# Patient Record
Sex: Male | Born: 1950 | Race: White | Hispanic: No | Marital: Married | Smoking: Former smoker
Health system: Southern US, Community
[De-identification: ages and names within clinical notes are randomized; demographics above are authoritative.]

## PROBLEM LIST (undated history)

## (undated) DIAGNOSIS — M1990 Unspecified osteoarthritis, unspecified site: Secondary | ICD-10-CM

## (undated) DIAGNOSIS — I509 Heart failure, unspecified: Secondary | ICD-10-CM

## (undated) DIAGNOSIS — K2100 Gastro-esophageal reflux disease with esophagitis, without bleeding: Secondary | ICD-10-CM

## (undated) DIAGNOSIS — J449 Chronic obstructive pulmonary disease, unspecified: Secondary | ICD-10-CM

## (undated) DIAGNOSIS — I4892 Unspecified atrial flutter: Secondary | ICD-10-CM

## (undated) DIAGNOSIS — M810 Age-related osteoporosis without current pathological fracture: Secondary | ICD-10-CM

## (undated) DIAGNOSIS — I700 Atherosclerosis of aorta: Secondary | ICD-10-CM

## (undated) DIAGNOSIS — N189 Chronic kidney disease, unspecified: Secondary | ICD-10-CM

## (undated) DIAGNOSIS — N185 Chronic kidney disease, stage 5: Secondary | ICD-10-CM

## (undated) DIAGNOSIS — E785 Hyperlipidemia, unspecified: Secondary | ICD-10-CM

## (undated) DIAGNOSIS — I770 Arteriovenous fistula, acquired: Secondary | ICD-10-CM

## (undated) DIAGNOSIS — I219 Acute myocardial infarction, unspecified: Secondary | ICD-10-CM

## (undated) DIAGNOSIS — I2510 Atherosclerotic heart disease of native coronary artery without angina pectoris: Secondary | ICD-10-CM

## (undated) DIAGNOSIS — Z974 Presence of external hearing-aid: Secondary | ICD-10-CM

## (undated) DIAGNOSIS — E119 Type 2 diabetes mellitus without complications: Secondary | ICD-10-CM

## (undated) DIAGNOSIS — Z950 Presence of cardiac pacemaker: Secondary | ICD-10-CM

## (undated) DIAGNOSIS — R51 Headache: Secondary | ICD-10-CM

## (undated) DIAGNOSIS — J45909 Unspecified asthma, uncomplicated: Secondary | ICD-10-CM

## (undated) DIAGNOSIS — R609 Edema, unspecified: Secondary | ICD-10-CM

## (undated) DIAGNOSIS — R5383 Other fatigue: Secondary | ICD-10-CM

## (undated) DIAGNOSIS — Z973 Presence of spectacles and contact lenses: Secondary | ICD-10-CM

## (undated) DIAGNOSIS — I739 Peripheral vascular disease, unspecified: Secondary | ICD-10-CM

## (undated) DIAGNOSIS — R519 Headache, unspecified: Secondary | ICD-10-CM

## (undated) DIAGNOSIS — D649 Anemia, unspecified: Secondary | ICD-10-CM

## (undated) DIAGNOSIS — I10 Essential (primary) hypertension: Secondary | ICD-10-CM

## (undated) DIAGNOSIS — G4730 Sleep apnea, unspecified: Secondary | ICD-10-CM

## (undated) DIAGNOSIS — N289 Disorder of kidney and ureter, unspecified: Secondary | ICD-10-CM

## (undated) DIAGNOSIS — J189 Pneumonia, unspecified organism: Secondary | ICD-10-CM

## (undated) HISTORY — DX: Hyperlipidemia, unspecified: E78.5

## (undated) HISTORY — PX: COLONOSCOPY W/ BIOPSIES AND POLYPECTOMY: SHX1376

## (undated) HISTORY — PX: BACK SURGERY: SHX140

## (undated) HISTORY — DX: Atherosclerotic heart disease of native coronary artery without angina pectoris: I25.10

## (undated) HISTORY — PX: APPENDECTOMY: SHX54

## (undated) HISTORY — PX: ABDOMINAL SURGERY: SHX537

## (undated) HISTORY — DX: Edema, unspecified: R60.9

## (undated) HISTORY — DX: Morbid (severe) obesity due to excess calories: E66.01

## (undated) HISTORY — PX: INSERT / REPLACE / REMOVE PACEMAKER: SUR710

## (undated) HISTORY — PX: OTHER SURGICAL HISTORY: SHX169

## (undated) HISTORY — PX: HIP ARTHROPLASTY: SHX981

## (undated) HISTORY — PX: TUMOR REMOVAL: SHX12

## (undated) HISTORY — DX: Gastro-esophageal reflux disease with esophagitis, without bleeding: K21.00

## (undated) HISTORY — DX: Unspecified atrial flutter: I48.92

## (undated) HISTORY — PX: GROIN EXPLORATION: SHX1713

## (undated) HISTORY — DX: Type 2 diabetes mellitus without complications: E11.9

## (undated) HISTORY — PX: CARDIAC CATHETERIZATION: SHX172

## (undated) HISTORY — DX: Peripheral vascular disease, unspecified: I73.9

## (undated) HISTORY — PX: CLOSED REDUCTION SHOULDER DISLOCATION: SUR242

## (undated) HISTORY — DX: Chronic kidney disease, unspecified: N18.9

## (undated) HISTORY — PX: CORONARY ANGIOPLASTY: SHX604

## (undated) HISTORY — DX: Essential (primary) hypertension: I10

## (undated) HISTORY — DX: Gastro-esophageal reflux disease with esophagitis: K21.0

## (undated) HISTORY — PX: DIAGNOSTIC LAPAROSCOPY: SUR761

## (undated) HISTORY — DX: Chronic obstructive pulmonary disease, unspecified: J44.9

## (undated) HISTORY — PX: CHOLECYSTECTOMY: SHX55

---

## 1998-03-30 ENCOUNTER — Encounter: Payer: Self-pay | Admitting: Neurosurgery

## 1998-04-06 ENCOUNTER — Inpatient Hospital Stay (HOSPITAL_COMMUNITY): Admission: AD | Admit: 1998-04-06 | Discharge: 1998-04-08 | Payer: Self-pay | Admitting: Cardiovascular Disease

## 1998-09-06 ENCOUNTER — Ambulatory Visit (HOSPITAL_COMMUNITY): Admission: RE | Admit: 1998-09-06 | Discharge: 1998-09-06 | Payer: Self-pay | Admitting: Internal Medicine

## 1998-09-06 ENCOUNTER — Encounter (INDEPENDENT_AMBULATORY_CARE_PROVIDER_SITE_OTHER): Payer: Self-pay | Admitting: Internal Medicine

## 1998-10-13 ENCOUNTER — Ambulatory Visit (HOSPITAL_COMMUNITY): Admission: RE | Admit: 1998-10-13 | Discharge: 1998-10-13 | Payer: Self-pay | Admitting: Gastroenterology

## 1998-10-13 ENCOUNTER — Encounter: Payer: Self-pay | Admitting: Gastroenterology

## 1998-10-25 ENCOUNTER — Inpatient Hospital Stay (HOSPITAL_COMMUNITY): Admission: RE | Admit: 1998-10-25 | Discharge: 1998-10-30 | Payer: Self-pay | Admitting: General Surgery

## 1998-10-26 ENCOUNTER — Encounter: Payer: Self-pay | Admitting: General Surgery

## 2000-06-06 ENCOUNTER — Encounter: Payer: Self-pay | Admitting: Neurosurgery

## 2000-06-10 ENCOUNTER — Inpatient Hospital Stay (HOSPITAL_COMMUNITY): Admission: RE | Admit: 2000-06-10 | Discharge: 2000-06-13 | Payer: Self-pay | Admitting: Neurosurgery

## 2000-06-10 ENCOUNTER — Encounter: Payer: Self-pay | Admitting: Neurosurgery

## 2000-06-20 ENCOUNTER — Encounter: Admission: RE | Admit: 2000-06-20 | Discharge: 2000-06-20 | Payer: Self-pay | Admitting: Neurosurgery

## 2000-06-20 ENCOUNTER — Encounter: Payer: Self-pay | Admitting: Neurosurgery

## 2000-07-10 ENCOUNTER — Emergency Department (HOSPITAL_COMMUNITY): Admission: EM | Admit: 2000-07-10 | Discharge: 2000-07-10 | Payer: Self-pay | Admitting: Internal Medicine

## 2000-07-11 ENCOUNTER — Encounter: Payer: Self-pay | Admitting: Gastroenterology

## 2000-07-11 ENCOUNTER — Encounter: Admission: RE | Admit: 2000-07-11 | Discharge: 2000-07-11 | Payer: Self-pay | Admitting: Gastroenterology

## 2000-07-18 ENCOUNTER — Encounter: Payer: Self-pay | Admitting: General Surgery

## 2000-07-18 ENCOUNTER — Encounter: Admission: RE | Admit: 2000-07-18 | Discharge: 2000-07-18 | Payer: Self-pay | Admitting: General Surgery

## 2001-06-18 ENCOUNTER — Ambulatory Visit (HOSPITAL_COMMUNITY): Admission: RE | Admit: 2001-06-18 | Discharge: 2001-06-18 | Payer: Self-pay | Admitting: *Deleted

## 2002-10-02 ENCOUNTER — Inpatient Hospital Stay (HOSPITAL_COMMUNITY): Admission: EM | Admit: 2002-10-02 | Discharge: 2002-10-04 | Payer: Self-pay | Admitting: Internal Medicine

## 2002-10-03 ENCOUNTER — Encounter: Payer: Self-pay | Admitting: Internal Medicine

## 2002-10-04 ENCOUNTER — Encounter: Payer: Self-pay | Admitting: Internal Medicine

## 2004-08-21 ENCOUNTER — Ambulatory Visit: Payer: Self-pay | Admitting: Cardiology

## 2005-10-03 ENCOUNTER — Ambulatory Visit: Payer: Self-pay | Admitting: Cardiology

## 2006-01-31 ENCOUNTER — Ambulatory Visit: Payer: Self-pay | Admitting: Cardiology

## 2006-02-14 ENCOUNTER — Ambulatory Visit: Payer: Self-pay | Admitting: Cardiology

## 2006-03-11 ENCOUNTER — Ambulatory Visit: Payer: Self-pay | Admitting: Cardiology

## 2006-09-03 ENCOUNTER — Ambulatory Visit: Payer: Self-pay | Admitting: Cardiology

## 2006-10-20 ENCOUNTER — Ambulatory Visit: Payer: Self-pay | Admitting: Cardiology

## 2006-10-28 ENCOUNTER — Encounter: Payer: Self-pay | Admitting: Cardiology

## 2006-10-28 ENCOUNTER — Ambulatory Visit: Payer: Self-pay | Admitting: Cardiology

## 2007-06-04 ENCOUNTER — Encounter: Payer: Self-pay | Admitting: Physician Assistant

## 2007-06-04 ENCOUNTER — Ambulatory Visit: Payer: Self-pay | Admitting: Cardiology

## 2007-09-09 ENCOUNTER — Encounter: Payer: Self-pay | Admitting: Physician Assistant

## 2008-01-08 ENCOUNTER — Ambulatory Visit: Payer: Self-pay | Admitting: Cardiology

## 2008-01-14 ENCOUNTER — Ambulatory Visit: Payer: Self-pay | Admitting: Cardiology

## 2008-02-11 ENCOUNTER — Ambulatory Visit: Payer: Self-pay | Admitting: Cardiology

## 2008-02-19 ENCOUNTER — Ambulatory Visit: Payer: Self-pay | Admitting: Cardiology

## 2008-03-30 ENCOUNTER — Ambulatory Visit: Payer: Self-pay | Admitting: Cardiology

## 2008-03-30 ENCOUNTER — Encounter: Payer: Self-pay | Admitting: Cardiology

## 2008-03-30 DIAGNOSIS — E785 Hyperlipidemia, unspecified: Secondary | ICD-10-CM | POA: Insufficient documentation

## 2008-03-30 DIAGNOSIS — I731 Thromboangiitis obliterans [Buerger's disease]: Secondary | ICD-10-CM | POA: Insufficient documentation

## 2008-03-30 DIAGNOSIS — I2510 Atherosclerotic heart disease of native coronary artery without angina pectoris: Secondary | ICD-10-CM | POA: Insufficient documentation

## 2008-03-30 DIAGNOSIS — E782 Mixed hyperlipidemia: Secondary | ICD-10-CM | POA: Insufficient documentation

## 2008-03-30 DIAGNOSIS — F411 Generalized anxiety disorder: Secondary | ICD-10-CM | POA: Insufficient documentation

## 2008-03-30 DIAGNOSIS — E119 Type 2 diabetes mellitus without complications: Secondary | ICD-10-CM | POA: Insufficient documentation

## 2008-05-20 DIAGNOSIS — K760 Fatty (change of) liver, not elsewhere classified: Secondary | ICD-10-CM

## 2008-05-20 HISTORY — DX: Fatty (change of) liver, not elsewhere classified: K76.0

## 2008-11-17 ENCOUNTER — Ambulatory Visit: Payer: Self-pay | Admitting: Cardiology

## 2008-11-18 ENCOUNTER — Telehealth: Payer: Self-pay | Admitting: Cardiology

## 2008-12-08 ENCOUNTER — Encounter: Admission: RE | Admit: 2008-12-08 | Discharge: 2008-12-08 | Payer: Self-pay | Admitting: Gastroenterology

## 2008-12-26 ENCOUNTER — Encounter: Payer: Self-pay | Admitting: Cardiology

## 2008-12-29 ENCOUNTER — Encounter: Payer: Self-pay | Admitting: Cardiology

## 2009-01-10 ENCOUNTER — Encounter (HOSPITAL_COMMUNITY): Admission: RE | Admit: 2009-01-10 | Discharge: 2009-03-24 | Payer: Self-pay | Admitting: Gastroenterology

## 2009-05-20 ENCOUNTER — Encounter: Payer: Self-pay | Admitting: Cardiology

## 2009-07-05 ENCOUNTER — Ambulatory Visit: Payer: Self-pay | Admitting: Cardiology

## 2009-07-05 DIAGNOSIS — F17200 Nicotine dependence, unspecified, uncomplicated: Secondary | ICD-10-CM | POA: Insufficient documentation

## 2009-07-05 DIAGNOSIS — G4733 Obstructive sleep apnea (adult) (pediatric): Secondary | ICD-10-CM | POA: Insufficient documentation

## 2009-07-05 DIAGNOSIS — J449 Chronic obstructive pulmonary disease, unspecified: Secondary | ICD-10-CM | POA: Insufficient documentation

## 2009-07-19 ENCOUNTER — Encounter: Payer: Self-pay | Admitting: Cardiology

## 2009-07-25 ENCOUNTER — Telehealth (INDEPENDENT_AMBULATORY_CARE_PROVIDER_SITE_OTHER): Payer: Self-pay | Admitting: *Deleted

## 2009-07-30 ENCOUNTER — Encounter: Payer: Self-pay | Admitting: Cardiology

## 2010-02-15 ENCOUNTER — Ambulatory Visit: Payer: Self-pay | Admitting: Cardiology

## 2010-02-15 DIAGNOSIS — R609 Edema, unspecified: Secondary | ICD-10-CM | POA: Insufficient documentation

## 2010-03-28 ENCOUNTER — Encounter (INDEPENDENT_AMBULATORY_CARE_PROVIDER_SITE_OTHER): Payer: Self-pay | Admitting: *Deleted

## 2010-04-10 ENCOUNTER — Telehealth (INDEPENDENT_AMBULATORY_CARE_PROVIDER_SITE_OTHER): Payer: Self-pay | Admitting: *Deleted

## 2010-06-21 NOTE — Assessment & Plan Note (Signed)
Summary: 6 MO FU   Visit Type:  Follow-up Primary Provider:  Dr. Nadara Mustard   History of Present Illness: patient's 60 year old male with history of coronary heart disease, status post stent of the mid RCA in 1999. Had a normal cardiac study 2009. He continues to smoke and is a diabetic. Recently his abscess of the patient's up for nonhealing ulcers of the lower extremities interestingly he has normal arterial blood flow with normal ABIs. He does have severe venous insufficiency although venous Doppler she did not show any thrombosis. Procedure the patient's left leg is very edematous. There appears to be a component of lymphedema also. From a cardiac standpoint he is doing well. He denies any chest pain short of breath orthopnea PND. He has been screened for short sleep apnea and was found to have mild sharp sleep apnea.  Preventive Screening-Counseling & Management  Alcohol-Tobacco     Smoking Status: current     Smoking Cessation Counseling: yes     Packs/Day: 2 PPD  Current Medications (verified): 1)  Clonazepam 0.5 Mg Tabs (Clonazepam) .... Take 1 Tablet By Mouth Two Times A Day 2)  Altace 10 Mg Caps (Ramipril) .... Take 1 Capsule By Mouth Once A Day 3)  Crestor 10 Mg Tabs (Rosuvastatin Calcium) .... Take 1 Tablet By Mouth Once A Day 4)  Norvasc 10 Mg Tabs (Amlodipine Besylate) .... Take 1 Tablet By Mouth Once A Day 5)  Metformin Hcl 500 Mg Tabs (Metformin Hcl) .... Take 1 Tablet By Mouth Once A Day 6)  Lasix 40 Mg Tabs (Furosemide) .... Take 1 Tablet By Mouth Once A Day 7)  Multivitamins  Tabs (Multiple Vitamin) .... Take 1 Tablet By Mouth Once A Day 8)  Aspir-Low 81 Mg Tbec (Aspirin) .... Take 1 Tablet By Mouth Once A Day 9)  Coreg 25 Mg Tabs (Carvedilol) .... Take 1 Tablet By Mouth Two Times A Day 10)  Omeprazole 20 Mg Cpdr (Omeprazole) .... Take 1 Tablet By Mouth Once A Day 11)  Lantus 100 Unit/ml Soln (Insulin Glargine) .... 48u Subcutaneously Daily 12)  Novolog 100 Unit/ml Soln  (Insulin Aspart) .... Sliding Scale  Allergies (verified): No Known Drug Allergies  Comments:  Nurse/Medical Assistant: The patient's medication bottles and allergies were reviewed with the patient and were updated in the Medication and Allergy Lists.  Past History:  Past Medical History: Last updated: 07/05/2009 multivessel coronary artery disease moderate two-vessel coronary disease by catheterization 2003.  Status post stenting of the mid RCA November 1999 normal left ventricular ejection fraction. Normal Cardiolite study last month with a normal ejection fraction.2009 Dyslipidemia type 2 diabetes mellitus hypertension COPD with ongoing tobacco use and the patient failed sham takes. 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  Family History: Last updated: 03/30/2008 noncontributory  Social History: Last updated: 03/30/2008 patient continues to smoke.  Risk Factors: Smoking Status: current (02/15/2010) Packs/Day: 2 PPD (02/15/2010)  Social History: Packs/Day:  2 PPD  Review of Systems       The patient complains of fatigue and leg swelling.  The patient denies malaise, fever, weight gain/loss, vision loss, decreased hearing, hoarseness, chest pain, palpitations, shortness of breath, prolonged cough, wheezing, sleep apnea, coughing up blood, abdominal pain, blood in stool, nausea, vomiting, diarrhea, heartburn, incontinence, blood in urine, muscle weakness, joint pain, rash, skin lesions, headache, fainting, dizziness, depression,  anxiety, enlarged lymph nodes, easy bruising or bleeding, and environmental allergies.    Vital Signs:  Patient profile:   60 year old male Height:      75 inches Weight:      293 pounds Pulse rate:   40 / minute BP sitting:    130 / 81  (left arm) Cuff size:   large  Vitals Entered By: Georgina Peer (February 15, 2010 10:07 AM)  Physical Exam  Additional Exam:  General: Well-developed, well-nourished in no distress head: Normocephalic and atraumatic eyes PERRLA/EOMI intact, conjunctiva and lids normal nose: No deformity or lesions mouth normal dentition, normal posterior pharynx neck: Supple, no JVD.  No masses, thyromegaly or abnormal cervical nodes lungs: Normal breath sounds bilaterally without wheezing.  Normal percussion heart: regular rate and rhythm with normal S1 and S2, no S3 or S4.  PMI is normal.  No pathological murmurs abdomen: Normal bowel sounds, abdomen is soft and nontender without masses, organomegaly or hernias noted.  No hepatosplenomegaly musculoskeletal: Back normal, normal gait muscle strength and tone normal pulsus: Pulse is normal in all 4 extremities Extremities: severe edema more so on the left leg on the right associated with mild pitting edema and lymphedema. Chronic venous insufficiency neurologic: Alert and oriented x 3 skin: Intact without lesions or rashes cervical nodes: No significant adenopathy psychologic: Normal affect    Impression & Recommendations:  Problem # 1:  OBSTRUCTIVE SLEEP APNEA (ICD-327.23) mild obstructive sleep apnea. I offered patient a referral but he stated that is currently not interested. He is not wanting to wear a CPAP mask.  Problem # 2:  GENERALIZED ANXIETY DISORDER (ICD-300.02) the patient still has significant anxiety and increase his clonazepam to 0.5 mg p.o. b.i.d.  Problem # 3:  MORBID OBESITY (ICD-278.01) I counseled the patient regarding weight reduction.  Problem # 4:  CORONARY ATHEROSCLEROSIS NATIVE CORONARY ARTERY (ICD-414.01) no recurrent chest pain. Negative Cardiolite study 2 years ago His updated medication list for this problem includes:    Altace 10 Mg Caps (Ramipril) .Marland Kitchen... Take 1 capsule by mouth once a day    Norvasc  10 Mg Tabs (Amlodipine besylate) .Marland Kitchen... Take 1 tablet by mouth once a day    Aspir-low 81 Mg Tbec (Aspirin) .Marland Kitchen... Take 1 tablet by mouth once a day    Coreg 25 Mg Tabs (Carvedilol) .Marland Kitchen... Take 1 tablet by mouth two times a day  Orders: EKG w/ Interpretation (93000)Future Orders: T-Basic Metabolic Panel (99991111) ... 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  Patient Instructions: 1)  Stop HCTZ 2)  Begin Lasix 40mg  daily 3)  Labs:  BMET - do in 7-10 days 4)  Increase Clonazepam to 0.5mg  two times a day  5)  Follow up in  6 months Prescriptions: CLONAZEPAM 0.5 MG TABS (CLONAZEPAM) Take 1 tablet by mouth two times a day  #60 x 0   Entered by:   Lovina Reach, LPN   Authorized by:   Terald Sleeper, MD, [facility]   Signed by:   Lovina Reach, LPN on 624THL   Method used:   Print then Give to Patient   RxID:   [id number] LASIX 40 MG TABS (FUROSEMIDE) Take 1 tablet by mouth once a day  #30 x 6   Entered by:   Lovina Reach, LPN   Authorized by:   Terald Sleeper, MD, [facility]   Signed by:   Lovina Reach, LPN on 624THL   Method used:   Electronically to  [facility] (retail)       [address]       [place], [place]  [address]       Ph: [phone] or [phone]       Fax: [phone]   RxID:   [id number]

## 2010-06-21 NOTE — Letter (Signed)
Summary: NiteWatch Order for Home Sleep Study  [employer] Order for Home Sleep Study   Imported By: Gurney Maxin, RN, BSN 07/19/2009 13:04:27  _____________________________________________________________________  External Attachment:    Type:   Image     Comment:   External Document

## 2010-06-21 NOTE — Letter (Signed)
Summary: [facility], general practice at [facility]. [address]   [place], [place] [address]   Phone: [phone]  Fax: [phone]        March 28, 2010 MRN: [id number]    [name] [address] [place], [place]  [address]    Dear [name],  On 02/15/2010, our office requested that you have lab work done 7-10 days after starting new fluid medication.  Our records indicate that you have not had the above testing done yet.  Please take the enclosed order to the [facility] at your earliest convenience.    ***  If you do not plan to do this test or your primary physician is managing, please contact our office so that we can document appropriately in your chart.          Sincerely,  Lovina Reach, LPN  This letter has been electronically signed by your physician.

## 2010-06-21 NOTE — Assessment & Plan Note (Signed)
Summary: 6 MO FU PER JAN REMINDER-SRS   Visit Type:  Follow-up Primary Provider:  Dr. Nadara Mustard  CC:  follow-up visit.  History of Present Illness: the patient is a 60 year old male with a history of coronary artery disease status post stenting to the mid RCA in 1999. The patient had a normal Cardiolite study in 2009. Unfortunately continues to smoke. He has not been placed on insulin. He reports an occasional cough. Last month apparently he had tracheobronchitis and was treated for this. He does report loud snoring. He has never been screened for obstructive sleep apnea. The patient denies any chest pain orthopnea PND palpitations or syncope.   Preventive Screening-Counseling & Management  Alcohol-Tobacco     Smoking Status: current     Smoking Cessation Counseling: yes     Packs/Day: 1pk/4 days  Current Medications (verified): 1)  Clonazepam 0.5 Mg Tabs (Clonazepam) .... Take 1 Tablet By Mouth Once A Day 2)  Altace 10 Mg Caps (Ramipril) .... Take 1 Capsule By Mouth Once A Day 3)  Crestor 10 Mg Tabs (Rosuvastatin Calcium) .... Take 1 Tablet By Mouth Once A Day 4)  Norvasc 10 Mg Tabs (Amlodipine Besylate) .... Take 1 Tablet By Mouth Once A Day 5)  Metformin Hcl 500 Mg Tabs (Metformin Hcl) .... Take 1 Tablet By Mouth Once A Day 6)  Hydrochlorothiazide 25 Mg Tabs (Hydrochlorothiazide) .... Take 1 Tablet By Mouth Once A Day 7)  Multivitamins  Tabs (Multiple Vitamin) .... Take 1 Tablet By Mouth Once A Day 8)  Aspir-Low 81 Mg Tbec (Aspirin) .... Take 1 Tablet By Mouth Once A Day 9)  Coreg 25 Mg Tabs (Carvedilol) .... Take 1 Tablet By Mouth Two Times A Day 10)  Omeprazole 20 Mg Cpdr (Omeprazole) .... Take 1 Tablet By Mouth Once A Day 11)  Lantus 100 Unit/ml Soln (Insulin Glargine) .... 55u Subcutaneously Daily 12)  Novolog 100 Unit/ml Soln (Insulin Aspart) .... Sliding Scale  Allergies (verified): No Known Drug Allergies  Comments:  Nurse/Medical Assistant: The patient's medications and  allergies were reviewed with the patient and were updated in the Medication and Allergy Lists. List reviewed.  Past History:  Family History: Last updated: 03/30/2008 noncontributory  Social History: Last updated: 03/30/2008 patient continues to smoke.  Risk Factors: Smoking Status: current (07/05/2009) Packs/Day: 1pk/4 days (07/05/2009)  Past Medical History: Reviewed history from 07/05/2009 and no changes required. multivessel coronary artery disease moderate two-vessel coronary disease by catheterization 2003.  Status post stenting of the mid RCA November 1999 normal left ventricular ejection fraction. Normal Cardiolite study last month with a normal ejection fraction.2009 Dyslipidemia type 2 diabetes mellitus hypertension COPD with ongoing tobacco use and the patient failed sham takes. 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  Social History: Packs/Day:  1pk/4 days  Review of Systems       The patient complains of prolonged cough.  The patient denies fatigue, malaise, fever, weight gain/loss, vision loss, decreased hearing, hoarseness, chest pain, palpitations, shortness of breath, wheezing, sleep apnea, coughing up blood, abdominal pain, blood in stool, nausea, vomiting, diarrhea, heartburn, incontinence, blood in urine, muscle weakness, joint pain, leg swelling, rash, skin lesions, headache, fainting, dizziness, depression, anxiety, enlarged lymph nodes, easy bruising or bleeding, and environmental allergies.    Vital Signs:  Patient profile:   60 year old male Height:  75 inches Weight:      310 pounds BMI:     38.89 Pulse rate:   70 / minute BP sitting:   129 / 75  (left arm) Cuff size:   large  Vitals Entered By: Georgina Peer (July 05, 2009 2:05 PM)  Nutrition Counseling: Patient's BMI is greater than 25 and therefore counseled on weight management options. CC: follow-up visit   Physical Exam  Additional Exam:  General: Well-developed, well-nourished in no distress head: Normocephalic and atraumatic eyes PERRLA/EOMI intact, conjunctiva and lids normal nose: No deformity or lesions mouth normal dentition, normal posterior pharynx neck: Supple, no JVD.  No masses, thyromegaly or abnormal cervical nodes lungs: Normal breath sounds bilaterally without wheezing.  Normal percussion heart: regular rate and rhythm with normal S1 and S2, no S3 or S4.  PMI is normal.  No pathological murmurs abdomen: Normal bowel sounds, abdomen is soft and nontender without masses, organomegaly or hernias noted.  No hepatosplenomegaly musculoskeletal: Back normal, normal gait muscle strength and tone normal pulsus: Pulse is normal in all 4 extremities Extremities: No peripheral pitting edema neurologic: Alert and oriented x 3 skin: Intact without lesions or rashes cervical nodes: No significant adenopathy psychologic: Normal affect    Impression & Recommendations:  Problem # 1:  CORONARY ATHEROSCLEROSIS NATIVE CORONARY ARTERY (ICD-414.01) the patient reports no recurrent chest pain. He can continue his current medical regimen. The following medications were removed from the medication list:    Isosorbide Mononitrate Cr 30 Mg Xr24h-tab (Isosorbide mononitrate) .Marland Kitchen... Take 1 tablet by mouth once a day His updated medication list for this problem includes:    Altace 10 Mg Caps (Ramipril) .Marland Kitchen... Take 1 capsule by mouth once a day    Norvasc 10 Mg Tabs (Amlodipine besylate) .Marland Kitchen... Take 1 tablet by mouth once a day    Aspir-low 81 Mg Tbec (Aspirin) .Marland Kitchen... Take 1 tablet by mouth once a day    Coreg 25 Mg Tabs (Carvedilol) .Marland Kitchen... Take 1 tablet by mouth two times a day  Problem # 2:  BUERGER'S DISEASE (ICD-443.1) Assessment: Comment  Only  Problem # 3:  TOBACCO ABUSE (ICD-305.1) I counseled the patient regarding his tobacco use.  Problem # 4:  OBSTRUCTIVE SLEEP APNEA (ICD-327.23) the patient will be placed on the waiting list for a Nightwatch screening monitor for obstructive sleep apnea.  Patient Instructions: 1)  Will be put on wait list for Night Watch to rule out obstructive sleep apnea. 2)  Follow up in  6 months.  Appended Document: Orders Update    Clinical Lists Changes  Orders: Added new Referral order of Sleep Study (Sleep Study) - Signed

## 2010-06-21 NOTE — Assessment & Plan Note (Signed)
Summary: Cardiology Office Visit   Visit Type:  Follow-up PCP:  Dr. Nadara Mustard  Chief Complaint:  chest pain.  History of Present Illness: patient presents today for follow-up office stress test.  The patient had a nuclear imaging study the one 2009 there is patient was able to walk for 5 minutes and 28 seconds.  He was very short of breath but he did achieve hundred and 6% of maximum predicted heart rate.  EKG was normal.  Denied any substernal chest pain.  Perfusion images also revealed normal limits with an ejection fraction of 55%.  The study was interpreted by Dr.McDowell. Patient state that the time she feels "twinges in his chest" he also has some numbness and tightness running down the right arm.  He has gained a significant amount of weight over the last several weeks.  He continues to smoke.  He has a general sensation of severe and not something that may happen.  He has also been under significant stress due to a separation with being on on for years.  The patient has problems with ruminating at night.  He states he get tired easily but is able to walk up a flight of stairs without significant difficulty he reports no heavy substernal chest pressure or angina for that matter. The patient continues to have significant lower extremity edema secondary to venous insufficiency.  He also has Buerger's disease secondary to peripheral vascular disease he reports claudication.    Prior Medications Reviewed Using: Medication Bottles    Past Medical History:    multivessel coronary artery disease    moderate two-vessel coronary disease by catheterization 2003.  Status post stenting of the mid RCA November 1999    normal left ventricular ejection fraction.    Normal Cardiolite study last month with a normal ejection fraction.    Dyslipidemia    type 2 diabetes mellitus    hypertension    COPD with ongoing tobacco use and the patient failed sham takes.    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   Family History:    noncontributory  Social History:    patient continues to smoke.   Risk Factors:  Tobacco use:  current    Review of Systems  CV      Complains of chest pain at rest.      atypical symptoms  Resp      Complains of excessive snoring.  Psych      Complains of anxiety.   Vital Signs:  Patient Profile:   60 Years Old Male Weight:      322 pounds Pulse rate:   78 / minute BP sitting:   158 / 85  (left arm)                  Physical Exam  General:     Well developed, well nourished, in no acute distress. Head:     normocephalic and atraumatic Eyes:     PERRLA/EOM intact; conjunctiva and lids normal. Ears:     TM's intact and clear with normal canals and hearing Nose:     no deformity, discharge, inflammation, or lesions Mouth:     Teeth, gums and palate normal. Oral mucosa normal. Neck:     Neck supple, no JVD. No masses, thyromegaly or abnormal  cervical nodes. Chest Wall:     no deformities or breast masses noted Lungs:     Clear bilaterally to auscultation and percussion. Heart:     regular rate and rhythm with normal S1-S2 no pathological murmurs PMI was difficult to palpate. Abdomen:     distended.  No rebound or guarding.  Good bowel sounds present Msk:     Back normal, normal gait. Muscle strength and tone normal. Pulses:     pulses are absent in dorsalis pedis and posterior tibial pulses.  Bilaterally Extremities:     No clubbing or cyanosis.there is two to 3+ dependent peripheral edema. Neurologic:     Alert and oriented x 3.nonfocal exam Skin:     Intact without lesions or rashes. Psych:     depressed affect and anxious.     Impression & Recommendations:  Problem # 1:  CORONARY  ATHEROSCLEROSIS NATIVE CORONARY ARTERY (ICD-414.01) patient complains of atypical chest pain.  He describes his chest pain as twinges.  He has known nonobstructive coronary artery disease.  He had a recent stress test done earlier this month.  A modest exercise tolerance but there were no perfusion defects.  His ejection fraction was 53%.  I reassured the patient does not think is unlikely that his chest pain are present ischemia.  He can use p.r.n. nitroglycerin however but I do feel the episodes are so short-lived but it's probably not necessary.  The patient however has a sensation ofear and is concerned that another heart attack is just around a corner.  I have offered him to proceed with cardiac catheterization but he has declined this.  I also told them that cardiac catheterization is not necessarily predictive of a future myocardial infarction if he continues to smoke and does not appear to any lifestyle changes.  Problem # 2:  BUERGER'S DISEASE (ICD-443.1) stable  Problem # 3:  HYPERLIPIDEMIA (ICD-272.4) this can be followed by the patient's primary care physician.  He is currently on Crestor.  Problem # 4:  GENERALIZED ANXIETY DISORDER (ICD-300.02) the patient Clinitest significant anxiety which is likely contributing to his chest pain and other somatic complaints. He has been under a lot of stress.  Is going through a separation that has been going on for several years.  He has significant ruminating thoughts.  We performed a Hamilton anxiety rating scale in the office today.  He scored in the moderate to severe range for anxiety.  I told the patient that he can discuss this with Dr. Nadara Mustard or I could offer him to get him started on medications.  The patient has opted for the latter but states that he will discuss this next week for her with Dr. Nadara Mustard.  We have prescribed buspirone and clonazepam.  Medications Added to Medication List This Visit: 1)  Buspirone Hcl 7.5 Mg Tabs (Buspirone hcl)  .... One tablet p.o. b.i.d. 2)  Clonazepam 0.5 Mg Tabs (Clonazepam) .... Tablet p.o. b.i.d.   Patient Instructions: 1)    The best the patient to discuss his anxiety disorder and additional treatment with Dr. Nadara Mustard as soon as possible.  I stressed to the patient that he has had here to lifestyle modifications particular diet and exercise.  He also needs to stop smoking. 2)  Please schedule a follow-up appointment in 6 months. 3)  Discussed importance of regular exercise and recommended starting or continuing a regular exercise program for good health. 4)  The patient was encouraged to lose weight for better health.    ]

## 2010-06-21 NOTE — Progress Notes (Signed)
Summary: VOICEMAIL MESSAGE  Phone Note Call from Patient Call back at [phone]   Caller: MESSAGE LEFT ON VOICEMAIL Summary of Call: Was never able to get this blood work done.  Has had several foot surgeries and other things going on.  Now has wound vac and pic line for ABO.   Initial call taken by: Lovina Reach, LPN,  November 22, 624THL 2:27 PM

## 2010-06-21 NOTE — Progress Notes (Signed)
Summary: NiteWatch Question  Phone Note Other Incoming Call back at [phone] ext [phone]   Caller: Dorothy at [employer] Summary of Call: Earlie Server with [employer] called regarding pt's order for NiteWatch. She states she spoke with the patient who told her that he was under the impression that he wouldn't have to pay for the monitor due to some type of 3 month trial. Notified Dorothy that we are not aware of any trial that would eliminate his charge. Dr. Lutricia Feil confirmed that he did not tell the patient there would be no charge but told him that [place] would get pre-cert from his insurance.   Attempted to reach patient to discuss further and left message on cell phone [phone]). Earlie Server states she would attempt to reach patient tomorrow after we had time to speak with him. Initial call taken by: Gurney Maxin, RN, BSN,  July 25, 2009 3:14 PM  Follow-up for Phone Call        Pt notified there is no 3 month trial with [employer] that we are aware of . Pt states he thought this is what he was told but maybe not. Pt states his Primary Insurance will pay 90% and Medicare may cover the remaining 10%. Pt understands that he will be responsible for any portion not covered by insurance. Pt is aware that Dorothy with [employer] will call him back today regarding monitor.  Follow-up by: Gurney Maxin, RN, BSN,  July 26, 2009 9:54 AM

## 2010-06-21 NOTE — Progress Notes (Signed)
Summary: taking med  Phone Note Call from Patient   Summary of Call: Was told to call office back.  States he is taking his Crestor. Initial call taken by: Lovina Reach, LPN,  July  2, 624THL 624THL PM

## 2010-07-23 ENCOUNTER — Encounter: Payer: Self-pay | Admitting: Licensed Clinical Social Worker

## 2010-07-24 ENCOUNTER — Encounter (INDEPENDENT_AMBULATORY_CARE_PROVIDER_SITE_OTHER): Payer: Self-pay | Admitting: *Deleted

## 2010-07-24 DIAGNOSIS — M86179 Other acute osteomyelitis, unspecified ankle and foot: Secondary | ICD-10-CM | POA: Insufficient documentation

## 2010-07-26 ENCOUNTER — Encounter (INDEPENDENT_AMBULATORY_CARE_PROVIDER_SITE_OTHER): Payer: Self-pay | Admitting: *Deleted

## 2010-07-31 NOTE — Miscellaneous (Signed)
Summary: med update  Clinical Lists Changes  Medications: Added new medication of AUGMENTIN 500-125 MG TABS (AMOXICILLIN-POT CLAVULANATE) take one tablet twice a day l Added new medication of BACTRIM DS 800-160 MG TABS (SULFAMETHOXAZOLE-TRIMETHOPRIM) Take 1 tablet by mouth two times a day

## 2010-07-31 NOTE — Miscellaneous (Signed)
  Clinical Lists Changes  Problems: Added new problem of OSTEOMYELITIS, ACUTE, ANKLE/FOOT (ICD-730.07)

## 2010-08-07 ENCOUNTER — Telehealth: Payer: Self-pay | Admitting: *Deleted

## 2010-08-07 NOTE — Telephone Encounter (Signed)
Albina Billet from Dr. Debroah Baller office at [facility] called asking for an appt for this pt today. They just got back report taken from a wound that is growing diptheroid corynedacterium. He is a lot of pain & it is disrupting his sleep and activities. Told her we did not have anything & advised sending him to [facility] ED.  She will contact pt.Aileen Pilot

## 2010-08-13 ENCOUNTER — Ambulatory Visit: Payer: Self-pay | Admitting: Infectious Diseases

## 2010-08-14 ENCOUNTER — Ambulatory Visit (INDEPENDENT_AMBULATORY_CARE_PROVIDER_SITE_OTHER): Payer: Medicare Other | Admitting: Infectious Diseases

## 2010-08-14 ENCOUNTER — Encounter: Payer: Self-pay | Admitting: Infectious Diseases

## 2010-08-14 DIAGNOSIS — I82409 Acute embolism and thrombosis of unspecified deep veins of unspecified lower extremity: Secondary | ICD-10-CM

## 2010-08-14 DIAGNOSIS — I10 Essential (primary) hypertension: Secondary | ICD-10-CM

## 2010-08-14 DIAGNOSIS — M86179 Other acute osteomyelitis, unspecified ankle and foot: Secondary | ICD-10-CM

## 2010-08-14 MED ORDER — AMLODIPINE BESYLATE 10 MG PO TABS: 10.0000 mg | ORAL_TABLET | Freq: Every day | ORAL | Status: DC

## 2010-08-14 MED ORDER — WARFARIN SODIUM 5 MG PO TABS: 5.0000 mg | ORAL_TABLET | Freq: Every day | ORAL | Status: DC

## 2010-08-14 NOTE — Assessment & Plan Note (Signed)
Spoke with him at length, would 1) d/c erythromycin. Staph gains resistance to this fairly easily. 2) start IV anbx if he agrees. It offers the best chance for him to achieve cure, follow this with po anbx for prolonged period. 3) cont wound vac. Return to clinic in 4 weeks.   I discussed with him that he has several factors to fight against in this battle- DM, HTN, CAD, probable venous insufficiency, prev MI and resuting arterial disease, smoking. He is aware he need to control his Hospers for this therapy to have a chance to succeed. He is also aware he needs to stop smoking. He does not wish to loose his foot.

## 2010-08-14 NOTE — Progress Notes (Signed)
  Subjective:    Patient ID: [name], male    DOB: [birth year], 60 y.o.   MRN: [id number]  HPI 59 yo M with hx of HTN, MI with stent (99), DM2 (~7 years, on insulin for 2 years).  March 2011, he noted blisters on his R foot.  Had blisters lanced by his MD and eventually partial amputation of R second toe, fusion (with a screw) of the interphalangeal joint of L hallux on December 21, 2009. 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 His L 5th metatarsal was resected March 07, 2010(Cx alpha strep) and he then developed a non-healing wound. He had a wound vac placed 03-29-10. He was then started on IV vancomycin 04-03-10. This was discontinued on 05-11-10 when he developed a RUE DVT that extended to his neck. His PIC was removed. He is still on coumadin at this point for a planned  months.  He has since undergone tagged WBC study showing osteomyelitis in his residual 5th metatarsal bone. He underwent L 5th partial ray amputation on 07-06-10. The margin of this showed no osteo. His Cx showed MRSA (sens-vanco, gent, clinda, rif, tigecycline, bactrim) and as placed on Bactrim. He had erythromicin added on 08-07-10 as well.  Ability to bear wt is normal. No fevers or chills, d/c from his wound is clear. He has LE neuropathy. No problems with the bactrim with the exception of mild GI upset.   Review of Systems  Constitutional: Negative for fever and unexpected weight change.  FSG runs 60s-150s. +Neuropathy. No vision changes.      Objective:   Physical Exam  Constitutional: He appears well-developed.  Eyes: EOM are normal. Pupils are equal, round, and reactive to light.  Neck: Neck supple.  Cardiovascular: Normal rate, regular rhythm and normal heart sounds.   Abdominal: Soft. Bowel sounds are normal.  Musculoskeletal:       Feet:          Assessment & Plan:

## 2010-08-14 NOTE — Progress Notes (Signed)
Addended byJohnnye Sima, Kelcey Korus on: 08/14/2010 04:44 PM   Modules accepted: Orders

## 2010-08-17 ENCOUNTER — Ambulatory Visit (HOSPITAL_COMMUNITY)
Admission: RE | Admit: 2010-08-17 | Discharge: 2010-08-17 | Disposition: A | Discharge status: Home / Self Care | Payer: Medicare Other | Source: Ambulatory Visit | Attending: Infectious Diseases | Admitting: Infectious Diseases

## 2010-08-17 ENCOUNTER — Other Ambulatory Visit: Payer: Self-pay | Admitting: Infectious Diseases

## 2010-08-17 DIAGNOSIS — M86179 Other acute osteomyelitis, unspecified ankle and foot: Secondary | ICD-10-CM

## 2010-08-17 DIAGNOSIS — M869 Osteomyelitis, unspecified: Secondary | ICD-10-CM | POA: Insufficient documentation

## 2010-09-06 ENCOUNTER — Telehealth: Payer: Self-pay | Admitting: *Deleted

## 2010-09-06 NOTE — Telephone Encounter (Signed)
States he is on Vanco. This am he noticed that his toes on R foot (3rd, 4th & 5th) are blistered. Home care rn has been out to him to see the toes. Has appt with Dr. [name] in [place] tomorrow. He popped the blisters & the rn applied betadine & wrapped the toes. Leg is not swollen.  Wound on L foot is healing well.  Asked that he sign a ROI when he is at the md tomorrow so we can get a copy of the visit notes faxed here

## 2010-09-20 ENCOUNTER — Encounter: Payer: Self-pay | Admitting: Infectious Diseases

## 2010-09-21 ENCOUNTER — Encounter: Payer: Self-pay | Admitting: Infectious Diseases

## 2010-09-25 ENCOUNTER — Telehealth: Payer: Self-pay | Admitting: *Deleted

## 2010-09-25 ENCOUNTER — Other Ambulatory Visit: Payer: Self-pay | Admitting: *Deleted

## 2010-09-25 DIAGNOSIS — M86179 Other acute osteomyelitis, unspecified ankle and foot: Secondary | ICD-10-CM

## 2010-09-25 MED ORDER — SULFAMETHOXAZOLE-TMP DS 800-160 MG PO TABS: 1.0000 | ORAL_TABLET | Freq: Two times a day (BID) | ORAL | Status: DC

## 2010-09-25 NOTE — Telephone Encounter (Addendum)
States his last dose of vanco was 4/30. Wound vac came off 2 weeks ago. Cultures are ok per pt. States he got blood clots last time the PICC was left in too long. Sees foot surgeon tomorrow. RN from [facility] comes on Friday. Wants order to have it pulled. Told him I will check with md & call him cell-[phone] _____________________________________________________________- Would pull pic and have pt restart his previous bactrim until he f/u in ID clinic. thanks

## 2010-09-28 NOTE — Telephone Encounter (Signed)
Phone call to [facility] pharmacy.  Given order to d/c [facility].  Call to pt.  [place] RN present in home at this phone call.  Spoke with RN and relayed order to d/c PICC.  RN verbalized understanding.  Pt restarted taking Bactrim rx 09/27/2010.  Pt has f/u appt w/ Dr. Johnnye Sima 11/07/10 which he verbalized.  He has a f/u appt for coumadin f/u next week.  Lorne Skeens, RN

## 2010-10-02 NOTE — Assessment & Plan Note (Signed)
[place] OFFICE NOTE   [name], [name]                      MRN:          [id number]  DATE:06/04/2007                            DOB:          1951-05-05    PRIMARY CARDIOLOGIST:  Dr. Terald Sleeper.   REASON FOR VISIT:  A 30-month follow-up.   The patient returns to the clinic reporting no interim development of  signs/symptoms suggestive of unstable angina pectoris.  He is compliant  with his medications.  Unfortunately, he continues to smoke.   The patient is followed by Dr. Blanch Media of the [facility], for treatment  of diabetic foot ulcers.   The patient has inquired into the use of Viagra, which he has used in  the past without complications.   When last seen in the clinic, he was referred for a follow-up 2-D echo.  This showed improved LV function (55 - 60%), with mild mitral  regurgitation.   Blood pressure medications were titrated, and the patient had stable,  albeit mild, prerenal azotemia with creatinine of 1.4.  The patient was  also started on supplemental potassium and this normalized to a level of  3.8.   Electrocardiogram today reveals ventricular bigeminy approximately 70  bpm.  There are no acute changes.   MEDICATIONS:  Altace 20 mg daily.  Hydrochlorothiazide 25 mg daily.  K-  Dur 20 mg daily.  Metformin 100 mg b.i.d. Glucotrol XL 10 mg daily.  Aspirin 81 mg daily.  Caduet 5/20 daily.  Prilosec 20 daily.  Coreg 25  mg b.i.d.   PHYSICAL EXAMINATION:  Blood pressure 161/91, pulse 67, regular weight  300.6, sats 96% room air.  GENERAL:  60 year old male sitting upright in no distress.  HEENT:  Normocephalic, atraumatic.  NECK:  Palpable carotid pulses without bruits.  LUNGS:  Diminished breath sounds at bases, without crackles or wheezes.  HEART:  Regular rate and rhythm (S1, S2) no significant murmurs.  No  rubs.  ABDOMEN:  Protuberant, nontender, intact bowel sounds.  EXTREMITIES:  Chronic, 2+ bilateral non pitting edema.  NEURO:  No focal deficit.   IMPRESSION:  1. Ischemic cardiomyopathy.      a.     Improved LV (55 - 60%) by 2-D echo, June 2008.      b.     EF 43% by perfusion imaging in 2004.      c.     Status post stent RCA in 1999.      d.     Non-ischemic adenosine Cardiolite; EF 46%, September 2007.  2. Chronic obstructive pulmonary disease/ongoing tobacco.  3. Type 2 diabetes mellitus.  4. Hypertension.  5. Peripheral vascular disease.      a.     History of a toe amputation, secondary to Buerger's disease.  6. Morbid obesity.  7. Chronic lower extremity edema.  8. Dyslipidemia.  9. Mild, chronic renal insufficiency.   PLAN:  Adjust current medication regimen as follows:  1. Finish current supply of Caduet, then start amlodipine 10 mg daily      and Crestor 10 mg daily.  The  amlodipine is for more aggressive      blood pressure control, while the Crestor is to better manage his      dyslipidemia, with previously documented markedly low HDL (17) in      March 2008.  Of note, his LDL at that time was 71, however.  I feel      that Crestor would be much better for this particular profile, with      its reported improved profile with respect to raising HDL levels.  2. The patient once again counseled to stop smoking tobacco, if not at      least significantly reduce his current use.  3. Patient advised to increase his level of exercise.  I understand      that he has recurrent foot ulcers and I have therefore advised him      to try a recumbent bicycle, or water aerobics, as a better option      for him.  Nevertheless, he needs to lose weight which will also      help his blood pressure profile.  4. The patient will be given a short course of Viagra to be used      p.r.n..  He states that this was prescribed for him by [place], in the      past, with no noted complications.  We once again cautioned him      regarding the use of nitrates  within 24 hours of using Viagra.  He      is currently not on any long-acting nitrate.  I will also provided      with a new prescription for p.r.n. nitroglycerin, given that this      has not been renewed in quite some time.  5. Check follow-up fasting lipid/liver profile in 12 weeks, for      assessment of effectiveness of Crestor.  6. Schedule return clinic follow-up with myself and Dr. Dannielle Burn in 6      months.  7. Ventricular bigeminy.      Albert Serpe, PA-C  Electronically Signed      Ernestine Mcmurray, MD,FACC  Electronically Signed   GS/MedQ  DD: 06/04/2007  DT: 06/04/2007  Job #: [id number]   cc:   [name]

## 2010-10-02 NOTE — Assessment & Plan Note (Signed)
[place] OFFICE NOTE   [name], [name]                        MRN:          [id number]  DATE:11/17/2008                            DOB:          1950/11/25    HISTORY PRESENT ILLNESS:  The patient is a 60 year old male with a  history of coronary artery disease status post prior stenting.  He has  normal LV function.  His most recent Cardiolite was within normal  limits.  He continues to smoke, but has cut down to half of his usual  regime.  He is losing quite a bit of weight and went down from 322 to  __________.  Apparently, he has a history of carcinoid tumor, said his  CT scan was negative.  He stated also protein was found in his blood  when he did a 24-hour urine.  He also has difficulty with swallowing and  foods getting hung up in his esophagus or stomach.  There is concern of  functional gastrointestinal disorder with gastroparesis, and the patient  is in the process of having this worked up.  From a cardiac standpoint,  however, he is doing stable.  He has no chest pain.  He has stable  shortness of breath on exertion related to his deconditioning and  weight.  He has no unstable angina symptoms, palpitations, or syncope.   MEDICATIONS:  1. Metformin 500 mg 2 tablets p.o. b.i.d.  2. Hydrochlorothiazide 25 mg p.o. daily.  3. Crestor 10 mg p.o. daily.  4. Multivitamin daily.  5. Aspirin 81 mg p.o. daily.  6. Coreg 25 mg p.o. b.i.d.  7. Glipizide 10 mg p.o. daily.  8. Altace 10 mg p.o. daily.  9. Amlodipine 10 mg p.o. daily.  10.Omeprazole daily.  11.Imdur 30 daily.  12.Glycopyrrolate 2 mg p.r.n.  13.Clonazepam 0.5 p.o. p.r.n.   PHYSICAL EXAMINATION:  VITAL SIGNS:  Blood pressure 130/70, heart rate  70 beats per minute, weight __________ pounds.  GENERAL:  Obese white male, but in no apparent distress.  HEENT:  Pupils are isocoric.  Conjunctivae are clear.  NECK:  Supple.  Normal carotid  upstroke.  No carotid bruits.  LUNGS:  Clear breath sounds bilaterally.  HEART:  Regular rate and rhythm.  Normal S1 and S2.  No murmur, rubs or  gallops.  ABDOMEN:  Soft, nontender. No rebound or guarding.  Good bowel sounds.  EXTREMITIES:  No cyanosis or clubbing.  There is 1+ peripheral pitting  edema.   PROBLEM LIST:  1. Coronary artery disease status post stent to the right coronary      artery in November 1999.  2. Normal left ventricular function.  3. Normal Cardiolite in 2009.  4. Chronic obstructive pulmonary disease with tobacco use.  5. Morbid obesity.  6. Rule out carcinoid tumors.  7. Weight loss, followed by Dr. Oletta Lamas.  8. Peripheral vascular disease toe amputation secondary to Buerger      disease.  9. Anxiety disorder.  10.Reflux esophagitis.   PLAN:  1. From a cardiovascular standpoint, the patient is stable, and I have  made no changes in his medical regimen.  2. From the anxiety perspective, he appears to be doing well with the      clonazepam and he can further discuss this with Dr. Nadara Mustard.  3. I told the patient to send the reports regarding his GI workup to      our office as this may have some relevance in our future therapy      with this patient.      Ernestine Mcmurray, MD,FACC  Electronically Signed    GED/MedQ  DD: 11/17/2008  DT: 11/18/2008  Job #: [id number]   cc:   Joellen Jersey Rolla Flatten., M.D.

## 2010-10-02 NOTE — Assessment & Plan Note (Signed)
[place] OFFICE NOTE   TAKARI, RAVENS                        MRN:          [id number]  DATE:02/11/2008                            DOB:          March 08, 1951    PRIMARY CARDIOLOGIST:  Ernestine Mcmurray, MD, [facility]   REASON FOR VISIT:  Scheduled followup.  Please refer to Dr. Ron Parker' recent  office note of January 08, 2008, for full details.  At that time, Mr.  Weigman was referred for a 2-D echo for reassessment of LV function.  This showed normal LV (EF 55-60%).  Additionally, there was normal wall  motion and no significant valvular abnormalities.   Clinically, Mr. Masin does not report any chest discomfort related to  exertion or strenuous activity.  He refers to sudden, sharp left-sided  twinges which are extremely brief in duration.  They do not occur with  exertion.  He does, however, suggest chronic exertional dyspnea.  However, he has gained 7 pounds, does not exercise regularly, and  continues to smoke.   CURRENT MEDICATIONS:  1. Altace 10 daily.  2. Amlodipine 10 daily.  3. Glipizide 10 daily.  4. Coreg 25 b.i.d.  5. Aspirin 81 daily.  6. Crestor 10 daily.  7. Hydrochlorothiazide 25 daily.  8. Metformin 1000 b.i.d.  9. Omeprazole.   PHYSICAL EXAMINATION:  VITAL SIGNS:  Blood pressure 146/80, pulse 70,  regular, weight 317 (up 7).  GENERAL:  A 60 year old male, morbidly obese, sitting upright, in no  distress.  HEENT:  Normocephalic, atraumatic.  PERRLA.  EOMI.  NECK:  Palpable carotid pulse without bruits; unable to assess JVD,  secondary to neck girth.  LUNGS:  Clear to auscultation in all fields.  HEART:  Regular rate and rhythm.  No significant murmurs.  ABDOMEN:  Protuberant, with intact bowel sounds.  EXTREMITIES:  Chronic 2+ bilateral lower extremity edema (left greater  than right).  No significant venous varicosity.  NEURO:  No focal deficit.   IMPRESSION:  1. Multivessel coronary  artery disease.      a.     Moderate three-vessel CAD by cardiac catheterization in       2003, treated medically.      b.     Status post stenting of mid RCA, November 1999.      c.     Normal LVF.      d.     Low-risk adenosine Cardiolite; EF 46%, September 2007.  2. Dyslipidemia.  3. Type 2 diabetes mellitus.  4. Hypertension.  5. COPD, ongoing tobacco.      a.     Failed Chantix.  6. Morbid obesity.  7. Chronic lower extremity edema.  8. Chronic renal insufficiency, with history of exacerbation.  9. History of mild reflux esophagitis.  10.Peripheral vascular disease.      a.     History of toe amputation, secondary to Buerger's disease.   PLAN:  1. Schedule exercise stress Cardiolite for risk stratification.  If      this suggests any significant change from his last study, then we  will strongly consider a re-look cardiac catheterization.  The      patient is quite agreeable with this plan.  Of note, this study is      to be performed on full medical therapy.  2. Recommend cardiac rehabilitation.  Mr. Eshbach does not exercise on      a regular basis and I strongly encouraged him to engage in a      regular program.  3. Add Imdur 30 mg daily.  I am concerned that the patient's easy      fatigability may be secondary to ischemia.  Therefore, we will see      if this may help ameliorate his symptoms.  As I had done in the      past, I cautioned him against using Viagra or Cialis if he has      taken nitroglycerin within a 24-hour time frame.  The patient, once      again, indicated that he understands.  4. Schedule return clinic followup with myself and Dr. Dannielle Burn in 4-6      weeks, for review of stress test results and further      recommendations.      Gene Serpe, PA-C  Electronically Signed      Ernestine Mcmurray, MD,FACC  Electronically Signed   GS/MedQ  DD: 02/11/2008  DT: 02/12/2008  Job #: [id number]   cc:   [name]

## 2010-10-02 NOTE — Assessment & Plan Note (Signed)
[place] OFFICE NOTE   NATURE, BRAVERMAN                      MRN:          [id number]  DATE:10/20/2006                            DOB:          06-May-1951    HISTORY OF PRESENT ILLNESS:  The patient is a pleasant 60 year old male  with multiple cardiac risk factors and known coronary artery disease, he  was evaluated on September 03, 2006.  At that time he reported palpitations,  increased shortness of breath, and very atypical, brief lasting chest  pain.  The patient presented with significant hypertension during this  office visit with a blood pressure of 187/94.  I felt that this  predominant problem was hypertensive heart disease.  We increased his  Lasix to twice a day and upped his Altace.  In the interim his renal  function has slightly deteriorated with a creatinine of 1.4 and a BUN of  18, his potassium was 3.1 and potassium supplements were called in.   The patient states that overall he is doing quite well.  He has less  shortness of breath.  His blood pressure is under better control today  at 154/92.  He is noted to have very insignificant proteinuria and is  being evaluated by Dr. Nadara Mustard for this.  He has had no recurrent  substernal chest pain.   MEDICATIONS:  1. Altace 10 mg p.o. daily.  2. K-Dur 20 mEq p.o. daily.  3. Lasix 40 mg p.o. b.i.d.  4. Coreg 25 mg 1/2 tablet p.o. b.i.d.  5. Metformin 500 mg p.o. daily.  6. Caduet 5/20 daily.  7. Centrum Silver.  8. Aspirin 81 mg a day.  9. Glucotrol XL 10 mg p.o. daily.   PHYSICAL EXAMINATION:  VITAL SIGNS:  Blood pressure 154/92, heart rate  59, weight is 292 pounds.  NECK:  Normal carotid upstroke, normal carotid bruits.  LUNGS:  Clear breath sounds bilaterally.  HEART:  Regular rate and rhythm, normal S1-S2; no murmur, rubs, or  gallops.  ABDOMEN:  Soft and nontender with no rebound or guarding and good bowel  sounds.  EXTREMITY:   No cyanosis or clubbing, there is 1+ peripheral pitting  edema.   PROBLEM LIST:  1. Coronary artery disease.      a.     Status post percutaneous coronary intervention 1999 with a       stent in the right coronary artery.      b.     Multi-vessel disease, status post catheterization 2003.      c.     Stable exertional angina.      d.     Status post Cardiolite 2004 and 2007 with no ischemia and       mild left ventricular dysfunction with an ejection fraction of       45%.      e.     As for the __________ left ventricular dysfunction, ejection       fraction 45%.  2. Chronic obstructive pulmonary disease.  3. Adult onset diabetes mellitus with proteinuria.  4. Status post toe amputation  secondary to Berger's disease.  5. Obesity.  6. Poorly controlled hypertension with hypertensive heart disease.   PLAN:  1. The patient needs further control of his blood pressure.  His      volume status has improved with increasing Lasix but he appears to      have a mild degree of prerenal azotemia.  2. I feel focus should be directed towards blood pressure control and      I switched the patient to hydrochlorothiazide as a better      hypertensive medication than Lasix.  I also increased his Altace to      20 mg per day.  Followup of blood pressure can be provided by Dr.      Nadara Mustard.  3. The patient will be referred for an echocardiographic study and a      followup BNP.  4. At this point I do not see a clear indication to proceed with      cardiac catheterization, will follow the patient clinically.     Ernestine Mcmurray, MD,FACC  Electronically Signed    GED/MedQ  DD: 10/20/2006  DT: 10/20/2006  Job #: [id number]   cc:   [name]

## 2010-10-02 NOTE — Assessment & Plan Note (Signed)
[place] OFFICE NOTE   NORFLEET, HITCHINS                        MRN:          [id number]  DATE:01/08/2008                            DOB:          04-16-1951    [name] is seen for Cardiology followup.  He is followed primarily by  Dr. Dannielle Burn and he will see Dr. Dannielle Burn at the time of his next visit.  He  has coronary artery disease.  He had a stent to his right coronary in  1999.  He does have LV dysfunction.  Fortunately his echo in June 2008  showed ejection fraction in the range of 55%-60%.  His last Cardiolite  was in 2007 and it was nonischemic.   He has gained weight.  He is now up to 310 pounds.  He says that his  overall energy level is decreased.  He does not describe chest  discomfort.  As mentioned with his original MI, he does not know of any  symptoms because he was transferred from [facility] to [facility] and does not  remember if he had chest discomfort or not.   The patient also has diabetes.  The patient has chronic venous  insufficiency in his legs.  He has tried support hose, but they make his  legs feel bad during the day and so he does not use them.  He does not  have signs of congestive heart failure at this time.   ALLERGIES:  No known drug allergies.   MEDICATIONS:  Metformin, hydrochlorothiazide, Crestor, multivitamin,  aspirin, Coreg, glipizide, Altace, amlodipine, and omeprazole.   OTHER MEDICAL PROBLEMS:  See the list below.   REVIEW OF SYSTEMS:  Other than the HPI, his review of systems is  negative.   PHYSICAL EXAMINATION:  VITAL SIGNS:  Blood pressure is 135/79 with a  pulse of 70.  His weight is 310 pounds.  GENERAL:  The patient is oriented to person, time, and place.  Affect is  normal.  HEENT:  Reveals no xanthelasma.  He has normal extraocular motions.  NECK:  There are no carotid bruits.  There is no jugular venous  distention.  LUNGS:  Clear.  Respiratory effort  is not labored.  CARDIAC:  Reveals S1 with an S2.  There are no clicks or significant  murmurs.  ABDOMEN:  Obese.  EXTREMITIES:  The patient has significant peripheral edema.  This is  chronic.  It is slightly worse in the left leg than the right.  He says  that it does go down completely at night and then comes back up during  the daytime.   EKG reveals nonspecific ST-T wave changes.  P-wave amplitude is low, but  he does have sinus rhythm.   PROBLEMS:  1. History of coronary disease with a stent to the right coronary in      1999.  2. History of ejection fraction in the range of 55%-60% by echo in      June 2008.  There is distal, septal, and apical hypokinesis.  3. History of no ischemia by Cardiolite scan in 2007.  4. COPD with ongoing tobacco.  5. Tobacco use.  I had a full discussion with him about quitting      smoking.  He is not ready yet.  He says he smokes a pack a day.  6. Diabetes.  7. Hypertension.  He is on multiple meds with reasonable control at      this time.  8. History of vascular disease with a toe amputation thought secondary      to Buerger disease.  This of course represents all the more reason      why it is crucial for him to stop smoking.  9. Morbid obesity.  10.Chronic lower extremity edema as discussed above.  11.Dyslipidemia.  12.Mild renal insufficiency.   We need to be as aggressive as possible with this patient.  He has been  feeling fatigued.  I am not convinced that he is having ischemia.  He  certainly however is at risk for recurrent disease.  We will obtain a 2D  echo to reassess his LV function to see if there is any sign of  worsening LV function.  We will then see him back and decide if further  testing is appropriate or not.     Carlena Bjornstad, MD, [facility]  Electronically Signed    JDK/MedQ  DD: 01/08/2008  DT: 01/09/2008  Job #: [id number]   cc:   [name]

## 2010-10-05 ENCOUNTER — Encounter: Payer: Self-pay | Admitting: Infectious Diseases

## 2010-10-05 NOTE — H&P (Signed)
NAME:  [name], [name]                         ACCOUNT NO.:  [id number]   MEDICAL RECORD NO.:  [id number]                   PATIENT TYPE:  INP   LOCATION:  2901                                 FACILITY:  [facility]   PHYSICIAN:  Deboraha Sprang, M.D.               DATE OF BIRTH:  25-May-1950   DATE OF ADMISSION:  10/02/2002  DATE OF DISCHARGE:                                HISTORY & PHYSICAL   CHIEF COMPLAINT:  Weakness and dizziness.   HISTORY OF PRESENT ILLNESS:  A 60 year old white male with a history of  diabetes mellitus, hypertension, hyperlipidemia, and coronary artery  disease.  Complains of a two week history of dizziness and weakness,  occasionally seeing white spots or shadows especially when standing from a  sitting position.  He denies any syncopal episodes.  His only medical change  was his Teveten, which is an angiotensin receptor blocker, which was halved  approximately a week ago after symptoms had already started.  He also has  chronic chest pressure since 1999 after having a stent placed, which he  states might be a little bit worse presently, but does not bother him.  He  denies any increase in pain on exertion, and actually states his pain gets  better on exertion.  Occasionally radiating to his back.  No shortness of  breath.  Chronic nausea.  No vomiting, no diaphoresis, no PND, no orthopnea.  The patient recently feeling dizzy.  He measured his blood pressure at home.  It was in the [id number] systolic.  He went to his primary care physician who sent  him to the hospital, and then was referred to the [facility] group for further  work-up.  The patient also denies any problems urinating, any change in odor  of his urine, color of his urine, frequency of urination, or trouble  urinating in general.   PAST MEDICAL HISTORY:  1. Coronary artery disease status post stent in 1999, three vessel CAD in     05/2001, left main distal taper 20%-30%, LAD mid 50%-60%, after the D1   60%-70% mid LAD, D1 30%-40% prox ostial narrowing 60% in the lateral     division, left circumflex mid __________ 60%-70%, moderate disease RCA     mid section stent widely patent, 40%-50% stenosis prior to the stent, 30%     distal to the stent.  Distal branch had 30% stenosis.  EF approximately     65%.  No MR.  Left ventricular end diastolic pressure of 20.  2. Hypertension greater than 20 years.  3. Tobacco abuse.  Has tried several agents including nicotine patch and     oral medicines, but has been unsuccessful.  4. Diabetes mellitus times several years.  He gets his eyes checked on a     yearly basis.  Never any reported problems with his kidneys, and has     peripheral neuropathy.  5.  Hyperlipidemia on a statin.  6. Carcinoid tumor status post excision in 2000.  Blood to stomach and small     intestine.   REVIEW OF SYSTEMS:  NEUROLOGIC:  Unremarkable, except for peripheral  neuropathy.  No lumps or bumps, rashes, loss of weight, hemoptysis, problems  with blood in his urine or GI tract.  No problems with his GI or GU systems.  HEENT:  No worsening of his vision.  No problems with taste.  He states he  does have some occasional ringing in his ears.  CHEST:  Unremarkable.  CARDIOVASCULAR:  Occasional palpitations.  GI:  Unremarkable, except for  occasional constipation.  EXTREMITIES:  Occasional edema with peripheral  neuropathy.   PAST SURGICAL HISTORY:  1. Status post carcinoid excision as mentioned previously.  2. Three back surgeries in 1994, 1996, and 2001 with metal.  3. Cholecystectomy with appendectomy.   FAMILY HISTORY:  Mom 63.  History of hypertension, coronary artery disease,  diabetes mellitus, and TIA's.  Father passed from alcohol abuse.  Healthy  brothers and sisters.  Has two children; one son with Crohn's disease.   SOCIAL HISTORY:  Two packs per day times many years.  Social alcohol.  No  drugs.   ALLERGIES:  No known drug allergies.   MEDICATIONS ON  ADMISSION:  1. Nexium 40 daily.  2. Isosorbide mononitrate 60 daily.  3. Glucotrol XL 10 daily.  4. __________ 600 daily.  5. Glucophage 1000 b.i.d.  6. Altace 10 daily.  7. Lipitor 20 q.h.s.  8. Hydrochlorothiazide 25 daily.  9. Enteric-coated aspirin 81 daily.  10.      Stool softener 100 daily.  11.      Atenolol 75 daily.   PHYSICAL EXAMINATION:  GENERAL:  Obese white male, comfortable, alert and  oriented x3, in no apparent distress.  VITAL SIGNS:  Blood pressure 115/70, pulse in the 80s.  HEART:  Normal S1 and S2.  No murmurs, rubs, or gallops.  Mildly displaced  PMI.  HEENT:  No significant JVP.  No thyromegaly.  No carotid bruits.  Neck  supple.  No cervical lymphadenopathy.  LUNGS:  Clear to auscultation bilaterally.  ABDOMEN:  Soft, nontender, nondistended.  Positive bowel sounds.  He is  obese.  No hepatosplenomegaly, no hepatojugular reflex.  No renal artery  bruits.  No aortic bruits.  EXTREMITIES:  Strong radial, pedal, and femoral pulses.  No clubbing,  cyanosis, or edema.  Does have onychomycosis.  No obvious ulcers.   EKG revealed normal sinus rhythm at 76.  Slight ST elevation in the anterior  septal leads compatible with early repolarization, voltage for LVH, right R-  to-S transition of the precordial leads, nonspecific T wave changes in the  inferior bilateral leads.  No significant change from the outside hospital  done earlier tonight.   Chest x-ray with large lung volumes, flat diaphragms, consistent with COPD.  Some slight pulmonary vascular redistribution.  No significant increase in  his cardiomediastinal silhouette.   LABORATORY DATA:  White count 10.6, hematocrit 43.8, platelet count 231.  Chemistry remarkable for a sodium of 134, potassium 3.1, chloride 97, bicarb  28, BUN 27, creatinine 2.3, glucose 125.  First set of enzymes unremarkable  with a CK of 285, MB of 6.6, troponin I of 0.02.  Coags normal with QT of 12.8, INR of 132.   ASSESSMENT  AND PLAN:  This is a 60 year old with a history of diabetes,  hypertension, hyperlipidemia, and coronary artery disease, who comes in with  a history complaining of weakness and dizziness x2 weeks and chronic chest  pain.  The first issue we are addressing is presyncope.  Will perform  orthostatic blood pressure measurements, gently hydrate him with normal  saline, change atenolol to metoprolol 25 b.i.d. considering atenolol is  renally excreted in the setting of renal insufficiency.  Will discontinue  his ARB since he is hypertensive an on an ACE inhibitor already.  Hold his  hydrochlorothiazide and complete his rule out with two more sets of cardiac  enzymes.  1. Chest pain.  Continue as rule out with two sets of cardiac enzymes.  It     appears to be chronic in nature.  EKG had no significant change compared     to past.  Will check a transthoracic echo to evaluate LV systolic     function and regional wall motion abnormalities.  Continue his Protonix,     aspirin, beta blocker, ACE inhibitor, and statin.  Check a thyroid panel.  2. Renal insufficiency.  Will check a UA with micro urine lytes.  We will     hydrate him gently, check a renal ultrasound, strict ins and outs, hold     his hydrochlorothiazide, continue his ACE inhibitor.  3. Hypokalemia.  Will replace his potassium and hold his     hydrochlorothiazide.  4. Hyperlipidemia.  Check fasting lipid panel in the a.m.  Continue his     statin.  5. Diabetes mellitus.  Withhold his Glucophage, considering his renal     insufficiency.  Place him on sliding scale insulin and continuous     Glucotrol XL, and check his CBG's on a regular basis.  6. Tobacco cessation.  Discussed with him.  7. He is a full code.     Iona Beard L. Andree Elk, M.D.                     Deboraha Sprang, M.D.    GLA/MEDQ  D:  10/03/2002  T:  10/03/2002  Job:  [id number]

## 2010-10-05 NOTE — Assessment & Plan Note (Signed)
[facility] OFFICE NOTE   [name], [name]                      MRN:          [id number]  DATE:03/11/2006                            DOB:          06/22/1950    PRIMARY CARE PHYSICIAN:  Dr. Dannielle Burn   REASON FOR OFFICE VISIT:  Scheduled 1 month follow up. Please refer to Dr.  Arlina Robes office note of September 14 for full details.   Patient now returns in followup and for review of recent diagnostic testing  per Dr. Dannielle Burn.  A low level adenosine stress test was performed for  reassessment of his known coronary artery disease, and in light of complaint  of exertional chest discomfort.  However, this revealed no new evidence of  ischemia with small anteroseptal/inferior defects which were non-reversible;  calculated ejection fraction 46% (previously 43% by stress testing).   Additionally, lower extremity arterial Dopplers were ordered to exclude  significant peripheral vascular disease.  These were also negative with a  left ABI of 0.8; right of 1.1.   Since last seen, [name] has also been started on Chantix and has noted  some beneficial effect.  He has already cut back significantly on tobacco  smoking.   Patient reports no exertional angina pectoris, but seems to have some  atypical chest pain which occurs at night and with some associated  palpitations.  He otherwise does not take nitroglycerin and states that he  never has had any.   Patient notes that he has gained significant amount of weight since last  seen.  However, he also had amputation of his left second toe and says that  the swelling is now much less.  He points out that he has had chronic  bilateral lower extremity edema for some time.   CURRENT MEDICATIONS:  Unchanged since September 14.   Electrocardiogram today reveals NSR at 67 bpm with normal axis and non-  specific ST changes.   PHYSICAL EXAMINATION:  Blood pressure  150/90.  Pulse 67 regular.  Weight 290  (up 13 pounds).  NECK:  Palpable bilateral carotid pulses without bruits.  LUNGS:  Diminished breath sounds at bases but without crackles or wheezes.  HEART:  Regular rate and rhythm (S1 and S2).  No significant murmurs.  ABDOMEN:  Protuberant, nontender.  EXTREMITIES:  Bilateral 2+ non-pitting edema (left greater than right).  NEUROLOGIC:  No focal deficit.   IMPRESSION:  1. Multivessel coronary artery disease - stable.      a.     Recent non-ischemic low level adenosine stress Cardiolite;       ejection fraction 46%.      b.     Status post stent right coronary artery 1999.      c.     Non-ischemic exercise stress Cardiolite 2004.      d.     Stable multivessel coronary artery disease by cardiac       catheterization 2003; medical therapy recommended.  2. Hypertension.  3. Chronic bilateral lower extremity edema.  4. Status post recent left second toe amputation.  5.  Type 2 diabetes mellitus.  6. Hyperlipidemia.      a.     Followed by Dr. Nadara Mustard.  7. Chronic obstructive pulmonary disease/ongoing tobacco.      a.     Recently started on Chantix.  8. Obesity.   PLAN:  1. Increase Altace from 5 to 10 mg daily.  2. Check a followup complete metabolic profile in 2 weeks for assessment      of electrolytes and renal function.  3. Return clinic followup with Dr. Dannielle Burn in 6 months.  Of note, we may      want to consider reassessment of his left ventricular function with a 2-      D echocardiogram at that time, in light of his recent mild LVD by      stress testing (46%) and his bilateral lower extremity edema.      ______________________________  Mannie Stabile, PA-C    ______________________________  Ernestine Mcmurray, MD,FACC    GS/MedQ  DD:  03/11/2006  DT:  03/12/2006  Job #:  [id number]   cc:   [name]

## 2010-10-05 NOTE — Assessment & Plan Note (Signed)
[facility] OFFICE NOTE   [name], [name]                      MRN:          [id number]  DATE:01/31/2006                            DOB:          12-15-1950    HISTORY OF PRESENT ILLNESS:  The patient is a 60 year old male with multiple  cardiac risk factors and known multivessel coronary artery disease.  The  patient is status post PCI and stenting of the right coronary artery in  1999.  He had a follow-up cardiac catheterization in 2003 by Dr. Lyndel Safe,  which showed multivessel coronary artery disease.  Follow-up Cardiolite in  2004 showed no definite ischemia.  The patient had a recent toe amputation  of the left foot.  He states that he does not know if he has peripheral  vascular disease or if this was related to chronic osteomyelitis.  Unfortunately, he continues to smoke rather significantly.  He does try to  exercise some and he is trying to watch his diet.  He has lost weight.  The  patient does report occasional exertional angina, which has been in a  relatively stable pattern.  His blood pressure remains uncontrolled.   MEDICATIONS:  1. Altace 5 mg p.o. daily.  2. Glucotrol XL 10 mg p.o. daily.  3. Prilosec 40 mg daily.  4. Hydrochlorothiazide 25 mg daily.  5. Aspirin 81 mg daily.  6. Metformin, Glucophage, 500 mg two tablets p.o. b.i.d.  7. Coreg 12.5 mg t.i.d.  8. Centrum Silver.  9. Caduet 5/20 mg daily.   PHYSICAL EXAMINATION:  VITAL SIGNS:  Blood pressure 150/88, heart rate 76  beats per minute.  He weighs 277 pounds.  NECK:  Normal carotid upstroke, no carotid bruits.  LUNGS:  Bilateral wheezing.  No crackles.  CARDIAC:  Regular rate and rhythm with normal S1, S2, no murmur, rubs or  gallops.  ABDOMEN:  Soft.  EXTREMITIES:  1+ peripheral pitting edema.  Popliteal pulses are difficult  to palpate.   PROBLEM LIST:  1. Coronary artery disease.      a.     Status post  percutaneous coronary intervention in 1999 with a       stent in the right coronary artery.      b.     Moderate multivessel disease with stable multivessel coronary       artery disease, status post catheterization in 2003.      c.     Stable exertional angina.      d.     Status post Cardiolite, 2004.      e.     Mild left ventricular dysfunction, ejection fraction 43%.  2. Chronic obstructive pulmonary disease with significant wheezing on      exam.  3. Adult-onset diabetes mellitus.  4. Status post recent toe amputation.      a.     Rule out Buerger disease.      b.     Rule out peripheral vascular disease.      c.     No definite claudication.  5. Obesity.  6.  Hypertension, poorly controlled.   PLAN:  1. The patient's blood pressure is poorly controlled, and we will increase      his Altace to 10 mg p.o. daily.  2. I am concerned that the patient has significant peripheral vascular      disease, and we have ordered ABIs with arterial Dopplers.  3. The patient did not have any stress testing done in 3 years and he does      appear to have exertional angina.  He has known multivessel coronary      artery disease, and we will plan on an adenosine Cardiolite low-level      exercise in the next couple of days.  4. I have asked the patient also to discuss with his primary care      physician his tobacco use and lung disease.  The patient is      significantly wheezing on exam and only uses occasional inhalers.  He      is not on a chronic regimen.  5. The patient will also need close monitoring of his diabetes, renal      function and lipid panel.  I will ask Dr. Nadara Mustard to further monitor      this carefully.                                   Ernestine Mcmurray, MD,FACC   GED/MedQ  DD:  01/31/2006  DT:  02/01/2006  Job #:  [id number]   cc:   [name]

## 2010-10-05 NOTE — Cardiovascular Report (Signed)
[place]  Patient:    ADYN, DOWER Visit Number: [id number] MRN: [id number]          Service Type: CAT Location: [id number] Attending Physician:  Christy Sartorius Dictated by:   Christy Sartorius, M.D. [facility] Proc. Date: 06/18/01 Admit Date:  06/18/2001 Discharge Date: 06/18/2001   CC:         Terald Sleeper, M.D. [facility]  [name], M.D.   Cardiac Catheterization  PROCEDURES PERFORMED: 1. Left heart catheterization. 2. Left ventriculogram. 3. Selective coronary angiography.  DIAGNOSES: 1. Moderate three-vessel coronary artery disease. 2. Normal left ventricular systolic function.  HISTORY:  Mr. Pillado is a 60 year old white male with multiple cardiac risk factors, including continued tobacco use, who has a history of coronary artery disease.  He has undergone percutaneous intervention with stent placement in the right coronary artery in 1999.  He presents with a several months history of crescendo angina and was referred for cardiac catheterization.  TECHNIQUE:  Informed consent was obtained.  The patient was brought to the catheterization laboratory.  A 6-French sheath was placed in the right femoral artery.  Left heart catheterization, left ventriculogram, and selective coronary angiography were then performed using preformed 6-French Judkins catheters.  At the termination of the case, the catheters and sheath were removed and manual pressure applied until adequate hemostasis was achieved. The patient tolerated the procedure well and was transferred to the floor in stable condition.  FINDINGS: 1. Left main trunk:  Medium caliber vessel.  This is a long vessel with a    distal taper of 20-30%. 2. Left anterior descending artery:  This is a medium caliber vessel that    provides a large bifurcating first diagonal branch in the proximal segment    and before then extending to the apex, the LAD tapers significantly after   the diagonal branch.  There is moderate disease of 50-60% after the    diagonal branch and then a focal eccentric narrowing of 60-70% in the mid    section of the LAD.  The diagonal branch has moderate disease of 30-40% in    the proximal segment.  The lateral division is a small caliber vessel with    an ostial narrowing of 60%. 3. Left circumflex artery:  This is a small caliber vessel that provides a    trivial first marginal branch in the mid section and a medium caliber    second marginal branch distally.  The mid AV circumflex at the takeoff of    the first and second diagonal branches had moderate diffuse disease of    60-70%. 4. Right coronary artery:  Dominant.  This is a large caliber vessel that    provides a posterior descending artery and two posteroventricular branches    in the terminal segment.  The right coronary artery has evidence of an    existing stent in the mid section that is widely patent.  Prior to stent is    a focal discrete narrowing of 40-50%.  Distal to the stent is moderate    disease of 30%.  The distal branch vessels also have mild disease of 30%. 5. Left ventricle:  Normal end-systolic and end-diastolic dimensions.  Overall    left ventricular function is well preserved.  Ejection fraction is greater    than 65%.  No mitral regurgitation.  LV pressure is 180/10, aortic is    180/90.  LV EDP equals 20.  ASSESSMENT  AND PLAN:  Mr. Papworth is a 60 year old gentleman with moderate three-vessel coronary artery disease.  The patients plaque burden in the left anterior descending artery and circumflex appears to have increased somewhat compared to his previous angiogram.  Unfortunately, the mid and distal left anterior descending artery is a small caliber vessel with a long diffuse segment of disease and would be associated with a high restenosis rate. Similarly, the circumflex would suffer the same fate.  Further assessment with a stress imaging study would be  prudent to isolate the area of ischemia.  Percutaneous intervention may then be targeted if indicated.  In addition, aggressive medical therapy should be pursued as the patient currently is not on any anti-anginal therapy and has poorly controlled hypertension.  The patient will also be counseled regarding smoking cessation. ictated by:   Christy Sartorius, M.D. [place] Attending Physician:  Christy Sartorius DD:  06/18/01 TD:  06/19/01 Job: [id number]

## 2010-10-05 NOTE — Discharge Summary (Signed)
NAME:  [name], VOYTKO                         ACCOUNT NO.:  [id number]   MEDICAL RECORD NO.:  [id number]                   PATIENT TYPE:  INP   LOCATION:  3709                                 FACILITY:  [facility]   PHYSICIAN:  Deboraha Sprang, M.D.               DATE OF BIRTH:  1950/07/07   DATE OF ADMISSION:  10/02/2002  DATE OF DISCHARGE:  10/04/2002                           DISCHARGE SUMMARY - REFERRING   DISCHARGING PHYSICIAN:  Jacqulyn Ducking, M.D.   HISTORY OF PRESENT ILLNESS:  The patient is a 60 year old white male who  presented with a two-week history of dizziness and weakness.  Occasionally  he would see white spots and shadows, especially when standing.  He denies  loss of consciousness.  His Teveten was decreased to half a pill after his  symptoms started.  It is not improved.  He also relates a history of chronic  chest discomfort since 1999 and is unassociated with exertion and  occasionally radiates to his back.  He denies any shortness of breath.  He  has chronic nausea but no vomiting.   PAST MEDICAL HISTORY:  1. Diabetes.  2. Obesity.  3. Hypertension.  4. Hyperlipidemia.  5. Known  coronary artery disease with early family history and continued     tobacco use.   LABORATORY DATA:  At [facility], sodium was 134, potassium 3.1, BUN  27, creatinine 2.3.  Total CK was 285 and MB 6.6 and relative index 2.2.  Troponin was 0.02.  PT 12.8, PTT 32.4.  H&H 14.9 and 43.8, normal indices,  platelets 231, wbc 10.6.  At [facility], urinalysis was  unremarkable.  TSH was 3.344.  Urine creatinine was 194.2, urine sodium was  25.  Fasting cholesterol was 128, triglycerides 204, HDL 33, LDL 54.  Subsequent CK and troponins were negative for myocardial infarction.   EKG showed sinus bradycardia, normal sinus rhythm, normal axis, nonspecific  STT waves.   HOSPITAL COURSE:  The patient was admitted to 2900 to further evaluate his  symptoms.  It was  also noted that he was hypokalemic and had renal  insufficiency.  Orthostatic blood pressures were obtained on Oct 03, 2002.  This showed a lying blood pressure 120/55 with pulse of 67, sitting 114/67  with pulse of 79, standing 102/49 with pulse of 127.  His Altace and Imdur  were decreased.  His hydrochlorothiazide and Teveten were held.  He was  started on fluids.  Over the next 24 to 48 hours, his potassium was also  repleted and by Oct 04, 2002, recheck of his orthostatic blood pressures had  improved.  His recheck of BUN and creatinine on Oct 04, 2002, was 18 and  1.2, and his potassium was 4.6.  Cardiogram had been performed.  Preliminary  report shows normal LV function.  Hemoglobin A1C was also checked and  reported to be 6.3.  Prior to  discharge, he received smoking cessation  consultation and a renal ultrasound.  It is noted that the renal ultrasound  is pending at the time of this discharge.   DISCHARGE DIAGNOSES:  1. Near syncope, probably secondary to orthostatic     hypotension/medications/dehydration.  2. Acute renal insufficiency probably secondary to dehydration.  3. Hypokalemia resolved.  4. Hyperlipidemia.  5. Chronic chest discomfort.  6. Tobacco use, probable chronic obstructive pulmonary disease.  7. Obesity.  8. Hyperglycemia; however, hemoglobin A1C was within normal limits.  9. History as previously.   DISPOSITION:  He is discharged home.   DISCHARGE MEDICATIONS:  1. Decrease Imdur to 30 mg daily.  2. Altace 5 mg daily.  3. He is instructed not to take the hydrochlorothiazide or the Teveten.  4. He is given permission to continue Nexium 40 mg daily.  5. Glucotrol XL 10 mg daily.  6. Glucophage 1000 mg daily.  7. Lipitor 20 mg q.h.s.  8. Enteric coated aspirin 81 mg daily.  9. Atenolol 75 mg daily.  10.      Nitroglycerin 0.4 as needed.  11.      Stool softener as previously.   DIET:  He is asked to maintain low salt, fat, cholesterol, ADA diet.    DISCHARGE INSTRUCTIONS:  He is asked to discontinue smoking or any tobacco  products.  He will undergo outpatient diabetes education at [facility]:  He will see Roque Cash, Gus Height., on Tuesday, October 19, 2002, at 1:30  p.m. and he will arrange a follow-up appointment with Dr. Nadara Mustard.   The patient was also asked to consider smoking support program, weight loss  programs.  Once he follows up in the office, orthostatic blood pressures  will be rechecked as well as a BNP to also look for improvement in his BUN  and creatinine.  It was noted that in January of 2003, his BUN and  creatinine was 7 and 0.9.     Sharyl Nimrod, P.A. [facility]                    Deboraha Sprang, M.D.    EW/MEDQ  D:  10/04/2002  T:  10/04/2002  Job:  [id number]   cc:   [name]  [place], [place] [address]  Fax: [phone]   Satira Sark, M.D. [facility]  [address]  [address]  [place], [place] [address]

## 2010-10-05 NOTE — Assessment & Plan Note (Signed)
[facility] OFFICE NOTE   [name], [name]                      MRN:          [id number]  DATE:09/03/2006                            DOB:          03-21-1951    HISTORY OF PRESENT ILLNESS:  The patient is a 60 year old male with  multiple cardiac risk factors, who unfortunately continues to smoke. The  patient has known multi-vessel coronary artery disease with his last  catheterization being done in 2003.  The patient also had a Cardiolite  stress study done in 2007 which demonstrated the small anteroseptal  inferior defect.  There was however, no ischemia and the ejection  fraction was estimated at 45%.  The patient overall has been doing well.  He was evaluated for peripheral vascular disease and ABIs done several  months ago.  There was no evidence of significant flow limiting  peripheral vascular disease.  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   The patient today presents quite hypertensive in the office.  He denies  any shortness of breath, orthopnea, PND.  He does report occasional  chest pains, which are rather sharp and happen all of the sudden when  sitting down.  They do not appear to be exertional in nature.  They are  also not prolonged and only last in the order of seconds.   MEDICATIONS:  1. Altace 5 mg p.o. daily.  2. Glucotrol XL 10 mg per day.  3. Aspirin 81 mg daily.  4. Centrum Silver.  5. Caduet 5/20 mg p.o. daily.  6. Metformin 5 mg p.o. daily.  7. Prilosec 20 mg p.o. daily.  8. Lasix 40 mg p.o. daily.  9. Coreg 12.5 mg p.o. b.i.d.   PHYSICAL EXAMINATION:  VITAL SIGNS:  Blood pressure is 187/94, heart  rate is 66 beats per minute, weight is 296 pounds.  GENERAL:  Overweight white male but in no apparent distress.  HEENT:  Normal.  NECK:  Normal carotid upstroke, no carotid bruits.  JVD is approximately  7 cm.  LUNGS:  Are  clear bilaterally.  HEART:  Regular rate and rhythm.  Normal S1, S2.  No murmur, rubs or  gallops.  ABDOMEN:  Is soft.  EXTREMITIES:  Exam reveals 3+ peripheral pitting edema.   PROBLEM:  1. Coronary artery disease.      a.     Status post percutaneous coronary intervention 1999 with       stents in right coronary artery.      b.     Multivessel disease with stable disease status post       catheterization 2003.      c.     Stable exertional angina.      d.     Status post Cardiolite 2004, 2007 with no ischemia.      e.     Mild left ventricular dysfunction. Ejection fraction 45%.  2. Chronic obstructive pulmonary disease (no wheezing on exam      currently).  3. Adult onset diabetes mellitus.  4. 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  5. Obesity.  6. Hypertension which is extremely poorly controlled.  7. Hypertensive heart disease with marked lower extremity edema.   PLAN:  1. The patient has significant volume overload, he has marked lower      extremity edema, of which I suspect is secondary to hypertensive      heart disease.  His blood pressure is quite uncontrolled.  2. Made adjustments in the patient's medical regimen.  I have      increased his Lasix to 40 twice a day and increased his Altace to      10 mg p.o. daily.  3. The patient will need close followup in the near future to further      manage his blood pressure and his heart failure symptoms.  His      heart failure appears to be secondary to diastolic dysfunction.  4. A repeat echocardiogram will be obtained in the next couple of      months and if LV function has worsened, we may consider a repeat      cardiac catheterization.  5. The patient also at age 57 given his multiple risk factors will      need to be screened for abdominal aortic aneurysm and I suggest      strongly an abdominal ultrasound in 2 years.     Ernestine Mcmurray, MD,FACC  Electronically Signed    GED/MedQ   DD: 09/03/2006  DT: 09/03/2006  Job #: [id number]   cc:   Dr. Nadara Mustard

## 2010-10-05 NOTE — Op Note (Signed)
[place]. [facility]  Patient:    [name], [name]                         MRN: [id number] Proc. Date: 06/10/00 Attending:  Elizabeth Sauer, M.D.                           Operative Report  PREOPERATIVE DIAGNOSIS:  Recurrent herniated disk at L4-5 on the left with significant spondylitic disease at L5-S1.  POSTOPERATIVE DIAGNOSIS:  Recurrent herniated disk at L4-5 on the left with significant spondylitic disease at L5-S1.  OPERATION PERFORMED:  L4-5, L5-S1 laminectomy, diskectomy and posterior lumbar interbody fusion with Ray threaded fusion cages.  SURGEON:  Elizabeth Sauer, M.D.  ANESTHESIA:  General endotracheal.  PREP:  Sterile Betadine prep and scrub with alcohol wipe.  DESCRIPTION OF PROCEDURE:  The patient is a 60 year old gentleman who has had a 4-5 and 5-1 disk done in the past.  He has a recurrence of 4-5 and extensive spondylitic disease at L5-S1.  The patient was taken to the operating room, smoothly anesthetized and intubated, and placed prone on the operating table. Following shave, prep and drape in the usual sterile fashion, the skin was infiltrated with 1% lidocaine with 1:400,000 epinephrine.  The skin incision was made from the bottom of L3 to the bottom of S1.  The lamina of L4, L5 and S1 were exposed bilaterally in the subperiosteal plane out of the 4-5 and 5-1 facet joints.  Intraoperative x-ray was taken to confirm correctness of level. The remaining lamina of 4 and 5 as well as the pars interarticularis and inferior facets of each level were removed.  The superior facet of L5 and S1 were removed and the bone set aside for grafting.  The ligamentum flavum was removed and the nerve roots explored.  At 4-5 on the left side, there was recurrent disk with compression of L5 root as it traversed that area.  This was removed without difficulty.  At 5-1 there was extensive spondylitic disease and a small amount of disk.  The right side of the  anatomy was fairly normal.  Following completion of diskectomy at both levels, 14 x 26 mm Ray threaded fusion cages were placed.  Intraoperative x-ray showed good placement of the cages.  They were packed with bone graft harvested from the facet joints and capped.  The wound was irrigated and hemostasis assured.  The fascia was reapproximated with 0 Vicryl in interrupted fashion and the subcutaneous tissues were reapproximated with 0 Vicryl in interrupted fashion. The subcuticular tissues were reapproximated with 3-0 Vicryl in interrupted fashion.  Skin was closed with 3-0 nylon in running locked fashion.  Betadine Telfa dressing was then applied and made occlusive with Op-Site. The patient was then returned to recovery room in good condition. DD:  06/10/00 TD:  06/10/00 Job: [id number]

## 2010-10-05 NOTE — Discharge Summary (Signed)
[place]. [facility]  Patient:    [name], [name]                      MRN: [id number] Adm. Date:  [id number] Disc. 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  PROCEDURES:  L4-5, L5-S1 laminectomy, diskectomy, posterior lumbar interbody fusion with ______ cage.  COMPLICATIONS:  None.  DISCHARGE STATUS:  Alive and well.  HISTORY OF PRESENT ILLNESS:  [age]-year-old right-handed white gentleman whose history and physical is recounted in the chart.  He has had disk in 1994 and 1997.  He did well and had recurrent pain in his left leg and MRA showed recurrent disk at L4-5 and spondylosis at L5-S1.  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  MEDICATIONS:  Prevacid, alprazolam, Viagra, hydrochlorothiazide, Colace, and Tylox.  PHYSICAL EXAMINATION:  GENERAL:  Unremarkable.  NEUROLOGIC:  He had dorsiflexion weakness on the left side.  He had positive straight leg raise.  HOSPITAL COURSE:  He was admitted after ascertaining normal laboratory values and underwent a L4-5, L5-S1 fusion.  Postop, his leg pain is completely gone. Postoperatively, his strength is full, his incision is dry and well healing. He is up eating and voiding normally.  He has participated in physical therapy and is walking without a walker.  He still needs help getting in and out bed and for that reason he is going to go home with a hospital bed.  He is also being given Percocet for pain as well as a series of nicotine patches to help with smoking cessation.  FOLLOWUP:  His followup will be in the [place] office in one week for suture removal. DD:  06/13/00 TD:  06/13/00 Job: [id number]

## 2010-10-05 NOTE — H&P (Signed)
[place]. [facility]  Patient:    [name], [name]                         MRN: [id number] Adm. Date:  06/10/00 Attending:  Elizabeth Sauer, M.D.                         History and Physical  ADMISSION DIAGNOSIS:  Spondylosis L4-5 and L5-S1.  HISTORY OF PRESENT ILLNESS:  This is a 60 year old right-handed white male who in 1994 had a 4-5 diskectomy done and in 1997 had a 5-1 diskectomy done, and then had a postoperative wound infection that was taken care of and did not give him any difficulty.  We have visited with him off and on over the past few years and had episodic back pain, but doing reasonably well and came into my office about two weeks ago with a marked increase in left leg pain and new disk at L5-S1 eccentric to the left side, severe spondylitic change at 4-5. He is now admitted for a fusion.  He had in the past been giving consideration for a fusion, but developed some GI difficulty which caused this to be forestalled.  He is now admitted for a fusion.  PAST MEDICAL HISTORY:  Remarkable for a history of carcinoid which was operated on a couple of years ago and has no evidence of recurrence.  He has also had the lumbar surgeries as noted above.  MEDICATIONS: 1. Prevacid 30 mg as needed. 2. Alprasolam 0.5 mg as needed. 3. Viagra p.r.n. basis. 4. Hydrochlorothiazide 25 mg a day. 5. Colace p.r.n. 6. Tylox p.r.n.  SOCIAL HISTORY:  He drinks alcohol socially and continues to smoke.  He is currently not employed.  He is retired from the [employer]  FAMILY HISTORY:  Noncontributory.  REVIEW OF SYSTEMS:  Positive for back and leg pain.  PHYSICAL EXAMINATION:  HEENT:  Within normal limits.  He has reasonable range of motion in his neck.  CHEST:  Clear.  HEART:  Regular rate and rhythm.  ABDOMEN:  Nontender with no hepatosplenomegaly.  EXTREMITIES:  No clubbing, cyanosis, or edema.  GENITOURINARY:  Deferred.  NEUROLOGICAL:   Peripheral pulses are good.  He is awake, alert, and oriented. Cranial nerves II-XII intact.  Motor examination shows 5/5 strength throughout the upper and lower extremities.  He has positive straight leg raising on the left, numbness in the L5 and S1 distribution on the left, and absent ankle jerk on the left.  Knee jerks are intact.  His MRI results have been reviewed above.  IMPRESSION:  Progressive and incapacitating L4-5 and L5-S1 spondylosis.  PLAN:  Lumbar laminectomy, diskectomy, and posterior lumbar interbody fusion with Ray therapeutic cages.  The risks and benefits of this approach have been discussed with him and he wishes to proceed. DD:  06/10/00 TD:  06/10/00 Job: [id number]

## 2010-10-12 ENCOUNTER — Other Ambulatory Visit: Payer: Self-pay | Admitting: Cardiology

## 2010-11-07 ENCOUNTER — Ambulatory Visit (INDEPENDENT_AMBULATORY_CARE_PROVIDER_SITE_OTHER): Payer: Medicare Other | Admitting: Infectious Diseases

## 2010-11-07 ENCOUNTER — Encounter: Payer: Self-pay | Admitting: Infectious Diseases

## 2010-11-07 VITALS — BP 136/71 | HR 64 | Temp 97.7°F | Ht 75.5 in | Wt 287.0 lb

## 2010-11-07 DIAGNOSIS — M86179 Other acute osteomyelitis, unspecified ankle and foot: Secondary | ICD-10-CM

## 2010-11-07 LAB — CBC WITH DIFFERENTIAL/PLATELET
Basophils Absolute: 0.1 10*3/uL (ref 0.0–0.1)
Basophils Relative: 1 % (ref 0–1)
Eosinophils Absolute: 0.1 10*3/uL (ref 0.0–0.7)
Eosinophils Relative: 2 % (ref 0–5)
HCT: 42.8 % (ref 39.0–52.0)
Hemoglobin: 13.8 g/dL (ref 13.0–17.0)
Lymphocytes Relative: 29 % (ref 12–46)
Lymphs Abs: 2.3 10*3/uL (ref 0.7–4.0)
MCH: 26.7 pg (ref 26.0–34.0)
MCHC: 32.2 g/dL (ref 30.0–36.0)
MCV: 82.8 fL (ref 78.0–100.0)
Monocytes Absolute: 0.8 10*3/uL (ref 0.1–1.0)
Monocytes Relative: 9 % (ref 3–12)
Neutro Abs: 4.8 10*3/uL (ref 1.7–7.7)
Neutrophils Relative %: 60 % (ref 43–77)
Platelets: 246 10*3/uL (ref 150–400)
RBC: 5.17 MIL/uL (ref 4.22–5.81)
RDW: 20.9 % — ABNORMAL HIGH (ref 11.5–15.5)
WBC: 8.1 10*3/uL (ref 4.0–10.5)

## 2010-11-07 LAB — SEDIMENTATION RATE: Sed Rate: 14 mm/hr (ref 0–16)

## 2010-11-07 NOTE — Progress Notes (Signed)
  Subjective:    Patient ID: [name], male    DOB: 05-11-1951, 60 y.o.   MRN: [id number]  HPI  60 yo M with hx of HTN, MI with stent (99), DM2 (~7 years, on insulin for 2 years). March 2011, he noted blisters on his R foot. Had blisters lanced by his MD and eventually partial amputation of R second toe, fusion (with a screw) of the interphalangeal joint of L hallux on December 21, 2009. 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 His L 5th metatarsal was resected March 07, 2010(Cx alpha strep) and he then developed a non-healing wound. He had a wound vac placed 03-29-10. He was then started on IV vancomycin 04-03-10. This was discontinued on 05-11-10 when he developed a RUE DVT that extended to his neck. His PIC was removed. He is still on coumadin at this point for a planned months. He has since undergone tagged WBC study showing osteomyelitis in his residual 5th metatarsal bone. He underwent L 5th partial ray amputation on 07-06-10. The margin of this showed no osteo. His Cx showed MRSA (sens-vanco, gent, clinda, rif, tigecycline, bactrim) and was placed on Bactrim. He was seen in ID clinic 08-14-10 and continued concerns about osteo in his foot. Was given option of starting on IV anbx to try and treat this. He was treated with vanco and then transitioned to Bactrim.  Feels like his foot is 95% healed. Has been on bactrim an dhas had no problems with taking it. FSG have been "great". No fevers or chills.   Review of Systems No feeling in his foot except for heavy touch.     Objective:   Physical Exam  Constitutional: He appears well-developed and well-nourished.  Musculoskeletal:       Feet:          Assessment & Plan:

## 2010-11-07 NOTE — Assessment & Plan Note (Signed)
He appears to be doing well. Will check an ESR, CRP and CBC on him today. We discussed at length f/u options. Will plan to see him back in 3 months and if his wound is healed then will get a MRI.

## 2010-11-08 LAB — C-REACTIVE PROTEIN: CRP: 0.3 mg/dL (ref <0.6)

## 2010-11-14 ENCOUNTER — Encounter: Payer: Self-pay | Admitting: Cardiology

## 2011-01-02 ENCOUNTER — Other Ambulatory Visit: Payer: Self-pay | Admitting: *Deleted

## 2011-01-02 DIAGNOSIS — M86179 Other acute osteomyelitis, unspecified ankle and foot: Secondary | ICD-10-CM

## 2011-01-02 MED ORDER — SULFAMETHOXAZOLE-TMP DS 800-160 MG PO TABS: 1.0000 | ORAL_TABLET | Freq: Two times a day (BID) | ORAL | Status: DC

## 2011-01-02 NOTE — Telephone Encounter (Signed)
Phone call to pt to make f/u appt w/ Dr. Johnnye Sima.  Made for 02/13/11 @ 1000.  Lorne Skeens, RN

## 2011-02-13 ENCOUNTER — Ambulatory Visit (INDEPENDENT_AMBULATORY_CARE_PROVIDER_SITE_OTHER): Payer: Medicare Other | Admitting: Infectious Diseases

## 2011-02-13 ENCOUNTER — Other Ambulatory Visit: Payer: Self-pay | Admitting: Infectious Diseases

## 2011-02-13 ENCOUNTER — Other Ambulatory Visit: Payer: Self-pay | Admitting: Cardiology

## 2011-02-13 ENCOUNTER — Telehealth: Payer: Self-pay | Admitting: *Deleted

## 2011-02-13 ENCOUNTER — Encounter: Payer: Self-pay | Admitting: Infectious Diseases

## 2011-02-13 ENCOUNTER — Other Ambulatory Visit: Payer: Self-pay | Admitting: Infectious Disease

## 2011-02-13 VITALS — BP 171/76 | HR 86 | Temp 98.0°F | Ht 75.0 in | Wt 283.0 lb

## 2011-02-13 DIAGNOSIS — M86172 Other acute osteomyelitis, left ankle and foot: Secondary | ICD-10-CM

## 2011-02-13 DIAGNOSIS — M86179 Other acute osteomyelitis, unspecified ankle and foot: Secondary | ICD-10-CM

## 2011-02-13 NOTE — Telephone Encounter (Signed)
Called patient to notify him of appt for Nuclear Medicine Bone Scan scheduled for 02/21/11 at [facility] at 12:00 pm. Myrtis Hopping CMA

## 2011-02-13 NOTE — Progress Notes (Signed)
  Subjective:    Patient ID: [name], male    DOB: August 07, 1950, 60 y.o.   MRN: [id number]  HPI 60 yo M with hx of HTN, MI with stent (99), DM2 (~7 years, on insulin for 2 years). March 2011, he noted blisters on his R foot. Had blisters lanced by his MD and eventually had partial amputation of R second toe, fusion (with a screw) of the interphalangeal joint of L hallux on December 21, 2009. 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 He then developed a non-healing wound. He had a wound vac placed 03-29-10 and was then started on IV vancomycin 04-03-10. This was discontinued on 05-11-10 when he developed a RUE DVT that extended to his neck. His PIC was removed. He underwent tagged WBC study showing osteomyelitis in his residual 5th metatarsal bone. He underwent L 5th partial ray amputation on 07-06-10. The margin of this showed no osteo. His Cx showed MRSA (sens-vanco, gent, clinda, rif, tigecycline, bactrim) and was placed on Bactrim. He was seen in ID clinic 08-14-10 and continued concerns about osteo in his foot. Was given option of starting on IV anbx to try and treat this. He was treated with vanco and then transitioned to Bactrim. ESR normal June 2012.  Today states that his foot is pretty much "healed". Has had tendon releases due to downward going toes but otherwise his wounds are well healed.   Review of Systems  Constitutional: Negative for fever and chills.       Objective:   Physical Exam  Neurological:       Decreased light touch grossly in LLE  Skin:       He has venous stasis like changes of his LLE. His wounds are healed, his foot is non-tender. There is no increase in heat. No fluctuance.           Assessment & Plan:

## 2011-02-13 NOTE — Assessment & Plan Note (Signed)
He appears to be doing very well. He would like imaging done to confirm that he no longer has this infection in his body. Will repeat his tagged WBC study, spoke with him about how non-specific this study is and that a positive does not necessarily indicate continued infection. He will continue on his bactrim til after his study.

## 2011-02-21 ENCOUNTER — Encounter (HOSPITAL_COMMUNITY)
Admission: RE | Admit: 2011-02-21 | Discharge: 2011-02-21 | Disposition: A | Discharge status: Home / Self Care | Payer: Medicare Other | Source: Ambulatory Visit | Attending: Infectious Diseases | Admitting: Infectious Diseases

## 2011-02-21 ENCOUNTER — Ambulatory Visit (HOSPITAL_COMMUNITY): Payer: Medicare Other

## 2011-02-21 DIAGNOSIS — M869 Osteomyelitis, unspecified: Secondary | ICD-10-CM | POA: Insufficient documentation

## 2011-02-21 MED ORDER — TECHNETIUM TC 99M MEDRONATE IV KIT
25.0000 | PACK | Freq: Once | INTRAVENOUS | Status: AC | PRN
  Administered 2011-02-21: 25 via INTRAVENOUS

## 2011-02-25 ENCOUNTER — Other Ambulatory Visit: Payer: Self-pay | Admitting: Cardiology

## 2011-02-25 NOTE — Telephone Encounter (Signed)
[name] has appointment with Dr. Dannielle Burn on 10-22 for one year follow up. He will run out of his Lasix on Friday. Wants to know if we can refill until he sees Dr.DeGent.

## 2011-02-26 MED ORDER — FUROSEMIDE 40 MG PO TABS: 40.0000 mg | ORAL_TABLET | Freq: Every day | ORAL | Status: DC

## 2011-02-26 NOTE — Telephone Encounter (Signed)
Patient notified via answering machine.

## 2011-03-04 ENCOUNTER — Telehealth: Payer: Self-pay | Admitting: *Deleted

## 2011-03-04 NOTE — Telephone Encounter (Signed)
Patient called regarding bone scan results.  Called him back and left message, will have Dr. Johnnye Sima to review 03/06/11 and will call him back with results. Myrtis Hopping CMA

## 2011-03-06 NOTE — Telephone Encounter (Signed)
Patient given bone scan results and per Dr. Johnnye Sima he can stop the antibiotic. Myrtis Hopping CMA

## 2011-03-11 ENCOUNTER — Ambulatory Visit (INDEPENDENT_AMBULATORY_CARE_PROVIDER_SITE_OTHER): Payer: Medicare Other | Admitting: Cardiology

## 2011-03-11 ENCOUNTER — Encounter: Payer: Self-pay | Admitting: Cardiology

## 2011-03-11 VITALS — BP 154/84 | HR 77 | Ht 75.0 in | Wt 278.0 lb

## 2011-03-11 DIAGNOSIS — I2510 Atherosclerotic heart disease of native coronary artery without angina pectoris: Secondary | ICD-10-CM

## 2011-03-11 DIAGNOSIS — F17200 Nicotine dependence, unspecified, uncomplicated: Secondary | ICD-10-CM

## 2011-03-11 DIAGNOSIS — E785 Hyperlipidemia, unspecified: Secondary | ICD-10-CM

## 2011-03-11 DIAGNOSIS — I731 Thromboangiitis obliterans [Buerger's disease]: Secondary | ICD-10-CM

## 2011-03-11 DIAGNOSIS — I10 Essential (primary) hypertension: Secondary | ICD-10-CM

## 2011-03-11 NOTE — Assessment & Plan Note (Signed)
Cardiolite 2009 no ischemia. Continue medical therapy.

## 2011-03-11 NOTE — Assessment & Plan Note (Signed)
Blood pressure running high today but patient states that normally his blood pressure is normal and he forgot to take his medications at the usual time this morning

## 2011-03-11 NOTE — Assessment & Plan Note (Signed)
Patient again was counseled regarding tobacco use

## 2011-03-11 NOTE — Progress Notes (Signed)
History of present illness:  The patient is a 60 year old male with history of coronary artery disease, stent placement to the mid RCA in 1999 and a normal Cardiolite study in 2009. The patient has normal ABIs, or diabetes, venous insufficiency and lymphedema all constricting to prior nonhealing ulcers of the lower extremities as well as toe amputation. There has been some question of osteomyelitis in the past and the patient is being followed by Dr. Johnnye Sima of the infectious disease service in [place]. He states however that his foot is doing quite well and has no active or draining ulcers. From a cardiac perspective he is stable. He denies any chest pain shortness of breath orthopnea PND. He has no palpitations or syncope. Blood pressures running high today but he states that normally at home his blood pressures within normal limits.   Allergies, family history and social history: As documented in chart and reviewed  Medications: Documented and reviewed in chart.  Past medical history: Reviewed and see problem list below   Review of systems: No nausea or vomiting. No fever or chills. No melena hematochezia. No dysuria or frequency. No palpitations or syncope.   Physical examination : Vital signs documented below General: Overweight white male in no apparent distress HEENT: EOMI, PERRLA, normal carotids okay no carotid bruits. No thyromegaly nonnodular thyroid Lungs: Clear breath sounds bilaterally. No wheezing Heart: Regular rate and rhythm with normal S1-S2 no murmur rubs or gallops Abdomen: Soft nontender no rebound or guarding good bowel sounds Extremity exam: No cyanosis clubbing or edema patient is wearing a compression stocking on the left leg Neurologic: Alert and oriented and grossly nonfocal Psychiatric: Normal affect Vascular exam: Normal peripheral pulses bilaterally status post toe amputation bilaterally [third toe bilaterally] healed foot ulcer

## 2011-03-11 NOTE — Assessment & Plan Note (Signed)
Followed by the patient's primary care physician 

## 2011-03-11 NOTE — Patient Instructions (Signed)
Continue all current medications. Your physician wants you to follow up in:  1 year.  You will receive a reminder letter in the mail one-two months in advance.  If you don't receive a letter, please call our office to schedule the follow up appointment   

## 2011-03-11 NOTE — Assessment & Plan Note (Signed)
Status post toe amputation. Well-healed ulcers followed closely by the wound Center.

## 2011-03-19 ENCOUNTER — Telehealth: Payer: Self-pay | Admitting: *Deleted

## 2011-03-19 NOTE — Telephone Encounter (Signed)
Patient called requesting the last office note and bone scan results faxed to his podiatrist, Dr. Marcheta Grammes.  Note and results faxed. Myrtis Hopping CMA

## 2011-06-06 DIAGNOSIS — E119 Type 2 diabetes mellitus without complications: Secondary | ICD-10-CM | POA: Diagnosis not present

## 2011-08-29 DIAGNOSIS — E669 Obesity, unspecified: Secondary | ICD-10-CM | POA: Diagnosis not present

## 2011-08-29 DIAGNOSIS — F17200 Nicotine dependence, unspecified, uncomplicated: Secondary | ICD-10-CM | POA: Diagnosis not present

## 2011-08-29 DIAGNOSIS — E1142 Type 2 diabetes mellitus with diabetic polyneuropathy: Secondary | ICD-10-CM | POA: Diagnosis not present

## 2011-08-29 DIAGNOSIS — E1149 Type 2 diabetes mellitus with other diabetic neurological complication: Secondary | ICD-10-CM | POA: Diagnosis not present

## 2011-09-19 DIAGNOSIS — K5900 Constipation, unspecified: Secondary | ICD-10-CM | POA: Diagnosis not present

## 2011-09-19 DIAGNOSIS — Z87898 Personal history of other specified conditions: Secondary | ICD-10-CM | POA: Diagnosis not present

## 2011-09-19 DIAGNOSIS — Z86010 Personal history of colonic polyps: Secondary | ICD-10-CM | POA: Diagnosis not present

## 2011-10-08 DIAGNOSIS — Z09 Encounter for follow-up examination after completed treatment for conditions other than malignant neoplasm: Secondary | ICD-10-CM | POA: Diagnosis not present

## 2011-10-08 DIAGNOSIS — D126 Benign neoplasm of colon, unspecified: Secondary | ICD-10-CM | POA: Diagnosis not present

## 2011-10-08 DIAGNOSIS — D175 Benign lipomatous neoplasm of intra-abdominal organs: Secondary | ICD-10-CM | POA: Diagnosis not present

## 2011-10-08 DIAGNOSIS — Z86010 Personal history of colonic polyps: Secondary | ICD-10-CM | POA: Diagnosis not present

## 2011-10-24 ENCOUNTER — Other Ambulatory Visit: Payer: Self-pay | Admitting: Gastroenterology

## 2011-10-24 DIAGNOSIS — R1084 Generalized abdominal pain: Secondary | ICD-10-CM | POA: Diagnosis not present

## 2011-10-24 DIAGNOSIS — R198 Other specified symptoms and signs involving the digestive system and abdomen: Secondary | ICD-10-CM | POA: Diagnosis not present

## 2011-10-24 DIAGNOSIS — Z86010 Personal history of colonic polyps: Secondary | ICD-10-CM | POA: Diagnosis not present

## 2011-10-24 DIAGNOSIS — Z87898 Personal history of other specified conditions: Secondary | ICD-10-CM | POA: Diagnosis not present

## 2011-10-24 DIAGNOSIS — Z86012 Personal history of benign carcinoid tumor: Secondary | ICD-10-CM

## 2011-10-28 ENCOUNTER — Ambulatory Visit
Admission: RE | Admit: 2011-10-28 | Discharge: 2011-10-28 | Disposition: A | Discharge status: Home / Self Care | Payer: Medicare Other | Source: Ambulatory Visit | Attending: Gastroenterology | Admitting: Gastroenterology

## 2011-10-28 DIAGNOSIS — K5900 Constipation, unspecified: Secondary | ICD-10-CM | POA: Diagnosis not present

## 2011-10-28 DIAGNOSIS — R634 Abnormal weight loss: Secondary | ICD-10-CM | POA: Diagnosis not present

## 2011-10-28 DIAGNOSIS — Z86012 Personal history of benign carcinoid tumor: Secondary | ICD-10-CM

## 2011-10-28 DIAGNOSIS — R1012 Left upper quadrant pain: Secondary | ICD-10-CM | POA: Diagnosis not present

## 2011-10-28 MED ORDER — IOHEXOL 300 MG/ML  SOLN
100.0000 mL | Freq: Once | INTRAMUSCULAR | Status: AC | PRN
  Administered 2011-10-28: 100 mL via INTRAVENOUS

## 2011-10-31 DIAGNOSIS — IMO0001 Reserved for inherently not codable concepts without codable children: Secondary | ICD-10-CM | POA: Diagnosis not present

## 2011-10-31 DIAGNOSIS — N400 Enlarged prostate without lower urinary tract symptoms: Secondary | ICD-10-CM | POA: Diagnosis not present

## 2011-10-31 DIAGNOSIS — I10 Essential (primary) hypertension: Secondary | ICD-10-CM | POA: Diagnosis not present

## 2011-10-31 DIAGNOSIS — K219 Gastro-esophageal reflux disease without esophagitis: Secondary | ICD-10-CM | POA: Diagnosis not present

## 2011-11-05 ENCOUNTER — Other Ambulatory Visit: Payer: Self-pay | Admitting: Cardiology

## 2011-11-07 DIAGNOSIS — I209 Angina pectoris, unspecified: Secondary | ICD-10-CM | POA: Diagnosis not present

## 2011-11-07 DIAGNOSIS — I10 Essential (primary) hypertension: Secondary | ICD-10-CM | POA: Diagnosis not present

## 2011-11-07 DIAGNOSIS — G4700 Insomnia, unspecified: Secondary | ICD-10-CM | POA: Diagnosis not present

## 2011-11-07 DIAGNOSIS — K219 Gastro-esophageal reflux disease without esophagitis: Secondary | ICD-10-CM | POA: Diagnosis not present

## 2011-11-07 DIAGNOSIS — F411 Generalized anxiety disorder: Secondary | ICD-10-CM | POA: Diagnosis not present

## 2011-11-07 DIAGNOSIS — J45901 Unspecified asthma with (acute) exacerbation: Secondary | ICD-10-CM | POA: Diagnosis not present

## 2011-11-07 DIAGNOSIS — IMO0001 Reserved for inherently not codable concepts without codable children: Secondary | ICD-10-CM | POA: Diagnosis not present

## 2011-11-07 DIAGNOSIS — E7800 Pure hypercholesterolemia, unspecified: Secondary | ICD-10-CM | POA: Diagnosis not present

## 2011-12-03 DIAGNOSIS — F17200 Nicotine dependence, unspecified, uncomplicated: Secondary | ICD-10-CM | POA: Diagnosis not present

## 2011-12-03 DIAGNOSIS — E1142 Type 2 diabetes mellitus with diabetic polyneuropathy: Secondary | ICD-10-CM | POA: Diagnosis not present

## 2011-12-03 DIAGNOSIS — E1149 Type 2 diabetes mellitus with other diabetic neurological complication: Secondary | ICD-10-CM | POA: Diagnosis not present

## 2011-12-03 DIAGNOSIS — E669 Obesity, unspecified: Secondary | ICD-10-CM | POA: Diagnosis not present

## 2011-12-17 DIAGNOSIS — L97509 Non-pressure chronic ulcer of other part of unspecified foot with unspecified severity: Secondary | ICD-10-CM | POA: Diagnosis not present

## 2011-12-24 DIAGNOSIS — M2040 Other hammer toe(s) (acquired), unspecified foot: Secondary | ICD-10-CM | POA: Diagnosis not present

## 2011-12-24 DIAGNOSIS — L97509 Non-pressure chronic ulcer of other part of unspecified foot with unspecified severity: Secondary | ICD-10-CM | POA: Diagnosis not present

## 2012-01-29 DIAGNOSIS — Z87898 Personal history of other specified conditions: Secondary | ICD-10-CM | POA: Diagnosis not present

## 2012-01-29 DIAGNOSIS — R143 Flatulence: Secondary | ICD-10-CM | POA: Diagnosis not present

## 2012-01-29 DIAGNOSIS — R141 Gas pain: Secondary | ICD-10-CM | POA: Diagnosis not present

## 2012-01-29 DIAGNOSIS — R1084 Generalized abdominal pain: Secondary | ICD-10-CM | POA: Diagnosis not present

## 2012-01-29 DIAGNOSIS — Z86010 Personal history of colonic polyps: Secondary | ICD-10-CM | POA: Diagnosis not present

## 2012-01-29 DIAGNOSIS — R197 Diarrhea, unspecified: Secondary | ICD-10-CM | POA: Diagnosis not present

## 2012-01-29 DIAGNOSIS — R142 Eructation: Secondary | ICD-10-CM | POA: Diagnosis not present

## 2012-02-19 DIAGNOSIS — L97509 Non-pressure chronic ulcer of other part of unspecified foot with unspecified severity: Secondary | ICD-10-CM | POA: Diagnosis not present

## 2012-02-19 DIAGNOSIS — M2040 Other hammer toe(s) (acquired), unspecified foot: Secondary | ICD-10-CM | POA: Diagnosis not present

## 2012-03-10 DIAGNOSIS — IMO0001 Reserved for inherently not codable concepts without codable children: Secondary | ICD-10-CM | POA: Diagnosis not present

## 2012-03-10 DIAGNOSIS — E1149 Type 2 diabetes mellitus with other diabetic neurological complication: Secondary | ICD-10-CM | POA: Diagnosis not present

## 2012-03-26 DIAGNOSIS — L280 Lichen simplex chronicus: Secondary | ICD-10-CM | POA: Diagnosis not present

## 2012-03-26 DIAGNOSIS — D235 Other benign neoplasm of skin of trunk: Secondary | ICD-10-CM | POA: Diagnosis not present

## 2012-04-06 DIAGNOSIS — Z86010 Personal history of colonic polyps: Secondary | ICD-10-CM | POA: Diagnosis not present

## 2012-04-06 DIAGNOSIS — K5900 Constipation, unspecified: Secondary | ICD-10-CM | POA: Diagnosis not present

## 2012-04-06 DIAGNOSIS — Z87898 Personal history of other specified conditions: Secondary | ICD-10-CM | POA: Diagnosis not present

## 2012-04-27 DIAGNOSIS — I10 Essential (primary) hypertension: Secondary | ICD-10-CM | POA: Diagnosis not present

## 2012-04-27 DIAGNOSIS — E7800 Pure hypercholesterolemia, unspecified: Secondary | ICD-10-CM | POA: Diagnosis not present

## 2012-04-27 DIAGNOSIS — K219 Gastro-esophageal reflux disease without esophagitis: Secondary | ICD-10-CM | POA: Diagnosis not present

## 2012-04-27 DIAGNOSIS — N400 Enlarged prostate without lower urinary tract symptoms: Secondary | ICD-10-CM | POA: Diagnosis not present

## 2012-04-27 DIAGNOSIS — F17200 Nicotine dependence, unspecified, uncomplicated: Secondary | ICD-10-CM | POA: Diagnosis not present

## 2012-04-27 DIAGNOSIS — IMO0001 Reserved for inherently not codable concepts without codable children: Secondary | ICD-10-CM | POA: Diagnosis not present

## 2012-04-29 DIAGNOSIS — K219 Gastro-esophageal reflux disease without esophagitis: Secondary | ICD-10-CM | POA: Diagnosis not present

## 2012-04-29 DIAGNOSIS — IMO0001 Reserved for inherently not codable concepts without codable children: Secondary | ICD-10-CM | POA: Diagnosis not present

## 2012-04-29 DIAGNOSIS — G4700 Insomnia, unspecified: Secondary | ICD-10-CM | POA: Diagnosis not present

## 2012-04-29 DIAGNOSIS — E7800 Pure hypercholesterolemia, unspecified: Secondary | ICD-10-CM | POA: Diagnosis not present

## 2012-04-29 DIAGNOSIS — F411 Generalized anxiety disorder: Secondary | ICD-10-CM | POA: Diagnosis not present

## 2012-04-29 DIAGNOSIS — I10 Essential (primary) hypertension: Secondary | ICD-10-CM | POA: Diagnosis not present

## 2012-04-29 DIAGNOSIS — J45901 Unspecified asthma with (acute) exacerbation: Secondary | ICD-10-CM | POA: Diagnosis not present

## 2012-04-29 DIAGNOSIS — I209 Angina pectoris, unspecified: Secondary | ICD-10-CM | POA: Diagnosis not present

## 2012-05-07 DIAGNOSIS — L280 Lichen simplex chronicus: Secondary | ICD-10-CM | POA: Diagnosis not present

## 2012-05-19 ENCOUNTER — Encounter: Payer: Self-pay | Admitting: Cardiology

## 2012-06-09 ENCOUNTER — Ambulatory Visit (INDEPENDENT_AMBULATORY_CARE_PROVIDER_SITE_OTHER): Payer: Medicare Other | Admitting: Cardiovascular Disease

## 2012-06-09 ENCOUNTER — Encounter: Payer: Self-pay | Admitting: Cardiovascular Disease

## 2012-06-09 VITALS — BP 142/72 | HR 59 | Ht 75.0 in | Wt 257.0 lb

## 2012-06-09 DIAGNOSIS — N529 Male erectile dysfunction, unspecified: Secondary | ICD-10-CM

## 2012-06-09 DIAGNOSIS — I10 Essential (primary) hypertension: Secondary | ICD-10-CM

## 2012-06-09 DIAGNOSIS — E785 Hyperlipidemia, unspecified: Secondary | ICD-10-CM

## 2012-06-09 DIAGNOSIS — I2510 Atherosclerotic heart disease of native coronary artery without angina pectoris: Secondary | ICD-10-CM

## 2012-06-09 DIAGNOSIS — R609 Edema, unspecified: Secondary | ICD-10-CM | POA: Diagnosis not present

## 2012-06-09 MED ORDER — FUROSEMIDE 40 MG PO TABS: 40.0000 mg | ORAL_TABLET | Freq: Every day | ORAL | Status: DC

## 2012-06-09 MED ORDER — TADALAFIL 10 MG PO TABS: 10.0000 mg | ORAL_TABLET | Freq: Every day | ORAL | Status: DC | PRN

## 2012-06-09 NOTE — Assessment & Plan Note (Signed)
The patient has no symptoms suggestive of angina. Continue medical therapy.

## 2012-06-09 NOTE — Assessment & Plan Note (Signed)
This is stable on current dose of furosemide

## 2012-06-09 NOTE — Assessment & Plan Note (Signed)
Blood pressure is well controlled on current medications. 

## 2012-06-09 NOTE — Assessment & Plan Note (Signed)
Continue treatment with rosuvastatin given his history of CAD. Recommend yearly liver function testing.

## 2012-06-09 NOTE — Progress Notes (Signed)
HPI  The patient is a 62 year old male with history of coronary artery disease, stent placement to the mid RCA in 1999 and a normal Cardiolite study in 2009. The patient has known history of diabetic neuropathy, venous insufficiency and lymphedema all led to to prior nonhealing ulcers of the lower extremities as well as toe amputation. Previous ABIs were normal with no evidence of PAD.  From a cardiac perspective he is stable. He denies any chest pain shortness of breath orthopnea PND. He has no palpitations or syncope.   No Known Allergies   Current Outpatient Prescriptions on File Prior to Visit  Medication Sig Dispense Refill  . albuterol (VENTOLIN HFA) 108 (90 BASE) MCG/ACT inhaler Inhale 2 puffs into the lungs every 6 (six) hours as needed.      Marland Kitchen amLODipine (NORVASC) 10 MG tablet Take 10 mg by mouth daily.        Marland Kitchen aspirin 81 MG tablet Take 81 mg by mouth daily.        . carvedilol (COREG) 25 MG tablet Take 25 mg by mouth 2 (two) times daily with a meal.       . clonazePAM (KLONOPIN) 0.5 MG tablet Take 0.5 mg by mouth daily.       . furosemide (LASIX) 40 MG tablet Take 1 tablet (40 mg total) by mouth daily.  30 tablet  6  . insulin glargine (LANTUS) 100 UNIT/ML injection Inject 40 Units into the skin daily.       . Liraglutide (VICTOZA) 18 MG/3ML SOLN Inject 1.82 mg into the skin daily.        . metFORMIN (GLUCOPHAGE) 500 MG tablet Take 500 mg by mouth daily.        . Multiple Vitamin (MULTIVITAMIN) tablet Take 1 tablet by mouth daily.        Marland Kitchen omeprazole (PRILOSEC) 20 MG capsule Take 20 mg by mouth daily.        . ramipril (ALTACE) 10 MG tablet Take 10 mg by mouth daily.        . rosuvastatin (CRESTOR) 10 MG tablet Take 10 mg by mouth daily.        Marland Kitchen zolpidem (AMBIEN) 10 MG tablet Take 10 mg by mouth at bedtime as needed.      . tadalafil (CIALIS) 10 MG tablet Take 1 tablet (10 mg total) by mouth daily as needed for erectile dysfunction.  10 tablet  0     Past Medical History    Diagnosis Date  . CAD (coronary artery disease)     multivessel  . Two-vessel coronary artery disease     moderate. by cath in 2003. Status post stenting of the mdi RCA November 1999 normal left ventricular ejection fraction  . Dyslipidemia   . DM2 (diabetes mellitus, type 2)   . HTN (hypertension)   . COPD (chronic obstructive pulmonary disease)     with ongoing tobacco use and patient failed sham takes  . Morbid obesity   . 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 Chronic renal insufficiency   . Reflux esophagitis     hx  . 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      No past surgical history on file.   Family History  Problem Relation Age of Onset  . Diabetes Mother   . Alcohol abuse Father      History  Social History  . Marital Status: Single    Spouse Name: N/A    Number of Children: N/A  . Years of Education: N/A   Occupational History  . Not on file.   Social History Main Topics  . Smoking status: Current Every Day Smoker -- 1.0 packs/day for 40 years    Types: Cigarettes    Start date: 05/20/1968  . Smokeless tobacco: Never Used  . Alcohol Use: No  . Drug Use: No  . Sexually Active: Not on file   Other Topics Concern  . Not on file   Social History Narrative   Patient continues to smoke.      PHYSICAL EXAM   BP 142/72  Pulse 59  Ht 6\' 3"  (1.905 m)  Wt 257 lb (116.574 kg)  BMI 32.12 kg/m2  SpO2 96% Constitutional: He is oriented to person, place, and time. He appears well-developed and well-nourished. No distress.  HENT: No nasal discharge.  Head: Normocephalic and atraumatic.  Eyes: Pupils are equal and round. Right eye exhibits no discharge. Left eye exhibits no discharge.  Neck: Normal range of motion. Neck supple. No JVD present. No thyromegaly present.  Cardiovascular: Normal rate, regular rhythm, normal heart sounds  and. Exam reveals no gallop and no friction rub. No murmur heard.  Pulmonary/Chest: Effort normal and breath sounds normal. No stridor. No respiratory distress. He has no wheezes. He has no rales. He exhibits no tenderness.  Abdominal: Soft. Bowel sounds are normal. He exhibits no distension. There is no tenderness. There is no rebound and no guarding.  Musculoskeletal: Normal range of motion. He exhibits +1 edema and no tenderness.  Neurological: He is alert and oriented to person, place, and time. Coordination normal.  Skin: Skin is warm and dry. Mild stasis dermatitis. He is not diaphoretic. No erythema. No pallor.  Psychiatric: He has a normal mood and affect. His behavior is normal. Judgment and thought content normal.       EKG: Normal sinus rhythm with nonspecific T wave changes.   ASSESSMENT AND PLAN

## 2012-06-09 NOTE — Assessment & Plan Note (Signed)
The patient complains of significant erectile dysfunction in spite of daily use of low-dose Cialis. He asked if he could use as needed Viagra in addition to Cialis. I explained to him that it's better to stay with the same medication and agreed to give him one month supply of Cialis 10 mg to be used as needed. I asked him to discuss this with Dr. Nadara Mustard as well. He knows not to take these kind of medications with nitroglycerin.

## 2012-06-09 NOTE — Patient Instructions (Addendum)
Continue same medications.  Can take Cialis 10 mg as needed.  Follow up in 1 year.

## 2012-06-11 DIAGNOSIS — L280 Lichen simplex chronicus: Secondary | ICD-10-CM | POA: Diagnosis not present

## 2012-09-08 DIAGNOSIS — E669 Obesity, unspecified: Secondary | ICD-10-CM | POA: Diagnosis not present

## 2012-09-08 DIAGNOSIS — E1149 Type 2 diabetes mellitus with other diabetic neurological complication: Secondary | ICD-10-CM | POA: Diagnosis not present

## 2012-09-08 DIAGNOSIS — F17200 Nicotine dependence, unspecified, uncomplicated: Secondary | ICD-10-CM | POA: Diagnosis not present

## 2012-09-08 DIAGNOSIS — E1142 Type 2 diabetes mellitus with diabetic polyneuropathy: Secondary | ICD-10-CM | POA: Diagnosis not present

## 2012-09-22 DIAGNOSIS — E1149 Type 2 diabetes mellitus with other diabetic neurological complication: Secondary | ICD-10-CM | POA: Diagnosis not present

## 2012-10-06 DIAGNOSIS — L97509 Non-pressure chronic ulcer of other part of unspecified foot with unspecified severity: Secondary | ICD-10-CM | POA: Diagnosis not present

## 2012-10-27 DIAGNOSIS — L97509 Non-pressure chronic ulcer of other part of unspecified foot with unspecified severity: Secondary | ICD-10-CM | POA: Diagnosis not present

## 2012-10-29 DIAGNOSIS — E7800 Pure hypercholesterolemia, unspecified: Secondary | ICD-10-CM | POA: Diagnosis not present

## 2012-10-29 DIAGNOSIS — I209 Angina pectoris, unspecified: Secondary | ICD-10-CM | POA: Diagnosis not present

## 2012-10-29 DIAGNOSIS — J45901 Unspecified asthma with (acute) exacerbation: Secondary | ICD-10-CM | POA: Diagnosis not present

## 2012-10-29 DIAGNOSIS — F411 Generalized anxiety disorder: Secondary | ICD-10-CM | POA: Diagnosis not present

## 2012-10-29 DIAGNOSIS — G4700 Insomnia, unspecified: Secondary | ICD-10-CM | POA: Diagnosis not present

## 2012-10-29 DIAGNOSIS — N400 Enlarged prostate without lower urinary tract symptoms: Secondary | ICD-10-CM | POA: Diagnosis not present

## 2012-10-29 DIAGNOSIS — I10 Essential (primary) hypertension: Secondary | ICD-10-CM | POA: Diagnosis not present

## 2012-10-29 DIAGNOSIS — K219 Gastro-esophageal reflux disease without esophagitis: Secondary | ICD-10-CM | POA: Diagnosis not present

## 2012-10-29 DIAGNOSIS — IMO0001 Reserved for inherently not codable concepts without codable children: Secondary | ICD-10-CM | POA: Diagnosis not present

## 2012-11-03 DIAGNOSIS — T148 Other injury of unspecified body region: Secondary | ICD-10-CM | POA: Diagnosis not present

## 2012-11-03 DIAGNOSIS — E785 Hyperlipidemia, unspecified: Secondary | ICD-10-CM | POA: Diagnosis present

## 2012-11-03 DIAGNOSIS — M79609 Pain in unspecified limb: Secondary | ICD-10-CM

## 2012-11-03 DIAGNOSIS — Z79899 Other long term (current) drug therapy: Secondary | ICD-10-CM | POA: Diagnosis not present

## 2012-11-03 DIAGNOSIS — Z794 Long term (current) use of insulin: Secondary | ICD-10-CM | POA: Diagnosis not present

## 2012-11-03 DIAGNOSIS — F17200 Nicotine dependence, unspecified, uncomplicated: Secondary | ICD-10-CM | POA: Diagnosis present

## 2012-11-03 DIAGNOSIS — S21109A Unspecified open wound of unspecified front wall of thorax without penetration into thoracic cavity, initial encounter: Secondary | ICD-10-CM | POA: Diagnosis not present

## 2012-11-03 DIAGNOSIS — A6920 Lyme disease, unspecified: Secondary | ICD-10-CM | POA: Diagnosis present

## 2012-11-03 DIAGNOSIS — Z9861 Coronary angioplasty status: Secondary | ICD-10-CM | POA: Diagnosis not present

## 2012-11-03 DIAGNOSIS — K219 Gastro-esophageal reflux disease without esophagitis: Secondary | ICD-10-CM | POA: Diagnosis present

## 2012-11-03 DIAGNOSIS — N179 Acute kidney failure, unspecified: Secondary | ICD-10-CM | POA: Diagnosis not present

## 2012-11-03 DIAGNOSIS — I129 Hypertensive chronic kidney disease with stage 1 through stage 4 chronic kidney disease, or unspecified chronic kidney disease: Secondary | ICD-10-CM | POA: Diagnosis present

## 2012-11-03 DIAGNOSIS — L97409 Non-pressure chronic ulcer of unspecified heel and midfoot with unspecified severity: Secondary | ICD-10-CM | POA: Diagnosis not present

## 2012-11-03 DIAGNOSIS — W57XXXA Bitten or stung by nonvenomous insect and other nonvenomous arthropods, initial encounter: Secondary | ICD-10-CM | POA: Diagnosis not present

## 2012-11-03 DIAGNOSIS — J154 Pneumonia due to other streptococci: Secondary | ICD-10-CM | POA: Diagnosis present

## 2012-11-03 DIAGNOSIS — F411 Generalized anxiety disorder: Secondary | ICD-10-CM | POA: Diagnosis present

## 2012-11-03 DIAGNOSIS — R0989 Other specified symptoms and signs involving the circulatory and respiratory systems: Secondary | ICD-10-CM | POA: Diagnosis not present

## 2012-11-03 DIAGNOSIS — J189 Pneumonia, unspecified organism: Secondary | ICD-10-CM | POA: Diagnosis not present

## 2012-11-03 DIAGNOSIS — N182 Chronic kidney disease, stage 2 (mild): Secondary | ICD-10-CM | POA: Diagnosis present

## 2012-11-03 DIAGNOSIS — I2510 Atherosclerotic heart disease of native coronary artery without angina pectoris: Secondary | ICD-10-CM | POA: Diagnosis present

## 2012-11-04 DIAGNOSIS — J189 Pneumonia, unspecified organism: Secondary | ICD-10-CM | POA: Diagnosis not present

## 2012-11-04 DIAGNOSIS — S21109A Unspecified open wound of unspecified front wall of thorax without penetration into thoracic cavity, initial encounter: Secondary | ICD-10-CM | POA: Diagnosis not present

## 2012-11-04 DIAGNOSIS — W57XXXA Bitten or stung by nonvenomous insect and other nonvenomous arthropods, initial encounter: Secondary | ICD-10-CM | POA: Diagnosis not present

## 2012-11-05 DIAGNOSIS — J189 Pneumonia, unspecified organism: Secondary | ICD-10-CM | POA: Diagnosis not present

## 2012-11-05 DIAGNOSIS — T148 Other injury of unspecified body region: Secondary | ICD-10-CM | POA: Diagnosis not present

## 2012-11-05 DIAGNOSIS — W57XXXA Bitten or stung by nonvenomous insect and other nonvenomous arthropods, initial encounter: Secondary | ICD-10-CM | POA: Diagnosis not present

## 2012-11-09 DIAGNOSIS — E119 Type 2 diabetes mellitus without complications: Secondary | ICD-10-CM | POA: Diagnosis not present

## 2012-11-09 DIAGNOSIS — I10 Essential (primary) hypertension: Secondary | ICD-10-CM | POA: Diagnosis not present

## 2012-11-09 DIAGNOSIS — A419 Sepsis, unspecified organism: Secondary | ICD-10-CM | POA: Diagnosis not present

## 2012-11-10 DIAGNOSIS — L97509 Non-pressure chronic ulcer of other part of unspecified foot with unspecified severity: Secondary | ICD-10-CM | POA: Diagnosis not present

## 2012-11-17 DIAGNOSIS — A779 Spotted fever, unspecified: Secondary | ICD-10-CM | POA: Diagnosis not present

## 2012-11-24 DIAGNOSIS — L97509 Non-pressure chronic ulcer of other part of unspecified foot with unspecified severity: Secondary | ICD-10-CM | POA: Diagnosis not present

## 2012-12-11 DIAGNOSIS — L97509 Non-pressure chronic ulcer of other part of unspecified foot with unspecified severity: Secondary | ICD-10-CM | POA: Diagnosis not present

## 2012-12-21 DIAGNOSIS — A779 Spotted fever, unspecified: Secondary | ICD-10-CM | POA: Diagnosis not present

## 2012-12-21 DIAGNOSIS — R5383 Other fatigue: Secondary | ICD-10-CM | POA: Diagnosis not present

## 2012-12-21 DIAGNOSIS — R5381 Other malaise: Secondary | ICD-10-CM | POA: Diagnosis not present

## 2012-12-25 DIAGNOSIS — L97509 Non-pressure chronic ulcer of other part of unspecified foot with unspecified severity: Secondary | ICD-10-CM | POA: Diagnosis not present

## 2013-01-08 DIAGNOSIS — L97509 Non-pressure chronic ulcer of other part of unspecified foot with unspecified severity: Secondary | ICD-10-CM | POA: Diagnosis not present

## 2013-01-29 DIAGNOSIS — E1149 Type 2 diabetes mellitus with other diabetic neurological complication: Secondary | ICD-10-CM | POA: Diagnosis not present

## 2013-01-29 DIAGNOSIS — E1169 Type 2 diabetes mellitus with other specified complication: Secondary | ICD-10-CM | POA: Diagnosis not present

## 2013-01-29 DIAGNOSIS — L97509 Non-pressure chronic ulcer of other part of unspecified foot with unspecified severity: Secondary | ICD-10-CM | POA: Diagnosis not present

## 2013-02-12 DIAGNOSIS — L97509 Non-pressure chronic ulcer of other part of unspecified foot with unspecified severity: Secondary | ICD-10-CM | POA: Diagnosis not present

## 2013-02-25 DIAGNOSIS — L97409 Non-pressure chronic ulcer of unspecified heel and midfoot with unspecified severity: Secondary | ICD-10-CM | POA: Diagnosis not present

## 2013-03-01 DIAGNOSIS — K219 Gastro-esophageal reflux disease without esophagitis: Secondary | ICD-10-CM | POA: Diagnosis not present

## 2013-03-01 DIAGNOSIS — E669 Obesity, unspecified: Secondary | ICD-10-CM | POA: Diagnosis not present

## 2013-03-01 DIAGNOSIS — E7800 Pure hypercholesterolemia, unspecified: Secondary | ICD-10-CM | POA: Diagnosis not present

## 2013-03-01 DIAGNOSIS — F17200 Nicotine dependence, unspecified, uncomplicated: Secondary | ICD-10-CM | POA: Diagnosis not present

## 2013-03-01 DIAGNOSIS — I10 Essential (primary) hypertension: Secondary | ICD-10-CM | POA: Diagnosis not present

## 2013-03-01 DIAGNOSIS — M129 Arthropathy, unspecified: Secondary | ICD-10-CM | POA: Diagnosis not present

## 2013-03-01 DIAGNOSIS — E119 Type 2 diabetes mellitus without complications: Secondary | ICD-10-CM | POA: Diagnosis not present

## 2013-03-08 DIAGNOSIS — E119 Type 2 diabetes mellitus without complications: Secondary | ICD-10-CM | POA: Diagnosis not present

## 2013-03-08 DIAGNOSIS — J438 Other emphysema: Secondary | ICD-10-CM | POA: Diagnosis not present

## 2013-03-08 DIAGNOSIS — R609 Edema, unspecified: Secondary | ICD-10-CM | POA: Diagnosis not present

## 2013-03-08 DIAGNOSIS — I10 Essential (primary) hypertension: Secondary | ICD-10-CM | POA: Diagnosis not present

## 2013-03-10 DIAGNOSIS — E1142 Type 2 diabetes mellitus with diabetic polyneuropathy: Secondary | ICD-10-CM | POA: Diagnosis not present

## 2013-03-10 DIAGNOSIS — F17200 Nicotine dependence, unspecified, uncomplicated: Secondary | ICD-10-CM | POA: Diagnosis not present

## 2013-03-10 DIAGNOSIS — E1129 Type 2 diabetes mellitus with other diabetic kidney complication: Secondary | ICD-10-CM | POA: Diagnosis not present

## 2013-03-10 DIAGNOSIS — N183 Chronic kidney disease, stage 3 unspecified: Secondary | ICD-10-CM | POA: Diagnosis not present

## 2013-03-10 DIAGNOSIS — E1149 Type 2 diabetes mellitus with other diabetic neurological complication: Secondary | ICD-10-CM | POA: Diagnosis not present

## 2013-03-10 DIAGNOSIS — E669 Obesity, unspecified: Secondary | ICD-10-CM | POA: Diagnosis not present

## 2013-03-11 DIAGNOSIS — L97509 Non-pressure chronic ulcer of other part of unspecified foot with unspecified severity: Secondary | ICD-10-CM | POA: Diagnosis not present

## 2013-03-11 DIAGNOSIS — L97409 Non-pressure chronic ulcer of unspecified heel and midfoot with unspecified severity: Secondary | ICD-10-CM | POA: Diagnosis not present

## 2013-03-19 DIAGNOSIS — L97409 Non-pressure chronic ulcer of unspecified heel and midfoot with unspecified severity: Secondary | ICD-10-CM | POA: Diagnosis not present

## 2013-03-26 DIAGNOSIS — L97509 Non-pressure chronic ulcer of other part of unspecified foot with unspecified severity: Secondary | ICD-10-CM | POA: Diagnosis not present

## 2013-04-02 DIAGNOSIS — L97509 Non-pressure chronic ulcer of other part of unspecified foot with unspecified severity: Secondary | ICD-10-CM | POA: Diagnosis not present

## 2013-04-09 DIAGNOSIS — E1069 Type 1 diabetes mellitus with other specified complication: Secondary | ICD-10-CM | POA: Diagnosis not present

## 2013-04-09 DIAGNOSIS — L97509 Non-pressure chronic ulcer of other part of unspecified foot with unspecified severity: Secondary | ICD-10-CM | POA: Diagnosis not present

## 2013-04-09 DIAGNOSIS — E1149 Type 2 diabetes mellitus with other diabetic neurological complication: Secondary | ICD-10-CM | POA: Diagnosis not present

## 2013-04-20 DIAGNOSIS — L97509 Non-pressure chronic ulcer of other part of unspecified foot with unspecified severity: Secondary | ICD-10-CM | POA: Diagnosis not present

## 2013-04-20 DIAGNOSIS — E1069 Type 1 diabetes mellitus with other specified complication: Secondary | ICD-10-CM | POA: Diagnosis not present

## 2013-04-20 DIAGNOSIS — E1149 Type 2 diabetes mellitus with other diabetic neurological complication: Secondary | ICD-10-CM | POA: Diagnosis not present

## 2013-04-27 DIAGNOSIS — L97509 Non-pressure chronic ulcer of other part of unspecified foot with unspecified severity: Secondary | ICD-10-CM | POA: Diagnosis not present

## 2013-04-27 DIAGNOSIS — E1069 Type 1 diabetes mellitus with other specified complication: Secondary | ICD-10-CM | POA: Diagnosis not present

## 2013-04-27 DIAGNOSIS — E1149 Type 2 diabetes mellitus with other diabetic neurological complication: Secondary | ICD-10-CM | POA: Diagnosis not present

## 2013-05-04 DIAGNOSIS — L97509 Non-pressure chronic ulcer of other part of unspecified foot with unspecified severity: Secondary | ICD-10-CM | POA: Diagnosis not present

## 2013-05-04 DIAGNOSIS — E1149 Type 2 diabetes mellitus with other diabetic neurological complication: Secondary | ICD-10-CM | POA: Diagnosis not present

## 2013-05-04 DIAGNOSIS — E1069 Type 1 diabetes mellitus with other specified complication: Secondary | ICD-10-CM | POA: Diagnosis not present

## 2013-05-25 DIAGNOSIS — L97509 Non-pressure chronic ulcer of other part of unspecified foot with unspecified severity: Secondary | ICD-10-CM | POA: Diagnosis not present

## 2013-06-15 DIAGNOSIS — E1149 Type 2 diabetes mellitus with other diabetic neurological complication: Secondary | ICD-10-CM | POA: Diagnosis not present

## 2013-06-15 DIAGNOSIS — L97509 Non-pressure chronic ulcer of other part of unspecified foot with unspecified severity: Secondary | ICD-10-CM | POA: Diagnosis not present

## 2013-07-23 ENCOUNTER — Encounter: Payer: Self-pay | Admitting: Cardiology

## 2013-07-23 ENCOUNTER — Ambulatory Visit (INDEPENDENT_AMBULATORY_CARE_PROVIDER_SITE_OTHER): Payer: Medicare Other | Admitting: Cardiology

## 2013-07-23 VITALS — BP 165/73 | HR 71 | Ht 75.0 in | Wt 251.0 lb

## 2013-07-23 DIAGNOSIS — E785 Hyperlipidemia, unspecified: Secondary | ICD-10-CM | POA: Diagnosis not present

## 2013-07-23 DIAGNOSIS — I2510 Atherosclerotic heart disease of native coronary artery without angina pectoris: Secondary | ICD-10-CM

## 2013-07-23 DIAGNOSIS — I10 Essential (primary) hypertension: Secondary | ICD-10-CM | POA: Diagnosis not present

## 2013-07-23 DIAGNOSIS — R0602 Shortness of breath: Secondary | ICD-10-CM | POA: Diagnosis not present

## 2013-07-23 DIAGNOSIS — R002 Palpitations: Secondary | ICD-10-CM

## 2013-07-23 MED ORDER — CARVEDILOL 25 MG PO TABS: 37.5000 mg | ORAL_TABLET | Freq: Two times a day (BID) | ORAL | Status: DC

## 2013-07-23 NOTE — Patient Instructions (Signed)
Your physician has recommended that you have a pulmonary function test. Pulmonary Function Tests are a group of tests that measure how well air moves in and out of your lungs. Office will contact with results via phone or letter.   Increase Coreg to 37.5mg  twice a day  - new sent to pharm Continue all other medications.   Follow up in  2 months

## 2013-07-23 NOTE — Progress Notes (Signed)
Clinical Summary [name] is a 63 y.o.male former patient of Dr Dannielle Burn, this is our first visit together. He is seen for the following medical problems.   1. CAD - prior stent to RCA in 1999. Last cath 05/2001 : LM 20-30%, LAD 50-60%  And 60-70% mid, LCX with mid AV portion 60-70%, RCA with patent stent with proximal 40-50% disease. LVEF by LVgram 65%, LVEDP 20. Overall moderate lesions, not best anatomically for PCI per notes.  - occas tightness in chest, every 2-3 weeks. Typically tightness, can feel heart thumping for a few minutes. Ongoing 3-4 months. Typically occurs at rest. Stable mild DOE with high activity - compliant with meds  2. Hyperlipidemia - compliant with crestor - panel pending in April  3. HTN - does not check regularly - compliant with meds  4. Palpitations -1 cup of coffee daily, drinks heavy sodas - occasional palpitations as described above   Past Medical History  Diagnosis Date  . CAD (coronary artery disease)     multivessel  . Two-vessel coronary artery disease     moderate. by cath in 2003. Status post stenting of the mdi RCA November 1999 normal left ventricular ejection fraction  . Dyslipidemia   . DM2 (diabetes mellitus, type 2)   . HTN (hypertension)   . COPD (chronic obstructive pulmonary disease)     with ongoing tobacco use and patient failed sham takes  . Morbid obesity   . 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 Chronic renal insufficiency   . Reflux esophagitis     hx  . 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      No Known Allergies   Current Outpatient Prescriptions  Medication Sig Dispense Refill  . albuterol (VENTOLIN HFA) 108 (90 BASE) MCG/ACT inhaler Inhale 2 puffs into the lungs every 6 (six) hours as needed.      Marland Kitchen amLODipine (NORVASC) 10 MG tablet Take 10 mg by mouth daily.        Marland Kitchen aspirin 81 MG  tablet Take 81 mg by mouth daily.        . carvedilol (COREG) 25 MG tablet Take 25 mg by mouth 2 (two) times daily with a meal.       . clonazePAM (KLONOPIN) 0.5 MG tablet Take 0.5 mg by mouth daily.       . furosemide (LASIX) 40 MG tablet Take 1 tablet (40 mg total) by mouth daily.  30 tablet  6  . insulin glargine (LANTUS) 100 UNIT/ML injection Inject 40 Units into the skin daily.       . Liraglutide (VICTOZA) 18 MG/3ML SOLN Inject 1.82 mg into the skin daily.        . metFORMIN (GLUCOPHAGE) 500 MG tablet Take 500 mg by mouth daily.        . Multiple Vitamin (MULTIVITAMIN) tablet Take 1 tablet by mouth daily.        Marland Kitchen omeprazole (PRILOSEC) 20 MG capsule Take 20 mg by mouth daily.        . ramipril (ALTACE) 10 MG tablet Take 10 mg by mouth daily.        . rosuvastatin (CRESTOR) 10 MG tablet Take 10 mg by mouth daily.        . tadalafil (CIALIS) 10 MG tablet Take 1 tablet (10 mg total) by mouth daily as needed for erectile  dysfunction.  10 tablet  0  . Tadalafil (CIALIS) 2.5 MG TABS Take 1 tablet by mouth daily.      Marland Kitchen zolpidem (AMBIEN) 10 MG tablet Take 10 mg by mouth at bedtime as needed.       No current facility-administered medications for this visit.     No past surgical history on file.   No Known Allergies    Family History  Problem Relation Age of Onset  . Diabetes Mother   . Alcohol abuse Father      Social History Mr. Kallenbach reports that he has been smoking Cigarettes.  He started smoking about 45 years ago. He has a 40 pack-year smoking history. He has never used smokeless tobacco. Mr. Anderer reports that he does not drink alcohol.   Review of Systems CONSTITUTIONAL: No weight loss, fever, chills, weakness or fatigue.  HEENT: Eyes: No visual loss, blurred vision, double vision or yellow sclerae.No hearing loss, sneezing, congestion, runny nose or sore throat.  SKIN: No rash or itching. Per HPI RESPIRATORY: No shortness of breath, cough or sputum.    GASTROINTESTINAL: No anorexia, nausea, vomiting or diarrhea. No abdominal pain or blood.  GENITOURINARY: No burning on urination, no polyuria NEUROLOGICAL: No headache, dizziness, syncope, paralysis, ataxia, numbness or tingling in the extremities. No change in bowel or bladder control.  MUSCULOSKELETAL: No muscle, back pain, joint pain or stiffness.  LYMPHATICS: No enlarged nodes. No history of splenectomy.  PSYCHIATRIC: No history of depression or anxiety.  ENDOCRINOLOGIC: No reports of sweating, cold or heat intolerance. No polyuria or polydipsia.  Marland Kitchen   Physical Examination p 71 bp 165/73 Wt 251 lbs BMI 31 Gen: resting comfortably, no acute distress HEENT: no scleral icterus, pupils equal round and reactive, no palptable cervical adenopathy,  CV: RRR, no m/r/g, no JVD, no carotid bruits Resp: Clear to auscultation bilaterally GI: abdomen is soft, non-tender, non-distended, normal bowel sounds, no hepatosplenomegaly MSK: extremities are warm, no edema.  Skin: warm, no rash Neuro:  no focal deficits Psych: appropriate affect   Diagnostic Studies 05/2001 Cath FINDINGS:  1. Left main trunk: Medium caliber vessel. This is a long vessel with a  distal taper of 20-30%.  2. Left anterior descending artery: This is a medium caliber vessel that  provides a large bifurcating first diagonal Azaiah Licciardi in the proximal segment  and before then extending to the apex, the LAD tapers significantly after  the diagonal Hadleigh Felber. There is moderate disease of 50-60% after the  diagonal Mazell Aylesworth and then a focal eccentric narrowing of 60-70% in the mid  section of the LAD. The diagonal Haven Foss has moderate disease of 30-40% in  the proximal segment. The lateral division is a small caliber vessel with  an ostial narrowing of 60%.  3. Left circumflex artery: This is a small caliber vessel that provides a  trivial first marginal Derek Huneycutt in the mid section and a medium caliber  second marginal Tangy Drozdowski distally.  The mid AV circumflex at the takeoff of  the first and second diagonal branches had moderate diffuse disease of  60-70%.  4. Right coronary artery: Dominant. This is a large caliber vessel that  provides a posterior descending artery and two posteroventricular branches  in the terminal segment. The right coronary artery has evidence of an  existing stent in the mid section that is widely patent. Prior to stent is  a focal discrete narrowing of 40-50%. Distal to the stent is moderate  disease of 30%. The distal Abdias Hickam vessels  also have mild disease of 30%.  5. Left ventricle: Normal end-systolic and end-diastolic dimensions. Overall  left ventricular function is well preserved. Ejection fraction is greater  than 65%. No mitral regurgitation. LV pressure is 180/10, aortic is  180/90. LV EDP equals 20.  ASSESSMENT AND PLAN: Mr. Boggs is a 63 year old gentleman with moderate  three-vessel coronary artery disease. The patients plaque burden in the left  anterior descending artery and circumflex appears to have increased somewhat  compared to his previous angiogram. Unfortunately, the mid and distal left  anterior descending artery is a small caliber vessel with a long diffuse  segment of disease and would be associated with a high restenosis rate.  Similarly, the circumflex would suffer the same fate.  Further assessment with a stress imaging study would be prudent to isolate the  area of ischemia. Percutaneous intervention may then be targeted if  indicated. In addition, aggressive medical therapy should be pursued as the  patient currently is not on any anti-anginal therapy and has poorly controlled  hypertension. The patient will also be counseled regarding smoking cessation.     Assessment and Plan  1. CAD - no current chest pain, occasional palpitations with associated chest thumping feeling - continue risk factor modification and secondary prevention  2. Hyperlipidemia - continue  crestor, follow up repeat lipid panel by PCP  3. HTN - elevated, will increase coreg and follw  4. Palpitations - will increase coreg and follow symptoms, if persists or progresses consider heart monitor at next visit - counseled to decrease caffeine intake  5. Tobacco - describes occasional SOB with activity, long tobacco history, will obtain PFTs.    Follow up 2 months     Arnoldo Lenis, M.D., F.A.C.C.

## 2013-08-04 ENCOUNTER — Ambulatory Visit (HOSPITAL_COMMUNITY)
Admission: RE | Admit: 2013-08-04 | Discharge: 2013-08-04 | Disposition: A | Discharge status: Home / Self Care | Payer: Medicare Other | Source: Ambulatory Visit | Attending: Cardiology | Admitting: Cardiology

## 2013-08-04 DIAGNOSIS — R0602 Shortness of breath: Secondary | ICD-10-CM | POA: Diagnosis not present

## 2013-08-04 LAB — PULMONARY FUNCTION TEST
DL/VA % pred: 79 %
DL/VA: 3.91 ml/min/mmHg/L
DLCO cor % pred: 67 %
DLCO cor: 27.28 ml/min/mmHg
DLCO unc % pred: 69 %
DLCO unc: 28.02 ml/min/mmHg
FEF 25-75 Post: 2.18 L/sec
FEF 25-75 Pre: 1.98 L/sec
FEF2575-%Change-Post: 9 %
FEF2575-%Pred-Post: 61 %
FEF2575-%Pred-Pre: 56 %
FEV1-%Change-Post: 2 %
FEV1-%Pred-Post: 71 %
FEV1-%Pred-Pre: 69 %
FEV1-Post: 3.13 L
FEV1-Pre: 3.04 L
FEV1FVC-%Change-Post: 0 %
FEV1FVC-%Pred-Pre: 92 %
FEV6-%Change-Post: 3 %
FEV6-%Pred-Post: 79 %
FEV6-%Pred-Pre: 77 %
FEV6-Post: 4.43 L
FEV6-Pre: 4.29 L
FEV6FVC-%Change-Post: 0 %
FEV6FVC-%Pred-Post: 102 %
FEV6FVC-%Pred-Pre: 103 %
FVC-%Change-Post: 3 %
FVC-%Pred-Post: 77 %
FVC-%Pred-Pre: 74 %
FVC-Post: 4.48 L
FVC-Pre: 4.34 L
Post FEV1/FVC ratio: 70 %
Post FEV6/FVC ratio: 99 %
Pre FEV1/FVC ratio: 70 %
Pre FEV6/FVC Ratio: 99 %
RV % pred: 139 %
RV: 3.6 L
TLC % pred: 96 %
TLC: 7.91 L

## 2013-08-04 LAB — BLOOD GAS, ARTERIAL
Acid-Base Excess: 0 mmol/L (ref 0.0–2.0)
Bicarbonate: 24 mEq/L (ref 20.0–24.0)
FIO2: 0.21 %
O2 Saturation: 94.9 %
Patient temperature: 37
TCO2: 20.6 mmol/L (ref 0–100)
pCO2 arterial: 38 mmHg (ref 35.0–45.0)
pH, Arterial: 7.417 (ref 7.350–7.450)
pO2, Arterial: 75.5 mmHg — ABNORMAL LOW (ref 80.0–100.0)

## 2013-08-04 MED ORDER — ALBUTEROL SULFATE (2.5 MG/3ML) 0.083% IN NEBU
2.5000 mg | INHALATION_SOLUTION | Freq: Once | RESPIRATORY_TRACT | Status: AC
  Administered 2013-08-04: 2.5 mg via RESPIRATORY_TRACT

## 2013-08-06 NOTE — Procedures (Signed)
NAMEHALL, ASHBECK               ACCOUNT NO.:  [id number]  MEDICAL RECORD NO.:  [id number]  LOCATION:  RESP                          FACILITY:  [facility]  PHYSICIAN:  Janzen Sacks L. Luan Pulling, M.D.DATE OF BIRTH:  02-Sep-1950  DATE OF PROCEDURE: DATE OF DISCHARGE:  08/04/2013                           PULMONARY FUNCTION TEST   Reason for pulmonary function testing is shortness of breath. 1. Spirometry shows a mild ventilatory defect with evidence of airflow     obstruction. 2. Lung volumes show normal total lung capacity, but evidence of air     trapping. 3. Airway resistance is mildly elevated confirming the presence of     airflow obstruction. 4. DLCO is mildly reduced. 5. Arterial blood gas is normal. 6. This study is consistent with COPD.     Nicholaos Schippers L. Luan Pulling, M.D.     EL[facility]/MEDQ  D:  08/06/2013  T:  08/06/2013  Job:  [id number]

## 2013-08-09 ENCOUNTER — Telehealth: Payer: Self-pay | Admitting: *Deleted

## 2013-08-09 NOTE — Telephone Encounter (Signed)
Message copied by Laurine Blazer on Mon Aug 09, 2013 10:27 AM ------      Message from: [place] F      Created: Mon Aug 09, 2013  9:15 AM       Please let patient know that lung tests show some findings suggestive of COPD. Please forward results to PCP and have him follow up            Carlyle Dolly MD ------

## 2013-08-09 NOTE — Telephone Encounter (Signed)
Notes Recorded by Laurine Blazer, LPN on [id number] at [id number] AM Patient notified and verbalized understanding. Will forward copy to PMD Nadara Mustard). Has follow up with Dr. [name] in May. Thinks he is due to she PMD next month.

## 2013-08-24 DIAGNOSIS — E119 Type 2 diabetes mellitus without complications: Secondary | ICD-10-CM | POA: Diagnosis not present

## 2013-08-24 DIAGNOSIS — L97509 Non-pressure chronic ulcer of other part of unspecified foot with unspecified severity: Secondary | ICD-10-CM | POA: Diagnosis not present

## 2013-08-30 DIAGNOSIS — I10 Essential (primary) hypertension: Secondary | ICD-10-CM | POA: Diagnosis not present

## 2013-08-30 DIAGNOSIS — E7800 Pure hypercholesterolemia, unspecified: Secondary | ICD-10-CM | POA: Diagnosis not present

## 2013-08-30 DIAGNOSIS — F17200 Nicotine dependence, unspecified, uncomplicated: Secondary | ICD-10-CM | POA: Diagnosis not present

## 2013-08-30 DIAGNOSIS — E119 Type 2 diabetes mellitus without complications: Secondary | ICD-10-CM | POA: Diagnosis not present

## 2013-09-07 DIAGNOSIS — J438 Other emphysema: Secondary | ICD-10-CM | POA: Diagnosis not present

## 2013-09-07 DIAGNOSIS — E119 Type 2 diabetes mellitus without complications: Secondary | ICD-10-CM | POA: Diagnosis not present

## 2013-09-07 DIAGNOSIS — E7800 Pure hypercholesterolemia, unspecified: Secondary | ICD-10-CM | POA: Diagnosis not present

## 2013-09-07 DIAGNOSIS — N189 Chronic kidney disease, unspecified: Secondary | ICD-10-CM | POA: Diagnosis not present

## 2013-09-07 DIAGNOSIS — R809 Proteinuria, unspecified: Secondary | ICD-10-CM | POA: Diagnosis not present

## 2013-09-07 DIAGNOSIS — F17200 Nicotine dependence, unspecified, uncomplicated: Secondary | ICD-10-CM | POA: Diagnosis not present

## 2013-09-13 DIAGNOSIS — N181 Chronic kidney disease, stage 1: Secondary | ICD-10-CM | POA: Diagnosis not present

## 2013-09-16 DIAGNOSIS — R809 Proteinuria, unspecified: Secondary | ICD-10-CM | POA: Diagnosis not present

## 2013-09-23 ENCOUNTER — Encounter: Payer: Self-pay | Admitting: Cardiology

## 2013-09-23 ENCOUNTER — Ambulatory Visit (INDEPENDENT_AMBULATORY_CARE_PROVIDER_SITE_OTHER): Payer: Medicare Other | Admitting: Cardiology

## 2013-09-23 VITALS — BP 149/48 | HR 71 | Ht 75.0 in | Wt 252.0 lb

## 2013-09-23 DIAGNOSIS — I2510 Atherosclerotic heart disease of native coronary artery without angina pectoris: Secondary | ICD-10-CM

## 2013-09-23 DIAGNOSIS — Z79899 Other long term (current) drug therapy: Secondary | ICD-10-CM | POA: Diagnosis not present

## 2013-09-23 DIAGNOSIS — I10 Essential (primary) hypertension: Secondary | ICD-10-CM | POA: Diagnosis not present

## 2013-09-23 DIAGNOSIS — R002 Palpitations: Secondary | ICD-10-CM

## 2013-09-23 DIAGNOSIS — E785 Hyperlipidemia, unspecified: Secondary | ICD-10-CM

## 2013-09-23 MED ORDER — HYDROCHLOROTHIAZIDE 25 MG PO TABS: 25.0000 mg | ORAL_TABLET | Freq: Every day | ORAL | Status: DC

## 2013-09-23 NOTE — Progress Notes (Signed)
Clinical Summary Mr. Brasuell is a 63 y.o.male seen today for follow up of the following medical problems.  1. CAD  - prior stent to RCA in 1999. Last cath 05/2001 : LM 20-30%, LAD 50-60% And 60-70% mid, LCX with mid AV portion 60-70%, RCA with patent stent with proximal 40-50% disease. LVEF by LVgram 65%, LVEDP 20. Overall moderate lesions, not best anatomically for PCI per notes.  - compliant with meds  - denies any specific chest pain  2. Hyperlipidemia  - compliant with crestor  - last panel 07/2013 TC 129 TG 130 HDL 49 LDL 54  3. HTN  - does not check regularly  - compliant with meds   4. Palpitations  - last visit increased coreg 37.5 mg bid, which seems to have improved symptoms.   5. COPD - noted on recent PFTs - has prn albuterol, followed by Dr Nadara Mustard - weaning cigarettes, 1 pack lasts 3 days  6. CKD - followed by pcp Past Medical History  Diagnosis Date  . CAD (coronary artery disease)     multivessel  . Two-vessel coronary artery disease     moderate. by cath in 2003. Status post stenting of the mdi RCA November 1999 normal left ventricular ejection fraction  . Dyslipidemia   . DM2 (diabetes mellitus, type 2)   . HTN (hypertension)   . COPD (chronic obstructive pulmonary disease)     with ongoing tobacco use and patient failed sham takes  . Morbid obesity   . 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 Chronic renal insufficiency   . Reflux esophagitis     hx  . 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      No Known Allergies   Current Outpatient Prescriptions  Medication Sig Dispense Refill  . albuterol (VENTOLIN HFA) 108 (90 BASE) MCG/ACT inhaler Inhale 2 puffs into the lungs every 6 (six) hours as needed.      Marland Kitchen amLODipine (NORVASC) 10 MG tablet Take 10 mg by mouth daily.        Marland Kitchen aspirin 81 MG tablet Take 81 mg by mouth daily.          . Canagliflozin (INVOKANA) 100 MG TABS Take 1 tablet by mouth daily.      . carvedilol (COREG) 25 MG tablet Take 1.5 tablets (37.5 mg total) by mouth 2 (two) times daily with a meal.  90 tablet  6  . clonazePAM (KLONOPIN) 0.5 MG tablet Take 0.5 mg by mouth daily.       . insulin glargine (LANTUS) 100 UNIT/ML injection Inject 30 Units into the skin daily.       . Liraglutide (VICTOZA) 18 MG/3ML SOLN Inject 1.82 mg into the skin daily.        . Multiple Vitamin (MULTIVITAMIN) tablet Take 1 tablet by mouth daily.        Marland Kitchen omeprazole (PRILOSEC) 20 MG capsule Take 20 mg by mouth daily.        . ramipril (ALTACE) 10 MG tablet Take 10 mg by mouth daily.        . rosuvastatin (CRESTOR) 10 MG tablet Take 10 mg by mouth daily.        . Tadalafil (CIALIS) 2.5 MG TABS Take 1 tablet by mouth daily.      Marland Kitchen zolpidem (AMBIEN) 10 MG tablet Take 10 mg by mouth at  bedtime as needed.       No current facility-administered medications for this visit.     No past surgical history on file.   No Known Allergies    Family History  Problem Relation Age of Onset  . Diabetes Mother   . Alcohol abuse Father      Social History Mr. Fickle reports that he has been smoking Cigarettes.  He started smoking about 45 years ago. He has a 40 pack-year smoking history. He has never used smokeless tobacco. Mr. Rittgers reports that he does not drink alcohol.   Review of Systems CONSTITUTIONAL: No weight loss, fever, chills, weakness or fatigue.  HEENT: Eyes: No visual loss, blurred vision, double vision or yellow sclerae.No hearing loss, sneezing, congestion, runny nose or sore throat.  SKIN: No rash or itching.  CARDIOVASCULAR: per HPI RESPIRATORY: No shortness of breath, cough or sputum.  GASTROINTESTINAL: constipation GENITOURINARY: No burning on urination, no polyuria NEUROLOGICAL: No headache, dizziness, syncope, paralysis, ataxia, numbness or tingling in the extremities. No change in bowel or bladder  control.  MUSCULOSKELETAL: No muscle, back pain, joint pain or stiffness.  LYMPHATICS: No enlarged nodes. No history of splenectomy.  PSYCHIATRIC: No history of depression or anxiety.  ENDOCRINOLOGIC: No reports of sweating, cold or heat intolerance. No polyuria or polydipsia.  Marland Kitchen   Physical Examination p 71 bp 150/75 Wt 252 lbs BMI 32 Gen: resting comfortably, no acute distress HEENT: no scleral icterus, pupils equal round and reactive, no palptable cervical adenopathy,  CV: RRR, no m/r/g, no JVD, no carotid bruits Resp: Clear to auscultation bilaterally GI: abdomen is soft, non-tender, non-distended, normal bowel sounds, no hepatosplenomegaly MSK: extremities are warm, 1+ bilateral edema Skin: warm, no rash Neuro:  no focal deficits Psych: appropriate affect   Diagnostic Studies 05/2001 Cath  FINDINGS:  1. Left main trunk: Medium caliber vessel. This is a long vessel with a  distal taper of 20-30%.  2. Left anterior descending artery: This is a medium caliber vessel that  provides a large bifurcating first diagonal Swayze Kozuch in the proximal segment  and before then extending to the apex, the LAD tapers significantly after  the diagonal Aloura Matsuoka. There is moderate disease of 50-60% after the  diagonal Danel Studzinski and then a focal eccentric narrowing of 60-70% in the mid  section of the LAD. The diagonal Sholom Dulude has moderate disease of 30-40% in  the proximal segment. The lateral division is a small caliber vessel with  an ostial narrowing of 60%.  3. Left circumflex artery: This is a small caliber vessel that provides a  trivial first marginal Leno Mathes in the mid section and a medium caliber  second marginal Sonu Kruckenberg distally. The mid AV circumflex at the takeoff of  the first and second diagonal branches had moderate diffuse disease of  60-70%.  4. Right coronary artery: Dominant. This is a large caliber vessel that  provides a posterior descending artery and two posteroventricular branches    in the terminal segment. The right coronary artery has evidence of an  existing stent in the mid section that is widely patent. Prior to stent is  a focal discrete narrowing of 40-50%. Distal to the stent is moderate  disease of 30%. The distal Aydrien Froman vessels also have mild disease of 30%.  5. Left ventricle: Normal end-systolic and end-diastolic dimensions. Overall  left ventricular function is well preserved. Ejection fraction is greater  than 65%. No mitral regurgitation. LV pressure is 180/10, aortic is  180/90. LV EDP equals  53.  ASSESSMENT AND PLAN: Mr. Eckstein is a 63 year old gentleman with moderate  three-vessel coronary artery disease. The patients plaque burden in the left  anterior descending artery and circumflex appears to have increased somewhat  compared to his previous angiogram. Unfortunately, the mid and distal left  anterior descending artery is a small caliber vessel with a long diffuse  segment of disease and would be associated with a high restenosis rate.  Similarly, the circumflex would suffer the same fate.  Further assessment with a stress imaging study would be prudent to isolate the  area of ischemia. Percutaneous intervention may then be targeted if  indicated. In addition, aggressive medical therapy should be pursued as the  patient currently is not on any anti-anginal therapy and has poorly controlled  hypertension. The patient will also be counseled regarding smoking cessation.    07/2013 PFTs Mild obstruction    Assessment and Plan  1. CAD  - no current chest pain - continue risk factor modification and secondary prevention   2. Hyperlipidemia  - continue crestor, lipids are at goal  3. HTN  - above goal, given his CKD and DM goal BP <130/80 - will start HCTZ 25mg  daily, check BMET in 2 weeks  4. Palpitations  - improved after increasing coreg, continue to follow clinicially  5. Tobacco  - mild COPD on PFTs, followed by pcp. Currently only  requriing rescue inhaler - still smoking, reports nicotine has not helped in the past, not interested in NRT - counseled on health benefits of cessation  6. CKD - per pcp, intensified bp control as described above   F/u 6 months    Arnoldo Lenis, M.D., F.A.C.C.

## 2013-09-23 NOTE — Patient Instructions (Signed)
   Begin HCTZ 25mg  daily - new sent to pharm Continue all other medications.   Lab for BMET - due in 2 weeks, around 10/07/2013 Office will contact with results via phone or letter.   Your physician has requested that you regularly monitor and record your blood pressure readings at home. Please take at varied times of the day over the next 2 weeks & return to office for MD review.   Your physician wants you to follow up in: 6 months.  You will receive a reminder letter in the mail one-two months in advance.  If you don't receive a letter, please call our office to schedule the follow up appointment

## 2013-09-27 DIAGNOSIS — E1149 Type 2 diabetes mellitus with other diabetic neurological complication: Secondary | ICD-10-CM | POA: Diagnosis not present

## 2013-09-27 DIAGNOSIS — E1129 Type 2 diabetes mellitus with other diabetic kidney complication: Secondary | ICD-10-CM | POA: Diagnosis not present

## 2013-09-27 DIAGNOSIS — F17200 Nicotine dependence, unspecified, uncomplicated: Secondary | ICD-10-CM | POA: Diagnosis not present

## 2013-09-27 DIAGNOSIS — Z6830 Body mass index (BMI) 30.0-30.9, adult: Secondary | ICD-10-CM | POA: Diagnosis not present

## 2013-09-27 DIAGNOSIS — N183 Chronic kidney disease, stage 3 unspecified: Secondary | ICD-10-CM | POA: Diagnosis not present

## 2013-09-27 DIAGNOSIS — E1142 Type 2 diabetes mellitus with diabetic polyneuropathy: Secondary | ICD-10-CM | POA: Diagnosis not present

## 2013-09-27 DIAGNOSIS — E669 Obesity, unspecified: Secondary | ICD-10-CM | POA: Diagnosis not present

## 2013-09-28 DIAGNOSIS — L97509 Non-pressure chronic ulcer of other part of unspecified foot with unspecified severity: Secondary | ICD-10-CM | POA: Diagnosis not present

## 2013-09-28 DIAGNOSIS — E1149 Type 2 diabetes mellitus with other diabetic neurological complication: Secondary | ICD-10-CM | POA: Diagnosis not present

## 2013-10-04 DIAGNOSIS — L821 Other seborrheic keratosis: Secondary | ICD-10-CM | POA: Diagnosis not present

## 2013-10-04 DIAGNOSIS — L851 Acquired keratosis [keratoderma] palmaris et plantaris: Secondary | ICD-10-CM | POA: Diagnosis not present

## 2013-10-04 DIAGNOSIS — D235 Other benign neoplasm of skin of trunk: Secondary | ICD-10-CM | POA: Diagnosis not present

## 2013-10-08 DIAGNOSIS — I10 Essential (primary) hypertension: Secondary | ICD-10-CM | POA: Diagnosis not present

## 2013-10-08 DIAGNOSIS — Z79899 Other long term (current) drug therapy: Secondary | ICD-10-CM | POA: Diagnosis not present

## 2013-10-19 DIAGNOSIS — K648 Other hemorrhoids: Secondary | ICD-10-CM | POA: Diagnosis not present

## 2013-10-19 DIAGNOSIS — D128 Benign neoplasm of rectum: Secondary | ICD-10-CM | POA: Diagnosis not present

## 2013-10-19 DIAGNOSIS — D126 Benign neoplasm of colon, unspecified: Secondary | ICD-10-CM | POA: Diagnosis not present

## 2013-10-19 DIAGNOSIS — Z86010 Personal history of colonic polyps: Secondary | ICD-10-CM | POA: Diagnosis not present

## 2013-10-19 DIAGNOSIS — Z09 Encounter for follow-up examination after completed treatment for conditions other than malignant neoplasm: Secondary | ICD-10-CM | POA: Diagnosis not present

## 2013-10-19 DIAGNOSIS — D175 Benign lipomatous neoplasm of intra-abdominal organs: Secondary | ICD-10-CM | POA: Diagnosis not present

## 2013-11-10 DIAGNOSIS — N181 Chronic kidney disease, stage 1: Secondary | ICD-10-CM | POA: Diagnosis not present

## 2013-11-10 DIAGNOSIS — F411 Generalized anxiety disorder: Secondary | ICD-10-CM | POA: Diagnosis not present

## 2013-11-10 DIAGNOSIS — E7800 Pure hypercholesterolemia, unspecified: Secondary | ICD-10-CM | POA: Diagnosis not present

## 2013-11-10 DIAGNOSIS — I10 Essential (primary) hypertension: Secondary | ICD-10-CM | POA: Diagnosis not present

## 2013-11-10 DIAGNOSIS — E119 Type 2 diabetes mellitus without complications: Secondary | ICD-10-CM | POA: Diagnosis not present

## 2013-11-10 DIAGNOSIS — F17200 Nicotine dependence, unspecified, uncomplicated: Secondary | ICD-10-CM | POA: Diagnosis not present

## 2013-12-13 ENCOUNTER — Telehealth: Payer: Self-pay | Admitting: *Deleted

## 2013-12-13 NOTE — Telephone Encounter (Signed)
Message copied by Merlene Laughter on Mon Dec 13, 2013  3:51 PM ------      Message from: Marquez F      Created: Fri Oct 08, 2013  3:53 PM       BP log on average looks ok, will continue current meds for now                  Zandra Abts MD ------

## 2013-12-13 NOTE — Telephone Encounter (Signed)
Patient informed via vm. 

## 2013-12-22 DIAGNOSIS — E119 Type 2 diabetes mellitus without complications: Secondary | ICD-10-CM | POA: Diagnosis not present

## 2013-12-28 ENCOUNTER — Encounter: Payer: Self-pay | Admitting: Cardiovascular Disease

## 2014-01-21 ENCOUNTER — Encounter: Payer: Self-pay | Admitting: Cardiology

## 2014-02-25 DIAGNOSIS — L97512 Non-pressure chronic ulcer of other part of right foot with fat layer exposed: Secondary | ICD-10-CM | POA: Diagnosis not present

## 2014-02-25 DIAGNOSIS — E1142 Type 2 diabetes mellitus with diabetic polyneuropathy: Secondary | ICD-10-CM | POA: Diagnosis not present

## 2014-03-08 DIAGNOSIS — E1142 Type 2 diabetes mellitus with diabetic polyneuropathy: Secondary | ICD-10-CM | POA: Diagnosis not present

## 2014-03-08 DIAGNOSIS — L97511 Non-pressure chronic ulcer of other part of right foot limited to breakdown of skin: Secondary | ICD-10-CM | POA: Diagnosis not present

## 2014-03-21 ENCOUNTER — Other Ambulatory Visit: Payer: Self-pay | Admitting: *Deleted

## 2014-03-21 MED ORDER — CARVEDILOL 25 MG PO TABS: 37.5000 mg | ORAL_TABLET | Freq: Two times a day (BID) | ORAL | Status: DC

## 2014-03-22 DIAGNOSIS — E1142 Type 2 diabetes mellitus with diabetic polyneuropathy: Secondary | ICD-10-CM | POA: Diagnosis not present

## 2014-03-22 DIAGNOSIS — L97511 Non-pressure chronic ulcer of other part of right foot limited to breakdown of skin: Secondary | ICD-10-CM | POA: Diagnosis not present

## 2014-03-31 ENCOUNTER — Encounter: Payer: Self-pay | Admitting: Cardiology

## 2014-03-31 ENCOUNTER — Ambulatory Visit (INDEPENDENT_AMBULATORY_CARE_PROVIDER_SITE_OTHER): Payer: Medicare Other | Admitting: Cardiology

## 2014-03-31 ENCOUNTER — Ambulatory Visit (INDEPENDENT_AMBULATORY_CARE_PROVIDER_SITE_OTHER): Payer: Medicare Other | Admitting: *Deleted

## 2014-03-31 VITALS — BP 124/81 | HR 75 | Ht 75.0 in | Wt 248.0 lb

## 2014-03-31 DIAGNOSIS — Z23 Encounter for immunization: Secondary | ICD-10-CM

## 2014-03-31 DIAGNOSIS — E785 Hyperlipidemia, unspecified: Secondary | ICD-10-CM | POA: Diagnosis not present

## 2014-03-31 DIAGNOSIS — I2510 Atherosclerotic heart disease of native coronary artery without angina pectoris: Secondary | ICD-10-CM

## 2014-03-31 DIAGNOSIS — I10 Essential (primary) hypertension: Secondary | ICD-10-CM

## 2014-03-31 MED ORDER — ROSUVASTATIN CALCIUM 20 MG PO TABS: 20.0000 mg | ORAL_TABLET | Freq: Every day | ORAL | Status: DC

## 2014-03-31 NOTE — Patient Instructions (Signed)
   Increase Crestor to 20mg  daily - new sent to pharm Continue all other medications.   Your physician wants you to follow up in: 6 months.  You will receive a reminder letter in the mail one-two months in advance.  If you don't receive a letter, please call our office to schedule the follow up appointment

## 2014-03-31 NOTE — Progress Notes (Signed)
Clinical Summary [name] is a 63 y.o.male seen today for follow up of the following medical problems.   1. CAD  - prior stent to RCA in 1999. Last cath 05/2001 : LM 20-30%, LAD 50-60% And 60-70% mid, LCX with mid AV portion 60-70%, RCA with patent stent with proximal 40-50% disease. LVEF by LVgram 65%, LVEDP 20. Overall moderate lesions, not great anatomically for PCI per notes, treated medically.   - since last visit he is doing well. Denies any chest pain, no SOB or DOE - he remains compliant with his meds   2. Hyperlipidemia  - compliant with crestor  - last lipid panel 12/2013 TC 115 TG 122 HDL 37 LDL 54  3. HTN  - does not check bp regularly  - compliant with meds    4. COPD - followed by pcp  5. CKD - followed by pcp    Past Medical History  Diagnosis Date  . CAD (coronary artery disease)     multivessel  . Two-vessel coronary artery disease     moderate. by cath in 2003. Status post stenting of the mdi RCA November 1999 normal left ventricular ejection fraction  . Dyslipidemia   . DM2 (diabetes mellitus, type 2)   . HTN (hypertension)   . COPD (chronic obstructive pulmonary disease)     with ongoing tobacco use and patient failed sham takes  . Morbid obesity   . 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 Chronic renal insufficiency   . Reflux esophagitis     hx  . 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      No Known Allergies   Current Outpatient Prescriptions  Medication Sig Dispense Refill  . albuterol (VENTOLIN HFA) 108 (90 BASE) MCG/ACT inhaler Inhale 2 puffs into the lungs every 6 (six) hours as needed.    Marland Kitchen amLODipine (NORVASC) 10 MG tablet Take 10 mg by mouth daily.      Marland Kitchen aspirin 81 MG tablet Take 81 mg by mouth daily.      . Canagliflozin (INVOKANA) 100 MG TABS Take 1 tablet by mouth daily.    . carvedilol (COREG) 25 MG  tablet Take 1.5 tablets (37.5 mg total) by mouth 2 (two) times daily with a meal. 90 tablet 6  . clonazePAM (KLONOPIN) 0.5 MG tablet Take 0.5 mg by mouth daily.     . hydrochlorothiazide (HYDRODIURIL) 25 MG tablet Take 1 tablet (25 mg total) by mouth daily. 25 tablet 6  . insulin glargine (LANTUS) 100 UNIT/ML injection Inject 30 Units into the skin daily.     . Liraglutide (VICTOZA) 18 MG/3ML SOLN Inject 1.82 mg into the skin daily.      . Multiple Vitamin (MULTIVITAMIN) tablet Take 1 tablet by mouth daily.      Marland Kitchen omeprazole (PRILOSEC) 20 MG capsule Take 20 mg by mouth daily.      . ramipril (ALTACE) 10 MG tablet Take 10 mg by mouth daily.      . rosuvastatin (CRESTOR) 10 MG tablet Take 10 mg by mouth daily.      . Tadalafil (CIALIS) 2.5 MG TABS Take 1 tablet by mouth daily.    Marland Kitchen zolpidem (AMBIEN) 10 MG tablet Take 10 mg by mouth at bedtime as needed.     No current facility-administered medications for this visit.     No past surgical  history on file.   No Known Allergies    Family History  Problem Relation Age of Onset  . Diabetes Mother   . Alcohol abuse Father      Social History [name] reports that he has been smoking Cigarettes.  He started smoking about 45 years ago. He has a 40 pack-year smoking history. He has never used smokeless tobacco. [name] reports that he does not drink alcohol.   Review of Systems CONSTITUTIONAL: No weight loss, fever, chills, weakness or fatigue.  HEENT: Eyes: No visual loss, blurred vision, double vision or yellow sclerae.No hearing loss, sneezing, congestion, runny nose or sore throat.  SKIN: No rash or itching.  CARDIOVASCULAR: per HPI RESPIRATORY: No shortness of breath, cough or sputum.  GASTROINTESTINAL: No anorexia, nausea, vomiting or diarrhea. No abdominal pain or blood.  GENITOURINARY: No burning on urination, no polyuria NEUROLOGICAL: No headache, dizziness, syncope, paralysis, ataxia, numbness or tingling in the  extremities. No change in bowel or bladder control.  MUSCULOSKELETAL: No muscle, back pain, joint pain or stiffness.  LYMPHATICS: No enlarged nodes. No history of splenectomy.  PSYCHIATRIC: No history of depression or anxiety.  ENDOCRINOLOGIC: No reports of sweating, cold or heat intolerance. No polyuria or polydipsia.  Marland Kitchen   Physical Examination p 75 bp 124/81 Wt 248 lbs BMI 31 Gen: resting comfortably, no acute distress HEENT: no scleral icterus, pupils equal round and reactive, no palptable cervical adenopathy,  CV: RRR, no m/r/g no JVD, no carotid bruits Resp: Clear to auscultation bilaterally GI: abdomen is soft, non-tender, non-distended, normal bowel sounds, no hepatosplenomegaly MSK: extremities are warm, no edema.  Skin: warm, no rash Neuro:  no focal deficits Psych: appropriate affect   Diagnostic Studies 05/2001 Cath  FINDINGS:  1. Left main trunk: Medium caliber vessel. This is a long vessel with a  distal taper of 20-30%.  2. Left anterior descending artery: This is a medium caliber vessel that  provides a large bifurcating first diagonal [name] in the proximal segment  and before then extending to the apex, the LAD tapers significantly after  the diagonal [name]. There is moderate disease of 50-60% after the  diagonal [name] and then a focal eccentric narrowing of 60-70% in the mid  section of the LAD. The diagonal [name] has moderate disease of 30-40% in  the proximal segment. The lateral division is a small caliber vessel with  an ostial narrowing of 60%.  3. Left circumflex artery: This is a small caliber vessel that provides a  trivial first marginal [name] in the mid section and a medium caliber  second marginal [name] distally. The mid AV circumflex at the takeoff of  the first and second diagonal branches had moderate diffuse disease of  60-70%.  4. Right coronary artery: Dominant. This is a large caliber vessel that  provides a posterior  descending artery and two posteroventricular branches  in the terminal segment. The right coronary artery has evidence of an  existing stent in the mid section that is widely patent. Prior to stent is  a focal discrete narrowing of 40-50%. Distal to the stent is moderate  disease of 30%. The distal Zayah Keilman vessels also have mild disease of 30%.  5. Left ventricle: Normal end-systolic and end-diastolic dimensions. Overall  left ventricular function is well preserved. Ejection fraction is greater  than 65%. No mitral regurgitation. LV pressure is 180/10, aortic is  180/90. LV EDP equals 20.  ASSESSMENT AND PLAN: Mr. Steagall is a 63 year old gentleman with moderate  three-vessel coronary artery disease. The patients plaque burden in the left  anterior descending artery and circumflex appears to have increased somewhat  compared to his previous angiogram. Unfortunately, the mid and distal left  anterior descending artery is a small caliber vessel with a long diffuse  segment of disease and would be associated with a high restenosis rate.  Similarly, the circumflex would suffer the same fate.  Further assessment with a stress imaging study would be prudent to isolate the  area of ischemia. Percutaneous intervention may then be targeted if  indicated. In addition, aggressive medical therapy should be pursued as the  patient currently is not on any anti-anginal therapy and has poorly controlled  hypertension. The patient will also be counseled regarding smoking cessation.    07/2013 PFTs Mild obstruction    Assessment and Plan   1. CAD  - no current symptoms - continue risk factor modification and secondary prevention   2. Hyperlipidemia  - continue crestor, lipids are at goal  3. HTN  - at goal, continue current meds    F/u 6 months   Arnoldo Lenis, M.D.,

## 2014-04-04 DIAGNOSIS — E1142 Type 2 diabetes mellitus with diabetic polyneuropathy: Secondary | ICD-10-CM | POA: Diagnosis not present

## 2014-04-04 DIAGNOSIS — N183 Chronic kidney disease, stage 3 (moderate): Secondary | ICD-10-CM | POA: Diagnosis not present

## 2014-04-04 DIAGNOSIS — E1122 Type 2 diabetes mellitus with diabetic chronic kidney disease: Secondary | ICD-10-CM | POA: Diagnosis not present

## 2014-04-04 DIAGNOSIS — N529 Male erectile dysfunction, unspecified: Secondary | ICD-10-CM | POA: Diagnosis not present

## 2014-04-04 DIAGNOSIS — R6882 Decreased libido: Secondary | ICD-10-CM | POA: Diagnosis not present

## 2014-04-05 DIAGNOSIS — L97511 Non-pressure chronic ulcer of other part of right foot limited to breakdown of skin: Secondary | ICD-10-CM | POA: Diagnosis not present

## 2014-04-06 DIAGNOSIS — R6882 Decreased libido: Secondary | ICD-10-CM | POA: Diagnosis not present

## 2014-04-12 DIAGNOSIS — L97511 Non-pressure chronic ulcer of other part of right foot limited to breakdown of skin: Secondary | ICD-10-CM | POA: Diagnosis not present

## 2014-04-12 DIAGNOSIS — E1142 Type 2 diabetes mellitus with diabetic polyneuropathy: Secondary | ICD-10-CM | POA: Diagnosis not present

## 2014-04-20 DIAGNOSIS — R5382 Chronic fatigue, unspecified: Secondary | ICD-10-CM | POA: Diagnosis not present

## 2014-04-20 DIAGNOSIS — N400 Enlarged prostate without lower urinary tract symptoms: Secondary | ICD-10-CM | POA: Diagnosis not present

## 2014-04-20 DIAGNOSIS — M129 Arthropathy, unspecified: Secondary | ICD-10-CM | POA: Diagnosis not present

## 2014-04-20 DIAGNOSIS — E119 Type 2 diabetes mellitus without complications: Secondary | ICD-10-CM | POA: Diagnosis not present

## 2014-04-20 DIAGNOSIS — Z720 Tobacco use: Secondary | ICD-10-CM | POA: Diagnosis not present

## 2014-04-20 DIAGNOSIS — E780 Pure hypercholesterolemia: Secondary | ICD-10-CM | POA: Diagnosis not present

## 2014-04-20 DIAGNOSIS — D519 Vitamin B12 deficiency anemia, unspecified: Secondary | ICD-10-CM | POA: Diagnosis not present

## 2014-04-20 DIAGNOSIS — E663 Overweight: Secondary | ICD-10-CM | POA: Diagnosis not present

## 2014-04-20 DIAGNOSIS — E559 Vitamin D deficiency, unspecified: Secondary | ICD-10-CM | POA: Diagnosis not present

## 2014-04-26 DIAGNOSIS — L97509 Non-pressure chronic ulcer of other part of unspecified foot with unspecified severity: Secondary | ICD-10-CM | POA: Diagnosis not present

## 2014-04-26 DIAGNOSIS — E1342 Other specified diabetes mellitus with diabetic polyneuropathy: Secondary | ICD-10-CM | POA: Diagnosis not present

## 2014-04-27 DIAGNOSIS — J438 Other emphysema: Secondary | ICD-10-CM | POA: Diagnosis not present

## 2014-04-27 DIAGNOSIS — Z1389 Encounter for screening for other disorder: Secondary | ICD-10-CM | POA: Diagnosis not present

## 2014-04-27 DIAGNOSIS — N189 Chronic kidney disease, unspecified: Secondary | ICD-10-CM | POA: Diagnosis not present

## 2014-04-27 DIAGNOSIS — K219 Gastro-esophageal reflux disease without esophagitis: Secondary | ICD-10-CM | POA: Diagnosis not present

## 2014-04-27 DIAGNOSIS — I10 Essential (primary) hypertension: Secondary | ICD-10-CM | POA: Diagnosis not present

## 2014-04-27 DIAGNOSIS — E780 Pure hypercholesterolemia: Secondary | ICD-10-CM | POA: Diagnosis not present

## 2014-04-27 DIAGNOSIS — Z720 Tobacco use: Secondary | ICD-10-CM | POA: Diagnosis not present

## 2014-05-31 DIAGNOSIS — L97509 Non-pressure chronic ulcer of other part of unspecified foot with unspecified severity: Secondary | ICD-10-CM | POA: Diagnosis not present

## 2014-05-31 DIAGNOSIS — E1342 Other specified diabetes mellitus with diabetic polyneuropathy: Secondary | ICD-10-CM | POA: Diagnosis not present

## 2014-06-15 DIAGNOSIS — Z716 Tobacco abuse counseling: Secondary | ICD-10-CM | POA: Diagnosis not present

## 2014-06-15 DIAGNOSIS — E1129 Type 2 diabetes mellitus with other diabetic kidney complication: Secondary | ICD-10-CM | POA: Diagnosis not present

## 2014-06-15 DIAGNOSIS — N184 Chronic kidney disease, stage 4 (severe): Secondary | ICD-10-CM | POA: Diagnosis not present

## 2014-06-15 DIAGNOSIS — N183 Chronic kidney disease, stage 3 (moderate): Secondary | ICD-10-CM | POA: Diagnosis not present

## 2014-06-15 DIAGNOSIS — E669 Obesity, unspecified: Secondary | ICD-10-CM | POA: Diagnosis not present

## 2014-06-15 DIAGNOSIS — E559 Vitamin D deficiency, unspecified: Secondary | ICD-10-CM | POA: Diagnosis not present

## 2014-06-15 DIAGNOSIS — I10 Essential (primary) hypertension: Secondary | ICD-10-CM | POA: Diagnosis not present

## 2014-06-15 DIAGNOSIS — R809 Proteinuria, unspecified: Secondary | ICD-10-CM | POA: Diagnosis not present

## 2014-06-24 ENCOUNTER — Other Ambulatory Visit (HOSPITAL_COMMUNITY): Payer: Self-pay | Admitting: Nephrology

## 2014-06-24 DIAGNOSIS — N183 Chronic kidney disease, stage 3 unspecified: Secondary | ICD-10-CM

## 2014-07-13 ENCOUNTER — Ambulatory Visit (HOSPITAL_COMMUNITY)
Admission: RE | Admit: 2014-07-13 | Discharge: 2014-07-13 | Disposition: A | Discharge status: Home / Self Care | Payer: Medicare Other | Source: Ambulatory Visit | Attending: Nephrology | Admitting: Nephrology

## 2014-07-13 DIAGNOSIS — D509 Iron deficiency anemia, unspecified: Secondary | ICD-10-CM | POA: Diagnosis not present

## 2014-07-13 DIAGNOSIS — N183 Chronic kidney disease, stage 3 unspecified: Secondary | ICD-10-CM

## 2014-07-13 DIAGNOSIS — Z79899 Other long term (current) drug therapy: Secondary | ICD-10-CM | POA: Diagnosis not present

## 2014-07-13 DIAGNOSIS — E559 Vitamin D deficiency, unspecified: Secondary | ICD-10-CM | POA: Diagnosis not present

## 2014-07-13 DIAGNOSIS — R809 Proteinuria, unspecified: Secondary | ICD-10-CM | POA: Diagnosis not present

## 2014-07-13 DIAGNOSIS — I10 Essential (primary) hypertension: Secondary | ICD-10-CM | POA: Diagnosis not present

## 2014-07-27 DIAGNOSIS — N183 Chronic kidney disease, stage 3 (moderate): Secondary | ICD-10-CM | POA: Diagnosis not present

## 2014-07-27 DIAGNOSIS — N184 Chronic kidney disease, stage 4 (severe): Secondary | ICD-10-CM | POA: Diagnosis not present

## 2014-07-27 DIAGNOSIS — R809 Proteinuria, unspecified: Secondary | ICD-10-CM | POA: Diagnosis not present

## 2014-07-27 DIAGNOSIS — E1129 Type 2 diabetes mellitus with other diabetic kidney complication: Secondary | ICD-10-CM | POA: Diagnosis not present

## 2014-07-27 DIAGNOSIS — I10 Essential (primary) hypertension: Secondary | ICD-10-CM | POA: Diagnosis not present

## 2014-07-27 DIAGNOSIS — Z716 Tobacco abuse counseling: Secondary | ICD-10-CM | POA: Diagnosis not present

## 2014-07-27 DIAGNOSIS — E669 Obesity, unspecified: Secondary | ICD-10-CM | POA: Diagnosis not present

## 2014-07-27 DIAGNOSIS — E559 Vitamin D deficiency, unspecified: Secondary | ICD-10-CM | POA: Diagnosis not present

## 2014-08-23 DIAGNOSIS — L97511 Non-pressure chronic ulcer of other part of right foot limited to breakdown of skin: Secondary | ICD-10-CM | POA: Diagnosis not present

## 2014-08-23 DIAGNOSIS — E1342 Other specified diabetes mellitus with diabetic polyneuropathy: Secondary | ICD-10-CM | POA: Diagnosis not present

## 2014-09-06 DIAGNOSIS — E1342 Other specified diabetes mellitus with diabetic polyneuropathy: Secondary | ICD-10-CM | POA: Diagnosis not present

## 2014-09-06 DIAGNOSIS — L97511 Non-pressure chronic ulcer of other part of right foot limited to breakdown of skin: Secondary | ICD-10-CM | POA: Diagnosis not present

## 2014-09-20 DIAGNOSIS — L97511 Non-pressure chronic ulcer of other part of right foot limited to breakdown of skin: Secondary | ICD-10-CM | POA: Diagnosis not present

## 2014-09-20 DIAGNOSIS — E1342 Other specified diabetes mellitus with diabetic polyneuropathy: Secondary | ICD-10-CM | POA: Diagnosis not present

## 2014-09-26 ENCOUNTER — Encounter (HOSPITAL_COMMUNITY): Payer: Self-pay | Admitting: *Deleted

## 2014-09-26 ENCOUNTER — Inpatient Hospital Stay (HOSPITAL_COMMUNITY)
Admission: EM | Admit: 2014-09-26 | Discharge: 2014-09-28 | DRG: 639 | Disposition: A | Discharge status: Home / Self Care | Payer: Medicare Other | Attending: Internal Medicine | Admitting: Internal Medicine

## 2014-09-26 ENCOUNTER — Emergency Department (HOSPITAL_COMMUNITY): Payer: Medicare Other

## 2014-09-26 DIAGNOSIS — N179 Acute kidney failure, unspecified: Secondary | ICD-10-CM | POA: Diagnosis not present

## 2014-09-26 DIAGNOSIS — E876 Hypokalemia: Secondary | ICD-10-CM | POA: Diagnosis present

## 2014-09-26 DIAGNOSIS — Z794 Long term (current) use of insulin: Secondary | ICD-10-CM

## 2014-09-26 DIAGNOSIS — Z89422 Acquired absence of other left toe(s): Secondary | ICD-10-CM

## 2014-09-26 DIAGNOSIS — L089 Local infection of the skin and subcutaneous tissue, unspecified: Secondary | ICD-10-CM | POA: Diagnosis present

## 2014-09-26 DIAGNOSIS — E11621 Type 2 diabetes mellitus with foot ulcer: Secondary | ICD-10-CM | POA: Diagnosis present

## 2014-09-26 DIAGNOSIS — L97519 Non-pressure chronic ulcer of other part of right foot with unspecified severity: Secondary | ICD-10-CM | POA: Diagnosis not present

## 2014-09-26 DIAGNOSIS — R251 Tremor, unspecified: Secondary | ICD-10-CM | POA: Diagnosis not present

## 2014-09-26 DIAGNOSIS — M19071 Primary osteoarthritis, right ankle and foot: Secondary | ICD-10-CM | POA: Diagnosis not present

## 2014-09-26 DIAGNOSIS — F17210 Nicotine dependence, cigarettes, uncomplicated: Secondary | ICD-10-CM | POA: Diagnosis present

## 2014-09-26 DIAGNOSIS — N183 Chronic kidney disease, stage 3 unspecified: Secondary | ICD-10-CM | POA: Insufficient documentation

## 2014-09-26 DIAGNOSIS — Z833 Family history of diabetes mellitus: Secondary | ICD-10-CM

## 2014-09-26 DIAGNOSIS — I2510 Atherosclerotic heart disease of native coronary artery without angina pectoris: Secondary | ICD-10-CM | POA: Diagnosis present

## 2014-09-26 DIAGNOSIS — E1165 Type 2 diabetes mellitus with hyperglycemia: Secondary | ICD-10-CM | POA: Diagnosis present

## 2014-09-26 DIAGNOSIS — E1169 Type 2 diabetes mellitus with other specified complication: Secondary | ICD-10-CM | POA: Diagnosis not present

## 2014-09-26 DIAGNOSIS — Z66 Do not resuscitate: Secondary | ICD-10-CM | POA: Diagnosis present

## 2014-09-26 DIAGNOSIS — I6789 Other cerebrovascular disease: Secondary | ICD-10-CM | POA: Diagnosis not present

## 2014-09-26 DIAGNOSIS — Z7982 Long term (current) use of aspirin: Secondary | ICD-10-CM

## 2014-09-26 DIAGNOSIS — IMO0002 Reserved for concepts with insufficient information to code with codable children: Secondary | ICD-10-CM | POA: Insufficient documentation

## 2014-09-26 DIAGNOSIS — E785 Hyperlipidemia, unspecified: Secondary | ICD-10-CM | POA: Diagnosis present

## 2014-09-26 DIAGNOSIS — Z89421 Acquired absence of other right toe(s): Secondary | ICD-10-CM | POA: Diagnosis not present

## 2014-09-26 DIAGNOSIS — J449 Chronic obstructive pulmonary disease, unspecified: Secondary | ICD-10-CM | POA: Diagnosis present

## 2014-09-26 DIAGNOSIS — F419 Anxiety disorder, unspecified: Secondary | ICD-10-CM | POA: Diagnosis present

## 2014-09-26 DIAGNOSIS — E1121 Type 2 diabetes mellitus with diabetic nephropathy: Secondary | ICD-10-CM | POA: Diagnosis not present

## 2014-09-26 DIAGNOSIS — I10 Essential (primary) hypertension: Secondary | ICD-10-CM | POA: Insufficient documentation

## 2014-09-26 DIAGNOSIS — I129 Hypertensive chronic kidney disease with stage 1 through stage 4 chronic kidney disease, or unspecified chronic kidney disease: Secondary | ICD-10-CM | POA: Diagnosis present

## 2014-09-26 DIAGNOSIS — E1122 Type 2 diabetes mellitus with diabetic chronic kidney disease: Secondary | ICD-10-CM | POA: Insufficient documentation

## 2014-09-26 DIAGNOSIS — E1129 Type 2 diabetes mellitus with other diabetic kidney complication: Secondary | ICD-10-CM | POA: Insufficient documentation

## 2014-09-26 DIAGNOSIS — Z811 Family history of alcohol abuse and dependence: Secondary | ICD-10-CM

## 2014-09-26 DIAGNOSIS — R509 Fever, unspecified: Secondary | ICD-10-CM | POA: Diagnosis not present

## 2014-09-26 DIAGNOSIS — E11628 Type 2 diabetes mellitus with other skin complications: Principal | ICD-10-CM | POA: Diagnosis present

## 2014-09-26 LAB — COMPREHENSIVE METABOLIC PANEL
ALT: 12 U/L — ABNORMAL LOW (ref 17–63)
AST: 18 U/L (ref 15–41)
Albumin: 3.7 g/dL (ref 3.5–5.0)
Alkaline Phosphatase: 51 U/L (ref 38–126)
Anion gap: 10 (ref 5–15)
BUN: 38 mg/dL — ABNORMAL HIGH (ref 6–20)
CO2: 27 mmol/L (ref 22–32)
Calcium: 8.8 mg/dL — ABNORMAL LOW (ref 8.9–10.3)
Chloride: 97 mmol/L — ABNORMAL LOW (ref 101–111)
Creatinine, Ser: 2.26 mg/dL — ABNORMAL HIGH (ref 0.61–1.24)
GFR calc Af Amer: 34 mL/min — ABNORMAL LOW (ref >60)
GFR calc non Af Amer: 30 mL/min — ABNORMAL LOW (ref >60)
Glucose, Bld: 102 mg/dL — ABNORMAL HIGH (ref 70–99)
Potassium: 2.8 mmol/L — ABNORMAL LOW (ref 3.5–5.1)
Sodium: 134 mmol/L — ABNORMAL LOW (ref 135–145)
Total Bilirubin: 0.9 mg/dL (ref 0.3–1.2)
Total Protein: 7.4 g/dL (ref 6.5–8.1)

## 2014-09-26 LAB — CBC WITH DIFFERENTIAL/PLATELET
Basophils Absolute: 0 10*3/uL (ref 0.0–0.1)
Basophils Relative: 0 % (ref 0–1)
Eosinophils Absolute: 0 10*3/uL (ref 0.0–0.7)
Eosinophils Relative: 0 % (ref 0–5)
HCT: 47.3 % (ref 39.0–52.0)
Hemoglobin: 16.4 g/dL (ref 13.0–17.0)
Lymphocytes Relative: 8 % — ABNORMAL LOW (ref 12–46)
Lymphs Abs: 0.8 10*3/uL (ref 0.7–4.0)
MCH: 32.4 pg (ref 26.0–34.0)
MCHC: 34.7 g/dL (ref 30.0–36.0)
MCV: 93.5 fL (ref 78.0–100.0)
Monocytes Absolute: 0.9 10*3/uL (ref 0.1–1.0)
Monocytes Relative: 9 % (ref 3–12)
Neutro Abs: 8.2 10*3/uL — ABNORMAL HIGH (ref 1.7–7.7)
Neutrophils Relative %: 83 % — ABNORMAL HIGH (ref 43–77)
Platelets: 170 10*3/uL (ref 150–400)
RBC: 5.06 MIL/uL (ref 4.22–5.81)
RDW: 14.9 % (ref 11.5–15.5)
WBC: 10 10*3/uL (ref 4.0–10.5)

## 2014-09-26 LAB — I-STAT CG4 LACTIC ACID, ED: Lactic Acid, Venous: 1.91 mmol/L (ref 0.5–2.0)

## 2014-09-26 MED ORDER — VANCOMYCIN HCL IN DEXTROSE 1-5 GM/200ML-% IV SOLN
1000.0000 mg | Freq: Once | INTRAVENOUS | Status: AC
Start: 2014-09-26 — End: 2014-09-27
  Administered 2014-09-26: 1000 mg via INTRAVENOUS
  Filled 2014-09-26: qty 200

## 2014-09-26 NOTE — ED Notes (Signed)
Pt brought in by [facility] for c/o chills that started x 1 hour ago; pt has vomited x one with [facility]

## 2014-09-26 NOTE — ED Provider Notes (Signed)
CSN: [id number]     Arrival date & time 09/26/14  1857 History   This chart was scribed for Davonna Belling, MD by Tula Nakayama, ED Scribe. This patient was seen in room APA12/APA12 and the patient's care was started at 10:25 PM.    Chief Complaint  Patient presents with  . Chills   The history is provided by the patient and a relative. No language interpreter was used.   HPI Comments: [name] is a 64 y.o. male with a history of DM,  who presents to the Emergency Department complaining of sudden onset, moderate chills that started earlier this evening. Pt states generalized body aches, 1 episode of diarrhea and cough as associated symptoms. He also notes an ulcer on his right foot that has been present for several weeks. Pt has a history of recurrent sepsis and notes that current symptoms are consistent with 3 prior episodes. He reports that the first occurrence of sepsis was caused by a tick bite that had occurred several years previously and was treated with Vancomycin. His previous infections were treated by [facility]. Pt reports blood sugar has measured normally in the last few days. He smokes cigarettes. Pt denies sore throat, nausea and vomiting as associated symptoms.  Past Medical History  Diagnosis Date  . CAD (coronary artery disease)     multivessel  . Two-vessel coronary artery disease     moderate. by cath in 2003. Status post stenting of the mdi RCA November 1999 normal left ventricular ejection fraction  . Dyslipidemia   . DM2 (diabetes mellitus, type 2)   . HTN (hypertension)   . COPD (chronic obstructive pulmonary disease)     with ongoing tobacco use and patient failed sham takes  . Morbid obesity   . 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 Chronic renal insufficiency   . Reflux esophagitis     hx  . 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     Past Surgical History  Procedure Laterality Date  . Back surgery    . Abdominal surgery    . Diabetic ulcers     Family History  Problem Relation Age of Onset  . Diabetes Mother   . Alcohol abuse Father    History  Substance Use Topics  . Smoking status: Current Every Day Smoker -- 1.00 packs/day for 40 years    Types: Cigarettes    Start date: 05/20/1968  . Smokeless tobacco: Never Used  . Alcohol Use: No    Review of Systems  Constitutional: Positive for fever and chills.  HENT: Negative for sore throat.   Respiratory: Positive for cough.   Gastrointestinal: Positive for diarrhea. Negative for nausea and vomiting.  Musculoskeletal: Positive for myalgias.  Skin: Positive for wound.  All other systems reviewed and are negative.   Allergies  Review of patient's allergies indicates no known allergies.  Home Medications   Prior to Admission medications   Medication Sig Start Date End Date Taking? Authorizing Provider  albuterol (VENTOLIN HFA) 108 (90 BASE) MCG/ACT inhaler Inhale 2 puffs into the lungs every 6 (six) hours as needed.   Yes Historical Provider, MD  amLODipine (NORVASC) 10 MG tablet Take 10 mg by mouth daily.     Yes Historical Provider, MD  aspirin 81 MG tablet Take 81 mg by mouth daily.     Yes Historical Provider, MD  Canagliflozin (INVOKANA) 100 MG TABS Take 1 tablet by mouth daily.   Yes Historical Provider, MD  carvedilol (COREG) 25 MG tablet Take 1.5 tablets (37.5 mg total) by mouth 2 (two) times daily with a meal. Patient taking differently: Take 25-50 mg by mouth 2 (two) times daily. Two tablets in the morning and one tablet at bedtime 03/21/14  Yes Arnoldo Lenis, MD  cholecalciferol (VITAMIN D) 1000 UNITS tablet Take 1,000 Units by mouth daily.   Yes Historical Provider, MD  clonazePAM (KLONOPIN) 0.5 MG tablet Take 0.5 mg by mouth daily.    Yes Historical Provider, MD  hydrochlorothiazide (HYDRODIURIL) 25 MG tablet Take 1 tablet (25 mg total) by  mouth daily. 09/23/13  Yes Arnoldo Lenis, MD  insulin glargine (LANTUS) 100 UNIT/ML injection Inject 20 Units into the skin every morning.    Yes Historical Provider, MD  Liraglutide (VICTOZA) 18 MG/3ML SOLN Inject 1.8 mg into the skin at bedtime.    Yes Historical Provider, MD  meclizine (ANTIVERT) 25 MG tablet Take 25 mg by mouth 3 (three) times daily as needed for dizziness.   Yes Historical Provider, MD  Multiple Vitamin (MULTIVITAMIN) tablet Take 1 tablet by mouth daily.     Yes Historical Provider, MD  omeprazole (PRILOSEC) 20 MG capsule Take 20 mg by mouth daily.     Yes Historical Provider, MD  ramipril (ALTACE) 10 MG tablet Take 10 mg by mouth daily.     Yes Historical Provider, MD  rosuvastatin (CRESTOR) 20 MG tablet Take 1 tablet (20 mg total) by mouth daily. 03/31/14  Yes Arnoldo Lenis, MD  zolpidem (AMBIEN) 10 MG tablet Take 10 mg by mouth at bedtime as needed.   Yes Historical Provider, MD   BP 129/54 mmHg  Pulse 86  Temp(Src) 99.6 F (37.6 C) (Oral)  Resp 18  Ht 6\' 4"  (1.93 m)  Wt 243 lb (110.224 kg)  BMI 29.59 kg/m2  SpO2 90% Physical Exam  Constitutional: He appears well-developed and well-nourished. No distress.  Flushed, warm, red  HENT:  Head: Normocephalic and atraumatic.  Eyes: Conjunctivae and EOM are normal.  Neck: Neck supple. No tracheal deviation present.  Cardiovascular: Normal rate.   Pulmonary/Chest: Effort normal. No respiratory distress. He has wheezes.  Lung with diffuse wheezes, not localized to specific spot  Skin: Skin is warm and dry.  Foot warm; ulcer on the right with purulent drainage that expresses with squeezing; 2nd toe missing distal part, but toes overall normal  Psychiatric: He has a normal mood and affect. His behavior is normal.  Nursing note and vitals reviewed.   ED Course  Procedures   DIAGNOSTIC STUDIES: Oxygen Saturation is 90% on RA, normal by my interpretation.    COORDINATION OF CARE: 10:35 PM Discussed treatment  plan with pt which includes lab work, an x-ray of his right foot and a chest x-ray. Pt agreed to plan.   Labs Review Labs Reviewed  COMPREHENSIVE METABOLIC PANEL - Abnormal; Notable for the following:    Sodium 134 (*)    Potassium 2.8 (*)    Chloride 97 (*)    Glucose, Bld 102 (*)    BUN 38 (*)    Creatinine, Ser 2.26 (*)    Calcium 8.8 (*)    ALT 12 (*)    GFR calc non Af Amer 30 (*)    GFR calc Af Amer 34 (*)    All other components within normal limits  CBC WITH DIFFERENTIAL/PLATELET - Abnormal; Notable for  the following:    Neutrophils Relative % 83 (*)    Neutro Abs 8.2 (*)    Lymphocytes Relative 8 (*)    All other components within normal limits  CULTURE, BLOOD (ROUTINE X 2)  CULTURE, BLOOD (ROUTINE X 2)  WOUND CULTURE  URINALYSIS, ROUTINE W REFLEX MICROSCOPIC  I-STAT CG4 LACTIC ACID, ED    Imaging Review Dg Chest 2 View  09/26/2014   CLINICAL DATA:  Diabetic ulcer on the right foot. Fever today. Generalized weakness. Smoker.  EXAM: CHEST  2 VIEW  COMPARISON:  11/04/2012.  11/03/2012.  FINDINGS: Emphysematous changes in the lungs. Normal heart size and pulmonary vascularity. No focal airspace disease or consolidation in the lungs. No blunting of costophrenic angles. No pneumothorax. Mediastinal contours appear intact. Old bilateral rib fractures. Calcified and tortuous aorta.  IMPRESSION: Emphysematous changes in the lungs. No evidence of active pulmonary disease.   Electronically Signed   By: Lucienne Capers M.D.   On: 09/26/2014 23:46   Dg Foot 2 Views Right  09/26/2014   CLINICAL DATA:  Diabetic ulcer on the bottom of the right foot at the third through fifth MTP joints for several weeks. Fever.  EXAM: RIGHT FOOT - 2 VIEW  COMPARISON:  None.  FINDINGS: Previous amputation of the left second toe at the level of the proximal interphalangeal joint. Degenerative changes in the interphalangeal and intertarsal joints. No evidence of focal bone erosion to suggest osteomyelitis.  No evidence of acute fracture or dislocation.  IMPRESSION: Previous amputation of the left second toe. Degenerative changes. No acute bony abnormalities.   Electronically Signed   By: Lucienne Capers M.D.   On: 09/26/2014 23:47     EKG Interpretation None      MDM   Final diagnoses:  Diabetic foot infection    Patient with fevers. States he's had this before and was told it was from a tick bite. Review of records shows he has had osteomyelitis in his feet. Has a draining wound on his foot. Culture sent. Will admit to internal medicine.  I personally performed the services described in this documentation, which was scribed in my presence. The recorded information has been reviewed and is accurate.     Davonna Belling, MD 09/27/14 [phone]

## 2014-09-27 ENCOUNTER — Encounter (HOSPITAL_COMMUNITY): Payer: Self-pay | Admitting: *Deleted

## 2014-09-27 DIAGNOSIS — IMO0002 Reserved for concepts with insufficient information to code with codable children: Secondary | ICD-10-CM | POA: Insufficient documentation

## 2014-09-27 DIAGNOSIS — E1122 Type 2 diabetes mellitus with diabetic chronic kidney disease: Secondary | ICD-10-CM | POA: Insufficient documentation

## 2014-09-27 DIAGNOSIS — I2510 Atherosclerotic heart disease of native coronary artery without angina pectoris: Secondary | ICD-10-CM

## 2014-09-27 DIAGNOSIS — L089 Local infection of the skin and subcutaneous tissue, unspecified: Secondary | ICD-10-CM | POA: Diagnosis not present

## 2014-09-27 DIAGNOSIS — Z66 Do not resuscitate: Secondary | ICD-10-CM | POA: Diagnosis present

## 2014-09-27 DIAGNOSIS — E11628 Type 2 diabetes mellitus with other skin complications: Secondary | ICD-10-CM | POA: Diagnosis present

## 2014-09-27 DIAGNOSIS — Z811 Family history of alcohol abuse and dependence: Secondary | ICD-10-CM | POA: Diagnosis not present

## 2014-09-27 DIAGNOSIS — E1129 Type 2 diabetes mellitus with other diabetic kidney complication: Secondary | ICD-10-CM

## 2014-09-27 DIAGNOSIS — I129 Hypertensive chronic kidney disease with stage 1 through stage 4 chronic kidney disease, or unspecified chronic kidney disease: Secondary | ICD-10-CM | POA: Diagnosis present

## 2014-09-27 DIAGNOSIS — E785 Hyperlipidemia, unspecified: Secondary | ICD-10-CM | POA: Diagnosis present

## 2014-09-27 DIAGNOSIS — Z7982 Long term (current) use of aspirin: Secondary | ICD-10-CM | POA: Diagnosis not present

## 2014-09-27 DIAGNOSIS — I10 Essential (primary) hypertension: Secondary | ICD-10-CM | POA: Insufficient documentation

## 2014-09-27 DIAGNOSIS — Z794 Long term (current) use of insulin: Secondary | ICD-10-CM | POA: Diagnosis not present

## 2014-09-27 DIAGNOSIS — N179 Acute kidney failure, unspecified: Secondary | ICD-10-CM | POA: Diagnosis present

## 2014-09-27 DIAGNOSIS — E876 Hypokalemia: Secondary | ICD-10-CM | POA: Diagnosis present

## 2014-09-27 DIAGNOSIS — E1165 Type 2 diabetes mellitus with hyperglycemia: Secondary | ICD-10-CM | POA: Diagnosis present

## 2014-09-27 DIAGNOSIS — J449 Chronic obstructive pulmonary disease, unspecified: Secondary | ICD-10-CM | POA: Diagnosis present

## 2014-09-27 DIAGNOSIS — L97519 Non-pressure chronic ulcer of other part of right foot with unspecified severity: Secondary | ICD-10-CM | POA: Diagnosis present

## 2014-09-27 DIAGNOSIS — N183 Chronic kidney disease, stage 3 unspecified: Secondary | ICD-10-CM | POA: Insufficient documentation

## 2014-09-27 DIAGNOSIS — Z89422 Acquired absence of other left toe(s): Secondary | ICD-10-CM | POA: Diagnosis not present

## 2014-09-27 DIAGNOSIS — E1121 Type 2 diabetes mellitus with diabetic nephropathy: Secondary | ICD-10-CM | POA: Diagnosis present

## 2014-09-27 DIAGNOSIS — E1169 Type 2 diabetes mellitus with other specified complication: Secondary | ICD-10-CM

## 2014-09-27 DIAGNOSIS — Z833 Family history of diabetes mellitus: Secondary | ICD-10-CM | POA: Diagnosis not present

## 2014-09-27 DIAGNOSIS — F17210 Nicotine dependence, cigarettes, uncomplicated: Secondary | ICD-10-CM | POA: Diagnosis present

## 2014-09-27 DIAGNOSIS — E11621 Type 2 diabetes mellitus with foot ulcer: Secondary | ICD-10-CM | POA: Diagnosis present

## 2014-09-27 DIAGNOSIS — F419 Anxiety disorder, unspecified: Secondary | ICD-10-CM | POA: Diagnosis present

## 2014-09-27 LAB — GLUCOSE, CAPILLARY
Glucose-Capillary: 120 mg/dL — ABNORMAL HIGH (ref 70–99)
Glucose-Capillary: 146 mg/dL — ABNORMAL HIGH (ref 70–99)
Glucose-Capillary: 131 mg/dL — ABNORMAL HIGH (ref 70–99)
Glucose-Capillary: 120 mg/dL — ABNORMAL HIGH (ref 70–99)

## 2014-09-27 LAB — CBC
HCT: 46.4 % (ref 39.0–52.0)
Hemoglobin: 15.9 g/dL (ref 13.0–17.0)
MCH: 32.1 pg (ref 26.0–34.0)
MCHC: 34.3 g/dL (ref 30.0–36.0)
MCV: 93.7 fL (ref 78.0–100.0)
Platelets: 153 10*3/uL (ref 150–400)
RBC: 4.95 MIL/uL (ref 4.22–5.81)
RDW: 14.8 % (ref 11.5–15.5)
WBC: 10 10*3/uL (ref 4.0–10.5)

## 2014-09-27 LAB — COMPREHENSIVE METABOLIC PANEL
ALT: 11 U/L — ABNORMAL LOW (ref 17–63)
AST: 15 U/L (ref 15–41)
Albumin: 3.3 g/dL — ABNORMAL LOW (ref 3.5–5.0)
Alkaline Phosphatase: 45 U/L (ref 38–126)
Anion gap: 9 (ref 5–15)
BUN: 32 mg/dL — ABNORMAL HIGH (ref 6–20)
CO2: 27 mmol/L (ref 22–32)
Calcium: 8.3 mg/dL — ABNORMAL LOW (ref 8.9–10.3)
Chloride: 100 mmol/L — ABNORMAL LOW (ref 101–111)
Creatinine, Ser: 2.02 mg/dL — ABNORMAL HIGH (ref 0.61–1.24)
GFR calc Af Amer: 39 mL/min — ABNORMAL LOW (ref >60)
GFR calc non Af Amer: 34 mL/min — ABNORMAL LOW (ref >60)
Glucose, Bld: 110 mg/dL — ABNORMAL HIGH (ref 70–99)
Potassium: 3.5 mmol/L (ref 3.5–5.1)
Sodium: 136 mmol/L (ref 135–145)
Total Bilirubin: 1 mg/dL (ref 0.3–1.2)
Total Protein: 6.7 g/dL (ref 6.5–8.1)

## 2014-09-27 LAB — MRSA PCR SCREENING: MRSA by PCR: NEGATIVE

## 2014-09-27 LAB — URINALYSIS, ROUTINE W REFLEX MICROSCOPIC
Bilirubin Urine: NEGATIVE
Glucose, UA: 250 mg/dL — AB
Ketones, ur: NEGATIVE mg/dL
Leukocytes, UA: NEGATIVE
Nitrite: NEGATIVE
Protein, ur: 100 mg/dL — AB
Specific Gravity, Urine: 1.02 (ref 1.005–1.030)
Urobilinogen, UA: 0.2 mg/dL (ref 0.0–1.0)
pH: 6.5 (ref 5.0–8.0)

## 2014-09-27 LAB — URINE MICROSCOPIC-ADD ON

## 2014-09-27 MED ORDER — ONDANSETRON HCL 4 MG/2ML IJ SOLN: 4.0000 mg | Freq: Four times a day (QID) | INTRAMUSCULAR | Status: DC | PRN

## 2014-09-27 MED ORDER — INSULIN ASPART 100 UNIT/ML ~~LOC~~ SOLN
0.0000 [IU] | Freq: Three times a day (TID) | SUBCUTANEOUS | Status: DC
  Administered 2014-09-27 (×2): 1 [IU] via SUBCUTANEOUS
  Administered 2014-09-28: 2 [IU] via SUBCUTANEOUS
  Administered 2014-09-28: 1 [IU] via SUBCUTANEOUS

## 2014-09-27 MED ORDER — VANCOMYCIN HCL IN DEXTROSE 1-5 GM/200ML-% IV SOLN
1000.0000 mg | Freq: Once | INTRAVENOUS | Status: AC
  Administered 2014-09-27: 1000 mg via INTRAVENOUS
  Filled 2014-09-27: qty 200

## 2014-09-27 MED ORDER — ENOXAPARIN SODIUM 40 MG/0.4ML ~~LOC~~ SOLN
40.0000 mg | SUBCUTANEOUS | Status: DC
  Administered 2014-09-27 – 2014-09-28 (×2): 40 mg via SUBCUTANEOUS
  Filled 2014-09-27 (×2): qty 0.4

## 2014-09-27 MED ORDER — LIRAGLUTIDE 18 MG/3ML ~~LOC~~ SOPN
1.8000 mg | PEN_INJECTOR | Freq: Every day | SUBCUTANEOUS | Status: DC
  Filled 2014-09-27 (×2): qty 0.3

## 2014-09-27 MED ORDER — ACETAMINOPHEN 650 MG RE SUPP: 650.0000 mg | Freq: Four times a day (QID) | RECTAL | Status: DC | PRN

## 2014-09-27 MED ORDER — ROSUVASTATIN CALCIUM 20 MG PO TABS
20.0000 mg | ORAL_TABLET | Freq: Every day | ORAL | Status: DC
  Administered 2014-09-27 – 2014-09-28 (×2): 20 mg via ORAL
  Filled 2014-09-27 (×2): qty 1

## 2014-09-27 MED ORDER — INSULIN GLARGINE 100 UNIT/ML ~~LOC~~ SOLN
20.0000 [IU] | Freq: Every morning | SUBCUTANEOUS | Status: DC
  Administered 2014-09-27 – 2014-09-28 (×2): 20 [IU] via SUBCUTANEOUS
  Filled 2014-09-27 (×3): qty 0.2

## 2014-09-27 MED ORDER — ZOLPIDEM TARTRATE 5 MG PO TABS
10.0000 mg | ORAL_TABLET | Freq: Every evening | ORAL | Status: DC | PRN
  Administered 2014-09-27 (×2): 10 mg via ORAL
  Filled 2014-09-27 (×2): qty 2

## 2014-09-27 MED ORDER — ALBUTEROL SULFATE (2.5 MG/3ML) 0.083% IN NEBU: 2.5000 mg | INHALATION_SOLUTION | RESPIRATORY_TRACT | Status: DC | PRN

## 2014-09-27 MED ORDER — SODIUM CHLORIDE 0.9 % IV SOLN
INTRAVENOUS | Status: DC
  Administered 2014-09-27 – 2014-09-28 (×3): via INTRAVENOUS

## 2014-09-27 MED ORDER — AMLODIPINE BESYLATE 5 MG PO TABS
10.0000 mg | ORAL_TABLET | Freq: Every day | ORAL | Status: DC
  Administered 2014-09-27 – 2014-09-28 (×2): 10 mg via ORAL
  Filled 2014-09-27 (×2): qty 2

## 2014-09-27 MED ORDER — ASPIRIN EC 81 MG PO TBEC
81.0000 mg | DELAYED_RELEASE_TABLET | Freq: Every day | ORAL | Status: DC
  Administered 2014-09-27 – 2014-09-28 (×2): 81 mg via ORAL
  Filled 2014-09-27 (×2): qty 1

## 2014-09-27 MED ORDER — VANCOMYCIN HCL IN DEXTROSE 1-5 GM/200ML-% IV SOLN
INTRAVENOUS | Status: AC
  Filled 2014-09-27: qty 200

## 2014-09-27 MED ORDER — LIRAGLUTIDE 18 MG/3ML ~~LOC~~ SOLN: 1.8000 mg | Freq: Every day | SUBCUTANEOUS | Status: DC

## 2014-09-27 MED ORDER — MECLIZINE HCL 12.5 MG PO TABS: 25.0000 mg | ORAL_TABLET | Freq: Three times a day (TID) | ORAL | Status: DC | PRN

## 2014-09-27 MED ORDER — PANTOPRAZOLE SODIUM 40 MG PO TBEC
40.0000 mg | DELAYED_RELEASE_TABLET | Freq: Every day | ORAL | Status: DC
  Administered 2014-09-27 – 2014-09-28 (×2): 40 mg via ORAL
  Filled 2014-09-27 (×3): qty 1

## 2014-09-27 MED ORDER — POTASSIUM CHLORIDE 10 MEQ/100ML IV SOLN
10.0000 meq | INTRAVENOUS | Status: AC
Start: 2014-09-27 — End: 2014-09-27
  Administered 2014-09-27 (×3): 10 meq via INTRAVENOUS
  Filled 2014-09-27 (×2): qty 100

## 2014-09-27 MED ORDER — RAMIPRIL 5 MG PO CAPS
10.0000 mg | ORAL_CAPSULE | Freq: Every day | ORAL | Status: DC
  Administered 2014-09-27: 10 mg via ORAL
  Filled 2014-09-27: qty 2

## 2014-09-27 MED ORDER — CARVEDILOL 12.5 MG PO TABS
37.5000 mg | ORAL_TABLET | Freq: Two times a day (BID) | ORAL | Status: DC
Start: 2014-09-27 — End: 2014-09-28
  Administered 2014-09-27 – 2014-09-28 (×3): 37.5 mg via ORAL
  Filled 2014-09-27 (×3): qty 3

## 2014-09-27 MED ORDER — ACETAMINOPHEN 325 MG PO TABS
650.0000 mg | ORAL_TABLET | Freq: Four times a day (QID) | ORAL | Status: DC | PRN
Start: 2014-09-27 — End: 2014-09-28
  Administered 2014-09-27 – 2014-09-28 (×3): 650 mg via ORAL
  Filled 2014-09-27 (×3): qty 2

## 2014-09-27 MED ORDER — ACETAMINOPHEN 325 MG PO TABS
ORAL_TABLET | ORAL | Status: AC
  Filled 2014-09-27: qty 2

## 2014-09-27 MED ORDER — POTASSIUM CHLORIDE 10 MEQ/100ML IV SOLN
INTRAVENOUS | Status: AC
  Filled 2014-09-27: qty 100

## 2014-09-27 MED ORDER — CLONAZEPAM 0.5 MG PO TABS
0.5000 mg | ORAL_TABLET | Freq: Every day | ORAL | Status: DC
  Administered 2014-09-27 – 2014-09-28 (×2): 0.5 mg via ORAL
  Filled 2014-09-27 (×2): qty 1

## 2014-09-27 MED ORDER — ONDANSETRON HCL 4 MG PO TABS: 4.0000 mg | ORAL_TABLET | Freq: Four times a day (QID) | ORAL | Status: DC | PRN

## 2014-09-27 MED ORDER — VANCOMYCIN HCL 10 G IV SOLR
1500.0000 mg | INTRAVENOUS | Status: DC
  Administered 2014-09-27: 1500 mg via INTRAVENOUS
  Filled 2014-09-27 (×2): qty 1500

## 2014-09-27 NOTE — Care Management Note (Signed)
Case Management Note  Patient Details  Name: BLAYNE DUBA MRN: [id number] Date of Birth: 07-02-1950  Expected Discharge Date:  09/29/14               Expected Discharge Plan:  Home/Self Care  In-House Referral:  NA  Discharge planning Services  CM Consult  Post Acute Care Choice:  NA Choice offered to:  NA  DME Arranged:    DME Agency:     HH Arranged:    [place] Agency:     Status of Service:  Completed, signed off  Medicare Important Message Given:    Date Medicare IM Given:    Medicare IM give by:    Date Additional Medicare IM Given:    Additional Medicare Important Message give by:     If discussed at [place] of Stay Meetings, dates discussed:    Additional Comments: Pt is from home, lives alone. Pt has no HH services or DME's prior to admission. Pt plans to DC home with self care. No CM needs.   Sherald Barge, RN 09/27/2014, 2:44 PM

## 2014-09-27 NOTE — Progress Notes (Signed)
Patient ID: [name], male   DOB: October 23, 1950, 64 y.o.   MRN: [id number] [facility] PROGRESS NOTE  [place] [id number] DOB: April 09, 1951 DOA: 09/26/2014 PCP: [name], MD  Brief narrative:    HPI: 64 year-old male with past medical history of uncontrolled diabetes, CAD, hypertension, dyslipidemia, CKD stage 3, COPD who presented to [facility] ED with concern for fevers and chills started on the evening prior to the admission. He has an ulceration on the right foot which he reports did have some purulent drainage.  On admission, pt was hemodynamically stable. His Oxygen saturation was 89% on room air. Blood work showed potassium of 2.8, creatinine of 2.26 (baseline 1.3 in 2012). He has had purulent drainage of the right foot ulcer and was admitted for further evaluation. He was placed on vancomycin.  Barrier to discharge: on IV vanco for right foot ulceration. WOC to see the patient 5/11 and if he feels better likely discharge 09/28/14.   Assessment/Plan:    Principal Problem: Diabetic foot infection - Likely due to poorly controlled diabetes. No previous A1c on file to determine how uncontrolled DM is. - WOC consulted for input on dressing recommendations / changes.  - Started on vancomycin, continue this for next 24 hours and then transition to PO (?clindamycin) on discharge  - Pain currently controlled.  Active Problems: Diabetes mellitus type 2, uncontrolled with renal and peripheral vascular manifestations - As mentioned, no previous A1c on file. A1c on this admission pending - Appreciate DM coordinator consult and recommendations - Continue current insulin regimen: Lantus 20 units in am along with SSI - Continue Linaglutide at bedtime   Essential hypertension - Continue Norvasc and Coreg - Hold ramipril due to worsening renal insufficiency  Chronic kidney disease, stage 3 / Diabetic nephropathy  - Baseline creatinine in 2012 1.3 and on this admission 2.26 -  Possibly due to ramipril which was placed on hold  - Follow up renal function in am  Anxiety - Continue clonazepam - Pt stable, does not feel anxious   Acute on chronic respiratory failure with hypoxia - Due to COPD - Respiratory status stable - Continue oxygen support via [place] to keep O2 sat above 90%  Dyslipidemia - Continue crestor     DVT Prophylaxis  - Lovenox subQ ordered    Code Status: Full.  Family Communication:  plan of care discussed with the patient; family not at the bedside  Disposition Plan: Home 09/28/2014 if he feels better, if no fever and blood cultures negative   IV access:  Peripheral IV  Procedures and diagnostic studies:    Dg Chest 2 View 09/26/2014   Emphysematous changes in the lungs. No evidence of active pulmonary disease.   Electronically Signed   By: Lucienne Capers M.D.   On: 09/26/2014 23:46   Dg Foot 2 Views Right 09/26/2014  Previous amputation of the left second toe. Degenerative changes. No acute bony abnormalities.     Medical Consultants:  None   Other Consultants:  Wound care   IAnti-Infectives:   Vancomycin 09/27/2014 -->    Phyillis Dascoli, MD  [facility] Pager [phone]  Time spent in minutes: 15 minutes  If 7PM-7AM, please contact night-coverage [email or web address] Password [facility] 09/27/2014, 5:43 PM   LOS: 0 days    HPI/Subjective: No acute overnight events. Patient reports feeling better. No reports of significant pain.   Objective: Filed Vitals:   09/27/14 0800 09/27/14 0951 09/27/14 1100 09/27/14 1415  BP:  113/58  111/57  Pulse:  70  66  Temp: 98.9 F (37.2 C)  98.1 F (36.7 C) 98.7 F (37.1 C)  TempSrc: Oral  Oral   Resp:    20  Height:      Weight:      SpO2:    98%    Intake/Output Summary (Last 24 hours) at 09/27/14 1743 Last data filed at 09/27/14 1741  Gross per 24 hour  Intake    480 ml  Output    500 ml  Net    -20 ml    Exam:   General:  Pt is alert, follows commands appropriately, not  in acute distress  Cardiovascular: Regular rate and rhythm, S1/S2, no murmurs  Respiratory: Clear to auscultation bilaterally, no wheezing, no crackles, no rhonchi  Abdomen: Soft, non tender, non distended, bowel sounds present  Extremities: No edema, right foot wrapped, pulses DP and PT palpable bilaterally  Neuro: Grossly nonfocal  Data Reviewed: Basic Metabolic Panel:  Recent Labs Lab 09/26/14 2143 09/27/14 0558  NA 134* 136  K 2.8* 3.5  CL 97* 100*  CO2 27 27  GLUCOSE 102* 110*  BUN 38* 32*  CREATININE 2.26* 2.02*  CALCIUM 8.8* 8.3*   Liver Function Tests:  Recent Labs Lab 09/26/14 2143 09/27/14 0558  AST 18 15  ALT 12* 11*  ALKPHOS 51 45  BILITOT 0.9 1.0  PROT 7.4 6.7  ALBUMIN 3.7 3.3*   No results for input(s): LIPASE, AMYLASE in the last 168 hours. No results for input(s): AMMONIA in the last 168 hours. CBC:  Recent Labs Lab 09/26/14 2143 09/27/14 0558  WBC 10.0 10.0  NEUTROABS 8.2*  --   HGB 16.4 15.9  HCT 47.3 46.4  MCV 93.5 93.7  PLT 170 153   Cardiac Enzymes: No results for input(s): CKTOTAL, CKMB, CKMBINDEX, TROPONINI in the last 168 hours. BNP: Invalid input(s): POCBNP CBG:  Recent Labs Lab 09/27/14 0735 09/27/14 1131 09/27/14 1621  GLUCAP 120* 146* 131*    Recent Results (from the past 240 hour(s))  Culture, blood (routine x 2)     Status: None (Preliminary result)   Collection Time: 09/26/14 10:50 PM  Result Value Ref Range Status   Specimen Description BLOOD RIGHT ARM  Final   Special Requests BOTTLES DRAWN AEROBIC AND ANAEROBIC [place]  Final   Culture NO GROWTH 1 DAY  Final   Report Status PENDING  Incomplete  Culture, blood (routine x 2)     Status: None (Preliminary result)   Collection Time: 09/26/14 10:55 PM  Result Value Ref Range Status   Specimen Description BLOOD RIGHT HAND  Final   Special Requests   Final    BOTTLES DRAWN AEROBIC AND ANAEROBIC AEB=7CC ANA=4CC   Culture NO GROWTH 1 DAY  Final   Report  Status PENDING  Incomplete  Wound culture     Status: None (Preliminary result)   Collection Time: 09/26/14 10:56 PM  Result Value Ref Range Status   Specimen Description WOUND FOOT  Final   Special Requests NONE  Final   Gram Stain PENDING  Incomplete   Culture PENDING  Incomplete   Report Status PENDING  Incomplete  MRSA PCR Screening     Status: None   Collection Time: 09/27/14  1:28 AM  Result Value Ref Range Status   MRSA by PCR NEGATIVE NEGATIVE Final     Scheduled Meds: . amLODipine  10 mg Oral Daily  . aspirin EC  81 mg Oral Daily  . carvedilol  37.5 mg Oral BID WC  . clonazePAM  0.5 mg Oral Daily  . enoxaparin (LOVENOX) injection  40 mg Subcutaneous Q24H  . insulin aspart  0-9 Units Subcutaneous TID WC  . insulin glargine  20 Units Subcutaneous q morning - 10a  . Liraglutide  1.8 mg Subcutaneous QHS  . pantoprazole  40 mg Oral Daily  . ramipril  10 mg Oral Daily  . rosuvastatin  20 mg Oral Daily  . vancomycin  1,500 mg Intravenous Q24H   Continuous Infusions: . sodium chloride 75 mL/hr at 09/27/14 1627

## 2014-09-27 NOTE — Progress Notes (Signed)
Patient arrived to floor. Alert and oriented. No complaints voiced at this time.

## 2014-09-27 NOTE — Progress Notes (Signed)
ANTIBIOTIC CONSULT NOTE- follow up  Pharmacy Consult for vancomycin Indication: wound infection  No Known Allergies  Patient Measurements: Height: 6\' 3"  (190.5 cm) Weight: 243 lb 6.2 oz (110.4 kg) IBW/kg (Calculated) : 84.5  Vital Signs: Temp: 98.1 F (36.7 C) (05/10 0600) Temp Source: Oral (05/10 0510) BP: 134/77 mmHg (05/10 0510) Pulse Rate: 90 (05/10 0510)  Labs:  Recent Labs  09/26/14 2143 09/27/14 0558  WBC 10.0 10.0  HGB 16.4 15.9  PLT 170 153  CREATININE 2.26* 2.02*   Estimated Creatinine Clearance: 51.5 mL/min (by C-G formula based on Cr of 2.02).  Microbiology: Blood culture x 2 on 5/9 at 2250 and 2255 - NGTD Wound culture x 1 on 5/9 at 2256 - NGTD  Medical History: Past Medical History  Diagnosis Date  . CAD (coronary artery disease)     multivessel  . Two-vessel coronary artery disease     moderate. by cath in 2003. Status post stenting of the mdi RCA November 1999 normal left ventricular ejection fraction  . Dyslipidemia   . DM2 (diabetes mellitus, type 2)   . HTN (hypertension)   . COPD (chronic obstructive pulmonary disease)     with ongoing tobacco use and patient failed sham takes  . Morbid obesity   . 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 Chronic renal insufficiency   . Reflux esophagitis     hx  . 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    Anti-infectives    Start     Dose/Rate Route Frequency Ordered Stop   09/27/14 2200  vancomycin (VANCOCIN) 1,500 mg in sodium chloride 0.9 % 500 mL IVPB     1,500 mg 250 mL/hr over 120 Minutes Intravenous Every 24 hours 09/27/14 0741     09/27/14 0230  vancomycin (VANCOCIN) IVPB 1000 mg/200 mL premix     1,000 mg 200 mL/hr over 60 Minutes Intravenous  Once 09/27/14 0221 09/27/14 0401   09/26/14 2330  vancomycin (VANCOCIN) IVPB 1000 mg/200 mL premix     1,000 mg 200 mL/hr over 60  Minutes Intravenous  Once 09/26/14 2324 09/27/14 0041     Assessment: Pt is a 64 yo M initiated on vancomycin in ED for diabetic foot infection. Due to morbid obesity, pt was given a loading dose of 2000mg  in ED on admission. Estimated Normalized ClCr ~ 71ml/min  Goal of Therapy:  Vancomycin trough level 15-20 mcg/ml  Plan:   Vancomycin 1500mg  IV q24hrs starting tonight  Check Vancomycin trough level at steady state  Monitor labs, renal fxn, progress, and cultures  Hart Robinsons A, RPH 09/27/2014,7:41 AM

## 2014-09-27 NOTE — Progress Notes (Signed)
ANTIBIOTIC CONSULT NOTE-Preliminary  Pharmacy Consult for vancomycin Indication: wound infection  No Known Allergies  Patient Measurements: Height: 6\' 3"  (190.5 cm) Weight: 243 lb 6.2 oz (110.4 kg) IBW/kg (Calculated) : 84.5  Vital Signs: Temp: 99.2 F (37.3 C) (05/10 0127) Temp Source: Oral (05/10 0127) BP: 97/55 mmHg (05/10 0127) Pulse Rate: 79 (05/10 0101)  Labs:  Recent Labs  09/26/14 2143  WBC 10.0  HGB 16.4  PLT 170  CREATININE 2.26*    Estimated Creatinine Clearance: 46.1 mL/min (by C-G formula based on Cr of 2.26).   Microbiology: Blood culture x 2 on 5/9 at 2250 and 2255 - NGTD Wound culture x 1 on 5/9 at 2256 - NGTD   Medical History: Past Medical History  Diagnosis Date  . CAD (coronary artery disease)     multivessel  . Two-vessel coronary artery disease     moderate. by cath in 2003. Status post stenting of the mdi RCA November 1999 normal left ventricular ejection fraction  . Dyslipidemia   . DM2 (diabetes mellitus, type 2)   . HTN (hypertension)   . COPD (chronic obstructive pulmonary disease)     with ongoing tobacco use and patient failed sham takes  . Morbid obesity   . 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 Chronic renal insufficiency   . Reflux esophagitis     hx  . 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     Medications:  Vancomycin 1g IV x 1 on 5/9 at 2336  Assessment: Pt is a 64 yo M initiated on vancomycin in ED for diabetic foot infection. Due to morbid obesity, pt is insufficiently treated with 1g dose received. Will need to received an additional dose of vancomycin 1g for a total loading dose of 2g IV.  Goal of Therapy:  Vancomycin trough level 15-20 mcg/ml  Plan:  Preliminary review of pertinent patient information completed.  Protocol will be initiated with a one-time dose(s) of vancomycin 1g IV to be  given in addition to the vancomycin already administered.  Forestine Na clinical pharmacist will complete review during morning rounds to assess patient and finalize treatment regimen.  Lovelle Deitrick [place], [place] 09/27/2014,2:11 AM

## 2014-09-27 NOTE — Progress Notes (Signed)
Wound to bottom of right foot cleansed with normal saline and gauze dressing applied. No drainage noted at this time.

## 2014-09-27 NOTE — H&P (Signed)
PCP:   [name], MD   Chief Complaint:  Fever and chills  HPI: 64 year old male who   has a past medical history of CAD (coronary artery disease); Two-vessel coronary artery disease; Dyslipidemia; DM2 (diabetes mellitus, type 2); HTN (hypertension); COPD (chronic obstructive pulmonary disease); Morbid obesity; Edema; Chronic renal insufficiency; Reflux esophagitis; and PVD (peripheral vascular disease). Today came to the hospital with complaints of fever and chills started earlier this evening. Patient also complains of generalized body aches, also has been having cough with brown colored phlegm. Patient has a history of diabetes mellitus and has also on the right foot which is present for last several weeks. Patient says that he has had recurrent sepsis and the current symptoms are similar to the previous episodes. He denies chest pain, no shortness of breath. No nausea vomiting but had one episode of diarrhea. In the ED patient found to be having fever, hypokalemia. He denies dysuria urgency or frequency of urination.  Allergies:  No Known Allergies    Past Medical History  Diagnosis Date  . CAD (coronary artery disease)     multivessel  . Two-vessel coronary artery disease     moderate. by cath in 2003. Status post stenting of the mdi RCA November 1999 normal left ventricular ejection fraction  . Dyslipidemia   . DM2 (diabetes mellitus, type 2)   . HTN (hypertension)   . COPD (chronic obstructive pulmonary disease)     with ongoing tobacco use and patient failed sham takes  . Morbid obesity   . 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 Chronic renal insufficiency   . Reflux esophagitis     hx  . 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     Past Surgical History  Procedure Laterality Date  . Back surgery    . Abdominal surgery    . Diabetic ulcers       Prior to Admission medications   Medication Sig Start Date End Date Taking? Authorizing Provider  albuterol (VENTOLIN HFA) 108 (90 BASE) MCG/ACT inhaler Inhale 2 puffs into the lungs every 6 (six) hours as needed.   Yes Historical Provider, MD  amLODipine (NORVASC) 10 MG tablet Take 10 mg by mouth daily.     Yes Historical Provider, MD  aspirin 81 MG tablet Take 81 mg by mouth daily.     Yes Historical Provider, MD  Canagliflozin (INVOKANA) 100 MG TABS Take 1 tablet by mouth daily.   Yes Historical Provider, MD  carvedilol (COREG) 25 MG tablet Take 1.5 tablets (37.5 mg total) by mouth 2 (two) times daily with a meal. Patient taking differently: Take 25-50 mg by mouth 2 (two) times daily. Two tablets in the morning and one tablet at bedtime 03/21/14  Yes Arnoldo Lenis, MD  cholecalciferol (VITAMIN D) 1000 UNITS tablet Take 1,000 Units by mouth daily.   Yes Historical Provider, MD  clonazePAM (KLONOPIN) 0.5 MG tablet Take 0.5 mg by mouth daily.    Yes Historical Provider, MD  hydrochlorothiazide (HYDRODIURIL) 25 MG tablet Take 1 tablet (25 mg total) by mouth daily. 09/23/13  Yes Arnoldo Lenis, MD  insulin glargine (LANTUS) 100 UNIT/ML injection Inject 20 Units into the skin every morning.    Yes Historical Provider, MD  Liraglutide (VICTOZA) 18 MG/3ML SOLN Inject 1.8 mg into the skin at bedtime.    Yes Historical Provider, MD  meclizine (ANTIVERT) 25 MG tablet Take 25 mg by mouth 3 (three) times daily as needed for dizziness.   Yes Historical Provider, MD  Multiple Vitamin (MULTIVITAMIN) tablet Take 1 tablet by mouth daily.     Yes Historical Provider, MD  omeprazole (PRILOSEC) 20 MG capsule Take 20 mg by mouth daily.     Yes Historical Provider, MD  ramipril (ALTACE) 10 MG tablet Take 10 mg by mouth daily.     Yes Historical Provider, MD  rosuvastatin (CRESTOR) 20 MG tablet Take 1 tablet (20 mg total) by mouth daily. 03/31/14  Yes Arnoldo Lenis, MD  zolpidem (AMBIEN) 10 MG tablet Take  10 mg by mouth at bedtime as needed.   Yes Historical Provider, MD    Social History:  reports that he has been smoking Cigarettes.  He started smoking about 46 years ago. He has a 40 pack-year smoking history. He has never used smokeless tobacco. He reports that he does not drink alcohol or use illicit drugs.  Family History  Problem Relation Age of Onset  . Diabetes Mother   . Alcohol abuse Father      All the positives are listed in BOLD  Review of Systems:  HEENT: Headache, blurred vision, runny nose, sore throat Neck: Hypothyroidism, hyperthyroidism,,lymphadenopathy Chest : Shortness of breath, history of COPD, Asthma Heart : Chest pain, history of coronary arterey disease GI:  Nausea, vomiting, diarrhea, constipation, GERD GU: Dysuria, urgency, frequency of urination, hematuria Neuro: Stroke, seizures, syncope Psych: Depression, anxiety, hallucinations   Physical Exam: Blood pressure 129/54, pulse 85, temperature 99.6 F (37.6 C), temperature source Oral, resp. rate 18, height 6\' 4"  (1.93 m), weight 110.224 kg (243 lb), SpO2 89 %. Constitutional:   Patient is a well-developed and well-nourished * male in no acute distress and cooperative with exam. Head: Normocephalic and atraumatic Mouth: Mucus membranes moist Eyes: PERRL, EOMI, conjunctivae normal Neck: Supple, No Thyromegaly Cardiovascular: RRR, S1 normal, S2 normal Pulmonary/Chest: CTAB, no wheezes, rales, or rhonchi Abdominal: Soft. Non-tender, non-distended, bowel sounds are normal, no masses, organomegaly, or guarding present.  Neurological: A&O x3, Strength is normal and symmetric bilaterally, cranial nerve II-XII are grossly intact, no focal motor deficit, sensory intact to light touch bilaterally.  Extremities : No Cyanosis, Clubbing. Foot : Patient's right foot has an open wound on the plantar surface with purulent drainage on squeezing   Labs on Admission:  Basic Metabolic Panel:  Recent Labs Lab  09/26/14 2143  NA 134*  K 2.8*  CL 97*  CO2 27  GLUCOSE 102*  BUN 38*  CREATININE 2.26*  CALCIUM 8.8*   Liver Function Tests:  Recent Labs Lab 09/26/14 2143  AST 18  ALT 12*  ALKPHOS 51  BILITOT 0.9  PROT 7.4  ALBUMIN 3.7   No results for input(s): LIPASE, AMYLASE in the last 168 hours. No results for input(s): AMMONIA in the last 168 hours. CBC:  Recent Labs Lab 09/26/14 2143  WBC 10.0  NEUTROABS 8.2*  HGB 16.4  HCT 47.3  MCV 93.5  PLT 170   Radiological Exams on Admission: Dg Chest 2 View  09/26/2014   CLINICAL DATA:  Diabetic ulcer on the right foot. Fever today. Generalized weakness. Smoker.  EXAM: CHEST  2 VIEW  COMPARISON:  11/04/2012.  11/03/2012.  FINDINGS: Emphysematous changes in the lungs. Normal heart size and pulmonary vascularity. No focal airspace disease or consolidation in the lungs. No blunting of costophrenic angles. No pneumothorax. Mediastinal contours appear intact. Old bilateral rib fractures.  Calcified and tortuous aorta.  IMPRESSION: Emphysematous changes in the lungs. No evidence of active pulmonary disease.   Electronically Signed   By: Lucienne Capers M.D.   On: 09/26/2014 23:46   Dg Foot 2 Views Right  09/26/2014   CLINICAL DATA:  Diabetic ulcer on the bottom of the right foot at the third through fifth MTP joints for several weeks. Fever.  EXAM: RIGHT FOOT - 2 VIEW  COMPARISON:  None.  FINDINGS: Previous amputation of the left second toe at the level of the proximal interphalangeal joint. Degenerative changes in the interphalangeal and intertarsal joints. No evidence of focal bone erosion to suggest osteomyelitis. No evidence of acute fracture or dislocation.  IMPRESSION: Previous amputation of the left second toe. Degenerative changes. No acute bony abnormalities.   Electronically Signed   By: Lucienne Capers M.D.   On: 09/26/2014 23:47     Assessment/Plan Active Problems:   CORONARY ATHEROSCLEROSIS NATIVE CORONARY ARTERY   Foot  infection   Diabetic foot infection  Diabetic foot infection  We'll admit the patient, wound culture has been obtained. Will continue with vancomycin. follow the results of blood culture as well as wound culture. X-ray of the foot showed previous amputation of left second toe. Lactic acid 1.91  Acute kidney injury Today patient's creatinine is 2.26 with BUN 38. We'll start IV fluids. Check BMP  in a.m.  Diabetes mellitus Start sliding scale insulin, hold oral hypoglycemic agents  Hypokalemia Replace potassium and check BMP in a.m.  Code status: patient wants to be DO NOT RESUSCITATE   Family discussion: no family at bedside    Time Spent on Admission: 61 min  [facility] Pager: [phone] 09/27/2014, 12:48 AM  If 7PM-7AM, please contact night-coverage  [email or web address]  Password [email or web address]

## 2014-09-28 LAB — BASIC METABOLIC PANEL
Anion gap: 7 (ref 5–15)
BUN: 26 mg/dL — ABNORMAL HIGH (ref 6–20)
CO2: 30 mmol/L (ref 22–32)
Calcium: 8.3 mg/dL — ABNORMAL LOW (ref 8.9–10.3)
Chloride: 102 mmol/L (ref 101–111)
Creatinine, Ser: 1.86 mg/dL — ABNORMAL HIGH (ref 0.61–1.24)
GFR calc Af Amer: 43 mL/min — ABNORMAL LOW (ref >60)
GFR calc non Af Amer: 37 mL/min — ABNORMAL LOW (ref >60)
Glucose, Bld: 97 mg/dL (ref 70–99)
Potassium: 3.3 mmol/L — ABNORMAL LOW (ref 3.5–5.1)
Sodium: 139 mmol/L (ref 135–145)

## 2014-09-28 LAB — HEMOGLOBIN A1C
Hgb A1c MFr Bld: 6.1 % — ABNORMAL HIGH (ref 4.8–5.6)
Mean Plasma Glucose: 128 mg/dL

## 2014-09-28 LAB — GLUCOSE, CAPILLARY
Glucose-Capillary: 124 mg/dL — ABNORMAL HIGH (ref 70–99)
Glucose-Capillary: 168 mg/dL — ABNORMAL HIGH (ref 70–99)

## 2014-09-28 MED ORDER — DOXYCYCLINE HYCLATE 50 MG PO CAPS: 100.0000 mg | ORAL_CAPSULE | Freq: Two times a day (BID) | ORAL | Status: DC

## 2014-09-28 NOTE — Consult Note (Addendum)
WOC wound consult note Reason for Consult: Consult requested for right foot wound; performed via remote camera with nurse tech assistance to measure and assess the wound.  X-ray does not show osteomylitis.  Pt states it is chronic and he is followed by Dr [name], podiatrist, as an outpatient.  He had "skin grafts" placed to this area in the past, but currently he is using Silvadene Q day. Wound type: Right plantar foot with full thickness wound Measurement: Open area 1X.5X.1cm,   Inner wound bed is 85% dry and yellow, 15% darker brown  Drainage (amount, consistency, odor) No odor or drainage or fluctuance when probed Periwound: Surrounded by 2 cm of dry calloused skin which has been trimmed previously.  Dressing procedure/placement/frequency: Patient is well informed regarding topical treatment.  He plans to discharge today and states he has Silvadene at home and will continue to apply it Q day, then cover with gauze; this was the previous plan of care prior to admission.  He states he will follow-up with the podiatrist after discharge and denies further questions. Please re-consult if further assistance is needed.  Thank-you,  Julien Girt MSN, [place], [place], [place], [place]

## 2014-09-28 NOTE — Progress Notes (Signed)
Noted consult for glycemic control. As an outpatient, patient takes Lantus 20 units QAM and Victoza 1.8 mg QHS for diabetes control. Currently she is ordered Lantus 20 units QAM, Novolog 0-9 units TID with meals, Victoza 1.8 mg QHS. CBGs have ranged 97-146 mg/dl over the past 24 hours and A1C was 6.1% on 09/26/14. Glycemic control is within target at this time and diabetes is being well managed as reflected by A1C of 6.1%. Will continue to follow while inpatient.  Thanks, Barnie Alderman, RN, MSN, CCRN, CDE Diabetes Coordinator Inpatient Diabetes Program [phone] (Team Pager from [place] to [facility]) [phone] ([facility] office) [phone] [facility] office)

## 2014-09-28 NOTE — Progress Notes (Signed)
Pt discharged from the unit with his belongings. Wife accompanied NT escort to car.

## 2014-09-28 NOTE — Discharge Summary (Signed)
Physician Discharge Summary  [name] [id number] DOB: [birth year] DOA: 09/26/2014  PCP: [name], MD  Admit date: 09/26/2014 Discharge date: 09/28/2014  Time spent: 60 minutes  Recommendations for Outpatient Follow-up:  1. Follow up with PCP in one week.   Discharge Diagnoses:  Active Problems:   CORONARY ATHEROSCLEROSIS NATIVE CORONARY ARTERY   Foot infection   Diabetic foot infection   CKD stage 3 due to type 2 diabetes mellitus   Diabetes mellitus with renal manifestations, uncontrolled   Benign essential HTN   Discharge Condition: Improved.   Diet recommendation: Carb modified diet.   Filed Weights   09/26/14 1904 09/27/14 0127  Weight: 110.224 kg (243 lb) 110.4 kg (243 lb 6.2 oz)    History of present illness: patient was admitted for fever and chills, felt to be due to cellulitis, on Sep 27, 2014.  As per his H and P;  " 64 year old male who  has a past medical history of CAD (coronary artery disease); Two-vessel coronary artery disease; Dyslipidemia; DM2 (diabetes mellitus, type 2); HTN (hypertension); COPD (chronic obstructive pulmonary disease); Morbid obesity; Edema; Chronic renal insufficiency; Reflux esophagitis; and PVD (peripheral vascular disease). Today came to the hospital with complaints of fever and chills started earlier this evening. Patient also complains of generalized body aches, also has been having cough with brown colored phlegm. Patient has a history of diabetes mellitus and has also on the right foot which is present for last several weeks. Patient says that he has had recurrent sepsis and the current symptoms are similar to the previous episodes. He denies chest pain, no shortness of breath. No nausea vomiting but had one episode of diarrhea. In the ED patient found to be having fever, hypokalemia. He denies dysuria urgency or frequency of urination.  Hospital Course: patient was admitted for diabetic foot infection and was placed on  Vancomycin.  X ray did not show any evidence of osteomyelitis.  His DM has been good controlled.  He was continued on Norvasc and Coreg, and his Ramipril 10mg  were held as his Cr was elevated to 2.26.  With this, it went down to 1.86, and he will be discharged back on his Ramipril, as he is a diabetic hypertensive.  Wound care consult was done, and thru telemedicine, he was seen, and no further recommendation was done.  It was recommended that he continues to use silvadene cream.  He did notice a tick on his leg on the day of discharge.  He will be discharged on Doxycycline 100mg  BID for 7 days, to take with plenty of water.  He will follow up with his PCP in one week.  Thank you for allowing me to particiapte in his care.    Consultations:  Wound care consult.   Diabetic Coordinator.   Discharge Exam: Filed Vitals:   09/28/14 0500  BP: 126/68  Pulse: 73  Temp: 98.9 F (37.2 C)  Resp: 20    General: alert and orient. Cardiovascular: S1S2 Respiratory: clear.   Discharge Instructions   Discharge Instructions    Diet - low sodium heart healthy    Complete by:  As directed      Discharge instructions    Complete by:  As directed   Use silvadine and dressing as you have been doing.  Follow up with your PCP.  Take Doxycycline with plenty of water twice daily for 7 days.     Increase activity slowly    Complete by:  As directed  Current Discharge Medication List    CONTINUE these medications which have NOT CHANGED   Details  albuterol (VENTOLIN HFA) 108 (90 BASE) MCG/ACT inhaler Inhale 2 puffs into the lungs every 6 (six) hours as needed.    amLODipine (NORVASC) 10 MG tablet Take 10 mg by mouth daily.      aspirin 81 MG tablet Take 81 mg by mouth daily.      Canagliflozin (INVOKANA) 100 MG TABS Take 1 tablet by mouth daily.    carvedilol (COREG) 25 MG tablet Take 1.5 tablets (37.5 mg total) by mouth 2 (two) times daily with a meal. Qty: 90 tablet, Refills: 6     cholecalciferol (VITAMIN D) 1000 UNITS tablet Take 1,000 Units by mouth daily.    clonazePAM (KLONOPIN) 0.5 MG tablet Take 0.5 mg by mouth daily.     hydrochlorothiazide (HYDRODIURIL) 25 MG tablet Take 1 tablet (25 mg total) by mouth daily. Qty: 25 tablet, Refills: 6    insulin glargine (LANTUS) 100 UNIT/ML injection Inject 20 Units into the skin every morning.     Liraglutide (VICTOZA) 18 MG/3ML SOLN Inject 1.8 mg into the skin at bedtime.     Multiple Vitamin (MULTIVITAMIN) tablet Take 1 tablet by mouth daily.      omeprazole (PRILOSEC) 20 MG capsule Take 20 mg by mouth daily.      ramipril (ALTACE) 10 MG tablet Take 10 mg by mouth daily.      rosuvastatin (CRESTOR) 20 MG tablet Take 1 tablet (20 mg total) by mouth daily. Qty: 30 tablet, Refills: 6      STOP taking these medications     meclizine (ANTIVERT) 25 MG tablet      zolpidem (AMBIEN) 10 MG tablet        No Known Allergies    The results of significant diagnostics from this hospitalization (including imaging, microbiology, ancillary and laboratory) are listed below for reference.    Significant Diagnostic Studies: Dg Chest 2 View  09/26/2014   CLINICAL DATA:  Diabetic ulcer on the right foot. Fever today. Generalized weakness. Smoker.  EXAM: CHEST  2 VIEW  COMPARISON:  11/04/2012.  11/03/2012.  FINDINGS: Emphysematous changes in the lungs. Normal heart size and pulmonary vascularity. No focal airspace disease or consolidation in the lungs. No blunting of costophrenic angles. No pneumothorax. Mediastinal contours appear intact. Old bilateral rib fractures. Calcified and tortuous aorta.  IMPRESSION: Emphysematous changes in the lungs. No evidence of active pulmonary disease.   Electronically Signed   By: Lucienne Capers M.D.   On: 09/26/2014 23:46   Dg Foot 2 Views Right  09/26/2014   CLINICAL DATA:  Diabetic ulcer on the bottom of the right foot at the third through fifth MTP joints for several weeks. Fever.  EXAM:  RIGHT FOOT - 2 VIEW  COMPARISON:  None.  FINDINGS: Previous amputation of the left second toe at the level of the proximal interphalangeal joint. Degenerative changes in the interphalangeal and intertarsal joints. No evidence of focal bone erosion to suggest osteomyelitis. No evidence of acute fracture or dislocation.  IMPRESSION: Previous amputation of the left second toe. Degenerative changes. No acute bony abnormalities.   Electronically Signed   By: Lucienne Capers M.D.   On: 09/26/2014 23:47    Microbiology: Recent Results (from the past 240 hour(s))  Culture, blood (routine x 2)     Status: None (Preliminary result)   Collection Time: 09/26/14 10:50 PM  Result Value Ref Range Status   Specimen Description BLOOD RIGHT  ARM  Final   Special Requests BOTTLES DRAWN AEROBIC AND ANAEROBIC [place]  Final   Culture NO GROWTH 2 DAYS  Final   Report Status PENDING  Incomplete  Culture, blood (routine x 2)     Status: None (Preliminary result)   Collection Time: 09/26/14 10:55 PM  Result Value Ref Range Status   Specimen Description BLOOD RIGHT HAND  Final   Special Requests   Final    BOTTLES DRAWN AEROBIC AND ANAEROBIC AEB=7CC ANA=4CC   Culture NO GROWTH 2 DAYS  Final   Report Status PENDING  Incomplete  Wound culture     Status: None (Preliminary result)   Collection Time: 09/26/14 10:56 PM  Result Value Ref Range Status   Specimen Description WOUND FOOT  Final   Special Requests NONE  Final   Gram Stain   Final    NO WBC SEEN RARE SQUAMOUS EPITHELIAL CELLS PRESENT RARE GRAM POSITIVE COCCI IN PAIRS Performed at [facility]    Culture PENDING  Incomplete   Report Status PENDING  Incomplete  MRSA PCR Screening     Status: None   Collection Time: 09/27/14  1:28 AM  Result Value Ref Range Status   MRSA by PCR NEGATIVE NEGATIVE Final    Comment:        The GeneXpert MRSA Assay (FDA approved for NASAL specimens only), is one component of a comprehensive MRSA  colonization surveillance program. It is not intended to diagnose MRSA infection nor to guide or monitor treatment for MRSA infections.      Labs: Basic Metabolic Panel:  Recent Labs Lab 09/26/14 2143 09/27/14 0558 09/28/14 0415  NA 134* 136 139  K 2.8* 3.5 3.3*  CL 97* 100* 102  CO2 27 27 30   GLUCOSE 102* 110* 97  BUN 38* 32* 26*  CREATININE 2.26* 2.02* 1.86*  CALCIUM 8.8* 8.3* 8.3*   Liver Function Tests:  Recent Labs Lab 09/26/14 2143 09/27/14 0558  AST 18 15  ALT 12* 11*  ALKPHOS 51 45  BILITOT 0.9 1.0  PROT 7.4 6.7  ALBUMIN 3.7 3.3*   CBC:  Recent Labs Lab 09/26/14 2143 09/27/14 0558  WBC 10.0 10.0  NEUTROABS 8.2*  --   HGB 16.4 15.9  HCT 47.3 46.4  MCV 93.5 93.7  PLT 170 153    CBG:  Recent Labs Lab 09/27/14 1131 09/27/14 1621 09/27/14 2151 09/28/14 0742 09/28/14 1147  GLUCAP 146* 131* 120* 124* 168*       Signed:  Toua Stites  [facility] 09/28/2014, 12:14 PM

## 2014-09-28 NOTE — Care Management Note (Signed)
Case Management Note  Patient Details  Name: [name] MRN: [id number] Date of Birth: 05/13/1951  Expected Discharge Date:  09/29/14               Expected Discharge Plan:  Home/Self Care  In-House Referral:  NA  Discharge planning Services  CM Consult  Post Acute Care Choice:  NA Choice offered to:  NA  DME Arranged:    DME Agency:     HH Arranged:    [place] Agency:     Status of Service:  Completed, signed off  Medicare Important Message Given:    Date Medicare IM Given:    Medicare IM give by:    Date Additional Medicare IM Given:    Additional Medicare Important Message give by:     If discussed at [place] of Stay Meetings, dates discussed:    Additional Comments: Pt discharging home today, no CM needs.   Sherald Barge, RN 09/28/2014, 12:07 PM

## 2014-09-30 DIAGNOSIS — N189 Chronic kidney disease, unspecified: Secondary | ICD-10-CM | POA: Diagnosis not present

## 2014-09-30 DIAGNOSIS — K219 Gastro-esophageal reflux disease without esophagitis: Secondary | ICD-10-CM | POA: Diagnosis not present

## 2014-09-30 DIAGNOSIS — E1342 Other specified diabetes mellitus with diabetic polyneuropathy: Secondary | ICD-10-CM | POA: Diagnosis not present

## 2014-09-30 DIAGNOSIS — Z1389 Encounter for screening for other disorder: Secondary | ICD-10-CM | POA: Diagnosis not present

## 2014-09-30 DIAGNOSIS — L97511 Non-pressure chronic ulcer of other part of right foot limited to breakdown of skin: Secondary | ICD-10-CM | POA: Diagnosis not present

## 2014-09-30 DIAGNOSIS — A419 Sepsis, unspecified organism: Secondary | ICD-10-CM | POA: Diagnosis not present

## 2014-09-30 DIAGNOSIS — N529 Male erectile dysfunction, unspecified: Secondary | ICD-10-CM | POA: Diagnosis not present

## 2014-09-30 LAB — WOUND CULTURE: Gram Stain: NONE SEEN

## 2014-10-01 LAB — CULTURE, BLOOD (ROUTINE X 2)
Culture: NO GROWTH
Culture: NO GROWTH

## 2014-10-03 DIAGNOSIS — Z794 Long term (current) use of insulin: Secondary | ICD-10-CM | POA: Diagnosis not present

## 2014-10-03 DIAGNOSIS — N183 Chronic kidney disease, stage 3 (moderate): Secondary | ICD-10-CM | POA: Diagnosis not present

## 2014-10-03 DIAGNOSIS — E1142 Type 2 diabetes mellitus with diabetic polyneuropathy: Secondary | ICD-10-CM | POA: Diagnosis not present

## 2014-10-03 DIAGNOSIS — E1122 Type 2 diabetes mellitus with diabetic chronic kidney disease: Secondary | ICD-10-CM | POA: Diagnosis not present

## 2014-10-18 DIAGNOSIS — E1142 Type 2 diabetes mellitus with diabetic polyneuropathy: Secondary | ICD-10-CM | POA: Diagnosis not present

## 2014-10-18 DIAGNOSIS — L97511 Non-pressure chronic ulcer of other part of right foot limited to breakdown of skin: Secondary | ICD-10-CM | POA: Diagnosis not present

## 2014-10-25 ENCOUNTER — Other Ambulatory Visit: Payer: Self-pay | Admitting: *Deleted

## 2014-10-25 DIAGNOSIS — L97511 Non-pressure chronic ulcer of other part of right foot limited to breakdown of skin: Secondary | ICD-10-CM | POA: Diagnosis not present

## 2014-10-25 DIAGNOSIS — E1142 Type 2 diabetes mellitus with diabetic polyneuropathy: Secondary | ICD-10-CM | POA: Diagnosis not present

## 2014-10-25 MED ORDER — CARVEDILOL 25 MG PO TABS: 37.5000 mg | ORAL_TABLET | Freq: Two times a day (BID) | ORAL | Status: DC

## 2014-10-26 DIAGNOSIS — E780 Pure hypercholesterolemia: Secondary | ICD-10-CM | POA: Diagnosis not present

## 2014-10-26 DIAGNOSIS — Z720 Tobacco use: Secondary | ICD-10-CM | POA: Diagnosis not present

## 2014-10-26 DIAGNOSIS — I10 Essential (primary) hypertension: Secondary | ICD-10-CM | POA: Diagnosis not present

## 2014-10-26 DIAGNOSIS — K219 Gastro-esophageal reflux disease without esophagitis: Secondary | ICD-10-CM | POA: Diagnosis not present

## 2014-10-26 DIAGNOSIS — E119 Type 2 diabetes mellitus without complications: Secondary | ICD-10-CM | POA: Diagnosis not present

## 2014-10-26 DIAGNOSIS — N189 Chronic kidney disease, unspecified: Secondary | ICD-10-CM | POA: Diagnosis not present

## 2014-11-01 DIAGNOSIS — L97511 Non-pressure chronic ulcer of other part of right foot limited to breakdown of skin: Secondary | ICD-10-CM | POA: Diagnosis not present

## 2014-11-01 DIAGNOSIS — E1142 Type 2 diabetes mellitus with diabetic polyneuropathy: Secondary | ICD-10-CM | POA: Diagnosis not present

## 2014-11-08 ENCOUNTER — Ambulatory Visit (INDEPENDENT_AMBULATORY_CARE_PROVIDER_SITE_OTHER): Payer: Medicare Other | Admitting: Cardiology

## 2014-11-08 ENCOUNTER — Encounter: Payer: Self-pay | Admitting: Cardiology

## 2014-11-08 VITALS — BP 137/67 | HR 58 | Ht 75.0 in | Wt 249.8 lb

## 2014-11-08 DIAGNOSIS — I10 Essential (primary) hypertension: Secondary | ICD-10-CM

## 2014-11-08 DIAGNOSIS — E785 Hyperlipidemia, unspecified: Secondary | ICD-10-CM | POA: Diagnosis not present

## 2014-11-08 DIAGNOSIS — I2510 Atherosclerotic heart disease of native coronary artery without angina pectoris: Secondary | ICD-10-CM

## 2014-11-08 NOTE — Patient Instructions (Signed)

## 2014-11-08 NOTE — Progress Notes (Signed)
Clinical Summary Mr. Barwick is a 64 y.o.male seen today for follow up of the following medical problems.   1. CAD  - prior stent to RCA in 1999. Last cath 05/2001 : LM 20-30%, LAD 50-60% And 60-70% mid, LCX with mid AV portion 60-70%, RCA with patent stent with proximal 40-50% disease. LVEF by LVgram 65%, LVEDP 20. Overall moderate lesions, not great anatomically for PCI per notes, treated medically.   - Since last visit he denies any chest pain, no SOB or DOE.  - he remains compliant with his meds   2. Hyperlipidemia  - compliant with crestor  - last lipid panel 12/2013 TC 115 TG 122 HDL 37 LDL 54   3. HTN  - checks at home occasionally, typically around 120s/60s - compliant with meds  - recent admit with diabetic foot infection, had AKI at the time and ACE-I held initially but restarted at discharge  4. COPD - followed by pcp  5. CKD - followed by nephrology    Past Medical History  Diagnosis Date  . CAD (coronary artery disease)     multivessel  . Two-vessel coronary artery disease     moderate. by cath in 2003. Status post stenting of the mdi RCA November 1999 normal left ventricular ejection fraction  . Dyslipidemia   . DM2 (diabetes mellitus, type 2)   . HTN (hypertension)   . COPD (chronic obstructive pulmonary disease)     with ongoing tobacco use and patient failed sham takes  . Morbid obesity   . 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 Chronic renal insufficiency   . Reflux esophagitis     hx  . 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      No Known Allergies   Current Outpatient Prescriptions  Medication Sig Dispense Refill  . albuterol (VENTOLIN HFA) 108 (90 BASE) MCG/ACT inhaler Inhale 2 puffs into the lungs every 6 (six) hours as needed.    Marland Kitchen amLODipine (NORVASC) 10 MG tablet Take 10 mg by mouth daily.      Marland Kitchen aspirin 81  MG tablet Take 81 mg by mouth daily.      . Canagliflozin (INVOKANA) 100 MG TABS Take 1 tablet by mouth daily.    . carvedilol (COREG) 25 MG tablet Take 1.5 tablets (37.5 mg total) by mouth 2 (two) times daily with a meal. 90 tablet 3  . cholecalciferol (VITAMIN D) 1000 UNITS tablet Take 1,000 Units by mouth daily.    . clonazePAM (KLONOPIN) 0.5 MG tablet Take 0.5 mg by mouth daily.     Marland Kitchen doxycycline (VIBRAMYCIN) 50 MG capsule Take 2 capsules (100 mg total) by mouth 2 (two) times daily. 14 capsule 0  . hydrochlorothiazide (HYDRODIURIL) 25 MG tablet Take 1 tablet (25 mg total) by mouth daily. 25 tablet 6  . insulin glargine (LANTUS) 100 UNIT/ML injection Inject 20 Units into the skin every morning.     . Liraglutide (VICTOZA) 18 MG/3ML SOLN Inject 1.8 mg into the skin at bedtime.     . Multiple Vitamin (MULTIVITAMIN) tablet Take 1 tablet by mouth daily.      Marland Kitchen omeprazole (PRILOSEC) 20 MG capsule Take 20 mg by mouth daily.      . ramipril (ALTACE) 10 MG tablet Take 10 mg by mouth daily.      . rosuvastatin (CRESTOR) 20 MG tablet Take  1 tablet (20 mg total) by mouth daily. 30 tablet 6   No current facility-administered medications for this visit.     Past Surgical History  Procedure Laterality Date  . Back surgery    . Abdominal surgery    . Diabetic ulcers       No Known Allergies    Family History  Problem Relation Age of Onset  . Diabetes Mother   . Alcohol abuse Father      Social History Mr. Maton reports that he has been smoking Cigarettes.  He started smoking about 46 years ago. He has a 40 pack-year smoking history. He has never used smokeless tobacco. Mr. Callais reports that he does not drink alcohol.   Review of Systems CONSTITUTIONAL: No weight loss, fever, chills, weakness or fatigue.  HEENT: Eyes: No visual loss, blurred vision, double vision or yellow sclerae.No hearing loss, sneezing, congestion, runny nose or sore throat.  SKIN: No rash or itching.    CARDIOVASCULAR: per HPI RESPIRATORY: No shortness of breath, cough or sputum.  GASTROINTESTINAL: No anorexia, nausea, vomiting or diarrhea. No abdominal pain or blood.  GENITOURINARY: No burning on urination, no polyuria NEUROLOGICAL: No headache, dizziness, syncope, paralysis, ataxia, numbness or tingling in the extremities. No change in bowel or bladder control.  MUSCULOSKELETAL: No muscle, back pain, joint pain or stiffness.  LYMPHATICS: No enlarged nodes. No history of splenectomy.  PSYCHIATRIC: No history of depression or anxiety.  ENDOCRINOLOGIC: No reports of sweating, cold or heat intolerance. No polyuria or polydipsia.  Marland Kitchen   Physical Examination Filed Vitals:   11/08/14 1303  BP: 137/67  Pulse: 58   Filed Vitals:   11/08/14 1303  Height: 6\' 3"  (1.905 m)  Weight: 249 lb 12.8 oz (113.309 kg)    Gen: resting comfortably, no acute distress HEENT: no scleral icterus, pupils equal round and reactive, no palptable cervical adenopathy,  CV: RRR, no m/r/g, no JVD Resp: Clear to auscultation bilaterally GI: abdomen is soft, non-tender, non-distended, normal bowel sounds, no hepatosplenomegaly MSK: extremities are warm, no edema.  Skin: warm, no rash Neuro:  no focal deficits Psych: appropriate affect   Diagnostic Studies  05/2001 Cath  FINDINGS:  1. Left main trunk: Medium caliber vessel. This is a long vessel with a  distal taper of 20-30%.  2. Left anterior descending artery: This is a medium caliber vessel that  provides a large bifurcating first diagonal Kayal Mula in the proximal segment  and before then extending to the apex, the LAD tapers significantly after  the diagonal Graceson Nichelson. There is moderate disease of 50-60% after the  diagonal Krisinda Giovanni and then a focal eccentric narrowing of 60-70% in the mid  section of the LAD. The diagonal Yanelie Abraha has moderate disease of 30-40% in  the proximal segment. The lateral division is a small caliber vessel with  an  ostial narrowing of 60%.  3. Left circumflex artery: This is a small caliber vessel that provides a  trivial first marginal Nyeli Holtmeyer in the mid section and a medium caliber  second marginal Timara Loma distally. The mid AV circumflex at the takeoff of  the first and second diagonal branches had moderate diffuse disease of  60-70%.  4. Right coronary artery: Dominant. This is a large caliber vessel that  provides a posterior descending artery and two posteroventricular branches  in the terminal segment. The right coronary artery has evidence of an  existing stent in the mid section that is widely patent. Prior to stent is  a focal  discrete narrowing of 40-50%. Distal to the stent is moderate  disease of 30%. The distal Viliami Bracco vessels also have mild disease of 30%.  5. Left ventricle: Normal end-systolic and end-diastolic dimensions. Overall  left ventricular function is well preserved. Ejection fraction is greater  than 65%. No mitral regurgitation. LV pressure is 180/10, aortic is  180/90. LV EDP equals 20.  ASSESSMENT AND PLAN: Mr. Irvan is a 64 year old gentleman with moderate  three-vessel coronary artery disease. The patients plaque burden in the left  anterior descending artery and circumflex appears to have increased somewhat  compared to his previous angiogram. Unfortunately, the mid and distal left  anterior descending artery is a small caliber vessel with a long diffuse  segment of disease and would be associated with a high restenosis rate.  Similarly, the circumflex would suffer the same fate.  Further assessment with a stress imaging study would be prudent to isolate the  area of ischemia. Percutaneous intervention may then be targeted if  indicated. In addition, aggressive medical therapy should be pursued as the  patient currently is not on any anti-anginal therapy and has poorly controlled  hypertension. The patient will also be counseled regarding  smoking cessation.    07/2013 PFTs Mild obstruction    Assessment and Plan  1. CAD  - no current symptoms - clinic EKG shows new T-wave inversions in inferior and lateral leads. Concern for silent ischemia given these changes and his history of DM2. Has been several years since he has had an ischemic evaluation, will arrange exercise cardiolite. Will need to hold coreg day of test.   2. Hyperlipidemia  - continue crestor, lipids are at goal  3. HTN  - at goal, continue current meds    F/u 6 months  Arnoldo Lenis, M.D.

## 2014-11-09 ENCOUNTER — Encounter: Payer: Self-pay | Admitting: *Deleted

## 2014-11-09 ENCOUNTER — Telehealth: Payer: Self-pay | Admitting: *Deleted

## 2014-11-09 DIAGNOSIS — I2510 Atherosclerotic heart disease of native coronary artery without angina pectoris: Secondary | ICD-10-CM

## 2014-11-09 NOTE — Telephone Encounter (Signed)
Pt aware and voiced understanding, will forward to schedulers, place order, and mail instruction letter regarding holding coreg.

## 2014-11-09 NOTE — Telephone Encounter (Signed)
-----   Message from Arnoldo Lenis, MD sent at 11/09/2014  2:39 PM EDT ----- Please arrange an exercise cardiolite for patient for CAD. He will need to hold his coreg the day of the test. Next week is fine for test  Zandra Abts MD

## 2014-11-14 ENCOUNTER — Other Ambulatory Visit: Payer: Self-pay

## 2014-11-15 DIAGNOSIS — L97511 Non-pressure chronic ulcer of other part of right foot limited to breakdown of skin: Secondary | ICD-10-CM | POA: Diagnosis not present

## 2014-11-15 DIAGNOSIS — E1142 Type 2 diabetes mellitus with diabetic polyneuropathy: Secondary | ICD-10-CM | POA: Diagnosis not present

## 2014-11-16 ENCOUNTER — Encounter (HOSPITAL_COMMUNITY)
Admission: RE | Admit: 2014-11-16 | Discharge: 2014-11-16 | Disposition: A | Discharge status: Home / Self Care | Payer: Medicare Other | Source: Ambulatory Visit | Attending: Cardiology | Admitting: Cardiology

## 2014-11-16 ENCOUNTER — Ambulatory Visit (HOSPITAL_COMMUNITY): Admission: RE | Admit: 2014-11-16 | Payer: PRIVATE HEALTH INSURANCE | Source: Ambulatory Visit

## 2014-11-16 ENCOUNTER — Encounter (HOSPITAL_COMMUNITY): Payer: Self-pay

## 2014-11-16 DIAGNOSIS — I2510 Atherosclerotic heart disease of native coronary artery without angina pectoris: Secondary | ICD-10-CM | POA: Diagnosis not present

## 2014-11-16 HISTORY — DX: Disorder of kidney and ureter, unspecified: N28.9

## 2014-11-16 LAB — NM MYOCAR MULTI W/SPECT W/WALL MOTION / EF
Estimated workload: 7 METS
Exercise duration (min): 5 min
Exercise duration (sec): 35 s
LV dias vol: 111 mL
LV sys vol: 47 mL
MPHR: 159 {beats}/min
Peak HR: 106 {beats}/min
Percent HR: 66 %
RPE: 13
Rest HR: 57 {beats}/min
SDS: 2
SRS: 4
SSS: 6
TID: 1.24

## 2014-11-16 MED ORDER — TECHNETIUM TC 99M SESTAMIBI - CARDIOLITE
10.0000 | Freq: Once | INTRAVENOUS | Status: AC | PRN
  Administered 2014-11-16: 08:00:00 9 via INTRAVENOUS

## 2014-11-16 MED ORDER — REGADENOSON 0.4 MG/5ML IV SOLN
INTRAVENOUS | Status: AC
  Administered 2014-11-16: 0.4 mg via INTRAVENOUS
  Filled 2014-11-16: qty 5

## 2014-11-16 MED ORDER — TECHNETIUM TC 99M SESTAMIBI GENERIC - CARDIOLITE
30.0000 | Freq: Once | INTRAVENOUS | Status: AC | PRN
  Administered 2014-11-16: 32 via INTRAVENOUS

## 2014-11-16 MED ORDER — SODIUM CHLORIDE 0.9 % IJ SOLN
INTRAMUSCULAR | Status: AC
  Administered 2014-11-16: 10 mL via INTRAVENOUS
  Filled 2014-11-16: qty 3

## 2014-11-17 ENCOUNTER — Telehealth: Payer: Self-pay | Admitting: *Deleted

## 2014-11-17 NOTE — Telephone Encounter (Signed)
Pt aware, routed to pcp 

## 2014-11-17 NOTE — Telephone Encounter (Signed)
-----   Message from Arnoldo Lenis, MD sent at 11/17/2014  1:02 PM EDT ----- Stress test overall looks good. There is only a very small area that was slightly abnormal, but overall the test is low risk. We will continue current meds  Zandra Abts MD

## 2014-11-25 ENCOUNTER — Other Ambulatory Visit: Payer: Self-pay | Admitting: *Deleted

## 2014-11-25 MED ORDER — ROSUVASTATIN CALCIUM 20 MG PO TABS: 20.0000 mg | ORAL_TABLET | Freq: Every day | ORAL | Status: DC

## 2014-11-29 DIAGNOSIS — E1142 Type 2 diabetes mellitus with diabetic polyneuropathy: Secondary | ICD-10-CM | POA: Diagnosis not present

## 2014-11-29 DIAGNOSIS — L97521 Non-pressure chronic ulcer of other part of left foot limited to breakdown of skin: Secondary | ICD-10-CM | POA: Diagnosis not present

## 2014-11-29 DIAGNOSIS — L97511 Non-pressure chronic ulcer of other part of right foot limited to breakdown of skin: Secondary | ICD-10-CM | POA: Diagnosis not present

## 2014-12-13 DIAGNOSIS — L97511 Non-pressure chronic ulcer of other part of right foot limited to breakdown of skin: Secondary | ICD-10-CM | POA: Diagnosis not present

## 2014-12-13 DIAGNOSIS — L97521 Non-pressure chronic ulcer of other part of left foot limited to breakdown of skin: Secondary | ICD-10-CM | POA: Diagnosis not present

## 2014-12-27 DIAGNOSIS — L97521 Non-pressure chronic ulcer of other part of left foot limited to breakdown of skin: Secondary | ICD-10-CM | POA: Diagnosis not present

## 2014-12-27 DIAGNOSIS — L97511 Non-pressure chronic ulcer of other part of right foot limited to breakdown of skin: Secondary | ICD-10-CM | POA: Diagnosis not present

## 2015-01-05 DIAGNOSIS — N183 Chronic kidney disease, stage 3 (moderate): Secondary | ICD-10-CM | POA: Diagnosis not present

## 2015-01-05 DIAGNOSIS — D649 Anemia, unspecified: Secondary | ICD-10-CM | POA: Diagnosis not present

## 2015-01-05 DIAGNOSIS — R809 Proteinuria, unspecified: Secondary | ICD-10-CM | POA: Diagnosis not present

## 2015-01-05 DIAGNOSIS — Z79899 Other long term (current) drug therapy: Secondary | ICD-10-CM | POA: Diagnosis not present

## 2015-01-05 DIAGNOSIS — D509 Iron deficiency anemia, unspecified: Secondary | ICD-10-CM | POA: Diagnosis not present

## 2015-01-05 DIAGNOSIS — E559 Vitamin D deficiency, unspecified: Secondary | ICD-10-CM | POA: Diagnosis not present

## 2015-01-05 DIAGNOSIS — I10 Essential (primary) hypertension: Secondary | ICD-10-CM | POA: Diagnosis not present

## 2015-01-10 DIAGNOSIS — L97511 Non-pressure chronic ulcer of other part of right foot limited to breakdown of skin: Secondary | ICD-10-CM | POA: Diagnosis not present

## 2015-01-10 DIAGNOSIS — E1342 Other specified diabetes mellitus with diabetic polyneuropathy: Secondary | ICD-10-CM | POA: Diagnosis not present

## 2015-01-10 DIAGNOSIS — L97521 Non-pressure chronic ulcer of other part of left foot limited to breakdown of skin: Secondary | ICD-10-CM | POA: Diagnosis not present

## 2015-01-11 DIAGNOSIS — E559 Vitamin D deficiency, unspecified: Secondary | ICD-10-CM | POA: Diagnosis not present

## 2015-01-11 DIAGNOSIS — I10 Essential (primary) hypertension: Secondary | ICD-10-CM | POA: Diagnosis not present

## 2015-01-11 DIAGNOSIS — E1129 Type 2 diabetes mellitus with other diabetic kidney complication: Secondary | ICD-10-CM | POA: Diagnosis not present

## 2015-01-11 DIAGNOSIS — E669 Obesity, unspecified: Secondary | ICD-10-CM | POA: Diagnosis not present

## 2015-01-11 DIAGNOSIS — N184 Chronic kidney disease, stage 4 (severe): Secondary | ICD-10-CM | POA: Diagnosis not present

## 2015-01-11 DIAGNOSIS — N183 Chronic kidney disease, stage 3 (moderate): Secondary | ICD-10-CM | POA: Diagnosis not present

## 2015-01-11 DIAGNOSIS — Z716 Tobacco abuse counseling: Secondary | ICD-10-CM | POA: Diagnosis not present

## 2015-01-11 DIAGNOSIS — R809 Proteinuria, unspecified: Secondary | ICD-10-CM | POA: Diagnosis not present

## 2015-01-19 DIAGNOSIS — E119 Type 2 diabetes mellitus without complications: Secondary | ICD-10-CM | POA: Diagnosis not present

## 2015-01-24 DIAGNOSIS — L97521 Non-pressure chronic ulcer of other part of left foot limited to breakdown of skin: Secondary | ICD-10-CM | POA: Diagnosis not present

## 2015-01-24 DIAGNOSIS — E1342 Other specified diabetes mellitus with diabetic polyneuropathy: Secondary | ICD-10-CM | POA: Diagnosis not present

## 2015-01-24 DIAGNOSIS — L97511 Non-pressure chronic ulcer of other part of right foot limited to breakdown of skin: Secondary | ICD-10-CM | POA: Diagnosis not present

## 2015-01-31 DIAGNOSIS — L97521 Non-pressure chronic ulcer of other part of left foot limited to breakdown of skin: Secondary | ICD-10-CM | POA: Diagnosis not present

## 2015-01-31 DIAGNOSIS — L97511 Non-pressure chronic ulcer of other part of right foot limited to breakdown of skin: Secondary | ICD-10-CM | POA: Diagnosis not present

## 2015-01-31 DIAGNOSIS — E1342 Other specified diabetes mellitus with diabetic polyneuropathy: Secondary | ICD-10-CM | POA: Diagnosis not present

## 2015-02-07 DIAGNOSIS — L97521 Non-pressure chronic ulcer of other part of left foot limited to breakdown of skin: Secondary | ICD-10-CM | POA: Diagnosis not present

## 2015-02-07 DIAGNOSIS — E1342 Other specified diabetes mellitus with diabetic polyneuropathy: Secondary | ICD-10-CM | POA: Diagnosis not present

## 2015-02-07 DIAGNOSIS — L97511 Non-pressure chronic ulcer of other part of right foot limited to breakdown of skin: Secondary | ICD-10-CM | POA: Diagnosis not present

## 2015-02-21 DIAGNOSIS — L97511 Non-pressure chronic ulcer of other part of right foot limited to breakdown of skin: Secondary | ICD-10-CM | POA: Diagnosis not present

## 2015-02-21 DIAGNOSIS — E1142 Type 2 diabetes mellitus with diabetic polyneuropathy: Secondary | ICD-10-CM | POA: Diagnosis not present

## 2015-02-21 DIAGNOSIS — L97521 Non-pressure chronic ulcer of other part of left foot limited to breakdown of skin: Secondary | ICD-10-CM | POA: Diagnosis not present

## 2015-02-28 DIAGNOSIS — L97511 Non-pressure chronic ulcer of other part of right foot limited to breakdown of skin: Secondary | ICD-10-CM | POA: Diagnosis not present

## 2015-02-28 DIAGNOSIS — L97521 Non-pressure chronic ulcer of other part of left foot limited to breakdown of skin: Secondary | ICD-10-CM | POA: Diagnosis not present

## 2015-02-28 DIAGNOSIS — E1142 Type 2 diabetes mellitus with diabetic polyneuropathy: Secondary | ICD-10-CM | POA: Diagnosis not present

## 2015-03-06 ENCOUNTER — Other Ambulatory Visit: Payer: Self-pay

## 2015-03-06 MED ORDER — CARVEDILOL 25 MG PO TABS: 37.5000 mg | ORAL_TABLET | Freq: Two times a day (BID) | ORAL | Status: DC

## 2015-03-07 DIAGNOSIS — L97521 Non-pressure chronic ulcer of other part of left foot limited to breakdown of skin: Secondary | ICD-10-CM | POA: Diagnosis not present

## 2015-03-07 DIAGNOSIS — L97511 Non-pressure chronic ulcer of other part of right foot limited to breakdown of skin: Secondary | ICD-10-CM | POA: Diagnosis not present

## 2015-03-07 DIAGNOSIS — E1142 Type 2 diabetes mellitus with diabetic polyneuropathy: Secondary | ICD-10-CM | POA: Diagnosis not present

## 2015-03-14 DIAGNOSIS — L97511 Non-pressure chronic ulcer of other part of right foot limited to breakdown of skin: Secondary | ICD-10-CM | POA: Diagnosis not present

## 2015-03-14 DIAGNOSIS — L97521 Non-pressure chronic ulcer of other part of left foot limited to breakdown of skin: Secondary | ICD-10-CM | POA: Diagnosis not present

## 2015-03-14 DIAGNOSIS — E1142 Type 2 diabetes mellitus with diabetic polyneuropathy: Secondary | ICD-10-CM | POA: Diagnosis not present

## 2015-03-17 DIAGNOSIS — E1142 Type 2 diabetes mellitus with diabetic polyneuropathy: Secondary | ICD-10-CM | POA: Diagnosis not present

## 2015-03-17 DIAGNOSIS — S90425A Blister (nonthermal), left lesser toe(s), initial encounter: Secondary | ICD-10-CM | POA: Diagnosis not present

## 2015-03-21 DIAGNOSIS — L97511 Non-pressure chronic ulcer of other part of right foot limited to breakdown of skin: Secondary | ICD-10-CM | POA: Diagnosis not present

## 2015-03-21 DIAGNOSIS — E1142 Type 2 diabetes mellitus with diabetic polyneuropathy: Secondary | ICD-10-CM | POA: Diagnosis not present

## 2015-03-21 DIAGNOSIS — L97521 Non-pressure chronic ulcer of other part of left foot limited to breakdown of skin: Secondary | ICD-10-CM | POA: Diagnosis not present

## 2015-03-28 DIAGNOSIS — E1142 Type 2 diabetes mellitus with diabetic polyneuropathy: Secondary | ICD-10-CM | POA: Diagnosis not present

## 2015-03-28 DIAGNOSIS — L97521 Non-pressure chronic ulcer of other part of left foot limited to breakdown of skin: Secondary | ICD-10-CM | POA: Diagnosis not present

## 2015-04-04 DIAGNOSIS — E1142 Type 2 diabetes mellitus with diabetic polyneuropathy: Secondary | ICD-10-CM | POA: Diagnosis not present

## 2015-04-04 DIAGNOSIS — L97521 Non-pressure chronic ulcer of other part of left foot limited to breakdown of skin: Secondary | ICD-10-CM | POA: Diagnosis not present

## 2015-04-11 DIAGNOSIS — E1142 Type 2 diabetes mellitus with diabetic polyneuropathy: Secondary | ICD-10-CM | POA: Diagnosis not present

## 2015-04-11 DIAGNOSIS — L97521 Non-pressure chronic ulcer of other part of left foot limited to breakdown of skin: Secondary | ICD-10-CM | POA: Diagnosis not present

## 2015-04-12 DIAGNOSIS — E1122 Type 2 diabetes mellitus with diabetic chronic kidney disease: Secondary | ICD-10-CM | POA: Diagnosis not present

## 2015-04-12 DIAGNOSIS — E11621 Type 2 diabetes mellitus with foot ulcer: Secondary | ICD-10-CM | POA: Diagnosis not present

## 2015-04-12 DIAGNOSIS — E1142 Type 2 diabetes mellitus with diabetic polyneuropathy: Secondary | ICD-10-CM | POA: Diagnosis not present

## 2015-04-12 DIAGNOSIS — Z794 Long term (current) use of insulin: Secondary | ICD-10-CM | POA: Diagnosis not present

## 2015-04-12 DIAGNOSIS — Z7984 Long term (current) use of oral hypoglycemic drugs: Secondary | ICD-10-CM | POA: Diagnosis not present

## 2015-04-12 DIAGNOSIS — Z23 Encounter for immunization: Secondary | ICD-10-CM | POA: Diagnosis not present

## 2015-04-12 DIAGNOSIS — Z89429 Acquired absence of other toe(s), unspecified side: Secondary | ICD-10-CM | POA: Diagnosis not present

## 2015-04-12 DIAGNOSIS — N183 Chronic kidney disease, stage 3 (moderate): Secondary | ICD-10-CM | POA: Diagnosis not present

## 2015-04-18 DIAGNOSIS — E1142 Type 2 diabetes mellitus with diabetic polyneuropathy: Secondary | ICD-10-CM | POA: Diagnosis not present

## 2015-04-18 DIAGNOSIS — L97521 Non-pressure chronic ulcer of other part of left foot limited to breakdown of skin: Secondary | ICD-10-CM | POA: Diagnosis not present

## 2015-04-25 DIAGNOSIS — L97521 Non-pressure chronic ulcer of other part of left foot limited to breakdown of skin: Secondary | ICD-10-CM | POA: Diagnosis not present

## 2015-04-25 DIAGNOSIS — N183 Chronic kidney disease, stage 3 (moderate): Secondary | ICD-10-CM | POA: Diagnosis not present

## 2015-04-25 DIAGNOSIS — E559 Vitamin D deficiency, unspecified: Secondary | ICD-10-CM | POA: Diagnosis not present

## 2015-04-25 DIAGNOSIS — E1142 Type 2 diabetes mellitus with diabetic polyneuropathy: Secondary | ICD-10-CM | POA: Diagnosis not present

## 2015-04-25 DIAGNOSIS — I10 Essential (primary) hypertension: Secondary | ICD-10-CM | POA: Diagnosis not present

## 2015-04-25 DIAGNOSIS — D649 Anemia, unspecified: Secondary | ICD-10-CM | POA: Diagnosis not present

## 2015-04-25 DIAGNOSIS — Z79899 Other long term (current) drug therapy: Secondary | ICD-10-CM | POA: Diagnosis not present

## 2015-04-25 DIAGNOSIS — R809 Proteinuria, unspecified: Secondary | ICD-10-CM | POA: Diagnosis not present

## 2015-04-27 DIAGNOSIS — I10 Essential (primary) hypertension: Secondary | ICD-10-CM | POA: Diagnosis not present

## 2015-04-27 DIAGNOSIS — E119 Type 2 diabetes mellitus without complications: Secondary | ICD-10-CM | POA: Diagnosis not present

## 2015-04-27 DIAGNOSIS — K219 Gastro-esophageal reflux disease without esophagitis: Secondary | ICD-10-CM | POA: Diagnosis not present

## 2015-04-27 DIAGNOSIS — N189 Chronic kidney disease, unspecified: Secondary | ICD-10-CM | POA: Diagnosis not present

## 2015-05-02 DIAGNOSIS — E1142 Type 2 diabetes mellitus with diabetic polyneuropathy: Secondary | ICD-10-CM | POA: Diagnosis not present

## 2015-05-02 DIAGNOSIS — L97521 Non-pressure chronic ulcer of other part of left foot limited to breakdown of skin: Secondary | ICD-10-CM | POA: Diagnosis not present

## 2015-05-03 DIAGNOSIS — E1129 Type 2 diabetes mellitus with other diabetic kidney complication: Secondary | ICD-10-CM | POA: Diagnosis not present

## 2015-05-03 DIAGNOSIS — R809 Proteinuria, unspecified: Secondary | ICD-10-CM | POA: Diagnosis not present

## 2015-05-03 DIAGNOSIS — E669 Obesity, unspecified: Secondary | ICD-10-CM | POA: Diagnosis not present

## 2015-05-03 DIAGNOSIS — Z716 Tobacco abuse counseling: Secondary | ICD-10-CM | POA: Diagnosis not present

## 2015-05-03 DIAGNOSIS — E559 Vitamin D deficiency, unspecified: Secondary | ICD-10-CM | POA: Diagnosis not present

## 2015-05-03 DIAGNOSIS — I10 Essential (primary) hypertension: Secondary | ICD-10-CM | POA: Diagnosis not present

## 2015-05-03 DIAGNOSIS — N184 Chronic kidney disease, stage 4 (severe): Secondary | ICD-10-CM | POA: Diagnosis not present

## 2015-05-03 DIAGNOSIS — N183 Chronic kidney disease, stage 3 (moderate): Secondary | ICD-10-CM | POA: Diagnosis not present

## 2015-05-09 DIAGNOSIS — L97521 Non-pressure chronic ulcer of other part of left foot limited to breakdown of skin: Secondary | ICD-10-CM | POA: Diagnosis not present

## 2015-05-09 DIAGNOSIS — L03119 Cellulitis of unspecified part of limb: Secondary | ICD-10-CM | POA: Diagnosis not present

## 2015-05-09 DIAGNOSIS — E1142 Type 2 diabetes mellitus with diabetic polyneuropathy: Secondary | ICD-10-CM | POA: Diagnosis not present

## 2015-05-23 DIAGNOSIS — E1142 Type 2 diabetes mellitus with diabetic polyneuropathy: Secondary | ICD-10-CM | POA: Diagnosis not present

## 2015-05-23 DIAGNOSIS — L97521 Non-pressure chronic ulcer of other part of left foot limited to breakdown of skin: Secondary | ICD-10-CM | POA: Diagnosis not present

## 2015-05-29 ENCOUNTER — Encounter: Payer: Self-pay | Admitting: *Deleted

## 2015-05-29 ENCOUNTER — Ambulatory Visit (INDEPENDENT_AMBULATORY_CARE_PROVIDER_SITE_OTHER): Payer: Medicare Other | Admitting: Cardiology

## 2015-05-29 ENCOUNTER — Encounter: Payer: Self-pay | Admitting: Cardiology

## 2015-05-29 VITALS — BP 192/88 | HR 55 | Ht 75.0 in | Wt 252.8 lb

## 2015-05-29 DIAGNOSIS — I10 Essential (primary) hypertension: Secondary | ICD-10-CM | POA: Diagnosis not present

## 2015-05-29 DIAGNOSIS — E785 Hyperlipidemia, unspecified: Secondary | ICD-10-CM

## 2015-05-29 DIAGNOSIS — I2510 Atherosclerotic heart disease of native coronary artery without angina pectoris: Secondary | ICD-10-CM | POA: Diagnosis not present

## 2015-05-29 NOTE — Patient Instructions (Signed)
Your physician wants you to follow-up in: 6 months with Dr. Bryna Colander will receive a reminder letter in the mail two months in advance. If you don't receive a letter, please call our office to schedule the follow-up appointment.  Your physician recommends that you continue on your current medications as directed. Please refer to the Current Medication list given to you today.  Your physician has requested that you regularly monitor and record your blood pressure readings at home FOR 1 WEEK AND CALL us WITH RESULTS. Please use the same machine at the same time of day to check your readings and record them to bring to your follow-up visit.  Thank you for choosing [facility]!!

## 2015-05-29 NOTE — Progress Notes (Signed)
Patient ID: [name], male   DOB: 11-07-1950, 65 y.o.   MRN: [id number]     Clinical Summary [name] is a 65 y.o.male seen today for follow up of the following medical problems.   1. CAD  - prior stent to RCA in 1999. Last cath 05/2001 : LM 20-30%, LAD 50-60% And 60-70% mid, LCX with mid AV portion 60-70%, RCA with patent stent with proximal 40-50% disease. LVEF by LVgram 65%, LVEDP 20. Overall moderate lesions, not great anatomically for PCI per notes, treated medically.  - 10/2014 Lexiscan with small area of ischemia mid inferior to apical, overall low risk  - chest congestion over the last few days, + productive cough with some associcated chest pain. Has not had any other chest pain symptoms. No SOB or DOE - compliant with meds  2. Hyperlipidemia  - compliant with crestor  - Last lipid panel 04/2014 TC 120 TG 121 HDL 42 LDL 54  3. HTN  - checks at home occasionally, typically around 130s/80s - compliant with meds    4. COPD - followed by pcp  5. CKD - followed by nephrology Past Medical History  Diagnosis Date  . CAD (coronary artery disease)     multivessel  . Two-vessel coronary artery disease     moderate. by cath in 2003. Status post stenting of the mdi RCA November 1999 normal left ventricular ejection fraction  . Dyslipidemia   . DM2 (diabetes mellitus, type 2)   . HTN (hypertension)   . COPD (chronic obstructive pulmonary disease)     with ongoing tobacco use and patient failed sham takes  . Morbid obesity   . 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 Chronic renal insufficiency   . Reflux esophagitis     hx  . 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   . Renal insufficiency      No Known Allergies   Current Outpatient Prescriptions  Medication Sig Dispense Refill  . albuterol (VENTOLIN HFA) 108 (90 BASE) MCG/ACT inhaler  Inhale 2 puffs into the lungs every 6 (six) hours as needed.    Marland Kitchen amLODipine (NORVASC) 10 MG tablet Take 10 mg by mouth daily.      Marland Kitchen aspirin 81 MG tablet Take 81 mg by mouth daily.      . Canagliflozin (INVOKANA) 100 MG TABS Take 1 tablet by mouth daily.    . carvedilol (COREG) 25 MG tablet Take 1.5 tablets (37.5 mg total) by mouth 2 (two) times daily with a meal. 90 tablet 3  . cholecalciferol (VITAMIN D) 1000 UNITS tablet Take 1,000 Units by mouth daily.    . clonazePAM (KLONOPIN) 0.5 MG tablet Take 0.5 mg by mouth daily.     . hydrochlorothiazide (HYDRODIURIL) 25 MG tablet Take 1 tablet (25 mg total) by mouth daily. 25 tablet 6  . insulin glargine (LANTUS) 100 UNIT/ML injection Inject 20 Units into the skin every morning.     . Liraglutide (VICTOZA) 18 MG/3ML SOLN Inject 1.8 mg into the skin at bedtime.     . Multiple Vitamin (MULTIVITAMIN) tablet Take 1 tablet by mouth daily.      Marland Kitchen omeprazole (PRILOSEC) 20 MG capsule Take 20 mg by mouth daily.      . ramipril (ALTACE) 10 MG tablet Take 10 mg by mouth daily.      . rosuvastatin (CRESTOR) 20 MG  tablet Take 1 tablet (20 mg total) by mouth daily. 30 tablet 6   No current facility-administered medications for this visit.     Past Surgical History  Procedure Laterality Date  . Back surgery    . Abdominal surgery    . Diabetic ulcers       No Known Allergies    Family History  Problem Relation Age of Onset  . Diabetes Mother   . Alcohol abuse Father      Social History [name] reports that he has been smoking Cigarettes.  He started smoking about 47 years ago. He has a 40 pack-year smoking history. He has never used smokeless tobacco. [name] reports that he does not drink alcohol.   Review of Systems CONSTITUTIONAL: No weight loss, fever, chills, weakness or fatigue.  HEENT: Eyes: No visual loss, blurred vision, double vision or yellow sclerae.No hearing loss, sneezing, congestion, runny nose or sore throat.  SKIN:  No rash or itching.  CARDIOVASCULAR: per HPI RESPIRATORY: No shortness of breath, cough or sputum.  GASTROINTESTINAL: No anorexia, nausea, vomiting or diarrhea. No abdominal pain or blood.  GENITOURINARY: No burning on urination, no polyuria NEUROLOGICAL: No headache, dizziness, syncope, paralysis, ataxia, numbness or tingling in the extremities. No change in bowel or bladder control.  MUSCULOSKELETAL: No muscle, back pain, joint pain or stiffness.  LYMPHATICS: No enlarged nodes. No history of splenectomy.  PSYCHIATRIC: No history of depression or anxiety.  ENDOCRINOLOGIC: No reports of sweating, cold or heat intolerance. No polyuria or polydipsia.  Marland Kitchen   Physical Examination Filed Vitals:   05/29/15 1225  BP: 192/88  Pulse: 55   Filed Vitals:   05/29/15 1225  Height: 6\' 3"  (1.905 m)  Weight: 252 lb 12.8 oz (114.669 kg)    Gen: resting comfortably, no acute distress HEENT: no scleral icterus, pupils equal round and reactive, no palptable cervical adenopathy,  CV: RRR, no m/r/g, no jvd Resp: Clear to auscultation bilaterally GI: abdomen is soft, non-tender, non-distended, normal bowel sounds, no hepatosplenomegaly MSK: extremities are warm, no edema.  Skin: warm, no rash Neuro:  no focal deficits Psych: appropriate affect   Diagnostic Studies 05/2001 Cath  FINDINGS:  1. Left main trunk: Medium caliber vessel. This is a long vessel with a  distal taper of 20-30%.  2. Left anterior descending artery: This is a medium caliber vessel that  provides a large bifurcating first diagonal Eugine Bubb in the proximal segment  and before then extending to the apex, the LAD tapers significantly after  the diagonal Caliya Narine. There is moderate disease of 50-60% after the  diagonal Murial Beam and then a focal eccentric narrowing of 60-70% in the mid  section of the LAD. The diagonal Dreamer Carillo has moderate disease of 30-40% in  the proximal segment. The lateral division is a small caliber  vessel with  an ostial narrowing of 60%.  3. Left circumflex artery: This is a small caliber vessel that provides a  trivial first marginal Jameela Michna in the mid section and a medium caliber  second marginal Jabez Molner distally. The mid AV circumflex at the takeoff of  the first and second diagonal branches had moderate diffuse disease of  60-70%.  4. Right coronary artery: Dominant. This is a large caliber vessel that  provides a posterior descending artery and two posteroventricular branches  in the terminal segment. The right coronary artery has evidence of an  existing stent in the mid section that is widely patent. Prior to stent is  a focal  discrete narrowing of 40-50%. Distal to the stent is moderate  disease of 30%. The distal Henya Aguallo vessels also have mild disease of 30%.  5. Left ventricle: Normal end-systolic and end-diastolic dimensions. Overall  left ventricular function is well preserved. Ejection fraction is greater  than 65%. No mitral regurgitation. LV pressure is 180/10, aortic is  180/90. LV EDP equals 20.  ASSESSMENT AND PLAN: [name] is a 65 year old gentleman with moderate  three-vessel coronary artery disease. The patients plaque burden in the left  anterior descending artery and circumflex appears to have increased somewhat  compared to his previous angiogram. Unfortunately, the mid and distal left  anterior descending artery is a small caliber vessel with a long diffuse  segment of disease and would be associated with a high restenosis rate.  Similarly, the circumflex would suffer the same fate.  Further assessment with a stress imaging study would be prudent to isolate the  area of ischemia. Percutaneous intervention may then be targeted if  indicated. In addition, aggressive medical therapy should be pursued as the  patient currently is not on any anti-anginal therapy and has poorly controlled  hypertension. The patient will also be  counseled regarding smoking cessation.    07/2013 PFTs Mild obstruction   10/2014 Lexiscan  Unable to adequately ambulate to treadmill due to right hip pain. Lexiscan employed.  Defect 1: There is a small defect of mild severity present in the mid inferior and apical inferior location. This defect is partially reversible.  This is a low risk study.  Nuclear stress EF: 58%.  Small region of apical inferior ischemia     Assessment and Plan  1. CAD  - no current symptoms, recent nuclear stress low risk with only small area of ischemia - continue secondary prevention and risk factor modification  2. Hyperlipidemia  - continue crestor, lipids are at goal  3. HTN  - elevated at clinic, home numbers are at goal per his report. He will submit a bp log in 1 week, if bp's above his goal of <130/80 based on his DM2 and CKD will need medication titration.   F/u 6 months      Arnoldo Lenis, M.D.,

## 2015-06-06 DIAGNOSIS — E1142 Type 2 diabetes mellitus with diabetic polyneuropathy: Secondary | ICD-10-CM | POA: Diagnosis not present

## 2015-06-06 DIAGNOSIS — R234 Changes in skin texture: Secondary | ICD-10-CM | POA: Diagnosis not present

## 2015-06-06 DIAGNOSIS — L97521 Non-pressure chronic ulcer of other part of left foot limited to breakdown of skin: Secondary | ICD-10-CM | POA: Diagnosis not present

## 2015-06-09 ENCOUNTER — Telehealth: Payer: Self-pay | Admitting: *Deleted

## 2015-06-09 ENCOUNTER — Encounter: Payer: Self-pay | Admitting: *Deleted

## 2015-06-09 MED ORDER — HYDRALAZINE HCL 25 MG PO TABS: 25.0000 mg | ORAL_TABLET | Freq: Three times a day (TID) | ORAL | Status: DC

## 2015-06-09 NOTE — Telephone Encounter (Signed)
BP is too high. Can we change his hydrochlorothiazide to chlorthalidone 25mg  daily He needs repeat BMET and Mg in 2 weeks, submit bp log again in 2 weeks.  Zandra Abts MD

## 2015-06-09 NOTE — Telephone Encounter (Signed)
Pt dropped of BP log Will forward to Dr. [name]  1/10 9AM 205/120 HR 67 1/11 9AM 182/114 HR 66 1/12 9AM 189/104 HR 54 1/13 9AM 199/113 HR 56 1/14 9AM 199/116 HR 64 1/15 9AM 214/98   HR 58 1/16 9AM 181/107 HR 68 1/17 9AM 158/101 HR 74 1/18 9AM 193/108 HR 60

## 2015-06-09 NOTE — Telephone Encounter (Signed)
Pt aware and will start hydralazine 25 mg tid and f/u in 2 weeks with BP. Labs from [facility] scanned in [place]. Medication sent to pharmacy.

## 2015-06-09 NOTE — Telephone Encounter (Signed)
No, we will not start chlorthalidone. Please start hydralazine 25mg  three times a day. BP log in 2 weeks   Zandra Abts MD

## 2015-06-09 NOTE — Telephone Encounter (Signed)
Pt says Dr. Hinda Lenis stopped HCTZ and started lasix 20 mg daily, do you still want pt to start chlorthalidone?

## 2015-06-20 ENCOUNTER — Other Ambulatory Visit: Payer: Self-pay | Admitting: *Deleted

## 2015-06-20 DIAGNOSIS — E1142 Type 2 diabetes mellitus with diabetic polyneuropathy: Secondary | ICD-10-CM | POA: Diagnosis not present

## 2015-06-20 DIAGNOSIS — R234 Changes in skin texture: Secondary | ICD-10-CM | POA: Diagnosis not present

## 2015-06-20 DIAGNOSIS — L97521 Non-pressure chronic ulcer of other part of left foot limited to breakdown of skin: Secondary | ICD-10-CM | POA: Diagnosis not present

## 2015-06-20 MED ORDER — ROSUVASTATIN CALCIUM 20 MG PO TABS: 20.0000 mg | ORAL_TABLET | Freq: Every day | ORAL | Status: DC

## 2015-06-20 MED ORDER — CARVEDILOL 25 MG PO TABS: 37.5000 mg | ORAL_TABLET | Freq: Two times a day (BID) | ORAL | Status: DC

## 2015-06-27 NOTE — Telephone Encounter (Signed)
Increase hydralazine to 50mg  tid, keep bp log x 2 weeks and submit   J Dayannara Pascal MD

## 2015-06-27 NOTE — Telephone Encounter (Signed)
Pt is f/u with BP (log on Dr Nelly Laurence desk) says BP has not changed,

## 2015-06-27 NOTE — Telephone Encounter (Signed)
Pt says he still feels fine, but has had some headaches. Will forward to Dr. [name], has not had repeat lab work done this year. Pt will see [facility]in [facility] V[facility] dr soon as well

## 2015-06-27 NOTE — Telephone Encounter (Signed)
Pt agreeable to increase hydralazine and keep BP log. Says that since starting medication, has had trouble with keeping erection, concerned that medication could be causing this. Will forward to provider

## 2015-06-27 NOTE — Addendum Note (Signed)
Addended by: Julian Hy T on: 06/27/2015 02:40 PM   Modules accepted: Orders, Medications

## 2015-07-04 ENCOUNTER — Other Ambulatory Visit: Payer: Self-pay | Admitting: *Deleted

## 2015-07-04 ENCOUNTER — Other Ambulatory Visit: Payer: Self-pay | Admitting: Cardiology

## 2015-07-04 MED ORDER — HYDRALAZINE HCL 50 MG PO TABS: 50.0000 mg | ORAL_TABLET | Freq: Three times a day (TID) | ORAL | Status: DC

## 2015-07-10 ENCOUNTER — Other Ambulatory Visit: Payer: Self-pay | Admitting: *Deleted

## 2015-07-10 DIAGNOSIS — R609 Edema, unspecified: Secondary | ICD-10-CM

## 2015-07-11 DIAGNOSIS — L97512 Non-pressure chronic ulcer of other part of right foot with fat layer exposed: Secondary | ICD-10-CM | POA: Diagnosis not present

## 2015-07-11 DIAGNOSIS — E1142 Type 2 diabetes mellitus with diabetic polyneuropathy: Secondary | ICD-10-CM | POA: Diagnosis not present

## 2015-07-12 ENCOUNTER — Telehealth: Payer: Self-pay | Admitting: Cardiology

## 2015-07-12 MED ORDER — HYDRALAZINE HCL 100 MG PO TABS: 100.0000 mg | ORAL_TABLET | Freq: Three times a day (TID) | ORAL | Status: DC

## 2015-07-12 NOTE — Telephone Encounter (Signed)
[name] called in stating that he is very concerned about his BP.  States that it is running 200/130 to 200/140. Please call [phone]

## 2015-07-12 NOTE — Telephone Encounter (Signed)
Patient informed and verbalized understanding of plan. 

## 2015-07-12 NOTE — Telephone Encounter (Signed)
Can increase hydralazine to 100mg  tid. Can he come in tomorrow for a nursing bp check, bring his cuff to compare. Make sure he takes his meds at least 1 hour before.  Zandra Abts MD

## 2015-07-13 ENCOUNTER — Ambulatory Visit (INDEPENDENT_AMBULATORY_CARE_PROVIDER_SITE_OTHER): Payer: Medicare Other | Admitting: *Deleted

## 2015-07-13 ENCOUNTER — Encounter: Payer: Self-pay | Admitting: *Deleted

## 2015-07-13 VITALS — BP 161/84 | HR 57 | Ht 75.0 in | Wt 257.0 lb

## 2015-07-13 DIAGNOSIS — I10 Essential (primary) hypertension: Secondary | ICD-10-CM | POA: Diagnosis not present

## 2015-07-13 NOTE — Progress Notes (Signed)
Patient presents to office for BP check which was requested on yesterday after patient called the office reporting elevated BP. Patient's hydralazine was increased by Dr. [name] on yesterday. Patient has taken all doses of medications as prescribed without side effects. Patient denies chest pain, dizziness or sob. Please see extended vitals for readings of BP.

## 2015-07-18 ENCOUNTER — Encounter: Payer: Self-pay | Admitting: Vascular Surgery

## 2015-07-18 NOTE — Progress Notes (Signed)
BP Saturday and Sunday 168/128 and 190/130 this morning 184/115. Denies CP/SOB/dizziness only c/o mild headache. Will route to Dr. [name].

## 2015-07-19 MED ORDER — LABETALOL HCL 200 MG PO TABS
400.0000 mg | ORAL_TABLET | Freq: Two times a day (BID) | ORAL | Status: DC
Start: 2015-07-19 — End: 2015-08-22

## 2015-07-19 NOTE — Addendum Note (Signed)
Addended by: Julian Hy T on: 07/19/2015 04:06 PM   Modules accepted: Orders, Medications

## 2015-07-19 NOTE — Progress Notes (Signed)
Stop coreg, start labetalol 400mg  bid. Submit bp log in 1 week.              Zandra Abts MD    ----- Message -----     From: Massie Maroon, CMA     Sent: 07/18/2015 10:10 AM      To: Arnoldo Lenis, MD    Pt verbalized understanding of med changes. Medication sent to pharmacy and pt will call with update in 1 week.

## 2015-07-25 ENCOUNTER — Ambulatory Visit (HOSPITAL_COMMUNITY)
Admission: RE | Admit: 2015-07-25 | Discharge: 2015-07-25 | Disposition: A | Discharge status: Home / Self Care | Payer: Medicare Other | Source: Ambulatory Visit | Attending: Vascular Surgery | Admitting: Vascular Surgery

## 2015-07-25 ENCOUNTER — Ambulatory Visit (INDEPENDENT_AMBULATORY_CARE_PROVIDER_SITE_OTHER): Payer: Medicare Other | Admitting: Vascular Surgery

## 2015-07-25 ENCOUNTER — Encounter: Payer: Self-pay | Admitting: Vascular Surgery

## 2015-07-25 VITALS — BP 170/74 | HR 60 | Temp 98.0°F | Resp 16 | Ht 76.0 in | Wt 258.0 lb

## 2015-07-25 DIAGNOSIS — I2510 Atherosclerotic heart disease of native coronary artery without angina pectoris: Secondary | ICD-10-CM

## 2015-07-25 DIAGNOSIS — E785 Hyperlipidemia, unspecified: Secondary | ICD-10-CM | POA: Insufficient documentation

## 2015-07-25 DIAGNOSIS — N189 Chronic kidney disease, unspecified: Secondary | ICD-10-CM | POA: Insufficient documentation

## 2015-07-25 DIAGNOSIS — R600 Localized edema: Secondary | ICD-10-CM | POA: Diagnosis not present

## 2015-07-25 DIAGNOSIS — E1122 Type 2 diabetes mellitus with diabetic chronic kidney disease: Secondary | ICD-10-CM | POA: Diagnosis not present

## 2015-07-25 DIAGNOSIS — R609 Edema, unspecified: Secondary | ICD-10-CM | POA: Insufficient documentation

## 2015-07-25 DIAGNOSIS — I129 Hypertensive chronic kidney disease with stage 1 through stage 4 chronic kidney disease, or unspecified chronic kidney disease: Secondary | ICD-10-CM | POA: Insufficient documentation

## 2015-07-25 DIAGNOSIS — I83892 Varicose veins of left lower extremities with other complications: Secondary | ICD-10-CM | POA: Diagnosis not present

## 2015-07-25 NOTE — Progress Notes (Signed)
Subjective:     Patient ID: [name], male   DOB: 1950/08/27, 65 y.o.   MRN: [id number]  HPI This 65 year old male was referred by Dr. [name] for evaluation of bilateral lower extremity edema. This has been present for many years left worse than right. He has no history of DVT thrombophlebitis stasis ulcers or bleeding. He has had bilateral second toe amputations because of diabetes and also had an ulceration on the lateral aspect of his foot which required surgery. He does have claudication at one block in the calves and hips. He denies any rest pain  Or numbness in the feet. He does take a diuretic currently Lasix and has been on HCTZ in the past. He is followed by the renal physicians for stage III renal insufficiency. He has a long history of tobacco abuse 50+ years at 1-2 packs per day.  Past Medical History  Diagnosis Date  . CAD (coronary artery disease)     multivessel  . Two-vessel coronary artery disease     moderate. by cath in 2003. Status post stenting of the mdi RCA November 1999 normal left ventricular ejection fraction  . Dyslipidemia   . DM2 (diabetes mellitus, type 2) ([place])   . HTN (hypertension)   . COPD (chronic obstructive pulmonary disease) ([place])     with ongoing tobacco use and patient failed sham takes  . Morbid obesity ([place])   . 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 Chronic renal insufficiency   . Reflux esophagitis     hx  . 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   . Renal insufficiency     Social History  Substance Use Topics  . Smoking status: Current Every Day Smoker -- 1.00 packs/day for 40 years    Types: Cigarettes    Start date: 05/20/1968  . Smokeless tobacco: Never Used  . Alcohol Use: No    Family History  Problem Relation Age of Onset  . Diabetes Mother   . Alcohol abuse Father     No Known  Allergies   Current outpatient prescriptions:  .  albuterol (VENTOLIN HFA) 108 (90 BASE) MCG/ACT inhaler, Inhale 2 puffs into the lungs every 6 (six) hours as needed., Disp: , Rfl:  .  amLODipine (NORVASC) 10 MG tablet, Take 10 mg by mouth daily.  , Disp: , Rfl:  .  aspirin 81 MG tablet, Take 81 mg by mouth daily.  , Disp: , Rfl:  .  cholecalciferol (VITAMIN D) 1000 UNITS tablet, Take 1,000 Units by mouth daily., Disp: , Rfl:  .  clonazePAM (KLONOPIN) 0.5 MG tablet, Take 0.5 mg by mouth daily. , Disp: , Rfl:  .  furosemide (LASIX) 20 MG tablet, Take 20 mg by mouth daily., Disp: , Rfl: 4 .  hydrALAZINE (APRESOLINE) 100 MG tablet, Take 1 tablet (100 mg total) by mouth 3 (three) times daily. (Patient taking differently: Take 50 mg by mouth 3 (three) times daily. ), Disp: 270 tablet, Rfl: 2 .  insulin glargine (LANTUS) 100 UNIT/ML injection, Inject 20 Units into the skin every morning. , Disp: , Rfl:  .  labetalol (NORMODYNE) 200 MG tablet, Take 2 tablets (400 mg total) by mouth 2 (two) times daily., Disp: 120 tablet, Rfl: 3 .  Multiple Vitamin (MULTIVITAMIN) tablet, Take 1 tablet by mouth daily.  , Disp: , Rfl:  .  omeprazole (PRILOSEC)  20 MG capsule, Take 20 mg by mouth daily.  , Disp: , Rfl:  .  ramipril (ALTACE) 10 MG tablet, Take 10 mg by mouth daily.  , Disp: , Rfl:  .  rosuvastatin (CRESTOR) 20 MG tablet, Take 1 tablet (20 mg total) by mouth daily., Disp: 30 tablet, Rfl: 6 .  sildenafil (REVATIO) 20 MG tablet, Take 2 tablets by mouth daily as needed., Disp: , Rfl: 5 .  TRULICITY 1.5 0000000 SOPN, INJECT 1.5 MG SUBCUTANEOUSLY ONCE A WEEK, Disp: , Rfl: 11 .  zolpidem (AMBIEN) 10 MG tablet, Take 10 mg by mouth daily as needed., Disp: , Rfl: 5  Filed Vitals:   07/25/15 0929 07/25/15 0930  BP: 155/83 170/74  Pulse: 60 60  Temp: 98 F (36.7 C)   Resp: 16   Height: 6\' 4"  (1.93 m)   Weight: 258 lb (117.028 kg)   SpO2: 98%     Body mass index is 31.42 kg/(m^2).           Review  of Systems Denies chest pain , dyspnea on exertion, PND, orthopnea, hemoptysis, lateralizing weakness, aphasia, amaurosis fugax, diplopia, blurred vision, syncope. See history of present illness. Other systems negative and a complete review of systems     Objective:   Physical Exam BP 170/74 mmHg  Pulse 60  Temp(Src) 98 F (36.7 C)  Resp 16  Ht 6\' 4"  (1.93 m)  Wt 258 lb (117.028 kg)  BMI 31.42 kg/m2  SpO2 98%  Gen.-alert and oriented x3 in no apparent distress HEENT normal for age Lungs no rhonchi or wheezing Cardiovascular regular rhythm no murmurs carotid pulses 3+ palpable no bruits audible Abdomen soft nontender no palpable masses Musculoskeletal free of  major deformities Skin clear -no rashes Neurologic normal Lower extremities 3+ femoral and  Popliteal pulses palpable bilaterally. No distal pulses are palpable. Both feet are pink warm and well-perfused. Well-healed second toe amputations bilaterally. No varicosities are noted. Hyperpigmentation lower third left leg greater than right. 1+ chronic edema bilatrally. No active venous stasis ulcers noted.   today I ordered a bilateral venous duplex exam which I reviewed and interpreted. There is no DVT. There is no superficial vein reflux although the caliber of the saphenous veins is slightly larger than normal. There is deep vein reflux particularly on the left.       Assessment:      bilateral lower extremity edema with deep vein reflux on the left. No DVT noted. No superficial vein reflux.  long history of ongoing tobacco abuse 1-2 packs per day for 50+ years  coronary artery disease previous MI 20 years ago  PAD previous bilateral toe agitations Diabetes mellitus   irritable bowel syndrome    Plan:       1 elevate foot of bed 2-3 inches  #2 short-leg elastic compression stockings to be placed on early in the morning #3 continue diuretics as per renal recommends #40 roll for any other intervention and we'll see him  back on a when necessary basis

## 2015-07-25 NOTE — Progress Notes (Signed)
Filed Vitals:   07/25/15 0929 07/25/15 0930  BP: 155/83 170/74  Pulse: 60 60  Temp: 98 F (36.7 C)   Resp: 16   Height: 6\' 4"  (1.93 m)   Weight: 258 lb (117.028 kg)   SpO2: 98%

## 2015-07-28 ENCOUNTER — Ambulatory Visit (INDEPENDENT_AMBULATORY_CARE_PROVIDER_SITE_OTHER): Payer: Medicare Other | Admitting: Adult Health

## 2015-07-28 ENCOUNTER — Encounter: Payer: Self-pay | Admitting: Adult Health

## 2015-07-28 ENCOUNTER — Telehealth: Payer: Self-pay | Admitting: Cardiology

## 2015-07-28 VITALS — BP 160/80 | HR 62 | Ht 76.0 in | Wt 267.0 lb

## 2015-07-28 DIAGNOSIS — I10 Essential (primary) hypertension: Secondary | ICD-10-CM | POA: Diagnosis not present

## 2015-07-28 DIAGNOSIS — Z79899 Other long term (current) drug therapy: Secondary | ICD-10-CM

## 2015-07-28 DIAGNOSIS — R609 Edema, unspecified: Secondary | ICD-10-CM

## 2015-07-28 DIAGNOSIS — I2510 Atherosclerotic heart disease of native coronary artery without angina pectoris: Secondary | ICD-10-CM

## 2015-07-28 MED ORDER — ISOSORBIDE MONONITRATE ER 30 MG PO TB24: 15.0000 mg | ORAL_TABLET | Freq: Every day | ORAL | Status: DC

## 2015-07-28 NOTE — Patient Instructions (Signed)
Medication Instructions:  Start isosorbide 15 mg daily   Labwork:Your physician recommends that you return for lab work in: ASAP   Testing/Procedures: Your physician has requested that you have an echocardiogram. Echocardiography is a painless test that uses sound waves to create images of your heart. It provides your doctor with information about the size and shape of your heart and how well your heart's chambers and valves are working. This procedure takes approximately one hour. There are no restrictions for this procedure.    Follow-Up: Your physician recommends that you schedule a follow-up appointment in:1 MONTH WITH DR. BRANCH IN [place]    Any Other Special Instructions Will Be Listed Below (If Applicable).     If you need a refill on your cardiac medications before your next appointment, please call your pharmacy.

## 2015-07-28 NOTE — Telephone Encounter (Signed)
Would like to speak with someone about his BP staying high and he is retaining fluid.

## 2015-07-28 NOTE — Progress Notes (Deleted)
Name: [name]    DOB: July 26, 1950  Age: 65 y.o.  MR#: [id number]       PCP:  [name], MD      Insurance: Payor: MEDICARE / Plan: MEDICARE PART A AND B / Product Type: *No Product type* /   CC:   No chief complaint on file.   VS Filed Vitals:   07/28/15 1514  BP: 160/80  Pulse: 62  Height: 6\' 4"  (1.93 m)  Weight: 267 lb (121.11 kg)  SpO2: 96%    Weights Current Weight  07/28/15 267 lb (121.11 kg)  07/25/15 258 lb (117.028 kg)  07/13/15 257 lb (116.574 kg)    Blood Pressure  BP Readings from Last 3 Encounters:  07/28/15 160/80  07/25/15 170/74  07/13/15 161/84     Admit date:  (Not on file) Last encounter with RMR:  Visit date not found   Allergy Review of patient's allergies indicates no known allergies.  Current Outpatient Prescriptions  Medication Sig Dispense Refill  . hydrALAZINE (APRESOLINE) 100 MG tablet Take 100 mg by mouth 3 (three) times daily.    Marland Kitchen albuterol (VENTOLIN HFA) 108 (90 BASE) MCG/ACT inhaler Inhale 2 puffs into the lungs every 6 (six) hours as needed.    Marland Kitchen amLODipine (NORVASC) 10 MG tablet Take 10 mg by mouth daily.      Marland Kitchen aspirin 81 MG tablet Take 81 mg by mouth daily.      . cholecalciferol (VITAMIN D) 1000 UNITS tablet Take 1,000 Units by mouth daily.    . clonazePAM (KLONOPIN) 0.5 MG tablet Take 0.5 mg by mouth daily.     . furosemide (LASIX) 20 MG tablet Take 20 mg by mouth daily.  4  . insulin glargine (LANTUS) 100 UNIT/ML injection Inject 20 Units into the skin every morning.     . labetalol (NORMODYNE) 200 MG tablet Take 2 tablets (400 mg total) by mouth 2 (two) times daily. 120 tablet 3  . Multiple Vitamin (MULTIVITAMIN) tablet Take 1 tablet by mouth daily.      Marland Kitchen omeprazole (PRILOSEC) 20 MG capsule Take 20 mg by mouth daily.      . ramipril (ALTACE) 10 MG tablet Take 10 mg by mouth daily.      . rosuvastatin (CRESTOR) 20 MG tablet Take 1 tablet (20 mg total) by mouth daily. 30 tablet 6  . sildenafil (REVATIO) 20 MG tablet Take  2 tablets by mouth daily as needed.  5  . TRULICITY 1.5 0000000 SOPN INJECT 1.5 MG SUBCUTANEOUSLY ONCE A WEEK  11  . zolpidem (AMBIEN) 10 MG tablet Take 10 mg by mouth daily as needed.  5   No current facility-administered medications for this visit.    Discontinued Meds:    Medications Discontinued During This Encounter  Medication Reason  . hydrALAZINE (APRESOLINE) 100 MG tablet Error    Patient Active Problem List   Diagnosis Date Noted  . Bilateral lower extremity edema 07/25/2015  . Foot infection 09/27/2014  . Diabetic foot infection ([place]) 09/27/2014  . CKD stage 3 due to type 2 diabetes mellitus ([place])   . Diabetes mellitus with renal manifestations, uncontrolled ([place])   . Benign essential HTN   . ED (erectile dysfunction) 06/09/2012  . Hypertension 03/11/2011  . OSTEOMYELITIS, ACUTE, ANKLE/FOOT 07/24/2010  . EDEMA 02/15/2010  . MORBID OBESITY 07/05/2009  . TOBACCO ABUSE 07/05/2009  . OBSTRUCTIVE SLEEP APNEA 07/05/2009  . COPD 07/05/2009  . 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 HYPERLIPIDEMIA 03/30/2008  . GENERALIZED ANXIETY  DISORDER 03/30/2008  . CORONARY ATHEROSCLEROSIS NATIVE CORONARY ARTERY 03/30/2008  . BUERGER'S DISEASE 03/30/2008    LABS    Component Value Date/Time   NA 139 09/28/2014 0415   NA 136 09/27/2014 0558   NA 134* 09/26/2014 2143   K 3.3* 09/28/2014 0415   K 3.5 09/27/2014 0558   K 2.8* 09/26/2014 2143   CL 102 09/28/2014 0415   CL 100* 09/27/2014 0558   CL 97* 09/26/2014 2143   CO2 30 09/28/2014 0415   CO2 27 09/27/2014 0558   CO2 27 09/26/2014 2143   GLUCOSE 97 09/28/2014 0415   GLUCOSE 110* 09/27/2014 0558   GLUCOSE 102* 09/26/2014 2143   BUN 26* 09/28/2014 0415   BUN 32* 09/27/2014 0558   BUN 38* 09/26/2014 2143   CREATININE 1.86* 09/28/2014 0415   CREATININE 2.02* 09/27/2014 0558   CREATININE 2.26* 09/26/2014 2143   CALCIUM 8.3* 09/28/2014 0415   CALCIUM 8.3* 09/27/2014 0558   CALCIUM 8.8* 09/26/2014 2143   GFRNONAA 37* 09/28/2014 0415    GFRNONAA 34* 09/27/2014 0558   GFRNONAA 30* 09/26/2014 2143   GFRAA 43* 09/28/2014 0415   GFRAA 39* 09/27/2014 0558   GFRAA 34* 09/26/2014 2143   CMP     Component Value Date/Time   NA 139 09/28/2014 0415   K 3.3* 09/28/2014 0415   CL 102 09/28/2014 0415   CO2 30 09/28/2014 0415   GLUCOSE 97 09/28/2014 0415   BUN 26* 09/28/2014 0415   CREATININE 1.86* 09/28/2014 0415   CALCIUM 8.3* 09/28/2014 0415   PROT 6.7 09/27/2014 0558   ALBUMIN 3.3* 09/27/2014 0558   AST 15 09/27/2014 0558   ALT 11* 09/27/2014 0558   ALKPHOS 45 09/27/2014 0558   BILITOT 1.0 09/27/2014 0558   GFRNONAA 37* 09/28/2014 0415   GFRAA 43* 09/28/2014 0415       Component Value Date/Time   WBC 10.0 09/27/2014 0558   WBC 10.0 09/26/2014 2143   WBC 8.1 11/07/2010 1145   HGB 15.9 09/27/2014 0558   HGB 16.4 09/26/2014 2143   HGB 13.8 11/07/2010 1145   HCT 46.4 09/27/2014 0558   HCT 47.3 09/26/2014 2143   HCT 42.8 11/07/2010 1145   MCV 93.7 09/27/2014 0558   MCV 93.5 09/26/2014 2143   MCV 82.8 11/07/2010 1145    Lipid Panel  No results found for: CHOL, TRIG, HDL, CHOLHDL, VLDL, LDLCALC, LDLDIRECT  ABG    Component Value Date/Time   PHART 7.417 08/04/2013 1428   PCO2ART 38.0 08/04/2013 1428   PO2ART 75.5* 08/04/2013 1428   HCO3 24.0 08/04/2013 1428   TCO2 20.6 08/04/2013 1428   O2SAT 94.9 08/04/2013 1428     No results found for: TSH BNP (last 3 results) No results for input(s): BNP in the last 8760 hours.  ProBNP (last 3 results) No results for input(s): PROBNP in the last 8760 hours.  Cardiac Panel (last 3 results) No results for input(s): CKTOTAL, CKMB, TROPONINI, RELINDX in the last 72 hours.  Iron/TIBC/Ferritin/ %Sat No results found for: IRON, TIBC, FERRITIN, IRONPCTSAT   EKG Orders placed or performed in visit on 11/08/14  . EKG 12-Lead     Prior Assessment and Plan Problem List as of 07/28/2015      Cardiovascular and Mediastinum   Hypertension   Last Assessment & Plan  06/09/2012 Office Visit Written 06/09/2012  3:21 PM by Wellington Hampshire, MD    Blood pressure is well controlled on current medications.      CORONARY ATHEROSCLEROSIS NATIVE  CORONARY ARTERY   Last Assessment & Plan 06/09/2012 Office Visit Written 06/09/2012  3:20 PM by Wellington Hampshire, MD    The patient has no symptoms suggestive of angina. Continue medical therapy.      [place] 03/11/2011 Office Visit Written 03/11/2011 11:53 AM by Ezra Sites, MD    Status post toe amputation. Well-healed ulcers followed closely by the wound Center.      Benign essential HTN     Respiratory   OBSTRUCTIVE SLEEP APNEA   COPD     Endocrine   AODM   Diabetic foot infection ([place])   Diabetes mellitus with renal manifestations, uncontrolled ([place])     Musculoskeletal and Integument   OSTEOMYELITIS, ACUTE, ANKLE/FOOT   Last Assessment & Plan 02/13/2011 Office Visit Written 02/13/2011 11:43 AM by Campbell Riches, MD    He appears to be doing very well. He would like imaging done to confirm that he no longer has this infection in his body. Will repeat his tagged WBC study, spoke with him about how non-specific this study is and that a positive does not necessarily indicate continued infection. He will continue on his bactrim til after his study.         Genitourinary   ED (erectile dysfunction)   Last Assessment & Plan 06/09/2012 Office Visit Written 06/09/2012  3:23 PM by Wellington Hampshire, MD    The patient complains of significant erectile dysfunction in spite of daily use of low-dose Cialis. He asked if he could use as needed Viagra in addition to Cialis. I explained to him that it's better to stay with the same medication and agreed to give him one month supply of Cialis 10 mg to be used as needed. I asked him to discuss this with Dr. Nadara Mustard as well. He knows not to take these kind of medications with nitroglycerin.      CKD stage 3 due to type 2 diabetes mellitus  [facility])     Other   HYPERLIPIDEMIA   Last Assessment & Plan 06/09/2012 Office Visit Written 06/09/2012  3:21 PM by Wellington Hampshire, MD    Continue treatment with rosuvastatin given his history of CAD. Recommend yearly liver function testing.      MORBID OBESITY   GENERALIZED ANXIETY DISORDER   TOBACCO ABUSE   Last Assessment & Plan 03/11/2011 Office Visit Written 03/11/2011 11:54 AM by Ezra Sites, MD    Patient again was counseled regarding tobacco use      EDEMA   Last Assessment & Plan 06/09/2012 Office Visit Written 06/09/2012  3:21 PM by Wellington Hampshire, MD    This is stable on current dose of furosemide      Foot infection   Bilateral lower extremity edema       Imaging: No results found.

## 2015-07-28 NOTE — Progress Notes (Signed)
Cardiology Office Note   Date:  07/28/2015   ID:  [name] 10/01/1950, MRN [id number]  PCP:  [name], MD  Cardiologist: Cloria Spring, NP   Chief Complaint  Patient presents with  . Hypertension  . Chest Pain      History of Present Illness: [name] is a 65 y.o. male who presents for ongoing assessment and management of coronary artery disease, prior stents.  The right coronary artery in 1999, most recent catheterization in 2013 revealed patent stent with proximal 40-50% disease.  His left main was 20-30% stenosis LAD 50-60% stenosis and 6070%, mid, left circumflex was min A-V portion 67%.  Other history includes hyperlipidemia, hypertension.  He comes today as an add-on for complaints of lower extremity edema, and hypertension.  He is normally seen in the [place] office, but his concerns prompted a phone call to our office and he is being seen today.of note, the patient has been seen by Dr. Kellie Simmering, with a history of chronic lower extremity edema, with no history of DVT, thrombophlebitis, or venous stasis.  The patient has had lower extremity edema, greater on the left than right.is advised to use support hose.  Continue diuretics .  When seen by Dr. Kellie Simmering on 07/25/2015.  In the office, hiis blood pressure was 170/74.  He states that he is unhappy with his blood pressure,and is unhappy with the lower extremity edema.  He has not begun wearing his support hose.  He unfortunately continues to smoke heavily.  Past Medical History  Diagnosis Date  . CAD (coronary artery disease)     multivessel  . Two-vessel coronary artery disease     moderate. by cath in 2003. Status post stenting of the mdi RCA November 1999 normal left ventricular ejection fraction  . Dyslipidemia   . DM2 (diabetes mellitus, type 2) ([place])   . HTN (hypertension)   . COPD (chronic obstructive pulmonary disease) ([place])     with ongoing tobacco use and patient failed sham takes  . Morbid  obesity ([place])   . 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 Chronic renal insufficiency   . Reflux esophagitis     hx  . 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   . Renal insufficiency     Past Surgical History  Procedure Laterality Date  . Back surgery    . Abdominal surgery    . Diabetic ulcers       Current Outpatient Prescriptions  Medication Sig Dispense Refill  . hydrALAZINE (APRESOLINE) 100 MG tablet Take 100 mg by mouth 3 (three) times daily.    Marland Kitchen albuterol (VENTOLIN HFA) 108 (90 BASE) MCG/ACT inhaler Inhale 2 puffs into the lungs every 6 (six) hours as needed.    Marland Kitchen amLODipine (NORVASC) 10 MG tablet Take 10 mg by mouth daily.      Marland Kitchen aspirin 81 MG tablet Take 81 mg by mouth daily.      . cholecalciferol (VITAMIN D) 1000 UNITS tablet Take 1,000 Units by mouth daily.    . clonazePAM (KLONOPIN) 0.5 MG tablet Take 0.5 mg by mouth daily.     . furosemide (LASIX) 20 MG tablet Take 20 mg by mouth daily.  4  . insulin glargine (LANTUS) 100 UNIT/ML injection Inject 20 Units into the skin every morning.     . isosorbide mononitrate (IMDUR) 30  MG 24 hr tablet Take 0.5 tablets (15 mg total) by mouth daily. 90 tablet 3  . labetalol (NORMODYNE) 200 MG tablet Take 2 tablets (400 mg total) by mouth 2 (two) times daily. 120 tablet 3  . Multiple Vitamin (MULTIVITAMIN) tablet Take 1 tablet by mouth daily.      Marland Kitchen omeprazole (PRILOSEC) 20 MG capsule Take 20 mg by mouth daily.      . ramipril (ALTACE) 10 MG tablet Take 10 mg by mouth daily.      . rosuvastatin (CRESTOR) 20 MG tablet Take 1 tablet (20 mg total) by mouth daily. 30 tablet 6  . sildenafil (REVATIO) 20 MG tablet Take 2 tablets by mouth daily as needed.  5  . TRULICITY 1.5 0000000 SOPN INJECT 1.5 MG SUBCUTANEOUSLY ONCE A WEEK  11  . zolpidem (AMBIEN) 10 MG tablet Take 10 mg by mouth daily as needed.  5   No current  facility-administered medications for this visit.    Allergies:   Review of patient's allergies indicates no known allergies.    Social History:  The patient  reports that he has been smoking Cigarettes.  He started smoking about 47 years ago. He has a 40 pack-year smoking history. He has never used smokeless tobacco. He reports that he does not drink alcohol or use illicit drugs.   Family History:  The patient's family history includes Alcohol abuse in his father; Diabetes in his mother.    ROS: All other systems are reviewed and negative. Unless otherwise mentioned in H&P    PHYSICAL EXAM: VS:  BP 160/80 mmHg  Pulse 62  Ht 6\' 4"  (1.93 m)  Wt 267 lb (121.11 kg)  BMI 32.51 kg/m2  SpO2 96% , BMI Body mass index is 32.51 kg/(m^2). GEN: Well nourished, well developed, in no acute distress HEENT: normal Neck: no JVD, carotid bruits, or masses Cardiac: RRR; no murmurs, rubs, or gallops,no edema  Respiratory:  clear to auscultation bilaterally, normal work of breathing GI: soft, nontender, nondistended, + BS MS: no deformity or atrophy Skin: warm and dry, no rash Neuro:  Strength and sensation are intact Psych: euthymic mood, full affect  Recent Labs: 09/27/2014: ALT 11*; Hemoglobin 15.9; Platelets 153 09/28/2014: BUN 26*; Creatinine, Ser 1.86*; Potassium 3.3*; Sodium 139    Lipid Panel No results found for: CHOL, TRIG, HDL, CHOLHDL, VLDL, LDLCALC, LDLDIRECT    Wt Readings from Last 3 Encounters:  07/28/15 267 lb (121.11 kg)  07/25/15 258 lb (117.028 kg)  07/13/15 257 lb (116.574 kg)     ASSESSMENT AND PLAN:  1. Hypertension: Blood pressure remains elevated.  I am going to do some lab work as he has not had any done recently to check his kidney function as he is on Lasix, and an ACE inhibitor.  Before increasing his medication regimen.  I would like to evaluate his kidney function.  He will continue everything as directed, but will a low dose of isosorbide 15 mg daily.  He  will followup with an echocardiogram, as well as not been completed in several years, to evaluate for LV systolic function, and diastolic dysfunction.he is advised on low sodium diet.  2. Chronic lower extremity edema:I have advised him to wear his support, toes to avoid venous stasis.  3. Ongoing tobacco abuse: He is smoking a pack to a pack and a half a day.  I have advised him on cessation.  He states he has tried everything from hypnosis to  Chantix.  Current medicines are reviewed at length with the patient today.    Labs/ tests ordered today include: BMET magnesium, testosterone level.  Echocardiogram.  Orders Placed This Encounter  Procedures  . Basic Metabolic Panel (BMET)  . Magnesium  . Testosterone  . Echocardiogram     Disposition:   FU with 1 month in [place], echocardiogram can be done in [place].  Signed, Jory Sims, NP  07/28/2015 4:10 PM    [facility] [address] [place], [place] [address] Phone: [phone]; Fax: [phone]

## 2015-07-28 NOTE — Telephone Encounter (Signed)
Spoke with patient and he is concerned about his blood pressure continuing to be elevated. Patient also c/o retaining a lot of fluid with weight gain. Last weight was 264 lbs a few days ago and 2 weeks ago his weight was 252 lbs. No c/o sob, dizziness or chest pain. Patient was given an appointment today at 2:50 pm with NP at the [facility] office.  Please see BP readings below:  07/28/15 177/93  07/27/15 143/80   07/26/15 143/76    07/25/15 165/90  07/24/15 142/76  07/23/15 184/110  07/22/15 180/102  07/21/15 116/67

## 2015-07-29 LAB — BASIC METABOLIC PANEL
BUN: 19 mg/dL (ref 7–25)
CO2: 32 mmol/L — ABNORMAL HIGH (ref 20–31)
Calcium: 8.7 mg/dL (ref 8.6–10.3)
Chloride: 105 mmol/L (ref 98–110)
Creat: 2.01 mg/dL — ABNORMAL HIGH (ref 0.70–1.25)
Glucose, Bld: 59 mg/dL — ABNORMAL LOW (ref 65–99)
Potassium: 5 mmol/L (ref 3.5–5.3)
Sodium: 142 mmol/L (ref 135–146)

## 2015-07-29 LAB — TESTOSTERONE: Testosterone: 297 ng/dL (ref 250–827)

## 2015-07-29 LAB — MAGNESIUM: Magnesium: 2.2 mg/dL (ref 1.5–2.5)

## 2015-07-31 ENCOUNTER — Ambulatory Visit (HOSPITAL_COMMUNITY)
Admission: RE | Admit: 2015-07-31 | Discharge: 2015-07-31 | Disposition: A | Discharge status: Home / Self Care | Payer: Medicare Other | Source: Ambulatory Visit | Attending: Adult Health | Admitting: Adult Health

## 2015-07-31 ENCOUNTER — Telehealth: Payer: Self-pay

## 2015-07-31 ENCOUNTER — Other Ambulatory Visit: Payer: Self-pay

## 2015-07-31 DIAGNOSIS — Z79899 Other long term (current) drug therapy: Secondary | ICD-10-CM

## 2015-07-31 DIAGNOSIS — I700 Atherosclerosis of aorta: Secondary | ICD-10-CM | POA: Insufficient documentation

## 2015-07-31 DIAGNOSIS — J90 Pleural effusion, not elsewhere classified: Secondary | ICD-10-CM | POA: Diagnosis not present

## 2015-07-31 DIAGNOSIS — R0602 Shortness of breath: Secondary | ICD-10-CM

## 2015-07-31 DIAGNOSIS — R05 Cough: Secondary | ICD-10-CM | POA: Diagnosis not present

## 2015-07-31 MED ORDER — TORSEMIDE 20 MG PO TABS: ORAL_TABLET | ORAL | Status: DC

## 2015-07-31 NOTE — Telephone Encounter (Signed)
Let pt know to come to [facility] and have CXR done. He has an echo scheduled for 3/16 in [place] and a follow-up with Dr. [name] on 4/4. I let him know if his symptoms persist to go to ED.

## 2015-07-31 NOTE — Telephone Encounter (Signed)
-----   Message from Lendon Colonel, NP sent at 07/31/2015  2:40 PM EDT ----- CXR showed some mild interstitial edema. Will change lasix to torsemide 20 mg  BID for 3 days and then 20 mg daily.. Follow up BMET in one week. He will need to see cardiologist on next appointment.

## 2015-07-31 NOTE — Telephone Encounter (Signed)
Schedule him for CXR and echocardiogram. He has chronic fluid retention in the LE and also is a heavy smoker. If symptoms persist, he is to go to [facility] ER. Would not add any more diuretics at this time.

## 2015-07-31 NOTE — Telephone Encounter (Signed)
Spoke to pt, let him know the medication change and to get BMET done in one week. Has a follow up appt. With Dr. [name] 4/4.

## 2015-07-31 NOTE — Telephone Encounter (Signed)
Spoke to pt about his lab results. He stated that he is retaining a lot of fluid right now. He said his weight is up 5 lbs since he was in our office last week. He also stated that he was feeling chest pressure and having some shortness of breath that he feels is due to the fluid. He wanted to know if there was anything he could do to get the fluid off besides that lasix due to his labs being off some. Please advise.

## 2015-07-31 NOTE — Telephone Encounter (Signed)
-----   Message from Lendon Colonel, NP sent at 07/31/2015  7:00 AM EDT ----- Labs reviewed. Elevated CO2 and creatinine. Evidence of dehydration. Change lasix to prn instead of daily. All other labs ok

## 2015-08-01 DIAGNOSIS — E1142 Type 2 diabetes mellitus with diabetic polyneuropathy: Secondary | ICD-10-CM | POA: Diagnosis not present

## 2015-08-03 ENCOUNTER — Other Ambulatory Visit: Payer: Self-pay

## 2015-08-03 ENCOUNTER — Ambulatory Visit (INDEPENDENT_AMBULATORY_CARE_PROVIDER_SITE_OTHER): Payer: Medicare Other

## 2015-08-03 DIAGNOSIS — R609 Edema, unspecified: Secondary | ICD-10-CM | POA: Diagnosis not present

## 2015-08-07 DIAGNOSIS — E559 Vitamin D deficiency, unspecified: Secondary | ICD-10-CM | POA: Diagnosis not present

## 2015-08-07 DIAGNOSIS — Z79899 Other long term (current) drug therapy: Secondary | ICD-10-CM | POA: Diagnosis not present

## 2015-08-07 DIAGNOSIS — I10 Essential (primary) hypertension: Secondary | ICD-10-CM | POA: Diagnosis not present

## 2015-08-07 DIAGNOSIS — D519 Vitamin B12 deficiency anemia, unspecified: Secondary | ICD-10-CM | POA: Diagnosis not present

## 2015-08-07 DIAGNOSIS — R809 Proteinuria, unspecified: Secondary | ICD-10-CM | POA: Diagnosis not present

## 2015-08-07 DIAGNOSIS — D509 Iron deficiency anemia, unspecified: Secondary | ICD-10-CM | POA: Diagnosis not present

## 2015-08-07 DIAGNOSIS — N183 Chronic kidney disease, stage 3 (moderate): Secondary | ICD-10-CM | POA: Diagnosis not present

## 2015-08-08 LAB — BASIC METABOLIC PANEL
BUN/Creatinine Ratio: 12 (ref 10–22)
BUN: 22 mg/dL (ref 8–27)
CO2: 27 mmol/L (ref 18–29)
Calcium: 8.9 mg/dL (ref 8.6–10.2)
Chloride: 101 mmol/L (ref 96–106)
Creatinine, Ser: 1.81 mg/dL — ABNORMAL HIGH (ref 0.76–1.27)
GFR calc Af Amer: 45 mL/min/{1.73_m2} — ABNORMAL LOW (ref >59)
GFR calc non Af Amer: 39 mL/min/{1.73_m2} — ABNORMAL LOW (ref >59)
Glucose: 87 mg/dL (ref 65–99)
Potassium: 3.9 mmol/L (ref 3.5–5.2)
Sodium: 144 mmol/L (ref 134–144)

## 2015-08-09 ENCOUNTER — Telehealth: Payer: Self-pay | Admitting: *Deleted

## 2015-08-09 DIAGNOSIS — R809 Proteinuria, unspecified: Secondary | ICD-10-CM | POA: Diagnosis not present

## 2015-08-09 DIAGNOSIS — N183 Chronic kidney disease, stage 3 (moderate): Secondary | ICD-10-CM | POA: Diagnosis not present

## 2015-08-09 DIAGNOSIS — I10 Essential (primary) hypertension: Secondary | ICD-10-CM | POA: Diagnosis not present

## 2015-08-09 NOTE — Telephone Encounter (Signed)
-----   Message from Lendon Colonel, NP sent at 08/08/2015  6:59 AM EDT ----- Labs are returning to baseline. Ask if he is feeling better and having decreased fluid retention with change to torsemide from lasix. Has he lost weight and feeling better?

## 2015-08-09 NOTE — Telephone Encounter (Signed)
Pt reports BP is 200/120. Starting wt of 267 lb and wt yesterday was 254 lb and wt today is 256 lb. Pt. States he does not feel any better. He does have an appt. With Dr. Lowanda Foster @ 1:00 today.

## 2015-08-10 NOTE — Telephone Encounter (Signed)
Will defer to Dr. Hinda Lenis for more recommendations concerning renal insufficiency.

## 2015-08-11 DIAGNOSIS — R809 Proteinuria, unspecified: Secondary | ICD-10-CM | POA: Diagnosis not present

## 2015-08-11 DIAGNOSIS — Z79899 Other long term (current) drug therapy: Secondary | ICD-10-CM | POA: Diagnosis not present

## 2015-08-11 DIAGNOSIS — I10 Essential (primary) hypertension: Secondary | ICD-10-CM | POA: Diagnosis not present

## 2015-08-11 DIAGNOSIS — N184 Chronic kidney disease, stage 4 (severe): Secondary | ICD-10-CM | POA: Diagnosis not present

## 2015-08-16 DIAGNOSIS — N179 Acute kidney failure, unspecified: Secondary | ICD-10-CM | POA: Diagnosis not present

## 2015-08-16 DIAGNOSIS — I10 Essential (primary) hypertension: Secondary | ICD-10-CM | POA: Diagnosis not present

## 2015-08-16 DIAGNOSIS — N183 Chronic kidney disease, stage 3 (moderate): Secondary | ICD-10-CM | POA: Diagnosis not present

## 2015-08-16 DIAGNOSIS — R609 Edema, unspecified: Secondary | ICD-10-CM | POA: Diagnosis not present

## 2015-08-16 DIAGNOSIS — R809 Proteinuria, unspecified: Secondary | ICD-10-CM | POA: Diagnosis not present

## 2015-08-18 ENCOUNTER — Other Ambulatory Visit (HOSPITAL_COMMUNITY): Payer: Self-pay | Admitting: Nephrology

## 2015-08-18 DIAGNOSIS — R809 Proteinuria, unspecified: Secondary | ICD-10-CM

## 2015-08-22 ENCOUNTER — Ambulatory Visit (INDEPENDENT_AMBULATORY_CARE_PROVIDER_SITE_OTHER): Payer: Medicare Other | Admitting: Cardiology

## 2015-08-22 ENCOUNTER — Encounter: Payer: Self-pay | Admitting: *Deleted

## 2015-08-22 ENCOUNTER — Encounter: Payer: Self-pay | Admitting: Cardiology

## 2015-08-22 VITALS — BP 197/85 | HR 61 | Ht 75.0 in | Wt 250.0 lb

## 2015-08-22 DIAGNOSIS — I5033 Acute on chronic diastolic (congestive) heart failure: Secondary | ICD-10-CM | POA: Diagnosis not present

## 2015-08-22 DIAGNOSIS — I10 Essential (primary) hypertension: Secondary | ICD-10-CM | POA: Diagnosis not present

## 2015-08-22 DIAGNOSIS — I2510 Atherosclerotic heart disease of native coronary artery without angina pectoris: Secondary | ICD-10-CM | POA: Diagnosis not present

## 2015-08-22 DIAGNOSIS — E1142 Type 2 diabetes mellitus with diabetic polyneuropathy: Secondary | ICD-10-CM | POA: Diagnosis not present

## 2015-08-22 DIAGNOSIS — L97521 Non-pressure chronic ulcer of other part of left foot limited to breakdown of skin: Secondary | ICD-10-CM | POA: Diagnosis not present

## 2015-08-22 MED ORDER — LABETALOL HCL 200 MG PO TABS: 500.0000 mg | ORAL_TABLET | Freq: Two times a day (BID) | ORAL | Status: DC

## 2015-08-22 NOTE — Patient Instructions (Signed)
   Your physician has requested that you regularly monitor and record your blood pressure readings at home. Please take your readings approximately 2 hours after medication.  Keep readings x 1 week & return to office for MD review.    Increase Labetalol to 500mg  twice a day   Continue all other medications.   Follow up in  2-3 weeks.

## 2015-08-22 NOTE — Progress Notes (Addendum)
Patient ID: [name], male   DOB: 1950/10/04, 65 y.o.   MRN: [id number]     Clinical Summary [name] is a 65 y.o.male seen today for follow up of the following medical problems. This is a focused visit on recent edema and HTN.    1. HTN  - checks at home occasionally, typically 180/120s. typically around 130s/80s - compliant with meds   - BP log submitted Jan 20,2017 with SBPs 150-200s. He was started on hydrlazine 25mg  tid. Around that time nephrology changed HCTZ to lasix.  Hydralazine was increased over follow up bp logs due to ongoing HTN, increased to 100mg  tid.  - 07/13/15 nursing visit for bp check 160/80. Coreg was changed to labetolol 400mg  bid at that time.  - 07/2015 started on isosorbide by NP Purcell Nails for ongoing HTN.  - last labs 08/07/15: Cr 1.81 (baseline 1.8-20, K 3.9 - it appears at some point his norvasc 10mg  was stopped, from review of our records I don't see it was one of our providers.   Secondary HTN consideration OSA: home test about 4-5 years he reports normal Renal artery [place]: never tests Renin/Aldo: never tested EtOH NSAIDs Tobacco    2. Acute on chronic diastolic hear failure - [id number] echo LVEF 60-65%, no WMAs, abnormal diastolic function indeterminate grade.  - recently changed from lasix to toresmide. Weight trending down.  - he is on torsemide 20mg  bid. Weight today is 250 lbs, down from 267 lbs. Home weights at 245 lbs - swelling is improving but not resolved. Of note has had some evidence of LE venous reflux as well.   Past Medical History  Diagnosis Date  . CAD (coronary artery disease)     multivessel  . Two-vessel coronary artery disease     moderate. by cath in 2003. Status post stenting of the mdi RCA November 1999 normal left ventricular ejection fraction  . Dyslipidemia   . DM2 (diabetes mellitus, type 2) ([place])   . HTN (hypertension)   . COPD (chronic obstructive pulmonary disease) ([place])     with ongoing tobacco use and  patient failed sham takes  . Morbid obesity ([place])   . 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 Chronic renal insufficiency   . Reflux esophagitis     hx  . 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   . Renal insufficiency      No Known Allergies   Current Outpatient Prescriptions  Medication Sig Dispense Refill  . albuterol (VENTOLIN HFA) 108 (90 BASE) MCG/ACT inhaler Inhale 2 puffs into the lungs every 6 (six) hours as needed.    Marland Kitchen amLODipine (NORVASC) 10 MG tablet Take 10 mg by mouth daily.      Marland Kitchen aspirin 81 MG tablet Take 81 mg by mouth daily.      . cholecalciferol (VITAMIN D) 1000 UNITS tablet Take 1,000 Units by mouth daily.    . clonazePAM (KLONOPIN) 0.5 MG tablet Take 0.5 mg by mouth daily.     . hydrALAZINE (APRESOLINE) 100 MG tablet Take 100 mg by mouth 3 (three) times daily.    . insulin glargine (LANTUS) 100 UNIT/ML injection Inject 20 Units into the skin every morning.     . isosorbide mononitrate (IMDUR) 30 MG 24 hr tablet Take 0.5 tablets (15 mg total) by mouth daily. 90 tablet 3  . labetalol (NORMODYNE) 200 MG  tablet Take 2 tablets (400 mg total) by mouth 2 (two) times daily. 120 tablet 3  . Multiple Vitamin (MULTIVITAMIN) tablet Take 1 tablet by mouth daily.      Marland Kitchen omeprazole (PRILOSEC) 20 MG capsule Take 20 mg by mouth daily.      . ramipril (ALTACE) 10 MG tablet Take 10 mg by mouth daily.      . rosuvastatin (CRESTOR) 20 MG tablet Take 1 tablet (20 mg total) by mouth daily. 30 tablet 6  . sildenafil (REVATIO) 20 MG tablet Take 2 tablets by mouth daily as needed.  5  . torsemide (DEMADEX) 20 MG tablet Take 20 mg two times per day for the next three days, then take 20 mg (1 tablet daily) thereafter 36 tablet 3  . TRULICITY 1.5 0000000 SOPN INJECT 1.5 MG SUBCUTANEOUSLY ONCE A WEEK  11  . zolpidem (AMBIEN) 10 MG tablet Take 10 mg by mouth daily as  needed.  5   No current facility-administered medications for this visit.     Past Surgical History  Procedure Laterality Date  . Back surgery    . Abdominal surgery    . Diabetic ulcers       No Known Allergies    Family History  Problem Relation Age of Onset  . Diabetes Mother   . Alcohol abuse Father      Social History [name] reports that he has been smoking Cigarettes.  He started smoking about 47 years ago. He has a 40 pack-year smoking history. He has never used smokeless tobacco. [name] reports that he does not drink alcohol.   Review of Systems CONSTITUTIONAL: No weight loss, fever, chills, weakness or fatigue.  HEENT: Eyes: No visual loss, blurred vision, double vision or yellow sclerae.No hearing loss, sneezing, congestion, runny nose or sore throat.  SKIN: No rash or itching.  CARDIOVASCULAR: no chest pain, no palpitations RESPIRATORY: No shortness of breath, cough or sputum.  GASTROINTESTINAL: No anorexia, nausea, vomiting or diarrhea. No abdominal pain or blood.  GENITOURINARY: No burning on urination, no polyuria NEUROLOGICAL: No headache, dizziness, syncope, paralysis, ataxia, numbness or tingling in the extremities. No change in bowel or bladder control.  MUSCULOSKELETAL: No muscle, back pain, joint pain or stiffness.  LYMPHATICS: No enlarged nodes. No history of splenectomy.  PSYCHIATRIC: No history of depression or anxiety.  ENDOCRINOLOGIC: No reports of sweating, cold or heat intolerance. No polyuria or polydipsia.  Marland Kitchen   Physical Examination Filed Vitals:   08/22/15 0953  BP: 197/85  Pulse: 61   Filed Vitals:   08/22/15 0953  Height: 6\' 3"  (1.905 m)  Weight: 250 lb (113.399 kg)    Gen: resting comfortably, no acute distress HEENT: no scleral icterus, pupils equal round and reactive, no palptable cervical adenopathy,  CV: RRR, no m/r/g, no jvd Resp: Clear to auscultation bilaterally GI: abdomen is soft, non-tender, non-distended,  normal bowel sounds, no hepatosplenomegaly MSK: extremities are warm, 1+ bilateral LE edema Skin: warm, no rash Neuro:  no focal deficits Psych: appropriate affect   Diagnostic Studies 05/2001 Cath  FINDINGS:  1. Left main trunk: Medium caliber vessel. This is a long vessel with a  distal taper of 20-30%.  2. Left anterior descending artery: This is a medium caliber vessel that  provides a large bifurcating first diagonal branch in the proximal segment  and before then extending to the apex, the LAD tapers significantly after  the diagonal branch. There is moderate disease of 50-60% after the  diagonal  branch and then a focal eccentric narrowing of 60-70% in the mid  section of the LAD. The diagonal branch has moderate disease of 30-40% in  the proximal segment. The lateral division is a small caliber vessel with  an ostial narrowing of 60%.  3. Left circumflex artery: This is a small caliber vessel that provides a  trivial first marginal branch in the mid section and a medium caliber  second marginal branch distally. The mid AV circumflex at the takeoff of  the first and second diagonal branches had moderate diffuse disease of  60-70%.  4. Right coronary artery: Dominant. This is a large caliber vessel that  provides a posterior descending artery and two posteroventricular branches  in the terminal segment. The right coronary artery has evidence of an  existing stent in the mid section that is widely patent. Prior to stent is  a focal discrete narrowing of 40-50%. Distal to the stent is moderate  disease of 30%. The distal branch vessels also have mild disease of 30%.  5. Left ventricle: Normal end-systolic and end-diastolic dimensions. Overall  left ventricular function is well preserved. Ejection fraction is greater  than 65%. No mitral regurgitation. LV pressure is 180/10, aortic is  180/90. LV EDP equals 20.  ASSESSMENT AND PLAN: [name] is a 65 year old gentleman with  moderate  three-vessel coronary artery disease. The patients plaque burden in the left  anterior descending artery and circumflex appears to have increased somewhat  compared to his previous angiogram. Unfortunately, the mid and distal left  anterior descending artery is a small caliber vessel with a long diffuse  segment of disease and would be associated with a high restenosis rate.  Similarly, the circumflex would suffer the same fate.  Further assessment with a stress imaging study would be prudent to isolate the  area of ischemia. Percutaneous intervention may then be targeted if  indicated. In addition, aggressive medical therapy should be pursued as the  patient currently is not on any anti-anginal therapy and has poorly controlled  hypertension. The patient will also be counseled regarding smoking cessation.    07/2013 PFTs  Mild obstruction    10/2014 Lexiscan  Unable to adequately ambulate to treadmill due to right hip pain. Lexiscan employed.  Defect 1: There is a small defect of mild severity present in the mid inferior and apical inferior location. This defect is partially reversible.  This is a low risk study.  Nuclear stress EF: 58%.  Small region of apical inferior ischemia       Assessment and Plan  1. HTN - bp's elevated, we will increase labetlol to 500mg  bid - he will submit bp log in 1 week - bp also should improve as he continues to diurese  2. Acute on chronic diastolic HF - weight trending down well, has some well with change to torsemide - we will continue current diuretics  3. AAA Noted by abdominal CT scan 10/2011, needs f/u.   F/u 2-3 weeks    Arnoldo Lenis, M.D.

## 2015-08-24 ENCOUNTER — Other Ambulatory Visit: Payer: Self-pay | Admitting: Physician Assistant

## 2015-08-24 ENCOUNTER — Other Ambulatory Visit: Payer: Self-pay | Admitting: Radiology

## 2015-08-25 ENCOUNTER — Ambulatory Visit (HOSPITAL_COMMUNITY)
Admission: RE | Admit: 2015-08-25 | Discharge: 2015-08-25 | Disposition: A | Discharge status: Home / Self Care | Payer: Medicare Other | Source: Ambulatory Visit | Attending: Nephrology | Admitting: Nephrology

## 2015-08-25 DIAGNOSIS — F17210 Nicotine dependence, cigarettes, uncomplicated: Secondary | ICD-10-CM | POA: Insufficient documentation

## 2015-08-25 DIAGNOSIS — Z833 Family history of diabetes mellitus: Secondary | ICD-10-CM | POA: Insufficient documentation

## 2015-08-25 DIAGNOSIS — J449 Chronic obstructive pulmonary disease, unspecified: Secondary | ICD-10-CM | POA: Diagnosis not present

## 2015-08-25 DIAGNOSIS — I129 Hypertensive chronic kidney disease with stage 1 through stage 4 chronic kidney disease, or unspecified chronic kidney disease: Secondary | ICD-10-CM | POA: Insufficient documentation

## 2015-08-25 DIAGNOSIS — E1122 Type 2 diabetes mellitus with diabetic chronic kidney disease: Secondary | ICD-10-CM | POA: Insufficient documentation

## 2015-08-25 DIAGNOSIS — I2510 Atherosclerotic heart disease of native coronary artery without angina pectoris: Secondary | ICD-10-CM | POA: Diagnosis not present

## 2015-08-25 DIAGNOSIS — Z79899 Other long term (current) drug therapy: Secondary | ICD-10-CM | POA: Insufficient documentation

## 2015-08-25 DIAGNOSIS — Z7984 Long term (current) use of oral hypoglycemic drugs: Secondary | ICD-10-CM | POA: Diagnosis not present

## 2015-08-25 DIAGNOSIS — E785 Hyperlipidemia, unspecified: Secondary | ICD-10-CM | POA: Insufficient documentation

## 2015-08-25 DIAGNOSIS — I872 Venous insufficiency (chronic) (peripheral): Secondary | ICD-10-CM | POA: Insufficient documentation

## 2015-08-25 DIAGNOSIS — I739 Peripheral vascular disease, unspecified: Secondary | ICD-10-CM | POA: Insufficient documentation

## 2015-08-25 DIAGNOSIS — R809 Proteinuria, unspecified: Secondary | ICD-10-CM | POA: Diagnosis not present

## 2015-08-25 DIAGNOSIS — N189 Chronic kidney disease, unspecified: Secondary | ICD-10-CM | POA: Diagnosis not present

## 2015-08-25 DIAGNOSIS — Z7982 Long term (current) use of aspirin: Secondary | ICD-10-CM | POA: Insufficient documentation

## 2015-08-25 LAB — APTT: aPTT: 38 seconds — ABNORMAL HIGH (ref 24–37)

## 2015-08-25 LAB — PROTIME-INR
INR: 1.04 (ref 0.00–1.49)
Prothrombin Time: 13.8 seconds (ref 11.6–15.2)

## 2015-08-25 LAB — CBC
HCT: 45.7 % (ref 39.0–52.0)
Hemoglobin: 14.8 g/dL (ref 13.0–17.0)
MCH: 30.5 pg (ref 26.0–34.0)
MCHC: 32.4 g/dL (ref 30.0–36.0)
MCV: 94.2 fL (ref 78.0–100.0)
Platelets: 244 10*3/uL (ref 150–400)
RBC: 4.85 MIL/uL (ref 4.22–5.81)
RDW: 15.7 % — ABNORMAL HIGH (ref 11.5–15.5)
WBC: 6.9 10*3/uL (ref 4.0–10.5)

## 2015-08-25 LAB — GLUCOSE, CAPILLARY: Glucose-Capillary: 101 mg/dL — ABNORMAL HIGH (ref 65–99)

## 2015-08-25 MED ORDER — FENTANYL CITRATE (PF) 100 MCG/2ML IJ SOLN
INTRAMUSCULAR | Status: AC
  Filled 2015-08-25: qty 2

## 2015-08-25 MED ORDER — SODIUM CHLORIDE 0.9 % IV SOLN
INTRAVENOUS | Status: AC | PRN
  Administered 2015-08-25: 10 mL/h via INTRAVENOUS

## 2015-08-25 MED ORDER — SODIUM CHLORIDE 0.9 % IV SOLN: INTRAVENOUS | Status: DC

## 2015-08-25 MED ORDER — HYDRALAZINE HCL 20 MG/ML IJ SOLN
INTRAMUSCULAR | Status: AC
  Filled 2015-08-25: qty 1

## 2015-08-25 MED ORDER — HYDROCODONE-ACETAMINOPHEN 5-325 MG PO TABS: 1.0000 | ORAL_TABLET | ORAL | Status: DC | PRN

## 2015-08-25 MED ORDER — MIDAZOLAM HCL 2 MG/2ML IJ SOLN
INTRAMUSCULAR | Status: AC
  Filled 2015-08-25: qty 2

## 2015-08-25 MED ORDER — MIDAZOLAM HCL 2 MG/2ML IJ SOLN
INTRAMUSCULAR | Status: AC | PRN
  Administered 2015-08-25: 1 mg via INTRAVENOUS
  Administered 2015-08-25: 0.5 mg via INTRAVENOUS

## 2015-08-25 MED ORDER — FENTANYL CITRATE (PF) 100 MCG/2ML IJ SOLN
INTRAMUSCULAR | Status: AC | PRN
  Administered 2015-08-25: 50 ug via INTRAVENOUS

## 2015-08-25 MED ORDER — HYDRALAZINE HCL 20 MG/ML IJ SOLN
INTRAMUSCULAR | Status: AC | PRN
  Administered 2015-08-25: 10 mg via INTRAVENOUS

## 2015-08-25 MED ORDER — LIDOCAINE HCL (PF) 1 % IJ SOLN
INTRAMUSCULAR | Status: AC
  Filled 2015-08-25: qty 10

## 2015-08-25 NOTE — H&P (Signed)
Chief Complaint: Patient was seen in consultation today for random renal biopsy at the request of [facility]  Referring Physician(s): Fran Lowes  Supervising Physician: Markus Daft  History of Present Illness: [name] is a 65 y.o. male with proteinuria and mild renal insufficiency. He is referred for random renal biopsy He also has HTN which he has had more recent difficulty with control. He did not take his home meds this am yet. Has been NPO this am. Wife at bedside.  Past Medical History  Diagnosis Date  . CAD (coronary artery disease)     multivessel  . Two-vessel coronary artery disease     moderate. by cath in 2003. Status post stenting of the mdi RCA November 1999 normal left ventricular ejection fraction  . Dyslipidemia   . DM2 (diabetes mellitus, type 2) ([place])   . HTN (hypertension)   . COPD (chronic obstructive pulmonary disease) ([place])     with ongoing tobacco use and patient failed sham takes  . Morbid obesity ([place])   . 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 Chronic renal insufficiency   . Reflux esophagitis     hx  . 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   . Renal insufficiency     Past Surgical History  Procedure Laterality Date  . Back surgery    . Abdominal surgery    . Diabetic ulcers      Allergies: Review of patient's allergies indicates no known allergies.  Medications: Prior to Admission medications   Medication Sig Start Date End Date Taking? Authorizing Provider  aspirin 81 MG tablet Take 81 mg by mouth daily. On hold since 08/08/15   Yes Historical Provider, MD  cholecalciferol (VITAMIN D) 1000 UNITS tablet Take 1,000 Units by mouth daily.   Yes Historical Provider, MD  clonazePAM (KLONOPIN) 0.5 MG tablet Take 0.5 mg by mouth daily.    Yes Historical Provider, MD  hydrALAZINE (APRESOLINE) 100  MG tablet Take 100 mg by mouth 3 (three) times daily.   Yes Historical Provider, MD  insulin glargine (LANTUS) 100 UNIT/ML injection Inject 20 Units into the skin every morning.    Yes Historical Provider, MD  isosorbide mononitrate (IMDUR) 30 MG 24 hr tablet Take 30 mg by mouth daily. 07/28/15  Yes Historical Provider, MD  labetalol (NORMODYNE) 200 MG tablet Take 2.5 tablets (500 mg total) by mouth 2 (two) times daily. 08/22/15  Yes Arnoldo Lenis, MD  LINZESS 290 MCG CAPS capsule Take 290 mcg by mouth daily as needed (for constipation).  07/26/15  Yes Historical Provider, MD  Multiple Vitamin (MULTIVITAMIN) tablet Take 1 tablet by mouth daily.     Yes Historical Provider, MD  naproxen sodium (ANAPROX) 220 MG tablet Take 440 mg by mouth daily as needed (for pain).   Yes Historical Provider, MD  omeprazole (PRILOSEC) 20 MG capsule Take 20 mg by mouth daily.     Yes Historical Provider, MD  potassium chloride (K-DUR,KLOR-CON) 10 MEQ tablet Take 10 mEq by mouth daily. 08/16/15  Yes Historical Provider, MD  ramipril (ALTACE) 10 MG tablet Take 10 mg by mouth daily.     Yes Historical Provider, MD  rosuvastatin (CRESTOR) 20 MG tablet Take 1 tablet (20 mg total) by mouth daily. 06/20/15  Yes Arnoldo Lenis, MD  sildenafil (REVATIO) 20 MG tablet Take 2 tablets by mouth daily as  needed. 06/05/15  Yes Historical Provider, MD  torsemide (DEMADEX) 20 MG tablet Take 20 mg two times per day for the next three days, then take 20 mg (1 tablet daily) thereafter Patient taking differently: Take 20 mg by mouth daily.  07/31/15  Yes Lendon Colonel, NP  TRULICITY 1.5 0000000 SOPN INJECT 1.5 MG SUBCUTANEOUSLY ONCE every Wednesday 04/12/15  Yes Historical Provider, MD  zolpidem (AMBIEN) 10 MG tablet Take 10 mg by mouth daily as needed for sleep.  06/19/15  Yes Historical Provider, MD  albuterol (VENTOLIN HFA) 108 (90 BASE) MCG/ACT inhaler Inhale 2 puffs into the lungs every 6 (six) hours as needed for wheezing or  shortness of breath.     Historical Provider, MD     Family History  Problem Relation Age of Onset  . Diabetes Mother   . Alcohol abuse Father     Social History   Social History  . Marital Status: Single    Spouse Name: N/A  . Number of Children: N/A  . Years of Education: N/A   Social History Main Topics  . Smoking status: Current Every Day Smoker -- 1.00 packs/day for 40 years    Types: Cigarettes    Start date: 05/20/1968  . Smokeless tobacco: Never Used  . Alcohol Use: No  . Drug Use: No  . Sexual Activity: Not on file   Other Topics Concern  . Not on file   Social History Narrative   Patient continues to smoke.      Review of Systems: A 12 point ROS discussed and pertinent positives are indicated in the HPI above.  All other systems are negative.  Review of Systems  Vital Signs: BP 166/74 mmHg  Pulse 60  Temp(Src) 97.7 F (36.5 C) (Oral)  Resp 18  Ht 6\' 4"  (1.93 m)  Wt 250 lb (113.399 kg)  BMI 30.44 kg/m2  SpO2 97%  Physical Exam  Constitutional: He is oriented to person, place, and time. He appears well-developed and well-nourished. No distress.  HENT:  Head: Normocephalic.  Mouth/Throat: Oropharynx is clear and moist.  Neck: Normal range of motion. No tracheal deviation present.  Cardiovascular: Normal rate, regular rhythm and normal heart sounds.   Pulmonary/Chest: Effort normal and breath sounds normal. No respiratory distress.  Abdominal: Soft. Bowel sounds are normal. There is no tenderness.  Neurological: He is alert and oriented to person, place, and time.  Psychiatric: He has a normal mood and affect. Judgment normal.    Mallampati Score:  MD Evaluation Airway: WNL Heart: WNL Abdomen: WNL Chest/ Lungs: WNL ASA  Classification: 2 Mallampati/Airway Score: One  Imaging: Dg Chest 2 View  07/31/2015  CLINICAL DATA:  Lower extremity swelling. Shortness of breath. Chest pressure. Productive cough and congestion. Smoker. Weight gain.  EXAM: CHEST  2 VIEW COMPARISON:  09/26/2014 FINDINGS: Hyperinflation. Remote right rib trauma. Midline trachea. Mild cardiomegaly with aortic atherosclerosis. New trace bilateral pleural effusions. No pneumothorax. Progressive pulmonary interstitial prominence. No lobar consolidation. IMPRESSION: New bilateral pleural effusions with progressive pulmonary interstitial thickening. Findings are suspicious for mild interstitial edema, superimposed upon COPD/ chronic bronchitis. Mild cardiomegaly with transverse aortic atherosclerosis. Electronically Signed   By: Abigail Miyamoto M.D.   On: 07/31/2015 14:26    Labs:  CBC:  Recent Labs  09/26/14 2143 09/27/14 0558 08/25/15 0645  WBC 10.0 10.0 6.9  HGB 16.4 15.9 14.8  HCT 47.3 46.4 45.7  PLT 170 153 244    COAGS: No results for input(s): INR, APTT in  the last 8760 hours.  BMP:  Recent Labs  09/26/14 2143 09/27/14 0558 09/28/14 0415 07/28/15 1606 08/07/15 0935  NA 134* 136 139 142 144  K 2.8* 3.5 3.3* 5.0 3.9  CL 97* 100* 102 105 101  CO2 27 27 30  32* 27  GLUCOSE 102* 110* 97 59* 87  BUN 38* 32* 26* 19 22  CALCIUM 8.8* 8.3* 8.3* 8.7 8.9  CREATININE 2.26* 2.02* 1.86* 2.01* 1.81*  GFRNONAA 30* 34* 37*  --  39*  GFRAA 34* 39* 43*  --  45*    LIVER FUNCTION TESTS:  Recent Labs  09/26/14 2143 09/27/14 0558  BILITOT 0.9 1.0  AST 18 15  ALT 12* 11*  ALKPHOS 51 45  PROT 7.4 6.7  ALBUMIN 3.7 3.3*    TUMOR MARKERS: No results for input(s): AFPTM, CEA, CA199, CHROMGRNA in the last 8760 hours.  Assessment and Plan: Proteinuria CKD For US guided random renal biopsy SBP a little high this am. Will have pt take home BP meds while here. Labs pending Risks and Benefits discussed with the patient including, but not limited to bleeding, infection, damage to adjacent structures or low yield requiring additional tests. All of the patient's questions were answered, patient is agreeable to proceed. Consent signed and in chart.  Keep  pt 4 hrs for observation  Thank you for this interesting consult.  I greatly enjoyed meeting [name] and look forward to participating in their care.  A copy of this report was sent to the requesting provider on this date.  Electronically Signed: Ascencion Dike 08/25/2015, 7:55 AM   I spent a total of 20 minutes in face to face in clinical consultation, greater than 50% of which was counseling/coordinating care for random renal biopsy

## 2015-08-25 NOTE — Procedures (Signed)
US guided biopsy of left kidney lower pole. 2 - 16 gauge cores obtained.  No immediate complication.  No evidence for bleeding or hematoma after biopsy.  Plan for bedrest for 4 hours prior to discharge.

## 2015-08-25 NOTE — Discharge Instructions (Signed)
Kidney Biopsy, Care After Refer to this sheet in the next few weeks. These instructions provide you with information on caring for yourself after your procedure. Your health care provider may also give you more specific instructions. Your treatment has been planned according to current medical practices, but problems sometimes occur. Call your health care provider if you have any problems or questions after your procedure.  WHAT TO EXPECT AFTER THE PROCEDURE   You may notice blood in the urine for the first 24 hours after the biopsy.  You may feel some pain at the biopsy site for 1-2 weeks after the biopsy. HOME CARE INSTRUCTIONS  Do not lift anything heavier than 10 lb (4.5 kg) for 2 weeks.  Do not take any non-steroidal anti-inflammatory drugs (NSAIDs) or any blood thinners for a week after the biopsy unless instructed to do so by your health care provider.  Only take medicines for pain, fever, or discomfort as directed by your health care provider. SEEK MEDICAL CARE IF:  You have bloody urine more than 24 hours after the biopsy.   You develop a fever.   You cannot urinate.   You have increasing pain at the biopsy site.  SEEK IMMEDIATE MEDICAL CARE IF: You feel faint or dizzy.    This information is not intended to replace advice given to you by your health care provider. Make sure you discuss any questions you have with your health care provider.   Document Released: 01/06/2013 Document Reviewed: 01/06/2013 Elsevier Interactive Patient Education 2016 [employer] Kidney Biopsy, Care After Refer to this sheet in the next few weeks. These instructions provide you with information on caring for yourself after your procedure. Your health care provider may also give you more specific instructions. Your treatment has been planned according to current medical practices, but problems sometimes occur. Call your health care provider if you have any problems or questions after your  procedure.  WHAT TO EXPECT AFTER THE PROCEDURE  You may notice blood in the urine for the first 24 hours after the biopsy. You may feel some pain at the biopsy site for 1-2 weeks after the biopsy. HOME CARE INSTRUCTIONS Do not lift anything heavier than 10 lb (4.5 kg) for 2 weeks. Do not take any non-steroidal anti-inflammatory drugs (NSAIDs) or any blood thinners for a week after the biopsy unless instructed to do so by your health care provider. Only take medicines for pain, fever, or discomfort as directed by your health care provider. SEEK MEDICAL CARE IF: You have bloody urine more than 24 hours after the biopsy.  You develop a fever.  You cannot urinate.  You have increasing pain at the biopsy site.  SEEK IMMEDIATE MEDICAL CARE IF: You feel faint or dizzy.    This information is not intended to replace advice given to you by your health care provider. Make sure you discuss any questions you have with your health care provider.   Document Released: 01/06/2013 Document Reviewed: 01/06/2013 Elsevier Interactive Patient Education [employer].

## 2015-08-31 ENCOUNTER — Other Ambulatory Visit: Payer: Self-pay | Admitting: *Deleted

## 2015-08-31 MED ORDER — AMLODIPINE BESYLATE 10 MG PO TABS: 10.0000 mg | ORAL_TABLET | Freq: Every day | ORAL | Status: DC

## 2015-08-31 MED ORDER — LABETALOL HCL 200 MG PO TABS: 500.0000 mg | ORAL_TABLET | Freq: Two times a day (BID) | ORAL | Status: DC

## 2015-08-31 NOTE — Telephone Encounter (Signed)
Arnoldo Lenis, MD  Massie Maroon, CMA           Can we put Rx for norvasc 10mg  daily and also labetaol 500mg  bid to the patient's pharmacy please and call and let him know when its done    Zandra Abts MD      Pt aware and amlodipine and labetalol sent to pharmacy

## 2015-09-04 ENCOUNTER — Encounter (HOSPITAL_COMMUNITY): Payer: Self-pay

## 2015-09-05 DIAGNOSIS — E1142 Type 2 diabetes mellitus with diabetic polyneuropathy: Secondary | ICD-10-CM | POA: Diagnosis not present

## 2015-09-05 DIAGNOSIS — L97521 Non-pressure chronic ulcer of other part of left foot limited to breakdown of skin: Secondary | ICD-10-CM | POA: Diagnosis not present

## 2015-09-06 DIAGNOSIS — N179 Acute kidney failure, unspecified: Secondary | ICD-10-CM | POA: Diagnosis not present

## 2015-09-06 DIAGNOSIS — R809 Proteinuria, unspecified: Secondary | ICD-10-CM | POA: Diagnosis not present

## 2015-09-06 DIAGNOSIS — I10 Essential (primary) hypertension: Secondary | ICD-10-CM | POA: Diagnosis not present

## 2015-09-06 DIAGNOSIS — Z79899 Other long term (current) drug therapy: Secondary | ICD-10-CM | POA: Diagnosis not present

## 2015-09-06 DIAGNOSIS — N183 Chronic kidney disease, stage 3 (moderate): Secondary | ICD-10-CM | POA: Diagnosis not present

## 2015-09-06 DIAGNOSIS — R609 Edema, unspecified: Secondary | ICD-10-CM | POA: Diagnosis not present

## 2015-09-07 ENCOUNTER — Encounter (HOSPITAL_COMMUNITY): Payer: Self-pay

## 2015-09-07 ENCOUNTER — Encounter: Payer: Self-pay | Admitting: Cardiology

## 2015-09-07 ENCOUNTER — Ambulatory Visit (INDEPENDENT_AMBULATORY_CARE_PROVIDER_SITE_OTHER): Payer: Medicare Other | Admitting: Cardiology

## 2015-09-07 VITALS — BP 132/70 | HR 71 | Ht 76.0 in | Wt 247.0 lb

## 2015-09-07 DIAGNOSIS — I714 Abdominal aortic aneurysm, without rupture, unspecified: Secondary | ICD-10-CM

## 2015-09-07 DIAGNOSIS — I10 Essential (primary) hypertension: Secondary | ICD-10-CM

## 2015-09-07 DIAGNOSIS — I2510 Atherosclerotic heart disease of native coronary artery without angina pectoris: Secondary | ICD-10-CM | POA: Diagnosis not present

## 2015-09-07 NOTE — Patient Instructions (Signed)
Your physician recommends that you schedule a follow-up appointment in: [place] in 2 months with Dr [name]  Take Ramipril at bedtime  Keep BP log  For 1 week   Your physician has requested that you have an abdominal aorta duplex. During this test, an ultrasound is used to evaluate the aorta. Allow 30 minutes for this exam. Do not eat after midnight the day before and avoid carbonated beverages    Thank you for choosing [facility] !

## 2015-09-07 NOTE — Progress Notes (Signed)
Patient ID: ORIS CUSATO, male   DOB: 1951/05/16, 65 y.o.   MRN: [id number]     Clinical Summary Mr. Vanderkooi is a 65 y.o.male seen today for a focused visit for recent troubles with HTN.   1. HTN  - BP log submitted Jan 20,2017 with SBPs 150-200s. He was started on hydrlazine 25mg  tid. Around that time nephrology changed HCTZ to lasix. Hydralazine was increased over follow up bp logs due to ongoing HTN, increased to 100mg  tid.  - 07/13/15 nursing visit for bp check 160/80. Coreg was changed to labetolol 400mg  bid at that time.  - 07/2015 started on isosorbide by NP Purcell Nails for ongoing HTN.  - last labs 08/07/15: Cr 1.81 (baseline 1.8-20, K 3.9 - it appears at some point his norvasc 10mg  was stopped,we looked to restart it however patient reports nephrology did not recommend it due to his kidney disease.   - last visit we increased his labetalol to 500mg  bid.  - bp log today shows bps elevated in mornnings around 9AM typically SBP 170-180s, later in the day bp around 3pm shows typically 110s/70s. He reports some dizziness/lightheadness in the affternoons after taking all of his medications. Heart rates 60-70s by vitals.   Secondary HTN consideration OSA: home test about 4-5 years he reports normal Renal artery [place]: never tests Renin/Aldo: never tested EtOH NSAIDs Tobacco     Past Medical History  Diagnosis Date  . CAD (coronary artery disease)     multivessel  . Two-vessel coronary artery disease     moderate. by cath in 2003. Status post stenting of the mdi RCA November 1999 normal left ventricular ejection fraction  . Dyslipidemia   . DM2 (diabetes mellitus, type 2) ([place])   . HTN (hypertension)   . COPD (chronic obstructive pulmonary disease) ([place])     with ongoing tobacco use and patient failed sham takes  . Morbid obesity ([place])   . 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 Chronic renal insufficiency   .  Reflux esophagitis     hx  . 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   . Renal insufficiency      No Known Allergies   Current Outpatient Prescriptions  Medication Sig Dispense Refill  . albuterol (VENTOLIN HFA) 108 (90 BASE) MCG/ACT inhaler Inhale 2 puffs into the lungs every 6 (six) hours as needed for wheezing or shortness of breath.     Marland Kitchen amLODipine (NORVASC) 10 MG tablet Take 1 tablet (10 mg total) by mouth daily. 90 tablet 3  . aspirin 81 MG tablet Take 81 mg by mouth daily. On hold since 08/08/15    . cholecalciferol (VITAMIN D) 1000 UNITS tablet Take 1,000 Units by mouth daily.    . clonazePAM (KLONOPIN) 0.5 MG tablet Take 0.5 mg by mouth daily.     . hydrALAZINE (APRESOLINE) 100 MG tablet Take 100 mg by mouth 3 (three) times daily.    . insulin glargine (LANTUS) 100 UNIT/ML injection Inject 20 Units into the skin every morning.     . isosorbide mononitrate (IMDUR) 30 MG 24 hr tablet Take 30 mg by mouth daily.  3  . labetalol (NORMODYNE) 200 MG tablet Take 2.5 tablets (500 mg total) by mouth 2 (two) times daily. 150 tablet 3  . LINZESS 290 MCG CAPS capsule Take 290 mcg by mouth daily as needed (for constipation).  6  . Multiple Vitamin (MULTIVITAMIN) tablet Take 1 tablet by mouth daily.      . naproxen sodium (ANAPROX) 220 MG tablet Take 440 mg by mouth daily as needed (for pain).    Marland Kitchen omeprazole (PRILOSEC) 20 MG capsule Take 20 mg by mouth daily.      . potassium chloride (K-DUR,KLOR-CON) 10 MEQ tablet Take 10 mEq by mouth daily.  3  . ramipril (ALTACE) 10 MG tablet Take 10 mg by mouth daily.      . rosuvastatin (CRESTOR) 20 MG tablet Take 1 tablet (20 mg total) by mouth daily. 30 tablet 6  . sildenafil (REVATIO) 20 MG tablet Take 2 tablets by mouth daily as needed.  5  . torsemide (DEMADEX) 20 MG tablet Take 20 mg two times per day for the next three days, then take 20 mg (1 tablet daily) thereafter (Patient taking  differently: Take 20 mg by mouth daily. ) 36 tablet 3  . TRULICITY 1.5 0000000 SOPN INJECT 1.5 MG SUBCUTANEOUSLY ONCE every Wednesday  11  . zolpidem (AMBIEN) 10 MG tablet Take 10 mg by mouth daily as needed for sleep.   5   No current facility-administered medications for this visit.     Past Surgical History  Procedure Laterality Date  . Back surgery    . Abdominal surgery    . Diabetic ulcers       No Known Allergies    Family History  Problem Relation Age of Onset  . Diabetes Mother   . Alcohol abuse Father      Social History Mr. Water reports that he has been smoking Cigarettes.  He started smoking about 47 years ago. He has a 40 pack-year smoking history. He has never used smokeless tobacco. Mr. Ingham reports that he does not drink alcohol.   Review of Systems CONSTITUTIONAL: No weight loss, fever, chills, weakness or fatigue.  HEENT: Eyes: No visual loss, blurred vision, double vision or yellow sclerae.No hearing loss, sneezing, congestion, runny nose or sore throat.  SKIN: No rash or itching.  CARDIOVASCULAR: no chest pain, no palpitations.  RESPIRATORY: No shortness of breath, cough or sputum.  GASTROINTESTINAL: No anorexia, nausea, vomiting or diarrhea. No abdominal pain or blood.  GENITOURINARY: No burning on urination, no polyuria NEUROLOGICAL: No headache, dizziness, syncope, paralysis, ataxia, numbness or tingling in the extremities. No change in bowel or bladder control.  MUSCULOSKELETAL: No muscle, back pain, joint pain or stiffness.  LYMPHATICS: No enlarged nodes. No history of splenectomy.  PSYCHIATRIC: No history of depression or anxiety.  ENDOCRINOLOGIC: No reports of sweating, cold or heat intolerance. No polyuria or polydipsia.  Marland Kitchen   Physical Examination Filed Vitals:   09/07/15 1059  BP: 132/70  Pulse: 71   Filed Vitals:   09/07/15 1059  Height: 6\' 4"  (1.93 m)  Weight: 247 lb (112.038 kg)    Gen: resting comfortably, no acute  distress HEENT: no scleral icterus, pupils equal round and reactive, no palptable cervical adenopathy,  CV: RRR, no m/r/g, no jvd Resp: Clear to auscultation bilaterally GI: abdomen is soft, non-tender, non-distended, normal bowel sounds, no hepatosplenomegaly MSK: extremities are warm, no edema.  Skin: warm, no rash Neuro:  no focal deficits Psych: appropriate affect   Diagnostic Studies 05/2001 Cath  FINDINGS:  1. Left main trunk: Medium caliber vessel. This is a long vessel with a  distal taper of 20-30%.  2. Left anterior descending artery: This is a medium caliber vessel that  provides a large bifurcating  first diagonal Lorriann Hansmann in the proximal segment  and before then extending to the apex, the LAD tapers significantly after  the diagonal Tawan Degroote. There is moderate disease of 50-60% after the  diagonal Shaniqua Guillot and then a focal eccentric narrowing of 60-70% in the mid  section of the LAD. The diagonal Wilkes Potvin has moderate disease of 30-40% in  the proximal segment. The lateral division is a small caliber vessel with  an ostial narrowing of 60%.  3. Left circumflex artery: This is a small caliber vessel that provides a  trivial first marginal Tomara Youngberg in the mid section and a medium caliber  second marginal Grantland Want distally. The mid AV circumflex at the takeoff of  the first and second diagonal branches had moderate diffuse disease of  60-70%.  4. Right coronary artery: Dominant. This is a large caliber vessel that  provides a posterior descending artery and two posteroventricular branches  in the terminal segment. The right coronary artery has evidence of an  existing stent in the mid section that is widely patent. Prior to stent is  a focal discrete narrowing of 40-50%. Distal to the stent is moderate  disease of 30%. The distal Richetta Cubillos vessels also have mild disease of 30%.  5. Left ventricle: Normal end-systolic and end-diastolic dimensions. Overall  left  ventricular function is well preserved. Ejection fraction is greater  than 65%. No mitral regurgitation. LV pressure is 180/10, aortic is  180/90. LV EDP equals 20.  ASSESSMENT AND PLAN: Mr. Trimarco is a 65 year old gentleman with moderate  three-vessel coronary artery disease. The patients plaque burden in the left  anterior descending artery and circumflex appears to have increased somewhat  compared to his previous angiogram. Unfortunately, the mid and distal left  anterior descending artery is a small caliber vessel with a long diffuse  segment of disease and would be associated with a high restenosis rate.  Similarly, the circumflex would suffer the same fate.  Further assessment with a stress imaging study would be prudent to isolate the  area of ischemia. Percutaneous intervention may then be targeted if  indicated. In addition, aggressive medical therapy should be pursued as the  patient currently is not on any anti-anginal therapy and has poorly controlled  hypertension. The patient will also be counseled regarding smoking cessation.    07/2013 PFTs  Mild obstruction    10/2014 Lexiscan   Unable to adequately ambulate to treadmill due to right hip pain. Lexiscan employed.   Defect 1: There is a small defect of mild severity present in the mid inferior and apical inferior location. This defect is partially reversible.   This is a low risk study.   Nuclear stress EF: 58%.   Small region of apical inferior ischemia    07/2015 Echo Study Conclusions  - Left ventricle: The cavity size was normal. Wall thickness was  increased increased in a pattern of mild to moderate LVH.  Systolic function was normal. The estimated ejection fraction was  in the range of 60% to 65%. Diastolic function is abnormal,  indeterminate grade. Wall motion was normal; there were no  regional wall motion abnormalities. - Aortic valve: Mildly calcified annulus. Trileaflet;  mildly  thickened leaflets. Valve area (VTI): 2.39 cm^2. Valve area  (Vmax): 2.58 cm^2. Valve area (Vmean): 2.7 cm^2. - Mitral valve: Mildly calcified annulus. Normal thickness leaflets  . - Technically adequate study.    Assessment and Plan  1. HTN - bp's elevated in AM, fairly rapid decline in the later  morning to early afternoon with SBP dropping some 60 points at times, I suspect this is whats causing his dizziness/fatgiue at that time of day. Will have him take ramipril at night to see if that smooths out his bp trends better. He is achieving goal bp's later in the day once his medicatons are on board - touch base with renal about there thoughts about norvasc, patient reports he was asked not to take.   2. AAA Noted by abdominal CT scan 10/2011, we will order AAA [place]  F/u 2 months    Arnoldo Lenis, M.D.

## 2015-09-13 ENCOUNTER — Ambulatory Visit (HOSPITAL_COMMUNITY)
Admission: RE | Admit: 2015-09-13 | Discharge: 2015-09-13 | Disposition: A | Discharge status: Home / Self Care | Payer: Medicare Other | Source: Ambulatory Visit | Attending: Cardiology | Admitting: Cardiology

## 2015-09-13 ENCOUNTER — Telehealth: Payer: Self-pay | Admitting: *Deleted

## 2015-09-13 DIAGNOSIS — I714 Abdominal aortic aneurysm, without rupture, unspecified: Secondary | ICD-10-CM

## 2015-09-13 DIAGNOSIS — I10 Essential (primary) hypertension: Secondary | ICD-10-CM | POA: Diagnosis not present

## 2015-09-13 DIAGNOSIS — N184 Chronic kidney disease, stage 4 (severe): Secondary | ICD-10-CM | POA: Diagnosis not present

## 2015-09-13 DIAGNOSIS — Z79899 Other long term (current) drug therapy: Secondary | ICD-10-CM | POA: Diagnosis not present

## 2015-09-13 NOTE — Telephone Encounter (Signed)
Pt aware, routed to pcp, recall placed

## 2015-09-13 NOTE — Telephone Encounter (Signed)
-----   Message from Arnoldo Lenis, MD sent at 09/13/2015 12:21 PM EDT ----- Abdominal US shows a very mild abdominal aortic aneurysm, we will need to repeat the [place] in 2 years. Nothing else new to do at this time  Zandra Abts MD

## 2015-09-15 ENCOUNTER — Telehealth: Payer: Self-pay | Admitting: Cardiology

## 2015-09-15 NOTE — Telephone Encounter (Signed)
BP log reviewed, spoke with patient. Elevated bp's in AM with SBP 160s to 170s, fairly rapid decline in bp after taking AM meds with bp around 3pm 100-120s, can feel fatigued and dizzy in the afternoons. He will change hydralazine to 100mg  bid, if symptoms continue over the weekend stop imdur. He is to call us Monday or Tuesday with update on bp's and symptoms.   Zandra Abts MD

## 2015-09-22 ENCOUNTER — Telehealth: Payer: Self-pay | Admitting: Cardiology

## 2015-09-22 NOTE — Telephone Encounter (Signed)
BP log reviewed, bp's 100-130s/60-80s. Overall look very good, no long with the extreme high bp's in the AM followed by the rapid decline in the afternoon. Symptoms of afternoon dizziness improved. Counseled if symptoms continue ok to try holding imdur and follow bp's and symptoms. He also reports some recent weight gain and edema, he is taking extra torsemide to help   J Jasiel Apachito MD

## 2015-09-26 DIAGNOSIS — E1142 Type 2 diabetes mellitus with diabetic polyneuropathy: Secondary | ICD-10-CM | POA: Diagnosis not present

## 2015-09-26 DIAGNOSIS — L97512 Non-pressure chronic ulcer of other part of right foot with fat layer exposed: Secondary | ICD-10-CM | POA: Diagnosis not present

## 2015-10-03 DIAGNOSIS — L97512 Non-pressure chronic ulcer of other part of right foot with fat layer exposed: Secondary | ICD-10-CM | POA: Diagnosis not present

## 2015-10-03 DIAGNOSIS — E1142 Type 2 diabetes mellitus with diabetic polyneuropathy: Secondary | ICD-10-CM | POA: Diagnosis not present

## 2015-11-03 DIAGNOSIS — Z79899 Other long term (current) drug therapy: Secondary | ICD-10-CM | POA: Diagnosis not present

## 2015-11-03 DIAGNOSIS — E559 Vitamin D deficiency, unspecified: Secondary | ICD-10-CM | POA: Diagnosis not present

## 2015-11-03 DIAGNOSIS — I10 Essential (primary) hypertension: Secondary | ICD-10-CM | POA: Diagnosis not present

## 2015-11-03 DIAGNOSIS — E1342 Other specified diabetes mellitus with diabetic polyneuropathy: Secondary | ICD-10-CM | POA: Diagnosis not present

## 2015-11-03 DIAGNOSIS — R809 Proteinuria, unspecified: Secondary | ICD-10-CM | POA: Diagnosis not present

## 2015-11-03 DIAGNOSIS — N183 Chronic kidney disease, stage 3 (moderate): Secondary | ICD-10-CM | POA: Diagnosis not present

## 2015-11-03 DIAGNOSIS — L97529 Non-pressure chronic ulcer of other part of left foot with unspecified severity: Secondary | ICD-10-CM | POA: Diagnosis not present

## 2015-11-03 DIAGNOSIS — D649 Anemia, unspecified: Secondary | ICD-10-CM | POA: Diagnosis not present

## 2015-11-08 DIAGNOSIS — R809 Proteinuria, unspecified: Secondary | ICD-10-CM | POA: Diagnosis not present

## 2015-11-08 DIAGNOSIS — E876 Hypokalemia: Secondary | ICD-10-CM | POA: Diagnosis not present

## 2015-11-08 DIAGNOSIS — I10 Essential (primary) hypertension: Secondary | ICD-10-CM | POA: Diagnosis not present

## 2015-11-08 DIAGNOSIS — N2581 Secondary hyperparathyroidism of renal origin: Secondary | ICD-10-CM | POA: Diagnosis not present

## 2015-11-08 DIAGNOSIS — N184 Chronic kidney disease, stage 4 (severe): Secondary | ICD-10-CM | POA: Diagnosis not present

## 2015-11-10 ENCOUNTER — Ambulatory Visit (INDEPENDENT_AMBULATORY_CARE_PROVIDER_SITE_OTHER): Payer: Medicare Other | Admitting: Cardiology

## 2015-11-10 ENCOUNTER — Encounter: Payer: Self-pay | Admitting: Cardiology

## 2015-11-10 VITALS — BP 129/72 | HR 65 | Ht 76.0 in | Wt 247.0 lb

## 2015-11-10 DIAGNOSIS — E785 Hyperlipidemia, unspecified: Secondary | ICD-10-CM

## 2015-11-10 DIAGNOSIS — I5032 Chronic diastolic (congestive) heart failure: Secondary | ICD-10-CM

## 2015-11-10 DIAGNOSIS — I10 Essential (primary) hypertension: Secondary | ICD-10-CM | POA: Diagnosis not present

## 2015-11-10 DIAGNOSIS — I2510 Atherosclerotic heart disease of native coronary artery without angina pectoris: Secondary | ICD-10-CM

## 2015-11-10 NOTE — Patient Instructions (Signed)
Your physician recommends that you schedule a follow-up appointment in: 4 MONTHS WITH DR BRANCH  Your physician recommends that you continue on your current medications as directed. Please refer to the Current Medication list given to you today.  Thank you for choosing [facility]!!    

## 2015-11-10 NOTE — Progress Notes (Signed)
Clinical Summary Mr. Deharo is a 65 y.o.male seen today for a focused visit for recent troubles with HTN.   1. HTN  - we had previous troubles getting bp contorlled. He would have very high bp's in the AM, then rapidly declining bp in the afternoons that were symptomatic after taking his medications.  - after changing his ramipril to night time this pattern resolved - home bp's typically 120s/70s   2. CAD  - prior stent to RCA in 1999. Last cath 05/2001 : LM 20-30%, LAD 50-60% And 60-70% mid, LCX with mid AV portion 60-70%, RCA with patent stent with proximal 40-50% disease. LVEF by LVgram 65%, LVEDP 20. Overall moderate lesions, not great anatomically for PCI per notes, treated medically.  - 10/2014 Lexiscan with small area of ischemia mid inferior to apical, overall low risk  - no recent chest pain or SOB  3. Hyperlipidemia  - compliant with crestor  - has labs upcoming with pcp   4. COPD - followed by pcp  5. CKD - followed by nephrology  6. Chronic diasotlic heart failure - has had some recent LE edema.  - weights are up and down.  - limiting sodium intake.     Past Medical History  Diagnosis Date  . CAD (coronary artery disease)     multivessel  . Two-vessel coronary artery disease     moderate. by cath in 2003. Status post stenting of the mdi RCA November 1999 normal left ventricular ejection fraction  . Dyslipidemia   . DM2 (diabetes mellitus, type 2) ([place])   . HTN (hypertension)   . COPD (chronic obstructive pulmonary disease) ([place])     with ongoing tobacco use and patient failed sham takes  . Morbid obesity ([place])   . 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 Chronic renal insufficiency   . Reflux esophagitis     hx  . 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   . Renal insufficiency      No Known Allergies   Current  Outpatient Prescriptions  Medication Sig Dispense Refill  . albuterol (VENTOLIN HFA) 108 (90 BASE) MCG/ACT inhaler Inhale 2 puffs into the lungs every 6 (six) hours as needed for wheezing or shortness of breath.     Marland Kitchen amLODipine (NORVASC) 10 MG tablet Take 1 tablet (10 mg total) by mouth daily. 90 tablet 3  . aspirin 81 MG tablet Take 81 mg by mouth daily. On hold since 08/08/15    . cholecalciferol (VITAMIN D) 1000 UNITS tablet Take 1,000 Units by mouth daily.    . clonazePAM (KLONOPIN) 0.5 MG tablet Take 0.5 mg by mouth daily.     . hydrALAZINE (APRESOLINE) 100 MG tablet Take 100 mg by mouth 3 (three) times daily.    . insulin glargine (LANTUS) 100 UNIT/ML injection Inject 20 Units into the skin every morning.     . isosorbide mononitrate (IMDUR) 30 MG 24 hr tablet Take 30 mg by mouth daily.  3  . labetalol (NORMODYNE) 200 MG tablet Take 2.5 tablets (500 mg total) by mouth 2 (two) times daily. 150 tablet 3  . LINZESS 290 MCG CAPS capsule Take 290 mcg by mouth daily as needed (for constipation).   6  . Multiple Vitamin (MULTIVITAMIN) tablet Take 1 tablet by mouth daily.      . naproxen sodium (ANAPROX) 220 MG  tablet Take 440 mg by mouth daily as needed (for pain).    Marland Kitchen omeprazole (PRILOSEC) 20 MG capsule Take 20 mg by mouth daily.      . potassium chloride (K-DUR,KLOR-CON) 10 MEQ tablet Take 10 mEq by mouth daily.  3  . ramipril (ALTACE) 10 MG tablet Take 10 mg by mouth at bedtime.     . rosuvastatin (CRESTOR) 20 MG tablet Take 1 tablet (20 mg total) by mouth daily. 30 tablet 6  . sildenafil (REVATIO) 20 MG tablet Take 2 tablets by mouth daily as needed.  5  . torsemide (DEMADEX) 20 MG tablet Take 20 mg two times per day for the next three days, then take 20 mg (1 tablet daily) thereafter (Patient taking differently: Take 20 mg by mouth daily. ) 36 tablet 3  . TRULICITY 1.5 0000000 SOPN INJECT 1.5 MG SUBCUTANEOUSLY ONCE every Wednesday  11  . zolpidem (AMBIEN) 10 MG tablet Take 10 mg by mouth  daily as needed for sleep.   5   No current facility-administered medications for this visit.     Past Surgical History  Procedure Laterality Date  . Back surgery    . Abdominal surgery    . Diabetic ulcers       No Known Allergies    Family History  Problem Relation Age of Onset  . Diabetes Mother   . Alcohol abuse Father      Social History Mr. Bernert reports that he has been smoking Cigarettes.  He started smoking about 47 years ago. He has a 40 pack-year smoking history. He has never used smokeless tobacco. Mr. Benevento reports that he does not drink alcohol.   Review of Systems CONSTITUTIONAL: No weight loss, fever, chills, weakness or fatigue.  HEENT: Eyes: No visual loss, blurred vision, double vision or yellow sclerae.No hearing loss, sneezing, congestion, runny nose or sore throat.  SKIN: No rash or itching.  CARDIOVASCULAR: per HPI RESPIRATORY: No shortness of breath, cough or sputum.  GASTROINTESTINAL: No anorexia, nausea, vomiting or diarrhea. No abdominal pain or blood.  GENITOURINARY: No burning on urination, no polyuria NEUROLOGICAL: No headache, dizziness, syncope, paralysis, ataxia, numbness or tingling in the extremities. No change in bowel or bladder control.  MUSCULOSKELETAL: No muscle, back pain, joint pain or stiffness.  LYMPHATICS: No enlarged nodes. No history of splenectomy.  PSYCHIATRIC: No history of depression or anxiety.  ENDOCRINOLOGIC: No reports of sweating, cold or heat intolerance. No polyuria or polydipsia.  Marland Kitchen   Physical Examination Filed Vitals:   11/10/15 0955  BP: 129/72  Pulse: 65   Filed Vitals:   11/10/15 0955  Height: 6\' 4"  (1.93 m)  Weight: 247 lb (112.038 kg)    Gen: resting comfortably, no acute distress HEENT: no scleral icterus, pupils equal round and reactive, no palptable cervical adenopathy,  CV: RRR, no m/r/g, nojvd Resp: Clear to auscultation bilaterally GI: abdomen is soft, non-tender, non-distended,  normal bowel sounds, no hepatosplenomegaly MSK: extremities are warm, no edema.  Skin: warm, no rash Neuro:  no focal deficits Psych: appropriate affect   Diagnostic Studies  05/2001 Cath  FINDINGS:  1. Left main trunk: Medium caliber vessel. This is a long vessel with a  distal taper of 20-30%.  2. Left anterior descending artery: This is a medium caliber vessel that  provides a large bifurcating first diagonal Leory Allinson in the proximal segment  and before then extending to the apex, the LAD tapers significantly after  the diagonal Kimo Bancroft. There is moderate disease  of 50-60% after the  diagonal Labria Wos and then a focal eccentric narrowing of 60-70% in the mid  section of the LAD. The diagonal Eufemio Strahm has moderate disease of 30-40% in  the proximal segment. The lateral division is a small caliber vessel with  an ostial narrowing of 60%.  3. Left circumflex artery: This is a small caliber vessel that provides a  trivial first marginal Daiel Strohecker in the mid section and a medium caliber  second marginal Scotty Weigelt distally. The mid AV circumflex at the takeoff of  the first and second diagonal branches had moderate diffuse disease of  60-70%.  4. Right coronary artery: Dominant. This is a large caliber vessel that  provides a posterior descending artery and two posteroventricular branches  in the terminal segment. The right coronary artery has evidence of an  existing stent in the mid section that is widely patent. Prior to stent is  a focal discrete narrowing of 40-50%. Distal to the stent is moderate  disease of 30%. The distal Aryanah Enslow vessels also have mild disease of 30%.  5. Left ventricle: Normal end-systolic and end-diastolic dimensions. Overall  left ventricular function is well preserved. Ejection fraction is greater  than 65%. No mitral regurgitation. LV pressure is 180/10, aortic is  180/90. LV EDP equals 20.  ASSESSMENT AND PLAN: Mr. Wiley is a 65 year old  gentleman with moderate  three-vessel coronary artery disease. The patients plaque burden in the left  anterior descending artery and circumflex appears to have increased somewhat  compared to his previous angiogram. Unfortunately, the mid and distal left  anterior descending artery is a small caliber vessel with a long diffuse  segment of disease and would be associated with a high restenosis rate.  Similarly, the circumflex would suffer the same fate.  Further assessment with a stress imaging study would be prudent to isolate the  area of ischemia. Percutaneous intervention may then be targeted if  indicated. In addition, aggressive medical therapy should be pursued as the  patient currently is not on any anti-anginal therapy and has poorly controlled  hypertension. The patient will also be counseled regarding smoking cessation.    07/2013 PFTs  Mild obstruction    10/2014 Lexiscan   Unable to adequately ambulate to treadmill due to right hip pain. Lexiscan employed.   Defect 1: There is a small defect of mild severity present in the mid inferior and apical inferior location. This defect is partially reversible.   This is a low risk study.   Nuclear stress EF: 58%.   Small region of apical inferior ischemia    07/2015 Echo Study Conclusions  - Left ventricle: The cavity size was normal. Wall thickness was  increased increased in a pattern of mild to moderate LVH.  Systolic function was normal. The estimated ejection fraction was  in the range of 60% to 65%. Diastolic function is abnormal,  indeterminate grade. Wall motion was normal; there were no  regional wall motion abnormalities. - Aortic valve: Mildly calcified annulus. Trileaflet; mildly  thickened leaflets. Valve area (VTI): 2.39 cm^2. Valve area  (Vmax): 2.58 cm^2. Valve area (Vmean): 2.7 cm^2. - Mitral valve: Mildly calcified annulus. Normal thickness leaflets  . - Technically adequate  study.  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 Recommend followup by ultrasound in 2 years. This recommendation follows ACR consensus guidelines:  Assessment and Plan   1. HTN - at  goal, continue current meds  2. AAA Noted by abdominal CT scan 10/2011, we will order AAA [place] - [place] 08/2015 3.5 cm aneurysm. Needs repeat US 08/2017   3. CAD  - no current symptoms, recent nuclear stress low risk with only small area of ischemia - EKG in clinic shows SR, no ischemic changes - continue current meds  4. Hyperlipidemia  - continue crestor. Request labs from pcp  5. Chronic diastolic HF - counseled ok to take additional torsemide as needed for LE edema.    F/u 4 months     Arnoldo Lenis, M.D.

## 2015-12-05 DIAGNOSIS — L97529 Non-pressure chronic ulcer of other part of left foot with unspecified severity: Secondary | ICD-10-CM | POA: Diagnosis not present

## 2015-12-05 DIAGNOSIS — E1142 Type 2 diabetes mellitus with diabetic polyneuropathy: Secondary | ICD-10-CM | POA: Diagnosis not present

## 2015-12-19 ENCOUNTER — Ambulatory Visit (INDEPENDENT_AMBULATORY_CARE_PROVIDER_SITE_OTHER): Payer: Medicare Other | Admitting: Urology

## 2015-12-19 DIAGNOSIS — L97529 Non-pressure chronic ulcer of other part of left foot with unspecified severity: Secondary | ICD-10-CM | POA: Diagnosis not present

## 2015-12-19 DIAGNOSIS — N5319 Other ejaculatory dysfunction: Secondary | ICD-10-CM

## 2015-12-19 DIAGNOSIS — E1142 Type 2 diabetes mellitus with diabetic polyneuropathy: Secondary | ICD-10-CM | POA: Diagnosis not present

## 2016-01-02 DIAGNOSIS — L97529 Non-pressure chronic ulcer of other part of left foot with unspecified severity: Secondary | ICD-10-CM | POA: Diagnosis not present

## 2016-01-02 DIAGNOSIS — E1142 Type 2 diabetes mellitus with diabetic polyneuropathy: Secondary | ICD-10-CM | POA: Diagnosis not present

## 2016-01-04 DIAGNOSIS — N183 Chronic kidney disease, stage 3 (moderate): Secondary | ICD-10-CM | POA: Diagnosis not present

## 2016-01-04 DIAGNOSIS — Z79899 Other long term (current) drug therapy: Secondary | ICD-10-CM | POA: Diagnosis not present

## 2016-01-04 DIAGNOSIS — D509 Iron deficiency anemia, unspecified: Secondary | ICD-10-CM | POA: Diagnosis not present

## 2016-01-04 DIAGNOSIS — E559 Vitamin D deficiency, unspecified: Secondary | ICD-10-CM | POA: Diagnosis not present

## 2016-01-04 DIAGNOSIS — I10 Essential (primary) hypertension: Secondary | ICD-10-CM | POA: Diagnosis not present

## 2016-01-04 DIAGNOSIS — R809 Proteinuria, unspecified: Secondary | ICD-10-CM | POA: Diagnosis not present

## 2016-01-10 DIAGNOSIS — R809 Proteinuria, unspecified: Secondary | ICD-10-CM | POA: Diagnosis not present

## 2016-01-10 DIAGNOSIS — I10 Essential (primary) hypertension: Secondary | ICD-10-CM | POA: Diagnosis not present

## 2016-01-10 DIAGNOSIS — E876 Hypokalemia: Secondary | ICD-10-CM | POA: Diagnosis not present

## 2016-01-10 DIAGNOSIS — N184 Chronic kidney disease, stage 4 (severe): Secondary | ICD-10-CM | POA: Diagnosis not present

## 2016-01-10 DIAGNOSIS — N2581 Secondary hyperparathyroidism of renal origin: Secondary | ICD-10-CM | POA: Diagnosis not present

## 2016-01-15 ENCOUNTER — Other Ambulatory Visit: Payer: Self-pay | Admitting: Cardiology

## 2016-01-15 DIAGNOSIS — K219 Gastro-esophageal reflux disease without esophagitis: Secondary | ICD-10-CM | POA: Diagnosis not present

## 2016-01-15 DIAGNOSIS — Z6831 Body mass index (BMI) 31.0-31.9, adult: Secondary | ICD-10-CM | POA: Diagnosis not present

## 2016-01-15 DIAGNOSIS — E039 Hypothyroidism, unspecified: Secondary | ICD-10-CM | POA: Diagnosis not present

## 2016-01-15 DIAGNOSIS — E1165 Type 2 diabetes mellitus with hyperglycemia: Secondary | ICD-10-CM | POA: Diagnosis not present

## 2016-01-15 DIAGNOSIS — R350 Frequency of micturition: Secondary | ICD-10-CM | POA: Diagnosis not present

## 2016-01-15 DIAGNOSIS — M545 Low back pain: Secondary | ICD-10-CM | POA: Diagnosis not present

## 2016-01-23 DIAGNOSIS — L97519 Non-pressure chronic ulcer of other part of right foot with unspecified severity: Secondary | ICD-10-CM | POA: Diagnosis not present

## 2016-01-23 DIAGNOSIS — E1142 Type 2 diabetes mellitus with diabetic polyneuropathy: Secondary | ICD-10-CM | POA: Diagnosis not present

## 2016-01-23 DIAGNOSIS — L97521 Non-pressure chronic ulcer of other part of left foot limited to breakdown of skin: Secondary | ICD-10-CM | POA: Diagnosis not present

## 2016-01-30 DIAGNOSIS — E1142 Type 2 diabetes mellitus with diabetic polyneuropathy: Secondary | ICD-10-CM | POA: Diagnosis not present

## 2016-01-30 DIAGNOSIS — L97529 Non-pressure chronic ulcer of other part of left foot with unspecified severity: Secondary | ICD-10-CM | POA: Diagnosis not present

## 2016-01-30 DIAGNOSIS — L281 Prurigo nodularis: Secondary | ICD-10-CM | POA: Diagnosis not present

## 2016-01-30 DIAGNOSIS — L97519 Non-pressure chronic ulcer of other part of right foot with unspecified severity: Secondary | ICD-10-CM | POA: Diagnosis not present

## 2016-02-06 DIAGNOSIS — L97511 Non-pressure chronic ulcer of other part of right foot limited to breakdown of skin: Secondary | ICD-10-CM | POA: Diagnosis not present

## 2016-02-06 DIAGNOSIS — L97521 Non-pressure chronic ulcer of other part of left foot limited to breakdown of skin: Secondary | ICD-10-CM | POA: Diagnosis not present

## 2016-02-06 DIAGNOSIS — E1142 Type 2 diabetes mellitus with diabetic polyneuropathy: Secondary | ICD-10-CM | POA: Diagnosis not present

## 2016-02-13 DIAGNOSIS — L97511 Non-pressure chronic ulcer of other part of right foot limited to breakdown of skin: Secondary | ICD-10-CM | POA: Diagnosis not present

## 2016-02-13 DIAGNOSIS — L97521 Non-pressure chronic ulcer of other part of left foot limited to breakdown of skin: Secondary | ICD-10-CM | POA: Diagnosis not present

## 2016-02-13 DIAGNOSIS — E1142 Type 2 diabetes mellitus with diabetic polyneuropathy: Secondary | ICD-10-CM | POA: Diagnosis not present

## 2016-02-27 DIAGNOSIS — E1142 Type 2 diabetes mellitus with diabetic polyneuropathy: Secondary | ICD-10-CM | POA: Diagnosis not present

## 2016-02-27 DIAGNOSIS — L97522 Non-pressure chronic ulcer of other part of left foot with fat layer exposed: Secondary | ICD-10-CM | POA: Diagnosis not present

## 2016-02-27 DIAGNOSIS — L97512 Non-pressure chronic ulcer of other part of right foot with fat layer exposed: Secondary | ICD-10-CM | POA: Diagnosis not present

## 2016-02-28 DIAGNOSIS — T7840XA Allergy, unspecified, initial encounter: Secondary | ICD-10-CM | POA: Diagnosis not present

## 2016-02-28 DIAGNOSIS — L281 Prurigo nodularis: Secondary | ICD-10-CM | POA: Diagnosis not present

## 2016-02-28 DIAGNOSIS — S80862A Insect bite (nonvenomous), left lower leg, initial encounter: Secondary | ICD-10-CM | POA: Diagnosis not present

## 2016-03-01 DIAGNOSIS — N189 Chronic kidney disease, unspecified: Secondary | ICD-10-CM | POA: Diagnosis not present

## 2016-03-01 DIAGNOSIS — I10 Essential (primary) hypertension: Secondary | ICD-10-CM | POA: Diagnosis not present

## 2016-03-01 DIAGNOSIS — E1165 Type 2 diabetes mellitus with hyperglycemia: Secondary | ICD-10-CM | POA: Diagnosis not present

## 2016-03-01 DIAGNOSIS — E7800 Pure hypercholesterolemia, unspecified: Secondary | ICD-10-CM | POA: Diagnosis not present

## 2016-03-01 DIAGNOSIS — E039 Hypothyroidism, unspecified: Secondary | ICD-10-CM | POA: Diagnosis not present

## 2016-03-01 DIAGNOSIS — K219 Gastro-esophageal reflux disease without esophagitis: Secondary | ICD-10-CM | POA: Diagnosis not present

## 2016-03-01 DIAGNOSIS — D519 Vitamin B12 deficiency anemia, unspecified: Secondary | ICD-10-CM | POA: Diagnosis not present

## 2016-03-04 DIAGNOSIS — L97522 Non-pressure chronic ulcer of other part of left foot with fat layer exposed: Secondary | ICD-10-CM | POA: Diagnosis not present

## 2016-03-04 DIAGNOSIS — L97512 Non-pressure chronic ulcer of other part of right foot with fat layer exposed: Secondary | ICD-10-CM | POA: Diagnosis not present

## 2016-03-04 DIAGNOSIS — E1142 Type 2 diabetes mellitus with diabetic polyneuropathy: Secondary | ICD-10-CM | POA: Diagnosis not present

## 2016-03-06 DIAGNOSIS — E039 Hypothyroidism, unspecified: Secondary | ICD-10-CM | POA: Diagnosis not present

## 2016-03-06 DIAGNOSIS — E1122 Type 2 diabetes mellitus with diabetic chronic kidney disease: Secondary | ICD-10-CM | POA: Diagnosis not present

## 2016-03-06 DIAGNOSIS — Z6832 Body mass index (BMI) 32.0-32.9, adult: Secondary | ICD-10-CM | POA: Diagnosis not present

## 2016-03-06 DIAGNOSIS — E1165 Type 2 diabetes mellitus with hyperglycemia: Secondary | ICD-10-CM | POA: Diagnosis not present

## 2016-03-06 DIAGNOSIS — J438 Other emphysema: Secondary | ICD-10-CM | POA: Diagnosis not present

## 2016-03-06 DIAGNOSIS — Z720 Tobacco use: Secondary | ICD-10-CM | POA: Diagnosis not present

## 2016-03-21 DIAGNOSIS — E1142 Type 2 diabetes mellitus with diabetic polyneuropathy: Secondary | ICD-10-CM | POA: Diagnosis not present

## 2016-03-21 DIAGNOSIS — L97522 Non-pressure chronic ulcer of other part of left foot with fat layer exposed: Secondary | ICD-10-CM | POA: Diagnosis not present

## 2016-03-21 DIAGNOSIS — L97512 Non-pressure chronic ulcer of other part of right foot with fat layer exposed: Secondary | ICD-10-CM | POA: Diagnosis not present

## 2016-03-28 DIAGNOSIS — L97512 Non-pressure chronic ulcer of other part of right foot with fat layer exposed: Secondary | ICD-10-CM | POA: Diagnosis not present

## 2016-03-28 DIAGNOSIS — E1142 Type 2 diabetes mellitus with diabetic polyneuropathy: Secondary | ICD-10-CM | POA: Diagnosis not present

## 2016-03-28 DIAGNOSIS — L97522 Non-pressure chronic ulcer of other part of left foot with fat layer exposed: Secondary | ICD-10-CM | POA: Diagnosis not present

## 2016-04-04 ENCOUNTER — Inpatient Hospital Stay (HOSPITAL_COMMUNITY)
Admission: EM | Admit: 2016-04-04 | Discharge: 2016-04-08 | DRG: 872 | Disposition: A | Discharge status: Home / Self Care | Payer: Medicare Other | Attending: Internal Medicine | Admitting: Internal Medicine

## 2016-04-04 ENCOUNTER — Encounter (HOSPITAL_COMMUNITY): Payer: Self-pay | Admitting: Emergency Medicine

## 2016-04-04 ENCOUNTER — Emergency Department (HOSPITAL_COMMUNITY): Payer: Medicare Other

## 2016-04-04 DIAGNOSIS — R197 Diarrhea, unspecified: Secondary | ICD-10-CM | POA: Diagnosis present

## 2016-04-04 DIAGNOSIS — A419 Sepsis, unspecified organism: Principal | ICD-10-CM | POA: Diagnosis present

## 2016-04-04 DIAGNOSIS — Z833 Family history of diabetes mellitus: Secondary | ICD-10-CM

## 2016-04-04 DIAGNOSIS — L97529 Non-pressure chronic ulcer of other part of left foot with unspecified severity: Secondary | ICD-10-CM

## 2016-04-04 DIAGNOSIS — F17210 Nicotine dependence, cigarettes, uncomplicated: Secondary | ICD-10-CM | POA: Diagnosis present

## 2016-04-04 DIAGNOSIS — E1121 Type 2 diabetes mellitus with diabetic nephropathy: Secondary | ICD-10-CM | POA: Diagnosis present

## 2016-04-04 DIAGNOSIS — J449 Chronic obstructive pulmonary disease, unspecified: Secondary | ICD-10-CM | POA: Diagnosis present

## 2016-04-04 DIAGNOSIS — L03116 Cellulitis of left lower limb: Secondary | ICD-10-CM | POA: Diagnosis not present

## 2016-04-04 DIAGNOSIS — E11621 Type 2 diabetes mellitus with foot ulcer: Secondary | ICD-10-CM | POA: Diagnosis present

## 2016-04-04 DIAGNOSIS — I130 Hypertensive heart and chronic kidney disease with heart failure and stage 1 through stage 4 chronic kidney disease, or unspecified chronic kidney disease: Secondary | ICD-10-CM | POA: Diagnosis present

## 2016-04-04 DIAGNOSIS — K210 Gastro-esophageal reflux disease with esophagitis: Secondary | ICD-10-CM | POA: Diagnosis present

## 2016-04-04 DIAGNOSIS — E08621 Diabetes mellitus due to underlying condition with foot ulcer: Secondary | ICD-10-CM | POA: Diagnosis present

## 2016-04-04 DIAGNOSIS — R600 Localized edema: Secondary | ICD-10-CM

## 2016-04-04 DIAGNOSIS — E1151 Type 2 diabetes mellitus with diabetic peripheral angiopathy without gangrene: Secondary | ICD-10-CM | POA: Diagnosis present

## 2016-04-04 DIAGNOSIS — R509 Fever, unspecified: Secondary | ICD-10-CM

## 2016-04-04 DIAGNOSIS — R7881 Bacteremia: Secondary | ICD-10-CM

## 2016-04-04 DIAGNOSIS — I10 Essential (primary) hypertension: Secondary | ICD-10-CM | POA: Diagnosis not present

## 2016-04-04 DIAGNOSIS — R319 Hematuria, unspecified: Secondary | ICD-10-CM | POA: Diagnosis present

## 2016-04-04 DIAGNOSIS — N183 Chronic kidney disease, stage 3 unspecified: Secondary | ICD-10-CM | POA: Diagnosis present

## 2016-04-04 DIAGNOSIS — E1122 Type 2 diabetes mellitus with diabetic chronic kidney disease: Secondary | ICD-10-CM | POA: Diagnosis present

## 2016-04-04 DIAGNOSIS — Z955 Presence of coronary angioplasty implant and graft: Secondary | ICD-10-CM

## 2016-04-04 DIAGNOSIS — I2510 Atherosclerotic heart disease of native coronary artery without angina pectoris: Secondary | ICD-10-CM | POA: Diagnosis present

## 2016-04-04 DIAGNOSIS — Z89429 Acquired absence of other toe(s), unspecified side: Secondary | ICD-10-CM

## 2016-04-04 DIAGNOSIS — R3129 Other microscopic hematuria: Secondary | ICD-10-CM | POA: Diagnosis present

## 2016-04-04 DIAGNOSIS — E785 Hyperlipidemia, unspecified: Secondary | ICD-10-CM | POA: Diagnosis present

## 2016-04-04 DIAGNOSIS — B955 Unspecified streptococcus as the cause of diseases classified elsewhere: Secondary | ICD-10-CM | POA: Diagnosis present

## 2016-04-04 DIAGNOSIS — I731 Thromboangiitis obliterans [Buerger's disease]: Secondary | ICD-10-CM | POA: Diagnosis present

## 2016-04-04 DIAGNOSIS — E871 Hypo-osmolality and hyponatremia: Secondary | ICD-10-CM | POA: Diagnosis present

## 2016-04-04 DIAGNOSIS — Z7982 Long term (current) use of aspirin: Secondary | ICD-10-CM

## 2016-04-04 DIAGNOSIS — F411 Generalized anxiety disorder: Secondary | ICD-10-CM | POA: Diagnosis present

## 2016-04-04 DIAGNOSIS — I50810 Right heart failure, unspecified: Secondary | ICD-10-CM | POA: Diagnosis present

## 2016-04-04 DIAGNOSIS — Z79899 Other long term (current) drug therapy: Secondary | ICD-10-CM

## 2016-04-04 DIAGNOSIS — F17200 Nicotine dependence, unspecified, uncomplicated: Secondary | ICD-10-CM | POA: Diagnosis not present

## 2016-04-04 DIAGNOSIS — E876 Hypokalemia: Secondary | ICD-10-CM | POA: Diagnosis present

## 2016-04-04 DIAGNOSIS — L97519 Non-pressure chronic ulcer of other part of right foot with unspecified severity: Secondary | ICD-10-CM | POA: Diagnosis present

## 2016-04-04 DIAGNOSIS — E1140 Type 2 diabetes mellitus with diabetic neuropathy, unspecified: Secondary | ICD-10-CM | POA: Diagnosis present

## 2016-04-04 DIAGNOSIS — Z794 Long term (current) use of insulin: Secondary | ICD-10-CM

## 2016-04-04 DIAGNOSIS — R05 Cough: Secondary | ICD-10-CM | POA: Diagnosis not present

## 2016-04-04 HISTORY — DX: Headache: R51

## 2016-04-04 HISTORY — DX: Sleep apnea, unspecified: G47.30

## 2016-04-04 HISTORY — DX: Unspecified asthma, uncomplicated: J45.909

## 2016-04-04 HISTORY — DX: Pneumonia, unspecified organism: J18.9

## 2016-04-04 HISTORY — DX: Headache, unspecified: R51.9

## 2016-04-04 LAB — URINALYSIS, ROUTINE W REFLEX MICROSCOPIC
Bilirubin Urine: NEGATIVE
Glucose, UA: NEGATIVE mg/dL
Ketones, ur: NEGATIVE mg/dL
Leukocytes, UA: NEGATIVE
Nitrite: NEGATIVE
Protein, ur: >300 mg/dL — AB
Specific Gravity, Urine: 1.01 (ref 1.005–1.030)
pH: 7 (ref 5.0–8.0)

## 2016-04-04 LAB — COMPREHENSIVE METABOLIC PANEL
ALT: 13 U/L — ABNORMAL LOW (ref 17–63)
AST: 19 U/L (ref 15–41)
Albumin: 3.1 g/dL — ABNORMAL LOW (ref 3.5–5.0)
Alkaline Phosphatase: 52 U/L (ref 38–126)
Anion gap: 10 (ref 5–15)
BUN: 20 mg/dL (ref 6–20)
CO2: 27 mmol/L (ref 22–32)
Calcium: 8.4 mg/dL — ABNORMAL LOW (ref 8.9–10.3)
Chloride: 94 mmol/L — ABNORMAL LOW (ref 101–111)
Creatinine, Ser: 2.59 mg/dL — ABNORMAL HIGH (ref 0.61–1.24)
GFR calc Af Amer: 29 mL/min — ABNORMAL LOW (ref >60)
GFR calc non Af Amer: 25 mL/min — ABNORMAL LOW (ref >60)
Glucose, Bld: 115 mg/dL — ABNORMAL HIGH (ref 65–99)
Potassium: 2.9 mmol/L — ABNORMAL LOW (ref 3.5–5.1)
Sodium: 131 mmol/L — ABNORMAL LOW (ref 135–145)
Total Bilirubin: 1 mg/dL (ref 0.3–1.2)
Total Protein: 7.7 g/dL (ref 6.5–8.1)

## 2016-04-04 LAB — URINE MICROSCOPIC-ADD ON

## 2016-04-04 LAB — CBC WITH DIFFERENTIAL/PLATELET
Basophils Absolute: 0 10*3/uL (ref 0.0–0.1)
Basophils Relative: 0 %
Eosinophils Absolute: 0 10*3/uL (ref 0.0–0.7)
Eosinophils Relative: 0 %
HCT: 41.3 % (ref 39.0–52.0)
Hemoglobin: 14.3 g/dL (ref 13.0–17.0)
Lymphocytes Relative: 7 %
Lymphs Abs: 1.3 10*3/uL (ref 0.7–4.0)
MCH: 32.6 pg (ref 26.0–34.0)
MCHC: 34.6 g/dL (ref 30.0–36.0)
MCV: 94.3 fL (ref 78.0–100.0)
Monocytes Absolute: 1.2 10*3/uL — ABNORMAL HIGH (ref 0.1–1.0)
Monocytes Relative: 7 %
Neutro Abs: 14.9 10*3/uL — ABNORMAL HIGH (ref 1.7–7.7)
Neutrophils Relative %: 86 %
Platelets: 223 10*3/uL (ref 150–400)
RBC: 4.38 MIL/uL (ref 4.22–5.81)
RDW: 14.1 % (ref 11.5–15.5)
WBC: 17.4 10*3/uL — ABNORMAL HIGH (ref 4.0–10.5)

## 2016-04-04 LAB — I-STAT CG4 LACTIC ACID, ED
Lactic Acid, Venous: 1.89 mmol/L (ref 0.5–1.9)
Lactic Acid, Venous: 0.71 mmol/L (ref 0.5–1.9)

## 2016-04-04 LAB — INFLUENZA PANEL BY PCR (TYPE A & B)
Influenza A By PCR: NEGATIVE
Influenza B By PCR: NEGATIVE

## 2016-04-04 LAB — GLUCOSE, CAPILLARY: Glucose-Capillary: 160 mg/dL — ABNORMAL HIGH (ref 65–99)

## 2016-04-04 MED ORDER — SODIUM CHLORIDE 0.9 % IV SOLN
INTRAVENOUS | Status: DC
  Administered 2016-04-04: 21:00:00 via INTRAVENOUS

## 2016-04-04 MED ORDER — ACETAMINOPHEN 325 MG PO TABS
650.0000 mg | ORAL_TABLET | Freq: Once | ORAL | Status: AC
  Administered 2016-04-04: 650 mg via ORAL
  Filled 2016-04-04: qty 2

## 2016-04-04 MED ORDER — ROSUVASTATIN CALCIUM 20 MG PO TABS
20.0000 mg | ORAL_TABLET | Freq: Every day | ORAL | Status: DC
  Administered 2016-04-04 – 2016-04-07 (×4): 20 mg via ORAL
  Filled 2016-04-04 (×4): qty 1

## 2016-04-04 MED ORDER — ASPIRIN 81 MG PO CHEW
81.0000 mg | CHEWABLE_TABLET | Freq: Every day | ORAL | Status: DC
  Administered 2016-04-05 – 2016-04-08 (×4): 81 mg via ORAL
  Filled 2016-04-04 (×4): qty 1

## 2016-04-04 MED ORDER — SODIUM CHLORIDE 0.9 % IV BOLUS (SEPSIS)
2000.0000 mL | Freq: Once | INTRAVENOUS | Status: AC
  Administered 2016-04-04: 2000 mL via INTRAVENOUS

## 2016-04-04 MED ORDER — PANTOPRAZOLE SODIUM 40 MG PO TBEC
40.0000 mg | DELAYED_RELEASE_TABLET | Freq: Every day | ORAL | Status: DC
  Administered 2016-04-05 – 2016-04-08 (×4): 40 mg via ORAL
  Filled 2016-04-04 (×5): qty 1

## 2016-04-04 MED ORDER — DEXTROSE 5 % IV SOLN
1.0000 g | Freq: Two times a day (BID) | INTRAVENOUS | Status: DC
  Administered 2016-04-04: 1 g via INTRAVENOUS
  Filled 2016-04-04 (×8): qty 1

## 2016-04-04 MED ORDER — VANCOMYCIN HCL IN DEXTROSE 1-5 GM/200ML-% IV SOLN
1000.0000 mg | INTRAVENOUS | Status: AC
  Administered 2016-04-05 (×2): 1000 mg via INTRAVENOUS
  Filled 2016-04-04: qty 200

## 2016-04-04 MED ORDER — ONDANSETRON HCL 4 MG/2ML IJ SOLN
4.0000 mg | Freq: Four times a day (QID) | INTRAMUSCULAR | Status: DC | PRN
  Administered 2016-04-05 (×3): 4 mg via INTRAVENOUS
  Filled 2016-04-04 (×3): qty 2

## 2016-04-04 MED ORDER — OXYCODONE-ACETAMINOPHEN 5-325 MG PO TABS
1.0000 | ORAL_TABLET | Freq: Once | ORAL | Status: AC
  Administered 2016-04-04: 1 via ORAL
  Filled 2016-04-04: qty 1

## 2016-04-04 MED ORDER — HEPARIN SODIUM (PORCINE) 5000 UNIT/ML IJ SOLN
5000.0000 [IU] | Freq: Three times a day (TID) | INTRAMUSCULAR | Status: DC
  Administered 2016-04-04 – 2016-04-08 (×10): 5000 [IU] via SUBCUTANEOUS
  Filled 2016-04-04 (×10): qty 1

## 2016-04-04 MED ORDER — ONDANSETRON HCL 4 MG PO TABS: 4.0000 mg | ORAL_TABLET | Freq: Four times a day (QID) | ORAL | Status: DC | PRN

## 2016-04-04 MED ORDER — HYDROMORPHONE HCL 1 MG/ML IJ SOLN
1.0000 mg | INTRAMUSCULAR | Status: DC | PRN
  Administered 2016-04-04 – 2016-04-05 (×5): 1 mg via INTRAVENOUS
  Filled 2016-04-04 (×5): qty 1

## 2016-04-04 MED ORDER — ALBUTEROL SULFATE (2.5 MG/3ML) 0.083% IN NEBU: 3.0000 mL | INHALATION_SOLUTION | Freq: Four times a day (QID) | RESPIRATORY_TRACT | Status: DC | PRN

## 2016-04-04 MED ORDER — SODIUM CHLORIDE 0.9 % IV BOLUS (SEPSIS)
1000.0000 mL | Freq: Once | INTRAVENOUS | Status: AC
  Administered 2016-04-04: 1000 mL via INTRAVENOUS

## 2016-04-04 MED ORDER — ACETAMINOPHEN 325 MG PO TABS
650.0000 mg | ORAL_TABLET | ORAL | Status: DC | PRN
  Administered 2016-04-05 – 2016-04-07 (×3): 650 mg via ORAL
  Filled 2016-04-04 (×3): qty 2

## 2016-04-04 MED ORDER — POTASSIUM CHLORIDE CRYS ER 20 MEQ PO TBCR
40.0000 meq | EXTENDED_RELEASE_TABLET | Freq: Once | ORAL | Status: AC
  Administered 2016-04-04: 40 meq via ORAL
  Filled 2016-04-04: qty 2

## 2016-04-04 MED ORDER — INSULIN ASPART 100 UNIT/ML ~~LOC~~ SOLN
0.0000 [IU] | Freq: Three times a day (TID) | SUBCUTANEOUS | Status: DC
  Administered 2016-04-05: 3 [IU] via SUBCUTANEOUS
  Administered 2016-04-06 – 2016-04-07 (×3): 2 [IU] via SUBCUTANEOUS
  Administered 2016-04-07: 3 [IU] via SUBCUTANEOUS
  Administered 2016-04-07: 2 [IU] via SUBCUTANEOUS
  Administered 2016-04-08: 3 [IU] via SUBCUTANEOUS

## 2016-04-04 MED ORDER — VANCOMYCIN HCL 10 G IV SOLR: 1250.0000 mg | INTRAVENOUS | Status: DC

## 2016-04-04 MED ORDER — DEXTROSE 5 % IV SOLN
1.0000 g | Freq: Once | INTRAVENOUS | Status: AC
  Administered 2016-04-04: 1 g via INTRAVENOUS
  Filled 2016-04-04: qty 10

## 2016-04-04 MED ORDER — NICOTINE 21 MG/24HR TD PT24
21.0000 mg | MEDICATED_PATCH | Freq: Every day | TRANSDERMAL | Status: DC
  Administered 2016-04-05 – 2016-04-08 (×4): 21 mg via TRANSDERMAL
  Filled 2016-04-04 (×5): qty 1

## 2016-04-04 MED ORDER — DEXTROSE 5 % IV SOLN
INTRAVENOUS | Status: AC
  Filled 2016-04-04: qty 1

## 2016-04-04 MED ORDER — SODIUM CHLORIDE 0.9% FLUSH
3.0000 mL | Freq: Two times a day (BID) | INTRAVENOUS | Status: DC
  Administered 2016-04-04 – 2016-04-08 (×6): 3 mL via INTRAVENOUS

## 2016-04-04 MED ORDER — LEVOFLOXACIN IN D5W 750 MG/150ML IV SOLN
750.0000 mg | INTRAVENOUS | Status: DC
  Administered 2016-04-04: 750 mg via INTRAVENOUS
  Filled 2016-04-04: qty 150

## 2016-04-04 MED ORDER — INSULIN ASPART 100 UNIT/ML ~~LOC~~ SOLN: 0.0000 [IU] | Freq: Every day | SUBCUTANEOUS | Status: DC

## 2016-04-04 MED ORDER — INSULIN GLARGINE 100 UNIT/ML ~~LOC~~ SOLN
10.0000 [IU] | Freq: Every day | SUBCUTANEOUS | Status: DC
  Administered 2016-04-05 – 2016-04-07 (×4): 10 [IU] via SUBCUTANEOUS
  Filled 2016-04-04 (×5): qty 0.1

## 2016-04-04 NOTE — ED Triage Notes (Signed)
Pt states he developed a fever last night of 101.  Has not taken any medications for fever today.  Only other complaint is generalized aches.  Pt states "I know this is a septic infection" but cannot identify any symptoms related to this.

## 2016-04-04 NOTE — ED Notes (Signed)
To x-ray

## 2016-04-04 NOTE — ED Notes (Signed)
Patient given urinal at bedside.

## 2016-04-04 NOTE — ED Notes (Signed)
Paitent requesting something for pain. MD made aware

## 2016-04-04 NOTE — Progress Notes (Signed)
Pharmacy Antibiotic Note  [name] is a 65 y.o. male admitted on 04/04/2016 with sepsis.  Pharmacy has been consulted for Vancomycin dosing.  Plan: Vancomycin 2 GM IV loading dose then, Vancomycin 1250 IV every 24 hours.  Goal trough 15-20 mcg/mL.  Vancomycin trough at steady state Lab per protocol  Height: 6\' 4"  (193 cm) Weight: 256 lb (116.1 kg) IBW/kg (Calculated) : 86.8  Temp (24hrs), Avg:98.9 F (37.2 C), Min:98.5 F (36.9 C), Max:99.2 F (37.3 C)   Recent Labs Lab 04/04/16 1253 04/04/16 1258 04/04/16 1605  WBC 17.4*  --   --   CREATININE 2.59*  --   --   LATICACIDVEN  --  1.89 0.71    Estimated Creatinine Clearance: 40.7 mL/min (by C-G formula based on SCr of 2.59 mg/dL (H)).    No Known Allergies  Antimicrobials this admission: Vancomycin  11/16 >>    Dose adjustments this admission:  Thank you for allowing pharmacy to be a part of this patient's care.  Chriss Czar 04/04/2016 8:09 PM

## 2016-04-04 NOTE — ED Notes (Signed)
Patient continues to [employer] loss adjuster "I want something to make me go to sleep".  After nurse gave patient percocet patient stated "can I have another". Patient explained to patient we will wait and see if that dose helps him then reassess. Patient verbalizes understanding.

## 2016-04-04 NOTE — H&P (Signed)
History and Physical    [name] AQT:[id number] DOB: [birth year] DOA: 04/04/2016  PCP: [name], MD  Patient coming from: Home.   Chief Complaint:   Chills and general malaise.   HPI: [name] is an 65 y.o. male with hx of CAD, DM2, obesity, HLD, CKD3, CHF on diuretics, COPD with tobacco abuse, presented to the ER with sudden onset of subjective fever, rigors, and non productive coughs.  He has no dysuria, abdominal pain, nausea, vomting sore throat, HA, stiff neck or diarrhea. His wife of 6 weeks, an Therapist, sports, said he does get sick quite quickly.  Evaluation in the ER showed negative influenza, clear CXR, and UA with hematuria.  His vital signs were stable. He was given IV Rocephin, and hospitalist was asked to admit him for sepsis of unclear source.  His WBC was 17K, and his lactic acid was 0.71.    ED Course:  See above.  Rewiew of Systems:  Constitutional: Negative for malaise, fever and chills. No significant weight loss or weight gain Eyes: Negative for eye pain, redness and discharge, diplopia, visual changes, or flashes of light. ENMT: Negative for ear pain, hoarseness, nasal congestion, sinus pressure and sore throat. No headaches; tinnitus, drooling, or problem swallowing. Cardiovascular: Negative for chest pain, palpitations, diaphoresis, dyspnea and peripheral edema. ; No orthopnea, PND Respiratory: Negative for hemoptysis, wheezing and stridor. No pleuritic chestpain. Gastrointestinal: Negative for diarrhea, constipation,  melena, blood in stool, hematemesis, jaundice and rectal bleeding.    Genitourinary: Negative for frequency, dysuria, incontinence,flank pain and hematuria; Musculoskeletal: Negative for back pain and neck pain. Negative for swelling and trauma.;  Skin: . Negative for pruritus, rash, abrasions, bruising and skin lesion.; ulcerations Neuro: Negative for headache, lightheadedness and neck stiffness. Negative for weakness, altered level of  consciousness , altered mental status, extremity weakness, burning feet, involuntary movement, seizure and syncope.  Psych: negative for anxiety, depression, insomnia, tearfulness, panic attacks, hallucinations, paranoia, suicidal or homicidal ideation    Past Medical History:  Diagnosis Date  . CAD (coronary artery disease)    multivessel  . Chronic renal insufficiency   . COPD (chronic obstructive pulmonary disease) ([place])    with ongoing tobacco use and patient failed sham takes  . DM2 (diabetes mellitus, type 2) ([place])   . Dyslipidemia   . Edema    chronic lower extremity secondary to right heart failure and chronic venous insufficiency  . HTN (hypertension)   . Morbid obesity ([place])   . 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   Marland Kitchen Reflux esophagitis    hx  . Renal insufficiency   . Two-vessel coronary artery disease    moderate. by cath in 2003. Status post stenting of the mdi RCA November 1999 normal left ventricular ejection fraction    Past Surgical History:  Procedure Laterality Date  . ABDOMINAL SURGERY    . BACK SURGERY    . diabetic ulcers       reports that he has been smoking Cigarettes.  He started smoking about 47 years ago. He has a 40.00 pack-year smoking history. He has never used smokeless tobacco. He reports that he does not drink alcohol or use drugs.  No Known Allergies  Family History  Problem Relation Age of Onset  . Diabetes Mother   . Alcohol abuse Father      Prior to Admission medications   Medication Sig Start Date End Date Taking? Authorizing Provider  albuterol (  VENTOLIN HFA) 108 (90 BASE) MCG/ACT inhaler Inhale 2 puffs into the lungs every 6 (six) hours as needed for wheezing or shortness of breath.    Yes Historical Provider, MD  amLODipine (NORVASC) 10 MG tablet Take 1 tablet (10 mg total) by mouth daily. 08/31/15  Yes Arnoldo Lenis, MD  aspirin 81 MG tablet Take 81 mg by mouth  daily. On hold since 08/08/15   Yes Historical Provider, MD  canagliflozin (INVOKANA) 100 MG TABS tablet Take 100 mg by mouth daily before breakfast.   Yes Historical Provider, MD  carvedilol (COREG) 25 MG tablet Take 37.5 mg by mouth 2 (two) times daily with a meal.   Yes Historical Provider, MD  cholecalciferol (VITAMIN D) 1000 UNITS tablet Take 1,000 Units by mouth daily.   Yes Historical Provider, MD  clindamycin (CLEOCIN) 300 MG capsule Take 1 capsule by mouth 2 (two) times daily. 03/18/16  Yes Historical Provider, MD  hydrALAZINE (APRESOLINE) 100 MG tablet Take 100 mg by mouth 3 (three) times daily.   Yes Historical Provider, MD  hydrochlorothiazide (HYDRODIURIL) 25 MG tablet Take 25 mg by mouth daily.   Yes Historical Provider, MD  insulin glargine (LANTUS) 100 UNIT/ML injection Inject 20 Units into the skin every morning.    Yes Historical Provider, MD  labetalol (NORMODYNE) 200 MG tablet Take 2.5 tablets (500 mg total) by mouth 2 (two) times daily. 08/31/15  Yes Arnoldo Lenis, MD  LINZESS 290 MCG CAPS capsule Take 290 mcg by mouth daily as needed (for constipation).  07/26/15  Yes Historical Provider, MD  Multiple Vitamin (MULTIVITAMIN) tablet Take 1 tablet by mouth daily.     Yes Historical Provider, MD  nicotine (NICODERM CQ - DOSED IN MG/24 HOURS) 21 mg/24hr patch Place 21 mg onto the skin daily.   Yes Historical Provider, MD  omeprazole (PRILOSEC) 20 MG capsule Take 20 mg by mouth daily.     Yes Historical Provider, MD  ramipril (ALTACE) 10 MG tablet Take 10 mg by mouth at bedtime.    Yes Historical Provider, MD  rosuvastatin (CRESTOR) 20 MG tablet TAKE ONE TABLET BY MOUTH DAILY 01/15/16  Yes Arnoldo Lenis, MD  sildenafil (REVATIO) 20 MG tablet Take 2 tablets by mouth daily as needed (erectile disfunction).  06/05/15  Yes Historical Provider, MD  torsemide (DEMADEX) 20 MG tablet Take 20 mg by mouth 2 (two) times daily.   Yes Historical Provider, MD  TRULICITY 1.5 DV/7.6HY SOPN INJECT  1.5 MG SUBCUTANEOUSLY ONCE every Wednesday 04/12/15  Yes Historical Provider, MD  triamcinolone cream (KENALOG) 0.1 % Apply 1 application topically daily. 03/04/16   Historical Provider, MD    Physical Exam: Vitals:   04/04/16 1430 04/04/16 1500 04/04/16 1530 04/04/16 1600  BP: (!) 92/48 (!) 119/54 106/64 115/90  Pulse: 77 74 79   Resp: 16 14 (!) 27 19  Temp:      TempSrc:      SpO2: 94% 96% 97%   Weight:      Height:          Constitutional: NAD, calm, comfortable Vitals:   04/04/16 1430 04/04/16 1500 04/04/16 1530 04/04/16 1600  BP: (!) 92/48 (!) 119/54 106/64 115/90  Pulse: 77 74 79   Resp: 16 14 (!) 27 19  Temp:      TempSrc:      SpO2: 94% 96% 97%   Weight:      Height:       Eyes: PERRL, lids and conjunctivae normal ENMT:  Mucous membranes are moist. Posterior pharynx clear of any exudate or lesions.Normal dentition.  Neck: normal, supple, no masses, no thyromegaly Respiratory: clear to auscultation bilaterally, no wheezing, no crackles. Normal respiratory effort. No accessory muscle use.  Cardiovascular: Regular rate and rhythm, no murmurs / rubs / gallops. No extremity edema. 2+ pedal pulses. No carotid bruits.  Abdomen: no tenderness, no masses palpated. No hepatosplenomegaly. Bowel sounds positive.  Musculoskeletal: no clubbing / cyanosis. No joint deformity upper and lower extremities. Good ROM, no contractures. Normal muscle tone.  Skin: no rashes, lesions, ulcers. No induration Neurologic: CN 2-12 grossly intact. Sensation intact, DTR normal. Strength 5/5 in all 4.  Psychiatric: Normal judgment and insight. Alert and oriented x 3. Normal mood.   Labs on Admission: I have personally reviewed following labs and imaging studies  CBC:  Recent Labs Lab 04/04/16 1253  WBC 17.4*  NEUTROABS 14.9*  HGB 14.3  HCT 41.3  MCV 94.3  PLT 240   Basic Metabolic Panel:  Recent Labs Lab 04/04/16 1253  NA 131*  K 2.9*  CL 94*  CO2 27  GLUCOSE 115*  BUN 20    CREATININE 2.59*  CALCIUM 8.4*   GFR: Estimated Creatinine Clearance: 40.7 mL/min (by C-G formula based on SCr of 2.59 mg/dL (H)). Liver Function Tests:  Recent Labs Lab 04/04/16 1253  AST 19  ALT 13*  ALKPHOS 52  BILITOT 1.0  PROT 7.7  ALBUMIN 3.1*   Urine analysis:    Component Value Date/Time   COLORURINE YELLOW 04/04/2016 1500   APPEARANCEUR CLEAR 04/04/2016 1500   LABSPEC 1.010 04/04/2016 1500   PHURINE 7.0 04/04/2016 1500   GLUCOSEU NEGATIVE 04/04/2016 1500   HGBUR MODERATE (A) 04/04/2016 1500   BILIRUBINUR NEGATIVE 04/04/2016 1500   KETONESUR NEGATIVE 04/04/2016 1500   PROTEINUR >300 (A) 04/04/2016 1500   UROBILINOGEN 0.2 09/27/2014 0027   NITRITE NEGATIVE 04/04/2016 1500   LEUKOCYTESUR NEGATIVE 04/04/2016 1500    Recent Results (from the past 240 hour(s))  Blood culture (routine x 2)     Status: None (Preliminary result)   Collection Time: 04/04/16  1:13 PM  Result Value Ref Range Status   Specimen Description BLOOD RIGHT ANTECUBITAL  Final   Special Requests   Final    BOTTLES DRAWN AEROBIC AND ANAEROBIC AEB=10CC ANA=7CC   Culture PENDING  Incomplete   Report Status PENDING  Incomplete  Blood culture (routine x 2)     Status: None (Preliminary result)   Collection Time: 04/04/16  1:21 PM  Result Value Ref Range Status   Specimen Description BLOOD LEFT HAND  Final   Special Requests BOTTLES DRAWN AEROBIC AND ANAEROBIC 6CC  Final   Culture PENDING  Incomplete   Report Status PENDING  Incomplete     Radiological Exams on Admission: Dg Chest 2 View  Result Date: 04/04/2016 CLINICAL DATA:  Fever starting last night. Cough and congestion. COPD. EXAM: CHEST  2 VIEW COMPARISON:  07/31/2015 FINDINGS: Atherosclerotic calcification of the aortic arch. Heart size within normal limits. Old right posterolateral rib deformities compatible with old healed fractures. Large lung volumes. No pleural effusion. Vague linear density in the left lung apex, not readily  apparent previously, I favor this representing subsegmental atelectasis and given the thickness of the finding I am skeptical that this is a pleural reflection edge related to a pneumothorax. IMPRESSION: 1. Faint linear opacity at the left lung apex probably from subsegmental atelectasis. 2. Atherosclerotic aortic arch. 3. Large lung volumes, possibly from emphysema. Electronically Signed  By: Van Clines M.D.   On: 04/04/2016 13:14    EKG: Independently reviewed.  Assessment/Plan Principal Problem:   Sepsis ([place]) Active Problems:   Generalized anxiety disorder   TOBACCO ABUSE   BUERGER'S DISEASE   Hypertension   CKD stage 3 due to type 2 diabetes mellitus (H[facility])    PLAN:   Sepsis of unclear source:  He has hematuria, but no pyuria, no dysuria, and his prostate exam negative acute prostatitis.  His CXR is clear, and it is possible he has a viral infection.  Will need to start him on Broad spectrum IV antibiotics, including atypical coverage for PNA.  Will give IV Van/Cefepime/Levoquin, pending cultures.  Will give IVF and hold his BP meds.  He has hx of CHF so we ll be careful.  Will check immunoglobin levels.   CHF:  Stable.  Hold diuretic in setting of sepsis.    DM:  Hold Ivokana due to CKD.  Hold oral meds, use SSI.  Give Lantus at 10 units Q hs.   COPD:  Stable.   Tobacco abuse:  Advised continue cessation.    DVT prophylaxis: Heparin SQ.  Code Status: FULL CODE.  ( I confirmed this.  He was DNR previously).  Family Communication: Wife at bedside.  Disposition Plan: To home when stable.  Consults called: None Admission status: Inpatient admission.    Izabella Marcantel MD FACP. [facility]  If 7PM-7AM, please contact night-coverage [email or web address] Password [email or web address]  04/04/2016, 5:19 PM

## 2016-04-04 NOTE — ED Provider Notes (Signed)
[facility] DEPT Provider Note   CSN: [id number] Arrival date & time: 04/04/16  1156     History   Chief Complaint Chief Complaint  Patient presents with  . Fever    HPI [name] is a 65 y.o. male.  Patient complains of fevers and chills and aches all over. Patient states he has a history of sepsis before    Fever   This is a new problem. The current episode started 6 to 12 hours ago. The problem occurs constantly. The problem has not changed since onset.The maximum temperature noted was 101 to 101.9 F. Pertinent negatives include no chest pain, no diarrhea, no congestion, no headaches and no cough. He has tried nothing for the symptoms. The treatment provided no relief.    Past Medical History:  Diagnosis Date  . CAD (coronary artery disease)    multivessel  . Chronic renal insufficiency   . COPD (chronic obstructive pulmonary disease) ([place])    with ongoing tobacco use and patient failed sham takes  . DM2 (diabetes mellitus, type 2) ([place])   . Dyslipidemia   . Edema    chronic lower extremity secondary to right heart failure and chronic venous insufficiency  . HTN (hypertension)   . Morbid obesity ([place])   . 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   Marland Kitchen Reflux esophagitis    hx  . Renal insufficiency   . Two-vessel coronary artery disease    moderate. by cath in 2003. Status post stenting of the mdi RCA November 1999 normal left ventricular ejection fraction    Patient Active Problem List   Diagnosis Date Noted  . CAD (coronary artery disease) 11/10/2015  . Bilateral lower extremity edema 07/25/2015  . Foot infection 09/27/2014  . Diabetic foot infection ([place]) 09/27/2014  . CKD stage 3 due to type 2 diabetes mellitus ([place])   . Diabetes mellitus with renal manifestations, uncontrolled ([place])   . Benign essential HTN   . ED (erectile dysfunction) 06/09/2012  . Hypertension 03/11/2011  .  OSTEOMYELITIS, ACUTE, ANKLE/FOOT 07/24/2010  . EDEMA 02/15/2010  . MORBID OBESITY 07/05/2009  . TOBACCO ABUSE 07/05/2009  . OBSTRUCTIVE SLEEP APNEA 07/05/2009  . COPD 07/05/2009  . 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 HYPERLIPIDEMIA 03/30/2008  . GENERALIZED ANXIETY DISORDER 03/30/2008  . [place] DISEASE 03/30/2008    Past Surgical History:  Procedure Laterality Date  . ABDOMINAL SURGERY    . BACK SURGERY    . diabetic ulcers         Home Medications    Prior to Admission medications   Medication Sig Start Date End Date Taking? Authorizing Provider  albuterol (VENTOLIN HFA) 108 (90 BASE) MCG/ACT inhaler Inhale 2 puffs into the lungs every 6 (six) hours as needed for wheezing or shortness of breath.    Yes Historical Provider, MD  amLODipine (NORVASC) 10 MG tablet Take 1 tablet (10 mg total) by mouth daily. 08/31/15  Yes Arnoldo Lenis, MD  aspirin 81 MG tablet Take 81 mg by mouth daily. On hold since 08/08/15   Yes Historical Provider, MD  canagliflozin (INVOKANA) 100 MG TABS tablet Take 100 mg by mouth daily before breakfast.   Yes Historical Provider, MD  carvedilol (COREG) 25 MG tablet Take 37.5 mg by mouth 2 (two) times daily with a meal.   Yes Historical Provider, MD  cholecalciferol (VITAMIN D) 1000 UNITS tablet Take 1,000 Units by mouth daily.   Yes Historical Provider, MD  clindamycin (CLEOCIN) 300 MG capsule Take 1 capsule by mouth 2 (two) times daily. 03/18/16  Yes Historical Provider, MD  hydrALAZINE (APRESOLINE) 100 MG tablet Take 100 mg by mouth 3 (three) times daily.   Yes Historical Provider, MD  hydrochlorothiazide (HYDRODIURIL) 25 MG tablet Take 25 mg by mouth daily.   Yes Historical Provider, MD  insulin glargine (LANTUS) 100 UNIT/ML injection Inject 20 Units into the skin every morning.    Yes Historical Provider, MD  labetalol (NORMODYNE) 200 MG tablet Take 2.5 tablets (500 mg total) by mouth 2 (two) times daily. 08/31/15  Yes Arnoldo Lenis, MD  LINZESS 290 MCG CAPS  capsule Take 290 mcg by mouth daily as needed (for constipation).  07/26/15  Yes Historical Provider, MD  Multiple Vitamin (MULTIVITAMIN) tablet Take 1 tablet by mouth daily.     Yes Historical Provider, MD  nicotine (NICODERM CQ - DOSED IN MG/24 HOURS) 21 mg/24hr patch Place 21 mg onto the skin daily.   Yes Historical Provider, MD  omeprazole (PRILOSEC) 20 MG capsule Take 20 mg by mouth daily.     Yes Historical Provider, MD  ramipril (ALTACE) 10 MG tablet Take 10 mg by mouth at bedtime.    Yes Historical Provider, MD  rosuvastatin (CRESTOR) 20 MG tablet TAKE ONE TABLET BY MOUTH DAILY 01/15/16  Yes Arnoldo Lenis, MD  sildenafil (REVATIO) 20 MG tablet Take 2 tablets by mouth daily as needed (erectile disfunction).  06/05/15  Yes Historical Provider, MD  torsemide (DEMADEX) 20 MG tablet Take 20 mg by mouth 2 (two) times daily.   Yes Historical Provider, MD  TRULICITY 1.5 ZJ/6.7HA SOPN INJECT 1.5 MG SUBCUTANEOUSLY ONCE every Wednesday 04/12/15  Yes Historical Provider, MD  triamcinolone cream (KENALOG) 0.1 % Apply 1 application topically daily. 03/04/16   Historical Provider, MD    Family History Family History  Problem Relation Age of Onset  . Diabetes Mother   . Alcohol abuse Father     Social History Social History  Substance Use Topics  . Smoking status: Current Every Day Smoker    Packs/day: 1.00    Years: 40.00    Types: Cigarettes    Start date: 05/20/1968  . Smokeless tobacco: Never Used  . Alcohol use No     Allergies   Patient has no known allergies.   Review of Systems Review of Systems  Constitutional: Positive for fever. Negative for appetite change and fatigue.  HENT: Negative for congestion, ear discharge and sinus pressure.   Eyes: Negative for discharge.  Respiratory: Negative for cough.   Cardiovascular: Negative for chest pain.  Gastrointestinal: Negative for abdominal pain and diarrhea.  Genitourinary: Negative for frequency and hematuria.    Musculoskeletal: Negative for back pain.  Skin: Negative for rash.  Neurological: Negative for seizures and headaches.  Psychiatric/Behavioral: Negative for hallucinations.     Physical Exam Updated Vital Signs BP 115/90   Pulse 79   Temp 99.2 F (37.3 C) (Oral)   Resp 19   Ht 6\' 4"  (1.93 m)   Wt 256 lb (116.1 kg)   SpO2 97%   BMI 31.16 kg/m   Physical Exam  Constitutional: He is oriented to person, place, and time. He appears well-developed.  HENT:  Head: Normocephalic.  Eyes: Conjunctivae and EOM are normal. No scleral icterus.  Neck: Neck supple. No thyromegaly present.  Cardiovascular: Normal rate and regular rhythm.  Exam reveals no gallop and no friction rub.   No murmur heard. Pulmonary/Chest: No stridor. He  has no wheezes. He has no rales. He exhibits no tenderness.  Abdominal: He exhibits no distension. There is no tenderness. There is no rebound.  Musculoskeletal: Normal range of motion. He exhibits no edema.  Lymphadenopathy:    He has no cervical adenopathy.  Neurological: He is oriented to person, place, and time. He exhibits normal muscle tone. Coordination normal.  Skin: No rash noted. No erythema.  Psychiatric: He has a normal mood and affect. His behavior is normal.     ED Treatments / Results  Labs (all labs ordered are listed, but only abnormal results are displayed) Labs Reviewed  CBC WITH DIFFERENTIAL/PLATELET - Abnormal; Notable for the following:       Result Value   WBC 17.4 (*)    Neutro Abs 14.9 (*)    Monocytes Absolute 1.2 (*)    All other components within normal limits  COMPREHENSIVE METABOLIC PANEL - Abnormal; Notable for the following:    Sodium 131 (*)    Potassium 2.9 (*)    Chloride 94 (*)    Glucose, Bld 115 (*)    Creatinine, Ser 2.59 (*)    Calcium 8.4 (*)    Albumin 3.1 (*)    ALT 13 (*)    GFR calc non Af Amer 25 (*)    GFR calc Af Amer 29 (*)    All other components within normal limits  URINALYSIS, ROUTINE W  REFLEX MICROSCOPIC (NOT AT [facility]) - Abnormal; Notable for the following:    Hgb urine dipstick MODERATE (*)    Protein, ur >300 (*)    All other components within normal limits  URINE MICROSCOPIC-ADD ON - Abnormal; Notable for the following:    Squamous Epithelial / LPF 0-5 (*)    Bacteria, UA RARE (*)    All other components within normal limits  CULTURE, BLOOD (ROUTINE X 2)  CULTURE, BLOOD (ROUTINE X 2)  URINE CULTURE  INFLUENZA PANEL BY PCR (TYPE A & B, H1N1)  I-STAT CG4 LACTIC ACID, ED  I-STAT CG4 LACTIC ACID, ED    EKG  EKG Interpretation None       Radiology Dg Chest 2 View  Result Date: 04/04/2016 CLINICAL DATA:  Fever starting last night. Cough and congestion. COPD. EXAM: CHEST  2 VIEW COMPARISON:  07/31/2015 FINDINGS: Atherosclerotic calcification of the aortic arch. Heart size within normal limits. Old right posterolateral rib deformities compatible with old healed fractures. Large lung volumes. No pleural effusion. Vague linear density in the left lung apex, not readily apparent previously, I favor this representing subsegmental atelectasis and given the thickness of the finding I am skeptical that this is a pleural reflection edge related to a pneumothorax. IMPRESSION: 1. Faint linear opacity at the left lung apex probably from subsegmental atelectasis. 2. Atherosclerotic aortic arch. 3. Large lung volumes, possibly from emphysema. Electronically Signed   By: Van Clines M.D.   On: 04/04/2016 13:14    Procedures Procedures (including critical care time)  Medications Ordered in ED Medications  acetaminophen (TYLENOL) tablet 650 mg (650 mg Oral Given 04/04/16 1258)  sodium chloride 0.9 % bolus 1,000 mL (0 mLs Intravenous Stopped 04/04/16 1609)  sodium chloride 0.9 % bolus 2,000 mL (2,000 mLs Intravenous New Bag/Given 04/04/16 1448)  potassium chloride SA (K-DUR,KLOR-CON) CR tablet 40 mEq (40 mEq Oral Given 04/04/16 1448)  cefTRIAXone (ROCEPHIN) 1 g in dextrose 5  % 50 mL IVPB (1 g Intravenous New Bag/Given 04/04/16 1615)  oxyCODONE-acetaminophen (PERCOCET/ROXICET) 5-325 MG per tablet 1 tablet (1  tablet Oral Given 04/04/16 1614)     Initial Impression / Assessment and Plan / ED Course  I have reviewed the triage vital signs and the nursing notes.  Pertinent labs & imaging results that were available during my care of the patient were reviewed by me and considered in my medical decision making (see chart for details).  Clinical Course     Patient with leukocytosis and hematuria with fever. He'll be cultured up started on antibiotics and will be admitted for further workup  Final Clinical Impressions(s) / ED Diagnoses   Final diagnoses:  Febrile illness    New Prescriptions New Prescriptions   No medications on file     Milton Ferguson, MD 04/04/16 1643

## 2016-04-04 NOTE — ED Notes (Signed)
ED Provider at bedside. 

## 2016-04-05 ENCOUNTER — Observation Stay (HOSPITAL_COMMUNITY): Payer: Medicare Other

## 2016-04-05 ENCOUNTER — Encounter (HOSPITAL_COMMUNITY): Payer: Self-pay

## 2016-04-05 DIAGNOSIS — E785 Hyperlipidemia, unspecified: Secondary | ICD-10-CM | POA: Diagnosis present

## 2016-04-05 DIAGNOSIS — A419 Sepsis, unspecified organism: Secondary | ICD-10-CM | POA: Diagnosis not present

## 2016-04-05 DIAGNOSIS — E871 Hypo-osmolality and hyponatremia: Secondary | ICD-10-CM | POA: Diagnosis present

## 2016-04-05 DIAGNOSIS — E1121 Type 2 diabetes mellitus with diabetic nephropathy: Secondary | ICD-10-CM | POA: Diagnosis present

## 2016-04-05 DIAGNOSIS — A408 Other streptococcal sepsis: Secondary | ICD-10-CM | POA: Diagnosis not present

## 2016-04-05 DIAGNOSIS — E1122 Type 2 diabetes mellitus with diabetic chronic kidney disease: Secondary | ICD-10-CM

## 2016-04-05 DIAGNOSIS — R197 Diarrhea, unspecified: Secondary | ICD-10-CM | POA: Diagnosis present

## 2016-04-05 DIAGNOSIS — L03116 Cellulitis of left lower limb: Secondary | ICD-10-CM | POA: Diagnosis present

## 2016-04-05 DIAGNOSIS — R3121 Asymptomatic microscopic hematuria: Secondary | ICD-10-CM

## 2016-04-05 DIAGNOSIS — L97529 Non-pressure chronic ulcer of other part of left foot with unspecified severity: Secondary | ICD-10-CM | POA: Diagnosis present

## 2016-04-05 DIAGNOSIS — E08621 Diabetes mellitus due to underlying condition with foot ulcer: Secondary | ICD-10-CM | POA: Diagnosis not present

## 2016-04-05 DIAGNOSIS — N183 Chronic kidney disease, stage 3 (moderate): Secondary | ICD-10-CM

## 2016-04-05 DIAGNOSIS — R319 Hematuria, unspecified: Secondary | ICD-10-CM | POA: Diagnosis not present

## 2016-04-05 DIAGNOSIS — R3129 Other microscopic hematuria: Secondary | ICD-10-CM | POA: Diagnosis present

## 2016-04-05 DIAGNOSIS — J449 Chronic obstructive pulmonary disease, unspecified: Secondary | ICD-10-CM | POA: Diagnosis present

## 2016-04-05 DIAGNOSIS — I2510 Atherosclerotic heart disease of native coronary artery without angina pectoris: Secondary | ICD-10-CM | POA: Diagnosis present

## 2016-04-05 DIAGNOSIS — I50810 Right heart failure, unspecified: Secondary | ICD-10-CM | POA: Diagnosis present

## 2016-04-05 DIAGNOSIS — B955 Unspecified streptococcus as the cause of diseases classified elsewhere: Secondary | ICD-10-CM | POA: Diagnosis present

## 2016-04-05 DIAGNOSIS — E11621 Type 2 diabetes mellitus with foot ulcer: Secondary | ICD-10-CM | POA: Diagnosis present

## 2016-04-05 DIAGNOSIS — K210 Gastro-esophageal reflux disease with esophagitis: Secondary | ICD-10-CM | POA: Diagnosis present

## 2016-04-05 DIAGNOSIS — E876 Hypokalemia: Secondary | ICD-10-CM | POA: Diagnosis present

## 2016-04-05 DIAGNOSIS — R509 Fever, unspecified: Secondary | ICD-10-CM | POA: Diagnosis not present

## 2016-04-05 DIAGNOSIS — E1140 Type 2 diabetes mellitus with diabetic neuropathy, unspecified: Secondary | ICD-10-CM | POA: Diagnosis present

## 2016-04-05 DIAGNOSIS — M7989 Other specified soft tissue disorders: Secondary | ICD-10-CM | POA: Diagnosis not present

## 2016-04-05 DIAGNOSIS — L97519 Non-pressure chronic ulcer of other part of right foot with unspecified severity: Secondary | ICD-10-CM

## 2016-04-05 DIAGNOSIS — F17210 Nicotine dependence, cigarettes, uncomplicated: Secondary | ICD-10-CM | POA: Diagnosis present

## 2016-04-05 DIAGNOSIS — R7881 Bacteremia: Secondary | ICD-10-CM

## 2016-04-05 DIAGNOSIS — F411 Generalized anxiety disorder: Secondary | ICD-10-CM | POA: Diagnosis present

## 2016-04-05 DIAGNOSIS — I731 Thromboangiitis obliterans [Buerger's disease]: Secondary | ICD-10-CM | POA: Diagnosis present

## 2016-04-05 DIAGNOSIS — I130 Hypertensive heart and chronic kidney disease with heart failure and stage 1 through stage 4 chronic kidney disease, or unspecified chronic kidney disease: Secondary | ICD-10-CM | POA: Diagnosis present

## 2016-04-05 LAB — IMMUNOGLOBULINS A/E/G/M, SERUM
IgA: 438 mg/dL — ABNORMAL HIGH (ref 61–437)
IgE (Immunoglobulin E), Serum: 162 IU/mL — ABNORMAL HIGH (ref 0–100)
IgG (Immunoglobin G), Serum: 1777 mg/dL — ABNORMAL HIGH (ref 700–1600)
IgM, Serum: 223 mg/dL — ABNORMAL HIGH (ref 20–172)

## 2016-04-05 LAB — COMPREHENSIVE METABOLIC PANEL
ALT: 36 U/L (ref 17–63)
AST: 53 U/L — ABNORMAL HIGH (ref 15–41)
Albumin: 2.7 g/dL — ABNORMAL LOW (ref 3.5–5.0)
Alkaline Phosphatase: 69 U/L (ref 38–126)
Anion gap: 8 (ref 5–15)
BUN: 21 mg/dL — ABNORMAL HIGH (ref 6–20)
CO2: 25 mmol/L (ref 22–32)
Calcium: 7.9 mg/dL — ABNORMAL LOW (ref 8.9–10.3)
Chloride: 99 mmol/L — ABNORMAL LOW (ref 101–111)
Creatinine, Ser: 2.33 mg/dL — ABNORMAL HIGH (ref 0.61–1.24)
GFR calc Af Amer: 33 mL/min — ABNORMAL LOW (ref >60)
GFR calc non Af Amer: 28 mL/min — ABNORMAL LOW (ref >60)
Glucose, Bld: 138 mg/dL — ABNORMAL HIGH (ref 65–99)
Potassium: 3.3 mmol/L — ABNORMAL LOW (ref 3.5–5.1)
Sodium: 132 mmol/L — ABNORMAL LOW (ref 135–145)
Total Bilirubin: 0.9 mg/dL (ref 0.3–1.2)
Total Protein: 6.8 g/dL (ref 6.5–8.1)

## 2016-04-05 LAB — CBC
HCT: 38.6 % — ABNORMAL LOW (ref 39.0–52.0)
Hemoglobin: 13 g/dL (ref 13.0–17.0)
MCH: 31.9 pg (ref 26.0–34.0)
MCHC: 33.7 g/dL (ref 30.0–36.0)
MCV: 94.6 fL (ref 78.0–100.0)
Platelets: 198 10*3/uL (ref 150–400)
RBC: 4.08 MIL/uL — ABNORMAL LOW (ref 4.22–5.81)
RDW: 14.4 % (ref 11.5–15.5)
WBC: 10.6 10*3/uL — ABNORMAL HIGH (ref 4.0–10.5)

## 2016-04-05 LAB — BLOOD CULTURE ID PANEL (REFLEXED)

## 2016-04-05 LAB — C DIFFICILE QUICK SCREEN W PCR REFLEX
C Diff antigen: NEGATIVE
C Diff interpretation: NOT DETECTED
C Diff toxin: NEGATIVE

## 2016-04-05 LAB — GLUCOSE, CAPILLARY
Glucose-Capillary: 156 mg/dL — ABNORMAL HIGH (ref 65–99)
Glucose-Capillary: 116 mg/dL — ABNORMAL HIGH (ref 65–99)
Glucose-Capillary: 109 mg/dL — ABNORMAL HIGH (ref 65–99)
Glucose-Capillary: 108 mg/dL — ABNORMAL HIGH (ref 65–99)

## 2016-04-05 LAB — TSH: TSH: 0.764 u[IU]/mL (ref 0.350–4.500)

## 2016-04-05 MED ORDER — HYDRALAZINE HCL 25 MG PO TABS
100.0000 mg | ORAL_TABLET | Freq: Two times a day (BID) | ORAL | Status: DC
  Administered 2016-04-05 – 2016-04-08 (×7): 100 mg via ORAL
  Filled 2016-04-05 (×7): qty 4

## 2016-04-05 MED ORDER — HYDROMORPHONE HCL 1 MG/ML IJ SOLN
0.5000 mg | INTRAMUSCULAR | Status: DC | PRN
  Administered 2016-04-05 – 2016-04-07 (×8): 0.5 mg via INTRAVENOUS
  Filled 2016-04-05 (×8): qty 0.5

## 2016-04-05 MED ORDER — METRONIDAZOLE IN NACL 5-0.79 MG/ML-% IV SOLN
500.0000 mg | Freq: Three times a day (TID) | INTRAVENOUS | Status: DC
  Administered 2016-04-05 – 2016-04-06 (×3): 500 mg via INTRAVENOUS
  Filled 2016-04-05 (×3): qty 100

## 2016-04-05 MED ORDER — SIMETHICONE 80 MG PO CHEW
80.0000 mg | CHEWABLE_TABLET | Freq: Four times a day (QID) | ORAL | Status: DC | PRN
  Filled 2016-04-05: qty 1

## 2016-04-05 MED ORDER — VANCOMYCIN HCL 10 G IV SOLR: 1250.0000 mg | INTRAVENOUS | Status: DC

## 2016-04-05 MED ORDER — VANCOMYCIN HCL 10 G IV SOLR
1250.0000 mg | INTRAVENOUS | Status: DC
  Administered 2016-04-05 – 2016-04-08 (×4): 1250 mg via INTRAVENOUS
  Filled 2016-04-05 (×5): qty 1250

## 2016-04-05 MED ORDER — DEXTROSE 5 % IV SOLN
2.0000 g | INTRAVENOUS | Status: DC
  Administered 2016-04-05 – 2016-04-07 (×3): 2 g via INTRAVENOUS
  Filled 2016-04-05 (×5): qty 2

## 2016-04-05 MED ORDER — LABETALOL HCL 200 MG PO TABS
100.0000 mg | ORAL_TABLET | Freq: Three times a day (TID) | ORAL | Status: DC
  Administered 2016-04-05 – 2016-04-06 (×3): 100 mg via ORAL
  Filled 2016-04-05 (×3): qty 1

## 2016-04-05 MED ORDER — SIMETHICONE 80 MG PO CHEW
80.0000 mg | CHEWABLE_TABLET | Freq: Four times a day (QID) | ORAL | Status: DC | PRN
  Administered 2016-04-05 – 2016-04-07 (×3): 80 mg via ORAL
  Filled 2016-04-05 (×2): qty 1

## 2016-04-05 MED ORDER — TORSEMIDE 20 MG PO TABS
10.0000 mg | ORAL_TABLET | Freq: Two times a day (BID) | ORAL | Status: DC
  Administered 2016-04-06 – 2016-04-07 (×3): 10 mg via ORAL
  Filled 2016-04-05 (×3): qty 1

## 2016-04-05 MED ORDER — POTASSIUM CHLORIDE IN NACL 40-0.9 MEQ/L-% IV SOLN
INTRAVENOUS | Status: DC
  Administered 2016-04-05: 75 mL/h via INTRAVENOUS
  Administered 2016-04-06: 70 mL/h via INTRAVENOUS

## 2016-04-05 NOTE — Care Management Obs Status (Signed)
[facility] NOTIFICATION   Patient Details  Name: [name] MRN: [id number] Date of Birth: 07/24/1950   Medicare Observation Status Notification Given:  Yes    Sherald Barge, RN 04/05/2016, 9:05 AM

## 2016-04-05 NOTE — Consult Note (Addendum)
WOC wound consult note Reason for Consult: Consult requested for bilat foot wounds, pt is familiar to [facility] team from previous consult in May.  He states the previous wounds healed, but then recently returned. Consult performed via remote camera with nurse's assistance to measure and assess the wounds. Pt states he is followed by Dr [name], podiatrist, as an outpatient, who has ordered Collagen dressings.  This topical treatment is not available in the [place] and pt states his wife will be able to bring it in from home to use daily.   Left leg with cellulitis; generalized edema and erythemia, no open wound. Wound type: Right plantar foot with full thickness wound Measurement: Open area 1X.5X.1cm,  Inner wound bed is 50% red, 50% yellow, small amt yellow drainage, no odor Periwound: Surrounded by dry calloused skin which has been trimmed previously.  Left plantar foot with full thickness wound Measurement: Open area 2X1X.5cm, red  Drainage (amount, consistency, odor) No odor, small amt yellow drainage  Periwound: Surrounded by dry calloused skin which has been trimmed previously.   Dressing procedure/placement/frequency: Patient is well informed regarding topical treatment. Continue present plan of care with Collagen dressings Q day. He states he will follow-up with the podiatrist after discharge and denies further questions. If source of sepsis is a concern then consider x-ray or MRI to r/o osteomyelitis, since this can always be a possible scenario, but wounds do not display outward signs of infection at this time. Discussed plan of care with bedside nurse, patient and his wife via remote camera. Please re-consult if further assistance is needed.  Thank-you,  Julien Girt MSN, [place], [place], [place], [place]

## 2016-04-05 NOTE — Progress Notes (Signed)
PHARMACY - PHYSICIAN COMMUNICATION CRITICAL VALUE ALERT - BLOOD CULTURE IDENTIFICATION (BCID)  Results for orders placed or performed during the hospital encounter of 04/04/16  Blood Culture ID Panel (Reflexed) (Collected: 04/04/2016  1:21 PM)  Result Value Ref Range   Enterococcus species NOT DETECTED NOT DETECTED   Listeria monocytogenes NOT DETECTED NOT DETECTED   Staphylococcus species NOT DETECTED NOT DETECTED   Staphylococcus aureus NOT DETECTED NOT DETECTED   Streptococcus species DETECTED (A) NOT DETECTED   Streptococcus agalactiae NOT DETECTED NOT DETECTED   Streptococcus pneumoniae NOT DETECTED NOT DETECTED   Streptococcus pyogenes NOT DETECTED NOT DETECTED   Acinetobacter baumannii NOT DETECTED NOT DETECTED   Enterobacteriaceae species NOT DETECTED NOT DETECTED   Enterobacter cloacae complex NOT DETECTED NOT DETECTED   Escherichia coli NOT DETECTED NOT DETECTED   Klebsiella oxytoca NOT DETECTED NOT DETECTED   Klebsiella pneumoniae NOT DETECTED NOT DETECTED   Proteus species NOT DETECTED NOT DETECTED   Serratia marcescens NOT DETECTED NOT DETECTED   Haemophilus influenzae NOT DETECTED NOT DETECTED   Neisseria meningitidis NOT DETECTED NOT DETECTED   Pseudomonas aeruginosa NOT DETECTED NOT DETECTED   Candida albicans NOT DETECTED NOT DETECTED   Candida glabrata NOT DETECTED NOT DETECTED   Candida krusei NOT DETECTED NOT DETECTED   Candida parapsilosis NOT DETECTED NOT DETECTED   Candida tropicalis NOT DETECTED NOT DETECTED    Name of physician (or Provider) Contacted: Dr. Caryn Section  Patient currently on Cefepime and Vancomycin(levaquin discontinued). Strep species identified in 1 of 2 bottles of the blood cx. Recommend deescalate antibiotic to Ceftriaxone 2gm IV q24h.   Isac Sarna, BS Pharm D, [place] Clinical Pharmacist Pager [phone] 04/05/2016  11:28 AM

## 2016-04-05 NOTE — Care Management Note (Signed)
Case Management Note  Patient Details  Name: OLYN LANDSTROM MRN: [id number] Date of Birth: 04-Apr-1951   Expected Discharge Date:  04/06/16               Expected Discharge Plan:  Home/Self Care  In-House Referral:  NA  Discharge planning Services  CM Consult  Post Acute Care Choice:  NA Choice offered to:  NA  DME Arranged:    DME Agency:     HH Arranged:    [place] Agency:     Status of Service:  Completed, signed off  If discussed at [employer] of Stay Meetings, dates discussed:    Additional Comments: Chart reviewed for CM needs. No CM needs identified.   Lareta Bruneau, [place]un[place]y [place], RN 04/05/2016, 1:30 PM

## 2016-04-05 NOTE — Plan of Care (Addendum)
Patient continues to feel awful.  He's diaphoretic, pale, uncontrolably shaky, nauseous and complaining of general pain everywhere 10+. Pain and nausea meds given.  Vitals confirmed and rectal temp completed.  Dr. Ree Kida.

## 2016-04-05 NOTE — Progress Notes (Signed)
PROGRESS NOTE    [name]  GGY:[id number] DOB: 01-20-1951 DOA: 04/04/2016 PCP: [name], MD  Podiatry-Dr. [name]   Brief Narrative:  [name] is an 65 y.o. male with hx of CAD, DM2, obesity, HLD, CKD3, CHF on diuretics, COPD with tobacco abuse, presented to the ER with sudden onset of subjective fever, rigors, and non productive coughs.  He had no dysuria, abdominal pain, nausea, vomiting, sore throat, HA, stiff neck or diarrhea. His wife of 6 weeks, an Therapist, sports, said he does get sick quite quickly.  Evaluation in the ER showed negative influenza, clear CXR, and UA with hematuria. His WBC was 17K, and his lactic acid was 0.71. His vital signs were stable. He was given IV Rocephin, and hospitalist was asked to admit him for sepsis of an unclear source.    Assessment & Plan:   Principal Problem:   Sepsis ([place]) Active Problems:   Hematuria   Diarrhea   Bilateral diabetic foot ulcer associated with secondary diabetes mellitus ([place])   Generalized anxiety disorder   TOBACCO ABUSE   BUERGER'S DISEASE   Hypertension   CKD stage 3 due to type 2 diabetes mellitus (H[facility])   Hypokalemia   Diabetes mellitus with nephropathy ([facility])   Hyponatremia    1. Sepsis. On admission, blood cultures and a urine culture were ordered. He was started on broad-spectrum antibiotics with Levaquin, cefepime, and vancomycin. One out of 2 blood cultures have become positive for Streptococcus species. Specific identification is unknown at this time. Potential etiology of the Streptococcus bacteremia include cellulitis and less likely infected plantar ulcers of the feet. We'll also consider a diarrheal/enteric illness as the patient has had watery stools in the setting of recent outpatient clindamycin treatment for plantar ulcerations. -We will change antibiotic therapy to Rocephin 2 g daily; discontinue cefepime and Levaquin. Will add metronidazole empirically. -His white blood cell count is trending  toward normal.  Diarrhea. Patient reports small watery stools several times daily over the past few days. He had been treated with clindamycin in the outpatient setting for plantar wound infection by his podiatrist, Dr. [name]. -As above, will start IV metronidazole empirically (due to the patient's nausea) and will order C. difficile toxin PCR.  Plantar ulcers. On exam, these do not appear to be actively infected. Wound care was consulted and her assessment and recommendations noted and appreciated.  Microhematuria. Patient was noted to have hematuria on urinalysis, but no pyuria. -We'll order renal ultrasound for evaluation.  Hyponatremia. Patient was started on gentle IV fluids. There has been minimal improvement in his serum sodium. Will adjust IV fluids and monitor.  Stage III chronic kidney disease. The patient's creatinine is at baseline. He is treated with torsemide chronically, currently on hold due to volume depletion.  Hypokalemia. Patient's serum potassium was 2.9 on admission. Potassium chloride was started with some improvement. Will continue potassium chloride supplementation. Will order magnesium level for evaluation.  Hypertension. Patient is chronically treated with amlodipine, hydrochlorothiazide, labetalol, ramipril, and carvedilol and hydralazine. All were held on admission due to hypotension. His blood pressure is trending up. -We'll restart all of his antihypertensive medications with exception of amlodipine, coreg and hydrochlorothiazide.  Diabetes mellitus with nephropathy and neuropathy. Patient was started on sliding scale NovoLog and Lantus. Will adjust insulin for better glycemic control. Will order hemoglobin A1c.  DVT prophylaxis: Subcutaneous heparin Code Status: Full code Family Communication: Discussed with his wife Disposition Plan: Discharged home in clinically appropriate   Consultants:  None  Procedures:     Antimicrobials:    Rocephin 11/17>>  Vancomycin 11/16>>  Flagyl 11/17>>  Cefepime 11/16-11/17  Levaquin 11/16-11/17   Subjective: Patient complains of subjective chills and nausea. He says that he feels bad.  Objective: Vitals:   04/04/16 2022 04/04/16 2053 04/05/16 0538 04/05/16 0950  BP: (!) 153/66  135/63 (!) 141/80  Pulse: 84  66 (!) 58  Resp: 20  20 (!) 21  Temp: 99.8 F (37.7 C)  97.5 F (36.4 C) 98.9 F (37.2 C)  TempSrc: Oral  Oral Rectal  SpO2: 96% 93% 99% 100%  Weight: 110.7 kg (244 lb 1.6 oz)  117.4 kg (258 lb 14.4 oz)   Height:        Intake/Output Summary (Last 24 hours) at 04/05/16 1047 Last data filed at 04/05/16 0852  Gross per 24 hour  Intake             3530 ml  Output             1200 ml  Net             2330 ml   Filed Weights   04/04/16 1205 04/04/16 2022 04/05/16 0538  Weight: 116.1 kg (256 lb) 110.7 kg (244 lb 1.6 oz) 117.4 kg (258 lb 14.4 oz)    Examination:  General exam: Appears calm, but ill and uncomfortable.  Oropharynx without exudates or erythema of the posterior pharynx. Tympanic membranes bilaterally clear. Respiratory system: Clear to auscultation. Respiratory effort normal. Cardiovascular system: S1 & S2 heard, RRR. No JVD, murmurs, rubs, gallops or clicks. Trace of bilateral pedal edema Gastrointestinal system: Abdomen is nondistended, soft and nontender. No organomegaly or masses felt. Normal bowel sounds heard. Central nervous system: Alert and oriented. No focal neurological deficits. Extremities: Symmetric 5 x 5 power. Skin: Mild diffuse erythema below the left knee extending to the ankle; healing bilateral plantar ulcers without malodorous drainage; both surrounded by callus. Psychiatry: Judgement and insight appear normal. Mood & affect appropriate.     Data Reviewed: I have personally reviewed following labs and imaging studies  CBC:  Recent Labs Lab 04/04/16 1253 04/05/16 0624  WBC 17.4* 10.6*  NEUTROABS 14.9*  --   HGB  14.3 13.0  HCT 41.3 38.6*  MCV 94.3 94.6  PLT 223 751   Basic Metabolic Panel:  Recent Labs Lab 04/04/16 1253 04/05/16 0624  NA 131* 132*  K 2.9* 3.3*  CL 94* 99*  CO2 27 25  GLUCOSE 115* 138*  BUN 20 21*  CREATININE 2.59* 2.33*  CALCIUM 8.4* 7.9*   GFR: Estimated Creatinine Clearance: 45.4 mL/min (by C-G formula based on SCr of 2.33 mg/dL (H)). Liver Function Tests:  Recent Labs Lab 04/04/16 1253 04/05/16 0624  AST 19 53*  ALT 13* 36  ALKPHOS 52 69  BILITOT 1.0 0.9  PROT 7.7 6.8  ALBUMIN 3.1* 2.7*   No results for input(s): LIPASE, AMYLASE in the last 168 hours. No results for input(s): AMMONIA in the last 168 hours. Coagulation Profile: No results for input(s): INR, PROTIME in the last 168 hours. Cardiac Enzymes: No results for input(s): CKTOTAL, CKMB, CKMBINDEX, TROPONINI in the last 168 hours. BNP (last 3 results) No results for input(s): PROBNP in the last 8760 hours. HbA1C: No results for input(s): HGBA1C in the last 72 hours. CBG:  Recent Labs Lab 04/04/16 2126 04/05/16 0817  GLUCAP 160* 156*   Lipid Profile: No results for input(s): CHOL, HDL, LDLCALC, TRIG, CHOLHDL, LDLDIRECT in the  last 72 hours. Thyroid Function Tests:  Recent Labs  04/05/16 0624  TSH 0.764   Anemia Panel: No results for input(s): VITAMINB12, FOLATE, FERRITIN, TIBC, IRON, RETICCTPCT in the last 72 hours. Sepsis Labs:  Recent Labs Lab 04/04/16 1258 04/04/16 1605  LATICACIDVEN 1.89 0.71    Recent Results (from the past 240 hour(s))  Blood culture (routine x 2)     Status: None (Preliminary result)   Collection Time: 04/04/16  1:13 PM  Result Value Ref Range Status   Specimen Description BLOOD RIGHT ANTECUBITAL  Final   Special Requests   Final    BOTTLES DRAWN AEROBIC AND ANAEROBIC AEB=10CC ANA=7CC   Culture PENDING  Incomplete   Report Status PENDING  Incomplete  Blood culture (routine x 2)     Status: None (Preliminary result)   Collection Time: 04/04/16   1:21 PM  Result Value Ref Range Status   Specimen Description BLOOD LEFT HAND  Final   Special Requests BOTTLES DRAWN AEROBIC AND ANAEROBIC 6CC  Final   Culture  Setup Time   Final    GRAM POSITIVE COCCI in chains and pairs recovered from the anaerobic bottle Gram Stain Report Called to,Read Back By and Verified With: Hurteau,L at 710am by H Flynt 04/05/16 Performed at [place] ID to follow Performed at [facility]    Culture PENDING  Incomplete   Report Status PENDING  Incomplete         Radiology Studies: Dg Chest 2 View  Result Date: 04/04/2016 CLINICAL DATA:  Fever starting last night. Cough and congestion. COPD. EXAM: CHEST  2 VIEW COMPARISON:  07/31/2015 FINDINGS: Atherosclerotic calcification of the aortic arch. Heart size within normal limits. Old right posterolateral rib deformities compatible with old healed fractures. Large lung volumes. No pleural effusion. Vague linear density in the left lung apex, not readily apparent previously, I favor this representing subsegmental atelectasis and given the thickness of the finding I am skeptical that this is a pleural reflection edge related to a pneumothorax. IMPRESSION: 1. Faint linear opacity at the left lung apex probably from subsegmental atelectasis. 2. Atherosclerotic aortic arch. 3. Large lung volumes, possibly from emphysema. Electronically Signed   By: Van Clines M.D.   On: 04/04/2016 13:14        Scheduled Meds: . aspirin  81 mg Oral Daily  . ceFEPime (MAXIPIME) IV  1 g Intravenous Q12H  . heparin  5,000 Units Subcutaneous Q8H  . insulin aspart  0-15 Units Subcutaneous TID WC  . insulin aspart  0-5 Units Subcutaneous QHS  . insulin glargine  10 Units Subcutaneous QHS  . levofloxacin (LEVAQUIN) IV  750 mg Intravenous Q24H  . nicotine  21 mg Transdermal Daily  . pantoprazole  40 mg Oral Daily  . rosuvastatin  20 mg Oral q1800  . sodium chloride flush  3 mL Intravenous Q12H  .  vancomycin  1,250 mg Intravenous Q24H   Continuous Infusions: . sodium chloride 75 mL/hr at 04/04/16 2127     LOS: 0 days    Time spent: 15 minutes    Rexene Alberts, MD [facility] Pager [phone]   If 7PM-7AM, please contact night-coverage [email or web address] Password [email or web address] 04/05/2016, 10:47 AM

## 2016-04-06 DIAGNOSIS — E876 Hypokalemia: Secondary | ICD-10-CM

## 2016-04-06 LAB — CBC
HCT: 36.1 % — ABNORMAL LOW (ref 39.0–52.0)
Hemoglobin: 12.2 g/dL — ABNORMAL LOW (ref 13.0–17.0)
MCH: 32.1 pg (ref 26.0–34.0)
MCHC: 33.8 g/dL (ref 30.0–36.0)
MCV: 95 fL (ref 78.0–100.0)
Platelets: 180 10*3/uL (ref 150–400)
RBC: 3.8 MIL/uL — ABNORMAL LOW (ref 4.22–5.81)
RDW: 14.4 % (ref 11.5–15.5)
WBC: 7.8 10*3/uL (ref 4.0–10.5)

## 2016-04-06 LAB — BASIC METABOLIC PANEL
Anion gap: 5 (ref 5–15)
BUN: 20 mg/dL (ref 6–20)
CO2: 25 mmol/L (ref 22–32)
Calcium: 8.1 mg/dL — ABNORMAL LOW (ref 8.9–10.3)
Chloride: 105 mmol/L (ref 101–111)
Creatinine, Ser: 2.19 mg/dL — ABNORMAL HIGH (ref 0.61–1.24)
GFR calc Af Amer: 35 mL/min — ABNORMAL LOW (ref >60)
GFR calc non Af Amer: 30 mL/min — ABNORMAL LOW (ref >60)
Glucose, Bld: 94 mg/dL (ref 65–99)
Potassium: 3.2 mmol/L — ABNORMAL LOW (ref 3.5–5.1)
Sodium: 135 mmol/L (ref 135–145)

## 2016-04-06 LAB — GLUCOSE, CAPILLARY
Glucose-Capillary: 102 mg/dL — ABNORMAL HIGH (ref 65–99)
Glucose-Capillary: 135 mg/dL — ABNORMAL HIGH (ref 65–99)
Glucose-Capillary: 121 mg/dL — ABNORMAL HIGH (ref 65–99)

## 2016-04-06 LAB — TSH: TSH: 0.539 u[IU]/mL (ref 0.350–4.500)

## 2016-04-06 LAB — URINE CULTURE: Culture: NO GROWTH

## 2016-04-06 LAB — MAGNESIUM: Magnesium: 2 mg/dL (ref 1.7–2.4)

## 2016-04-06 MED ORDER — AMLODIPINE BESYLATE 5 MG PO TABS
10.0000 mg | ORAL_TABLET | Freq: Every day | ORAL | Status: DC
  Administered 2016-04-06 – 2016-04-08 (×3): 10 mg via ORAL
  Filled 2016-04-06 (×3): qty 2

## 2016-04-06 MED ORDER — CARVEDILOL 12.5 MG PO TABS
25.0000 mg | ORAL_TABLET | Freq: Two times a day (BID) | ORAL | Status: DC
  Administered 2016-04-06 – 2016-04-08 (×4): 25 mg via ORAL
  Filled 2016-04-06 (×4): qty 2

## 2016-04-06 MED ORDER — RAMIPRIL 5 MG PO CAPS
5.0000 mg | ORAL_CAPSULE | Freq: Every day | ORAL | Status: DC
  Administered 2016-04-06 – 2016-04-08 (×3): 5 mg via ORAL
  Filled 2016-04-06 (×3): qty 1

## 2016-04-06 MED ORDER — POTASSIUM CHLORIDE CRYS ER 20 MEQ PO TBCR
30.0000 meq | EXTENDED_RELEASE_TABLET | Freq: Two times a day (BID) | ORAL | Status: AC
  Administered 2016-04-06 (×2): 30 meq via ORAL
  Filled 2016-04-06 (×2): qty 1

## 2016-04-06 MED ORDER — LABETALOL HCL 200 MG PO TABS: 300.0000 mg | ORAL_TABLET | Freq: Two times a day (BID) | ORAL | Status: DC

## 2016-04-06 NOTE — Progress Notes (Addendum)
PROGRESS NOTE    [name]  DJM:[id number] DOB: [birth year] DOA: 04/04/2016 PCP: [name], MD  Podiatry-Dr. [name]   Brief Narrative:  [name] is an 65 y.o. male with hx of CAD, DM2, obesity, HLD, CKD3, CHF on diuretics, COPD with tobacco abuse, presented to the ER with sudden onset of subjective fever, rigors, and non productive coughs.  He had no dysuria, abdominal pain, nausea, vomiting, sore throat, HA, stiff neck or diarrhea. His wife of 6 weeks, an Therapist, sports, said he does get sick quite quickly.  Evaluation in the ER showed negative influenza, clear CXR, and UA with hematuria. His WBC was 17K, and his lactic acid was 0.71. His vital signs were stable. He was given IV Rocephin, and hospitalist was asked to admit him for sepsis.    Assessment & Plan:   Principal Problem:   Sepsis ([place]) Active Problems:   Streptococcal bacteremia   Hematuria   Diarrhea   Bilateral diabetic foot ulcer associated with secondary diabetes mellitus (HCC)   Cellulitis of leg, left   Generalized anxiety disorder   TOBACCO ABUSE   BUERGER'S DISEASE   Hypertension   CKD stage 3 due to type 2 diabetes mellitus (HCC)   Hypokalemia   Diabetes mellitus with nephropathy (HCC)   Hyponatremia    1. Sepsis, secondary to group G Streptococcus. On admission, blood cultures and a urine culture were ordered. He was started on broad-spectrum antibiotics with Levaquin, cefepime, and vancomycin. Influenza PCR was negative. One out of 2 blood cultures have become positive for Streptococcus species-Streptococcus group G. Potential etiology of the Streptococcus bacteremia include cellulitis and less likely infected plantar ulcers of the feet. C. difficile PCR was negative. -Antibiotic therapy changed to Rocephin 2 g daily and cefepime and Levaquin were discontinued. -We'll discontinue Flagyl as the C. difficile was negative. -Patient appears to be improving symptomatically. His blood pressure has  normalized. His fever curve has trended down.  Diarrhea. Patient reports small watery stools several times daily over the past few days. He had been treated with clindamycin in the outpatient setting for plantar wound infection by his podiatrist, Dr. [name]. IV Flagyl started empirically. C. difficile toxin was ordered and was negative. Flagyl was discontinued.  Plantar ulcers. On exam, these do not appear to be actively infected, but could be a nidus for infection/cellulitis.. Wound care was consulted and her assessment and recommendations noted and appreciated.  Left lower extremity cellulitis. The patient had a biopsy of a cellulitic lesion recently. Per his account, the biopsy revealed some type of arthropod bite. Since that time, he has had redness of his left leg. -Patient was started on antibiotics as discussed above. Will continue Rocephin and vancomycin for now.  Microhematuria. Patient was noted to have hematuria on urinalysis, but no pyuria. Renal ultrasound was ordered and revealed no hydronephrosis, vascular calcification versus intrarenal calculus in the left kidney, and mild renal parenchymal thinning of the left kidney. Patient is asymptomatic. Recommend outpatient monitoring.  Hyponatremia. Patient was started on gentle IV fluids. His torsemide was withheld. With IV fluids, his serum sodium has improved and is within normal limits. Will decrease IV fluid rate. His TSH was within normal limits.  Stage III chronic kidney disease. The patient's creatinine is at baseline. He is treated with torsemide chronically. It was held on admission due to volume depletion and early sepsis. It was restarted at half the dose.  Hypokalemia. Patient's serum potassium was 2.9 on admission. Potassium chloride was started with  some improvement. Will continue potassium chloride supplementation. His magnesium level was within normal limits. The hypokalemia is likely from  torsemide.  Hypertension. Patient is chronically treated with amlodipine, ramipril, and carvedilol and hydralazine. Patient stated that labetalol and hydrochlorothiazide (listed on his admission medication list) were discontinued. All were held on admission due to hypotension. His blood pressure started trending up, so some of his medications were restarted. -Labetalol was discontinued and carvedilol was started per my discussion about his outpatient regimen. Norvasc was restarted. Ramipri was restarted a lower dose. We'll continue hydralazine.   Diabetes mellitus with nephropathy and neuropathy. Patient is treated chronically with Trulicity, Invokana and Lantus. Patient was started on sliding scale NovoLog and Lantus. Will adjust insulin for better glycemic control.  -His CBGs have improved.  -Will order hemoglobin A1c.  Tobacco abuse. Patient was instructed to stop smoking. Nicotine patch was ordered.  DVT prophylaxis: Subcutaneous heparin Code Status: Full code Family Communication: Discussed with his wife Disposition Plan: Discharged home in clinically appropriate   Consultants:   None  Procedures:   None  Antimicrobials:   Rocephin 11/17>>  Vancomycin 11/16>>  Flagyl 11/17>> 11/18  Cefepime 11/16-11/17  Levaquin 11/16-11/17   Subjective: Patient had a good night. He denies recent fever or chills. His appetite has improved.  Objective: Vitals:   04/05/16 1454 04/05/16 1750 04/05/16 2126 04/06/16 0505  BP: (!) 141/80 (!) 142/86 (!) 152/73 (!) 152/62  Pulse: 69 72 74 75  Resp: 17  20 20   Temp: 98.2 F (36.8 C)  99.3 F (37.4 C) 98.7 F (37.1 C)  TempSrc: Oral  Oral Oral  SpO2: 99%  99% 96%  Weight:      Height:        Intake/Output Summary (Last 24 hours) at 04/06/16 1014 Last data filed at 04/06/16 0900  Gross per 24 hour  Intake          2774.67 ml  Output                0 ml  Net          2774.67 ml   Filed Weights   04/04/16 1205 04/04/16  2022 04/05/16 0538  Weight: 116.1 kg (256 lb) 110.7 kg (244 lb 1.6 oz) 117.4 kg (258 lb 14.4 oz)    Examination:  General exam: Appears calm, but ill and uncomfortable.  Respiratory system: Clear to auscultation. Respiratory effort normal. Cardiovascular system: S1 & S2 heard, RRR. No JVD, murmurs, rubs, gallops or clicks. Trace of bilateral pedal edema Gastrointestinal system: Abdomen is nondistended, soft and nontender. No organomegaly or masses felt. Normal bowel sounds heard. Central nervous system: Alert and oriented. No focal neurological deficits. Extremities: Symmetric 5 x 5 power. Skin: Left lower extremity-Mild diffuse erythema below the left knee extending to the ankle; healing bilateral plantar ulcers without malodorous drainage; both surrounded by callus. Psychiatry: Judgement and insight appear normal. Mood & affect appropriate.     Data Reviewed: I have personally reviewed following labs and imaging studies  CBC:  Recent Labs Lab 04/04/16 1253 04/05/16 0624 04/06/16 0637  WBC 17.4* 10.6* 7.8  NEUTROABS 14.9*  --   --   HGB 14.3 13.0 12.2*  HCT 41.3 38.6* 36.1*  MCV 94.3 94.6 95.0  PLT 223 198 937   Basic Metabolic Panel:  Recent Labs Lab 04/04/16 1253 04/05/16 0624 04/06/16 0637  NA 131* 132* 135  K 2.9* 3.3* 3.2*  CL 94* 99* 105  CO2 27 25 25   GLUCOSE  115* 138* 94  BUN 20 21* 20  CREATININE 2.59* 2.33* 2.19*  CALCIUM 8.4* 7.9* 8.1*   GFR: Estimated Creatinine Clearance: 48.3 mL/min (by C-G formula based on SCr of 2.19 mg/dL (H)). Liver Function Tests:  Recent Labs Lab 04/04/16 1253 04/05/16 0624  AST 19 53*  ALT 13* 36  ALKPHOS 52 69  BILITOT 1.0 0.9  PROT 7.7 6.8  ALBUMIN 3.1* 2.7*   No results for input(s): LIPASE, AMYLASE in the last 168 hours. No results for input(s): AMMONIA in the last 168 hours. Coagulation Profile: No results for input(s): INR, PROTIME in the last 168 hours. Cardiac Enzymes: No results for input(s): CKTOTAL,  CKMB, CKMBINDEX, TROPONINI in the last 168 hours. BNP (last 3 results) No results for input(s): PROBNP in the last 8760 hours. HbA1C: No results for input(s): HGBA1C in the last 72 hours. CBG:  Recent Labs Lab 04/05/16 0817 04/05/16 1107 04/05/16 1647 04/05/16 2152 04/06/16 0807  GLUCAP 156* 116* 109* 108* 102*   Lipid Profile: No results for input(s): CHOL, HDL, LDLCALC, TRIG, CHOLHDL, LDLDIRECT in the last 72 hours. Thyroid Function Tests:  Recent Labs  04/05/16 0624  TSH 0.764   Anemia Panel: No results for input(s): VITAMINB12, FOLATE, FERRITIN, TIBC, IRON, RETICCTPCT in the last 72 hours. Sepsis Labs:  Recent Labs Lab 04/04/16 1258 04/04/16 1605  LATICACIDVEN 1.89 0.71    Recent Results (from the past 240 hour(s))  Blood culture (routine x 2)     Status: None (Preliminary result)   Collection Time: 04/04/16  1:13 PM  Result Value Ref Range Status   Specimen Description BLOOD RIGHT ANTECUBITAL  Final   Special Requests   Final    BOTTLES DRAWN AEROBIC AND ANAEROBIC AEB=10CC ANA=7CC   Culture NO GROWTH < 24 HOURS  Final   Report Status PENDING  Incomplete  Blood culture (routine x 2)     Status: None (Preliminary result)   Collection Time: 04/04/16  1:21 PM  Result Value Ref Range Status   Specimen Description BLOOD LEFT HAND  Final   Special Requests BOTTLES DRAWN AEROBIC AND ANAEROBIC 6CC  Final   Culture  Setup Time   Final    GRAM POSITIVE COCCI in chains and pairs recovered from the anaerobic bottle Gram Stain Report Called to,Read Back By and Verified With: Hurteau,L at 710am by H Flynt 04/05/16 Performed at [place], READ BACK BY AND VERIFIED WITH: L PITTMAN 04/05/16 @ 1053 M VESTAL Performed at [place]  Final   Report Status PENDING  Incomplete  Blood Culture ID Panel (Reflexed)     Status: Abnormal   Collection Time: 04/04/16  1:21 PM  Result Value Ref Range Status    Enterococcus species NOT DETECTED NOT DETECTED Final   Listeria monocytogenes NOT DETECTED NOT DETECTED Final   Staphylococcus species NOT DETECTED NOT DETECTED Final   Staphylococcus aureus NOT DETECTED NOT DETECTED Final   Streptococcus species DETECTED (A) NOT DETECTED Final    Comment: CRITICAL RESULT CALLED TO, READ BACK BY AND VERIFIED WITH: L PITTMAN 04/05/16 @ 1053 M VESTAL    Streptococcus agalactiae NOT DETECTED NOT DETECTED Final   Streptococcus pneumoniae NOT DETECTED NOT DETECTED Final   Streptococcus pyogenes NOT DETECTED NOT DETECTED Final   Acinetobacter baumannii NOT DETECTED NOT DETECTED Final   Enterobacteriaceae species NOT DETECTED NOT DETECTED Final   Enterobacter cloacae complex NOT DETECTED NOT DETECTED Final   Escherichia  coli NOT DETECTED NOT DETECTED Final   Klebsiella oxytoca NOT DETECTED NOT DETECTED Final   Klebsiella pneumoniae NOT DETECTED NOT DETECTED Final   Proteus species NOT DETECTED NOT DETECTED Final   Serratia marcescens NOT DETECTED NOT DETECTED Final   Haemophilus influenzae NOT DETECTED NOT DETECTED Final   Neisseria meningitidis NOT DETECTED NOT DETECTED Final   Pseudomonas aeruginosa NOT DETECTED NOT DETECTED Final   Candida albicans NOT DETECTED NOT DETECTED Final   Candida glabrata NOT DETECTED NOT DETECTED Final   Candida krusei NOT DETECTED NOT DETECTED Final   Candida parapsilosis NOT DETECTED NOT DETECTED Final   Candida tropicalis NOT DETECTED NOT DETECTED Final    Comment: Performed at [facility]  Urine culture     Status: None   Collection Time: 04/04/16  2:40 PM  Result Value Ref Range Status   Specimen Description URINE, CLEAN CATCH  Final   Special Requests NONE  Final   Culture NO GROWTH Performed at [facility]   Final   Report Status 04/06/2016 FINAL  Final  C difficile quick scan w PCR reflex     Status: None   Collection Time: 04/05/16  3:40 PM  Result Value Ref Range Status   C Diff antigen  NEGATIVE NEGATIVE Final   C Diff toxin NEGATIVE NEGATIVE Final   C Diff interpretation No C. difficile detected.  Final         Radiology Studies: Dg Chest 2 View  Result Date: 04/04/2016 CLINICAL DATA:  Fever starting last night. Cough and congestion. COPD. EXAM: CHEST  2 VIEW COMPARISON:  07/31/2015 FINDINGS: Atherosclerotic calcification of the aortic arch. Heart size within normal limits. Old right posterolateral rib deformities compatible with old healed fractures. Large lung volumes. No pleural effusion. Vague linear density in the left lung apex, not readily apparent previously, I favor this representing subsegmental atelectasis and given the thickness of the finding I am skeptical that this is a pleural reflection edge related to a pneumothorax. IMPRESSION: 1. Faint linear opacity at the left lung apex probably from subsegmental atelectasis. 2. Atherosclerotic aortic arch. 3. Large lung volumes, possibly from emphysema. Electronically Signed   By: Van Clines M.D.   On: 04/04/2016 13:14   US Renal  Result Date: 04/05/2016 CLINICAL DATA:  Hematuria. EXAM: RENAL / URINARY TRACT ULTRASOUND COMPLETE COMPARISON:  09/13/2015 FINDINGS: Right Kidney: Length: 13.0 cm. Echogenicity within normal limits. No mass or hydronephrosis visualized. Left Kidney: Length: 13.8 cm. Echogenicity is normal. Suspect mild renal parenchymal thinning. In the mid aspect of the left renal pelvis and may represent a vascular calcification or intrarenal stone. Bladder: Appears normal for degree of bladder distention. IMPRESSION: 1. No hydronephrosis. 2. Vascular calcification versus intrarenal calculus in the mid pole region of the left kidney. 3. Mild renal parenchymal thinning of the left kidney. Electronically Signed   By: Nolon Nations M.D.   On: 04/05/2016 15:42        Scheduled Meds: . aspirin  81 mg Oral Daily  . cefTRIAXone (ROCEPHIN)  IV  2 g Intravenous Q24H  . heparin  5,000 Units  Subcutaneous Q8H  . hydrALAZINE  100 mg Oral BID  . insulin aspart  0-15 Units Subcutaneous TID WC  . insulin aspart  0-5 Units Subcutaneous QHS  . insulin glargine  10 Units Subcutaneous QHS  . labetalol  100 mg Oral TID  . metronidazole  500 mg Intravenous Q8H  . nicotine  21 mg Transdermal Daily  . pantoprazole  40 mg Oral Daily  . rosuvastatin  20 mg Oral q1800  . sodium chloride flush  3 mL Intravenous Q12H  . torsemide  10 mg Oral BID  . vancomycin  1,250 mg Intravenous Q24H   Continuous Infusions: . 0.9 % NaCl with KCl 40 mEq / L 70 mL/hr (04/06/16 0545)     LOS: 1 day    Time spent: 58 minutes    Rexene Alberts, MD [facility] Pager [phone]   If 7PM-7AM, please contact night-coverage [email or web address] Password [email or web address] 04/06/2016, 10:14 AM

## 2016-04-07 ENCOUNTER — Inpatient Hospital Stay (HOSPITAL_COMMUNITY): Payer: Medicare Other

## 2016-04-07 LAB — BASIC METABOLIC PANEL
Anion gap: 7 (ref 5–15)
BUN: 20 mg/dL (ref 6–20)
CO2: 23 mmol/L (ref 22–32)
Calcium: 8.4 mg/dL — ABNORMAL LOW (ref 8.9–10.3)
Chloride: 108 mmol/L (ref 101–111)
Creatinine, Ser: 2.17 mg/dL — ABNORMAL HIGH (ref 0.61–1.24)
GFR calc Af Amer: 35 mL/min — ABNORMAL LOW (ref >60)
GFR calc non Af Amer: 31 mL/min — ABNORMAL LOW (ref >60)
Glucose, Bld: 83 mg/dL (ref 65–99)
Potassium: 3.8 mmol/L (ref 3.5–5.1)
Sodium: 138 mmol/L (ref 135–145)

## 2016-04-07 LAB — GLUCOSE, CAPILLARY
Glucose-Capillary: 130 mg/dL — ABNORMAL HIGH (ref 65–99)
Glucose-Capillary: 143 mg/dL — ABNORMAL HIGH (ref 65–99)
Glucose-Capillary: 156 mg/dL — ABNORMAL HIGH (ref 65–99)
Glucose-Capillary: 125 mg/dL — ABNORMAL HIGH (ref 65–99)

## 2016-04-07 LAB — CBC
HCT: 37.5 % — ABNORMAL LOW (ref 39.0–52.0)
Hemoglobin: 12.5 g/dL — ABNORMAL LOW (ref 13.0–17.0)
MCH: 31.7 pg (ref 26.0–34.0)
MCHC: 33.3 g/dL (ref 30.0–36.0)
MCV: 95.2 fL (ref 78.0–100.0)
Platelets: 202 10*3/uL (ref 150–400)
RBC: 3.94 MIL/uL — ABNORMAL LOW (ref 4.22–5.81)
RDW: 14.7 % (ref 11.5–15.5)
WBC: 6.7 10*3/uL (ref 4.0–10.5)

## 2016-04-07 LAB — CULTURE, BLOOD (ROUTINE X 2)

## 2016-04-07 LAB — HEMOGLOBIN A1C
Hgb A1c MFr Bld: 6.3 % — ABNORMAL HIGH (ref 4.8–5.6)
Mean Plasma Glucose: 134 mg/dL

## 2016-04-07 MED ORDER — POTASSIUM CHLORIDE CRYS ER 20 MEQ PO TBCR
20.0000 meq | EXTENDED_RELEASE_TABLET | Freq: Every day | ORAL | Status: DC
  Administered 2016-04-07: 20 meq via ORAL

## 2016-04-07 MED ORDER — TORSEMIDE 20 MG PO TABS
20.0000 mg | ORAL_TABLET | Freq: Two times a day (BID) | ORAL | Status: DC
  Administered 2016-04-07 – 2016-04-08 (×2): 20 mg via ORAL
  Filled 2016-04-07 (×2): qty 1

## 2016-04-07 MED ORDER — ZOLPIDEM TARTRATE 5 MG PO TABS
5.0000 mg | ORAL_TABLET | Freq: Every evening | ORAL | Status: DC | PRN
  Administered 2016-04-07: 5 mg via ORAL
  Filled 2016-04-07: qty 1

## 2016-04-07 NOTE — Progress Notes (Signed)
PROGRESS NOTE    ROWLAND ERICSSON  ZOX:[id number] DOB: 09-04-1950 DOA: 04/04/2016 PCP: [name], MD  Podiatry-Dr. [name]   Brief Narrative:  [name] is an 65 y.o. male with hx of CAD, DM2, obesity, HLD, CKD3, CHF on diuretics, COPD with tobacco abuse, presented to the ER with sudden onset of subjective fever, rigors, and non productive coughs.  He had no dysuria, abdominal pain, nausea, vomiting, sore throat, HA, stiff neck or diarrhea. His wife of 6 weeks, an Therapist, sports, said he does get sick quite quickly.  Evaluation in the ER showed negative influenza, clear CXR, and UA with hematuria. His WBC was 17K, and his lactic acid was 0.71. His vital signs were stable. He was given IV Rocephin, and hospitalist was asked to admit him for sepsis.    Assessment & Plan:   Principal Problem:   Sepsis ([place]) Active Problems:   Streptococcal bacteremia   Hematuria   Diarrhea   Bilateral diabetic foot ulcer associated with secondary diabetes mellitus (HCC)   Cellulitis of leg, left   Generalized anxiety disorder   TOBACCO ABUSE   BUERGER'S DISEASE   Hypertension   CKD stage 3 due to type 2 diabetes mellitus (HCC)   Hypokalemia   Diabetes mellitus with nephropathy (HCC)   Hyponatremia    1. Sepsis, secondary to group G Streptococcus. On admission, blood cultures and a urine culture were ordered. He was started on broad-spectrum antibiotics with Levaquin, cefepime, and vancomycin. Influenza PCR was negative. One out of 2 blood cultures have become positive for Streptococcus species-Streptococcus group G. Potential etiology of the Streptococcus bacteremia include cellulitis and less likely infected plantar ulcers of the feet. C. difficile PCR was negative. -Antibiotic therapy changed to Rocephin 2 g daily and cefepime and Levaquin were discontinued. -We'll discontinue Flagyl as the C. difficile was negative. -Patient appears to be improving symptomatically. His white blood cell count has  normalized. He is no longer hypotensive.  Diarrhea. Patient reports small watery stools several times daily over the past few days. He had been treated with clindamycin in the outpatient setting for plantar wound infection by his podiatrist, Dr. [name]. IV Flagyl started empirically. C. difficile toxin was ordered and was negative. Flagyl was discontinued.  Plantar ulcers. On exam, these do not appear to be actively infected, but could have been a nidus for infection/cellulitis.. Wound care was consulted and her assessment and recommendations noted and appreciated.  Left lower extremity cellulitis. The patient had a biopsy of a cellulitic lesion recently. Per his account, the biopsy revealed some type of arthropod bite. Since that time, he has had redness of his left leg. -Patient was started on antibiotics as discussed above. Will continue Rocephin and vancomycin for now. -There is some improvement in his cellulitis. -Due to the edema, will order lower extremity venous Doppler to rule out DVT.  Microhematuria. Patient was noted to have hematuria on urinalysis, but no pyuria. Renal ultrasound was ordered and revealed no hydronephrosis, vascular calcification versus intrarenal calculus in the left kidney, and mild renal parenchymal thinning of the left kidney. Patient is asymptomatic. Recommend outpatient monitoring.  Hyponatremia. Patient was started on gentle IV fluids. His torsemide was withheld. With IV fluids, his serum sodium has improved and is within normal limits. Will decrease IV fluid rate. His TSH was within normal limits.  Stage III chronic kidney disease. The patient's creatinine is at baseline. He is treated with torsemide chronically. It was held on admission due to volume depletion and  early sepsis. It was restarted at half the dose, but will be increased to the home dose.  Hypokalemia. Patient's serum potassium was 2.9 on admission. Potassium chloride was started with some  improvement. Will continue potassium chloride supplementation. His magnesium level was within normal limits. The hypokalemia is likely from torsemide. -His serum potassium has improved.  Hypertension. Patient is chronically treated with amlodipine, ramipril, and carvedilol and hydralazine. Patient stated that labetalol and hydrochlorothiazide (listed on his admission medication list) were discontinued. All were held on admission due to hypotension. His blood pressure started trending up, so some of his medications were restarted. -Labetalol was discontinued and carvedilol was started per my discussion about his outpatient regimen. Norvasc was restarted. Ramipri was restarted a lower dose. We'll continue hydralazine. -His blood pressure has been stable.   Diabetes mellitus with nephropathy and neuropathy. Patient is treated chronically with Trulicity, Invokana and Lantus. Patient was started on sliding scale NovoLog and Lantus. Will adjust insulin for better glycemic control.  -His CBGs have improved. Hemoglobin A1c is pending.  Tobacco abuse. Patient was instructed to stop smoking. Nicotine patch was ordered.  DVT prophylaxis: Subcutaneous heparin Code Status: Full code Family Communication: Discussed with his wife Disposition Plan: Discharged home in clinically appropriate   Consultants:   None  Procedures:   None  Antimicrobials:   Rocephin 11/17>>  Vancomycin 11/16>>  Flagyl 11/17>> 11/18  Cefepime 11/16-11/17  Levaquin 11/16-11/17   Subjective: He still has some swelling in his left leg, but some of the redness is subsiding. He denies nausea, vomiting, or diarrhea.  Objective: Vitals:   04/06/16 0505 04/06/16 1440 04/06/16 2133 04/07/16 0559  BP: (!) 152/62 (!) 157/73 135/76 (!) 132/56  Pulse: 75 70 60 60  Resp: 20 19 19 18   Temp: 98.7 F (37.1 C) 98.2 F (36.8 C) 98.5 F (36.9 C) 98.4 F (36.9 C)  TempSrc: Oral Oral Oral Oral  SpO2: 96% 97% 93% 94%    Weight:    119.5 kg (263 lb 8 oz)  Height:        Intake/Output Summary (Last 24 hours) at 04/07/16 1227 Last data filed at 04/06/16 2134  Gross per 24 hour  Intake              780 ml  Output                1 ml  Net              779 ml   Filed Weights   04/04/16 2022 04/05/16 0538 04/07/16 0559  Weight: 110.7 kg (244 lb 1.6 oz) 117.4 kg (258 lb 14.4 oz) 119.5 kg (263 lb 8 oz)    Examination:  General exam: Appears calm, but ill and uncomfortable.  Respiratory system: Clear to auscultation. Respiratory effort normal. Cardiovascular system: S1 & S2 heard, RRR. No JVD, murmurs, rubs, gallops or clicks. 1+ left lower extremity edema. Gastrointestinal system: Abdomen is nondistended, soft and nontender. No organomegaly or masses felt. Normal bowel sounds heard. Central nervous system: Alert and oriented. No focal neurological deficits. Extremities: Symmetric 5 x 5 power. Skin: Left lower extremity-Mild diffuse erythema below the left knee extending to the ankle; healing bilateral plantar ulcers without malodorous drainage; both surrounded by callus. Psychiatry: Judgement and insight appear normal. Mood & affect appropriate.     Data Reviewed: I have personally reviewed following labs and imaging studies  CBC:  Recent Labs Lab 04/04/16 1253 04/05/16 0624 04/06/16 0637 04/07/16 0618  WBC 17.4*  10.6* 7.8 6.7  NEUTROABS 14.9*  --   --   --   HGB 14.3 13.0 12.2* 12.5*  HCT 41.3 38.6* 36.1* 37.5*  MCV 94.3 94.6 95.0 95.2  PLT 223 198 180 176   Basic Metabolic Panel:  Recent Labs Lab 04/04/16 1253 04/05/16 0624 04/06/16 0637 04/07/16 0618  NA 131* 132* 135 138  K 2.9* 3.3* 3.2* 3.8  CL 94* 99* 105 108  CO2 27 25 25 23   GLUCOSE 115* 138* 94 83  BUN 20 21* 20 20  CREATININE 2.59* 2.33* 2.19* 2.17*  CALCIUM 8.4* 7.9* 8.1* 8.4*  MG  --   --  2.0  --    GFR: Estimated Creatinine Clearance: 49.2 mL/min (by C-G formula based on SCr of 2.17 mg/dL (H)). Liver Function  Tests:  Recent Labs Lab 04/04/16 1253 04/05/16 0624  AST 19 53*  ALT 13* 36  ALKPHOS 52 69  BILITOT 1.0 0.9  PROT 7.7 6.8  ALBUMIN 3.1* 2.7*   No results for input(s): LIPASE, AMYLASE in the last 168 hours. No results for input(s): AMMONIA in the last 168 hours. Coagulation Profile: No results for input(s): INR, PROTIME in the last 168 hours. Cardiac Enzymes: No results for input(s): CKTOTAL, CKMB, CKMBINDEX, TROPONINI in the last 168 hours. BNP (last 3 results) No results for input(s): PROBNP in the last 8760 hours. HbA1C: No results for input(s): HGBA1C in the last 72 hours. CBG:  Recent Labs Lab 04/06/16 0807 04/06/16 1150 04/06/16 1644 04/07/16 0853 04/07/16 1216  GLUCAP 102* 135* 121* 130* 143*   Lipid Profile: No results for input(s): CHOL, HDL, LDLCALC, TRIG, CHOLHDL, LDLDIRECT in the last 72 hours. Thyroid Function Tests:  Recent Labs  04/06/16 0637  TSH 0.539   Anemia Panel: No results for input(s): VITAMINB12, FOLATE, FERRITIN, TIBC, IRON, RETICCTPCT in the last 72 hours. Sepsis Labs:  Recent Labs Lab 04/04/16 1258 04/04/16 1605  LATICACIDVEN 1.89 0.71    Recent Results (from the past 240 hour(s))  Blood culture (routine x 2)     Status: None (Preliminary result)   Collection Time: 04/04/16  1:13 PM  Result Value Ref Range Status   Specimen Description BLOOD RIGHT ANTECUBITAL  Final   Special Requests   Final    BOTTLES DRAWN AEROBIC AND ANAEROBIC AEB=10CC ANA=7CC   Culture NO GROWTH 3 DAYS  Final   Report Status PENDING  Incomplete  Blood culture (routine x 2)     Status: Abnormal   Collection Time: 04/04/16  1:21 PM  Result Value Ref Range Status   Specimen Description BLOOD LEFT HAND  Final   Special Requests BOTTLES DRAWN AEROBIC AND ANAEROBIC 6CC  Final   Culture  Setup Time   Final    GRAM POSITIVE COCCI in chains and pairs recovered from the anaerobic bottle Gram Stain Report Called to,Read Back By and Verified With: Hurteau,L at  710am by H Flynt 04/05/16 Performed at [facility], READ BACK BY AND VERIFIED WITH: L PITTMAN 04/05/16 @ 1053 M VESTAL Performed at [facility] (A)  Final   Report Status 04/07/2016 FINAL  Final   Organism ID, Bacteria STREPTOCOCCUS GROUP G  Final      Susceptibility   Streptococcus group g - MIC*    CLINDAMYCIN >=1 RESISTANT Resistant     AMPICILLIN <=0.25 SENSITIVE Sensitive     ERYTHROMYCIN >=8 RESISTANT Resistant     VANCOMYCIN 0.5 SENSITIVE Sensitive  CEFTRIAXONE <=0.12 SENSITIVE Sensitive     LEVOFLOXACIN 1 SENSITIVE Sensitive     * STREPTOCOCCUS GROUP G  Blood Culture ID Panel (Reflexed)     Status: Abnormal   Collection Time: 04/04/16  1:21 PM  Result Value Ref Range Status   Enterococcus species NOT DETECTED NOT DETECTED Final   Listeria monocytogenes NOT DETECTED NOT DETECTED Final   Staphylococcus species NOT DETECTED NOT DETECTED Final   Staphylococcus aureus NOT DETECTED NOT DETECTED Final   Streptococcus species DETECTED (A) NOT DETECTED Final    Comment: CRITICAL RESULT CALLED TO, READ BACK BY AND VERIFIED WITH: L PITTMAN 04/05/16 @ 1053 M VESTAL    Streptococcus agalactiae NOT DETECTED NOT DETECTED Final   Streptococcus pneumoniae NOT DETECTED NOT DETECTED Final   Streptococcus pyogenes NOT DETECTED NOT DETECTED Final   Acinetobacter baumannii NOT DETECTED NOT DETECTED Final   Enterobacteriaceae species NOT DETECTED NOT DETECTED Final   Enterobacter cloacae complex NOT DETECTED NOT DETECTED Final   Escherichia coli NOT DETECTED NOT DETECTED Final   Klebsiella oxytoca NOT DETECTED NOT DETECTED Final   Klebsiella pneumoniae NOT DETECTED NOT DETECTED Final   Proteus species NOT DETECTED NOT DETECTED Final   Serratia marcescens NOT DETECTED NOT DETECTED Final   Haemophilus influenzae NOT DETECTED NOT DETECTED Final   Neisseria meningitidis NOT DETECTED NOT DETECTED Final   Pseudomonas  aeruginosa NOT DETECTED NOT DETECTED Final   Candida albicans NOT DETECTED NOT DETECTED Final   Candida glabrata NOT DETECTED NOT DETECTED Final   Candida krusei NOT DETECTED NOT DETECTED Final   Candida parapsilosis NOT DETECTED NOT DETECTED Final   Candida tropicalis NOT DETECTED NOT DETECTED Final    Comment: Performed at [facility]  Urine culture     Status: None   Collection Time: 04/04/16  2:40 PM  Result Value Ref Range Status   Specimen Description URINE, CLEAN CATCH  Final   Special Requests NONE  Final   Culture NO GROWTH Performed at [facility]   Final   Report Status 04/06/2016 FINAL  Final  C difficile quick scan w PCR reflex     Status: None   Collection Time: 04/05/16  3:40 PM  Result Value Ref Range Status   C Diff antigen NEGATIVE NEGATIVE Final   C Diff toxin NEGATIVE NEGATIVE Final   C Diff interpretation No C. difficile detected.  Final         Radiology Studies: US Renal  Result Date: 04/05/2016 CLINICAL DATA:  Hematuria. EXAM: RENAL / URINARY TRACT ULTRASOUND COMPLETE COMPARISON:  09/13/2015 FINDINGS: Right Kidney: Length: 13.0 cm. Echogenicity within normal limits. No mass or hydronephrosis visualized. Left Kidney: Length: 13.8 cm. Echogenicity is normal. Suspect mild renal parenchymal thinning. In the mid aspect of the left renal pelvis and may represent a vascular calcification or intrarenal stone. Bladder: Appears normal for degree of bladder distention. IMPRESSION: 1. No hydronephrosis. 2. Vascular calcification versus intrarenal calculus in the mid pole region of the left kidney. 3. Mild renal parenchymal thinning of the left kidney. Electronically Signed   By: Nolon Nations M.D.   On: 04/05/2016 15:42        Scheduled Meds: . amLODipine  10 mg Oral Daily  . aspirin  81 mg Oral Daily  . carvedilol  25 mg Oral BID WC  . cefTRIAXone (ROCEPHIN)  IV  2 g Intravenous Q24H  . heparin  5,000 Units Subcutaneous Q8H  . hydrALAZINE   100 mg Oral BID  . insulin  aspart  0-15 Units Subcutaneous TID WC  . insulin aspart  0-5 Units Subcutaneous QHS  . insulin glargine  10 Units Subcutaneous QHS  . nicotine  21 mg Transdermal Daily  . pantoprazole  40 mg Oral Daily  . ramipril  5 mg Oral Daily  . rosuvastatin  20 mg Oral q1800  . sodium chloride flush  3 mL Intravenous Q12H  . torsemide  10 mg Oral BID  . vancomycin  1,250 mg Intravenous Q24H   Continuous Infusions:    LOS: 2 days    Time spent: 35 minutes    Rexene Alberts, MD [facility] Pager [phone]   If 7PM-7AM, please contact night-coverage [email or web address] Password [facility] 04/07/2016, 12:27 PM

## 2016-04-08 DIAGNOSIS — A408 Other streptococcal sepsis: Secondary | ICD-10-CM

## 2016-04-08 LAB — BASIC METABOLIC PANEL
Anion gap: 7 (ref 5–15)
BUN: 23 mg/dL — ABNORMAL HIGH (ref 6–20)
CO2: 27 mmol/L (ref 22–32)
Calcium: 8.5 mg/dL — ABNORMAL LOW (ref 8.9–10.3)
Chloride: 104 mmol/L (ref 101–111)
Creatinine, Ser: 2.11 mg/dL — ABNORMAL HIGH (ref 0.61–1.24)
GFR calc Af Amer: 37 mL/min — ABNORMAL LOW (ref >60)
GFR calc non Af Amer: 32 mL/min — ABNORMAL LOW (ref >60)
Glucose, Bld: 139 mg/dL — ABNORMAL HIGH (ref 65–99)
Potassium: 3.1 mmol/L — ABNORMAL LOW (ref 3.5–5.1)
Sodium: 138 mmol/L (ref 135–145)

## 2016-04-08 LAB — CBC
HCT: 38.4 % — ABNORMAL LOW (ref 39.0–52.0)
Hemoglobin: 13 g/dL (ref 13.0–17.0)
MCH: 31.9 pg (ref 26.0–34.0)
MCHC: 33.9 g/dL (ref 30.0–36.0)
MCV: 94.3 fL (ref 78.0–100.0)
Platelets: 211 10*3/uL (ref 150–400)
RBC: 4.07 MIL/uL — ABNORMAL LOW (ref 4.22–5.81)
RDW: 14.5 % (ref 11.5–15.5)
WBC: 6.5 10*3/uL (ref 4.0–10.5)

## 2016-04-08 LAB — GLUCOSE, CAPILLARY
Glucose-Capillary: 119 mg/dL — ABNORMAL HIGH (ref 65–99)
Glucose-Capillary: 152 mg/dL — ABNORMAL HIGH (ref 65–99)

## 2016-04-08 MED ORDER — HYDROCODONE-ACETAMINOPHEN 5-325 MG PO TABS: 1.0000 | ORAL_TABLET | Freq: Four times a day (QID) | ORAL | 0 refills | Status: DC | PRN

## 2016-04-08 MED ORDER — POTASSIUM CHLORIDE CRYS ER 20 MEQ PO TBCR: 20.0000 meq | EXTENDED_RELEASE_TABLET | Freq: Every day | ORAL | 0 refills | Status: DC

## 2016-04-08 MED ORDER — CEFUROXIME AXETIL 500 MG PO TABS: 500.0000 mg | ORAL_TABLET | Freq: Two times a day (BID) | ORAL | 0 refills | Status: DC

## 2016-04-08 MED ORDER — POTASSIUM CHLORIDE CRYS ER 20 MEQ PO TBCR
40.0000 meq | EXTENDED_RELEASE_TABLET | Freq: Every day | ORAL | Status: DC
  Administered 2016-04-08: 40 meq via ORAL
  Filled 2016-04-08: qty 2

## 2016-04-08 NOTE — Care Management Important Message (Signed)
Important Message  Patient Details  Name: [name] MRN: [id number] Date of Birth: 02-28-1951   Medicare Important Message Given:  Yes    Mackinsey Pelland, Chauncey Reading, RN 04/08/2016, 2:20 PM

## 2016-04-08 NOTE — Discharge Summary (Signed)
Physician Discharge Summary  [name]:[id number] DOB: [birth year] DOA: 04/04/2016  PCP: [name], MD  Admit date: 04/04/2016 Discharge date: 04/08/2016  Time spent: Greater than 30 minutes  Recommendations for Outpatient Follow-up:  1. Patient has appointments to follow-up with his nephrologist, PCP, and podiatrist in the upcoming week or 2.   Discharge Diagnoses:  1. Sepsis secondary to group G Streptococcus bacteremia. 2. Left lower extremity cellulitis. 3. Short-lived diarrhea. 4. Chronic bilateral diabetic plantar ulcers. 5. Asymptomatic microhematuria. 6. Hyponatremia. 7. Stage III chronic kidney disease. 8. Hypokalemia. 9. Essential hypertension. 10. Diabetes with nephropathy and neuropathy. 11. Tobacco abuse. 12. Peripheral vascular disease.   Discharge Condition: Improved.  Diet recommendation: Heart healthy/carbohydrate modified.  Filed Weights   04/04/16 2022 04/05/16 0538 04/07/16 0559  Weight: 110.7 kg (244 lb 1.6 oz) 117.4 kg (258 lb 14.4 oz) 119.5 kg (263 lb 8 oz)    History of present illness:  Albert C Barkeris an 65 y.o.malewith hx of CAD, DM2, obesity, HLD, CKD3, CHF on diuretics, COPD with tobacco abuse, presented to the ER with sudden onset of subjective fever, rigors, and non productive coughs. He had no dysuria, abdominal pain, nausea, vomiting, sore throat, HA, stiff neck or diarrhea. His wife of 6 weeks, an Therapist, sports, said he does get sick quite quickly. Evaluation in the ER revealed negative influenza PCR, clear CXR, and UA with hematuria. His WBC was 17K, and his lactic acid was 0.71. His vital signs were stable. He was given IV Rocephin, and the hospitalist was asked to admit him for sepsis.   Hospital Course:   1. Sepsis, secondary to group G Streptococcus. On admission, blood cultures and a urine culture were ordered. He was started on broad-spectrum antibiotics with Levaquin, cefepime, and vancomycin. Influenza PCR was negative.  One out of 2 blood cultures have become positive for Streptococcus species-Streptococcus group G. Potential etiology of the Streptococcus bacteremia included cellulitis and less likely infected plantar ulcers of the feet. C. difficile PCR was negative. -Antibiotic therapy changed to Rocephin 2 g daily and cefepime and Levaquin were discontinued. -Patient improved clinically and symptomatically. His white blood cell count normalized. He remained afebrile. -She was discharged on 7 more days of Ceftin.  Diarrhea. Patient reported small watery stools several times daily prior to hospital admission. He had been treated with clindamycin in the outpatient setting for plantar wound infection by his podiatrist, Dr. [name]. IV Flagyl was started empirically. C. difficile toxin was ordered and was negative. Flagyl was discontinued. -His diarrhea resolved.  Plantar ulcers. On exam, the ulcers did not appear to be actively infected, but could have been a nidus for infection/cellulitis.. Wound care was consulted and her assessment and recommendations noted and appreciated. Dressings were ordered and applied. Patient was instructed to follow-up with his podiatrist, Dr. [name] as scheduled.  Left lower extremity cellulitis. The patient had a biopsy of a cellulitic lesion recently. Per his account, the biopsy revealed some type of arthropod bite. Since that time, he has had redness of his left leg. -Patient was started on antibiotics as discussed above. Vancomycin was continued for cellulitis. -Due to the edema, lower extremity venous Doppler was ordered to rule out DVT. It was negative. -The cellulitis was resolving at the time of discharge.  Microhematuria. Patient was noted to have hematuria on urinalysis, but no pyuria. Renal ultrasound was ordered and revealed no hydronephrosis, vascular calcification versus intrarenal calculus in the left kidney, and mild renal parenchymal thinning of the left  kidney.  Patient was asymptomatic. Recommend outpatient monitoring.  Hyponatremia. Patient was started on gentle IV fluids. His torsemide was withheld. With IV fluids, his serum sodium improved and normalized. His TSH was within normal limits.  Stage III chronic kidney disease. The patient's creatinine was at baseline. He is treated with torsemide chronically. It was held on admission due to volume depletion and early sepsis. It was eventually restarted as his blood pressure increased.  Hypokalemia. Patient's serum potassium was 2.9 on admission. Potassium chloride was started with some improvement. His magnesium level was within normal limits. The hypokalemia was likely from torsemide and being off of  -His serum potassium has improved.  Hypertension. Patient is chronically treated with amlodipine, ramipril, and carvedilol and hydralazine. Patient stated that labetalol was discontinued. All were held on admission due to hypotension. His blood pressure started trending up, so his antihypertensive medications were continued and eventually titrated up to the prehospital doses. -His blood pressure stabilized.  Diabetes mellitus with nephropathy and neuropathy. Patient is treated chronically with Trulicity, Invokana and Lantus. Patient was started on sliding scale NovoLog and Lantus. His CBGs improved. His hemoglobin A1c was 6.3. He is instructed to restart his home regimen at discharge.  Tobacco abuse. Patient was instructed to stop smoking. Nicotine patch was ordered.   Procedures:  None  Consultations:  Wound care  Discharge Exam: Vitals:   04/07/16 2148 04/08/16 0641  BP: (!) 168/73 (!) 154/74  Pulse: 67 65  Resp: 16 20  Temp: 98.1 F (36.7 C) 97.8 F (36.6 C)    General: 65 year old man in no acute distress. Cardiovascular: S1, S2, no murmurs rubs or gallops. Trace of left lower extremity pedal edema. Respiratory: Clear to auscultation bilaterally. Skin: Left lower  extremity with significantly less erythema which extends below the knee to the ankle; well-healed plantar ulcers on both feet; no drainage.  Discharge Instructions   Discharge Instructions    Diet - low sodium heart healthy    Complete by:  As directed    Diet Carb Modified    Complete by:  As directed    Discharge wound care:    Complete by:  As directed    Continue dressing changes as before.   Increase activity slowly    Complete by:  As directed      Current Discharge Medication List    START taking these medications   Details  cefUROXime (CEFTIN) 500 MG tablet Take 1 tablet (500 mg total) by mouth 2 (two) times daily with a meal. Start taking antibiotic on 04/09/2016. Qty: 14 tablet, Refills: 0    HYDROcodone-acetaminophen (NORCO/VICODIN) 5-325 MG tablet Take 1 tablet by mouth every 6 (six) hours as needed for moderate pain. Qty: 15 tablet, Refills: 0    potassium chloride SA (K-DUR,KLOR-CON) 20 MEQ tablet Take 1 tablet (20 mEq total) by mouth daily. Qty: 7 tablet, Refills: 0      CONTINUE these medications which have NOT CHANGED   Details  albuterol (VENTOLIN HFA) 108 (90 BASE) MCG/ACT inhaler Inhale 2 puffs into the lungs every 6 (six) hours as needed for wheezing or shortness of breath.     amLODipine (NORVASC) 10 MG tablet Take 1 tablet (10 mg total) by mouth daily. Qty: 90 tablet, Refills: 3    aspirin 81 MG tablet Take 81 mg by mouth daily. On hold since 08/08/15    canagliflozin (INVOKANA) 100 MG TABS tablet Take 100 mg by mouth daily before breakfast.    carvedilol (COREG) 25 MG tablet Take  37.5 mg by mouth 2 (two) times daily with a meal.    cholecalciferol (VITAMIN D) 1000 UNITS tablet Take 1,000 Units by mouth daily.    hydrALAZINE (APRESOLINE) 100 MG tablet Take 100 mg by mouth 3 (three) times daily.    hydrochlorothiazide (HYDRODIURIL) 25 MG tablet Take 25 mg by mouth daily.    insulin glargine (LANTUS) 100 UNIT/ML injection Inject 20 Units into the  skin every morning.     LINZESS 290 MCG CAPS capsule Take 290 mcg by mouth daily as needed (for constipation).  Refills: 6    Multiple Vitamin (MULTIVITAMIN) tablet Take 1 tablet by mouth daily.      nicotine (NICODERM CQ - DOSED IN MG/24 HOURS) 21 mg/24hr patch Place 21 mg onto the skin daily.    ramipril (ALTACE) 10 MG tablet Take 10 mg by mouth at bedtime.     rosuvastatin (CRESTOR) 20 MG tablet TAKE ONE TABLET BY MOUTH DAILY Qty: 30 tablet, Refills: 6    sildenafil (REVATIO) 20 MG tablet Take 2 tablets by mouth daily as needed (erectile disfunction).  Refills: 5    torsemide (DEMADEX) 20 MG tablet Take 20 mg by mouth 2 (two) times daily.    TRULICITY 1.5 GX/2.1JH SOPN INJECT 1.5 MG SUBCUTANEOUSLY ONCE every Wednesday Refills: 11      STOP taking these medications     clindamycin (CLEOCIN) 300 MG capsule      omeprazole (PRILOSEC) 20 MG capsule      triamcinolone cream (KENALOG) 0.1 %        No Known Allergies Follow-up Information    [name], MD Follow up today.   Specialty:  Family Medicine Why:  Follow-up as scheduled. Contact information: [address] [place] [address] [phone]        Marcheta Grammes, DPM Follow up today.   Specialty:  Podiatry Why:  Follow-up as scheduled. Contact information: [place] [address] [phone]        [facility] S, MD Follow up today.   Specialty:  Nephrology Why:  Follow-up as scheduled. Contact information: [address] [place] [address] [phone]            The results of significant diagnostics from this hospitalization (including imaging, microbiology, ancillary and laboratory) are listed below for reference.    Significant Diagnostic Studies: Dg Chest 2 View  Result Date: 04/04/2016 CLINICAL DATA:  Fever starting last night. Cough and congestion. COPD. EXAM: CHEST  2 VIEW COMPARISON:  07/31/2015 FINDINGS: Atherosclerotic calcification of  the aortic arch. Heart size within normal limits. Old right posterolateral rib deformities compatible with old healed fractures. Large lung volumes. No pleural effusion. Vague linear density in the left lung apex, not readily apparent previously, I favor this representing subsegmental atelectasis and given the thickness of the finding I am skeptical that this is a pleural reflection edge related to a pneumothorax. IMPRESSION: 1. Faint linear opacity at the left lung apex probably from subsegmental atelectasis. 2. Atherosclerotic aortic arch. 3. Large lung volumes, possibly from emphysema. Electronically Signed   By: Van Clines M.D.   On: 04/04/2016 13:14   US Renal  Result Date: 04/05/2016 CLINICAL DATA:  Hematuria. EXAM: RENAL / URINARY TRACT ULTRASOUND COMPLETE COMPARISON:  09/13/2015 FINDINGS: Right Kidney: Length: 13.0 cm. Echogenicity within normal limits. No mass or hydronephrosis visualized. Left Kidney: Length: 13.8 cm. Echogenicity is normal. Suspect mild renal parenchymal thinning. In the mid aspect of the left renal pelvis and may represent  a vascular calcification or intrarenal stone. Bladder: Appears normal for degree of bladder distention. IMPRESSION: 1. No hydronephrosis. 2. Vascular calcification versus intrarenal calculus in the mid pole region of the left kidney. 3. Mild renal parenchymal thinning of the left kidney. Electronically Signed   By: Nolon Nations M.D.   On: 04/05/2016 15:42   US Venous Img Lower Unilateral Left  Result Date: 04/07/2016 CLINICAL DATA:  65 year old male with a history of swelling EXAM: LEFT LOWER EXTREMITY VENOUS DOPPLER ULTRASOUND TECHNIQUE: Gray-scale sonography with graded compression, as well as color Doppler and duplex ultrasound were performed to evaluate the lower extremity deep venous systems from the level of the common femoral vein and including the common femoral, femoral, profunda femoral, popliteal and calf veins including the posterior  tibial, peroneal and gastrocnemius veins when visible. The superficial great saphenous vein was also interrogated. Spectral Doppler was utilized to evaluate flow at rest and with distal augmentation maneuvers in the common femoral, femoral and popliteal veins. COMPARISON:  None. FINDINGS: Contralateral Common Femoral Vein: Respiratory phasicity is normal and symmetric with the symptomatic side. No evidence of thrombus. Normal compressibility. Common Femoral Vein: No evidence of thrombus. Normal compressibility, respiratory phasicity and response to augmentation. Saphenofemoral Junction: No evidence of thrombus. Normal compressibility and flow on color Doppler imaging. Profunda Femoral Vein: No evidence of thrombus. Normal compressibility and flow on color Doppler imaging. Femoral Vein: No evidence of thrombus. Normal compressibility, respiratory phasicity and response to augmentation. Popliteal Vein: No evidence of thrombus. Normal compressibility, respiratory phasicity and response to augmentation. Calf Veins: No evidence of thrombus. Normal compressibility and flow on color Doppler imaging. Superficial Great Saphenous Vein: No evidence of thrombus. Normal compressibility and flow on color Doppler imaging. Other Findings:  Edema of the left lower extremity IMPRESSION: Sonographic survey of the left lower extremity negative for DVT. Edema of the left lower extremity. Signed, Dulcy Fanny. Earleen Newport, DO [facility] Electronically Signed   By: Corrie Mckusick D.O.   On: 04/07/2016 13:48    Microbiology: Recent Results (from the past 240 hour(s))  Blood culture (routine x 2)     Status: None (Preliminary result)   Collection Time: 04/04/16  1:13 PM  Result Value Ref Range Status   Specimen Description BLOOD RIGHT ANTECUBITAL  Final   Special Requests   Final    BOTTLES DRAWN AEROBIC AND ANAEROBIC AEB=10CC ANA=7CC   Culture NO GROWTH 4 DAYS  Final   Report  Status PENDING  Incomplete  Blood culture (routine x 2)     Status: Abnormal   Collection Time: 04/04/16  1:21 PM  Result Value Ref Range Status   Specimen Description BLOOD LEFT HAND  Final   Special Requests BOTTLES DRAWN AEROBIC AND ANAEROBIC 6CC  Final   Culture  Setup Time   Final    GRAM POSITIVE COCCI in chains and pairs recovered from the anaerobic bottle Gram Stain Report Called to,Read Back By and Verified With: Hurteau,L at 710am by H Flynt 04/05/16 Performed at [facility], READ BACK BY AND VERIFIED WITH: L PITTMAN 04/05/16 @ 1053 M VESTAL Performed at [facility] (A)  Final   Report Status 04/07/2016 FINAL  Final   Organism ID, Bacteria STREPTOCOCCUS GROUP G  Final      Susceptibility   Streptococcus group g - MIC*    CLINDAMYCIN >=1 RESISTANT Resistant     AMPICILLIN <=0.25 SENSITIVE  Sensitive     ERYTHROMYCIN >=8 RESISTANT Resistant     VANCOMYCIN 0.5 SENSITIVE Sensitive     CEFTRIAXONE <=0.12 SENSITIVE Sensitive     LEVOFLOXACIN 1 SENSITIVE Sensitive     * STREPTOCOCCUS GROUP G  Blood Culture ID Panel (Reflexed)     Status: Abnormal   Collection Time: 04/04/16  1:21 PM  Result Value Ref Range Status   Enterococcus species NOT DETECTED NOT DETECTED Final   Listeria monocytogenes NOT DETECTED NOT DETECTED Final   Staphylococcus species NOT DETECTED NOT DETECTED Final   Staphylococcus aureus NOT DETECTED NOT DETECTED Final   Streptococcus species DETECTED (A) NOT DETECTED Final    Comment: CRITICAL RESULT CALLED TO, READ BACK BY AND VERIFIED WITH: L PITTMAN 04/05/16 @ 1053 M VESTAL    Streptococcus agalactiae NOT DETECTED NOT DETECTED Final   Streptococcus pneumoniae NOT DETECTED NOT DETECTED Final   Streptococcus pyogenes NOT DETECTED NOT DETECTED Final   Acinetobacter baumannii NOT DETECTED NOT DETECTED Final   Enterobacteriaceae species NOT DETECTED NOT DETECTED Final   Enterobacter  cloacae complex NOT DETECTED NOT DETECTED Final   Escherichia coli NOT DETECTED NOT DETECTED Final   Klebsiella oxytoca NOT DETECTED NOT DETECTED Final   Klebsiella pneumoniae NOT DETECTED NOT DETECTED Final   Proteus species NOT DETECTED NOT DETECTED Final   Serratia marcescens NOT DETECTED NOT DETECTED Final   Haemophilus influenzae NOT DETECTED NOT DETECTED Final   Neisseria meningitidis NOT DETECTED NOT DETECTED Final   Pseudomonas aeruginosa NOT DETECTED NOT DETECTED Final   Candida albicans NOT DETECTED NOT DETECTED Final   Candida glabrata NOT DETECTED NOT DETECTED Final   Candida krusei NOT DETECTED NOT DETECTED Final   Candida parapsilosis NOT DETECTED NOT DETECTED Final   Candida tropicalis NOT DETECTED NOT DETECTED Final    Comment: Performed at [facility]  Urine culture     Status: None   Collection Time: 04/04/16  2:40 PM  Result Value Ref Range Status   Specimen Description URINE, CLEAN CATCH  Final   Special Requests NONE  Final   Culture NO GROWTH Performed at [facility]   Final   Report Status 04/06/2016 FINAL  Final  C difficile quick scan w PCR reflex     Status: None   Collection Time: 04/05/16  3:40 PM  Result Value Ref Range Status   C Diff antigen NEGATIVE NEGATIVE Final   C Diff toxin NEGATIVE NEGATIVE Final   C Diff interpretation No C. difficile detected.  Final     Labs: Basic Metabolic Panel:  Recent Labs Lab 04/04/16 1253 04/05/16 0624 04/06/16 0637 04/07/16 0618 04/08/16 0614  NA 131* 132* 135 138 138  K 2.9* 3.3* 3.2* 3.8 3.1*  CL 94* 99* 105 108 104  CO2 27 25 25 23 27   GLUCOSE 115* 138* 94 83 139*  BUN 20 21* 20 20 23*  CREATININE 2.59* 2.33* 2.19* 2.17* 2.11*  CALCIUM 8.4* 7.9* 8.1* 8.4* 8.5*  MG  --   --  2.0  --   --    Liver Function Tests:  Recent Labs Lab 04/04/16 1253 04/05/16 0624  AST 19 53*  ALT 13* 36  ALKPHOS 52 69  BILITOT 1.0 0.9  PROT 7.7 6.8  ALBUMIN 3.1* 2.7*   No results for input(s):  LIPASE, AMYLASE in the last 168 hours. No results for input(s): AMMONIA in the last 168 hours. CBC:  Recent Labs Lab 04/04/16 1253 04/05/16 0347 04/06/16 4259 04/07/16 0618 04/08/16 5638  WBC 17.4* 10.6* 7.8 6.7 6.5  NEUTROABS 14.9*  --   --   --   --   HGB 14.3 13.0 12.2* 12.5* 13.0  HCT 41.3 38.6* 36.1* 37.5* 38.4*  MCV 94.3 94.6 95.0 95.2 94.3  PLT 223 198 180 202 211   Cardiac Enzymes: No results for input(s): CKTOTAL, CKMB, CKMBINDEX, TROPONINI in the last 168 hours. BNP: BNP (last 3 results) No results for input(s): BNP in the last 8760 hours.  ProBNP (last 3 results) No results for input(s): PROBNP in the last 8760 hours.  CBG:  Recent Labs Lab 04/07/16 1216 04/07/16 1611 04/07/16 2150 04/08/16 0730 04/08/16 1120  GLUCAP 143* 156* 125* 119* 152*       Signed:  Latreshia Beauchaine MD.  [facility] 04/08/2016, 12:08 PM

## 2016-04-09 LAB — CULTURE, BLOOD (ROUTINE X 2): Culture: NO GROWTH

## 2016-04-17 DIAGNOSIS — I10 Essential (primary) hypertension: Secondary | ICD-10-CM | POA: Diagnosis not present

## 2016-04-17 DIAGNOSIS — E876 Hypokalemia: Secondary | ICD-10-CM | POA: Diagnosis not present

## 2016-04-17 DIAGNOSIS — N183 Chronic kidney disease, stage 3 (moderate): Secondary | ICD-10-CM | POA: Diagnosis not present

## 2016-04-17 DIAGNOSIS — R809 Proteinuria, unspecified: Secondary | ICD-10-CM | POA: Diagnosis not present

## 2016-04-18 DIAGNOSIS — E559 Vitamin D deficiency, unspecified: Secondary | ICD-10-CM | POA: Diagnosis not present

## 2016-04-18 DIAGNOSIS — E1142 Type 2 diabetes mellitus with diabetic polyneuropathy: Secondary | ICD-10-CM | POA: Diagnosis not present

## 2016-04-18 DIAGNOSIS — N189 Chronic kidney disease, unspecified: Secondary | ICD-10-CM | POA: Diagnosis not present

## 2016-04-18 DIAGNOSIS — D519 Vitamin B12 deficiency anemia, unspecified: Secondary | ICD-10-CM | POA: Diagnosis not present

## 2016-04-18 DIAGNOSIS — I10 Essential (primary) hypertension: Secondary | ICD-10-CM | POA: Diagnosis not present

## 2016-04-18 DIAGNOSIS — E1165 Type 2 diabetes mellitus with hyperglycemia: Secondary | ICD-10-CM | POA: Diagnosis not present

## 2016-04-18 DIAGNOSIS — E039 Hypothyroidism, unspecified: Secondary | ICD-10-CM | POA: Diagnosis not present

## 2016-04-18 DIAGNOSIS — K219 Gastro-esophageal reflux disease without esophagitis: Secondary | ICD-10-CM | POA: Diagnosis not present

## 2016-04-18 DIAGNOSIS — E7800 Pure hypercholesterolemia, unspecified: Secondary | ICD-10-CM | POA: Diagnosis not present

## 2016-04-18 DIAGNOSIS — L97522 Non-pressure chronic ulcer of other part of left foot with fat layer exposed: Secondary | ICD-10-CM | POA: Diagnosis not present

## 2016-04-18 DIAGNOSIS — L97512 Non-pressure chronic ulcer of other part of right foot with fat layer exposed: Secondary | ICD-10-CM | POA: Diagnosis not present

## 2016-04-22 DIAGNOSIS — Z720 Tobacco use: Secondary | ICD-10-CM | POA: Diagnosis not present

## 2016-04-22 DIAGNOSIS — Z6833 Body mass index (BMI) 33.0-33.9, adult: Secondary | ICD-10-CM | POA: Diagnosis not present

## 2016-04-22 DIAGNOSIS — E1165 Type 2 diabetes mellitus with hyperglycemia: Secondary | ICD-10-CM | POA: Diagnosis not present

## 2016-04-22 DIAGNOSIS — J438 Other emphysema: Secondary | ICD-10-CM | POA: Diagnosis not present

## 2016-04-22 DIAGNOSIS — E1122 Type 2 diabetes mellitus with diabetic chronic kidney disease: Secondary | ICD-10-CM | POA: Diagnosis not present

## 2016-04-22 DIAGNOSIS — E039 Hypothyroidism, unspecified: Secondary | ICD-10-CM | POA: Diagnosis not present

## 2016-04-25 DIAGNOSIS — E1142 Type 2 diabetes mellitus with diabetic polyneuropathy: Secondary | ICD-10-CM | POA: Diagnosis not present

## 2016-04-25 DIAGNOSIS — L97529 Non-pressure chronic ulcer of other part of left foot with unspecified severity: Secondary | ICD-10-CM | POA: Diagnosis not present

## 2016-04-25 DIAGNOSIS — L97519 Non-pressure chronic ulcer of other part of right foot with unspecified severity: Secondary | ICD-10-CM | POA: Diagnosis not present

## 2016-05-09 DIAGNOSIS — L97519 Non-pressure chronic ulcer of other part of right foot with unspecified severity: Secondary | ICD-10-CM | POA: Diagnosis not present

## 2016-05-09 DIAGNOSIS — L97529 Non-pressure chronic ulcer of other part of left foot with unspecified severity: Secondary | ICD-10-CM | POA: Diagnosis not present

## 2016-05-09 DIAGNOSIS — E1142 Type 2 diabetes mellitus with diabetic polyneuropathy: Secondary | ICD-10-CM | POA: Diagnosis not present

## 2016-05-16 DIAGNOSIS — L97519 Non-pressure chronic ulcer of other part of right foot with unspecified severity: Secondary | ICD-10-CM | POA: Diagnosis not present

## 2016-05-16 DIAGNOSIS — E1142 Type 2 diabetes mellitus with diabetic polyneuropathy: Secondary | ICD-10-CM | POA: Diagnosis not present

## 2016-05-16 DIAGNOSIS — L97529 Non-pressure chronic ulcer of other part of left foot with unspecified severity: Secondary | ICD-10-CM | POA: Diagnosis not present

## 2016-05-20 DIAGNOSIS — I499 Cardiac arrhythmia, unspecified: Secondary | ICD-10-CM

## 2016-05-20 DIAGNOSIS — K5790 Diverticulosis of intestine, part unspecified, without perforation or abscess without bleeding: Secondary | ICD-10-CM

## 2016-05-20 HISTORY — DX: Cardiac arrhythmia, unspecified: I49.9

## 2016-05-20 HISTORY — DX: Diverticulosis of intestine, part unspecified, without perforation or abscess without bleeding: K57.90

## 2016-05-23 DIAGNOSIS — L03115 Cellulitis of right lower limb: Secondary | ICD-10-CM | POA: Diagnosis not present

## 2016-05-23 DIAGNOSIS — L97512 Non-pressure chronic ulcer of other part of right foot with fat layer exposed: Secondary | ICD-10-CM | POA: Diagnosis not present

## 2016-05-23 DIAGNOSIS — L97521 Non-pressure chronic ulcer of other part of left foot limited to breakdown of skin: Secondary | ICD-10-CM | POA: Diagnosis not present

## 2016-05-23 DIAGNOSIS — E1142 Type 2 diabetes mellitus with diabetic polyneuropathy: Secondary | ICD-10-CM | POA: Diagnosis not present

## 2016-05-30 DIAGNOSIS — L97529 Non-pressure chronic ulcer of other part of left foot with unspecified severity: Secondary | ICD-10-CM | POA: Diagnosis not present

## 2016-05-30 DIAGNOSIS — E1142 Type 2 diabetes mellitus with diabetic polyneuropathy: Secondary | ICD-10-CM | POA: Diagnosis not present

## 2016-05-30 DIAGNOSIS — L97519 Non-pressure chronic ulcer of other part of right foot with unspecified severity: Secondary | ICD-10-CM | POA: Diagnosis not present

## 2016-06-12 DIAGNOSIS — L97521 Non-pressure chronic ulcer of other part of left foot limited to breakdown of skin: Secondary | ICD-10-CM | POA: Diagnosis not present

## 2016-06-12 DIAGNOSIS — E1342 Other specified diabetes mellitus with diabetic polyneuropathy: Secondary | ICD-10-CM | POA: Diagnosis not present

## 2016-06-12 DIAGNOSIS — L97512 Non-pressure chronic ulcer of other part of right foot with fat layer exposed: Secondary | ICD-10-CM | POA: Diagnosis not present

## 2016-06-17 DIAGNOSIS — E1342 Other specified diabetes mellitus with diabetic polyneuropathy: Secondary | ICD-10-CM | POA: Diagnosis not present

## 2016-06-17 DIAGNOSIS — L97521 Non-pressure chronic ulcer of other part of left foot limited to breakdown of skin: Secondary | ICD-10-CM | POA: Diagnosis not present

## 2016-06-17 DIAGNOSIS — L97511 Non-pressure chronic ulcer of other part of right foot limited to breakdown of skin: Secondary | ICD-10-CM | POA: Diagnosis not present

## 2016-06-19 DIAGNOSIS — E559 Vitamin D deficiency, unspecified: Secondary | ICD-10-CM | POA: Diagnosis not present

## 2016-06-19 DIAGNOSIS — R809 Proteinuria, unspecified: Secondary | ICD-10-CM | POA: Diagnosis not present

## 2016-06-19 DIAGNOSIS — Z79899 Other long term (current) drug therapy: Secondary | ICD-10-CM | POA: Diagnosis not present

## 2016-06-19 DIAGNOSIS — I10 Essential (primary) hypertension: Secondary | ICD-10-CM | POA: Diagnosis not present

## 2016-06-19 DIAGNOSIS — D509 Iron deficiency anemia, unspecified: Secondary | ICD-10-CM | POA: Diagnosis not present

## 2016-06-19 DIAGNOSIS — N183 Chronic kidney disease, stage 3 (moderate): Secondary | ICD-10-CM | POA: Diagnosis not present

## 2016-06-20 DIAGNOSIS — I714 Abdominal aortic aneurysm, without rupture, unspecified: Secondary | ICD-10-CM

## 2016-06-20 HISTORY — DX: Abdominal aortic aneurysm, without rupture, unspecified: I71.40

## 2016-06-20 HISTORY — DX: Abdominal aortic aneurysm, without rupture: I71.4

## 2016-06-24 DIAGNOSIS — L97521 Non-pressure chronic ulcer of other part of left foot limited to breakdown of skin: Secondary | ICD-10-CM | POA: Diagnosis not present

## 2016-06-24 DIAGNOSIS — E1342 Other specified diabetes mellitus with diabetic polyneuropathy: Secondary | ICD-10-CM | POA: Diagnosis not present

## 2016-06-24 DIAGNOSIS — L97512 Non-pressure chronic ulcer of other part of right foot with fat layer exposed: Secondary | ICD-10-CM | POA: Diagnosis not present

## 2016-06-26 DIAGNOSIS — R809 Proteinuria, unspecified: Secondary | ICD-10-CM | POA: Diagnosis not present

## 2016-06-26 DIAGNOSIS — E876 Hypokalemia: Secondary | ICD-10-CM | POA: Diagnosis not present

## 2016-06-26 DIAGNOSIS — E559 Vitamin D deficiency, unspecified: Secondary | ICD-10-CM | POA: Diagnosis not present

## 2016-06-26 DIAGNOSIS — N184 Chronic kidney disease, stage 4 (severe): Secondary | ICD-10-CM | POA: Diagnosis not present

## 2016-06-26 DIAGNOSIS — I10 Essential (primary) hypertension: Secondary | ICD-10-CM | POA: Diagnosis not present

## 2016-06-26 DIAGNOSIS — N2581 Secondary hyperparathyroidism of renal origin: Secondary | ICD-10-CM | POA: Diagnosis not present

## 2016-07-01 DIAGNOSIS — L97512 Non-pressure chronic ulcer of other part of right foot with fat layer exposed: Secondary | ICD-10-CM | POA: Diagnosis not present

## 2016-07-01 DIAGNOSIS — L97521 Non-pressure chronic ulcer of other part of left foot limited to breakdown of skin: Secondary | ICD-10-CM | POA: Diagnosis not present

## 2016-07-01 DIAGNOSIS — E1342 Other specified diabetes mellitus with diabetic polyneuropathy: Secondary | ICD-10-CM | POA: Diagnosis not present

## 2016-07-05 ENCOUNTER — Other Ambulatory Visit: Payer: Self-pay | Admitting: Physician Assistant

## 2016-07-05 ENCOUNTER — Ambulatory Visit
Admission: RE | Admit: 2016-07-05 | Discharge: 2016-07-05 | Disposition: A | Discharge status: Home / Self Care | Payer: Medicare Other | Source: Ambulatory Visit | Attending: Physician Assistant | Admitting: Physician Assistant

## 2016-07-05 ENCOUNTER — Other Ambulatory Visit (HOSPITAL_COMMUNITY): Payer: Self-pay | Admitting: Physician Assistant

## 2016-07-05 DIAGNOSIS — R1011 Right upper quadrant pain: Secondary | ICD-10-CM | POA: Diagnosis not present

## 2016-07-05 DIAGNOSIS — R1010 Upper abdominal pain, unspecified: Secondary | ICD-10-CM

## 2016-07-05 DIAGNOSIS — D3A00 Benign carcinoid tumor of unspecified site: Secondary | ICD-10-CM

## 2016-07-05 DIAGNOSIS — K5900 Constipation, unspecified: Secondary | ICD-10-CM

## 2016-07-05 DIAGNOSIS — R1084 Generalized abdominal pain: Secondary | ICD-10-CM | POA: Diagnosis not present

## 2016-07-05 DIAGNOSIS — Z86010 Personal history of colonic polyps: Secondary | ICD-10-CM | POA: Diagnosis not present

## 2016-07-05 DIAGNOSIS — K5909 Other constipation: Secondary | ICD-10-CM | POA: Diagnosis not present

## 2016-07-08 ENCOUNTER — Ambulatory Visit (HOSPITAL_COMMUNITY)
Admission: RE | Admit: 2016-07-08 | Discharge: 2016-07-08 | Disposition: A | Discharge status: Home / Self Care | Payer: Medicare Other | Source: Ambulatory Visit | Attending: Physician Assistant | Admitting: Physician Assistant

## 2016-07-08 DIAGNOSIS — I714 Abdominal aortic aneurysm, without rupture: Secondary | ICD-10-CM | POA: Insufficient documentation

## 2016-07-08 DIAGNOSIS — R109 Unspecified abdominal pain: Secondary | ICD-10-CM | POA: Diagnosis not present

## 2016-07-08 DIAGNOSIS — R1010 Upper abdominal pain, unspecified: Secondary | ICD-10-CM | POA: Diagnosis not present

## 2016-07-08 DIAGNOSIS — E1342 Other specified diabetes mellitus with diabetic polyneuropathy: Secondary | ICD-10-CM | POA: Diagnosis not present

## 2016-07-08 DIAGNOSIS — L97512 Non-pressure chronic ulcer of other part of right foot with fat layer exposed: Secondary | ICD-10-CM | POA: Diagnosis not present

## 2016-07-08 DIAGNOSIS — D3A00 Benign carcinoid tumor of unspecified site: Secondary | ICD-10-CM | POA: Insufficient documentation

## 2016-07-08 DIAGNOSIS — R1011 Right upper quadrant pain: Secondary | ICD-10-CM | POA: Insufficient documentation

## 2016-07-08 MED ORDER — IOPAMIDOL (ISOVUE-300) INJECTION 61%
INTRAVENOUS | Status: AC
  Filled 2016-07-08: qty 30

## 2016-07-10 ENCOUNTER — Telehealth: Payer: Self-pay | Admitting: Cardiology

## 2016-07-10 NOTE — Telephone Encounter (Signed)
-----   Message from Arnoldo Lenis, MD sent at 07/10/2016 12:06 PM EST ----- CT results not received through fax. I reviewed the results of the study through his online record. 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 There is nothing new to do but continue to monitor with repeat US next year.   Carlyle Dolly MD

## 2016-07-10 NOTE — Telephone Encounter (Signed)
Patient notified and voiced understanding.

## 2016-07-10 NOTE — Telephone Encounter (Signed)
Patient is calling to discuss CT Scan faxed to Dr.Branch on 2/20 from patient's GI physician. / tg

## 2016-07-15 ENCOUNTER — Other Ambulatory Visit: Payer: Self-pay | Admitting: Cardiology

## 2016-07-15 DIAGNOSIS — L97512 Non-pressure chronic ulcer of other part of right foot with fat layer exposed: Secondary | ICD-10-CM | POA: Diagnosis not present

## 2016-07-15 DIAGNOSIS — E1342 Other specified diabetes mellitus with diabetic polyneuropathy: Secondary | ICD-10-CM | POA: Diagnosis not present

## 2016-07-19 DIAGNOSIS — R1084 Generalized abdominal pain: Secondary | ICD-10-CM | POA: Diagnosis not present

## 2016-07-19 DIAGNOSIS — Z86010 Personal history of colonic polyps: Secondary | ICD-10-CM | POA: Diagnosis not present

## 2016-07-19 DIAGNOSIS — N189 Chronic kidney disease, unspecified: Secondary | ICD-10-CM | POA: Diagnosis not present

## 2016-07-19 DIAGNOSIS — D3A00 Benign carcinoid tumor of unspecified site: Secondary | ICD-10-CM | POA: Diagnosis not present

## 2016-07-19 DIAGNOSIS — E1165 Type 2 diabetes mellitus with hyperglycemia: Secondary | ICD-10-CM | POA: Diagnosis not present

## 2016-07-19 DIAGNOSIS — E1140 Type 2 diabetes mellitus with diabetic neuropathy, unspecified: Secondary | ICD-10-CM | POA: Diagnosis not present

## 2016-07-19 DIAGNOSIS — K5900 Constipation, unspecified: Secondary | ICD-10-CM | POA: Diagnosis not present

## 2016-07-22 DIAGNOSIS — L97512 Non-pressure chronic ulcer of other part of right foot with fat layer exposed: Secondary | ICD-10-CM | POA: Diagnosis not present

## 2016-07-22 DIAGNOSIS — E1142 Type 2 diabetes mellitus with diabetic polyneuropathy: Secondary | ICD-10-CM | POA: Diagnosis not present

## 2016-07-29 DIAGNOSIS — L97512 Non-pressure chronic ulcer of other part of right foot with fat layer exposed: Secondary | ICD-10-CM | POA: Diagnosis not present

## 2016-07-29 DIAGNOSIS — E1142 Type 2 diabetes mellitus with diabetic polyneuropathy: Secondary | ICD-10-CM | POA: Diagnosis not present

## 2016-08-05 DIAGNOSIS — L97512 Non-pressure chronic ulcer of other part of right foot with fat layer exposed: Secondary | ICD-10-CM | POA: Diagnosis not present

## 2016-08-05 DIAGNOSIS — E1142 Type 2 diabetes mellitus with diabetic polyneuropathy: Secondary | ICD-10-CM | POA: Diagnosis not present

## 2016-08-05 DIAGNOSIS — L97522 Non-pressure chronic ulcer of other part of left foot with fat layer exposed: Secondary | ICD-10-CM | POA: Diagnosis not present

## 2016-08-12 DIAGNOSIS — L97512 Non-pressure chronic ulcer of other part of right foot with fat layer exposed: Secondary | ICD-10-CM | POA: Diagnosis not present

## 2016-08-12 DIAGNOSIS — E1142 Type 2 diabetes mellitus with diabetic polyneuropathy: Secondary | ICD-10-CM | POA: Diagnosis not present

## 2016-08-12 DIAGNOSIS — L97522 Non-pressure chronic ulcer of other part of left foot with fat layer exposed: Secondary | ICD-10-CM | POA: Diagnosis not present

## 2016-08-21 DIAGNOSIS — D3A00 Benign carcinoid tumor of unspecified site: Secondary | ICD-10-CM | POA: Diagnosis not present

## 2016-08-21 DIAGNOSIS — E1142 Type 2 diabetes mellitus with diabetic polyneuropathy: Secondary | ICD-10-CM | POA: Diagnosis not present

## 2016-08-21 DIAGNOSIS — K219 Gastro-esophageal reflux disease without esophagitis: Secondary | ICD-10-CM | POA: Diagnosis not present

## 2016-08-21 DIAGNOSIS — K5909 Other constipation: Secondary | ICD-10-CM | POA: Diagnosis not present

## 2016-08-21 DIAGNOSIS — N183 Chronic kidney disease, stage 3 (moderate): Secondary | ICD-10-CM | POA: Diagnosis not present

## 2016-08-21 DIAGNOSIS — Z86010 Personal history of colonic polyps: Secondary | ICD-10-CM | POA: Diagnosis not present

## 2016-08-21 DIAGNOSIS — Z794 Long term (current) use of insulin: Secondary | ICD-10-CM | POA: Diagnosis not present

## 2016-08-22 DIAGNOSIS — L97522 Non-pressure chronic ulcer of other part of left foot with fat layer exposed: Secondary | ICD-10-CM | POA: Diagnosis not present

## 2016-08-22 DIAGNOSIS — L97512 Non-pressure chronic ulcer of other part of right foot with fat layer exposed: Secondary | ICD-10-CM | POA: Diagnosis not present

## 2016-08-22 DIAGNOSIS — E1142 Type 2 diabetes mellitus with diabetic polyneuropathy: Secondary | ICD-10-CM | POA: Diagnosis not present

## 2016-08-29 DIAGNOSIS — L97522 Non-pressure chronic ulcer of other part of left foot with fat layer exposed: Secondary | ICD-10-CM | POA: Diagnosis not present

## 2016-08-29 DIAGNOSIS — L97512 Non-pressure chronic ulcer of other part of right foot with fat layer exposed: Secondary | ICD-10-CM | POA: Diagnosis not present

## 2016-08-29 DIAGNOSIS — E1142 Type 2 diabetes mellitus with diabetic polyneuropathy: Secondary | ICD-10-CM | POA: Diagnosis not present

## 2016-09-09 DIAGNOSIS — L97512 Non-pressure chronic ulcer of other part of right foot with fat layer exposed: Secondary | ICD-10-CM | POA: Diagnosis not present

## 2016-09-09 DIAGNOSIS — L97522 Non-pressure chronic ulcer of other part of left foot with fat layer exposed: Secondary | ICD-10-CM | POA: Diagnosis not present

## 2016-09-09 DIAGNOSIS — E1142 Type 2 diabetes mellitus with diabetic polyneuropathy: Secondary | ICD-10-CM | POA: Diagnosis not present

## 2016-09-13 DIAGNOSIS — K3189 Other diseases of stomach and duodenum: Secondary | ICD-10-CM | POA: Diagnosis not present

## 2016-09-13 DIAGNOSIS — D175 Benign lipomatous neoplasm of intra-abdominal organs: Secondary | ICD-10-CM | POA: Diagnosis not present

## 2016-09-13 DIAGNOSIS — K449 Diaphragmatic hernia without obstruction or gangrene: Secondary | ICD-10-CM | POA: Diagnosis not present

## 2016-09-13 DIAGNOSIS — Z86010 Personal history of colonic polyps: Secondary | ICD-10-CM | POA: Diagnosis not present

## 2016-09-13 DIAGNOSIS — D120 Benign neoplasm of cecum: Secondary | ICD-10-CM | POA: Diagnosis not present

## 2016-09-13 DIAGNOSIS — K219 Gastro-esophageal reflux disease without esophagitis: Secondary | ICD-10-CM | POA: Diagnosis not present

## 2016-09-13 DIAGNOSIS — K640 First degree hemorrhoids: Secondary | ICD-10-CM | POA: Diagnosis not present

## 2016-09-17 DIAGNOSIS — D120 Benign neoplasm of cecum: Secondary | ICD-10-CM | POA: Diagnosis not present

## 2016-09-17 DIAGNOSIS — K3189 Other diseases of stomach and duodenum: Secondary | ICD-10-CM | POA: Diagnosis not present

## 2016-09-18 ENCOUNTER — Other Ambulatory Visit: Payer: Self-pay | Admitting: Cardiology

## 2016-09-19 DIAGNOSIS — E1142 Type 2 diabetes mellitus with diabetic polyneuropathy: Secondary | ICD-10-CM | POA: Diagnosis not present

## 2016-09-19 DIAGNOSIS — L97522 Non-pressure chronic ulcer of other part of left foot with fat layer exposed: Secondary | ICD-10-CM | POA: Diagnosis not present

## 2016-09-19 DIAGNOSIS — L97512 Non-pressure chronic ulcer of other part of right foot with fat layer exposed: Secondary | ICD-10-CM | POA: Diagnosis not present

## 2016-09-26 DIAGNOSIS — Z86010 Personal history of colonic polyps: Secondary | ICD-10-CM | POA: Diagnosis not present

## 2016-09-26 DIAGNOSIS — Z794 Long term (current) use of insulin: Secondary | ICD-10-CM | POA: Diagnosis not present

## 2016-09-26 DIAGNOSIS — D3A00 Benign carcinoid tumor of unspecified site: Secondary | ICD-10-CM | POA: Diagnosis not present

## 2016-09-26 DIAGNOSIS — L97512 Non-pressure chronic ulcer of other part of right foot with fat layer exposed: Secondary | ICD-10-CM | POA: Diagnosis not present

## 2016-09-26 DIAGNOSIS — K5909 Other constipation: Secondary | ICD-10-CM | POA: Diagnosis not present

## 2016-09-26 DIAGNOSIS — E1142 Type 2 diabetes mellitus with diabetic polyneuropathy: Secondary | ICD-10-CM | POA: Diagnosis not present

## 2016-09-26 DIAGNOSIS — K219 Gastro-esophageal reflux disease without esophagitis: Secondary | ICD-10-CM | POA: Diagnosis not present

## 2016-09-26 DIAGNOSIS — L97522 Non-pressure chronic ulcer of other part of left foot with fat layer exposed: Secondary | ICD-10-CM | POA: Diagnosis not present

## 2016-09-26 DIAGNOSIS — R1084 Generalized abdominal pain: Secondary | ICD-10-CM | POA: Diagnosis not present

## 2016-10-01 ENCOUNTER — Other Ambulatory Visit: Payer: Self-pay | Admitting: Gastroenterology

## 2016-10-01 DIAGNOSIS — R1084 Generalized abdominal pain: Secondary | ICD-10-CM

## 2016-10-03 DIAGNOSIS — L97512 Non-pressure chronic ulcer of other part of right foot with fat layer exposed: Secondary | ICD-10-CM | POA: Diagnosis not present

## 2016-10-03 DIAGNOSIS — L97522 Non-pressure chronic ulcer of other part of left foot with fat layer exposed: Secondary | ICD-10-CM | POA: Diagnosis not present

## 2016-10-03 DIAGNOSIS — E1142 Type 2 diabetes mellitus with diabetic polyneuropathy: Secondary | ICD-10-CM | POA: Diagnosis not present

## 2016-10-10 ENCOUNTER — Other Ambulatory Visit: Payer: Self-pay | Admitting: Gastroenterology

## 2016-10-10 ENCOUNTER — Ambulatory Visit
Admission: RE | Admit: 2016-10-10 | Discharge: 2016-10-10 | Disposition: A | Discharge status: Home / Self Care | Payer: Medicare Other | Source: Ambulatory Visit | Attending: Gastroenterology | Admitting: Gastroenterology

## 2016-10-10 DIAGNOSIS — R1084 Generalized abdominal pain: Secondary | ICD-10-CM

## 2016-10-10 DIAGNOSIS — N183 Chronic kidney disease, stage 3 (moderate): Secondary | ICD-10-CM | POA: Diagnosis not present

## 2016-10-11 DIAGNOSIS — E1142 Type 2 diabetes mellitus with diabetic polyneuropathy: Secondary | ICD-10-CM | POA: Diagnosis not present

## 2016-10-11 DIAGNOSIS — L97512 Non-pressure chronic ulcer of other part of right foot with fat layer exposed: Secondary | ICD-10-CM | POA: Diagnosis not present

## 2016-10-11 DIAGNOSIS — L97522 Non-pressure chronic ulcer of other part of left foot with fat layer exposed: Secondary | ICD-10-CM | POA: Diagnosis not present

## 2016-10-18 DIAGNOSIS — E1122 Type 2 diabetes mellitus with diabetic chronic kidney disease: Secondary | ICD-10-CM | POA: Diagnosis not present

## 2016-10-18 DIAGNOSIS — E039 Hypothyroidism, unspecified: Secondary | ICD-10-CM | POA: Diagnosis not present

## 2016-10-18 DIAGNOSIS — Z720 Tobacco use: Secondary | ICD-10-CM | POA: Diagnosis not present

## 2016-10-18 DIAGNOSIS — E1165 Type 2 diabetes mellitus with hyperglycemia: Secondary | ICD-10-CM | POA: Diagnosis not present

## 2016-10-18 DIAGNOSIS — K219 Gastro-esophageal reflux disease without esophagitis: Secondary | ICD-10-CM | POA: Diagnosis not present

## 2016-10-18 DIAGNOSIS — E7800 Pure hypercholesterolemia, unspecified: Secondary | ICD-10-CM | POA: Diagnosis not present

## 2016-10-18 DIAGNOSIS — I10 Essential (primary) hypertension: Secondary | ICD-10-CM | POA: Diagnosis not present

## 2016-10-18 DIAGNOSIS — N189 Chronic kidney disease, unspecified: Secondary | ICD-10-CM | POA: Diagnosis not present

## 2016-10-21 DIAGNOSIS — E039 Hypothyroidism, unspecified: Secondary | ICD-10-CM | POA: Diagnosis not present

## 2016-10-21 DIAGNOSIS — E1122 Type 2 diabetes mellitus with diabetic chronic kidney disease: Secondary | ICD-10-CM | POA: Diagnosis not present

## 2016-10-21 DIAGNOSIS — Z720 Tobacco use: Secondary | ICD-10-CM | POA: Diagnosis not present

## 2016-10-21 DIAGNOSIS — Z6833 Body mass index (BMI) 33.0-33.9, adult: Secondary | ICD-10-CM | POA: Diagnosis not present

## 2016-10-22 DIAGNOSIS — E1142 Type 2 diabetes mellitus with diabetic polyneuropathy: Secondary | ICD-10-CM | POA: Diagnosis not present

## 2016-10-22 DIAGNOSIS — L97512 Non-pressure chronic ulcer of other part of right foot with fat layer exposed: Secondary | ICD-10-CM | POA: Diagnosis not present

## 2016-10-22 DIAGNOSIS — L97522 Non-pressure chronic ulcer of other part of left foot with fat layer exposed: Secondary | ICD-10-CM | POA: Diagnosis not present

## 2016-10-29 DIAGNOSIS — L03115 Cellulitis of right lower limb: Secondary | ICD-10-CM | POA: Diagnosis not present

## 2016-10-29 DIAGNOSIS — L97522 Non-pressure chronic ulcer of other part of left foot with fat layer exposed: Secondary | ICD-10-CM | POA: Diagnosis not present

## 2016-10-29 DIAGNOSIS — L97512 Non-pressure chronic ulcer of other part of right foot with fat layer exposed: Secondary | ICD-10-CM | POA: Diagnosis not present

## 2016-10-29 DIAGNOSIS — E1142 Type 2 diabetes mellitus with diabetic polyneuropathy: Secondary | ICD-10-CM | POA: Diagnosis not present

## 2016-11-04 DIAGNOSIS — L97512 Non-pressure chronic ulcer of other part of right foot with fat layer exposed: Secondary | ICD-10-CM | POA: Diagnosis not present

## 2016-11-04 DIAGNOSIS — L97522 Non-pressure chronic ulcer of other part of left foot with fat layer exposed: Secondary | ICD-10-CM | POA: Diagnosis not present

## 2016-11-04 DIAGNOSIS — E1142 Type 2 diabetes mellitus with diabetic polyneuropathy: Secondary | ICD-10-CM | POA: Diagnosis not present

## 2016-11-08 DIAGNOSIS — E1142 Type 2 diabetes mellitus with diabetic polyneuropathy: Secondary | ICD-10-CM | POA: Diagnosis not present

## 2016-11-08 DIAGNOSIS — L97522 Non-pressure chronic ulcer of other part of left foot with fat layer exposed: Secondary | ICD-10-CM | POA: Diagnosis not present

## 2016-11-08 DIAGNOSIS — L97512 Non-pressure chronic ulcer of other part of right foot with fat layer exposed: Secondary | ICD-10-CM | POA: Diagnosis not present

## 2016-11-08 DIAGNOSIS — T25222A Burn of second degree of left foot, initial encounter: Secondary | ICD-10-CM | POA: Diagnosis not present

## 2016-11-08 DIAGNOSIS — T25221A Burn of second degree of right foot, initial encounter: Secondary | ICD-10-CM | POA: Diagnosis not present

## 2016-11-11 DIAGNOSIS — L97512 Non-pressure chronic ulcer of other part of right foot with fat layer exposed: Secondary | ICD-10-CM | POA: Diagnosis not present

## 2016-11-11 DIAGNOSIS — T25221A Burn of second degree of right foot, initial encounter: Secondary | ICD-10-CM | POA: Diagnosis not present

## 2016-11-11 DIAGNOSIS — T25222A Burn of second degree of left foot, initial encounter: Secondary | ICD-10-CM | POA: Diagnosis not present

## 2016-11-11 DIAGNOSIS — L97522 Non-pressure chronic ulcer of other part of left foot with fat layer exposed: Secondary | ICD-10-CM | POA: Diagnosis not present

## 2016-11-14 DIAGNOSIS — N183 Chronic kidney disease, stage 3 (moderate): Secondary | ICD-10-CM | POA: Diagnosis not present

## 2016-11-14 DIAGNOSIS — E669 Obesity, unspecified: Secondary | ICD-10-CM | POA: Diagnosis not present

## 2016-11-14 DIAGNOSIS — E1165 Type 2 diabetes mellitus with hyperglycemia: Secondary | ICD-10-CM | POA: Diagnosis not present

## 2016-11-14 DIAGNOSIS — I10 Essential (primary) hypertension: Secondary | ICD-10-CM | POA: Diagnosis not present

## 2016-11-18 DIAGNOSIS — E1142 Type 2 diabetes mellitus with diabetic polyneuropathy: Secondary | ICD-10-CM | POA: Diagnosis not present

## 2016-11-18 DIAGNOSIS — L97512 Non-pressure chronic ulcer of other part of right foot with fat layer exposed: Secondary | ICD-10-CM | POA: Diagnosis not present

## 2016-11-18 DIAGNOSIS — L97529 Non-pressure chronic ulcer of other part of left foot with unspecified severity: Secondary | ICD-10-CM | POA: Diagnosis not present

## 2016-11-18 DIAGNOSIS — T25221D Burn of second degree of right foot, subsequent encounter: Secondary | ICD-10-CM | POA: Diagnosis not present

## 2016-11-18 DIAGNOSIS — T25222D Burn of second degree of left foot, subsequent encounter: Secondary | ICD-10-CM | POA: Diagnosis not present

## 2016-11-28 DIAGNOSIS — N183 Chronic kidney disease, stage 3 (moderate): Secondary | ICD-10-CM | POA: Diagnosis not present

## 2016-11-28 DIAGNOSIS — E1122 Type 2 diabetes mellitus with diabetic chronic kidney disease: Secondary | ICD-10-CM | POA: Diagnosis not present

## 2016-11-28 DIAGNOSIS — Z86010 Personal history of colonic polyps: Secondary | ICD-10-CM | POA: Diagnosis not present

## 2016-11-28 DIAGNOSIS — E11621 Type 2 diabetes mellitus with foot ulcer: Secondary | ICD-10-CM | POA: Diagnosis not present

## 2016-11-28 DIAGNOSIS — D3A00 Benign carcinoid tumor of unspecified site: Secondary | ICD-10-CM | POA: Diagnosis not present

## 2016-11-28 DIAGNOSIS — E1142 Type 2 diabetes mellitus with diabetic polyneuropathy: Secondary | ICD-10-CM | POA: Diagnosis not present

## 2016-11-28 DIAGNOSIS — K5909 Other constipation: Secondary | ICD-10-CM | POA: Diagnosis not present

## 2016-11-28 DIAGNOSIS — Z794 Long term (current) use of insulin: Secondary | ICD-10-CM | POA: Diagnosis not present

## 2016-11-29 DIAGNOSIS — E1142 Type 2 diabetes mellitus with diabetic polyneuropathy: Secondary | ICD-10-CM | POA: Diagnosis not present

## 2016-11-29 DIAGNOSIS — S8012XA Contusion of left lower leg, initial encounter: Secondary | ICD-10-CM | POA: Diagnosis not present

## 2016-11-29 DIAGNOSIS — T25221A Burn of second degree of right foot, initial encounter: Secondary | ICD-10-CM | POA: Diagnosis not present

## 2016-11-29 DIAGNOSIS — L97512 Non-pressure chronic ulcer of other part of right foot with fat layer exposed: Secondary | ICD-10-CM | POA: Diagnosis not present

## 2016-11-29 DIAGNOSIS — T25222A Burn of second degree of left foot, initial encounter: Secondary | ICD-10-CM | POA: Diagnosis not present

## 2016-12-04 DIAGNOSIS — E669 Obesity, unspecified: Secondary | ICD-10-CM | POA: Diagnosis not present

## 2016-12-04 DIAGNOSIS — E1165 Type 2 diabetes mellitus with hyperglycemia: Secondary | ICD-10-CM | POA: Diagnosis not present

## 2016-12-04 DIAGNOSIS — I10 Essential (primary) hypertension: Secondary | ICD-10-CM | POA: Diagnosis not present

## 2016-12-04 DIAGNOSIS — N183 Chronic kidney disease, stage 3 (moderate): Secondary | ICD-10-CM | POA: Diagnosis not present

## 2016-12-06 DIAGNOSIS — L97519 Non-pressure chronic ulcer of other part of right foot with unspecified severity: Secondary | ICD-10-CM | POA: Diagnosis not present

## 2016-12-06 DIAGNOSIS — S8012XD Contusion of left lower leg, subsequent encounter: Secondary | ICD-10-CM | POA: Diagnosis not present

## 2016-12-06 DIAGNOSIS — T25221D Burn of second degree of right foot, subsequent encounter: Secondary | ICD-10-CM | POA: Diagnosis not present

## 2016-12-06 DIAGNOSIS — T25222D Burn of second degree of left foot, subsequent encounter: Secondary | ICD-10-CM | POA: Diagnosis not present

## 2016-12-11 DIAGNOSIS — E039 Hypothyroidism, unspecified: Secondary | ICD-10-CM | POA: Diagnosis not present

## 2016-12-11 DIAGNOSIS — E1122 Type 2 diabetes mellitus with diabetic chronic kidney disease: Secondary | ICD-10-CM | POA: Diagnosis not present

## 2016-12-11 DIAGNOSIS — Z6833 Body mass index (BMI) 33.0-33.9, adult: Secondary | ICD-10-CM | POA: Diagnosis not present

## 2016-12-11 DIAGNOSIS — Z720 Tobacco use: Secondary | ICD-10-CM | POA: Diagnosis not present

## 2016-12-30 DIAGNOSIS — L97522 Non-pressure chronic ulcer of other part of left foot with fat layer exposed: Secondary | ICD-10-CM | POA: Diagnosis not present

## 2016-12-30 DIAGNOSIS — E1142 Type 2 diabetes mellitus with diabetic polyneuropathy: Secondary | ICD-10-CM | POA: Diagnosis not present

## 2016-12-31 DIAGNOSIS — E669 Obesity, unspecified: Secondary | ICD-10-CM | POA: Diagnosis not present

## 2016-12-31 DIAGNOSIS — E1165 Type 2 diabetes mellitus with hyperglycemia: Secondary | ICD-10-CM | POA: Diagnosis not present

## 2016-12-31 DIAGNOSIS — I10 Essential (primary) hypertension: Secondary | ICD-10-CM | POA: Diagnosis not present

## 2016-12-31 DIAGNOSIS — N183 Chronic kidney disease, stage 3 (moderate): Secondary | ICD-10-CM | POA: Diagnosis not present

## 2017-01-13 DIAGNOSIS — E1142 Type 2 diabetes mellitus with diabetic polyneuropathy: Secondary | ICD-10-CM | POA: Diagnosis not present

## 2017-01-13 DIAGNOSIS — L97522 Non-pressure chronic ulcer of other part of left foot with fat layer exposed: Secondary | ICD-10-CM | POA: Diagnosis not present

## 2017-01-28 DIAGNOSIS — E1142 Type 2 diabetes mellitus with diabetic polyneuropathy: Secondary | ICD-10-CM | POA: Diagnosis not present

## 2017-01-28 DIAGNOSIS — L97522 Non-pressure chronic ulcer of other part of left foot with fat layer exposed: Secondary | ICD-10-CM | POA: Diagnosis not present

## 2017-02-11 DIAGNOSIS — L97822 Non-pressure chronic ulcer of other part of left lower leg with fat layer exposed: Secondary | ICD-10-CM | POA: Diagnosis not present

## 2017-02-11 DIAGNOSIS — E1142 Type 2 diabetes mellitus with diabetic polyneuropathy: Secondary | ICD-10-CM | POA: Diagnosis not present

## 2017-02-25 DIAGNOSIS — E1142 Type 2 diabetes mellitus with diabetic polyneuropathy: Secondary | ICD-10-CM | POA: Diagnosis not present

## 2017-02-25 DIAGNOSIS — L97529 Non-pressure chronic ulcer of other part of left foot with unspecified severity: Secondary | ICD-10-CM | POA: Diagnosis not present

## 2017-03-04 DIAGNOSIS — I10 Essential (primary) hypertension: Secondary | ICD-10-CM | POA: Diagnosis not present

## 2017-03-04 DIAGNOSIS — E1165 Type 2 diabetes mellitus with hyperglycemia: Secondary | ICD-10-CM | POA: Diagnosis not present

## 2017-03-04 DIAGNOSIS — N183 Chronic kidney disease, stage 3 (moderate): Secondary | ICD-10-CM | POA: Diagnosis not present

## 2017-03-04 DIAGNOSIS — E669 Obesity, unspecified: Secondary | ICD-10-CM | POA: Diagnosis not present

## 2017-03-10 ENCOUNTER — Other Ambulatory Visit: Payer: Self-pay | Admitting: Cardiology

## 2017-03-11 DIAGNOSIS — E1142 Type 2 diabetes mellitus with diabetic polyneuropathy: Secondary | ICD-10-CM | POA: Diagnosis not present

## 2017-03-11 DIAGNOSIS — L97522 Non-pressure chronic ulcer of other part of left foot with fat layer exposed: Secondary | ICD-10-CM | POA: Diagnosis not present

## 2017-03-12 DIAGNOSIS — Z86010 Personal history of colonic polyps: Secondary | ICD-10-CM | POA: Diagnosis not present

## 2017-03-12 DIAGNOSIS — E1142 Type 2 diabetes mellitus with diabetic polyneuropathy: Secondary | ICD-10-CM | POA: Diagnosis not present

## 2017-03-12 DIAGNOSIS — D3A00 Benign carcinoid tumor of unspecified site: Secondary | ICD-10-CM | POA: Diagnosis not present

## 2017-03-12 DIAGNOSIS — K5909 Other constipation: Secondary | ICD-10-CM | POA: Diagnosis not present

## 2017-03-12 DIAGNOSIS — E1122 Type 2 diabetes mellitus with diabetic chronic kidney disease: Secondary | ICD-10-CM | POA: Diagnosis not present

## 2017-03-12 DIAGNOSIS — Z794 Long term (current) use of insulin: Secondary | ICD-10-CM | POA: Diagnosis not present

## 2017-03-12 DIAGNOSIS — N183 Chronic kidney disease, stage 3 (moderate): Secondary | ICD-10-CM | POA: Diagnosis not present

## 2017-03-20 ENCOUNTER — Other Ambulatory Visit: Payer: Self-pay | Admitting: Podiatry

## 2017-03-24 DIAGNOSIS — E1142 Type 2 diabetes mellitus with diabetic polyneuropathy: Secondary | ICD-10-CM | POA: Diagnosis not present

## 2017-03-24 DIAGNOSIS — L97522 Non-pressure chronic ulcer of other part of left foot with fat layer exposed: Secondary | ICD-10-CM | POA: Diagnosis not present

## 2017-03-26 ENCOUNTER — Telehealth: Payer: Self-pay | Admitting: *Deleted

## 2017-03-26 NOTE — Telephone Encounter (Signed)
cancellation for tomorrow at 1120 - have LM for pt to return call

## 2017-03-26 NOTE — Telephone Encounter (Signed)
Pt surgery is scheduled 04/23/17, no available appt here or [facility] until January, can we add to extender schedule?

## 2017-03-26 NOTE — Telephone Encounter (Signed)
Needs to be seen prior to clearance   Zandra Abts MD

## 2017-03-26 NOTE — Telephone Encounter (Signed)
[name] from Dr Arvil Persons office requesting clearance for ostectomy of right toe scheduled for 12/5. Pt hasn't been seen since 2017. Routed to Dr [name] if pt needs appt before clearance

## 2017-03-27 ENCOUNTER — Encounter: Payer: Self-pay | Admitting: Cardiology

## 2017-03-27 ENCOUNTER — Encounter: Payer: Self-pay | Admitting: *Deleted

## 2017-03-27 ENCOUNTER — Ambulatory Visit (INDEPENDENT_AMBULATORY_CARE_PROVIDER_SITE_OTHER): Payer: Medicare Other | Admitting: Cardiology

## 2017-03-27 VITALS — BP 144/98 | HR 48 | Ht 76.0 in | Wt 247.0 lb

## 2017-03-27 DIAGNOSIS — I5032 Chronic diastolic (congestive) heart failure: Secondary | ICD-10-CM

## 2017-03-27 DIAGNOSIS — E782 Mixed hyperlipidemia: Secondary | ICD-10-CM

## 2017-03-27 DIAGNOSIS — I2510 Atherosclerotic heart disease of native coronary artery without angina pectoris: Secondary | ICD-10-CM

## 2017-03-27 DIAGNOSIS — I10 Essential (primary) hypertension: Secondary | ICD-10-CM

## 2017-03-27 NOTE — Telephone Encounter (Signed)
Pt will come for OV today @ 11:20 - will send to podiatrist after visit

## 2017-03-27 NOTE — Progress Notes (Signed)
Clinical Summary [name] is a 66 y.o.male seen today for a focused visit for recent troubles with HTN.   1. HTN  - we had previous troubles getting bp contorlled. He would have very high bp's in the AM, then rapidly declining bp in the afternoons that were symptomatic after taking his medications.  - after changing his ramipril to night time this pattern resolved - home bp's typically 120s/70s  - home bp's 130s/80s - compliant with meds  2. CAD  - prior stent to RCA in 1999. Last cath 05/2001 : LM 20-30%, LAD 50-60% And 60-70% mid, LCX with mid AV portion 60-70%, RCA with patent stent with proximal 40-50% disease. LVEF by LVgram 65%, LVEDP 20. Overall moderate lesions, not great anatomically for PCI per notes, treated medically.  - 10/2014 Lexiscan with small area of ischemia mid inferior to apical, overall low risk  - denies any chest pain. No SOB or DOE - exertion limited by foot pain. Does heavy yardwork without troubles. Can walk up flight of stairs without limitation  3. Hyperlipidemia  - compliant with crestor  - has labs upcoming with pcp   4. COPD - followed by pcp  5. CKD - followed by Dr [name]  6. Chronic diasotlic heart failure  - no recent edema. Has some weight loss  7. AAA - needs repeat US next year.   8. Preop evaluatino - plans for right toe surgery by Dr [name] podiatry - tolerates greater than 4METs regularly without troubles.    Past Medical History:  Diagnosis Date  . Asthma   . CAD (coronary artery disease)    multivessel  . Chronic renal insufficiency   . COPD (chronic obstructive pulmonary disease) ([place])    with ongoing tobacco use and patient failed sham takes  . DM2 (diabetes mellitus, type 2) ([place])   . Dyslipidemia   . Edema    chronic lower extremity secondary to right heart failure and chronic venous insufficiency  . Headache   . HTN (hypertension)   . Morbid obesity ([place])   . Pneumonia   . 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   Marland Kitchen Reflux esophagitis    hx  . Renal insufficiency   . Sleep apnea   . Two-vessel coronary artery disease    moderate. by cath in 2003. Status post stenting of the mdi RCA November 1999 normal left ventricular ejection fraction     No Known Allergies   Current Outpatient Medications  Medication Sig Dispense Refill  . albuterol (VENTOLIN HFA) 108 (90 BASE) MCG/ACT inhaler Inhale 2 puffs into the lungs every 6 (six) hours as needed for wheezing or shortness of breath.     Marland Kitchen amLODipine (NORVASC) 10 MG tablet Take 1 tablet (10 mg total) by mouth daily. 90 tablet 3  . aspirin 81 MG tablet Take 81 mg by mouth daily. On hold since 08/08/15    . canagliflozin (INVOKANA) 100 MG TABS tablet Take 100 mg by mouth daily before breakfast.    . carvedilol (COREG) 25 MG tablet Take 37.5 mg by mouth 2 (two) times daily with a meal.    . cefUROXime (CEFTIN) 500 MG tablet Take 1 tablet (500 mg total) by mouth 2 (two) times daily with a meal. Start taking antibiotic on 04/09/2016. 14 tablet 0  . cholecalciferol (VITAMIN D) 1000 UNITS tablet Take 1,000 Units by mouth daily.    . hydrALAZINE (APRESOLINE) 100  MG tablet Take 100 mg by mouth 3 (three) times daily.    . hydrALAZINE (APRESOLINE) 100 MG tablet TAKE ONE TABLET BY MOUTH THREE TIMES DAILY 90 tablet 0  . hydrochlorothiazide (HYDRODIURIL) 25 MG tablet Take 25 mg by mouth daily.    Marland Kitchen HYDROcodone-acetaminophen (NORCO/VICODIN) 5-325 MG tablet Take 1 tablet by mouth every 6 (six) hours as needed for moderate pain. 15 tablet 0  . insulin glargine (LANTUS) 100 UNIT/ML injection Inject 20 Units into the skin every morning.     Marland Kitchen LINZESS 290 MCG CAPS capsule Take 290 mcg by mouth daily as needed (for constipation).   6  . Multiple Vitamin (MULTIVITAMIN) tablet Take 1 tablet by mouth daily.      . nicotine (NICODERM CQ - DOSED IN MG/24 HOURS) 21 mg/24hr patch Place 21  mg onto the skin daily.    . potassium chloride SA (K-DUR,KLOR-CON) 20 MEQ tablet Take 1 tablet (20 mEq total) by mouth daily. 7 tablet 0  . ramipril (ALTACE) 10 MG tablet Take 10 mg by mouth at bedtime.     . rosuvastatin (CRESTOR) 20 MG tablet TAKE ONE TABLET BY MOUTH DAILY 30 tablet 0  . sildenafil (REVATIO) 20 MG tablet Take 2 tablets by mouth daily as needed (erectile disfunction).   5  . torsemide (DEMADEX) 20 MG tablet Take 20 mg by mouth 2 (two) times daily.    . TRULICITY 1.5 GB/1.5VV SOPN INJECT 1.5 MG SUBCUTANEOUSLY ONCE every Wednesday  11   No current facility-administered medications for this visit.      Past Surgical History:  Procedure Laterality Date  . ABDOMINAL SURGERY    . APPENDECTOMY    . BACK SURGERY    . CARDIAC CATHETERIZATION     stent  . CHOLECYSTECTOMY    . CLOSED REDUCTION SHOULDER DISLOCATION    . diabetic ulcers    . GROIN EXPLORATION    . TUMOR REMOVAL     small intestine      No Known Allergies    Family History  Problem Relation Age of Onset  . Diabetes Mother   . Alcohol abuse Father      Social History [name] reports that he has been smoking cigarettes.  He started smoking about 48 years ago. He has a 40.00 pack-year smoking history. he has never used smokeless tobacco. [name] reports that he does not drink alcohol.   Review of Systems CONSTITUTIONAL: No weight loss, fever, chills, weakness or fatigue.  HEENT: Eyes: No visual loss, blurred vision, double vision or yellow sclerae.No hearing loss, sneezing, congestion, runny nose or sore throat.  SKIN: No rash or itching.  CARDIOVASCULAR: per hpi RESPIRATORY: No shortness of breath, cough or sputum.  GASTROINTESTINAL: No anorexia, nausea, vomiting or diarrhea. No abdominal pain or blood.  GENITOURINARY: No burning on urination, no polyuria NEUROLOGICAL: No headache, dizziness, syncope, paralysis, ataxia, numbness or tingling in the extremities. No change in bowel or bladder  control.  MUSCULOSKELETAL: foot pain LYMPHATICS: No enlarged nodes. No history of splenectomy.  PSYCHIATRIC: No history of depression or anxiety.  ENDOCRINOLOGIC: No reports of sweating, cold or heat intolerance. No polyuria or polydipsia.  Marland Kitchen   Physical Examination Vitals:   03/27/17 1116  BP: (!) 144/98  Pulse: (!) 48  SpO2: 98%   Vitals:   03/27/17 1116  Weight: 247 lb (112 kg)  Height: 6\' 4"  (1.93 m)    Gen: resting comfortably, no acute distress HEENT: no scleral icterus, pupils equal round and  reactive, no palptable cervical adenopathy,  CV: RRR, no m//r,g no jvd Resp: Clear to auscultation bilaterally GI: abdomen is soft, non-tender, non-distended, normal bowel sounds, no hepatosplenomegaly MSK: extremities are warm, no edema.  Skin: warm, no rash Neuro:  no focal deficits Psych: appropriate affect   Diagnostic Studies 05/2001 Cath  FINDINGS:  1. Left main trunk: Medium caliber vessel. This is a long vessel with a  distal taper of 20-30%.  2. Left anterior descending artery: This is a medium caliber vessel that  provides a large bifurcating first diagonal Nathanal Hermiz in the proximal segment  and before then extending to the apex, the LAD tapers significantly after  the diagonal Mattye Verdone. There is moderate disease of 50-60% after the  diagonal Nely Dedmon and then a focal eccentric narrowing of 60-70% in the mid  section of the LAD. The diagonal Guhan Bruington has moderate disease of 30-40% in  the proximal segment. The lateral division is a small caliber vessel with  an ostial narrowing of 60%.  3. Left circumflex artery: This is a small caliber vessel that provides a  trivial first marginal Dwanda Tufano in the mid section and a medium caliber  second marginal Edwardine Deschepper distally. The mid AV circumflex at the takeoff of  the first and second diagonal branches had moderate diffuse disease of  60-70%.  4. Right coronary artery: Dominant. This is a large caliber vessel that   provides a posterior descending artery and two posteroventricular branches  in the terminal segment. The right coronary artery has evidence of an  existing stent in the mid section that is widely patent. Prior to stent is  a focal discrete narrowing of 40-50%. Distal to the stent is moderate  disease of 30%. The distal Augustino Savastano vessels also have mild disease of 30%.  5. Left ventricle: Normal end-systolic and end-diastolic dimensions. Overall  left ventricular function is well preserved. Ejection fraction is greater  than 65%. No mitral regurgitation. LV pressure is 180/10, aortic is  180/90. LV EDP equals 20.  ASSESSMENT AND PLAN: Mr. Bogden is a 66 year old gentleman with moderate  three-vessel coronary artery disease. The patients plaque burden in the left  anterior descending artery and circumflex appears to have increased somewhat  compared to his previous angiogram. Unfortunately, the mid and distal left  anterior descending artery is a small caliber vessel with a long diffuse  segment of disease and would be associated with a high restenosis rate.  Similarly, the circumflex would suffer the same fate.  Further assessment with a stress imaging study would be prudent to isolate the  area of ischemia. Percutaneous intervention may then be targeted if  indicated. In addition, aggressive medical therapy should be pursued as the  patient currently is not on any anti-anginal therapy and has poorly controlled  hypertension. The patient will also be counseled regarding smoking cessation.    07/2013 PFTs  Mild obstruction    10/2014 Lexiscan   Unable to adequately ambulate to treadmill due to right hip pain. Lexiscan employed.   Defect 1: There is a small defect of mild severity present in the mid inferior and apical inferior location. This defect is partially reversible.   This is a low risk study.   Nuclear stress EF: 58%.   Small region of apical  inferior ischemia    07/2015 Echo Study Conclusions  - Left ventricle: The cavity size was normal. Wall thickness was  increased increased in a pattern of mild to moderate LVH.  Systolic function was normal. The  estimated ejection fraction was  in the range of 60% to 65%. Diastolic function is abnormal,  indeterminate grade. Wall motion was normal; there were no  regional wall motion abnormalities. - Aortic valve: Mildly calcified annulus. Trileaflet; mildly  thickened leaflets. Valve area (VTI): 2.39 cm^2. Valve area  (Vmax): 2.58 cm^2. Valve area (Vmean): 2.7 cm^2. - Mitral valve: Mildly calcified annulus. Normal thickness leaflets  . - Technically adequate study.  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 Recommend followup by ultrasound in 2 years. This recommendation follows ACR consensus guidelines:     Assessment and Plan   1. HTN - home numbers at goal, continue current meds  2. AAA - [place] 08/2015 3.5 cm aneurysm. Needs repeat US 08/2017   3. CAD  - no symptoms, continue to moniotor.   4. Hyperlipidemia  - continue statin, request labs from pcp  5. Chronic diastolic HF - asymptoamtic and euvolemic, continue current meds  6. Preoperative evaluation - tolerates greater than 4 METs without limitation, recommend proceeding with surgyer.    7. Bradycardia - asymptomatic, likely related to beta blocker - monitor at this time, if neccesary lower dose over time.      Arnoldo Lenis, M.D.

## 2017-03-27 NOTE — Patient Instructions (Signed)

## 2017-03-27 NOTE — Telephone Encounter (Signed)
LM for [name] that pt was cleared for surgery - will send completed office notes when signed

## 2017-03-31 ENCOUNTER — Encounter: Payer: Self-pay | Admitting: Cardiology

## 2017-03-31 NOTE — Telephone Encounter (Signed)
Last office visit completed and faxed to podiatrist

## 2017-04-04 NOTE — Patient Instructions (Signed)
[name]  04/04/2017     @PREFPERIOPPHARMACY @   Your procedure is scheduled on  04/23/2017 .  Report to [facility] at  615  A.M.  Call this number if you have problems the morning of surgery:  [phone]   Remember:  Do not eat food or drink liquids after midnight.  Take these medicines the morning of surgery with A SIP OF WATER  Coreg, prilosec, altace.   Do not wear jewelry, make-up or nail polish.  Do not wear lotions, powders, or perfumes, or deoderant.  Do not shave 48 hours prior to surgery.  Men may shave face and neck.  Do not bring valuables to the hospital.  [facility] is not responsible for any belongings or valuables.  Contacts, dentures or bridgework may not be worn into surgery.  Leave your suitcase in the car.  After surgery it may be brought to your room.  For patients admitted to the hospital, discharge time will be determined by your treatment team.  Patients discharged the day of surgery will not be allowed to drive home.   Name and phone number of your driver:   family Special instructions:  None  Please read over the following fact sheets that you were given. Anesthesia Post-op Instructions and Care and Recovery After Surgery       Ostectomy of the Foot Ostectomy of the foot is a surgery to remove part of a bone in the foot. You may need this procedure if:  You have an abnormal bone growth called a bone spur (osteophyte) in your foot.  Bones in your foot are not lined up with each other (misalignment).  The procedure may be done if one of these conditions is causing pain or limiting movement. It is usually done when other treatments have not helped. During an ostectomy, part of the bone is removed to shorten or reshape the bone. Tell a health care provider about:  Any allergies you have.  All medicines you are taking, including vitamins, herbs, eye drops, creams, and over-the-counter medicines.  Any problems you or  family members have had with anesthetic medicines.  Any blood disorders you have.  Any surgeries you have had.  Any medical conditions you have.  Whether you are pregnant or may be pregnant. What are the risks? Generally, this is a safe procedure. However, problems may occur, including:  Infection.  Bleeding.  Pain.  Stiffness.  Numbness.  Allergic reactions to medicines.  Damage to nerves or blood vessels in the foot.  A blood clot that forms in the leg and travels to the lung.  Failure of the bone to heal.  The abnormal bone growth coming back again (recurrence).  What happens before the procedure? Staying hydrated Follow instructions from your health care provider about hydration, which may include:  Up to 2 hours before the procedure - you may continue to drink clear liquids, such as water, clear fruit juice, black coffee, and plain tea.  Eating and drinking restrictions Follow instructions from your health care provider about eating and drinking, which may include:  8 hours before the procedure - stop eating heavy meals or foods such as meat, fried foods, or fatty foods.  6 hours before the procedure - stop eating light meals or foods, such as toast or cereal.  6 hours before the procedure - stop drinking milk or drinks that contain milk.  2 hours before the procedure - stop  drinking clear liquids.  Medicines  Ask your health care provider about: ? Changing or stopping your regular medicines. This is especially important if you are taking diabetes medicines or blood thinners. ? Taking medicines such as aspirin and ibuprofen. These medicines can thin your blood. Do not take these medicines before your procedure if your health care provider instructs you not to.  You may be given antibiotic medicine to help prevent infection. General instructions  You may have foot X-rays done while you are standing on your foot. This allows the surgeon to clearly see the  abnormal bone growth in your foot.  Ask your health care provider how your surgical site will be marked or identified.  You may be asked to shower with a germ-killing soap.  Plan to have a responsible adult care for you for at least 24 hours after you leave the hospital or clinic. This is important. What happens during the procedure?  To lower your risk of infection: ? Your health care team will wash or sanitize their hands. ? Your skin will be washed with soap. ? Hair may be removed from the surgical area.  You will be given one or more of the following: ? A medicine to help you relax (sedative). ? A medicine to numb the area (local anesthetic). ? A medicine to make you fall asleep (general anesthetic). ? A medicine that is injected into your spine to numb the area below and slightly above the injection site (spinal anesthetic). ? A medicine that is injected into an area of your body to numb everything below the injection site (regional anesthetic).  An incision will be made in your foot.  Tissues, nerves, and blood vessels near the bone will be moved out of the way.  The spur or the part of bone that needs to be removed will be cut away using a bone saw or a bone shaving instrument.  The remaining bone may be secured with screws, wires, or plates.  The skin incision will be closed with stitches (sutures) or another type of skin closure.  A bandage (dressing) will be placed over the incision.  A soft wrap may be placed around your foot. The procedure may vary among health care providers and hospitals. What happens after the procedure?  Your blood pressure, heart rate, breathing rate, and blood oxygen level will be monitored until the medicines you were given have worn off.  Your foot may be placed in a protective shoe, a splint, or a cast.  You may be given crutches or a walker to help you walk without putting weight (bearing weight) on your foot.  Do not drive for 24  hours if you were given a sedative. Summary  Ostectomy of the foot is a surgery to remove part of a bone in the foot.  You may need this procedure if you have an abnormal bone growth called a bone spur (osteophyte) or if bones in your foot are not lined up with each other (misalignment). The procedure may be done if you have pain or limited movement and other treatments have not helped.  Follow instructions from your health care provider about eating and drinking before the procedure.  After the procedure, your foot may be placed in a protective shoe, a splint, or a cast. You may be given crutches or a walker to help you walk without putting weight (bearing weight) on your foot. This information is not intended to replace advice given to you by your health  care provider. Make sure you discuss any questions you have with your health care provider. Document Released: 06/25/2016 Document Revised: 06/25/2016 Document Reviewed: 06/25/2016 Elsevier Interactive Patient Education  2018 [place] After This sheet gives you information about how to care for yourself after your procedure. Your health care provider may also give you more specific instructions. If you have problems or questions, contact your health care provider. What can I expect after the procedure? After the procedure, it is common to have:  Pain.  Swelling.  Stiffness.  Follow these instructions at home: If you have a splint or supportive shoe:  Wear the splint or shoe as told by your health care provider. Remove it only as told by your health care provider.  Loosen the splint or shoe if your toes tingle, become numb, or turn cold and blue.  Keep the splint or shoe clean.  If the splint or shoe is not waterproof: ? Do not let it get wet. ? Cover it with a watertight covering when you take a bath or a shower. If you have a cast:  Do not stick anything inside the cast to scratch your  skin. Doing that increases your risk of infection.  Check the skin around the cast every day. Tell your health care provider about any concerns.  You may put lotion on dry skin around the edges of the cast. Do not put lotion on the skin underneath the cast.  Keep the cast clean.  If the cast is not waterproof: ? Do not let it get wet. ? Cover it with a watertight covering when you take a bath or a shower. Bathing  Do not take baths, swim, or use a hot tub until your health care provider approves. Ask your health care provider if you may take showers. You may only be allowed to take sponge baths for bathing.  If your cast or splint is not waterproof, cover it with a watertight covering when you take a bath or a shower.  Keep the bandage (dressing) dry until your health care provider says it can be removed. Incision care  Follow instructions from your health care provider about how to take care of your incision. Make sure you: ? Wash your hands with soap and water before you change your bandage (dressing). If soap and water are not available, use hand sanitizer. ? Change your dressing as told by your health care provider. ? Leave stitches (sutures), skin glue, or adhesive strips in place. These skin closures may need to stay in place for 2 weeks or longer. If adhesive strip edges start to loosen and curl up, you may trim the loose edges. Do not remove adhesive strips completely unless your health care provider tells you to do that.  Check your incision area every day for signs of infection. Check for: ? Redness, swelling, or pain. ? Fluid or blood. ? Warmth. ? Pus or a bad smell. Managing pain, stiffness, and swelling  If directed, put ice on the affected area. ? If you have a removable splint or shoe, remove it as told by your health care provider. ? Put ice in a plastic bag. ? Place a towel between your skin and the bag or between your cast and the bag. ? Leave the ice on for 20  minutes, 2-3 times a day.  Move your foot and toes often to avoid stiffness and to lessen swelling.  Raise (elevate) your foot area above the  level of your heart while you are sitting or lying down. Driving  Do not drive or use heavy machinery while taking prescription pain medicine.  Ask your health care provider when it is safe to drive if you have a cast, splint, or supportive shoe on your foot. Activity  Return to your normal activities as told by your health care provider. Ask your health care provider what activities are safe for you.  Do exercises as told by your health care provider. Safety  Do not use your foot to support your body weight until your health care provider says that you can.  Use your crutches or walker as told by your health care provider. General instructions  Do not put pressure on any part of the cast or splint until it is fully hardened. This may take several hours.  Do not use any products that contain nicotine or tobacco, such as cigarettes and e-cigarettes. These can delay bone healing. If you need help quitting, ask your health care provider.  Take over-the-counter and prescription medicines only as told by your health care provider.  Keep all follow-up visits as told by your health care provider. This is important. Contact a health care provider if:  You have chills or a fever.  Your pain is not controlled by your pain medicine.  You have redness, swelling, or pain around your incision.  You have fluid or blood coming from your incision.  Your incision feels warm to the touch.  You have pus or a bad smell coming from your incision. Get help right away if:  You have severe pain.  You have new pain, warmth, and swelling in your leg.  You have chest pain or difficulty breathing. Summary  After the procedure, it is common to have some pain, swelling, and stiffness.  Follow instructions from your health care provider about how to take  care of your incision. Check the incision area every day for signs of infection.  Do not use your foot to support your body weight until your health care provider says that you can.  Move your foot and toes often to avoid stiffness and to lessen swelling. This information is not intended to replace advice given to you by your health care provider. Make sure you discuss any questions you have with your health care provider. Document Released: 06/25/2016 Document Revised: 06/25/2016 Document Reviewed: 06/25/2016 Elsevier Interactive Patient Education  2018 [place] Anesthesia is a term that refers to techniques, procedures, and medicines that help a person stay safe and comfortable during a medical procedure. Monitored anesthesia care, or sedation, is one type of anesthesia. Your anesthesia specialist may recommend sedation if you will be having a procedure that does not require you to be unconscious, such as:  Cataract surgery.  A dental procedure.  A biopsy.  A colonoscopy.  During the procedure, you may receive a medicine to help you relax (sedative). There are three levels of sedation:  Mild sedation. At this level, you may feel awake and relaxed. You will be able to follow directions.  Moderate sedation. At this level, you will be sleepy. You may not remember the procedure.  Deep sedation. At this level, you will be asleep. You will not remember the procedure.  The more medicine you are given, the deeper your level of sedation will be. Depending on how you respond to the procedure, the anesthesia specialist may change your level of sedation or the type of anesthesia to fit  your needs. An anesthesia specialist will monitor you closely during the procedure. Let your health care provider know about:  Any allergies you have.  All medicines you are taking, including vitamins, herbs, eye drops, creams, and over-the-counter medicines.  Any use of  steroids (by mouth or as a cream).  Any problems you or family members have had with sedatives and anesthetic medicines.  Any blood disorders you have.  Any surgeries you have had.  Any medical conditions you have, such as sleep apnea.  Whether you are pregnant or may be pregnant.  Any use of cigarettes, alcohol, or street drugs. What are the risks? Generally, this is a safe procedure. However, problems may occur, including:  Getting too much medicine (oversedation).  Nausea.  Allergic reaction to medicines.  Trouble breathing. If this happens, a breathing tube may be used to help with breathing. It will be removed when you are awake and breathing on your own.  Heart trouble.  Lung trouble.  Before the procedure Staying hydrated Follow instructions from your health care provider about hydration, which may include:  Up to 2 hours before the procedure - you may continue to drink clear liquids, such as water, clear fruit juice, black coffee, and plain tea.  Eating and drinking restrictions Follow instructions from your health care provider about eating and drinking, which may include:  8 hours before the procedure - stop eating heavy meals or foods such as meat, fried foods, or fatty foods.  6 hours before the procedure - stop eating light meals or foods, such as toast or cereal.  6 hours before the procedure - stop drinking milk or drinks that contain milk.  2 hours before the procedure - stop drinking clear liquids.  Medicines Ask your health care provider about:  Changing or stopping your regular medicines. This is especially important if you are taking diabetes medicines or blood thinners.  Taking medicines such as aspirin and ibuprofen. These medicines can thin your blood. Do not take these medicines before your procedure if your health care provider instructs you not to.  Tests and exams  You will have a physical exam.  You may have blood tests done to  show: ? How well your kidneys and liver are working. ? How well your blood can clot.  General instructions  Plan to have someone take you home from the hospital or clinic.  If you will be going home right after the procedure, plan to have someone with you for 24 hours.  What happens during the procedure?  Your blood pressure, heart rate, breathing, level of pain and overall condition will be monitored.  An IV tube will be inserted into one of your veins.  Your anesthesia specialist will give you medicines as needed to keep you comfortable during the procedure. This may mean changing the level of sedation.  The procedure will be performed. After the procedure  Your blood pressure, heart rate, breathing rate, and blood oxygen level will be monitored until the medicines you were given have worn off.  Do not drive for 24 hours if you received a sedative.  You may: ? Feel sleepy, clumsy, or nauseous. ? Feel forgetful about what happened after the procedure. ? Have a sore throat if you had a breathing tube during the procedure. ? Vomit. This information is not intended to replace advice given to you by your health care provider. Make sure you discuss any questions you have with your health care provider. Document Released: 01/30/2005 Document  Revised: 10/13/2015 Document Reviewed: 08/27/2015 Elsevier Interactive Patient Education  2018 [place], [place] After These instructions provide you with information about caring for yourself after your procedure. Your health care provider may also give you more specific instructions. Your treatment has been planned according to current medical practices, but problems sometimes occur. Call your health care provider if you have any problems or questions after your procedure. What can I expect after the procedure? After your procedure, it is common to:  Feel sleepy for several hours.  Feel clumsy and have poor  balance for several hours.  Feel forgetful about what happened after the procedure.  Have poor judgment for several hours.  Feel nauseous or vomit.  Have a sore throat if you had a breathing tube during the procedure.  Follow these instructions at home: For at least 24 hours after the procedure:   Do not: ? Participate in activities in which you could fall or become injured. ? Drive. ? Use heavy machinery. ? Drink alcohol. ? Take sleeping pills or medicines that cause drowsiness. ? Make important decisions or sign legal documents. ? Take care of children on your own.  Rest. Eating and drinking  Follow the diet that is recommended by your health care provider.  If you vomit, drink water, juice, or soup when you can drink without vomiting.  Make sure you have little or no nausea before eating solid foods. General instructions  Have a responsible adult stay with you until you are awake and alert.  Take over-the-counter and prescription medicines only as told by your health care provider.  If you smoke, do not smoke without supervision.  Keep all follow-up visits as told by your health care provider. This is important. Contact a health care provider if:  You keep feeling nauseous or you keep vomiting.  You feel light-headed.  You develop a rash.  You have a fever. Get help right away if:  You have trouble breathing. This information is not intended to replace advice given to you by your health care provider. Make sure you discuss any questions you have with your health care provider. Document Released: 08/27/2015 Document Revised: 12/27/2015 Document Reviewed: 08/27/2015 Elsevier Interactive Patient Education  [employer].

## 2017-04-14 ENCOUNTER — Encounter (HOSPITAL_COMMUNITY): Payer: Self-pay

## 2017-04-14 ENCOUNTER — Ambulatory Visit (HOSPITAL_COMMUNITY)
Admission: RE | Admit: 2017-04-14 | Discharge: 2017-04-14 | Disposition: A | Discharge status: Home / Self Care | Payer: Medicare Other | Source: Ambulatory Visit | Attending: Podiatry | Admitting: Podiatry

## 2017-04-14 ENCOUNTER — Other Ambulatory Visit: Payer: Self-pay

## 2017-04-14 ENCOUNTER — Encounter (HOSPITAL_COMMUNITY)
Admission: RE | Admit: 2017-04-14 | Discharge: 2017-04-14 | Disposition: A | Discharge status: Home / Self Care | Payer: Medicare Other | Source: Ambulatory Visit | Attending: Podiatry | Admitting: Podiatry

## 2017-04-14 DIAGNOSIS — Z89422 Acquired absence of other left toe(s): Secondary | ICD-10-CM | POA: Diagnosis not present

## 2017-04-14 DIAGNOSIS — E1142 Type 2 diabetes mellitus with diabetic polyneuropathy: Secondary | ICD-10-CM | POA: Diagnosis not present

## 2017-04-14 DIAGNOSIS — L97522 Non-pressure chronic ulcer of other part of left foot with fat layer exposed: Secondary | ICD-10-CM

## 2017-04-14 DIAGNOSIS — M7989 Other specified soft tissue disorders: Secondary | ICD-10-CM | POA: Diagnosis not present

## 2017-04-14 DIAGNOSIS — Z981 Arthrodesis status: Secondary | ICD-10-CM | POA: Insufficient documentation

## 2017-04-14 DIAGNOSIS — M19072 Primary osteoarthritis, left ankle and foot: Secondary | ICD-10-CM | POA: Diagnosis not present

## 2017-04-14 LAB — CBC
HCT: 43.8 % (ref 39.0–52.0)
Hemoglobin: 14.4 g/dL (ref 13.0–17.0)
MCH: 32.3 pg (ref 26.0–34.0)
MCHC: 32.9 g/dL (ref 30.0–36.0)
MCV: 98.2 fL (ref 78.0–100.0)
Platelets: 239 10*3/uL (ref 150–400)
RBC: 4.46 MIL/uL (ref 4.22–5.81)
RDW: 15.1 % (ref 11.5–15.5)
WBC: 8.9 10*3/uL (ref 4.0–10.5)

## 2017-04-14 LAB — BASIC METABOLIC PANEL
Anion gap: 5 (ref 5–15)
BUN: 43 mg/dL — ABNORMAL HIGH (ref 6–20)
CO2: 26 mmol/L (ref 22–32)
Calcium: 8.4 mg/dL — ABNORMAL LOW (ref 8.9–10.3)
Chloride: 104 mmol/L (ref 101–111)
Creatinine, Ser: 2.6 mg/dL — ABNORMAL HIGH (ref 0.61–1.24)
GFR calc Af Amer: 28 mL/min — ABNORMAL LOW (ref >60)
GFR calc non Af Amer: 24 mL/min — ABNORMAL LOW (ref >60)
Glucose, Bld: 153 mg/dL — ABNORMAL HIGH (ref 65–99)
Potassium: 3.6 mmol/L (ref 3.5–5.1)
Sodium: 135 mmol/L (ref 135–145)

## 2017-04-14 LAB — HEMOGLOBIN A1C
Hgb A1c MFr Bld: 5.8 % — ABNORMAL HIGH (ref 4.8–5.6)
Mean Plasma Glucose: 119.76 mg/dL

## 2017-04-14 NOTE — Pre-Procedure Instructions (Signed)
HgbA1C routed to PCP. 

## 2017-04-15 NOTE — Pre-Procedure Instructions (Signed)
Dr Patsey Berthold aware of renal history and labs. Will hang 0.9% NS on arrival when starting IV.

## 2017-04-23 ENCOUNTER — Telehealth: Payer: Self-pay

## 2017-04-23 ENCOUNTER — Ambulatory Visit (HOSPITAL_COMMUNITY): Payer: Medicare Other | Admitting: Anesthesiology

## 2017-04-23 ENCOUNTER — Ambulatory Visit (HOSPITAL_COMMUNITY)
Admission: RE | Admit: 2017-04-23 | Discharge: 2017-04-23 | Disposition: A | Discharge status: Home / Self Care | Payer: Medicare Other | Source: Ambulatory Visit | Attending: Podiatry | Admitting: Podiatry

## 2017-04-23 ENCOUNTER — Encounter (HOSPITAL_COMMUNITY): Payer: Self-pay | Admitting: *Deleted

## 2017-04-23 ENCOUNTER — Encounter (HOSPITAL_COMMUNITY)
Admission: RE | Disposition: A | Discharge status: Home / Self Care | Payer: Self-pay | Source: Ambulatory Visit | Attending: Podiatry

## 2017-04-23 ENCOUNTER — Ambulatory Visit (HOSPITAL_COMMUNITY): Payer: Medicare Other

## 2017-04-23 DIAGNOSIS — E1122 Type 2 diabetes mellitus with diabetic chronic kidney disease: Secondary | ICD-10-CM | POA: Insufficient documentation

## 2017-04-23 DIAGNOSIS — F17210 Nicotine dependence, cigarettes, uncomplicated: Secondary | ICD-10-CM | POA: Diagnosis not present

## 2017-04-23 DIAGNOSIS — E1151 Type 2 diabetes mellitus with diabetic peripheral angiopathy without gangrene: Secondary | ICD-10-CM | POA: Insufficient documentation

## 2017-04-23 DIAGNOSIS — L97529 Non-pressure chronic ulcer of other part of left foot with unspecified severity: Secondary | ICD-10-CM | POA: Insufficient documentation

## 2017-04-23 DIAGNOSIS — I130 Hypertensive heart and chronic kidney disease with heart failure and stage 1 through stage 4 chronic kidney disease, or unspecified chronic kidney disease: Secondary | ICD-10-CM | POA: Insufficient documentation

## 2017-04-23 DIAGNOSIS — Z79899 Other long term (current) drug therapy: Secondary | ICD-10-CM | POA: Diagnosis not present

## 2017-04-23 DIAGNOSIS — L97526 Non-pressure chronic ulcer of other part of left foot with bone involvement without evidence of necrosis: Secondary | ICD-10-CM | POA: Diagnosis not present

## 2017-04-23 DIAGNOSIS — Z9889 Other specified postprocedural states: Secondary | ICD-10-CM

## 2017-04-23 DIAGNOSIS — E1140 Type 2 diabetes mellitus with diabetic neuropathy, unspecified: Secondary | ICD-10-CM | POA: Diagnosis not present

## 2017-04-23 DIAGNOSIS — E11621 Type 2 diabetes mellitus with foot ulcer: Secondary | ICD-10-CM | POA: Insufficient documentation

## 2017-04-23 DIAGNOSIS — K210 Gastro-esophageal reflux disease with esophagitis: Secondary | ICD-10-CM | POA: Insufficient documentation

## 2017-04-23 DIAGNOSIS — I714 Abdominal aortic aneurysm, without rupture: Secondary | ICD-10-CM | POA: Diagnosis not present

## 2017-04-23 DIAGNOSIS — Z7982 Long term (current) use of aspirin: Secondary | ICD-10-CM | POA: Diagnosis not present

## 2017-04-23 DIAGNOSIS — M898X7 Other specified disorders of bone, ankle and foot: Secondary | ICD-10-CM | POA: Diagnosis not present

## 2017-04-23 DIAGNOSIS — I2510 Atherosclerotic heart disease of native coronary artery without angina pectoris: Secondary | ICD-10-CM | POA: Insufficient documentation

## 2017-04-23 DIAGNOSIS — G4730 Sleep apnea, unspecified: Secondary | ICD-10-CM | POA: Diagnosis not present

## 2017-04-23 DIAGNOSIS — J449 Chronic obstructive pulmonary disease, unspecified: Secondary | ICD-10-CM | POA: Insufficient documentation

## 2017-04-23 DIAGNOSIS — M19072 Primary osteoarthritis, left ankle and foot: Secondary | ICD-10-CM | POA: Diagnosis not present

## 2017-04-23 DIAGNOSIS — L97522 Non-pressure chronic ulcer of other part of left foot with fat layer exposed: Secondary | ICD-10-CM | POA: Diagnosis not present

## 2017-04-23 DIAGNOSIS — I5032 Chronic diastolic (congestive) heart failure: Secondary | ICD-10-CM | POA: Diagnosis not present

## 2017-04-23 DIAGNOSIS — Q742 Other congenital malformations of lower limb(s), including pelvic girdle: Secondary | ICD-10-CM | POA: Insufficient documentation

## 2017-04-23 DIAGNOSIS — Z951 Presence of aortocoronary bypass graft: Secondary | ICD-10-CM | POA: Insufficient documentation

## 2017-04-23 DIAGNOSIS — Z794 Long term (current) use of insulin: Secondary | ICD-10-CM | POA: Diagnosis not present

## 2017-04-23 DIAGNOSIS — N189 Chronic kidney disease, unspecified: Secondary | ICD-10-CM | POA: Diagnosis not present

## 2017-04-23 DIAGNOSIS — E785 Hyperlipidemia, unspecified: Secondary | ICD-10-CM | POA: Diagnosis not present

## 2017-04-23 DIAGNOSIS — E1142 Type 2 diabetes mellitus with diabetic polyneuropathy: Secondary | ICD-10-CM | POA: Diagnosis not present

## 2017-04-23 HISTORY — PX: OSTECTOMY: SHX6439

## 2017-04-23 LAB — GLUCOSE, CAPILLARY
Glucose-Capillary: 142 mg/dL — ABNORMAL HIGH (ref 65–99)
Glucose-Capillary: 144 mg/dL — ABNORMAL HIGH (ref 65–99)

## 2017-04-23 SURGERY — OSTECTOMY
Anesthesia: Monitor Anesthesia Care | Site: First Toe | Laterality: Left

## 2017-04-23 MED ORDER — BUPIVACAINE HCL (PF) 0.5 % IJ SOLN
INTRAMUSCULAR | Status: AC
  Filled 2017-04-23: qty 30

## 2017-04-23 MED ORDER — FENTANYL CITRATE (PF) 100 MCG/2ML IJ SOLN
INTRAMUSCULAR | Status: DC | PRN
  Administered 2017-04-23: 25 ug via INTRAVENOUS

## 2017-04-23 MED ORDER — MIDAZOLAM HCL 2 MG/2ML IJ SOLN
INTRAMUSCULAR | Status: AC
  Filled 2017-04-23: qty 2

## 2017-04-23 MED ORDER — SODIUM CHLORIDE 0.9 % IR SOLN
Status: DC | PRN
  Administered 2017-04-23: 1000 mL

## 2017-04-23 MED ORDER — PROPOFOL 500 MG/50ML IV EMUL
INTRAVENOUS | Status: DC | PRN
  Administered 2017-04-23: 50 ug/kg/min via INTRAVENOUS

## 2017-04-23 MED ORDER — FENTANYL CITRATE (PF) 100 MCG/2ML IJ SOLN
INTRAMUSCULAR | Status: AC
  Filled 2017-04-23: qty 2

## 2017-04-23 MED ORDER — CHLORHEXIDINE GLUCONATE CLOTH 2 % EX PADS: 6.0000 | MEDICATED_PAD | Freq: Once | CUTANEOUS | Status: DC

## 2017-04-23 MED ORDER — MIDAZOLAM HCL 5 MG/5ML IJ SOLN
INTRAMUSCULAR | Status: DC | PRN
  Administered 2017-04-23 (×2): 1 mg via INTRAVENOUS

## 2017-04-23 MED ORDER — CEFAZOLIN SODIUM-DEXTROSE 2-4 GM/100ML-% IV SOLN
2.0000 g | INTRAVENOUS | Status: AC
  Administered 2017-04-23: 2 g via INTRAVENOUS
  Filled 2017-04-23: qty 100

## 2017-04-23 MED ORDER — SODIUM CHLORIDE 0.9 % IV SOLN
INTRAVENOUS | Status: DC
  Administered 2017-04-23: 07:00:00 via INTRAVENOUS

## 2017-04-23 MED ORDER — MIDAZOLAM HCL 2 MG/2ML IJ SOLN
1.0000 mg | Freq: Once | INTRAMUSCULAR | Status: AC | PRN
  Administered 2017-04-23: 2 mg via INTRAVENOUS
  Filled 2017-04-23: qty 2

## 2017-04-23 MED ORDER — ONDANSETRON 4 MG PO TBDP
4.0000 mg | ORAL_TABLET | Freq: Once | ORAL | Status: AC
  Administered 2017-04-23: 4 mg via ORAL
  Filled 2017-04-23: qty 1

## 2017-04-23 MED ORDER — BUPIVACAINE HCL (PF) 0.5 % IJ SOLN
INTRAMUSCULAR | Status: DC | PRN
  Administered 2017-04-23: 20 mL

## 2017-04-23 MED ORDER — EPHEDRINE SULFATE 50 MG/ML IJ SOLN
INTRAMUSCULAR | Status: AC
  Filled 2017-04-23: qty 1

## 2017-04-23 MED ORDER — SODIUM CHLORIDE 0.9 % IJ SOLN
INTRAMUSCULAR | Status: AC
  Filled 2017-04-23: qty 10

## 2017-04-23 MED ORDER — PROPOFOL 10 MG/ML IV BOLUS
INTRAVENOUS | Status: AC
  Filled 2017-04-23: qty 40

## 2017-04-23 MED ORDER — OXYCODONE HCL 5 MG/5ML PO SOLN: 5.0000 mg | Freq: Once | ORAL | Status: DC | PRN

## 2017-04-23 MED ORDER — SUCCINYLCHOLINE CHLORIDE 20 MG/ML IJ SOLN
INTRAMUSCULAR | Status: AC
  Filled 2017-04-23: qty 1

## 2017-04-23 MED ORDER — LIDOCAINE HCL (PF) 1 % IJ SOLN
INTRAMUSCULAR | Status: AC
  Filled 2017-04-23: qty 30

## 2017-04-23 MED ORDER — OXYCODONE HCL 5 MG PO TABS: 5.0000 mg | ORAL_TABLET | Freq: Once | ORAL | Status: DC | PRN

## 2017-04-23 MED ORDER — APIXABAN 5 MG PO TABS: 5.0000 mg | ORAL_TABLET | Freq: Two times a day (BID) | ORAL | 3 refills | Status: DC

## 2017-04-23 MED ORDER — LIDOCAINE HCL (PF) 1 % IJ SOLN
INTRAMUSCULAR | Status: AC
  Filled 2017-04-23: qty 5

## 2017-04-23 SURGICAL SUPPLY — 46 items
APL SKNCLS STERI-STRIP NONHPOA (GAUZE/BANDAGES/DRESSINGS) ×1
BAG HAMPER (MISCELLANEOUS) ×2 IMPLANT
BANDAGE ELASTIC 4 LF NS (GAUZE/BANDAGES/DRESSINGS) ×1 IMPLANT
BANDAGE ELASTIC 4 VELCRO NS (GAUZE/BANDAGES/DRESSINGS) ×2 IMPLANT
BANDAGE ESMARK 4X12 BL STRL LF (DISPOSABLE) ×1 IMPLANT
BENZOIN TINCTURE PRP APPL 2/3 (GAUZE/BANDAGES/DRESSINGS) ×2 IMPLANT
BLADE 15 SAFETY STRL DISP (BLADE) ×4 IMPLANT
BLADE AVERAGE 25X9 (BLADE) ×2 IMPLANT
BNDG CMPR 12X4 ELC STRL LF (DISPOSABLE) ×1
BNDG CMPR MED 5X4 ELC HKLP NS (GAUZE/BANDAGES/DRESSINGS) ×1
BNDG CONFORM 2 STRL LF (GAUZE/BANDAGES/DRESSINGS) ×2 IMPLANT
BNDG ESMARK 4X12 BLUE STRL LF (DISPOSABLE) ×2
BNDG GAUZE ELAST 4 BULKY (GAUZE/BANDAGES/DRESSINGS) ×2 IMPLANT
CHLORAPREP W/TINT 26ML (MISCELLANEOUS) ×2 IMPLANT
CLOTH BEACON ORANGE TIMEOUT ST (SAFETY) ×2 IMPLANT
COVER LIGHT HANDLE STERIS (MISCELLANEOUS) ×4 IMPLANT
CUFF TOURNIQUET SINGLE 18IN (TOURNIQUET CUFF) ×1 IMPLANT
DECANTER SPIKE VIAL GLASS SM (MISCELLANEOUS) ×2 IMPLANT
DRAPE OEC MINIVIEW 54X84 (DRAPES) ×2 IMPLANT
DRSG ADAPTIC 3X8 NADH LF (GAUZE/BANDAGES/DRESSINGS) ×2 IMPLANT
ELECT REM PT RETURN 9FT ADLT (ELECTROSURGICAL) ×2
ELECTRODE REM PT RTRN 9FT ADLT (ELECTROSURGICAL) ×1 IMPLANT
GAUZE SPONGE 4X4 12PLY STRL (GAUZE/BANDAGES/DRESSINGS) ×2 IMPLANT
GLOVE BIO SURGEON STRL SZ7 (GLOVE) ×1 IMPLANT
GLOVE BIO SURGEON STRL SZ7.5 (GLOVE) ×2 IMPLANT
GLOVE BIOGEL PI IND STRL 7.0 (GLOVE) ×1 IMPLANT
GLOVE BIOGEL PI INDICATOR 7.0 (GLOVE) ×2
GLOVE ECLIPSE 6.5 STRL STRAW (GLOVE) ×1 IMPLANT
GOWN STRL REUS W/TWL LRG LVL3 (GOWN DISPOSABLE) ×6 IMPLANT
KIT ROOM TURNOVER AP CYSTO (KITS) ×2 IMPLANT
MANIFOLD NEPTUNE II (INSTRUMENTS) ×2 IMPLANT
NDL HYPO 27GX1-1/4 (NEEDLE) ×3 IMPLANT
NEEDLE HYPO 27GX1-1/4 (NEEDLE) ×6 IMPLANT
NS IRRIG 1000ML POUR BTL (IV SOLUTION) ×2 IMPLANT
PACK BASIC LIMB (CUSTOM PROCEDURE TRAY) ×2 IMPLANT
PAD ARMBOARD 7.5X6 YLW CONV (MISCELLANEOUS) ×2 IMPLANT
RASP SM TEAR CROSS CUT (RASP) ×1 IMPLANT
SET BASIN LINEN APH (SET/KITS/TRAYS/PACK) ×2 IMPLANT
SPONGE LAP 18X18 X RAY DECT (DISPOSABLE) ×2 IMPLANT
STRIP CLOSURE SKIN 1/2X4 (GAUZE/BANDAGES/DRESSINGS) ×3 IMPLANT
SUT ETHILON 4 0 PS 2 18 (SUTURE) ×1 IMPLANT
SUT PROLENE 4 0 PS 2 18 (SUTURE) ×2 IMPLANT
SUT VIC AB 2-0 CT2 27 (SUTURE) ×2 IMPLANT
SUT VIC AB 4-0 PS2 27 (SUTURE) ×2 IMPLANT
SUT VICRYL AB 3-0 FS1 BRD 27IN (SUTURE) ×2 IMPLANT
SYR CONTROL 10ML LL (SYRINGE) ×4 IMPLANT

## 2017-04-23 NOTE — Anesthesia Preprocedure Evaluation (Signed)
Anesthesia Evaluation  Patient identified by MRN, date of birth, ID band Patient awake    Airway Mallampati: I  TM Distance: >3 FB Neck ROM: Full    Dental  (+) Teeth Intact   Pulmonary sleep apnea , pneumonia, resolved, COPD,  COPD inhaler, Current Smoker,    Pulmonary exam normal        Cardiovascular Exercise Tolerance: Poor hypertension, Pt. on medications and Pt. on home beta blockers + CAD and + Peripheral Vascular Disease  Normal cardiovascular exam Rhythm:Regular Rate:Normal    Systolic function was normal. The estimated ejection fraction was   in the range of 60% to 65%. Diastolic function is abnormal,   indeterminate grade.  (2017)  Marked sinus  Bradycardia  - occasional PAC     -Nonspecific ST depression   +   T-abnormality  -Nondiagnostic  -Possible  Anterolateral  ischemia.   (2018)   Neuro/Psych    GI/Hepatic   Endo/Other  diabetes, Well Controlled, Type 1Results for Albert, Paul (MRN [id number]) as of 04/23/2017 06:33  04/14/2017 13:59 Hemoglobin A1C: 5.8 (H)   Renal/GU CRFRenal diseaseResults for Albert, Paul (MRN [id number]) as of 04/23/2017 06:33  04/14/2017 13:59 BUN: 43 (H) Creatinine: 2.60 (H)      Musculoskeletal   Abdominal Normal abdominal exam  (+)   Peds  Hematology   Anesthesia Other Findings   Reproductive/Obstetrics                             Anesthesia Physical Anesthesia Plan  ASA: IV  Anesthesia Plan: MAC   Post-op Pain Management:    Induction:   PONV Risk Score and Plan:   Airway Management Planned: Nasal Cannula  Additional Equipment:   Intra-op Plan:   Post-operative Plan:   Informed Consent: I have reviewed the patients History and Physical, chart, labs and discussed the procedure including the risks, benefits and alternatives for the proposed anesthesia with the patient or authorized representative who has indicated his/her  understanding and acceptance.   Dental advisory given  Plan Discussed with: CRNA  Anesthesia Plan Comments:         Anesthesia Quick Evaluation

## 2017-04-23 NOTE — Discharge Instructions (Signed)
These instructions will give you an idea of what to expect after surgery and how to manage issues that may arise before your first post op office visit.  Pain Management Pain is best managed by staying ahead of it. If pain gets out of control, it is difficult to get it back under control. Local anesthesia that lasts 6-8 hours is used to numb the foot and decrease pain.  For the best pain control, take the pain medication every 4 hours for the first 2 days post op. On the third day pain medication can be taken as needed.   Post Op Nausea Nausea is common after surgery, so it is managed proactively.  If prescribed, use the prescribed nausea medication regularly for the first 2 days post op.  Bandages Do not worry if there is blood on the bandage. What looks like a lot of blood on the bandage is actually a small amount. Blood on the dressing spreads out as it is absorbed by the gauze, the same way a drop of water spreads out on a paper towel.  If the bandages feel wet or dry, stiff and uncomfortable, call the office during office hours and we will schedule a time for you to have the bandage changed.  Unless you are specifically told otherwise, we will do the first bandage change in the office.  Keep your bandage dry. If the bandage becomes wet or soiled, notify the office and we will schedule a time to change the bandage.  Activity It is best to spend most of the first 2 days after surgery lying down with the foot elevated above the level of your heart. You may put weight on your heel while wearing the surgical shoe.   You may only get up to go to the restroom.  Driving Do not drive until you are able to respond in an emergency (i.e. slam on the brakes). This usually occurs after the bone has healed - 6 to 8 weeks.  Call the Office If you have a fever over 101F.  If you have increasing pain after the initial post op pain has settled down.  If you have increasing redness, swelling, or  drainage.  If you have any questions or concerns.     PATIENT INSTRUCTIONS POST-ANESTHESIA  IMMEDIATELY FOLLOWING SURGERY:  Do not drive or operate machinery for the first twenty four hours after surgery.  Do not make any important decisions for twenty four hours after surgery or while taking narcotic pain medications or sedatives.  If you develop intractable nausea and vomiting or a severe headache please notify your doctor immediately.  FOLLOW-UP:  Please make an appointment with your surgeon as instructed. You do not need to follow up with anesthesia unless specifically instructed to do so.  WOUND CARE INSTRUCTIONS (if applicable):  Keep a dry clean dressing on the anesthesia/puncture wound site if there is drainage.  Once the wound has quit draining you may leave it open to air.  Generally you should leave the bandage intact for twenty four hours unless there is drainage.  If the epidural site drains for more than 36-48 hours please call the anesthesia department.  QUESTIONS?:  Please feel free to call your physician or the hospital operator if you have any questions, and they will be happy to assist you.      Atrial Flutter Atrial flutter is a type of abnormal heart rhythm (arrhythmia). In atrial flutter, the heartbeat is fast but regular. There are two types of  atrial flutter:  Paroxysmal atrial flutter. This type starts suddenly. It usually stops on its own soon after it starts.  Permanent atrial flutter. This type does not go away.  What are the causes? This condition may be caused by:  A heart condition or problem, such as: ? A heart attack. ? Heart failure. ? A heart valve problem.  A lung problem, such as: ? A blood clot in the lungs (pulmonary embolism, or PE). ? Chronic obstructive pulmonary disease.  Poorly controlled high blood pressure (hypertension).  Hyperthyroidism.  Caffeine.  Some decongestant cold medicines.  Low levels of minerals called electrolytes  in the blood.  Cocaine.  What increases the risk? This condition is more likely to develop in:  Elderly adults.  Men.  What are the signs or symptoms? Symptoms of this condition include:  A feeling that your heart is pounding or racing (palpitations).  Shortness of breath.  Chest pain.  Feeling light-headed.  Dizziness.  Fainting.  How is this diagnosed? This condition may be diagnosed with tests, including:  An electrocardiogram (ECG). This is a painless test that records electrical signals in the heart.  Holter monitoring. For this test, you wear a device that records your heartbeat for 1-2 days.  Cardiac event monitoring. For this test, you wear a device that records your heartbeat for up to 30 days.  An echocardiogram. This is a painless test that uses sound waves to make a picture of your heart.  Stress test. This test records your heartbeat while you exercise.  Blood tests.  How is this treated? This condition may be treated with:  Treatment of any underlying conditions.  Medicine to make your heart beat more slowly.  Medicine to keep the condition from coming back.  A procedure to keep the condition under control. Some procedures to do this include: ? Cardioversion. During this procedure, medicines or an electrical shock are given to make the heart beat normally. ? Ablation. During this procedure, the heart tissue that is causing the problem is destroyed. This procedure may be done if atrial flutter lasts a long time or happens often.  Follow these instructions at home:  Take over-the-counter and prescription medicines only as told by your health care provider.  Do not take any new medicines without talking to your health care provider.  Do not use tobacco products, including cigarettes, chewing tobacco, or e-cigarettes. If you need help quitting, ask your health care provider.  Limit alcohol intake to no more than 1 drink per day for nonpregnant  women and 2 drinks per day for men. One drink equals 12 oz of beer, 5 oz of wine, or 1 oz of hard liquor.  Try to reduce any stress. Stress can make your symptoms worse. Contact a health care provider if:  Your symptoms get worse. Get help right away if:  You are dizzy.  You feel like fainting or you faint.  You have shortness of breath.  You feel pain or pressure in your chest.  You suddenly feel nauseous or you suddenly vomit.  There is a sudden change in your ability to speak, eat, or move.  You are sweating a lot for no reason. This information is not intended to replace advice given to you by your health care provider. Make sure you discuss any questions you have with your health care provider. Document Released: 09/22/2008 Document Revised: 09/13/2015 Document Reviewed: 11/18/2014 Elsevier Interactive Patient Education  2018 [employer].  Apixaban oral tablets What is this  medicine? APIXABAN (a PIX a ban) is an anticoagulant (blood thinner). It is used to lower the chance of stroke in people with a medical condition called atrial fibrillation. It is also used to treat or prevent blood clots in the lungs or in the veins. This medicine may be used for other purposes; ask your health care provider or pharmacist if you have questions. COMMON BRAND NAME(S): Eliquis What should I tell my health care provider before I take this medicine? They need to know if you have any of these conditions: -bleeding disorders -bleeding in the brain -blood in your stools (black or tarry stools) or if you have blood in your vomit -history of stomach bleeding -kidney disease -liver disease -mechanical heart valve -an unusual or allergic reaction to apixaban, other medicines, foods, dyes, or preservatives -pregnant or trying to get pregnant -breast-feeding How should I use this medicine? Take this medicine by mouth with a glass of water. Follow the directions on the prescription label. You  can take it with or without food. If it upsets your stomach, take it with food. Take your medicine at regular intervals. Do not take it more often than directed. Do not stop taking except on your doctor's advice. Stopping this medicine may increase your risk of a blot clot. Be sure to refill your prescription before you run out of medicine. Talk to your pediatrician regarding the use of this medicine in children. Special care may be needed. Overdosage: If you think you have taken too much of this medicine contact a poison control center or emergency room at once. NOTE: This medicine is only for you. Do not share this medicine with others. What if I miss a dose? If you miss a dose, take it as soon as you can. If it is almost time for your next dose, take only that dose. Do not take double or extra doses. What may interact with this medicine? This medicine may interact with the following: -aspirin and aspirin-like medicines -certain medicines for fungal infections like ketoconazole and itraconazole -certain medicines for seizures like carbamazepine and phenytoin -certain medicines that treat or prevent blood clots like warfarin, enoxaparin, and dalteparin -clarithromycin -NSAIDs, medicines for pain and inflammation, like ibuprofen or naproxen -rifampin -ritonavir -St. John's wort This list may not describe all possible interactions. Give your health care provider a list of all the medicines, herbs, non-prescription drugs, or dietary supplements you use. Also tell them if you smoke, drink alcohol, or use illegal drugs. Some items may interact with your medicine. What should I watch for while using this medicine? Visit your doctor or health care professional for regular checks on your progress. Notify your doctor or health care professional and seek emergency treatment if you develop breathing problems; changes in vision; chest pain; severe, sudden headache; pain, swelling, warmth in the leg; trouble  speaking; sudden numbness or weakness of the face, arm or leg. These can be signs that your condition has gotten worse. If you are going to have surgery or other procedure, tell your doctor that you are taking this medicine. What side effects may I notice from receiving this medicine? Side effects that you should report to your doctor or health care professional as soon as possible: -allergic reactions like skin rash, itching or hives, swelling of the face, lips, or tongue -signs and symptoms of bleeding such as bloody or black, tarry stools; red or dark-brown urine; spitting up blood or brown material that looks like coffee grounds; red spots on the  skin; unusual bruising or bleeding from the eye, gums, or nose This list may not describe all possible side effects. Call your doctor for medical advice about side effects. You may report side effects to FDA at 1-800-FDA-1088. Where should I keep my medicine? Keep out of the reach of children. Store at room temperature between 20 and 25 degrees C (68 and 77 degrees F). Throw away any unused medicine after the expiration date. NOTE: This sheet is a summary. It may not cover all possible information. If you have questions about this medicine, talk to your doctor, pharmacist, or health care provider.  2018 Elsevier/Gold Standard (2015-11-27 11:54:23)

## 2017-04-23 NOTE — Brief Op Note (Signed)
BRIEF OPERATIVE NOTE  DATE OF PROCEDURE 04/23/2017  SURGEON Marcheta Grammes, DPM  ASSISTANT SURGEON Jilda Panda, DPM  OR STAFF Circulator: Cox, Gershon Mussel, RN Scrub Person: Lucie Leather, CST Circulator Assistant: Glory Rosebush, RN   PREOPERATIVE DIAGNOSIS 1.  Chronic ulceration of left great toe 2.  Diabetes mellitus with peripheral neuropathy  POSTOPERATIVE DIAGNOSIS 1.  Chronic ulceration of left great toe 2.  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  Diabetes mellitus with peripheral neuropathy  PROCEDURE 1.  Ostectomy of left great toe 2.  Excision of accessory ossicle of IPJ of left great toe  ANESTHESIA Monitor Anesthesia Care   HEMOSTASIS Pneumatic ankle tourniquet set at 250 mmHg  ESTIMATED BLOOD LOSS Minimal (<5 cc)  MATERIALS USED None  INJECTABLES 0.5% Marcaine plain  PATHOLOGY Bone of left great toe  COMPLICATIONS None

## 2017-04-23 NOTE — Progress Notes (Signed)
Patient arrived to PACU in new onset Atrial Flutter.  EKG confirmed rhythm.  Dr Delena Bali made aware of changes and assessed patient.  Patient denied any symptoms.  Dr Bronson Ing consulted and called in Eliquis for patient and will call him with follow up appointment.  Patient and wife educated on Eliquis and Atrial Flutter.  Written information provided.  Advised to pick up Eliquis prescription on the way home.  Dr [name] made aware of above and was agreeable to patient starting Eliquis tonight, as prescribed.

## 2017-04-23 NOTE — Op Note (Signed)
OPERATIVE NOTE  DATE OF PROCEDURE 04/23/2017  DATE OF PROCEDURE 04/23/2017  SURGEON Marcheta Grammes, DPM  ASSISTANT SURGEON Jilda Panda, DPM  OR STAFF Circulator: Cox, Gershon Mussel, RN Scrub Person: Lucie Leather, CST Circulator Assistant: Glory Rosebush, RN   PREOPERATIVE DIAGNOSIS 1.  Chronic ulceration of left great toe 2.  Diabetes mellitus with peripheral neuropathy  POSTOPERATIVE DIAGNOSIS 1.  Chronic ulceration of left great toe 2.  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  Diabetes mellitus with peripheral neuropathy  PROCEDURE 1.  Ostectomy of left great toe 2.  Excision of accessory ossicle of IPJ of left great toe  ANESTHESIA Monitor Anesthesia Care   HEMOSTASIS Pneumatic ankle tourniquet set at 250 mmHg  ESTIMATED BLOOD LOSS Minimal (<5 cc)  MATERIALS USED None  INJECTABLES 0.5% Marcaine plain  PATHOLOGY Bone of left great toe  COMPLICATIONS None  INDICATIONS:  Chronic recurrent ulceration of the left great toe.  DESCRIPTION OF THE PROCEDURE:  The patient was brought to the operating room and placed on the operative table in the supine position.  A pneumatic ankle tourniquet was applied to the operative extremity.  Following sedation, the surgical site was anesthetized with 0.5% Marcaine plain.  The foot was then prepped, scrubbed, and draped in the usual sterile technique.  The foot was elevated, exsanguinated and the pneumatic ankle tourniquet inflated to 250 mmHg.    Attention was directed to the medial aspect of the left hallux.  A linear longitudinal incision was made.  Dissection was continued deep down to the level of the proximal phalanx.  A periosteal incision was performed.  The periosteum was reflected.  A portion of the plantar aspect of the proximal phalanx was resected using a power saw.  The medial prominence of the head of the proximal phalanx was resected using a power bone saw.  An accessory ossicle was identified.  It  was freed of all soft tissue attachments removed and passed from the operative field.  The specimen was sent to pathology for evaluation.  All bone edges were smoothed with a power rasp.  The wound was irrigated with amounts of sterile irrigant.  The subcutaneous structures were reapproximated using 4-0 Vicryl.  The skin was reapproximated using 4-0 Nylon.  A sterile compressive dressing was applied to the left foot.  The pneumatic ankle tourniquet was deflated.  A prompt hyperemic response was noted to all digits of the left foot.    The patient tolerated the procedure well.  The patient was then transferred to PACU with vital signs stable and vascular status intact to all toes of the operative foot.

## 2017-04-23 NOTE — Anesthesia Postprocedure Evaluation (Signed)
Anesthesia Post Note  Patient: [name]  Procedure(s) Performed: OSTECTOMY LEFT GREAT TOE (Left First Toe)  Anesthesia Type: MAC     Last Vitals:  Vitals:   04/23/17 0900 04/23/17 0905  BP: 113/66 (!) 157/85  Pulse: 64 68  Resp: 15   Temp:  (!) 36.4 C  SpO2: 91% 94%    Last Pain:  Vitals:   04/23/17 0905  TempSrc: Oral       patient seen on arrival, "new" onset atrial flutter, No CP/SOB, patient unaware of rhythm.  Consulted Cardiology, will f/u          [name]

## 2017-04-23 NOTE — Anesthesia Postprocedure Evaluation (Signed)
Anesthesia Post Note  Patient: [name]  Procedure(s) Performed: OSTECTOMY LEFT GREAT TOE (Left First Toe)  Patient location during evaluation: PACU Anesthesia Type: MAC Level of consciousness: awake and alert and patient cooperative Pain management: pain level controlled Vital Signs Assessment: post-procedure vital signs reviewed and stable Respiratory status: spontaneous breathing, nonlabored ventilation and respiratory function stable Cardiovascular status: blood pressure returned to baseline Postop Assessment: no apparent nausea or vomiting Anesthetic complications: no     Last Vitals:  Vitals:   04/23/17 0715 04/23/17 0822  BP:    Pulse:    Resp: 19   Temp:  36.5 C  SpO2: 95%     Last Pain: There were no vitals filed for this visit.               Sani Loiseau J

## 2017-04-23 NOTE — Anesthesia Postprocedure Evaluation (Signed)
Anesthesia Post Note  Patient: [name]  Procedure(s) Performed: OSTECTOMY LEFT GREAT TOE (Left First Toe)  Anesthesia Type: MAC     Last Vitals:  Vitals:   04/23/17 0900 04/23/17 0905  BP: 113/66 (!) 157/85  Pulse: 64 68  Resp: 15   Temp:  (!) 36.4 C  SpO2: 91% 94%    Last Pain:  Vitals:   04/23/17 0905  TempSrc: Oral                 [name]

## 2017-04-23 NOTE — Telephone Encounter (Signed)
-----   Message from Herminio Commons, MD sent at 04/23/2017  9:05 AM EST ----- Regarding: New atrial flutter I was called by PACU nurse about new-onset atrial flutter in this patient of Dr. [name].  He needs a prescription for Eliquis 5 mg bid and close follow up with Dr. [name]. He is asymptomatic.  Dr. Bronson Ing

## 2017-04-23 NOTE — Addendum Note (Signed)
Addendum  created 04/23/17 0840 by [name], MD   Order list changed, Order sets accessed

## 2017-04-23 NOTE — Telephone Encounter (Signed)
Sent RX to [place]. Will forward to front office to schedule follow up with Dr. [name].

## 2017-04-23 NOTE — H&P (Signed)
HISTORY AND PHYSICAL INTERVAL NOTE:  04/23/2017  7:22 AM  [name]  has presented today for surgery, with the diagnosis of chronic ulceration of left great toe, diabetes mellitus with peripheral neuropathy.  The various methods of treatment have been discussed with the patient.  No guarantees were given.  After consideration of risks, benefits and other options for treatment, the patient has consented to surgery.  I have reviewed the patients' chart and labs.    Patient Vitals for the past 24 hrs:  BP Temp Pulse Resp SpO2  04/23/17 0715 - - - 19 95 %  04/23/17 0651 129/86 98 F (36.7 C) 78 16 96 %    A history and physical examination was performed in my office.  The patient was reexamined.  There have been no changes to this history and physical examination.  Marcheta Grammes, DPM

## 2017-04-23 NOTE — Transfer of Care (Signed)
Immediate Anesthesia Transfer of Care Note  Patient: [name]  Procedure(s) Performed: OSTECTOMY LEFT GREAT TOE (Left First Toe)  Patient Location: PACU  Anesthesia Type:MAC  Level of Consciousness: awake and patient cooperative  Airway & Oxygen Therapy: Patient Spontanous Breathing and Patient connected to face mask oxygen  Post-op Assessment: Report given to RN, Post -op Vital signs reviewed and stable and Patient moving all extremities  Post vital signs: Reviewed and stable  Last Vitals:  Vitals:   04/23/17 0651 04/23/17 0715  BP: 129/86   Pulse: 78   Resp: 16 19  Temp: 36.7 C   SpO2: 96% 95%    Last Pain: There were no vitals filed for this visit.    Patients Stated Pain Goal: 6 (03/40/35 2481)  Complications: No apparent anesthesia complications

## 2017-04-23 NOTE — Addendum Note (Signed)
Addendum  created 04/23/17 0946 by [name], MD   Sign clinical note

## 2017-04-24 ENCOUNTER — Encounter (HOSPITAL_COMMUNITY): Payer: Self-pay | Admitting: Podiatry

## 2017-05-02 ENCOUNTER — Encounter: Payer: Self-pay | Admitting: Cardiology

## 2017-05-02 ENCOUNTER — Ambulatory Visit (INDEPENDENT_AMBULATORY_CARE_PROVIDER_SITE_OTHER): Payer: Medicare Other | Admitting: Cardiology

## 2017-05-02 VITALS — BP 200/88 | HR 63 | Ht 76.0 in | Wt 245.0 lb

## 2017-05-02 DIAGNOSIS — I10 Essential (primary) hypertension: Secondary | ICD-10-CM

## 2017-05-02 DIAGNOSIS — I4892 Unspecified atrial flutter: Secondary | ICD-10-CM

## 2017-05-02 DIAGNOSIS — I2510 Atherosclerotic heart disease of native coronary artery without angina pectoris: Secondary | ICD-10-CM

## 2017-05-02 MED ORDER — CARVEDILOL 12.5 MG PO TABS: 18.7500 mg | ORAL_TABLET | Freq: Two times a day (BID) | ORAL | 3 refills | Status: DC

## 2017-05-02 NOTE — Patient Instructions (Signed)
Medication Instructions:  Increase COREG to 18.75 mg TWICE DAILY (1 1/2 TABLETS TWO TIMES DAILY)  STOP ASPIRIN   Labwork: NONE  Testing/Procedures: NONE  Follow-Up: Your physician recommends that you schedule a follow-up appointment in: MAY 2019, BUT IF YOU HAVE ANY PROBLEMS PLEASE CALL us AND WE WILL GET YOU IN SOONER    Any Other Special Instructions Will Be Listed Below (If Applicable). PLEASE KEEP A BLOOD PRESSURE & HEART RATE LOG FOR 1 WEEK- EITHER CALL WITH READINGS OR DROP IT OFF FOR DR. BRANCH TO REVIEW    If you need a refill on your cardiac medications before your next appointment, please call your pharmacy.

## 2017-05-02 NOTE — Progress Notes (Signed)
Clinical Summary [name] is a 66 y.o.male seen today for a focused visit on new diagnosis of atrial flutter. For more detailed history please refer to prior clinic notes.   1. Aflutter - new diagnosis postop after  recent foot surgery - started on eliquis, continued on coreg at that time - since that time occasional intermittent palpitations, can last several minutes.  Compliant with meds.     2. HTN - compliant with meds  Past Medical History:  Diagnosis Date  . Asthma   . CAD (coronary artery disease)    multivessel  . Chronic renal insufficiency   . COPD (chronic obstructive pulmonary disease) ([place])    with ongoing tobacco use and patient failed sham takes  . DM2 (diabetes mellitus, type 2) ([place])   . Dyslipidemia   . Edema    chronic lower extremity secondary to right heart failure and chronic venous insufficiency  . Headache   . HTN (hypertension)   . Morbid obesity ([place])   . Pneumonia   . 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   Marland Kitchen Reflux esophagitis    hx  . Renal insufficiency   . Sleep apnea   . Two-vessel coronary artery disease    moderate. by cath in 2003. Status post stenting of the mdi RCA November 1999 normal left ventricular ejection fraction     No Known Allergies   Current Outpatient Medications  Medication Sig Dispense Refill  . acetaminophen (TYLENOL) 325 MG tablet Take 975 mg daily by mouth. 3 tablets every morning    . albuterol (VENTOLIN HFA) 108 (90 BASE) MCG/ACT inhaler Inhale 2 puffs into the lungs every 6 (six) hours as needed for wheezing or shortness of breath.     Marland Kitchen apixaban (ELIQUIS) 5 MG TABS tablet Take 1 tablet (5 mg total) by mouth 2 (two) times daily. 60 tablet 3  . aspirin 81 MG tablet Take 81 mg by mouth daily. On hold since 08/08/15    . carvedilol (COREG) 12.5 MG tablet Take 12.5 mg 2 (two) times daily with a meal by mouth.    . hydrALAZINE (APRESOLINE) 100 MG  tablet TAKE ONE TABLET BY MOUTH THREE TIMES DAILY 90 tablet 0  . insulin glargine (LANTUS) 100 UNIT/ML injection Inject 35 Units daily after breakfast into the skin.     Marland Kitchen LINZESS 290 MCG CAPS capsule Take 290 mcg by mouth daily as needed (for constipation).   6  . Multiple Vitamin (MULTIVITAMIN) tablet Take 1 tablet by mouth daily.      Marland Kitchen omeprazole (PRILOSEC) 20 MG capsule Take 20 mg daily by mouth.    . ramipril (ALTACE) 10 MG tablet Take 10 mg by mouth at bedtime.     . rosuvastatin (CRESTOR) 20 MG tablet TAKE ONE TABLET BY MOUTH DAILY 30 tablet 0  . silver sulfADIAZINE (SILVADENE) 1 % cream Apply 1 application daily topically. For wound care with dressing    . torsemide (DEMADEX) 20 MG tablet Take 40 mg 2 (two) times daily by mouth.      No current facility-administered medications for this visit.      Past Surgical History:  Procedure Laterality Date  . ABDOMINAL SURGERY    . APPENDECTOMY    . BACK SURGERY    . CARDIAC CATHETERIZATION     stent  . CHOLECYSTECTOMY    . CLOSED REDUCTION SHOULDER DISLOCATION    . diabetic ulcers    .  GROIN EXPLORATION    . OSTECTOMY Left 04/23/2017   Procedure: OSTECTOMY LEFT GREAT TOE;  Surgeon: [name], DPM;  Location: [facility];  Service: Podiatry;  Laterality: Left;  left great toe  . TUMOR REMOVAL     small intestine      No Known Allergies    Family History  Problem Relation Age of Onset  . Diabetes Mother   . Alcohol abuse Father      Social History Mr. Lizak reports that he has been smoking cigarettes.  He started smoking about 48 years ago. He has a 40.00 pack-year smoking history. he has never used smokeless tobacco. Mr. Parlee reports that he does not drink alcohol.   Review of Systems CONSTITUTIONAL: No weight loss, fever, chills, weakness or fatigue.  HEENT: Eyes: No visual loss, blurred vision, double vision or yellow sclerae.No hearing loss, sneezing, congestion, runny nose or sore throat.  SKIN: No rash or  itching.  CARDIOVASCULAR: per hpi RESPIRATORY: No shortness of breath, cough or sputum.  GASTROINTESTINAL: No anorexia, nausea, vomiting or diarrhea. No abdominal pain or blood.  GENITOURINARY: No burning on urination, no polyuria NEUROLOGICAL: No headache, dizziness, syncope, paralysis, ataxia, numbness or tingling in the extremities. No change in bowel or bladder control.  MUSCULOSKELETAL: No muscle, back pain, joint pain or stiffness.  LYMPHATICS: No enlarged nodes. No history of splenectomy.  PSYCHIATRIC: No history of depression or anxiety.  ENDOCRINOLOGIC: No reports of sweating, cold or heat intolerance. No polyuria or polydipsia.  Marland Kitchen   Physical Examination Vitals:   05/02/17 1340  BP: (!) 200/88  Pulse: 63  SpO2: 94%   Vitals:   05/02/17 1340  Weight: 245 lb (111.1 kg)  Height: 6\' 4"  (1.93 m)    Gen: resting comfortably, no acute distress HEENT: no scleral icterus, pupils equal round and reactive, no palptable cervical adenopathy,  CV: RRR, no m/r/g, no jvd Resp: Clear to auscultation bilaterally GI: abdomen is soft, non-tender, non-distended, normal bowel sounds, no hepatosplenomegaly MSK: extremities are warm, no edema.  Skin: warm, no rash Neuro:  no focal deficits Psych: appropriate affect   Diagnostic Studies 05/2001 Cath FINDINGS:  1. Left main trunk: Medium caliber vessel. This is a long vessel with a  distal taper of 20-30%.  2. Left anterior descending artery: This is a medium caliber vessel that  provides a large bifurcating first diagonal Jinny Sweetland in the proximal segment  and before then extending to the apex, the LAD tapers significantly after  the diagonal Claudie Brickhouse. There is moderate disease of 50-60% after the  diagonal Betha Shadix and then a focal eccentric narrowing of 60-70% in the mid  section of the LAD. The diagonal Ferdinand Revoir has moderate disease of 30-40% in  the proximal segment. The lateral division is a small caliber vessel with  an  ostial narrowing of 60%.  3. Left circumflex artery: This is a small caliber vessel that provides a  trivial first marginal Lendora Keys in the mid section and a medium caliber  second marginal Deshea Pooley distally. The mid AV circumflex at the takeoff of  the first and second diagonal branches had moderate diffuse disease of  60-70%.  4. Right coronary artery: Dominant. This is a large caliber vessel that  provides a posterior descending artery and two posteroventricular branches  in the terminal segment. The right coronary artery has evidence of an  existing stent in the mid section that is widely patent. Prior to stent is  a focal discrete narrowing of 40-50%. Distal to  the stent is moderate  disease of 30%. The distal Ronan Duecker vessels also have mild disease of 30%.  5. Left ventricle: Normal end-systolic and end-diastolic dimensions. Overall  left ventricular function is well preserved. Ejection fraction is greater  than 65%. No mitral regurgitation. LV pressure is 180/10, aortic is  180/90. LV EDP equals 20.  ASSESSMENT AND PLAN: Mr. Crooke is a 66 year old gentleman with moderate  three-vessel coronary artery disease. The patients plaque burden in the left  anterior descending artery and circumflex appears to have increased somewhat  compared to his previous angiogram. Unfortunately, the mid and distal left  anterior descending artery is a small caliber vessel with a long diffuse  segment of disease and would be associated with a high restenosis rate.  Similarly, the circumflex would suffer the same fate.  Further assessment with a stress imaging study would be prudent to isolate the  area of ischemia. Percutaneous intervention may then be targeted if  indicated. In addition, aggressive medical therapy should be pursued as the  patient currently is not on any anti-anginal therapy and has poorly controlled  hypertension. The patient will also be counseled regarding  smoking cessation.    07/2013 PFTs Mild obstruction    10/2014 Lexiscan  Unable to adequately ambulate to treadmill due to right hip pain. Lexiscan employed.   Defect 1: There is a small defect of mild severity present in the mid inferior and apical inferior location. This defect is partially reversible.   This is a low risk study.   Nuclear stress EF: 58%.   Small region of apical inferior ischemia    07/2015 Echo Study Conclusions  - Left ventricle: The cavity size was normal. Wall thickness was  increased increased in a pattern of mild to moderate LVH.  Systolic function was normal. The estimated ejection fraction was  in the range of 60% to 65%. Diastolic function is abnormal,  indeterminate grade. Wall motion was normal; there were no  regional wall motion abnormalities. - Aortic valve: Mildly calcified annulus. Trileaflet; mildly  thickened leaflets. Valve area (VTI): 2.39 cm^2. Valve area  (Vmax): 2.58 cm^2. Valve area (Vmean): 2.7 cm^2. - Mitral valve: Mildly calcified annulus. Normal thickness leaflets  . - Technically adequate study.  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 Recommend followup by ultrasound in 2 years. This recommendation follows ACR consensus guidelines:       Assessment and Plan  1. Paroxysmal aflutter -EKG in clinic today shows he is back in SR today - has some intermittent palptations. We will try increasing his coreg to 18.75mg  bid, follow heart rates closely. If does not tolerate higher beta blocker may need to consider antiarrhthmic option or ablation. Prior to commiting to strategy change would obtain monitor to confirm his symptoms are indeed related to recurrent aflutter - CHADS2Vasc score is  4 (HTN, CHF, CAD, DM2), continue eliquis and stop ASA.    2. HTN - quite elevated in clinic, asymptomatic - we  will follow with higher dose of coreg - submit bp log in 1 week   Arnoldo Lenis, M.D.

## 2017-05-05 ENCOUNTER — Encounter: Payer: Self-pay | Admitting: Cardiology

## 2017-05-09 ENCOUNTER — Other Ambulatory Visit: Payer: Self-pay

## 2017-05-09 ENCOUNTER — Observation Stay (HOSPITAL_COMMUNITY)
Admission: EM | Admit: 2017-05-09 | Discharge: 2017-05-10 | Disposition: A | Discharge status: Home / Self Care | Payer: Medicare Other | Attending: Internal Medicine | Admitting: Internal Medicine

## 2017-05-09 ENCOUNTER — Encounter (HOSPITAL_COMMUNITY): Payer: Self-pay | Admitting: *Deleted

## 2017-05-09 ENCOUNTER — Emergency Department (HOSPITAL_COMMUNITY): Payer: Medicare Other

## 2017-05-09 DIAGNOSIS — J449 Chronic obstructive pulmonary disease, unspecified: Secondary | ICD-10-CM | POA: Diagnosis not present

## 2017-05-09 DIAGNOSIS — R911 Solitary pulmonary nodule: Secondary | ICD-10-CM | POA: Insufficient documentation

## 2017-05-09 DIAGNOSIS — F17200 Nicotine dependence, unspecified, uncomplicated: Secondary | ICD-10-CM | POA: Diagnosis present

## 2017-05-09 DIAGNOSIS — E1121 Type 2 diabetes mellitus with diabetic nephropathy: Secondary | ICD-10-CM | POA: Insufficient documentation

## 2017-05-09 DIAGNOSIS — I130 Hypertensive heart and chronic kidney disease with heart failure and stage 1 through stage 4 chronic kidney disease, or unspecified chronic kidney disease: Secondary | ICD-10-CM | POA: Diagnosis not present

## 2017-05-09 DIAGNOSIS — I731 Thromboangiitis obliterans [Buerger's disease]: Secondary | ICD-10-CM | POA: Diagnosis not present

## 2017-05-09 DIAGNOSIS — I10 Essential (primary) hypertension: Secondary | ICD-10-CM | POA: Diagnosis not present

## 2017-05-09 DIAGNOSIS — R079 Chest pain, unspecified: Secondary | ICD-10-CM | POA: Diagnosis present

## 2017-05-09 DIAGNOSIS — E1122 Type 2 diabetes mellitus with diabetic chronic kidney disease: Secondary | ICD-10-CM | POA: Insufficient documentation

## 2017-05-09 DIAGNOSIS — I2510 Atherosclerotic heart disease of native coronary artery without angina pectoris: Secondary | ICD-10-CM | POA: Diagnosis not present

## 2017-05-09 DIAGNOSIS — J42 Unspecified chronic bronchitis: Secondary | ICD-10-CM

## 2017-05-09 DIAGNOSIS — E876 Hypokalemia: Secondary | ICD-10-CM

## 2017-05-09 DIAGNOSIS — I5032 Chronic diastolic (congestive) heart failure: Secondary | ICD-10-CM | POA: Diagnosis not present

## 2017-05-09 DIAGNOSIS — R05 Cough: Secondary | ICD-10-CM | POA: Diagnosis not present

## 2017-05-09 DIAGNOSIS — F17210 Nicotine dependence, cigarettes, uncomplicated: Secondary | ICD-10-CM | POA: Diagnosis not present

## 2017-05-09 DIAGNOSIS — R0789 Other chest pain: Principal | ICD-10-CM | POA: Insufficient documentation

## 2017-05-09 DIAGNOSIS — Z79899 Other long term (current) drug therapy: Secondary | ICD-10-CM | POA: Diagnosis not present

## 2017-05-09 DIAGNOSIS — N179 Acute kidney failure, unspecified: Secondary | ICD-10-CM | POA: Diagnosis not present

## 2017-05-09 DIAGNOSIS — N184 Chronic kidney disease, stage 4 (severe): Secondary | ICD-10-CM | POA: Insufficient documentation

## 2017-05-09 DIAGNOSIS — Z794 Long term (current) use of insulin: Secondary | ICD-10-CM | POA: Diagnosis not present

## 2017-05-09 DIAGNOSIS — E785 Hyperlipidemia, unspecified: Secondary | ICD-10-CM | POA: Insufficient documentation

## 2017-05-09 DIAGNOSIS — R0602 Shortness of breath: Secondary | ICD-10-CM | POA: Diagnosis not present

## 2017-05-09 DIAGNOSIS — I4892 Unspecified atrial flutter: Secondary | ICD-10-CM | POA: Diagnosis not present

## 2017-05-09 LAB — COMPREHENSIVE METABOLIC PANEL
ALT: 9 U/L — ABNORMAL LOW (ref 17–63)
AST: 14 U/L — ABNORMAL LOW (ref 15–41)
Albumin: 2.9 g/dL — ABNORMAL LOW (ref 3.5–5.0)
Alkaline Phosphatase: 50 U/L (ref 38–126)
Anion gap: 12 (ref 5–15)
BUN: 58 mg/dL — ABNORMAL HIGH (ref 6–20)
CO2: 24 mmol/L (ref 22–32)
Calcium: 8.4 mg/dL — ABNORMAL LOW (ref 8.9–10.3)
Chloride: 101 mmol/L (ref 101–111)
Creatinine, Ser: 3.24 mg/dL — ABNORMAL HIGH (ref 0.61–1.24)
GFR calc Af Amer: 22 mL/min — ABNORMAL LOW (ref >60)
GFR calc non Af Amer: 19 mL/min — ABNORMAL LOW (ref >60)
Glucose, Bld: 148 mg/dL — ABNORMAL HIGH (ref 65–99)
Potassium: 3 mmol/L — ABNORMAL LOW (ref 3.5–5.1)
Sodium: 137 mmol/L (ref 135–145)
Total Bilirubin: 0.4 mg/dL (ref 0.3–1.2)
Total Protein: 6.7 g/dL (ref 6.5–8.1)

## 2017-05-09 LAB — CBC WITH DIFFERENTIAL/PLATELET
Basophils Absolute: 0 10*3/uL (ref 0.0–0.1)
Basophils Relative: 0 %
Eosinophils Absolute: 0.1 10*3/uL (ref 0.0–0.7)
Eosinophils Relative: 1 %
HCT: 44.6 % (ref 39.0–52.0)
Hemoglobin: 14.7 g/dL (ref 13.0–17.0)
Lymphocytes Relative: 22 %
Lymphs Abs: 2.3 10*3/uL (ref 0.7–4.0)
MCH: 31.8 pg (ref 26.0–34.0)
MCHC: 33 g/dL (ref 30.0–36.0)
MCV: 96.5 fL (ref 78.0–100.0)
Monocytes Absolute: 0.9 10*3/uL (ref 0.1–1.0)
Monocytes Relative: 9 %
Neutro Abs: 7.3 10*3/uL (ref 1.7–7.7)
Neutrophils Relative %: 68 %
Platelets: 293 10*3/uL (ref 150–400)
RBC: 4.62 MIL/uL (ref 4.22–5.81)
RDW: 14.8 % (ref 11.5–15.5)
WBC: 10.6 10*3/uL — ABNORMAL HIGH (ref 4.0–10.5)

## 2017-05-09 LAB — GLUCOSE, CAPILLARY
Glucose-Capillary: 480 mg/dL — ABNORMAL HIGH (ref 65–99)
Glucose-Capillary: 470 mg/dL — ABNORMAL HIGH (ref 65–99)

## 2017-05-09 LAB — TROPONIN I
Troponin I: 0.11 ng/mL (ref <0.03)
Troponin I: 0.45 ng/mL (ref <0.03)

## 2017-05-09 LAB — BRAIN NATRIURETIC PEPTIDE: B Natriuretic Peptide: 293 pg/mL — ABNORMAL HIGH (ref 0.0–100.0)

## 2017-05-09 MED ORDER — METHYLPREDNISOLONE SODIUM SUCC 125 MG IJ SOLR
125.0000 mg | Freq: Once | INTRAMUSCULAR | Status: AC
  Administered 2017-05-09: 125 mg via INTRAVENOUS
  Filled 2017-05-09: qty 2

## 2017-05-09 MED ORDER — POTASSIUM CHLORIDE 10 MEQ/100ML IV SOLN
10.0000 meq | INTRAVENOUS | Status: DC
  Administered 2017-05-09 (×2): 10 meq via INTRAVENOUS
  Filled 2017-05-09 (×2): qty 100

## 2017-05-09 MED ORDER — INSULIN ASPART 100 UNIT/ML ~~LOC~~ SOLN
0.0000 [IU] | Freq: Three times a day (TID) | SUBCUTANEOUS | Status: DC
  Administered 2017-05-10: 5 [IU] via SUBCUTANEOUS
  Administered 2017-05-10: 3 [IU] via SUBCUTANEOUS

## 2017-05-09 MED ORDER — LINACLOTIDE 145 MCG PO CAPS: 290.0000 ug | ORAL_CAPSULE | Freq: Every day | ORAL | Status: DC | PRN

## 2017-05-09 MED ORDER — ALBUTEROL SULFATE (2.5 MG/3ML) 0.083% IN NEBU: 2.5000 mg | INHALATION_SOLUTION | Freq: Four times a day (QID) | RESPIRATORY_TRACT | Status: DC | PRN

## 2017-05-09 MED ORDER — ROSUVASTATIN CALCIUM 20 MG PO TABS
20.0000 mg | ORAL_TABLET | Freq: Every day | ORAL | Status: DC
  Administered 2017-05-09 – 2017-05-10 (×2): 20 mg via ORAL
  Filled 2017-05-09 (×2): qty 1

## 2017-05-09 MED ORDER — CARVEDILOL 12.5 MG PO TABS
18.7500 mg | ORAL_TABLET | Freq: Two times a day (BID) | ORAL | Status: DC
  Administered 2017-05-09 – 2017-05-10 (×2): 18.75 mg via ORAL
  Filled 2017-05-09 (×2): qty 2

## 2017-05-09 MED ORDER — HYDRALAZINE HCL 25 MG PO TABS
100.0000 mg | ORAL_TABLET | Freq: Three times a day (TID) | ORAL | Status: DC
  Administered 2017-05-09 – 2017-05-10 (×4): 100 mg via ORAL
  Filled 2017-05-09 (×4): qty 4

## 2017-05-09 MED ORDER — ALBUTEROL SULFATE HFA 108 (90 BASE) MCG/ACT IN AERS: 2.0000 | INHALATION_SPRAY | Freq: Four times a day (QID) | RESPIRATORY_TRACT | Status: DC | PRN

## 2017-05-09 MED ORDER — NITROGLYCERIN 0.4 MG SL SUBL
0.4000 mg | SUBLINGUAL_TABLET | SUBLINGUAL | Status: DC | PRN
  Administered 2017-05-09 (×2): 0.4 mg via SUBLINGUAL
  Filled 2017-05-09: qty 1

## 2017-05-09 MED ORDER — PANTOPRAZOLE SODIUM 40 MG PO TBEC
40.0000 mg | DELAYED_RELEASE_TABLET | Freq: Every day | ORAL | Status: DC
Start: 2017-05-09 — End: 2017-05-10
  Administered 2017-05-09 – 2017-05-10 (×2): 40 mg via ORAL
  Filled 2017-05-09 (×2): qty 1

## 2017-05-09 MED ORDER — ZOLPIDEM TARTRATE 5 MG PO TABS
5.0000 mg | ORAL_TABLET | Freq: Every evening | ORAL | Status: DC | PRN
  Administered 2017-05-09: 5 mg via ORAL
  Filled 2017-05-09: qty 1

## 2017-05-09 MED ORDER — INSULIN GLARGINE 100 UNIT/ML ~~LOC~~ SOLN
35.0000 [IU] | Freq: Every day | SUBCUTANEOUS | Status: DC
  Administered 2017-05-09: 35 [IU] via SUBCUTANEOUS
  Filled 2017-05-09 (×3): qty 0.35

## 2017-05-09 MED ORDER — ACETAMINOPHEN 325 MG PO TABS: 650.0000 mg | ORAL_TABLET | ORAL | Status: DC | PRN

## 2017-05-09 MED ORDER — ONDANSETRON HCL 4 MG/2ML IJ SOLN: 4.0000 mg | Freq: Four times a day (QID) | INTRAMUSCULAR | Status: DC | PRN

## 2017-05-09 MED ORDER — POTASSIUM CHLORIDE IN NACL 40-0.9 MEQ/L-% IV SOLN
INTRAVENOUS | Status: DC
  Administered 2017-05-09: 75 mL/h via INTRAVENOUS
  Filled 2017-05-09 (×6): qty 1000

## 2017-05-09 MED ORDER — SILVER SULFADIAZINE 1 % EX CREA
1.0000 "application " | TOPICAL_CREAM | Freq: Every day | CUTANEOUS | Status: DC
  Filled 2017-05-09: qty 85

## 2017-05-09 MED ORDER — APIXABAN 5 MG PO TABS
5.0000 mg | ORAL_TABLET | Freq: Two times a day (BID) | ORAL | Status: DC
  Administered 2017-05-09 – 2017-05-10 (×2): 5 mg via ORAL
  Filled 2017-05-09 (×2): qty 1

## 2017-05-09 MED ORDER — POTASSIUM CHLORIDE CRYS ER 20 MEQ PO TBCR
40.0000 meq | EXTENDED_RELEASE_TABLET | Freq: Two times a day (BID) | ORAL | Status: DC
  Administered 2017-05-09 – 2017-05-10 (×2): 40 meq via ORAL
  Filled 2017-05-09 (×2): qty 2

## 2017-05-09 MED ORDER — ALBUTEROL SULFATE (2.5 MG/3ML) 0.083% IN NEBU
5.0000 mg | INHALATION_SOLUTION | Freq: Once | RESPIRATORY_TRACT | Status: AC
  Administered 2017-05-09: 5 mg via RESPIRATORY_TRACT
  Filled 2017-05-09: qty 6

## 2017-05-09 MED ORDER — GI COCKTAIL ~~LOC~~: 30.0000 mL | Freq: Four times a day (QID) | ORAL | Status: DC | PRN

## 2017-05-09 NOTE — Progress Notes (Addendum)
CRITICAL VALUE ALERT  Critical Value: Troponin 0.45  Date & Time Notied:  05/09/17 @ 7001  Provider Notified: Nehemiah Settle, MD.  Orders Received/Actions taken: Continue to monitor patient. Changed frequency of Troponin draws to Q3 hours.

## 2017-05-09 NOTE — Progress Notes (Signed)
Patient's blood sugar was taken and reading 480, patient did not want to have blood sugar retaken.  States his blood sugar is never this high.  Repeat blood sugar was 470.  MD paged.  Patient states that he did not take Lantus this morning before arriving at hospital.  Patient also states that he does not take Lantus on daily basis.  He only takes this if his blood sugar is high.  He does not take meal coverage at home.

## 2017-05-09 NOTE — ED Notes (Signed)
CRITICAL VALUE ALERT  Critical Value: troponin 0.11  Date & Time Notied:  05/09/17 1323  Provider Notified: dr Vanita Panda  Orders Received/Actions taken:

## 2017-05-09 NOTE — H&P (Signed)
History and Physical  [name] OVF:[id number] DOB: [birth year] DOA: 05/09/2017  Referring physician: Dr Vanita Panda, ED physician PCP: [name], MD  Outpatient Specialists:   Dr. [name]  Dr. [name] (podiatry)  Patient Coming From: Home  Chief Complaint: Chest pain, shortness of breath  HPI: [name] STATES is a 66 y.o. 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  Patient was seen due to chest pain with "bounding heart rate" that started around 8:30 in the morning and improved this prior to the patient arriving to the hospital.  Patient received a couple doses of nitroglycerin, which helped the patient's heart rate and discomfort.  At this time, the patient was short of breath, but without significant increased sputum production or wheeze.  On arrival, patient was noted to be in sinus rhythm, though the patient has a history of paroxysmal atrial flutter with rapid ventricular rates postoperatively from his ostectomy.  No other palliating or provoking factors.  His symptoms are greatly improving.  Emergency Department Course: EKG shows sinus rhythm with normal ventricular rate.  Patient hypokalemic with an increased creatinine of 3.24, which is elevated from his baseline of 2.6.  His troponin was elevated at 0.11.  The EDP talks with cardiology, who recommended observation, correction of his electrolytes, holding his diuretics to improve his kidney function.  The patient did get a dose of steroids due to his shortness of breath, but the patient is currently on room air breathing comfortably.  Review of Systems:    Pt denies any fevers, chills, nausea, vomiting, diarrhea, constipation, abdominal pain, dyspnea on exertion, orthopnea, cough, wheezing,  palpitations, headache, vision changes, lightheadedness, dizziness, melena, rectal bleeding.  Review of systems are otherwise negative  Past Medical History:  Diagnosis Date  . Asthma   . CAD (coronary artery disease)    multivessel  . Chronic renal insufficiency   . COPD (chronic obstructive pulmonary disease) ([place])    with ongoing tobacco use and patient failed sham takes  . DM2 (diabetes mellitus, type 2) ([place])   . Dyslipidemia   . Edema    chronic lower extremity secondary to right heart failure and chronic venous insufficiency  . Headache   . HTN (hypertension)   . Morbid obesity ([place])   . Pneumonia   . 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   Marland Kitchen Reflux esophagitis    hx  . Renal insufficiency   . Sleep apnea   . Two-vessel coronary artery disease    moderate. by cath in 2003. Status post stenting of the mdi RCA November 1999 normal left ventricular ejection fraction   Past Surgical History:  Procedure Laterality Date  . ABDOMINAL SURGERY    . APPENDECTOMY    . BACK SURGERY    . CARDIAC CATHETERIZATION     stent  . CHOLECYSTECTOMY    . CLOSED REDUCTION SHOULDER DISLOCATION    . diabetic ulcers    . GROIN EXPLORATION    . OSTECTOMY Left 04/23/2017   Procedure: OSTECTOMY LEFT GREAT TOE;  Surgeon: [name], DPM;  Location: [facility];  Service: Podiatry;  Laterality: Left;  left great toe  . TUMOR REMOVAL     small intestine    Social History:  reports that he has been  smoking cigarettes.  He started smoking about 49 years ago. He has a 40.00 pack-year smoking history. he has never used smokeless tobacco. He reports that he does not drink alcohol or use drugs. Patient lives at home  No Known Allergies  Family History  Problem Relation Age of Onset  . Diabetes Mother   . Alcohol abuse Father       Prior to Admission medications   Medication Sig Start Date End Date Taking? Authorizing Provider    acetaminophen (TYLENOL) 325 MG tablet Take 975 mg daily by mouth. 3 tablets every morning   Yes [provider]  albuterol (VENTOLIN HFA) 108 (90 BASE) MCG/ACT inhaler Inhale 2 puffs into the lungs every 6 (six) hours as needed for wheezing or shortness of breath.    Yes [provider]  apixaban (ELIQUIS) 5 MG TABS tablet Take 1 tablet (5 mg total) by mouth 2 (two) times daily. 04/23/17  Yes Herminio Commons, MD  carvedilol (COREG) 12.5 MG tablet Take 1.5 tablets (18.75 mg total) by mouth 2 (two) times daily with a meal. 05/02/17  Yes Branch, Alphonse Guild, MD  hydrALAZINE (APRESOLINE) 100 MG tablet TAKE ONE TABLET BY MOUTH THREE TIMES DAILY 03/11/17  Yes Branch, Alphonse Guild, MD  insulin glargine (LANTUS) 100 UNIT/ML injection Inject 35 Units daily after breakfast into the skin.    Yes [provider]  LINZESS 290 MCG CAPS capsule Take 290 mcg by mouth daily as needed (for constipation).  07/26/15  Yes [provider]  Multiple Vitamin (MULTIVITAMIN) tablet Take 1 tablet by mouth daily.     Yes [provider]  omeprazole (PRILOSEC) 20 MG capsule Take 20 mg daily by mouth.   Yes [provider]  ramipril (ALTACE) 10 MG tablet Take 10 mg by mouth at bedtime.    Yes [provider]  rosuvastatin (CRESTOR) 20 MG tablet TAKE ONE TABLET BY MOUTH DAILY 03/11/17  Yes Branch, Alphonse Guild, MD  torsemide (DEMADEX) 20 MG tablet Take 40 mg 2 (two) times daily by mouth.    Yes [provider]  silver sulfADIAZINE (SILVADENE) 1 % cream Apply 1 application daily topically. For wound care with dressing    [provider]    Physical Exam: BP (!) 146/81   Pulse 73   Temp 97.6 F (36.4 C)   Resp 12   Ht 6\' 4"  (1.93 m)   Wt 106.6 kg (235 lb)   SpO2 99%   BMI 28.61 kg/m   General: Older Caucasian male. Awake and alert and oriented x3. No acute cardiopulmonary distress.  HEENT: Normocephalic atraumatic.  Right and left ears normal  in appearance.  Pupils equal, round, reactive to light. Extraocular muscles are intact. Sclerae anicteric and noninjected.  Moist mucosal membranes. No mucosal lesions.  Neck: Neck supple without lymphadenopathy. No carotid bruits. No masses palpated.  Cardiovascular: Regular rate with normal S1-S2 sounds. No murmurs, rubs, gallops auscultated. No JVD.  Respiratory: Good respiratory effort with no wheezes, rales, rhonchi. Lungs clear to auscultation bilaterally.  No accessory muscle use. Abdomen: Soft, nontender, nondistended. Active bowel sounds. No masses or hepatosplenomegaly  Skin: Several missing digits on feet bilaterally.  No rashes, lesions, or ulcerations.  Dry, warm to touch. 2+ dorsalis pedis and radial pulses. Musculoskeletal: No calf or leg pain. All major joints not erythematous nontender.  No upper or lower joint deformation.  Good ROM.  No contractures  Psychiatric: Intact judgment and insight. Pleasant and cooperative. Neurologic: No focal neurological  deficits. Strength is 5/5 and symmetric in upper and lower extremities.  Cranial nerves II through XII are grossly intact.           Labs on Admission: I have personally reviewed following labs and imaging studies  CBC: Recent Labs  Lab 05/09/17 1227  WBC 10.6*  NEUTROABS 7.3  HGB 14.7  HCT 44.6  MCV 96.5  PLT 950   Basic Metabolic Panel: Recent Labs  Lab 05/09/17 1227  NA 137  K 3.0*  CL 101  CO2 24  GLUCOSE 148*  BUN 58*  CREATININE 3.24*  CALCIUM 8.4*   GFR: Estimated Creatinine Clearance: 30.9 mL/min (A) (by C-G formula based on SCr of 3.24 mg/dL (H)). Liver Function Tests: Recent Labs  Lab 05/09/17 1227  AST 14*  ALT 9*  ALKPHOS 50  BILITOT 0.4  PROT 6.7  ALBUMIN 2.9*   No results for input(s): LIPASE, AMYLASE in the last 168 hours. No results for input(s): AMMONIA in the last 168 hours. Coagulation Profile: No results for input(s): INR, PROTIME in the last 168 hours. Cardiac  Enzymes: Recent Labs  Lab 05/09/17 1227  TROPONINI 0.11*   BNP (last 3 results) No results for input(s): PROBNP in the last 8760 hours. HbA1C: No results for input(s): HGBA1C in the last 72 hours. CBG: No results for input(s): GLUCAP in the last 168 hours. Lipid Profile: No results for input(s): CHOL, HDL, LDLCALC, TRIG, CHOLHDL, LDLDIRECT in the last 72 hours. Thyroid Function Tests: No results for input(s): TSH, T4TOTAL, FREET4, T3FREE, THYROIDAB in the last 72 hours. Anemia Panel: No results for input(s): VITAMINB12, FOLATE, FERRITIN, TIBC, IRON, RETICCTPCT in the last 72 hours. Urine analysis:    Component Value Date/Time   COLORURINE YELLOW 04/04/2016 1500   APPEARANCEUR CLEAR 04/04/2016 1500   LABSPEC 1.010 04/04/2016 1500   PHURINE 7.0 04/04/2016 1500   GLUCOSEU NEGATIVE 04/04/2016 1500   HGBUR MODERATE (A) 04/04/2016 1500   BILIRUBINUR NEGATIVE 04/04/2016 1500   KETONESUR NEGATIVE 04/04/2016 1500   PROTEINUR >300 (A) 04/04/2016 1500   UROBILINOGEN 0.2 09/27/2014 0027   NITRITE NEGATIVE 04/04/2016 1500   LEUKOCYTESUR NEGATIVE 04/04/2016 1500   Sepsis Labs: @LABRCNTIP (procalcitonin:4,lacticidven:4) )No results found for this or any previous visit (from the past 240 hour(s)).   Radiological Exams on Admission: Dg Chest 2 View  Result Date: 05/09/2017 CLINICAL DATA:  Cough, congestion, shortness of breath, and weakness. EXAM: CHEST  2 VIEW COMPARISON:  04/04/2016. FINDINGS: Normal heart size. Clear lung fields. Hyperinflation. Old RIGHT-sided rib fractures. No consolidation or edema. 8 x 10 mm noncalcified density seen best on the PA view, LEFT mid lung zone, could represent a pulmonary nodule. CT of the chest with contrast is recommended for further evaluation. No osseous findings. Compared with prior exam, the nodule is not definitely present. IMPRESSION: 8 x 10 mm soft tissue density LEFT midlung zone requires further workup. In this patient with tobacco abuse  disorder, CT of the chest with contrast recommended to exclude a pulmonary nodule. Hyperinflation without consolidation or edema. Old RIGHT-sided rib fractures. Electronically Signed   By: Staci Righter M.D.   On: 05/09/2017 13:43    EKG: Independently reviewed.  Sinus rhythm.  No acute ST elevation or depression.  Assessment/Plan: Principal Problem:   Chest pain Active Problems:   BUERGER'S DISEASE   COPD (chronic obstructive pulmonary disease) ([facility])   Hypertension   Hypokalemia   Diabetes mellitus with nephropathy ([facility])   Atrial flutter ([facility])   Acute renal failure superimposed on  stage 4 chronic kidney disease ([place])    This patient was discussed with the ED physician, including pertinent vitals, physical exam findings, labs, and imaging.  We also discussed care given by the ED provider.  1.  Chest pain  Observation  Serial troponins  Telemetry monitoring 2.  Atrial flutter  Continue Eliquis  Currently rate controlled  Continue beta-blockers for rate control 3.  Hypokalemia  Change patient to oral replacements  Recheck potassium in the morning 4.  Acute renal injury superimposed on stage IV chronic kidney disease  Gentle IV fluids overnight  Recheck creatinine in the morning  Hold ACE inhibitor 5.  Diabetes  Continue home insulin with sliding scale and CBGs before meals and nightly 6.  Hypertension  Continue medications 7.  COPD  Patient is currently in COPD exacerbation  We will hold steroids 8.  Buerger's disease  DVT prophylaxis: Eliquis Consultants: No formal cardiology consult performed Code Status: Full code Family Communication: Wife in the room  Disposition Plan: patient to return home following Hospital stay   Truett Mainland, DO [facility] Pager [phone]  If 7PM-7AM, please contact night-coverage [email or web address] Password [email or web address]

## 2017-05-09 NOTE — ED Triage Notes (Signed)
Chest pain onset 0830 today

## 2017-05-09 NOTE — ED Notes (Signed)
Patient transported to X-ray 

## 2017-05-09 NOTE — ED Provider Notes (Signed)
[facility] EMERGENCY DEPARTMENT Provider Note   CSN: [id number] Arrival date & time: 05/09/17  1158     History   Chief Complaint Chief Complaint  Patient presents with  . Chest Pain    HPI [name] is a 66 y.o. male.  HPI  Patient presents with concern of chest pain, dyspnea, palpitations. Patient acknowledges multiple medical issues including CAD, atrial flutter, prior left great toe amputation. He notes that he had been generally well to the past year, when upon awakening he noticed palpitations, chest pressure, like a brick sitting on his chest. Symptoms have improved somewhat, spontaneously. In route to the patient saw his orthopedist, had evaluation of the great toe, which was deemed to be well-healing. He also smoked a cigarette. He notes that he feels better now than he did earlier today. He specifically denies any fever, syncope.  He has recently started Eliquis, and had increasing his Coreg.   Past Medical History:  Diagnosis Date  . Asthma   . CAD (coronary artery disease)    multivessel  . Chronic renal insufficiency   . COPD (chronic obstructive pulmonary disease) ([place])    with ongoing tobacco use and patient failed sham takes  . DM2 (diabetes mellitus, type 2) ([place])   . Dyslipidemia   . Edema    chronic lower extremity secondary to right heart failure and chronic venous insufficiency  . Headache   . HTN (hypertension)   . Morbid obesity ([place])   . Pneumonia   . 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   Marland Kitchen Reflux esophagitis    hx  . Renal insufficiency   . Sleep apnea   . Two-vessel coronary artery disease    moderate. by cath in 2003. Status post stenting of the mdi RCA November 1999 normal left ventricular ejection fraction    Patient Active Problem List   Diagnosis Date Noted  . Hypokalemia 04/05/2016  . Diabetes mellitus with nephropathy ([place]) 04/05/2016  . Hyponatremia  04/05/2016  . Hematuria 04/05/2016  . Diarrhea 04/05/2016  . Bilateral diabetic foot ulcer associated with secondary diabetes mellitus ([place]) 04/05/2016  . Streptococcal bacteremia 04/05/2016  . Cellulitis of leg, left 04/05/2016  . Sepsis ([place]) 04/04/2016  . CAD (coronary artery disease) 11/10/2015  . Bilateral lower extremity edema 07/25/2015  . Foot infection 09/27/2014  . Diabetic foot infection ([place]) 09/27/2014  . CKD stage 3 due to type 2 diabetes mellitus ([place])   . Diabetes mellitus with renal manifestations, uncontrolled ([place])   . Benign essential HTN   . ED (erectile dysfunction) 06/09/2012  . Hypertension 03/11/2011  . OSTEOMYELITIS, ACUTE, ANKLE/FOOT 07/24/2010  . EDEMA 02/15/2010  . MORBID OBESITY 07/05/2009  . TOBACCO ABUSE 07/05/2009  . OBSTRUCTIVE SLEEP APNEA 07/05/2009  . COPD 07/05/2009  . 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 HYPERLIPIDEMIA 03/30/2008  . Generalized anxiety disorder 03/30/2008  . [place] DISEASE 03/30/2008    Past Surgical History:  Procedure Laterality Date  . ABDOMINAL SURGERY    . APPENDECTOMY    . BACK SURGERY    . CARDIAC CATHETERIZATION     stent  . CHOLECYSTECTOMY    . CLOSED REDUCTION SHOULDER DISLOCATION    . diabetic ulcers    . GROIN EXPLORATION    . OSTECTOMY Left 04/23/2017   Procedure: OSTECTOMY LEFT GREAT TOE;  Surgeon: [name], DPM;  Location: [facility];  Service: Podiatry;  Laterality: Left;  left great toe  . TUMOR REMOVAL  small intestine        Home Medications    Prior to Admission medications   Medication Sig Start Date End Date Taking? Authorizing Provider  acetaminophen (TYLENOL) 325 MG tablet Take 975 mg daily by mouth. 3 tablets every morning    [provider]  albuterol (VENTOLIN HFA) 108 (90 BASE) MCG/ACT inhaler Inhale 2 puffs into the lungs every 6 (six) hours as needed for wheezing or shortness of breath.     [provider]  apixaban (ELIQUIS) 5 MG TABS tablet Take 1 tablet (5 mg total)  by mouth 2 (two) times daily. 04/23/17   Herminio Commons, MD  carvedilol (COREG) 12.5 MG tablet Take 1.5 tablets (18.75 mg total) by mouth 2 (two) times daily with a meal. 05/02/17   Branch, Alphonse Guild, MD  hydrALAZINE (APRESOLINE) 100 MG tablet TAKE ONE TABLET BY MOUTH THREE TIMES DAILY 03/11/17   Arnoldo Lenis, MD  insulin glargine (LANTUS) 100 UNIT/ML injection Inject 35 Units daily after breakfast into the skin.     [provider]  LINZESS 290 MCG CAPS capsule Take 290 mcg by mouth daily as needed (for constipation).  07/26/15   [provider]  Multiple Vitamin (MULTIVITAMIN) tablet Take 1 tablet by mouth daily.      [provider]  omeprazole (PRILOSEC) 20 MG capsule Take 20 mg daily by mouth.    [provider]  ramipril (ALTACE) 10 MG tablet Take 10 mg by mouth at bedtime.     [provider]  rosuvastatin (CRESTOR) 20 MG tablet TAKE ONE TABLET BY MOUTH DAILY 03/11/17   Arnoldo Lenis, MD  silver sulfADIAZINE (SILVADENE) 1 % cream Apply 1 application daily topically. For wound care with dressing    [provider]  torsemide (DEMADEX) 20 MG tablet Take 40 mg 2 (two) times daily by mouth.     [provider]    Family History Family History  Problem Relation Age of Onset  . Diabetes Mother   . Alcohol abuse Father     Social History Social History   Tobacco Use  . Smoking status: Current Every Day Smoker    Packs/day: 1.00    Years: 40.00    Pack years: 40.00    Types: Cigarettes    Start date: 05/20/1968  . Smokeless tobacco: Never Used  Substance Use Topics  . Alcohol use: No    Alcohol/week: 0.0 oz  . Drug use: No     Allergies   Patient has no known allergies.   Review of Systems Review of Systems  Constitutional:       Per HPI, otherwise negative  HENT:       Per HPI, otherwise negative  Respiratory:       Per HPI, otherwise negative  Cardiovascular:       Per HPI, otherwise  negative  Gastrointestinal: Positive for nausea. Negative for vomiting.  Endocrine:       Negative aside from HPI  Genitourinary:       Neg aside from HPI   Musculoskeletal:       Per HPI, otherwise negative  Skin: Positive for wound.  Neurological: Positive for weakness. Negative for syncope.     Physical Exam Updated Vital Signs BP (!) 163/103   Pulse 68   Temp 97.6 F (36.4 C)   Resp 18   Ht 6\' 4"  (1.93 m)   Wt 106.6 kg (235 lb)   SpO2 95%   BMI 28.61  kg/m   Physical Exam  Constitutional: He is oriented to person, place, and time. He appears well-developed. No distress.  HENT:  Head: Normocephalic and atraumatic.  Eyes: Conjunctivae and EOM are normal.  Cardiovascular: Normal rate and regular rhythm.  Pulmonary/Chest: He has decreased breath sounds. He has wheezes.  Abdominal: He exhibits no distension.  Musculoskeletal: He exhibits no edema.       Right lower leg: He exhibits no edema.       Left lower leg: He exhibits no edema.  Left great toe status post amputation.  No proximal erythema, drainage, bleeding.  Neurological: He is alert and oriented to person, place, and time.  Skin: Skin is warm and dry.  Psychiatric: He has a normal mood and affect.  Nursing note and vitals reviewed.    ED Treatments / Results  Labs (all labs ordered are listed, but only abnormal results are displayed) Labs Reviewed  COMPREHENSIVE METABOLIC PANEL - Abnormal; Notable for the following components:      Result Value   Potassium 3.0 (*)    Glucose, Bld 148 (*)    BUN 58 (*)    Creatinine, Ser 3.24 (*)    Calcium 8.4 (*)    Albumin 2.9 (*)    AST 14 (*)    ALT 9 (*)    GFR calc non Af Amer 19 (*)    GFR calc Af Amer 22 (*)    All other components within normal limits  CBC WITH DIFFERENTIAL/PLATELET - Abnormal; Notable for the following components:   WBC 10.6 (*)    All other components within normal limits  TROPONIN I - Abnormal; Notable for the following components:     Troponin I 0.11 (*)    All other components within normal limits  BRAIN NATRIURETIC PEPTIDE - Abnormal; Notable for the following components:   B Natriuretic Peptide 293.0 (*)    All other components within normal limits    EKG  EKG Interpretation  Date/Time:  Friday May 09 2017 12:05:49 EST Ventricular Rate:  72 PR Interval:    QRS Duration: 103 QT Interval:  413 QTC Calculation: 452 R Axis:   58 Text Interpretation:  Sinus rhythm Borderline short PR interval Repol abnrm suggests ischemia, anterolateral Baseline wander in lead(s) II III aVF Confirmed by Orlie Dakin [phone]) on 05/09/2017 12:16:57 PM     On cardiac monitor the patient is sinus rhythm, rate variable, 70s, unremarkable Pulse oximetry is 97% room air unremarkable   Radiology Dg Chest 2 View  Result Date: 05/09/2017 CLINICAL DATA:  Cough, congestion, shortness of breath, and weakness. EXAM: CHEST  2 VIEW COMPARISON:  04/04/2016. FINDINGS: Normal heart size. Clear lung fields. Hyperinflation. Old RIGHT-sided rib fractures. No consolidation or edema. 8 x 10 mm noncalcified density seen best on the PA view, LEFT mid lung zone, could represent a pulmonary nodule. CT of the chest with contrast is recommended for further evaluation. No osseous findings. Compared with prior exam, the nodule is not definitely present. IMPRESSION: 8 x 10 mm soft tissue density LEFT midlung zone requires further workup. In this patient with tobacco abuse disorder, CT of the chest with contrast recommended to exclude a pulmonary nodule. Hyperinflation without consolidation or edema. Old RIGHT-sided rib fractures. Electronically Signed   By: Staci Righter M.D.   On: 05/09/2017 13:43    Procedures Procedures (including critical care time)  Medications Ordered in ED Medications  nitroGLYCERIN (NITROSTAT) SL tablet 0.4 mg (0.4 mg Sublingual Given 05/09/17 1358)  potassium chloride 10 mEq in 100 mL IVPB (10 mEq Intravenous New Bag/Given  05/09/17 1354)  albuterol (PROVENTIL) (2.5 MG/3ML) 0.083% nebulizer solution 5 mg (5 mg Nebulization Given 05/09/17 1239)  methylPREDNISolone sodium succinate (SOLU-MEDROL) 125 mg/2 mL injection 125 mg (125 mg Intravenous Given 05/09/17 1244)     Initial Impression / Assessment and Plan / ED Course  I have reviewed the triage vital signs and the nursing notes.  Pertinent labs & imaging results that were available during my care of the patient were reviewed by me and considered in my medical decision making (see chart for details).  2:05 PM On repeat exam the patient is awake and alert. Pain is minimal, vital signs unremarkable, blood pressure 160/100, no hypoxia. I discussed initial findings with him and his wife, including slight elevation in troponin, elevated BNP, worsening renal function.  Subsequently I discussed patient's case with our cardiology colleagues. We reviewed the patient's chart, prior echocardiogram, prior catheterization, concern for combination of obstructive disease, history of congestive heart failure, and worsening renal function.  Patient's initial hypokalemia will be addressed with IV potassium. Some consideration that this may have precipitated a tachycardia episode earlier today, leading to his chest pain. Patient has received nitroglycerin as well. Given this patient's elevated troponin, history of A. fib/A flutter, worsening renal function, and evidence for some fluid overload status, the patient will be admitted for further evaluation and management. Cardiology follows a consulting team.  Initial recommendations for serial troponin, hold diuresis, manage electrolytes.  This elderly male with multiple medical issues including chronic kidney disease, obstructive coronary disease, COPD, active smoking, presents with chest pain after what seems to have been an episode of tachycardia. Troponin may be secondary to metabolic demand, though given his history of risk  factors, he will require monitoring. Patient is already on Eliquis, additional anticoagulant not currently indicated. Patient has received analgesia with nitroglycerin, as well as potassium repletion initiation prior to admission.     Final Clinical Impressions(s) / ED Diagnoses  Chest pain Hypokalemia Acute on chronic kidney dysfunction  CRITICAL CARE Performed by: Carmin Muskrat Total critical care time: 40 minutes Critical care time was exclusive of separately billable procedures and treating other patients. Critical care was necessary to treat or prevent imminent or life-threatening deterioration. Critical care was time spent personally by me on the following activities: development of treatment plan with patient and/or surrogate as well as nursing, discussions with consultants, evaluation of patient's response to treatment, examination of patient, obtaining history from patient or surrogate, ordering and performing treatments and interventions, ordering and review of laboratory studies, ordering and review of radiographic studies, pulse oximetry and re-evaluation of patient's condition.    Carmin Muskrat, MD 05/09/17 1409

## 2017-05-10 ENCOUNTER — Observation Stay (HOSPITAL_BASED_OUTPATIENT_CLINIC_OR_DEPARTMENT_OTHER): Payer: Medicare Other

## 2017-05-10 DIAGNOSIS — I5032 Chronic diastolic (congestive) heart failure: Secondary | ICD-10-CM | POA: Diagnosis not present

## 2017-05-10 DIAGNOSIS — I731 Thromboangiitis obliterans [Buerger's disease]: Secondary | ICD-10-CM | POA: Diagnosis not present

## 2017-05-10 DIAGNOSIS — N179 Acute kidney failure, unspecified: Secondary | ICD-10-CM | POA: Diagnosis not present

## 2017-05-10 DIAGNOSIS — I10 Essential (primary) hypertension: Secondary | ICD-10-CM | POA: Diagnosis not present

## 2017-05-10 DIAGNOSIS — E876 Hypokalemia: Secondary | ICD-10-CM | POA: Diagnosis not present

## 2017-05-10 DIAGNOSIS — I4892 Unspecified atrial flutter: Secondary | ICD-10-CM | POA: Diagnosis not present

## 2017-05-10 DIAGNOSIS — R0789 Other chest pain: Secondary | ICD-10-CM | POA: Diagnosis not present

## 2017-05-10 DIAGNOSIS — R079 Chest pain, unspecified: Secondary | ICD-10-CM | POA: Diagnosis not present

## 2017-05-10 DIAGNOSIS — F17200 Nicotine dependence, unspecified, uncomplicated: Secondary | ICD-10-CM

## 2017-05-10 DIAGNOSIS — J438 Other emphysema: Secondary | ICD-10-CM

## 2017-05-10 DIAGNOSIS — N184 Chronic kidney disease, stage 4 (severe): Secondary | ICD-10-CM | POA: Diagnosis not present

## 2017-05-10 LAB — ECHOCARDIOGRAM COMPLETE
E decel time: 232 msec
E/e' ratio: 17.5
FS: 44 % (ref 28–44)
Height: 76 in
IVS/LV PW RATIO, ED: 1.13
LA ID, A-P, ES: 44 mm
LA diam end sys: 44 mm
LA diam index: 1.83 cm/m2
LA vol A4C: 94.6 ml
LA vol index: 42 mL/m2
LA vol: 101 mL
LV E/e' medial: 17.5
LV E/e'average: 17.5
LV PW d: 14.2 mm — AB (ref 0.6–1.1)
LV dias vol index: 65 mL/m2
LV dias vol: 156 mL — AB (ref 62–150)
LV e' LATERAL: 5.77 cm/s
LV sys vol index: 22 mL/m2
LV sys vol: 54 mL
LVOT SV: 101 mL
LVOT VTI: 29.2 cm
LVOT area: 3.46 cm2
LVOT diameter: 21 mm
LVOT peak grad rest: 7 mmHg
LVOT peak vel: 132 cm/s
Lateral S' vel: 15.2 cm/s
MV Dec: 232
MV Peak grad: 4 mmHg
MV pk A vel: 85.4 m/s
MV pk E vel: 101 m/s
Simpson's disk: 66
Stroke v: 102 ml
TAPSE: 27.2 mm
TDI e' lateral: 5.77
TDI e' medial: 5.87
Weight: 3758.4 oz

## 2017-05-10 LAB — BASIC METABOLIC PANEL
Anion gap: 12 (ref 5–15)
BUN: 61 mg/dL — ABNORMAL HIGH (ref 6–20)
CO2: 20 mmol/L — ABNORMAL LOW (ref 22–32)
Calcium: 8.1 mg/dL — ABNORMAL LOW (ref 8.9–10.3)
Chloride: 101 mmol/L (ref 101–111)
Creatinine, Ser: 3.12 mg/dL — ABNORMAL HIGH (ref 0.61–1.24)
GFR calc Af Amer: 23 mL/min — ABNORMAL LOW (ref >60)
GFR calc non Af Amer: 20 mL/min — ABNORMAL LOW (ref >60)
Glucose, Bld: 226 mg/dL — ABNORMAL HIGH (ref 65–99)
Potassium: 3.7 mmol/L (ref 3.5–5.1)
Sodium: 133 mmol/L — ABNORMAL LOW (ref 135–145)

## 2017-05-10 LAB — GLUCOSE, CAPILLARY
Glucose-Capillary: 208 mg/dL — ABNORMAL HIGH (ref 65–99)
Glucose-Capillary: 185 mg/dL — ABNORMAL HIGH (ref 65–99)

## 2017-05-10 LAB — TROPONIN I
Troponin I: 0.44 ng/mL (ref <0.03)
Troponin I: 0.37 ng/mL (ref <0.03)

## 2017-05-10 MED ORDER — POTASSIUM CHLORIDE CRYS ER 20 MEQ PO TBCR: 40.0000 meq | EXTENDED_RELEASE_TABLET | Freq: Two times a day (BID) | ORAL | 0 refills | Status: DC

## 2017-05-10 MED ORDER — POTASSIUM CHLORIDE CRYS ER 20 MEQ PO TBCR: 20.0000 meq | EXTENDED_RELEASE_TABLET | Freq: Every day | ORAL | 0 refills | Status: DC

## 2017-05-10 MED ORDER — TORSEMIDE 20 MG PO TABS: 40.0000 mg | ORAL_TABLET | Freq: Every day | ORAL | 0 refills | Status: DC

## 2017-05-10 MED ORDER — POTASSIUM CHLORIDE CRYS ER 20 MEQ PO TBCR: 40.0000 meq | EXTENDED_RELEASE_TABLET | Freq: Every day | ORAL | 0 refills | Status: DC

## 2017-05-10 NOTE — Progress Notes (Signed)
*  PRELIMINARY RESULTS* Echocardiogram 2D Echocardiogram has been performed.  [name] 05/10/2017, 1:36 PM

## 2017-05-10 NOTE — Discharge Summary (Addendum)
Physician Discharge Summary  [name] GEX:[id number] DOB: [birth year] DOA: 05/09/2017  PCP: [name], MD  Admit date: 05/09/2017 Discharge date: 05/10/2017  Admitted From: Home Disposition:  Home  Recommendations for Outpatient Follow-up:  1. Follow up with PCP in 1-2 weeks 2. Please obtain BMP/CBC in one week   Discharge Condition: Stable CODE STATUS: FULL Diet recommendation: Heart Healthy / Carb Modified /   Brief/Interim Summary: 66 year old male with a history of COPD, tobacco abuse, CKD stage IV, diabetes mellitus, coronary artery disease, Buerger's disease, atrial flutter on apixaban, and diastolic CHF presenting with 3-4-day history of chest discomfort and intermittent shortness of breath.  He states that it has been happening more frequently when he has been feeling a fluttering sensation is in his chest as well as his chest discomfort.  This usually happens when he is at rest; he denies any exertional chest discomfort.  He developed chest discomfort shortness of breath at 8:30 in the morning on May 09, 2017 he actually went to his podiatry appointment on May 09, 2017 after which he went to the emergency department.  The patient took some nitroglycerin which helped his chest discomfort.  Initial troponin was 0.11 which increased to 0.45-->0.44.  The patient had intermittent chest discomfort overnight on 05/09/2017 to 05/10/17.  Blood pressure in the emergency department was up to 163/103 the patient states that he has been poorly compliant with his cardiac medications and insulin up until this past week after he saw Dr. [name] in the office on May 02, 2017.    Discharge Diagnoses:  Chest discomfort/elevated troponin -Trend is flat  0.11-->0.45-->0.44-->0.37 -Echocardiogram--EF 60%, no WMA, severe LVH, trivial MR -Suspect the patient's worsening renal failure and uncontrolled hypertension may be contributing to his elevated troponin -instructed pt to  follow up with cardiology within one week  Acute on chronic renal failure CKD stage IV -Baseline creatinine 2.3-2.6 -Presenting creatinine 3.24 -discharge creatinine 3.12 -Suspect the patient has progression of his underlying renal disease secondary to uncontrolled hypertension and diabetes mellitus  -not much improvement with IV fluids x 24 hours -Holding torsemide during hospitalization -restart torsemide 40 mg once daily (BID before admission) after discharge -will not restart ramipril after discharge  Chronic diastolic CHF -Clinically compensated -Daily weights -May 02, 2017 weight at Dr. Nelly Laurence office--245 pounds  COPD/tobacco abuse -Stable on room air without exacerbation -Tobacco cessation discussed -Nearly 100-pack-year history  Essential hypertension, uncontrolled -Has been poorly compliant with medications -Continue carvedilol and hydralazine  Diabetes mellitus type 2 -April 14, 2017 hemoglobin A1c 5.8 -CBGs elevated in the last 24 hours secondary to Solu-Medrol -Continue Lantus -continue SSI  Atrial flutter  -presently in sinus rhythm -Personally reviewed telemetry--remained in sinus rhythm through the hospitalization  Hyperlipidemia -Continue statin  Lung Nodule/opacity -Outpatient CT chest -pt made aware  TOTAL TIME 65 MIN--1200 TO 1240 AND 1230 TO 1450    Discharge Instructions   Allergies as of 05/10/2017   No Known Allergies     Medication List    STOP taking these medications   ramipril 10 MG tablet Commonly known as:  ALTACE     TAKE these medications   acetaminophen 325 MG tablet Commonly known as:  TYLENOL Take 975 mg daily by mouth. 3 tablets every morning   apixaban 5 MG Tabs tablet Commonly known as:  ELIQUIS Take 1 tablet (5 mg total) by mouth 2 (two) times daily.   carvedilol 12.5 MG tablet Commonly known as:  COREG Take 1.5 tablets (18.75 mg total)  by mouth 2 (two) times daily with a meal.     hydrALAZINE 100 MG tablet Commonly known as:  APRESOLINE TAKE ONE TABLET BY MOUTH THREE TIMES DAILY   LANTUS 100 UNIT/ML injection Generic drug:  insulin glargine Inject 35 Units daily after breakfast into the skin.   LINZESS 290 MCG Caps capsule Generic drug:  linaclotide Take 290 mcg by mouth daily as needed (for constipation).   multivitamin tablet Take 1 tablet by mouth daily.   omeprazole 20 MG capsule Commonly known as:  PRILOSEC Take 20 mg daily by mouth.   potassium chloride SA 20 MEQ tablet Commonly known as:  K-DUR,KLOR-CON Take 1 tablet (20 mEq total) by mouth daily.   rosuvastatin 20 MG tablet Commonly known as:  CRESTOR TAKE ONE TABLET BY MOUTH DAILY   silver sulfADIAZINE 1 % cream Commonly known as:  SILVADENE Apply 1 application daily topically. For wound care with dressing   torsemide 20 MG tablet Commonly known as:  DEMADEX Take 2 tablets (40 mg total) by mouth daily. What changed:  when to take this   VENTOLIN HFA 108 (90 Base) MCG/ACT inhaler Generic drug:  albuterol Inhale 2 puffs into the lungs every 6 (six) hours as needed for wheezing or shortness of breath.      Follow-up Information    Arnoldo Lenis, MD Follow up in 1 week(s).   Specialty:  Cardiology Contact information: [address] [place] [address] [phone]        [name], MD Follow up.   Specialty:  Family Medicine Why:  Call office for 1 week appointment time. Contact information: [place] [address] [phone]          No Known Allergies  Consultations:  none   Procedures/Studies: Dg Chest 2 View  Result Date: 05/09/2017 CLINICAL DATA:  Cough, congestion, shortness of breath, and weakness. EXAM: CHEST  2 VIEW COMPARISON:  04/04/2016. FINDINGS: Normal heart size. Clear lung fields. Hyperinflation. Old RIGHT-sided rib fractures. No consolidation or edema. 8 x 10 mm noncalcified density seen best on the PA view, LEFT mid lung  zone, could represent a pulmonary nodule. CT of the chest with contrast is recommended for further evaluation. No osseous findings. Compared with prior exam, the nodule is not definitely present. IMPRESSION: 8 x 10 mm soft tissue density LEFT midlung zone requires further workup. In this patient with tobacco abuse disorder, CT of the chest with contrast recommended to exclude a pulmonary nodule. Hyperinflation without consolidation or edema. Old RIGHT-sided rib fractures. Electronically Signed   By: Staci Righter M.D.   On: 05/09/2017 13:43   Dg Foot Complete Left  Result Date: 04/23/2017 CLINICAL DATA:  Status post surgery. EXAM: LEFT FOOT - COMPLETE 3+ VIEW COMPARISON:  04/14/2017 FINDINGS: No acute fracture or dislocation. 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 Prior arthrodesis of the first IP joint transfixed with a single cannulated screw. Prior amputation of the second middle and distal phalanx. Small amount of soft tissue air adjacent to the first phalanx consistent with recent surgery. IMPRESSION: No acute osseous injury of the left foot. Small amount air adjacent to the first phalanx consistent with postsurgical changes. Electronically Signed   By: Kathreen Devoid   On: 04/23/2017 09:37   Dg Foot Complete Left  Result Date: 04/15/2017 CLINICAL DATA:  Ulcer left great toe. EXAM: LEFT FOOT - COMPLETE 3+ VIEW COMPARISON:  Prior report  03/14/2011. FINDINGS: Prior fusion of the first interphalangeal joint. Prior amputation of the distal phalanx of the left second digit and the left fifth metatarsal. Diffuse degenerative change . No acute bony abnormality identified . Soft tissue swelling noted over the left first metatarsophalangeal joint. No foreign body. Soft tissue swelling noted over the ankle. Venous calcifications in the lower extremity. IMPRESSION: 1. Prior fusion of the first interphalangeal joint. Prior amputation of the  distal phalanx of the left second digit and left fifth metatarsal. Diffuse degenerative change. No acute bony abnormality. 2. Soft tissue swelling noted the left first metatarsophalangeal joint. No radiopaque foreign body. 3. Soft tissue swelling of the ankle with associated venous calcifications. These findings suggest venous stasis. Electronically Signed   By: Marcello Moores  Register   On: 04/15/2017 07:35        Discharge Exam: Vitals:   05/10/17 0900 05/10/17 1300  BP: (!) 151/64 (!) 178/75  Pulse: 61 61  Resp: 18 20  Temp: 97.9 F (36.6 C) 98 F (36.7 C)  SpO2: 94% 97%   Vitals:   05/10/17 0103 05/10/17 0505 05/10/17 0900 05/10/17 1300  BP: (!) 150/68 (!) 144/67 (!) 151/64 (!) 178/75  Pulse: 69 62 61 61  Resp: 20 20 18 20   Temp: 97.6 F (36.4 C) 97.8 F (36.6 C) 97.9 F (36.6 C) 98 F (36.7 C)  TempSrc: Oral Oral Oral Oral  SpO2: 94% 92% 94% 97%  Weight:      Height:        General: Pt is alert, awake, not in acute distress Cardiovascular: RRR, S1/S2 +, no rubs, no gallops Respiratory: CTA bilaterally, no wheezing, no rhonchi Abdominal: Soft, NT, ND, bowel sounds + Extremities: no edema, no cyanosis   The results of significant diagnostics from this hospitalization (including imaging, microbiology, ancillary and laboratory) are listed below for reference.    Significant Diagnostic Studies: Dg Chest 2 View  Result Date: 05/09/2017 CLINICAL DATA:  Cough, congestion, shortness of breath, and weakness. EXAM: CHEST  2 VIEW COMPARISON:  04/04/2016. FINDINGS: Normal heart size. Clear lung fields. Hyperinflation. Old RIGHT-sided rib fractures. No consolidation or edema. 8 x 10 mm noncalcified density seen best on the PA view, LEFT mid lung zone, could represent a pulmonary nodule. CT of the chest with contrast is recommended for further evaluation. No osseous findings. Compared with prior exam, the nodule is not definitely present. IMPRESSION: 8 x 10 mm soft tissue density LEFT  midlung zone requires further workup. In this patient with tobacco abuse disorder, CT of the chest with contrast recommended to exclude a pulmonary nodule. Hyperinflation without consolidation or edema. Old RIGHT-sided rib fractures. Electronically Signed   By: Staci Righter M.D.   On: 05/09/2017 13:43   Dg Foot Complete Left  Result Date: 04/23/2017 CLINICAL DATA:  Status post surgery. EXAM: LEFT FOOT - COMPLETE 3+ VIEW COMPARISON:  04/14/2017 FINDINGS: No acute fracture or dislocation. 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 Prior arthrodesis of the first IP joint transfixed with a single cannulated screw. Prior amputation of the second middle and distal phalanx. Small amount of soft tissue air adjacent to the first phalanx consistent with recent surgery. IMPRESSION: No acute osseous injury of the left foot. Small amount air adjacent to the first phalanx consistent with postsurgical changes. Electronically Signed   By: Kathreen Devoid   On: 04/23/2017 09:37   Dg Foot Complete Left  Result Date: 04/15/2017 CLINICAL DATA:  Ulcer left great toe. EXAM: LEFT FOOT - COMPLETE 3+ VIEW COMPARISON:  Prior report 03/14/2011. FINDINGS: Prior fusion of the first interphalangeal joint. Prior amputation of the distal phalanx of the left second digit and the left fifth metatarsal. Diffuse degenerative change . No acute bony abnormality identified . Soft tissue swelling noted over the left first metatarsophalangeal joint. No foreign body. Soft tissue swelling noted over the ankle. Venous calcifications in the lower extremity. IMPRESSION: 1. Prior fusion of the first interphalangeal joint. Prior amputation of the distal phalanx of the left second digit and left fifth metatarsal. Diffuse degenerative change. No acute bony abnormality. 2. Soft tissue swelling noted the left first metatarsophalangeal joint. No radiopaque foreign body. 3. Soft tissue  swelling of the ankle with associated venous calcifications. These findings suggest venous stasis. Electronically Signed   By: Marcello Moores  Register   On: 04/15/2017 07:35     Microbiology: No results found for this or any previous visit (from the past 240 hour(s)).   Labs: Basic Metabolic Panel: Recent Labs  Lab 05/09/17 1227 05/10/17 0602  NA 137 133*  K 3.0* 3.7  CL 101 101  CO2 24 20*  GLUCOSE 148* 226*  BUN 58* 61*  CREATININE 3.24* 3.12*  CALCIUM 8.4* 8.1*   Liver Function Tests: Recent Labs  Lab 05/09/17 1227  AST 14*  ALT 9*  ALKPHOS 50  BILITOT 0.4  PROT 6.7  ALBUMIN 2.9*   No results for input(s): LIPASE, AMYLASE in the last 168 hours. No results for input(s): AMMONIA in the last 168 hours. CBC: Recent Labs  Lab 05/09/17 1227  WBC 10.6*  NEUTROABS 7.3  HGB 14.7  HCT 44.6  MCV 96.5  PLT 293   Cardiac Enzymes: Recent Labs  Lab 05/09/17 1227 05/09/17 1550 05/09/17 2101 05/10/17 1331  TROPONINI 0.11* 0.45* 0.44* 0.37*   BNP: Invalid input(s): POCBNP CBG: Recent Labs  Lab 05/09/17 2130 05/09/17 2142 05/10/17 0730 05/10/17 1105  GLUCAP 480* 470* 208* 185*    Time coordinating discharge:  Greater than 30 minutes  Signed:  Orson Eva, DO [facility] Pager: [phone] 05/10/2017, 4:30 PM

## 2017-05-10 NOTE — Progress Notes (Signed)
PROGRESS NOTE  [name] TMA:[id number] DOB: 07-Aug-1950 DOA: 05/09/2017 PCP: [name], MD  Brief History:  66 year old male with a history of COPD, tobacco abuse, CKD stage IV, diabetes mellitus, coronary artery disease, Buerger's disease, atrial flutter on apixaban, and diastolic CHF presenting with 3-4-day history of chest discomfort and intermittent shortness of breath.  He states that it has been happening more frequently when he has been feeling a fluttering sensation is in his chest as well as his chest discomfort.  This usually happens when he is at rest; he denies any exertional chest discomfort.  He developed chest discomfort shortness of breath at 8:30 in the morning on May 09, 2017 he actually went to his podiatry appointment on May 09, 2017 after which he went to the emergency department.  The patient took some nitroglycerin which helped his chest discomfort.  Initial troponin was 0.11 which increased to 0.45-->0.44.  The patient had intermittent chest discomfort overnight on 05/09/2017 to 05/10/17.  Blood pressure in the emergency department was up to 163/103 the patient states that he has been poorly compliant with.  His cardiac medications and insulin up until this past week after he saw Dr. [name] in the office on May 02, 2017.  Assessment/Plan: Chest discomfort/elevated troponin -Appears to be flat trend -Repeat troponin -Echocardiogram -Suspect the patient's worsening renal failure and uncontrolled hypertension may be contributing to his elevated troponin  Acute on chronic renal failure CKD stage IV -Baseline creatinine 2.3-2.6 -Presenting creatinine 3.24 -Suspect the patient has progression of his underlying renal disease secondary to uncontrolled hypertension and diabetes mellitus  -not much improvement with IV fluids over 24 hours -Holding torsemide during hospitalization  Chronic diastolic CHF -Clinically compensated -Daily  weights -May 02, 2017 weight at Dr. Nelly Laurence office--245 pounds  COPD/tobacco abuse -Stable on room air without exacerbation -Tobacco cessation discussed -Nearly 100-pack-year history  Essential hypertension, uncontrolled -Has been poorly compliant with medications -Continue carvedilol and hydralazine  Diabetes mellitus type 2 -April 14, 2017 hemoglobin A1c 5.8 -CBGs elevated in the last 24 hours secondary to Solu-Medrol -Continue Lantus -continue SSI  Atrial flutter  -presently in sinus rhythm -Personally reviewed telemetry--remained in sinus rhythm overnight  Hyperlipidemia -Continue statin  Lung Nodule/opacity -Outpatient CT chest -pt made aware   Disposition Plan:   Home if cardiac work up re-assuring Family Communication:   Spouse updated at bedside 12/22--Total time spent 45 minutes.  Greater than 50% spent face to face counseling and coordinating care.   Consultants: none   Code Status:  FULL   DVT Prophylaxis:   [place] Lovenox   Procedures: As Listed in Progress Note Above  Antibiotics: None    Subjective: Patient denies fevers, chills, headache, chest pain, dyspnea, nausea, vomiting, diarrhea, abdominal pain, dysuria, hematuria, hematochezia, and melena.   Objective: Vitals:   05/09/17 1703 05/09/17 2103 05/10/17 0103 05/10/17 0505  BP: (!) 143/122 (!) 142/63 (!) 150/68 (!) 144/67  Pulse: 73 70 69 62  Resp:  20 20 20   Temp: 98.4 F (36.9 C) 97.8 F (36.6 C) 97.6 F (36.4 C) 97.8 F (36.6 C)  TempSrc: Oral Oral Oral Oral  SpO2: 98% 94% 94% 92%  Weight: 106.5 kg (234 lb 14.4 oz)     Height: 6\' 4"  (1.93 m)       Intake/Output Summary (Last 24 hours) at 05/10/2017 1258 Last data filed at 05/10/2017 0800 Gross per 24 hour  Intake 1342.5 ml  Output -  Net 1342.5 ml   Weight change:  Exam:   General:  Pt is alert, follows commands appropriately, not in acute distress  HEENT: No icterus, No thrush, No neck mass,  [place]/AT  Cardiovascular: RRR, S1/S2, no rubs, no gallops  Respiratory: CTA bilaterally, no wheezing, no crackles, no rhonchi  Abdomen: Soft/+BS, non tender, non distended, no guarding  Extremities: No edema, No lymphangitis, No petechiae, No rashes, no synovitis   Data Reviewed: I have personally reviewed following labs and imaging studies Basic Metabolic Panel: Recent Labs  Lab 05/09/17 1227 05/10/17 0602  NA 137 133*  K 3.0* 3.7  CL 101 101  CO2 24 20*  GLUCOSE 148* 226*  BUN 58* 61*  CREATININE 3.24* 3.12*  CALCIUM 8.4* 8.1*   Liver Function Tests: Recent Labs  Lab 05/09/17 1227  AST 14*  ALT 9*  ALKPHOS 50  BILITOT 0.4  PROT 6.7  ALBUMIN 2.9*   No results for input(s): LIPASE, AMYLASE in the last 168 hours. No results for input(s): AMMONIA in the last 168 hours. Coagulation Profile: No results for input(s): INR, PROTIME in the last 168 hours. CBC: Recent Labs  Lab 05/09/17 1227  WBC 10.6*  NEUTROABS 7.3  HGB 14.7  HCT 44.6  MCV 96.5  PLT 293   Cardiac Enzymes: Recent Labs  Lab 05/09/17 1227 05/09/17 1550 05/09/17 2101  TROPONINI 0.11* 0.45* 0.44*   BNP: Invalid input(s): POCBNP CBG: Recent Labs  Lab 05/09/17 2130 05/09/17 2142 05/10/17 0730 05/10/17 1105  GLUCAP 480* 470* 208* 185*   HbA1C: No results for input(s): HGBA1C in the last 72 hours. Urine analysis:    Component Value Date/Time   COLORURINE YELLOW 04/04/2016 1500   APPEARANCEUR CLEAR 04/04/2016 1500   LABSPEC 1.010 04/04/2016 1500   PHURINE 7.0 04/04/2016 1500   GLUCOSEU NEGATIVE 04/04/2016 1500   HGBUR MODERATE (A) 04/04/2016 1500   BILIRUBINUR NEGATIVE 04/04/2016 1500   KETONESUR NEGATIVE 04/04/2016 1500   PROTEINUR >300 (A) 04/04/2016 1500   UROBILINOGEN 0.2 09/27/2014 0027   NITRITE NEGATIVE 04/04/2016 1500   LEUKOCYTESUR NEGATIVE 04/04/2016 1500   Sepsis Labs: @LABRCNTIP (procalcitonin:4,lacticidven:4) )No results found for this or any previous visit (from the  past 240 hour(s)).   Scheduled Meds: . apixaban  5 mg Oral BID  . carvedilol  18.75 mg Oral BID WC  . hydrALAZINE  100 mg Oral TID  . insulin aspart  0-15 Units Subcutaneous TID WC  . insulin glargine  35 Units Subcutaneous QPC breakfast  . pantoprazole  40 mg Oral Daily  . potassium chloride  40 mEq Oral BID  . rosuvastatin  20 mg Oral Daily  . silver sulfADIAZINE  1 application Topical Daily   Continuous Infusions:  Procedures/Studies: Dg Chest 2 View  Result Date: 05/09/2017 CLINICAL DATA:  Cough, congestion, shortness of breath, and weakness. EXAM: CHEST  2 VIEW COMPARISON:  04/04/2016. FINDINGS: Normal heart size. Clear lung fields. Hyperinflation. Old RIGHT-sided rib fractures. No consolidation or edema. 8 x 10 mm noncalcified density seen best on the PA view, LEFT mid lung zone, could represent a pulmonary nodule. CT of the chest with contrast is recommended for further evaluation. No osseous findings. Compared with prior exam, the nodule is not definitely present. IMPRESSION: 8 x 10 mm soft tissue density LEFT midlung zone requires further workup. In this patient with tobacco abuse disorder, CT of the chest with contrast recommended to exclude a pulmonary nodule. Hyperinflation without consolidation or edema. Old RIGHT-sided rib fractures. Electronically Signed   By:  Staci Righter M.D.   On: 05/09/2017 13:43   Dg Foot Complete Left  Result Date: 04/23/2017 CLINICAL DATA:  Status post surgery. EXAM: LEFT FOOT - COMPLETE 3+ VIEW COMPARISON:  04/14/2017 FINDINGS: No acute fracture or dislocation. 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 Prior arthrodesis of the first IP joint transfixed with a single cannulated screw. Prior amputation of the second middle and distal phalanx. Small amount of soft tissue air adjacent to the first phalanx consistent with recent surgery. IMPRESSION: No acute osseous injury of the left  foot. Small amount air adjacent to the first phalanx consistent with postsurgical changes. Electronically Signed   By: Kathreen Devoid   On: 04/23/2017 09:37   Dg Foot Complete Left  Result Date: 04/15/2017 CLINICAL DATA:  Ulcer left great toe. EXAM: LEFT FOOT - COMPLETE 3+ VIEW COMPARISON:  Prior report 03/14/2011. FINDINGS: Prior fusion of the first interphalangeal joint. Prior amputation of the distal phalanx of the left second digit and the left fifth metatarsal. Diffuse degenerative change . No acute bony abnormality identified . Soft tissue swelling noted over the left first metatarsophalangeal joint. No foreign body. Soft tissue swelling noted over the ankle. Venous calcifications in the lower extremity. IMPRESSION: 1. Prior fusion of the first interphalangeal joint. Prior amputation of the distal phalanx of the left second digit and left fifth metatarsal. Diffuse degenerative change. No acute bony abnormality. 2. Soft tissue swelling noted the left first metatarsophalangeal joint. No radiopaque foreign body. 3. Soft tissue swelling of the ankle with associated venous calcifications. These findings suggest venous stasis. Electronically Signed   By: Marcello Moores  Register   On: 04/15/2017 07:35    Orson Eva, DO  [facility] Pager [phone]  If 7PM-7AM, please contact night-coverage [email or web address] Password [email or web address] 05/10/2017, 12:58 PM   LOS: 0 days

## 2017-05-10 NOTE — Progress Notes (Signed)
IV discontinued,catheter intact. Discharge instructions given on medications,and follow up visits,patient verbalized understanding. Prescriptions sent with patient. Staff to accompany patient to an awaiting vehicle.

## 2017-05-12 ENCOUNTER — Telehealth: Payer: Self-pay | Admitting: Cardiology

## 2017-05-12 ENCOUNTER — Encounter: Payer: Self-pay | Admitting: *Deleted

## 2017-05-12 NOTE — Telephone Encounter (Signed)
Patient last seen by Dr. [name] on 05/02/17.  Please advise if needs stress test prior to OV on 05/30/2017.

## 2017-05-12 NOTE — Telephone Encounter (Signed)
Patient called stating that he was recently discharged from [facility]. Was told that he needs a follow up in one week with Dr. [name] because "something was going on with his heart" Appointment has been set up for 05-30-17 with B.Strader in [place]. Patient is concerned that he might need another stress test.

## 2017-05-12 NOTE — Telephone Encounter (Signed)
I would have him evaluated in our office first. This can be definitively determined at that time.

## 2017-05-14 NOTE — Telephone Encounter (Signed)
Patient notified.  Stated that he is seeing his pmd tomorrow as well.

## 2017-05-15 DIAGNOSIS — I4892 Unspecified atrial flutter: Secondary | ICD-10-CM | POA: Diagnosis not present

## 2017-05-15 DIAGNOSIS — Z6830 Body mass index (BMI) 30.0-30.9, adult: Secondary | ICD-10-CM | POA: Diagnosis not present

## 2017-05-15 DIAGNOSIS — R911 Solitary pulmonary nodule: Secondary | ICD-10-CM | POA: Diagnosis not present

## 2017-05-15 DIAGNOSIS — E1122 Type 2 diabetes mellitus with diabetic chronic kidney disease: Secondary | ICD-10-CM | POA: Diagnosis not present

## 2017-05-19 ENCOUNTER — Other Ambulatory Visit (HOSPITAL_COMMUNITY): Payer: Self-pay | Admitting: Family Medicine

## 2017-05-19 DIAGNOSIS — R911 Solitary pulmonary nodule: Secondary | ICD-10-CM

## 2017-05-29 ENCOUNTER — Ambulatory Visit (HOSPITAL_COMMUNITY)
Admission: RE | Admit: 2017-05-29 | Discharge: 2017-05-29 | Disposition: A | Discharge status: Home / Self Care | Payer: Medicare Other | Source: Ambulatory Visit | Attending: Family Medicine | Admitting: Family Medicine

## 2017-05-29 DIAGNOSIS — I700 Atherosclerosis of aorta: Secondary | ICD-10-CM | POA: Insufficient documentation

## 2017-05-29 DIAGNOSIS — R918 Other nonspecific abnormal finding of lung field: Secondary | ICD-10-CM | POA: Diagnosis not present

## 2017-05-29 DIAGNOSIS — I2510 Atherosclerotic heart disease of native coronary artery without angina pectoris: Secondary | ICD-10-CM | POA: Insufficient documentation

## 2017-05-29 DIAGNOSIS — R911 Solitary pulmonary nodule: Secondary | ICD-10-CM | POA: Diagnosis not present

## 2017-05-30 ENCOUNTER — Encounter: Payer: Self-pay | Admitting: Student

## 2017-05-30 ENCOUNTER — Encounter: Payer: Self-pay | Admitting: *Deleted

## 2017-05-30 ENCOUNTER — Ambulatory Visit (INDEPENDENT_AMBULATORY_CARE_PROVIDER_SITE_OTHER): Payer: Medicare Other | Admitting: Student

## 2017-05-30 VITALS — BP 172/88 | HR 64 | Ht 76.0 in | Wt 259.0 lb

## 2017-05-30 DIAGNOSIS — Q211 Atrial septal defect, unspecified: Secondary | ICD-10-CM

## 2017-05-30 DIAGNOSIS — N184 Chronic kidney disease, stage 4 (severe): Secondary | ICD-10-CM | POA: Diagnosis not present

## 2017-05-30 DIAGNOSIS — I2510 Atherosclerotic heart disease of native coronary artery without angina pectoris: Secondary | ICD-10-CM

## 2017-05-30 DIAGNOSIS — I10 Essential (primary) hypertension: Secondary | ICD-10-CM | POA: Diagnosis not present

## 2017-05-30 DIAGNOSIS — Z720 Tobacco use: Secondary | ICD-10-CM | POA: Diagnosis not present

## 2017-05-30 DIAGNOSIS — R0609 Other forms of dyspnea: Secondary | ICD-10-CM

## 2017-05-30 DIAGNOSIS — E782 Mixed hyperlipidemia: Secondary | ICD-10-CM

## 2017-05-30 DIAGNOSIS — I4892 Unspecified atrial flutter: Secondary | ICD-10-CM | POA: Diagnosis not present

## 2017-05-30 DIAGNOSIS — I5032 Chronic diastolic (congestive) heart failure: Secondary | ICD-10-CM | POA: Diagnosis not present

## 2017-05-30 DIAGNOSIS — R0600 Dyspnea, unspecified: Secondary | ICD-10-CM

## 2017-05-30 MED ORDER — CARVEDILOL 25 MG PO TABS: 25.0000 mg | ORAL_TABLET | Freq: Two times a day (BID) | ORAL | 3 refills | Status: DC

## 2017-05-30 NOTE — Patient Instructions (Signed)
Medication Instructions:  Your physician has recommended you make the following change in your medication:   Increase Coreg to 25 mg Two Times Daily  Increase Torsemide to 40 mg in the AM and 20 mg in the PM for 5 Days, then resume 40mg  Daily.  Take extra 20 mg if weight gain greater than 3 lbs overnight.    Labwork: NONE   Testing/Procedures: Your physician has requested that you have a lexiscan myoview. For further information please visit [email or web address]. Please follow instruction sheet, as given.    Follow-Up: Your physician recommends that you schedule a follow-up appointment in: 6 Weeks    Any Other Special Instructions Will Be Listed Below (If Applicable).     If you need a refill on your cardiac medications before your next appointment, please call your pharmacy.  Thank you for choosing [facility]!

## 2017-05-30 NOTE — Progress Notes (Signed)
Cardiology Office Note    Date:  05/30/2017   ID:  [name], [name] 1950-09-22, MRN [id number]  PCP:  [name], MD  Cardiologist: Dr. [name]  Chief Complaint  Patient presents with  . Hospitalization Follow-up    History of Present Illness:    [name] is a 67 y.o. male with past medical history of CAD (s/p prior stenting of RCA in 1999, cath in 2003 showing moderate CAD, NST in 2016 showing small area of ischemic and low-risk), paroxysmal atrial flutter (on Eliquis), HTN, HLD, Type 2 DM, Stage 4 CKD, COPD, and tobacco use who presents to the office today for hospital follow-up.   He was recently admitted to [facility] on 05/09/2017 for evaluation of chest pain which he described as a brick sitting on his chest with associated palpitations. Cyclic troponin values were obtained and at 0.11, 0.45, 0.44, and 0.37 during admission. Creatinine was acutely elevated to 3.24 (baseline 2.3 - 2.6) on admission but was trending down to 3.12 at the time of discharge. Echo showed a preserved EF of 60-65% with Grade 2 DD. His elevated troponin values were thought to be secondary to demand ischemia in the setting of his worsening renal failure and uncontrolled BP, therefore outpatient Cardiology follow-up was recommended.   In talking with the patient today, he reports having episodes of worsening dyspnea on exertion since his recent hospitalization. Says this is similar to when he required stent placement in the past. He denies any recurrent episodes of chest discomfort. Does have occasional palpitations which are most noticeable at night.  He reports good compliance with his medication regimen but reports blood pressure has been elevated when checked at home with SBP in 140's to 150's and DBP in 80's to 90's. BP is initially at 190/96 during today's visit, improved to 172/88 on recheck. He did just smoke a cigarette on the way to his office visit.   Past Medical History:  Diagnosis  Date  . Asthma   . Atrial flutter ([place])   . CAD (coronary artery disease)    a. s/p prior stenting of RCA in 1999 b. cath in 2003 showing moderate CAD c. NST in 2016 showing small area of ischemic and low-risk  . Chronic renal insufficiency   . COPD (chronic obstructive pulmonary disease) ([place])    with ongoing tobacco use and patient failed sham takes  . DM2 (diabetes mellitus, type 2) ([place])   . Dyslipidemia   . Edema    chronic lower extremity secondary to right heart failure and chronic venous insufficiency  . Headache   . HTN (hypertension)   . Morbid obesity ([place])   . Pneumonia   . 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   Marland Kitchen Reflux esophagitis    hx  . Renal insufficiency   . Sleep apnea   . Two-vessel coronary artery disease    moderate. by cath in 2003. Status post stenting of the mdi RCA November 1999 normal left ventricular ejection fraction    Past Surgical History:  Procedure Laterality Date  . ABDOMINAL SURGERY    . APPENDECTOMY    . BACK SURGERY    . CARDIAC CATHETERIZATION     stent  . CHOLECYSTECTOMY    . CLOSED REDUCTION SHOULDER DISLOCATION    . diabetic ulcers    . GROIN EXPLORATION    . OSTECTOMY Left 04/23/2017   Procedure: OSTECTOMY LEFT GREAT TOE;  Surgeon:  [name], DPM;  Location: [facility];  Service: Podiatry;  Laterality: Left;  left great toe  . TUMOR REMOVAL     small intestine     Current Medications: Outpatient Medications Prior to Visit  Medication Sig Dispense Refill  . acetaminophen (TYLENOL) 325 MG tablet Take 975 mg daily by mouth. 3 tablets every morning    . albuterol (VENTOLIN HFA) 108 (90 BASE) MCG/ACT inhaler Inhale 2 puffs into the lungs every 6 (six) hours as needed for wheezing or shortness of breath.     Marland Kitchen apixaban (ELIQUIS) 5 MG TABS tablet Take 1 tablet (5 mg total) by mouth 2 (two) times daily. 60 tablet 3  . hydrALAZINE (APRESOLINE) 100 MG tablet TAKE ONE  TABLET BY MOUTH THREE TIMES DAILY 90 tablet 0  . insulin glargine (LANTUS) 100 UNIT/ML injection Inject 35 Units daily after breakfast into the skin.     Marland Kitchen LINZESS 290 MCG CAPS capsule Take 290 mcg by mouth daily as needed (for constipation).   6  . Multiple Vitamin (MULTIVITAMIN) tablet Take 1 tablet by mouth daily.      Marland Kitchen omeprazole (PRILOSEC) 20 MG capsule Take 20 mg daily by mouth.    . potassium chloride SA (K-DUR,KLOR-CON) 20 MEQ tablet Take 1 tablet (20 mEq total) by mouth daily. 30 tablet 0  . rosuvastatin (CRESTOR) 20 MG tablet TAKE ONE TABLET BY MOUTH DAILY 30 tablet 0  . silver sulfADIAZINE (SILVADENE) 1 % cream Apply 1 application daily topically. For wound care with dressing    . torsemide (DEMADEX) 20 MG tablet Take 2 tablets (40 mg total) by mouth daily. 60 tablet 0  . carvedilol (COREG) 12.5 MG tablet Take 1.5 tablets (18.75 mg total) by mouth 2 (two) times daily with a meal. 135 tablet 3   No facility-administered medications prior to visit.      Allergies:   Patient has no known allergies.   Social History   Socioeconomic History  . Marital status: Single    Spouse name: None  . Number of children: None  . Years of education: None  . Highest education level: None  Social Needs  . Financial resource strain: None  . Food insecurity - worry: None  . Food insecurity - inability: None  . Transportation needs - medical: None  . Transportation needs - non-medical: None  Occupational History  . None  Tobacco Use  . Smoking status: Current Every Day Smoker    Packs/day: 1.00    Years: 40.00    Pack years: 40.00    Types: Cigarettes    Start date: 05/20/1968  . Smokeless tobacco: Never Used  Substance and Sexual Activity  . Alcohol use: No    Alcohol/week: 0.0 oz  . Drug use: No  . Sexual activity: Yes  Other Topics Concern  . None  Social History Narrative   Patient continues to smoke.      Family History:  The patient's family history includes Alcohol abuse  in his father; Diabetes in his mother.   Review of Systems:   Please see the history of present illness.     General:  No chills, fever, night sweats or weight changes.  Cardiovascular:  No chest pain, edema, orthopnea, paroxysmal nocturnal dyspnea. Positive for palpitations and dyspnea on exertion.  Dermatological: No rash, lesions/masses Respiratory: No cough, dyspnea Urologic: No hematuria, dysuria Abdominal:   No nausea, vomiting, diarrhea, bright red blood per rectum, melena, or hematemesis Neurologic:  No visual changes, wkns, changes in  mental status. All other systems reviewed and are otherwise negative except as noted above.   Physical Exam:    VS:  BP (!) 172/88   Pulse 64   Ht 6\' 4"  (1.93 m)   Wt 259 lb (117.5 kg)   SpO2 98%   BMI 31.53 kg/m    General: Well developed, well nourished Caucasian male appearing in no acute distress. Head: Normocephalic, atraumatic, sclera non-icteric, no xanthomas, nares are without discharge.  Neck: No carotid bruits. JVD not elevated.  Lungs: Respirations regular and unlabored, without wheezes or rales.  Heart: Regular rate and rhythm. No S3 or S4.  No murmur, no rubs, or gallops appreciated. Abdomen: Soft, non-tender, non-distended with normoactive bowel sounds. No hepatomegaly. No rebound/guarding. No obvious abdominal masses. Msk:  Strength and tone appear normal for age. No joint deformities or effusions. Extremities: No clubbing or cyanosis. 1+ pitting edema bilaterally.  Distal pedal pulses are 2+ bilaterally. Neuro: Alert and oriented X 3. Moves all extremities spontaneously. No focal deficits noted. Psych:  Responds to questions appropriately with a normal affect. Skin: No rashes or lesions noted  Wt Readings from Last 3 Encounters:  05/30/17 259 lb (117.5 kg)  05/09/17 234 lb 14.4 oz (106.5 kg)  05/02/17 245 lb (111.1 kg)     Studies/Labs Reviewed:   EKG:  EKG is not ordered today.    Recent Labs: 05/09/2017: ALT 9; B  Natriuretic Peptide 293.0; Hemoglobin 14.7; Platelets 293 05/10/2017: BUN 61; Creatinine, Ser 3.12; Potassium 3.7; Sodium 133   Lipid Panel No results found for: CHOL, TRIG, HDL, CHOLHDL, VLDL, LDLCALC, LDLDIRECT  Additional studies/ records that were reviewed today include:   Echocardiogram: 05/10/2017 Study Conclusions  - Left ventricle: The cavity size was normal. Wall thickness was   increased in a pattern of severe LVH. Systolic function was   normal. The estimated ejection fraction was in the range of 60%   to 65%. Wall motion was normal; there were no regional wall   motion abnormalities. Features are consistent with a pseudonormal   left ventricular filling pattern, with concomitant abnormal   relaxation and increased filling pressure (grade 2 diastolic   dysfunction). - Mitral valve: Calcified annulus. Mildly thickened leaflets . - Left atrium: The atrium was mildly to moderately dilated.  Impressions:  - Normal LV systolic function; severe LVH; moderate diastolic   dysfunction; mild to moderate LAE.   NST: 10/2014  Unable to adequately ambulate to treadmill due to right hip pain. Lexiscan employed.  Defect 1: There is a small defect of mild severity present in the mid inferior and apical inferior location. This defect is partially reversible.  This is a low risk study.  Nuclear stress EF: 58%.  Small region of apical inferior ischemia.  Assessment:    1. Coronary artery disease involving native coronary artery of native heart without angina pectoris   2. Dyspnea on exertion   3. Chronic diastolic heart failure (HCC)   4. Paroxysmal atrial flutter ([place])   5. Essential hypertension   6. Mixed hyperlipidemia   7. CKD (chronic kidney disease) stage 4, GFR 15-29 ml/min ([facility])   8. Tobacco use      Plan:   In order of problems listed above:  1.CAD/ Dyspnea on Exertion - the patient has known CAD as he had prior stenting of RCA in 1999, cath in 2003  showing moderate CAD and most recent ischemic evaluation was a NST in 2016 showing small area of ischemic and low-risk. - recently  admitted for chest pain and found to have peak troponin values of 0.45. Echo showed a preserved EF of 60-65% with Grade 2 DD.  - he denies any repeat episodes of chest pain but has continued to experience dyspnea on exertion which is his prior anginal symptom. Will perform repeat Lexiscan Myoview (patient unable to walk on treadmill due to balance issues secondary to toe amputations). We reviewed he would not be a catheterization candidate at this time in the setting of his Stage 4 CKD due to the significant risk of contrast-induced nephropathy and medical therapy would be pursued.  - continue BB and statin therapy. No ASA secondary to the need for Eliquis.   2. Chronic Diastolic CHF - echo during recent admission showed EF of 60-65% with Grade 2 DD.  - he does have pitting edema on examination but lungs are clear. Has noticed a 10+ lb weight gain since hospital discharge. Will increase Torsemide to 40mg  AM/20mg  in PM for next 5 days then resume 40mg  daily. Sodium and fluid restriction reviewed.   3. Paroxysmal Atrial Flutter - he has noticed episodes of palpitations at night. Will increase Coreg to 25mg  BID for improved HR and BP control. - he denies any evidence of active bleeding. Continue Eliquis for anticoagulation.   4. HTN - BP significantly elevated to 190/96 initially, slightly improved to 172/88 on recheck. Reports having smoked a cigarette prior to walking into the office.  - will increase Coreg to 25mg  BID. Continue Hydralazine 100mg  TID. Consider addition of Amlodipine if BP remains elevated.   5. HLD - followed by PCP. Goal LDL is < 70 with known CAD.  - remains on Crestor 20mg  daily.   6. Stage 4 CKD - creatinine elevated to 3.12 on most recent check. Followed by Dr. Lowanda Foster.   7. Tobacco Use - cessation advised. No intention of quitting at this  time.    Medication Adjustments/Labs and Tests Ordered: Current medicines are reviewed at length with the patient today.  Concerns regarding medicines are outlined above.  Medication changes, Labs and Tests ordered today are listed in the Patient Instructions below. Patient Instructions  Medication Instructions:  Your physician has recommended you make the following change in your medication:   Increase Coreg to 25 mg Two Times Daily  Increase Torsemide to 40 mg in the AM and 20 mg in the PM for 5 Days, then resume 40mg  Daily.  Take extra 20 mg if weight gain greater than 3 lbs overnight.    Labwork: NONE   Testing/Procedures: Your physician has requested that you have a lexiscan myoview. For further information please visit [email or web address]. Please follow instruction sheet, as given.  Follow-Up: Your physician recommends that you schedule a follow-up appointment in: 6 Weeks   Any Other Special Instructions Will Be Listed Below (If Applicable).  If you need a refill on your cardiac medications before your next appointment, please call your pharmacy.  Thank you for choosing [facility]!    Signed, Erma Heritage, PA-C  05/30/2017 9:32 PM    [facility] [address] [place], [place] [address] Phone: [phone]

## 2017-06-02 NOTE — Addendum Note (Signed)
Addended by: Levonne Hubert on: 06/02/2017 01:33 PM   Modules accepted: Orders

## 2017-06-03 ENCOUNTER — Encounter (HOSPITAL_COMMUNITY)
Admission: RE | Admit: 2017-06-03 | Discharge: 2017-06-03 | Disposition: A | Discharge status: Home / Self Care | Payer: Medicare Other | Source: Ambulatory Visit | Attending: Student | Admitting: Student

## 2017-06-03 ENCOUNTER — Encounter (HOSPITAL_COMMUNITY): Payer: Self-pay

## 2017-06-03 ENCOUNTER — Encounter (HOSPITAL_BASED_OUTPATIENT_CLINIC_OR_DEPARTMENT_OTHER)
Admission: RE | Admit: 2017-06-03 | Discharge: 2017-06-03 | Disposition: A | Discharge status: Home / Self Care | Payer: Medicare Other | Source: Ambulatory Visit | Attending: Student | Admitting: Student

## 2017-06-03 DIAGNOSIS — Q211 Atrial septal defect, unspecified: Secondary | ICD-10-CM

## 2017-06-03 LAB — NM MYOCAR MULTI W/SPECT W/WALL MOTION / EF
LV dias vol: 164 mL (ref 62–150)
LV sys vol: 72 mL
Peak HR: 66 {beats}/min
RATE: 0.39
Rest HR: 51 {beats}/min
SDS: 3
SRS: 1
SSS: 4
TID: 1.04

## 2017-06-03 MED ORDER — TECHNETIUM TC 99M TETROFOSMIN IV KIT
30.0000 | PACK | Freq: Once | INTRAVENOUS | Status: AC | PRN
  Administered 2017-06-03: 30 via INTRAVENOUS

## 2017-06-03 MED ORDER — TECHNETIUM TC 99M TETROFOSMIN IV KIT
10.0000 | PACK | Freq: Once | INTRAVENOUS | Status: AC | PRN
  Administered 2017-06-03: 10 via INTRAVENOUS

## 2017-06-03 MED ORDER — SODIUM CHLORIDE 0.9% FLUSH
INTRAVENOUS | Status: AC
  Administered 2017-06-03: 10 mL via INTRAVENOUS
  Filled 2017-06-03: qty 10

## 2017-06-03 MED ORDER — REGADENOSON 0.4 MG/5ML IV SOLN
INTRAVENOUS | Status: AC
  Administered 2017-06-03: 0.4 mg via INTRAVENOUS
  Filled 2017-06-03: qty 5

## 2017-06-09 DIAGNOSIS — E1122 Type 2 diabetes mellitus with diabetic chronic kidney disease: Secondary | ICD-10-CM | POA: Diagnosis not present

## 2017-06-09 DIAGNOSIS — I4892 Unspecified atrial flutter: Secondary | ICD-10-CM | POA: Diagnosis not present

## 2017-06-09 DIAGNOSIS — Z6830 Body mass index (BMI) 30.0-30.9, adult: Secondary | ICD-10-CM | POA: Diagnosis not present

## 2017-06-09 DIAGNOSIS — R911 Solitary pulmonary nodule: Secondary | ICD-10-CM | POA: Diagnosis not present

## 2017-07-07 DIAGNOSIS — E1142 Type 2 diabetes mellitus with diabetic polyneuropathy: Secondary | ICD-10-CM | POA: Diagnosis not present

## 2017-07-07 DIAGNOSIS — L851 Acquired keratosis [keratoderma] palmaris et plantaris: Secondary | ICD-10-CM | POA: Diagnosis not present

## 2017-07-11 ENCOUNTER — Ambulatory Visit (INDEPENDENT_AMBULATORY_CARE_PROVIDER_SITE_OTHER): Payer: Medicare Other | Admitting: Cardiology

## 2017-07-11 ENCOUNTER — Encounter: Payer: Self-pay | Admitting: Cardiology

## 2017-07-11 VITALS — BP 176/76 | HR 59 | Ht 76.0 in | Wt 257.0 lb

## 2017-07-11 DIAGNOSIS — Z79899 Other long term (current) drug therapy: Secondary | ICD-10-CM

## 2017-07-11 DIAGNOSIS — R002 Palpitations: Secondary | ICD-10-CM | POA: Diagnosis not present

## 2017-07-11 DIAGNOSIS — N189 Chronic kidney disease, unspecified: Secondary | ICD-10-CM

## 2017-07-11 DIAGNOSIS — I4892 Unspecified atrial flutter: Secondary | ICD-10-CM | POA: Diagnosis not present

## 2017-07-11 DIAGNOSIS — I5033 Acute on chronic diastolic (congestive) heart failure: Secondary | ICD-10-CM

## 2017-07-11 DIAGNOSIS — I2510 Atherosclerotic heart disease of native coronary artery without angina pectoris: Secondary | ICD-10-CM

## 2017-07-11 MED ORDER — TORSEMIDE 20 MG PO TABS: 40.0000 mg | ORAL_TABLET | Freq: Two times a day (BID) | ORAL | 6 refills | Status: DC

## 2017-07-11 NOTE — Progress Notes (Signed)
Clinical Summary Mr. Rison is a 67 y.o.male seen today for follow up of the following medical problems.   1. Aflutter - new diagnosis postop after  recent foot surgery - started on eliquis, continued on coreg at that time - since that time occasional intermittent palpitations, can last several minutes.  Compliant with meds.   - EKG today shows SR - still with palpitations at times. Episode 1 week ago after intercourse. Felt hot all over, felt heart racing with pain. Lasted about 15-20 minutes, then reoccurred  2. HTN - we had previous troubles getting bp contorlled. He would have very high bp's in the AM, then rapidly declining bp in the afternoons that were symptomatic after taking his medications.  - after changing his ramipril to night time this pattern resolved - home bp's typically 120s/70s  - home bp's 140s/80s since last visit.   3. CAD  - prior stent to RCA in 1999. Last cath 05/2001 : LM 20-30%, LAD 50-60% And 60-70% mid, LCX with mid AV portion 60-70%, RCA with patent stent with proximal 40-50% disease. LVEF by LVgram 65%, LVEDP 20. Overall moderate lesions, not great anatomically for PCI per notes, treated medically.  - 10/2014 Lexiscan with small area of ischemia mid inferior to apical, overall low risk  - denies any chest pain. No SOB or DOE - exertion limited by foot pain. Does heavy yardwork without troubles. Can walk up flight of stairs without limitation - Jan 2019 nuclear stress no ischemia  - chest pain symptoms associated with fluttering/palpitation episodes only  4. Acute on cronic diastolic HF -37/8588  echo LVEF 60-65%, grade II diastoilc dysfunction - compliant with meds - recent significant weight gain, increased LE edema. Weights up 12 lbs since 04/2017   5. 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 Hyperlipidemia  - compliant with statin   7. COPD - followed by pcp   8. AAA - needs repeat US next year.   Past Medical History:    Diagnosis Date  . Asthma   . Atrial flutter ([place])   . CAD (coronary artery disease)    a. s/p prior stenting of RCA in 1999 b. cath in 2003 showing moderate CAD c. NST in 2016 showing small area of ischemic and low-risk  . Chronic renal insufficiency   . COPD (chronic obstructive pulmonary disease) ([place])    with ongoing tobacco use and patient failed sham takes  . DM2 (diabetes mellitus, type 2) ([place])   . Dyslipidemia   . Edema    chronic lower extremity secondary to right heart failure and chronic venous insufficiency  . Headache   . HTN (hypertension)   . Morbid obesity ([place])   . Pneumonia   . 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   Marland Kitchen Reflux esophagitis    hx  . Renal insufficiency   . Sleep apnea   . Two-vessel coronary artery disease    moderate. by cath in 2003. Status post stenting of the mdi RCA November 1999 normal left ventricular ejection fraction     No Known Allergies   Current Outpatient Medications  Medication Sig Dispense Refill  . acetaminophen (TYLENOL) 325 MG tablet Take 975 mg daily by mouth. 3 tablets every morning    . albuterol (VENTOLIN HFA) 108 (90 BASE) MCG/ACT inhaler Inhale 2 puffs into the lungs every 6 (six) hours as needed for wheezing or shortness of breath.     Marland Kitchen  apixaban (ELIQUIS) 5 MG TABS tablet Take 1 tablet (5 mg total) by mouth 2 (two) times daily. 60 tablet 3  . carvedilol (COREG) 25 MG tablet Take 1 tablet (25 mg total) by mouth 2 (two) times daily. 180 tablet 3  . hydrALAZINE (APRESOLINE) 100 MG tablet TAKE ONE TABLET BY MOUTH THREE TIMES DAILY 90 tablet 0  . insulin glargine (LANTUS) 100 UNIT/ML injection Inject 35 Units daily after breakfast into the skin.     Marland Kitchen LINZESS 290 MCG CAPS capsule Take 290 mcg by mouth daily as needed (for constipation).   6  . Multiple Vitamin (MULTIVITAMIN) tablet Take 1 tablet by mouth daily.      Marland Kitchen omeprazole (PRILOSEC) 20 MG capsule Take 20 mg  daily by mouth.    . potassium chloride SA (K-DUR,KLOR-CON) 20 MEQ tablet Take 1 tablet (20 mEq total) by mouth daily. 30 tablet 0  . rosuvastatin (CRESTOR) 20 MG tablet TAKE ONE TABLET BY MOUTH DAILY 30 tablet 0  . silver sulfADIAZINE (SILVADENE) 1 % cream Apply 1 application daily topically. For wound care with dressing    . torsemide (DEMADEX) 20 MG tablet Take 2 tablets (40 mg total) by mouth daily. 60 tablet 0   No current facility-administered medications for this visit.      Past Surgical History:  Procedure Laterality Date  . ABDOMINAL SURGERY    . APPENDECTOMY    . BACK SURGERY    . CARDIAC CATHETERIZATION     stent  . CHOLECYSTECTOMY    . CLOSED REDUCTION SHOULDER DISLOCATION    . diabetic ulcers    . GROIN EXPLORATION    . OSTECTOMY Left 04/23/2017   Procedure: OSTECTOMY LEFT GREAT TOE;  Surgeon: [name], DPM;  Location: [facility];  Service: Podiatry;  Laterality: Left;  left great toe  . TUMOR REMOVAL     small intestine      No Known Allergies    Family History  Problem Relation Age of Onset  . Diabetes Mother   . Alcohol abuse Father      Social History Mr. Boschert reports that he has been smoking cigarettes.  He started smoking about 49 years ago. He has a 40.00 pack-year smoking history. he has never used smokeless tobacco. Mr. Willetts reports that he does not drink alcohol.   Review of Systems CONSTITUTIONAL: No weight loss, fever, chills, weakness or fatigue.  HEENT: Eyes: No visual loss, blurred vision, double vision or yellow sclerae.No hearing loss, sneezing, congestion, runny nose or sore throat.  SKIN: No rash or itching.  CARDIOVASCULAR: per hpi RESPIRATORY: No shortness of breath, cough or sputum.  GASTROINTESTINAL: No anorexia, nausea, vomiting or diarrhea. No abdominal pain or blood.  GENITOURINARY: No burning on urination, no polyuria NEUROLOGICAL: No headache, dizziness, syncope, paralysis, ataxia, numbness or tingling in the  extremities. No change in bowel or bladder control.  MUSCULOSKELETAL: No muscle, back pain, joint pain or stiffness.  LYMPHATICS: No enlarged nodes. No history of splenectomy.  PSYCHIATRIC: No history of depression or anxiety.  ENDOCRINOLOGIC: No reports of sweating, cold or heat intolerance. No polyuria or polydipsia.  Marland Kitchen   Physical Examination Vitals:   07/11/17 1252  BP: (!) 176/76  Pulse: (!) 59  SpO2: 97%   Vitals:   07/11/17 1252  Weight: 257 lb (116.6 kg)  Height: 6\' 4"  (1.93 m)    Gen: resting comfortably, no acute distress HEENT: no scleral icterus, pupils equal round and reactive, no palptable cervical adenopathy,  CV: RRR,  no m/r/g, no jvd Resp: Clear to auscultation bilaterally GI: abdomen is soft, non-tender, non-distended, normal bowel sounds, no hepatosplenomegaly MSK: extremities are warm, 2+ bilateral Le edema Skin: warm, no rash Neuro:  no focal deficits Psych: appropriate affect   Diagnostic Studies 05/2001 Cath FINDINGS:  1. Left main trunk: Medium caliber vessel. This is a long vessel with a  distal taper of 20-30%.  2. Left anterior descending artery: This is a medium caliber vessel that  provides a large bifurcating first diagonal Cayleigh Paull in the proximal segment  and before then extending to the apex, the LAD tapers significantly after  the diagonal Terryn Rosenkranz. There is moderate disease of 50-60% after the  diagonal Carrick Rijos and then a focal eccentric narrowing of 60-70% in the mid  section of the LAD. The diagonal Navina Wohlers has moderate disease of 30-40% in  the proximal segment. The lateral division is a small caliber vessel with  an ostial narrowing of 60%.  3. Left circumflex artery: This is a small caliber vessel that provides a  trivial first marginal Rishan Oyama in the mid section and a medium caliber  second marginal Daveion Robar distally. The mid AV circumflex at the takeoff of  the first and second diagonal branches had moderate diffuse  disease of  60-70%.  4. Right coronary artery: Dominant. This is a large caliber vessel that  provides a posterior descending artery and two posteroventricular branches  in the terminal segment. The right coronary artery has evidence of an  existing stent in the mid section that is widely patent. Prior to stent is  a focal discrete narrowing of 40-50%. Distal to the stent is moderate  disease of 30%. The distal Sharlotte Baka vessels also have mild disease of 30%.  5. Left ventricle: Normal end-systolic and end-diastolic dimensions. Overall  left ventricular function is well preserved. Ejection fraction is greater  than 65%. No mitral regurgitation. LV pressure is 180/10, aortic is  180/90. LV EDP equals 20.  ASSESSMENT AND PLAN: Mr. Zunker is a 67 year old gentleman with moderate  three-vessel coronary artery disease. The patients plaque burden in the left  anterior descending artery and circumflex appears to have increased somewhat  compared to his previous angiogram. Unfortunately, the mid and distal left  anterior descending artery is a small caliber vessel with a long diffuse  segment of disease and would be associated with a high restenosis rate.  Similarly, the circumflex would suffer the same fate.  Further assessment with a stress imaging study would be prudent to isolate the  area of ischemia. Percutaneous intervention may then be targeted if  indicated. In addition, aggressive medical therapy should be pursued as the  patient currently is not on any anti-anginal therapy and has poorly controlled  hypertension. The patient will also be counseled regarding smoking cessation.    07/2013 PFTs Mild obstruction    10/2014 Lexiscan  Unable to adequately ambulate to treadmill due to right hip pain. Lexiscan employed.   Defect 1: There is a small defect of mild severity present in the mid inferior and apical inferior location. This defect is partially  reversible.   This is a low risk study.   Nuclear stress EF: 58%.   Small region of apical inferior ischemia    07/2015 Echo Study Conclusions  - Left ventricle: The cavity size was normal. Wall thickness was  increased increased in a pattern of mild to moderate LVH.  Systolic function was normal. The estimated ejection fraction was  in the range of 60%  to 65%. Diastolic function is abnormal,  indeterminate grade. Wall motion was normal; there were no  regional wall motion abnormalities. - Aortic valve: Mildly calcified annulus. Trileaflet; mildly  thickened leaflets. Valve area (VTI): 2.39 cm^2. Valve area  (Vmax): 2.58 cm^2. Valve area (Vmean): 2.7 cm^2. - Mitral valve: Mildly calcified annulus. Normal thickness leaflets  . - Technically adequate study.  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 Recommend followup by ultrasound in 2 years. This recommendation follows ACR consensus guidelines:   Jan 2019 nuclear stress  T wave inversions in inferior leads and nonspecific T wave abnormalities in V6 seen throughout study.  The study is normal. No myocardial ischemia or scar.  This is a low risk study.  Nuclear stress EF: 56%.    Assessment and Plan  1. Paroxysmal aflutter - CHADS2Vasc score is  4 (HTN, CHF, CAD, DM2), continue eliquis  - ongonig symptoms, we will obtain a 2 week monitor to further evalaute. If episodes of aflutter, may need to consider antiarrhytmic stategy given his baseline heart rates are high 50s while in SR on coreg.  - EKG in clinic today shows SR rate 58  2. HTN - elevated in clinic, home numbers closer to goal - followed with increased diuresis.   3. AAA - [place] 08/2015 3.5 cm aneurysm. Needs repeat US 08/2017   3. CAD  - no symptoms, continue to monitor.   4. Hyperlipidemia  - he will continue statin  5. Acute  on chronic diastolic HF - significant weight gain and edema - increase torsemide to 40mg  bid, check BMET/Mg in 2 weeks - if having episodes of aflutter, could be exacerbating his CHF. F/u monitor results.   6. CKD IV - lost to f/u with Dr Hinda Lenis, we will refer again   F/u 3 weeks    Arnoldo Lenis, M.D.,

## 2017-07-11 NOTE — Patient Instructions (Addendum)
Your physician wants you to follow-up in: 3 weeks with Dr.Branch      INCREASE Torsemide to 40 mg twice a day    Get lab work :BMET,Magnesium in 2 weeks    Your physician has recommended that you wear an event monitor for 2 weeks. Event monitors are medical devices that record the heart's electrical activity. Doctors most often [place] these monitors to diagnose arrhythmias. Arrhythmias are problems with the speed or rhythm of the heartbeat. The monitor is a small, portable device. You can wear one while you do your normal daily activities. This is usually used to diagnose what is causing palpitations/syncope (passing out).    You have been referred to Dr Lowanda Foster, renal doctor, they will call you for an apt    Thank you for choosing [facility] !

## 2017-07-16 ENCOUNTER — Encounter: Payer: Self-pay | Admitting: Cardiology

## 2017-07-17 ENCOUNTER — Telehealth: Payer: Self-pay | Admitting: Cardiology

## 2017-07-17 NOTE — Telephone Encounter (Signed)
Pt lvm stating he's yet to receive his heart monitor yet. Please give him a call [phone]

## 2017-07-17 NOTE — Telephone Encounter (Signed)
Returned pt call. Let him know that according to the company he should be receiving his monitor by end of business today. He stated that he will call back in the morning if he does not get it today.

## 2017-07-20 DIAGNOSIS — R002 Palpitations: Secondary | ICD-10-CM | POA: Diagnosis not present

## 2017-07-22 ENCOUNTER — Ambulatory Visit (INDEPENDENT_AMBULATORY_CARE_PROVIDER_SITE_OTHER): Payer: Medicare Other

## 2017-07-22 DIAGNOSIS — R002 Palpitations: Secondary | ICD-10-CM | POA: Diagnosis not present

## 2017-07-31 ENCOUNTER — Telehealth: Payer: Self-pay | Admitting: Cardiology

## 2017-07-31 NOTE — Telephone Encounter (Signed)
lvm wanting to know if he needs to r/s his apt since his monitor arrived late. Can be reached @ [phone]

## 2017-07-31 NOTE — Telephone Encounter (Signed)
Returned pt call. Rescheduled his appointment for April 3 @1 :30. He will have labs drawn on 3/20.

## 2017-08-05 ENCOUNTER — Ambulatory Visit: Payer: Medicare Other | Admitting: Student

## 2017-08-06 ENCOUNTER — Other Ambulatory Visit: Payer: Self-pay | Admitting: Cardiology

## 2017-08-06 DIAGNOSIS — Z1159 Encounter for screening for other viral diseases: Secondary | ICD-10-CM | POA: Diagnosis not present

## 2017-08-06 DIAGNOSIS — N183 Chronic kidney disease, stage 3 (moderate): Secondary | ICD-10-CM | POA: Diagnosis not present

## 2017-08-06 DIAGNOSIS — Z79899 Other long term (current) drug therapy: Secondary | ICD-10-CM | POA: Diagnosis not present

## 2017-08-06 DIAGNOSIS — E559 Vitamin D deficiency, unspecified: Secondary | ICD-10-CM | POA: Diagnosis not present

## 2017-08-06 DIAGNOSIS — D509 Iron deficiency anemia, unspecified: Secondary | ICD-10-CM | POA: Diagnosis not present

## 2017-08-06 DIAGNOSIS — I10 Essential (primary) hypertension: Secondary | ICD-10-CM | POA: Diagnosis not present

## 2017-08-06 DIAGNOSIS — R809 Proteinuria, unspecified: Secondary | ICD-10-CM | POA: Diagnosis not present

## 2017-08-07 LAB — BASIC METABOLIC PANEL
BUN/Creatinine Ratio: 17 (ref 10–24)
BUN: 55 mg/dL — ABNORMAL HIGH (ref 8–27)
CO2: 24 mmol/L (ref 20–29)
Calcium: 8.6 mg/dL (ref 8.6–10.2)
Chloride: 103 mmol/L (ref 96–106)
Creatinine, Ser: 3.23 mg/dL — ABNORMAL HIGH (ref 0.76–1.27)
GFR calc Af Amer: 22 mL/min/{1.73_m2} — ABNORMAL LOW (ref >59)
GFR calc non Af Amer: 19 mL/min/{1.73_m2} — ABNORMAL LOW (ref >59)
Glucose: 121 mg/dL — ABNORMAL HIGH (ref 65–99)
Potassium: 4.6 mmol/L (ref 3.5–5.2)
Sodium: 142 mmol/L (ref 134–144)

## 2017-08-07 LAB — SPECIMEN STATUS REPORT

## 2017-08-07 LAB — MAGNESIUM: Magnesium: 2.3 mg/dL (ref 1.6–2.3)

## 2017-08-08 ENCOUNTER — Other Ambulatory Visit: Payer: Self-pay | Admitting: Cardiology

## 2017-08-08 ENCOUNTER — Other Ambulatory Visit: Payer: Self-pay | Admitting: Cardiovascular Disease

## 2017-08-08 DIAGNOSIS — E559 Vitamin D deficiency, unspecified: Secondary | ICD-10-CM | POA: Diagnosis not present

## 2017-08-08 DIAGNOSIS — D509 Iron deficiency anemia, unspecified: Secondary | ICD-10-CM | POA: Diagnosis not present

## 2017-08-08 DIAGNOSIS — R809 Proteinuria, unspecified: Secondary | ICD-10-CM | POA: Diagnosis not present

## 2017-08-08 DIAGNOSIS — N184 Chronic kidney disease, stage 4 (severe): Secondary | ICD-10-CM | POA: Diagnosis not present

## 2017-08-11 DIAGNOSIS — L97512 Non-pressure chronic ulcer of other part of right foot with fat layer exposed: Secondary | ICD-10-CM | POA: Diagnosis not present

## 2017-08-11 DIAGNOSIS — E1142 Type 2 diabetes mellitus with diabetic polyneuropathy: Secondary | ICD-10-CM | POA: Diagnosis not present

## 2017-08-11 DIAGNOSIS — E11621 Type 2 diabetes mellitus with foot ulcer: Secondary | ICD-10-CM | POA: Diagnosis not present

## 2017-08-19 NOTE — Progress Notes (Deleted)
Cardiology Office Note    Date:  08/19/2017   ID:  [name], Winterbottom June 13, 1950, MRN [id number]  PCP:  [name], MD  Cardiologist: Carlyle Dolly, MD    No chief complaint on file.   History of Present Illness:    [name] is a 67 y.o. male with past medical history of CAD (s/p prior stenting of RCA in 1999, cath in 2003 showing moderate CAD, low-risk NST in 2016 and 05/2017), paroxysmal atrial flutter (on Eliquis), HTN, HLD, Type 2 DM, Stage 4 CKD, COPD, and tobacco use who presents to the office today for 21-month follow-up.   He was examined by myself in 05/2017 following a recent hospitalization for chest pain during which time cyclic troponin values peaked at 0.45.  At the time of his appointment, he did no repeat episodes of dyspnea on exertion which was similar to when he required stent placement in the past, therefore a repeat stress test was recommended.  This was performed on 06/03/2017 and showed no evidence of ischemia or scar, overall being a low-risk study.  He was examined by Dr. [name] on 07/11/2017 and reported intermittent palpitations with a recent episode occurring the week prior to his visit which lasted 15-20 minutes.  A 2-week cardiac event monitor was recommended and showed ***.   Past Medical History:  Diagnosis Date  . Asthma   . Atrial flutter ([place])   . CAD (coronary artery disease)    a. s/p prior stenting of RCA in 1999 b. cath in 2003 showing moderate CAD c. NST in 2016 showing small area of ischemic and low-risk  . Chronic renal insufficiency   . COPD (chronic obstructive pulmonary disease) ([place])    with ongoing tobacco use and patient failed sham takes  . DM2 (diabetes mellitus, type 2) ([place])   . Dyslipidemia   . Edema    chronic lower extremity secondary to right heart failure and chronic venous insufficiency  . Headache   . HTN (hypertension)   . Morbid obesity ([place])   . Pneumonia   . 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   Marland Kitchen Reflux esophagitis    hx  . Renal insufficiency   . Sleep apnea   . Two-vessel coronary artery disease    moderate. by cath in 2003. Status post stenting of the mdi RCA November 1999 normal left ventricular ejection fraction    Past Surgical History:  Procedure Laterality Date  . ABDOMINAL SURGERY    . APPENDECTOMY    . BACK SURGERY    . CARDIAC CATHETERIZATION     stent  . CHOLECYSTECTOMY    . CLOSED REDUCTION SHOULDER DISLOCATION    . diabetic ulcers    . GROIN EXPLORATION    . OSTECTOMY Left 04/23/2017   Procedure: OSTECTOMY LEFT GREAT TOE;  Surgeon: [name], DPM;  Location: [facility];  Service: Podiatry;  Laterality: Left;  left great toe  . TUMOR REMOVAL     small intestine     Current Medications: Outpatient Medications Prior to Visit  Medication Sig Dispense Refill  . acetaminophen (TYLENOL) 325 MG tablet Take 975 mg daily by mouth. 3 tablets every morning    . albuterol (VENTOLIN HFA) 108 (90 BASE) MCG/ACT inhaler Inhale 2 puffs into the lungs every 6 (six) hours as needed for wheezing or shortness of breath.     . carvedilol (COREG) 25 MG tablet Take 1 tablet (25 mg total)  by mouth 2 (two) times daily. 180 tablet 3  . ELIQUIS 5 MG TABS tablet TAKE ONE TABLET BY MOUTH TWICE DAILY. 60 tablet 3  . hydrALAZINE (APRESOLINE) 100 MG tablet TAKE ONE TABLET BY MOUTH THREE TIMES DAILY 90 tablet 3  . insulin glargine (LANTUS) 100 UNIT/ML injection Inject 35 Units daily after breakfast into the skin.     Marland Kitchen LINZESS 290 MCG CAPS capsule Take 290 mcg by mouth daily as needed (for constipation).   6  . Multiple Vitamin (MULTIVITAMIN) tablet Take 1 tablet by mouth daily.      Marland Kitchen omeprazole (PRILOSEC) 20 MG capsule Take 20 mg daily by mouth.    . potassium chloride SA (K-DUR,KLOR-CON) 20 MEQ tablet Take 1 tablet (20 mEq total) by mouth daily. 30 tablet 0  . rosuvastatin (CRESTOR) 20 MG tablet TAKE ONE TABLET BY MOUTH DAILY  30 tablet 3  . silver sulfADIAZINE (SILVADENE) 1 % cream Apply 1 application daily topically. For wound care with dressing    . torsemide (DEMADEX) 20 MG tablet Take 2 tablets (40 mg total) by mouth 2 (two) times daily. 120 tablet 6   No facility-administered medications prior to visit.      Allergies:   Patient has no known allergies.   Social History   Socioeconomic History  . Marital status: Single    Spouse name: Not on file  . Number of children: Not on file  . Years of education: Not on file  . Highest education level: Not on file  Occupational History  . Not on file  Social Needs  . Financial resource strain: Not on file  . Food insecurity:    Worry: Not on file    Inability: Not on file  . Transportation needs:    Medical: Not on file    Non-medical: Not on file  Tobacco Use  . Smoking status: Current Every Day Smoker    Packs/day: 1.00    Years: 40.00    Pack years: 40.00    Types: Cigarettes    Start date: 05/20/1968  . Smokeless tobacco: Never Used  Substance and Sexual Activity  . Alcohol use: No    Alcohol/week: 0.0 oz  . Drug use: No  . Sexual activity: Yes  Lifestyle  . Physical activity:    Days per week: Not on file    Minutes per session: Not on file  . Stress: Not on file  Relationships  . Social connections:    Talks on phone: Not on file    Gets together: Not on file    Attends religious service: Not on file    Active member of club or organization: Not on file    Attends meetings of clubs or organizations: Not on file    Relationship status: Not on file  Other Topics Concern  . Not on file  Social History Narrative   Patient continues to smoke.      Family History:  The patient's ***family history includes Alcohol abuse in his father; Diabetes in his mother.   Review of Systems:   Please see the history of present illness.     General:  No chills, fever, night sweats or weight changes.  Cardiovascular:  No chest pain, dyspnea on  exertion, edema, orthopnea, palpitations, paroxysmal nocturnal dyspnea. Dermatological: No rash, lesions/masses Respiratory: No cough, dyspnea Urologic: No hematuria, dysuria Abdominal:   No nausea, vomiting, diarrhea, bright red blood per rectum, melena, or hematemesis Neurologic:  No visual changes, wkns, changes in  mental status. All other systems reviewed and are otherwise negative except as noted above.   Physical Exam:    VS:  There were no vitals taken for this visit.   General: Well developed, well nourished,male appearing in no acute distress. Head: Normocephalic, atraumatic, sclera non-icteric, no xanthomas, nares are without discharge.  Neck: No carotid bruits. JVD not elevated.  Lungs: Respirations regular and unlabored, without wheezes or rales.  Heart: ***Regular rate and rhythm. No S3 or S4.  No murmur, no rubs, or gallops appreciated. Abdomen: Soft, non-tender, non-distended with normoactive bowel sounds. No hepatomegaly. No rebound/guarding. No obvious abdominal masses. Msk:  Strength and tone appear normal for age. No joint deformities or effusions. Extremities: No clubbing or cyanosis. No edema.  Distal pedal pulses are 2+ bilaterally. Neuro: Alert and oriented X 3. Moves all extremities spontaneously. No focal deficits noted. Psych:  Responds to questions appropriately with a normal affect. Skin: No rashes or lesions noted  Wt Readings from Last 3 Encounters:  07/11/17 257 lb (116.6 kg)  05/30/17 259 lb (117.5 kg)  05/09/17 234 lb 14.4 oz (106.5 kg)        Studies/Labs Reviewed:   EKG:  EKG is*** ordered today.  The ekg ordered today demonstrates ***  Recent Labs: 05/09/2017: ALT 9; B Natriuretic Peptide 293.0; Hemoglobin 14.7; Platelets 293 08/06/2017: BUN 55; Creatinine, Ser 3.23; Magnesium 2.3; Potassium 4.6; Sodium 142   Lipid Panel No results found for: CHOL, TRIG, HDL, CHOLHDL, VLDL, LDLCALC, LDLDIRECT  Additional studies/ records that were  reviewed today include:   Echocardiogram: 05/10/2017 Study Conclusions  - Left ventricle: The cavity size was normal. Wall thickness was   increased in a pattern of severe LVH. Systolic function was   normal. The estimated ejection fraction was in the range of 60%   to 65%. Wall motion was normal; there were no regional wall   motion abnormalities. Features are consistent with a pseudonormal   left ventricular filling pattern, with concomitant abnormal   relaxation and increased filling pressure (grade 2 diastolic   dysfunction). - Mitral valve: Calcified annulus. Mildly thickened leaflets . - Left atrium: The atrium was mildly to moderately dilated.  Impressions:  - Normal LV systolic function; severe LVH; moderate diastolic   dysfunction; mild to moderate LAE.   NST: 05/2017  T wave inversions in inferior leads and nonspecific T wave abnormalities in V6 seen throughout study.  The study is normal. No myocardial ischemia or scar.  This is a low risk study.  Nuclear stress EF: 56%.    Assessment:    No diagnosis found.   Plan:   In order of problems listed above:  1. CAD - s/p prior stenting of RCA in 1999 with cath in 2003 showing moderate CAD. Most recent ischemic evaluation included a low-risk NST in 05/2017.   2. Paroxysmal Atrial Flutter/ Palpitations  3. Chronic Diastolic CHF  4. HTN  5. HLD  6. Stage 4 CKD  7. Tobacco Use   Medication Adjustments/Labs and Tests Ordered: Current medicines are reviewed at length with the patient today.  Concerns regarding medicines are outlined above.  Medication changes, Labs and Tests ordered today are listed in the Patient Instructions below. There are no Patient Instructions on file for this visit.   Signed, Erma Heritage, PA-C  08/19/2017 4:52 PM    [facility] [address] [address] [place], [place] [address] Phone: [phone]

## 2017-08-20 ENCOUNTER — Ambulatory Visit: Payer: Medicare Other | Admitting: Student

## 2017-08-25 DIAGNOSIS — Z6830 Body mass index (BMI) 30.0-30.9, adult: Secondary | ICD-10-CM | POA: Diagnosis not present

## 2017-08-25 DIAGNOSIS — L97511 Non-pressure chronic ulcer of other part of right foot limited to breakdown of skin: Secondary | ICD-10-CM | POA: Diagnosis not present

## 2017-08-25 DIAGNOSIS — N189 Chronic kidney disease, unspecified: Secondary | ICD-10-CM | POA: Diagnosis not present

## 2017-08-25 DIAGNOSIS — E1142 Type 2 diabetes mellitus with diabetic polyneuropathy: Secondary | ICD-10-CM | POA: Diagnosis not present

## 2017-08-25 DIAGNOSIS — Z720 Tobacco use: Secondary | ICD-10-CM | POA: Diagnosis not present

## 2017-08-25 DIAGNOSIS — E11621 Type 2 diabetes mellitus with foot ulcer: Secondary | ICD-10-CM | POA: Diagnosis not present

## 2017-08-26 ENCOUNTER — Ambulatory Visit: Payer: Medicare Other | Admitting: Student

## 2017-08-29 ENCOUNTER — Other Ambulatory Visit (HOSPITAL_COMMUNITY): Payer: Self-pay | Admitting: Family Medicine

## 2017-08-29 DIAGNOSIS — R918 Other nonspecific abnormal finding of lung field: Secondary | ICD-10-CM

## 2017-09-02 DIAGNOSIS — D500 Iron deficiency anemia secondary to blood loss (chronic): Secondary | ICD-10-CM | POA: Diagnosis not present

## 2017-09-02 DIAGNOSIS — E1142 Type 2 diabetes mellitus with diabetic polyneuropathy: Secondary | ICD-10-CM | POA: Diagnosis not present

## 2017-09-02 DIAGNOSIS — I4892 Unspecified atrial flutter: Secondary | ICD-10-CM | POA: Diagnosis not present

## 2017-09-02 DIAGNOSIS — N184 Chronic kidney disease, stage 4 (severe): Secondary | ICD-10-CM | POA: Diagnosis not present

## 2017-09-02 DIAGNOSIS — Z86010 Personal history of colonic polyps: Secondary | ICD-10-CM | POA: Diagnosis not present

## 2017-09-02 DIAGNOSIS — Z7901 Long term (current) use of anticoagulants: Secondary | ICD-10-CM | POA: Diagnosis not present

## 2017-09-03 NOTE — Progress Notes (Signed)
Cardiology Office Note    Date:  09/04/2017   ID:  [name], [place] Sep 20, 1950, MRN [id number]  PCP:  [name], MD  Cardiologist: Carlyle Dolly, MD    Chief Complaint  Patient presents with  . Follow-up    2 month visit    History of Present Illness:    [name] is a 67 y.o. male with past medical history of CAD (s/p prior stenting of RCA in 1999, cath in 2003 showing moderate CAD, low-risk NST in 2016 and 05/2017), paroxysmal atrial flutter (on Eliquis), HTN, HLD, Type 2 DM, Stage 4 CKD, COPD, and tobacco use who presents to the office today for 62-month follow-up.   He was examined by myself in 05/2017 following a recent hospitalization for chest pain during which time cyclic troponin values peaked at 0.45.At the time of his appointment, he reported repeat episodes of dyspnea on exertion which was similar to when he required stent placement in the past, therefore a repeat stress test was recommended. This was performed on 06/03/2017 and showed no evidence of ischemia or scar, overall being a low-risk study.   He was examined by Dr. [name] on 07/11/2017 and reported intermittent palpitations with a recent episode occurring the week prior to his visit which lasted 15-20 minutes. A 2-week cardiac event monitor was recommended and showed episodes of NSR and rate-controlled atrial fibrillation with reported symptoms correlating with atrial fibrillation. HR varied from 51 to 115 bpm with an average HR of 69.  In talking with the patient today, he reports overall doing well since his last office visit. He does experience occasional episodes of palpitations which usually resolve within 30 to 40 minutes. Says this has significantly decreased in frequency since his last visit. He denies any recent chest pain or dyspnea on exertion.    He has been noticing more frequent fatigue and says that his hemoglobin has dropped from 14.0 to 11.0 by most recent labs. He has been scheduled for  an EGD in the coming months. Was also started on iron supplementation by Nephrology. He denies any specific orthopnea, PND, or lower extremity edema. Weight has declined on his home scales as he reports a decreased appetite.   Past Medical History:  Diagnosis Date  . Asthma   . Atrial flutter ([place])   . CAD (coronary artery disease)    a. s/p prior stenting of RCA in 1999 b. cath in 2003 showing moderate CAD c. NST in 2016 showing small area of ischemic and low-risk  . Chronic renal insufficiency   . COPD (chronic obstructive pulmonary disease) ([place])    with ongoing tobacco use and patient failed sham takes  . DM2 (diabetes mellitus, type 2) ([place])   . Dyslipidemia   . Edema    chronic lower extremity secondary to right heart failure and chronic venous insufficiency  . Headache   . HTN (hypertension)   . Morbid obesity ([place])   . Pneumonia   . 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   Marland Kitchen Reflux esophagitis    hx  . Renal insufficiency   . Sleep apnea   . Two-vessel coronary artery disease    moderate. by cath in 2003. Status post stenting of the mdi RCA November 1999 normal left ventricular ejection fraction    Past Surgical History:  Procedure Laterality Date  . ABDOMINAL SURGERY    . APPENDECTOMY    . BACK SURGERY    .  CARDIAC CATHETERIZATION     stent  . CHOLECYSTECTOMY    . CLOSED REDUCTION SHOULDER DISLOCATION    . diabetic ulcers    . GROIN EXPLORATION    . OSTECTOMY Left 04/23/2017   Procedure: OSTECTOMY LEFT GREAT TOE;  Surgeon: [name], DPM;  Location: [facility];  Service: Podiatry;  Laterality: Left;  left great toe  . TUMOR REMOVAL     small intestine     Current Medications: Outpatient Medications Prior to Visit  Medication Sig Dispense Refill  . acetaminophen (TYLENOL) 325 MG tablet Take 975 mg daily by mouth. 3 tablets every morning    . albuterol (VENTOLIN HFA) 108 (90 BASE) MCG/ACT inhaler  Inhale 2 puffs into the lungs every 6 (six) hours as needed for wheezing or shortness of breath.     . carvedilol (COREG) 25 MG tablet Take 1 tablet (25 mg total) by mouth 2 (two) times daily. 180 tablet 3  . ELIQUIS 5 MG TABS tablet TAKE ONE TABLET BY MOUTH TWICE DAILY. 60 tablet 3  . FERREX 150 150 MG capsule Take 150 mg by mouth daily.  4  . hydrALAZINE (APRESOLINE) 100 MG tablet TAKE ONE TABLET BY MOUTH THREE TIMES DAILY 90 tablet 3  . insulin glargine (LANTUS) 100 UNIT/ML injection Inject 35 Units daily after breakfast into the skin.     Marland Kitchen LINZESS 290 MCG CAPS capsule Take 290 mcg by mouth daily as needed (for constipation).   6  . Multiple Vitamin (MULTIVITAMIN) tablet Take 1 tablet by mouth daily.      Marland Kitchen omeprazole (PRILOSEC) 20 MG capsule Take 20 mg daily by mouth.    . potassium chloride SA (K-DUR,KLOR-CON) 20 MEQ tablet Take 1 tablet (20 mEq total) by mouth daily. 30 tablet 0  . rosuvastatin (CRESTOR) 20 MG tablet TAKE ONE TABLET BY MOUTH DAILY 30 tablet 3  . silver sulfADIAZINE (SILVADENE) 1 % cream Apply 1 application daily topically. For wound care with dressing    . torsemide (DEMADEX) 20 MG tablet Take 2 tablets (40 mg total) by mouth 2 (two) times daily. 120 tablet 6   No facility-administered medications prior to visit.      Allergies:   Patient has no known allergies.   Social History   Socioeconomic History  . Marital status: Married    Spouse name: Not on file  . Number of children: Not on file  . Years of education: Not on file  . Highest education level: Not on file  Occupational History  . Not on file  Social Needs  . Financial resource strain: Not on file  . Food insecurity:    Worry: Not on file    Inability: Not on file  . Transportation needs:    Medical: Not on file    Non-medical: Not on file  Tobacco Use  . Smoking status: Current Every Day Smoker    Packs/day: 1.00    Years: 40.00    Pack years: 40.00    Types: Cigarettes    Start date:  05/20/1968  . Smokeless tobacco: Never Used  Substance and Sexual Activity  . Alcohol use: No    Alcohol/week: 0.0 oz  . Drug use: No  . Sexual activity: Yes  Lifestyle  . Physical activity:    Days per week: Not on file    Minutes per session: Not on file  . Stress: Not on file  Relationships  . Social connections:    Talks on phone: Not on file  Gets together: Not on file    Attends religious service: Not on file    Active member of club or organization: Not on file    Attends meetings of clubs or organizations: Not on file    Relationship status: Not on file  Other Topics Concern  . Not on file  Social History Narrative   Patient continues to smoke.      Family History:  The patient's family history includes Alcohol abuse in his father; Diabetes in his mother.   Review of Systems:   Please see the history of present illness.     General:  No chills, fever, night sweats or weight changes. Positive for fatigue.  Cardiovascular:  No chest pain, dyspnea on exertion, edema, orthopnea, palpitations, paroxysmal nocturnal dyspnea. Dermatological: No rash, lesions/masses Respiratory: No cough, dyspnea Urologic: No hematuria, dysuria Abdominal:   No nausea, vomiting, diarrhea, bright red blood per rectum, melena, or hematemesis Neurologic:  No visual changes, wkns, changes in mental status. All other systems reviewed and are otherwise negative except as noted above.   Physical Exam:    VS:  BP 138/74 (BP Location: Left Arm)   Pulse 67   Ht 6\' 4"  (1.93 m)   Wt 242 lb (109.8 kg)   SpO2 96%   BMI 29.46 kg/m    General: Well developed, well nourished Caucasian male appearing in no acute distress. Head: Normocephalic, atraumatic, sclera non-icteric, no xanthomas, nares are without discharge.  Neck: No carotid bruits. JVD not elevated.  Lungs: Respirations regular and unlabored, without wheezes or rales.  Heart: Regular rate and rhythm. No S3 or S4.  No murmur, no rubs, or  gallops appreciated. Abdomen: Soft, non-tender, non-distended with normoactive bowel sounds. No hepatomegaly. No rebound/guarding. No obvious abdominal masses. Msk:  Strength and tone appear normal for age. No joint deformities or effusions. Extremities: No clubbing or cyanosis. Trace lower extremity edema.  Distal pedal pulses are 2+ bilaterally. Neuro: Alert and oriented X 3. Moves all extremities spontaneously. No focal deficits noted. Psych:  Responds to questions appropriately with a normal affect. Skin: No rashes or lesions noted  Wt Readings from Last 3 Encounters:  09/04/17 242 lb (109.8 kg)  07/11/17 257 lb (116.6 kg)  05/30/17 259 lb (117.5 kg)     Studies/Labs Reviewed:   EKG:  EKG is not ordered today.    Recent Labs: 05/09/2017: ALT 9; B Natriuretic Peptide 293.0; Hemoglobin 14.7; Platelets 293 08/06/2017: BUN 55; Creatinine, Ser 3.23; Magnesium 2.3; Potassium 4.6; Sodium 142   Lipid Panel No results found for: CHOL, TRIG, HDL, CHOLHDL, VLDL, LDLCALC, LDLDIRECT  Additional studies/ records that were reviewed today include:   Echocardiogram: 05/10/2017 Study Conclusions  - Left ventricle: The cavity size was normal. Wall thickness was   increased in a pattern of severe LVH. Systolic function was   normal. The estimated ejection fraction was in the range of 60%   to 65%. Wall motion was normal; there were no regional wall   motion abnormalities. Features are consistent with a pseudonormal   left ventricular filling pattern, with concomitant abnormal   relaxation and increased filling pressure (grade 2 diastolic   dysfunction). - Mitral valve: Calcified annulus. Mildly thickened leaflets . - Left atrium: The atrium was mildly to moderately dilated.  Impressions:  - Normal LV systolic function; severe LVH; moderate diastolic   dysfunction; mild to moderate LAE.  NST: 05/2017  T wave inversions in inferior leads and nonspecific T wave abnormalities in V6 seen  throughout study.  The study is normal. No myocardial ischemia or scar.  This is a low risk study.  Nuclear stress EF: 56%.   Event Monitor: 07/2017  14 day event monitor  Min HR 51, Max HR 115, Avg HR 69  Telemetry tracings show sinus rhythm and rate controlled atrial fibrillation  Reported symptoms correlated with rate controlled afib  Assessment:    1. Coronary artery disease involving native coronary artery of native heart without angina pectoris   2. Paroxysmal atrial flutter ([facility])   3. Chronic diastolic heart failure ([place])   4. Essential hypertension   5. Mixed hyperlipidemia   6. Stage 4 chronic kidney disease ([place])      Plan:   In order of problems listed above:  1. CAD - s/p prior stenting of RCA in 1999 with cath in 2003 showing moderate CAD. Most recent NST in in 05/2017 was low-risk and showed no evidence of significant ischemia.  - he denies any recent chest pain or dyspnea on exertion. No plans for further ischemic evaluation at this time in the setting of his low-risk NST.  - continue BB and statin therapy. No ASA secondary to the need for anticoagulation.   2. Paroxysmal atrial flutter  - recent cardiac event monitor showed episodes of NSR and rate-controlled atrial fibrillation with reported symptoms correlating with atrial fibrillation. HR varied from 51 to 115 bpm with an average HR of 69. - he does experience occasional palpitations which usually resolve within 30 to 40 minutes. Says this has significantly decreased in frequency since his last visit. With his recent monitor showing HR was overall well-controlled, will continue on Coreg 25mg  BID. He is on the max dose of this medication. Will provide with an Rx for PRN short-acting Cardizem to take for persistent palpitations. He is not interested in antiarrhythmic therapy at this time.  - remains on Eliquis for anticoagulation.  3. Chronic Diastolic CHF - denies any recent dyspnea on exertion,  orthopnea, PND, or lower extremity edema. Weight has actually declined by 15 lbs since his last visit.  - continue current medication regimen.   4. HTN - BP is well-controlled at 138/74 during today's visit.  - continue current medication regimen.   5. HLD - followed by PCP. Remains on Crestor 20mg  daily. Goal LDL < 70 with known CAD.   6. Stage 4 CKD - followed by Nephrology. Will defer changes in diuretic therapy to Dr. Lowanda Foster.   Medication Adjustments/Labs and Tests Ordered: Current medicines are reviewed at length with the patient today.  Concerns regarding medicines are outlined above.  Medication changes, Labs and Tests ordered today are listed in the Patient Instructions below. Patient Instructions  Medication Instructions:  Start cardizem 30 mg - take as needed for palpitations   Labwork: none  Testing/Procedures: none  Follow-Up: Your physician wants you to follow-up in: 6 months  You will receive a reminder letter in the mail two months in advance. If you don't receive a letter, please call our office to schedule the follow-up appointment.   Any Other Special Instructions Will Be Listed Below (If Applicable).  If you need a refill on your cardiac medications before your next appointment, please call your pharmacy.    Signed, Erma Heritage, PA-C  09/04/2017 7:36 PM    [facility] [address] [place], [place], [place] [address] Phone: [phone]

## 2017-09-04 ENCOUNTER — Encounter: Payer: Self-pay | Admitting: Student

## 2017-09-04 ENCOUNTER — Ambulatory Visit (HOSPITAL_COMMUNITY)
Admission: RE | Admit: 2017-09-04 | Discharge: 2017-09-04 | Disposition: A | Discharge status: Home / Self Care | Payer: Medicare Other | Source: Ambulatory Visit | Attending: Family Medicine | Admitting: Family Medicine

## 2017-09-04 ENCOUNTER — Ambulatory Visit (INDEPENDENT_AMBULATORY_CARE_PROVIDER_SITE_OTHER): Payer: Medicare Other | Admitting: Student

## 2017-09-04 VITALS — BP 138/74 | HR 67 | Ht 76.0 in | Wt 242.0 lb

## 2017-09-04 DIAGNOSIS — I700 Atherosclerosis of aorta: Secondary | ICD-10-CM | POA: Insufficient documentation

## 2017-09-04 DIAGNOSIS — N184 Chronic kidney disease, stage 4 (severe): Secondary | ICD-10-CM

## 2017-09-04 DIAGNOSIS — E782 Mixed hyperlipidemia: Secondary | ICD-10-CM

## 2017-09-04 DIAGNOSIS — R918 Other nonspecific abnormal finding of lung field: Secondary | ICD-10-CM | POA: Diagnosis not present

## 2017-09-04 DIAGNOSIS — I5032 Chronic diastolic (congestive) heart failure: Secondary | ICD-10-CM

## 2017-09-04 DIAGNOSIS — I2510 Atherosclerotic heart disease of native coronary artery without angina pectoris: Secondary | ICD-10-CM | POA: Diagnosis not present

## 2017-09-04 DIAGNOSIS — I10 Essential (primary) hypertension: Secondary | ICD-10-CM

## 2017-09-04 DIAGNOSIS — J439 Emphysema, unspecified: Secondary | ICD-10-CM | POA: Insufficient documentation

## 2017-09-04 DIAGNOSIS — I4892 Unspecified atrial flutter: Secondary | ICD-10-CM | POA: Diagnosis not present

## 2017-09-04 MED ORDER — DILTIAZEM HCL 30 MG PO TABS: 30.0000 mg | ORAL_TABLET | Freq: Two times a day (BID) | ORAL | 6 refills | Status: DC | PRN

## 2017-09-04 MED ORDER — DILTIAZEM HCL 30 MG PO TABS: 30.0000 mg | ORAL_TABLET | Freq: Four times a day (QID) | ORAL | 6 refills | Status: DC

## 2017-09-04 NOTE — Patient Instructions (Signed)
Medication Instructions:  Start cardizem 30 mg - take as needed for palpitations   Labwork: none  Testing/Procedures: none  Follow-Up: Your physician wants you to follow-up in: 6 months  You will receive a reminder letter in the mail two months in advance. If you don't receive a letter, please call our office to schedule the follow-up appointment.   Any Other Special Instructions Will Be Listed Below (If Applicable).     If you need a refill on your cardiac medications before your next appointment, please call your pharmacy.

## 2017-09-16 DIAGNOSIS — R195 Other fecal abnormalities: Secondary | ICD-10-CM | POA: Diagnosis not present

## 2017-09-16 DIAGNOSIS — K2970 Gastritis, unspecified, without bleeding: Secondary | ICD-10-CM | POA: Diagnosis not present

## 2017-09-16 DIAGNOSIS — D126 Benign neoplasm of colon, unspecified: Secondary | ICD-10-CM | POA: Diagnosis not present

## 2017-09-16 DIAGNOSIS — D500 Iron deficiency anemia secondary to blood loss (chronic): Secondary | ICD-10-CM | POA: Diagnosis not present

## 2017-09-16 DIAGNOSIS — Z86010 Personal history of colonic polyps: Secondary | ICD-10-CM | POA: Diagnosis not present

## 2017-09-16 DIAGNOSIS — K635 Polyp of colon: Secondary | ICD-10-CM | POA: Diagnosis not present

## 2017-09-16 DIAGNOSIS — K319 Disease of stomach and duodenum, unspecified: Secondary | ICD-10-CM | POA: Diagnosis not present

## 2017-09-16 DIAGNOSIS — K3189 Other diseases of stomach and duodenum: Secondary | ICD-10-CM | POA: Diagnosis not present

## 2017-09-22 DIAGNOSIS — K635 Polyp of colon: Secondary | ICD-10-CM | POA: Diagnosis not present

## 2017-09-22 DIAGNOSIS — D126 Benign neoplasm of colon, unspecified: Secondary | ICD-10-CM | POA: Diagnosis not present

## 2017-09-22 DIAGNOSIS — L97511 Non-pressure chronic ulcer of other part of right foot limited to breakdown of skin: Secondary | ICD-10-CM | POA: Diagnosis not present

## 2017-09-22 DIAGNOSIS — E1142 Type 2 diabetes mellitus with diabetic polyneuropathy: Secondary | ICD-10-CM | POA: Diagnosis not present

## 2017-09-22 DIAGNOSIS — K319 Disease of stomach and duodenum, unspecified: Secondary | ICD-10-CM | POA: Diagnosis not present

## 2017-09-28 DIAGNOSIS — E785 Hyperlipidemia, unspecified: Secondary | ICD-10-CM | POA: Diagnosis present

## 2017-09-28 DIAGNOSIS — G939 Disorder of brain, unspecified: Secondary | ICD-10-CM | POA: Diagnosis not present

## 2017-09-28 DIAGNOSIS — E876 Hypokalemia: Secondary | ICD-10-CM | POA: Diagnosis not present

## 2017-09-28 DIAGNOSIS — Z45018 Encounter for adjustment and management of other part of cardiac pacemaker: Secondary | ICD-10-CM | POA: Diagnosis not present

## 2017-09-28 DIAGNOSIS — F17200 Nicotine dependence, unspecified, uncomplicated: Secondary | ICD-10-CM | POA: Diagnosis not present

## 2017-09-28 DIAGNOSIS — R9431 Abnormal electrocardiogram [ECG] [EKG]: Secondary | ICD-10-CM | POA: Diagnosis not present

## 2017-09-28 DIAGNOSIS — I480 Paroxysmal atrial fibrillation: Secondary | ICD-10-CM | POA: Diagnosis present

## 2017-09-28 DIAGNOSIS — E119 Type 2 diabetes mellitus without complications: Secondary | ICD-10-CM | POA: Diagnosis not present

## 2017-09-28 DIAGNOSIS — I10 Essential (primary) hypertension: Secondary | ICD-10-CM | POA: Diagnosis not present

## 2017-09-28 DIAGNOSIS — N189 Chronic kidney disease, unspecified: Secondary | ICD-10-CM | POA: Diagnosis not present

## 2017-09-28 DIAGNOSIS — D631 Anemia in chronic kidney disease: Secondary | ICD-10-CM | POA: Diagnosis present

## 2017-09-28 DIAGNOSIS — Z7901 Long term (current) use of anticoagulants: Secondary | ICD-10-CM | POA: Diagnosis not present

## 2017-09-28 DIAGNOSIS — I495 Sick sinus syndrome: Secondary | ICD-10-CM | POA: Diagnosis not present

## 2017-09-28 DIAGNOSIS — I255 Ischemic cardiomyopathy: Secondary | ICD-10-CM | POA: Diagnosis present

## 2017-09-28 DIAGNOSIS — S060X0A Concussion without loss of consciousness, initial encounter: Secondary | ICD-10-CM | POA: Diagnosis not present

## 2017-09-28 DIAGNOSIS — I6523 Occlusion and stenosis of bilateral carotid arteries: Secondary | ICD-10-CM | POA: Diagnosis not present

## 2017-09-28 DIAGNOSIS — M549 Dorsalgia, unspecified: Secondary | ICD-10-CM | POA: Diagnosis not present

## 2017-09-28 DIAGNOSIS — W19XXXA Unspecified fall, initial encounter: Secondary | ICD-10-CM | POA: Diagnosis not present

## 2017-09-28 DIAGNOSIS — R079 Chest pain, unspecified: Secondary | ICD-10-CM | POA: Diagnosis not present

## 2017-09-28 DIAGNOSIS — R0989 Other specified symptoms and signs involving the circulatory and respiratory systems: Secondary | ICD-10-CM | POA: Diagnosis not present

## 2017-09-28 DIAGNOSIS — N179 Acute kidney failure, unspecified: Secondary | ICD-10-CM | POA: Diagnosis not present

## 2017-09-28 DIAGNOSIS — Z955 Presence of coronary angioplasty implant and graft: Secondary | ICD-10-CM | POA: Diagnosis not present

## 2017-09-28 DIAGNOSIS — R569 Unspecified convulsions: Secondary | ICD-10-CM | POA: Diagnosis not present

## 2017-09-28 DIAGNOSIS — R001 Bradycardia, unspecified: Secondary | ICD-10-CM | POA: Diagnosis not present

## 2017-09-28 DIAGNOSIS — I482 Chronic atrial fibrillation: Secondary | ICD-10-CM | POA: Diagnosis not present

## 2017-09-28 DIAGNOSIS — Z743 Need for continuous supervision: Secondary | ICD-10-CM | POA: Diagnosis not present

## 2017-09-28 DIAGNOSIS — I358 Other nonrheumatic aortic valve disorders: Secondary | ICD-10-CM | POA: Diagnosis not present

## 2017-09-28 DIAGNOSIS — J449 Chronic obstructive pulmonary disease, unspecified: Secondary | ICD-10-CM | POA: Diagnosis not present

## 2017-09-28 DIAGNOSIS — R55 Syncope and collapse: Secondary | ICD-10-CM | POA: Diagnosis not present

## 2017-09-28 DIAGNOSIS — N184 Chronic kidney disease, stage 4 (severe): Secondary | ICD-10-CM | POA: Diagnosis not present

## 2017-09-28 DIAGNOSIS — F17210 Nicotine dependence, cigarettes, uncomplicated: Secondary | ICD-10-CM | POA: Diagnosis present

## 2017-09-28 DIAGNOSIS — Z794 Long term (current) use of insulin: Secondary | ICD-10-CM | POA: Diagnosis not present

## 2017-09-28 DIAGNOSIS — I4892 Unspecified atrial flutter: Secondary | ICD-10-CM | POA: Diagnosis not present

## 2017-09-28 DIAGNOSIS — R0609 Other forms of dyspnea: Secondary | ICD-10-CM | POA: Diagnosis not present

## 2017-09-28 DIAGNOSIS — I130 Hypertensive heart and chronic kidney disease with heart failure and stage 1 through stage 4 chronic kidney disease, or unspecified chronic kidney disease: Secondary | ICD-10-CM | POA: Diagnosis present

## 2017-09-28 DIAGNOSIS — E1122 Type 2 diabetes mellitus with diabetic chronic kidney disease: Secondary | ICD-10-CM | POA: Diagnosis present

## 2017-09-28 DIAGNOSIS — I5032 Chronic diastolic (congestive) heart failure: Secondary | ICD-10-CM | POA: Diagnosis present

## 2017-09-28 DIAGNOSIS — I2510 Atherosclerotic heart disease of native coronary artery without angina pectoris: Secondary | ICD-10-CM | POA: Diagnosis not present

## 2017-09-28 DIAGNOSIS — I340 Nonrheumatic mitral (valve) insufficiency: Secondary | ICD-10-CM | POA: Diagnosis not present

## 2017-09-28 DIAGNOSIS — I455 Other specified heart block: Secondary | ICD-10-CM | POA: Diagnosis present

## 2017-10-06 DIAGNOSIS — L851 Acquired keratosis [keratoderma] palmaris et plantaris: Secondary | ICD-10-CM | POA: Diagnosis not present

## 2017-10-06 DIAGNOSIS — E1142 Type 2 diabetes mellitus with diabetic polyneuropathy: Secondary | ICD-10-CM | POA: Diagnosis not present

## 2017-10-16 ENCOUNTER — Encounter: Payer: Self-pay | Admitting: Cardiology

## 2017-10-16 ENCOUNTER — Ambulatory Visit (INDEPENDENT_AMBULATORY_CARE_PROVIDER_SITE_OTHER): Payer: Medicare Other | Admitting: Cardiology

## 2017-10-16 VITALS — BP 158/80 | HR 60 | Ht 76.0 in | Wt 239.4 lb

## 2017-10-16 DIAGNOSIS — R55 Syncope and collapse: Secondary | ICD-10-CM

## 2017-10-16 DIAGNOSIS — I2510 Atherosclerotic heart disease of native coronary artery without angina pectoris: Secondary | ICD-10-CM | POA: Diagnosis not present

## 2017-10-16 DIAGNOSIS — I480 Paroxysmal atrial fibrillation: Secondary | ICD-10-CM | POA: Diagnosis not present

## 2017-10-16 DIAGNOSIS — I495 Sick sinus syndrome: Secondary | ICD-10-CM | POA: Diagnosis not present

## 2017-10-16 NOTE — Progress Notes (Signed)
Clinical Summary Mr. Guard is a 67 y.o.male seen today for follow up of the following medical problems. This is a focused visit on patients history of aflutter/afib and recent syncope and pacemaker placement. Extensive other cardiac history, please refer to prior clinic notes.   1. Aflutter/Afib -recent monitor showed symptoms correlated with overall rate controlled afib - continued on coreg, started on prn dilt at that time - no recent palpitatoins.        2. Syncope - admit 09/28/17 to [place] [place] hospital with dizziness and syncope. - episode with vomiting, diffuse shaking. BUN 54, Cr 3.1 K 3.2 - EKG showed sinus brady to 46 - CT head no acute process. EEG normal. Carotid US negative.  - echo 09/2017 unclear report. States "normal LV function"but body or report indicates LVEF 35%.   3. Tachy-brady syndrome.  - notes indicate significant sinus bradycardia, sinus arrest. 5 second pause noted during recent admission - had dual chamber pacemaker placed. [employer] dual chamber pacemaker placed,  - normal TSH during admission  - stood up from sitting, felt like needed to have a bowel movement. Stood up and walked to bathroom, fell to floor.  - heart rates 40s, up to 5 second pauses.   - no symptoms since device placement.       4. Carotid stenosis - noted during recent admission for syncope.  - RICA <74%, LICA 25-95%  Past Medical History:  Diagnosis Date  . Asthma   . Atrial flutter ([place])   . CAD (coronary artery disease)    a. s/p prior stenting of RCA in 1999 b. cath in 2003 showing moderate CAD c. NST in 2016 showing small area of ischemic and low-risk  . Chronic renal insufficiency   . COPD (chronic obstructive pulmonary disease) ([place])    with ongoing tobacco use and patient failed sham takes  . DM2 (diabetes mellitus, type 2) ([place])   . Dyslipidemia   . Edema    chronic lower extremity secondary to right heart failure and chronic venous  insufficiency  . Headache   . HTN (hypertension)   . Morbid obesity ([place])   . Pneumonia   . 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   Marland Kitchen Reflux esophagitis    hx  . Renal insufficiency   . Sleep apnea   . Two-vessel coronary artery disease    moderate. by cath in 2003. Status post stenting of the mdi RCA November 1999 normal left ventricular ejection fraction     No Known Allergies   Current Outpatient Medications  Medication Sig Dispense Refill  . acetaminophen (TYLENOL) 325 MG tablet Take 975 mg daily by mouth. 3 tablets every morning    . albuterol (VENTOLIN HFA) 108 (90 BASE) MCG/ACT inhaler Inhale 2 puffs into the lungs every 6 (six) hours as needed for wheezing or shortness of breath.     . carvedilol (COREG) 25 MG tablet Take 1 tablet (25 mg total) by mouth 2 (two) times daily. 180 tablet 3  . diltiazem (CARDIZEM) 30 MG tablet Take 1 tablet (30 mg total) by mouth 2 (two) times daily as needed (As needed for palpitations.). 30 tablet 6  . ELIQUIS 5 MG TABS tablet TAKE ONE TABLET BY MOUTH TWICE DAILY. 60 tablet 3  . FERREX 150 150 MG capsule Take 150 mg by mouth daily.  4  . hydrALAZINE (APRESOLINE) 100 MG tablet TAKE ONE TABLET BY MOUTH THREE TIMES  DAILY 90 tablet 3  . insulin glargine (LANTUS) 100 UNIT/ML injection Inject 35 Units daily after breakfast into the skin.     Marland Kitchen LINZESS 290 MCG CAPS capsule Take 290 mcg by mouth daily as needed (for constipation).   6  . Multiple Vitamin (MULTIVITAMIN) tablet Take 1 tablet by mouth daily.      Marland Kitchen omeprazole (PRILOSEC) 20 MG capsule Take 20 mg daily by mouth.    . potassium chloride SA (K-DUR,KLOR-CON) 20 MEQ tablet Take 1 tablet (20 mEq total) by mouth daily. 30 tablet 0  . rosuvastatin (CRESTOR) 20 MG tablet TAKE ONE TABLET BY MOUTH DAILY 30 tablet 3  . silver sulfADIAZINE (SILVADENE) 1 % cream Apply 1 application daily topically. For wound care with dressing    . torsemide  (DEMADEX) 20 MG tablet Take 2 tablets (40 mg total) by mouth 2 (two) times daily. 120 tablet 6   No current facility-administered medications for this visit.      Past Surgical History:  Procedure Laterality Date  . ABDOMINAL SURGERY    . APPENDECTOMY    . BACK SURGERY    . CARDIAC CATHETERIZATION     stent  . CHOLECYSTECTOMY    . CLOSED REDUCTION SHOULDER DISLOCATION    . diabetic ulcers    . GROIN EXPLORATION    . OSTECTOMY Left 04/23/2017   Procedure: OSTECTOMY LEFT GREAT TOE;  Surgeon: [name], DPM;  Location: [facility];  Service: Podiatry;  Laterality: Left;  left great toe  . TUMOR REMOVAL     small intestine      No Known Allergies    Family History  Problem Relation Age of Onset  . Diabetes Mother   . Alcohol abuse Father      Social History Mr. Stiggers reports that he has been smoking cigarettes.  He started smoking about 49 years ago. He has a 40.00 pack-year smoking history. He has never used smokeless tobacco. Mr. Schetter reports that he does not drink alcohol.   Review of Systems CONSTITUTIONAL: No weight loss, fever, chills, weakness or fatigue.  HEENT: Eyes: No visual loss, blurred vision, double vision or yellow sclerae.No hearing loss, sneezing, congestion, runny nose or sore throat.  SKIN: No rash or itching.  CARDIOVASCULAR: per hpi RESPIRATORY: per hpi GASTROINTESTINAL: No anorexia, nausea, vomiting or diarrhea. No abdominal pain or blood.  GENITOURINARY: No burning on urination, no polyuria NEUROLOGICAL: No headache, dizziness, syncope, paralysis, ataxia, numbness or tingling in the extremities. No change in bowel or bladder control.  MUSCULOSKELETAL: No muscle, back pain, joint pain or stiffness.  LYMPHATICS: No enlarged nodes. No history of splenectomy.  PSYCHIATRIC: No history of depression or anxiety.  ENDOCRINOLOGIC: No reports of sweating, cold or heat intolerance. No polyuria or polydipsia.  Marland Kitchen   Physical Examination Vitals:    10/16/17 1414  BP: (!) 158/80  Pulse: 60  SpO2: 97%   Vitals:   10/16/17 1414  Weight: 239 lb 6.4 oz (108.6 kg)  Height: 6\' 4"  (1.93 m)    Gen: resting comfortably, no acute distress HEENT: no scleral icterus, pupils equal round and reactive, no palptable cervical adenopathy,  CV: RRR, no m/r/,g no jvd Resp: Clear to auscultation bilaterally GI: abdomen is soft, non-tender, non-distended, normal bowel sounds, no hepatosplenomegaly MSK: extremities are warm, no edema.  Skin: warm, no rash Neuro:  no focal deficits Psych: appropriate affect   Diagnostic Studies 05/2001 Cath FINDINGS:  1. Left main trunk: Medium caliber vessel. This is a long vessel  with a  distal taper of 20-30%.  2. Left anterior descending artery: This is a medium caliber vessel that  provides a large bifurcating first diagonal Coletta Lockner in the proximal segment  and before then extending to the apex, the LAD tapers significantly after  the diagonal Mical Kicklighter. There is moderate disease of 50-60% after the  diagonal Javaughn Opdahl and then a focal eccentric narrowing of 60-70% in the mid  section of the LAD. The diagonal Reyce Lubeck has moderate disease of 30-40% in  the proximal segment. The lateral division is a small caliber vessel with  an ostial narrowing of 60%.  3. Left circumflex artery: This is a small caliber vessel that provides a  trivial first marginal Amabel Stmarie in the mid section and a medium caliber  second marginal Breah Joa distally. The mid AV circumflex at the takeoff of  the first and second diagonal branches had moderate diffuse disease of  60-70%.  4. Right coronary artery: Dominant. This is a large caliber vessel that  provides a posterior descending artery and two posteroventricular branches  in the terminal segment. The right coronary artery has evidence of an  existing stent in the mid section that is widely patent. Prior to stent is  a focal discrete narrowing of 40-50%. Distal to the  stent is moderate  disease of 30%. The distal Umberto Pavek vessels also have mild disease of 30%.  5. Left ventricle: Normal end-systolic and end-diastolic dimensions. Overall  left ventricular function is well preserved. Ejection fraction is greater  than 65%. No mitral regurgitation. LV pressure is 180/10, aortic is  180/90. LV EDP equals 20.  ASSESSMENT AND PLAN: Mr. Lewan is a 67 year old gentleman with moderate  three-vessel coronary artery disease. The patients plaque burden in the left  anterior descending artery and circumflex appears to have increased somewhat  compared to his previous angiogram. Unfortunately, the mid and distal left  anterior descending artery is a small caliber vessel with a long diffuse  segment of disease and would be associated with a high restenosis rate.  Similarly, the circumflex would suffer the same fate.  Further assessment with a stress imaging study would be prudent to isolate the  area of ischemia. Percutaneous intervention may then be targeted if  indicated. In addition, aggressive medical therapy should be pursued as the  patient currently is not on any anti-anginal therapy and has poorly controlled  hypertension. The patient will also be counseled regarding smoking cessation.    07/2013 PFTs Mild obstruction    10/2014 Lexiscan  Unable to adequately ambulate to treadmill due to right hip pain. Lexiscan employed.   Defect 1: There is a small defect of mild severity present in the mid inferior and apical inferior location. This defect is partially reversible.   This is a low risk study.   Nuclear stress EF: 58%.   Small region of apical inferior ischemia    07/2015 Echo Study Conclusions  - Left ventricle: The cavity size was normal. Wall thickness was  increased increased in a pattern of mild to moderate LVH.  Systolic function was normal. The estimated ejection fraction was  in the range of 60% to 65%.  Diastolic function is abnormal,  indeterminate grade. Wall motion was normal; there were no  regional wall motion abnormalities. - Aortic valve: Mildly calcified annulus. Trileaflet; mildly  thickened leaflets. Valve area (VTI): 2.39 cm^2. Valve area  (Vmax): 2.58 cm^2. Valve area (Vmean): 2.7 cm^2. - Mitral valve: Mildly calcified annulus. Normal thickness leaflets  . -  Technically adequate study.  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 Recommend followup by ultrasound in 2 years. This recommendation follows ACR consensus guidelines:   Jan 2019 nuclear stress  T wave inversions in inferior leads and nonspecific T wave abnormalities in V6 seen throughout study.  The study is normal. No myocardial ischemia or scar.  This is a low risk study.  Nuclear stress EF: 56%.   07/2017 event monitor  14 day event monitor  Min HR 51, Max HR 115, Avg HR 69  Telemetry tracings show sinus rhythm and rate controlled atrial fibrillation  Reported symptoms correlated with rate controlled afib    Assessment and Plan   1.Paroxysmal aflutter/Afib/ Tachybrady syndrome - CHADS2Vasc score is 4 (HTN, CHF, CAD, DM2), continue eliquis  - recent episode of syncope, bradycardia and sinus pauses. Pacemaker placed at outside facility. No recurrence of symptoms. Patient remains on beta blocker.  - patient had tolerated coreg for 3-4 months at current dose, had some prior mild sinus brady but no significant events including with a recent 1 week event monitor prior to his syncope episode.  - I had difficultly getting patient to understand the causes of tach-brady syndrome, he seems to feel he was on too much coreg and that's what caused him to require a pacemaker as opposed to a primary issue/change in his conduction system. I am still not sure he accepts this understanding.   - refer to  Dr Rayann Heman EP for management of pacemaker and management of his his tachy-brady syndrome.      Arnoldo Lenis, M.D.

## 2017-10-16 NOTE — Patient Instructions (Signed)
Your physician wants you to follow-up in: [place] will receive a reminder letter in the mail two months in advance. If you don't receive a letter, please call our office to schedule the follow-up appointment.  Your physician recommends that you continue on your current medications as directed. Please refer to the Current Medication list given to you today.  You have been referred to DR ALLRED ELECTROPHYSIOLOGY  Thank you for choosing [facility]!!

## 2017-10-24 DIAGNOSIS — E559 Vitamin D deficiency, unspecified: Secondary | ICD-10-CM | POA: Diagnosis not present

## 2017-10-24 DIAGNOSIS — N183 Chronic kidney disease, stage 3 (moderate): Secondary | ICD-10-CM | POA: Diagnosis not present

## 2017-10-24 DIAGNOSIS — D509 Iron deficiency anemia, unspecified: Secondary | ICD-10-CM | POA: Diagnosis not present

## 2017-10-24 DIAGNOSIS — Z79899 Other long term (current) drug therapy: Secondary | ICD-10-CM | POA: Diagnosis not present

## 2017-10-24 DIAGNOSIS — R809 Proteinuria, unspecified: Secondary | ICD-10-CM | POA: Diagnosis not present

## 2017-10-24 DIAGNOSIS — I10 Essential (primary) hypertension: Secondary | ICD-10-CM | POA: Diagnosis not present

## 2017-10-29 DIAGNOSIS — R809 Proteinuria, unspecified: Secondary | ICD-10-CM | POA: Diagnosis not present

## 2017-10-29 DIAGNOSIS — N184 Chronic kidney disease, stage 4 (severe): Secondary | ICD-10-CM | POA: Diagnosis not present

## 2017-10-29 DIAGNOSIS — N2581 Secondary hyperparathyroidism of renal origin: Secondary | ICD-10-CM | POA: Diagnosis not present

## 2017-10-29 DIAGNOSIS — D509 Iron deficiency anemia, unspecified: Secondary | ICD-10-CM | POA: Diagnosis not present

## 2017-11-03 ENCOUNTER — Encounter (HOSPITAL_COMMUNITY): Payer: Self-pay

## 2017-11-03 ENCOUNTER — Encounter (HOSPITAL_COMMUNITY)
Admission: RE | Admit: 2017-11-03 | Discharge: 2017-11-03 | Disposition: A | Discharge status: Home / Self Care | Payer: Medicare Other | Source: Ambulatory Visit | Attending: Nephrology | Admitting: Nephrology

## 2017-11-03 DIAGNOSIS — D509 Iron deficiency anemia, unspecified: Secondary | ICD-10-CM | POA: Diagnosis not present

## 2017-11-03 HISTORY — DX: Presence of cardiac pacemaker: Z95.0

## 2017-11-03 MED ORDER — SODIUM CHLORIDE 0.9 % IV SOLN
510.0000 mg | INTRAVENOUS | Status: DC
  Administered 2017-11-03: 510 mg via INTRAVENOUS
  Filled 2017-11-03: qty 17

## 2017-11-03 MED ORDER — SODIUM CHLORIDE 0.9 % IV SOLN
Freq: Once | INTRAVENOUS | Status: AC
  Administered 2017-11-03: 12:00:00 via INTRAVENOUS

## 2017-11-03 NOTE — Discharge Instructions (Signed)

## 2017-11-03 NOTE — Progress Notes (Signed)
Feraheme infusion tolerated well no s/s noted teaching performed.

## 2017-11-05 ENCOUNTER — Ambulatory Visit
Admission: RE | Admit: 2017-11-05 | Discharge: 2017-11-05 | Disposition: A | Discharge status: Home / Self Care | Payer: Medicare Other | Source: Ambulatory Visit | Attending: Internal Medicine | Admitting: Internal Medicine

## 2017-11-05 ENCOUNTER — Ambulatory Visit (INDEPENDENT_AMBULATORY_CARE_PROVIDER_SITE_OTHER): Payer: Medicare Other | Admitting: Internal Medicine

## 2017-11-05 ENCOUNTER — Encounter: Payer: Self-pay | Admitting: Internal Medicine

## 2017-11-05 VITALS — BP 158/86 | HR 60 | Ht 76.0 in | Wt 241.0 lb

## 2017-11-05 DIAGNOSIS — I480 Paroxysmal atrial fibrillation: Secondary | ICD-10-CM

## 2017-11-05 DIAGNOSIS — Z950 Presence of cardiac pacemaker: Secondary | ICD-10-CM

## 2017-11-05 DIAGNOSIS — I4892 Unspecified atrial flutter: Secondary | ICD-10-CM | POA: Diagnosis not present

## 2017-11-05 DIAGNOSIS — I10 Essential (primary) hypertension: Secondary | ICD-10-CM

## 2017-11-05 DIAGNOSIS — I495 Sick sinus syndrome: Secondary | ICD-10-CM | POA: Diagnosis not present

## 2017-11-05 DIAGNOSIS — N184 Chronic kidney disease, stage 4 (severe): Secondary | ICD-10-CM

## 2017-11-05 LAB — CUP PACEART INCLINIC DEVICE CHECK
Date Time Interrogation Session: 20190619040000
Implantable Lead Implant Date: 20190514
Implantable Lead Implant Date: 20190514
Implantable Lead Location: 753859
Implantable Lead Location: 753860
Implantable Lead Model: 7741
Implantable Lead Model: 7742
Implantable Lead Serial Number: 1015225
Implantable Lead Serial Number: 1020907
Implantable Pulse Generator Implant Date: 20190514
Lead Channel Impedance Value: 701 Ohm
Lead Channel Impedance Value: 807 Ohm
Lead Channel Pacing Threshold Amplitude: 0.8 V
Lead Channel Pacing Threshold Amplitude: 2.7 V
Lead Channel Pacing Threshold Pulse Width: 0.4 ms
Lead Channel Pacing Threshold Pulse Width: 1 ms
Lead Channel Sensing Intrinsic Amplitude: 25 mV
Lead Channel Sensing Intrinsic Amplitude: 7.8 mV
Lead Channel Setting Pacing Amplitude: 3.5 V
Lead Channel Setting Pacing Amplitude: 3.5 V
Lead Channel Setting Pacing Pulse Width: 1 ms
Lead Channel Setting Sensing Sensitivity: 1.5 mV
Pulse Gen Serial Number: 832937

## 2017-11-05 NOTE — Patient Instructions (Addendum)
Medication Instructions:  Your physician recommends that you continue on your current medications as directed. Please refer to the Current Medication list given to you today.  Labwork: None ordered.  Testing/Procedures: A chest x-ray takes a picture of the organs and structures inside the chest, including the heart, lungs, and blood vessels. This test can show several things, including, whether the heart is enlarges; whether fluid is building up in the lungs; and whether pacemaker / defibrillator leads are still in place.  Follow-Up: Your physician wants you to follow-up in: 2 months with Dr. Rayann Heman in [place].  Remote monitoring is used to monitor your Pacemaker from home. This monitoring reduces the number of office visits required to check your device to one time per year. It allows [place] to keep an eye on the functioning of your device to ensure it is working properly. You are scheduled for a device check from home on 02/04/2018. You may send your transmission at any time that day. If you have a wireless device, the transmission will be sent automatically. After your physician reviews your transmission, you will receive a postcard with your next transmission date.  Any Other Special Instructions Will Be Listed Below (If Applicable).  If you need a refill on your cardiac medications before your next appointment, please call your pharmacy.

## 2017-11-05 NOTE — Progress Notes (Signed)
Electrophysiology Office Note   Date:  11/05/2017   ID:  [name] 1951-02-27, MRN [id number]  PCP:  [name], MD  Cardiologist:  Dr [name] Primary Electrophysiologist: Thompson Grayer, MD    CC: afib   History of Present Illness: [name] is a 67 y.o. male who presents today for electrophysiology evaluation.   The patient is referred by Dr [name] for EP consultation regarding his atrial fibrillation and recent PPM implant (BSci) for sinus arrest. He has a h/o afib and atrial flutter and has been followed previously by Dr [name].  He recently was on vacation in [place] when he had syncope.  He was evaluated and had [employer] PPM implanted in [facility] hospital for sick sinus syndrome/ sinus pauses (5 second pause).   He has had afib for several years.  This was initially managed by Dr Dannielle Burn.  He has not tried AAD therapy.  He is anticoagulated with eliquis.  He has occasional palpitations which improve with prn rate control.  He snores but has not yet had sleep study.  He has COPD with SOB chronically and frequent cough.  Today, he denies symptoms of chest pain, orthopnea, PND, lower extremity edema, claudication, dizziness, presyncope, syncope, bleeding, or neurologic sequela. The patient is tolerating medications without difficulties and is otherwise without complaint today.    Past Medical History:  Diagnosis Date  . Asthma   . Atrial flutter ([place])   . CAD (coronary artery disease)    a. s/p prior stenting of RCA in 1999 b. cath in 2003 showing moderate CAD c. NST in 2016 showing small area of ischemic and low-risk  . Chronic renal insufficiency   . COPD (chronic obstructive pulmonary disease) ([place])    with ongoing tobacco use and patient failed sham takes  . DM2 (diabetes mellitus, type 2) ([place])   . Dyslipidemia   . Edema    chronic lower extremity secondary to right heart failure and chronic venous insufficiency  . Headache   . HTN  (hypertension)   . Morbid obesity ([place])   . Pneumonia   . Presence of permanent cardiac pacemaker   . 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   Marland Kitchen Reflux esophagitis    hx  . Renal insufficiency   . Sleep apnea   . Two-vessel coronary artery disease    moderate. by cath in 2003. Status post stenting of the mdi RCA November 1999 normal left ventricular ejection fraction   Past Surgical History:  Procedure Laterality Date  . ABDOMINAL SURGERY    . APPENDECTOMY    . BACK SURGERY    . CARDIAC CATHETERIZATION     stent  . CHOLECYSTECTOMY    . CLOSED REDUCTION SHOULDER DISLOCATION    . diabetic ulcers    . GROIN EXPLORATION    . OSTECTOMY Left 04/23/2017   Procedure: OSTECTOMY LEFT GREAT TOE;  Surgeon: [name], DPM;  Location: [facility];  Service: Podiatry;  Laterality: Left;  left great toe  . TUMOR REMOVAL     small intestine      Current Outpatient Medications  Medication Sig Dispense Refill  . acetaminophen (TYLENOL) 325 MG tablet Take 975 mg daily by mouth. 3 tablets every morning    . albuterol (VENTOLIN HFA) 108 (90 BASE) MCG/ACT inhaler Inhale 2 puffs into the lungs every 6 (six) hours as needed for wheezing or shortness of breath.     Marland Kitchen  Cholecalciferol (VITAMIN D3) 2000 units TABS Take 1 tablet by mouth daily.    Marland Kitchen diltiazem (CARDIZEM) 30 MG tablet Take 1 tablet (30 mg total) by mouth 2 (two) times daily as needed (As needed for palpitations.). 30 tablet 6  . ELIQUIS 5 MG TABS tablet TAKE ONE TABLET BY MOUTH TWICE DAILY. 60 tablet 3  . hydrALAZINE (APRESOLINE) 100 MG tablet TAKE ONE TABLET BY MOUTH THREE TIMES DAILY 90 tablet 3  . insulin glargine (LANTUS) 100 UNIT/ML injection Inject 35 Units daily after breakfast into the skin.     Marland Kitchen LINZESS 290 MCG CAPS capsule Take 290 mcg by mouth daily as needed (for constipation).   6  . Melatonin 10 MG TABS Take 30 mg by mouth at bedtime.    . Multiple Vitamin  (MULTIVITAMIN) tablet Take 1 tablet by mouth daily.      Marland Kitchen omeprazole (PRILOSEC) 20 MG capsule Take 20 mg daily by mouth.    . polyethylene glycol (MIRALAX / GLYCOLAX) packet Take 17 g by mouth every morning.    . potassium chloride SA (K-DUR,KLOR-CON) 20 MEQ tablet Take 1 tablet (20 mEq total) by mouth daily. 30 tablet 0  . QVAR REDIHALER 80 MCG/ACT inhaler Inhale 2 puffs into the lungs 2 (two) times daily.  12  . rosuvastatin (CRESTOR) 20 MG tablet TAKE ONE TABLET BY MOUTH DAILY 30 tablet 3  . silver sulfADIAZINE (SILVADENE) 1 % cream Apply 1 application daily topically. For wound care with dressing    . torsemide (DEMADEX) 100 MG tablet Take 50 mg by mouth 2 (two) times daily. Changed by Dr. Lowanda Foster this March.  4  . carvedilol (COREG) 25 MG tablet Take 1 tablet (25 mg total) by mouth 2 (two) times daily. 180 tablet 3   No current facility-administered medications for this visit.     Allergies:   Patient has no known allergies.   Social History:  The patient  reports that he has been smoking cigarettes.  He started smoking about 49 years ago. He has a 40.00 pack-year smoking history. He has never used smokeless tobacco. He reports that he does not drink alcohol or use drugs.   Family History:  The patient's family history includes Alcohol abuse in his father; Diabetes in his mother.    ROS:  Please see the history of present illness.   All other systems are personally reviewed and negative.    PHYSICAL EXAM: VS:  BP (!) 158/86   Pulse 60   Ht 6\' 4"  (1.93 m)   Wt 241 lb (109.3 kg)   BMI 29.34 kg/m  , BMI Body mass index is 29.34 kg/m. GEN: Well nourished, well developed, in no acute distress  HEENT: normal  Neck: no JVD, carotid bruits, or masses Cardiac: iRRR; no murmurs, rubs, or gallops,no edema  Respiratory:  clear to auscultation bilaterally, normal work of breathing GI: soft, nontender, nondistended, + BS MS: no deformity or atrophy  Skin: warm and dry  Neuro:   Strength and sensation are intact Psych: euthymic mood, full affect  EKG:  EKG is ordered today. The ekg ordered today is personally reviewed and shows atrial paced rhythm 60 bpm, PR 228 msec, nonspecific ST/T changes   Recent Labs: 05/09/2017: ALT 9; B Natriuretic Peptide 293.0; Hemoglobin 14.7; Platelets 293 08/06/2017: BUN 55; Creatinine, Ser 3.23; Magnesium 2.3; Potassium 4.6; Sodium 142  personally reviewed   Lipid Panel  No results found for: CHOL, TRIG, HDL, CHOLHDL, VLDL, LDLCALC, LDLDIRECT personally reviewed  Wt Readings from Last 3 Encounters:  11/05/17 241 lb (109.3 kg)  10/16/17 239 lb 6.4 oz (108.6 kg)  09/04/17 242 lb (109.8 kg)      Other studies personally reviewed: Additional studies/ records that were reviewed today include: records from Dr [name], prior echo  Review of the above records today demonstrates: as above   ASSESSMENT AND PLAN:  1.  Tachy/brady syndrome with post termination pauses S/p PPM implant in [place] 09/30/17 with a BSci [place] MRI EL PPM Device interrogation today reveals normal device function with 21% atrial pacing and 30% V pacing.  I have reprogrammed today to turn Rhythm IQ onto prevent V pacing.  Of note, his RV lead threshold is elevated today at 4V @0 .4 msec (2.7V@1msec ).  impendance and RV sensing are normal.  Denies any symptoms of lead perforation.  I will obtain CXR to evaluate for lead dislodgement.  I suspect micro dislodgement.  For now, he should do fine programmed 3.5V@1  msec given that I do not expect him to V pace often.  We can discuss feasibility of lead revision on return.    2. Paroxysmal atrial fibrillation/ atrial flutter 6% afib by device interrogation He has symptoms which appear mostly controlled with rate control His baseline creatinine is about 3.  Our only AAD option is amiodarone.  Give his relative paucity of symptoms, I am not convinced that amiodarone is warranted.   Though we could consider ablation, I  also worry about his bleeding risks on eliquis with creatinine that high.  In addition, we need to sort out his RV lead failure issues first.  3. snoring I have strongly advised sleep study.  He wishes to discuss with PCP rather than have our office perform.  4. Smoking Cessation advised  5. CAD No ischemic symptoms  6. COPD Smoking cessation advised  7. Chronic stage IV renal failure with anemia as above Would consider switching eliquis to coumadin given bleeding risks   Follow-up:  Return to see me in [place] in 2 months.  May consider more urgent following pending results of CXR for lead positioning.  Current medicines are reviewed at length with the patient today.   The patient does not have concerns regarding his medicines.  The following changes were made today:  none  Labs/ tests ordered today include:  Orders Placed This Encounter  Procedures  . EKG 12-Lead     Signed, Thompson Grayer, MD  11/05/2017 3:19 PM     [place] [address] [phone] (office) [phone] (fax)

## 2017-11-10 ENCOUNTER — Encounter (HOSPITAL_COMMUNITY)
Admission: RE | Admit: 2017-11-10 | Discharge: 2017-11-10 | Disposition: A | Discharge status: Home / Self Care | Payer: Medicare Other | Source: Ambulatory Visit | Attending: Nephrology | Admitting: Nephrology

## 2017-11-10 ENCOUNTER — Encounter (HOSPITAL_COMMUNITY): Payer: Self-pay

## 2017-11-10 DIAGNOSIS — D509 Iron deficiency anemia, unspecified: Secondary | ICD-10-CM | POA: Diagnosis not present

## 2017-11-10 MED ORDER — SODIUM CHLORIDE 0.9 % IV SOLN
Freq: Once | INTRAVENOUS | Status: AC
  Administered 2017-11-10: 12:00:00 via INTRAVENOUS

## 2017-11-10 MED ORDER — SODIUM CHLORIDE 0.9 % IV SOLN
510.0000 mg | Freq: Once | INTRAVENOUS | Status: AC
  Administered 2017-11-10: 510 mg via INTRAVENOUS
  Filled 2017-11-10: qty 17

## 2017-11-26 DIAGNOSIS — H2513 Age-related nuclear cataract, bilateral: Secondary | ICD-10-CM | POA: Diagnosis not present

## 2017-11-26 DIAGNOSIS — E119 Type 2 diabetes mellitus without complications: Secondary | ICD-10-CM | POA: Diagnosis not present

## 2017-12-01 ENCOUNTER — Encounter: Payer: Self-pay | Admitting: Neurology

## 2017-12-02 ENCOUNTER — Ambulatory Visit (INDEPENDENT_AMBULATORY_CARE_PROVIDER_SITE_OTHER): Payer: Medicare Other | Admitting: Neurology

## 2017-12-02 ENCOUNTER — Encounter: Payer: Self-pay | Admitting: Neurology

## 2017-12-02 ENCOUNTER — Other Ambulatory Visit: Payer: Self-pay | Admitting: Cardiology

## 2017-12-02 VITALS — BP 172/88 | HR 59 | Ht 76.0 in | Wt 240.0 lb

## 2017-12-02 DIAGNOSIS — I2510 Atherosclerotic heart disease of native coronary artery without angina pectoris: Secondary | ICD-10-CM | POA: Diagnosis not present

## 2017-12-02 DIAGNOSIS — Z950 Presence of cardiac pacemaker: Secondary | ICD-10-CM

## 2017-12-02 DIAGNOSIS — R001 Bradycardia, unspecified: Secondary | ICD-10-CM | POA: Diagnosis not present

## 2017-12-02 DIAGNOSIS — R062 Wheezing: Secondary | ICD-10-CM | POA: Diagnosis not present

## 2017-12-02 DIAGNOSIS — R0683 Snoring: Secondary | ICD-10-CM | POA: Diagnosis not present

## 2017-12-02 DIAGNOSIS — G4733 Obstructive sleep apnea (adult) (pediatric): Secondary | ICD-10-CM

## 2017-12-02 DIAGNOSIS — G4701 Insomnia due to medical condition: Secondary | ICD-10-CM | POA: Diagnosis not present

## 2017-12-02 DIAGNOSIS — I2584 Coronary atherosclerosis due to calcified coronary lesion: Secondary | ICD-10-CM | POA: Diagnosis not present

## 2017-12-02 DIAGNOSIS — R0601 Orthopnea: Secondary | ICD-10-CM | POA: Diagnosis not present

## 2017-12-02 DIAGNOSIS — J449 Chronic obstructive pulmonary disease, unspecified: Secondary | ICD-10-CM | POA: Diagnosis not present

## 2017-12-02 MED ORDER — ALPRAZOLAM 0.5 MG PO TABS: 0.5000 mg | ORAL_TABLET | Freq: Every evening | ORAL | 0 refills | Status: DC | PRN

## 2017-12-02 NOTE — Patient Instructions (Addendum)
CPAP and BiPAP Information CPAP and BiPAP are methods of helping a person breathe with the use of air pressure. CPAP stands for "continuous positive airway pressure." BiPAP stands for "bi-level positive airway pressure." In both methods, air is blown through your nose or mouth and into your air passages to help you breathe well. CPAP and BiPAP use different amounts of pressure to blow air. With CPAP, the amount of pressure stays the same while you breathe in and out. With BiPAP, the amount of pressure is increased when you breathe in (inhale) so that you can take larger breaths. Your health care provider will recommend whether CPAP or BiPAP would be more helpful for you. Why are CPAP and BiPAP treatments used? CPAP or BiPAP can be helpful if you have:  Sleep apnea.  Chronic obstructive pulmonary disease (COPD).  Heart failure.  Medical conditions that weaken the muscles of the chest including muscular dystrophy, or neurological diseases such as amyotrophic lateral sclerosis (ALS).  Other problems that cause breathing to be weak, abnormal, or difficult.  CPAP is most commonly used for obstructive sleep apnea (OSA) to keep the airways from collapsing when the muscles relax during sleep. How is CPAP or BiPAP administered? Both CPAP and BiPAP are provided by a small machine with a flexible plastic tube that attaches to a plastic mask. You wear the mask. Air is blown through the mask into your nose or mouth. The amount of pressure that is used to blow the air can be adjusted on the machine. Your health care provider will determine the pressure setting that should be used based on your individual needs. When should CPAP or BiPAP be used? In most cases, the mask only needs to be worn during sleep. Generally, the mask needs to be worn throughout the night and during any daytime naps. People with certain medical conditions may also need to wear the mask at other times when they are awake. Follow  instructions from your health care provider about when to use the machine. What are some tips for using the mask?  Because the mask needs to be snug, some people feel trapped or closed-in (claustrophobic) when first using the mask. If you feel this way, you may need to get used to the mask. One way to do this is by holding the mask loosely over your nose or mouth and then gradually applying the mask more snugly. You can also gradually increase the amount of time that you use the mask.  Masks are available in various types and sizes. Some fit over your mouth and nose while others fit over just your nose. If your mask does not fit well, talk with your health care provider about getting a different one.  If you are using a mask that fits over your nose and you tend to breathe through your mouth, a chin strap may be applied to help keep your mouth closed.  The CPAP and BiPAP machines have alarms that may sound if the mask comes off or develops a leak.  If you have trouble with the mask, it is very important that you talk with your health care provider about finding a way to make the mask easier to tolerate. Do not stop using the mask. Stopping the use of the mask could have a negative impact on your health. What are some tips for using the machine?  Place your CPAP or BiPAP machine on a secure table or stand near an electrical outlet.  Know where the on/off  switch is located on the machine.  Follow instructions from your health care provider about how to set the pressure on your machine and when you should use it.  Do not eat or drink while the CPAP or BiPAP machine is on. Food or fluids could get pushed into your lungs by the pressure of the CPAP or BiPAP.  Do not smoke. Tobacco smoke residue can damage the machine.  For home use, CPAP and BiPAP machines can be rented or purchased through home health care companies. Many different brands of machines are available. Renting a machine before  purchasing may help you find out which particular machine works well for you.  Keep the CPAP or BiPAP machine and attachments clean. Ask your health care provider for specific instructions. Get help right away if:  You have redness or open areas around your nose or mouth where the mask fits.  You have trouble using the CPAP or BiPAP machine.  You cannot tolerate wearing the CPAP or BiPAP mask.  You have pain, discomfort, and bloating in your abdomen. Summary  CPAP and BiPAP are methods of helping a person breathe with the use of air pressure.  Both CPAP and BiPAP are provided by a small machine with a flexible plastic tube that attaches to a plastic mask.  If you have trouble with the mask, it is very important that you talk with your health care provider about finding a way to make the mask easier to tolerate. This information is not intended to replace advice given to you by your health care provider. Make sure you discuss any questions you have with your health care provider. Document Released: 02/02/2004 Document Revised: 03/25/2016 Document Reviewed: 03/25/2016 Elsevier Interactive Patient Education  2017 [employer] Chronic Obstructive Pulmonary Disease Chronic obstructive pulmonary disease (COPD) is a long-term (chronic) lung problem. When you have COPD, it is hard for air to get in and out of your lungs. The way your lungs work will never return to normal. Usually the condition gets worse over time. There are things you can do to keep yourself as healthy as possible. Your doctor may treat your condition with:  Medicines.  Quitting smoking, if you smoke.  Rehabilitation. This may involve a team of specialists.  Oxygen.  Exercise and changes to your diet.  Lung surgery.  Comfort measures (palliative care).  Follow these instructions at home: Medicines  Take over-the-counter and prescription medicines only as told by your doctor.  Talk to your doctor before taking  any cough or allergy medicines. You may need to avoid medicines that cause your lungs to be dry. Lifestyle  If you smoke, stop. Smoking makes the problem worse. If you need help quitting, ask your doctor.  Avoid being around things that make your breathing worse. This may include smoke, chemicals, and fumes.  Stay active, but remember to also rest.  Learn and use tips on how to relax.  Make sure you get enough sleep. Most adults need at least 7 hours a night.  Eat healthy foods. Eat smaller meals more often. Rest before meals. Controlled breathing  Learn and use tips on how to control your breathing as told by your doctor. Try: ? Breathing in (inhaling) through your nose for 1 second. Then, pucker your lips and breath out (exhale) through your lips for 2 seconds. ? Putting one hand on your belly (abdomen). Breathe in slowly through your nose for 1 second. Your hand on your belly should move out. Pucker your lips  and breathe out slowly through your lips. Your hand on your belly should move in as you breathe out. Controlled coughing  Learn and use controlled coughing to clear mucus from your lungs. The steps are: 1. Lean your head a little forward. 2. Breathe in deeply. 3. Try to hold your breath for 3 seconds. 4. Keep your mouth slightly open while coughing 2 times. 5. Spit any mucus out into a tissue. 6. Rest and do the steps again 1 or 2 times as needed. General instructions  Make sure you get all the shots (vaccines) that your doctor recommends. Ask your doctor about a flu shot and a pneumonia shot.  Use oxygen therapy and therapy to help improve your lungs (pulmonary rehabilitation) if told by your doctor. If you need home oxygen therapy, ask your doctor if you should buy a tool to measure your oxygen level (oximeter).  Make a COPD action plan with your doctor. This helps you know what to do if you feel worse than usual.  Manage any other conditions you have as told by your  doctor.  Avoid going outside when it is very hot, cold, or humid.  Avoid people who have a sickness you can catch (contagious).  Keep all follow-up visits as told by your doctor. This is important. Contact a doctor if:  You cough up more mucus than usual.  There is a change in the color or thickness of the mucus.  It is harder to breathe than usual.  Your breathing is faster than usual.  You have trouble sleeping.  You need to use your medicines more often than usual.  You have trouble doing your normal activities such as getting dressed or walking around the house. Get help right away if:  You have shortness of breath while resting.  You have shortness of breath that stops you from: ? Being able to talk. ? Doing normal activities.  Your chest hurts for longer than 5 minutes.  Your skin color is more blue than usual.  Your pulse oximeter shows that you have low oxygen for longer than 5 minutes.  You have a fever.  You feel too tired to breathe normally. Summary  Chronic obstructive pulmonary disease (COPD) is a long-term lung problem.  The way your lungs work will never return to normal. Usually the condition gets worse over time. There are things you can do to keep yourself as healthy as possible.  Take over-the-counter and prescription medicines only as told by your doctor.  ATTENTION :           If you smoke, stop. Smoking makes the problem worse. This information is not intended to replace advice given to you by your health care provider. Make sure you discuss any questions you have with your health care provider. Document Released: 10/23/2007 Document Revised: 10/12/2015 Document Reviewed: 12/31/2012 Elsevier Interactive Patient Education  2017 [employer].

## 2017-12-02 NOTE — Progress Notes (Signed)
SLEEP MEDICINE CLINIC   Provider:  Larey Seat, M D  Primary Care Physician:  [name], MD   Referring Provider: [name], MD    Chief Complaint  Patient presents with  . New Patient (Initial Visit)    pt alone, rm 10. pt states that he was on vacation and had a episode where it caused him to get a pacemaker placed. pt states that he has difficulty getting a full nights sleep wakes up every 1-2 hrs.     HPI:  [name] is a 67 y.o. male , seen here as in a referral from Dr. Nadara Mustard for a sleep apnea evaluation.   [name] is a Caucasian right-handed gentleman, referred today by Dr. [name] his primary care physician.  The patient has been a longtime snorer, expects expressed daytime fatigue and has multiple medical problems. [name] visited a cousin in [place] when he suffered a syncope spell related to bradycardia, his heart rate may have been as low as 30 bpm.The patient received a pacemaker which is now followed by Dr. Rayann Heman at [facility] heart specialist.  Dr. Lyman Speller note indicate that as the patient stayed at [facility] where he received his pacemaker apnea was observed.  [name] is an active smoker, he has coronary artery disease and received a cardiac stent 20 years almost 20 years ago. He also has chronic kidney disease stage III-stage IV with a creatinine clearance that has been decreasing over the last 5 years.  Chief complaint according to patient :  Patient's wife has witnessed him snoring mildly when he falls asleep in a recliner, when in bed he sleeps prone and there has been no snoring observed in that position.  His wife also has not mentioned that he has apnea.  Apnea was apparently witnessed during his hospitalization.  I reviewed the current medication list on 19 July of this year he was last seen by Dr. Nadara Mustard prior to this referral and he presented with a blood pressure in a seated position 8 162/78 mmHg.  His  body mass index was 30.4, his respiratory rate 24 bpm heart rate 64 and regular.  The patient was diagnosed with a pulmonary nodule by a chest CT in December 2018 which has not been followed up yet.  Dr. [name] had applied a heart monitor, found the reason for palpitations in bradycardia and  a-flutters, Dr. Oletta Paul is his GI doctor, and his nephrologist is Dr. Lowanda Foster,  who also added iron for chronic kidney disease related anemia.  The patient also has diabetes and is followed regularly by ophthalmology.  Sleep habits are as follows: orthopnea, seated position helps the patient to have the most restful sleep, he does not have a rigid bedtime, but usually retreats to the bedroom between 1030 and 11:30 PM.  It is not difficult for him to fall asleep to stay asleep if he sleeps in bed he likes to sleep prone but his breathing is easier if he is in a seated position or reclined position. The bedroom is cool, quiet and dark. Shared with wife.  He wakes up about every 90 minutes or so, some of these breaks are nocturia as others are rather spontaneous. He usually sleeps best between 4 and 8 AM. The most refreshing sleep is late sleep. He dreams during that time. Vivid , lucid dreams- occassionally kicking or jerking. He sleep talks.   He rises between 730 and 8:30 AM, averaging 5 hours of  nocturnal sleep. He inadvertently naps in daytime, when at rest, not active, not stimulated. Catnaps of 30-45 minutes.   Sleep medical history and family sleep history:  No other family member with sleep apnea. 4 sisters and 2 brothers, he is the oldest son. 2 sisters have passed - one of SIDS, one sister of substance abuse. Brother had back problems.   Social history:  Married, children are adult and from first marriage.  Police office, retired-  Smoking since age 32, 1 PPD. 52 pack years.  ETOH - very rarely, caffeine - diet sodas, mountain dew, not cola.   Review of Systems: Out of a complete 14 system review, the  patient complains of only the following symptoms, and all other reviewed systems are negative.  RLS-  Irresistible urge to move . May be anemia related or PVD.  Hypoxemia, COPD, bronchitis.  Epworth score - with naps- 8/ 24 but describing the irresistible urge to sleep.  , Fatigue severity score 33 points   , depression score N/a    Social History   Socioeconomic History  . Marital status: Married    Spouse name: Not on file  . Number of children: Not on file  . Years of education: Not on file  . Highest education level: Not on file  Occupational History  . Not on file  Social Needs  . Financial resource strain: Not on file  . Food insecurity:    Worry: Not on file    Inability: Not on file  . Transportation needs:    Medical: Not on file    Non-medical: Not on file  Tobacco Use  . Smoking status: Current Every Day Smoker    Packs/day: 1.00    Years: 40.00    Pack years: 40.00    Types: Cigarettes    Start date: 05/20/1968  . Smokeless tobacco: Never Used  Substance and Sexual Activity  . Alcohol use: No    Alcohol/week: 0.0 oz  . Drug use: No  . Sexual activity: Yes  Lifestyle  . Physical activity:    Days per week: Not on file    Minutes per session: Not on file  . Stress: Not on file  Relationships  . Social connections:    Talks on phone: Not on file    Gets together: Not on file    Attends religious service: Not on file    Active member of club or organization: Not on file    Attends meetings of clubs or organizations: Not on file    Relationship status: Not on file  . Intimate partner violence:    Fear of current or ex partner: Not on file    Emotionally abused: Not on file    Physically abused: Not on file    Forced sexual activity: Not on file  Other Topics Concern  . Not on file  Social History Narrative   Patient continues to smoke.     Family History  Problem Relation Age of Onset  . Diabetes Mother   . Alcohol abuse Father     Past Medical  History:  Diagnosis Date  . Asthma   . Atrial flutter ([place])   . CAD (coronary artery disease)    a. s/p prior stenting of RCA in 1999 b. cath in 2003 showing moderate CAD c. NST in 2016 showing small area of ischemic and low-risk  . Chronic renal insufficiency   . COPD (chronic obstructive pulmonary disease) ([place])    with ongoing tobacco  use and patient failed sham takes  . DM2 (diabetes mellitus, type 2) ([place])   . Dyslipidemia   . Edema    chronic lower extremity secondary to right heart failure and chronic venous insufficiency  . Headache   . HTN (hypertension)   . Morbid obesity ([place])   . Pneumonia   . Presence of permanent cardiac pacemaker   . 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   Marland Kitchen Reflux esophagitis    hx  . Renal insufficiency   . Sleep apnea   . Two-vessel coronary artery disease    moderate. by cath in 2003. Status post stenting of the mdi RCA November 1999 normal left ventricular ejection fraction    Past Surgical History:  Procedure Laterality Date  . ABDOMINAL SURGERY    . APPENDECTOMY    . BACK SURGERY    . CARDIAC CATHETERIZATION     stent  . CHOLECYSTECTOMY    . CLOSED REDUCTION SHOULDER DISLOCATION    . diabetic ulcers    . GROIN EXPLORATION    . OSTECTOMY Left 04/23/2017   Procedure: OSTECTOMY LEFT GREAT TOE;  Surgeon: [name], DPM;  Location: [facility];  Service: Podiatry;  Laterality: Left;  left great toe  . TUMOR REMOVAL     small intestine     Current Outpatient Medications  Medication Sig Dispense Refill  . acetaminophen (TYLENOL) 325 MG tablet Take 975 mg daily by mouth. 3 tablets every morning    . albuterol (VENTOLIN HFA) 108 (90 BASE) MCG/ACT inhaler Inhale 2 puffs into the lungs every 6 (six) hours as needed for wheezing or shortness of breath.     . Cholecalciferol (VITAMIN D3) 2000 units TABS Take 1 tablet by mouth daily.    Marland Kitchen diltiazem (CARDIZEM) 30 MG tablet Take 1  tablet (30 mg total) by mouth 2 (two) times daily as needed (As needed for palpitations.). 30 tablet 6  . ELIQUIS 5 MG TABS tablet TAKE ONE TABLET BY MOUTH TWICE DAILY 60 tablet 3  . hydrALAZINE (APRESOLINE) 100 MG tablet TAKE ONE TABLET BY MOUTH THREE TIMES DAILY 90 tablet 3  . insulin glargine (LANTUS) 100 UNIT/ML injection Inject 35 Units daily after breakfast into the skin.     Marland Kitchen LINZESS 290 MCG CAPS capsule Take 290 mcg by mouth daily as needed (for constipation).   6  . Melatonin 10 MG TABS Take 30 mg by mouth at bedtime.    . Multiple Vitamin (MULTIVITAMIN) tablet Take 1 tablet by mouth daily.      Marland Kitchen omeprazole (PRILOSEC) 20 MG capsule Take 20 mg daily by mouth.    . polyethylene glycol (MIRALAX / GLYCOLAX) packet Take 17 g by mouth every morning.    . potassium chloride SA (K-DUR,KLOR-CON) 20 MEQ tablet Take 1 tablet (20 mEq total) by mouth daily. 30 tablet 0  . QVAR REDIHALER 80 MCG/ACT inhaler Inhale 2 puffs into the lungs 2 (two) times daily.  12  . rosuvastatin (CRESTOR) 20 MG tablet TAKE ONE TABLET BY MOUTH EVERY DAY 30 tablet 3  . silver sulfADIAZINE (SILVADENE) 1 % cream Apply 1 application daily topically. For wound care with dressing    . torsemide (DEMADEX) 100 MG tablet Take 50 mg by mouth 2 (two) times daily. Changed by Dr. Lowanda Foster this March.  4  . carvedilol (COREG) 25 MG tablet Take 1 tablet (25 mg total) by mouth 2 (two) times daily. 180 tablet 3  No current facility-administered medications for this visit.     Allergies as of 12/02/2017  . (No Known Allergies)    Vitals: BP (!) 172/88   Pulse (!) 59   Ht 6\' 4"  (1.93 m)   Wt 240 lb (108.9 kg)   BMI 29.21 kg/m  Last Weight:  Wt Readings from Last 1 Encounters:  12/02/17 240 lb (108.9 kg)   TDH:RCBU mass index is 29.21 kg/m.     Last Height:   Ht Readings from Last 1 Encounters:  12/02/17 6\' 4"  (1.93 m)    Physical exam:  General: The patient is awake, alert and appears not in acute distress. The  patient appears older than his numeric age. Head: Normocephalic, atraumatic. Neck is supple. Mallampati 3,  neck circumference:16.5 ". Nasal airflow patent  Retrognathia is not seen.  Cardiovascular:  Regular rate and rhythm, without murmurs or carotid bruit, and without distended neck veins. Respiratory: Lungs wheezing to auscultation. Skin:  Without evidence of edema, or rash.  Trunk: BMI is 29. The patient's posture is erect.   Neurologic exam : The patient is awake and alert, oriented to place and time. Attention span & concentration ability appears normal.  Speech is fluent, without dysarthria, dysphonia or aphasia.  Mood and affect are appropriate.  Cranial nerves: Pupils are equal and briskly reactive to light.  Funduscopic exam without evidence of pallor or edema.  Extraocular movements  in vertical and horizontal planes intact and without nystagmus. Visual fields by finger perimetry are intact. Hearing to finger rub intact.  Facial sensation intact to fine touch. Facial motor strength is symmetric and tongue and uvula move midline. Shoulder shrug was symmetrical.   Motor exam: Normal tone, muscle bulk and symmetric strength in all extremities. Sensory:  Fine touch, pinprick and vibration were tested in all extremities. Proprioception tested in the upper extremities was normal. Coordination:  Finger-to-nose maneuver  normal without evidence of ataxia, dysmetria or tremor. Gait and station: Patient walks without assistive device and is able unassisted to climb up to the exam table. Deep tendon reflexes: in the upper and lower extremities are symmetric and intact.     67 year old male with a history of COPD, tobacco abuse, CKD stage IV, diabetes mellitus, coronary artery disease, Buerger's disease, atrial flutter on apixaban, and diastolic CHF presenting with 3-4-day history of chest discomfort and intermittent shortness of breath.  He states that it has been happening more  frequently when he has been feeling a fluttering sensation is in his chest as well as his chest discomfort.  This usually happens when he is at rest; he denies any exertional chest discomfort.  He developed chest discomfort shortness of breath at 8:30 in the morning on May 09, 2017 he actually went to his podiatry appointment on May 09, 2017 after which he went to the emergency department.  The patient took some nitroglycerin which helped his chest discomfort.  Initial troponin was 0.11 which increased to 0.45-->0.44.  The patient had intermittent chest discomfort overnight on 05/09/2017 to 05/10/17.  Blood pressure in the emergency department was up to 163/103 the patient states that he has been poorly compliant with.  His cardiac medications and insulin up until this past week after he saw Dr. [name] in the office on May 02, 2017.   Assessment:  After physical and neurologic examination, review of laboratory studies,  Personal review of imaging studies, reports of other /same  Imaging studies, results of polysomnography and / or neurophysiology testing and pre-existing  records as far as provided in visit., my assessment is :fragmented sleep.   1) COPD overlap with orthopnea- '' witnessed apnea during hospital stay, cardiologist supported OSA work up.   ) Nocturia times 3, CKD grade 3-4, muscle cramp.    ) High Blood pressure- paced rhythm.  This and DM are main reasons for renal disease. anemis is a result of chronic kidney disease, but RLS have been present for many decades.   ) COPD with wheezing and frequent bronchitis. Still actively smoking.    The patient was advised of the nature of the diagnosed disorder , the treatment options and the  risks for general health and wellness arising from not treating the condition. I will order a SPLIT night PSG - with CO2 needed. this patient has COPD.   I spent more than 60 minutes of face to face time with the patient.  Greater than 50%  of time was spent in counseling and coordination of care. We have discussed the diagnosis and differential and I answered the patient's questions.    Plan:  Treatment plan and additional workup :  [name] reports that nicotine patches or nicotine chewing gum have not helped him to spend the night without smoking breaks.  This is 1 of his main concerns in regards to an attended sleep lab based sleep study.  He needs to have an attended study in a patient with his comorbidities, also in case the need for oxygen at night has to be documented.  I need also to obtain capnography data.    I would allow the patient 1 smoking break between the diagnostic part and the hopefully split-night polysomnography part. He has failed Chantix and Wellbutrin in the past.  He even tried hypnosis. I will make the sleep tech aware that this patient may need to be unhooked to go to smoke. He will request a mountain dew at the bedside. RV after test.  .    Larey Seat, MD 09/03/3843, 36:46 PM  Certified in Neurology by ABPN Certified in [place] by [facility] [address], [place], [place] [address]

## 2017-12-15 DIAGNOSIS — L97522 Non-pressure chronic ulcer of other part of left foot with fat layer exposed: Secondary | ICD-10-CM | POA: Diagnosis not present

## 2017-12-15 DIAGNOSIS — E1142 Type 2 diabetes mellitus with diabetic polyneuropathy: Secondary | ICD-10-CM | POA: Diagnosis not present

## 2017-12-17 ENCOUNTER — Ambulatory Visit (INDEPENDENT_AMBULATORY_CARE_PROVIDER_SITE_OTHER): Payer: Medicare Other | Admitting: Neurology

## 2017-12-17 DIAGNOSIS — R0601 Orthopnea: Secondary | ICD-10-CM

## 2017-12-17 DIAGNOSIS — G4701 Insomnia due to medical condition: Secondary | ICD-10-CM

## 2017-12-17 DIAGNOSIS — R062 Wheezing: Secondary | ICD-10-CM

## 2017-12-17 DIAGNOSIS — I2584 Coronary atherosclerosis due to calcified coronary lesion: Secondary | ICD-10-CM

## 2017-12-17 DIAGNOSIS — G4733 Obstructive sleep apnea (adult) (pediatric): Secondary | ICD-10-CM | POA: Diagnosis not present

## 2017-12-17 DIAGNOSIS — Z950 Presence of cardiac pacemaker: Secondary | ICD-10-CM

## 2017-12-17 DIAGNOSIS — R001 Bradycardia, unspecified: Secondary | ICD-10-CM

## 2017-12-17 DIAGNOSIS — I2510 Atherosclerotic heart disease of native coronary artery without angina pectoris: Secondary | ICD-10-CM

## 2017-12-17 DIAGNOSIS — J449 Chronic obstructive pulmonary disease, unspecified: Secondary | ICD-10-CM

## 2017-12-22 NOTE — Procedures (Signed)
PATIENT'S NAME:  [name] DOB:      1950/09/24      MR#:    [id number]     DATE OF RECORDING: 12/17/2017 REFERRING M.D.:  Albert Percy, MD Study Performed:   Baseline Polysomnogram HISTORY:  [name] is a Caucasian right-handed gentleman, referred by Dr. Rory Paul, his primary care physician. The patient has been a longtime snorer, expects expressed daytime fatigue and has multiple medical problems. [name] visited [place], [place] when he suffered a syncope spell related to bradycardia, his heart rate may have been as low as 30 bpm. The patient there received a pacemaker which is now followed by Dr. Rayann Paul.  Dr. Lyman Paul note indicate that as the patient stayed at [facility] where he received his pacemaker apnea was observed. [name] is an active smoker, he has coronary artery disease and received a cardiac stent 20 years almost 20 years ago. He also has chronic kidney disease stage III-stage IV with a creatinine clearance that has been continuously decreasing over the last 5 years. Chief complaint according to patient : Patient's wife has witnessed him snoring mildly when he falls asleep in a recliner, when in bed he sleeps prone and there has been no snoring observed in that position.  Apnea was apparently witnessed during his hospitalization, he reports orthopnea, seated position helps the patient to have the most restful sleep, but his breathing is easier. He dreams vivid, lucid dreams- occasionally kicking or jerking and he sleep talks. Gets 5 hours of nocturnal sleep. He inadvertently naps in daytime, when at rest, not active, not stimulated and catnaps for 30-45 minutes.   Asthma, Atrial flutter/ CAD, CKD 3, COPD, Type 2 diabetes, Dyslipidemia, Edema, HTN, Morbid obesity, Cardiac pacemaker, Peripheral vascular disease, GERD. The patient endorsed the Epworth Sleepiness Scale at 8/24 points (with naps), the FSS at 33 points.   The patient's weight 240 pounds with  a height of 76 (inches), resulting in a BMI of 29.3 kg/m2. The patient's neck circumference measured 16.5 inches.  CURRENT MEDICATIONS: Tylenol, Ventolin, Vitamin D3, Cardizem, Eliquis, Apresoline, Lantus, Linzess, Melatonin, Multivitamin, Prilosec, Miralax, Klor-con, Avar inhaler, Crestor, Silvadene, Demadex, Coreg.   PROCEDURE:  This is a multichannel digital polysomnogram utilizing the Somnostar 11.2 system.  Electrodes and sensors were applied and monitored per AASM Specifications.   EEG, EOG, Chin and Limb EMG, were sampled at 200 Hz.  ECG, Snore and Nasal Pressure, Thermal Airflow, Respiratory Effort, CPAP Flow and Pressure, Oximetry was sampled at 50 Hz. Digital video and audio were recorded.      BASELINE STUDY : Lights Out was at 22:34 and Lights On at 04:49.  Total recording time (TRT) was 375.5 minutes, with a total sleep time (TST) of 312.5 minutes.   The patient's sleep latency was 40.5 minutes.  REM latency was 94 minutes.  The sleep efficiency was 83.2 %.     SLEEP ARCHITECTURE: WASO (Wake after sleep onset) was 29 minutes.  There were 9.5 minutes in Stage N1, 222.5 minutes Stage N2, 0 minutes Stage N3 and 80.5 minutes in Stage REM.  The percentage of Stage N1 was 3.%, Stage N2 was 71.2%, Stage N3 was 0% and Stage R (REM sleep) was 25.8%.    RESPIRATORY ANALYSIS:  There were a total of 22 respiratory events:  3 obstructive apneas, 1 central apneas and 0 mixed apneas with a total of 4 apneas and an apnea index (AI) of .8 /hour. There were 18 hypopneas with a hypopnea  index of 3.5 /hour. The patient also had 0 respiratory event related arousals (RERAs).The total APNEA/HYPOPNEA INDEX (AHI) was 4.2/hour and the total RESPIRATORY DISTURBANCE INDEX was 4.2 /hour.  10 events occurred in REM sleep and 17 events in NREM. The REM AHI was 7.5 /hour, versus a non-REM AHI of 3.1/h. The patient spent 22 minutes of total sleep time in the supine position and 291 minutes in non-supine. The supine AHI was  10.9/h. versus a non-supine AHI of 3.7.  OXYGEN SATURATION & C02:  The Wake baseline 02 saturation was 96%, with the lowest being 70%. Time spent below 89% saturation equaled 57 minutes. Average End Tidal CO2 during sleep 33 torr, the peak End Tidal CO2 during REM sleep was 37 torr, during NREM sleep was 36 torr.  Total sleep time greater than 40 torr was 0.0 minutes.   PERIODIC LIMB MOVEMENTS:  The patient had a total of 196 Periodic Limb Movements.  The Periodic Limb Movement (PLM) index was 37.6 and the PLM Arousal index was 9.0/hour. The arousals were noted as: 57 were spontaneous, 47 were associated with PLMs, and 11 were associated with respiratory events. PLMs were most frequent at the beginning of the night and less in AM.   Audio and video analysis did not show any abnormal or unusual movements, behaviors, phonations or vocalizations.  Snoring was not noted. Pacemaker-EKG. Nocturia times 1.  Post-study, the patient indicated that sleep was the same as usual.   IMPRESSION:  1. Very mild Obstructive Sleep Apnea (OSA), not significant enough to initiate CPAP at AHI less than 5/h. 2. Sleep hypoxemia (over 50 minutes total) without hypercapnia.   3. Severe Periodic Limb Movement Disorder (PLMD) - related to PVD/ AVD.  4. No Snoring but no supine sleep was noted which may have brought it on ( patient refused)  5. Pacemaker EKG.  RECOMMENDATIONS: No snoring, no significant apnea found.  1) Hypoxemia- this patient needs to quit smoking ASAP.  2) PLMs were frequent and likely leg pain related - the patient has a known condition of peripheral artery disease. This also is a smoking related illness. Some PLMs were noted during REM sleep - indicate that the patient is prone to dream enactment also none took place during this study.   3) May need oxygen to use overnight, refer to pulmonology for related testing. Our sleep lab can only titrate to oxygen if CPAP has been implemented and failed to  improve 02 saturation alone.   I certify that I have reviewed the entire raw data recording prior to the issuance of this report in accordance with the Standards of Accreditation of the [place] [employer] (AASM)

## 2017-12-23 ENCOUNTER — Telehealth: Payer: Self-pay | Admitting: Neurology

## 2017-12-23 DIAGNOSIS — I2510 Atherosclerotic heart disease of native coronary artery without angina pectoris: Secondary | ICD-10-CM

## 2017-12-23 DIAGNOSIS — J449 Chronic obstructive pulmonary disease, unspecified: Secondary | ICD-10-CM

## 2017-12-23 DIAGNOSIS — G4733 Obstructive sleep apnea (adult) (pediatric): Secondary | ICD-10-CM

## 2017-12-23 DIAGNOSIS — I2584 Coronary atherosclerosis due to calcified coronary lesion: Secondary | ICD-10-CM

## 2017-12-23 DIAGNOSIS — R0683 Snoring: Secondary | ICD-10-CM

## 2017-12-23 DIAGNOSIS — R0902 Hypoxemia: Secondary | ICD-10-CM

## 2017-12-23 NOTE — Telephone Encounter (Signed)
Called the patient and reviewed his sleep study with him. Informed him that Dr Brett Fairy reviewed the study and found there was very mild apnea, not enough that would qualify for a CPAP for treatment. The patient did have issues with his oxygen levels dropping. With this concern she would like to encourage the patient to quit smoking and also have the patient follow up with a pulmonologist to help with further evaluating and treating the pt for the hypoxemia. I will place a order for pulmonology referral per Dr Dohmeier's recommendation since the patient agrees for this. Pt verbalized understanding. Pt had no questions at this time but was encouraged to call back if questions arise.

## 2017-12-23 NOTE — Telephone Encounter (Signed)
-----   Message from Larey Seat, MD sent at 12/22/2017  5:55 PM EDT ----- IMPRESSION:  1. Very mild Obstructive Sleep Apnea (OSA), not significant  enough to initiate CPAP at AHI less than 5/h. 2. Sleep hypoxemia (over 50 minutes total) without hypercapnia.  3. Severe Periodic Limb Movement Disorder (PLMD) - related to  PVD/ AVD.  4. No Snoring but no supine sleep was noted which may have  brought it on ( patient refused)  5. Pacemaker EKG.  RECOMMENDATIONS: No snoring, no significant apnea found.  1) Hypoxemia- this patient needs to quit smoking ASAP.  2) PLMs were frequent and likely leg pain related - the patient  has a known condition of peripheral artery disease. This also is  a smoking related illness. Some PLMs were noted during REM sleep  - indicate that the patient is prone to dream enactment also none  took place during this study.  3) May need oxygen to use overnight, refer to pulmonology for  related testing. Our sleep lab can only titrate to oxygen if CPAP  has been implemented and failed to improve 02 saturation alone.

## 2017-12-29 DIAGNOSIS — L97522 Non-pressure chronic ulcer of other part of left foot with fat layer exposed: Secondary | ICD-10-CM | POA: Diagnosis not present

## 2017-12-29 DIAGNOSIS — E1142 Type 2 diabetes mellitus with diabetic polyneuropathy: Secondary | ICD-10-CM | POA: Diagnosis not present

## 2018-01-05 DIAGNOSIS — Z79899 Other long term (current) drug therapy: Secondary | ICD-10-CM | POA: Diagnosis not present

## 2018-01-05 DIAGNOSIS — I10 Essential (primary) hypertension: Secondary | ICD-10-CM | POA: Diagnosis not present

## 2018-01-05 DIAGNOSIS — E559 Vitamin D deficiency, unspecified: Secondary | ICD-10-CM | POA: Diagnosis not present

## 2018-01-05 DIAGNOSIS — R809 Proteinuria, unspecified: Secondary | ICD-10-CM | POA: Diagnosis not present

## 2018-01-05 DIAGNOSIS — N184 Chronic kidney disease, stage 4 (severe): Secondary | ICD-10-CM | POA: Diagnosis not present

## 2018-01-05 DIAGNOSIS — N183 Chronic kidney disease, stage 3 (moderate): Secondary | ICD-10-CM | POA: Diagnosis not present

## 2018-01-07 ENCOUNTER — Institutional Professional Consult (permissible substitution): Payer: Self-pay | Admitting: Neurology

## 2018-01-07 ENCOUNTER — Encounter

## 2018-01-07 DIAGNOSIS — D509 Iron deficiency anemia, unspecified: Secondary | ICD-10-CM | POA: Diagnosis not present

## 2018-01-07 DIAGNOSIS — N184 Chronic kidney disease, stage 4 (severe): Secondary | ICD-10-CM | POA: Diagnosis not present

## 2018-01-07 DIAGNOSIS — N2581 Secondary hyperparathyroidism of renal origin: Secondary | ICD-10-CM | POA: Diagnosis not present

## 2018-01-07 DIAGNOSIS — R809 Proteinuria, unspecified: Secondary | ICD-10-CM | POA: Diagnosis not present

## 2018-01-21 ENCOUNTER — Encounter: Payer: Self-pay | Admitting: Pulmonary Disease

## 2018-01-21 ENCOUNTER — Ambulatory Visit (INDEPENDENT_AMBULATORY_CARE_PROVIDER_SITE_OTHER): Payer: Medicare Other | Admitting: Pulmonary Disease

## 2018-01-21 VITALS — BP 128/70 | HR 60 | Ht 76.0 in | Wt 242.8 lb

## 2018-01-21 DIAGNOSIS — R0902 Hypoxemia: Secondary | ICD-10-CM | POA: Diagnosis not present

## 2018-01-21 NOTE — Progress Notes (Signed)
I appreciate the evaluation,  the assessment and plan as directed by the colleague in pulmonology , Dr. Ander Slade .The patient is known to me .   Albert Pita, MD  [facility]

## 2018-01-21 NOTE — Patient Instructions (Signed)
Nocturnal desaturations on recent polysomnogram  Oxygen supplementation at night at 3 L  Overnight oximetry with supplementation  Repeat PFT to compare the with the one performed in 2015  I will see you back in the office in about 3 months  Smoking cessation counseling

## 2018-01-21 NOTE — Progress Notes (Signed)
[name]    [id number]    [birth year]  Primary Care Physician:Howard, Lennette Bihari, MD  Referring Physician: Darleen Crocker, RN No address on file  Chief complaint:   Patient was referred secondary to hypoxemia on recent polysomnogram  HPI: Patient had a sleep study 12/17/2017-significant for hypoxemia, very mild obstructive sleep apnea that does not meet threshold for treatment He is an active smoker Has a history of obstructive lung disease, chronic kidney disease for which fistula is been planned Diabetes  Denies shortness of breath with regular activity Denies a chronic cough No chest pains or chest discomfort  Occupation: Was a Engineer, structural Exposures: No significant exposures Smoking history: Active smoker-pack a day  Outpatient Encounter Medications as of 01/21/2018  Medication Sig  . acetaminophen (TYLENOL) 325 MG tablet Take 975 mg daily by mouth. 3 tablets every morning  . albuterol (VENTOLIN HFA) 108 (90 BASE) MCG/ACT inhaler Inhale 2 puffs into the lungs every 6 (six) hours as needed for wheezing or shortness of breath.   . ALPRAZolam (XANAX) 0.5 MG tablet Take 1 tablet (0.5 mg total) by mouth at bedtime as needed for anxiety.  . Cholecalciferol (VITAMIN D3) 2000 units TABS Take 1 tablet by mouth daily.  Marland Kitchen diltiazem (CARDIZEM) 30 MG tablet Take 1 tablet (30 mg total) by mouth 2 (two) times daily as needed (As needed for palpitations.).  Marland Kitchen ELIQUIS 5 MG TABS tablet TAKE ONE TABLET BY MOUTH TWICE DAILY  . hydrALAZINE (APRESOLINE) 100 MG tablet TAKE ONE TABLET BY MOUTH THREE TIMES DAILY  . insulin glargine (LANTUS) 100 UNIT/ML injection Inject 35 Units daily after breakfast into the skin.   Marland Kitchen LINZESS 290 MCG CAPS capsule Take 290 mcg by mouth daily as needed (for constipation).   . Melatonin 10 MG TABS Take 30 mg by mouth at bedtime.  . Multiple Vitamin (MULTIVITAMIN) tablet Take 1 tablet by mouth daily.    Marland Kitchen omeprazole (PRILOSEC) 20 MG capsule Take 20 mg  daily by mouth.  . polyethylene glycol (MIRALAX / GLYCOLAX) packet Take 17 g by mouth every morning.  . potassium chloride SA (K-DUR,KLOR-CON) 20 MEQ tablet Take 1 tablet (20 mEq total) by mouth daily.  Marland Kitchen QVAR REDIHALER 80 MCG/ACT inhaler Inhale 2 puffs into the lungs 2 (two) times daily.  . rosuvastatin (CRESTOR) 20 MG tablet TAKE ONE TABLET BY MOUTH EVERY DAY  . silver sulfADIAZINE (SILVADENE) 1 % cream Apply 1 application daily topically. For wound care with dressing  . torsemide (DEMADEX) 100 MG tablet Take 50 mg by mouth 2 (two) times daily. Changed by Dr. Lowanda Foster this March.  . carvedilol (COREG) 25 MG tablet Take 1 tablet (25 mg total) by mouth 2 (two) times daily.   No facility-administered encounter medications on file as of 01/21/2018.     Allergies as of 01/21/2018  . (No Known Allergies)    Past Medical History:  Diagnosis Date  . Asthma   . Atrial flutter ([place])   . CAD (coronary artery disease)    a. s/p prior stenting of RCA in 1999 b. cath in 2003 showing moderate CAD c. NST in 2016 showing small area of ischemic and low-risk  . Chronic renal insufficiency   . COPD (chronic obstructive pulmonary disease) ([place])    with ongoing tobacco use and patient failed sham takes  . DM2 (diabetes mellitus, type 2) ([place])   . Dyslipidemia   . Edema    chronic lower extremity secondary to  right heart failure and chronic venous insufficiency  . Headache   . HTN (hypertension)   . Morbid obesity ([place])   . Pneumonia   . Presence of permanent cardiac pacemaker   . 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   Marland Kitchen Reflux esophagitis    hx  . Renal insufficiency   . Sleep apnea   . Two-vessel coronary artery disease    moderate. by cath in 2003. Status post stenting of the mdi RCA November 1999 normal left ventricular ejection fraction    Past Surgical History:  Procedure Laterality Date  . ABDOMINAL SURGERY    . APPENDECTOMY      . BACK SURGERY    . CARDIAC CATHETERIZATION     stent  . CHOLECYSTECTOMY    . CLOSED REDUCTION SHOULDER DISLOCATION    . diabetic ulcers    . GROIN EXPLORATION    . OSTECTOMY Left 04/23/2017   Procedure: OSTECTOMY LEFT GREAT TOE;  Surgeon: [name], DPM;  Location: [facility];  Service: Podiatry;  Laterality: Left;  left great toe  . TUMOR REMOVAL     small intestine     Family History  Problem Relation Age of Onset  . Diabetes Mother   . Alcohol abuse Father     Social History   Socioeconomic History  . Marital status: Married    Spouse name: Not on file  . Number of children: Not on file  . Years of education: Not on file  . Highest education level: Not on file  Occupational History  . Not on file  Social Needs  . Financial resource strain: Not on file  . Food insecurity:    Worry: Not on file    Inability: Not on file  . Transportation needs:    Medical: Not on file    Non-medical: Not on file  Tobacco Use  . Smoking status: Current Every Day Smoker    Packs/day: 1.00    Years: 40.00    Pack years: 40.00    Types: Cigarettes    Start date: 05/20/1968  . Smokeless tobacco: Never Used  Substance and Sexual Activity  . Alcohol use: No    Alcohol/week: 0.0 standard drinks  . Drug use: No  . Sexual activity: Yes  Lifestyle  . Physical activity:    Days per week: Not on file    Minutes per session: Not on file  . Stress: Not on file  Relationships  . Social connections:    Talks on phone: Not on file    Gets together: Not on file    Attends religious service: Not on file    Active member of club or organization: Not on file    Attends meetings of clubs or organizations: Not on file    Relationship status: Not on file  . Intimate partner violence:    Fear of current or ex partner: Not on file    Emotionally abused: Not on file    Physically abused: Not on file    Forced sexual activity: Not on file  Other Topics Concern  . Not on file  Social  History Narrative   Patient continues to smoke.     Review of Systems  Constitutional: Negative.   HENT: Negative.   Eyes: Negative.   Respiratory: Negative.   Cardiovascular: Negative for chest pain and leg swelling.  Gastrointestinal: Negative.   Endocrine: Negative.   Genitourinary: Negative.   Allergic/Immunologic: Negative.  Vitals:   01/21/18 1136  BP: 128/70  Pulse: 60  SpO2: 96%     Physical Exam  Constitutional: He is oriented to person, place, and time. He appears well-developed and well-nourished.  HENT:  Head: Normocephalic and atraumatic.  Eyes: Pupils are equal, round, and reactive to light. Conjunctivae and EOM are normal. Right eye exhibits no discharge. Left eye exhibits no discharge.  Neck: Normal range of motion. Neck supple. No thyromegaly present.  Cardiovascular: Normal rate and regular rhythm.  Pulmonary/Chest: Effort normal and breath sounds normal. No respiratory distress. He has no wheezes.  Abdominal: Soft. Bowel sounds are normal. He exhibits no distension.  Musculoskeletal: Normal range of motion. He exhibits no edema or deformity.  Neurological: He is alert and oriented to person, place, and time. He has normal reflexes. No cranial nerve deficit.  Skin: Skin is warm and dry. No erythema.  Psychiatric: He has a normal mood and affect.   Data Reviewed: Polysomnogram reviewed  Assessment:  .  Nocturnal desaturations  .  Hypoxemia  .  Coronary artery disease  .  Chronic obstructive pulmonary disease  .  Smoker  He does have extensive comorbidities, quitting smoking is paramount to keeping his health  Plan/Recommendations:  Supplemental oxygen at 3 L  Repeat oximetry with oxygen on  Smoking cessation counseling was discussed extensively  We will repeat pulmonary function study to ascertain severity of obstructive lung disease  I will see him back in about 3 months       Sherrilyn Rist MD [facility] 01/21/2018, 3:27 PM  CC: Darleen Crocker, RN

## 2018-01-22 ENCOUNTER — Telehealth: Payer: Self-pay | Admitting: Pulmonary Disease

## 2018-01-22 ENCOUNTER — Other Ambulatory Visit: Payer: Self-pay

## 2018-01-22 DIAGNOSIS — N186 End stage renal disease: Secondary | ICD-10-CM

## 2018-01-22 NOTE — Telephone Encounter (Signed)
Called and spoke with Joellen Jersey at [facility]. She said that insurance will not cover nocturnal oxygen based off a sleep study. Since they have the order already for ONO they will get the ONO done and then send back the study results.  At that time they will need a new order for oxygen based off the ONO.  She also requested OV notes, faxed to [phone]. Those have been faxed. Nothing further needed until we receive the ONO.  Routing to [employer] and Dr. Jenetta Downer as Juluis Rainier.

## 2018-01-22 NOTE — Telephone Encounter (Signed)
Spoke with Katie at [facility], states that an order yesterday was placed for pt to have nocturnal O2 as well as an ONO.  Joellen Jersey says that she cannot provide O2 until ONO proves that pt needs supplemental O2.  A new order will need to be placed after ONO.  Also needs yesterday's OV note faxed to [phone].

## 2018-01-23 ENCOUNTER — Encounter: Payer: Self-pay | Admitting: Internal Medicine

## 2018-01-23 ENCOUNTER — Ambulatory Visit (INDEPENDENT_AMBULATORY_CARE_PROVIDER_SITE_OTHER): Payer: Medicare Other | Admitting: Internal Medicine

## 2018-01-23 DIAGNOSIS — Z950 Presence of cardiac pacemaker: Secondary | ICD-10-CM

## 2018-01-23 DIAGNOSIS — R001 Bradycardia, unspecified: Secondary | ICD-10-CM | POA: Diagnosis not present

## 2018-01-23 DIAGNOSIS — I2584 Coronary atherosclerosis due to calcified coronary lesion: Secondary | ICD-10-CM | POA: Diagnosis not present

## 2018-01-23 DIAGNOSIS — I2510 Atherosclerotic heart disease of native coronary artery without angina pectoris: Secondary | ICD-10-CM | POA: Diagnosis not present

## 2018-01-23 MED ORDER — SILDENAFIL CITRATE 100 MG PO TABS: 100.0000 mg | ORAL_TABLET | ORAL | 0 refills | Status: DC | PRN

## 2018-01-23 NOTE — Patient Instructions (Addendum)
Medication Instructions:  Viagra as needed - new prescription sent to pharmacy.   Continue all other current medications.  Labwork: none  Testing/Procedures: none  Follow-Up:  Your physician wants you to follow up in: 6 months.  You will receive a reminder letter in the mail one-two months in advance.  If you don't receive a letter, please call our office to schedule the follow up appointment - Dr. Rayann Heman.   3 months - Dr. [name].   Any Other Special Instructions Will Be Listed Below (If Applicable). Remote monitoring is used to monitor your Pacemaker of ICD from home. This monitoring reduces the number of office visits required to check your device to one time per year. It allows [place] to keep an eye on the functioning of your device to ensure it is working properly. You are scheduled for a device check from home on 02-04-2018. You may send your transmission at any time that day. If you have a wireless device, the transmission will be sent automatically. After your physician reviews your transmission, you will receive a postcard with your next transmission date.  If you need a refill on your cardiac medications before your next appointment, please call your pharmacy.

## 2018-01-23 NOTE — Progress Notes (Signed)
PCP: [name], MD Primary Cardiologist: Dr [name] Primary EP:  Dr Rayann Heman  [name] is a 67 y.o. male who presents today for routine electrophysiology followup.  Since last being seen in our clinic, the patient reports doing very well.  Primary concern is with renal disease. Today, he denies symptoms of palpitations, chest pain, shortness of breath, bleeding,  lower extremity edema, dizziness, presyncope, or syncope.  The patient is otherwise without complaint today.   Past Medical History:  Diagnosis Date  . Asthma   . Atrial flutter ([place])   . CAD (coronary artery disease)    a. s/p prior stenting of RCA in 1999 b. cath in 2003 showing moderate CAD c. NST in 2016 showing small area of ischemic and low-risk  . Chronic renal insufficiency   . COPD (chronic obstructive pulmonary disease) ([place])    with ongoing tobacco use and patient failed sham takes  . DM2 (diabetes mellitus, type 2) ([place])   . Dyslipidemia   . Edema    chronic lower extremity secondary to right heart failure and chronic venous insufficiency  . Headache   . HTN (hypertension)   . Morbid obesity ([place])   . Pneumonia   . Presence of permanent cardiac pacemaker   . 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   Marland Kitchen Reflux esophagitis    hx  . Renal insufficiency   . Sleep apnea   . Two-vessel coronary artery disease    moderate. by cath in 2003. Status post stenting of the mdi RCA November 1999 normal left ventricular ejection fraction   Past Surgical History:  Procedure Laterality Date  . ABDOMINAL SURGERY    . APPENDECTOMY    . BACK SURGERY    . CARDIAC CATHETERIZATION     stent  . CHOLECYSTECTOMY    . CLOSED REDUCTION SHOULDER DISLOCATION    . diabetic ulcers    . GROIN EXPLORATION    . OSTECTOMY Left 04/23/2017   Procedure: OSTECTOMY LEFT GREAT TOE;  Surgeon: [name], DPM;  Location: [facility];  Service: Podiatry;  Laterality: Left;   left great toe  . TUMOR REMOVAL     small intestine     ROS- all systems are reviewed and negative except as per HPI above  Current Outpatient Medications  Medication Sig Dispense Refill  . acetaminophen (TYLENOL) 325 MG tablet Take 975 mg daily by mouth. 3 tablets every morning    . albuterol (VENTOLIN HFA) 108 (90 BASE) MCG/ACT inhaler Inhale 2 puffs into the lungs every 6 (six) hours as needed for wheezing or shortness of breath.     . ALPRAZolam (XANAX) 0.5 MG tablet Take 1 tablet (0.5 mg total) by mouth at bedtime as needed for anxiety. 12 tablet 0  . Cholecalciferol (VITAMIN D3) 2000 units TABS Take 1 tablet by mouth daily.    Marland Kitchen diltiazem (CARDIZEM) 30 MG tablet Take 1 tablet (30 mg total) by mouth 2 (two) times daily as needed (As needed for palpitations.). 30 tablet 6  . ELIQUIS 5 MG TABS tablet TAKE ONE TABLET BY MOUTH TWICE DAILY 60 tablet 3  . ferrous sulfate 325 (65 FE) MG tablet Take 325 mg by mouth daily with breakfast.    . hydrALAZINE (APRESOLINE) 100 MG tablet TAKE ONE TABLET BY MOUTH THREE TIMES DAILY 90 tablet 3  . insulin glargine (LANTUS) 100 UNIT/ML injection Inject 35 Units daily after breakfast into the skin.     Marland Kitchen  LINZESS 290 MCG CAPS capsule Take 290 mcg by mouth daily as needed (for constipation).   6  . Melatonin 10 MG TABS Take 30 mg by mouth at bedtime.    . Multiple Vitamin (MULTIVITAMIN) tablet Take 1 tablet by mouth daily.      Marland Kitchen omeprazole (PRILOSEC) 20 MG capsule Take 20 mg daily by mouth.    . polyethylene glycol (MIRALAX / GLYCOLAX) packet Take 17 g by mouth every morning.    . potassium chloride SA (K-DUR,KLOR-CON) 20 MEQ tablet Take 1 tablet (20 mEq total) by mouth daily. 30 tablet 0  . QVAR REDIHALER 80 MCG/ACT inhaler Inhale 2 puffs into the lungs 2 (two) times daily.  12  . rosuvastatin (CRESTOR) 20 MG tablet TAKE ONE TABLET BY MOUTH EVERY DAY 30 tablet 3  . silver sulfADIAZINE (SILVADENE) 1 % cream Apply 1 application daily topically. For wound  care with dressing    . torsemide (DEMADEX) 100 MG tablet Take 50 mg by mouth 2 (two) times daily. Changed by Dr. Lowanda Foster this March.  4   No current facility-administered medications for this visit.     Physical Exam: Vitals:   01/23/18 0928  BP: (!) 144/78  Pulse: 64  SpO2: 96%  Weight: 244 lb (110.7 kg)  Height: 6\' 4"  (1.93 m)    GEN- The patient is well appearing, alert and oriented x 3 today.   Head- normocephalic, atraumatic Eyes-  Sclera clear, conjunctiva pink Ears- hearing intact Oropharynx- clear Lungs- diffuse expiratory wheezes, normal work of breathing Chest- pacemaker pocket is well healed Heart- Regular rate and rhythm, no murmurs, rubs or gallops, PMI not laterally displaced GI- soft, NT, ND, + BS Extremities- no clubbing, cyanosis, or edema  Pacemaker interrogation- reviewed in detail today,  See PACEART report  CXR reviewed from 11/05/17   Assessment and Plan:  1. 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  With adjustment last visit, he V paces <1%  2. Paroxysmal atrial fibrillation/ atrial flutter AF burden is 1 % (6% last visit) Given renal failure, amiodarone would be only AAD option.  Given paucity of symptoms, we have tried to avoid this previously.  No changes today  3. OSA Recently diagnosed He is working with pulmonary on initiation of treatment  4. CAD No ischemic symptoms  5. Tobacco/ COPD Cessation advised He is trying to quit but without success  6. Stage IV renal failrue with anemia Consider switching eliquis to coumadin (as above) He is reluctant to do so at this time  7. ED Reports erectile dysfunction Will give viagra 100mg , take 50mg  prn # 10 tablets  Follow-up with Dr [name] in 3 months I will see in 6 months  Thompson Grayer MD, [facility] 01/23/2018 9:44 AM

## 2018-01-25 LAB — CUP PACEART INCLINIC DEVICE CHECK
Brady Statistic RA Percent Paced: 29 %
Brady Statistic RV Percent Paced: 1 %
Date Time Interrogation Session: 20190906040000
Implantable Lead Implant Date: 20190514
Implantable Lead Implant Date: 20190514
Implantable Lead Location: 753859
Implantable Lead Location: 753860
Implantable Lead Model: 7741
Implantable Lead Model: 7742
Implantable Lead Serial Number: 1015225
Implantable Lead Serial Number: 1020907
Implantable Pulse Generator Implant Date: 20190514
Lead Channel Impedance Value: 701 Ohm
Lead Channel Impedance Value: 721 Ohm
Lead Channel Pacing Threshold Amplitude: 1 V
Lead Channel Pacing Threshold Amplitude: 2.8 V
Lead Channel Pacing Threshold Pulse Width: 0.4 ms
Lead Channel Pacing Threshold Pulse Width: 1 ms
Lead Channel Sensing Intrinsic Amplitude: 11.3 mV
Lead Channel Sensing Intrinsic Amplitude: 7.3 mV
Lead Channel Setting Pacing Amplitude: 2 V
Lead Channel Setting Pacing Amplitude: 3.5 V
Lead Channel Setting Pacing Pulse Width: 1 ms
Lead Channel Setting Sensing Sensitivity: 1.5 mV
Pulse Gen Serial Number: 832937

## 2018-01-26 DIAGNOSIS — R0902 Hypoxemia: Secondary | ICD-10-CM | POA: Diagnosis not present

## 2018-01-26 DIAGNOSIS — J449 Chronic obstructive pulmonary disease, unspecified: Secondary | ICD-10-CM | POA: Diagnosis not present

## 2018-01-27 ENCOUNTER — Encounter: Payer: Self-pay | Admitting: Pulmonary Disease

## 2018-01-29 ENCOUNTER — Telehealth: Payer: Self-pay | Admitting: Pulmonary Disease

## 2018-01-29 DIAGNOSIS — G4733 Obstructive sleep apnea (adult) (pediatric): Secondary | ICD-10-CM

## 2018-01-29 NOTE — Telephone Encounter (Signed)
Overnight oximetry did not reveal significant desaturations  Does not require oxygen supplementation at night

## 2018-01-29 NOTE — Telephone Encounter (Signed)
Called and spoke with the patient he is concern that his sleep study is a false negative due to the significant symptoms and also that he was unable to sleep in his normal sleeping position.   Dr. Ander Slade please advise.

## 2018-01-30 NOTE — Telephone Encounter (Signed)
Schedule patient for an in lab polysomnogram  Up to 25% of home studies may be false negative  polysomnogram in a monitored setting for significant daytime symptoms

## 2018-02-04 ENCOUNTER — Ambulatory Visit (INDEPENDENT_AMBULATORY_CARE_PROVIDER_SITE_OTHER): Payer: Medicare Other | Admitting: *Deleted

## 2018-02-04 DIAGNOSIS — R001 Bradycardia, unspecified: Secondary | ICD-10-CM

## 2018-02-04 DIAGNOSIS — R55 Syncope and collapse: Secondary | ICD-10-CM

## 2018-02-04 NOTE — Progress Notes (Signed)
Remote pacemaker transmission.   

## 2018-02-04 NOTE — Telephone Encounter (Signed)
I have Called the patient he is aware of these results and verbalized understanding and order has been placed.

## 2018-02-09 ENCOUNTER — Ambulatory Visit (INDEPENDENT_AMBULATORY_CARE_PROVIDER_SITE_OTHER): Payer: Medicare Other | Admitting: Vascular Surgery

## 2018-02-09 ENCOUNTER — Ambulatory Visit (HOSPITAL_COMMUNITY)
Admission: RE | Admit: 2018-02-09 | Discharge: 2018-02-09 | Disposition: A | Discharge status: Home / Self Care | Payer: Medicare Other | Source: Ambulatory Visit | Attending: Vascular Surgery | Admitting: Vascular Surgery

## 2018-02-09 ENCOUNTER — Encounter: Payer: Self-pay | Admitting: Vascular Surgery

## 2018-02-09 ENCOUNTER — Encounter (HOSPITAL_COMMUNITY): Payer: Self-pay

## 2018-02-09 VITALS — BP 183/99 | HR 61 | Temp 98.6°F | Resp 20 | Ht 76.0 in | Wt 240.0 lb

## 2018-02-09 DIAGNOSIS — N186 End stage renal disease: Secondary | ICD-10-CM | POA: Diagnosis not present

## 2018-02-09 DIAGNOSIS — I2584 Coronary atherosclerosis due to calcified coronary lesion: Secondary | ICD-10-CM | POA: Diagnosis not present

## 2018-02-09 DIAGNOSIS — N184 Chronic kidney disease, stage 4 (severe): Secondary | ICD-10-CM

## 2018-02-09 DIAGNOSIS — Z1389 Encounter for screening for other disorder: Secondary | ICD-10-CM | POA: Diagnosis not present

## 2018-02-09 DIAGNOSIS — Z01810 Encounter for preprocedural cardiovascular examination: Secondary | ICD-10-CM | POA: Diagnosis not present

## 2018-02-09 DIAGNOSIS — I2510 Atherosclerotic heart disease of native coronary artery without angina pectoris: Secondary | ICD-10-CM

## 2018-02-09 NOTE — H&P (View-Only) (Signed)
Vascular and Vein Specialist of [place]  Patient name: [name] MRN: [id number] DOB: 11-30-1950 Sex: male  REASON FOR CONSULT: Discuss access for hemodialysis  Seen today in our [place] office   HPI: [name] is a 67 y.o. male, who is here today for discussion of access for hemodialysis.  He has had progressive renal insufficiency and apparently has been very resistant to having access placed.  His deterioration of renal function has persisted and he is now seen for discussion of access.  He has never had access in the past.  He does have a left side transvenous pacemaker.  He has a long history of coronary disease with history of coronary stenting dating back to 1999.  He is right-handed.  Past Medical History:  Diagnosis Date  . Asthma   . Atrial flutter ([place])   . CAD (coronary artery disease)    a. s/p prior stenting of RCA in 1999 b. cath in 2003 showing moderate CAD c. NST in 2016 showing small area of ischemic and low-risk  . Chronic renal insufficiency   . COPD (chronic obstructive pulmonary disease) ([place])    with ongoing tobacco use and patient failed sham takes  . DM2 (diabetes mellitus, type 2) ([place])   . Dyslipidemia   . Edema    chronic lower extremity secondary to right heart failure and chronic venous insufficiency  . Headache   . HTN (hypertension)   . Morbid obesity ([place])   . Pneumonia   . Presence of permanent cardiac pacemaker   . 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   Marland Kitchen Reflux esophagitis    hx  . Renal insufficiency   . Sleep apnea   . Two-vessel coronary artery disease    moderate. by cath in 2003. Status post stenting of the mdi RCA November 1999 normal left ventricular ejection fraction    Family History  Problem Relation Age of Onset  . Diabetes Mother   . Alcohol abuse Father     SOCIAL HISTORY: Social History   Socioeconomic  History  . Marital status: Married    Spouse name: Not on file  . Number of children: Not on file  . Years of education: Not on file  . Highest education level: Not on file  Occupational History  . Not on file  Social Needs  . Financial resource strain: Not on file  . Food insecurity:    Worry: Not on file    Inability: Not on file  . Transportation needs:    Medical: Not on file    Non-medical: Not on file  Tobacco Use  . Smoking status: Current Every Day Smoker    Packs/day: 1.00    Years: 40.00    Pack years: 40.00    Types: Cigarettes    Start date: 05/20/1968  . Smokeless tobacco: Never Used  Substance and Sexual Activity  . Alcohol use: No    Alcohol/week: 0.0 standard drinks  . Drug use: No  . Sexual activity: Yes  Lifestyle  . Physical activity:    Days per week: Not on file    Minutes per session: Not on file  . Stress: Not on file  Relationships  . Social connections:    Talks on phone: Not on file    Gets together: Not on file    Attends religious service: Not on file    Active member of club or organization: Not on  file    Attends meetings of clubs or organizations: Not on file    Relationship status: Not on file  . Intimate partner violence:    Fear of current or ex partner: Not on file    Emotionally abused: Not on file    Physically abused: Not on file    Forced sexual activity: Not on file  Other Topics Concern  . Not on file  Social History Narrative   Patient continues to smoke.     No Known Allergies  Current Outpatient Medications  Medication Sig Dispense Refill  . carvedilol (COREG) 25 MG tablet Take 25 mg by mouth 2 (two) times daily with a meal.    . acetaminophen (TYLENOL) 325 MG tablet Take 975 mg daily by mouth. 3 tablets every morning    . albuterol (VENTOLIN HFA) 108 (90 BASE) MCG/ACT inhaler Inhale 2 puffs into the lungs every 6 (six) hours as needed for wheezing or shortness of breath.     . Cholecalciferol (VITAMIN D3) 2000  units TABS Take 1 tablet by mouth daily.    Marland Kitchen diltiazem (CARDIZEM) 30 MG tablet Take 1 tablet (30 mg total) by mouth 2 (two) times daily as needed (As needed for palpitations.). 30 tablet 6  . ELIQUIS 5 MG TABS tablet TAKE ONE TABLET BY MOUTH TWICE DAILY 60 tablet 3  . ferrous sulfate 325 (65 FE) MG tablet Take 325 mg by mouth daily with breakfast.    . hydrALAZINE (APRESOLINE) 100 MG tablet TAKE ONE TABLET BY MOUTH THREE TIMES DAILY 90 tablet 3  . insulin glargine (LANTUS) 100 UNIT/ML injection Inject 35 Units daily after breakfast into the skin.     Marland Kitchen LINZESS 290 MCG CAPS capsule Take 290 mcg by mouth daily as needed (for constipation).   6  . Melatonin 10 MG TABS Take 30 mg by mouth at bedtime.    . Multiple Vitamin (MULTIVITAMIN) tablet Take 1 tablet by mouth daily.      Marland Kitchen omeprazole (PRILOSEC) 20 MG capsule Take 20 mg daily by mouth.    . polyethylene glycol (MIRALAX / GLYCOLAX) packet Take 17 g by mouth every morning.    . potassium chloride SA (K-DUR,KLOR-CON) 20 MEQ tablet Take 1 tablet (20 mEq total) by mouth daily. 30 tablet 0  . QVAR REDIHALER 80 MCG/ACT inhaler Inhale 2 puffs into the lungs 2 (two) times daily.  12  . rosuvastatin (CRESTOR) 20 MG tablet TAKE ONE TABLET BY MOUTH EVERY DAY 30 tablet 3  . sildenafil (VIAGRA) 100 MG tablet Take 1 tablet (100 mg total) by mouth as needed for erectile dysfunction. 10 tablet 0  . silver sulfADIAZINE (SILVADENE) 1 % cream Apply 1 application daily topically. For wound care with dressing    . torsemide (DEMADEX) 100 MG tablet Take 50 mg by mouth 2 (two) times daily. Changed by Dr. Lowanda Foster this March.  4   No current facility-administered medications for this visit.     REVIEW OF SYSTEMS:  [X]  denotes positive finding, [ ]  denotes negative finding Cardiac  Comments:  Chest pain or chest pressure:    Shortness of breath upon exertion:    Short of breath when lying flat:    Irregular heart rhythm:        Vascular    Pain in calf,  thigh, or hip brought on by ambulation: x   Pain in feet at night that wakes you up from your sleep:     Blood clot in your veins:  Leg swelling:  x       Pulmonary    Oxygen at home:    Productive cough:  x   Wheezing:  x       Neurologic    Sudden weakness in arms or legs:     Sudden numbness in arms or legs:     Sudden onset of difficulty speaking or slurred speech:    Temporary loss of vision in one eye:     Problems with dizziness:         Gastrointestinal    Blood in stool:     Vomited blood:         Genitourinary    Burning when urinating:  x   Blood in urine:        Psychiatric    Major depression:         Hematologic    Bleeding problems:    Problems with blood clotting too easily:        Skin    Rashes or ulcers:        Constitutional    Fever or chills:      PHYSICAL EXAM: Vitals:   02/09/18 1405 02/09/18 1406  BP: (!) 189/90 (!) 183/99  Pulse: 70 61  Resp: 20   Temp: 98.6 F (37 C)   TempSrc: Temporal   Weight: 240 lb (108.9 kg)   Height: 6\' 4"  (1.93 m)     GENERAL: The patient is a well-nourished male, in no acute distress. The vital signs are documented above. CARDIOVASCULAR: Carotid arteries without bruits bilaterally.  2+ radial and 2+ ulnar arteries bilaterally.  Small surface veins bilaterally PULMONARY: There is good air exchange  ABDOMEN: Soft and non-tender  MUSCULOSKELETAL: There are no major deformities or cyanosis. NEUROLOGIC: No focal weakness or paresthesias are detected. SKIN: There are no ulcers or rashes noted. PSYCHIATRIC: The patient has a normal affect.  DATA:  He underwent noninvasive studies at [facility] today.  Shows normal arterial flow bilaterally.  He does have small to moderate surface veins bilaterally.  His right basilic vein does appear to be his best option for access on the right arm.   MEDICAL ISSUES: Had a very long discussion with patient and his wife regarding options options for hemodialysis.   I discussed tunneled catheter, AV fistula and AV graft.  Explained that with his left side pacer we would avoid this for dialysis due to the high likelihood of venous outflow obstruction with a transvenous pacemaker on that side.  Explained that this should not give him long-term limitation even though he is right-handed.  He is concerned since he is very active and use of his arms.  I did recommend a basilic vein as his initial access option.  I explained this would require 2 stages.  Initially the brachial basilic fistula followed by a transposition in 2 months if he had adequate maturation.  He has plans on a beach trip in 1 October and will schedule this for right arm AV fistula to follow.  I did explain the almost certainly need for ongoing maintenance of his fistula and also the significant potential for failure of his fistula to mature with his moderate size veins.  He understands and wishes to proceed   Rosetta Posner, MD [facility] Vascular and Vein Specialists of [facility] Tel [phone] Pager [phone]

## 2018-02-09 NOTE — Progress Notes (Signed)
Vascular and Vein Specialist of [place]  Patient name: TAIKI BUCKWALTER MRN: [id number] DOB: 1951-03-28 Sex: male  REASON FOR CONSULT: Discuss access for hemodialysis  Seen today in our [place] office   HPI: CARLES FLOREA is a 67 y.o. male, who is here today for discussion of access for hemodialysis.  He has had progressive renal insufficiency and apparently has been very resistant to having access placed.  His deterioration of renal function has persisted and he is now seen for discussion of access.  He has never had access in the past.  He does have a left side transvenous pacemaker.  He has a long history of coronary disease with history of coronary stenting dating back to 1999.  He is right-handed.  Past Medical History:  Diagnosis Date  . Asthma   . Atrial flutter ([place])   . CAD (coronary artery disease)    a. s/p prior stenting of RCA in 1999 b. cath in 2003 showing moderate CAD c. NST in 2016 showing small area of ischemic and low-risk  . Chronic renal insufficiency   . COPD (chronic obstructive pulmonary disease) ([place])    with ongoing tobacco use and patient failed sham takes  . DM2 (diabetes mellitus, type 2) ([place])   . Dyslipidemia   . Edema    chronic lower extremity secondary to right heart failure and chronic venous insufficiency  . Headache   . HTN (hypertension)   . Morbid obesity ([place])   . Pneumonia   . Presence of permanent cardiac pacemaker   . 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   Marland Kitchen Reflux esophagitis    hx  . Renal insufficiency   . Sleep apnea   . Two-vessel coronary artery disease    moderate. by cath in 2003. Status post stenting of the mdi RCA November 1999 normal left ventricular ejection fraction    Family History  Problem Relation Age of Onset  . Diabetes Mother   . Alcohol abuse Father     SOCIAL HISTORY: Social History   Socioeconomic  History  . Marital status: Married    Spouse name: Not on file  . Number of children: Not on file  . Years of education: Not on file  . Highest education level: Not on file  Occupational History  . Not on file  Social Needs  . Financial resource strain: Not on file  . Food insecurity:    Worry: Not on file    Inability: Not on file  . Transportation needs:    Medical: Not on file    Non-medical: Not on file  Tobacco Use  . Smoking status: Current Every Day Smoker    Packs/day: 1.00    Years: 40.00    Pack years: 40.00    Types: Cigarettes    Start date: 05/20/1968  . Smokeless tobacco: Never Used  Substance and Sexual Activity  . Alcohol use: No    Alcohol/week: 0.0 standard drinks  . Drug use: No  . Sexual activity: Yes  Lifestyle  . Physical activity:    Days per week: Not on file    Minutes per session: Not on file  . Stress: Not on file  Relationships  . Social connections:    Talks on phone: Not on file    Gets together: Not on file    Attends religious service: Not on file    Active member of club or organization: Not on  file    Attends meetings of clubs or organizations: Not on file    Relationship status: Not on file  . Intimate partner violence:    Fear of current or ex partner: Not on file    Emotionally abused: Not on file    Physically abused: Not on file    Forced sexual activity: Not on file  Other Topics Concern  . Not on file  Social History Narrative   Patient continues to smoke.     No Known Allergies  Current Outpatient Medications  Medication Sig Dispense Refill  . carvedilol (COREG) 25 MG tablet Take 25 mg by mouth 2 (two) times daily with a meal.    . acetaminophen (TYLENOL) 325 MG tablet Take 975 mg daily by mouth. 3 tablets every morning    . albuterol (VENTOLIN HFA) 108 (90 BASE) MCG/ACT inhaler Inhale 2 puffs into the lungs every 6 (six) hours as needed for wheezing or shortness of breath.     . Cholecalciferol (VITAMIN D3) 2000  units TABS Take 1 tablet by mouth daily.    Marland Kitchen diltiazem (CARDIZEM) 30 MG tablet Take 1 tablet (30 mg total) by mouth 2 (two) times daily as needed (As needed for palpitations.). 30 tablet 6  . ELIQUIS 5 MG TABS tablet TAKE ONE TABLET BY MOUTH TWICE DAILY 60 tablet 3  . ferrous sulfate 325 (65 FE) MG tablet Take 325 mg by mouth daily with breakfast.    . hydrALAZINE (APRESOLINE) 100 MG tablet TAKE ONE TABLET BY MOUTH THREE TIMES DAILY 90 tablet 3  . insulin glargine (LANTUS) 100 UNIT/ML injection Inject 35 Units daily after breakfast into the skin.     Marland Kitchen LINZESS 290 MCG CAPS capsule Take 290 mcg by mouth daily as needed (for constipation).   6  . Melatonin 10 MG TABS Take 30 mg by mouth at bedtime.    . Multiple Vitamin (MULTIVITAMIN) tablet Take 1 tablet by mouth daily.      Marland Kitchen omeprazole (PRILOSEC) 20 MG capsule Take 20 mg daily by mouth.    . polyethylene glycol (MIRALAX / GLYCOLAX) packet Take 17 g by mouth every morning.    . potassium chloride SA (K-DUR,KLOR-CON) 20 MEQ tablet Take 1 tablet (20 mEq total) by mouth daily. 30 tablet 0  . QVAR REDIHALER 80 MCG/ACT inhaler Inhale 2 puffs into the lungs 2 (two) times daily.  12  . rosuvastatin (CRESTOR) 20 MG tablet TAKE ONE TABLET BY MOUTH EVERY DAY 30 tablet 3  . sildenafil (VIAGRA) 100 MG tablet Take 1 tablet (100 mg total) by mouth as needed for erectile dysfunction. 10 tablet 0  . silver sulfADIAZINE (SILVADENE) 1 % cream Apply 1 application daily topically. For wound care with dressing    . torsemide (DEMADEX) 100 MG tablet Take 50 mg by mouth 2 (two) times daily. Changed by Dr. Lowanda Foster this March.  4   No current facility-administered medications for this visit.     REVIEW OF SYSTEMS:  [X]  denotes positive finding, [ ]  denotes negative finding Cardiac  Comments:  Chest pain or chest pressure:    Shortness of breath upon exertion:    Short of breath when lying flat:    Irregular heart rhythm:        Vascular    Pain in calf,  thigh, or hip brought on by ambulation: x   Pain in feet at night that wakes you up from your sleep:     Blood clot in your veins:  Leg swelling:  x       Pulmonary    Oxygen at home:    Productive cough:  x   Wheezing:  x       Neurologic    Sudden weakness in arms or legs:     Sudden numbness in arms or legs:     Sudden onset of difficulty speaking or slurred speech:    Temporary loss of vision in one eye:     Problems with dizziness:         Gastrointestinal    Blood in stool:     Vomited blood:         Genitourinary    Burning when urinating:  x   Blood in urine:        Psychiatric    Major depression:         Hematologic    Bleeding problems:    Problems with blood clotting too easily:        Skin    Rashes or ulcers:        Constitutional    Fever or chills:      PHYSICAL EXAM: Vitals:   02/09/18 1405 02/09/18 1406  BP: (!) 189/90 (!) 183/99  Pulse: 70 61  Resp: 20   Temp: 98.6 F (37 C)   TempSrc: Temporal   Weight: 240 lb (108.9 kg)   Height: 6\' 4"  (1.93 m)     GENERAL: The patient is a well-nourished male, in no acute distress. The vital signs are documented above. CARDIOVASCULAR: Carotid arteries without bruits bilaterally.  2+ radial and 2+ ulnar arteries bilaterally.  Small surface veins bilaterally PULMONARY: There is good air exchange  ABDOMEN: Soft and non-tender  MUSCULOSKELETAL: There are no major deformities or cyanosis. NEUROLOGIC: No focal weakness or paresthesias are detected. SKIN: There are no ulcers or rashes noted. PSYCHIATRIC: The patient has a normal affect.  DATA:  He underwent noninvasive studies at [facility] today.  Shows normal arterial flow bilaterally.  He does have small to moderate surface veins bilaterally.  His right basilic vein does appear to be his best option for access on the right arm.   MEDICAL ISSUES: Had a very long discussion with patient and his wife regarding options options for hemodialysis.   I discussed tunneled catheter, AV fistula and AV graft.  Explained that with his left side pacer we would avoid this for dialysis due to the high likelihood of venous outflow obstruction with a transvenous pacemaker on that side.  Explained that this should not give him long-term limitation even though he is right-handed.  He is concerned since he is very active and use of his arms.  I did recommend a basilic vein as his initial access option.  I explained this would require 2 stages.  Initially the brachial basilic fistula followed by a transposition in 2 months if he had adequate maturation.  He has plans on a beach trip in 1 October and will schedule this for right arm AV fistula to follow.  I did explain the almost certainly need for ongoing maintenance of his fistula and also the significant potential for failure of his fistula to mature with his moderate size veins.  He understands and wishes to proceed   Rosetta Posner, MD [facility] Vascular and Vein Specialists of [facility] Tel [phone] Pager [phone]

## 2018-02-10 ENCOUNTER — Other Ambulatory Visit: Payer: Self-pay | Admitting: *Deleted

## 2018-02-10 DIAGNOSIS — E1142 Type 2 diabetes mellitus with diabetic polyneuropathy: Secondary | ICD-10-CM | POA: Diagnosis not present

## 2018-02-10 DIAGNOSIS — L97522 Non-pressure chronic ulcer of other part of left foot with fat layer exposed: Secondary | ICD-10-CM | POA: Diagnosis not present

## 2018-02-10 DIAGNOSIS — S91332A Puncture wound without foreign body, left foot, initial encounter: Secondary | ICD-10-CM | POA: Diagnosis not present

## 2018-02-10 NOTE — Progress Notes (Signed)
Patient instructed to be at [facility] admitting at 5:30 am on 03/11/18 for surgery. NPO past MN night prior and must have a driver and caregiver for discharge. Hold Eliquis for 2 days pre-op. Expect a call and follow the detailed instructions received from the hospital pre-admission department for this surgery, insulin adjustment and meds to take day of surgery. Verbalized understanding to call if questions.

## 2018-02-11 ENCOUNTER — Encounter: Payer: Self-pay | Admitting: Cardiology

## 2018-02-18 DIAGNOSIS — I10 Essential (primary) hypertension: Secondary | ICD-10-CM | POA: Diagnosis not present

## 2018-02-18 DIAGNOSIS — Z79899 Other long term (current) drug therapy: Secondary | ICD-10-CM | POA: Diagnosis not present

## 2018-02-18 DIAGNOSIS — E559 Vitamin D deficiency, unspecified: Secondary | ICD-10-CM | POA: Diagnosis not present

## 2018-02-18 DIAGNOSIS — R809 Proteinuria, unspecified: Secondary | ICD-10-CM | POA: Diagnosis not present

## 2018-02-18 DIAGNOSIS — N184 Chronic kidney disease, stage 4 (severe): Secondary | ICD-10-CM | POA: Diagnosis not present

## 2018-02-18 DIAGNOSIS — D509 Iron deficiency anemia, unspecified: Secondary | ICD-10-CM | POA: Diagnosis not present

## 2018-02-19 DIAGNOSIS — Z6829 Body mass index (BMI) 29.0-29.9, adult: Secondary | ICD-10-CM | POA: Diagnosis not present

## 2018-02-19 DIAGNOSIS — N529 Male erectile dysfunction, unspecified: Secondary | ICD-10-CM | POA: Diagnosis not present

## 2018-02-19 DIAGNOSIS — N189 Chronic kidney disease, unspecified: Secondary | ICD-10-CM | POA: Diagnosis not present

## 2018-02-19 DIAGNOSIS — I209 Angina pectoris, unspecified: Secondary | ICD-10-CM | POA: Diagnosis not present

## 2018-02-19 DIAGNOSIS — I10 Essential (primary) hypertension: Secondary | ICD-10-CM | POA: Diagnosis not present

## 2018-02-19 DIAGNOSIS — E039 Hypothyroidism, unspecified: Secondary | ICD-10-CM | POA: Diagnosis not present

## 2018-02-19 DIAGNOSIS — M129 Arthropathy, unspecified: Secondary | ICD-10-CM | POA: Diagnosis not present

## 2018-02-20 DIAGNOSIS — Z23 Encounter for immunization: Secondary | ICD-10-CM | POA: Diagnosis not present

## 2018-02-25 DIAGNOSIS — N2581 Secondary hyperparathyroidism of renal origin: Secondary | ICD-10-CM | POA: Diagnosis not present

## 2018-02-25 DIAGNOSIS — R809 Proteinuria, unspecified: Secondary | ICD-10-CM | POA: Diagnosis not present

## 2018-02-25 DIAGNOSIS — N184 Chronic kidney disease, stage 4 (severe): Secondary | ICD-10-CM | POA: Diagnosis not present

## 2018-03-03 LAB — CUP PACEART REMOTE DEVICE CHECK
Date Time Interrogation Session: 20191015094240
Implantable Lead Implant Date: 20190514
Implantable Lead Implant Date: 20190514
Implantable Lead Location: 753859
Implantable Lead Location: 753860
Implantable Lead Model: 7741
Implantable Lead Model: 7742
Implantable Lead Serial Number: 1015225
Implantable Lead Serial Number: 1020907
Implantable Pulse Generator Implant Date: 20190514
Pulse Gen Serial Number: 832937

## 2018-03-10 ENCOUNTER — Other Ambulatory Visit: Payer: Self-pay

## 2018-03-10 ENCOUNTER — Encounter (HOSPITAL_COMMUNITY): Payer: Self-pay | Admitting: *Deleted

## 2018-03-10 NOTE — Progress Notes (Signed)
Pt denies SOB and chest pain. Pt under the care of Dr. [name], Cardiology. Pt denies recent labs.  Pt stated that last dose of Eliquis was Sunday as instructed by MD. Pt made aware to stop taking vitamins, fish oil, Melatonin and herbal medications. Do not take any NSAIDs ie: Ibuprofen, Advil, Naproxen (Aleve), Motrin, BC and Goody Powder. Pt made aware to take 17 units of Lantus insulin DOS if blood glucose is > 70. Pt made aware to check BG every 2 hours prior to arrival to hospital on DOS. Pt made aware to treat a BG < 70 with 4 glucose tabs, wait 15 minutes after intervention to recheck BG, if BG remains < 70, call Short Stay unit to speak with a nurse. Pt verbalized understanding of all pre-op instructions. See anesthesia note.

## 2018-03-10 NOTE — Anesthesia Preprocedure Evaluation (Addendum)
Anesthesia Evaluation  Patient identified by MRN, date of birth, ID band Patient awake    Reviewed: Allergy & Precautions, NPO status , Patient's Chart, lab work & pertinent test results, reviewed documented beta blocker date and time   History of Anesthesia Complications Negative for: history of anesthetic complications  Airway Mallampati: II  TM Distance: >3 FB Neck ROM: Full    Dental  (+) Teeth Intact, Dental Advisory Given   Pulmonary asthma , COPD,  COPD inhaler, Current Smoker,     + wheezing (bilateral)      Cardiovascular hypertension, Pt. on medications and Pt. on home beta blockers + CAD, + Cardiac Stents, + Peripheral Vascular Disease, +CHF and + Orthopnea  + dysrhythmias Atrial Fibrillation + pacemaker  Rhythm:Regular Rate:Normal  TTE 04/2017: Normal LV systolic function; severe LVH; moderate diastolic dysfunction; mild to moderate LAE  Hx of sinus bradycardia s/p permanent pacemaker placement  CAD with stent to RCA in 199 and repeat cath 2016 with no interventions   Neuro/Psych Anxiety negative neurological ROS     GI/Hepatic Neg liver ROS, GERD  Medicated,  Endo/Other  negative endocrine ROSdiabetes, Well Controlled, Type 2  Renal/GU CRFRenal disease     Musculoskeletal negative musculoskeletal ROS (+)   Abdominal   Peds  Hematology negative hematology ROS (+)   Anesthesia Other Findings Day of surgery medications reviewed with the patient.  Reproductive/Obstetrics                            Anesthesia Physical Anesthesia Plan  ASA: IV  Anesthesia Plan: MAC   Post-op Pain Management:    Induction:   PONV Risk Score and Plan: Ondansetron and Treatment may vary due to age or medical condition  Airway Management Planned: Natural Airway and Nasal Cannula  Additional Equipment:   Intra-op Plan:   Post-operative Plan:   Informed Consent: I have reviewed the  patients History and Physical, chart, labs and discussed the procedure including the risks, benefits and alternatives for the proposed anesthesia with the patient or authorized representative who has indicated his/her understanding and acceptance.     Plan Discussed with: CRNA and Surgeon  Anesthesia Plan Comments:        Anesthesia Quick Evaluation

## 2018-03-10 NOTE — Progress Notes (Signed)
Anesthesia Chart Review: SAME DAY WORKUP   Case:  [id number] Date/Time:  03/11/18 0715   Procedure:  ARTERIOVENOUS (AV) FISTULA CREATION RIGHT ARM (Right )   Anesthesia type:  Monitor Anesthesia Care   Pre-op diagnosis:  CHRONIC KIDNEY DISEASE FOR HEMODIALYSIS ACCESS   Location:  [facility] ROOM 11 / [place] OR   Surgeon:  Rosetta Posner, MD      DISCUSSION: 67 yo male current smoker. 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 s/p prior stenting of RCA in 1999 b. cath in 2003 showing moderate CAD c. NST in 2016 showing small area of ischemic and low-risk),Buerger's disease (PVD s/p toe amputations), atrial flutteron apixaban,diastolic CHF. Pt had [employer] pacemaker placed at outside facility in May 2019 for treatment of tachy-brady syndrome.  Pt was admitted to hospital in [facility] 09/28/2017 with dizziness and syncope. EKG showed sinus brady to 46. CT head no acute process. EEG normal. Carotid US negative. Echo 09/2017 unclear report. States "normal LV function" but body or report indicates LVEF 35%. Notes indicate significant sinus bradycardia, sinus arrest. 5 second pause noted during admission. Had [employer] dual chamber pacemaker placed.  Per Dr. Jackalyn Lombard note 01/23/2018 "Since last being seen in our clinic, the patient reports doing very well.  Primary concern is with renal disease. Today, he denies symptoms of palpitations, chest pain, shortness of breath, bleeding,  lower extremity edema, dizziness, presyncope, or syncope.  The patient is otherwise without complaint today.Marland KitchenMarland KitchenAF burden is 1 % (6% last visit)."  Anticipate he can proceed with surgery as planned if DOS labs acceptable.  VS: There were no vitals taken for this visit.  PROVIDERS: [name], MD is PCP  Thompson Grayer, MD is EP Cardiologist last seen 01/23/2018  Carlyle Dolly, MD is Cardiologist last seen 10/16/2017  LABS: Will need DOS labs  Labs Reviewed - No data  to display   IMAGES: CHEST - 2 VIEW  COMPARISON:  CT 09/04/2017.  Chest x-ray 05/09/2017.  FINDINGS: Interim placement cardiac pacer with lead tips over the right atrium and right ventricle. Heart size normal. Normal pulmonary vascularity. No acute pulmonary disease. Old right rib fractures.  IMPRESSION: Interim placement of cardiac pacer with lead tips over the right atrium right ventricle. Heart size stable. No pneumothorax.  EKG: 11/05/17: A-paced rhythm with prolonged AV conduction. ST & T wave abnormality, consider inferolateral ischemia.   CV: 05/2001 Cath FINDINGS:  1. Left main trunk: Medium caliber vessel. This is a long vessel with a  distal taper of 20-30%.  2. Left anterior descending artery: This is a medium caliber vessel that  provides a large bifurcating first diagonal branch in the proximal segment  and before then extending to the apex, the LAD tapers significantly after  the diagonal branch. There is moderate disease of 50-60% after the  diagonal branch and then a focal eccentric narrowing of 60-70% in the mid  section of the LAD. The diagonal branch has moderate disease of 30-40% in  the proximal segment. The lateral division is a small caliber vessel with  an ostial narrowing of 60%.  3. Left circumflex artery: This is a small caliber vessel that provides a  trivial first marginal branch in the mid section and a medium caliber  second marginal branch distally. The mid AV circumflex at the takeoff of  the first and second diagonal branches had moderate diffuse disease of  60-70%.  4. Right coronary artery:  Dominant. This is a large caliber vessel that  provides a posterior descending artery and two posteroventricular branches  in the terminal segment. The right coronary artery has evidence of an  existing stent in the mid section that is widely patent. Prior to stent is  a focal discrete narrowing of 40-50%. Distal to the stent is  moderate  disease of 30%. The distal branch vessels also have mild disease of 30%.  5. Left ventricle: Normal end-systolic and end-diastolic dimensions. Overall  left ventricular function is well preserved. Ejection fraction is greater  than 65%. No mitral regurgitation. LV pressure is 180/10, aortic is  180/90. LV EDP equals 20.  ASSESSMENT AND PLAN: [name] is a 67 year old gentleman with moderate  three-vessel coronary artery disease. The patients plaque burden in the left  anterior descending artery and circumflex appears to have increased somewhat  compared to his previous angiogram. Unfortunately, the mid and distal left  anterior descending artery is a small caliber vessel with a long diffuse  segment of disease and would be associated with a high restenosis rate.  Similarly, the circumflex would suffer the same fate.  Further assessment with a stress imaging study would be prudent to isolate the  area of ischemia. Percutaneous intervention may then be targeted if  indicated. In addition, aggressive medical therapy should be pursued as the  patient currently is not on any anti-anginal therapy and has poorly controlled  hypertension. The patient will also be counseled regarding smoking cessation.    07/2013 PFTs Mild obstruction    10/2014 Lexiscan  Unable to adequately ambulate to treadmill due to right hip pain. Lexiscan employed.   Defect 1: There is a small defect of mild severity present in the mid inferior and apical inferior location. This defect is partially reversible.   This is a low risk study.   Nuclear stress EF: 58%.   Small region of apical inferior ischemia    07/2015 Echo Study Conclusions  - Left ventricle: The cavity size was normal. Wall thickness was  increased increased in a pattern of mild to moderate LVH.  Systolic function was normal. The estimated ejection fraction was  in the range of 60% to 65%.  Diastolic function is abnormal,  indeterminate grade. Wall motion was normal; there were no  regional wall motion abnormalities. - Aortic valve: Mildly calcified annulus. Trileaflet; mildly  thickened leaflets. Valve area (VTI): 2.39 cm^2. Valve area  (Vmax): 2.58 cm^2. Valve area (Vmean): 2.7 cm^2. - Mitral valve: Mildly calcified annulus. Normal thickness leaflets  . - Technically adequate study.  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 Recommend followup by ultrasound in 2 years. This recommendation follows ACR consensus guidelines:   Jan 2019 nuclear stress  T wave inversions in inferior leads and nonspecific T wave abnormalities in V6 seen throughout study.  The study is normal. No myocardial ischemia or scar.  This is a low risk study.  Nuclear stress EF: 56%.   07/2017 event monitor  14 day event monitor  Min HR 51, Max HR 115, Avg HR 69  Telemetry tracings show sinus rhythm and rate controlled atrial fibrillation  Reported symptoms correlated with rate controlled afib  09/2017 Echo (outside record, unclear report. States "normal LV function"but body or report indicates LVEF 35%): M-mode dimensions: Left ventricle 5.05.  Ejection fraction 69%.  Right ventricle 2.96.  Left atrium 4.6.  Septum 1.33.  Posterior wall 1.72.  Findings: Moderate concentric hypertrophy of left ventricle noted.  No pericardial effusion, intracardiac thrombi, or mass noted.  No definite wall motion abnormality noted.  Technically, this study is difficult to obtain.  By real-time, the EF is in the neighborhood of 35%.  Minimal mitral annular desiccation noted.  Left atrium is mildly dilated.  Left ventricle is normal.  Right ventricle is normal.  Right atrium is normal.  Aortic root is normal.  Mild aortic sclerosis noted.  No pericardial effusion noted.  No aortic insufficiency or  stenosis noted.  Diastolic function is normal.  Trace mitral regurgitation noted.  Right-sided chambers are not adequately evaluated.  Tricuspid regurgitation is not quantified.  Conclusion: Technically, this study is difficult to obtain.  Mild concentric hypertrophy of the left ventricle.  Normal left ventricular function with no definite wall motion normality.  Trace mitral regurgitation.  Minimal mitral annular desiccation and mild aortic Gross.  Mild left atrial dilation.   Past Medical History:  Diagnosis Date  . Asthma   . Atrial flutter ([place])   . CAD (coronary artery disease)    a. s/p prior stenting of RCA in 1999 b. cath in 2003 showing moderate CAD c. NST in 2016 showing small area of ischemic and low-risk  . Chronic renal insufficiency   . COPD (chronic obstructive pulmonary disease) ([place])    with ongoing tobacco use and patient failed sham takes  . DM2 (diabetes mellitus, type 2) ([place])   . Dyslipidemia   . Edema    chronic lower extremity secondary to right heart failure and chronic venous insufficiency  . Headache   . HTN (hypertension)   . Morbid obesity ([place])   . Pneumonia   . Presence of permanent cardiac pacemaker   . 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   Marland Kitchen Reflux esophagitis    hx  . Renal insufficiency   . Sleep apnea   . Two-vessel coronary artery disease    moderate. by cath in 2003. Status post stenting of the mdi RCA November 1999 normal left ventricular ejection fraction    Past Surgical History:  Procedure Laterality Date  . ABDOMINAL SURGERY    . APPENDECTOMY    . BACK SURGERY    . CARDIAC CATHETERIZATION     stent  . CHOLECYSTECTOMY    . CLOSED REDUCTION SHOULDER DISLOCATION    . diabetic ulcers    . GROIN EXPLORATION    . OSTECTOMY Left 04/23/2017   Procedure: OSTECTOMY LEFT GREAT TOE;  Surgeon: [name], DPM;  Location: [facility];  Service: Podiatry;  Laterality: Left;  left  great toe  . TUMOR REMOVAL     small intestine     MEDICATIONS: No current facility-administered medications for this encounter.    Marland Kitchen acetaminophen (TYLENOL) 325 MG tablet  . albuterol (VENTOLIN HFA) 108 (90 BASE) MCG/ACT inhaler  . carvedilol (COREG) 25 MG tablet  . Cholecalciferol (VITAMIN D3) 2000 units TABS  . diltiazem (CARDIZEM) 30 MG tablet  . ELIQUIS 5 MG TABS tablet  . hydrALAZINE (APRESOLINE) 100 MG tablet  . insulin glargine (LANTUS) 100 UNIT/ML injection  . iron polysaccharides (NIFEREX) 150 MG capsule  . LINZESS 290 MCG CAPS capsule  . Melatonin 10 MG TABS  . Multiple Vitamin (MULTIVITAMIN) tablet  . omeprazole (PRILOSEC) 20 MG capsule  . polyethylene glycol (MIRALAX / GLYCOLAX) packet  . potassium chloride SA (K-DUR,KLOR-CON) 20 MEQ tablet  . QVAR REDIHALER  80 MCG/ACT inhaler  . rosuvastatin (CRESTOR) 20 MG tablet  . sildenafil (VIAGRA) 100 MG tablet  . silver sulfADIAZINE (SILVADENE) 1 % cream  . torsemide (DEMADEX) 100 MG tablet     Wynonia Musty [facility] Short Stay Center/Anesthesiology Phone [phone] 03/10/2018 10:47 AM

## 2018-03-11 ENCOUNTER — Ambulatory Visit (HOSPITAL_COMMUNITY): Payer: Medicare Other | Admitting: Physician Assistant

## 2018-03-11 ENCOUNTER — Encounter (HOSPITAL_COMMUNITY)
Admission: RE | Disposition: A | Discharge status: Home / Self Care | Payer: Self-pay | Source: Ambulatory Visit | Attending: Vascular Surgery

## 2018-03-11 ENCOUNTER — Ambulatory Visit (HOSPITAL_COMMUNITY)
Admission: RE | Admit: 2018-03-11 | Discharge: 2018-03-11 | Disposition: A | Discharge status: Home / Self Care | Payer: Medicare Other | Source: Ambulatory Visit | Attending: Vascular Surgery | Admitting: Vascular Surgery

## 2018-03-11 ENCOUNTER — Encounter (HOSPITAL_COMMUNITY): Payer: Self-pay | Admitting: *Deleted

## 2018-03-11 ENCOUNTER — Other Ambulatory Visit: Payer: Self-pay

## 2018-03-11 DIAGNOSIS — I5030 Unspecified diastolic (congestive) heart failure: Secondary | ICD-10-CM | POA: Insufficient documentation

## 2018-03-11 DIAGNOSIS — E1122 Type 2 diabetes mellitus with diabetic chronic kidney disease: Secondary | ICD-10-CM | POA: Insufficient documentation

## 2018-03-11 DIAGNOSIS — Z951 Presence of aortocoronary bypass graft: Secondary | ICD-10-CM | POA: Insufficient documentation

## 2018-03-11 DIAGNOSIS — N184 Chronic kidney disease, stage 4 (severe): Secondary | ICD-10-CM | POA: Diagnosis not present

## 2018-03-11 DIAGNOSIS — I130 Hypertensive heart and chronic kidney disease with heart failure and stage 1 through stage 4 chronic kidney disease, or unspecified chronic kidney disease: Secondary | ICD-10-CM | POA: Insufficient documentation

## 2018-03-11 DIAGNOSIS — Z89429 Acquired absence of other toe(s), unspecified side: Secondary | ICD-10-CM | POA: Insufficient documentation

## 2018-03-11 DIAGNOSIS — I4892 Unspecified atrial flutter: Secondary | ICD-10-CM | POA: Diagnosis not present

## 2018-03-11 DIAGNOSIS — I2510 Atherosclerotic heart disease of native coronary artery without angina pectoris: Secondary | ICD-10-CM | POA: Diagnosis not present

## 2018-03-11 DIAGNOSIS — E1151 Type 2 diabetes mellitus with diabetic peripheral angiopathy without gangrene: Secondary | ICD-10-CM | POA: Diagnosis not present

## 2018-03-11 DIAGNOSIS — Z79899 Other long term (current) drug therapy: Secondary | ICD-10-CM | POA: Insufficient documentation

## 2018-03-11 DIAGNOSIS — N186 End stage renal disease: Secondary | ICD-10-CM

## 2018-03-11 DIAGNOSIS — Z794 Long term (current) use of insulin: Secondary | ICD-10-CM | POA: Diagnosis not present

## 2018-03-11 DIAGNOSIS — Z955 Presence of coronary angioplasty implant and graft: Secondary | ICD-10-CM | POA: Insufficient documentation

## 2018-03-11 DIAGNOSIS — N183 Chronic kidney disease, stage 3 unspecified: Secondary | ICD-10-CM

## 2018-03-11 DIAGNOSIS — I5032 Chronic diastolic (congestive) heart failure: Secondary | ICD-10-CM | POA: Diagnosis not present

## 2018-03-11 DIAGNOSIS — E785 Hyperlipidemia, unspecified: Secondary | ICD-10-CM | POA: Diagnosis not present

## 2018-03-11 DIAGNOSIS — Z950 Presence of cardiac pacemaker: Secondary | ICD-10-CM | POA: Insufficient documentation

## 2018-03-11 DIAGNOSIS — G4733 Obstructive sleep apnea (adult) (pediatric): Secondary | ICD-10-CM | POA: Insufficient documentation

## 2018-03-11 DIAGNOSIS — G4730 Sleep apnea, unspecified: Secondary | ICD-10-CM | POA: Diagnosis not present

## 2018-03-11 DIAGNOSIS — F17210 Nicotine dependence, cigarettes, uncomplicated: Secondary | ICD-10-CM | POA: Diagnosis not present

## 2018-03-11 DIAGNOSIS — J449 Chronic obstructive pulmonary disease, unspecified: Secondary | ICD-10-CM | POA: Insufficient documentation

## 2018-03-11 DIAGNOSIS — N185 Chronic kidney disease, stage 5: Secondary | ICD-10-CM | POA: Diagnosis not present

## 2018-03-11 DIAGNOSIS — K210 Gastro-esophageal reflux disease with esophagitis: Secondary | ICD-10-CM | POA: Insufficient documentation

## 2018-03-11 DIAGNOSIS — Z7901 Long term (current) use of anticoagulants: Secondary | ICD-10-CM | POA: Diagnosis not present

## 2018-03-11 HISTORY — DX: Presence of external hearing-aid: Z97.4

## 2018-03-11 HISTORY — DX: Presence of spectacles and contact lenses: Z97.3

## 2018-03-11 HISTORY — PX: AV FISTULA PLACEMENT: SHX1204

## 2018-03-11 LAB — GLUCOSE, CAPILLARY
Glucose-Capillary: 141 mg/dL — ABNORMAL HIGH (ref 70–99)
Glucose-Capillary: 128 mg/dL — ABNORMAL HIGH (ref 70–99)

## 2018-03-11 LAB — POCT I-STAT 4, (NA,K, GLUC, HGB,HCT)
Glucose, Bld: 135 mg/dL — ABNORMAL HIGH (ref 70–99)
HCT: 35 % — ABNORMAL LOW (ref 39.0–52.0)
Hemoglobin: 11.9 g/dL — ABNORMAL LOW (ref 13.0–17.0)
Potassium: 3.5 mmol/L (ref 3.5–5.1)
Sodium: 142 mmol/L (ref 135–145)

## 2018-03-11 SURGERY — ARTERIOVENOUS (AV) FISTULA CREATION
Anesthesia: Monitor Anesthesia Care | Site: Arm Upper | Laterality: Right

## 2018-03-11 MED ORDER — SODIUM CHLORIDE 0.9 % IV SOLN: INTRAVENOUS | Status: DC

## 2018-03-11 MED ORDER — FENTANYL CITRATE (PF) 100 MCG/2ML IJ SOLN
INTRAMUSCULAR | Status: AC
  Filled 2018-03-11: qty 2

## 2018-03-11 MED ORDER — EPHEDRINE SULFATE-NACL 50-0.9 MG/10ML-% IV SOSY
PREFILLED_SYRINGE | INTRAVENOUS | Status: DC | PRN
  Administered 2018-03-11: 10 mg via INTRAVENOUS

## 2018-03-11 MED ORDER — SODIUM CHLORIDE 0.9 % IV SOLN
INTRAVENOUS | Status: DC | PRN
  Administered 2018-03-11: 500 mL

## 2018-03-11 MED ORDER — PROPOFOL 10 MG/ML IV BOLUS
INTRAVENOUS | Status: AC
  Filled 2018-03-11: qty 20

## 2018-03-11 MED ORDER — ACETAMINOPHEN 10 MG/ML IV SOLN: 1000.0000 mg | Freq: Once | INTRAVENOUS | Status: DC | PRN

## 2018-03-11 MED ORDER — MIDAZOLAM HCL 5 MG/5ML IJ SOLN
INTRAMUSCULAR | Status: DC | PRN
  Administered 2018-03-11: 2 mg via INTRAVENOUS

## 2018-03-11 MED ORDER — LIDOCAINE-EPINEPHRINE 0.5 %-1:200000 IJ SOLN
INTRAMUSCULAR | Status: AC
  Filled 2018-03-11: qty 1

## 2018-03-11 MED ORDER — FENTANYL CITRATE (PF) 100 MCG/2ML IJ SOLN
INTRAMUSCULAR | Status: DC | PRN
  Administered 2018-03-11 (×2): 50 ug via INTRAVENOUS

## 2018-03-11 MED ORDER — 0.9 % SODIUM CHLORIDE (POUR BTL) OPTIME
TOPICAL | Status: DC | PRN
  Administered 2018-03-11: 1000 mL

## 2018-03-11 MED ORDER — FENTANYL CITRATE (PF) 250 MCG/5ML IJ SOLN
INTRAMUSCULAR | Status: AC
  Filled 2018-03-11: qty 5

## 2018-03-11 MED ORDER — SODIUM CHLORIDE 0.9 % IV SOLN
INTRAVENOUS | Status: AC
  Filled 2018-03-11: qty 1.2

## 2018-03-11 MED ORDER — OXYCODONE-ACETAMINOPHEN 5-325 MG PO TABS: 1.0000 | ORAL_TABLET | Freq: Four times a day (QID) | ORAL | 0 refills | Status: DC | PRN

## 2018-03-11 MED ORDER — LIDOCAINE-EPINEPHRINE 0.5 %-1:200000 IJ SOLN
INTRAMUSCULAR | Status: DC | PRN
  Administered 2018-03-11: 10 mL

## 2018-03-11 MED ORDER — MIDAZOLAM HCL 2 MG/2ML IJ SOLN
INTRAMUSCULAR | Status: AC
  Filled 2018-03-11: qty 2

## 2018-03-11 MED ORDER — CHLORHEXIDINE GLUCONATE 4 % EX LIQD: 60.0000 mL | Freq: Once | CUTANEOUS | Status: DC

## 2018-03-11 MED ORDER — PROMETHAZINE HCL 25 MG/ML IJ SOLN: 6.2500 mg | INTRAMUSCULAR | Status: DC | PRN

## 2018-03-11 MED ORDER — SODIUM CHLORIDE 0.9 % IV SOLN
INTRAVENOUS | Status: DC | PRN
  Administered 2018-03-11: 07:00:00 via INTRAVENOUS

## 2018-03-11 MED ORDER — CEFAZOLIN SODIUM-DEXTROSE 2-4 GM/100ML-% IV SOLN
2.0000 g | INTRAVENOUS | Status: AC
  Administered 2018-03-11: 2 g via INTRAVENOUS
  Filled 2018-03-11: qty 100

## 2018-03-11 MED ORDER — PROPOFOL 500 MG/50ML IV EMUL
INTRAVENOUS | Status: DC | PRN
  Administered 2018-03-11: 50 ug/kg/min via INTRAVENOUS

## 2018-03-11 MED ORDER — LIDOCAINE 2% (20 MG/ML) 5 ML SYRINGE
INTRAMUSCULAR | Status: DC | PRN
  Administered 2018-03-11: 100 mg via INTRAVENOUS

## 2018-03-11 MED ORDER — PROPOFOL 10 MG/ML IV BOLUS
INTRAVENOUS | Status: DC | PRN
  Administered 2018-03-11 (×2): 20 mg via INTRAVENOUS

## 2018-03-11 MED ORDER — FENTANYL CITRATE (PF) 100 MCG/2ML IJ SOLN
25.0000 ug | INTRAMUSCULAR | Status: DC | PRN
  Administered 2018-03-11: 50 ug via INTRAVENOUS

## 2018-03-11 SURGICAL SUPPLY — 34 items
ADH SKN CLS APL DERMABOND .7 (GAUZE/BANDAGES/DRESSINGS) ×1
ARMBAND PINK RESTRICT EXTREMIT (MISCELLANEOUS) ×4 IMPLANT
CANISTER SUCT 3000ML PPV (MISCELLANEOUS) ×2 IMPLANT
CANNULA VESSEL 3MM 2 BLNT TIP (CANNULA) ×2 IMPLANT
CLIP LIGATING EXTRA MED SLVR (CLIP) ×2 IMPLANT
CLIP LIGATING EXTRA SM BLUE (MISCELLANEOUS) ×2 IMPLANT
COVER PROBE W GEL 5X96 (DRAPES) ×2 IMPLANT
COVER SURGICAL LIGHT HANDLE (MISCELLANEOUS) ×1 IMPLANT
COVER WAND RF STERILE (DRAPES) ×2 IMPLANT
DECANTER SPIKE VIAL GLASS SM (MISCELLANEOUS) ×2 IMPLANT
DERMABOND ADVANCED (GAUZE/BANDAGES/DRESSINGS) ×1
DERMABOND ADVANCED .7 DNX12 (GAUZE/BANDAGES/DRESSINGS) ×1 IMPLANT
ELECT REM PT RETURN 9FT ADLT (ELECTROSURGICAL) ×2
ELECTRODE REM PT RTRN 9FT ADLT (ELECTROSURGICAL) ×1 IMPLANT
GLOVE BIO SURGEON STRL SZ 6.5 (GLOVE) ×2 IMPLANT
GLOVE BIOGEL PI IND STRL 6.5 (GLOVE) IMPLANT
GLOVE BIOGEL PI IND STRL 7.0 (GLOVE) IMPLANT
GLOVE BIOGEL PI INDICATOR 6.5 (GLOVE) ×2
GLOVE BIOGEL PI INDICATOR 7.0 (GLOVE) ×1
GLOVE SS BIOGEL STRL SZ 7.5 (GLOVE) ×1 IMPLANT
GLOVE SUPERSENSE BIOGEL SZ 7.5 (GLOVE) ×1
GOWN STRL REUS W/ TWL LRG LVL3 (GOWN DISPOSABLE) ×3 IMPLANT
GOWN STRL REUS W/TWL LRG LVL3 (GOWN DISPOSABLE) ×6
KIT BASIN OR (CUSTOM PROCEDURE TRAY) ×2 IMPLANT
KIT TURNOVER KIT B (KITS) ×2 IMPLANT
NS IRRIG 1000ML POUR BTL (IV SOLUTION) ×2 IMPLANT
PACK CV ACCESS (CUSTOM PROCEDURE TRAY) ×2 IMPLANT
PAD ARMBOARD 7.5X6 YLW CONV (MISCELLANEOUS) ×4 IMPLANT
SUT PROLENE 6 0 CC (SUTURE) ×2 IMPLANT
SUT VIC AB 3-0 SH 27 (SUTURE) ×2
SUT VIC AB 3-0 SH 27X BRD (SUTURE) ×1 IMPLANT
TOWEL GREEN STERILE (TOWEL DISPOSABLE) ×2 IMPLANT
UNDERPAD 30X30 (UNDERPADS AND DIAPERS) ×2 IMPLANT
WATER STERILE IRR 1000ML POUR (IV SOLUTION) ×2 IMPLANT

## 2018-03-11 NOTE — Transfer of Care (Signed)
Immediate Anesthesia Transfer of Care Note  Patient: [name]  Procedure(s) Performed: CREATION Brachiocephalic Fistula RIGHT ARM (Right Arm Upper)  Patient Location: PACU  Anesthesia Type:MAC  Level of Consciousness: drowsy and patient cooperative  Airway & Oxygen Therapy: Patient Spontanous Breathing  Post-op Assessment: Report given to RN and Post -op Vital signs reviewed and stable  Post vital signs: Reviewed and stable  Last Vitals:  Vitals Value Taken Time  BP 106/49 03/11/2018  8:42 AM  Temp    Pulse 61 03/11/2018  8:44 AM  Resp 25 03/11/2018  8:44 AM  SpO2 95 % 03/11/2018  8:44 AM  Vitals shown include unvalidated device data.  Last Pain:  Vitals:   03/11/18 0626  PainSc: 0-No pain      Patients Stated Pain Goal: 5 (51/76/16 0737)  Complications: No apparent anesthesia complications

## 2018-03-11 NOTE — Interval H&P Note (Signed)
History and Physical Interval Note:  03/11/2018 7:17 AM  [name]  has presented today for surgery, with the diagnosis of CHRONIC KIDNEY DISEASE FOR HEMODIALYSIS ACCESS  The various methods of treatment have been discussed with the patient and family. After consideration of risks, benefits and other options for treatment, the patient has consented to  Procedure(s): ARTERIOVENOUS (AV) FISTULA CREATION RIGHT ARM (Right) as a surgical intervention .  The patient's history has been reviewed, patient examined, no change in status, stable for surgery.  I have reviewed the patient's chart and labs.  Questions were answered to the patient's satisfaction.     Curt Jews

## 2018-03-11 NOTE — Anesthesia Procedure Notes (Signed)
Procedure Name: MAC Date/Time: 03/11/2018 7:35 AM Performed by: Lance Coon, CRNA Pre-anesthesia Checklist: Patient identified, Emergency Drugs available, Suction available, Timeout performed and Patient being monitored Patient Re-evaluated:Patient Re-evaluated prior to induction Oxygen Delivery Method: Simple face mask

## 2018-03-11 NOTE — Op Note (Signed)
    OPERATIVE REPORT  DATE OF SURGERY: 03/11/2018  PATIENT: [name], 67 y.o. male MRN: [id number]  DOB: 1951-01-05  PRE-OPERATIVE DIAGNOSIS: Chronic renal insufficiencychronic renal insufficiency  POST-OPERATIVE DIAGNOSIS:  Same  PROCEDURE right first stage brachiobasilic fistularight first stage brachiobasilic fistula  SURGEON:  Curt Jews, M.D.  PHYSICIAN ASSISTANT: Liana Crocker, PA-C  ANESTHESIA: Local with sedation  EBL: per anesthesia record  Total I/O In: 500 [I.V.:500] Out: 10 [Blood:10]  BLOOD ADMINISTERED: none  DRAINS: none  SPECIMEN: none  COUNTS CORRECT:  YES  PATIENT DISPOSITION:  PACU - hemodynamically stable  PROCEDURE DETAILS: The patient was taken to the operating placed supine position where the area of the arm was prepped and draped you sterile fashion.  SonoSite ultrasound was used to visualize the basilic vein which was of good caliber.  Using local anesthesia incision was made over the antecubital space and carried down through subcutaneous tissue.  The brachial artery was identified and was of large caliber.  The basilic vein was identified to the same incision was also large caliber.  Tributary branches the basilic vein were ligated and divided.  Dissection was continued more proximally and there was a large branch traveling medially and this was doubly clipped.  This was to prevent flow through this branch for maturation of the first stage fistula.  The vein was ligated distally and divided and was mobilized to the level of the artery.  The vein was gently dilated with heparinized saline was excellent caliber.  The artery was occluded proximally distally and was opened with an 11 blade and sent mostly with Potts scissors.  The vein was spatulated and sewn into to the artery with a running 6-0 Prolene suture.  Clamps were removed and excellent thrill was noted.  The patient maintained a 2+ radial pulse.  The with saline.  Hemostasis talus cautery.   The wound was closed with 3-0 Vicryl in the subcutaneous and subcuticular tissue.  Sterile dressing was applied and the patient was transferred to the recovery room in stable condition   Rosetta Posner, M.D., [facility] 03/11/2018 8:51 AM

## 2018-03-11 NOTE — Discharge Instructions (Signed)
° °  [facility]  Discharge Instructions  AV Fistula or Graft Surgery for Dialysis Access  Please refer to the following instructions for your post-procedure care. Your surgeon or physician assistant will discuss any changes with you.  Activity  You may drive the day following your surgery, if you are comfortable and no longer taking prescription pain medication. Resume full activity as the soreness in your incision resolves.  Bathing/Showering  You may shower after you go home. Keep your incision dry for 48 hours. Do not soak in a bathtub, hot tub, or swim until the incision heals completely. You may not shower if you have a hemodialysis catheter.  Incision Care  Clean your incision with mild soap and water after 48 hours. Pat the area dry with a clean towel. You do not need a bandage unless otherwise instructed. Do not apply any ointments or creams to your incision. You may have skin glue on your incision. Do not peel it off. It will come off on its own in about one week. Your arm may swell a bit after surgery. To reduce swelling use pillows to elevate your arm so it is above your heart. Your doctor will tell you if you need to lightly wrap your arm with an ACE bandage.  Diet  Resume your normal diet. There are not special food restrictions following this procedure. In order to heal from your surgery, it is CRITICAL to get adequate nutrition. Your body requires vitamins, minerals, and protein. Vegetables are the best source of vitamins and minerals. Vegetables also provide the perfect balance of protein. Processed food has little nutritional value, so try to avoid this.  Medications  Resume taking all of your medications. If your incision is causing pain, you may take over-the counter pain relievers such as acetaminophen (Tylenol). If you were prescribed a stronger pain medication, please be aware these medications can cause nausea and constipation. Prevent  nausea by taking the medication with a snack or meal. Avoid constipation by drinking plenty of fluids and eating foods with high amount of fiber, such as fruits, vegetables, and grains.  Do not take Tylenol if you are taking prescription pain medications.  Follow up Your surgeon may want to see you in the office following your access surgery. If so, this will be arranged at the time of your surgery.  Please call us immediately for any of the following conditions:  Increased pain, redness, drainage (pus) from your incision site Fever of 101 degrees or higher Severe or worsening pain at your incision site Hand pain or numbness.  Reduce your risk of vascular disease:  Stop smoking. If you would like help, call [facility] at 1-800-QUIT-NOW (916) 732-6482) or [place] at [place] your cholesterol Maintain a desired weight Control your diabetes Keep your blood pressure down  Dialysis  It will take several weeks to several months for your new dialysis access to be ready for use. Your surgeon will determine when it is okay to use it. Your nephrologist will continue to direct your dialysis. You can continue to use your Permcath until your new access is ready for use.   03/11/2018 [name] [id number] June 13, 1950  Surgeon(s): Early, Arvilla Meres, MD  Procedure(s): Creation of 1st stage right basilic vein transposition.  x Do not stick fistula for 12 weeks    If you have any questions, please call the office at [phone].

## 2018-03-11 NOTE — Anesthesia Postprocedure Evaluation (Signed)
Anesthesia Post Note  Patient: [name]  Procedure(s) Performed: CREATION Brachiocephalic Fistula RIGHT ARM (Right Arm Upper)     Patient location during evaluation: PACU Anesthesia Type: MAC Level of consciousness: awake and alert Pain management: pain level controlled Vital Signs Assessment: post-procedure vital signs reviewed and stable Respiratory status: spontaneous breathing, nonlabored ventilation and respiratory function stable Cardiovascular status: blood pressure returned to baseline and stable Postop Assessment: no apparent nausea or vomiting Anesthetic complications: no    Last Vitals:  Vitals:   03/11/18 0925 03/11/18 0935  BP: 139/62 130/66  Pulse: 60 68  Resp: 15 16  Temp: (!) 36.4 C   SpO2: 98% 96%    Last Pain:  Vitals:   03/11/18 0925  PainSc: [place]

## 2018-03-12 ENCOUNTER — Encounter (HOSPITAL_COMMUNITY): Payer: Self-pay | Admitting: Vascular Surgery

## 2018-03-18 ENCOUNTER — Encounter (HOSPITAL_COMMUNITY)
Admission: EM | Disposition: A | Discharge status: Home / Self Care | Payer: Self-pay | Source: Home / Self Care | Attending: Vascular Surgery

## 2018-03-18 ENCOUNTER — Emergency Department (HOSPITAL_COMMUNITY): Payer: Medicare Other | Admitting: Anesthesiology

## 2018-03-18 ENCOUNTER — Encounter (HOSPITAL_COMMUNITY): Payer: Self-pay | Admitting: Emergency Medicine

## 2018-03-18 ENCOUNTER — Inpatient Hospital Stay (HOSPITAL_COMMUNITY)
Admission: EM | Admit: 2018-03-18 | Discharge: 2018-03-19 | DRG: 252 | Disposition: A | Discharge status: Home / Self Care | Payer: Medicare Other | Attending: Vascular Surgery | Admitting: Vascular Surgery

## 2018-03-18 DIAGNOSIS — Z950 Presence of cardiac pacemaker: Secondary | ICD-10-CM

## 2018-03-18 DIAGNOSIS — R202 Paresthesia of skin: Secondary | ICD-10-CM

## 2018-03-18 DIAGNOSIS — I2510 Atherosclerotic heart disease of native coronary artery without angina pectoris: Secondary | ICD-10-CM | POA: Diagnosis present

## 2018-03-18 DIAGNOSIS — I50810 Right heart failure, unspecified: Secondary | ICD-10-CM | POA: Diagnosis not present

## 2018-03-18 DIAGNOSIS — Z7901 Long term (current) use of anticoagulants: Secondary | ICD-10-CM | POA: Diagnosis not present

## 2018-03-18 DIAGNOSIS — Z8679 Personal history of other diseases of the circulatory system: Secondary | ICD-10-CM | POA: Diagnosis not present

## 2018-03-18 DIAGNOSIS — Z794 Long term (current) use of insulin: Secondary | ICD-10-CM

## 2018-03-18 DIAGNOSIS — E1122 Type 2 diabetes mellitus with diabetic chronic kidney disease: Secondary | ICD-10-CM | POA: Diagnosis present

## 2018-03-18 DIAGNOSIS — N186 End stage renal disease: Secondary | ICD-10-CM | POA: Diagnosis not present

## 2018-03-18 DIAGNOSIS — R402253 Coma scale, best verbal response, oriented, at hospital admission: Secondary | ICD-10-CM | POA: Diagnosis present

## 2018-03-18 DIAGNOSIS — R51 Headache: Secondary | ICD-10-CM | POA: Diagnosis present

## 2018-03-18 DIAGNOSIS — N183 Chronic kidney disease, stage 3 unspecified: Secondary | ICD-10-CM | POA: Diagnosis present

## 2018-03-18 DIAGNOSIS — N185 Chronic kidney disease, stage 5: Secondary | ICD-10-CM | POA: Diagnosis not present

## 2018-03-18 DIAGNOSIS — F17210 Nicotine dependence, cigarettes, uncomplicated: Secondary | ICD-10-CM | POA: Diagnosis present

## 2018-03-18 DIAGNOSIS — J449 Chronic obstructive pulmonary disease, unspecified: Secondary | ICD-10-CM | POA: Diagnosis not present

## 2018-03-18 DIAGNOSIS — I731 Thromboangiitis obliterans [Buerger's disease]: Secondary | ICD-10-CM | POA: Diagnosis present

## 2018-03-18 DIAGNOSIS — T829XXA Unspecified complication of cardiac and vascular prosthetic device, implant and graft, initial encounter: Principal | ICD-10-CM | POA: Diagnosis present

## 2018-03-18 DIAGNOSIS — R402363 Coma scale, best motor response, obeys commands, at hospital admission: Secondary | ICD-10-CM | POA: Diagnosis present

## 2018-03-18 DIAGNOSIS — Z7951 Long term (current) use of inhaled steroids: Secondary | ICD-10-CM

## 2018-03-18 DIAGNOSIS — Z955 Presence of coronary angioplasty implant and graft: Secondary | ICD-10-CM | POA: Diagnosis not present

## 2018-03-18 DIAGNOSIS — Y832 Surgical operation with anastomosis, bypass or graft as the cause of abnormal reaction of the patient, or of later complication, without mention of misadventure at the time of the procedure: Secondary | ICD-10-CM | POA: Diagnosis present

## 2018-03-18 DIAGNOSIS — E785 Hyperlipidemia, unspecified: Secondary | ICD-10-CM | POA: Diagnosis not present

## 2018-03-18 DIAGNOSIS — I872 Venous insufficiency (chronic) (peripheral): Secondary | ICD-10-CM | POA: Diagnosis present

## 2018-03-18 DIAGNOSIS — Z79899 Other long term (current) drug therapy: Secondary | ICD-10-CM

## 2018-03-18 DIAGNOSIS — R402143 Coma scale, eyes open, spontaneous, at hospital admission: Secondary | ICD-10-CM | POA: Diagnosis present

## 2018-03-18 DIAGNOSIS — T82590A Other mechanical complication of surgically created arteriovenous fistula, initial encounter: Secondary | ICD-10-CM | POA: Diagnosis not present

## 2018-03-18 DIAGNOSIS — G4730 Sleep apnea, unspecified: Secondary | ICD-10-CM | POA: Diagnosis not present

## 2018-03-18 DIAGNOSIS — E1151 Type 2 diabetes mellitus with diabetic peripheral angiopathy without gangrene: Secondary | ICD-10-CM | POA: Diagnosis present

## 2018-03-18 DIAGNOSIS — T82898A Other specified complication of vascular prosthetic devices, implants and grafts, initial encounter: Secondary | ICD-10-CM | POA: Diagnosis not present

## 2018-03-18 DIAGNOSIS — Z89412 Acquired absence of left great toe: Secondary | ICD-10-CM

## 2018-03-18 DIAGNOSIS — I132 Hypertensive heart and chronic kidney disease with heart failure and with stage 5 chronic kidney disease, or end stage renal disease: Secondary | ICD-10-CM | POA: Diagnosis present

## 2018-03-18 DIAGNOSIS — T82868A Thrombosis of vascular prosthetic devices, implants and grafts, initial encounter: Secondary | ICD-10-CM | POA: Diagnosis present

## 2018-03-18 DIAGNOSIS — I129 Hypertensive chronic kidney disease with stage 1 through stage 4 chronic kidney disease, or unspecified chronic kidney disease: Secondary | ICD-10-CM | POA: Diagnosis not present

## 2018-03-18 DIAGNOSIS — I4891 Unspecified atrial fibrillation: Secondary | ICD-10-CM | POA: Diagnosis not present

## 2018-03-18 HISTORY — PX: LIGATION ARTERIOVENOUS GORTEX GRAFT: SHX5947

## 2018-03-18 HISTORY — PX: INSERTION OF ARTERIOVENOUS (AV) ARTEGRAFT ARM: SHX6779

## 2018-03-18 LAB — COMPREHENSIVE METABOLIC PANEL
ALT: 13 U/L (ref 0–44)
AST: 21 U/L (ref 15–41)
Albumin: 3.5 g/dL (ref 3.5–5.0)
Alkaline Phosphatase: 61 U/L (ref 38–126)
Anion gap: 13 (ref 5–15)
BUN: 66 mg/dL — ABNORMAL HIGH (ref 8–23)
CO2: 22 mmol/L (ref 22–32)
Calcium: 9.1 mg/dL (ref 8.9–10.3)
Chloride: 102 mmol/L (ref 98–111)
Creatinine, Ser: 3.85 mg/dL — ABNORMAL HIGH (ref 0.61–1.24)
GFR calc Af Amer: 17 mL/min — ABNORMAL LOW (ref >60)
GFR calc non Af Amer: 15 mL/min — ABNORMAL LOW (ref >60)
Glucose, Bld: 92 mg/dL (ref 70–99)
Potassium: 3.3 mmol/L — ABNORMAL LOW (ref 3.5–5.1)
Sodium: 137 mmol/L (ref 135–145)
Total Bilirubin: 0.6 mg/dL (ref 0.3–1.2)
Total Protein: 7.7 g/dL (ref 6.5–8.1)

## 2018-03-18 LAB — PROTIME-INR
INR: 1.12
Prothrombin Time: 14.3 seconds (ref 11.4–15.2)

## 2018-03-18 LAB — CBC WITH DIFFERENTIAL/PLATELET
Abs Immature Granulocytes: 0.02 10*3/uL (ref 0.00–0.07)
Basophils Absolute: 0.1 10*3/uL (ref 0.0–0.1)
Basophils Relative: 1 %
Eosinophils Absolute: 0.2 10*3/uL (ref 0.0–0.5)
Eosinophils Relative: 2 %
HCT: 40.6 % (ref 39.0–52.0)
Hemoglobin: 12.9 g/dL — ABNORMAL LOW (ref 13.0–17.0)
Immature Granulocytes: 0 %
Lymphocytes Relative: 26 %
Lymphs Abs: 2.2 10*3/uL (ref 0.7–4.0)
MCH: 31.4 pg (ref 26.0–34.0)
MCHC: 31.8 g/dL (ref 30.0–36.0)
MCV: 98.8 fL (ref 80.0–100.0)
Monocytes Absolute: 0.8 10*3/uL (ref 0.1–1.0)
Monocytes Relative: 10 %
Neutro Abs: 5.1 10*3/uL (ref 1.7–7.7)
Neutrophils Relative %: 61 %
Platelets: 212 10*3/uL (ref 150–400)
RBC: 4.11 MIL/uL — ABNORMAL LOW (ref 4.22–5.81)
RDW: 14.6 % (ref 11.5–15.5)
WBC: 8.4 10*3/uL (ref 4.0–10.5)
nRBC: 0 % (ref 0.0–0.2)

## 2018-03-18 LAB — APTT: aPTT: 40 seconds — ABNORMAL HIGH (ref 24–36)

## 2018-03-18 LAB — GLUCOSE, CAPILLARY
Glucose-Capillary: 70 mg/dL (ref 70–99)
Glucose-Capillary: 69 mg/dL — ABNORMAL LOW (ref 70–99)
Glucose-Capillary: 109 mg/dL — ABNORMAL HIGH (ref 70–99)

## 2018-03-18 SURGERY — LIGATION ARTERIOVENOUS GORTEX GRAFT
Anesthesia: General | Site: Arm Lower | Laterality: Right

## 2018-03-18 MED ORDER — TORSEMIDE 20 MG PO TABS: 50.0000 mg | ORAL_TABLET | Freq: Two times a day (BID) | ORAL | Status: DC

## 2018-03-18 MED ORDER — METOPROLOL TARTRATE 5 MG/5ML IV SOLN: 2.0000 mg | INTRAVENOUS | Status: DC | PRN

## 2018-03-18 MED ORDER — DEXAMETHASONE SODIUM PHOSPHATE 4 MG/ML IJ SOLN
INTRAMUSCULAR | Status: DC | PRN
  Administered 2018-03-18: 10 mg via INTRAVENOUS

## 2018-03-18 MED ORDER — ONDANSETRON HCL 4 MG/2ML IJ SOLN
INTRAMUSCULAR | Status: DC | PRN
  Administered 2018-03-18: 4 mg via INTRAVENOUS

## 2018-03-18 MED ORDER — HYDRALAZINE HCL 50 MG PO TABS
100.0000 mg | ORAL_TABLET | Freq: Three times a day (TID) | ORAL | Status: DC
  Administered 2018-03-19: 100 mg via ORAL
  Filled 2018-03-18: qty 2

## 2018-03-18 MED ORDER — FENTANYL CITRATE (PF) 250 MCG/5ML IJ SOLN
INTRAMUSCULAR | Status: AC
  Filled 2018-03-18: qty 5

## 2018-03-18 MED ORDER — LINACLOTIDE 145 MCG PO CAPS
290.0000 ug | ORAL_CAPSULE | Freq: Every day | ORAL | Status: DC | PRN
  Filled 2018-03-18: qty 2

## 2018-03-18 MED ORDER — FENTANYL CITRATE (PF) 100 MCG/2ML IJ SOLN
INTRAMUSCULAR | Status: DC | PRN
  Administered 2018-03-18 (×3): 50 ug via INTRAVENOUS

## 2018-03-18 MED ORDER — VITAMIN D 1000 UNITS PO TABS: 2000.0000 [IU] | ORAL_TABLET | Freq: Every day | ORAL | Status: DC

## 2018-03-18 MED ORDER — HYDRALAZINE HCL 20 MG/ML IJ SOLN: 5.0000 mg | INTRAMUSCULAR | Status: DC | PRN

## 2018-03-18 MED ORDER — POTASSIUM CHLORIDE CRYS ER 20 MEQ PO TBCR: 20.0000 meq | EXTENDED_RELEASE_TABLET | Freq: Every day | ORAL | Status: DC

## 2018-03-18 MED ORDER — PROMETHAZINE HCL 25 MG/ML IJ SOLN
6.2500 mg | Freq: Once | INTRAMUSCULAR | Status: AC
  Administered 2018-03-18: 6.25 mg via INTRAVENOUS

## 2018-03-18 MED ORDER — DEXTROSE 50 % IV SOLN
INTRAVENOUS | Status: AC
  Administered 2018-03-18: 25 mL via INTRAVENOUS
  Filled 2018-03-18: qty 50

## 2018-03-18 MED ORDER — SODIUM CHLORIDE 0.9 % IV SOLN
INTRAVENOUS | Status: AC
  Filled 2018-03-18: qty 1.2

## 2018-03-18 MED ORDER — SODIUM CHLORIDE 0.9% FLUSH: 3.0000 mL | Freq: Two times a day (BID) | INTRAVENOUS | Status: DC

## 2018-03-18 MED ORDER — POLYETHYLENE GLYCOL 3350 17 G PO PACK: 17.0000 g | PACK | Freq: Every day | ORAL | Status: DC

## 2018-03-18 MED ORDER — ALUM & MAG HYDROXIDE-SIMETH 200-200-20 MG/5ML PO SUSP: 15.0000 mL | ORAL | Status: DC | PRN

## 2018-03-18 MED ORDER — SODIUM CHLORIDE 0.9 % IV SOLN: 250.0000 mL | INTRAVENOUS | Status: DC | PRN

## 2018-03-18 MED ORDER — SODIUM CHLORIDE 0.9 % IV SOLN
INTRAVENOUS | Status: DC | PRN
  Administered 2018-03-18: 30 ug/min via INTRAVENOUS

## 2018-03-18 MED ORDER — GUAIFENESIN-DM 100-10 MG/5ML PO SYRP: 15.0000 mL | ORAL_SOLUTION | ORAL | Status: DC | PRN

## 2018-03-18 MED ORDER — DEXTROSE 50 % IV SOLN
25.0000 mL | Freq: Once | INTRAVENOUS | Status: AC
  Administered 2018-03-18: 25 mL via INTRAVENOUS

## 2018-03-18 MED ORDER — SODIUM CHLORIDE 0.9 % IV SOLN
INTRAVENOUS | Status: DC
  Administered 2018-03-18: 20:00:00 via INTRAVENOUS

## 2018-03-18 MED ORDER — PHENOL 1.4 % MT LIQD: 1.0000 | OROMUCOSAL | Status: DC | PRN

## 2018-03-18 MED ORDER — APIXABAN 5 MG PO TABS: 5.0000 mg | ORAL_TABLET | Freq: Two times a day (BID) | ORAL | Status: DC

## 2018-03-18 MED ORDER — CEFAZOLIN SODIUM-DEXTROSE 2-4 GM/100ML-% IV SOLN
INTRAVENOUS | Status: AC
  Filled 2018-03-18: qty 100

## 2018-03-18 MED ORDER — HYDROMORPHONE HCL 1 MG/ML IJ SOLN
0.2500 mg | INTRAMUSCULAR | Status: DC | PRN
  Administered 2018-03-18: 0.5 mg via INTRAVENOUS

## 2018-03-18 MED ORDER — INSULIN GLARGINE 100 UNIT/ML ~~LOC~~ SOLN
35.0000 [IU] | Freq: Every day | SUBCUTANEOUS | Status: DC
  Filled 2018-03-18: qty 0.35

## 2018-03-18 MED ORDER — HYDROMORPHONE HCL 1 MG/ML IJ SOLN
INTRAMUSCULAR | Status: AC
  Filled 2018-03-18: qty 1

## 2018-03-18 MED ORDER — SILDENAFIL CITRATE 100 MG PO TABS: 100.0000 mg | ORAL_TABLET | ORAL | Status: DC | PRN

## 2018-03-18 MED ORDER — CEFAZOLIN SODIUM-DEXTROSE 2-3 GM-%(50ML) IV SOLR
INTRAVENOUS | Status: DC | PRN
  Administered 2018-03-18: 2 g via INTRAVENOUS

## 2018-03-18 MED ORDER — LABETALOL HCL 5 MG/ML IV SOLN: 10.0000 mg | INTRAVENOUS | Status: DC | PRN

## 2018-03-18 MED ORDER — 0.9 % SODIUM CHLORIDE (POUR BTL) OPTIME
TOPICAL | Status: DC | PRN
  Administered 2018-03-18: 1000 mL

## 2018-03-18 MED ORDER — ALBUTEROL SULFATE (2.5 MG/3ML) 0.083% IN NEBU: 3.0000 mL | INHALATION_SOLUTION | Freq: Four times a day (QID) | RESPIRATORY_TRACT | Status: DC | PRN

## 2018-03-18 MED ORDER — ADULT MULTIVITAMIN W/MINERALS CH: 1.0000 | ORAL_TABLET | Freq: Every day | ORAL | Status: DC

## 2018-03-18 MED ORDER — LIDOCAINE 2% (20 MG/ML) 5 ML SYRINGE
INTRAMUSCULAR | Status: DC | PRN
  Administered 2018-03-18: 60 mg via INTRAVENOUS

## 2018-03-18 MED ORDER — PROMETHAZINE HCL 25 MG/ML IJ SOLN
INTRAMUSCULAR | Status: AC
  Administered 2018-03-18: 6.25 mg via INTRAVENOUS
  Filled 2018-03-18: qty 1

## 2018-03-18 MED ORDER — POLYSACCHARIDE IRON COMPLEX 150 MG PO CAPS: 150.0000 mg | ORAL_CAPSULE | Freq: Every day | ORAL | Status: DC

## 2018-03-18 MED ORDER — OXYCODONE-ACETAMINOPHEN 5-325 MG PO TABS
1.0000 | ORAL_TABLET | ORAL | Status: DC | PRN
  Administered 2018-03-19 (×2): 1 via ORAL
  Filled 2018-03-18 (×3): qty 1

## 2018-03-18 MED ORDER — MELATONIN 3 MG PO TABS
9.0000 mg | ORAL_TABLET | Freq: Every day | ORAL | Status: DC
  Administered 2018-03-19: 9 mg via ORAL
  Filled 2018-03-18 (×2): qty 3

## 2018-03-18 MED ORDER — DILTIAZEM HCL 60 MG PO TABS: 30.0000 mg | ORAL_TABLET | Freq: Two times a day (BID) | ORAL | Status: DC | PRN

## 2018-03-18 MED ORDER — PROPOFOL 10 MG/ML IV BOLUS
INTRAVENOUS | Status: DC | PRN
  Administered 2018-03-18: 40 mg via INTRAVENOUS
  Administered 2018-03-18: 110 mg via INTRAVENOUS

## 2018-03-18 MED ORDER — SODIUM CHLORIDE 0.9% FLUSH: 3.0000 mL | INTRAVENOUS | Status: DC | PRN

## 2018-03-18 MED ORDER — PANTOPRAZOLE SODIUM 40 MG PO TBEC: 40.0000 mg | DELAYED_RELEASE_TABLET | Freq: Every day | ORAL | Status: DC

## 2018-03-18 MED ORDER — BUDESONIDE 0.25 MG/2ML IN SUSP
0.2500 mg | Freq: Two times a day (BID) | RESPIRATORY_TRACT | Status: DC
  Administered 2018-03-19: 0.25 mg via RESPIRATORY_TRACT
  Filled 2018-03-18: qty 2

## 2018-03-18 MED ORDER — CARVEDILOL 25 MG PO TABS: 25.0000 mg | ORAL_TABLET | Freq: Two times a day (BID) | ORAL | Status: DC

## 2018-03-18 MED ORDER — ROSUVASTATIN CALCIUM 10 MG PO TABS: 20.0000 mg | ORAL_TABLET | Freq: Every day | ORAL | Status: DC

## 2018-03-18 MED ORDER — ACETAMINOPHEN 325 MG PO TABS
975.0000 mg | ORAL_TABLET | Freq: Every morning | ORAL | Status: DC
  Filled 2018-03-18: qty 3

## 2018-03-18 MED ORDER — SODIUM CHLORIDE 0.9 % IV SOLN
INTRAVENOUS | Status: DC | PRN
  Administered 2018-03-18: 500 mL

## 2018-03-18 MED ORDER — ONDANSETRON HCL 4 MG/2ML IJ SOLN: 4.0000 mg | Freq: Four times a day (QID) | INTRAMUSCULAR | Status: DC | PRN

## 2018-03-18 SURGICAL SUPPLY — 29 items
ADH SKN CLS APL DERMABOND .7 (GAUZE/BANDAGES/DRESSINGS) ×6
CANISTER SUCT 3000ML PPV (MISCELLANEOUS) ×3 IMPLANT
CLIP LIGATING EXTRA MED SLVR (CLIP) ×3 IMPLANT
CLIP LIGATING EXTRA SM BLUE (MISCELLANEOUS) ×3 IMPLANT
COVER WAND RF STERILE (DRAPES) ×2 IMPLANT
DECANTER SPIKE VIAL GLASS SM (MISCELLANEOUS) ×2 IMPLANT
DERMABOND ADVANCED (GAUZE/BANDAGES/DRESSINGS) ×3
DERMABOND ADVANCED .7 DNX12 (GAUZE/BANDAGES/DRESSINGS) ×2 IMPLANT
DRAPE HALF SHEET 40X57 (DRAPES) ×1 IMPLANT
ELECT REM PT RETURN 9FT ADLT (ELECTROSURGICAL) ×3
ELECTRODE REM PT RTRN 9FT ADLT (ELECTROSURGICAL) ×2 IMPLANT
GLOVE SS BIOGEL STRL SZ 7.5 (GLOVE) ×2 IMPLANT
GLOVE SUPERSENSE BIOGEL SZ 7.5 (GLOVE) ×1
GOWN STRL REUS W/ TWL LRG LVL3 (GOWN DISPOSABLE) ×6 IMPLANT
GOWN STRL REUS W/TWL LRG LVL3 (GOWN DISPOSABLE) ×9
KIT BASIN OR (CUSTOM PROCEDURE TRAY) ×3 IMPLANT
KIT TURNOVER KIT B (KITS) ×3 IMPLANT
NS IRRIG 1000ML POUR BTL (IV SOLUTION) ×3 IMPLANT
PACK CV ACCESS (CUSTOM PROCEDURE TRAY) ×3 IMPLANT
PAD ARMBOARD 7.5X6 YLW CONV (MISCELLANEOUS) ×6 IMPLANT
STAPLER VISISTAT 35W (STAPLE) IMPLANT
SUT ETHILON 3 0 PS 1 (SUTURE) IMPLANT
SUT PROLENE 6 0 CC (SUTURE) ×2 IMPLANT
SUT SILK 0 TIES 10X30 (SUTURE) ×3 IMPLANT
SUT VIC AB 3-0 SH 27 (SUTURE) ×6
SUT VIC AB 3-0 SH 27X BRD (SUTURE) ×2 IMPLANT
TOWEL GREEN STERILE (TOWEL DISPOSABLE) ×3 IMPLANT
UNDERPAD 30X30 (UNDERPADS AND DIAPERS) ×4 IMPLANT
WATER STERILE IRR 1000ML POUR (IV SOLUTION) ×3 IMPLANT

## 2018-03-18 NOTE — Anesthesia Procedure Notes (Signed)
Procedure Name: LMA Insertion Date/Time: 03/18/2018 8:30 PM Performed by: Oletta Lamas, CRNA Pre-anesthesia Checklist: Patient identified, Emergency Drugs available, Suction available and Patient being monitored Patient Re-evaluated:Patient Re-evaluated prior to induction Oxygen Delivery Method: Circle System Utilized Preoxygenation: Pre-oxygenation with 100% oxygen Induction Type: IV induction Ventilation: Mask ventilation without difficulty LMA: LMA inserted LMA Size: 5.0 Number of attempts: 1 Placement Confirmation: positive ETCO2 Tube secured with: Tape Dental Injury: Teeth and Oropharynx as per pre-operative assessment

## 2018-03-18 NOTE — Op Note (Signed)
    OPERATIVE REPORT  DATE OF SURGERY: 03/18/2018  PATIENT: [name], 67 y.o. male MRN: [id number]  DOB: 1950/08/13  PRE-OPERATIVE DIAGNOSIS: Steal syndrome right brachiobasilic AV fistula  POST-OPERATIVE DIAGNOSIS:  Same  PROCEDURE: #1 ligation of right brachiobasilic fistula, #2 left radiocephalic AV fistula  SURGEON:  Curt Jews, M.D.  PHYSICIAN ASSISTANT: Nurse  ANESTHESIA: LMA  EBL: per anesthesia record  Total I/O In: 250 [I.V.:250] Out: 25 [Blood:25]  BLOOD ADMINISTERED: none  DRAINS: none  SPECIMEN: none  COUNTS CORRECT:  YES  PATIENT DISPOSITION:  PACU - hemodynamically stable  PROCEDURE DETAILS: The patient was taken to the operating placed supine position where the area of the right arm was prepped draped in usual sterile fashion.  Incision was reopened from 1 week ago and the basilic vein was ligated near the arterial anastomosis.  The patient had a palpable radial pulse prior to this and maintain this.  The wound was irrigated with saline and was closed with 3-0 Vicryl in the subcutaneous and subcuticular tissue.  Next the attention was turned to the left arm.  The arm was imaged with SonoSite ultrasound and the cephalic vein was of adequate size at the wrist.  It did branch just above the wrist.  Incision was made between the level of the radial artery and the cephalic vein.  The vein was of moderate size and was relatively thin-walled.  The vein was mobilized up to the branch point and the larger of these 2 branches was taken.  Tributary branches were ligated with 3-0 silk ties and divided.  The vein was ligated distally and divided.  The vein was brought into approximation of the radial artery.  The radial artery was of good size with an excellent pulse.  The artery was occluded proximally and distally with Serafin clamps and was opened with an 11 blade and sent lost 20 with Potts scissors.  The vein was cut to the appropriate length and was spatulated  and sewn end-to-side to the artery with a running 6-0 Prolene suture.  The vein was gently dilated with a saline through a vessel cannula prior to this.  Clamps were removed and excellent thrill was noted.  The wounds were irrigated with saline.  Hemostasis letter cautery.  The wounds were closed with 3-0 Vicryl in the subcutaneous and subcuticular tissue.  Sterile dressing was applied and the patient was transferred to the recovery room in stable condition   Rosetta Posner, M.D., [facility] 03/18/2018 10:10 PM

## 2018-03-18 NOTE — Anesthesia Preprocedure Evaluation (Addendum)
Anesthesia Evaluation  Patient identified by MRN, date of birth, ID band Patient awake    Reviewed: Allergy & Precautions, H&P , NPO status , Patient's Chart, lab work & pertinent test results, reviewed documented beta blocker date and time   Airway Mallampati: II  TM Distance: >3 FB Neck ROM: Full    Dental no notable dental hx. (+) Teeth Intact, Dental Advisory Given   Pulmonary asthma , COPD,  COPD inhaler, Current Smoker,    Pulmonary exam normal breath sounds clear to auscultation       Cardiovascular hypertension, Pt. on medications and Pt. on home beta blockers + CAD, + Cardiac Stents, + Peripheral Vascular Disease and +CHF  + pacemaker  Rhythm:Regular Rate:Normal     Neuro/Psych  Headaches, Anxiety negative psych ROS   GI/Hepatic negative GI ROS, Neg liver ROS, Medicated,  Endo/Other  diabetes, Insulin Dependent  Renal/GU Renal InsufficiencyRenal disease  negative genitourinary   Musculoskeletal   Abdominal   Peds  Hematology negative hematology ROS (+)   Anesthesia Other Findings   Reproductive/Obstetrics negative OB ROS                            Anesthesia Physical Anesthesia Plan  ASA: III  Anesthesia Plan: General   Post-op Pain Management:    Induction: Intravenous  PONV Risk Score and Plan: 2 and Ondansetron and Midazolam  Airway Management Planned: LMA  Additional Equipment:   Intra-op Plan:   Post-operative Plan: Extubation in OR  Informed Consent: I have reviewed the patients History and Physical, chart, labs and discussed the procedure including the risks, benefits and alternatives for the proposed anesthesia with the patient or authorized representative who has indicated his/her understanding and acceptance.   Dental advisory given  Plan Discussed with: CRNA  Anesthesia Plan Comments:         Anesthesia Quick Evaluation

## 2018-03-18 NOTE — ED Notes (Signed)
ED Provider at bedside. 

## 2018-03-18 NOTE — Transfer of Care (Signed)
Immediate Anesthesia Transfer of Care Note  Patient: [name]  Procedure(s) Performed: LIGATION ARTERIOVENOUS FISTULA (Right ) INSERTION OF ARTERIOVENOUS (AV) FISTULA (Left Arm Lower)  Patient Location: PACU  Anesthesia Type:General  Level of Consciousness: awake, alert , oriented and patient cooperative  Airway & Oxygen Therapy: Patient Spontanous Breathing  Post-op Assessment: Report given to RN and Post -op Vital signs reviewed and stable  Post vital signs: Reviewed and stable  Last Vitals:  Vitals Value Taken Time  BP 142/68 03/18/2018 10:12 PM  Temp    Pulse 77 03/18/2018 10:13 PM  Resp 29 03/18/2018 10:13 PM  SpO2 90 % 03/18/2018 10:13 PM  Vitals shown include unvalidated device data.  Last Pain:  Vitals:   03/18/18 1720  TempSrc: Oral  PainSc:          Complications: No apparent anesthesia complications

## 2018-03-18 NOTE — ED Triage Notes (Signed)
Pt with new R arm fistula placed on Wednesday; now having numbness, tingling, cold extremity; cap refill < 3 sec; Early vasc surg

## 2018-03-18 NOTE — Anesthesia Postprocedure Evaluation (Signed)
Anesthesia Post Note  Patient: [name]  Procedure(s) Performed: LIGATION ARTERIOVENOUS FISTULA (Right ) INSERTION OF ARTERIOVENOUS (AV) FISTULA (Left Arm Lower)     Patient location during evaluation: PACU Anesthesia Type: General Level of consciousness: awake and alert Pain management: pain level controlled Vital Signs Assessment: post-procedure vital signs reviewed and stable Respiratory status: spontaneous breathing, nonlabored ventilation, respiratory function stable and patient connected to nasal cannula oxygen Cardiovascular status: blood pressure returned to baseline and stable Postop Assessment: no apparent nausea or vomiting Anesthetic complications: no    Last Vitals:  Vitals:   03/18/18 2245 03/18/18 2300  BP: (!) 153/84 (!) 153/79  Pulse: 70 69  Resp: 18 (!) 21  Temp:  36.7 C  SpO2: 95% 97%    Last Pain:  Vitals:   03/18/18 2245  TempSrc:   PainSc: Asleep                 Thuy Atilano,W. EDMOND

## 2018-03-18 NOTE — ED Provider Notes (Signed)
[facility] Provider Note   CSN: [id number] Arrival date & time: 03/18/18  1706     History   Chief Complaint Chief Complaint  Patient presents with  . Numbness  . Tingling  . Cold Extremity    HPI ZEBASTIAN CARICO is a 67 y.o. male.  HPI  Patient is a 67 year old male with a history of CAD, atrial fibrillation on Eliquis, pacemaker in place, ESRD, peripheral vascular disease presenting for tingling and discoloration to his right hand.  Patient reports that he had a AV fistula placed 1 week ago, which has been healing well, however 24 hours ago, he began to have some decreased sensation in his thumb and first finger of the right hand.  He reports that he began having discoloration over the past 24 hours, and now feels paresthesias in all fingers of his right hand.  He reports that it looks more pale compared to his left hand.  Patient denies any chest pain or shortness of breath over this interval.  He denies any pain in the right upper extremity.  Eliquis was temporarily held for the procedure 1 week ago, but was resumed tonight after surgery.  Past Medical History:  Diagnosis Date  . Asthma   . Atrial flutter ([place])   . CAD (coronary artery disease)    a. s/p prior stenting of RCA in 1999 b. cath in 2003 showing moderate CAD c. NST in 2016 showing small area of ischemic and low-risk  . Chronic renal insufficiency   . COPD (chronic obstructive pulmonary disease) ([place])    with ongoing tobacco use and patient failed sham takes  . DM2 (diabetes mellitus, type 2) ([place])   . Dyslipidemia   . Edema    chronic lower extremity secondary to right heart failure and chronic venous insufficiency  . Headache   . HTN (hypertension)   . Morbid obesity ([place])   . Pneumonia   . Presence of permanent cardiac pacemaker   . 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   Marland Kitchen Reflux esophagitis    hx  .  Renal insufficiency   . Sleep apnea   . Two-vessel coronary artery disease    moderate. by cath in 2003. Status post stenting of the mdi RCA November 1999 normal left ventricular ejection fraction  . Wears glasses   . Wears hearing aid    B/L    Patient Active Problem List   Diagnosis Date Noted  . Diffuse wheezing 12/02/2017  . OSA and COPD overlap syndrome ([place]) 12/02/2017  . Sleeps in sitting position due to orthopnea 12/02/2017  . Pacemaker 12/02/2017  . Bradycardia with 31-40 beats per minute 12/02/2017  . Coronary artery disease due to calcified coronary lesion 12/02/2017  . Insomnia due to medical condition 12/02/2017  . Snoring 12/02/2017  . Chronic diastolic CHF (congestive heart failure) ([place]) 05/10/2017  . Atypical chest pain   . Chest pain 05/09/2017  . Atrial flutter ([place]) 05/09/2017  . Acute renal failure superimposed on stage 4 chronic kidney disease ([place]) 05/09/2017  . Hypokalemia 04/05/2016  . Diabetes mellitus with nephropathy ([place]) 04/05/2016  . Hyponatremia 04/05/2016  . Hematuria 04/05/2016  . Diarrhea 04/05/2016  . Bilateral diabetic foot ulcer associated with secondary diabetes mellitus ([place]) 04/05/2016  . Streptococcal bacteremia 04/05/2016  . Cellulitis of leg, left 04/05/2016  . Sepsis ([place]) 04/04/2016  . CAD (coronary artery disease) 11/10/2015  . Bilateral lower extremity edema  07/25/2015  . Foot infection 09/27/2014  . Diabetic foot infection ([place]) 09/27/2014  . CKD stage 3 due to type 2 diabetes mellitus ([place])   . Diabetes mellitus with renal manifestations, uncontrolled ([place])   . Benign essential HTN   . ED (erectile dysfunction) 06/09/2012  . Hypertension 03/11/2011  . OSTEOMYELITIS, ACUTE, ANKLE/FOOT 07/24/2010  . EDEMA 02/15/2010  . MORBID OBESITY 07/05/2009  . TOBACCO ABUSE 07/05/2009  . OBSTRUCTIVE SLEEP APNEA 07/05/2009  . COPD (chronic obstructive pulmonary disease) ([place]) 07/05/2009  . 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 HYPERLIPIDEMIA 03/30/2008    . Generalized anxiety disorder 03/30/2008  . [place] DISEASE 03/30/2008    Past Surgical History:  Procedure Laterality Date  . ABDOMINAL SURGERY    . APPENDECTOMY    . AV FISTULA PLACEMENT Right 03/11/2018   Procedure: CREATION Brachiocephalic Fistula RIGHT ARM;  Surgeon: Rosetta Posner, MD;  Location: [facility];  Service: Vascular;  Laterality: Right;  . BACK SURGERY    . CARDIAC CATHETERIZATION     stent  . CHOLECYSTECTOMY    . CLOSED REDUCTION SHOULDER DISLOCATION    . COLONOSCOPY W/ BIOPSIES AND POLYPECTOMY    . diabetic ulcers    . GROIN EXPLORATION    . OSTECTOMY Left 04/23/2017   Procedure: OSTECTOMY LEFT GREAT TOE;  Surgeon: [name], DPM;  Location: [facility];  Service: Podiatry;  Laterality: Left;  left great toe  . TUMOR REMOVAL     small intestine         Home Medications    Prior to Admission medications   Medication Sig Start Date End Date Taking? Authorizing Provider  acetaminophen (TYLENOL) 325 MG tablet Take 975 mg by mouth every morning.    Yes [provider]  albuterol (VENTOLIN HFA) 108 (90 BASE) MCG/ACT inhaler Inhale 2 puffs into the lungs every 6 (six) hours as needed for wheezing or shortness of breath.    Yes [provider]  carvedilol (COREG) 25 MG tablet Take 25 mg by mouth 2 (two) times daily with a meal.   Yes [provider]  Cholecalciferol (VITAMIN D3) 2000 units TABS Take 2,000 Units by mouth daily.    Yes [provider]  diltiazem (CARDIZEM) 30 MG tablet Take 1 tablet (30 mg total) by mouth 2 (two) times daily as needed (As needed for palpitations.). 09/04/17  Yes Strader, [place] M, PA-C  ELIQUIS 5 MG TABS tablet TAKE ONE TABLET BY MOUTH TWICE DAILY Patient taking differently: Take 5 mg by mouth 2 (two) times daily.  12/02/17  Yes Branch, Alphonse Guild, MD  hydrALAZINE (APRESOLINE) 100 MG tablet TAKE ONE TABLET BY MOUTH THREE TIMES DAILY Patient taking differently: Take 100 mg by mouth 3 (three) times  daily.  12/02/17  Yes BranchAlphonse Guild, MD  insulin glargine (LANTUS) 100 UNIT/ML injection Inject 35 Units daily after breakfast into the skin.    Yes [provider]  iron polysaccharides (NIFEREX) 150 MG capsule Take 150 mg by mouth daily.   Yes [provider]  LINZESS 290 MCG CAPS capsule Take 290 mcg by mouth daily as needed (for constipation).  07/26/15  Yes [provider]  Melatonin 10 MG TABS Take 10-30 mg by mouth at bedtime.    Yes [provider]  Multiple Vitamin (MULTIVITAMIN) tablet Take 1 tablet by mouth daily.     Yes [provider]  omeprazole (PRILOSEC) 20 MG capsule Take 20 mg daily by mouth.   Yes [provider]  polyethylene glycol (MIRALAX /  GLYCOLAX) packet Take 17 g by mouth every morning.   Yes [provider]  potassium chloride SA (K-DUR,KLOR-CON) 20 MEQ tablet Take 1 tablet (20 mEq total) by mouth daily. 05/10/17  Yes Tat, Shanon Brow, MD  QVAR REDIHALER 80 MCG/ACT inhaler Inhale 2 puffs into the lungs 2 (two) times daily. 10/06/17  Yes [provider]  rosuvastatin (CRESTOR) 20 MG tablet TAKE ONE TABLET BY MOUTH EVERY DAY Patient taking differently: Take 20 mg by mouth daily.  12/02/17  Yes BranchAlphonse Guild, MD  sildenafil (VIAGRA) 100 MG tablet Take 1 tablet (100 mg total) by mouth as needed for erectile dysfunction. 01/23/18  Yes Allred, Jeneen Rinks, MD  silver sulfADIAZINE (SILVADENE) 1 % cream Apply 1 application topically daily as needed (For wound care with dressing).    Yes [provider]  torsemide (DEMADEX) 100 MG tablet Take 50 mg by mouth 2 (two) times daily. Changed by Dr. Lowanda Foster this March. 10/06/17  Yes [provider]  oxyCODONE-acetaminophen (PERCOCET) 5-325 MG tablet Take 1 tablet by mouth every 6 (six) hours as needed for severe pain. Patient not taking: Reported on 03/18/2018 03/11/18   Gabriel Earing, PA-C    Family History Family History  Problem Relation Age of  Onset  . Diabetes Mother   . Alcohol abuse Father     Social History Social History   Tobacco Use  . Smoking status: Current Every Day Smoker    Packs/day: 1.00    Years: 40.00    Pack years: 40.00    Types: Cigarettes    Start date: 05/20/1968  . Smokeless tobacco: Never Used  Substance Use Topics  . Alcohol use: No    Alcohol/week: 0.0 standard drinks  . Drug use: No     Allergies   Patient has no known allergies.   Review of Systems Review of Systems  Constitutional: Negative for chills and fever.  HENT: Negative for congestion and sore throat.   Respiratory: Negative for cough, chest tightness and shortness of breath.   Cardiovascular: Negative for chest pain and leg swelling.  Gastrointestinal: Negative for abdominal pain, nausea and vomiting.  Musculoskeletal: Negative for back pain and myalgias.  Skin: Positive for color change. Negative for rash.  Neurological: Positive for numbness. Negative for dizziness and syncope.  All other systems reviewed and are negative.    Physical Exam Updated Vital Signs BP (!) 194/75   Pulse (!) 56   Temp 97.7 F (36.5 C) (Oral)   Ht 6\' 4"  (1.93 m)   Wt 108.9 kg   SpO2 97%   BMI 29.21 kg/m   Physical Exam  Constitutional: He appears well-developed and well-nourished. No distress.  HENT:  Head: Normocephalic and atraumatic.  Mouth/Throat: Oropharynx is clear and moist.  Eyes: Pupils are equal, round, and reactive to light. Conjunctivae and EOM are normal.  Neck: Normal range of motion. Neck supple.  Cardiovascular: Regular rhythm, S1 normal and S2 normal.  No murmur heard. Bradycardia noted.  Pulmonary/Chest: Effort normal and breath sounds normal. He has no wheezes. He has no rales.  Abdominal: Soft. He exhibits no distension. There is no tenderness. There is no guarding.  Musculoskeletal:  Right hand exhibits slightly pale discoloration compared to left upper extremity.  Capillary refill is approximately 5 seconds  in all fingers of the right hand.  Patient has intact, 1+ radial and ulnar pulse of the right upper extremity.  Normal grip strength of right hand. In the right antecubital fossa, patient has a well-healing  AV fistula surgical site.  Palpable thrill.  Neurological: He is alert.  Cranial nerves grossly intact. Patient moves extremities symmetrically and with good coordination.  Skin: Skin is warm and dry. No rash noted. No erythema.  Psychiatric: He has a normal mood and affect. His behavior is normal. Judgment and thought content normal.  Nursing note and vitals reviewed.    ED Treatments / Results  Labs (all labs ordered are listed, but only abnormal results are displayed) Labs Reviewed  COMPREHENSIVE METABOLIC PANEL - Abnormal; Notable for the following components:      Result Value   Potassium 3.3 (*)    BUN 66 (*)    Creatinine, Ser 3.85 (*)    GFR calc non Af Amer 15 (*)    GFR calc Af Amer 17 (*)    All other components within normal limits  CBC WITH DIFFERENTIAL/PLATELET - Abnormal; Notable for the following components:   RBC 4.11 (*)    Hemoglobin 12.9 (*)    All other components within normal limits  APTT - Abnormal; Notable for the following components:   aPTT 40 (*)    All other components within normal limits  PROTIME-INR  GLUCOSE, CAPILLARY    EKG EKG Interpretation  Date/Time:  Wednesday March 18 2018 17:57:11 EDT Ventricular Rate:  67 PR Interval:    QRS Duration: 108 QT Interval:  418 QTC Calculation: 421 R Axis:   26 Text Interpretation:  A-V dual-paced complexes w/ some inhibition No further analysis attempted due to paced rhythm Baseline wander in lead(s) III aVF Confirmed by Pattricia Boss [phone]) on 03/18/2018 9:56:07 PM   Radiology No results found.  Procedures Procedures (including critical care time)  Medications Ordered in ED Medications - No data to display   Initial Impression / Assessment and Plan / ED Course  I have reviewed the  triage vital signs and the nursing notes.  Pertinent labs & imaging results that were available during my care of the patient were reviewed by me and considered in my medical decision making (see chart for details).  Clinical Course as of Mar 18 2149  Wed Mar 18, 2018  1846 Dr. Donnetta Hutching to come by and see.    [AM]    Clinical Course User Index [AM] Albesa Seen, PA-C    Page placed to vascular surgery to discuss case.  Differential diagnosis includes arterial occlusion versus vascular steal syndrome.  Given that pulses are palpable, steal syndrome more likely.  Patient was evaluated at bedside by myself and Dr. Pattricia Boss.  Bedside Doppler revealed normal pulse waves of radial and ulnar pulse of the right upper extremity.  Patient was evaluated by Dr. Donnetta Hutching of vascular surgery.  Patient taken to the operating room for reversal of his AV fistula graft. 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   This is a shared visit with Dr. Pattricia Boss. Patient was independently evaluated by this attending physician. Attending physician consulted in evaluation and management.  Final Clinical Impressions(s) / ED Diagnoses   Final diagnoses:  Paresthesias  Complication of arteriovenous dialysis fistula, initial encounter    ED Discharge Orders    None       Tamala Julian 03/18/18 2200    Pattricia Boss, MD 03/20/18 1345

## 2018-03-18 NOTE — H&P (Signed)
   Patient name: [name] MRN: [id number] DOB: [birth year] Sex: male    HPI: [name] is a 67 y.o. male presents to the emergency room with cyanosis and numbness in his right hand.  He has chronic renal insufficiency and underwent a first stage right brachiobasilic fistula 1 week ago as an outpatient.  He reports that over the past 2 days he has been having cyanosis and occasional blanching of his hand.  He has had progressive numbness and is now having persistent numbness in his hand.  He is right-handed.  He does have a left transvenous pacemaker and and the decision made was to proceed with right arm access first due to the potential for left subclavian occlusive disease.  Current Facility-Administered Medications  Medication Dose Route Frequency Provider Last Rate Last Dose  . 0.9 %  sodium chloride infusion   Intravenous Continuous Albert Palau, MD      . 0.9 % irrigation (POUR BTL)    PRN Albert Posner, MD   1,000 mL at 03/18/18 1956  . heparin 6,000 Units in sodium chloride 0.9 % 500 mL irrigation    PRN Albert Posner, MD   500 mL at 03/18/18 1957     PHYSICAL EXAM: Vitals:   03/18/18 1720 03/18/18 1726 03/18/18 1730 03/18/18 1915  BP: (!) 213/73 (!) 192/119 (!) 194/75 (!) 177/84  Pulse: 60 61 (!) 56 73  Resp:    17  Temp: 97.7 F (36.5 C)     TempSrc: Oral     SpO2:  99% 97% 97%  Weight:      Height:        GENERAL: The patient is a well-nourished male, in no acute distress. The vital signs are documented above. He does have an easily palpable right radial pulse.  He has radial and ulnar Doppler signal and also signal in his palmar arch.  He does have mild cyanosis but does have numbness in his entire hand.  MEDICAL ISSUES: I had discussed this at length with the patient and his wife present.  He is clearly exhibiting an [name] significant steal.  I have recommended ligation of his fistula.  He is very frustrated that this  is his first access attempt and is had a significant complication with steal.  I did discuss other options.  I do not feel it would be appropriate to proceed with aDRIL procedure since he is not even had his transposition.  He does not have any left arm swelling and was avoiding this simply because he has a history of pacemaker.  I explained that he may well have no evidence of venous stenosis.  I explained that he could undergo a venogram but this would put his current renal function at risk.  I have offered to place a left arm AV fistula tonight at the same time as we are ligating his right arm fistula.  Understands that he could potentially have steal on the left and also could have arm swelling if he indeed has central venous stenosis or occlusion that is not known about.  He wishes to proceed with this tonight as well.   Albert Posner, MD FACS [facility] Tel [phone] Pager [phone]

## 2018-03-19 ENCOUNTER — Other Ambulatory Visit: Payer: Self-pay

## 2018-03-19 ENCOUNTER — Encounter (HOSPITAL_COMMUNITY): Payer: Self-pay | Admitting: Vascular Surgery

## 2018-03-19 DIAGNOSIS — I731 Thromboangiitis obliterans [Buerger's disease]: Secondary | ICD-10-CM | POA: Diagnosis not present

## 2018-03-19 DIAGNOSIS — I50810 Right heart failure, unspecified: Secondary | ICD-10-CM | POA: Diagnosis not present

## 2018-03-19 DIAGNOSIS — T829XXA Unspecified complication of cardiac and vascular prosthetic device, implant and graft, initial encounter: Secondary | ICD-10-CM | POA: Diagnosis not present

## 2018-03-19 DIAGNOSIS — I132 Hypertensive heart and chronic kidney disease with heart failure and with stage 5 chronic kidney disease, or end stage renal disease: Secondary | ICD-10-CM | POA: Diagnosis not present

## 2018-03-19 DIAGNOSIS — E1151 Type 2 diabetes mellitus with diabetic peripheral angiopathy without gangrene: Secondary | ICD-10-CM | POA: Diagnosis not present

## 2018-03-19 DIAGNOSIS — N186 End stage renal disease: Secondary | ICD-10-CM | POA: Diagnosis not present

## 2018-03-19 LAB — COMPREHENSIVE METABOLIC PANEL
ALT: 9 U/L (ref 0–44)
AST: 17 U/L (ref 15–41)
Albumin: 2.7 g/dL — ABNORMAL LOW (ref 3.5–5.0)
Alkaline Phosphatase: 45 U/L (ref 38–126)
Anion gap: 7 (ref 5–15)
BUN: 61 mg/dL — ABNORMAL HIGH (ref 8–23)
CO2: 27 mmol/L (ref 22–32)
Calcium: 8.3 mg/dL — ABNORMAL LOW (ref 8.9–10.3)
Chloride: 106 mmol/L (ref 98–111)
Creatinine, Ser: 3.73 mg/dL — ABNORMAL HIGH (ref 0.61–1.24)
GFR calc Af Amer: 18 mL/min — ABNORMAL LOW (ref >60)
GFR calc non Af Amer: 16 mL/min — ABNORMAL LOW (ref >60)
Glucose, Bld: 78 mg/dL (ref 70–99)
Potassium: 3.2 mmol/L — ABNORMAL LOW (ref 3.5–5.1)
Sodium: 140 mmol/L (ref 135–145)
Total Bilirubin: 0.6 mg/dL (ref 0.3–1.2)
Total Protein: 6.1 g/dL — ABNORMAL LOW (ref 6.5–8.1)

## 2018-03-19 LAB — PROTIME-INR
INR: 1.11
Prothrombin Time: 14.2 seconds (ref 11.4–15.2)

## 2018-03-19 LAB — CBC
HCT: 33.9 % — ABNORMAL LOW (ref 39.0–52.0)
Hemoglobin: 10.9 g/dL — ABNORMAL LOW (ref 13.0–17.0)
MCH: 31.1 pg (ref 26.0–34.0)
MCHC: 32.2 g/dL (ref 30.0–36.0)
MCV: 96.9 fL (ref 80.0–100.0)
Platelets: 192 10*3/uL (ref 150–400)
RBC: 3.5 MIL/uL — ABNORMAL LOW (ref 4.22–5.81)
RDW: 14.6 % (ref 11.5–15.5)
WBC: 6.9 10*3/uL (ref 4.0–10.5)
nRBC: 0 % (ref 0.0–0.2)

## 2018-03-19 LAB — HIV ANTIBODY (ROUTINE TESTING W REFLEX): HIV Screen 4th Generation wRfx: NONREACTIVE

## 2018-03-19 MED ORDER — OXYCODONE-ACETAMINOPHEN 5-325 MG PO TABS: 1.0000 | ORAL_TABLET | Freq: Four times a day (QID) | ORAL | 0 refills | Status: DC | PRN

## 2018-03-19 NOTE — Discharge Instructions (Signed)
° °  [facility]  Discharge Instructions  AV Fistula or Graft Surgery for Dialysis Access  Please refer to the following instructions for your post-procedure care. Your surgeon or physician assistant will discuss any changes with you.  Activity  You may drive the day following your surgery, if you are comfortable and no longer taking prescription pain medication. Resume full activity as the soreness in your incision resolves.  Bathing/Showering  You may shower after you go home. Keep your incision dry for 48 hours. Do not soak in a bathtub, hot tub, or swim until the incision heals completely. You may not shower if you have a hemodialysis catheter.  Incision Care  Clean your incision with mild soap and water after 48 hours. Pat the area dry with a clean towel. You do not need a bandage unless otherwise instructed. Do not apply any ointments or creams to your incision. You may have skin glue on your incision. Do not peel it off. It will come off on its own in about one week. Your arm may swell a bit after surgery. To reduce swelling use pillows to elevate your arm so it is above your heart. Your doctor will tell you if you need to lightly wrap your arm with an ACE bandage.  Diet  Resume your normal diet. There are not special food restrictions following this procedure. In order to heal from your surgery, it is CRITICAL to get adequate nutrition. Your body requires vitamins, minerals, and protein. Vegetables are the best source of vitamins and minerals. Vegetables also provide the perfect balance of protein. Processed food has little nutritional value, so try to avoid this.  Medications  Resume taking all of your medications. If your incision is causing pain, you may take over-the counter pain relievers such as acetaminophen (Tylenol). If you were prescribed a stronger pain medication, please be aware these medications can cause nausea and constipation. Prevent  nausea by taking the medication with a snack or meal. Avoid constipation by drinking plenty of fluids and eating foods with high amount of fiber, such as fruits, vegetables, and grains.  Do not take Tylenol if you are taking prescription pain medications.  Follow up Your surgeon may want to see you in the office following your access surgery. If so, this will be arranged at the time of your surgery.  Please call us immediately for any of the following conditions:  Increased pain, redness, drainage (pus) from your incision site Fever of 101 degrees or higher Severe or worsening pain at your incision site Hand pain or numbness.  Reduce your risk of vascular disease:  Stop smoking. If you would like help, call [facility] at 1-800-QUIT-NOW 5805594963) or [place] at [place] your cholesterol Maintain a desired weight Control your diabetes Keep your blood pressure down  Dialysis  It will take several weeks to several months for your new dialysis access to be ready for use. Your surgeon will determine when it is okay to use it. Your nephrologist will continue to direct your dialysis. You can continue to use your Permcath until your new access is ready for use.   03/19/2018 [name] [id number] [birth year]  Surgeon(s): Early, Arvilla Meres, MD  Procedure(s): Ligation of right arm fistula Creation of left radial cephalic AV fistula  x Do not stick fistula for 12 weeks    If you have any questions, please call the office at [phone].

## 2018-03-19 NOTE — Progress Notes (Signed)
Wasted one percocet 5/325. Pulled to give to pt prior to discharge and pt left prior to getting pill. AD Alma Friendly witnessed waste in [place]. Payton Emerald, RN

## 2018-03-19 NOTE — Discharge Summary (Signed)
Discharge Summary    [name] 1950/12/04 67 y.o. male  [id number]  Admission Date: 03/18/2018  Discharge Date: 03/19/18  Physician: Rosetta Posner, MD  Admission Diagnosis: Loss of feeling in hand post surgery   HPI:   This is a 67 y.o. male presents to the emergency room with cyanosis and numbness in his right hand.  He has chronic renal insufficiency and underwent a first stage right brachiobasilic fistula 1 week ago as an outpatient.  He reports that over the past 2 days he has been having cyanosis and occasional blanching of his hand.  He has had progressive numbness and is now having persistent numbness in his hand.  He is right-handed.  He does have a left transvenous pacemaker and and the decision made was to proceed with right arm access first due to the potential for left subclavian occlusive disease.  Hospital Course:  The patient was admitted to the hospital and taken to the operating room on 03/18/2018 and underwent: #1 ligation of right brachiobasilic fistula, #2 left radiocephalic AV fistula    The pt tolerated the procedure well and was transported to the PACU in good condition.   By POD 1,  -pt doing much better this am-right hand improved -left RC AVF with excellent thrill; no evidence of steal  The remainder of the hospital course consisted of increasing mobilization and increasing intake of solids without difficulty.  CBC    Component Value Date/Time   WBC 6.9 03/19/2018 0013   RBC 3.50 (L) 03/19/2018 0013   HGB 10.9 (L) 03/19/2018 0013   HCT 33.9 (L) 03/19/2018 0013   PLT 192 03/19/2018 0013   MCV 96.9 03/19/2018 0013   MCH 31.1 03/19/2018 0013   MCHC 32.2 03/19/2018 0013   RDW 14.6 03/19/2018 0013   LYMPHSABS 2.2 03/18/2018 1745   MONOABS 0.8 03/18/2018 1745   EOSABS 0.2 03/18/2018 1745   BASOSABS 0.1 03/18/2018 1745    BMET    Component Value Date/Time   NA 140 03/19/2018 0013   NA 142 08/06/2017 1413   K 3.2 (L) 03/19/2018 0013     CL 106 03/19/2018 0013   CO2 27 03/19/2018 0013   GLUCOSE 78 03/19/2018 0013   BUN 61 (H) 03/19/2018 0013   BUN 55 (H) 08/06/2017 1413   CREATININE 3.73 (H) 03/19/2018 0013   CREATININE 2.01 (H) 07/28/2015 1606   CALCIUM 8.3 (L) 03/19/2018 0013   GFRNONAA 16 (L) 03/19/2018 0013   GFRAA 18 (L) 03/19/2018 0013        Discharge Diagnosis:  Loss of feeling in hand post surgery  Secondary Diagnosis: Patient Active Problem List   Diagnosis Date Noted  . 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[place]) 03/18/2018  . Chronic renal insufficiency, stage 3 (moderate) ([place]) 03/18/2018  . Diffuse wheezing 12/02/2017  . OSA and COPD overlap syndrome ([place]) 12/02/2017  . Sleeps in sitting position due to orthopnea 12/02/2017  . Pacemaker 12/02/2017  . Bradycardia with 31-40 beats per minute 12/02/2017  . Coronary artery disease due to calcified coronary lesion 12/02/2017  . Insomnia due to medical condition 12/02/2017  . Snoring 12/02/2017  . Chronic diastolic CHF (congestive heart failure) ([place]) 05/10/2017  . Atypical chest pain   . Chest pain 05/09/2017  . Atrial flutter ([place]) 05/09/2017  . Acute renal failure superimposed on stage 4 chronic kidney disease ([place]) 05/09/2017  . Hypokalemia 04/05/2016  . Diabetes mellitus with nephropathy ([place]) 04/05/2016  . Hyponatremia 04/05/2016  . Hematuria 04/05/2016  .  Diarrhea 04/05/2016  . Bilateral diabetic foot ulcer associated with secondary diabetes mellitus ([place]) 04/05/2016  . Streptococcal bacteremia 04/05/2016  . Cellulitis of leg, left 04/05/2016  . Sepsis ([place]) 04/04/2016  . CAD (coronary artery disease) 11/10/2015  . Bilateral lower extremity edema 07/25/2015  . Foot infection 09/27/2014  . Diabetic foot infection ([place]) 09/27/2014  . CKD stage 3 due to type 2 diabetes mellitus ([place])   . Diabetes mellitus with renal manifestations, uncontrolled ([place])   . Benign essential HTN   . ED (erectile dysfunction) 06/09/2012  . Hypertension  03/11/2011  . OSTEOMYELITIS, ACUTE, ANKLE/FOOT 07/24/2010  . EDEMA 02/15/2010  . MORBID OBESITY 07/05/2009  . TOBACCO ABUSE 07/05/2009  . OBSTRUCTIVE SLEEP APNEA 07/05/2009  . COPD (chronic obstructive pulmonary disease) ([place]) 07/05/2009  . 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 HYPERLIPIDEMIA 03/30/2008  . Generalized anxiety disorder 03/30/2008  . [place] DISEASE 03/30/2008   Past Medical History:  Diagnosis Date  . Asthma   . Atrial flutter ([place])   . CAD (coronary artery disease)    a. s/p prior stenting of RCA in 1999 b. cath in 2003 showing moderate CAD c. NST in 2016 showing small area of ischemic and low-risk  . Chronic renal insufficiency   . COPD (chronic obstructive pulmonary disease) ([place])    with ongoing tobacco use and patient failed sham takes  . DM2 (diabetes mellitus, type 2) ([place])   . Dyslipidemia   . Edema    chronic lower extremity secondary to right heart failure and chronic venous insufficiency  . Headache   . HTN (hypertension)   . Morbid obesity ([place])   . Pneumonia   . Presence of permanent cardiac pacemaker   . 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   Marland Kitchen Reflux esophagitis    hx  . Renal insufficiency   . Sleep apnea   . Two-vessel coronary artery disease    moderate. by cath in 2003. Status post stenting of the mdi RCA November 1999 normal left ventricular ejection fraction  . Wears glasses   . Wears hearing aid    B/L     Allergies as of 03/19/2018   No Known Allergies     Medication List    TAKE these medications   acetaminophen 325 MG tablet Commonly known as:  TYLENOL Take 975 mg by mouth every morning.   carvedilol 25 MG tablet Commonly known as:  COREG Take 25 mg by mouth 2 (two) times daily with a meal.   diltiazem 30 MG tablet Commonly known as:  CARDIZEM Take 1 tablet (30 mg total) by mouth 2 (two) times daily as needed (As needed for palpitations.).   ELIQUIS 5 MG Tabs  tablet Generic drug:  apixaban TAKE ONE TABLET BY MOUTH TWICE DAILY What changed:  how much to take   hydrALAZINE 100 MG tablet Commonly known as:  APRESOLINE TAKE ONE TABLET BY MOUTH THREE TIMES DAILY   iron polysaccharides 150 MG capsule Commonly known as:  NIFEREX Take 150 mg by mouth daily.   LANTUS 100 UNIT/ML injection Generic drug:  insulin glargine Inject 35 Units daily after breakfast into the skin.   LINZESS 290 MCG Caps capsule Generic drug:  linaclotide Take 290 mcg by mouth daily as needed (for constipation).   Melatonin 10 MG Tabs Take 10-30 mg by mouth at bedtime.   multivitamin tablet Take 1 tablet by mouth daily.   omeprazole 20 MG capsule Commonly known as:  PRILOSEC  Take 20 mg daily by mouth.   oxyCODONE-acetaminophen 5-325 MG tablet Commonly known as:  PERCOCET/ROXICET Take 1 tablet by mouth every 6 (six) hours as needed for severe pain.   polyethylene glycol packet Commonly known as:  MIRALAX / GLYCOLAX Take 17 g by mouth every morning.   potassium chloride SA 20 MEQ tablet Commonly known as:  K-DUR,KLOR-CON Take 1 tablet (20 mEq total) by mouth daily.   QVAR REDIHALER 80 MCG/ACT inhaler Generic drug:  beclomethasone Inhale 2 puffs into the lungs 2 (two) times daily.   rosuvastatin 20 MG tablet Commonly known as:  CRESTOR TAKE ONE TABLET BY MOUTH EVERY DAY   sildenafil 100 MG tablet Commonly known as:  VIAGRA Take 1 tablet (100 mg total) by mouth as needed for erectile dysfunction.   silver sulfADIAZINE 1 % cream Commonly known as:  SILVADENE Apply 1 application topically daily as needed (For wound care with dressing).   torsemide 100 MG tablet Commonly known as:  DEMADEX Take 50 mg by mouth 2 (two) times daily. Changed by Dr. Lowanda Foster this March.   VENTOLIN HFA 108 (90 Base) MCG/ACT inhaler Generic drug:  albuterol Inhale 2 puffs into the lungs every 6 (six) hours as needed for wheezing or shortness of breath.   Vitamin D3 2000  units Tabs Take 2,000 Units by mouth daily.       Prescriptions given: Roxicet #8 No Refill  Instructions:   [facility]  Discharge Instructions  AV Fistula or Graft Surgery for Dialysis Access  Please refer to the following instructions for your post-procedure care. Your surgeon or physician assistant will discuss any changes with you.  Activity  You may drive the day following your surgery, if you are comfortable and no longer taking prescription pain medication. Resume full activity as the soreness in your incision resolves.  Bathing/Showering  You may shower after you go home. Keep your incision dry for 48 hours. Do not soak in a bathtub, hot tub, or swim until the incision heals completely. You may not shower if you have a hemodialysis catheter.  Incision Care  Clean your incision with mild soap and water after 48 hours. Pat the area dry with a clean towel. You do not need a bandage unless otherwise instructed. Do not apply any ointments or creams to your incision. You may have skin glue on your incision. Do not peel it off. It will come off on its own in about one week. Your arm may swell a bit after surgery. To reduce swelling use pillows to elevate your arm so it is above your heart. Your doctor will tell you if you need to lightly wrap your arm with an ACE bandage.  Diet  Resume your normal diet. There are not special food restrictions following this procedure. In order to heal from your surgery, it is CRITICAL to get adequate nutrition. Your body requires vitamins, minerals, and protein. Vegetables are the best source of vitamins and minerals. Vegetables also provide the perfect balance of protein. Processed food has little nutritional value, so try to avoid this.  Medications  Resume taking all of your medications. If your incision is causing pain, you may take over-the counter pain relievers such as acetaminophen (Tylenol). If you were  prescribed a stronger pain medication, please be aware these medications can cause nausea and constipation. Prevent nausea by taking the medication with a snack or meal. Avoid constipation by drinking plenty of fluids and eating foods with high amount  of fiber, such as fruits, vegetables, and grains.  Do not take Tylenol if you are taking prescription pain medications.  Follow up Your surgeon may want to see you in the office following your access surgery. If so, this will be arranged at the time of your surgery.  Please call us immediately for any of the following conditions:  Increased pain, redness, drainage (pus) from your incision site Fever of 101 degrees or higher Severe or worsening pain at your incision site Hand pain or numbness.  Reduce your risk of vascular disease:  Stop smoking. If you would like help, call [facility] at 1-800-QUIT-NOW 380-242-9659) or [place] at [place] your cholesterol Maintain a desired weight Control your diabetes Keep your blood pressure down  Dialysis  It will take several weeks to several months for your new dialysis access to be ready for use. Your surgeon will determine when it is okay to use it. Your nephrologist will continue to direct your dialysis. You can continue to use your Permcath until your new access is ready for use.   03/19/2018 GIBRIL MASTRO [id number] 1950-10-18  Surgeon(s): Early, Arvilla Meres, MD  Procedure(s): #1 ligation of right brachiobasilic fistula, #2 left radiocephalic AV fistula   x Do not stick fistula for 12 weeks    If you have any questions, please call the office at [phone].    Patient's condition: is Good  Follow up: 1. Dr. Donnetta Hutching on 04/20/18 @ [place], PA-C [facility] [phone] 03/19/2018  7:46 AM

## 2018-03-19 NOTE — Progress Notes (Signed)
  Progress Note    03/19/2018 7:38 AM 1 Day Post-Op  Subjective:  Says his right hand feels better; right incision more sore than left  Afebrile VSS 97% 4LO2NC  Vitals:   03/19/18 0422 03/19/18 0433  BP: (!) 146/83   Pulse: 66 (!) 25  Resp: (!) 21 (!) 24  Temp: 97.7 F (36.5 C)   SpO2: 97% 97%    Physical Exam: Cardiac:  Regular Lungs:  Non labored Incisions:  Right and left arm incisions look good Extremities:  Excellent thrill within fistula; hand grips are equal bilaterally; motor and sensation in tact bilateral hands   CBC    Component Value Date/Time   WBC 6.9 03/19/2018 0013   RBC 3.50 (L) 03/19/2018 0013   HGB 10.9 (L) 03/19/2018 0013   HCT 33.9 (L) 03/19/2018 0013   PLT 192 03/19/2018 0013   MCV 96.9 03/19/2018 0013   MCH 31.1 03/19/2018 0013   MCHC 32.2 03/19/2018 0013   RDW 14.6 03/19/2018 0013   LYMPHSABS 2.2 03/18/2018 1745   MONOABS 0.8 03/18/2018 1745   EOSABS 0.2 03/18/2018 1745   BASOSABS 0.1 03/18/2018 1745    BMET    Component Value Date/Time   NA 140 03/19/2018 0013   NA 142 08/06/2017 1413   K 3.2 (L) 03/19/2018 0013   CL 106 03/19/2018 0013   CO2 27 03/19/2018 0013   GLUCOSE 78 03/19/2018 0013   BUN 61 (H) 03/19/2018 0013   BUN 55 (H) 08/06/2017 1413   CREATININE 3.73 (H) 03/19/2018 0013   CREATININE 2.01 (H) 07/28/2015 1606   CALCIUM 8.3 (L) 03/19/2018 0013   GFRNONAA 16 (L) 03/19/2018 0013   GFRAA 18 (L) 03/19/2018 0013    INR    Component Value Date/Time   INR 1.11 03/19/2018 0013     Intake/Output Summary (Last 24 hours) at 03/19/2018 0738 Last data filed at 03/18/2018 2230 Gross per 24 hour  Intake 250 ml  Output 425 ml  Net -175 ml     Assessment:  67 y.o. male is s/p:   #1 ligation of right brachiobasilic fistula, #2 left radiocephalic AV fistula  1 Day Post-Op  Plan: -pt doing much better this am-right hand improved -left RC AVF with excellent thrill; no evidence of steal -he has f/u in one month-he  will keep that appt -he will call us sooner should he have any problems -dc home today    Leontine Locket, PA-C [facility] [phone] 03/19/2018 7:38 AM

## 2018-03-23 NOTE — Consult Note (Signed)
            [facility] CM Primary Care Navigator  03/23/2018  [name] November 13, 1950 [id number]   Attempt to seepatient at the bedside to identify possible discharge needs but he was already discharged homeper staff.  Per MD note,patientpresented to the emergency room with cyanosis and persistent numbness in his right hand post surgery. He has chronic renal insufficiency and recently underwent a first stage right brachiobasilic fistula as an outpatient. (status post ligation of right brachiobasilic fistula, and insertion of left radiocephalic AV fistula)  Patient has discharge instruction to follow-up withvascular surgery on 04/20/18.  Primary care provider's office is listed as providing transition of care (TOC) follow-up.   For additional questions please contact:  Edwena Felty A. Sakara Lehtinen, BSN, RN-BC [facility] PRIMARY CARE Navigator Cell: [phone]

## 2018-04-20 ENCOUNTER — Other Ambulatory Visit (HOSPITAL_COMMUNITY): Payer: Medicare Other

## 2018-04-20 ENCOUNTER — Encounter: Payer: Self-pay | Admitting: Vascular Surgery

## 2018-04-20 ENCOUNTER — Ambulatory Visit (INDEPENDENT_AMBULATORY_CARE_PROVIDER_SITE_OTHER): Payer: Self-pay | Admitting: Vascular Surgery

## 2018-04-20 VITALS — BP 166/80 | Temp 98.6°F | Ht 76.0 in | Wt 250.2 lb

## 2018-04-20 DIAGNOSIS — N184 Chronic kidney disease, stage 4 (severe): Secondary | ICD-10-CM

## 2018-04-20 NOTE — Progress Notes (Signed)
Patient name: [name] MRN: [id number] DOB: 05/09/1951 Sex: male  REASON FOR VISIT: Follow-up. Access surgery  HPI: [name] is a 67 y.o. male here today for follow-up.  He had initial placement of a right brachiocephalic fistula on 78/29/5621.  He does have a left pacemaker.  He had a presentation with severe steal syndrome and on 03/18/2018 had ligation of his right brachiocephalic fistula and placement of a left radiocephalic fistula.  Fortunately he has had complete resolution of steal symptoms.  He does still report some tightness at the antecubital incision on the left.  He has no steal symptoms in his left arm and fortunately has no swelling  Current Outpatient Medications  Medication Sig Dispense Refill  . acetaminophen (TYLENOL) 325 MG tablet Take 975 mg by mouth every morning.     Marland Kitchen albuterol (VENTOLIN HFA) 108 (90 BASE) MCG/ACT inhaler Inhale 2 puffs into the lungs every 6 (six) hours as needed for wheezing or shortness of breath.     . carvedilol (COREG) 25 MG tablet Take 25 mg by mouth 2 (two) times daily with a meal.    . Cholecalciferol (VITAMIN D3) 2000 units TABS Take 2,000 Units by mouth daily.     Marland Kitchen diltiazem (CARDIZEM) 30 MG tablet Take 1 tablet (30 mg total) by mouth 2 (two) times daily as needed (As needed for palpitations.). 30 tablet 6  . ELIQUIS 5 MG TABS tablet TAKE ONE TABLET BY MOUTH TWICE DAILY (Patient taking differently: Take 5 mg by mouth 2 (two) times daily. ) 60 tablet 3  . hydrALAZINE (APRESOLINE) 100 MG tablet TAKE ONE TABLET BY MOUTH THREE TIMES DAILY (Patient taking differently: Take 100 mg by mouth 3 (three) times daily. ) 90 tablet 3  . insulin glargine (LANTUS) 100 UNIT/ML injection Inject 35 Units daily after breakfast into the skin.     Marland Kitchen iron polysaccharides (NIFEREX) 150 MG capsule Take 150 mg by mouth daily.    Marland Kitchen LINZESS 290 MCG CAPS capsule Take 290 mcg by mouth daily as needed (for constipation).   6    . Melatonin 10 MG TABS Take 10-30 mg by mouth at bedtime.     . Multiple Vitamin (MULTIVITAMIN) tablet Take 1 tablet by mouth daily.      Marland Kitchen omeprazole (PRILOSEC) 20 MG capsule Take 20 mg daily by mouth.    . oxyCODONE-acetaminophen (PERCOCET) 5-325 MG tablet Take 1 tablet by mouth every 6 (six) hours as needed for severe pain. 8 tablet 0  . polyethylene glycol (MIRALAX / GLYCOLAX) packet Take 17 g by mouth every morning.    . potassium chloride SA (K-DUR,KLOR-CON) 20 MEQ tablet Take 1 tablet (20 mEq total) by mouth daily. 30 tablet 0  . QVAR REDIHALER 80 MCG/ACT inhaler Inhale 2 puffs into the lungs 2 (two) times daily.  12  . rosuvastatin (CRESTOR) 20 MG tablet TAKE ONE TABLET BY MOUTH EVERY DAY (Patient taking differently: Take 20 mg by mouth daily. ) 30 tablet 3  . sildenafil (VIAGRA) 100 MG tablet Take 1 tablet (100 mg total) by mouth as needed for erectile dysfunction. 10 tablet 0  . silver sulfADIAZINE (SILVADENE) 1 % cream Apply 1 application topically daily as needed (For wound care with dressing).     . torsemide (DEMADEX) 100 MG tablet Take 50 mg by mouth 2 (two) times daily. Changed by Dr. Lowanda Foster this March.  4   No current facility-administered medications for this visit.      PHYSICAL EXAM:  Vitals:   04/20/18 1113  BP: (!) 166/80  Temp: 98.6 F (37 C)  Weight: 250 lb 4 oz (113.5 kg)  Height: 6\' 4"  (1.93 m)    GENERAL: The patient is a well-nourished male, in no acute distress. The vital signs are documented above. With thrill throughout his cephalic vein fistula with very good [name] maturation.  The vein is superficial and does run in a straight course.  MEDICAL ISSUES: Good Dorinne Graeff result from a left radiocephalic fistula with no evidence of swelling to suggest proximal subclavian occlusion.  We will continue to monitor this.  He is not on hemodialysis.  I feel he has a good chance that this will be successful when he needs hemodialysis access.  He will see [place] again on  an as needed basis   Rosetta Posner, MD [facility] Tel [phone] Pager [phone]

## 2018-04-22 ENCOUNTER — Encounter: Payer: Self-pay | Admitting: Internal Medicine

## 2018-04-22 ENCOUNTER — Other Ambulatory Visit: Payer: Self-pay | Admitting: Cardiology

## 2018-04-22 DIAGNOSIS — Z79899 Other long term (current) drug therapy: Secondary | ICD-10-CM | POA: Diagnosis not present

## 2018-04-22 DIAGNOSIS — N183 Chronic kidney disease, stage 3 (moderate): Secondary | ICD-10-CM | POA: Diagnosis not present

## 2018-04-22 DIAGNOSIS — I10 Essential (primary) hypertension: Secondary | ICD-10-CM | POA: Diagnosis not present

## 2018-04-22 DIAGNOSIS — R809 Proteinuria, unspecified: Secondary | ICD-10-CM | POA: Diagnosis not present

## 2018-04-22 DIAGNOSIS — E559 Vitamin D deficiency, unspecified: Secondary | ICD-10-CM | POA: Diagnosis not present

## 2018-04-22 DIAGNOSIS — D509 Iron deficiency anemia, unspecified: Secondary | ICD-10-CM | POA: Diagnosis not present

## 2018-04-24 ENCOUNTER — Ambulatory Visit (INDEPENDENT_AMBULATORY_CARE_PROVIDER_SITE_OTHER): Payer: Medicare Other | Admitting: Cardiology

## 2018-04-24 ENCOUNTER — Encounter: Payer: Self-pay | Admitting: Cardiology

## 2018-04-24 VITALS — BP 158/68 | HR 61 | Ht 76.0 in | Wt 250.0 lb

## 2018-04-24 DIAGNOSIS — N184 Chronic kidney disease, stage 4 (severe): Secondary | ICD-10-CM | POA: Diagnosis not present

## 2018-04-24 DIAGNOSIS — I2584 Coronary atherosclerosis due to calcified coronary lesion: Secondary | ICD-10-CM | POA: Diagnosis not present

## 2018-04-24 DIAGNOSIS — I495 Sick sinus syndrome: Secondary | ICD-10-CM | POA: Diagnosis not present

## 2018-04-24 DIAGNOSIS — I5032 Chronic diastolic (congestive) heart failure: Secondary | ICD-10-CM | POA: Diagnosis not present

## 2018-04-24 DIAGNOSIS — I2510 Atherosclerotic heart disease of native coronary artery without angina pectoris: Secondary | ICD-10-CM | POA: Diagnosis not present

## 2018-04-24 DIAGNOSIS — I480 Paroxysmal atrial fibrillation: Secondary | ICD-10-CM

## 2018-04-24 DIAGNOSIS — Z950 Presence of cardiac pacemaker: Secondary | ICD-10-CM

## 2018-04-24 DIAGNOSIS — I10 Essential (primary) hypertension: Secondary | ICD-10-CM | POA: Diagnosis not present

## 2018-04-24 MED ORDER — DILTIAZEM HCL 30 MG PO TABS: 30.0000 mg | ORAL_TABLET | Freq: Two times a day (BID) | ORAL | 6 refills | Status: DC

## 2018-04-24 NOTE — Progress Notes (Signed)
Clinical Summary Mr. Bart is a 67 y.o.male seen today for follow up of the following medical problems.   1. Aflutter/Afib - he is on eliquis - low afib burden by pacemaker check  - no recent palpitations. No bleeding on eliquis.     2. Symptomatic bradycardia - s/p pacemaker placement following episode of syncope - admit 09/28/17 to [place] [place] [facility] with dizziness and syncope. - followed by EP. Chronically elevated RV lead threshold but stable - normal device check 01/2018  - no recent symptoms.    3. OSA screen - very mild OSA by 11/2017 study,did have sleep hypoxemia   4. Carotid stenosis - noted during recent admission for syncope.  -09/2017 carotid US in [place] with  RICA <70%, LICA 01-74%    5. HTN - home bp's 160/80s  6. CAD  - prior stent to RCA in 1999. Last cath 05/2001 : LM 20-30%, LAD 50-60% And 60-70% mid, LCX with mid AV portion 60-70%, RCA with patent stent with proximal 40-50% disease. LVEF by LVgram 65%, LVEDP 20. Overall moderate lesions, not great anatomically for PCI per notes, treated medically.  - 10/2014 Lexiscan with small area of ischemia mid inferior to apical, overall low risk - Jan 2019 nuclear stress no ischemia  - no recent chest pain.   7. Chronic diastolic HF -94/4967  echo LVEF 60-65%, grade II diastoilc dysfunction - compliant with meds  - occasional LE edema, fairly well controlled with diuretics.    8. CKD IV - followed by renal  - has undergone fistula placement 02/2018 - followed by Dr Hinda Lenis, monitoring for possible HD needs   6. Hyperlipidemia  - compliant with statin   7. COPD - followed by pcp   8. AAA 06/2016 CT A/P 3.4 cm infrerenal AAA - needs repeat US next year.   Past Medical History:  Diagnosis Date  . Asthma   . Atrial flutter ([place])   . CAD (coronary artery disease)    a. s/p prior stenting of RCA in 1999 b. cath in 2003 showing moderate CAD c. NST in 2016 showing small  area of ischemic and low-risk  . Chronic renal insufficiency   . COPD (chronic obstructive pulmonary disease) ([place])    with ongoing tobacco use and patient failed sham takes  . DM2 (diabetes mellitus, type 2) ([place])   . Dyslipidemia   . Edema    chronic lower extremity secondary to right heart failure and chronic venous insufficiency  . Headache   . HTN (hypertension)   . Morbid obesity ([place])   . Pneumonia   . Presence of permanent cardiac pacemaker   . 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   Marland Kitchen Reflux esophagitis    hx  . Renal insufficiency   . Sleep apnea   . Two-vessel coronary artery disease    moderate. by cath in 2003. Status post stenting of the mdi RCA November 1999 normal left ventricular ejection fraction  . Wears glasses   . Wears hearing aid    B/L     No Known Allergies   Current Outpatient Medications  Medication Sig Dispense Refill  . acetaminophen (TYLENOL) 325 MG tablet Take 975 mg by mouth every morning.     Marland Kitchen albuterol (VENTOLIN HFA) 108 (90 BASE) MCG/ACT inhaler Inhale 2 puffs into the lungs every 6 (six) hours as needed for wheezing or shortness of breath.     . carvedilol (COREG)  25 MG tablet Take 25 mg by mouth 2 (two) times daily with a meal.    . Cholecalciferol (VITAMIN D3) 2000 units TABS Take 2,000 Units by mouth daily.     Marland Kitchen diltiazem (CARDIZEM) 30 MG tablet Take 1 tablet (30 mg total) by mouth 2 (two) times daily as needed (As needed for palpitations.). 30 tablet 6  . ELIQUIS 5 MG TABS tablet TAKE ONE TABLET BY MOUTH TWICE DAILY 60 tablet 3  . hydrALAZINE (APRESOLINE) 100 MG tablet TAKE ONE TABLET BY MOUTH THREE TIMES DAILY 90 tablet 3  . insulin glargine (LANTUS) 100 UNIT/ML injection Inject 35 Units daily after breakfast into the skin.     Marland Kitchen iron polysaccharides (NIFEREX) 150 MG capsule Take 150 mg by mouth daily.    Marland Kitchen LINZESS 290 MCG CAPS capsule Take 290 mcg by mouth daily as needed  (for constipation).   6  . Melatonin 10 MG TABS Take 10-30 mg by mouth at bedtime.     . Multiple Vitamin (MULTIVITAMIN) tablet Take 1 tablet by mouth daily.      Marland Kitchen omeprazole (PRILOSEC) 20 MG capsule Take 20 mg daily by mouth.    . oxyCODONE-acetaminophen (PERCOCET) 5-325 MG tablet Take 1 tablet by mouth every 6 (six) hours as needed for severe pain. 8 tablet 0  . polyethylene glycol (MIRALAX / GLYCOLAX) packet Take 17 g by mouth every morning.    . potassium chloride SA (K-DUR,KLOR-CON) 20 MEQ tablet Take 1 tablet (20 mEq total) by mouth daily. 30 tablet 0  . QVAR REDIHALER 80 MCG/ACT inhaler Inhale 2 puffs into the lungs 2 (two) times daily.  12  . rosuvastatin (CRESTOR) 20 MG tablet TAKE ONE TABLET BY MOUTH EVERY DAY 30 tablet 3  . sildenafil (VIAGRA) 100 MG tablet Take 1 tablet (100 mg total) by mouth as needed for erectile dysfunction. 10 tablet 0  . silver sulfADIAZINE (SILVADENE) 1 % cream Apply 1 application topically daily as needed (For wound care with dressing).     . torsemide (DEMADEX) 100 MG tablet Take 50 mg by mouth 2 (two) times daily. Changed by Dr. Lowanda Foster this March.  4   No current facility-administered medications for this visit.      Past Surgical History:  Procedure Laterality Date  . ABDOMINAL SURGERY    . APPENDECTOMY    . AV FISTULA PLACEMENT Right 03/11/2018   Procedure: CREATION Brachiocephalic Fistula RIGHT ARM;  Surgeon: Rosetta Posner, MD;  Location: [facility];  Service: Vascular;  Laterality: Right;  . BACK SURGERY    . CARDIAC CATHETERIZATION     stent  . CHOLECYSTECTOMY    . CLOSED REDUCTION SHOULDER DISLOCATION    . COLONOSCOPY W/ BIOPSIES AND POLYPECTOMY    . diabetic ulcers    . GROIN EXPLORATION    . INSERTION OF ARTERIOVENOUS (AV) ARTEGRAFT ARM Left 03/18/2018   Procedure: INSERTION OF ARTERIOVENOUS (AV) FISTULA;  Surgeon: Rosetta Posner, MD;  Location: [facility];  Service: Vascular;  Laterality: Left;  . LIGATION ARTERIOVENOUS GORTEX GRAFT Right  03/18/2018   Procedure: LIGATION ARTERIOVENOUS FISTULA;  Surgeon: Rosetta Posner, MD;  Location: [facility];  Service: Vascular;  Laterality: Right;  . OSTECTOMY Left 04/23/2017   Procedure: OSTECTOMY LEFT GREAT TOE;  Surgeon: [name], DPM;  Location: [facility];  Service: Podiatry;  Laterality: Left;  left great toe  . TUMOR REMOVAL     small intestine      No Known Allergies    Family History  Problem Relation Age of Onset  . Diabetes Mother   . Alcohol abuse Father      Social History Mr. Yett reports that he has been smoking cigarettes. He started smoking about 49 years ago. He has a 40.00 pack-year smoking history. He has never used smokeless tobacco. Mr. Godown reports that he does not drink alcohol.   Review of Systems CONSTITUTIONAL:+fatigue HEENT: Eyes: No visual loss, blurred vision, double vision or yellow sclerae.No hearing loss, sneezing, congestion, runny nose or sore throat.  SKIN: No rash or itching.  CARDIOVASCULAR: per hpi RESPIRATORY: No shortness of breath, cough or sputum.  GASTROINTESTINAL: No anorexia, nausea, vomiting or diarrhea. No abdominal pain or blood.  GENITOURINARY: No burning on urination, no polyuria NEUROLOGICAL: No headache, dizziness, syncope, paralysis, ataxia, numbness or tingling in the extremities. No change in bowel or bladder control.  MUSCULOSKELETAL: No muscle, back pain, joint pain or stiffness.  LYMPHATICS: No enlarged nodes. No history of splenectomy.  PSYCHIATRIC: No history of depression or anxiety.  ENDOCRINOLOGIC: No reports of sweating, cold or heat intolerance. No polyuria or polydipsia.  Marland Kitchen   Physical Examination Vitals:   04/24/18 0956  BP: (!) 158/68  Pulse: 61  SpO2: 96%   Vitals:   04/24/18 0956  Weight: 250 lb (113.4 kg)  Height: 6\' 4"  (1.93 m)    Gen: resting comfortably, no acute distress HEENT: no scleral icterus, pupils equal round and reactive, no palptable cervical adenopathy,  CV: RRR, no  m/r/g, no jvd Resp: Clear to auscultation bilaterally GI: abdomen is soft, non-tender, non-distended, normal bowel sounds, no hepatosplenomegaly MSK: extremities are warm, no edema.  Skin: warm, no rash Neuro:  no focal deficits Psych: appropriate affect   Diagnostic Studies 05/2001 Cath FINDINGS:  1. Left main trunk: Medium caliber vessel. This is a long vessel with a  distal taper of 20-30%.  2. Left anterior descending artery: This is a medium caliber vessel that  provides a large bifurcating first diagonal Jermon Chalfant in the proximal segment  and before then extending to the apex, the LAD tapers significantly after  the diagonal Marguerita Stapp. There is moderate disease of 50-60% after the  diagonal Shirl Weir and then a focal eccentric narrowing of 60-70% in the mid  section of the LAD. The diagonal Zaccary Creech has moderate disease of 30-40% in  the proximal segment. The lateral division is a small caliber vessel with  an ostial narrowing of 60%.  3. Left circumflex artery: This is a small caliber vessel that provides a  trivial first marginal Grantland Want in the mid section and a medium caliber  second marginal Fortino Haag distally. The mid AV circumflex at the takeoff of  the first and second diagonal branches had moderate diffuse disease of  60-70%.  4. Right coronary artery: Dominant. This is a large caliber vessel that  provides a posterior descending artery and two posteroventricular branches  in the terminal segment. The right coronary artery has evidence of an  existing stent in the mid section that is widely patent. Prior to stent is  a focal discrete narrowing of 40-50%. Distal to the stent is moderate  disease of 30%. The distal Diedra Sinor vessels also have mild disease of 30%.  5. Left ventricle: Normal end-systolic and end-diastolic dimensions. Overall  left ventricular function is well preserved. Ejection fraction is greater  than 65%. No mitral regurgitation. LV pressure is  180/10, aortic is  180/90. LV EDP equals 20.  ASSESSMENT AND PLAN: Mr. Palmeri is a 67 year old gentleman with moderate  three-vessel coronary artery disease. The patients plaque burden in the left  anterior descending artery and circumflex appears to have increased somewhat  compared to his previous angiogram. Unfortunately, the mid and distal left  anterior descending artery is a small caliber vessel with a long diffuse  segment of disease and would be associated with a high restenosis rate.  Similarly, the circumflex would suffer the same fate.  Further assessment with a stress imaging study would be prudent to isolate the  area of ischemia. Percutaneous intervention may then be targeted if  indicated. In addition, aggressive medical therapy should be pursued as the  patient currently is not on any anti-anginal therapy and has poorly controlled  hypertension. The patient will also be counseled regarding smoking cessation.    07/2013 PFTs Mild obstruction    10/2014 Lexiscan  Unable to adequately ambulate to treadmill due to right hip pain. Lexiscan employed.   Defect 1: There is a small defect of mild severity present in the mid inferior and apical inferior location. This defect is partially reversible.   This is a low risk study.   Nuclear stress EF: 58%.   Small region of apical inferior ischemia    07/2015 Echo Study Conclusions  - Left ventricle: The cavity size was normal. Wall thickness was  increased increased in a pattern of mild to moderate LVH.  Systolic function was normal. The estimated ejection fraction was  in the range of 60% to 65%. Diastolic function is abnormal,  indeterminate grade. Wall motion was normal; there were no  regional wall motion abnormalities. - Aortic valve: Mildly calcified annulus. Trileaflet; mildly  thickened leaflets. Valve area (VTI): 2.39 cm^2. Valve area  (Vmax): 2.58 cm^2. Valve area (Vmean): 2.7  cm^2. - Mitral valve: Mildly calcified annulus. Normal thickness leaflets  . - Technically adequate study.  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 Recommend followup by ultrasound in 2 years. This recommendation follows ACR consensus guidelines:   Jan 2019 nuclear stress  T wave inversions in inferior leads and nonspecific T wave abnormalities in V6 seen throughout study.  The study is normal. No myocardial ischemia or scar.  This is a low risk study.  Nuclear stress EF: 56%.   07/2017 event monitor  14 day event monitor  Min HR 51, Max HR 115, Avg HR 69  Telemetry tracings show sinus rhythm and rate controlled atrial fibrillation  Reported symptoms correlated with rate controlled afib    Assessment and Plan   1.Paroxysmal aflutter/Afib/ Tachybrady syndrome - CHADS2Vasc score is 4 (HTN, CHF, CAD, DM2), continue eliquis - no recent symptoms - pacemaker with normal function, contniue to follow with EP  2. HTN - above goal, will change dilt 30mg  prn to 30mg  bid scheduled.   3. AAA - needs repeat US Spring of 2020   3. CAD  -no recent symptoms, recent stress test without ischemia - continue current meds  4. Hyperlipidemia  - continue statin  5. Chronic diastolic HF -appears euvolemic, complicated by CKD IV. Defer diuretics to nephrology.   6. CKD IV - followed by Dr Hinda Lenis. Fistula placed, monitoring closely for possible HD needs.   F/u 4 months. At that time arrange AAA [place] and carotid US    Arnoldo Lenis, M.D.

## 2018-04-24 NOTE — Patient Instructions (Signed)
Your physician recommends that you schedule a follow-up appointment in: [place] has recommended you make the following change in your medication:   CHANGE DILTIAZEM 30 MG TWICE DAILY   Thank you for choosing [facility]!!

## 2018-04-27 ENCOUNTER — Encounter: Payer: Self-pay | Admitting: *Deleted

## 2018-04-27 DIAGNOSIS — L97511 Non-pressure chronic ulcer of other part of right foot limited to breakdown of skin: Secondary | ICD-10-CM | POA: Diagnosis not present

## 2018-04-27 DIAGNOSIS — E1142 Type 2 diabetes mellitus with diabetic polyneuropathy: Secondary | ICD-10-CM | POA: Diagnosis not present

## 2018-04-29 ENCOUNTER — Other Ambulatory Visit: Payer: Self-pay | Admitting: Internal Medicine

## 2018-04-29 DIAGNOSIS — N185 Chronic kidney disease, stage 5: Secondary | ICD-10-CM | POA: Diagnosis not present

## 2018-04-29 DIAGNOSIS — I10 Essential (primary) hypertension: Secondary | ICD-10-CM | POA: Diagnosis not present

## 2018-04-29 DIAGNOSIS — D509 Iron deficiency anemia, unspecified: Secondary | ICD-10-CM | POA: Diagnosis not present

## 2018-04-29 DIAGNOSIS — R809 Proteinuria, unspecified: Secondary | ICD-10-CM | POA: Diagnosis not present

## 2018-05-06 ENCOUNTER — Ambulatory Visit (INDEPENDENT_AMBULATORY_CARE_PROVIDER_SITE_OTHER): Payer: Medicare Other

## 2018-05-06 DIAGNOSIS — I495 Sick sinus syndrome: Secondary | ICD-10-CM | POA: Diagnosis not present

## 2018-05-06 NOTE — Progress Notes (Signed)
Remote pacemaker transmission.   

## 2018-05-07 ENCOUNTER — Encounter (HOSPITAL_COMMUNITY)
Admission: RE | Admit: 2018-05-07 | Discharge: 2018-05-07 | Disposition: A | Discharge status: Home / Self Care | Payer: Medicare Other | Source: Ambulatory Visit | Attending: Nephrology | Admitting: Nephrology

## 2018-05-07 ENCOUNTER — Encounter (HOSPITAL_COMMUNITY): Payer: Self-pay

## 2018-05-07 ENCOUNTER — Encounter: Payer: Self-pay | Admitting: Cardiology

## 2018-05-07 DIAGNOSIS — D509 Iron deficiency anemia, unspecified: Secondary | ICD-10-CM | POA: Insufficient documentation

## 2018-05-07 MED ORDER — SODIUM CHLORIDE 0.9 % IV SOLN
INTRAVENOUS | Status: DC
  Administered 2018-05-07: 12:00:00 via INTRAVENOUS

## 2018-05-07 MED ORDER — SODIUM CHLORIDE 0.9 % IV SOLN
510.0000 mg | Freq: Once | INTRAVENOUS | Status: AC
  Administered 2018-05-07: 510 mg via INTRAVENOUS
  Filled 2018-05-07: qty 17

## 2018-05-08 DIAGNOSIS — E1142 Type 2 diabetes mellitus with diabetic polyneuropathy: Secondary | ICD-10-CM | POA: Diagnosis not present

## 2018-05-08 DIAGNOSIS — L97522 Non-pressure chronic ulcer of other part of left foot with fat layer exposed: Secondary | ICD-10-CM | POA: Diagnosis not present

## 2018-05-15 ENCOUNTER — Encounter (HOSPITAL_COMMUNITY): Admission: RE | Admit: 2018-05-15 | Payer: Medicare Other | Source: Ambulatory Visit

## 2018-05-19 ENCOUNTER — Encounter (HOSPITAL_COMMUNITY)
Admission: RE | Admit: 2018-05-19 | Discharge: 2018-05-19 | Disposition: A | Discharge status: Home / Self Care | Payer: Medicare Other | Source: Ambulatory Visit | Attending: Nephrology | Admitting: Nephrology

## 2018-05-19 ENCOUNTER — Encounter (HOSPITAL_COMMUNITY): Payer: Self-pay

## 2018-05-19 DIAGNOSIS — D509 Iron deficiency anemia, unspecified: Secondary | ICD-10-CM | POA: Diagnosis not present

## 2018-05-19 MED ORDER — SODIUM CHLORIDE 0.9 % IV SOLN: Freq: Once | INTRAVENOUS | Status: DC

## 2018-05-19 MED ORDER — SODIUM CHLORIDE 0.9 % IV SOLN
510.0000 mg | Freq: Once | INTRAVENOUS | Status: DC
  Filled 2018-05-19: qty 17

## 2018-05-20 DIAGNOSIS — R918 Other nonspecific abnormal finding of lung field: Secondary | ICD-10-CM

## 2018-05-20 HISTORY — DX: Other nonspecific abnormal finding of lung field: R91.8

## 2018-06-02 DIAGNOSIS — M129 Arthropathy, unspecified: Secondary | ICD-10-CM | POA: Diagnosis not present

## 2018-06-02 DIAGNOSIS — R911 Solitary pulmonary nodule: Secondary | ICD-10-CM | POA: Diagnosis not present

## 2018-06-02 DIAGNOSIS — Z720 Tobacco use: Secondary | ICD-10-CM | POA: Diagnosis not present

## 2018-06-02 DIAGNOSIS — E1122 Type 2 diabetes mellitus with diabetic chronic kidney disease: Secondary | ICD-10-CM | POA: Diagnosis not present

## 2018-06-02 DIAGNOSIS — N189 Chronic kidney disease, unspecified: Secondary | ICD-10-CM | POA: Diagnosis not present

## 2018-06-02 DIAGNOSIS — E039 Hypothyroidism, unspecified: Secondary | ICD-10-CM | POA: Diagnosis not present

## 2018-06-02 DIAGNOSIS — Z6830 Body mass index (BMI) 30.0-30.9, adult: Secondary | ICD-10-CM | POA: Diagnosis not present

## 2018-06-02 DIAGNOSIS — N529 Male erectile dysfunction, unspecified: Secondary | ICD-10-CM | POA: Diagnosis not present

## 2018-06-03 ENCOUNTER — Other Ambulatory Visit: Payer: Self-pay | Admitting: Family Medicine

## 2018-06-03 ENCOUNTER — Other Ambulatory Visit (HOSPITAL_COMMUNITY): Payer: Self-pay | Admitting: Family Medicine

## 2018-06-03 DIAGNOSIS — R911 Solitary pulmonary nodule: Secondary | ICD-10-CM

## 2018-06-07 LAB — CUP PACEART REMOTE DEVICE CHECK
Battery Remaining Longevity: 180 mo
Battery Remaining Percentage: 100 %
Brady Statistic RA Percent Paced: 34 %
Brady Statistic RV Percent Paced: 3 %
Date Time Interrogation Session: 20191218094200
Implantable Lead Implant Date: 20190514
Implantable Lead Implant Date: 20190514
Implantable Lead Location: 753859
Implantable Lead Location: 753860
Implantable Lead Model: 7741
Implantable Lead Model: 7742
Implantable Lead Serial Number: 1015225
Implantable Lead Serial Number: 1020907
Implantable Pulse Generator Implant Date: 20190514
Lead Channel Impedance Value: 706 Ohm
Lead Channel Impedance Value: 764 Ohm
Lead Channel Pacing Threshold Amplitude: 0.7 V
Lead Channel Pacing Threshold Pulse Width: 0.4 ms
Lead Channel Setting Pacing Amplitude: 2 V
Lead Channel Setting Pacing Amplitude: 3.5 V
Lead Channel Setting Pacing Pulse Width: 1 ms
Lead Channel Setting Sensing Sensitivity: 1.5 mV
Pulse Gen Serial Number: 832937

## 2018-06-08 ENCOUNTER — Other Ambulatory Visit: Payer: Self-pay | Admitting: *Deleted

## 2018-06-08 MED ORDER — DILTIAZEM HCL 30 MG PO TABS: 30.0000 mg | ORAL_TABLET | Freq: Two times a day (BID) | ORAL | 6 refills | Status: DC

## 2018-06-15 ENCOUNTER — Ambulatory Visit (HOSPITAL_COMMUNITY)
Admission: RE | Admit: 2018-06-15 | Discharge: 2018-06-15 | Disposition: A | Discharge status: Home / Self Care | Payer: Medicare Other | Source: Ambulatory Visit | Attending: Family Medicine | Admitting: Family Medicine

## 2018-06-15 DIAGNOSIS — R911 Solitary pulmonary nodule: Secondary | ICD-10-CM | POA: Insufficient documentation

## 2018-06-15 DIAGNOSIS — R918 Other nonspecific abnormal finding of lung field: Secondary | ICD-10-CM | POA: Diagnosis not present

## 2018-06-17 DIAGNOSIS — D500 Iron deficiency anemia secondary to blood loss (chronic): Secondary | ICD-10-CM | POA: Diagnosis not present

## 2018-06-24 ENCOUNTER — Telehealth: Payer: Self-pay

## 2018-06-24 DIAGNOSIS — D500 Iron deficiency anemia secondary to blood loss (chronic): Secondary | ICD-10-CM | POA: Diagnosis not present

## 2018-06-24 NOTE — Telephone Encounter (Signed)
LVM for pt to call device clinic to assess for SOB, increased edema. Heart Failure Alert received.

## 2018-07-01 NOTE — Telephone Encounter (Signed)
error 

## 2018-07-06 DIAGNOSIS — E1142 Type 2 diabetes mellitus with diabetic polyneuropathy: Secondary | ICD-10-CM | POA: Diagnosis not present

## 2018-07-06 DIAGNOSIS — L97512 Non-pressure chronic ulcer of other part of right foot with fat layer exposed: Secondary | ICD-10-CM | POA: Diagnosis not present

## 2018-07-14 ENCOUNTER — Other Ambulatory Visit: Payer: Self-pay | Admitting: Student

## 2018-07-15 DIAGNOSIS — I10 Essential (primary) hypertension: Secondary | ICD-10-CM | POA: Diagnosis not present

## 2018-07-15 DIAGNOSIS — Z79899 Other long term (current) drug therapy: Secondary | ICD-10-CM | POA: Diagnosis not present

## 2018-07-15 DIAGNOSIS — E559 Vitamin D deficiency, unspecified: Secondary | ICD-10-CM | POA: Diagnosis not present

## 2018-07-15 DIAGNOSIS — D509 Iron deficiency anemia, unspecified: Secondary | ICD-10-CM | POA: Diagnosis not present

## 2018-07-15 DIAGNOSIS — R809 Proteinuria, unspecified: Secondary | ICD-10-CM | POA: Diagnosis not present

## 2018-07-15 DIAGNOSIS — N183 Chronic kidney disease, stage 3 (moderate): Secondary | ICD-10-CM | POA: Diagnosis not present

## 2018-07-16 ENCOUNTER — Telehealth: Payer: Self-pay | Admitting: *Deleted

## 2018-07-16 NOTE — Telephone Encounter (Signed)
Patient contacted office saying that [facility] needed a statement from his cardiologist to have a kidney transplant. Patient advised that we typically get these request directly from the requesting provider's office. Patient advised that message would be sent to provider for recommendations and also that his last office notes, tests etc, would be sent to their department to the attention of Nicole W. Iran RN [phone] phone and [phone] fax.

## 2018-07-20 ENCOUNTER — Encounter: Payer: Self-pay | Admitting: *Deleted

## 2018-07-20 DIAGNOSIS — L97511 Non-pressure chronic ulcer of other part of right foot limited to breakdown of skin: Secondary | ICD-10-CM | POA: Diagnosis not present

## 2018-07-20 DIAGNOSIS — E1142 Type 2 diabetes mellitus with diabetic polyneuropathy: Secondary | ICD-10-CM | POA: Diagnosis not present

## 2018-07-22 DIAGNOSIS — D638 Anemia in other chronic diseases classified elsewhere: Secondary | ICD-10-CM | POA: Diagnosis not present

## 2018-07-22 DIAGNOSIS — M9080 Osteopathy in diseases classified elsewhere, unspecified site: Secondary | ICD-10-CM | POA: Diagnosis not present

## 2018-07-22 DIAGNOSIS — R809 Proteinuria, unspecified: Secondary | ICD-10-CM | POA: Diagnosis not present

## 2018-07-22 DIAGNOSIS — N185 Chronic kidney disease, stage 5: Secondary | ICD-10-CM | POA: Diagnosis not present

## 2018-07-23 ENCOUNTER — Ambulatory Visit (INDEPENDENT_AMBULATORY_CARE_PROVIDER_SITE_OTHER): Payer: Medicare Other | Admitting: Pulmonary Disease

## 2018-07-23 ENCOUNTER — Encounter: Payer: Self-pay | Admitting: Pulmonary Disease

## 2018-07-23 VITALS — BP 126/74 | HR 84 | Ht 76.0 in | Wt 251.4 lb

## 2018-07-23 DIAGNOSIS — J449 Chronic obstructive pulmonary disease, unspecified: Secondary | ICD-10-CM

## 2018-07-23 NOTE — Patient Instructions (Addendum)
Obtain a full pulmonary function study  Follow-up discussion about quitting smoking-this needs to be done to make progress about your candidacy for transplant  Rescue inhaler use as needed  I will see you back in the office in about 3 months Call with any significant concerns

## 2018-07-23 NOTE — Progress Notes (Signed)
[name]    [id number]    07-07-1950  Primary Care Physician:Howard, Lennette Bihari, MD  Referring Physician: [name], MD [place], [place] [address]  Chief complaint:   Patient was referred secondary to hypoxemia on recent polysomnogram Repeat oximetry did not reveal significant desaturations He feels well Has no complaints today Being evaluated for transplant  HPI: Patient had a sleep study 12/17/2017-significant for hypoxemia, very mild obstructive sleep apnea that does not meet threshold for treatment He had a repeat oximetry that showed adequate saturations He is currently being evaluated for a kidney transplant He is an active smoker Has a history of obstructive lung disease, chronic kidney disease for which fistula is been planned Diabetes  He denies having any significant symptoms at present Able to ambulate without difficulty, does get tired but not really short of breath Able to walk up 20 steps without stopping Has no chronic cough  Denies shortness of breath with regular activity No chest pains or chest discomfort  Occupation: Was a Engineer, structural Exposures: No significant exposures Smoking history: Active smoker-pack a day  He continues to smoke actively  Outpatient Encounter Medications as of 07/23/2018  Medication Sig  . acetaminophen (TYLENOL) 325 MG tablet Take 975 mg by mouth every morning.   Marland Kitchen albuterol (VENTOLIN HFA) 108 (90 BASE) MCG/ACT inhaler Inhale 2 puffs into the lungs every 6 (six) hours as needed for wheezing or shortness of breath.   . carvedilol (COREG) 25 MG tablet TAKE ONE TABLET BY MOUTH TWICE DAILY.  Marland Kitchen Cholecalciferol (VITAMIN D3) 2000 units TABS Take 2,000 Units by mouth daily.   Marland Kitchen diltiazem (CARDIZEM) 30 MG tablet Take 1 tablet (30 mg total) by mouth 2 (two) times daily.  Marland Kitchen ELIQUIS 5 MG TABS tablet TAKE ONE TABLET BY MOUTH TWICE DAILY  . hydrALAZINE (APRESOLINE) 100 MG tablet TAKE ONE TABLET BY MOUTH THREE TIMES  DAILY  . insulin glargine (LANTUS) 100 UNIT/ML injection Inject 35 Units daily after breakfast into the skin.   Marland Kitchen iron polysaccharides (NIFEREX) 150 MG capsule Take 150 mg by mouth daily.  Marland Kitchen LINZESS 290 MCG CAPS capsule Take 290 mcg by mouth daily as needed (for constipation).   . Melatonin 10 MG TABS Take 10-30 mg by mouth at bedtime.   . Multiple Vitamin (MULTIVITAMIN) tablet Take 1 tablet by mouth daily.    Marland Kitchen omeprazole (PRILOSEC) 20 MG capsule Take 20 mg daily by mouth.  . oxyCODONE-acetaminophen (PERCOCET) 5-325 MG tablet Take 1 tablet by mouth every 6 (six) hours as needed for severe pain.  . polyethylene glycol (MIRALAX / GLYCOLAX) packet Take 17 g by mouth every morning.  . potassium chloride SA (K-DUR,KLOR-CON) 20 MEQ tablet Take 1 tablet (20 mEq total) by mouth daily.  Marland Kitchen QVAR REDIHALER 80 MCG/ACT inhaler Inhale 2 puffs into the lungs 2 (two) times daily.  . rosuvastatin (CRESTOR) 20 MG tablet TAKE ONE TABLET BY MOUTH EVERY DAY  . sildenafil (VIAGRA) 100 MG tablet Take 1 tablet (100 mg total) by mouth as needed for erectile dysfunction.  . silver sulfADIAZINE (SILVADENE) 1 % cream Apply 1 application topically daily as needed (For wound care with dressing).   . torsemide (DEMADEX) 100 MG tablet Take 50 mg by mouth 2 (two) times daily. Changed by Dr. Lowanda Foster this March.   No facility-administered encounter medications on file as of 07/23/2018.     Allergies as of 07/23/2018  . (No Known Allergies)  Past Medical History:  Diagnosis Date  . Asthma   . Atrial flutter ([place])   . CAD (coronary artery disease)    a. s/p prior stenting of RCA in 1999 b. cath in 2003 showing moderate CAD c. NST in 2016 showing small area of ischemic and low-risk  . Chronic renal insufficiency   . COPD (chronic obstructive pulmonary disease) ([place])    with ongoing tobacco use and patient failed sham takes  . DM2 (diabetes mellitus, type 2) ([place])   . Dyslipidemia   . Edema    chronic lower extremity  secondary to right heart failure and chronic venous insufficiency  . Headache   . HTN (hypertension)   . Morbid obesity ([place])   . Pneumonia   . Presence of permanent cardiac pacemaker   . 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   Marland Kitchen Reflux esophagitis    hx  . Renal insufficiency   . Sleep apnea   . Two-vessel coronary artery disease    moderate. by cath in 2003. Status post stenting of the mdi RCA November 1999 normal left ventricular ejection fraction  . Wears glasses   . Wears hearing aid    B/L    Past Surgical History:  Procedure Laterality Date  . ABDOMINAL SURGERY    . APPENDECTOMY    . AV FISTULA PLACEMENT Right 03/11/2018   Procedure: CREATION Brachiocephalic Fistula RIGHT ARM;  Surgeon: Rosetta Posner, MD;  Location: [facility];  Service: Vascular;  Laterality: Right;  . BACK SURGERY    . CARDIAC CATHETERIZATION     stent  . CHOLECYSTECTOMY    . CLOSED REDUCTION SHOULDER DISLOCATION    . COLONOSCOPY W/ BIOPSIES AND POLYPECTOMY    . diabetic ulcers    . GROIN EXPLORATION    . INSERTION OF ARTERIOVENOUS (AV) ARTEGRAFT ARM Left 03/18/2018   Procedure: INSERTION OF ARTERIOVENOUS (AV) FISTULA;  Surgeon: Rosetta Posner, MD;  Location: [facility];  Service: Vascular;  Laterality: Left;  . LIGATION ARTERIOVENOUS GORTEX GRAFT Right 03/18/2018   Procedure: LIGATION ARTERIOVENOUS FISTULA;  Surgeon: Rosetta Posner, MD;  Location: [facility];  Service: Vascular;  Laterality: Right;  . OSTECTOMY Left 04/23/2017   Procedure: OSTECTOMY LEFT GREAT TOE;  Surgeon: [name], DPM;  Location: [facility];  Service: Podiatry;  Laterality: Left;  left great toe  . TUMOR REMOVAL     small intestine     Family History  Problem Relation Age of Onset  . Diabetes Mother   . Alcohol abuse Father     Social History   Socioeconomic History  . Marital status: Married    Spouse name: Not on file  . Number of children: Not on file  . Years of  education: Not on file  . Highest education level: Not on file  Occupational History  . Not on file  Social Needs  . Financial resource strain: Not on file  . Food insecurity:    Worry: Not on file    Inability: Not on file  . Transportation needs:    Medical: Not on file    Non-medical: Not on file  Tobacco Use  . Smoking status: Current Every Day Smoker    Packs/day: 1.00    Years: 40.00    Pack years: 40.00    Types: Cigarettes    Start date: 05/20/1968  . Smokeless tobacco: Never Used  Substance and Sexual Activity  . Alcohol use: No  Alcohol/week: 0.0 standard drinks  . Drug use: No  . Sexual activity: Yes  Lifestyle  . Physical activity:    Days per week: Not on file    Minutes per session: Not on file  . Stress: Not on file  Relationships  . Social connections:    Talks on phone: Not on file    Gets together: Not on file    Attends religious service: Not on file    Active member of club or organization: Not on file    Attends meetings of clubs or organizations: Not on file    Relationship status: Not on file  . Intimate partner violence:    Fear of current or ex partner: Not on file    Emotionally abused: Not on file    Physically abused: Not on file    Forced sexual activity: Not on file  Other Topics Concern  . Not on file  Social History Narrative   Patient continues to smoke.     Review of Systems  Constitutional: Positive for fatigue.  HENT: Negative.   Eyes: Negative.   Respiratory: Negative.  Negative for cough and shortness of breath.   Cardiovascular: Negative for chest pain and leg swelling.  Gastrointestinal: Negative.   Hematological:       Anemia    Vitals:   07/23/18 1003  BP: 126/74  Pulse: 84  SpO2: 96%     Physical Exam  Constitutional: He appears well-developed and well-nourished.  HENT:  Head: Normocephalic and atraumatic.  Eyes: Pupils are equal, round, and reactive to light. Conjunctivae and EOM are normal. Right eye  exhibits no discharge. Left eye exhibits no discharge.  Neck: Normal range of motion. Neck supple. No thyromegaly present.  Cardiovascular: Normal rate and regular rhythm.  Pulmonary/Chest: Effort normal and breath sounds normal. No respiratory distress. He has no wheezes. He has no rales.  Poor air movement bilaterally  Abdominal: Soft. Bowel sounds are normal. He exhibits no distension.   Data Reviewed: Polysomnogram reviewed Oximetry-no significant desaturations  CT scan of the chest multiple nodules-stable, emphysema  Assessment:  .  Nocturnal desaturations -This was not confirmed on oximetry  .  Coronary artery disease  .  Chronic obstructive pulmonary disease -Severity is unclear -He is not short of breath with moderate to significant exertion -He has no chronic cough or significant symptoms  .  Smoker -Continues to smoke a pack of cigarettes a day -The risk to his vascular structures from continuing to smoke was discussed -He is aware of the risk and will decide on when he wants to quit smoking  -Multiple lung nodules on CT -Stable from previous, needs follow-up CT in a year   he does have extensive comorbidities, quitting smoking is paramount to keeping his health   Plan/Recommendations:  No significant desaturations with nocturnal oximetry  Follow-up CT in about a year  Smoking cessation counseling was discussed extensively  We will repeat pulmonary function study to ascertain severity of obstructive lung disease -This is pending at present  I will see him back in about 3 months  His respiratory status appears stable at the present time No recent exacerbation Appears to be functioning well with rare use of rescue inhaler The severity of his obstructive disease is unclear at the present time-we will follow-up with his full pulmonary function study -A full study to assess diffusing capacity and also lung volumes  Importance of quitting smoking  discussed   Sherrilyn Rist MD [facility] 07/23/2018, 10:35 AM  CC: [name], MD

## 2018-07-28 ENCOUNTER — Encounter (HOSPITAL_COMMUNITY)
Admission: RE | Admit: 2018-07-28 | Discharge: 2018-07-28 | Disposition: A | Discharge status: Home / Self Care | Payer: Medicare Other | Source: Ambulatory Visit | Attending: Nephrology | Admitting: Nephrology

## 2018-07-28 ENCOUNTER — Encounter (HOSPITAL_COMMUNITY): Payer: Self-pay

## 2018-07-28 DIAGNOSIS — D509 Iron deficiency anemia, unspecified: Secondary | ICD-10-CM | POA: Insufficient documentation

## 2018-07-28 MED ORDER — SODIUM CHLORIDE 0.9 % IV SOLN
510.0000 mg | Freq: Once | INTRAVENOUS | Status: AC
  Administered 2018-07-28: 510 mg via INTRAVENOUS
  Filled 2018-07-28: qty 17

## 2018-07-28 MED ORDER — SODIUM CHLORIDE 0.9 % IV SOLN
Freq: Once | INTRAVENOUS | Status: AC
  Administered 2018-07-28: 14:00:00 via INTRAVENOUS

## 2018-07-28 NOTE — Discharge Instructions (Signed)
Epoetin Alfa injection °What is this medicine? °EPOETIN ALFA (e POE e tin AL fa) helps your body make more red blood cells. This medicine is used to treat anemia caused by chronic kidney disease, cancer chemotherapy, or HIV-therapy. It may also be used before surgery if you have anemia. °This medicine may be used for other purposes; ask your health care provider or pharmacist if you have questions. °COMMON BRAND NAME(S): Epogen, Procrit, Retacrit °What should I tell my health care provider before I take this medicine? °They need to know if you have any of these conditions: °-cancer °-heart disease °-high blood pressure °-history of blood clots °-history of stroke °-low levels of folate, iron, or vitamin B12 in the blood °-seizures °-an unusual or allergic reaction to erythropoietin, albumin, benzyl alcohol, hamster proteins, other medicines, foods, dyes, or preservatives °-pregnant or trying to get pregnant °-breast-feeding °How should I use this medicine? °This medicine is for injection into a vein or under the skin. It is usually given by a health care professional in a hospital or clinic setting. °If you get this medicine at home, you will be taught how to prepare and give this medicine. Use exactly as directed. Take your medicine at regular intervals. Do not take your medicine more often than directed. °It is important that you put your used needles and syringes in a special sharps container. Do not put them in a trash can. If you do not have a sharps container, call your pharmacist or healthcare provider to get one. °A special MedGuide will be given to you by the pharmacist with each prescription and refill. Be sure to read this information carefully each time. °Talk to your pediatrician regarding the use of this medicine in children. While this drug may be prescribed for selected conditions, precautions do apply. °Overdosage: If you think you have taken too much of this medicine contact a poison control center  or emergency room at once. °NOTE: This medicine is only for you. Do not share this medicine with others. °What if I miss a dose? °If you miss a dose, take it as soon as you can. If it is almost time for your next dose, take only that dose. Do not take double or extra doses. °What may interact with this medicine? °Interactions have not been studied. °This list may not describe all possible interactions. Give your health care provider a list of all the medicines, herbs, non-prescription drugs, or dietary supplements you use. Also tell them if you smoke, drink alcohol, or use illegal drugs. Some items may interact with your medicine. °What should I watch for while using this medicine? °Your condition will be monitored carefully while you are receiving this medicine. °You may need blood work done while you are taking this medicine. °This medicine may cause a decrease in vitamin B6. You should make sure that you get enough vitamin B6 while you are taking this medicine. Discuss the foods you eat and the vitamins you take with your health care professional. °What side effects may I notice from receiving this medicine? °Side effects that you should report to your doctor or health care professional as soon as possible: °-allergic reactions like skin rash, itching or hives, swelling of the face, lips, or tongue °-seizures °-signs and symptoms of a blood clot such as breathing problems; changes in vision; chest pain; severe, sudden headache; pain, swelling, warmth in the leg; trouble speaking; sudden numbness or weakness of the face, arm or leg °-signs and symptoms of a stroke   like changes in vision; confusion; trouble speaking or understanding; severe headaches; sudden numbness or weakness of the face, arm or leg; trouble walking; dizziness; loss of balance or coordination °Side effects that usually do not require medical attention (report to your doctor or health care professional if they continue or are  bothersome): °-chills °-cough °-dizziness °-fever °-headaches °-joint pain °-muscle cramps °-muscle pain °-nausea, vomiting °-pain, redness, or irritation at site where injected °This list may not describe all possible side effects. Call your doctor for medical advice about side effects. You may report side effects to FDA at 1-800-FDA-1088. °Where should I keep my medicine? °Keep out of the reach of children. °Store in a refrigerator between 2 and 8 degrees C (36 and 46 degrees F). Do not freeze or shake. Throw away any unused portion if using a single-dose vial. Multi-dose vials can be kept in the refrigerator for up to 21 days after the initial dose. Throw away unused medicine. °NOTE: This sheet is a summary. It may not cover all possible information. If you have questions about this medicine, talk to your doctor, pharmacist, or health care provider. °© 2019 Elsevier/Gold Standard (2016-12-13 08:35:19) °Ferumoxytol injection °What is this medicine? °FERUMOXYTOL is an iron complex. Iron is used to make healthy red blood cells, which carry oxygen and nutrients throughout the body. This medicine is used to treat iron deficiency anemia. °This medicine may be used for other purposes; ask your health care provider or pharmacist if you have questions. °COMMON BRAND NAME(S): Feraheme °What should I tell my health care provider before I take this medicine? °They need to know if you have any of these conditions: °-anemia not caused by low iron levels °-high levels of iron in the blood °-magnetic resonance imaging (MRI) test scheduled °-an unusual or allergic reaction to iron, other medicines, foods, dyes, or preservatives °-pregnant or trying to get pregnant °-breast-feeding °How should I use this medicine? °This medicine is for injection into a vein. It is given by a health care professional in a hospital or clinic setting. °Talk to your pediatrician regarding the use of this medicine in children. Special care may be  needed. °Overdosage: If you think you have taken too much of this medicine contact a poison control center or emergency room at once. °NOTE: This medicine is only for you. Do not share this medicine with others. °What if I miss a dose? °It is important not to miss your dose. Call your doctor or health care professional if you are unable to keep an appointment. °What may interact with this medicine? °This medicine may interact with the following medications: °-other iron products °This list may not describe all possible interactions. Give your health care provider a list of all the medicines, herbs, non-prescription drugs, or dietary supplements you use. Also tell them if you smoke, drink alcohol, or use illegal drugs. Some items may interact with your medicine. °What should I watch for while using this medicine? °Visit your doctor or healthcare professional regularly. Tell your doctor or healthcare professional if your symptoms do not start to get better or if they get worse. You may need blood work done while you are taking this medicine. °You may need to follow a special diet. Talk to your doctor. Foods that contain iron include: whole grains/cereals, dried fruits, beans, or peas, leafy green vegetables, and organ meats (liver, kidney). °What side effects may I notice from receiving this medicine? °Side effects that you should report to your doctor or health care   professional as soon as possible: °-allergic reactions like skin rash, itching or hives, swelling of the face, lips, or tongue °-breathing problems °-changes in blood pressure °-feeling faint or lightheaded, falls °-fever or chills °-flushing, sweating, or hot feelings °-swelling of the ankles or feet °Side effects that usually do not require medical attention (report to your doctor or health care professional if they continue or are bothersome): °-diarrhea °-headache °-nausea, vomiting °-stomach pain °This list may not describe all possible side effects.  Call your doctor for medical advice about side effects. You may report side effects to FDA at 1-800-FDA-1088. °Where should I keep my medicine? °This drug is given in a hospital or clinic and will not be stored at home. °NOTE: This sheet is a summary. It may not cover all possible information. If you have questions about this medicine, talk to your doctor, pharmacist, or health care provider. °© 2019 Elsevier/Gold Standard (2016-06-24 20:21:10) ° °

## 2018-07-31 ENCOUNTER — Other Ambulatory Visit: Payer: Self-pay

## 2018-07-31 ENCOUNTER — Ambulatory Visit (INDEPENDENT_AMBULATORY_CARE_PROVIDER_SITE_OTHER): Payer: Medicare Other | Admitting: Pulmonary Disease

## 2018-07-31 DIAGNOSIS — N184 Chronic kidney disease, stage 4 (severe): Secondary | ICD-10-CM | POA: Diagnosis not present

## 2018-07-31 DIAGNOSIS — D3A00 Benign carcinoid tumor of unspecified site: Secondary | ICD-10-CM | POA: Diagnosis not present

## 2018-07-31 DIAGNOSIS — K219 Gastro-esophageal reflux disease without esophagitis: Secondary | ICD-10-CM | POA: Diagnosis not present

## 2018-07-31 DIAGNOSIS — K5909 Other constipation: Secondary | ICD-10-CM | POA: Diagnosis not present

## 2018-07-31 DIAGNOSIS — I480 Paroxysmal atrial fibrillation: Secondary | ICD-10-CM | POA: Diagnosis not present

## 2018-07-31 DIAGNOSIS — J449 Chronic obstructive pulmonary disease, unspecified: Secondary | ICD-10-CM

## 2018-07-31 DIAGNOSIS — R1084 Generalized abdominal pain: Secondary | ICD-10-CM | POA: Diagnosis not present

## 2018-07-31 DIAGNOSIS — Z7901 Long term (current) use of anticoagulants: Secondary | ICD-10-CM | POA: Diagnosis not present

## 2018-07-31 DIAGNOSIS — J4489 Other specified chronic obstructive pulmonary disease: Secondary | ICD-10-CM

## 2018-07-31 DIAGNOSIS — Z86010 Personal history of colonic polyps: Secondary | ICD-10-CM | POA: Diagnosis not present

## 2018-07-31 DIAGNOSIS — E1142 Type 2 diabetes mellitus with diabetic polyneuropathy: Secondary | ICD-10-CM | POA: Diagnosis not present

## 2018-07-31 DIAGNOSIS — D509 Iron deficiency anemia, unspecified: Secondary | ICD-10-CM | POA: Diagnosis not present

## 2018-07-31 DIAGNOSIS — Z794 Long term (current) use of insulin: Secondary | ICD-10-CM | POA: Diagnosis not present

## 2018-07-31 DIAGNOSIS — Z950 Presence of cardiac pacemaker: Secondary | ICD-10-CM | POA: Diagnosis not present

## 2018-07-31 LAB — PULMONARY FUNCTION TEST
DL/VA % pred: 75 %
DL/VA: 3.05 ml/min/mmHg/L
DLCO unc % pred: 62 %
DLCO unc: 20.08 ml/min/mmHg
FEF 25-75 Post: 1.52 L/sec
FEF 25-75 Pre: 1.82 L/sec
FEF2575-%Change-Post: -16 %
FEF2575-%Pred-Post: 46 %
FEF2575-%Pred-Pre: 55 %
FEV1-%Change-Post: -3 %
FEV1-%Pred-Post: 58 %
FEV1-%Pred-Pre: 60 %
FEV1-Post: 2.47 L
FEV1-Pre: 2.55 L
FEV1FVC-%Change-Post: -6 %
FEV1FVC-%Pred-Pre: 97 %
FEV6-%Change-Post: 1 %
FEV6-%Pred-Post: 65 %
FEV6-%Pred-Pre: 64 %
FEV6-Post: 3.56 L
FEV6-Pre: 3.49 L
FEV6FVC-%Change-Post: -1 %
FEV6FVC-%Pred-Post: 103 %
FEV6FVC-%Pred-Pre: 104 %
FVC-%Change-Post: 3 %
FVC-%Pred-Post: 63 %
FVC-%Pred-Pre: 61 %
FVC-Post: 3.61 L
FVC-Pre: 3.49 L
Post FEV1/FVC ratio: 68 %
Post FEV6/FVC ratio: 99 %
Pre FEV1/FVC ratio: 73 %
Pre FEV6/FVC Ratio: 100 %
RV % pred: 129 %
RV: 3.45 L
TLC % pred: 85 %
TLC: 7.06 L

## 2018-07-31 NOTE — Progress Notes (Signed)
PFT done today. 

## 2018-08-04 ENCOUNTER — Other Ambulatory Visit: Payer: Self-pay

## 2018-08-04 ENCOUNTER — Encounter (HOSPITAL_COMMUNITY): Payer: Self-pay

## 2018-08-04 ENCOUNTER — Encounter (HOSPITAL_COMMUNITY)
Admission: RE | Admit: 2018-08-04 | Discharge: 2018-08-04 | Disposition: A | Discharge status: Home / Self Care | Payer: Medicare Other | Source: Ambulatory Visit | Attending: Nephrology | Admitting: Nephrology

## 2018-08-04 DIAGNOSIS — D509 Iron deficiency anemia, unspecified: Secondary | ICD-10-CM | POA: Diagnosis not present

## 2018-08-04 MED ORDER — SODIUM CHLORIDE 0.9 % IV SOLN
Freq: Once | INTRAVENOUS | Status: AC
  Administered 2018-08-04: 13:00:00 via INTRAVENOUS

## 2018-08-04 MED ORDER — SODIUM CHLORIDE 0.9 % IV SOLN
510.0000 mg | Freq: Once | INTRAVENOUS | Status: AC
  Administered 2018-08-04: 510 mg via INTRAVENOUS
  Filled 2018-08-04: qty 17

## 2018-08-05 ENCOUNTER — Ambulatory Visit (INDEPENDENT_AMBULATORY_CARE_PROVIDER_SITE_OTHER): Payer: Medicare Other | Admitting: *Deleted

## 2018-08-05 DIAGNOSIS — I495 Sick sinus syndrome: Secondary | ICD-10-CM

## 2018-08-05 LAB — CUP PACEART REMOTE DEVICE CHECK
Battery Remaining Longevity: 174 mo
Battery Remaining Percentage: 100 %
Brady Statistic RA Percent Paced: 34 %
Brady Statistic RV Percent Paced: 6 %
Date Time Interrogation Session: 20200318093300
Implantable Lead Implant Date: 20190514
Implantable Lead Implant Date: 20190514
Implantable Lead Location: 753859
Implantable Lead Location: 753860
Implantable Lead Model: 7741
Implantable Lead Model: 7742
Implantable Lead Serial Number: 1015225
Implantable Lead Serial Number: 1020907
Implantable Pulse Generator Implant Date: 20190514
Lead Channel Impedance Value: 663 Ohm
Lead Channel Impedance Value: 788 Ohm
Lead Channel Pacing Threshold Amplitude: 0.6 V
Lead Channel Pacing Threshold Pulse Width: 0.4 ms
Lead Channel Setting Pacing Amplitude: 2 V
Lead Channel Setting Pacing Amplitude: 3.5 V
Lead Channel Setting Pacing Pulse Width: 1 ms
Lead Channel Setting Sensing Sensitivity: 1.5 mV
Pulse Gen Serial Number: 832937

## 2018-08-07 ENCOUNTER — Encounter (HOSPITAL_COMMUNITY): Payer: Medicare Other

## 2018-08-07 ENCOUNTER — Encounter (HOSPITAL_COMMUNITY): Admission: RE | Admit: 2018-08-07 | Payer: Medicare Other | Source: Ambulatory Visit

## 2018-08-11 ENCOUNTER — Other Ambulatory Visit (HOSPITAL_COMMUNITY): Payer: Medicare Other

## 2018-08-11 ENCOUNTER — Ambulatory Visit (HOSPITAL_COMMUNITY): Payer: Medicare Other

## 2018-08-12 ENCOUNTER — Other Ambulatory Visit: Payer: Self-pay | Admitting: Cardiology

## 2018-08-13 ENCOUNTER — Encounter: Payer: Self-pay | Admitting: Cardiology

## 2018-08-13 ENCOUNTER — Other Ambulatory Visit: Payer: Self-pay | Admitting: Student

## 2018-08-13 ENCOUNTER — Other Ambulatory Visit: Payer: Self-pay | Admitting: *Deleted

## 2018-08-13 ENCOUNTER — Telehealth: Payer: Self-pay | Admitting: Pulmonary Disease

## 2018-08-13 ENCOUNTER — Other Ambulatory Visit: Payer: Self-pay | Admitting: Cardiology

## 2018-08-13 MED ORDER — APIXABAN 5 MG PO TABS: 5.0000 mg | ORAL_TABLET | Freq: Two times a day (BID) | ORAL | 3 refills | Status: DC

## 2018-08-13 NOTE — Telephone Encounter (Signed)
Moderate obstructive airway disease  No significant bronchodilator response  Continue current treatment

## 2018-08-13 NOTE — Progress Notes (Signed)
Remote pacemaker transmission.   

## 2018-08-13 NOTE — Telephone Encounter (Signed)
Patient would like the results of the PFT he had recently. Dr. Ander Slade please advise.

## 2018-08-14 NOTE — Telephone Encounter (Signed)
Patient would like his pft result to be faxed to the kidney transplant team he will call back with this number.

## 2018-08-18 NOTE — Telephone Encounter (Signed)
I have faxed pt's PFT in attn to [facility] to [facility]. Called and spoke with pt letting him know this had been done. Pt expressed understanding. Nothing further needed.

## 2018-08-18 NOTE — Telephone Encounter (Signed)
[facility] numbers, CB is [phone] Fax number [phone]. Tia Alert

## 2018-08-19 DIAGNOSIS — J438 Other emphysema: Secondary | ICD-10-CM | POA: Diagnosis not present

## 2018-08-19 DIAGNOSIS — I4891 Unspecified atrial fibrillation: Secondary | ICD-10-CM | POA: Diagnosis not present

## 2018-08-19 DIAGNOSIS — I10 Essential (primary) hypertension: Secondary | ICD-10-CM | POA: Diagnosis not present

## 2018-08-19 DIAGNOSIS — E6609 Other obesity due to excess calories: Secondary | ICD-10-CM | POA: Diagnosis not present

## 2018-08-19 DIAGNOSIS — Z6831 Body mass index (BMI) 31.0-31.9, adult: Secondary | ICD-10-CM | POA: Diagnosis not present

## 2018-08-19 DIAGNOSIS — E1122 Type 2 diabetes mellitus with diabetic chronic kidney disease: Secondary | ICD-10-CM | POA: Diagnosis not present

## 2018-08-19 DIAGNOSIS — N186 End stage renal disease: Secondary | ICD-10-CM | POA: Diagnosis not present

## 2018-08-19 DIAGNOSIS — E782 Mixed hyperlipidemia: Secondary | ICD-10-CM | POA: Diagnosis not present

## 2018-08-19 DIAGNOSIS — N529 Male erectile dysfunction, unspecified: Secondary | ICD-10-CM | POA: Diagnosis not present

## 2018-08-19 DIAGNOSIS — K219 Gastro-esophageal reflux disease without esophagitis: Secondary | ICD-10-CM | POA: Diagnosis not present

## 2018-08-19 DIAGNOSIS — D649 Anemia, unspecified: Secondary | ICD-10-CM | POA: Diagnosis not present

## 2018-08-21 ENCOUNTER — Other Ambulatory Visit: Payer: Self-pay | Admitting: Cardiology

## 2018-08-24 ENCOUNTER — Telehealth: Payer: Self-pay

## 2018-08-24 MED ORDER — ATORVASTATIN CALCIUM 40 MG PO TABS: 40.0000 mg | ORAL_TABLET | Freq: Every evening | ORAL | 2 refills | Status: DC

## 2018-08-24 NOTE — Telephone Encounter (Signed)
Spoke with pharmacist at [facility], they will fill atorvastatin for patient, it will be a tier 1, $3 co-pay

## 2018-08-24 NOTE — Telephone Encounter (Signed)
-----   Message from Arnoldo Lenis, MD sent at 08/24/2018  9:27 AM EDT ----- Regarding: RE: cost of crestor Stopping crestor and starting atorvastatin 40mg  daily is fine  J BrancH MD ----- Message ----- From: Bernita Raisin, RN Sent: 08/21/2018   9:21 AM EDT To: Arnoldo Lenis, MD Subject: cost of crestor                                See pt cost for crestor, do you want to switch     atorvastatin (LIPITOR) 40 MG tablet      Changed from: rosuvastatin (CRESTOR) 20 MG tablet     Sig: TAKE ONE TABLET BY MOUTH EVERY EVENING  Disp:  90 tablet (Pharmacy requested: 90 each)  Refills:  3  Start: 08/21/2018  Class: Normal  Non-formulary  Last ordered: 1 week ago by Arnoldo Lenis, MD   Rx #: [id number]  Pharmacy comment: Rosuvastatin is not a preferred drug for patient's insurance plan and the copay for 90 day supply is over $100. Atorvastatin is the preferred option and costs around $3. Please call if you have any questions. Thanks.   To be filled at: [place], [place]

## 2018-08-26 ENCOUNTER — Telehealth: Payer: Self-pay | Admitting: Pulmonary Disease

## 2018-08-26 NOTE — Telephone Encounter (Signed)
Spoke with pt to reschedule his f/u appt with Dr Ander Slade.  Recall placed.   Pt states that he is waiting to get kidney transplant at [facility].  We had faxed his PFT results to them but pt was told that they needed more info.  I left a message for Champ Mungo at [facility] at [phone] to see what else we need to send her.  Her fax is [phone]. Will await her return call.

## 2018-08-27 ENCOUNTER — Telehealth: Payer: Self-pay

## 2018-08-27 NOTE — Telephone Encounter (Signed)
Received phone call from [facility] with [facility]. She states they need OV notes from 03/05 faxed to [phone]. OV notes printed and faxed. Nothing further is needed at this time.

## 2018-09-08 ENCOUNTER — Encounter (HOSPITAL_COMMUNITY)
Admission: RE | Admit: 2018-09-08 | Discharge: 2018-09-08 | Disposition: A | Discharge status: Home / Self Care | Payer: Medicare Other | Source: Ambulatory Visit | Attending: Nephrology | Admitting: Nephrology

## 2018-09-08 ENCOUNTER — Other Ambulatory Visit: Payer: Self-pay

## 2018-09-08 ENCOUNTER — Encounter (HOSPITAL_COMMUNITY): Payer: Self-pay

## 2018-09-08 DIAGNOSIS — D631 Anemia in chronic kidney disease: Secondary | ICD-10-CM | POA: Diagnosis not present

## 2018-09-08 DIAGNOSIS — N184 Chronic kidney disease, stage 4 (severe): Secondary | ICD-10-CM | POA: Diagnosis not present

## 2018-09-08 LAB — RENAL FUNCTION PANEL
Albumin: 3.7 g/dL (ref 3.5–5.0)
Anion gap: 11 (ref 5–15)
BUN: 69 mg/dL — ABNORMAL HIGH (ref 8–23)
CO2: 24 mmol/L (ref 22–32)
Calcium: 8.7 mg/dL — ABNORMAL LOW (ref 8.9–10.3)
Chloride: 101 mmol/L (ref 98–111)
Creatinine, Ser: 4.8 mg/dL — ABNORMAL HIGH (ref 0.61–1.24)
GFR calc Af Amer: 14 mL/min — ABNORMAL LOW (ref >60)
GFR calc non Af Amer: 12 mL/min — ABNORMAL LOW (ref >60)
Glucose, Bld: 104 mg/dL — ABNORMAL HIGH (ref 70–99)
Phosphorus: 5.1 mg/dL — ABNORMAL HIGH (ref 2.5–4.6)
Potassium: 3.7 mmol/L (ref 3.5–5.1)
Sodium: 136 mmol/L (ref 135–145)

## 2018-09-08 LAB — IRON AND TIBC
Iron: 19 ug/dL — ABNORMAL LOW (ref 45–182)
Saturation Ratios: 5 % — ABNORMAL LOW (ref 17.9–39.5)
TIBC: 363 ug/dL (ref 250–450)
UIBC: 344 ug/dL

## 2018-09-08 LAB — POCT HEMOGLOBIN-HEMACUE: Hemoglobin: 9.5 g/dL — ABNORMAL LOW (ref 13.0–17.0)

## 2018-09-08 LAB — PROTEIN / CREATININE RATIO, URINE
Creatinine, Urine: 33.79 mg/dL
Protein Creatinine Ratio: 4.23 mg/mg{Cre} — ABNORMAL HIGH (ref 0.00–0.15)
Total Protein, Urine: 143 mg/dL

## 2018-09-08 LAB — HEMOGLOBIN AND HEMATOCRIT, BLOOD
HCT: 29.4 % — ABNORMAL LOW (ref 39.0–52.0)
Hemoglobin: 9 g/dL — ABNORMAL LOW (ref 13.0–17.0)

## 2018-09-08 LAB — FERRITIN: Ferritin: 18 ng/mL — ABNORMAL LOW (ref 24–336)

## 2018-09-08 MED ORDER — EPOETIN ALFA 4000 UNIT/ML IJ SOLN
4000.0000 [IU] | Freq: Once | INTRAMUSCULAR | Status: AC
  Administered 2018-09-08: 4000 [IU] via SUBCUTANEOUS

## 2018-09-08 MED ORDER — EPOETIN ALFA 4000 UNIT/ML IJ SOLN
INTRAMUSCULAR | Status: AC
  Filled 2018-09-08: qty 1

## 2018-09-09 LAB — PTH, INTACT AND CALCIUM
Calcium, Total (PTH): 9.1 mg/dL (ref 8.6–10.2)
PTH: 92 pg/mL — ABNORMAL HIGH (ref 15–65)

## 2018-09-09 LAB — VITAMIN D 25 HYDROXY (VIT D DEFICIENCY, FRACTURES): Vit D, 25-Hydroxy: 39.4 ng/mL (ref 30.0–100.0)

## 2018-09-14 DIAGNOSIS — E1142 Type 2 diabetes mellitus with diabetic polyneuropathy: Secondary | ICD-10-CM | POA: Diagnosis not present

## 2018-09-14 DIAGNOSIS — L851 Acquired keratosis [keratoderma] palmaris et plantaris: Secondary | ICD-10-CM | POA: Diagnosis not present

## 2018-09-22 ENCOUNTER — Encounter (HOSPITAL_COMMUNITY)
Admission: RE | Admit: 2018-09-22 | Discharge: 2018-09-22 | Disposition: A | Discharge status: Home / Self Care | Payer: Medicare Other | Source: Ambulatory Visit | Attending: Nephrology | Admitting: Nephrology

## 2018-09-22 ENCOUNTER — Other Ambulatory Visit: Payer: Self-pay

## 2018-09-22 ENCOUNTER — Encounter (HOSPITAL_COMMUNITY): Payer: Self-pay

## 2018-09-22 DIAGNOSIS — N184 Chronic kidney disease, stage 4 (severe): Secondary | ICD-10-CM | POA: Diagnosis not present

## 2018-09-22 DIAGNOSIS — D631 Anemia in chronic kidney disease: Secondary | ICD-10-CM | POA: Diagnosis not present

## 2018-09-22 LAB — POCT HEMOGLOBIN-HEMACUE: Hemoglobin: 7.8 g/dL — ABNORMAL LOW (ref 13.0–17.0)

## 2018-09-22 MED ORDER — EPOETIN ALFA 4000 UNIT/ML IJ SOLN
4000.0000 [IU] | Freq: Once | INTRAMUSCULAR | Status: AC
  Administered 2018-09-22: 4000 [IU] via SUBCUTANEOUS
  Filled 2018-09-22: qty 1

## 2018-09-23 NOTE — Progress Notes (Signed)
Spoke to Dr. Florentina Addison nurse, [name] regarding decreased energy, Hgb 7.5 and 5% iron saturation.  Per order will schedule for Feraheme 2 weeks apart.  Patient made aware and will begin tomorrow 09/24/18.

## 2018-09-24 ENCOUNTER — Other Ambulatory Visit: Payer: Self-pay

## 2018-09-24 ENCOUNTER — Encounter (HOSPITAL_COMMUNITY)
Admission: RE | Admit: 2018-09-24 | Discharge: 2018-09-24 | Disposition: A | Discharge status: Home / Self Care | Payer: Medicare Other | Source: Ambulatory Visit | Attending: Nephrology | Admitting: Nephrology

## 2018-09-24 ENCOUNTER — Encounter (HOSPITAL_COMMUNITY): Payer: Self-pay

## 2018-09-24 DIAGNOSIS — N184 Chronic kidney disease, stage 4 (severe): Secondary | ICD-10-CM | POA: Diagnosis not present

## 2018-09-24 DIAGNOSIS — D631 Anemia in chronic kidney disease: Secondary | ICD-10-CM | POA: Diagnosis not present

## 2018-09-24 MED ORDER — SODIUM CHLORIDE 0.9 % IV SOLN
Freq: Once | INTRAVENOUS | Status: AC
  Administered 2018-09-24: 11:00:00 via INTRAVENOUS

## 2018-09-24 MED ORDER — SODIUM CHLORIDE 0.9 % IV SOLN
510.0000 mg | Freq: Once | INTRAVENOUS | Status: AC
  Administered 2018-09-24: 510 mg via INTRAVENOUS
  Filled 2018-09-24: qty 17

## 2018-09-29 DIAGNOSIS — D638 Anemia in other chronic diseases classified elsewhere: Secondary | ICD-10-CM | POA: Diagnosis not present

## 2018-09-29 DIAGNOSIS — N185 Chronic kidney disease, stage 5: Secondary | ICD-10-CM | POA: Diagnosis not present

## 2018-09-29 DIAGNOSIS — R809 Proteinuria, unspecified: Secondary | ICD-10-CM | POA: Diagnosis not present

## 2018-09-29 DIAGNOSIS — E1121 Type 2 diabetes mellitus with diabetic nephropathy: Secondary | ICD-10-CM | POA: Diagnosis not present

## 2018-09-29 DIAGNOSIS — E876 Hypokalemia: Secondary | ICD-10-CM | POA: Diagnosis not present

## 2018-10-01 ENCOUNTER — Other Ambulatory Visit: Payer: Self-pay

## 2018-10-01 ENCOUNTER — Encounter (HOSPITAL_COMMUNITY)
Admission: RE | Admit: 2018-10-01 | Discharge: 2018-10-01 | Disposition: A | Discharge status: Home / Self Care | Payer: Medicare Other | Source: Ambulatory Visit | Attending: Nephrology | Admitting: Nephrology

## 2018-10-01 DIAGNOSIS — D631 Anemia in chronic kidney disease: Secondary | ICD-10-CM | POA: Diagnosis not present

## 2018-10-01 DIAGNOSIS — N184 Chronic kidney disease, stage 4 (severe): Secondary | ICD-10-CM | POA: Diagnosis not present

## 2018-10-01 MED ORDER — SODIUM CHLORIDE 0.9 % IV SOLN
510.0000 mg | INTRAVENOUS | Status: DC
  Administered 2018-10-01: 510 mg via INTRAVENOUS
  Filled 2018-10-01: qty 17

## 2018-10-01 MED ORDER — EPOETIN ALFA 4000 UNIT/ML IJ SOLN: 4000.0000 [IU] | Freq: Once | INTRAMUSCULAR | Status: DC

## 2018-10-01 MED ORDER — SODIUM CHLORIDE 0.9 % IV SOLN
INTRAVENOUS | Status: DC
  Administered 2018-10-01: 13:00:00 via INTRAVENOUS

## 2018-10-06 ENCOUNTER — Encounter (HOSPITAL_COMMUNITY)
Admission: RE | Admit: 2018-10-06 | Discharge: 2018-10-06 | Disposition: A | Discharge status: Home / Self Care | Payer: Medicare Other | Source: Ambulatory Visit | Attending: Nephrology | Admitting: Nephrology

## 2018-10-06 ENCOUNTER — Other Ambulatory Visit: Payer: Self-pay

## 2018-10-06 ENCOUNTER — Encounter (HOSPITAL_COMMUNITY): Payer: Self-pay

## 2018-10-06 DIAGNOSIS — D631 Anemia in chronic kidney disease: Secondary | ICD-10-CM | POA: Diagnosis not present

## 2018-10-06 DIAGNOSIS — N184 Chronic kidney disease, stage 4 (severe): Secondary | ICD-10-CM | POA: Diagnosis not present

## 2018-10-06 LAB — POCT HEMOGLOBIN-HEMACUE: Hemoglobin: 8.8 g/dL — ABNORMAL LOW (ref 13.0–17.0)

## 2018-10-06 MED ORDER — EPOETIN ALFA 4000 UNIT/ML IJ SOLN
4000.0000 [IU] | INTRAMUSCULAR | Status: DC
  Administered 2018-10-06: 4000 [IU] via SUBCUTANEOUS

## 2018-10-06 MED ORDER — EPOETIN ALFA 4000 UNIT/ML IJ SOLN
INTRAMUSCULAR | Status: AC
  Filled 2018-10-06: qty 1

## 2018-10-07 DIAGNOSIS — R195 Other fecal abnormalities: Secondary | ICD-10-CM | POA: Diagnosis not present

## 2018-10-07 DIAGNOSIS — D124 Benign neoplasm of descending colon: Secondary | ICD-10-CM | POA: Diagnosis not present

## 2018-10-07 DIAGNOSIS — K5521 Angiodysplasia of colon with hemorrhage: Secondary | ICD-10-CM | POA: Diagnosis not present

## 2018-10-09 DIAGNOSIS — Z79899 Other long term (current) drug therapy: Secondary | ICD-10-CM | POA: Diagnosis not present

## 2018-10-09 DIAGNOSIS — D509 Iron deficiency anemia, unspecified: Secondary | ICD-10-CM | POA: Diagnosis not present

## 2018-10-09 DIAGNOSIS — N184 Chronic kidney disease, stage 4 (severe): Secondary | ICD-10-CM | POA: Diagnosis not present

## 2018-10-09 DIAGNOSIS — I10 Essential (primary) hypertension: Secondary | ICD-10-CM | POA: Diagnosis not present

## 2018-10-13 DIAGNOSIS — D124 Benign neoplasm of descending colon: Secondary | ICD-10-CM | POA: Diagnosis not present

## 2018-10-14 ENCOUNTER — Emergency Department (HOSPITAL_COMMUNITY)
Admission: EM | Admit: 2018-10-14 | Discharge: 2018-10-14 | Disposition: A | Discharge status: Home / Self Care | Payer: Medicare Other | Attending: Emergency Medicine | Admitting: Emergency Medicine

## 2018-10-14 ENCOUNTER — Other Ambulatory Visit: Payer: Self-pay

## 2018-10-14 ENCOUNTER — Encounter (HOSPITAL_COMMUNITY): Payer: Self-pay

## 2018-10-14 DIAGNOSIS — K625 Hemorrhage of anus and rectum: Secondary | ICD-10-CM | POA: Diagnosis not present

## 2018-10-14 DIAGNOSIS — I739 Peripheral vascular disease, unspecified: Secondary | ICD-10-CM | POA: Insufficient documentation

## 2018-10-14 DIAGNOSIS — N183 Chronic kidney disease, stage 3 (moderate): Secondary | ICD-10-CM | POA: Diagnosis not present

## 2018-10-14 DIAGNOSIS — R42 Dizziness and giddiness: Secondary | ICD-10-CM | POA: Diagnosis not present

## 2018-10-14 DIAGNOSIS — Z951 Presence of aortocoronary bypass graft: Secondary | ICD-10-CM | POA: Insufficient documentation

## 2018-10-14 DIAGNOSIS — E1122 Type 2 diabetes mellitus with diabetic chronic kidney disease: Secondary | ICD-10-CM | POA: Insufficient documentation

## 2018-10-14 DIAGNOSIS — Z7901 Long term (current) use of anticoagulants: Secondary | ICD-10-CM | POA: Insufficient documentation

## 2018-10-14 DIAGNOSIS — Z9889 Other specified postprocedural states: Secondary | ICD-10-CM | POA: Diagnosis not present

## 2018-10-14 DIAGNOSIS — Z79899 Other long term (current) drug therapy: Secondary | ICD-10-CM | POA: Insufficient documentation

## 2018-10-14 DIAGNOSIS — Z87891 Personal history of nicotine dependence: Secondary | ICD-10-CM | POA: Insufficient documentation

## 2018-10-14 DIAGNOSIS — I129 Hypertensive chronic kidney disease with stage 1 through stage 4 chronic kidney disease, or unspecified chronic kidney disease: Secondary | ICD-10-CM | POA: Diagnosis not present

## 2018-10-14 DIAGNOSIS — J449 Chronic obstructive pulmonary disease, unspecified: Secondary | ICD-10-CM | POA: Insufficient documentation

## 2018-10-14 DIAGNOSIS — Z794 Long term (current) use of insulin: Secondary | ICD-10-CM | POA: Insufficient documentation

## 2018-10-14 LAB — CBC WITH DIFFERENTIAL/PLATELET
Abs Immature Granulocytes: 0.01 10*3/uL (ref 0.00–0.07)
Basophils Absolute: 0.1 10*3/uL (ref 0.0–0.1)
Basophils Relative: 1 %
Eosinophils Absolute: 0.1 10*3/uL (ref 0.0–0.5)
Eosinophils Relative: 2 %
HCT: 28.7 % — ABNORMAL LOW (ref 39.0–52.0)
Hemoglobin: 8.8 g/dL — ABNORMAL LOW (ref 13.0–17.0)
Immature Granulocytes: 0 %
Lymphocytes Relative: 23 %
Lymphs Abs: 1.5 10*3/uL (ref 0.7–4.0)
MCH: 29.7 pg (ref 26.0–34.0)
MCHC: 30.7 g/dL (ref 30.0–36.0)
MCV: 97 fL (ref 80.0–100.0)
Monocytes Absolute: 0.7 10*3/uL (ref 0.1–1.0)
Monocytes Relative: 11 %
Neutro Abs: 4.1 10*3/uL (ref 1.7–7.7)
Neutrophils Relative %: 63 %
Platelets: 208 10*3/uL (ref 150–400)
RBC: 2.96 MIL/uL — ABNORMAL LOW (ref 4.22–5.81)
RDW: 20.7 % — ABNORMAL HIGH (ref 11.5–15.5)
WBC: 6.5 10*3/uL (ref 4.0–10.5)
nRBC: 0 % (ref 0.0–0.2)

## 2018-10-14 LAB — COMPREHENSIVE METABOLIC PANEL
ALT: 15 U/L (ref 0–44)
AST: 16 U/L (ref 15–41)
Albumin: 3.4 g/dL — ABNORMAL LOW (ref 3.5–5.0)
Alkaline Phosphatase: 60 U/L (ref 38–126)
Anion gap: 11 (ref 5–15)
BUN: 60 mg/dL — ABNORMAL HIGH (ref 8–23)
CO2: 23 mmol/L (ref 22–32)
Calcium: 9 mg/dL (ref 8.9–10.3)
Chloride: 102 mmol/L (ref 98–111)
Creatinine, Ser: 4.53 mg/dL — ABNORMAL HIGH (ref 0.61–1.24)
GFR calc Af Amer: 15 mL/min — ABNORMAL LOW (ref >60)
GFR calc non Af Amer: 13 mL/min — ABNORMAL LOW (ref >60)
Glucose, Bld: 92 mg/dL (ref 70–99)
Potassium: 3.9 mmol/L (ref 3.5–5.1)
Sodium: 136 mmol/L (ref 135–145)
Total Bilirubin: 0.6 mg/dL (ref 0.3–1.2)
Total Protein: 6.6 g/dL (ref 6.5–8.1)

## 2018-10-14 LAB — TYPE AND SCREEN
ABO/RH(D): O POS
Antibody Screen: NEGATIVE

## 2018-10-14 LAB — ABO/RH: ABO/RH(D): O POS

## 2018-10-14 LAB — POC OCCULT BLOOD, ED: Fecal Occult Bld: POSITIVE — AB

## 2018-10-14 NOTE — ED Notes (Signed)
ED Provider at bedside. 

## 2018-10-14 NOTE — Discharge Instructions (Addendum)
Have your hemoglobin checked 2 days from now as discussed.  Return to the ED if more consistent bleeding is occurring and you become increasingly more symptomatic.  Your hemoglobin today was 8.8 which was the same 1 week ago.  GI will likely follow-up with you as well but do not hesitate to call them if needed as well.

## 2018-10-14 NOTE — ED Triage Notes (Signed)
Pt arrives POV from home c/o rectal bleeding since last Friday. Pt denies fever or having any pain. Pt has hx of DM, HTN, "decreased kidney function", and has a pacemaker. Pt a&o x4.

## 2018-10-14 NOTE — ED Provider Notes (Addendum)
[facility]ERGENCY DEPARTMENT Provider Note   CSN: [id number] Arrival date & time: 10/14/18  1433    History   Chief Complaint Chief Complaint  Patient presents with  . Rectal Bleeding    HPI [name] is a 68 y.o. male.     The history is provided by the patient.  Rectal Bleeding  Quality:  Bright red and black and tarry Amount:  Moderate Duration:  7 days Timing:  Intermittent Chronicity:  New Context: spontaneously (recent had colonscopy last week with something "clipped")   Similar prior episodes: no   Relieved by:  Nothing Associated symptoms: dizziness and light-headedness   Associated symptoms: no abdominal pain, no fever, no loss of consciousness and no vomiting   Risk factors: anticoagulant use     Past Medical History:  Diagnosis Date  . Asthma   . Atrial flutter ([place])   . CAD (coronary artery disease)    a. s/p prior stenting of RCA in 1999 b. cath in 2003 showing moderate CAD c. NST in 2016 showing small area of ischemic and low-risk  . Chronic renal insufficiency   . COPD (chronic obstructive pulmonary disease) ([place])    with ongoing tobacco use and patient failed sham takes  . DM2 (diabetes mellitus, type 2) ([place])   . Dyslipidemia   . Edema    chronic lower extremity secondary to right heart failure and chronic venous insufficiency  . Headache   . HTN (hypertension)   . Morbid obesity ([place])   . Pneumonia   . Presence of permanent cardiac pacemaker   . 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   Marland Kitchen Reflux esophagitis    hx  . Renal insufficiency   . Sleep apnea   . Two-vessel coronary artery disease    moderate. by cath in 2003. Status post stenting of the mdi RCA November 1999 normal left ventricular ejection fraction  . Wears glasses   . Wears hearing aid    B/L    Patient Active Problem List   Diagnosis Date Noted  . 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[place])  03/18/2018  . Chronic renal insufficiency, stage 3 (moderate) ([place]) 03/18/2018  . Diffuse wheezing 12/02/2017  . OSA and COPD overlap syndrome ([place]) 12/02/2017  . Sleeps in sitting position due to orthopnea 12/02/2017  . Pacemaker 12/02/2017  . Bradycardia with 31-40 beats per minute 12/02/2017  . Coronary artery disease due to calcified coronary lesion 12/02/2017  . Insomnia due to medical condition 12/02/2017  . Snoring 12/02/2017  . Chronic diastolic CHF (congestive heart failure) ([place]) 05/10/2017  . Atypical chest pain   . Chest pain 05/09/2017  . Atrial flutter ([place]) 05/09/2017  . Acute renal failure superimposed on stage 4 chronic kidney disease ([place]) 05/09/2017  . Hypokalemia 04/05/2016  . Diabetes mellitus with nephropathy ([place]) 04/05/2016  . Hyponatremia 04/05/2016  . Hematuria 04/05/2016  . Diarrhea 04/05/2016  . Bilateral diabetic foot ulcer associated with secondary diabetes mellitus ([place]) 04/05/2016  . Streptococcal bacteremia 04/05/2016  . Cellulitis of leg, left 04/05/2016  . Sepsis ([place]) 04/04/2016  . CAD (coronary artery disease) 11/10/2015  . Bilateral lower extremity edema 07/25/2015  . Foot infection 09/27/2014  . Diabetic foot infection ([place]) 09/27/2014  . CKD stage 3 due to type 2 diabetes mellitus ([place])   . Diabetes mellitus with renal manifestations, uncontrolled ([place])   . Benign essential HTN   . ED (erectile dysfunction) 06/09/2012  .  Hypertension 03/11/2011  . OSTEOMYELITIS, ACUTE, ANKLE/FOOT 07/24/2010  . EDEMA 02/15/2010  . MORBID OBESITY 07/05/2009  . TOBACCO ABUSE 07/05/2009  . OBSTRUCTIVE SLEEP APNEA 07/05/2009  . COPD (chronic obstructive pulmonary disease) ([place]) 07/05/2009  . 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 HYPERLIPIDEMIA 03/30/2008  . Generalized anxiety disorder 03/30/2008  . [place] DISEASE 03/30/2008    Past Surgical History:  Procedure Laterality Date  . ABDOMINAL SURGERY    . APPENDECTOMY    . AV FISTULA PLACEMENT Right 03/11/2018    Procedure: CREATION Brachiocephalic Fistula RIGHT ARM;  Surgeon: Rosetta Posner, MD;  Location: [facility];  Service: Vascular;  Laterality: Right;  . BACK SURGERY    . CARDIAC CATHETERIZATION     stent  . CHOLECYSTECTOMY    . CLOSED REDUCTION SHOULDER DISLOCATION    . COLONOSCOPY W/ BIOPSIES AND POLYPECTOMY    . diabetic ulcers    . GROIN EXPLORATION    . INSERTION OF ARTERIOVENOUS (AV) ARTEGRAFT ARM Left 03/18/2018   Procedure: INSERTION OF ARTERIOVENOUS (AV) FISTULA;  Surgeon: Rosetta Posner, MD;  Location: [facility];  Service: Vascular;  Laterality: Left;  . LIGATION ARTERIOVENOUS GORTEX GRAFT Right 03/18/2018   Procedure: LIGATION ARTERIOVENOUS FISTULA;  Surgeon: Rosetta Posner, MD;  Location: [facility];  Service: Vascular;  Laterality: Right;  . OSTECTOMY Left 04/23/2017   Procedure: OSTECTOMY LEFT GREAT TOE;  Surgeon: [name], DPM;  Location: [facility];  Service: Podiatry;  Laterality: Left;  left great toe  . TUMOR REMOVAL     small intestine         Home Medications    Prior to Admission medications   Medication Sig Start Date End Date Taking? Authorizing Provider  acetaminophen (TYLENOL) 325 MG tablet Take 975 mg by mouth every morning.     [provider]  albuterol (VENTOLIN HFA) 108 (90 BASE) MCG/ACT inhaler Inhale 2 puffs into the lungs every 6 (six) hours as needed for wheezing or shortness of breath.     [provider]  apixaban (ELIQUIS) 5 MG TABS tablet Take 1 tablet (5 mg total) by mouth 2 (two) times daily. 08/13/18   Arnoldo Lenis, MD  atorvastatin (LIPITOR) 40 MG tablet Take 1 tablet (40 mg total) by mouth every evening. 08/24/18   Arnoldo Lenis, MD  carvedilol (COREG) 25 MG tablet TAKE ONE TABLET BY MOUTH TWICE DAILY. 08/13/18   Arnoldo Lenis, MD  Cholecalciferol (VITAMIN D3) 2000 units TABS Take 2,000 Units by mouth daily.     [provider]  diltiazem (CARDIZEM) 30 MG tablet Take 1 tablet (30 mg total) by mouth 2 (two) times  daily. 06/08/18   Arnoldo Lenis, MD  epoetin alfa (EPOGEN) 4000 UNIT/ML injection Inject 4,000 Units into the vein every 14 (fourteen) days. 09/08/18   Fran Lowes, MD  hydrALAZINE (APRESOLINE) 100 MG tablet TAKE ONE TABLET BY MOUTH THREE TIMES DAILY 08/13/18   Arnoldo Lenis, MD  insulin glargine (LANTUS) 100 UNIT/ML injection Inject 35 Units daily after breakfast into the skin.     [provider]  iron polysaccharides (NIFEREX) 150 MG capsule Take 150 mg by mouth daily.    [provider]  LINZESS 290 MCG CAPS capsule Take 290 mcg by mouth daily as needed (for constipation).  07/26/15   [provider]  Melatonin 10 MG TABS Take 10-30 mg by mouth at bedtime.     [provider]  Multiple Vitamin (MULTIVITAMIN) tablet Take 1 tablet by mouth daily.  [provider]  omeprazole (PRILOSEC) 20 MG capsule Take 20 mg daily by mouth.    [provider]  oxyCODONE-acetaminophen (PERCOCET) 5-325 MG tablet Take 1 tablet by mouth every 6 (six) hours as needed for severe pain. 03/19/18   Rhyne, Hulen Shouts, PA-C  polyethylene glycol (MIRALAX / GLYCOLAX) packet Take 17 g by mouth every morning.    [provider]  potassium chloride SA (K-DUR,KLOR-CON) 20 MEQ tablet Take 1 tablet (20 mEq total) by mouth daily. 05/10/17   Orson Eva, MD  QVAR REDIHALER 80 MCG/ACT inhaler Inhale 2 puffs into the lungs 2 (two) times daily. 10/06/17   [provider]  sildenafil (VIAGRA) 100 MG tablet Take 1 tablet (100 mg total) by mouth as needed for erectile dysfunction. 01/23/18   Allred, Jeneen Rinks, MD  silver sulfADIAZINE (SILVADENE) 1 % cream Apply 1 application topically daily as needed (For wound care with dressing).     [provider]  torsemide (DEMADEX) 100 MG tablet Take 50 mg by mouth 2 (two) times daily. Changed by Dr. Lowanda Foster this March. 10/06/17   [provider]    Family History Family History  Problem Relation  Age of Onset  . Diabetes Mother   . Alcohol abuse Father     Social History Social History   Tobacco Use  . Smoking status: Current Every Day Smoker    Packs/day: 1.00    Years: 40.00    Pack years: 40.00    Types: Cigarettes    Start date: 05/20/1968  . Smokeless tobacco: Never Used  Substance Use Topics  . Alcohol use: No    Alcohol/week: 0.0 standard drinks  . Drug use: No     Allergies   Patient has no known allergies.   Review of Systems Review of Systems  Constitutional: Negative for chills and fever.  HENT: Negative for ear pain and sore throat.   Eyes: Negative for pain and visual disturbance.  Respiratory: Negative for cough and shortness of breath.   Cardiovascular: Negative for chest pain and palpitations.  Gastrointestinal: Positive for anal bleeding, blood in stool and hematochezia. Negative for abdominal distention, abdominal pain, constipation, diarrhea, nausea, rectal pain and vomiting.  Genitourinary: Negative for dysuria and hematuria.  Musculoskeletal: Negative for arthralgias and back pain.  Skin: Negative for color change and rash.  Neurological: Positive for dizziness and light-headedness. Negative for seizures, loss of consciousness and syncope.  All other systems reviewed and are negative.    Physical Exam Updated Vital Signs  ED Triage Vitals  Enc Vitals Group     BP 10/14/18 1445 (!) 168/104     Pulse Rate 10/14/18 1445 60     Resp 10/14/18 1445 15     Temp 10/14/18 1445 97.7 F (36.5 C)     Temp Source 10/14/18 1445 Oral     SpO2 10/14/18 1445 100 %     Weight 10/14/18 1503 235 lb (106.6 kg)     Height 10/14/18 1503 6\' 4"  (1.93 m)     Head Circumference --      Peak Flow --      Pain Score 10/14/18 1502 2     Pain Loc --      Pain Edu? --      Excl. in [place]? --     Physical Exam Vitals signs and nursing note reviewed.  Constitutional:      General: He is not in acute distress.    Appearance: He is well-developed.  HENT:  Head: Normocephalic and atraumatic.     Mouth/Throat:     Mouth: Mucous membranes are moist.  Eyes:     Extraocular Movements: Extraocular movements intact.     Pupils: Pupils are equal, round, and reactive to light.     Comments: Pale conjunctiva   Neck:     Musculoskeletal: Normal range of motion and neck supple.  Cardiovascular:     Rate and Rhythm: Normal rate and regular rhythm.     Pulses: Normal pulses.     Heart sounds: No murmur.  Pulmonary:     Effort: Pulmonary effort is normal. No respiratory distress.     Breath sounds: Normal breath sounds.  Abdominal:     General: There is no distension.     Palpations: Abdomen is soft.     Tenderness: There is no abdominal tenderness.  Genitourinary:    Rectum: Guaiac result negative (no gross melena/hematochezia).  Skin:    General: Skin is warm and dry.     Capillary Refill: Capillary refill takes less than 2 seconds.  Neurological:     General: No focal deficit present.     Mental Status: He is alert.      ED Treatments / Results  Labs (all labs ordered are listed, but only abnormal results are displayed) Labs Reviewed  COMPREHENSIVE METABOLIC PANEL - Abnormal; Notable for the following components:      Result Value   BUN 60 (*)    Creatinine, Ser 4.53 (*)    Albumin 3.4 (*)    GFR calc non Af Amer 13 (*)    GFR calc Af Amer 15 (*)    All other components within normal limits  CBC WITH DIFFERENTIAL/PLATELET - Abnormal; Notable for the following components:   RBC 2.96 (*)    Hemoglobin 8.8 (*)    HCT 28.7 (*)    RDW 20.7 (*)    All other components within normal limits  POC OCCULT BLOOD, ED - Abnormal; Notable for the following components:   Fecal Occult Bld POSITIVE (*)    All other components within normal limits  TYPE AND SCREEN  ABO/RH    EKG None  Radiology No results found.  Procedures Procedures (including critical care time)  Medications Ordered in ED Medications - No data to display    Initial Impression / Assessment and Plan / ED Course  I have reviewed the triage vital signs and the nursing notes.  Pertinent labs & imaging results that were available during my care of the patient were reviewed by me and considered in my medical decision making (see chart for details).     [name] is a 68 year old male with history of CAD, COPD, hypertension, atrial flutter on blood thinner who presents to the ED with rectal bleeding.  Patient with unremarkable vitals.  Patient has had dark and bloody stools for the last week since a colonoscopy last week in which she had something clipped.  Patient continues to take Eliquis.  Has felt lightheaded.  Has a history of CKD and gets EPO shots for low hemoglobin.  States that he called his GI doctor who recommended evaluation in the emergency department.  Patient does not have any abdominal tenderness on exam.  Is overall well-appearing but does appear pale.  Rectal exam showed no obvious melena or hematochezia. Stool is grossly brown. Will get basic labs including type and screen.  Likely will touch base with GI after labs are back.  May need transfusion.  Hemoglobin  stable at 8.8.  Otherwise lab work unremarkable.  Creatinine at baseline.  BUN at baseline.  Patient with grossly brown stool on exam but did have positive Hemoccult.  Overall patient hemodynamically stable.  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  Patient had that procedure a week ago.  Has had some intermittent red stool over this time since colonoscopy.  Eliquis was started several days ago as well.  However, hemoglobin stable.  At this time unlikely bleeding is from AVM.  Possibly from hemorrhoids or polyps.  After talking with GI physician he recommends close follow-up with PCP for repeat hemoglobin which patient states that he can do 2 days from now.  He is getting EPO injections as well but has  not had one over the past week.  Patient is okay with this plan and he understands to return to the ED if he has more profuse and consistent bleeding and increased symptoms.  However at this time likely not emergent bleeding given stable hemoglobin and symptoms.  Discharged from ED in good condition. Possible bleeding for polyp or hemorrhoid. Less likely AVM at this time. Pt understand return precautions.   This chart was dictated using voice recognition software.  Despite best efforts to proofread,  errors can occur which can change the documentation meaning.    Final Clinical Impressions(s) / ED Diagnoses   Final diagnoses:  Rectal bleeding    ED Discharge Orders    None       Lennice Sites, DO 10/14/18 1723    Lennice Sites, DO 10/14/18 [place], [place], DO 10/14/18 1725

## 2018-10-14 NOTE — ED Notes (Signed)
Pt ambulated independently to restroom, NAD noted

## 2018-10-16 DIAGNOSIS — K625 Hemorrhage of anus and rectum: Secondary | ICD-10-CM | POA: Diagnosis not present

## 2018-10-16 DIAGNOSIS — E1122 Type 2 diabetes mellitus with diabetic chronic kidney disease: Secondary | ICD-10-CM | POA: Diagnosis not present

## 2018-10-16 DIAGNOSIS — D3A00 Benign carcinoid tumor of unspecified site: Secondary | ICD-10-CM | POA: Diagnosis not present

## 2018-10-16 DIAGNOSIS — D500 Iron deficiency anemia secondary to blood loss (chronic): Secondary | ICD-10-CM | POA: Diagnosis not present

## 2018-10-16 DIAGNOSIS — I480 Paroxysmal atrial fibrillation: Secondary | ICD-10-CM | POA: Diagnosis not present

## 2018-10-16 DIAGNOSIS — K5909 Other constipation: Secondary | ICD-10-CM | POA: Diagnosis not present

## 2018-10-16 DIAGNOSIS — Z86010 Personal history of colonic polyps: Secondary | ICD-10-CM | POA: Diagnosis not present

## 2018-10-16 DIAGNOSIS — Z950 Presence of cardiac pacemaker: Secondary | ICD-10-CM | POA: Diagnosis not present

## 2018-10-16 DIAGNOSIS — Z7901 Long term (current) use of anticoagulants: Secondary | ICD-10-CM | POA: Diagnosis not present

## 2018-10-16 DIAGNOSIS — N184 Chronic kidney disease, stage 4 (severe): Secondary | ICD-10-CM | POA: Diagnosis not present

## 2018-10-20 ENCOUNTER — Encounter (HOSPITAL_COMMUNITY)
Admission: RE | Admit: 2018-10-20 | Discharge: 2018-10-20 | Disposition: A | Discharge status: Home / Self Care | Payer: Medicare Other | Source: Ambulatory Visit | Attending: Nephrology | Admitting: Nephrology

## 2018-10-20 ENCOUNTER — Encounter (HOSPITAL_COMMUNITY): Payer: Self-pay

## 2018-10-20 ENCOUNTER — Other Ambulatory Visit: Payer: Self-pay

## 2018-10-20 DIAGNOSIS — N184 Chronic kidney disease, stage 4 (severe): Secondary | ICD-10-CM | POA: Insufficient documentation

## 2018-10-20 DIAGNOSIS — D631 Anemia in chronic kidney disease: Secondary | ICD-10-CM | POA: Diagnosis not present

## 2018-10-20 LAB — RENAL FUNCTION PANEL
Albumin: 3.7 g/dL (ref 3.5–5.0)
Anion gap: 14 (ref 5–15)
BUN: 72 mg/dL — ABNORMAL HIGH (ref 8–23)
CO2: 23 mmol/L (ref 22–32)
Calcium: 9.1 mg/dL (ref 8.9–10.3)
Chloride: 102 mmol/L (ref 98–111)
Creatinine, Ser: 4.49 mg/dL — ABNORMAL HIGH (ref 0.61–1.24)
GFR calc Af Amer: 15 mL/min — ABNORMAL LOW (ref >60)
GFR calc non Af Amer: 13 mL/min — ABNORMAL LOW (ref >60)
Glucose, Bld: 107 mg/dL — ABNORMAL HIGH (ref 70–99)
Phosphorus: 4.2 mg/dL (ref 2.5–4.6)
Potassium: 4.2 mmol/L (ref 3.5–5.1)
Sodium: 139 mmol/L (ref 135–145)

## 2018-10-20 LAB — FERRITIN: Ferritin: 28 ng/mL (ref 24–336)

## 2018-10-20 LAB — IRON AND TIBC
Iron: 26 ug/dL — ABNORMAL LOW (ref 45–182)
Saturation Ratios: 7 % — ABNORMAL LOW (ref 17.9–39.5)
TIBC: 366 ug/dL (ref 250–450)
UIBC: 340 ug/dL

## 2018-10-20 LAB — PROTEIN / CREATININE RATIO, URINE
Creatinine, Urine: 74.66 mg/dL
Protein Creatinine Ratio: 4.13 mg/mg{Cre} — ABNORMAL HIGH (ref 0.00–0.15)
Total Protein, Urine: 308 mg/dL

## 2018-10-20 LAB — POCT HEMOGLOBIN-HEMACUE: Hemoglobin: 10.3 g/dL — ABNORMAL LOW (ref 13.0–17.0)

## 2018-10-20 LAB — HEMOGLOBIN AND HEMATOCRIT, BLOOD
HCT: 32.5 % — ABNORMAL LOW (ref 39.0–52.0)
Hemoglobin: 10 g/dL — ABNORMAL LOW (ref 13.0–17.0)

## 2018-10-20 MED ORDER — EPOETIN ALFA 4000 UNIT/ML IJ SOLN: 4000.0000 [IU] | Freq: Once | INTRAMUSCULAR | Status: DC

## 2018-10-21 LAB — VITAMIN D 25 HYDROXY (VIT D DEFICIENCY, FRACTURES): Vit D, 25-Hydroxy: 47.5 ng/mL (ref 30.0–100.0)

## 2018-10-21 LAB — PTH, INTACT AND CALCIUM
Calcium, Total (PTH): 9.1 mg/dL (ref 8.6–10.2)
PTH: 69 pg/mL — ABNORMAL HIGH (ref 15–65)

## 2018-10-22 ENCOUNTER — Telehealth: Payer: Self-pay | Admitting: Cardiology

## 2018-10-22 NOTE — Telephone Encounter (Signed)
Received telephone call from Dr. Laurence Spates (gastroenterologist) requesting to speak with Dr. [name] in regards to patient being on Eliquis. States that patient is having issues with GI Bleed.  Please call Dr. Oletta Lamas [phone].

## 2018-10-22 NOTE — Telephone Encounter (Signed)
I will forward to Dr Branch 

## 2018-10-23 ENCOUNTER — Ambulatory Visit: Payer: Medicare Other | Admitting: Pulmonary Disease

## 2018-10-23 ENCOUNTER — Telehealth: Payer: Self-pay | Admitting: Cardiology

## 2018-10-23 ENCOUNTER — Encounter: Payer: PRIVATE HEALTH INSURANCE | Admitting: Internal Medicine

## 2018-10-23 ENCOUNTER — Other Ambulatory Visit: Payer: Self-pay | Admitting: Gastroenterology

## 2018-10-23 ENCOUNTER — Telehealth: Payer: Self-pay

## 2018-10-23 NOTE — Progress Notes (Signed)
Spoke with GI Dr Oletta Lamas, patient with ongoing issues with GI bleeding. From our standpoint ok to hold eliquis in this situation in plans for repeat endoscopy and ongoing bleeding. Would hold 2 days prior to endoscopy, since ongoing bleeding would be reasonable to hold and restart 5 days later (off total of 1 week). If recurrent ongoing bleeding we may have to come off eliquis for longer period   Carlyle Dolly MD

## 2018-10-26 DIAGNOSIS — I4891 Unspecified atrial fibrillation: Secondary | ICD-10-CM | POA: Diagnosis not present

## 2018-10-26 DIAGNOSIS — N185 Chronic kidney disease, stage 5: Secondary | ICD-10-CM | POA: Diagnosis not present

## 2018-10-26 DIAGNOSIS — D638 Anemia in other chronic diseases classified elsewhere: Secondary | ICD-10-CM | POA: Diagnosis not present

## 2018-10-26 DIAGNOSIS — R809 Proteinuria, unspecified: Secondary | ICD-10-CM | POA: Diagnosis not present

## 2018-10-26 DIAGNOSIS — D508 Other iron deficiency anemias: Secondary | ICD-10-CM | POA: Diagnosis not present

## 2018-10-27 ENCOUNTER — Other Ambulatory Visit: Payer: Self-pay | Admitting: Gastroenterology

## 2018-10-29 ENCOUNTER — Telehealth: Payer: Self-pay | Admitting: *Deleted

## 2018-10-29 NOTE — Telephone Encounter (Signed)
Medications and allergies reviewed with patient  10/26/2018  BP 145/70, HR 70 and weight 232 lbs  Will reset password to confirm consent

## 2018-10-30 ENCOUNTER — Telehealth (INDEPENDENT_AMBULATORY_CARE_PROVIDER_SITE_OTHER): Payer: Medicare Other | Admitting: Internal Medicine

## 2018-10-30 VITALS — BP 145/70 | HR 70 | Wt 245.0 lb

## 2018-10-30 DIAGNOSIS — R001 Bradycardia, unspecified: Secondary | ICD-10-CM | POA: Diagnosis not present

## 2018-10-30 DIAGNOSIS — I480 Paroxysmal atrial fibrillation: Secondary | ICD-10-CM | POA: Diagnosis not present

## 2018-10-30 DIAGNOSIS — N184 Chronic kidney disease, stage 4 (severe): Secondary | ICD-10-CM | POA: Diagnosis not present

## 2018-10-30 DIAGNOSIS — I2510 Atherosclerotic heart disease of native coronary artery without angina pectoris: Secondary | ICD-10-CM | POA: Diagnosis not present

## 2018-10-30 NOTE — Progress Notes (Signed)
Electrophysiology TeleHealth Note    Due to national recommendations of social distancing due to [place] 19, an audio telehealth visit is felt to be most appropriate for this patient at this time.  Verbal consent was obtained by me for the telehealth visit today.  The patient does not have capability for a virtual visit.  A phone visit is therefore required today.   Date:  10/30/2018   ID:  [name], DOB [birth year], MRN [id number]  Location: patient's home  Provider location:  [facility]  Evaluation Performed: Follow-up visit  PCP:  Manon Hilding, MD   Electrophysiologist:  Dr Rayann Heman  Chief Complaint:  palpitations  History of Present Illness:    [name] is a 68 y.o. male who presents via telehealth conferencing today.  Since last being seen in our clinic, the patient reports doing very well. He remains concerned with renal function.  Today, he denies symptoms of palpitations, chest pain, shortness of breath,  lower extremity edema, dizziness, presyncope, or syncope.  The patient is otherwise without complaint today.  The patient denies symptoms of fevers, chills, cough, or new SOB worrisome for COVID 19.  Past Medical History:  Diagnosis Date   Asthma    Atrial flutter ([place])    CAD (coronary artery disease)    a. s/p prior stenting of RCA in 1999 b. cath in 2003 showing moderate CAD c. NST in 2016 showing small area of ischemic and low-risk   Chronic renal insufficiency    COPD (chronic obstructive pulmonary disease) ([place])    with ongoing tobacco use and patient failed sham takes   DM2 (diabetes mellitus, type 2) ([place])    Dyslipidemia    Edema    chronic lower extremity secondary to right heart failure and chronic venous insufficiency   Headache    HTN (hypertension)    Morbid obesity ([facility])    Pneumonia    Presence of permanent cardiac pacemaker    PVD (peripheral vascular disease) ([place])    with toe amputations secondary to Buerger's  disease.    Reflux esophagitis    hx   Renal insufficiency    Sleep apnea    Two-vessel coronary artery disease    moderate. by cath in 2003. Status post stenting of the mdi RCA November 1999 normal left ventricular ejection fraction   Wears glasses    Wears hearing aid    B/L    Past Surgical History:  Procedure Laterality Date   ABDOMINAL SURGERY     APPENDECTOMY     AV FISTULA PLACEMENT Right 03/11/2018   Procedure: CREATION Brachiocephalic Fistula RIGHT ARM;  Surgeon: Rosetta Posner, MD;  Location: [facility];  Service: Vascular;  Laterality: Right;   BACK SURGERY     CARDIAC CATHETERIZATION     stent   CHOLECYSTECTOMY     CLOSED REDUCTION SHOULDER DISLOCATION     COLONOSCOPY W/ BIOPSIES AND POLYPECTOMY     diabetic ulcers     GROIN EXPLORATION     INSERTION OF ARTERIOVENOUS (AV) ARTEGRAFT ARM Left 03/18/2018   Procedure: INSERTION OF ARTERIOVENOUS (AV) FISTULA;  Surgeon: Rosetta Posner, MD;  Location: [facility];  Service: Vascular;  Laterality: Left;   LIGATION ARTERIOVENOUS GORTEX GRAFT Right 03/18/2018   Procedure: LIGATION ARTERIOVENOUS FISTULA;  Surgeon: Rosetta Posner, MD;  [facility];  Service: Vascular;  Laterality: Right;   OSTECTOMY Left 04/23/2017   Procedure: OSTECTOMY LEFT GREAT TOE;  Surgeon: [name], DPM;  [facility];  Service: Podiatry;  Laterality: Left;  left great toe   TUMOR REMOVAL     small intestine     Current Outpatient Medications  Medication Sig Dispense Refill   acetaminophen (TYLENOL) 325 MG tablet Take 975 mg by mouth every morning.      albuterol (VENTOLIN HFA) 108 (90 BASE) MCG/ACT inhaler Inhale 2 puffs into the lungs every 6 (six) hours as needed for wheezing or shortness of breath.      apixaban (ELIQUIS) 5 MG TABS tablet Take 1 tablet (5 mg total) by mouth 2 (two) times daily. 180 tablet 3   atorvastatin (LIPITOR) 40 MG tablet Take 1 tablet (40 mg total) by mouth every evening. 90 tablet 2   carvedilol  (COREG) 25 MG tablet TAKE ONE TABLET BY MOUTH TWICE DAILY. 180 tablet 2   Cholecalciferol (VITAMIN D3) 2000 units TABS Take 2,000 Units by mouth daily.      diltiazem (CARDIZEM) 30 MG tablet Take 1 tablet (30 mg total) by mouth 2 (two) times daily. 60 tablet 6   epoetin alfa (EPOGEN) 4000 UNIT/ML injection Inject 4,000 Units into the vein every 14 (fourteen) days.     hydrALAZINE (APRESOLINE) 100 MG tablet TAKE ONE TABLET BY MOUTH THREE TIMES DAILY 270 tablet 2   insulin glargine (LANTUS) 100 UNIT/ML injection Inject 35 Units daily after breakfast into the skin.      iron polysaccharides (NIFEREX) 150 MG capsule Take 150 mg by mouth daily.     LINZESS 290 MCG CAPS capsule Take 290 mcg by mouth daily as needed (for constipation).   6   Melatonin 10 MG TABS Take 10-30 mg by mouth at bedtime.      Multiple Vitamin (MULTIVITAMIN) tablet Take 1 tablet by mouth daily.       omeprazole (PRILOSEC) 20 MG capsule Take 20 mg daily by mouth.     oxyCODONE-acetaminophen (PERCOCET) 5-325 MG tablet Take 1 tablet by mouth every 6 (six) hours as needed for severe pain. 8 tablet 0   polyethylene glycol (MIRALAX / GLYCOLAX) packet Take 17 g by mouth every morning.     potassium chloride SA (K-DUR,KLOR-CON) 20 MEQ tablet Take 1 tablet (20 mEq total) by mouth daily. 30 tablet 0   QVAR REDIHALER 80 MCG/ACT inhaler Inhale 2 puffs into the lungs 2 (two) times daily.  12   sildenafil (VIAGRA) 100 MG tablet Take 1 tablet (100 mg total) by mouth as needed for erectile dysfunction. 10 tablet 0   silver sulfADIAZINE (SILVADENE) 1 % cream Apply 1 application topically daily as needed (For wound care with dressing).      torsemide (DEMADEX) 100 MG tablet Take 50 mg by mouth 2 (two) times daily. Changed by Dr. Lowanda Foster this March.  4   No current facility-administered medications for this visit.     Allergies:   Patient has no known allergies.   Social History:  The patient  reports that he has been smoking  cigarettes. He started smoking about 50 years ago. He has a 40.00 pack-year smoking history. He has never used smokeless tobacco. He reports that he does not drink alcohol or use drugs.   Family History:  The patient's  family history includes Alcohol abuse in his father; Diabetes in his mother.   ROS:  Please see the history of present illness.   All other systems are personally reviewed and negative.    Exam:    Vital Signs:  BP (!) 145/70  Pulse 70    Wt 245 lb (111.1 kg)    BMI 29.82 kg/m   Well sounding today   Labs/Other Tests and Data Reviewed:    Recent Labs: 10/14/2018: ALT 15; Platelets 208 10/20/2018: BUN 72; Creatinine, Ser 4.49; Hemoglobin 10.3; Potassium 4.2; Sodium 139   Wt Readings from Last 3 Encounters:  10/30/18 245 lb (111.1 kg)  10/14/18 235 lb (106.6 kg)  07/23/18 251 lb 6.4 oz (114 kg)     Last device remote is reviewed from [place] PDF which reveals normal device function, stable AF burden   ASSESSMENT & PLAN:    1.  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 He V paces <1%  2.  Paroxysmal atrial fibrillation/ atrial flutter AF burden remains low by last remote CHADS2VASC is 3.  With anemia and CKD, will stop Eliquis for now.   3.  CKD with anemia Stage IV  4.  CAD No ischemic symptoms Stable No change required today    Follow-up:  [facility], Dr [name] as scheduled, me in 1 year    Patient Risk:  after full review of this patients clinical status, I feel that they are at moderate risk at this time.  Today, I have spent 15 minutes with the patient with telehealth technology discussing arrhythmia management .    Army Fossa, MD  10/30/2018 10:29 AM     [address] [place] [address] [phone] (office) [phone] (fax)

## 2018-11-03 ENCOUNTER — Encounter (HOSPITAL_COMMUNITY)
Admission: RE | Admit: 2018-11-03 | Discharge: 2018-11-03 | Disposition: A | Discharge status: Home / Self Care | Payer: Medicare Other | Source: Ambulatory Visit | Attending: Nephrology | Admitting: Nephrology

## 2018-11-03 ENCOUNTER — Other Ambulatory Visit: Payer: Self-pay

## 2018-11-03 ENCOUNTER — Encounter (HOSPITAL_COMMUNITY): Payer: Self-pay

## 2018-11-03 DIAGNOSIS — N184 Chronic kidney disease, stage 4 (severe): Secondary | ICD-10-CM | POA: Diagnosis not present

## 2018-11-03 DIAGNOSIS — D631 Anemia in chronic kidney disease: Secondary | ICD-10-CM | POA: Diagnosis not present

## 2018-11-03 HISTORY — DX: Anemia, unspecified: D64.9

## 2018-11-03 LAB — POCT HEMOGLOBIN-HEMACUE: Hemoglobin: 11.4 g/dL — ABNORMAL LOW (ref 13.0–17.0)

## 2018-11-03 MED ORDER — SODIUM CHLORIDE 0.9 % IV SOLN
510.0000 mg | Freq: Once | INTRAVENOUS | Status: AC
  Administered 2018-11-03: 510 mg via INTRAVENOUS
  Filled 2018-11-03: qty 17

## 2018-11-03 MED ORDER — SODIUM CHLORIDE 0.9 % IV SOLN
Freq: Once | INTRAVENOUS | Status: AC
  Administered 2018-11-03: 12:00:00 via INTRAVENOUS

## 2018-11-03 MED ORDER — EPOETIN ALFA 4000 UNIT/ML IJ SOLN: 4000.0000 [IU] | Freq: Once | INTRAMUSCULAR | Status: DC

## 2018-11-04 ENCOUNTER — Ambulatory Visit (INDEPENDENT_AMBULATORY_CARE_PROVIDER_SITE_OTHER): Payer: Medicare Other | Admitting: *Deleted

## 2018-11-04 DIAGNOSIS — I495 Sick sinus syndrome: Secondary | ICD-10-CM | POA: Diagnosis not present

## 2018-11-04 LAB — CUP PACEART REMOTE DEVICE CHECK
Battery Remaining Longevity: 168 mo
Battery Remaining Percentage: 100 %
Brady Statistic RA Percent Paced: 38 %
Brady Statistic RV Percent Paced: 9 %
Date Time Interrogation Session: 20200617084100
Implantable Lead Implant Date: 20190514
Implantable Lead Implant Date: 20190514
Implantable Lead Location: 753859
Implantable Lead Location: 753860
Implantable Lead Model: 7741
Implantable Lead Model: 7742
Implantable Lead Serial Number: 1015225
Implantable Lead Serial Number: 1020907
Implantable Pulse Generator Implant Date: 20190514
Lead Channel Impedance Value: 724 Ohm
Lead Channel Impedance Value: 872 Ohm
Lead Channel Pacing Threshold Amplitude: 0.7 V
Lead Channel Pacing Threshold Pulse Width: 0.4 ms
Lead Channel Setting Pacing Amplitude: 2 V
Lead Channel Setting Pacing Amplitude: 3.5 V
Lead Channel Setting Pacing Pulse Width: 1 ms
Lead Channel Setting Sensing Sensitivity: 1.5 mV
Pulse Gen Serial Number: 832937

## 2018-11-05 ENCOUNTER — Ambulatory Visit
Admission: RE | Admit: 2018-11-05 | Discharge: 2018-11-05 | Disposition: A | Discharge status: Home / Self Care | Payer: Medicare Other | Source: Ambulatory Visit | Attending: Sports Medicine | Admitting: Sports Medicine

## 2018-11-05 ENCOUNTER — Other Ambulatory Visit: Payer: Self-pay | Admitting: Sports Medicine

## 2018-11-05 DIAGNOSIS — M542 Cervicalgia: Secondary | ICD-10-CM

## 2018-11-05 DIAGNOSIS — M2578 Osteophyte, vertebrae: Secondary | ICD-10-CM | POA: Diagnosis not present

## 2018-11-05 DIAGNOSIS — M5412 Radiculopathy, cervical region: Secondary | ICD-10-CM | POA: Diagnosis not present

## 2018-11-05 DIAGNOSIS — M50322 Other cervical disc degeneration at C5-C6 level: Secondary | ICD-10-CM | POA: Diagnosis not present

## 2018-11-09 ENCOUNTER — Other Ambulatory Visit (HOSPITAL_COMMUNITY)
Admission: RE | Admit: 2018-11-09 | Discharge: 2018-11-09 | Disposition: A | Discharge status: Home / Self Care | Payer: Medicare Other | Source: Ambulatory Visit | Attending: Gastroenterology | Admitting: Gastroenterology

## 2018-11-09 DIAGNOSIS — Z1159 Encounter for screening for other viral diseases: Secondary | ICD-10-CM | POA: Insufficient documentation

## 2018-11-09 LAB — SARS CORONAVIRUS 2 (TAT 6-24 HRS): SARS Coronavirus 2: NEGATIVE

## 2018-11-10 ENCOUNTER — Other Ambulatory Visit: Payer: Self-pay

## 2018-11-10 ENCOUNTER — Encounter (HOSPITAL_COMMUNITY): Payer: Self-pay

## 2018-11-10 ENCOUNTER — Encounter (HOSPITAL_COMMUNITY)
Admission: RE | Admit: 2018-11-10 | Discharge: 2018-11-10 | Disposition: A | Discharge status: Home / Self Care | Payer: Medicare Other | Source: Ambulatory Visit | Attending: Nephrology | Admitting: Nephrology

## 2018-11-10 DIAGNOSIS — D631 Anemia in chronic kidney disease: Secondary | ICD-10-CM | POA: Diagnosis not present

## 2018-11-10 DIAGNOSIS — N184 Chronic kidney disease, stage 4 (severe): Secondary | ICD-10-CM | POA: Diagnosis not present

## 2018-11-10 MED ORDER — SODIUM CHLORIDE 0.9 % IV SOLN
510.0000 mg | Freq: Once | INTRAVENOUS | Status: AC
  Administered 2018-11-10: 510 mg via INTRAVENOUS
  Filled 2018-11-10: qty 17

## 2018-11-10 MED ORDER — SODIUM CHLORIDE 0.9 % IV SOLN
Freq: Once | INTRAVENOUS | Status: AC
  Administered 2018-11-10: 12:00:00 via INTRAVENOUS

## 2018-11-11 ENCOUNTER — Other Ambulatory Visit: Payer: Self-pay | Admitting: Cardiology

## 2018-11-11 ENCOUNTER — Encounter (HOSPITAL_COMMUNITY): Payer: Self-pay

## 2018-11-11 NOTE — Progress Notes (Signed)
SPOKE W/  [place]     SCREENING SYMPTOMS OF COVID 19:   COUGH--NO  RUNNY NOSE--- NO  SORE THROAT---NO  NASAL CONGESTION----NO  SNEEZING----NO  SHORTNESS OF BREATH---NO  DIFFICULTY BREATHING---NO  TEMP >100.0 -----NO  UNEXPLAINED BODY ACHES------NO  CHILLS -------- NO  HEADACHES ---------NO  LOSS OF SMELL/ TASTE --------NO   HAVE YOU OR ANY FAMILY MEMBER TRAVELLED PAST 14 DAYS OUT OF THE   COUNTY---LIVES IN [place]----NO COUNTRY----NO  HAVE YOU OR ANY FAMILY MEMBER BEEN EXPOSED TO ANYONE WITH COVID 19? NO

## 2018-11-11 NOTE — Progress Notes (Signed)
The following are in epic: Pacer device check 11/04/2018 EKG 02/2018 ECHO 09/2017 Cardiac cath 09/2017 CXR 10/2017 Last office visit note Dr. Rayann Heman 10/30/2018  Sent fax twice requesting Pacemaker orders for procedure.

## 2018-11-12 ENCOUNTER — Encounter (HOSPITAL_COMMUNITY): Payer: Self-pay | Admitting: *Deleted

## 2018-11-12 ENCOUNTER — Ambulatory Visit (HOSPITAL_COMMUNITY): Payer: Medicare Other | Admitting: Certified Registered Nurse Anesthetist

## 2018-11-12 ENCOUNTER — Encounter (HOSPITAL_COMMUNITY)
Admission: RE | Disposition: A | Discharge status: Home / Self Care | Payer: Self-pay | Source: Home / Self Care | Attending: Gastroenterology

## 2018-11-12 ENCOUNTER — Ambulatory Visit (HOSPITAL_COMMUNITY)
Admission: RE | Admit: 2018-11-12 | Discharge: 2018-11-12 | Disposition: A | Discharge status: Home / Self Care | Payer: Medicare Other | Attending: Gastroenterology | Admitting: Gastroenterology

## 2018-11-12 DIAGNOSIS — I4892 Unspecified atrial flutter: Secondary | ICD-10-CM | POA: Diagnosis not present

## 2018-11-12 DIAGNOSIS — Z833 Family history of diabetes mellitus: Secondary | ICD-10-CM | POA: Insufficient documentation

## 2018-11-12 DIAGNOSIS — D124 Benign neoplasm of descending colon: Secondary | ICD-10-CM | POA: Insufficient documentation

## 2018-11-12 DIAGNOSIS — Z992 Dependence on renal dialysis: Secondary | ICD-10-CM | POA: Insufficient documentation

## 2018-11-12 DIAGNOSIS — Z7901 Long term (current) use of anticoagulants: Secondary | ICD-10-CM | POA: Diagnosis not present

## 2018-11-12 DIAGNOSIS — Z950 Presence of cardiac pacemaker: Secondary | ICD-10-CM | POA: Insufficient documentation

## 2018-11-12 DIAGNOSIS — J449 Chronic obstructive pulmonary disease, unspecified: Secondary | ICD-10-CM | POA: Insufficient documentation

## 2018-11-12 DIAGNOSIS — I480 Paroxysmal atrial fibrillation: Secondary | ICD-10-CM | POA: Diagnosis not present

## 2018-11-12 DIAGNOSIS — Z79899 Other long term (current) drug therapy: Secondary | ICD-10-CM | POA: Insufficient documentation

## 2018-11-12 DIAGNOSIS — D175 Benign lipomatous neoplasm of intra-abdominal organs: Secondary | ICD-10-CM | POA: Diagnosis not present

## 2018-11-12 DIAGNOSIS — Z7951 Long term (current) use of inhaled steroids: Secondary | ICD-10-CM | POA: Insufficient documentation

## 2018-11-12 DIAGNOSIS — K5520 Angiodysplasia of colon without hemorrhage: Secondary | ICD-10-CM | POA: Diagnosis not present

## 2018-11-12 DIAGNOSIS — Z86010 Personal history of colonic polyps: Secondary | ICD-10-CM | POA: Insufficient documentation

## 2018-11-12 DIAGNOSIS — I130 Hypertensive heart and chronic kidney disease with heart failure and stage 1 through stage 4 chronic kidney disease, or unspecified chronic kidney disease: Secondary | ICD-10-CM | POA: Diagnosis not present

## 2018-11-12 DIAGNOSIS — Z6828 Body mass index (BMI) 28.0-28.9, adult: Secondary | ICD-10-CM | POA: Insufficient documentation

## 2018-11-12 DIAGNOSIS — D500 Iron deficiency anemia secondary to blood loss (chronic): Secondary | ICD-10-CM | POA: Insufficient documentation

## 2018-11-12 DIAGNOSIS — K317 Polyp of stomach and duodenum: Secondary | ICD-10-CM | POA: Insufficient documentation

## 2018-11-12 DIAGNOSIS — I714 Abdominal aortic aneurysm, without rupture: Secondary | ICD-10-CM | POA: Insufficient documentation

## 2018-11-12 DIAGNOSIS — E1151 Type 2 diabetes mellitus with diabetic peripheral angiopathy without gangrene: Secondary | ICD-10-CM | POA: Diagnosis not present

## 2018-11-12 DIAGNOSIS — K635 Polyp of colon: Secondary | ICD-10-CM | POA: Diagnosis not present

## 2018-11-12 DIAGNOSIS — I731 Thromboangiitis obliterans [Buerger's disease]: Secondary | ICD-10-CM | POA: Insufficient documentation

## 2018-11-12 DIAGNOSIS — Z8673 Personal history of transient ischemic attack (TIA), and cerebral infarction without residual deficits: Secondary | ICD-10-CM | POA: Insufficient documentation

## 2018-11-12 DIAGNOSIS — Z811 Family history of alcohol abuse and dependence: Secondary | ICD-10-CM | POA: Insufficient documentation

## 2018-11-12 DIAGNOSIS — R0789 Other chest pain: Secondary | ICD-10-CM | POA: Insufficient documentation

## 2018-11-12 DIAGNOSIS — E1122 Type 2 diabetes mellitus with diabetic chronic kidney disease: Secondary | ICD-10-CM | POA: Insufficient documentation

## 2018-11-12 DIAGNOSIS — G4733 Obstructive sleep apnea (adult) (pediatric): Secondary | ICD-10-CM | POA: Insufficient documentation

## 2018-11-12 DIAGNOSIS — Z794 Long term (current) use of insulin: Secondary | ICD-10-CM | POA: Insufficient documentation

## 2018-11-12 DIAGNOSIS — F17210 Nicotine dependence, cigarettes, uncomplicated: Secondary | ICD-10-CM | POA: Insufficient documentation

## 2018-11-12 DIAGNOSIS — E785 Hyperlipidemia, unspecified: Secondary | ICD-10-CM | POA: Diagnosis not present

## 2018-11-12 DIAGNOSIS — I2510 Atherosclerotic heart disease of native coronary artery without angina pectoris: Secondary | ICD-10-CM | POA: Insufficient documentation

## 2018-11-12 DIAGNOSIS — K922 Gastrointestinal hemorrhage, unspecified: Secondary | ICD-10-CM | POA: Diagnosis not present

## 2018-11-12 DIAGNOSIS — N183 Chronic kidney disease, stage 3 (moderate): Secondary | ICD-10-CM | POA: Diagnosis not present

## 2018-11-12 DIAGNOSIS — I5032 Chronic diastolic (congestive) heart failure: Secondary | ICD-10-CM | POA: Insufficient documentation

## 2018-11-12 DIAGNOSIS — D125 Benign neoplasm of sigmoid colon: Secondary | ICD-10-CM | POA: Insufficient documentation

## 2018-11-12 DIAGNOSIS — Z9049 Acquired absence of other specified parts of digestive tract: Secondary | ICD-10-CM | POA: Insufficient documentation

## 2018-11-12 HISTORY — DX: Heart failure, unspecified: I50.9

## 2018-11-12 HISTORY — DX: Other fatigue: R53.83

## 2018-11-12 HISTORY — PX: POLYPECTOMY: SHX5525

## 2018-11-12 HISTORY — PX: BIOPSY: SHX5522

## 2018-11-12 HISTORY — DX: Atherosclerosis of aorta: I70.0

## 2018-11-12 HISTORY — PX: COLONOSCOPY WITH PROPOFOL: SHX5780

## 2018-11-12 HISTORY — PX: ESOPHAGOGASTRODUODENOSCOPY (EGD) WITH PROPOFOL: SHX5813

## 2018-11-12 HISTORY — PX: HEMOSTASIS CLIP PLACEMENT: SHX6857

## 2018-11-12 HISTORY — DX: Arteriovenous fistula, acquired: I77.0

## 2018-11-12 HISTORY — DX: Chronic kidney disease, stage 5: N18.5

## 2018-11-12 LAB — GLUCOSE, CAPILLARY
Glucose-Capillary: 59 mg/dL — ABNORMAL LOW (ref 70–99)
Glucose-Capillary: 98 mg/dL (ref 70–99)

## 2018-11-12 SURGERY — ESOPHAGOGASTRODUODENOSCOPY (EGD) WITH PROPOFOL
Anesthesia: Monitor Anesthesia Care

## 2018-11-12 MED ORDER — PROPOFOL 10 MG/ML IV BOLUS
INTRAVENOUS | Status: DC | PRN
  Administered 2018-11-12: 20 mg via INTRAVENOUS

## 2018-11-12 MED ORDER — PROPOFOL 10 MG/ML IV BOLUS
INTRAVENOUS | Status: AC
  Filled 2018-11-12: qty 20

## 2018-11-12 MED ORDER — PROPOFOL 500 MG/50ML IV EMUL
INTRAVENOUS | Status: DC | PRN
  Administered 2018-11-12: 100 ug/kg/min via INTRAVENOUS

## 2018-11-12 MED ORDER — PROPOFOL 10 MG/ML IV BOLUS
INTRAVENOUS | Status: AC
  Filled 2018-11-12: qty 60

## 2018-11-12 MED ORDER — DEXTROSE 50 % IV SOLN
12.5000 g | INTRAVENOUS | Status: AC
  Administered 2018-11-12: 12.5 g via INTRAVENOUS

## 2018-11-12 MED ORDER — SODIUM CHLORIDE 0.9 % IV SOLN
INTRAVENOUS | Status: DC
  Administered 2018-11-12 (×2): via INTRAVENOUS

## 2018-11-12 MED ORDER — LIDOCAINE 2% (20 MG/ML) 5 ML SYRINGE
INTRAMUSCULAR | Status: DC | PRN
  Administered 2018-11-12: 100 mg via INTRAVENOUS

## 2018-11-12 SURGICAL SUPPLY — 25 items

## 2018-11-12 NOTE — Anesthesia Preprocedure Evaluation (Signed)
Anesthesia Evaluation  Patient identified by MRN, date of birth, ID band Patient awake    Reviewed: Allergy & Precautions, H&P , NPO status , Patient's Chart, lab work & pertinent test results, reviewed documented beta blocker date and time   Airway Mallampati: II  TM Distance: >3 FB Neck ROM: Full    Dental no notable dental hx. (+) Teeth Intact, Dental Advisory Given   Pulmonary asthma , COPD,  COPD inhaler, Current Smoker,    Pulmonary exam normal breath sounds clear to auscultation       Cardiovascular hypertension, Pt. on medications and Pt. on home beta blockers + CAD, + Cardiac Stents, + Peripheral Vascular Disease and +CHF  + pacemaker  Rhythm:Regular Rate:Normal     Neuro/Psych  Headaches, Anxiety negative psych ROS   GI/Hepatic negative GI ROS, Neg liver ROS,   Endo/Other  diabetes, Insulin Dependent  Renal/GU Renal InsufficiencyRenal disease  negative genitourinary   Musculoskeletal   Abdominal   Peds  Hematology negative hematology ROS (+)   Anesthesia Other Findings   Reproductive/Obstetrics negative OB ROS                             Anesthesia Physical  Anesthesia Plan  ASA: III  Anesthesia Plan: MAC   Post-op Pain Management:    Induction: Intravenous  PONV Risk Score and Plan: 2 and Ondansetron and Midazolam  Airway Management Planned: Mask and Natural Airway  Additional Equipment:   Intra-op Plan:   Post-operative Plan: Extubation in OR  Informed Consent: I have reviewed the patients History and Physical, chart, labs and discussed the procedure including the risks, benefits and alternatives for the proposed anesthesia with the patient or authorized representative who has indicated his/her understanding and acceptance.     Dental advisory given  Plan Discussed with: CRNA, Anesthesiologist and Surgeon  Anesthesia Plan Comments:         Anesthesia  Quick Evaluation

## 2018-11-12 NOTE — H&P (Signed)
Subjective:   Patient is a 68 y.o. male presents with iron deficiency anemia and chronic blood loss.  He has had a history of colon polyps, intestinal carcinoid, type 2 diabetes.  He has renal failure and is borderline for dialysis.  He had an attempted colonoscopy recently in the [facility] endoscopy unit.  He had bleeding AVM this was clipped.  Some polyps were left and were not removed.  He is still receiving IV iron.  He is on Eliquis due to coronary disease and a history of atrial flutter with a history of a TIA followed by Dr. [name] in [place].  We are doing EGD and colon at the hospital to evaluate for source of bleeding with the APC on standby to cauterize any bleeding AVMs found.. Procedure including risks and benefits discussed in office.  Patient Active Problem List   Diagnosis Date Noted  . 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[place]) 03/18/2018  . Chronic renal insufficiency, stage 3 (moderate) ([place]) 03/18/2018  . Diffuse wheezing 12/02/2017  . OSA and COPD overlap syndrome ([place]) 12/02/2017  . Sleeps in sitting position due to orthopnea 12/02/2017  . Pacemaker 12/02/2017  . Bradycardia with 31-40 beats per minute 12/02/2017  . Coronary artery disease due to calcified coronary lesion 12/02/2017  . Insomnia due to medical condition 12/02/2017  . Snoring 12/02/2017  . Chronic diastolic CHF (congestive heart failure) ([place]) 05/10/2017  . Atypical chest pain   . Chest pain 05/09/2017  . Atrial flutter ([place]) 05/09/2017  . Acute renal failure superimposed on stage 4 chronic kidney disease ([place]) 05/09/2017  . Hypokalemia 04/05/2016  . Diabetes mellitus with nephropathy ([place]) 04/05/2016  . Hyponatremia 04/05/2016  . Hematuria 04/05/2016  . Diarrhea 04/05/2016  . Bilateral diabetic foot ulcer associated with secondary diabetes mellitus ([place]) 04/05/2016  . Streptococcal bacteremia 04/05/2016  . Cellulitis of leg, left 04/05/2016  . Sepsis ([place]) 04/04/2016  . CAD (coronary artery disease)  11/10/2015  . Bilateral lower extremity edema 07/25/2015  . Foot infection 09/27/2014  . Diabetic foot infection ([place]) 09/27/2014  . CKD stage 3 due to type 2 diabetes mellitus ([place])   . Diabetes mellitus with renal manifestations, uncontrolled ([place])   . Benign essential HTN   . ED (erectile dysfunction) 06/09/2012  . Hypertension 03/11/2011  . OSTEOMYELITIS, ACUTE, ANKLE/FOOT 07/24/2010  . EDEMA 02/15/2010  . MORBID OBESITY 07/05/2009  . TOBACCO ABUSE 07/05/2009  . OBSTRUCTIVE SLEEP APNEA 07/05/2009  . COPD (chronic obstructive pulmonary disease) ([place]) 07/05/2009  . 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 HYPERLIPIDEMIA 03/30/2008  . Generalized anxiety disorder 03/30/2008  . [place] DISEASE 03/30/2008   Past Medical History:  Diagnosis Date  . AAA (abdominal aortic aneurysm) ([place]) 06/2016   Mild increase in size of 3.4 cm infrarenal abdominal aortic aneurysm  . Anemia   . Aortic atherosclerosis ([place])   . Asthma   . Atrial flutter ([place])   . AVF (arteriovenous fistula) ([facility])    Left forearm  . CAD (coronary artery disease)    a. s/p prior stenting of RCA in 1999 b. cath in 2003 showing moderate CAD c. NST in 2016 showing small area of ischemic and low-risk  . CHF (congestive heart failure) ([place])   . CKD (chronic kidney disease), stage V ([place])    kidney transplant evaluation  . COPD (chronic obstructive pulmonary disease) ([place])    with ongoing tobacco use and patient failed sham takes  . Diverticulosis 2018   Mild sigmoid colon diverticulosis.  Marland Kitchen DM2 (diabetes mellitus, type 2) ([place])   .  Dyslipidemia   . Edema    chronic lower extremity secondary to right heart failure and chronic venous insufficiency  . Fatigue   . Fatty liver 2010   Mild  . Headache   . HTN (hypertension)   . Morbid obesity ([place])   . Multiple pulmonary nodules 05/2018   Bilateral  . Pneumonia   . Presence of permanent cardiac pacemaker   . 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   Marland Kitchen Reflux esophagitis    hx  . Sleep apnea    PT STATES ONE TEST WAS POSITIVE AND ANOTHER TEST WAS NEGATIVE  . Two-vessel coronary artery disease    moderate. by cath in 2003. Status post stenting of the mdi RCA November 1999 normal left ventricular ejection fraction  . Wears glasses   . Wears hearing aid    B/L    Past Surgical History:  Procedure Laterality Date  . ABDOMINAL SURGERY    . APPENDECTOMY    . AV FISTULA PLACEMENT Right 03/11/2018   Procedure: CREATION Brachiocephalic Fistula RIGHT ARM;  Surgeon: Rosetta Posner, MD;  Location: [facility];  Service: Vascular;  Laterality: Right;  . BACK SURGERY     x3  . CARDIAC CATHETERIZATION     stent  . CHOLECYSTECTOMY    . CLOSED REDUCTION SHOULDER DISLOCATION    . COLONOSCOPY W/ BIOPSIES AND POLYPECTOMY    . diabetic ulcers    . GROIN EXPLORATION    . INSERTION OF ARTERIOVENOUS (AV) ARTEGRAFT ARM Left 03/18/2018   Procedure: INSERTION OF ARTERIOVENOUS (AV) FISTULA;  Surgeon: Rosetta Posner, MD;  Location: [place] [facility];  Service: Vascular;  Laterality: Left;  . LIGATION ARTERIOVENOUS GORTEX GRAFT Right 03/18/2018   Procedure: LIGATION ARTERIOVENOUS FISTULA;  Surgeon: Rosetta Posner, MD;  Location: [facility];  Service: Vascular;  Laterality: Right;  . OSTECTOMY Left 04/23/2017   Procedure: OSTECTOMY LEFT GREAT TOE;  Surgeon: [name], DPM;  Location: [facility];  Service: Podiatry;  Laterality: Left;  left great toe  . TUMOR REMOVAL     small intestine x3    Medications Prior to Admission  Medication Sig Dispense Refill Last Dose  . acetaminophen (TYLENOL) 325 MG tablet Take 975 mg by mouth daily.      Marland Kitchen albuterol (VENTOLIN HFA) 108 (90 BASE) MCG/ACT inhaler Inhale 2 puffs into the lungs every 6 (six) hours as needed for wheezing or shortness of breath.      . carvedilol (COREG) 25 MG tablet TAKE ONE TABLET BY MOUTH TWICE DAILY. (Patient taking differently: Take 25 mg by mouth 2 (two) times a day. ) 180 tablet 2  11/12/2018 at Unknown time  . Cholecalciferol (VITAMIN D3) 2000 units TABS Take 2,000 Units by mouth daily.    11/11/2018 at Unknown time  . diltiazem (CARDIZEM) 30 MG tablet TAKE ONE TABLET BY MOUTH TWICE DAILY. 60 tablet 6 11/12/2018 at Unknown time  . epoetin alfa (EPOGEN) 4000 UNIT/ML injection Inject 4,000 Units into the vein every 14 (fourteen) days.   Past Month at Unknown time  . hydrALAZINE (APRESOLINE) 100 MG tablet TAKE ONE TABLET BY MOUTH THREE TIMES DAILY (Patient taking differently: Take 100 mg by mouth 3 (three) times daily. ) 270 tablet 2 11/12/2018 at Unknown time  . insulin glargine (LANTUS) 100 UNIT/ML injection Inject 35 Units into the skin daily.    11/11/2018 at Unknown time  . LINZESS 290 MCG CAPS capsule Take 290 mcg by mouth  daily as needed (for constipation).   6 11/11/2018 at Unknown time  . Melatonin 10 MG TABS Take 20 mg by mouth at bedtime.    11/11/2018 at Unknown time  . Multiple Vitamin (MULTIVITAMIN WITH MINERALS) TABS tablet Take 1 tablet by mouth daily.   11/11/2018 at Unknown time  . omeprazole (PRILOSEC) 20 MG capsule Take 20 mg daily by mouth.   11/12/2018 at Unknown time  . polyethylene glycol (MIRALAX / GLYCOLAX) packet Take 17 g by mouth daily. With morning coffee   Past Week at Unknown time  . potassium chloride SA (K-DUR,KLOR-CON) 20 MEQ tablet Take 1 tablet (20 mEq total) by mouth daily. 30 tablet 0 11/11/2018 at Unknown time  . QVAR REDIHALER 80 MCG/ACT inhaler Inhale 2 puffs into the lungs 2 (two) times daily as needed (respiratory issues.).   12 11/12/2018 at Unknown time  . rosuvastatin (CRESTOR) 20 MG tablet Take 20 mg by mouth daily.   11/12/2018 at Unknown time  . sildenafil (VIAGRA) 100 MG tablet Take 1 tablet (100 mg total) by mouth as needed for erectile dysfunction. (Patient taking differently: Take 100 mg by mouth daily as needed for erectile dysfunction. ) 10 tablet 0 Past Week at Unknown time  . torsemide (DEMADEX) 100 MG tablet Take 50 mg by mouth 2  (two) times daily. Changed by Dr. Lowanda Foster this March.  4 11/12/2018 at Unknown time  . atorvastatin (LIPITOR) 40 MG tablet Take 1 tablet (40 mg total) by mouth every evening. 90 tablet 2 Not Taking at Unknown time  . oxyCODONE-acetaminophen (PERCOCET) 5-325 MG tablet Take 1 tablet by mouth every 6 (six) hours as needed for severe pain. 8 tablet 0   . silver sulfADIAZINE (SILVADENE) 1 % cream Apply 1 application topically daily as needed (For wound care with dressing).    More than a month at Unknown time   No Known Allergies  Social History   Tobacco Use  . Smoking status: Current Every Day Smoker    Packs/day: 1.00    Years: 40.00    Pack years: 40.00    Types: Cigarettes    Start date: 05/20/1968  . Smokeless tobacco: Never Used  Substance Use Topics  . Alcohol use: Not Currently    Alcohol/week: 0.0 standard drinks    Comment: rare    Family History  Problem Relation Age of Onset  . Diabetes Mother   . Alcohol abuse Father      Objective:   Patient Vitals for the past 8 hrs:  Temp Temp src Pulse Resp Height Weight  11/12/18 1234 (!) 97.3 F (36.3 C) Temporal 60 20 6\' 4"  (1.93 m) 104.3 kg   No intake/output data recorded. No intake/output data recorded.   See MD Preop evaluation      Assessment:   1.  Iron deficiency anemia due to chronic blood loss 2.  History of colon polyps 3.  History of carcinoid of the GI tract 4.  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  CKD.  This is stage IV and he has had dialysis catheters placed in anticipation of dialysis in the near future 6.  PAF chronically anticoagulated 7.  History of pacemaker 8.  History of bleeding cecal AVM  Plan:   We will proceed at this time with EGD and colonoscopy with plans to fulgurate any bleeding AVMs with the APC.  This is been discussed in detail with the patient.

## 2018-11-12 NOTE — Progress Notes (Signed)
Hypoglycemic Event  CBG: 59  Treatment: D50 25 mL (12.5 gm)  Symptoms: None  Follow-up CBG: Time: 1255 CBG Result:98  Possible Reasons for Event: Inadequate meal intake  Comments/MD notified: Dr. Ambrose Pancoast aware    [name], Kaylyn Layer

## 2018-11-12 NOTE — Op Note (Signed)
[facility] Patient Name: [name] Procedure Date: 11/12/2018 MRN: [id number] Attending MD: Nancy Fetter Dr., MD Date of Birth: [birth year] CSN: [id number] Age: 68 Admit Type: Outpatient Procedure:                Upper GI endoscopy and Biopsy Indications:              Iron deficiency anemia secondary to chronic blood                            loss, Heme positive stool Providers:                Jeneen Rinks L. Laurian Edrington Dr., MD, Raynelle Bring, RN, Cherylynn Ridges, Technician, Adair Laundry, CRNA Referring MD:              Medicines:                Monitored Anesthesia Care Complications:            No immediate complications. Estimated Blood Loss:     Estimated blood loss: none. Procedure:                Pre-Anesthesia Assessment:                           - Prior to the procedure, a History and Physical                            was performed, and patient medications and                            allergies were reviewed. The patient's tolerance of                            previous anesthesia was also reviewed. The risks                            and benefits of the procedure and the sedation                            options and risks were discussed with the patient.                            All questions were answered, and informed consent                            was obtained. Prior Anticoagulants: The patient has                            taken Eliquis (apixaban), last dose was 7 days                            prior to procedure. ASA Grade Assessment: III - A  patient with severe systemic disease. After                            reviewing the risks and benefits, the patient was                            deemed in satisfactory condition to undergo the                            procedure.                           After obtaining informed consent, the endoscope was                            passed under  direct vision. Throughout the                            procedure, the patient's blood pressure, pulse, and                            oxygen saturations were monitored continuously. The                            GIF-H190 ([id number] Olympus gastroscope was                            introduced through the mouth, and advanced to the                            third part of duodenum. The upper GI endoscopy was                            accomplished without difficulty. The patient                            tolerated the procedure well. Findings:      The esophagus was normal.      A few 5 to 7 mm sessile polyps with no stigmata of recent bleeding were       found in the gastric antrum. Biopsies were taken with a cold forceps for       histology. The pathology specimen was placed into Bottle Number 1.      The examined duodenum was normal. Impression:               - Normal esophagus.                           - A few gastric polyps. Biopsied.                           - Normal examined duodenum. Moderate Sedation:      MAC by anesthesia Recommendation:           - Patient has a contact number available for  emergencies. The signs and symptoms of potential                            delayed complications were discussed with the                            patient. Return to normal activities tomorrow.                            Written discharge instructions were provided to the                            patient.                           - Resume previous diet.                           - Continue present medications.                           - No aspirin, ibuprofen, naproxen, or other                            non-steroidal anti-inflammatory drugs for 5 days                            after polyp removal.                           - Return to endoscopist in 1 month. Procedure Code(s):        --- Professional ---                           [phone],  Esophagogastroduodenoscopy, flexible,                            transoral; with biopsy, single or multiple Diagnosis Code(s):        --- Professional ---                           K31.7, Polyp of stomach and duodenum                           D50.0, Iron deficiency anemia secondary to blood                            loss (chronic)                           R19.5, Other fecal abnormalities CPT copyright 2019 [employer]. All rights reserved. The codes documented in this report are preliminary and upon coder review may  be revised to meet current compliance requirements. Nancy Fetter Dr., MD 11/12/2018 2:52:29 PM This report has been signed electronically. Number of Addenda: 0

## 2018-11-12 NOTE — Anesthesia Postprocedure Evaluation (Signed)
Anesthesia Post Note  Patient: [name]  Procedure(s) Performed: ESOPHAGOGASTRODUODENOSCOPY (EGD) WITH PROPOFOL (N/A ) COLONOSCOPY WITH PROPOFOL (N/A ) BIOPSY POLYPECTOMY HEMOSTASIS CLIP PLACEMENT     Patient location during evaluation: PACU Anesthesia Type: MAC Level of consciousness: awake and alert Pain management: pain level controlled Vital Signs Assessment: post-procedure vital signs reviewed and stable Respiratory status: spontaneous breathing, nonlabored ventilation, respiratory function stable and patient connected to nasal cannula oxygen Cardiovascular status: stable and blood pressure returned to baseline Postop Assessment: no apparent nausea or vomiting Anesthetic complications: no    Last Vitals:  Vitals:   11/12/18 1234  Pulse: 60  Resp: 20  Temp: (!) 36.3 C    Last Pain:  Vitals:   11/12/18 1234  TempSrc: Temporal  PainSc: 0-No pain                 Jalasia Eskridge

## 2018-11-12 NOTE — Op Note (Signed)
[facility] Patient Name: [name] Procedure Date: 11/12/2018 MRN: [id number] Attending MD: Nancy Fetter Dr., MD Date of Birth: 1951/04/23 CSN: [id number] Age: 68 Admit Type: Outpatient Procedure:                Colonoscopy polypectomy and endoclip Indications:              Heme positive stool, Iron deficiency anemia                            secondary to chronic blood loss Providers:                Jeneen Rinks L. Deya Bigos Dr., MD, Raynelle Bring, RN, Cherylynn Ridges, Technician, Adair Laundry, CRNA Referring MD:              Medicines:                Monitored Anesthesia Care Complications:            No immediate complications. Estimated Blood Loss:     Estimated blood loss was minimal. Procedure:                Pre-Anesthesia Assessment:                           - Prior to the procedure, a History and Physical                            was performed, and patient medications and                            allergies were reviewed. The patient's tolerance of                            previous anesthesia was also reviewed. The risks                            and benefits of the procedure and the sedation                            options and risks were discussed with the patient.                            All questions were answered, and informed consent                            was obtained. Prior Anticoagulants: The patient has                            taken Eliquis (apixaban), last dose was 7 days                            prior to procedure. ASA Grade Assessment: III - A  patient with severe systemic disease. After                            reviewing the risks and benefits, the patient was                            deemed in satisfactory condition to undergo the                            procedure.                           - Prior to the procedure, a History and Physical                            was  performed, and patient medications and                            allergies were reviewed. The patient's tolerance of                            previous anesthesia was also reviewed. The risks                            and benefits of the procedure and the sedation                            options and risks were discussed with the patient.                            All questions were answered, and informed consent                            was obtained. Prior Anticoagulants: The patient has                            taken Eliquis (apixaban), last dose was 7 days                            prior to procedure. ASA Grade Assessment: III - A                            patient with severe systemic disease. After                            reviewing the risks and benefits, the patient was                            deemed in satisfactory condition to undergo the                            procedure.  After obtaining informed consent, the colonoscope                            was passed under direct vision. Throughout the                            procedure, the patient's blood pressure, pulse, and                            oxygen saturations were monitored continuously. The                            CF-HQ190L ([id number] Olympus colonoscope was                            introduced through the anus and advanced to the the                            cecum, identified by appendiceal orifice and                            ileocecal valve. The colonoscopy was technically                            difficult and complex due to a redundant colon. Pt                            also had great difficulty holding the air.                            [Solution]. The ileocecal valve, appendiceal                            orifice, and rectum were photographed. Scope In: 1:48:38 PM Scope Out: 2:44:05 PM Scope Withdrawal Time: 0 hours 44 minutes 30 seconds  Total Procedure  Duration: 0 hours 55 minutes 27 seconds  Findings:      The perianal and digital rectal examinations were normal.      A single angiodysplastic lesion without bleeding was found in the cecum.       Clip in place from previous procedure. AVM essentially gone.      Three sessile polyps were found in the descending colon. The polyps were       4 to 7 mm in size. These polyps were removed with a hot snare. Resection       and retrieval were complete. The pathology specimen was placed into       Bottle Number 3. To prevent bleeding post-intervention, one hemostatic       clip was successfully placed. There was no bleeding at the end of the       procedure. There was oozing at only 1 polyp      Three sessile polyps were found in the sigmoid colon. The polyps were 5       to 7 mm in size. These polyps were removed with a hot snare. Resection       and retrieval were complete. The  pathology specimen was placed into       Bottle Number 4.      The exam was otherwise without abnormality.      There was a large lipoma, in the descending colon. Biopsies were taken       with a cold forceps for histology. The pathology specimen was placed       into Bottle Number 2. Impression:               - A single non-bleeding colonic angiodysplastic                            lesion.                           - Three 4 to 7 mm polyps in the descending colon,                            removed with a hot snare. Resected and retrieved.                            Clip was placed.                           - Three 5 to 7 mm polyps in the sigmoid colon,                            removed with a hot snare. Resected and retrieved.                           - The examination was otherwise normal.                           - Personal history of colonic polyps. Moderate Sedation:      See anesthesia note, no moderate sedation. Recommendation:           - Patient has a contact number available for                             emergencies. The signs and symptoms of potential                            delayed complications were discussed with the                            patient. Return to normal activities tomorrow.                            Written discharge instructions were provided to the                            patient.                           - Resume previous diet.                           -  Continue present medications.                           - No aspirin, ibuprofen, naproxen, or other                            non-steroidal anti-inflammatory drugs for 5 days                            after polyp removal.                           - Hold Eliquis indefinantly Procedure Code(s):        --- Professional ---                           [phone], Colonoscopy, flexible; with removal of                            tumor(s), polyp(s), or other lesion(s) by snare                            technique Diagnosis Code(s):        --- Professional ---                           K63.5, Polyp of colon                           Z86.010, Personal history of colonic polyps                           D50.0, Iron deficiency anemia secondary to blood                            loss (chronic)                           R19.5, Other fecal abnormalities                           K55.20, Angiodysplasia of colon without hemorrhage CPT copyright 2019 [employer]. All rights reserved. The codes documented in this report are preliminary and upon coder review may  be revised to meet current compliance requirements. Nancy Fetter Dr., MD 11/12/2018 3:06:44 PM This report has been signed electronically. Number of Addenda: 0

## 2018-11-12 NOTE — Discharge Instructions (Addendum)
YOU HAD AN ENDOSCOPIC PROCEDURE TODAY: Refer to the procedure report and other information in the discharge instructions given to you for any specific questions about what was found during the examination. If this information does not answer your questions, please call [facility] office at [phone] to clarify.   YOU SHOULD EXPECT: Some feelings of bloating in the abdomen. Passage of more gas than usual. Walking can help get rid of the air that was put into your GI tract during the procedure and reduce the bloating. If you had a lower endoscopy (such as a colonoscopy or flexible sigmoidoscopy) you may notice spotting of blood in your stool or on the toilet paper. Some abdominal soreness may be present for a day or two, also.  DIET: Your first meal following the procedure should be a light meal and then it is ok to progress to your normal diet. A half-sandwich or bowl of soup is an example of a good first meal. Heavy or fried foods are harder to digest and may make you feel nauseous or bloated. Drink plenty of fluids but you should avoid alcoholic beverages for 24 hours. If you had a esophageal dilation, please see attached instructions for diet   HOLD ELIQUIS NO ASPIRIN.     HOLD ELIQUIS INDEFINATELY, NO ASPIRIN ON NASAIDS  ACTIVITY: Your care partner should take you home directly after the procedure. You should plan to take it easy, moving slowly for the rest of the day. You can resume normal activity the day after the procedure however YOU SHOULD NOT DRIVE, use power tools, machinery or perform tasks that involve climbing or major physical exertion for 24 hours (because of the sedation medicines used during the test).   SYMPTOMS TO REPORT IMMEDIATELY: A gastroenterologist can be reached at any hour. Please call [phone]  for any of the following symptoms:    Following lower endoscopy (colonoscopy, flexible sigmoidoscopy) Excessive amounts of blood in the stool  Significant tenderness,  worsening of abdominal pains  Swelling of the abdomen that is new, acute  Fever of 100 or higher   Following upper endoscopy (EGD, EUS, ERCP, esophageal dilation) Vomiting of blood or coffee ground material  New, significant abdominal pain  New, significant chest pain or pain under the shoulder blades  Painful or persistently difficult swallowing  New shortness of breath  Black, tarry-looking or red, bloody stools  FOLLOW UP:  If any biopsies were taken you will be contacted by phone or by letter within the next 1-3 weeks. Call [phone]  if you have not heard about the biopsies in 3 weeks.  Please also call with any specific questions about appointments or follow up tests.

## 2018-11-12 NOTE — Transfer of Care (Signed)
Immediate Anesthesia Transfer of Care Note  Patient: [name]  Procedure(s) Performed: ESOPHAGOGASTRODUODENOSCOPY (EGD) WITH PROPOFOL (N/A ) COLONOSCOPY WITH PROPOFOL (N/A ) HOT HEMOSTASIS (ARGON PLASMA COAGULATION/BICAP) (N/A ) BIOPSY POLYPECTOMY HEMOSTASIS CLIP PLACEMENT  Patient Location: PACU  Anesthesia Type:MAC  Level of Consciousness: sedated  Airway & Oxygen Therapy: Patient Spontanous Breathing and Patient connected to nasal cannula oxygen  Post-op Assessment: Report given to RN and Post -op Vital signs reviewed and stable  Post vital signs: Reviewed and stable  Last Vitals:  Vitals Value Taken Time  BP    Temp    Pulse    Resp    SpO2      Last Pain:  Vitals:   11/12/18 1234  TempSrc: Temporal  PainSc: 0-No pain         Complications: No apparent anesthesia complications

## 2018-11-13 ENCOUNTER — Encounter (HOSPITAL_COMMUNITY): Payer: Self-pay | Admitting: Gastroenterology

## 2018-11-15 ENCOUNTER — Encounter: Payer: Self-pay | Admitting: Cardiology

## 2018-11-15 NOTE — Progress Notes (Signed)
Remote pacemaker transmission.   

## 2018-11-18 ENCOUNTER — Other Ambulatory Visit: Payer: Medicare Other

## 2018-11-19 DIAGNOSIS — M542 Cervicalgia: Secondary | ICD-10-CM | POA: Diagnosis not present

## 2018-11-23 ENCOUNTER — Other Ambulatory Visit (HOSPITAL_COMMUNITY): Payer: Self-pay | Admitting: *Deleted

## 2018-11-23 DIAGNOSIS — E1142 Type 2 diabetes mellitus with diabetic polyneuropathy: Secondary | ICD-10-CM | POA: Diagnosis not present

## 2018-11-23 DIAGNOSIS — L97512 Non-pressure chronic ulcer of other part of right foot with fat layer exposed: Secondary | ICD-10-CM | POA: Diagnosis not present

## 2018-11-24 ENCOUNTER — Encounter (HOSPITAL_COMMUNITY): Payer: Self-pay

## 2018-11-24 ENCOUNTER — Other Ambulatory Visit: Payer: Self-pay

## 2018-11-24 ENCOUNTER — Encounter (HOSPITAL_COMMUNITY)
Admission: RE | Admit: 2018-11-24 | Discharge: 2018-11-24 | Disposition: A | Discharge status: Home / Self Care | Payer: Medicare Other | Source: Ambulatory Visit | Attending: Nephrology | Admitting: Nephrology

## 2018-11-24 DIAGNOSIS — D631 Anemia in chronic kidney disease: Secondary | ICD-10-CM | POA: Diagnosis not present

## 2018-11-24 DIAGNOSIS — N184 Chronic kidney disease, stage 4 (severe): Secondary | ICD-10-CM | POA: Diagnosis not present

## 2018-11-24 LAB — PROTEIN / CREATININE RATIO, URINE
Creatinine, Urine: 56.39 mg/dL
Protein Creatinine Ratio: 3.55 mg/mg{Cre} — ABNORMAL HIGH (ref 0.00–0.15)
Total Protein, Urine: 200 mg/dL

## 2018-11-24 LAB — RENAL FUNCTION PANEL
Albumin: 3.6 g/dL (ref 3.5–5.0)
Anion gap: 13 (ref 5–15)
BUN: 93 mg/dL — ABNORMAL HIGH (ref 8–23)
CO2: 22 mmol/L (ref 22–32)
Calcium: 8.6 mg/dL — ABNORMAL LOW (ref 8.9–10.3)
Chloride: 105 mmol/L (ref 98–111)
Creatinine, Ser: 5.6 mg/dL — ABNORMAL HIGH (ref 0.61–1.24)
GFR calc Af Amer: 11 mL/min — ABNORMAL LOW (ref >60)
GFR calc non Af Amer: 10 mL/min — ABNORMAL LOW (ref >60)
Glucose, Bld: 84 mg/dL (ref 70–99)
Phosphorus: 6 mg/dL — ABNORMAL HIGH (ref 2.5–4.6)
Potassium: 4 mmol/L (ref 3.5–5.1)
Sodium: 140 mmol/L (ref 135–145)

## 2018-11-24 LAB — IRON AND TIBC
Iron: 50 ug/dL (ref 45–182)
Saturation Ratios: 18 % (ref 17.9–39.5)
TIBC: 285 ug/dL (ref 250–450)
UIBC: 235 ug/dL

## 2018-11-24 LAB — POCT HEMOGLOBIN-HEMACUE: Hemoglobin: 12.3 g/dL — ABNORMAL LOW (ref 13.0–17.0)

## 2018-11-24 LAB — FERRITIN: Ferritin: 99 ng/mL (ref 24–336)

## 2018-11-24 NOTE — Progress Notes (Signed)
Results for [name], [name] (MRN [id number]) as of 11/24/2018 15:12  Ref. Range 11/24/2018 11:30  Hemoglobin Latest Ref Range: 13.0 - 17.0 g/dL 12.3 (L)  Results for [name], [name] (MRN [id number]) as of 11/24/2018 15:12  Ref. Range 11/24/2018 11:15  Sodium Latest Ref Range: 135 - 145 mmol/L 140  Potassium Latest Ref Range: 3.5 - 5.1 mmol/L 4.0  Chloride Latest Ref Range: 98 - 111 mmol/L 105  CO2 Latest Ref Range: 22 - 32 mmol/L 22  Glucose Latest Ref Range: 70 - 99 mg/dL 84  BUN Latest Ref Range: 8 - 23 mg/dL 93 (H)  Creatinine Latest Ref Range: 0.61 - 1.24 mg/dL 5.60 (H)  Calcium Latest Ref Range: 8.9 - 10.3 mg/dL 8.6 (L)  Anion gap Latest Ref Range: 5 - 15  13  Phosphorus Latest Ref Range: 2.5 - 4.6 mg/dL 6.0 (H)  Albumin Latest Ref Range: 3.5 - 5.0 g/dL 3.6  GFR, Est Non African American Latest Ref Range: >60 mL/min 10 (L)  GFR, Est African American Latest Ref Range: >60 mL/min 11 (L)  Iron Latest Ref Range: 45 - 182 ug/dL 50  UIBC Latest Units: ug/dL 235  TIBC Latest Ref Range: 250 - 450 ug/dL 285  Saturation Ratios Latest Ref Range: 17.9 - 39.5 % 18  Ferritin Latest Ref Range: 24 - 336 ng/mL 99  Total Protein, Urine Latest Units: mg/dL 200  Protein Creatinine Ratio Latest Ref Range: 0.00 - 0.15 mg/mgCre 3.55 (H)  Creatinine, Urine Latest Units: mg/dL 56.39

## 2018-11-25 ENCOUNTER — Telehealth: Payer: Self-pay | Admitting: Pulmonary Disease

## 2018-11-25 LAB — PTH, INTACT AND CALCIUM
Calcium, Total (PTH): 8.7 mg/dL (ref 8.6–10.2)
PTH: 89 pg/mL — ABNORMAL HIGH (ref 15–65)

## 2018-11-25 LAB — VITAMIN D 25 HYDROXY (VIT D DEFICIENCY, FRACTURES): Vit D, 25-Hydroxy: 37.4 ng/mL (ref 30.0–100.0)

## 2018-11-25 NOTE — Telephone Encounter (Signed)
Spoke with Elmyra Ricks. She is needing a copy of the pt's most recent PFT. This has been faxed to her. Nothing further was needed.

## 2018-11-25 NOTE — Telephone Encounter (Signed)
LMTCB x1 for [place] with [facility].

## 2018-12-04 DIAGNOSIS — Z794 Long term (current) use of insulin: Secondary | ICD-10-CM | POA: Diagnosis not present

## 2018-12-04 DIAGNOSIS — N184 Chronic kidney disease, stage 4 (severe): Secondary | ICD-10-CM | POA: Diagnosis not present

## 2018-12-04 DIAGNOSIS — Z125 Encounter for screening for malignant neoplasm of prostate: Secondary | ICD-10-CM | POA: Diagnosis not present

## 2018-12-04 DIAGNOSIS — Z01818 Encounter for other preprocedural examination: Secondary | ICD-10-CM | POA: Diagnosis not present

## 2018-12-04 DIAGNOSIS — Z114 Encounter for screening for human immunodeficiency virus [HIV]: Secondary | ICD-10-CM | POA: Diagnosis not present

## 2018-12-04 DIAGNOSIS — E119 Type 2 diabetes mellitus without complications: Secondary | ICD-10-CM | POA: Diagnosis not present

## 2018-12-04 DIAGNOSIS — N185 Chronic kidney disease, stage 5: Secondary | ICD-10-CM | POA: Diagnosis not present

## 2018-12-04 DIAGNOSIS — Z1159 Encounter for screening for other viral diseases: Secondary | ICD-10-CM | POA: Diagnosis not present

## 2018-12-04 DIAGNOSIS — Z7682 Awaiting organ transplant status: Secondary | ICD-10-CM | POA: Diagnosis not present

## 2018-12-07 DIAGNOSIS — E1142 Type 2 diabetes mellitus with diabetic polyneuropathy: Secondary | ICD-10-CM | POA: Diagnosis not present

## 2018-12-07 DIAGNOSIS — L97512 Non-pressure chronic ulcer of other part of right foot with fat layer exposed: Secondary | ICD-10-CM | POA: Diagnosis not present

## 2018-12-08 ENCOUNTER — Encounter (HOSPITAL_COMMUNITY)
Admission: RE | Admit: 2018-12-08 | Discharge: 2018-12-08 | Disposition: A | Discharge status: Home / Self Care | Payer: Medicare Other | Source: Ambulatory Visit | Attending: Nephrology | Admitting: Nephrology

## 2018-12-08 ENCOUNTER — Other Ambulatory Visit: Payer: Self-pay

## 2018-12-08 DIAGNOSIS — N184 Chronic kidney disease, stage 4 (severe): Secondary | ICD-10-CM | POA: Diagnosis not present

## 2018-12-08 DIAGNOSIS — D631 Anemia in chronic kidney disease: Secondary | ICD-10-CM | POA: Diagnosis not present

## 2018-12-08 LAB — POCT HEMOGLOBIN-HEMACUE: Hemoglobin: 12.5 g/dL — ABNORMAL LOW (ref 13.0–17.0)

## 2018-12-09 ENCOUNTER — Telehealth: Payer: Self-pay | Admitting: Cardiology

## 2018-12-09 DIAGNOSIS — I2510 Atherosclerotic heart disease of native coronary artery without angina pectoris: Secondary | ICD-10-CM

## 2018-12-09 NOTE — Telephone Encounter (Signed)
We are happy to coordinate the heart studies. Ok to order echo and lexiscan, the CT abdomen would need to be coordinated with his pcp

## 2018-12-09 NOTE — Telephone Encounter (Signed)
Pre-cert Verification for the following procedure   Lexiscan & Echo scheduled for 12-15-2018 at [facility].

## 2018-12-09 NOTE — Telephone Encounter (Signed)
Patient being evaluated kidney transplant.  He was told he needs to have a stress test and echo done.

## 2018-12-09 NOTE — Telephone Encounter (Signed)
Pt is under evaluation for kidney transplant and needs echo, stress test, and CT of abd - pt wants this done here - will call coordinator at [facility] per pt request to make sure they have not already scheduled theses and forward to provider if ok to order test

## 2018-12-09 NOTE — Telephone Encounter (Signed)
Pt aware and will see Dr Quintin Alto next week to coordinate CT of ABD - will place orders for echo and lexi and forward to schedulers - also spoke with Shauna at [facility] per pt request that we would order the echo and lexiscan

## 2018-12-15 ENCOUNTER — Ambulatory Visit (HOSPITAL_BASED_OUTPATIENT_CLINIC_OR_DEPARTMENT_OTHER)
Admission: RE | Admit: 2018-12-15 | Discharge: 2018-12-15 | Disposition: A | Discharge status: Home / Self Care | Payer: Medicare Other | Source: Ambulatory Visit | Attending: Cardiology | Admitting: Cardiology

## 2018-12-15 ENCOUNTER — Encounter (HOSPITAL_BASED_OUTPATIENT_CLINIC_OR_DEPARTMENT_OTHER)
Admission: RE | Admit: 2018-12-15 | Discharge: 2018-12-15 | Disposition: A | Discharge status: Home / Self Care | Payer: Medicare Other | Source: Ambulatory Visit | Attending: Cardiology | Admitting: Cardiology

## 2018-12-15 ENCOUNTER — Other Ambulatory Visit: Payer: Self-pay

## 2018-12-15 ENCOUNTER — Ambulatory Visit (HOSPITAL_COMMUNITY)
Admission: RE | Admit: 2018-12-15 | Discharge: 2018-12-15 | Disposition: A | Discharge status: Home / Self Care | Payer: Medicare Other | Source: Ambulatory Visit | Attending: Cardiology | Admitting: Cardiology

## 2018-12-15 DIAGNOSIS — I2510 Atherosclerotic heart disease of native coronary artery without angina pectoris: Secondary | ICD-10-CM | POA: Diagnosis not present

## 2018-12-15 LAB — NM MYOCAR MULTI W/SPECT W/WALL MOTION / EF
LV dias vol: 164 mL (ref 62–150)
LV sys vol: 89 mL
Peak HR: 70 {beats}/min
RATE: 0.35
Rest HR: 67 {beats}/min
SDS: 2
SRS: 5
SSS: 7
TID: 1.13

## 2018-12-15 MED ORDER — TECHNETIUM TC 99M TETROFOSMIN IV KIT
10.0000 | PACK | Freq: Once | INTRAVENOUS | Status: AC | PRN
  Administered 2018-12-15: 10.7 via INTRAVENOUS

## 2018-12-15 MED ORDER — REGADENOSON 0.4 MG/5ML IV SOLN
INTRAVENOUS | Status: AC
  Administered 2018-12-15: 0.4 mg via INTRAVENOUS
  Filled 2018-12-15: qty 5

## 2018-12-15 MED ORDER — SODIUM CHLORIDE 0.9% FLUSH
INTRAVENOUS | Status: AC
  Administered 2018-12-15: 10 mL via INTRAVENOUS
  Filled 2018-12-15: qty 10

## 2018-12-15 MED ORDER — TECHNETIUM TC 99M TETROFOSMIN IV KIT
30.0000 | PACK | Freq: Once | INTRAVENOUS | Status: AC | PRN
  Administered 2018-12-15: 30 via INTRAVENOUS

## 2018-12-15 NOTE — Progress Notes (Signed)
*  PRELIMINARY RESULTS* Echocardiogram 2D Echocardiogram has been performed.  Samuel Germany 12/15/2018, 12:57 PM

## 2018-12-17 ENCOUNTER — Telehealth: Payer: Self-pay | Admitting: *Deleted

## 2018-12-17 NOTE — Telephone Encounter (Signed)
Pt voiced understanding - LM with Truman Hayward at [facility] to see where to fax results to

## 2018-12-17 NOTE — Telephone Encounter (Signed)
-----   Message from Arnoldo Lenis, MD sent at 12/17/2018 12:18 PM EDT ----- Echo and stress test look good, nothing that should limit his candidacy for transplant. Can we check with him where we need to forward results   Zandra Abts MD

## 2018-12-18 NOTE — Telephone Encounter (Signed)
Faxed to [facility] [phone]

## 2018-12-21 DIAGNOSIS — E1142 Type 2 diabetes mellitus with diabetic polyneuropathy: Secondary | ICD-10-CM | POA: Diagnosis not present

## 2018-12-21 DIAGNOSIS — L820 Inflamed seborrheic keratosis: Secondary | ICD-10-CM | POA: Diagnosis not present

## 2018-12-21 DIAGNOSIS — E782 Mixed hyperlipidemia: Secondary | ICD-10-CM | POA: Diagnosis not present

## 2018-12-21 DIAGNOSIS — L97512 Non-pressure chronic ulcer of other part of right foot with fat layer exposed: Secondary | ICD-10-CM | POA: Diagnosis not present

## 2018-12-21 DIAGNOSIS — I4891 Unspecified atrial fibrillation: Secondary | ICD-10-CM | POA: Diagnosis not present

## 2018-12-21 DIAGNOSIS — D485 Neoplasm of uncertain behavior of skin: Secondary | ICD-10-CM | POA: Diagnosis not present

## 2018-12-21 DIAGNOSIS — Z1283 Encounter for screening for malignant neoplasm of skin: Secondary | ICD-10-CM | POA: Diagnosis not present

## 2018-12-21 DIAGNOSIS — D225 Melanocytic nevi of trunk: Secondary | ICD-10-CM | POA: Diagnosis not present

## 2018-12-21 DIAGNOSIS — I10 Essential (primary) hypertension: Secondary | ICD-10-CM | POA: Diagnosis not present

## 2018-12-21 DIAGNOSIS — D2239 Melanocytic nevi of other parts of face: Secondary | ICD-10-CM | POA: Diagnosis not present

## 2018-12-21 DIAGNOSIS — N189 Chronic kidney disease, unspecified: Secondary | ICD-10-CM | POA: Diagnosis not present

## 2018-12-21 DIAGNOSIS — E1165 Type 2 diabetes mellitus with hyperglycemia: Secondary | ICD-10-CM | POA: Diagnosis not present

## 2018-12-21 DIAGNOSIS — K219 Gastro-esophageal reflux disease without esophagitis: Secondary | ICD-10-CM | POA: Diagnosis not present

## 2018-12-22 ENCOUNTER — Encounter (HOSPITAL_COMMUNITY)
Admission: RE | Admit: 2018-12-22 | Discharge: 2018-12-22 | Disposition: A | Discharge status: Home / Self Care | Payer: Medicare Other | Source: Ambulatory Visit | Attending: Nephrology | Admitting: Nephrology

## 2018-12-22 ENCOUNTER — Other Ambulatory Visit: Payer: Self-pay

## 2018-12-22 DIAGNOSIS — N184 Chronic kidney disease, stage 4 (severe): Secondary | ICD-10-CM | POA: Insufficient documentation

## 2018-12-22 DIAGNOSIS — D631 Anemia in chronic kidney disease: Secondary | ICD-10-CM | POA: Insufficient documentation

## 2018-12-22 LAB — HEMOGLOBIN AND HEMATOCRIT, BLOOD
HCT: 37.5 % — ABNORMAL LOW (ref 39.0–52.0)
Hemoglobin: 12 g/dL — ABNORMAL LOW (ref 13.0–17.0)

## 2018-12-22 LAB — RENAL FUNCTION PANEL
Albumin: 3.5 g/dL (ref 3.5–5.0)
Anion gap: 13 (ref 5–15)
BUN: 86 mg/dL — ABNORMAL HIGH (ref 8–23)
CO2: 22 mmol/L (ref 22–32)
Calcium: 8.6 mg/dL — ABNORMAL LOW (ref 8.9–10.3)
Chloride: 103 mmol/L (ref 98–111)
Creatinine, Ser: 6.02 mg/dL — ABNORMAL HIGH (ref 0.61–1.24)
GFR calc Af Amer: 10 mL/min — ABNORMAL LOW (ref >60)
GFR calc non Af Amer: 9 mL/min — ABNORMAL LOW (ref >60)
Glucose, Bld: 102 mg/dL — ABNORMAL HIGH (ref 70–99)
Phosphorus: 5.4 mg/dL — ABNORMAL HIGH (ref 2.5–4.6)
Potassium: 3.6 mmol/L (ref 3.5–5.1)
Sodium: 138 mmol/L (ref 135–145)

## 2018-12-22 LAB — IRON AND TIBC
Iron: 44 ug/dL — ABNORMAL LOW (ref 45–182)
Saturation Ratios: 17 % — ABNORMAL LOW (ref 17.9–39.5)
TIBC: 256 ug/dL (ref 250–450)
UIBC: 212 ug/dL

## 2018-12-22 LAB — PROTEIN / CREATININE RATIO, URINE
Creatinine, Urine: 100.16 mg/dL
Total Protein, Urine: 308 mg/dL

## 2018-12-22 LAB — POCT HEMOGLOBIN-HEMACUE: Hemoglobin: 13.4 g/dL (ref 13.0–17.0)

## 2018-12-22 LAB — FERRITIN: Ferritin: 69 ng/mL (ref 24–336)

## 2018-12-23 DIAGNOSIS — E782 Mixed hyperlipidemia: Secondary | ICD-10-CM | POA: Diagnosis not present

## 2018-12-23 DIAGNOSIS — N186 End stage renal disease: Secondary | ICD-10-CM | POA: Diagnosis not present

## 2018-12-23 DIAGNOSIS — Z6829 Body mass index (BMI) 29.0-29.9, adult: Secondary | ICD-10-CM | POA: Diagnosis not present

## 2018-12-23 DIAGNOSIS — E1122 Type 2 diabetes mellitus with diabetic chronic kidney disease: Secondary | ICD-10-CM | POA: Diagnosis not present

## 2018-12-23 DIAGNOSIS — I10 Essential (primary) hypertension: Secondary | ICD-10-CM | POA: Diagnosis not present

## 2018-12-23 DIAGNOSIS — J438 Other emphysema: Secondary | ICD-10-CM | POA: Diagnosis not present

## 2018-12-23 DIAGNOSIS — N529 Male erectile dysfunction, unspecified: Secondary | ICD-10-CM | POA: Diagnosis not present

## 2018-12-23 DIAGNOSIS — I4891 Unspecified atrial fibrillation: Secondary | ICD-10-CM | POA: Diagnosis not present

## 2018-12-23 LAB — PTH, INTACT AND CALCIUM
Calcium, Total (PTH): 8.9 mg/dL (ref 8.6–10.2)
PTH: 73 pg/mL — ABNORMAL HIGH (ref 15–65)

## 2018-12-23 LAB — VITAMIN D 25 HYDROXY (VIT D DEFICIENCY, FRACTURES): Vit D, 25-Hydroxy: 37.2 ng/mL (ref 30.0–100.0)

## 2018-12-23 NOTE — Progress Notes (Signed)
Results for [name], [name] (MRN [id number]) as of 12/23/2018 08:09  Ref. Range 12/22/2018 10:43 12/22/2018 10:51  Sodium Latest Ref Range: 135 - 145 mmol/L 138   Potassium Latest Ref Range: 3.5 - 5.1 mmol/L 3.6   Chloride Latest Ref Range: 98 - 111 mmol/L 103   CO2 Latest Ref Range: 22 - 32 mmol/L 22   Glucose Latest Ref Range: 70 - 99 mg/dL 102 (H)   BUN Latest Ref Range: 8 - 23 mg/dL 86 (H)   Creatinine Latest Ref Range: 0.61 - 1.24 mg/dL 6.02 (H)   Calcium Latest Ref Range: 8.9 - 10.3 mg/dL 8.6 (L)   Anion gap Latest Ref Range: 5 - 15  13   Phosphorus Latest Ref Range: 2.5 - 4.6 mg/dL 5.4 (H)   Albumin Latest Ref Range: 3.5 - 5.0 g/dL 3.5   GFR, Est Non African American Latest Ref Range: >60 mL/min 9 (L)   GFR, Est African American Latest Ref Range: >60 mL/min 10 (L)   Iron Latest Ref Range: 45 - 182 ug/dL 44 (L)   UIBC Latest Units: ug/dL 212   TIBC Latest Ref Range: 250 - 450 ug/dL 256   Saturation Ratios Latest Ref Range: 17.9 - 39.5 % 17 (L)   Ferritin Latest Ref Range: 24 - 336 ng/mL 69   Vitamin D, 25-Hydroxy Latest Ref Range: 30.0 - 100.0 ng/mL 37.2   Hemoglobin Latest Ref Range: 13.0 - 17.0 g/dL 12.0 (L) 13.4  HCT Latest Ref Range: 39.0 - 52.0 % 37.5 (L)   PTH, Intact Latest Ref Range: 15 - 65 pg/mL 73 (H)   Calcium, Total (PTH) Latest Ref Range: 8.6 - 10.2 mg/dL 8.9   PTH Interp Unknown Comment   Total Protein, Urine Latest Units: mg/dL 308   Protein Creatinine Ratio Latest Ref Range: 0.00 - 0.15 mg/mgCre Pend   Creatinine, Urine Latest Units: mg/dL 100.16

## 2018-12-24 DIAGNOSIS — E782 Mixed hyperlipidemia: Secondary | ICD-10-CM | POA: Diagnosis not present

## 2018-12-24 DIAGNOSIS — E1165 Type 2 diabetes mellitus with hyperglycemia: Secondary | ICD-10-CM | POA: Diagnosis not present

## 2018-12-24 DIAGNOSIS — Z6829 Body mass index (BMI) 29.0-29.9, adult: Secondary | ICD-10-CM | POA: Diagnosis not present

## 2018-12-24 DIAGNOSIS — I10 Essential (primary) hypertension: Secondary | ICD-10-CM | POA: Diagnosis not present

## 2018-12-24 DIAGNOSIS — Z0001 Encounter for general adult medical examination with abnormal findings: Secondary | ICD-10-CM | POA: Diagnosis not present

## 2019-01-04 DIAGNOSIS — E1142 Type 2 diabetes mellitus with diabetic polyneuropathy: Secondary | ICD-10-CM | POA: Diagnosis not present

## 2019-01-04 DIAGNOSIS — L97512 Non-pressure chronic ulcer of other part of right foot with fat layer exposed: Secondary | ICD-10-CM | POA: Diagnosis not present

## 2019-01-05 ENCOUNTER — Encounter (HOSPITAL_COMMUNITY)
Admission: RE | Admit: 2019-01-05 | Discharge: 2019-01-05 | Disposition: A | Discharge status: Home / Self Care | Payer: Medicare Other | Source: Ambulatory Visit | Attending: Nephrology | Admitting: Nephrology

## 2019-01-05 ENCOUNTER — Encounter (HOSPITAL_COMMUNITY): Payer: Self-pay

## 2019-01-05 ENCOUNTER — Other Ambulatory Visit: Payer: Self-pay

## 2019-01-05 DIAGNOSIS — D631 Anemia in chronic kidney disease: Secondary | ICD-10-CM | POA: Diagnosis not present

## 2019-01-05 DIAGNOSIS — N184 Chronic kidney disease, stage 4 (severe): Secondary | ICD-10-CM | POA: Diagnosis not present

## 2019-01-05 LAB — RENAL FUNCTION PANEL
Albumin: 3.5 g/dL (ref 3.5–5.0)
Anion gap: 14 (ref 5–15)
BUN: 69 mg/dL — ABNORMAL HIGH (ref 8–23)
CO2: 20 mmol/L — ABNORMAL LOW (ref 22–32)
Calcium: 8.9 mg/dL (ref 8.9–10.3)
Chloride: 103 mmol/L (ref 98–111)
Creatinine, Ser: 4.5 mg/dL — ABNORMAL HIGH (ref 0.61–1.24)
GFR calc Af Amer: 15 mL/min — ABNORMAL LOW (ref >60)
GFR calc non Af Amer: 13 mL/min — ABNORMAL LOW (ref >60)
Glucose, Bld: 112 mg/dL — ABNORMAL HIGH (ref 70–99)
Phosphorus: 4.1 mg/dL (ref 2.5–4.6)
Potassium: 3.7 mmol/L (ref 3.5–5.1)
Sodium: 137 mmol/L (ref 135–145)

## 2019-01-05 LAB — FERRITIN: Ferritin: 72 ng/mL (ref 24–336)

## 2019-01-05 LAB — IRON AND TIBC
Iron: 54 ug/dL (ref 45–182)
Saturation Ratios: 19 % (ref 17.9–39.5)
TIBC: 286 ug/dL (ref 250–450)
UIBC: 232 ug/dL

## 2019-01-05 MED ORDER — EPOETIN ALFA 4000 UNIT/ML IJ SOLN: 4000.0000 [IU] | Freq: Once | INTRAMUSCULAR | Status: DC

## 2019-01-06 LAB — POCT HEMOGLOBIN-HEMACUE: Hemoglobin: 13.1 g/dL (ref 13.0–17.0)

## 2019-01-19 ENCOUNTER — Other Ambulatory Visit: Payer: Self-pay

## 2019-01-19 ENCOUNTER — Encounter (HOSPITAL_COMMUNITY)
Admission: RE | Admit: 2019-01-19 | Discharge: 2019-01-19 | Disposition: A | Discharge status: Home / Self Care | Payer: Medicare Other | Source: Ambulatory Visit | Attending: Nephrology | Admitting: Nephrology

## 2019-01-19 DIAGNOSIS — N184 Chronic kidney disease, stage 4 (severe): Secondary | ICD-10-CM | POA: Insufficient documentation

## 2019-01-19 DIAGNOSIS — D631 Anemia in chronic kidney disease: Secondary | ICD-10-CM | POA: Insufficient documentation

## 2019-01-19 LAB — IRON AND TIBC
Iron: 43 ug/dL — ABNORMAL LOW (ref 45–182)
Saturation Ratios: 16 % — ABNORMAL LOW (ref 17.9–39.5)
TIBC: 276 ug/dL (ref 250–450)
UIBC: 233 ug/dL

## 2019-01-19 LAB — RENAL FUNCTION PANEL
Albumin: 3.6 g/dL (ref 3.5–5.0)
Anion gap: 12 (ref 5–15)
BUN: 76 mg/dL — ABNORMAL HIGH (ref 8–23)
CO2: 24 mmol/L (ref 22–32)
Calcium: 8.9 mg/dL (ref 8.9–10.3)
Chloride: 102 mmol/L (ref 98–111)
Creatinine, Ser: 5.68 mg/dL — ABNORMAL HIGH (ref 0.61–1.24)
GFR calc Af Amer: 11 mL/min — ABNORMAL LOW (ref >60)
GFR calc non Af Amer: 10 mL/min — ABNORMAL LOW (ref >60)
Glucose, Bld: 93 mg/dL (ref 70–99)
Phosphorus: 4.7 mg/dL — ABNORMAL HIGH (ref 2.5–4.6)
Potassium: 4 mmol/L (ref 3.5–5.1)
Sodium: 138 mmol/L (ref 135–145)

## 2019-01-19 LAB — POCT HEMOGLOBIN-HEMACUE: Hemoglobin: 13.2 g/dL (ref 13.0–17.0)

## 2019-01-19 LAB — FERRITIN: Ferritin: 60 ng/mL (ref 24–336)

## 2019-01-19 NOTE — Progress Notes (Signed)
Pt here for lab draw. States Dr Lowanda Foster wants additional labs drawn. Informed pt no new orders available. Informed pt that I would contact [name] at Dr Florentina Addison office. Voiced understanding.

## 2019-01-19 NOTE — Progress Notes (Signed)
Talked with [name] at Dr Florentina Addison office. New lab orders given.

## 2019-01-20 LAB — PTH, INTACT AND CALCIUM
Calcium, Total (PTH): 9.2 mg/dL (ref 8.6–10.2)
PTH: 87 pg/mL — ABNORMAL HIGH (ref 15–65)

## 2019-01-20 LAB — VITAMIN D 25 HYDROXY (VIT D DEFICIENCY, FRACTURES): Vit D, 25-Hydroxy: 37.5 ng/mL (ref 30.0–100.0)

## 2019-01-26 DIAGNOSIS — L280 Lichen simplex chronicus: Secondary | ICD-10-CM | POA: Diagnosis not present

## 2019-01-26 DIAGNOSIS — L905 Scar conditions and fibrosis of skin: Secondary | ICD-10-CM | POA: Diagnosis not present

## 2019-01-26 DIAGNOSIS — C44629 Squamous cell carcinoma of skin of left upper limb, including shoulder: Secondary | ICD-10-CM | POA: Diagnosis not present

## 2019-02-02 ENCOUNTER — Encounter (HOSPITAL_COMMUNITY): Payer: Self-pay

## 2019-02-02 ENCOUNTER — Other Ambulatory Visit: Payer: Self-pay

## 2019-02-02 ENCOUNTER — Encounter (HOSPITAL_COMMUNITY)
Admission: RE | Admit: 2019-02-02 | Discharge: 2019-02-02 | Disposition: A | Discharge status: Home / Self Care | Payer: Medicare Other | Source: Ambulatory Visit | Attending: Nephrology | Admitting: Nephrology

## 2019-02-02 DIAGNOSIS — D631 Anemia in chronic kidney disease: Secondary | ICD-10-CM | POA: Diagnosis not present

## 2019-02-02 DIAGNOSIS — N184 Chronic kidney disease, stage 4 (severe): Secondary | ICD-10-CM | POA: Diagnosis not present

## 2019-02-02 LAB — POCT HEMOGLOBIN-HEMACUE: Hemoglobin: 13 g/dL (ref 13.0–17.0)

## 2019-02-02 MED ORDER — EPOETIN ALFA 4000 UNIT/ML IJ SOLN: 4000.0000 [IU] | Freq: Once | INTRAMUSCULAR | Status: DC

## 2019-02-03 ENCOUNTER — Ambulatory Visit (INDEPENDENT_AMBULATORY_CARE_PROVIDER_SITE_OTHER): Payer: Medicare Other | Admitting: *Deleted

## 2019-02-03 DIAGNOSIS — I495 Sick sinus syndrome: Secondary | ICD-10-CM | POA: Diagnosis not present

## 2019-02-03 LAB — CUP PACEART REMOTE DEVICE CHECK
Battery Remaining Longevity: 168 mo
Battery Remaining Percentage: 100 %
Brady Statistic RA Percent Paced: 39 %
Brady Statistic RV Percent Paced: 8 %
Date Time Interrogation Session: 20200916084200
Implantable Lead Implant Date: 20190514
Implantable Lead Implant Date: 20190514
Implantable Lead Location: 753859
Implantable Lead Location: 753860
Implantable Lead Model: 7741
Implantable Lead Model: 7742
Implantable Lead Serial Number: 1015225
Implantable Lead Serial Number: 1020907
Implantable Pulse Generator Implant Date: 20190514
Lead Channel Impedance Value: 608 Ohm
Lead Channel Impedance Value: 840 Ohm
Lead Channel Pacing Threshold Amplitude: 0.6 V
Lead Channel Pacing Threshold Pulse Width: 0.4 ms
Lead Channel Setting Pacing Amplitude: 2 V
Lead Channel Setting Pacing Amplitude: 3.5 V
Lead Channel Setting Pacing Pulse Width: 1 ms
Lead Channel Setting Sensing Sensitivity: 1.5 mV
Pulse Gen Serial Number: 832937

## 2019-02-08 DIAGNOSIS — L97512 Non-pressure chronic ulcer of other part of right foot with fat layer exposed: Secondary | ICD-10-CM | POA: Diagnosis not present

## 2019-02-08 DIAGNOSIS — L603 Nail dystrophy: Secondary | ICD-10-CM | POA: Diagnosis not present

## 2019-02-08 DIAGNOSIS — E1142 Type 2 diabetes mellitus with diabetic polyneuropathy: Secondary | ICD-10-CM | POA: Diagnosis not present

## 2019-02-09 ENCOUNTER — Encounter: Payer: Self-pay | Admitting: Cardiology

## 2019-02-09 NOTE — Progress Notes (Signed)
Remote pacemaker transmission.   

## 2019-02-10 ENCOUNTER — Encounter (HOSPITAL_COMMUNITY): Payer: Self-pay | Admitting: Emergency Medicine

## 2019-02-10 ENCOUNTER — Other Ambulatory Visit: Payer: Self-pay

## 2019-02-10 ENCOUNTER — Emergency Department (HOSPITAL_COMMUNITY)
Admission: EM | Admit: 2019-02-10 | Discharge: 2019-02-10 | Disposition: A | Discharge status: Home / Self Care | Payer: Medicare Other | Attending: Emergency Medicine | Admitting: Emergency Medicine

## 2019-02-10 ENCOUNTER — Emergency Department (HOSPITAL_COMMUNITY): Payer: Medicare Other

## 2019-02-10 DIAGNOSIS — I132 Hypertensive heart and chronic kidney disease with heart failure and with stage 5 chronic kidney disease, or end stage renal disease: Secondary | ICD-10-CM | POA: Insufficient documentation

## 2019-02-10 DIAGNOSIS — F17210 Nicotine dependence, cigarettes, uncomplicated: Secondary | ICD-10-CM | POA: Insufficient documentation

## 2019-02-10 DIAGNOSIS — I5032 Chronic diastolic (congestive) heart failure: Secondary | ICD-10-CM | POA: Diagnosis not present

## 2019-02-10 DIAGNOSIS — Z950 Presence of cardiac pacemaker: Secondary | ICD-10-CM | POA: Insufficient documentation

## 2019-02-10 DIAGNOSIS — I2510 Atherosclerotic heart disease of native coronary artery without angina pectoris: Secondary | ICD-10-CM | POA: Insufficient documentation

## 2019-02-10 DIAGNOSIS — Z794 Long term (current) use of insulin: Secondary | ICD-10-CM | POA: Insufficient documentation

## 2019-02-10 DIAGNOSIS — E1122 Type 2 diabetes mellitus with diabetic chronic kidney disease: Secondary | ICD-10-CM | POA: Diagnosis not present

## 2019-02-10 DIAGNOSIS — J449 Chronic obstructive pulmonary disease, unspecified: Secondary | ICD-10-CM | POA: Diagnosis not present

## 2019-02-10 DIAGNOSIS — M79662 Pain in left lower leg: Secondary | ICD-10-CM | POA: Diagnosis not present

## 2019-02-10 DIAGNOSIS — M7989 Other specified soft tissue disorders: Secondary | ICD-10-CM | POA: Diagnosis not present

## 2019-02-10 DIAGNOSIS — Z79899 Other long term (current) drug therapy: Secondary | ICD-10-CM | POA: Diagnosis not present

## 2019-02-10 DIAGNOSIS — N185 Chronic kidney disease, stage 5: Secondary | ICD-10-CM | POA: Diagnosis not present

## 2019-02-10 NOTE — ED Provider Notes (Signed)
[facility] EMERGENCY DEPARTMENT Provider Note   CSN: [id number] Arrival date & time: 02/10/19  1414     History   Chief Complaint Chief Complaint  Patient presents with   Leg Pain    HPI [name] is a 68 y.o. male.     HPI   [name] is a 68 y.o. male with past medical history of coronary artery disease, atrial fibrillation, anemia hypertension and peripheral vascular disease.  His anticoagulant was discontinued in June by his cardiologist. He presents to the Emergency Department complaining of sudden onset of burning pain and swelling to his medial left lower leg.  States that he was standing in line at the store when the symptoms began.  He denies recent fall or excessive use.  He describes noticing a flesh-colored area of swelling immediately after the burning.  Area is painful to touch.  No difficulty walking or standing.  He denies numbness, redness, and history of DVT.   Past Medical History:  Diagnosis Date   AAA (abdominal aortic aneurysm) ([place]) 06/2016   Mild increase in size of 3.4 cm infrarenal abdominal aortic aneurysm   Anemia    Aortic atherosclerosis (HCC)    Asthma    Atrial flutter (HCC)    AVF (arteriovenous fistula) (HCC)    Left forearm   CAD (coronary artery disease)    a. s/p prior stenting of RCA in 1999 b. cath in 2003 showing moderate CAD c. NST in 2016 showing small area of ischemic and low-risk   CHF (congestive heart failure) ([place])    CKD (chronic kidney disease), stage V ([place])    kidney transplant evaluation   COPD (chronic obstructive pulmonary disease) ([place])    with ongoing tobacco use and patient failed sham takes   Diverticulosis 2018   Mild sigmoid colon diverticulosis.   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    Reflux esophagitis    hx   Sleep apnea    PT STATES ONE TEST WAS POSITIVE AND ANOTHER TEST WAS NEGATIVE   Two-vessel coronary artery disease    moderate. by cath in 2003. Status post stenting of the mdi RCA November 1999 normal left ventricular ejection fraction   Wears glasses    Wears hearing aid    B/L    Patient Active Problem List   Diagnosis Date Noted   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[place]) 03/18/2018   Chronic renal insufficiency, stage 3 (moderate) (HCC) 03/18/2018   Diffuse wheezing 12/02/2017   OSA and COPD overlap syndrome ([place]) 12/02/2017   Sleeps in sitting position due to orthopnea 12/02/2017   Pacemaker 12/02/2017   Bradycardia with 31-40 beats per minute 12/02/2017   Coronary artery disease due to calcified coronary lesion 12/02/2017   Insomnia due to medical condition 12/02/2017   Snoring 12/02/2017   Chronic diastolic CHF (congestive heart failure) ([place]) 05/10/2017   Atypical chest pain    Chest pain 05/09/2017   Atrial flutter ([place]) 05/09/2017  Acute renal failure superimposed on stage 4 chronic kidney disease ([place]) 05/09/2017   Hypokalemia 04/05/2016   Diabetes mellitus with nephropathy ([place]) 04/05/2016   Hyponatremia 04/05/2016   Hematuria 04/05/2016   Diarrhea 04/05/2016   Bilateral diabetic foot ulcer associated with secondary diabetes mellitus ([place]) 04/05/2016   Streptococcal bacteremia 04/05/2016   Cellulitis of leg, left 04/05/2016   Sepsis ([place]) 04/04/2016   CAD (coronary artery disease) 11/10/2015   Bilateral lower extremity edema 07/25/2015   Foot infection 09/27/2014   Diabetic foot infection ([place]) 09/27/2014   CKD stage 3 due to type 2 diabetes mellitus ([place])     Diabetes mellitus with renal manifestations, uncontrolled ([place])    Benign essential HTN    ED (erectile dysfunction) 06/09/2012   Hypertension 03/11/2011   OSTEOMYELITIS, ACUTE, ANKLE/FOOT 07/24/2010   EDEMA 02/15/2010   MORBID OBESITY 07/05/2009   TOBACCO ABUSE 07/05/2009   OBSTRUCTIVE SLEEP APNEA 07/05/2009   COPD (chronic obstructive pulmonary disease) ([place]) 07/05/2009   AODM 03/30/2008   HYPERLIPIDEMIA 03/30/2008   Generalized anxiety disorder 03/30/2008   BUERGER'S DISEASE 03/30/2008    Past Surgical History:  Procedure Laterality Date   ABDOMINAL SURGERY     APPENDECTOMY     AV FISTULA PLACEMENT Right 03/11/2018   Procedure: CREATION Brachiocephalic Fistula RIGHT ARM;  Surgeon: Rosetta Posner, MD;  Location: [place];  Service: Vascular;  Laterality: Right;   BACK SURGERY     x3   BIOPSY  11/12/2018   Procedure: BIOPSY;  Surgeon: Laurence Spates, MD;  Location: [facility];  Service: Endoscopy;;   CARDIAC CATHETERIZATION     stent   CHOLECYSTECTOMY     CLOSED REDUCTION SHOULDER DISLOCATION     COLONOSCOPY W/ BIOPSIES AND POLYPECTOMY     COLONOSCOPY WITH PROPOFOL N/A 11/12/2018   Procedure: COLONOSCOPY WITH PROPOFOL;  Surgeon: Laurence Spates, MD;  Location: [facility];  Service: Endoscopy;  Laterality: N/A;   diabetic ulcers     ESOPHAGOGASTRODUODENOSCOPY (EGD) WITH PROPOFOL N/A 11/12/2018   Procedure: ESOPHAGOGASTRODUODENOSCOPY (EGD) WITH PROPOFOL;  Surgeon: Laurence Spates, MD;  Location: [facility];  Service: Endoscopy;  Laterality: N/A;   GROIN EXPLORATION     HEMOSTASIS CLIP PLACEMENT  11/12/2018   Procedure: HEMOSTASIS CLIP PLACEMENT;  Surgeon: Laurence Spates, MD;  Location: [facility];  Service: Endoscopy;;   INSERTION OF ARTERIOVENOUS (AV) ARTEGRAFT ARM Left 03/18/2018   Procedure: INSERTION OF ARTERIOVENOUS (AV) FISTULA;  Surgeon: Rosetta Posner, MD;  Location: [place];  Service: Vascular;  Laterality: Left;   LIGATION ARTERIOVENOUS GORTEX  GRAFT Right 03/18/2018   Procedure: LIGATION ARTERIOVENOUS FISTULA;  Surgeon: Rosetta Posner, MD;  Location: [place];  Service: Vascular;  Laterality: Right;   OSTECTOMY Left 04/23/2017   Procedure: OSTECTOMY LEFT GREAT TOE;  Surgeon: [name], DPM;  Location: [facility];  Service: Podiatry;  Laterality: Left;  left great toe   POLYPECTOMY  11/12/2018   Procedure: POLYPECTOMY;  Surgeon: Laurence Spates, MD;  Location: [facility];  Service: Endoscopy;;   TUMOR REMOVAL     small intestine x3      Home Medications    Prior to Admission medications   Medication Sig Start Date End Date Taking? Authorizing Provider  acetaminophen (TYLENOL) 325 MG tablet Take 975 mg by mouth daily.     [provider]  albuterol (VENTOLIN HFA) 108 (90 BASE) MCG/ACT inhaler Inhale 2 puffs into the lungs every 6 (six) hours as needed for wheezing or shortness of breath.     [provider]  atorvastatin (LIPITOR) 40 MG tablet Take 1 tablet (40 mg total) by mouth every evening. 08/24/18   Arnoldo Lenis, MD  carvedilol (COREG) 25 MG tablet TAKE ONE TABLET BY MOUTH TWICE DAILY. Patient taking differently: Take 25 mg by mouth 2 (two) times a day.  08/13/18   Arnoldo Lenis, MD  Cholecalciferol (VITAMIN D3) 2000 units TABS Take 2,000 Units by mouth daily.     [provider]  diltiazem (CARDIZEM) 30 MG tablet TAKE ONE TABLET BY MOUTH TWICE DAILY. 11/11/18   Arnoldo Lenis, MD  epoetin alfa (EPOGEN) 4000 UNIT/ML injection Inject 4,000 Units into the vein every 14 (fourteen) days. 09/08/18   Fran Lowes, MD  hydrALAZINE (APRESOLINE) 100 MG tablet TAKE ONE TABLET BY MOUTH THREE TIMES DAILY Patient taking differently: Take 100 mg by mouth 3 (three) times daily.  08/13/18   Arnoldo Lenis, MD  insulin glargine (LANTUS) 100 UNIT/ML injection Inject 35 Units into the skin daily.     [provider]  LINZESS 290 MCG CAPS capsule Take 290 mcg by mouth daily as needed (for  constipation).  07/26/15   [provider]  Melatonin 10 MG TABS Take 20 mg by mouth at bedtime.     [provider]  Multiple Vitamin (MULTIVITAMIN WITH MINERALS) TABS tablet Take 1 tablet by mouth daily.    [provider]  omeprazole (PRILOSEC) 20 MG capsule Take 20 mg daily by mouth.    [provider]  oxyCODONE-acetaminophen (PERCOCET) 5-325 MG tablet Take 1 tablet by mouth every 6 (six) hours as needed for severe pain. 03/19/18   Rhyne, Hulen Shouts, PA-C  polyethylene glycol (MIRALAX / GLYCOLAX) packet Take 17 g by mouth daily. With morning coffee    [provider]  potassium chloride SA (K-DUR,KLOR-CON) 20 MEQ tablet Take 1 tablet (20 mEq total) by mouth daily. 05/10/17   Orson Eva, MD  QVAR REDIHALER 80 MCG/ACT inhaler Inhale 2 puffs into the lungs 2 (two) times daily as needed (respiratory issues.).  10/06/17   [provider]  rosuvastatin (CRESTOR) 20 MG tablet Take 20 mg by mouth daily.    [provider]  sildenafil (VIAGRA) 100 MG tablet Take 1 tablet (100 mg total) by mouth as needed for erectile dysfunction. Patient taking differently: Take 100 mg by mouth daily as needed for erectile dysfunction.  01/23/18   Allred, Jeneen Rinks, MD  silver sulfADIAZINE (SILVADENE) 1 % cream Apply 1 application topically daily as needed (For wound care with dressing).     [provider]  torsemide (DEMADEX) 100 MG tablet Take 50 mg by mouth 2 (two) times daily. Changed by Dr. Lowanda Foster this March. 10/06/17   [provider]    Family History Family History  Problem Relation Age of Onset   Diabetes Mother    Alcohol abuse Father     Social History Social History   Tobacco Use   Smoking status: Current Every Day Smoker    Packs/day: 1.00    Years: 40.00    Pack years: 40.00    Types: Cigarettes    Start date: 05/20/1968   Smokeless tobacco: Never Used  Substance Use Topics   Alcohol use: Not Currently     Alcohol/week: 0.0 standard drinks    Comment: rare   Drug use: No     Allergies   Patient has no known allergies.   Review of Systems Review of Systems  Constitutional: Negative for chills and fever.  Respiratory: Negative for  chest tightness and shortness of breath.   Cardiovascular: Negative for chest pain.  Gastrointestinal: Negative for nausea and vomiting.  Genitourinary: Negative for difficulty urinating and dysuria.  Musculoskeletal: Positive for myalgias (left lower leg pain and swelling). Negative for arthralgias and joint swelling.  Skin: Negative for color change and wound.  Neurological: Negative for weakness and numbness.     Physical Exam Updated Vital Signs BP (!) 167/70 (BP Location: Right Arm)    Pulse 60    Temp 98.2 F (36.8 C) (Oral)    Resp 18    SpO2 98%   Physical Exam Vitals signs and nursing note reviewed.  Constitutional:      Appearance: Normal appearance. He is not ill-appearing or toxic-appearing.  HENT:     Head: Atraumatic.     Mouth/Throat:     Mouth: Mucous membranes are moist.  Cardiovascular:     Rate and Rhythm: Normal rate and regular rhythm.     Pulses: Normal pulses.  Pulmonary:     Effort: Pulmonary effort is normal.     Breath sounds: Normal breath sounds.  Musculoskeletal:        General: Swelling and tenderness present.     Comments: 6 cm localized, flesh colored, area of swelling to the medial aspect of the left lower leg.  Pt does have some varicosities of the bilateral LE's.  No erythema of excessive warmth.  No cord or posterior calf pain  Skin:    General: Skin is warm.     Capillary Refill: Capillary refill takes less than 2 seconds.  Neurological:     General: No focal deficit present.      ED Treatments / Results  Labs (all labs ordered are listed, but only abnormal results are displayed) Labs Reviewed - No data to display  EKG None  Radiology US Venous Img Lower Unilateral Left  Result Date:  02/10/2019 CLINICAL DATA:  68 year old male with a history of left leg swelling EXAM: LEFT LOWER EXTREMITY VENOUS DOPPLER ULTRASOUND TECHNIQUE: Gray-scale sonography with graded compression, as well as color Doppler and duplex ultrasound were performed to evaluate the lower extremity deep venous systems from the level of the common femoral vein and including the common femoral, femoral, profunda femoral, popliteal and calf veins including the posterior tibial, peroneal and gastrocnemius veins when visible. The superficial great saphenous vein was also interrogated. Spectral Doppler was utilized to evaluate flow at rest and with distal augmentation maneuvers in the common femoral, femoral and popliteal veins. COMPARISON:  None. FINDINGS: Contralateral Common Femoral Vein: Respiratory phasicity is normal and symmetric with the symptomatic side. No evidence of thrombus. Normal compressibility. Common Femoral Vein: No evidence of thrombus. Normal compressibility, respiratory phasicity and response to augmentation. Saphenofemoral Junction: No evidence of thrombus. Normal compressibility and flow on color Doppler imaging. Profunda Femoral Vein: No evidence of thrombus. Normal compressibility and flow on color Doppler imaging. Femoral Vein: No evidence of thrombus. Normal compressibility, respiratory phasicity and response to augmentation. Popliteal Vein: No evidence of thrombus. Normal compressibility, respiratory phasicity and response to augmentation. Calf Veins: No evidence of thrombus. Normal compressibility and flow on color Doppler imaging. Superficial Great Saphenous Vein: No evidence of thrombus. Normal compressibility and flow on color Doppler imaging. Other Findings:  None. IMPRESSION: Sonographic survey of the left lower extremity negative for DVT Electronically Signed   By: Corrie Mckusick D.O.   On: 02/10/2019 16:08     Procedures Procedures (including critical care time)  Medications Ordered in  ED  Medications - No data to display   Initial Impression / Assessment and Plan / ED Course  I have reviewed the triage vital signs and the nursing notes.  Pertinent labs & imaging results that were available during my care of the patient were reviewed by me and considered in my medical decision making (see chart for details).        Pt with focal tenderness and edema of the LLE.  Venous US is negative for DVT.  I feel the area is likely related to a varicosity.  He agrees to compression, elevation and close out patient f/u.  Final Clinical Impressions(s) / ED Diagnoses   Final diagnoses:  Pain in left lower leg    ED Discharge Orders    None       Kem Parkinson, Hershal Coria 02/14/19 1112    Virgel Manifold, MD 02/22/19 [phone]

## 2019-02-10 NOTE — Discharge Instructions (Addendum)
Elevate your leg when possible.  Wear the ACE wrap for support to the area, you may remove for bathing.  Follow-up with your doctor for recheck or return here if needed.

## 2019-02-10 NOTE — ED Triage Notes (Signed)
Pt states that he was standing in line and his left lower leg started to burn and a knot come on the inside of his leg. He denies injury

## 2019-02-15 ENCOUNTER — Other Ambulatory Visit: Payer: Self-pay | Admitting: Cardiology

## 2019-02-16 ENCOUNTER — Ambulatory Visit (HOSPITAL_COMMUNITY): Payer: Medicare Other

## 2019-02-16 ENCOUNTER — Other Ambulatory Visit (HOSPITAL_COMMUNITY): Payer: Medicare Other

## 2019-02-22 DIAGNOSIS — E1142 Type 2 diabetes mellitus with diabetic polyneuropathy: Secondary | ICD-10-CM | POA: Diagnosis not present

## 2019-02-22 DIAGNOSIS — L97512 Non-pressure chronic ulcer of other part of right foot with fat layer exposed: Secondary | ICD-10-CM | POA: Diagnosis not present

## 2019-02-23 ENCOUNTER — Encounter (HOSPITAL_COMMUNITY)
Admission: RE | Admit: 2019-02-23 | Discharge: 2019-02-23 | Disposition: A | Discharge status: Home / Self Care | Payer: Medicare Other | Source: Ambulatory Visit | Attending: Nephrology | Admitting: Nephrology

## 2019-02-23 ENCOUNTER — Encounter (HOSPITAL_COMMUNITY): Payer: Self-pay

## 2019-02-23 ENCOUNTER — Other Ambulatory Visit: Payer: Self-pay

## 2019-02-23 DIAGNOSIS — D509 Iron deficiency anemia, unspecified: Secondary | ICD-10-CM | POA: Insufficient documentation

## 2019-02-23 DIAGNOSIS — E559 Vitamin D deficiency, unspecified: Secondary | ICD-10-CM | POA: Diagnosis not present

## 2019-02-23 DIAGNOSIS — R809 Proteinuria, unspecified: Secondary | ICD-10-CM | POA: Insufficient documentation

## 2019-02-23 DIAGNOSIS — N184 Chronic kidney disease, stage 4 (severe): Secondary | ICD-10-CM | POA: Insufficient documentation

## 2019-02-23 DIAGNOSIS — I129 Hypertensive chronic kidney disease with stage 1 through stage 4 chronic kidney disease, or unspecified chronic kidney disease: Secondary | ICD-10-CM | POA: Diagnosis not present

## 2019-02-23 DIAGNOSIS — D631 Anemia in chronic kidney disease: Secondary | ICD-10-CM | POA: Insufficient documentation

## 2019-02-23 LAB — RENAL FUNCTION PANEL
Albumin: 3.4 g/dL — ABNORMAL LOW (ref 3.5–5.0)
Anion gap: 10 (ref 5–15)
BUN: 70 mg/dL — ABNORMAL HIGH (ref 8–23)
CO2: 26 mmol/L (ref 22–32)
Calcium: 8.6 mg/dL — ABNORMAL LOW (ref 8.9–10.3)
Chloride: 103 mmol/L (ref 98–111)
Creatinine, Ser: 5.31 mg/dL — ABNORMAL HIGH (ref 0.61–1.24)
GFR calc Af Amer: 12 mL/min — ABNORMAL LOW (ref >60)
GFR calc non Af Amer: 10 mL/min — ABNORMAL LOW (ref >60)
Glucose, Bld: 102 mg/dL — ABNORMAL HIGH (ref 70–99)
Phosphorus: 4.2 mg/dL (ref 2.5–4.6)
Potassium: 4.4 mmol/L (ref 3.5–5.1)
Sodium: 139 mmol/L (ref 135–145)

## 2019-02-23 LAB — PROTEIN / CREATININE RATIO, URINE
Creatinine, Urine: 49.03 mg/dL
Protein Creatinine Ratio: 3.57 mg/mg{Cre} — ABNORMAL HIGH (ref 0.00–0.15)
Total Protein, Urine: 175 mg/dL

## 2019-02-23 LAB — IRON AND TIBC
Iron: 39 ug/dL — ABNORMAL LOW (ref 45–182)
Saturation Ratios: 13 % — ABNORMAL LOW (ref 17.9–39.5)
TIBC: 297 ug/dL (ref 250–450)
UIBC: 258 ug/dL

## 2019-02-23 LAB — VITAMIN D 25 HYDROXY (VIT D DEFICIENCY, FRACTURES): Vit D, 25-Hydroxy: 41.89 ng/mL (ref 30–100)

## 2019-02-23 LAB — HEMOGLOBIN AND HEMATOCRIT, BLOOD
HCT: 34.8 % — ABNORMAL LOW (ref 39.0–52.0)
Hemoglobin: 11 g/dL — ABNORMAL LOW (ref 13.0–17.0)

## 2019-02-23 LAB — FERRITIN: Ferritin: 55 ng/mL (ref 24–336)

## 2019-02-23 NOTE — Progress Notes (Signed)
Results for [name], [name] (MRN [id number]) as of 02/23/2019 14:35  Pending Vit D deficiency and IPTH    Ref. Range 02/23/2019 11:13 02/23/2019 12:10  Sodium Latest Ref Range: 135 - 145 mmol/L 139   Potassium Latest Ref Range: 3.5 - 5.1 mmol/L 4.4   Chloride Latest Ref Range: 98 - 111 mmol/L 103   CO2 Latest Ref Range: 22 - 32 mmol/L 26   Glucose Latest Ref Range: 70 - 99 mg/dL 102 (H)   BUN Latest Ref Range: 8 - 23 mg/dL 70 (H)   Creatinine Latest Ref Range: 0.61 - 1.24 mg/dL 5.31 (H)   Calcium Latest Ref Range: 8.9 - 10.3 mg/dL 8.6 (L)   Anion gap Latest Ref Range: 5 - 15  10   Phosphorus Latest Ref Range: 2.5 - 4.6 mg/dL 4.2   Albumin Latest Ref Range: 3.5 - 5.0 g/dL 3.4 (L)   GFR, Est Non African American Latest Ref Range: >60 mL/min 10 (L)   GFR, Est African American Latest Ref Range: >60 mL/min 12 (L)   Iron Latest Ref Range: 45 - 182 ug/dL  39 (L)  UIBC Latest Units: ug/dL  258  TIBC Latest Ref Range: 250 - 450 ug/dL  297  Saturation Ratios Latest Ref Range: 17.9 - 39.5 %  13 (L)  Ferritin Latest Ref Range: 24 - 336 ng/mL  55  Hemoglobin Latest Ref Range: 13.0 - 17.0 g/dL 11.0 (L)   HCT Latest Ref Range: 39.0 - 52.0 % 34.8 (L)   Total Protein, Urine Latest Units: mg/dL 175   Protein Creatinine Ratio Latest Ref Range: 0.00 - 0.15 mg/mgCre 3.57 (H)   Creatinine, Urine Latest Units: mg/dL 49.03

## 2019-02-24 LAB — PTH, INTACT AND CALCIUM
Calcium, Total (PTH): 9 mg/dL (ref 8.6–10.2)
PTH: 139 pg/mL — ABNORMAL HIGH (ref 15–65)

## 2019-03-02 DIAGNOSIS — Z85828 Personal history of other malignant neoplasm of skin: Secondary | ICD-10-CM | POA: Diagnosis not present

## 2019-03-02 DIAGNOSIS — B078 Other viral warts: Secondary | ICD-10-CM | POA: Diagnosis not present

## 2019-03-02 DIAGNOSIS — L820 Inflamed seborrheic keratosis: Secondary | ICD-10-CM | POA: Diagnosis not present

## 2019-03-02 DIAGNOSIS — Z08 Encounter for follow-up examination after completed treatment for malignant neoplasm: Secondary | ICD-10-CM | POA: Diagnosis not present

## 2019-03-04 DIAGNOSIS — I120 Hypertensive chronic kidney disease with stage 5 chronic kidney disease or end stage renal disease: Secondary | ICD-10-CM | POA: Diagnosis not present

## 2019-03-04 DIAGNOSIS — D631 Anemia in chronic kidney disease: Secondary | ICD-10-CM | POA: Diagnosis not present

## 2019-03-04 DIAGNOSIS — R809 Proteinuria, unspecified: Secondary | ICD-10-CM | POA: Diagnosis not present

## 2019-03-04 DIAGNOSIS — N189 Chronic kidney disease, unspecified: Secondary | ICD-10-CM | POA: Diagnosis not present

## 2019-03-04 DIAGNOSIS — I770 Arteriovenous fistula, acquired: Secondary | ICD-10-CM | POA: Diagnosis not present

## 2019-03-04 DIAGNOSIS — E559 Vitamin D deficiency, unspecified: Secondary | ICD-10-CM | POA: Diagnosis not present

## 2019-03-04 DIAGNOSIS — E211 Secondary hyperparathyroidism, not elsewhere classified: Secondary | ICD-10-CM | POA: Diagnosis not present

## 2019-03-04 DIAGNOSIS — N185 Chronic kidney disease, stage 5: Secondary | ICD-10-CM | POA: Diagnosis not present

## 2019-03-05 DIAGNOSIS — Z23 Encounter for immunization: Secondary | ICD-10-CM | POA: Diagnosis not present

## 2019-03-08 DIAGNOSIS — H25813 Combined forms of age-related cataract, bilateral: Secondary | ICD-10-CM | POA: Diagnosis not present

## 2019-03-08 DIAGNOSIS — H35033 Hypertensive retinopathy, bilateral: Secondary | ICD-10-CM | POA: Diagnosis not present

## 2019-03-08 DIAGNOSIS — E119 Type 2 diabetes mellitus without complications: Secondary | ICD-10-CM | POA: Diagnosis not present

## 2019-03-09 ENCOUNTER — Encounter (HOSPITAL_COMMUNITY)
Admission: RE | Admit: 2019-03-09 | Discharge: 2019-03-09 | Disposition: A | Discharge status: Home / Self Care | Payer: Medicare Other | Source: Ambulatory Visit | Attending: Nephrology | Admitting: Nephrology

## 2019-03-09 ENCOUNTER — Other Ambulatory Visit: Payer: Self-pay

## 2019-03-09 DIAGNOSIS — E559 Vitamin D deficiency, unspecified: Secondary | ICD-10-CM | POA: Diagnosis not present

## 2019-03-09 DIAGNOSIS — D631 Anemia in chronic kidney disease: Secondary | ICD-10-CM | POA: Diagnosis not present

## 2019-03-09 DIAGNOSIS — I129 Hypertensive chronic kidney disease with stage 1 through stage 4 chronic kidney disease, or unspecified chronic kidney disease: Secondary | ICD-10-CM | POA: Diagnosis not present

## 2019-03-09 DIAGNOSIS — N184 Chronic kidney disease, stage 4 (severe): Secondary | ICD-10-CM | POA: Diagnosis not present

## 2019-03-09 DIAGNOSIS — D509 Iron deficiency anemia, unspecified: Secondary | ICD-10-CM | POA: Diagnosis not present

## 2019-03-09 DIAGNOSIS — R809 Proteinuria, unspecified: Secondary | ICD-10-CM | POA: Diagnosis not present

## 2019-03-09 LAB — POCT HEMOGLOBIN-HEMACUE: Hemoglobin: 11.1 g/dL — ABNORMAL LOW (ref 13.0–17.0)

## 2019-03-15 DIAGNOSIS — L97512 Non-pressure chronic ulcer of other part of right foot with fat layer exposed: Secondary | ICD-10-CM | POA: Diagnosis not present

## 2019-03-15 DIAGNOSIS — E1142 Type 2 diabetes mellitus with diabetic polyneuropathy: Secondary | ICD-10-CM | POA: Diagnosis not present

## 2019-03-23 ENCOUNTER — Encounter (HOSPITAL_COMMUNITY)
Admission: RE | Admit: 2019-03-23 | Discharge: 2019-03-23 | Disposition: A | Discharge status: Home / Self Care | Payer: Medicare Other | Source: Ambulatory Visit | Attending: Nephrology | Admitting: Nephrology

## 2019-03-23 ENCOUNTER — Encounter (HOSPITAL_COMMUNITY): Payer: Self-pay

## 2019-03-23 ENCOUNTER — Encounter (HOSPITAL_COMMUNITY): Payer: Medicare Other

## 2019-03-23 ENCOUNTER — Other Ambulatory Visit: Payer: Self-pay

## 2019-03-23 DIAGNOSIS — D631 Anemia in chronic kidney disease: Secondary | ICD-10-CM | POA: Insufficient documentation

## 2019-03-23 DIAGNOSIS — N184 Chronic kidney disease, stage 4 (severe): Secondary | ICD-10-CM | POA: Insufficient documentation

## 2019-03-23 MED ORDER — EPOETIN ALFA 4000 UNIT/ML IJ SOLN: 4000.0000 [IU] | Freq: Once | INTRAMUSCULAR | Status: DC

## 2019-03-24 LAB — POCT HEMOGLOBIN-HEMACUE: Hemoglobin: 11.2 g/dL — ABNORMAL LOW (ref 13.0–17.0)

## 2019-03-31 ENCOUNTER — Telehealth: Payer: Self-pay | Admitting: Cardiology

## 2019-03-31 NOTE — Telephone Encounter (Signed)
[name] from [facility] requesting reports and images on disk of LE US/stress test/most recent cath - will send reports via Epic and forward to medical records to send by disk via mail - she will fax [place] a form with mailing and fax number to send reports to

## 2019-03-31 NOTE — Telephone Encounter (Signed)
New Message     [name] is calling and is needing Images uploaded to a disk of the pts passed procedures    Please call

## 2019-04-02 DIAGNOSIS — D500 Iron deficiency anemia secondary to blood loss (chronic): Secondary | ICD-10-CM | POA: Diagnosis not present

## 2019-04-02 DIAGNOSIS — K5520 Angiodysplasia of colon without hemorrhage: Secondary | ICD-10-CM | POA: Diagnosis not present

## 2019-04-02 DIAGNOSIS — E1140 Type 2 diabetes mellitus with diabetic neuropathy, unspecified: Secondary | ICD-10-CM | POA: Diagnosis not present

## 2019-04-02 DIAGNOSIS — K219 Gastro-esophageal reflux disease without esophagitis: Secondary | ICD-10-CM | POA: Diagnosis not present

## 2019-04-02 DIAGNOSIS — Z950 Presence of cardiac pacemaker: Secondary | ICD-10-CM | POA: Diagnosis not present

## 2019-04-02 DIAGNOSIS — Z86010 Personal history of colonic polyps: Secondary | ICD-10-CM | POA: Diagnosis not present

## 2019-04-02 DIAGNOSIS — D3A00 Benign carcinoid tumor of unspecified site: Secondary | ICD-10-CM | POA: Diagnosis not present

## 2019-04-02 DIAGNOSIS — N184 Chronic kidney disease, stage 4 (severe): Secondary | ICD-10-CM | POA: Diagnosis not present

## 2019-04-05 DIAGNOSIS — L97511 Non-pressure chronic ulcer of other part of right foot limited to breakdown of skin: Secondary | ICD-10-CM | POA: Diagnosis not present

## 2019-04-05 DIAGNOSIS — E1142 Type 2 diabetes mellitus with diabetic polyneuropathy: Secondary | ICD-10-CM | POA: Diagnosis not present

## 2019-04-06 ENCOUNTER — Other Ambulatory Visit (HOSPITAL_COMMUNITY): Payer: Medicare Other

## 2019-04-07 ENCOUNTER — Other Ambulatory Visit: Payer: Self-pay

## 2019-04-07 ENCOUNTER — Encounter (HOSPITAL_COMMUNITY)
Admission: RE | Admit: 2019-04-07 | Discharge: 2019-04-07 | Disposition: A | Discharge status: Home / Self Care | Payer: Medicare Other | Source: Ambulatory Visit | Attending: Nephrology | Admitting: Nephrology

## 2019-04-07 ENCOUNTER — Encounter (HOSPITAL_COMMUNITY): Payer: Self-pay

## 2019-04-07 ENCOUNTER — Ambulatory Visit (HOSPITAL_COMMUNITY): Payer: Medicare Other

## 2019-04-07 DIAGNOSIS — I5032 Chronic diastolic (congestive) heart failure: Secondary | ICD-10-CM | POA: Diagnosis not present

## 2019-04-07 DIAGNOSIS — R0600 Dyspnea, unspecified: Secondary | ICD-10-CM | POA: Diagnosis not present

## 2019-04-07 DIAGNOSIS — R0602 Shortness of breath: Secondary | ICD-10-CM | POA: Diagnosis not present

## 2019-04-07 DIAGNOSIS — I132 Hypertensive heart and chronic kidney disease with heart failure and with stage 5 chronic kidney disease, or end stage renal disease: Secondary | ICD-10-CM | POA: Diagnosis not present

## 2019-04-07 DIAGNOSIS — N186 End stage renal disease: Secondary | ICD-10-CM | POA: Diagnosis not present

## 2019-04-07 DIAGNOSIS — N049 Nephrotic syndrome with unspecified morphologic changes: Secondary | ICD-10-CM | POA: Diagnosis not present

## 2019-04-07 DIAGNOSIS — I4892 Unspecified atrial flutter: Secondary | ICD-10-CM | POA: Diagnosis not present

## 2019-04-07 DIAGNOSIS — Z66 Do not resuscitate: Secondary | ICD-10-CM | POA: Diagnosis not present

## 2019-04-07 DIAGNOSIS — Z20828 Contact with and (suspected) exposure to other viral communicable diseases: Secondary | ICD-10-CM | POA: Diagnosis not present

## 2019-04-07 LAB — POCT HEMOGLOBIN-HEMACUE: Hemoglobin: 10.3 g/dL — ABNORMAL LOW (ref 13.0–17.0)

## 2019-04-07 MED ORDER — EPOETIN ALFA 4000 UNIT/ML IJ SOLN: 4000.0000 [IU] | Freq: Once | INTRAMUSCULAR | Status: DC

## 2019-04-09 ENCOUNTER — Encounter (HOSPITAL_COMMUNITY)
Admission: RE | Admit: 2019-04-09 | Discharge: 2019-04-09 | Disposition: A | Discharge status: Home / Self Care | Payer: Medicare Other | Source: Ambulatory Visit | Attending: Nephrology | Admitting: Nephrology

## 2019-04-09 ENCOUNTER — Other Ambulatory Visit: Payer: Self-pay

## 2019-04-09 DIAGNOSIS — I770 Arteriovenous fistula, acquired: Secondary | ICD-10-CM | POA: Diagnosis not present

## 2019-04-09 DIAGNOSIS — N185 Chronic kidney disease, stage 5: Secondary | ICD-10-CM | POA: Diagnosis not present

## 2019-04-09 DIAGNOSIS — N189 Chronic kidney disease, unspecified: Secondary | ICD-10-CM | POA: Diagnosis not present

## 2019-04-09 DIAGNOSIS — E211 Secondary hyperparathyroidism, not elsewhere classified: Secondary | ICD-10-CM | POA: Diagnosis not present

## 2019-04-09 DIAGNOSIS — R600 Localized edema: Secondary | ICD-10-CM | POA: Diagnosis not present

## 2019-04-09 DIAGNOSIS — R809 Proteinuria, unspecified: Secondary | ICD-10-CM | POA: Diagnosis not present

## 2019-04-09 DIAGNOSIS — D631 Anemia in chronic kidney disease: Secondary | ICD-10-CM | POA: Diagnosis not present

## 2019-04-09 DIAGNOSIS — I120 Hypertensive chronic kidney disease with stage 5 chronic kidney disease or end stage renal disease: Secondary | ICD-10-CM | POA: Diagnosis not present

## 2019-04-09 LAB — POCT I-STAT, CHEM 8
BUN: 60 mg/dL — ABNORMAL HIGH (ref 8–23)
Calcium, Ion: 0.94 mmol/L — ABNORMAL LOW (ref 1.15–1.40)
Chloride: 107 mmol/L (ref 98–111)
Creatinine, Ser: 6.3 mg/dL — ABNORMAL HIGH (ref 0.61–1.24)
Glucose, Bld: 97 mg/dL (ref 70–99)
HCT: 32 % — ABNORMAL LOW (ref 39.0–52.0)
Hemoglobin: 10.9 g/dL — ABNORMAL LOW (ref 13.0–17.0)
Potassium: 3.7 mmol/L (ref 3.5–5.1)
Sodium: 140 mmol/L (ref 135–145)
TCO2: 21 mmol/L — ABNORMAL LOW (ref 22–32)

## 2019-04-09 LAB — IRON AND TIBC
Iron: 44 ug/dL — ABNORMAL LOW (ref 45–182)
Saturation Ratios: 13 % — ABNORMAL LOW (ref 17.9–39.5)
TIBC: 329 ug/dL (ref 250–450)
UIBC: 285 ug/dL

## 2019-04-09 LAB — CBC
HCT: 31.9 % — ABNORMAL LOW (ref 39.0–52.0)
Hemoglobin: 10.1 g/dL — ABNORMAL LOW (ref 13.0–17.0)
MCH: 33.3 pg (ref 26.0–34.0)
MCHC: 31.7 g/dL (ref 30.0–36.0)
MCV: 105.3 fL — ABNORMAL HIGH (ref 80.0–100.0)
Platelets: 177 10*3/uL (ref 150–400)
RBC: 3.03 MIL/uL — ABNORMAL LOW (ref 4.22–5.81)
RDW: 16 % — ABNORMAL HIGH (ref 11.5–15.5)
WBC: 6.7 10*3/uL (ref 4.0–10.5)
nRBC: 0 % (ref 0.0–0.2)

## 2019-04-09 LAB — PROTEIN / CREATININE RATIO, URINE
Creatinine, Urine: 51.56 mg/dL
Protein Creatinine Ratio: 5.33 mg/mg{Cre} — ABNORMAL HIGH (ref 0.00–0.15)
Total Protein, Urine: 275 mg/dL

## 2019-04-09 LAB — FERRITIN: Ferritin: 53 ng/mL (ref 24–336)

## 2019-04-09 LAB — VITAMIN D 25 HYDROXY (VIT D DEFICIENCY, FRACTURES): Vit D, 25-Hydroxy: 34.7 ng/mL (ref 30–100)

## 2019-04-10 ENCOUNTER — Other Ambulatory Visit: Payer: Self-pay

## 2019-04-10 ENCOUNTER — Inpatient Hospital Stay (HOSPITAL_COMMUNITY)
Admission: EM | Admit: 2019-04-10 | Discharge: 2019-04-13 | DRG: 291 | Disposition: A | Discharge status: Home / Self Care | Payer: Medicare Other | Attending: Internal Medicine | Admitting: Internal Medicine

## 2019-04-10 ENCOUNTER — Emergency Department (HOSPITAL_COMMUNITY): Payer: Medicare Other

## 2019-04-10 ENCOUNTER — Encounter (HOSPITAL_COMMUNITY): Payer: Self-pay | Admitting: Emergency Medicine

## 2019-04-10 DIAGNOSIS — D631 Anemia in chronic kidney disease: Secondary | ICD-10-CM | POA: Diagnosis present

## 2019-04-10 DIAGNOSIS — Z950 Presence of cardiac pacemaker: Secondary | ICD-10-CM

## 2019-04-10 DIAGNOSIS — I5032 Chronic diastolic (congestive) heart failure: Secondary | ICD-10-CM | POA: Diagnosis present

## 2019-04-10 DIAGNOSIS — Z66 Do not resuscitate: Secondary | ICD-10-CM | POA: Diagnosis present

## 2019-04-10 DIAGNOSIS — F17210 Nicotine dependence, cigarettes, uncomplicated: Secondary | ICD-10-CM | POA: Diagnosis present

## 2019-04-10 DIAGNOSIS — J449 Chronic obstructive pulmonary disease, unspecified: Secondary | ICD-10-CM | POA: Diagnosis present

## 2019-04-10 DIAGNOSIS — I120 Hypertensive chronic kidney disease with stage 5 chronic kidney disease or end stage renal disease: Secondary | ICD-10-CM | POA: Diagnosis not present

## 2019-04-10 DIAGNOSIS — I480 Paroxysmal atrial fibrillation: Secondary | ICD-10-CM | POA: Diagnosis present

## 2019-04-10 DIAGNOSIS — Z79899 Other long term (current) drug therapy: Secondary | ICD-10-CM

## 2019-04-10 DIAGNOSIS — Z20828 Contact with and (suspected) exposure to other viral communicable diseases: Secondary | ICD-10-CM | POA: Diagnosis present

## 2019-04-10 DIAGNOSIS — Z833 Family history of diabetes mellitus: Secondary | ICD-10-CM

## 2019-04-10 DIAGNOSIS — I714 Abdominal aortic aneurysm, without rupture: Secondary | ICD-10-CM | POA: Diagnosis present

## 2019-04-10 DIAGNOSIS — E1122 Type 2 diabetes mellitus with diabetic chronic kidney disease: Secondary | ICD-10-CM | POA: Diagnosis present

## 2019-04-10 DIAGNOSIS — K2100 Gastro-esophageal reflux disease with esophagitis, without bleeding: Secondary | ICD-10-CM | POA: Diagnosis present

## 2019-04-10 DIAGNOSIS — I5082 Biventricular heart failure: Secondary | ICD-10-CM | POA: Diagnosis present

## 2019-04-10 DIAGNOSIS — R0602 Shortness of breath: Secondary | ICD-10-CM | POA: Diagnosis not present

## 2019-04-10 DIAGNOSIS — K760 Fatty (change of) liver, not elsewhere classified: Secondary | ICD-10-CM | POA: Diagnosis present

## 2019-04-10 DIAGNOSIS — I2510 Atherosclerotic heart disease of native coronary artery without angina pectoris: Secondary | ICD-10-CM | POA: Diagnosis present

## 2019-04-10 DIAGNOSIS — I495 Sick sinus syndrome: Secondary | ICD-10-CM | POA: Diagnosis present

## 2019-04-10 DIAGNOSIS — Z974 Presence of external hearing-aid: Secondary | ICD-10-CM

## 2019-04-10 DIAGNOSIS — I700 Atherosclerosis of aorta: Secondary | ICD-10-CM | POA: Diagnosis present

## 2019-04-10 DIAGNOSIS — I132 Hypertensive heart and chronic kidney disease with heart failure and with stage 5 chronic kidney disease, or end stage renal disease: Principal | ICD-10-CM | POA: Diagnosis present

## 2019-04-10 DIAGNOSIS — R0600 Dyspnea, unspecified: Secondary | ICD-10-CM | POA: Diagnosis not present

## 2019-04-10 DIAGNOSIS — E8889 Other specified metabolic disorders: Secondary | ICD-10-CM | POA: Diagnosis present

## 2019-04-10 DIAGNOSIS — N186 End stage renal disease: Secondary | ICD-10-CM | POA: Diagnosis present

## 2019-04-10 DIAGNOSIS — Z992 Dependence on renal dialysis: Secondary | ICD-10-CM | POA: Diagnosis not present

## 2019-04-10 DIAGNOSIS — E1151 Type 2 diabetes mellitus with diabetic peripheral angiopathy without gangrene: Secondary | ICD-10-CM | POA: Diagnosis present

## 2019-04-10 DIAGNOSIS — G4730 Sleep apnea, unspecified: Secondary | ICD-10-CM | POA: Diagnosis present

## 2019-04-10 DIAGNOSIS — N179 Acute kidney failure, unspecified: Secondary | ICD-10-CM | POA: Diagnosis not present

## 2019-04-10 DIAGNOSIS — Z794 Long term (current) use of insulin: Secondary | ICD-10-CM

## 2019-04-10 DIAGNOSIS — E8770 Fluid overload, unspecified: Secondary | ICD-10-CM | POA: Diagnosis present

## 2019-04-10 DIAGNOSIS — Z4901 Encounter for fitting and adjustment of extracorporeal dialysis catheter: Secondary | ICD-10-CM | POA: Diagnosis not present

## 2019-04-10 DIAGNOSIS — K219 Gastro-esophageal reflux disease without esophagitis: Secondary | ICD-10-CM | POA: Diagnosis present

## 2019-04-10 DIAGNOSIS — Z841 Family history of disorders of kidney and ureter: Secondary | ICD-10-CM

## 2019-04-10 DIAGNOSIS — N2581 Secondary hyperparathyroidism of renal origin: Secondary | ICD-10-CM | POA: Diagnosis not present

## 2019-04-10 DIAGNOSIS — E785 Hyperlipidemia, unspecified: Secondary | ICD-10-CM | POA: Diagnosis present

## 2019-04-10 DIAGNOSIS — E1140 Type 2 diabetes mellitus with diabetic neuropathy, unspecified: Secondary | ICD-10-CM | POA: Diagnosis not present

## 2019-04-10 DIAGNOSIS — I5030 Unspecified diastolic (congestive) heart failure: Secondary | ICD-10-CM | POA: Diagnosis not present

## 2019-04-10 DIAGNOSIS — I731 Thromboangiitis obliterans [Buerger's disease]: Secondary | ICD-10-CM | POA: Diagnosis present

## 2019-04-10 DIAGNOSIS — K5900 Constipation, unspecified: Secondary | ICD-10-CM | POA: Diagnosis present

## 2019-04-10 DIAGNOSIS — N049 Nephrotic syndrome with unspecified morphologic changes: Secondary | ICD-10-CM

## 2019-04-10 DIAGNOSIS — I4892 Unspecified atrial flutter: Secondary | ICD-10-CM | POA: Diagnosis present

## 2019-04-10 DIAGNOSIS — Z8679 Personal history of other diseases of the circulatory system: Secondary | ICD-10-CM

## 2019-04-10 DIAGNOSIS — Z9861 Coronary angioplasty status: Secondary | ICD-10-CM

## 2019-04-10 LAB — CBC
HCT: 32.1 % — ABNORMAL LOW (ref 39.0–52.0)
HCT: 30.4 % — ABNORMAL LOW (ref 39.0–52.0)
Hemoglobin: 10.3 g/dL — ABNORMAL LOW (ref 13.0–17.0)
Hemoglobin: 9.9 g/dL — ABNORMAL LOW (ref 13.0–17.0)
MCH: 33.4 pg (ref 26.0–34.0)
MCH: 33.6 pg (ref 26.0–34.0)
MCHC: 32.1 g/dL (ref 30.0–36.0)
MCHC: 32.6 g/dL (ref 30.0–36.0)
MCV: 104.2 fL — ABNORMAL HIGH (ref 80.0–100.0)
MCV: 103.1 fL — ABNORMAL HIGH (ref 80.0–100.0)
Platelets: 189 10*3/uL (ref 150–400)
Platelets: 167 10*3/uL (ref 150–400)
RBC: 3.08 MIL/uL — ABNORMAL LOW (ref 4.22–5.81)
RBC: 2.95 MIL/uL — ABNORMAL LOW (ref 4.22–5.81)
RDW: 15.9 % — ABNORMAL HIGH (ref 11.5–15.5)
RDW: 15.8 % — ABNORMAL HIGH (ref 11.5–15.5)
WBC: 5.9 10*3/uL (ref 4.0–10.5)
WBC: 6 10*3/uL (ref 4.0–10.5)
nRBC: 0 % (ref 0.0–0.2)
nRBC: 0 % (ref 0.0–0.2)

## 2019-04-10 LAB — HEPATIC FUNCTION PANEL
ALT: 18 U/L (ref 0–44)
AST: 19 U/L (ref 15–41)
Albumin: 3.4 g/dL — ABNORMAL LOW (ref 3.5–5.0)
Alkaline Phosphatase: 59 U/L (ref 38–126)
Bilirubin, Direct: 0.2 mg/dL (ref 0.0–0.2)
Indirect Bilirubin: 0.1 mg/dL — ABNORMAL LOW (ref 0.3–0.9)
Total Bilirubin: 0.3 mg/dL (ref 0.3–1.2)
Total Protein: 7 g/dL (ref 6.5–8.1)

## 2019-04-10 LAB — CBC WITH DIFFERENTIAL/PLATELET
Abs Immature Granulocytes: 0.02 10*3/uL (ref 0.00–0.07)
Basophils Absolute: 0.1 10*3/uL (ref 0.0–0.1)
Basophils Relative: 1 %
Eosinophils Absolute: 0.1 10*3/uL (ref 0.0–0.5)
Eosinophils Relative: 2 %
HCT: 32.4 % — ABNORMAL LOW (ref 39.0–52.0)
Hemoglobin: 10.7 g/dL — ABNORMAL LOW (ref 13.0–17.0)
Immature Granulocytes: 0 %
Lymphocytes Relative: 16 %
Lymphs Abs: 1 10*3/uL (ref 0.7–4.0)
MCH: 34.3 pg — ABNORMAL HIGH (ref 26.0–34.0)
MCHC: 33 g/dL (ref 30.0–36.0)
MCV: 103.8 fL — ABNORMAL HIGH (ref 80.0–100.0)
Monocytes Absolute: 0.7 10*3/uL (ref 0.1–1.0)
Monocytes Relative: 11 %
Neutro Abs: 4.2 10*3/uL (ref 1.7–7.7)
Neutrophils Relative %: 70 %
Platelets: 194 10*3/uL (ref 150–400)
RBC: 3.12 MIL/uL — ABNORMAL LOW (ref 4.22–5.81)
RDW: 15.9 % — ABNORMAL HIGH (ref 11.5–15.5)
WBC: 6.1 10*3/uL (ref 4.0–10.5)
nRBC: 0 % (ref 0.0–0.2)

## 2019-04-10 LAB — PTH, INTACT AND CALCIUM
Calcium, Total (PTH): 8.3 mg/dL — ABNORMAL LOW (ref 8.6–10.2)
PTH: 145 pg/mL — ABNORMAL HIGH (ref 15–65)

## 2019-04-10 LAB — BASIC METABOLIC PANEL
Anion gap: 13 (ref 5–15)
BUN: 63 mg/dL — ABNORMAL HIGH (ref 8–23)
CO2: 22 mmol/L (ref 22–32)
Calcium: 8.4 mg/dL — ABNORMAL LOW (ref 8.9–10.3)
Chloride: 105 mmol/L (ref 98–111)
Creatinine, Ser: 6.33 mg/dL — ABNORMAL HIGH (ref 0.61–1.24)
GFR calc Af Amer: 10 mL/min — ABNORMAL LOW (ref >60)
GFR calc non Af Amer: 8 mL/min — ABNORMAL LOW (ref >60)
Glucose, Bld: 105 mg/dL — ABNORMAL HIGH (ref 70–99)
Potassium: 3.4 mmol/L — ABNORMAL LOW (ref 3.5–5.1)
Sodium: 140 mmol/L (ref 135–145)

## 2019-04-10 LAB — CREATININE, SERUM
Creatinine, Ser: 6.06 mg/dL — ABNORMAL HIGH (ref 0.61–1.24)
GFR calc Af Amer: 10 mL/min — ABNORMAL LOW (ref >60)
GFR calc non Af Amer: 9 mL/min — ABNORMAL LOW (ref >60)

## 2019-04-10 LAB — TROPONIN I (HIGH SENSITIVITY)
Troponin I (High Sensitivity): 36 ng/L — ABNORMAL HIGH (ref <18)
Troponin I (High Sensitivity): 39 ng/L — ABNORMAL HIGH (ref <18)

## 2019-04-10 LAB — CBG MONITORING, ED: Glucose-Capillary: 92 mg/dL (ref 70–99)

## 2019-04-10 LAB — BRAIN NATRIURETIC PEPTIDE: B Natriuretic Peptide: 1349.3 pg/mL — ABNORMAL HIGH (ref 0.0–100.0)

## 2019-04-10 LAB — POC SARS CORONAVIRUS 2 AG -  ED: SARS Coronavirus 2 Ag: NEGATIVE

## 2019-04-10 MED ORDER — ACETAMINOPHEN 650 MG RE SUPP: 650.0000 mg | Freq: Four times a day (QID) | RECTAL | Status: DC | PRN

## 2019-04-10 MED ORDER — BUDESONIDE 0.25 MG/2ML IN SUSP: 0.2500 mg | Freq: Two times a day (BID) | RESPIRATORY_TRACT | Status: DC | PRN

## 2019-04-10 MED ORDER — CARVEDILOL 25 MG PO TABS
25.0000 mg | ORAL_TABLET | Freq: Two times a day (BID) | ORAL | Status: DC
  Administered 2019-04-10 – 2019-04-13 (×4): 25 mg via ORAL
  Filled 2019-04-10: qty 1
  Filled 2019-04-10: qty 2
  Filled 2019-04-10 (×2): qty 1
  Filled 2019-04-10: qty 2

## 2019-04-10 MED ORDER — POTASSIUM CHLORIDE 20 MEQ PO PACK
40.0000 meq | PACK | Freq: Once | ORAL | Status: AC
  Administered 2019-04-10: 40 meq via ORAL
  Filled 2019-04-10: qty 2

## 2019-04-10 MED ORDER — HEPARIN SODIUM (PORCINE) 5000 UNIT/ML IJ SOLN
5000.0000 [IU] | Freq: Three times a day (TID) | INTRAMUSCULAR | Status: DC
  Administered 2019-04-11 – 2019-04-13 (×5): 5000 [IU] via SUBCUTANEOUS
  Filled 2019-04-10 (×6): qty 1

## 2019-04-10 MED ORDER — ONDANSETRON HCL 4 MG PO TABS: 4.0000 mg | ORAL_TABLET | Freq: Four times a day (QID) | ORAL | Status: DC | PRN

## 2019-04-10 MED ORDER — ACETAMINOPHEN 325 MG PO TABS: 650.0000 mg | ORAL_TABLET | Freq: Four times a day (QID) | ORAL | Status: DC | PRN

## 2019-04-10 MED ORDER — POLYETHYLENE GLYCOL 3350 17 G PO PACK: 17.0000 g | PACK | Freq: Every day | ORAL | Status: DC | PRN

## 2019-04-10 MED ORDER — POLYSACCHARIDE IRON COMPLEX 150 MG PO CAPS
150.0000 mg | ORAL_CAPSULE | Freq: Every morning | ORAL | Status: DC
  Administered 2019-04-12 – 2019-04-13 (×2): 150 mg via ORAL
  Filled 2019-04-10 (×3): qty 1

## 2019-04-10 MED ORDER — HYDRALAZINE HCL 50 MG PO TABS
100.0000 mg | ORAL_TABLET | Freq: Three times a day (TID) | ORAL | Status: DC
  Administered 2019-04-11 – 2019-04-13 (×5): 100 mg via ORAL
  Filled 2019-04-10 (×5): qty 2
  Filled 2019-04-10: qty 10

## 2019-04-10 MED ORDER — ONDANSETRON HCL 4 MG/2ML IJ SOLN: 4.0000 mg | Freq: Four times a day (QID) | INTRAMUSCULAR | Status: DC | PRN

## 2019-04-10 MED ORDER — PANTOPRAZOLE SODIUM 40 MG PO TBEC
40.0000 mg | DELAYED_RELEASE_TABLET | Freq: Every day | ORAL | Status: DC
  Administered 2019-04-11 – 2019-04-13 (×3): 40 mg via ORAL
  Filled 2019-04-10 (×3): qty 1

## 2019-04-10 MED ORDER — FUROSEMIDE 10 MG/ML IJ SOLN
120.0000 mg | Freq: Once | INTRAVENOUS | Status: AC
  Administered 2019-04-10: 120 mg via INTRAVENOUS
  Filled 2019-04-10: qty 12
  Filled 2019-04-10: qty 2

## 2019-04-10 MED ORDER — SENNOSIDES-DOCUSATE SODIUM 8.6-50 MG PO TABS: 1.0000 | ORAL_TABLET | Freq: Every evening | ORAL | Status: DC | PRN

## 2019-04-10 MED ORDER — VITAMIN D 25 MCG (1000 UNIT) PO TABS
2000.0000 [IU] | ORAL_TABLET | Freq: Every morning | ORAL | Status: DC
  Administered 2019-04-11 – 2019-04-13 (×3): 2000 [IU] via ORAL
  Filled 2019-04-10 (×3): qty 2

## 2019-04-10 MED ORDER — MELATONIN 3 MG PO TABS
3.0000 mg | ORAL_TABLET | Freq: Every evening | ORAL | Status: DC | PRN
  Administered 2019-04-10 – 2019-04-12 (×2): 3 mg via ORAL
  Filled 2019-04-10 (×3): qty 1

## 2019-04-10 MED ORDER — DILTIAZEM HCL 60 MG PO TABS
30.0000 mg | ORAL_TABLET | Freq: Two times a day (BID) | ORAL | Status: DC
  Administered 2019-04-10 – 2019-04-13 (×5): 30 mg via ORAL
  Filled 2019-04-10 (×6): qty 1

## 2019-04-10 MED ORDER — SODIUM CHLORIDE 0.9% FLUSH: 3.0000 mL | Freq: Once | INTRAVENOUS | Status: DC

## 2019-04-10 MED ORDER — ALBUTEROL SULFATE (2.5 MG/3ML) 0.083% IN NEBU: 3.0000 mL | INHALATION_SOLUTION | Freq: Four times a day (QID) | RESPIRATORY_TRACT | Status: DC | PRN

## 2019-04-10 MED ORDER — ADULT MULTIVITAMIN W/MINERALS CH
1.0000 | ORAL_TABLET | Freq: Every morning | ORAL | Status: DC
  Administered 2019-04-11 – 2019-04-13 (×3): 1 via ORAL
  Filled 2019-04-10 (×3): qty 1

## 2019-04-10 MED ORDER — ATORVASTATIN CALCIUM 40 MG PO TABS
40.0000 mg | ORAL_TABLET | Freq: Every morning | ORAL | Status: DC
  Administered 2019-04-11 – 2019-04-13 (×3): 40 mg via ORAL
  Filled 2019-04-10 (×3): qty 1

## 2019-04-10 NOTE — Consult Note (Signed)
Reason for Consult: To manage dialysis and dialysis related needs Referring Physician: Dr Marlise Eves is an 68 y.o. male.   HPI: [name] is a 30M with a PMH significant for CKD V, HTN, HLD, Aflutter, CAD, and peripheral artery disease who is now seen in consultation at the request of Dr. Roderic Palau for management of advanced CKD.  [name] is followed by Dr Theador Hawthorne.  Saw him yesterday and noted that he had gained 20 lbs in 2 weeks.  ++LE edema, orthopnea, dyspnea.  He has one AVF that failed and a nonmaturing L RC AVF.  He is here for dialysis initiation.    [name] notes that doubling his Lasix didn't really help last week.  He has a chronic cough.  COVID negative.  Labs remarkable for Cr of 6.33, BUN 63, K 3.4, CO2 22, Hgb 10.7.  In this setting we are asked to see.  [name] denies f/c, n/v, CP, HA, blurry vision, focal weakness/ numbness.     Past Medical History:  Diagnosis Date  . AAA (abdominal aortic aneurysm) ([place]) 06/2016   Mild increase in size of 3.4 cm infrarenal abdominal aortic aneurysm  . Anemia   . Aortic atherosclerosis ([place])   . Asthma   . Atrial flutter ([place])   . AVF (arteriovenous fistula) ([facility])    Left forearm  . CAD (coronary artery disease)    a. s/p prior stenting of RCA in 1999 b. cath in 2003 showing moderate CAD c. NST in 2016 showing small area of ischemic and low-risk  . CHF (congestive heart failure) ([place])   . CKD (chronic kidney disease), stage V ([place])    kidney transplant evaluation  . COPD (chronic obstructive pulmonary disease) ([place])    with ongoing tobacco use and patient failed sham takes  . Diverticulosis 2018   Mild sigmoid colon diverticulosis.  Marland Kitchen DM2 (diabetes mellitus, type 2) ([place])   . Dyslipidemia   . Edema    chronic lower extremity secondary to right heart failure and chronic venous insufficiency  . Fatigue   . Fatty liver 2010   Mild  . Headache   . HTN (hypertension)   . Morbid obesity ([place])   . Multiple pulmonary nodules 05/2018   Bilateral   . Pneumonia   . Presence of permanent cardiac pacemaker   . 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   Marland Kitchen Reflux esophagitis    hx  . Sleep apnea    [name] STATES ONE TEST WAS POSITIVE AND ANOTHER TEST WAS NEGATIVE  . Two-vessel coronary artery disease    moderate. by cath in 2003. Status post stenting of the mdi RCA November 1999 normal left ventricular ejection fraction  . Wears glasses   . Wears hearing aid    B/L    Past Surgical History:  Procedure Laterality Date  . ABDOMINAL SURGERY    . APPENDECTOMY    . AV FISTULA PLACEMENT Right 03/11/2018   Procedure: CREATION Brachiocephalic Fistula RIGHT ARM;  Surgeon: Rosetta Posner, MD;  Location: [facility];  Service: Vascular;  Laterality: Right;  . BACK SURGERY     x3  . BIOPSY  11/12/2018   Procedure: BIOPSY;  Surgeon: Laurence Spates, MD;  Location: [facility];  Service: Endoscopy;;  . CARDIAC CATHETERIZATION     stent  . CHOLECYSTECTOMY    . CLOSED REDUCTION SHOULDER DISLOCATION    . COLONOSCOPY W/ BIOPSIES AND POLYPECTOMY    . COLONOSCOPY  WITH PROPOFOL N/A 11/12/2018   Procedure: COLONOSCOPY WITH PROPOFOL;  Surgeon: Laurence Spates, MD;  [facility];  Service: Endoscopy;  Laterality: N/A;  . diabetic ulcers    . ESOPHAGOGASTRODUODENOSCOPY (EGD) WITH PROPOFOL N/A 11/12/2018   Procedure: ESOPHAGOGASTRODUODENOSCOPY (EGD) WITH PROPOFOL;  Surgeon: Laurence Spates, MD;  [facility];  Service: Endoscopy;  Laterality: N/A;  . GROIN EXPLORATION    . HEMOSTASIS CLIP PLACEMENT  11/12/2018   Procedure: HEMOSTASIS CLIP PLACEMENT;  Surgeon: Laurence Spates, MD;  [facility];  Service: Endoscopy;;  . INSERTION OF ARTERIOVENOUS (AV) ARTEGRAFT ARM Left 03/18/2018   Procedure: INSERTION OF ARTERIOVENOUS (AV) FISTULA;  Surgeon: Rosetta Posner, MD;  Location: [facility];  Service: Vascular;  Laterality: Left;  . LIGATION ARTERIOVENOUS GORTEX GRAFT Right 03/18/2018    Procedure: LIGATION ARTERIOVENOUS FISTULA;  Surgeon: Rosetta Posner, MD;  Location: [facility];  Service: Vascular;  Laterality: Right;  . OSTECTOMY Left 04/23/2017   Procedure: OSTECTOMY LEFT GREAT TOE;  Surgeon: [name], DPM;  Location: [facility];  Service: Podiatry;  Laterality: Left;  left great toe  . POLYPECTOMY  11/12/2018   Procedure: POLYPECTOMY;  Surgeon: Laurence Spates, MD;  [facility];  Service: Endoscopy;;  . TUMOR REMOVAL     small intestine x3    Family History  Problem Relation Age of Onset  . Diabetes Mother   . Alcohol abuse Father     Social History:  reports that he has been smoking cigarettes. He started smoking about 50 years ago. He has a 40.00 pack-year smoking history. He has never used smokeless tobacco. He reports previous alcohol use. He reports that he does not use drugs.  Allergies: No Known Allergies  Medications:  Scheduled: . sodium chloride flush  3 mL Intravenous Once    Results for orders placed or performed during the hospital encounter of 04/10/19 (from the past 48 hour(s))  Basic metabolic panel     Status: Abnormal   Collection Time: 04/10/19 12:44 PM  Result Value Ref Range   Sodium 140 135 - 145 mmol/L   Potassium 3.4 (L) 3.5 - 5.1 mmol/L   Chloride 105 98 - 111 mmol/L   CO2 22 22 - 32 mmol/L   Glucose, Bld 105 (H) 70 - 99 mg/dL   BUN 63 (H) 8 - 23 mg/dL   Creatinine, Ser 6.33 (H) 0.61 - 1.24 mg/dL   Calcium 8.4 (L) 8.9 - 10.3 mg/dL   GFR calc non Af Amer 8 (L) >60 mL/min   GFR calc Af Amer 10 (L) >60 mL/min   Anion gap 13 5 - 15    Comment: Performed at [facility], [address], [place], [place] [address]  CBC     Status: Abnormal   Collection Time: 04/10/19 12:44 PM  Result Value Ref Range   WBC 5.9 4.0 - 10.5 K/uL   RBC 3.08 (L) 4.22 - 5.81 MIL/uL   Hemoglobin 10.3 (L) 13.0 - 17.0 g/dL   HCT 32.1 (L) 39.0 - 52.0 %   MCV 104.2 (H) 80.0 - 100.0 fL   MCH 33.4 26.0 - 34.0 pg   MCHC 32.1 30.0 - 36.0 g/dL    RDW 15.9 (H) 11.5 - 15.5 %   Platelets 189 150 - 400 K/uL   nRBC 0.0 0.0 - 0.2 %    Comment: Performed at [facility], [place] [address], [place], [place] [address]  Hepatic function panel     Status: Abnormal  Collection Time: 04/10/19 12:44 PM  Result Value Ref Range   Total Protein 7.0 6.5 - 8.1 g/dL   Albumin 3.4 (L) 3.5 - 5.0 g/dL   AST 19 15 - 41 U/L   ALT 18 0 - 44 U/L   Alkaline Phosphatase 59 38 - 126 U/L   Total Bilirubin 0.3 0.3 - 1.2 mg/dL   Bilirubin, Direct 0.2 0.0 - 0.2 mg/dL   Indirect Bilirubin 0.1 (L) 0.3 - 0.9 mg/dL    Comment: Performed at [facility] [address] [place], [place] [address]  Troponin I (High Sensitivity)     Status: Abnormal   Collection Time: 04/10/19 12:44 PM  Result Value Ref Range   Troponin I (High Sensitivity) 36 (H) <18 ng/L    Comment: (NOTE) Elevated high sensitivity troponin I (hsTnI) values and significant  changes across serial measurements may suggest ACS but many other  chronic and acute conditions are known to elevate hsTnI results.  Refer to the "Links" section for chest pain algorithms and additional  guidance. Performed at [facility], [place] [address] [place], [place] [address]   Brain natriuretic peptide     Status: Abnormal   Collection Time: 04/10/19 12:44 PM  Result Value Ref Range   B Natriuretic Peptide 1,349.3 (H) 0.0 - 100.0 pg/mL    Comment: Performed at [facility] [address], [place], [place] [address]  CBC with Differential/Platelet     Status: Abnormal   Collection Time: 04/10/19 12:44 PM  Result Value Ref Range   WBC 6.1 4.0 - 10.5 K/uL   RBC 3.12 (L) 4.22 - 5.81 MIL/uL   Hemoglobin 10.7 (L) 13.0 - 17.0 g/dL   HCT 32.4 (L) 39.0 - 52.0 %   MCV 103.8 (H) 80.0 - 100.0 fL   MCH 34.3 (H) 26.0 - 34.0 pg   MCHC 33.0 30.0 - 36.0 g/dL   RDW 15.9 (H) 11.5 - 15.5 %   Platelets 194 150 - 400 K/uL   nRBC 0.0 0.0 - 0.2 %   Neutrophils Relative % 70 %   Neutro Abs 4.2 1.7 - 7.7  K/uL   Lymphocytes Relative 16 %   Lymphs Abs 1.0 0.7 - 4.0 K/uL   Monocytes Relative 11 %   Monocytes Absolute 0.7 0.1 - 1.0 K/uL   Eosinophils Relative 2 %   Eosinophils Absolute 0.1 0.0 - 0.5 K/uL   Basophils Relative 1 %   Basophils Absolute 0.1 0.0 - 0.1 K/uL   Immature Granulocytes 0 %   Abs Immature Granulocytes 0.02 0.00 - 0.07 K/uL    Comment: Performed at [facility], [address], [place], [place] [address]  POC SARS Coronavirus 2 Ag-ED - Nasal Swab (BD Veritor Kit)     Status: None   Collection Time: 04/10/19  2:04 PM  Result Value Ref Range   SARS Coronavirus 2 Ag NEGATIVE NEGATIVE    Comment: (NOTE) SARS-CoV-2 antigen NOT DETECTED.  Negative results are presumptive.  Negative results do not preclude SARS-CoV-2 infection and should not be used as the sole basis for treatment or other patient management decisions, including infection  control decisions, particularly in the presence of clinical signs and  symptoms consistent with COVID-19, or in those who have been in contact with the virus.  Negative results must be combined with clinical observations, patient history, and epidemiological information. The expected result is Negative. Fact Sheet for Patients: PodPark.tn Fact Sheet for Healthcare Providers: GiftContent.is This  test is not yet approved or cleared by the [place] and  has been authorized for detection and/or diagnosis of SARS-CoV-2 by FDA under an Emergency Use Authorization (EUA).  This EUA will remain in effect (meaning this test can be used) for the duration of  the COVID-19 de claration under Section 564(b)(1) of the Act, 21 U.S.C. section 360bbb-3(b)(1), unless the authorization is terminated or revoked sooner.   Troponin I (High Sensitivity)     Status: Abnormal   Collection Time: 04/10/19  3:07 PM  Result Value Ref Range   Troponin I (High Sensitivity) 39 (H) <18 ng/L     Comment: (NOTE) Elevated high sensitivity troponin I (hsTnI) values and significant  changes across serial measurements may suggest ACS but many other  chronic and acute conditions are known to elevate hsTnI results.  Refer to the "Links" section for chest pain algorithms and additional  guidance. Performed at [facility], [place] [address] [place], [place] [address]   CBG monitoring, ED     Status: None   Collection Time: 04/10/19  3:12 PM  Result Value Ref Range   Glucose-Capillary 92 70 - 99 mg/dL    Dg Chest 2 View  Result Date: 04/10/2019 CLINICAL DATA:  Dyspnea EXAM: CHEST - 2 VIEW COMPARISON:  11/05/2017 chest radiograph. FINDINGS: Stable configuration of 2 lead left subclavian pacemaker. Stable cardiomediastinal silhouette with top-normal heart size. No pneumothorax. No pleural effusion. Lungs appear clear, with no acute consolidative airspace disease and no pulmonary edema. IMPRESSION: No active cardiopulmonary disease. Electronically Signed   By: Ilona Sorrel M.D.   On: 04/10/2019 13:03    ROS: all other systems reviewed and are negative except as per HPI Blood pressure (!) 148/67, pulse 70, temperature 98.2 F (36.8 C), temperature source Oral, resp. rate 18, SpO2 97 %. .  GEN older gentleman, NAD, sitting in bed HEENT EOMI PERRL NECK + JVD PULM normal WOB, occasional deep wet cough, some mild expiratory wheezes CV RRR II/VI systolic murmur ABD soft, nontender, NABS EXT 3+ LE edema NEURO AAO x 3 no asterixis SKIN no rashes ACCESS: L RC fistula, nonmatured  Assessment/Plan: 1 Volume overload: Advanced CKD.  Will need to initiate dialysis.  He needs access.  I'll consult IR for [facility]- would like to start dialysis by Monday.  Will give high-dose lasix in the meantime.   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 Anemia of ESRD:  hgb 10.7, gets Procrit 4000 units every 2 weeks, last given  11/18  5. Metabolic Bone Disease: Phos OK 6.  Dispo: will need to initiate CLIP  Madelon Lips 04/10/2019, 7:19 PM

## 2019-04-10 NOTE — H&P (Addendum)
Date: 04/10/2019               Patient Name:  [name] MRN: [id number]  DOB: 1950-10-03 Age / Sex: 68 y.o., male   PCP: Manon Hilding, MD         Medical Service: Internal Medicine Teaching Service         Attending Physician: Dr. Aldine Contes, MD    First Contact: Dr. Madilyn Fireman Pager: [phone]   Second Contact: Dr. Koleen Distance Pager: [phone]       After Hours (After 5p/  First Contact Pager: [phone]  weekends / holidays): Second Contact Pager: [phone]   Chief Complaint: Shortness of breath  History of Present Illness: Patient is a 68 year old male with past medical history of CKD stage V, HFpEF, hypertension, diabetes mellitus, COPD, hyperlipidemia, atrial flutter, coronary artery disease, peripheral artery disease who presents for shortness of breath in the setting of worsening renal disease, and was instructed by nephrologist to present for dialysis initiation.  Patient reports that he has gained approximately 20 pounds in the last 2 weeks, has worsening peripheral edema, and worsening shortness of breath.  Patient states he sleeps with one pillow and frequently awakes with dyspnea.  He reports chronic cough associated with cigarette usage with cough worsening over the past few days.  He also has occasional nausea with dry heaves which occurs multiple times throughout the day.  Patient denies fever, chills, chest pain, abdominal pain, dysuria.  Patient has noticed no change in the volume/frequency of urination.  Patient follows with [facility].  At office visit yesterday, patient was reluctant to initiation of HD and torsemide was increased from 100 mg BID to 100 mg TID. Patient with failed right AV fistula (placed 03/11/2018). Patient with non-matured left radiocephalic fistula. Patient going through kidney transplant evaluation process.  Disease Background Per Chart Review: Patient diagnosed with renal disease in 2015-2016 thought to be secondary to diabetes, family  history.  Patient was diagnosed with diabetes in 2000 was on insulin for 20 years which was held 6 months ago.  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 fib/flutter.  He was on anticoagulation for many years, most recently Eliquis that was held in June 2020 because of GI bleed.  Patient does not have history of overt GI bleed but his H&H was down drifting while on Eliquis and his colonoscopy showed some colonic tears, and decision was made to hold Eliquis in June 2020.  Patient does have history of COPD complicated by chronic bronchitis, lung nodules.   Meds:  Current Meds  Medication Sig  . acetaminophen (TYLENOL) 325 MG tablet Take 975 mg by mouth every morning.   Marland Kitchen albuterol (VENTOLIN HFA) 108 (90 BASE) MCG/ACT inhaler Inhale 2 puffs into the lungs every 6 (six) hours as needed for wheezing or shortness of breath.   Marland Kitchen atorvastatin (LIPITOR) 40 MG tablet Take 1 tablet (40 mg total) by mouth every evening. (Patient taking differently: Take 40 mg by mouth every morning. )  . beclomethasone (QVAR) 80 MCG/ACT inhaler Inhale 2 puffs into the lungs 2 (two) times daily as needed (shortness of breath).  . carvedilol (COREG) 25 MG tablet TAKE ONE TABLET BY MOUTH TWICE DAILY. (Patient taking differently: Take 25 mg by mouth 2 (two) times daily with a meal. )  .  Cholecalciferol (VITAMIN D3) 2000 units TABS Take 2,000 Units by mouth every morning.   . diltiazem (CARDIZEM) 30 MG tablet TAKE ONE TABLET BY MOUTH TWICE DAILY. (Patient taking differently: Take 30 mg by mouth 2 (two) times daily. )  . epoetin alfa (EPOGEN) 4000 UNIT/ML injection Inject 4,000 Units into the vein See admin instructions. Infusion done as needed for hematocrit <10  . hydrALAZINE (APRESOLINE) 100 MG tablet  TAKE ONE TABLET BY MOUTH THREE TIMES DAILY (Patient taking differently: Take 100-200 mg by mouth See admin instructions. Take two tablets (200 mg) by mouth every morning and one tablet (100 mg) at night)  . insulin glargine (LANTUS) 100 unit/mL SOPN Inject 35 Units into the skin at bedtime as needed (CBG >150).  . iron polysaccharides (NIFEREX) 150 MG capsule Take 150 mg by mouth every morning.  . linaclotide (LINZESS) 290 MCG CAPS capsule Take 290 mcg by mouth daily as needed (constipation).  . Melatonin 10 MG TABS Take 10 mg by mouth at bedtime as needed (sleep).   . Multiple Vitamin (MULTIVITAMIN WITH MINERALS) TABS tablet Take 1 tablet by mouth every morning.   Marland Kitchen omeprazole (PRILOSEC) 20 MG capsule Take 20 mg by mouth every morning.   . polyethylene glycol (MIRALAX / GLYCOLAX) packet Take 17 g by mouth every morning. Mix in morning coffee  . potassium chloride SA (K-DUR,KLOR-CON) 20 MEQ tablet Take 1 tablet (20 mEq total) by mouth daily. (Patient taking differently: Take 20 mEq by mouth every morning. )  . Sennosides 25 MG TABS Take 50 mg by mouth at bedtime.  . sildenafil (VIAGRA) 100 MG tablet Take 1 tablet (100 mg total) by mouth as needed for erectile dysfunction. (Patient taking differently: Take 100 mg by mouth daily as needed for erectile dysfunction. )  . silver sulfADIAZINE (SILVADENE) 1 % cream Apply 1 application topically daily as needed (For wound care with dressing).   . torsemide (DEMADEX) 100 MG tablet Take 100 mg by mouth 2 (two) times daily.      Allergies: Allergies as of 04/10/2019  . (No Known Allergies)   Past Medical History:  Diagnosis Date  . AAA (abdominal aortic aneurysm) ([place]) 06/2016   Mild increase in size of 3.4 cm infrarenal abdominal aortic aneurysm  . Anemia   . Aortic atherosclerosis ([place])   . Asthma   . Atrial flutter ([place])   . AVF (arteriovenous fistula) ([facility])    Left forearm  . CAD (coronary artery disease)    a. s/p prior stenting of RCA in  1999 b. cath in 2003 showing moderate CAD c. NST in 2016 showing small area of ischemic and low-risk  . CHF (congestive heart failure) ([place])   . CKD (chronic kidney disease), stage V ([place])    kidney transplant evaluation  . COPD (chronic obstructive pulmonary disease) ([place])    with ongoing tobacco use and patient failed sham takes  . Diverticulosis 2018   Mild sigmoid colon diverticulosis.  Marland Kitchen DM2 (diabetes mellitus, type 2) ([place])   . Dyslipidemia   . Edema    chronic lower extremity secondary to right heart failure and chronic venous insufficiency  . Fatigue   . Fatty liver 2010   Mild  . Headache   . HTN (hypertension)   . Morbid obesity ([place])   . Multiple pulmonary nodules 05/2018   Bilateral  . Pneumonia   . Presence of permanent cardiac pacemaker   . PVD (peripheral vascular disease) ([place])    with toe amputations secondary to  Buerger's disease.   Marland Kitchen Reflux esophagitis    hx  . Sleep apnea    PT STATES ONE TEST WAS POSITIVE AND ANOTHER TEST WAS NEGATIVE  . Two-vessel coronary artery disease    moderate. by cath in 2003. Status post stenting of the mdi RCA November 1999 normal left ventricular ejection fraction  . Wears glasses   . 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   Physical Exam: Blood pressure (!) 148/67, pulse 70, temperature 98.2 F (36.8 C), temperature source Oral, resp. rate 18, SpO2 97 %. Physical Exam  Constitutional: He is well-developed, well-nourished, and in no distress.  HENT:  Head: Normocephalic and atraumatic.  Eyes: EOM are normal. Right eye exhibits no discharge. Left eye exhibits no discharge.  Neck: Normal range of  motion. No tracheal deviation present.  Cardiovascular: Normal rate and regular rhythm. Exam reveals no gallop and no friction rub.  No murmur heard. Pulmonary/Chest: Effort normal and breath sounds normal. No respiratory distress. He has no wheezes. He has no rales.  Abdominal: Soft. He exhibits no distension. There is no abdominal tenderness. There is no rebound and no guarding.  Musculoskeletal: Normal range of motion.        General: Edema (2+ bilateral pitting edema up to knees bilaterally) present. No tenderness or deformity.  Neurological: He is alert. Coordination normal.  Skin: Skin is warm and dry. No rash noted. He is not diaphoretic. No erythema.  Psychiatric: Memory and judgment normal.    EKG: personally reviewed my interpretation is atrial fibrillation with frequent ventricular-paced complexes  CXR: personally reviewed my interpretation is no acute cardiopulmonary process  Assessment & Plan by Problem: Active Problems:   ESRD (end stage renal disease) [facility])  Patient is a 68 year old male with past medical history of CKD stage V, HFpEF, hypertension, COPD, atrial flutter, coronary artery disease who presents with volume overload for HD initiation.  # ESRD: # Volume overload: Patient with worsening renal function, eGFR of 8.  BUN of 63.  Patient with symptoms of volume overload and mild symptoms of uremia including nausea, fatigue.  Patient with left radiocephalic fistula, not matured.  IR consulted per nephrology for placement of tunneled dialysis catheter.  Goal to start dialysis by Monday and diureses with lasix in the meantime. * Lasix 120 mg x1 * Zofran PRN for nausea * Cardiac monitoring * RFP for morning * Strict I/Os * Will need CLIP  # Anemia of ESRD: Hgb of 9.9. Gets epoetin alfa 4000 U every 2 weeks, last given 11/18 * Continue home niferex (iron) capsule 150 mg QD  # HFpEF: Echocardiogram from 12/15/2018 notes normal EF and impaired relaxation.  BNP of 1349,  elevation is in the setting of ESRD.  Symptoms may be in part secondary to heart failure exacerbation.  Likely to improve following diuresis and hemodialysis initiation.  # Hypertension: Systolic BP ranging from 811-572 in ER. Will likely improve following HD. Continue home BP medications * Carvedilol 25 mg BID * Hydralazine 100 mg TID  # CAD: # Tachybradycardia syndrome: # Paroxysmal A. Fib/flutter:  Patient with history of coronary artery disease status post  PCI 1999 to RCA, tachybradycardia syndrome status post dual-chamber [facility] Scientific PPM, paroxysmal A. fib/flutter.  Patient not on Eliquis 2/2 to suspected GI blood loss. * Diltiazem 30 mg BID * Atorvastatin 40 mg QD  # Diabetes mellitus: Patient with history of diabetes mellitus since 2000.  Per patient he has been off insulin for 1 year and per chart review 6 months.  Last hemoglobin A1c of 5.8 in November 2018.  Decrease insulin requirement likely secondary to progression of renal disease.  # COPD: Albuterol Q6HR PRN  # GERD: Pantoprazole 40 mg QD # Constipation: Miralax 17 g daily PRN + Senna 1 tablet PRN at bedtime   # DVT ppx: Heparin 5000 U SubQ Q8HRs # CODE STATUS: DNR/DNI Dispo: Admit patient to Inpatient with expected length of stay greater than 2 midnights.  Signed: Jeanmarie Hubert, MD 04/10/2019, 9:22 PM  Pager: [phone]

## 2019-04-10 NOTE — ED Provider Notes (Addendum)
[facility] EMERGENCY DEPARTMENT Provider Note   CSN: [id number] Arrival date & time: 04/10/19  1223     History   Chief Complaint Chief Complaint  Patient presents with  . Shortness of Breath  . renal failure    HPI [name] is a 68 y.o. male.     Patient complains of severe swelling to his legs weakness and shortness of breath.  Patient has significant renal disease and saw his nephrologist yesterday who wanted to admit him for dialysis but the patient was hoping to wait till after Thanksgiving.  Patient states that today he was short of breath and felt so bad he called his nephrologist and the nephrologist to go to the emergency department we will admit you for dialysis.  Patient has a history of heart disease diabetes COPD  The history is provided by the patient. No language interpreter was used.  Shortness of Breath Severity:  Moderate Onset quality:  Gradual Timing:  Constant Progression:  Worsening Chronicity:  New Context: activity   Relieved by:  Nothing Worsened by:  Nothing Associated symptoms: no abdominal pain, no chest pain, no cough, no headaches and no rash     Past Medical History:  Diagnosis Date  . AAA (abdominal aortic aneurysm) ([place]) 06/2016   Mild increase in size of 3.4 cm infrarenal abdominal aortic aneurysm  . Anemia   . Aortic atherosclerosis ([place])   . Asthma   . Atrial flutter ([place])   . AVF (arteriovenous fistula) (HCC)    Left forearm  . CAD (coronary artery disease)    a. s/p prior stenting of RCA in 1999 b. cath in 2003 showing moderate CAD c. NST in 2016 showing small area of ischemic and low-risk  . CHF (congestive heart failure) ([place])   . CKD (chronic kidney disease), stage V ([place])    kidney transplant evaluation  . COPD (chronic obstructive pulmonary disease) ([place])    with ongoing tobacco use and patient failed sham takes  . Diverticulosis 2018   Mild sigmoid colon diverticulosis.  Marland Kitchen DM2 (diabetes  mellitus, type 2) ([place])   . Dyslipidemia   . Edema    chronic lower extremity secondary to right heart failure and chronic venous insufficiency  . Fatigue   . Fatty liver 2010   Mild  . Headache   . HTN (hypertension)   . Morbid obesity ([place])   . Multiple pulmonary nodules 05/2018   Bilateral  . Pneumonia   . Presence of permanent cardiac pacemaker   . 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   Marland Kitchen Reflux esophagitis    hx  . Sleep apnea    PT STATES ONE TEST WAS POSITIVE AND ANOTHER TEST WAS NEGATIVE  . Two-vessel coronary artery disease    moderate. by cath in 2003. Status post stenting of the mdi RCA November 1999 normal left ventricular ejection fraction  . Wears glasses   . Wears hearing aid    B/L    Patient Active Problem List   Diagnosis Date Noted  . 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[place]) 03/18/2018  . Chronic renal insufficiency, stage 3 (moderate) 03/18/2018  . Diffuse wheezing 12/02/2017  . OSA and COPD overlap syndrome ([place]) 12/02/2017  . Sleeps in sitting position due to orthopnea 12/02/2017  . Pacemaker 12/02/2017  . Bradycardia with 31-40 beats per minute 12/02/2017  . Coronary artery disease due to calcified coronary lesion 12/02/2017  . Insomnia  due to medical condition 12/02/2017  . Snoring 12/02/2017  . Chronic diastolic CHF (congestive heart failure) ([place]) 05/10/2017  . Atypical chest pain   . Chest pain 05/09/2017  . Atrial flutter ([place]) 05/09/2017  . Acute renal failure superimposed on stage 4 chronic kidney disease ([place]) 05/09/2017  . Hypokalemia 04/05/2016  . Diabetes mellitus with nephropathy ([place]) 04/05/2016  . Hyponatremia 04/05/2016  . Hematuria 04/05/2016  . Diarrhea 04/05/2016  . Bilateral diabetic foot ulcer associated with secondary diabetes mellitus ([place]) 04/05/2016  . Streptococcal bacteremia 04/05/2016  . Cellulitis of leg, left 04/05/2016  . Sepsis ([place]) 04/04/2016  . CAD  (coronary artery disease) 11/10/2015  . Bilateral lower extremity edema 07/25/2015  . Foot infection 09/27/2014  . Diabetic foot infection ([place]) 09/27/2014  . CKD stage 3 due to type 2 diabetes mellitus ([place])   . Diabetes mellitus with renal manifestations, uncontrolled ([place])   . Benign essential HTN   . ED (erectile dysfunction) 06/09/2012  . Hypertension 03/11/2011  . OSTEOMYELITIS, ACUTE, ANKLE/FOOT 07/24/2010  . EDEMA 02/15/2010  . MORBID OBESITY 07/05/2009  . TOBACCO ABUSE 07/05/2009  . OBSTRUCTIVE SLEEP APNEA 07/05/2009  . COPD (chronic obstructive pulmonary disease) ([place]) 07/05/2009  . 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 HYPERLIPIDEMIA 03/30/2008  . Generalized anxiety disorder 03/30/2008  . [place] DISEASE 03/30/2008    Past Surgical History:  Procedure Laterality Date  . ABDOMINAL SURGERY    . APPENDECTOMY    . AV FISTULA PLACEMENT Right 03/11/2018   Procedure: CREATION Brachiocephalic Fistula RIGHT ARM;  Surgeon: Rosetta Posner, MD;  Location: [facility];  Service: Vascular;  Laterality: Right;  . BACK SURGERY     x3  . BIOPSY  11/12/2018   Procedure: BIOPSY;  Surgeon: Laurence Spates, MD;  Location: [facility];  Service: Endoscopy;;  . CARDIAC CATHETERIZATION     stent  . CHOLECYSTECTOMY    . CLOSED REDUCTION SHOULDER DISLOCATION    . COLONOSCOPY W/ BIOPSIES AND POLYPECTOMY    . COLONOSCOPY WITH PROPOFOL N/A 11/12/2018   Procedure: COLONOSCOPY WITH PROPOFOL;  Surgeon: Laurence Spates, MD;  Location: [facility];  Service: Endoscopy;  Laterality: N/A;  . diabetic ulcers    . ESOPHAGOGASTRODUODENOSCOPY (EGD) WITH PROPOFOL N/A 11/12/2018   Procedure: ESOPHAGOGASTRODUODENOSCOPY (EGD) WITH PROPOFOL;  Surgeon: Laurence Spates, MD;  [facility]: [facility];  Service: Endoscopy;  Laterality: N/A;  . GROIN EXPLORATION    . HEMOSTASIS CLIP PLACEMENT  11/12/2018   Procedure: HEMOSTASIS CLIP PLACEMENT;  Surgeon: Laurence Spates, MD;  Location: [facility];  Service: Endoscopy;;  . INSERTION OF  ARTERIOVENOUS (AV) ARTEGRAFT ARM Left 03/18/2018   Procedure: INSERTION OF ARTERIOVENOUS (AV) FISTULA;  Surgeon: Rosetta Posner, MD;  Location: [facility];  Service: Vascular;  Laterality: Left;  . LIGATION ARTERIOVENOUS GORTEX GRAFT Right 03/18/2018   Procedure: LIGATION ARTERIOVENOUS FISTULA;  Surgeon: Rosetta Posner, MD;  Location: [facility];  Service: Vascular;  Laterality: Right;  . OSTECTOMY Left 04/23/2017   Procedure: OSTECTOMY LEFT GREAT TOE;  Surgeon: [name], DPM;  Location: [facility];  Service: Podiatry;  Laterality: Left;  left great toe  . POLYPECTOMY  11/12/2018   Procedure: POLYPECTOMY;  Surgeon: Laurence Spates, MD;  Location: [facility];  Service: Endoscopy;;  . TUMOR REMOVAL     small intestine x3        Home Medications    Prior to Admission medications   Medication Sig Start Date End Date Taking? Authorizing Provider  acetaminophen (TYLENOL) 325 MG tablet Take 975 mg by mouth daily.  [provider]  albuterol (VENTOLIN HFA) 108 (90 BASE) MCG/ACT inhaler Inhale 2 puffs into the lungs every 6 (six) hours as needed for wheezing or shortness of breath.     [provider]  atorvastatin (LIPITOR) 40 MG tablet Take 1 tablet (40 mg total) by mouth every evening. 08/24/18   Arnoldo Lenis, MD  carvedilol (COREG) 25 MG tablet TAKE ONE TABLET BY MOUTH TWICE DAILY. 02/15/19   Arnoldo Lenis, MD  Cholecalciferol (VITAMIN D3) 2000 units TABS Take 2,000 Units by mouth daily.     [provider]  diltiazem (CARDIZEM) 30 MG tablet TAKE ONE TABLET BY MOUTH TWICE DAILY. 02/15/19   Arnoldo Lenis, MD  epoetin alfa (EPOGEN) 4000 UNIT/ML injection Inject 4,000 Units into the vein every 14 (fourteen) days. 09/08/18   Fran Lowes, MD  hydrALAZINE (APRESOLINE) 100 MG tablet TAKE ONE TABLET BY MOUTH THREE TIMES DAILY 02/15/19   Arnoldo Lenis, MD  insulin glargine (LANTUS) 100 UNIT/ML injection Inject 35 Units into the skin daily.     [provider]  LINZESS 290 MCG CAPS capsule Take 290 mcg by mouth daily as needed (for constipation).  07/26/15   [provider]  Melatonin 10 MG TABS Take 20 mg by mouth at bedtime.     [provider]  Multiple Vitamin (MULTIVITAMIN WITH MINERALS) TABS tablet Take 1 tablet by mouth daily.    [provider]  omeprazole (PRILOSEC) 20 MG capsule Take 20 mg daily by mouth.    [provider]  oxyCODONE-acetaminophen (PERCOCET) 5-325 MG tablet Take 1 tablet by mouth every 6 (six) hours as needed for severe pain. 03/19/18   Rhyne, Hulen Shouts, PA-C  polyethylene glycol (MIRALAX / GLYCOLAX) packet Take 17 g by mouth daily. With morning coffee    [provider]  potassium chloride SA (K-DUR,KLOR-CON) 20 MEQ tablet Take 1 tablet (20 mEq total) by mouth daily. 05/10/17   Orson Eva, MD  QVAR REDIHALER 80 MCG/ACT inhaler Inhale 2 puffs into the lungs 2 (two) times daily as needed (respiratory issues.).  10/06/17   [provider]  rosuvastatin (CRESTOR) 20 MG tablet Take 20 mg by mouth daily.    [provider]  sildenafil (VIAGRA) 100 MG tablet Take 1 tablet (100 mg total) by mouth as needed for erectile dysfunction. Patient taking differently: Take 100 mg by mouth daily as needed for erectile dysfunction.  01/23/18   Allred, Jeneen Rinks, MD  silver sulfADIAZINE (SILVADENE) 1 % cream Apply 1 application topically daily as needed (For wound care with dressing).     [provider]  torsemide (DEMADEX) 100 MG tablet Take 50 mg by mouth 2 (two) times daily. Changed by Dr. Lowanda Foster this March. 10/06/17   [provider]    Family History Family History  Problem Relation Age of Onset  . Diabetes Mother   . Alcohol abuse Father     Social History Social History   Tobacco Use  . Smoking status: Current Every Day Smoker    Packs/day: 1.00    Years: 40.00    Pack years: 40.00    Types: Cigarettes    Start date: 05/20/1968  .  Smokeless tobacco: Never Used  Substance Use Topics  . Alcohol use: Not Currently    Alcohol/week: 0.0 standard drinks    Comment: rare  . Drug use: No     Allergies   Patient has no known allergies.   Review of Systems Review of Systems  Constitutional: Negative for appetite change and fatigue.  HENT: Negative for congestion, ear discharge and sinus pressure.   Eyes: Negative for discharge.  Respiratory: Positive for shortness of breath. Negative for cough.   Cardiovascular: Negative for chest pain.  Gastrointestinal: Negative for abdominal pain and diarrhea.  Genitourinary: Negative for frequency and hematuria.  Musculoskeletal: Negative for back pain.       Leg edema  Skin: Negative for rash.  Neurological: Negative for seizures and headaches.  Psychiatric/Behavioral: Negative for hallucinations.     Physical Exam Updated Vital Signs BP (!) 148/67   Pulse 70   Temp 98.2 F (36.8 C) (Oral)   Resp 18   SpO2 97%   Physical Exam Constitutional:      Appearance: He is well-developed.  HENT:     Head: Normocephalic.     Nose: Nose normal.  Eyes:     General: No scleral icterus.    Conjunctiva/sclera: Conjunctivae normal.  Neck:     Musculoskeletal: Neck supple.     Thyroid: No thyromegaly.  Cardiovascular:     Rate and Rhythm: Normal rate and regular rhythm.     Heart sounds: No murmur. No friction rub. No gallop.   Pulmonary:     Breath sounds: No stridor. No wheezing or rales.  Chest:     Chest wall: No tenderness.  Abdominal:     General: There is no distension.     Tenderness: There is no abdominal tenderness. There is no rebound.  Musculoskeletal:     Comments: Severe edema left leg  Lymphadenopathy:     Cervical: No cervical adenopathy.  Skin:    Findings: No erythema or rash.  Neurological:     Mental Status: He is alert and oriented to person, place, and time.     Motor: No abnormal muscle tone.     Coordination: Coordination normal.   Psychiatric:        Behavior: Behavior normal.      ED Treatments / Results  Labs (all labs ordered are listed, but only abnormal results are displayed) Labs Reviewed  BASIC METABOLIC PANEL - Abnormal; Notable for the following components:      Result Value   Potassium 3.4 (*)    Glucose, Bld 105 (*)    BUN 63 (*)    Creatinine, Ser 6.33 (*)    Calcium 8.4 (*)    GFR calc non Af Amer 8 (*)    GFR calc Af Amer 10 (*)    All other components within normal limits  CBC - Abnormal; Notable for the following components:   RBC 3.08 (*)    Hemoglobin 10.3 (*)    HCT 32.1 (*)    MCV 104.2 (*)    RDW 15.9 (*)    All other components within normal limits  HEPATIC FUNCTION PANEL - Abnormal; Notable for the following components:   Albumin 3.4 (*)    Indirect Bilirubin 0.1 (*)    All other components within normal limits  BRAIN NATRIURETIC PEPTIDE - Abnormal; Notable for the following components:   B Natriuretic Peptide 1,349.3 (*)    All other components within normal limits  CBC WITH DIFFERENTIAL/PLATELET - Abnormal; Notable for the following components:   RBC 3.12 (*)    Hemoglobin 10.7 (*)    HCT 32.4 (*)    MCV 103.8 (*)    MCH 34.3 (*)    RDW 15.9 (*)    All other components within normal limits  TROPONIN I (HIGH  SENSITIVITY) - Abnormal; Notable for the following components:   Troponin I (High Sensitivity) 36 (*)    All other components within normal limits  TROPONIN I (HIGH SENSITIVITY) - Abnormal; Notable for the following components:   Troponin I (High Sensitivity) 39 (*)    All other components within normal limits  POC SARS CORONAVIRUS 2 AG -  ED  CBG MONITORING, ED    EKG EKG Interpretation  Date/Time:  Saturday April 10 2019 12:35:32 EST Ventricular Rate:  74 PR Interval:    QRS Duration: 102 QT Interval:  424 QTC Calculation: 470 R Axis:   46 Text Interpretation: Atrial fibrillation with frequent ventricular-paced complexes Nonspecific ST and T wave  abnormality Abnormal ECG Confirmed by Milton Ferguson [phone]) on 04/10/2019 5:46:52 PM   Radiology Dg Chest 2 View  Result Date: 04/10/2019 CLINICAL DATA:  Dyspnea EXAM: CHEST - 2 VIEW COMPARISON:  11/05/2017 chest radiograph. FINDINGS: Stable configuration of 2 lead left subclavian pacemaker. Stable cardiomediastinal silhouette with top-normal heart size. No pneumothorax. No pleural effusion. Lungs appear clear, with no acute consolidative airspace disease and no pulmonary edema. IMPRESSION: No active cardiopulmonary disease. Electronically Signed   By: Ilona Sorrel M.D.   On: 04/10/2019 13:03    Procedures Procedures (including critical care time)  Medications Ordered in ED Medications  sodium chloride flush (NS) 0.9 % injection 3 mL (3 mLs Intravenous Not Given 04/10/19 1450)     Initial Impression / Assessment and Plan / ED Course  I have reviewed the triage vital signs and the nursing notes.  Pertinent labs & imaging results that were available during my care of the patient were reviewed by me and considered in my medical decision making (see chart for details).    CRITICAL CARE Performed by: Milton Ferguson Total critical care time:39minutes Critical care time was exclusive of separately billable procedures and treating other patients. Critical care was necessary to treat or prevent imminent or life-threatening deterioration. Critical care was time spent personally by me on the following activities: development of treatment plan with patient and/or surrogate as well as nursing, discussions with consultants, evaluation of patient's response to treatment, examination of patient, obtaining history from patient or surrogate, ordering and performing treatments and interventions, ordering and review of laboratory studies, ordering and review of radiographic studies, pulse oximetry and re-evaluation of patient's condition.     Patient with coronary artery disease COPD diabetes and  significant renal disease causing anasarca.  He will be admitted to medicine and dialysis will be arranged.  Nephrology has been contacted  Final Clinical Impressions(s) / ED Diagnoses   Final diagnoses:  Renal anasarca    ED Discharge Orders    None       Milton Ferguson, MD 04/10/19 1840    Milton Ferguson, MD 04/24/19 [phone]

## 2019-04-10 NOTE — ED Triage Notes (Signed)
Pt seen by PCP yesterday and told he was in renal failure and needed to start dialysis.  Pt states he wanted to hold off but is now having SOB and lower extremity edema.  Reports generalized body aches.

## 2019-04-11 ENCOUNTER — Encounter (HOSPITAL_COMMUNITY): Payer: Self-pay | Admitting: Interventional Radiology

## 2019-04-11 ENCOUNTER — Inpatient Hospital Stay (HOSPITAL_COMMUNITY): Payer: Medicare Other

## 2019-04-11 DIAGNOSIS — I495 Sick sinus syndrome: Secondary | ICD-10-CM

## 2019-04-11 DIAGNOSIS — I2510 Atherosclerotic heart disease of native coronary artery without angina pectoris: Secondary | ICD-10-CM

## 2019-04-11 DIAGNOSIS — Z89421 Acquired absence of other right toe(s): Secondary | ICD-10-CM

## 2019-04-11 DIAGNOSIS — E1140 Type 2 diabetes mellitus with diabetic neuropathy, unspecified: Secondary | ICD-10-CM

## 2019-04-11 DIAGNOSIS — K5900 Constipation, unspecified: Secondary | ICD-10-CM

## 2019-04-11 DIAGNOSIS — Z950 Presence of cardiac pacemaker: Secondary | ICD-10-CM

## 2019-04-11 DIAGNOSIS — Z955 Presence of coronary angioplasty implant and graft: Secondary | ICD-10-CM

## 2019-04-11 DIAGNOSIS — I132 Hypertensive heart and chronic kidney disease with heart failure and with stage 5 chronic kidney disease, or end stage renal disease: Principal | ICD-10-CM

## 2019-04-11 DIAGNOSIS — N186 End stage renal disease: Secondary | ICD-10-CM

## 2019-04-11 DIAGNOSIS — I4892 Unspecified atrial flutter: Secondary | ICD-10-CM

## 2019-04-11 DIAGNOSIS — J449 Chronic obstructive pulmonary disease, unspecified: Secondary | ICD-10-CM

## 2019-04-11 DIAGNOSIS — Z66 Do not resuscitate: Secondary | ICD-10-CM

## 2019-04-11 DIAGNOSIS — Z8719 Personal history of other diseases of the digestive system: Secondary | ICD-10-CM

## 2019-04-11 DIAGNOSIS — F17210 Nicotine dependence, cigarettes, uncomplicated: Secondary | ICD-10-CM

## 2019-04-11 DIAGNOSIS — Z79899 Other long term (current) drug therapy: Secondary | ICD-10-CM

## 2019-04-11 DIAGNOSIS — E1122 Type 2 diabetes mellitus with diabetic chronic kidney disease: Secondary | ICD-10-CM

## 2019-04-11 DIAGNOSIS — E8770 Fluid overload, unspecified: Secondary | ICD-10-CM

## 2019-04-11 DIAGNOSIS — I5030 Unspecified diastolic (congestive) heart failure: Secondary | ICD-10-CM

## 2019-04-11 DIAGNOSIS — E785 Hyperlipidemia, unspecified: Secondary | ICD-10-CM

## 2019-04-11 DIAGNOSIS — D631 Anemia in chronic kidney disease: Secondary | ICD-10-CM

## 2019-04-11 DIAGNOSIS — E1151 Type 2 diabetes mellitus with diabetic peripheral angiopathy without gangrene: Secondary | ICD-10-CM

## 2019-04-11 DIAGNOSIS — R918 Other nonspecific abnormal finding of lung field: Secondary | ICD-10-CM

## 2019-04-11 DIAGNOSIS — Z89422 Acquired absence of other left toe(s): Secondary | ICD-10-CM

## 2019-04-11 HISTORY — PX: IR FLUORO GUIDE CV LINE RIGHT: IMG2283

## 2019-04-11 HISTORY — PX: IR US GUIDE VASC ACCESS RIGHT: IMG2390

## 2019-04-11 LAB — CBC
HCT: 29.6 % — ABNORMAL LOW (ref 39.0–52.0)
HCT: 29.5 % — ABNORMAL LOW (ref 39.0–52.0)
Hemoglobin: 9.8 g/dL — ABNORMAL LOW (ref 13.0–17.0)
Hemoglobin: 9.6 g/dL — ABNORMAL LOW (ref 13.0–17.0)
MCH: 33.6 pg (ref 26.0–34.0)
MCH: 33 pg (ref 26.0–34.0)
MCHC: 33.1 g/dL (ref 30.0–36.0)
MCHC: 32.5 g/dL (ref 30.0–36.0)
MCV: 101.4 fL — ABNORMAL HIGH (ref 80.0–100.0)
MCV: 101.4 fL — ABNORMAL HIGH (ref 80.0–100.0)
Platelets: 169 10*3/uL (ref 150–400)
Platelets: 148 10*3/uL — ABNORMAL LOW (ref 150–400)
RBC: 2.92 MIL/uL — ABNORMAL LOW (ref 4.22–5.81)
RBC: 2.91 MIL/uL — ABNORMAL LOW (ref 4.22–5.81)
RDW: 15.5 % (ref 11.5–15.5)
RDW: 15.5 % (ref 11.5–15.5)
WBC: 5.4 10*3/uL (ref 4.0–10.5)
WBC: 6.5 10*3/uL (ref 4.0–10.5)
nRBC: 0 % (ref 0.0–0.2)
nRBC: 0 % (ref 0.0–0.2)

## 2019-04-11 LAB — RENAL FUNCTION PANEL
Albumin: 3.2 g/dL — ABNORMAL LOW (ref 3.5–5.0)
Albumin: 3.1 g/dL — ABNORMAL LOW (ref 3.5–5.0)
Anion gap: 13 (ref 5–15)
Anion gap: 9 (ref 5–15)
BUN: 60 mg/dL — ABNORMAL HIGH (ref 8–23)
BUN: 42 mg/dL — ABNORMAL HIGH (ref 8–23)
CO2: 22 mmol/L (ref 22–32)
CO2: 26 mmol/L (ref 22–32)
Calcium: 8.6 mg/dL — ABNORMAL LOW (ref 8.9–10.3)
Calcium: 8.4 mg/dL — ABNORMAL LOW (ref 8.9–10.3)
Chloride: 105 mmol/L (ref 98–111)
Chloride: 103 mmol/L (ref 98–111)
Creatinine, Ser: 5.74 mg/dL — ABNORMAL HIGH (ref 0.61–1.24)
Creatinine, Ser: 4.58 mg/dL — ABNORMAL HIGH (ref 0.61–1.24)
GFR calc Af Amer: 11 mL/min — ABNORMAL LOW (ref >60)
GFR calc Af Amer: 14 mL/min — ABNORMAL LOW (ref >60)
GFR calc non Af Amer: 9 mL/min — ABNORMAL LOW (ref >60)
GFR calc non Af Amer: 12 mL/min — ABNORMAL LOW (ref >60)
Glucose, Bld: 91 mg/dL (ref 70–99)
Glucose, Bld: 168 mg/dL — ABNORMAL HIGH (ref 70–99)
Phosphorus: 4.5 mg/dL (ref 2.5–4.6)
Phosphorus: 2.9 mg/dL (ref 2.5–4.6)
Potassium: 3.4 mmol/L — ABNORMAL LOW (ref 3.5–5.1)
Potassium: 3.7 mmol/L (ref 3.5–5.1)
Sodium: 140 mmol/L (ref 135–145)
Sodium: 138 mmol/L (ref 135–145)

## 2019-04-11 LAB — HEPATITIS B SURFACE ANTIGEN: Hepatitis B Surface Ag: NONREACTIVE

## 2019-04-11 LAB — PROTIME-INR
INR: 1.2 (ref 0.8–1.2)
Prothrombin Time: 14.7 seconds (ref 11.4–15.2)

## 2019-04-11 LAB — MAGNESIUM: Magnesium: 2 mg/dL (ref 1.7–2.4)

## 2019-04-11 LAB — HEPATITIS B CORE ANTIBODY, TOTAL: Hep B Core Total Ab: NONREACTIVE

## 2019-04-11 MED ORDER — POTASSIUM CHLORIDE 20 MEQ PO PACK
40.0000 meq | PACK | Freq: Once | ORAL | Status: DC
  Filled 2019-04-11: qty 2

## 2019-04-11 MED ORDER — HEPARIN SODIUM (PORCINE) 1000 UNIT/ML DIALYSIS
1000.0000 [IU] | INTRAMUSCULAR | Status: DC | PRN
  Administered 2019-04-11: 4200 [IU] via INTRAVENOUS_CENTRAL

## 2019-04-11 MED ORDER — FENTANYL CITRATE (PF) 100 MCG/2ML IJ SOLN
INTRAMUSCULAR | Status: AC | PRN
  Administered 2019-04-11 (×2): 25 ug via INTRAVENOUS

## 2019-04-11 MED ORDER — LIDOCAINE-PRILOCAINE 2.5-2.5 % EX CREA: 1.0000 "application " | TOPICAL_CREAM | CUTANEOUS | Status: DC | PRN

## 2019-04-11 MED ORDER — LIDOCAINE HCL (PF) 1 % IJ SOLN: 5.0000 mL | INTRAMUSCULAR | Status: DC | PRN

## 2019-04-11 MED ORDER — PENTAFLUOROPROP-TETRAFLUOROETH EX AERO: 1.0000 "application " | INHALATION_SPRAY | CUTANEOUS | Status: DC | PRN

## 2019-04-11 MED ORDER — GELATIN ABSORBABLE 12-7 MM EX MISC
CUTANEOUS | Status: AC
  Filled 2019-04-11: qty 1

## 2019-04-11 MED ORDER — CLONIDINE HCL 0.1 MG/24HR TD PTWK
0.1000 mg | MEDICATED_PATCH | TRANSDERMAL | Status: DC
  Administered 2019-04-11: 0.1 mg via TRANSDERMAL
  Filled 2019-04-11: qty 1

## 2019-04-11 MED ORDER — ALTEPLASE 2 MG IJ SOLR: 2.0000 mg | Freq: Once | INTRAMUSCULAR | Status: DC | PRN

## 2019-04-11 MED ORDER — LIDOCAINE HCL 1 % IJ SOLN
INTRAMUSCULAR | Status: AC | PRN
  Administered 2019-04-11: 5 mL

## 2019-04-11 MED ORDER — CHLORHEXIDINE GLUCONATE CLOTH 2 % EX PADS
6.0000 | MEDICATED_PAD | Freq: Every day | CUTANEOUS | Status: DC
  Administered 2019-04-12 – 2019-04-13 (×2): 6 via TOPICAL

## 2019-04-11 MED ORDER — FUROSEMIDE 10 MG/ML IJ SOLN
120.0000 mg | Freq: Once | INTRAVENOUS | Status: DC
  Filled 2019-04-11: qty 12

## 2019-04-11 MED ORDER — SODIUM CHLORIDE 0.9 % IV SOLN
INTRAVENOUS | Status: AC | PRN
  Administered 2019-04-11: 10 mL/h via INTRAVENOUS

## 2019-04-11 MED ORDER — HEPARIN SODIUM (PORCINE) 1000 UNIT/ML IJ SOLN
INTRAMUSCULAR | Status: AC
  Filled 2019-04-11: qty 6

## 2019-04-11 MED ORDER — FENTANYL CITRATE (PF) 100 MCG/2ML IJ SOLN
INTRAMUSCULAR | Status: AC
  Filled 2019-04-11: qty 2

## 2019-04-11 MED ORDER — SODIUM CHLORIDE 0.9 % IV SOLN
0.3000 ug/kg | Freq: Once | INTRAVENOUS | Status: DC
  Filled 2019-04-11: qty 8.3

## 2019-04-11 MED ORDER — HEPARIN SODIUM (PORCINE) 1000 UNIT/ML IJ SOLN
INTRAMUSCULAR | Status: AC
  Filled 2019-04-11: qty 1

## 2019-04-11 MED ORDER — GELATIN ABSORBABLE 12-7 MM EX MISC
CUTANEOUS | Status: AC | PRN
  Administered 2019-04-11: 1 via TOPICAL

## 2019-04-11 MED ORDER — RAMELTEON 8 MG PO TABS
8.0000 mg | ORAL_TABLET | Freq: Every day | ORAL | Status: DC
  Administered 2019-04-11 – 2019-04-12 (×2): 8 mg via ORAL
  Filled 2019-04-11 (×2): qty 1

## 2019-04-11 MED ORDER — MIDAZOLAM HCL 2 MG/2ML IJ SOLN
INTRAMUSCULAR | Status: AC | PRN
  Administered 2019-04-11: 1 mg via INTRAVENOUS

## 2019-04-11 MED ORDER — HEPARIN SODIUM (PORCINE) 1000 UNIT/ML IJ SOLN
INTRAMUSCULAR | Status: AC | PRN
  Administered 2019-04-11: 4200 [IU] via INTRAVENOUS

## 2019-04-11 MED ORDER — SODIUM CHLORIDE 0.9 % IV SOLN: 100.0000 mL | INTRAVENOUS | Status: DC | PRN

## 2019-04-11 MED ORDER — POTASSIUM CHLORIDE CRYS ER 10 MEQ PO TBCR
EXTENDED_RELEASE_TABLET | ORAL | Status: AC
  Administered 2019-04-11: 40 meq
  Filled 2019-04-11: qty 4

## 2019-04-11 MED ORDER — SODIUM CHLORIDE 0.9 % IV SOLN
0.3000 ug/kg | Freq: Once | INTRAVENOUS | Status: AC
  Administered 2019-04-11: 33.2 ug via INTRAVENOUS
  Filled 2019-04-11: qty 8.3

## 2019-04-11 MED ORDER — CEFAZOLIN SODIUM-DEXTROSE 2-4 GM/100ML-% IV SOLN
INTRAVENOUS | Status: AC
  Filled 2019-04-11: qty 100

## 2019-04-11 MED ORDER — CEFAZOLIN SODIUM-DEXTROSE 2-4 GM/100ML-% IV SOLN
2.0000 g | Freq: Once | INTRAVENOUS | Status: AC
  Administered 2019-04-11: 2 g via INTRAVENOUS
  Filled 2019-04-11: qty 100

## 2019-04-11 MED ORDER — MIDAZOLAM HCL 2 MG/2ML IJ SOLN
INTRAMUSCULAR | Status: AC
  Filled 2019-04-11: qty 2

## 2019-04-11 MED ORDER — LIDOCAINE HCL 1 % IJ SOLN
INTRAMUSCULAR | Status: AC
  Filled 2019-04-11: qty 20

## 2019-04-11 NOTE — Progress Notes (Signed)
NEW ADMISSION NOTE New Admission Note:   Arrival Method. [place] bed Mental Orientation: Alert and oriented x4 Telemetry: Called and confirmed Assessment: Completed Skin:Intact IV: Right forearm Pain: Not complaining. Tubes: N/A Safety Measures: Safety Fall Prevention Plan has been given, discussed and signed Admission: Completed [facility] Orientation: Patient has been orientated to the room, unit and staff.  Family:None at the bedside.  Orders have been reviewed and implemented. Will continue to monitor the patient. Call light has been placed within reach and bed alarm has been activated.   Shortly after arrival on the floor ,he went to I.R. and went to hemodialysis unit. [place], Zenon Mayo, RN

## 2019-04-11 NOTE — ED Notes (Signed)
Pt asleep.

## 2019-04-11 NOTE — Progress Notes (Signed)
   Subjective: No acute events overnight. Breathing is doing better. Still endorses orthopnea. Denies headaches, chest pain, nausea, vomiting, abdominal pain.   Objective:  Vital signs in last 24 hours: Vitals:   04/11/19 0815 04/11/19 0900 04/11/19 0930 04/11/19 1000  BP: (!) 179/78 (!) 199/91 (!) 171/80 (!) 190/86  Pulse: 66 62 64 65  Resp: (!) 23 15 17  (!) 23  Temp:      TempSrc:      SpO2: 93% 98% 93% 97%   General: awake, alert, sitting up in bed in NAD Neck: + JVD CV: RRR; 2/6 SEMD Pulm: normal work of breathing on room air; coarse breath sounds with expiratory wheezes Ext: 2+ LE edema bilaterally   Assessment/Plan:  Active Problems:   ESRD (end stage renal disease) (HCC)  Patient is a 68 year old male with past medical history of CKD stage V, HFpEF, hypertension, COPD, atrial flutter, coronary artery disease who presents with volume overload for HD initiation.  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 Gets epoetin alfa 4000 U every 2 weeks, last given 11/18  HFpEF:  - overload symptoms seem more likely due to progression of CKD V to ESRD requiring HD - should improve with HD   Hypertension:  - uncontrolled with systolic BP increasing to 190s-200s; he is asymptomatic  - start clonidine 0.1 mg patch; can likely d/c after volume status improves with HD - continue home Coreg, Cardizem and Hydralazine   Dispo: Anticipated discharge pending further medical treatment and will need outpatient HD placement.   Modena Nunnery D, DO 04/11/2019, 11:03 AM Pager: [phone]

## 2019-04-11 NOTE — ED Notes (Signed)
Admitting providers at patient bedside.

## 2019-04-11 NOTE — Consult Note (Signed)
Chief Complaint: Patient was seen in consultation today for renal failure  Referring Physician(s): Dr. Hollie Salk  Supervising Physician: Daryll Brod  Patient Status: [facility] - In-pt  History of Present Illness: [name] is a 68 y.o. male with past medical history of CAD, COPD, DM2, a flutter, PVD s/p bilateral 2nd toe amputations, HFpEF, and CKD Stage 5 who presents to [facility] ED with shortness of breath in need of dialysis initiation.  Patient underwent right arm fistula creation in 02/2018, however this failed.  He now has a left wrist AVF that is not yet mature. Request made for tunneled dialysis catheter placement at the request of Dr. Hollie Salk.   Patient assessed at bedside.  His left forearm/wrist AVF is well healed with good thrill. States he has not had a chest catheter before.  Agreeable to proceed.  Reports his Eliquis has been held for several months due to a GI bleed.   He is NPO.  SQ heparin given at 3AM.   Past Medical History:  Diagnosis Date  . AAA (abdominal aortic aneurysm) ([place]) 06/2016   Mild increase in size of 3.4 cm infrarenal abdominal aortic aneurysm  . Anemia   . Aortic atherosclerosis ([place])   . Asthma   . Atrial flutter ([place])   . AVF (arteriovenous fistula) ([facility])    Left forearm  . CAD (coronary artery disease)    a. s/p prior stenting of RCA in 1999 b. cath in 2003 showing moderate CAD c. NST in 2016 showing small area of ischemic and low-risk  . CHF (congestive heart failure) ([place])   . CKD (chronic kidney disease), stage V ([place])    kidney transplant evaluation  . COPD (chronic obstructive pulmonary disease) ([place])    with ongoing tobacco use and patient failed sham takes  . Diverticulosis 2018   Mild sigmoid colon diverticulosis.  Marland Kitchen DM2 (diabetes mellitus, type 2) ([place])   . Dyslipidemia   . Edema    chronic lower extremity secondary to right heart failure and chronic venous insufficiency  . Fatigue   . Fatty liver 2010   Mild  . Headache   . HTN  (hypertension)   . Morbid obesity ([place])   . Multiple pulmonary nodules 05/2018   Bilateral  . Pneumonia   . Presence of permanent cardiac pacemaker   . 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   Marland Kitchen Reflux esophagitis    hx  . Sleep apnea    PT STATES ONE TEST WAS POSITIVE AND ANOTHER TEST WAS NEGATIVE  . Two-vessel coronary artery disease    moderate. by cath in 2003. Status post stenting of the mdi RCA November 1999 normal left ventricular ejection fraction  . Wears glasses   . Wears hearing aid    B/L    Past Surgical History:  Procedure Laterality Date  . ABDOMINAL SURGERY    . APPENDECTOMY    . AV FISTULA PLACEMENT Right 03/11/2018   Procedure: CREATION Brachiocephalic Fistula RIGHT ARM;  Surgeon: Rosetta Posner, MD;  Location: [facility];  Service: Vascular;  Laterality: Right;  . BACK SURGERY     x3  . BIOPSY  11/12/2018   Procedure: BIOPSY;  Surgeon: Laurence Spates, MD;  Location: [facility];  Service: Endoscopy;;  . CARDIAC CATHETERIZATION     stent  . CHOLECYSTECTOMY    . CLOSED REDUCTION SHOULDER DISLOCATION    . COLONOSCOPY W/ BIOPSIES AND POLYPECTOMY    . COLONOSCOPY  WITH PROPOFOL N/A 11/12/2018   Procedure: COLONOSCOPY WITH PROPOFOL;  Surgeon: Laurence Spates, MD;  Location: [facility];  Service: Endoscopy;  Laterality: N/A;  . diabetic ulcers    . ESOPHAGOGASTRODUODENOSCOPY (EGD) WITH PROPOFOL N/A 11/12/2018   Procedure: ESOPHAGOGASTRODUODENOSCOPY (EGD) WITH PROPOFOL;  Surgeon: Laurence Spates, MD;  Location: [facility];  Service: Endoscopy;  Laterality: N/A;  . GROIN EXPLORATION    . HEMOSTASIS CLIP PLACEMENT  11/12/2018   Procedure: HEMOSTASIS CLIP PLACEMENT;  Surgeon: Laurence Spates, MD;  Location: [facility];  Service: Endoscopy;;  . INSERTION OF ARTERIOVENOUS (AV) ARTEGRAFT ARM Left 03/18/2018   Procedure: INSERTION OF ARTERIOVENOUS (AV) FISTULA;  Surgeon: Rosetta Posner, MD;  Location: [facility];  Service:  Vascular;  Laterality: Left;  . LIGATION ARTERIOVENOUS GORTEX GRAFT Right 03/18/2018   Procedure: LIGATION ARTERIOVENOUS FISTULA;  Surgeon: Rosetta Posner, MD;  Location: [facility];  Service: Vascular;  Laterality: Right;  . OSTECTOMY Left 04/23/2017   Procedure: OSTECTOMY LEFT GREAT TOE;  Surgeon: [name], DPM;  Location: [facility];  Service: Podiatry;  Laterality: Left;  left great toe  . POLYPECTOMY  11/12/2018   Procedure: POLYPECTOMY;  Surgeon: Laurence Spates, MD;  Location: [facility];  Service: Endoscopy;;  . TUMOR REMOVAL     small intestine x3    Allergies: Patient has no known allergies.  Medications: Prior to Admission medications   Medication Sig Start Date End Date Taking? Authorizing Provider  acetaminophen (TYLENOL) 325 MG tablet Take 975 mg by mouth every morning.    Yes [provider]  albuterol (VENTOLIN HFA) 108 (90 BASE) MCG/ACT inhaler Inhale 2 puffs into the lungs every 6 (six) hours as needed for wheezing or shortness of breath.    Yes [provider]  atorvastatin (LIPITOR) 40 MG tablet Take 1 tablet (40 mg total) by mouth every evening. Patient taking differently: Take 40 mg by mouth every morning.  08/24/18  Yes BranchAlphonse Guild, MD  beclomethasone (QVAR) 80 MCG/ACT inhaler Inhale 2 puffs into the lungs 2 (two) times daily as needed (shortness of breath).   Yes [provider]  carvedilol (COREG) 25 MG tablet TAKE ONE TABLET BY MOUTH TWICE DAILY. Patient taking differently: Take 25 mg by mouth 2 (two) times daily with a meal.  02/15/19  Yes Branch, Alphonse Guild, MD  Cholecalciferol (VITAMIN D3) 2000 units TABS Take 2,000 Units by mouth every morning.    Yes [provider]  diltiazem (CARDIZEM) 30 MG tablet TAKE ONE TABLET BY MOUTH TWICE DAILY. Patient taking differently: Take 30 mg by mouth 2 (two) times daily.  02/15/19  Yes BranchAlphonse Guild, MD  epoetin alfa (EPOGEN) 4000 UNIT/ML injection Inject 4,000 Units into the vein  See admin instructions. Infusion done as needed for hematocrit <10 09/08/18  Yes Befekadu, Eli Phillips, MD  hydrALAZINE (APRESOLINE) 100 MG tablet TAKE ONE TABLET BY MOUTH THREE TIMES DAILY Patient taking differently: Take 100-200 mg by mouth See admin instructions. Take two tablets (200 mg) by mouth every morning and one tablet (100 mg) at night 02/15/19  Yes Branch, Alphonse Guild, MD  insulin glargine (LANTUS) 100 unit/mL SOPN Inject 35 Units into the skin at bedtime as needed (CBG >150).   Yes [provider]  iron polysaccharides (NIFEREX) 150 MG capsule Take 150 mg by mouth every morning.   Yes [provider]  linaclotide (LINZESS) 290 MCG CAPS capsule Take 290 mcg by mouth daily as needed (constipation).   Yes [provider]  Melatonin 10 MG  TABS Take 10 mg by mouth at bedtime as needed (sleep).    Yes [provider]  Multiple Vitamin (MULTIVITAMIN WITH MINERALS) TABS tablet Take 1 tablet by mouth every morning.    Yes [provider]  omeprazole (PRILOSEC) 20 MG capsule Take 20 mg by mouth every morning.    Yes [provider]  polyethylene glycol (MIRALAX / GLYCOLAX) packet Take 17 g by mouth every morning. Mix in morning coffee   Yes [provider]  potassium chloride SA (K-DUR,KLOR-CON) 20 MEQ tablet Take 1 tablet (20 mEq total) by mouth daily. Patient taking differently: Take 20 mEq by mouth every morning.  05/10/17  Yes Tat, Shanon Brow, MD  Sennosides 25 MG TABS Take 50 mg by mouth at bedtime.   Yes [provider]  sildenafil (VIAGRA) 100 MG tablet Take 1 tablet (100 mg total) by mouth as needed for erectile dysfunction. Patient taking differently: Take 100 mg by mouth daily as needed for erectile dysfunction.  01/23/18  Yes Allred, Jeneen Rinks, MD  silver sulfADIAZINE (SILVADENE) 1 % cream Apply 1 application topically daily as needed (For wound care with dressing).    Yes [provider]  torsemide (DEMADEX) 100 MG tablet  Take 100 mg by mouth 2 (two) times daily.  10/06/17  Yes [provider]  oxyCODONE-acetaminophen (PERCOCET) 5-325 MG tablet Take 1 tablet by mouth every 6 (six) hours as needed for severe pain. Patient not taking: Reported on 04/10/2019 03/19/18   Gabriel Earing, PA-C     Family History  Problem Relation Age of Onset  . Diabetes Mother   . Alcohol abuse Father     Social History   Socioeconomic History  . Marital status: Married    Spouse name: Not on file  . Number of children: Not on file  . Years of education: Not on file  . Highest education level: Not on file  Occupational History  . Not on file  Social Needs  . Financial resource strain: Not on file  . Food insecurity    Worry: Not on file    Inability: Not on file  . Transportation needs    Medical: Not on file    Non-medical: Not on file  Tobacco Use  . Smoking status: Current Every Day Smoker    Packs/day: 1.00    Years: 40.00    Pack years: 40.00    Types: Cigarettes    Start date: 05/20/1968  . Smokeless tobacco: Never Used  Substance and Sexual Activity  . Alcohol use: Not Currently    Alcohol/week: 0.0 standard drinks    Comment: rare  . Drug use: No  . Sexual activity: Yes  Lifestyle  . Physical activity    Days per week: Not on file    Minutes per session: Not on file  . Stress: Not on file  Relationships  . Social Herbalist on phone: Not on file    Gets together: Not on file    Attends religious service: Not on file    Active member of club or organization: Not on file    Attends meetings of clubs or organizations: Not on file    Relationship status: Not on file  Other Topics Concern  . Not on file  Social History Narrative   Patient continues to smoke.      Review of Systems: A 12 point ROS discussed and pertinent positives are indicated in the HPI above.  All other systems are negative.  Review of Systems  Constitutional: Negative for fatigue and fever.   Respiratory: Positive for shortness of breath. Negative for cough.   Cardiovascular: Negative for chest pain.  Gastrointestinal: Negative for abdominal pain, diarrhea, nausea and vomiting.  Musculoskeletal: Negative for back pain.  Psychiatric/Behavioral: Negative for behavioral problems and confusion.    Vital Signs: BP (!) 151/68 (BP Location: Right Arm)   Pulse 77   Temp 98.2 F (36.8 C) (Oral)   Resp 18   SpO2 97%   Physical Exam Vitals signs and nursing note reviewed.  Constitutional:      General: He is not in acute distress.    Appearance: He is not ill-appearing.  HENT:     Mouth/Throat:     Mouth: Mucous membranes are moist.     Pharynx: Oropharynx is clear.  Neck:     Musculoskeletal: Normal range of motion and neck supple.  Cardiovascular:     Rate and Rhythm: Normal rate. Rhythm irregular.  Pulmonary:     Effort: Pulmonary effort is normal.     Breath sounds: Normal breath sounds.  Abdominal:     General: Abdomen is flat.     Palpations: Abdomen is soft.  Musculoskeletal: Normal range of motion.     Comments: L wrist AVF with good thrill  Skin:    General: Skin is warm and dry.  Neurological:     General: No focal deficit present.     Mental Status: He is alert and oriented to person, place, and time. Mental status is at baseline.  Psychiatric:        Mood and Affect: Mood normal.        Behavior: Behavior normal.        Thought Content: Thought content normal.        Judgment: Judgment normal.      MD Evaluation Airway: WNL Heart: WNL Abdomen: WNL Chest/ Lungs: WNL ASA  Classification: 3 Mallampati/Airway Score: One   Imaging: Dg Chest 2 View  Result Date: 04/10/2019 CLINICAL DATA:  Dyspnea EXAM: CHEST - 2 VIEW COMPARISON:  11/05/2017 chest radiograph. FINDINGS: Stable configuration of 2 lead left subclavian pacemaker. Stable cardiomediastinal silhouette with top-normal heart size. No pneumothorax. No pleural effusion. Lungs appear clear,  with no acute consolidative airspace disease and no pulmonary edema. IMPRESSION: No active cardiopulmonary disease. Electronically Signed   By: Ilona Sorrel M.D.   On: 04/10/2019 13:03    Labs:  CBC: Recent Labs    10/14/18 1456  04/09/19 0951 04/09/19 1003 04/10/19 1244 04/10/19 2034  WBC 6.5  --  6.7  --  6.1  5.9 6.0  HGB 8.8*   < > 10.1* 10.9* 10.7*  10.3* 9.9*  HCT 28.7*   < > 31.9* 32.0* 32.4*  32.1* 30.4*  PLT 208  --  177  --  194  189 167   < > = values in this interval not displayed.    COAGS: Recent Labs    04/11/19 0907  INR 1.2    BMP: Recent Labs    01/05/19 1151 01/19/19 1213 02/23/19 1113 04/09/19 0951 04/09/19 1003 04/10/19 1244 04/10/19 2034  NA 137 138 139  --  140 140  --   K 3.7 4.0 4.4  --  3.7 3.4*  --   CL 103 102 103  --  107 105  --   CO2 20* 24 26  --   --  22  --   GLUCOSE 112* 93 102*  --  97  105*  --   BUN 69* 76* 70*  --  60* 63*  --   CALCIUM 8.9 8.9  9.2 8.6*  9.0 8.3*  --  8.4*  --   CREATININE 4.50* 5.68* 5.31*  --  6.30* 6.33* 6.06*  GFRNONAA 13* 10* 10*  --   --  8* 9*  GFRAA 15* 11* 12*  --   --  10* 10*    LIVER FUNCTION TESTS: Recent Labs    10/14/18 1456  01/05/19 1151 01/19/19 1213 02/23/19 1113 04/10/19 1244  BILITOT 0.6  --   --   --   --  0.3  AST 16  --   --   --   --  19  ALT 15  --   --   --   --  18  ALKPHOS 60  --   --   --   --  59  PROT 6.6  --   --   --   --  7.0  ALBUMIN 3.4*   < > 3.5 3.6 3.4* 3.4*   < > = values in this interval not displayed.    TUMOR MARKERS: No results for input(s): AFPTM, CEA, CA199, CHROMGRNA in the last 8760 hours.  Assessment and Plan: Renal failure Shortness of breath, fluid overload Patient in need of dialysis initiation.  He has a L forearm/wrist AVF not yet mature.  Request made for tunneled dialysis catheter.  Received SQ heparin at 3AM.  NPO overnight.  INR 1.2  Patient agreeable to catheter placement.   Risks and benefits discussed with the  patient including, but not limited to bleeding, infection, vascular injury, pneumothorax which may require chest tube placement, air embolism or even death  All of the patient's questions were answered, patient is agreeable to proceed. Consent signed and in chart.  Thank you for this interesting consult.  I greatly enjoyed meeting [name] and look forward to participating in their care.  A copy of this report was sent to the requesting provider on this date.  Electronically Signed: Docia Barrier, PA 04/11/2019, 10:04 AM   I spent a total of 40 Minutes    in face to face in clinical consultation, greater than 50% of which was counseling/coordinating care for renal failure.

## 2019-04-11 NOTE — Procedures (Signed)
ESRD, no current access  S/p RT IJ TUNNELED HD CATH  TIP SVCRA NO COMP STABLE EBL MIN READY FOR USE FULL REPORT IN PACS

## 2019-04-11 NOTE — Progress Notes (Deleted)
NEW ADMISSION NOTE New Admission Note:   Arrival Method: E.D. stretcher bed Mental Orientation: Alert and oriented x 4 Telemetry:Non ordered Assessment: Completed Skin:He has an extensive MASD on his bliateral inner thighs,bilateral peri-inguinal ,scrotal ,penis.He has an old healed surgical puncture area at the right lower quadrant which is having MASD,including the right abdominal folds.His right upper inner arm has MASD.At the left middle buttocks there is a shear dry wound  4 cm x 2cm. IV: Right hand Pain:Not complaining. Tubes:Indwelling foley catheter. Safety Measures: Safety Fall Prevention Plan has been given, discussed and signed Admission: Completed [facility] Orientation: Patient has been orientated to the room, unit and staff.  Family:None at the bedside.  Orders have been reviewed and implemented. Will continue to monitor the patient. Call light has been placed within reach and bed alarm has been activated.   Punta Rassa, Zenon Mayo, RN

## 2019-04-11 NOTE — ED Notes (Signed)
ED TO INPATIENT HANDOFF REPORT  ED Nurse Name and Phone #: Percell Locus, RN  S Name/Age/Gender [name] 68 y.o. male Room/Bed: 041C/041C  Code Status   Code Status: DNR  Home/SNF/Other Home Patient oriented to: self, place, time and situation Is this baseline? Yes   Triage Complete: Triage complete  Chief Complaint kidney issues  Triage Note Pt seen by PCP yesterday and told he was in renal failure and needed to start dialysis.  Pt states he wanted to hold off but is now having SOB and lower extremity edema.  Reports generalized body aches.    Allergies No Known Allergies  Level of Care/Admitting Diagnosis ED Disposition    ED Disposition Condition Comment   Admit  Hospital Area: [facility] [[id number]]  Level of Care: Med-Surg [16]  Covid Evaluation: Confirmed COVID Negative  Date Laboratory Confirmed COVID Negative: 04/10/2019  Diagnosis: ESRD (end stage renal disease) [facility]) [[id number]]  Admitting Physician: Aldine Contes [phone]  Attending Physician: Aldine Contes [phone]  Estimated length of stay: past midnight tomorrow  Certification:: I certify this patient will need inpatient services for at least 2 midnights  PT Class (Do Not Modify): Inpatient [101]  PT Acc Code (Do Not Modify): Private [1]       B Medical/Surgery History Past Medical History:  Diagnosis Date  . AAA (abdominal aortic aneurysm) ([place]) 06/2016   Mild increase in size of 3.4 cm infrarenal abdominal aortic aneurysm  . Anemia   . Aortic atherosclerosis ([place])   . Asthma   . Atrial flutter ([place])   . AVF (arteriovenous fistula) ([facility])    Left forearm  . CAD (coronary artery disease)    a. s/p prior stenting of RCA in 1999 b. cath in 2003 showing moderate CAD c. NST in 2016 showing small area of ischemic and low-risk  . CHF (congestive heart failure) ([place])   . CKD (chronic kidney disease), stage V ([place])    kidney transplant evaluation  . COPD (chronic obstructive  pulmonary disease) ([place])    with ongoing tobacco use and patient failed sham takes  . Diverticulosis 2018   Mild sigmoid colon diverticulosis.  Marland Kitchen DM2 (diabetes mellitus, type 2) ([place])   . Dyslipidemia   . Edema    chronic lower extremity secondary to right heart failure and chronic venous insufficiency  . Fatigue   . Fatty liver 2010   Mild  . Headache   . HTN (hypertension)   . Morbid obesity ([place])   . Multiple pulmonary nodules 05/2018   Bilateral  . Pneumonia   . Presence of permanent cardiac pacemaker   . 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   Marland Kitchen Reflux esophagitis    hx  . Sleep apnea    PT STATES ONE TEST WAS POSITIVE AND ANOTHER TEST WAS NEGATIVE  . Two-vessel coronary artery disease    moderate. by cath in 2003. Status post stenting of the mdi RCA November 1999 normal left ventricular ejection fraction  . Wears glasses   . Wears hearing aid    B/L   Past Surgical History:  Procedure Laterality Date  . ABDOMINAL SURGERY    . APPENDECTOMY    . AV FISTULA PLACEMENT Right 03/11/2018   Procedure: CREATION Brachiocephalic Fistula RIGHT ARM;  Surgeon: Rosetta Posner, MD;  Location: [facility];  Service: Vascular;  Laterality: Right;  . BACK SURGERY     x3  . BIOPSY  11/12/2018  Procedure: BIOPSY;  Surgeon: Laurence Spates, MD;  [facility];  Service: Endoscopy;;  . CARDIAC CATHETERIZATION     stent  . CHOLECYSTECTOMY    . CLOSED REDUCTION SHOULDER DISLOCATION    . COLONOSCOPY W/ BIOPSIES AND POLYPECTOMY    . COLONOSCOPY WITH PROPOFOL N/A 11/12/2018   Procedure: COLONOSCOPY WITH PROPOFOL;  Surgeon: Laurence Spates, MD;  [facility];  Service: Endoscopy;  Laterality: N/A;  . diabetic ulcers    . ESOPHAGOGASTRODUODENOSCOPY (EGD) WITH PROPOFOL N/A 11/12/2018   Procedure: ESOPHAGOGASTRODUODENOSCOPY (EGD) WITH PROPOFOL;  Surgeon: Laurence Spates, MD;  [facility];  Service: Endoscopy;   Laterality: N/A;  . GROIN EXPLORATION    . HEMOSTASIS CLIP PLACEMENT  11/12/2018   Procedure: HEMOSTASIS CLIP PLACEMENT;  Surgeon: Laurence Spates, MD;  [facility];  Service: Endoscopy;;  . INSERTION OF ARTERIOVENOUS (AV) ARTEGRAFT ARM Left 03/18/2018   Procedure: INSERTION OF ARTERIOVENOUS (AV) FISTULA;  Surgeon: Rosetta Posner, MD;  Location: [facility];  Service: Vascular;  Laterality: Left;  . LIGATION ARTERIOVENOUS GORTEX GRAFT Right 03/18/2018   Procedure: LIGATION ARTERIOVENOUS FISTULA;  Surgeon: Rosetta Posner, MD;  Location: [facility];  Service: Vascular;  Laterality: Right;  . OSTECTOMY Left 04/23/2017   Procedure: OSTECTOMY LEFT GREAT TOE;  Surgeon: [name], DPM;  Location: [facility];  Service: Podiatry;  Laterality: Left;  left great toe  . POLYPECTOMY  11/12/2018   Procedure: POLYPECTOMY;  Surgeon: Laurence Spates, MD;  [facility];  Service: Endoscopy;;  . TUMOR REMOVAL     small intestine x3     A IV Location/Drains/Wounds Patient Lines/Drains/Airways Status   Active Line/Drains/Airways    Name:   Placement date:   Placement time:   Site:   Days:   Peripheral IV 04/10/19 Right;Anterior Forearm   04/10/19    1813    Forearm   1   Fistula / Graft Left Forearm Arteriovenous fistula   03/18/18    2115    Forearm   389   Incision (Closed) 04/23/17 Foot Left   04/23/17    0806     718   Incision (Closed) 03/11/18 Arm Right   03/11/18    0758     396   Incision (Closed) 03/18/18 Arm Right   03/18/18    2048     389   Incision (Closed) 03/18/18 Arm Left   03/18/18    2048     389   Wound / Incision (Open or Dehisced) 04/05/16 Diabetic ulcer Foot Right   04/05/16    0000    Foot   1101   Wound / Incision (Open or Dehisced) 04/05/16 Diabetic ulcer Foot Left   04/05/16    0000    Foot   1101   Wound / Incision (Open or Dehisced) 04/05/16 Diabetic ulcer Toe (Comment  which one) Left   04/05/16    0000    Toe (Comment  which one)   1101          Intake/Output Last  24 hours  Intake/Output Summary (Last 24 hours) at 04/11/2019 1023 Last data filed at 04/10/2019 2158 Gross per 24 hour  Intake -  Output 900 ml  Net -900 ml    Labs/Imaging Results for orders placed or performed during the hospital encounter of 04/10/19 (from the past 48 hour(s))  Basic metabolic panel     Status: Abnormal   Collection Time: 04/10/19 12:44 PM  Result Value Ref Range   Sodium 140  135 - 145 mmol/L   Potassium 3.4 (L) 3.5 - 5.1 mmol/L   Chloride 105 98 - 111 mmol/L   CO2 22 22 - 32 mmol/L   Glucose, Bld 105 (H) 70 - 99 mg/dL   BUN 63 (H) 8 - 23 mg/dL   Creatinine, Ser 6.33 (H) 0.61 - 1.24 mg/dL   Calcium 8.4 (L) 8.9 - 10.3 mg/dL   GFR calc non Af Amer 8 (L) >60 mL/min   GFR calc Af Amer 10 (L) >60 mL/min   Anion gap 13 5 - 15    Comment: Performed at [facility] [address] [place], [place] [address]  CBC     Status: Abnormal   Collection Time: 04/10/19 12:44 PM  Result Value Ref Range   WBC 5.9 4.0 - 10.5 K/uL   RBC 3.08 (L) 4.22 - 5.81 MIL/uL   Hemoglobin 10.3 (L) 13.0 - 17.0 g/dL   HCT 32.1 (L) 39.0 - 52.0 %   MCV 104.2 (H) 80.0 - 100.0 fL   MCH 33.4 26.0 - 34.0 pg   MCHC 32.1 30.0 - 36.0 g/dL   RDW 15.9 (H) 11.5 - 15.5 %   Platelets 189 150 - 400 K/uL   nRBC 0.0 0.0 - 0.2 %    Comment: Performed at [facility], [place] [address], [place], [place] [address]  Hepatic function panel     Status: Abnormal   Collection Time: 04/10/19 12:44 PM  Result Value Ref Range   Total Protein 7.0 6.5 - 8.1 g/dL   Albumin 3.4 (L) 3.5 - 5.0 g/dL   AST 19 15 - 41 U/L   ALT 18 0 - 44 U/L   Alkaline Phosphatase 59 38 - 126 U/L   Total Bilirubin 0.3 0.3 - 1.2 mg/dL   Bilirubin, Direct 0.2 0.0 - 0.2 mg/dL   Indirect Bilirubin 0.1 (L) 0.3 - 0.9 mg/dL    Comment: Performed at [facility] [address] [place], [place] [address]  Troponin I (High Sensitivity)     Status: Abnormal   Collection Time: 04/10/19 12:44 PM  Result Value Ref Range    Troponin I (High Sensitivity) 36 (H) <18 ng/L    Comment: (NOTE) Elevated high sensitivity troponin I (hsTnI) values and significant  changes across serial measurements may suggest ACS but many other  chronic and acute conditions are known to elevate hsTnI results.  Refer to the "Links" section for chest pain algorithms and additional  guidance. Performed at [facility], [place] [address] [place], [place] [address]   Brain natriuretic peptide     Status: Abnormal   Collection Time: 04/10/19 12:44 PM  Result Value Ref Range   B Natriuretic Peptide 1,349.3 (H) 0.0 - 100.0 pg/mL    Comment: Performed at [facility] [address] [place], [place] [address]  CBC with Differential/Platelet     Status: Abnormal   Collection Time: 04/10/19 12:44 PM  Result Value Ref Range   WBC 6.1 4.0 - 10.5 K/uL   RBC 3.12 (L) 4.22 - 5.81 MIL/uL   Hemoglobin 10.7 (L) 13.0 - 17.0 g/dL   HCT 32.4 (L) 39.0 - 52.0 %   MCV 103.8 (H) 80.0 - 100.0 fL   MCH 34.3 (H) 26.0 - 34.0 pg   MCHC 33.0 30.0 - 36.0 g/dL   RDW 15.9 (H) 11.5 - 15.5 %   Platelets 194 150 - 400 K/uL   nRBC 0.0 0.0 - 0.2 %  Neutrophils Relative % 70 %   Neutro Abs 4.2 1.7 - 7.7 K/uL   Lymphocytes Relative 16 %   Lymphs Abs 1.0 0.7 - 4.0 K/uL   Monocytes Relative 11 %   Monocytes Absolute 0.7 0.1 - 1.0 K/uL   Eosinophils Relative 2 %   Eosinophils Absolute 0.1 0.0 - 0.5 K/uL   Basophils Relative 1 %   Basophils Absolute 0.1 0.0 - 0.1 K/uL   Immature Granulocytes 0 %   Abs Immature Granulocytes 0.02 0.00 - 0.07 K/uL    Comment: Performed at [facility] [address] [place], [place] [address]  POC SARS Coronavirus 2 Ag-ED - Nasal Swab (BD Veritor Kit)     Status: None   Collection Time: 04/10/19  2:04 PM  Result Value Ref Range   SARS Coronavirus 2 Ag NEGATIVE NEGATIVE    Comment: (NOTE) SARS-CoV-2 antigen NOT DETECTED.  Negative results are presumptive.  Negative results do not preclude SARS-CoV-2 infection  and should not be used as the sole basis for treatment or other patient management decisions, including infection  control decisions, particularly in the presence of clinical signs and  symptoms consistent with COVID-19, or in those who have been in contact with the virus.  Negative results must be combined with clinical observations, patient history, and epidemiological information. The expected result is Negative. Fact Sheet for Patients: PodPark.tn Fact Sheet for Healthcare Providers: GiftContent.is This test is not yet approved or cleared by the [place] FDA and  has been authorized for detection and/or diagnosis of SARS-CoV-2 by FDA under an Emergency Use Authorization (EUA).  This EUA will remain in effect (meaning this test can be used) for the duration of  the COVID-19 de claration under Section 564(b)(1) of the Act, 21 U.S.C. section 360bbb-3(b)(1), unless the authorization is terminated or revoked sooner.   Troponin I (High Sensitivity)     Status: Abnormal   Collection Time: 04/10/19  3:07 PM  Result Value Ref Range   Troponin I (High Sensitivity) 39 (H) <18 ng/L    Comment: (NOTE) Elevated high sensitivity troponin I (hsTnI) values and significant  changes across serial measurements may suggest ACS but many other  chronic and acute conditions are known to elevate hsTnI results.  Refer to the "Links" section for chest pain algorithms and additional  guidance. Performed at [facility], [place] [address] [place], [place] [address]   CBG monitoring, ED     Status: None   Collection Time: 04/10/19  3:12 PM  Result Value Ref Range   Glucose-Capillary 92 70 - 99 mg/dL  CBC     Status: Abnormal   Collection Time: 04/10/19  8:34 PM  Result Value Ref Range   WBC 6.0 4.0 - 10.5 K/uL   RBC 2.95 (L) 4.22 - 5.81 MIL/uL   Hemoglobin 9.9 (L) 13.0 - 17.0 g/dL   HCT 30.4 (L) 39.0 - 52.0 %   MCV 103.1 (H) 80.0 -  100.0 fL   MCH 33.6 26.0 - 34.0 pg   MCHC 32.6 30.0 - 36.0 g/dL   RDW 15.8 (H) 11.5 - 15.5 %   Platelets 167 150 - 400 K/uL   nRBC 0.0 0.0 - 0.2 %    Comment: Performed at [facility], [place] [address], [place], [place] [address]  Creatinine, serum     Status: Abnormal   Collection Time: 04/10/19  8:34 PM  Result Value Ref Range   Creatinine, Ser 6.06 (H) 0.61 -  1.24 mg/dL   GFR calc non Af Amer 9 (L) >60 mL/min   GFR calc Af Amer 10 (L) >60 mL/min    Comment: Performed at [facility] [address], [place], [place] [address]  Protime-INR     Status: None   Collection Time: 04/11/19  9:07 AM  Result Value Ref Range   Prothrombin Time 14.7 11.4 - 15.2 seconds   INR 1.2 0.8 - 1.2    Comment: (NOTE) INR goal varies based on device and disease states. Performed at [facility], [place] [address] [place], [place] [address]    Dg Chest 2 View  Result Date: 04/10/2019 CLINICAL DATA:  Dyspnea EXAM: CHEST - 2 VIEW COMPARISON:  11/05/2017 chest radiograph. FINDINGS: Stable configuration of 2 lead left subclavian pacemaker. Stable cardiomediastinal silhouette with top-normal heart size. No pneumothorax. No pleural effusion. Lungs appear clear, with no acute consolidative airspace disease and no pulmonary edema. IMPRESSION: No active cardiopulmonary disease. Electronically Signed   By: Ilona Sorrel M.D.   On: 04/10/2019 13:03    Pending Labs Unresulted Labs (From admission, onward)    Start     Ordered   04/11/19 0500  CBC  Tomorrow morning,   R     04/10/19 2050   04/11/19 0500  Renal function panel  Tomorrow morning,   R     04/10/19 2057   04/11/19 0223  Magnesium  Add-on,   AD     04/11/19 0222          Vitals/Pain Today's Vitals   04/11/19 0815 04/11/19 0900 04/11/19 0930 04/11/19 1000  BP: (!) 179/78 (!) 199/91 (!) 171/80 (!) 190/86  Pulse: 66 62 64 65  Resp: (!) _0 (!) 23  Temp:      TempSrc:      SpO2: 93% 98% 93% 97%  PainSc:         Isolation Precautions No active isolations  Medications Medications  sodium chloride flush (NS) 0.9 % injection 3 mL (3 mLs Intravenous Not Given 04/10/19 1450)  heparin injection 5,000 Units (5,000 Units Subcutaneous Given 04/11/19 0355)  acetaminophen (TYLENOL) tablet 650 mg (has no administration in time range)    Or  acetaminophen (TYLENOL) suppository 650 mg (has no administration in time range)  senna-docusate (Senokot-S) tablet 1 tablet (has no administration in time range)  ondansetron (ZOFRAN) tablet 4 mg (has no administration in time range)    Or  ondansetron (ZOFRAN) injection 4 mg (has no administration in time range)  albuterol (PROVENTIL) (2.5 MG/3ML) 0.083% nebulizer solution 3 mL (has no administration in time range)  atorvastatin (LIPITOR) tablet 40 mg (40 mg Oral Given 04/11/19 0958)  carvedilol (COREG) tablet 25 mg (25 mg Oral Given 04/10/19 2153)  cholecalciferol (VITAMIN D3) tablet 2,000 Units (2,000 Units Oral Given 04/11/19 0957)  diltiazem (CARDIZEM) tablet 30 mg (30 mg Oral Given 04/10/19 2154)  hydrALAZINE (APRESOLINE) tablet 100 mg (100 mg Oral Given 04/11/19 0959)  iron polysaccharides (NIFEREX) capsule 150 mg (has no administration in time range)  Melatonin TABS 3 mg (3 mg Oral Given 04/10/19 2350)  pantoprazole (PROTONIX) EC tablet 40 mg (40 mg Oral Given 04/11/19 0958)  multivitamin with minerals tablet 1 tablet (1 tablet Oral Given 04/11/19 0958)  polyethylene glycol (MIRALAX / GLYCOLAX) packet 17 g (has no administration in time range)  potassium chloride (KLOR-CON) packet 40 mEq (40 mEq Oral Given 04/10/19 2154)  furosemide (LASIX) 120 mg in dextrose 5 % 50 mL  IVPB (0 mg Intravenous Stopped 04/10/19 2307)    Mobility walks Low fall risk    R Recommendations: See Admitting Provider Note  Report given to:   Additional Notes:

## 2019-04-11 NOTE — Progress Notes (Addendum)
  [facility] Progress Note   Assessment/ Plan:   1 Volume overload: Advanced CKD.  Will need to initiate dialysis.  He needs access.  [facility] today 11/22 and HD #1 today 11/22.  Will need to call [facility] tomorrow to take a look at fistula. 2 CKD V--> ESRD: HD #1 today 3 Hypertension: expect to improve with UF 4. Anemia of ESRD:  hgb 10.7, gets Procrit 4000 units every 2 weeks, last given 11/18  5. Metabolic Bone Disease: Phos OK 6.  Dispo: will need to initiate CLIP--> followed by Dr Theador Hawthorne and the plan was to go to [facility]:    S/p [facility] with IR today- appreciate assistance.  For HD #1 today.     Objective:   BP (!) 160/76 (BP Location: Right Arm)   Pulse 69   Temp 98 F (36.7 C) (Oral)   Resp 18   Wt 111.1 kg   SpO2 93%   BMI 29.82 kg/m   Physical Exam: GEN older gentleman, NAD, sitting in bed HEENT EOMI PERRL NECK + JVD PULM normal WOB, some mild expiratory wheezes CV RRR II/VI systolic murmur ABD soft, nontender, NABS EXT 3+ LE edema, unchanged NEURO AAO x 3 no asterixis SKIN no rashes ACCESS: L RC fistula, nonmatured, R IJ TDC, dressing c/d/i  Labs: BMET Recent Labs  Lab 04/09/19 0951 04/09/19 1003 04/10/19 1244 04/10/19 2034  NA  --  140 140  --   K  --  3.7 3.4*  --   CL  --  107 105  --   CO2  --   --  22  --   GLUCOSE  --  97 105*  --   BUN  --  60* 63*  --   CREATININE  --  6.30* 6.33* 6.06*  CALCIUM 8.3*  --  8.4*  --    CBC Recent Labs  Lab 04/09/19 0951 04/09/19 1003 04/10/19 1244 04/10/19 2034 04/11/19 1307  WBC 6.7  --  6.1  5.9 6.0 5.4  NEUTROABS  --   --  4.2  --   --   HGB 10.1* 10.9* 10.7*  10.3* 9.9* 9.8*  HCT 31.9* 32.0* 32.4*  32.1* 30.4* 29.6*  MCV 105.3*  --  103.8*  104.2* 103.1* 101.4*  PLT 177  --  194  189 167 169    @IMGRELPRIORS @ Medications:    . atorvastatin  40 mg Oral q morning - 10a  . carvedilol  25 mg Oral BID WC  . [START ON 04/12/2019] Chlorhexidine Gluconate Cloth  6 each  Topical Q0600  . cholecalciferol  2,000 Units Oral q morning - 10a  . cloNIDine  0.1 mg Transdermal Weekly  . diltiazem  30 mg Oral BID  . fentaNYL      . gelatin adsorbable      . gelatin adsorbable      . heparin      . heparin  5,000 Units Subcutaneous Q8H  . hydrALAZINE  100 mg Oral TID  . iron polysaccharides  150 mg Oral q morning - 10a  . lidocaine      . midazolam      . multivitamin with minerals  1 tablet Oral q morning - 10a  . pantoprazole  40 mg Oral Daily  . potassium chloride  40 mEq Oral Once  . sodium chloride flush  3 mL Intravenous Once     Madelon Lips, MD 04/11/2019, 1:39 PM

## 2019-04-11 NOTE — Plan of Care (Signed)
  Problem: Education: Goal: Knowledge of General Education information will improve Description Including pain rating scale, medication(s)/side effects and non-pharmacologic comfort measures Outcome: Progressing   

## 2019-04-12 DIAGNOSIS — Z992 Dependence on renal dialysis: Secondary | ICD-10-CM

## 2019-04-12 LAB — HEPATITIS B SURFACE ANTIBODY, QUANTITATIVE: Hep B S AB Quant (Post): <3.1 m[IU]/mL — ABNORMAL LOW (ref >9.9)

## 2019-04-12 LAB — RENAL FUNCTION PANEL
Albumin: 3.1 g/dL — ABNORMAL LOW (ref 3.5–5.0)
Anion gap: 11 (ref 5–15)
BUN: 47 mg/dL — ABNORMAL HIGH (ref 8–23)
CO2: 24 mmol/L (ref 22–32)
Calcium: 8.6 mg/dL — ABNORMAL LOW (ref 8.9–10.3)
Chloride: 102 mmol/L (ref 98–111)
Creatinine, Ser: 5.02 mg/dL — ABNORMAL HIGH (ref 0.61–1.24)
GFR calc Af Amer: 13 mL/min — ABNORMAL LOW (ref >60)
GFR calc non Af Amer: 11 mL/min — ABNORMAL LOW (ref >60)
Glucose, Bld: 101 mg/dL — ABNORMAL HIGH (ref 70–99)
Phosphorus: 3.2 mg/dL (ref 2.5–4.6)
Potassium: 3.8 mmol/L (ref 3.5–5.1)
Sodium: 137 mmol/L (ref 135–145)

## 2019-04-12 MED ORDER — FUROSEMIDE 10 MG/ML IJ SOLN
80.0000 mg | Freq: Once | INTRAMUSCULAR | Status: AC
  Administered 2019-04-12: 80 mg via INTRAVENOUS
  Filled 2019-04-12: qty 8

## 2019-04-12 MED ORDER — CHLORHEXIDINE GLUCONATE CLOTH 2 % EX PADS: 6.0000 | MEDICATED_PAD | Freq: Every day | CUTANEOUS | Status: DC

## 2019-04-12 MED ORDER — HEPARIN SODIUM (PORCINE) 1000 UNIT/ML IJ SOLN
INTRAMUSCULAR | Status: AC
  Filled 2019-04-12: qty 1

## 2019-04-12 NOTE — Progress Notes (Signed)
Patient BP 190/76, HR 68. Patient asymptomatic. On call provider, IMTS made aware. No new orders given. Will continue to monitor.

## 2019-04-12 NOTE — Progress Notes (Signed)
Renal Navigator notes patient needs for OP HD referral. Navigator met with patient who states he wants to be referred to [facility] in [place] in order to stay with his Nephrologist, understanding that there is an HD unit 2 miles from his house. Patient is eager for discharge and start in the OP HD clinic as soon as possible. Navigator stated understanding and informed him that we will work as fast as possible, but given that all existing patients' schedules have been moved around to accommodate for the holiday this week, it could prove to be difficult to start a new patient quickly in the outpatient clinic. Renal Navigator has already contacted [facility] in [place] to alert them to patient's need to start this week once accepted. Renal Navigator submitted referral for OP HD treatment to [facility] to request [facility] in [place]. All requested medical records were submitted except HepB Surface Antibody, which is still pending at this time. Renal Navigator will watch for result and submit when available.  [name],  Renal Navigator [phone]

## 2019-04-12 NOTE — Progress Notes (Signed)
Subjective: [name] is feeling much improved today after dialysis yesterday.  He reports his shortness of breath has improved from yesterday.  He is able to lay flat.  He is anxious to go home.  He thinks he is already set up for dialysis in [place].  He has no other complaints or concerns.  Consults: Nephrology  Objective:  Vital signs in last 24 hours: Vitals:   04/11/19 1800 04/11/19 2041 04/12/19 0540 04/12/19 0751  BP: (!) 189/78 (!) 185/79 (!) 190/76 (!) 195/77  Pulse: 68 66 68 63  Resp: 18 16 18 18   Temp: 98.2 F (36.8 C) 98.6 F (37 C) 98.6 F (37 C) 98.4 F (36.9 C)  TempSrc: Oral Oral Oral Oral  SpO2: 98% 98% 96% 95%  Weight:   109.4 kg    Physical Exam  Constitutional: He is oriented to person, place, and time and well-developed, well-nourished, and in no distress. No distress.  HENT:  Head: Normocephalic and atraumatic.  Neck: Normal range of motion.  Cardiovascular: Normal rate, regular rhythm and normal heart sounds.  No murmur heard. Pulmonary/Chest: Effort normal and breath sounds normal. No respiratory distress.  Musculoskeletal: Normal range of motion.  Neurological: He is alert and oriented to person, place, and time.  Skin: Skin is warm and dry. He is not diaphoretic.  Right IJ tunneled HD catheter in place without surrounding erythema or evidence of bleeding  Psychiatric: Affect normal.  Nursing note and vitals reviewed.   I/Os:  Intake/Output Summary (Last 24 hours) at 04/12/2019 1137 Last data filed at 04/12/2019 0900 Gross per 24 hour  Intake 300 ml  Output 2600 ml  Net -2300 ml   Labs:  BMP Latest Ref Rng & Units 04/12/2019 04/11/2019 04/11/2019  Glucose 70 - 99 mg/dL 101(H) 168(H) 91  BUN 8 - 23 mg/dL 47(H) 42(H) 60(H)  Creatinine 0.61 - 1.24 mg/dL 5.02(H) 4.58(H) 5.74(H)  BUN/Creat Ratio 10 - 24 - - -  Sodium 135 - 145 mmol/L 137 138 140  Potassium 3.5 - 5.1 mmol/L 3.8 3.7 3.4(L)  Chloride 98 - 111 mmol/L 102 103 105  CO2 22 - 32  mmol/L 24 26 22   Calcium 8.9 - 10.3 mg/dL 8.6(L) 8.4(L) 8.6(L)   Assessment/Plan:  Assessment: Mr. Bunyard is a 68 year old male with a past medical history significant for CKD stage V, HFpEF, hypertension, COPD, atrial flutter and CAD who presents with sx of volume overload for HD initiation now status post 1 session of HD with improvement in symptoms awaiting CLIP.  Plan: Active Problems:   ESRD (end stage renal disease) (HCC)/volume overload: -Patient with history CKD stage V who presented with symptoms of volume overload for HD initiation -Mr. Luczynski is status post right IJ tunneled HD catheter and one session of HD with symptom improvement -Oxygenating well on room air without uremic symptoms -Awaiting CLIP  Plan: -Continue dialysis per nephrology -CLIP placement per dialysis social worker -Strict I's and O's  HFpEF: -Patient with history CKD stage V and HFpEF who presented with symptoms of volume overload in need of HD initiation -Symptoms improved with HD  Plan: -Continue dialysis per nephrology -Strict I's and O's  Hypertension: -Uncontrolled with systolic BP up to 440, most recently 195 -Clonidine 0.1 mg patch started yesterday -Nephrology was hopeful pressure would improve with dialysis, but no obvious improvement noted.  He may have much more volume to give.  Plan: -Continue home Coreg, Cardizem and hydralazine -Continue clonidine patch -Continue dialysis per nephrology  Dispo: Anticipated  discharge pending CLIP.  Al Decant, MD 04/12/2019, 11:37 AM Pager: [phone]

## 2019-04-12 NOTE — Progress Notes (Signed)
Renal Navigator has faxed HepB surface antibody result (twice) to [facility] and have asked to be called when they receive. If not called by tomorrow morning, Renal Navigator will call again.  Alphonzo Cruise, [place] Renal Navigator [phone]

## 2019-04-12 NOTE — Progress Notes (Signed)
[facility] Progress Note   Assessment/ Plan:   1 Volume overload: Advanced CKD.  initiated HD.  Tunneled catheter 11/22 and HD #1 on 11/22.    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  HD today, 11/23 as well.  Will start CLIP and will discuss with SW   3 Hypertension: continue current regimen and UF with HD.  Lasix IV once now   4. Anemia of ESRD:  on Procrit 4000 units every 2 weeks per charting, last given 11/18   5. Metabolic Bone Disease: Phos OK  6.  Dispo: will initiate CLIP--> followed by Dr Theador Hawthorne and the plan was to go to [facility] and patient reports this had been initiated outpatient   Subjective:    Had first HD treatment yesterday with 1.4 kg UF.  He states had been planning to start HD at [facility] but not sure what's been set up yet.  Had 1.3 liters UOP over 11/22.  Review of systems:  Some shortness of breath - better No chest pain  No n/v    Objective:   BP (!) 190/76 (BP Location: Right Arm)   Pulse 68   Temp 98.6 F (37 C) (Oral)   Resp 18   Wt 109.4 kg   SpO2 96%   BMI 29.36 kg/m   Physical Exam GEN adult male in NAD on room air HEENT EOMI sclera anicteric NECK  supple + JVD PULM normal WOB, clear but reduced  CV RRR no rub  ABD soft, nontender, nondistended EXT 2+ LE edema, unchanged NEURO AAO x 3 no asterixis ACCESS: L RC fistula, nonmatured, R IJ TDC, dressing c/d/i  Labs: BMET Recent Labs  Lab 04/09/19 0951 04/09/19 1003 04/10/19 1244 04/10/19 2034 04/11/19 1307 04/11/19 2028 04/12/19 0614  NA  --  140 140  --  140 138 137  K  --  3.7 3.4*  --  3.4* 3.7 3.8  CL  --  107 105  --  105 103 102  CO2  --   --  22  --  22 26 24   GLUCOSE  --  97 105*  --  91 168* 101*  BUN  --  60* 63*  --  60* 42* 47*  CREATININE  --  6.30* 6.33* 6.06* 5.74* 4.58* 5.02*  CALCIUM 8.3*  --  8.4*  --  8.6* 8.4* 8.6*  PHOS  --   --   --   --  4.5 2.9 3.2   CBC Recent Labs  Lab 04/10/19 1244 04/10/19 2034 04/11/19 1307  04/11/19 2028  WBC 6.1  5.9 6.0 5.4 6.5  NEUTROABS 4.2  --   --   --   HGB 10.7*  10.3* 9.9* 9.8* 9.6*  HCT 32.4*  32.1* 30.4* 29.6* 29.5*  MCV 103.8*  104.2* 103.1* 101.4* 101.4*  PLT 194  189 167 169 148*    @IMGRELPRIORS @ Medications:    . atorvastatin  40 mg Oral q morning - 10a  . carvedilol  25 mg Oral BID WC  . Chlorhexidine Gluconate Cloth  6 each Topical Q0600  . cholecalciferol  2,000 Units Oral q morning - 10a  . cloNIDine  0.1 mg Transdermal Weekly  . diltiazem  30 mg Oral BID  . heparin  5,000 Units Subcutaneous Q8H  . hydrALAZINE  100 mg Oral TID  . iron polysaccharides  150 mg Oral q morning - 10a  . multivitamin with minerals  1 tablet Oral q morning - 10a  .  pantoprazole  40 mg Oral Daily  . potassium chloride  40 mEq Oral Once  . ramelteon  8 mg Oral QHS  . sodium chloride flush  3 mL Intravenous Once    Claudia Desanctis 04/12/2019, 7:38 AM

## 2019-04-13 DIAGNOSIS — N049 Nephrotic syndrome with unspecified morphologic changes: Secondary | ICD-10-CM

## 2019-04-13 MED ORDER — FUROSEMIDE 10 MG/ML IJ SOLN
80.0000 mg | Freq: Once | INTRAMUSCULAR | Status: AC
  Administered 2019-04-13: 80 mg via INTRAVENOUS
  Filled 2019-04-13: qty 8

## 2019-04-13 MED ORDER — CLONIDINE 0.1 MG/24HR TD PTWK: 0.1000 mg | MEDICATED_PATCH | TRANSDERMAL | 12 refills | Status: DC

## 2019-04-13 NOTE — Plan of Care (Signed)
See discharged note.

## 2019-04-13 NOTE — Progress Notes (Signed)
[facility] Progress Note   Assessment/ Plan:   1 Volume overload: Advanced CKD.  initiated HD.  Tunneled catheter 11/22 and HD #1 on 11/22.    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  Has spot at [place]   3 Hypertension: continue current regimen and UF with HD.  Lasix IV once now.  hydralazine is due and he missed a dose yesterday.  Would provide torsemide 100 mg PO daily on non-HD days.    4. Anemia of ESRD:  s/p Procrit 4000 units every 2 weeks per charting, last given 11/18   5. Metabolic Bone Disease: Phos OK.   6.  Dispo: will initiate CLIP--> followed by Dr Theador Hawthorne and the plan was to go to [facility] and patient reports this had been initiated outpatient   Subjective:    Had HD on 11/23 with 2kg UF.  He has been accepted for a TTS spot at [facility] - they are open Thursday and he will dialyze there Thursday, 11/26 per his choice.  He chose not to dialyze here today and to go there Thursday and voiced understanding of the schedule.  He had 2.5 liters UOP over 11/23.  States his BP at home is 170-180 usually and reports his torsemide was just increased to 100mg  TID at home.  Review of systems:  Denies shortness of breath  No chest pain  No n/v    Objective:   BP (!) 187/80 (BP Location: Right Arm)   Pulse 61   Temp 98 F (36.7 C)   Resp 18   Wt 108 kg   SpO2 93%   BMI 28.98 kg/m   Physical Exam GEN adult male in NAD on room air   HEENT EOMI sclera anicteric NECK  supple  Trachea midline PULM normal WOB, clear but reduced  CV RRR no rub  ABD soft, nontender, nondistended EXT 1+ LE edema NEURO AAO x 3 no asterixis ACCESS: L RC fistula, nonmatured, R IJ TDC, dressing c/d/i  Labs: BMET Recent Labs  Lab 04/09/19 0951 04/09/19 1003 04/10/19 1244 04/10/19 2034 04/11/19 1307 04/11/19 2028 04/12/19 0614  NA  --  140 140  --  140 138 137  K  --  3.7 3.4*  --  3.4* 3.7 3.8  CL  --  107 105  --  105 103 102  CO2  --   --  22  --   22 26 24   GLUCOSE  --  97 105*  --  91 168* 101*  BUN  --  60* 63*  --  60* 42* 47*  CREATININE  --  6.30* 6.33* 6.06* 5.74* 4.58* 5.02*  CALCIUM 8.3*  --  8.4*  --  8.6* 8.4* 8.6*  PHOS  --   --   --   --  4.5 2.9 3.2   CBC Recent Labs  Lab 04/10/19 1244 04/10/19 2034 04/11/19 1307 04/11/19 2028  WBC 6.1  5.9 6.0 5.4 6.5  NEUTROABS 4.2  --   --   --   HGB 10.7*  10.3* 9.9* 9.8* 9.6*  HCT 32.4*  32.1* 30.4* 29.6* 29.5*  MCV 103.8*  104.2* 103.1* 101.4* 101.4*  PLT 194  189 167 169 148*    @IMGRELPRIORS @ Medications:    . atorvastatin  40 mg Oral q morning - 10a  . carvedilol  25 mg Oral BID WC  . Chlorhexidine Gluconate Cloth  6 each Topical Q0600  . cholecalciferol  2,000  Units Oral q morning - 10a  . cloNIDine  0.1 mg Transdermal Weekly  . diltiazem  30 mg Oral BID  . heparin  5,000 Units Subcutaneous Q8H  . hydrALAZINE  100 mg Oral TID  . iron polysaccharides  150 mg Oral q morning - 10a  . multivitamin with minerals  1 tablet Oral q morning - 10a  . pantoprazole  40 mg Oral Daily  . ramelteon  8 mg Oral QHS  . sodium chloride flush  3 mL Intravenous Once    Claudia Desanctis 04/13/2019, 9:45 AM

## 2019-04-13 NOTE — Progress Notes (Signed)
DISCHARGE NOTE HOME [name] to be discharged to home per MD order. 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 Advised Prescriptions given to patient; medication list explained in detail. Patient verbalized understanding.  Skin clean, dry and intact without evidence of skin break down, no evidence of skin tears noted. IV catheter discontinued intact. Site without signs and symptoms of complications. Dressing and pressure applied. Pt denies pain at the site currently. No complaints noted.  Patient free of lines, drains, and wounds.   An After Visit Summary (AVS) was printed and given to the patient. Patient escorted via wheelchair, and discharged home via private auto.  Roe, Zenon Mayo, RN

## 2019-04-13 NOTE — Progress Notes (Signed)
Patient has been accepted at [facility] for OP HD treatment on a TTS schedule with a seat time of 12:00pm. He needs to arrive to his appointments 20 minutes early. He is stable for discharge today prior to HD given that he had a treatment yesterday in the hospital. He will start in the clinic on Thursday, 04/15/19 and needs to report to the clinic at 10:15am on his first day of treatment. [facility] is open on Thanksgiving). This plan has been presented to patient and he is eager to go home today and very agreeable. Primary/Dr. Madilyn Fireman updated as well as Nephrologist/Dr. Royce Macadamia. Patient is cleared for discharge from a Renal/OP HD standpoint. Patient very appreciative.  Alphonzo Cruise, [place] [facility] Navigator [phone]

## 2019-04-13 NOTE — Discharge Instructions (Signed)
You were admitted to the hospital for symptoms of volume overload and the need to initiate dialysis.  You received a tunneled catheter for dialysis.  You received 2 sessions of dialysis.  We set you up with outpatient dialysis in [place].  You should attend dialysis this Thursday morning as scheduled for you.  We started you on a clonidine patch for your blood pressure.  You were given that patch Sunday and should change it every Sunday. Please take your torsemide as directed by the nephrologist. Please take your other medicines as directed here. You should follow-up with your primary care doctor within a week.

## 2019-04-13 NOTE — Progress Notes (Signed)
   Subjective: Mr. Gillentine is feeling well today. He is set up to start dialysis in [place] on Thursday. He has no other complaints or concerns.  Consults: Nephrology  Objective:  Vital signs in last 24 hours: Vitals:   04/12/19 1942 04/12/19 2020 04/13/19 0509 04/13/19 0826  BP: (!) 201/99 (!) 196/81 (!) 193/81 (!) 187/80  Pulse: 64 64 66 61  Resp: 18 18  18   Temp: 98.3 F (36.8 C) 98.3 F (36.8 C) 97.8 F (36.6 C) 98 F (36.7 C)  TempSrc: Oral  Oral   SpO2: 97% 96% 92% 93%  Weight: 108 kg 108 kg     Physical Exam  Constitutional: He is oriented to person, place, and time and well-developed, well-nourished, and in no distress. No distress.  HENT:  Head: Normocephalic and atraumatic.  Neck: Normal range of motion.  Cardiovascular: Normal rate, regular rhythm and normal heart sounds.  No murmur heard. Pulmonary/Chest: Effort normal and breath sounds normal. No respiratory distress.  Musculoskeletal: Normal range of motion.  Neurological: He is alert and oriented to person, place, and time.  Skin: Skin is warm and dry. He is not diaphoretic.  Right IJ tunneled HD catheter in place without surrounding erythema or evidence of bleeding  Psychiatric: Affect normal.  Nursing note and vitals reviewed.   I/Os:  Intake/Output Summary (Last 24 hours) at 04/13/2019 1151 Last data filed at 04/13/2019 0900 Gross per 24 hour  Intake 600 ml  Output 4656 ml  Net -4056 ml   Labs:  BMP Latest Ref Rng & Units 04/12/2019 04/11/2019 04/11/2019  Glucose 70 - 99 mg/dL 101(H) 168(H) 91  BUN 8 - 23 mg/dL 47(H) 42(H) 60(H)  Creatinine 0.61 - 1.24 mg/dL 5.02(H) 4.58(H) 5.74(H)  BUN/Creat Ratio 10 - 24 - - -  Sodium 135 - 145 mmol/L 137 138 140  Potassium 3.5 - 5.1 mmol/L 3.8 3.7 3.4(L)  Chloride 98 - 111 mmol/L 102 103 105  CO2 22 - 32 mmol/L 24 26 22   Calcium 8.9 - 10.3 mg/dL 8.6(L) 8.4(L) 8.6(L)   Assessment/Plan:  Assessment: Mr. Teed is a 68 year old male with a past medical  history significant for CKD stage V, HFpEF, hypertension, COPD, atrial flutter and CAD who presents with sx of volume overload for HD initiation now status post 1 session of HD with improvement in symptoms awaiting CLIP.  Plan: Active Problems:   ESRD (end stage renal disease) (HCC)/volume overload: -Patient with history CKD stage V who presented with symptoms of volume overload for HD initiation -Mr. Yan is status post right IJ tunneled HD catheter and 2 sessions of HD with symptom improvement -Oxygenating well on room air without uremic symptoms -dialysis set up in [place], will start Thursday morning  Plan: -dc today with dialysis Thursday  HFpEF: -Patient with history CKD stage V and HFpEF who presented with symptoms of volume overload in need of HD initiation -Symptoms improved with HD  Plan: -dc today with dialysis Thursday  Hypertension: -Uncontrolled with systolic BP up to 253 -Clonidine 0.1 mg patch started 2 days ago, likely hasn't had time to provide its full affect -Nephrology was hopeful pressure would improve with dialysis, but no obvious improvement noted.    Plan: -Continue home Coreg, Cardizem and hydralazine -Continue clonidine patch here and outpatient -Lasix per nephro  Dispo: Anticipated discharge pending CLIP.  Al Decant, MD 04/13/2019, 11:51 AM Pager: [phone]

## 2019-04-13 NOTE — Discharge Summary (Signed)
Name: [name] MRN: [id number] DOB: 1951-01-25 68 y.o. PCP: Manon Hilding, MD  Date of Admission: 04/10/2019 12:29 PM Date of Discharge: 04/13/2019  Attending Physician: No att. providers found  Discharge Diagnosis:  1. ESRD 2. Hypertension 3. HFpEF  Discharge Medications: Allergies as of 04/13/2019   No Known Allergies     Medication List    TAKE these medications   acetaminophen 325 MG tablet Commonly known as: TYLENOL Take 975 mg by mouth every morning.   atorvastatin 40 MG tablet Commonly known as: LIPITOR Take 1 tablet (40 mg total) by mouth every evening. What changed: when to take this   beclomethasone 80 MCG/ACT inhaler Commonly known as: QVAR Inhale 2 puffs into the lungs 2 (two) times daily as needed (shortness of breath).   carvedilol 25 MG tablet Commonly known as: COREG TAKE ONE TABLET BY MOUTH TWICE DAILY. What changed: when to take this   cloNIDine 0.1 mg/24hr patch Commonly known as: CATAPRES - Dosed in mg/24 hr Place 1 patch (0.1 mg total) onto the skin once a week. Start taking on: April 18, 2019   diltiazem 30 MG tablet Commonly known as: CARDIZEM TAKE ONE TABLET BY MOUTH TWICE DAILY.   epoetin alfa 4000 UNIT/ML injection Commonly known as: EPOGEN Inject 4,000 Units into the vein See admin instructions. Infusion done as needed for hematocrit <10   hydrALAZINE 100 MG tablet Commonly known as: APRESOLINE TAKE ONE TABLET BY MOUTH THREE TIMES DAILY What changed:   how much to take  when to take this  additional instructions   insulin glargine 100 unit/mL Sopn Commonly known as: LANTUS Inject 35 Units into the skin at bedtime as needed (CBG >150).   iron polysaccharides 150 MG capsule Commonly known as: NIFEREX Take 150 mg by mouth every morning.   Linzess 290 MCG Caps capsule Generic drug: linaclotide Take 290 mcg by mouth daily as needed (constipation).   Melatonin 10 MG Tabs Take 10 mg by mouth at bedtime as  needed (sleep).   multivitamin with minerals Tabs tablet Take 1 tablet by mouth every morning.   omeprazole 20 MG capsule Commonly known as: PRILOSEC Take 20 mg by mouth every morning.   oxyCODONE-acetaminophen 5-325 MG tablet Commonly known as: Percocet Take 1 tablet by mouth every 6 (six) hours as needed for severe pain.   polyethylene glycol 17 g packet Commonly known as: MIRALAX / GLYCOLAX Take 17 g by mouth every morning. Mix in morning coffee   potassium chloride SA 20 MEQ tablet Commonly known as: KLOR-CON Take 1 tablet (20 mEq total) by mouth daily. What changed: when to take this   Sennosides 25 MG Tabs Take 50 mg by mouth at bedtime.   sildenafil 100 MG tablet Commonly known as: VIAGRA Take 1 tablet (100 mg total) by mouth as needed for erectile dysfunction. What changed: when to take this   silver sulfADIAZINE 1 % cream Commonly known as: SILVADENE Apply 1 application topically daily as needed (For wound care with dressing).   torsemide 100 MG tablet Commonly known as: DEMADEX Take 100 mg by mouth 2 (two) times daily.   Ventolin HFA 108 (90 Base) MCG/ACT inhaler Generic drug: albuterol Inhale 2 puffs into the lungs every 6 (six) hours as needed for wheezing or shortness of breath.   Vitamin D3 50 MCG (2000 UT) Tabs Take 2,000 Units by mouth every morning.       Disposition and follow-up:   [name] was discharged from  [facility] in Good condition.  At the hospital follow up visit please address:   1.  Please ensure patient adherence to dialysis schedule; please check blood pressure after multiple days on clonidine patch.  2.  Labs / imaging needed at time of follow-up: na  3.  Pending labs/ test needing follow-up: na  Follow-up Appointments: Follow-up Information    Sasser, Silvestre Moment, MD Follow up in 1 week(s).   Specialty: Family Medicine Contact information: [address] [address] [place] Hospital Course by problem list:  1. ESRD -Patient with history CKD stage V who presented with symptoms of volume overload for HD initiation -[name] is status post right IJ tunneled HD catheter and 2 sessions of HD with symptom improvement -Oxygenating well on room air without uremic symptoms -dialysis set up in [place], will start Thursday morning  Plan: -dc today with dialysis 11/26  2. Hypertension -Uncontrolled with systolic BP up to 425 -Clonidine 0.1 mg patch started 2 days ago, likely hasn't had time to provide its full affect -Nephrology was hopeful pressure would improve with dialysis, but no obvious improvement noted.    Plan: -Continue home Coreg, Cardizem and hydralazine -Continue clonidine patch here and outpatient -Lasix today per nephro -continue home torsemide per nephro  3. HFpEF -Patient with history CKD stage V and HFpEF who presented with symptoms of volume overload in need of HD initiation -Symptoms improved with HD  Plan: -dc today with dialysis 11/26  Discharge Vitals:   BP (!) 187/80 (BP Location: Right Arm)   Pulse 61   Temp 98 F (36.7 C)   Resp 18   Wt 108 kg   SpO2 93%   BMI 28.98 kg/m   Pertinent Labs, Studies, and Procedures:    Intake/Output Summary (Last 24 hours) at 04/13/2019 1151 Last data filed at 04/13/2019 0900    Gross per 24 hour  Intake 600 ml  Output 4656 ml  Net -4056 ml   Labs:  BMP Latest Ref Rng & Units 04/12/2019 04/11/2019 04/11/2019  Glucose 70 - 99 mg/dL 101(H) 168(H) 91  BUN 8 - 23 mg/dL 47(H) 42(H) 60(H)  Creatinine 0.61 - 1.24 mg/dL 5.02(H) 4.58(H) 5.74(H)  BUN/Creat Ratio 10 - 24 - - -  Sodium 135 - 145 mmol/L 137 138 140  Potassium 3.5 - 5.1 mmol/L 3.8 3.7 3.4(L)  Chloride 98 - 111 mmol/L 102 103 105  CO2 22 - 32 mmol/L 24 26 22   Calcium 8.9 - 10.3 mg/dL 8.6(L) 8.4(L) 8.6(L)    Discharge Instructions: Discharge Instructions    Diet - low sodium heart healthy   Complete by: As directed     Increase activity slowly   Complete by: As directed       Signed: Al Decant, MD 04/13/2019, 2:15 PM   Pager: [phone]

## 2019-04-15 DIAGNOSIS — D631 Anemia in chronic kidney disease: Secondary | ICD-10-CM | POA: Diagnosis not present

## 2019-04-15 DIAGNOSIS — Z992 Dependence on renal dialysis: Secondary | ICD-10-CM | POA: Diagnosis not present

## 2019-04-15 DIAGNOSIS — N2581 Secondary hyperparathyroidism of renal origin: Secondary | ICD-10-CM | POA: Diagnosis not present

## 2019-04-15 DIAGNOSIS — I259 Chronic ischemic heart disease, unspecified: Secondary | ICD-10-CM | POA: Diagnosis not present

## 2019-04-15 DIAGNOSIS — N186 End stage renal disease: Secondary | ICD-10-CM | POA: Diagnosis not present

## 2019-04-15 DIAGNOSIS — E119 Type 2 diabetes mellitus without complications: Secondary | ICD-10-CM | POA: Diagnosis not present

## 2019-04-17 DIAGNOSIS — N186 End stage renal disease: Secondary | ICD-10-CM | POA: Diagnosis not present

## 2019-04-17 DIAGNOSIS — Z992 Dependence on renal dialysis: Secondary | ICD-10-CM | POA: Diagnosis not present

## 2019-04-17 DIAGNOSIS — D631 Anemia in chronic kidney disease: Secondary | ICD-10-CM | POA: Diagnosis not present

## 2019-04-17 DIAGNOSIS — N2581 Secondary hyperparathyroidism of renal origin: Secondary | ICD-10-CM | POA: Diagnosis not present

## 2019-04-19 ENCOUNTER — Other Ambulatory Visit: Payer: Self-pay

## 2019-04-19 DIAGNOSIS — E1142 Type 2 diabetes mellitus with diabetic polyneuropathy: Secondary | ICD-10-CM | POA: Diagnosis not present

## 2019-04-19 DIAGNOSIS — L97511 Non-pressure chronic ulcer of other part of right foot limited to breakdown of skin: Secondary | ICD-10-CM | POA: Diagnosis not present

## 2019-04-19 NOTE — Patient Outreach (Signed)
[name]  Alert date 04/18/2019 Alert reason:  Sad, hopeless, anxious, empty  Reviewed medical record. Placed call to patient who reports that he is doing ok. Reports being angry with prognosis of not being able to get a kidney transplant.  Patient reports that he was turned down at [facility]. Reports waiting to hear final decision from [place]. Reports he was told he had to stop smoking. Patient reports he has started to work on smoking cessation.  Reviewed Albert alert. Patient states that " I am not depressed"  Reviewed with patient if he felt suicidal or homicidal to call 911. Reviewed with patient to talk with primary MD about his anger and he agreed.  PLAN: patient denies needs. Will close case. Tomasa Rand, RN, BSN, CEN [facility] [employer] [phone]

## 2019-04-20 ENCOUNTER — Other Ambulatory Visit: Payer: Self-pay | Admitting: *Deleted

## 2019-04-20 ENCOUNTER — Telehealth: Payer: Self-pay | Admitting: *Deleted

## 2019-04-20 DIAGNOSIS — D509 Iron deficiency anemia, unspecified: Secondary | ICD-10-CM | POA: Diagnosis not present

## 2019-04-20 DIAGNOSIS — Z992 Dependence on renal dialysis: Secondary | ICD-10-CM | POA: Diagnosis not present

## 2019-04-20 DIAGNOSIS — N186 End stage renal disease: Secondary | ICD-10-CM

## 2019-04-20 DIAGNOSIS — Z23 Encounter for immunization: Secondary | ICD-10-CM | POA: Diagnosis not present

## 2019-04-20 DIAGNOSIS — E559 Vitamin D deficiency, unspecified: Secondary | ICD-10-CM | POA: Diagnosis not present

## 2019-04-20 NOTE — Telephone Encounter (Signed)
Call from Seth Ward with Dr. Alexander Mt. Patient has started HD has functioning TDC, but claims fistula is "weak" and was unable to cannulate when patient was in the hospital. [facility] has not attempted to cannulate. Request appointment for evaluation of access. Will schedule access duplex and appt with PA on non HD day ASAP. Scheduling to call patient  And he is aware to expect call. Agreeable with C.Liles PA.

## 2019-04-21 ENCOUNTER — Encounter (HOSPITAL_COMMUNITY): Payer: Medicare Other

## 2019-04-21 ENCOUNTER — Encounter (HOSPITAL_COMMUNITY): Admission: RE | Admit: 2019-04-21 | Payer: Medicare Other | Source: Ambulatory Visit

## 2019-04-21 DIAGNOSIS — N189 Chronic kidney disease, unspecified: Secondary | ICD-10-CM | POA: Diagnosis not present

## 2019-04-21 DIAGNOSIS — I10 Essential (primary) hypertension: Secondary | ICD-10-CM | POA: Diagnosis not present

## 2019-04-21 DIAGNOSIS — E039 Hypothyroidism, unspecified: Secondary | ICD-10-CM | POA: Diagnosis not present

## 2019-04-21 DIAGNOSIS — E782 Mixed hyperlipidemia: Secondary | ICD-10-CM | POA: Diagnosis not present

## 2019-04-21 DIAGNOSIS — E1165 Type 2 diabetes mellitus with hyperglycemia: Secondary | ICD-10-CM | POA: Diagnosis not present

## 2019-04-21 DIAGNOSIS — K219 Gastro-esophageal reflux disease without esophagitis: Secondary | ICD-10-CM | POA: Diagnosis not present

## 2019-04-22 DIAGNOSIS — Z23 Encounter for immunization: Secondary | ICD-10-CM | POA: Diagnosis not present

## 2019-04-22 DIAGNOSIS — E559 Vitamin D deficiency, unspecified: Secondary | ICD-10-CM | POA: Diagnosis not present

## 2019-04-22 DIAGNOSIS — Z992 Dependence on renal dialysis: Secondary | ICD-10-CM | POA: Diagnosis not present

## 2019-04-22 DIAGNOSIS — N186 End stage renal disease: Secondary | ICD-10-CM | POA: Diagnosis not present

## 2019-04-22 DIAGNOSIS — D509 Iron deficiency anemia, unspecified: Secondary | ICD-10-CM | POA: Diagnosis not present

## 2019-04-23 ENCOUNTER — Encounter: Payer: Self-pay | Admitting: *Deleted

## 2019-04-23 ENCOUNTER — Other Ambulatory Visit: Payer: Self-pay | Admitting: *Deleted

## 2019-04-23 ENCOUNTER — Other Ambulatory Visit: Payer: Self-pay

## 2019-04-23 ENCOUNTER — Ambulatory Visit (HOSPITAL_COMMUNITY)
Admission: RE | Admit: 2019-04-23 | Discharge: 2019-04-23 | Disposition: A | Discharge status: Home / Self Care | Payer: Medicare Other | Source: Ambulatory Visit | Attending: Family | Admitting: Family

## 2019-04-23 ENCOUNTER — Ambulatory Visit (INDEPENDENT_AMBULATORY_CARE_PROVIDER_SITE_OTHER): Payer: Medicare Other | Admitting: Physician Assistant

## 2019-04-23 VITALS — BP 135/71 | HR 60 | Temp 97.3°F | Resp 18 | Ht 76.0 in | Wt 231.0 lb

## 2019-04-23 DIAGNOSIS — N186 End stage renal disease: Secondary | ICD-10-CM | POA: Diagnosis not present

## 2019-04-23 DIAGNOSIS — I2510 Atherosclerotic heart disease of native coronary artery without angina pectoris: Secondary | ICD-10-CM

## 2019-04-23 NOTE — H&P (View-Only) (Signed)
Established Dialysis Access   History of Present Illness   [name] is a 68 y.o. (1950/12/03) male who presents for re-evaluation of permanent access.  Surgical history significant for right basilic vein fistula in October 2019 which was subsequently ligated due to steal syndrome.  He then had left radiocephalic fistula creation by Dr. Donnetta Hutching in October 2019.  He was recently started on hemodialysis.  Left radiocephalic fistula was felt to be too small and thus right IJ TDC was placed for initiation of HD.  He is dialyzing on a Tuesday Thursday Saturday schedule.  He is not taking any blood thinners.  He has a left-sided pacemaker.    The patient's PMH, PSH, SH, and FamHx were reviewed and are unchanged from prior visit.  Current Outpatient Medications  Medication Sig Dispense Refill  . acetaminophen (TYLENOL) 325 MG tablet Take 975 mg by mouth every morning.     Marland Kitchen albuterol (VENTOLIN HFA) 108 (90 BASE) MCG/ACT inhaler Inhale 2 puffs into the lungs every 6 (six) hours as needed for wheezing or shortness of breath.     Marland Kitchen atorvastatin (LIPITOR) 40 MG tablet Take 1 tablet (40 mg total) by mouth every evening. (Patient taking differently: Take 40 mg by mouth every morning. ) 90 tablet 2  . beclomethasone (QVAR) 80 MCG/ACT inhaler Inhale 2 puffs into the lungs 2 (two) times daily as needed (shortness of breath).    . carvedilol (COREG) 25 MG tablet TAKE ONE TABLET BY MOUTH TWICE DAILY. (Patient taking differently: Take 25 mg by mouth 2 (two) times daily with a meal. ) 180 tablet 2  . Cholecalciferol (VITAMIN D3) 2000 units TABS Take 2,000 Units by mouth every morning.     . cloNIDine (CATAPRES - DOSED IN MG/24 HR) 0.1 mg/24hr patch Place 1 patch (0.1 mg total) onto the skin once a week. 4 patch 12  . diltiazem (CARDIZEM) 30 MG tablet TAKE ONE TABLET BY MOUTH TWICE DAILY. (Patient taking differently: Take 30 mg by mouth 2 (two) times daily. ) 60 tablet 6  . epoetin alfa (EPOGEN) 4000 UNIT/ML  injection Inject 4,000 Units into the vein See admin instructions. Infusion done as needed for hematocrit <10    . hydrALAZINE (APRESOLINE) 100 MG tablet TAKE ONE TABLET BY MOUTH THREE TIMES DAILY (Patient taking differently: Take 100-200 mg by mouth See admin instructions. Take two tablets (200 mg) by mouth every morning and one tablet (100 mg) at night) 270 tablet 2  . insulin glargine (LANTUS) 100 unit/mL SOPN Inject 35 Units into the skin at bedtime as needed (CBG >150).    . iron polysaccharides (NIFEREX) 150 MG capsule Take 150 mg by mouth every morning.    . linaclotide (LINZESS) 290 MCG CAPS capsule Take 290 mcg by mouth daily as needed (constipation).    . Melatonin 10 MG TABS Take 10 mg by mouth at bedtime as needed (sleep).     . Multiple Vitamin (MULTIVITAMIN WITH MINERALS) TABS tablet Take 1 tablet by mouth every morning.     Marland Kitchen omeprazole (PRILOSEC) 20 MG capsule Take 20 mg by mouth every morning.     . polyethylene glycol (MIRALAX / GLYCOLAX) packet Take 17 g by mouth every morning. Mix in morning coffee    . potassium chloride SA (K-DUR,KLOR-CON) 20 MEQ tablet Take 1 tablet (20 mEq total) by mouth daily. (Patient taking differently: Take 20 mEq by mouth every morning. ) 30 tablet 0  . Sennosides 25 MG TABS Take 50 mg  by mouth at bedtime.    . sildenafil (VIAGRA) 100 MG tablet Take 1 tablet (100 mg total) by mouth as needed for erectile dysfunction. (Patient taking differently: Take 100 mg by mouth daily as needed for erectile dysfunction. ) 10 tablet 0  . silver sulfADIAZINE (SILVADENE) 1 % cream Apply 1 application topically daily as needed (For wound care with dressing).     . torsemide (DEMADEX) 100 MG tablet Take 100 mg by mouth 2 (two) times daily.   4  . oxyCODONE-acetaminophen (PERCOCET) 5-325 MG tablet Take 1 tablet by mouth every 6 (six) hours as needed for severe pain. (Patient not taking: Reported on 04/23/2019) 8 tablet 0   No current facility-administered medications for  this visit.     On ROS today: 10 system ROS negative unless otherwise noted in HPI   Physical Examination   Vitals:   04/23/19 1033  BP: 135/71  Pulse: 60  Resp: 18  Temp: (!) 97.3 F (36.3 C)  TempSrc: Temporal  SpO2: 97%  Weight: 231 lb (104.8 kg)  Height: 6\' 4"  (1.93 m)   Body mass index is 28.12 kg/m.  General Alert, O x 3, WD, NAD  Pulmonary Sym exp, good B air movt, CTA B  Cardiac RRR, Nl S1, S2  Vascular Vessel Right Left  Radial Palpable Palpable  Brachial Palpable Palpable  Ulnar Not palpable Not palpable    Musculo- skeletal Symmetrical grip strength; easily palpable thrill near anastomosis however beyond distal forearm this is more difficult to feel; branching off of fistula also palpable in forearm  Neurologic A&O; CN grossly intact     Non-invasive Vascular Imaging   left Arm Access Duplex:  Patent left radiocephalic fistula with stenosis and distal forearm and low flow volume beyond this area of stenosis; diameter of fistula less than 6 mm in forearm     Medical Decision Making   [name] is a 68 y.o. male who presents with ESRD requiring hemodialysis.    Patent left radiocephalic fistula with area of stenosis in distal forearm  Plan will be for fistulogram with possible intervention; I also discussed with the patient the possibility of sidebranch ligation based on fistulogram results  Continue HD via right IJ [facility] for now Risk, benefits, and alternatives to access surgery were discussed.   The patient is aware the risks include but are not limited to: bleeding, steal syndrome, thrombosis, failure to mature, and need for additional procedures.   The patient agrees to proceed with the procedure.   Dagoberto Ligas PA-C [facility] Office: [phone]  Clinic MD: Donzetta Matters

## 2019-04-23 NOTE — Progress Notes (Signed)
Established Dialysis Access   History of Present Illness   [name] is a 68 y.o. (17-Sep-1950) male who presents for re-evaluation of permanent access.  Surgical history significant for right basilic vein fistula in October 2019 which was subsequently ligated due to steal syndrome.  He then had left radiocephalic fistula creation by Dr. Donnetta Hutching in October 2019.  He was recently started on hemodialysis.  Left radiocephalic fistula was felt to be too small and thus right IJ TDC was placed for initiation of HD.  He is dialyzing on a Tuesday Thursday Saturday schedule.  He is not taking any blood thinners.  He has a left-sided pacemaker.    The patient's PMH, PSH, SH, and FamHx were reviewed and are unchanged from prior visit.  Current Outpatient Medications  Medication Sig Dispense Refill  . acetaminophen (TYLENOL) 325 MG tablet Take 975 mg by mouth every morning.     Marland Kitchen albuterol (VENTOLIN HFA) 108 (90 BASE) MCG/ACT inhaler Inhale 2 puffs into the lungs every 6 (six) hours as needed for wheezing or shortness of breath.     Marland Kitchen atorvastatin (LIPITOR) 40 MG tablet Take 1 tablet (40 mg total) by mouth every evening. (Patient taking differently: Take 40 mg by mouth every morning. ) 90 tablet 2  . beclomethasone (QVAR) 80 MCG/ACT inhaler Inhale 2 puffs into the lungs 2 (two) times daily as needed (shortness of breath).    . carvedilol (COREG) 25 MG tablet TAKE ONE TABLET BY MOUTH TWICE DAILY. (Patient taking differently: Take 25 mg by mouth 2 (two) times daily with a meal. ) 180 tablet 2  . Cholecalciferol (VITAMIN D3) 2000 units TABS Take 2,000 Units by mouth every morning.     . cloNIDine (CATAPRES - DOSED IN MG/24 HR) 0.1 mg/24hr patch Place 1 patch (0.1 mg total) onto the skin once a week. 4 patch 12  . diltiazem (CARDIZEM) 30 MG tablet TAKE ONE TABLET BY MOUTH TWICE DAILY. (Patient taking differently: Take 30 mg by mouth 2 (two) times daily. ) 60 tablet 6  . epoetin alfa (EPOGEN) 4000 UNIT/ML  injection Inject 4,000 Units into the vein See admin instructions. Infusion done as needed for hematocrit <10    . hydrALAZINE (APRESOLINE) 100 MG tablet TAKE ONE TABLET BY MOUTH THREE TIMES DAILY (Patient taking differently: Take 100-200 mg by mouth See admin instructions. Take two tablets (200 mg) by mouth every morning and one tablet (100 mg) at night) 270 tablet 2  . insulin glargine (LANTUS) 100 unit/mL SOPN Inject 35 Units into the skin at bedtime as needed (CBG >150).    . iron polysaccharides (NIFEREX) 150 MG capsule Take 150 mg by mouth every morning.    . linaclotide (LINZESS) 290 MCG CAPS capsule Take 290 mcg by mouth daily as needed (constipation).    . Melatonin 10 MG TABS Take 10 mg by mouth at bedtime as needed (sleep).     . Multiple Vitamin (MULTIVITAMIN WITH MINERALS) TABS tablet Take 1 tablet by mouth every morning.     Marland Kitchen omeprazole (PRILOSEC) 20 MG capsule Take 20 mg by mouth every morning.     . polyethylene glycol (MIRALAX / GLYCOLAX) packet Take 17 g by mouth every morning. Mix in morning coffee    . potassium chloride SA (K-DUR,KLOR-CON) 20 MEQ tablet Take 1 tablet (20 mEq total) by mouth daily. (Patient taking differently: Take 20 mEq by mouth every morning. ) 30 tablet 0  . Sennosides 25 MG TABS Take 50 mg  by mouth at bedtime.    . sildenafil (VIAGRA) 100 MG tablet Take 1 tablet (100 mg total) by mouth as needed for erectile dysfunction. (Patient taking differently: Take 100 mg by mouth daily as needed for erectile dysfunction. ) 10 tablet 0  . silver sulfADIAZINE (SILVADENE) 1 % cream Apply 1 application topically daily as needed (For wound care with dressing).     . torsemide (DEMADEX) 100 MG tablet Take 100 mg by mouth 2 (two) times daily.   4  . oxyCODONE-acetaminophen (PERCOCET) 5-325 MG tablet Take 1 tablet by mouth every 6 (six) hours as needed for severe pain. (Patient not taking: Reported on 04/23/2019) 8 tablet 0   No current facility-administered medications for  this visit.     On ROS today: 10 system ROS negative unless otherwise noted in HPI   Physical Examination   Vitals:   04/23/19 1033  BP: 135/71  Pulse: 60  Resp: 18  Temp: (!) 97.3 F (36.3 C)  TempSrc: Temporal  SpO2: 97%  Weight: 231 lb (104.8 kg)  Height: 6\' 4"  (1.93 m)   Body mass index is 28.12 kg/m.  General Alert, O x 3, WD, NAD  Pulmonary Sym exp, good B air movt, CTA B  Cardiac RRR, Nl S1, S2  Vascular Vessel Right Left  Radial Palpable Palpable  Brachial Palpable Palpable  Ulnar Not palpable Not palpable    Musculo- skeletal Symmetrical grip strength; easily palpable thrill near anastomosis however beyond distal forearm this is more difficult to feel; branching off of fistula also palpable in forearm  Neurologic A&O; CN grossly intact     Non-invasive Vascular Imaging   left Arm Access Duplex:  Patent left radiocephalic fistula with stenosis and distal forearm and low flow volume beyond this area of stenosis; diameter of fistula less than 6 mm in forearm     Medical Decision Making   [name] is a 68 y.o. male who presents with ESRD requiring hemodialysis.    Patent left radiocephalic fistula with area of stenosis in distal forearm  Plan will be for fistulogram with possible intervention; I also discussed with the patient the possibility of sidebranch ligation based on fistulogram results  Continue HD via right IJ [facility] for now Risk, benefits, and alternatives to access surgery were discussed.   The patient is aware the risks include but are not limited to: bleeding, steal syndrome, thrombosis, failure to mature, and need for additional procedures.   The patient agrees to proceed with the procedure.   Dagoberto Ligas PA-C [facility] [place] Office: [phone]  Clinic MD: Donzetta Matters

## 2019-04-24 DIAGNOSIS — Z992 Dependence on renal dialysis: Secondary | ICD-10-CM | POA: Diagnosis not present

## 2019-04-24 DIAGNOSIS — D509 Iron deficiency anemia, unspecified: Secondary | ICD-10-CM | POA: Diagnosis not present

## 2019-04-24 DIAGNOSIS — N186 End stage renal disease: Secondary | ICD-10-CM | POA: Diagnosis not present

## 2019-04-24 DIAGNOSIS — E559 Vitamin D deficiency, unspecified: Secondary | ICD-10-CM | POA: Diagnosis not present

## 2019-04-24 DIAGNOSIS — Z23 Encounter for immunization: Secondary | ICD-10-CM | POA: Diagnosis not present

## 2019-04-26 DIAGNOSIS — Z23 Encounter for immunization: Secondary | ICD-10-CM | POA: Diagnosis not present

## 2019-04-27 ENCOUNTER — Other Ambulatory Visit: Payer: Self-pay

## 2019-04-27 ENCOUNTER — Other Ambulatory Visit (HOSPITAL_COMMUNITY)
Admission: RE | Admit: 2019-04-27 | Discharge: 2019-04-27 | Disposition: A | Discharge status: Home / Self Care | Payer: Medicare Other | Source: Ambulatory Visit | Attending: Vascular Surgery | Admitting: Vascular Surgery

## 2019-04-27 DIAGNOSIS — N186 End stage renal disease: Secondary | ICD-10-CM | POA: Diagnosis not present

## 2019-04-27 DIAGNOSIS — Z992 Dependence on renal dialysis: Secondary | ICD-10-CM | POA: Diagnosis not present

## 2019-04-27 DIAGNOSIS — E559 Vitamin D deficiency, unspecified: Secondary | ICD-10-CM | POA: Diagnosis not present

## 2019-04-27 DIAGNOSIS — Z01812 Encounter for preprocedural laboratory examination: Secondary | ICD-10-CM | POA: Diagnosis not present

## 2019-04-27 DIAGNOSIS — Z20828 Contact with and (suspected) exposure to other viral communicable diseases: Secondary | ICD-10-CM | POA: Diagnosis not present

## 2019-04-27 DIAGNOSIS — D509 Iron deficiency anemia, unspecified: Secondary | ICD-10-CM | POA: Diagnosis not present

## 2019-04-27 DIAGNOSIS — Z23 Encounter for immunization: Secondary | ICD-10-CM | POA: Diagnosis not present

## 2019-04-27 LAB — SARS CORONAVIRUS 2 (TAT 6-24 HRS): SARS Coronavirus 2: NEGATIVE

## 2019-04-29 DIAGNOSIS — N186 End stage renal disease: Secondary | ICD-10-CM | POA: Diagnosis not present

## 2019-04-29 DIAGNOSIS — Z23 Encounter for immunization: Secondary | ICD-10-CM | POA: Diagnosis not present

## 2019-04-29 DIAGNOSIS — Z992 Dependence on renal dialysis: Secondary | ICD-10-CM | POA: Diagnosis not present

## 2019-04-29 DIAGNOSIS — D509 Iron deficiency anemia, unspecified: Secondary | ICD-10-CM | POA: Diagnosis not present

## 2019-04-29 DIAGNOSIS — E559 Vitamin D deficiency, unspecified: Secondary | ICD-10-CM | POA: Diagnosis not present

## 2019-04-30 ENCOUNTER — Ambulatory Visit (HOSPITAL_COMMUNITY)
Admission: RE | Admit: 2019-04-30 | Discharge: 2019-04-30 | Disposition: A | Discharge status: Home / Self Care | Payer: Medicare Other | Attending: Vascular Surgery | Admitting: Vascular Surgery

## 2019-04-30 ENCOUNTER — Other Ambulatory Visit: Payer: Self-pay

## 2019-04-30 ENCOUNTER — Encounter (HOSPITAL_COMMUNITY)
Admission: RE | Disposition: A | Discharge status: Home / Self Care | Payer: Self-pay | Source: Home / Self Care | Attending: Vascular Surgery

## 2019-04-30 DIAGNOSIS — Z794 Long term (current) use of insulin: Secondary | ICD-10-CM | POA: Diagnosis not present

## 2019-04-30 DIAGNOSIS — Y841 Kidney dialysis as the cause of abnormal reaction of the patient, or of later complication, without mention of misadventure at the time of the procedure: Secondary | ICD-10-CM | POA: Insufficient documentation

## 2019-04-30 DIAGNOSIS — Z79899 Other long term (current) drug therapy: Secondary | ICD-10-CM | POA: Diagnosis not present

## 2019-04-30 DIAGNOSIS — Z992 Dependence on renal dialysis: Secondary | ICD-10-CM | POA: Diagnosis not present

## 2019-04-30 DIAGNOSIS — T82858A Stenosis of vascular prosthetic devices, implants and grafts, initial encounter: Secondary | ICD-10-CM | POA: Diagnosis not present

## 2019-04-30 DIAGNOSIS — N186 End stage renal disease: Secondary | ICD-10-CM | POA: Diagnosis not present

## 2019-04-30 DIAGNOSIS — Z950 Presence of cardiac pacemaker: Secondary | ICD-10-CM | POA: Insufficient documentation

## 2019-04-30 DIAGNOSIS — Z7951 Long term (current) use of inhaled steroids: Secondary | ICD-10-CM | POA: Insufficient documentation

## 2019-04-30 HISTORY — PX: A/V FISTULAGRAM: CATH118298

## 2019-04-30 LAB — POCT I-STAT, CHEM 8
BUN: 33 mg/dL — ABNORMAL HIGH (ref 8–23)
Calcium, Ion: 1.11 mmol/L — ABNORMAL LOW (ref 1.15–1.40)
Chloride: 98 mmol/L (ref 98–111)
Creatinine, Ser: 4.8 mg/dL — ABNORMAL HIGH (ref 0.61–1.24)
Glucose, Bld: 100 mg/dL — ABNORMAL HIGH (ref 70–99)
HCT: 39 % (ref 39.0–52.0)
Hemoglobin: 13.3 g/dL (ref 13.0–17.0)
Potassium: 4.9 mmol/L (ref 3.5–5.1)
Sodium: 138 mmol/L (ref 135–145)
TCO2: 32 mmol/L (ref 22–32)

## 2019-04-30 SURGERY — A/V FISTULAGRAM
Anesthesia: LOCAL | Laterality: Left

## 2019-04-30 MED ORDER — LIDOCAINE HCL (PF) 1 % IJ SOLN
INTRAMUSCULAR | Status: DC | PRN
  Administered 2019-04-30: 2 mL

## 2019-04-30 MED ORDER — SODIUM CHLORIDE 0.9% FLUSH: 3.0000 mL | INTRAVENOUS | Status: DC | PRN

## 2019-04-30 MED ORDER — HEPARIN (PORCINE) IN NACL 1000-0.9 UT/500ML-% IV SOLN
INTRAVENOUS | Status: DC | PRN
  Administered 2019-04-30: 500 mL

## 2019-04-30 MED ORDER — HYDRALAZINE HCL 20 MG/ML IJ SOLN: 5.0000 mg | INTRAMUSCULAR | Status: DC | PRN

## 2019-04-30 MED ORDER — ACETAMINOPHEN 325 MG PO TABS: 650.0000 mg | ORAL_TABLET | ORAL | Status: DC | PRN

## 2019-04-30 MED ORDER — SODIUM CHLORIDE 0.9% FLUSH: 3.0000 mL | Freq: Two times a day (BID) | INTRAVENOUS | Status: DC

## 2019-04-30 MED ORDER — HEPARIN (PORCINE) IN NACL 1000-0.9 UT/500ML-% IV SOLN
INTRAVENOUS | Status: AC
  Filled 2019-04-30: qty 500

## 2019-04-30 MED ORDER — SODIUM CHLORIDE 0.9 % IV SOLN: 250.0000 mL | INTRAVENOUS | Status: DC | PRN

## 2019-04-30 MED ORDER — LABETALOL HCL 5 MG/ML IV SOLN: 10.0000 mg | INTRAVENOUS | Status: DC | PRN

## 2019-04-30 MED ORDER — IODIXANOL 320 MG/ML IV SOLN
INTRAVENOUS | Status: DC | PRN
  Administered 2019-04-30: 50 mL

## 2019-04-30 MED ORDER — LIDOCAINE HCL (PF) 1 % IJ SOLN
INTRAMUSCULAR | Status: AC
  Filled 2019-04-30: qty 30

## 2019-04-30 MED ORDER — ONDANSETRON HCL 4 MG/2ML IJ SOLN: 4.0000 mg | Freq: Four times a day (QID) | INTRAMUSCULAR | Status: DC | PRN

## 2019-04-30 SURGICAL SUPPLY — 9 items
BAG SNAP BAND KOVER 36X36 (MISCELLANEOUS) ×2 IMPLANT
COVER DOME SNAP 22 D (MISCELLANEOUS) ×2 IMPLANT
KIT MICROPUNCTURE NIT STIFF (SHEATH) ×1 IMPLANT
PROTECTION STATION PRESSURIZED (MISCELLANEOUS) ×2
SHEATH PROBE COVER 6X72 (BAG) ×3 IMPLANT
STATION PROTECTION PRESSURIZED (MISCELLANEOUS) ×1 IMPLANT
STOPCOCK MORSE 400PSI 3WAY (MISCELLANEOUS) ×2 IMPLANT
TRAY PV CATH (CUSTOM PROCEDURE TRAY) ×2 IMPLANT
TUBING CIL FLEX 10 FLL-RA (TUBING) ×2 IMPLANT

## 2019-04-30 NOTE — Discharge Instructions (Signed)

## 2019-04-30 NOTE — Interval H&P Note (Signed)
History and Physical Interval Note:  04/30/2019 7:27 AM  [name]  has presented today for surgery, with the diagnosis of complication of fistula.  The various methods of treatment have been discussed with the patient and family. After consideration of risks, benefits and other options for treatment, the patient has consented to  Procedure(s): A/V FISTULAGRAM (Left) as a surgical intervention.  The patient's history has been reviewed, patient examined, no change in status, stable for surgery.  I have reviewed the patient's chart and labs.  Questions were answered to the patient's satisfaction.     Ruta Hinds

## 2019-04-30 NOTE — Op Note (Signed)
Procedure: Left arm fistulogram  Preoperative diagnosis: Poorly functioning left arm AV fistula  Postoperative diagnosis: Same  Anesthesia: Local  Operative findings: 1.  Left side pacemaker but no significant venous outflow obstruction  2.  Occluded midportion left radiocephalic AV fistula, multisegment stenosis proximal 5 cm of vein  3.  Fistula patency maintained by sidebranch collateralization  4.  Patent axillary and cephalic vein in the upper arm  Operative details: After obtaining form consent, the patient taken the PV lab.  The patient placed supine position angio table.  Left upper extremities prepped and draped in usual sterile fashion.  Local anesthesia was infiltrated in the midportion of the left forearm.  Ultrasound was used to identify the left arm AV fistula.  Image was obtained for the patient's record.  The fistula was patent.  Micropuncture needles used to cannulate the AV fistula which was about 4 mm in diameter.  Micropuncture wire was advanced down to the level of the wrist towards the arterial anastomosis.  Micropuncture sheath was advanced over the guidewire.  Contrast angiogram was then obtained with a blood pressure cuff in place to reflux contrast across the arterial anastomosis.  The arterial anastomosis is patent the cephalic outflow vein is very diseased with multiple segments of 50 to 70% stenosis extending over about the first 4 to 5 cm of the fistula.  Just above our micropuncture sheath the fistula is occluded over a segment about 7 cm.  The fistula patency is maintained by sidebranch collaterals.  These eventually fill the more distal cephalic vein and the deeper basilic and brachial system.  The upper arm cephalic vein is patent and is about 3 to 4 mm in diameter.  The axillary vein is patent.  At this point a figure-of-eight Monocryl stitch was placed around the sheath and sheath removed and hemostasis obtained with direct pressure.  The patient tired procedure  well and there were no complications.  Operative management: The patient has a patent outflow cephalic vein.  He may be a candidate for a left brachiocephalic AV fistula left upper arm graft or potentially left basilic vein fistula.  The patient has previously had a right arm AV fistula that was ligated for steal.  Unfortunately his outflow on the left side may be compromised by his pacemaker.  However, I believe this is his best option for access at this point prior to proceeding to a thigh graft.  The patient is also considering peritoneal dialysis.  Patient will be scheduled for a new arm AV fistula most likely next Tuesday or Thursday.  Ruta Hinds, MD [facility] Office: [phone]

## 2019-05-01 DIAGNOSIS — N186 End stage renal disease: Secondary | ICD-10-CM | POA: Diagnosis not present

## 2019-05-01 DIAGNOSIS — D509 Iron deficiency anemia, unspecified: Secondary | ICD-10-CM | POA: Diagnosis not present

## 2019-05-01 DIAGNOSIS — Z23 Encounter for immunization: Secondary | ICD-10-CM | POA: Diagnosis not present

## 2019-05-01 DIAGNOSIS — Z992 Dependence on renal dialysis: Secondary | ICD-10-CM | POA: Diagnosis not present

## 2019-05-01 DIAGNOSIS — E559 Vitamin D deficiency, unspecified: Secondary | ICD-10-CM | POA: Diagnosis not present

## 2019-05-03 ENCOUNTER — Other Ambulatory Visit: Payer: Self-pay

## 2019-05-03 DIAGNOSIS — Z23 Encounter for immunization: Secondary | ICD-10-CM | POA: Diagnosis not present

## 2019-05-03 DIAGNOSIS — E559 Vitamin D deficiency, unspecified: Secondary | ICD-10-CM | POA: Diagnosis not present

## 2019-05-03 DIAGNOSIS — N186 End stage renal disease: Secondary | ICD-10-CM | POA: Diagnosis not present

## 2019-05-03 DIAGNOSIS — Z992 Dependence on renal dialysis: Secondary | ICD-10-CM | POA: Diagnosis not present

## 2019-05-03 DIAGNOSIS — D509 Iron deficiency anemia, unspecified: Secondary | ICD-10-CM | POA: Diagnosis not present

## 2019-05-04 ENCOUNTER — Other Ambulatory Visit (HOSPITAL_COMMUNITY)
Admission: RE | Admit: 2019-05-04 | Discharge: 2019-05-04 | Disposition: A | Discharge status: Home / Self Care | Payer: Medicare Other | Source: Ambulatory Visit | Attending: Vascular Surgery | Admitting: Vascular Surgery

## 2019-05-04 DIAGNOSIS — Z01812 Encounter for preprocedural laboratory examination: Secondary | ICD-10-CM | POA: Insufficient documentation

## 2019-05-04 DIAGNOSIS — Z20828 Contact with and (suspected) exposure to other viral communicable diseases: Secondary | ICD-10-CM | POA: Insufficient documentation

## 2019-05-04 LAB — SARS CORONAVIRUS 2 (TAT 6-24 HRS): SARS Coronavirus 2: NEGATIVE

## 2019-05-05 ENCOUNTER — Encounter (HOSPITAL_COMMUNITY): Payer: Self-pay | Admitting: Vascular Surgery

## 2019-05-05 ENCOUNTER — Other Ambulatory Visit: Payer: Self-pay

## 2019-05-05 ENCOUNTER — Ambulatory Visit (INDEPENDENT_AMBULATORY_CARE_PROVIDER_SITE_OTHER): Payer: Medicare Other | Admitting: *Deleted

## 2019-05-05 DIAGNOSIS — Z992 Dependence on renal dialysis: Secondary | ICD-10-CM | POA: Diagnosis not present

## 2019-05-05 DIAGNOSIS — N186 End stage renal disease: Secondary | ICD-10-CM | POA: Diagnosis not present

## 2019-05-05 DIAGNOSIS — D509 Iron deficiency anemia, unspecified: Secondary | ICD-10-CM | POA: Diagnosis not present

## 2019-05-05 DIAGNOSIS — R001 Bradycardia, unspecified: Secondary | ICD-10-CM

## 2019-05-05 DIAGNOSIS — Z23 Encounter for immunization: Secondary | ICD-10-CM | POA: Diagnosis not present

## 2019-05-05 DIAGNOSIS — E559 Vitamin D deficiency, unspecified: Secondary | ICD-10-CM | POA: Diagnosis not present

## 2019-05-05 NOTE — Anesthesia Preprocedure Evaluation (Addendum)
Anesthesia Evaluation  Patient identified by MRN, date of birth, ID band Patient awake    Reviewed: Allergy & Precautions, NPO status , Patient's Chart, lab work & pertinent test results  Airway Mallampati: II  TM Distance: >3 FB Neck ROM: Full    Dental  (+) Dental Advisory Given   Pulmonary asthma , sleep apnea , COPD, Current Smoker,    breath sounds clear to auscultation       Cardiovascular hypertension, Pt. on medications + CAD, + Cardiac Stents, + Peripheral Vascular Disease and +CHF  + dysrhythmias + pacemaker  Rhythm:Regular Rate:Normal     Neuro/Psych negative neurological ROS     GI/Hepatic negative GI ROS, Neg liver ROS,   Endo/Other  diabetes  Renal/GU ESRFRenal disease     Musculoskeletal   Abdominal   Peds  Hematology negative hematology ROS (+)   Anesthesia Other Findings   Reproductive/Obstetrics                            Anesthesia Physical Anesthesia Plan  ASA: IV  Anesthesia Plan: MAC   Post-op Pain Management:    Induction: Intravenous  PONV Risk Score and Plan: 0 and Propofol infusion, Ondansetron and Treatment may vary due to age or medical condition  Airway Management Planned: Natural Airway and Simple Face Mask  Additional Equipment:   Intra-op Plan:   Post-operative Plan:   Informed Consent: I have reviewed the patients History and Physical, chart, labs and discussed the procedure including the risks, benefits and alternatives for the proposed anesthesia with the patient or authorized representative who has indicated his/her understanding and acceptance.       Plan Discussed with: CRNA  Anesthesia Plan Comments: (Follows with EP cardiology for hx of symptomatic bradycardia s/p pacemaker. Last seen by Dr. Rayann Heman 10/30/18. Per note, "1.  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 Normal pacemaker function. See PaceArt report for  recent remote. RV lead threshold is chronically elevated but stable. He V paces <1%. 2.  Paroxysmal atrial fibrillation/ atrial flutter AF burden remains low by last remote. CHADS2VASC is 3.  With anemia and CKD, will stop Eliquis for now. 4.  CAD No ischemic symptoms. Stable. No change required today."  Follows with general cardiology for CAD s/p stent to RCA in 1999. Low risk, nonischemic stress 12/15/18; echo done same day showed EF 60-65%, no significant valvular abnormalities.   ESRD with recently initiated HD via right IJ.  Will need DOS labs and eval.  Nuclear stress 12/15/18: There was no ST segment deviation noted during stress. 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 This is a low risk study. The left ventricular ejection fraction is mildly decreased by nuclear imaging (46%). By echo done same day LVEF is 60-65%. Likely processing error by nuclear study, 60-65% would be considered the true LVEF.  TTE 12/15/18:  1. The left ventricle has normal systolic function with an ejection fraction of 60-65%. The cavity size was normal. There is severely increased left ventricular wall thickness. Left ventricular diastolic Doppler parameters are consistent with impaired  relaxation.  2. The right ventricle has normal systolic function. The cavity was normal. There is mildly increased right ventricular wall thickness.  3. Left atrial size was moderately dilated.  4. The aortic valve is tricuspid. Mild thickening of the aortic valve. Mild calcification of the aortic valve. No stenosis of the aortic valve. Mild aortic annular calcification noted.  5.  The mitral valve is abnormal. Mild thickening of the mitral valve leaflet. Mild calcification of the mitral valve leaflet. There is mild mitral annular calcification present. No evidence of mitral valve stenosis.  6. The aorta is normal in size and structure.  7. The aortic root is normal in size and  structure.  8. Pulmonary hypertension is indeterminate, inadequate TR jet.  9. The inferior vena cava was dilated in size with >50% respiratory variability.)       Anesthesia Quick Evaluation

## 2019-05-05 NOTE — Progress Notes (Signed)
Anesthesia Chart Review: Same day workup  Follows with EP cardiology for hx of symptomatic bradycardia s/p pacemaker. Last seen by Dr. Rayann Heman 10/30/18. Per note, "1.  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 Normal pacemaker function. See PaceArt report for recent remote. RV lead threshold is chronically elevated but stable. He V paces <1%. 2.  Paroxysmal atrial fibrillation/ atrial flutter AF burden remains low by last remote. CHADS2VASC is 3.  With anemia and CKD, will stop Eliquis for now. 4.  CAD No ischemic symptoms. Stable. No change required today."  Follows with general cardiology for CAD s/p stent to RCA in 1999. Low risk, nonischemic stress 12/15/18; echo done same day showed EF 60-65%, no significant valvular abnormalities.   ESRD with recently initiated HD via right IJ.  Will need DOS labs and eval.  Nuclear stress 12/15/18:  There was no ST segment deviation noted during stress.  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  This is a low risk study.  The left ventricular ejection fraction is mildly decreased by nuclear imaging (46%). By echo done same day LVEF is 60-65%. Likely processing error by nuclear study, 60-65% would be considered the true LVEF.  TTE 12/15/18:  1. The left ventricle has normal systolic function with an ejection fraction of 60-65%. The cavity size was normal. There is severely increased left ventricular wall thickness. Left ventricular diastolic Doppler parameters are consistent with impaired  relaxation.  2. The right ventricle has normal systolic function. The cavity was normal. There is mildly increased right ventricular wall thickness.  3. Left atrial size was moderately dilated.  4. The aortic valve is tricuspid. Mild thickening of the aortic valve. Mild calcification of the aortic valve. No stenosis of the aortic valve. Mild aortic annular calcification noted.  5. The mitral valve is  abnormal. Mild thickening of the mitral valve leaflet. Mild calcification of the mitral valve leaflet. There is mild mitral annular calcification present. No evidence of mitral valve stenosis.  6. The aorta is normal in size and structure.  7. The aortic root is normal in size and structure.  8. Pulmonary hypertension is indeterminate, inadequate TR jet.  9. The inferior vena cava was dilated in size with >50% respiratory variability.  Wynonia Musty [facility] Short Stay Center/Anesthesiology Phone [phone] 05/05/2019 11:59 AM

## 2019-05-06 ENCOUNTER — Ambulatory Visit (HOSPITAL_COMMUNITY)
Admission: RE | Admit: 2019-05-06 | Discharge: 2019-05-06 | Disposition: A | Discharge status: Home / Self Care | Payer: Medicare Other | Attending: Vascular Surgery | Admitting: Vascular Surgery

## 2019-05-06 ENCOUNTER — Ambulatory Visit (HOSPITAL_COMMUNITY): Payer: Medicare Other | Admitting: Physician Assistant

## 2019-05-06 ENCOUNTER — Encounter (HOSPITAL_COMMUNITY)
Admission: RE | Disposition: A | Discharge status: Home / Self Care | Payer: Self-pay | Source: Home / Self Care | Attending: Vascular Surgery

## 2019-05-06 ENCOUNTER — Other Ambulatory Visit: Payer: Self-pay

## 2019-05-06 ENCOUNTER — Encounter (HOSPITAL_COMMUNITY): Payer: Self-pay | Admitting: Vascular Surgery

## 2019-05-06 DIAGNOSIS — Z992 Dependence on renal dialysis: Secondary | ICD-10-CM | POA: Insufficient documentation

## 2019-05-06 DIAGNOSIS — D631 Anemia in chronic kidney disease: Secondary | ICD-10-CM | POA: Insufficient documentation

## 2019-05-06 DIAGNOSIS — E8889 Other specified metabolic disorders: Secondary | ICD-10-CM | POA: Insufficient documentation

## 2019-05-06 DIAGNOSIS — Z950 Presence of cardiac pacemaker: Secondary | ICD-10-CM | POA: Diagnosis not present

## 2019-05-06 DIAGNOSIS — X58XXXA Exposure to other specified factors, initial encounter: Secondary | ICD-10-CM | POA: Diagnosis not present

## 2019-05-06 DIAGNOSIS — T82898A Other specified complication of vascular prosthetic devices, implants and grafts, initial encounter: Secondary | ICD-10-CM | POA: Diagnosis not present

## 2019-05-06 DIAGNOSIS — I120 Hypertensive chronic kidney disease with stage 5 chronic kidney disease or end stage renal disease: Secondary | ICD-10-CM | POA: Insufficient documentation

## 2019-05-06 DIAGNOSIS — Z79899 Other long term (current) drug therapy: Secondary | ICD-10-CM | POA: Diagnosis not present

## 2019-05-06 DIAGNOSIS — Z794 Long term (current) use of insulin: Secondary | ICD-10-CM | POA: Diagnosis not present

## 2019-05-06 DIAGNOSIS — N186 End stage renal disease: Secondary | ICD-10-CM | POA: Insufficient documentation

## 2019-05-06 DIAGNOSIS — Z7951 Long term (current) use of inhaled steroids: Secondary | ICD-10-CM | POA: Diagnosis not present

## 2019-05-06 DIAGNOSIS — E1122 Type 2 diabetes mellitus with diabetic chronic kidney disease: Secondary | ICD-10-CM | POA: Diagnosis not present

## 2019-05-06 DIAGNOSIS — N185 Chronic kidney disease, stage 5: Secondary | ICD-10-CM | POA: Diagnosis not present

## 2019-05-06 HISTORY — PX: AV FISTULA PLACEMENT: SHX1204

## 2019-05-06 LAB — CUP PACEART REMOTE DEVICE CHECK
Battery Remaining Longevity: 180 mo
Battery Remaining Percentage: 100 %
Brady Statistic RA Percent Paced: 38 %
Brady Statistic RV Percent Paced: 8 %
Date Time Interrogation Session: 20201216044100
Implantable Lead Implant Date: 20190514
Implantable Lead Implant Date: 20190514
Implantable Lead Location: 753859
Implantable Lead Location: 753860
Implantable Lead Model: 7741
Implantable Lead Model: 7742
Implantable Lead Serial Number: 1015225
Implantable Lead Serial Number: 1020907
Implantable Pulse Generator Implant Date: 20190514
Lead Channel Impedance Value: 711 Ohm
Lead Channel Impedance Value: 924 Ohm
Lead Channel Pacing Threshold Amplitude: 0.7 V
Lead Channel Pacing Threshold Pulse Width: 0.4 ms
Lead Channel Setting Pacing Amplitude: 2 V
Lead Channel Setting Pacing Amplitude: 3.5 V
Lead Channel Setting Pacing Pulse Width: 1 ms
Lead Channel Setting Sensing Sensitivity: 1.5 mV
Pulse Gen Serial Number: 832937

## 2019-05-06 LAB — GLUCOSE, CAPILLARY
Glucose-Capillary: 101 mg/dL — ABNORMAL HIGH (ref 70–99)
Glucose-Capillary: 89 mg/dL (ref 70–99)
Glucose-Capillary: 86 mg/dL (ref 70–99)

## 2019-05-06 LAB — POCT I-STAT, CHEM 8
BUN: 29 mg/dL — ABNORMAL HIGH (ref 8–23)
Calcium, Ion: 0.99 mmol/L — ABNORMAL LOW (ref 1.15–1.40)
Chloride: 98 mmol/L (ref 98–111)
Creatinine, Ser: 4.5 mg/dL — ABNORMAL HIGH (ref 0.61–1.24)
Glucose, Bld: 103 mg/dL — ABNORMAL HIGH (ref 70–99)
HCT: 41 % (ref 39.0–52.0)
Hemoglobin: 13.9 g/dL (ref 13.0–17.0)
Potassium: 4.1 mmol/L (ref 3.5–5.1)
Sodium: 136 mmol/L (ref 135–145)
TCO2: 28 mmol/L (ref 22–32)

## 2019-05-06 SURGERY — ARTERIOVENOUS (AV) FISTULA CREATION
Anesthesia: Monitor Anesthesia Care | Laterality: Left

## 2019-05-06 MED ORDER — HEPARIN SODIUM (PORCINE) 1000 UNIT/ML IJ SOLN
INTRAMUSCULAR | Status: AC
  Filled 2019-05-06: qty 1

## 2019-05-06 MED ORDER — CEFAZOLIN SODIUM-DEXTROSE 2-4 GM/100ML-% IV SOLN
2.0000 g | INTRAVENOUS | Status: AC
  Administered 2019-05-06: 2 g via INTRAVENOUS

## 2019-05-06 MED ORDER — 0.9 % SODIUM CHLORIDE (POUR BTL) OPTIME
TOPICAL | Status: DC | PRN
  Administered 2019-05-06: 1000 mL

## 2019-05-06 MED ORDER — LIDOCAINE HCL (PF) 1 % IJ SOLN
INTRAMUSCULAR | Status: AC
  Filled 2019-05-06: qty 30

## 2019-05-06 MED ORDER — LIDOCAINE 2% (20 MG/ML) 5 ML SYRINGE
INTRAMUSCULAR | Status: DC | PRN
  Administered 2019-05-06 (×2): 40 mg via INTRAVENOUS

## 2019-05-06 MED ORDER — SODIUM CHLORIDE 0.9 % IV SOLN
INTRAVENOUS | Status: AC
  Filled 2019-05-06: qty 1.2

## 2019-05-06 MED ORDER — PHENYLEPHRINE HCL-NACL 10-0.9 MG/250ML-% IV SOLN
INTRAVENOUS | Status: DC | PRN
  Administered 2019-05-06: 20 ug/min via INTRAVENOUS

## 2019-05-06 MED ORDER — ONDANSETRON HCL 4 MG/2ML IJ SOLN
INTRAMUSCULAR | Status: AC
  Filled 2019-05-06: qty 2

## 2019-05-06 MED ORDER — HEPARIN SODIUM (PORCINE) 1000 UNIT/ML IJ SOLN
INTRAMUSCULAR | Status: DC | PRN
  Administered 2019-05-06: 3000 [IU] via INTRAVENOUS

## 2019-05-06 MED ORDER — ONDANSETRON HCL 4 MG/2ML IJ SOLN: 4.0000 mg | Freq: Once | INTRAMUSCULAR | Status: DC | PRN

## 2019-05-06 MED ORDER — OXYCODONE-ACETAMINOPHEN 5-325 MG PO TABS: 1.0000 | ORAL_TABLET | Freq: Four times a day (QID) | ORAL | 0 refills | Status: DC | PRN

## 2019-05-06 MED ORDER — CHLORHEXIDINE GLUCONATE 4 % EX LIQD: 60.0000 mL | Freq: Once | CUTANEOUS | Status: DC

## 2019-05-06 MED ORDER — FENTANYL CITRATE (PF) 100 MCG/2ML IJ SOLN: 25.0000 ug | INTRAMUSCULAR | Status: DC | PRN

## 2019-05-06 MED ORDER — ONDANSETRON HCL 4 MG/2ML IJ SOLN
INTRAMUSCULAR | Status: DC | PRN
  Administered 2019-05-06: 4 mg via INTRAVENOUS

## 2019-05-06 MED ORDER — FENTANYL CITRATE (PF) 250 MCG/5ML IJ SOLN
INTRAMUSCULAR | Status: AC
  Filled 2019-05-06: qty 5

## 2019-05-06 MED ORDER — PROPOFOL 10 MG/ML IV BOLUS
INTRAVENOUS | Status: DC | PRN
  Administered 2019-05-06: 20 mg via INTRAVENOUS
  Administered 2019-05-06: 10 mg via INTRAVENOUS
  Administered 2019-05-06: 30 mg via INTRAVENOUS

## 2019-05-06 MED ORDER — SODIUM CHLORIDE 0.9 % IV SOLN: INTRAVENOUS | Status: DC

## 2019-05-06 MED ORDER — SODIUM CHLORIDE 0.9 % IV SOLN
INTRAVENOUS | Status: DC | PRN
  Administered 2019-05-06: 500 mL

## 2019-05-06 MED ORDER — FENTANYL CITRATE (PF) 250 MCG/5ML IJ SOLN
INTRAMUSCULAR | Status: DC | PRN
  Administered 2019-05-06: 25 ug via INTRAVENOUS
  Administered 2019-05-06 (×2): 50 ug via INTRAVENOUS
  Administered 2019-05-06: 25 ug via INTRAVENOUS

## 2019-05-06 MED ORDER — PROPOFOL 500 MG/50ML IV EMUL
INTRAVENOUS | Status: DC | PRN
  Administered 2019-05-06: 100 ug/kg/min via INTRAVENOUS
  Administered 2019-05-06: 50 ug/kg/min via INTRAVENOUS

## 2019-05-06 MED ORDER — LIDOCAINE HCL 1 % IJ SOLN
INTRAMUSCULAR | Status: DC | PRN
  Administered 2019-05-06: 7 mL

## 2019-05-06 MED ORDER — CEFAZOLIN SODIUM-DEXTROSE 2-4 GM/100ML-% IV SOLN
INTRAVENOUS | Status: AC
  Filled 2019-05-06: qty 100

## 2019-05-06 MED ORDER — LIDOCAINE 2% (20 MG/ML) 5 ML SYRINGE
INTRAMUSCULAR | Status: AC
  Filled 2019-05-06: qty 5

## 2019-05-06 SURGICAL SUPPLY — 34 items
ADH SKN CLS APL DERMABOND .7 (GAUZE/BANDAGES/DRESSINGS) ×1
AGENT HMST SPONGE THK3/8 (HEMOSTASIS)
ARMBAND PINK RESTRICT EXTREMIT (MISCELLANEOUS) ×3 IMPLANT
CANISTER SUCT 3000ML PPV (MISCELLANEOUS) ×2 IMPLANT
CLIP VESOCCLUDE MED 6/CT (CLIP) ×2 IMPLANT
CLIP VESOCCLUDE SM WIDE 6/CT (CLIP) ×2 IMPLANT
COVER PROBE W GEL 5X96 (DRAPES) ×2 IMPLANT
COVER WAND RF STERILE (DRAPES) ×1 IMPLANT
DECANTER SPIKE VIAL GLASS SM (MISCELLANEOUS) ×2 IMPLANT
DERMABOND ADVANCED (GAUZE/BANDAGES/DRESSINGS) ×1
DERMABOND ADVANCED .7 DNX12 (GAUZE/BANDAGES/DRESSINGS) ×1 IMPLANT
ELECT REM PT RETURN 9FT ADLT (ELECTROSURGICAL) ×2
ELECTRODE REM PT RTRN 9FT ADLT (ELECTROSURGICAL) ×1 IMPLANT
GLOVE BIO SURGEON STRL SZ7.5 (GLOVE) ×2 IMPLANT
GLOVE BIOGEL PI IND STRL 8 (GLOVE) ×1 IMPLANT
GLOVE BIOGEL PI INDICATOR 8 (GLOVE) ×1
GOWN STRL REUS W/ TWL LRG LVL3 (GOWN DISPOSABLE) ×2 IMPLANT
GOWN STRL REUS W/ TWL XL LVL3 (GOWN DISPOSABLE) ×2 IMPLANT
GOWN STRL REUS W/TWL LRG LVL3 (GOWN DISPOSABLE) ×4
GOWN STRL REUS W/TWL XL LVL3 (GOWN DISPOSABLE) ×4
HEMOSTAT SPONGE AVITENE ULTRA (HEMOSTASIS) IMPLANT
KIT BASIN OR (CUSTOM PROCEDURE TRAY) ×2 IMPLANT
KIT TURNOVER KIT B (KITS) ×2 IMPLANT
NS IRRIG 1000ML POUR BTL (IV SOLUTION) ×2 IMPLANT
PACK CV ACCESS (CUSTOM PROCEDURE TRAY) ×2 IMPLANT
PAD ARMBOARD 7.5X6 YLW CONV (MISCELLANEOUS) ×4 IMPLANT
SUT MNCRL AB 4-0 PS2 18 (SUTURE) ×2 IMPLANT
SUT PROLENE 6 0 BV (SUTURE) ×2 IMPLANT
SUT PROLENE 7 0 BV 1 (SUTURE) IMPLANT
SUT VIC AB 3-0 SH 27 (SUTURE) ×2
SUT VIC AB 3-0 SH 27X BRD (SUTURE) ×1 IMPLANT
TOWEL GREEN STERILE (TOWEL DISPOSABLE) ×2 IMPLANT
UNDERPAD 30X30 (UNDERPADS AND DIAPERS) ×2 IMPLANT
WATER STERILE IRR 1000ML POUR (IV SOLUTION) ×2 IMPLANT

## 2019-05-06 NOTE — Anesthesia Postprocedure Evaluation (Signed)
Anesthesia Post Note  Patient: [name]  Procedure(s) Performed: LEFT BRACHIOCEPHALIC ARTERIOVENOUS (AV) FISTULA CREATION (Left )     Patient location during evaluation: PACU Anesthesia Type: MAC Level of consciousness: awake and alert Pain management: pain level controlled Vital Signs Assessment: post-procedure vital signs reviewed and stable Respiratory status: spontaneous breathing, nonlabored ventilation, respiratory function stable and patient connected to nasal cannula oxygen Cardiovascular status: stable and blood pressure returned to baseline Postop Assessment: no apparent nausea or vomiting Anesthetic complications: no    Last Vitals:  Vitals:   05/06/19 1217 05/06/19 1232  BP: 129/64 (!) 141/68  Pulse: (!) 50 (!) 59  Resp: (!) 29 19  Temp: (!) 35.8 C (!) 36.1 C  SpO2: 96% 96%    Last Pain:  Vitals:   05/06/19 1232  PainSc: 0-No pain                 Tiajuana Amass

## 2019-05-06 NOTE — Op Note (Signed)
OPERATIVE NOTE   PROCEDURE: left brachiocephalic arteriovenous fistula placement  PRE-OPERATIVE DIAGNOSIS: ESRD  POST-OPERATIVE DIAGNOSIS: same as above   SURGEON: Marty Heck, MD  ASSISTANT(S): Risa Grill, PA  ANESTHESIA: MAC  ESTIMATED BLOOD LOSS: <50 cc  FINDING(S): 1.  Cephalic vein: 4 mm, acceptable 2.  Brachial artery: 4 mm, disease free 3.  Venous outflow: palpable thrill  4.  Radial flow: palpable radial pulse  SPECIMEN(S):  none  INDICATIONS:   [name] is a 68 y.o. male who presented with failure of left radiocephalic AVF to mature and needs permanent hemodialysis access.  The patient is scheduled for left brachiocephalic arteriovenous fistula placement after recent fistulogram with Dr. Oneida Alar.  The patient is aware the risks include but are not limited to: bleeding, infection, steal syndrome, nerve damage, ischemic monomelic neuropathy, failure to mature, and need for additional procedures.  The patient is aware of the risks of the procedure and elects to proceed forward.   DESCRIPTION: After full informed written consent was obtained from the patient, the patient was brought back to the operating room and placed supine upon the operating table.  Prior to induction, the patient received IV antibiotics.   After obtaining adequate anesthesia, the patient was then prepped and draped in the standard fashion for a left arm access procedure.  I turned my attention first to identifying the patient's cephalic vein and brachial artery.  Using SonoSite guidance, the location of these vessels were marked out on the skin just below the elbow.     At this point, I injected local anesthetic to obtain a field block of the antecubitum.  In total, I injected about 8 mL of 1% lidocaine without epinephrine.  I made a transverse incision below the level of the antecubitum and dissected through the subcutaneous tissue and fascia to gain exposure of the brachial artery.   This was noted to be 4 mm in diameter externally.  This was dissected out proximally and distally and controlled with vessel loops .  I then dissected out the cephalic vein.  This was noted to be 4 mm in diameter externally.  The distal segment of the vein was ligated with a  2-0 silk, and the vein was transected.  The proximal segment was interrogated with serial dilators.  The vein accepted up to a 4 mm dilator without any difficulty.  I then instilled the heparinized saline into the vein and clamped it.  The patient was given 3000 units IV heparin.  At this point, I reset my exposure of the brachial artery and placed the artery under tension proximally and distally.  I made an arteriotomy with a #11 blade, and then I extended the arteriotomy with a Potts scissor.  I injected heparinized saline proximal and distal to this arteriotomy.  The vein was then sewn to the artery in an end-to-side configuration with a running stitch of 6-0 Prolene.  Prior to completing this anastomosis, I allowed the vein and artery to backbleed.  There was no evidence of clot from any vessels.  I completed the anastomosis in the usual fashion and then released all vessel loops and clamps.    There was a palpable thrill in the venous outflow, and there was a palpable radial pulse.  At this point, I irrigated out the surgical wound.  There was no further active bleeding.  The subcutaneous tissue was reapproximated with a running stitch of 3-0 Vicryl.  The skin was then reapproximated with a running  subcuticular stitch of 4-0 Monocryl.  The skin was then cleaned, dried, and reinforced with Dermabond.  The patient tolerated this procedure well.   COMPLICATIONS: None  CONDITION: Stable   Marty Heck, MD [facility] Office: [phone] Pager: [phone]  05/06/2019, 12:02 PM

## 2019-05-06 NOTE — H&P (Signed)
History and Physical Interval Note:  05/06/2019 10:15 AM  [name]  has presented today for surgery, with the diagnosis of END STAGE RENAL DISEASE.  The various methods of treatment have been discussed with the patient and family. After consideration of risks, benefits and other options for treatment, the patient has consented to  Procedure(s): LEFT BRACHIOCEPHALIC ARTERIOVENOUS (AV) FISTULA CREATION (Left) as a surgical intervention.  The patient's history has been reviewed, patient examined, no change in status, stable for surgery.  I have reviewed the patient's chart and labs.  Questions were answered to the patient's satisfaction.    Left arm AVF vs graft  [name]  Established Dialysis Access  History of Present Illness  [name] is a 68 y.o. ([birth year]) male who presents for re-evaluation of permanent access. Surgical history significant for right basilic vein fistula in October 2019 which was subsequently ligated due to steal syndrome. He then had left radiocephalic fistula creation by Dr. Donnetta Hutching in October 2019. He was recently started on hemodialysis. Left radiocephalic fistula was felt to be too small and thus right IJ TDC was placed for initiation of HD. He is dialyzing on a Tuesday Thursday Saturday schedule. He is not taking any blood thinners. He has a left-sided pacemaker.  The patient's PMH, PSH, SH, and FamHx were reviewed and are unchanged from prior visit.        Current Outpatient Medications  Medication Sig Dispense Refill  . acetaminophen (TYLENOL) 325 MG tablet Take 975 mg by mouth every morning.     Marland Kitchen albuterol (VENTOLIN HFA) 108 (90 BASE) MCG/ACT inhaler Inhale 2 puffs into the lungs every 6 (six) hours as needed for wheezing or shortness of breath.     Marland Kitchen atorvastatin (LIPITOR) 40 MG tablet Take 1 tablet (40 mg total) by mouth every evening. (Patient taking differently: Take 40 mg by mouth every morning. ) 90 tablet 2  . beclomethasone (QVAR) 80  MCG/ACT inhaler Inhale 2 puffs into the lungs 2 (two) times daily as needed (shortness of breath).    . carvedilol (COREG) 25 MG tablet TAKE ONE TABLET BY MOUTH TWICE DAILY. (Patient taking differently: Take 25 mg by mouth 2 (two) times daily with a meal. ) 180 tablet 2  . Cholecalciferol (VITAMIN D3) 2000 units TABS Take 2,000 Units by mouth every morning.     . cloNIDine (CATAPRES - DOSED IN MG/24 HR) 0.1 mg/24hr patch Place 1 patch (0.1 mg total) onto the skin once a week. 4 patch 12  . diltiazem (CARDIZEM) 30 MG tablet TAKE ONE TABLET BY MOUTH TWICE DAILY. (Patient taking differently: Take 30 mg by mouth 2 (two) times daily. ) 60 tablet 6  . epoetin alfa (EPOGEN) 4000 UNIT/ML injection Inject 4,000 Units into the vein See admin instructions. Infusion done as needed for hematocrit <10    . hydrALAZINE (APRESOLINE) 100 MG tablet TAKE ONE TABLET BY MOUTH THREE TIMES DAILY (Patient taking differently: Take 100-200 mg by mouth See admin instructions. Take two tablets (200 mg) by mouth every morning and one tablet (100 mg) at night) 270 tablet 2  . insulin glargine (LANTUS) 100 unit/mL SOPN Inject 35 Units into the skin at bedtime as needed (CBG >150).    . iron polysaccharides (NIFEREX) 150 MG capsule Take 150 mg by mouth every morning.    . linaclotide (LINZESS) 290 MCG CAPS capsule Take 290 mcg by mouth daily as needed (constipation).    . Melatonin 10 MG TABS Take 10 mg  by mouth at bedtime as needed (sleep).     . Multiple Vitamin (MULTIVITAMIN WITH MINERALS) TABS tablet Take 1 tablet by mouth every morning.     Marland Kitchen omeprazole (PRILOSEC) 20 MG capsule Take 20 mg by mouth every morning.     . polyethylene glycol (MIRALAX / GLYCOLAX) packet Take 17 g by mouth every morning. Mix in morning coffee    . potassium chloride SA (K-DUR,KLOR-CON) 20 MEQ tablet Take 1 tablet (20 mEq total) by mouth daily. (Patient taking differently: Take 20 mEq by mouth every morning. ) 30 tablet 0  . Sennosides 25 MG TABS Take  50 mg by mouth at bedtime.    . sildenafil (VIAGRA) 100 MG tablet Take 1 tablet (100 mg total) by mouth as needed for erectile dysfunction. (Patient taking differently: Take 100 mg by mouth daily as needed for erectile dysfunction. ) 10 tablet 0  . silver sulfADIAZINE (SILVADENE) 1 % cream Apply 1 application topically daily as needed (For wound care with dressing).     . torsemide (DEMADEX) 100 MG tablet Take 100 mg by mouth 2 (two) times daily.   4  . oxyCODONE-acetaminophen (PERCOCET) 5-325 MG tablet Take 1 tablet by mouth every 6 (six) hours as needed for severe pain. (Patient not taking: Reported on 04/23/2019) 8 tablet 0   No current facility-administered medications for this visit.    On ROS today: 10 system ROS negative unless otherwise noted in HPI  Physical Examination      Vitals:   04/23/19 1033  BP: 135/71  Pulse: 60  Resp: 18  Temp: (!) 97.3 F (36.3 C)  TempSrc: Temporal  SpO2: 97%  Weight: 231 lb (104.8 kg)  Height: 6\' 4"  (1.93 m)   Body mass index is 28.12 kg/m.  General Alert, O x 3, WD, NAD  Pulmonary Sym exp, good B air movt, CTA B  Cardiac RRR, Nl S1, S2  Vascular Vessel Right Left    Radial Palpable Palpable    Brachial Palpable Palpable    Ulnar Not palpable Not palpable   Musculo-  skeletal Symmetrical grip strength; easily palpable thrill near anastomosis however beyond distal forearm this is more difficult to feel; branching off of fistula also palpable in forearm  Neurologic A&O; CN grossly intact   Non-invasive Vascular Imaging  left Arm Access Duplex:  Patent left radiocephalic fistula with stenosis and distal forearm and low flow volume beyond this area of stenosis; diameter of fistula less than 6 mm in forearm  Medical Decision Making  [name] is a 68 y.o. male who presents with ESRD requiring hemodialysis.  Patent left radiocephalic fistula with area of stenosis in distal forearm  Plan will be for fistulogram with possible  intervention; I also discussed with the patient the possibility of sidebranch ligation based on fistulogram results  Continue HD via right IJ [facility] for now Risk, benefits, and alternatives to access surgery were discussed.  The patient is aware the risks include but are not limited to: bleeding, steal syndrome, thrombosis, failure to mature, and need for additional procedures.  The patient agrees to proceed with the procedure. Dagoberto Ligas PA-C  [facility]  Office: [phone]  Clinic MD: Donzetta Matters

## 2019-05-06 NOTE — Transfer of Care (Signed)
Immediate Anesthesia Transfer of Care Note  Patient: [name]  Procedure(s) Performed: LEFT BRACHIOCEPHALIC ARTERIOVENOUS (AV) FISTULA CREATION (Left )  Patient Location: PACU  Anesthesia Type:MAC  Level of Consciousness: awake, alert  and oriented  Airway & Oxygen Therapy: Patient Spontanous Breathing and Patient connected to face mask oxygen  Post-op Assessment: Report given to RN and Post -op Vital signs reviewed and stable  Post vital signs: Reviewed and stable  Last Vitals:  Vitals Value Taken Time  BP    Temp    Pulse    Resp    SpO2      Last Pain:  Vitals:   05/06/19 0802  PainSc: 0-No pain      Patients Stated Pain Goal: 4 (50/03/70 4888)  Complications: No apparent anesthesia complications

## 2019-05-06 NOTE — Discharge Instructions (Signed)
° °  [facility] ° °Discharge Instructions ° °AV Fistula or Graft Surgery for Dialysis Access ° °Please refer to the following instructions for your post-procedure care. Your surgeon or physician assistant will discuss any changes with you. ° °Activity ° °You may drive the day following your surgery, if you are comfortable and no longer taking prescription pain medication. Resume full activity as the soreness in your incision resolves. ° °Bathing/Showering ° °You may shower after you go home. Keep your incision dry for 48 hours. Do not soak in a bathtub, hot tub, or swim until the incision heals completely. You may not shower if you have a hemodialysis catheter. ° °Incision Care ° °Clean your incision with mild soap and water after 48 hours. Pat the area dry with a clean towel. You do not need a bandage unless otherwise instructed. Do not apply any ointments or creams to your incision. You may have skin glue on your incision. Do not peel it off. It will come off on its own in about one week. Your arm may swell a bit after surgery. To reduce swelling use pillows to elevate your arm so it is above your heart. Your doctor will tell you if you need to lightly wrap your arm with an ACE bandage. ° °Diet ° °Resume your normal diet. There are not special food restrictions following this procedure. In order to heal from your surgery, it is CRITICAL to get adequate nutrition. Your body requires vitamins, minerals, and protein. Vegetables are the best source of vitamins and minerals. Vegetables also provide the perfect balance of protein. Processed food has little nutritional value, so try to avoid this. ° °Medications ° °Resume taking all of your medications. If your incision is causing pain, you may take over-the counter pain relievers such as acetaminophen (Tylenol). If you were prescribed a stronger pain medication, please be aware these medications can cause nausea and constipation. Prevent  nausea by taking the medication with a snack or meal. Avoid constipation by drinking plenty of fluids and eating foods with high amount of fiber, such as fruits, vegetables, and grains. Do not take Tylenol if you are taking prescription pain medications. ° ° ° ° °Follow up °Your surgeon may want to see you in the office following your access surgery. If so, this will be arranged at the time of your surgery. ° °Please call us immediately for any of the following conditions: ° °Increased pain, redness, drainage (pus) from your incision site °Fever of 101 degrees or higher °Severe or worsening pain at your incision site °Hand pain or numbness. ° °Reduce your risk of vascular disease: ° °Stop smoking. If you would like help, call [facility] at 1-800-QUIT-NOW (1-800-784-8669) or [place] at 336-586-4000 ° °Manage your cholesterol °Maintain a desired weight °Control your diabetes °Keep your blood pressure down ° °Dialysis ° °It will take several weeks to several months for your new dialysis access to be ready for use. Your surgeon will determine when it is OK to use it. Your nephrologist will continue to direct your dialysis. You can continue to use your Permcath until your new access is ready for use. ° °If you have any questions, please call the office at [phone]. ° °

## 2019-05-06 NOTE — Anesthesia Procedure Notes (Signed)
Performed by: Shakala Marlatt C., CRNA       

## 2019-05-07 DIAGNOSIS — Z23 Encounter for immunization: Secondary | ICD-10-CM | POA: Diagnosis not present

## 2019-05-07 DIAGNOSIS — Z992 Dependence on renal dialysis: Secondary | ICD-10-CM | POA: Diagnosis not present

## 2019-05-07 DIAGNOSIS — E559 Vitamin D deficiency, unspecified: Secondary | ICD-10-CM | POA: Diagnosis not present

## 2019-05-07 DIAGNOSIS — N186 End stage renal disease: Secondary | ICD-10-CM | POA: Diagnosis not present

## 2019-05-07 DIAGNOSIS — D509 Iron deficiency anemia, unspecified: Secondary | ICD-10-CM | POA: Diagnosis not present

## 2019-05-10 DIAGNOSIS — D509 Iron deficiency anemia, unspecified: Secondary | ICD-10-CM | POA: Diagnosis not present

## 2019-05-10 DIAGNOSIS — N186 End stage renal disease: Secondary | ICD-10-CM | POA: Diagnosis not present

## 2019-05-10 DIAGNOSIS — Z23 Encounter for immunization: Secondary | ICD-10-CM | POA: Diagnosis not present

## 2019-05-10 DIAGNOSIS — Z992 Dependence on renal dialysis: Secondary | ICD-10-CM | POA: Diagnosis not present

## 2019-05-10 DIAGNOSIS — E559 Vitamin D deficiency, unspecified: Secondary | ICD-10-CM | POA: Diagnosis not present

## 2019-05-11 ENCOUNTER — Other Ambulatory Visit: Payer: Self-pay | Admitting: Nephrology

## 2019-05-11 ENCOUNTER — Other Ambulatory Visit (HOSPITAL_COMMUNITY): Payer: Self-pay | Admitting: Nephrology

## 2019-05-11 DIAGNOSIS — R109 Unspecified abdominal pain: Secondary | ICD-10-CM

## 2019-05-12 ENCOUNTER — Ambulatory Visit (HOSPITAL_COMMUNITY)
Admission: RE | Admit: 2019-05-12 | Discharge: 2019-05-12 | Disposition: A | Discharge status: Home / Self Care | Payer: Medicare Other | Source: Ambulatory Visit | Attending: Nephrology | Admitting: Nephrology

## 2019-05-12 ENCOUNTER — Other Ambulatory Visit: Payer: Self-pay

## 2019-05-12 DIAGNOSIS — Z23 Encounter for immunization: Secondary | ICD-10-CM | POA: Diagnosis not present

## 2019-05-12 DIAGNOSIS — R109 Unspecified abdominal pain: Secondary | ICD-10-CM | POA: Diagnosis present

## 2019-05-12 DIAGNOSIS — Z992 Dependence on renal dialysis: Secondary | ICD-10-CM | POA: Diagnosis not present

## 2019-05-12 DIAGNOSIS — E559 Vitamin D deficiency, unspecified: Secondary | ICD-10-CM | POA: Diagnosis not present

## 2019-05-12 DIAGNOSIS — N186 End stage renal disease: Secondary | ICD-10-CM | POA: Diagnosis not present

## 2019-05-12 DIAGNOSIS — D509 Iron deficiency anemia, unspecified: Secondary | ICD-10-CM | POA: Diagnosis not present

## 2019-05-12 MED ORDER — IOHEXOL 350 MG/ML SOLN
100.0000 mL | Freq: Once | INTRAVENOUS | Status: AC | PRN
  Administered 2019-05-12: 100 mL via INTRAVENOUS

## 2019-05-15 DIAGNOSIS — E559 Vitamin D deficiency, unspecified: Secondary | ICD-10-CM | POA: Diagnosis not present

## 2019-05-15 DIAGNOSIS — D509 Iron deficiency anemia, unspecified: Secondary | ICD-10-CM | POA: Diagnosis not present

## 2019-05-15 DIAGNOSIS — Z23 Encounter for immunization: Secondary | ICD-10-CM | POA: Diagnosis not present

## 2019-05-15 DIAGNOSIS — Z992 Dependence on renal dialysis: Secondary | ICD-10-CM | POA: Diagnosis not present

## 2019-05-15 DIAGNOSIS — N186 End stage renal disease: Secondary | ICD-10-CM | POA: Diagnosis not present

## 2019-05-17 DIAGNOSIS — Z992 Dependence on renal dialysis: Secondary | ICD-10-CM | POA: Diagnosis not present

## 2019-05-17 DIAGNOSIS — D509 Iron deficiency anemia, unspecified: Secondary | ICD-10-CM | POA: Diagnosis not present

## 2019-05-17 DIAGNOSIS — Z23 Encounter for immunization: Secondary | ICD-10-CM | POA: Diagnosis not present

## 2019-05-17 DIAGNOSIS — N186 End stage renal disease: Secondary | ICD-10-CM | POA: Diagnosis not present

## 2019-05-17 DIAGNOSIS — E559 Vitamin D deficiency, unspecified: Secondary | ICD-10-CM | POA: Diagnosis not present

## 2019-05-18 ENCOUNTER — Telehealth: Payer: Self-pay

## 2019-05-19 DIAGNOSIS — D509 Iron deficiency anemia, unspecified: Secondary | ICD-10-CM | POA: Diagnosis not present

## 2019-05-19 DIAGNOSIS — E559 Vitamin D deficiency, unspecified: Secondary | ICD-10-CM | POA: Diagnosis not present

## 2019-05-19 DIAGNOSIS — Z992 Dependence on renal dialysis: Secondary | ICD-10-CM | POA: Diagnosis not present

## 2019-05-19 DIAGNOSIS — Z23 Encounter for immunization: Secondary | ICD-10-CM | POA: Diagnosis not present

## 2019-05-19 DIAGNOSIS — N186 End stage renal disease: Secondary | ICD-10-CM | POA: Diagnosis not present

## 2019-05-19 NOTE — Telephone Encounter (Signed)
On 05/18/19 at 1:23 pm, [name] with Dr. 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  She reported that the AAA was now 3.7 cm.  I relayed the message to Dr. Carlis Abbott.  He reviewed the 12.23.20 CT images.  I understood him to say the following information could be given to Dr. Toya Smothers office:  (1) The AAA had grown 3 mm since 2018. (2) There was nothing in the images that appeared to be the cause of his symptoms. (3) Dr. Carlis Abbott would be happy to discuss his AAA results with him during his 06/14/18 dialysis post-op.  On 06/08/18 at 10:01, I spoke to [name] and relayed the information.  She voiced understanding and told me that they were continuing to pursue a GI referral for the patient.  [name]

## 2019-05-21 DIAGNOSIS — D509 Iron deficiency anemia, unspecified: Secondary | ICD-10-CM | POA: Diagnosis not present

## 2019-05-21 DIAGNOSIS — E559 Vitamin D deficiency, unspecified: Secondary | ICD-10-CM | POA: Diagnosis not present

## 2019-05-21 DIAGNOSIS — Z992 Dependence on renal dialysis: Secondary | ICD-10-CM | POA: Diagnosis not present

## 2019-05-21 DIAGNOSIS — N186 End stage renal disease: Secondary | ICD-10-CM | POA: Diagnosis not present

## 2019-05-21 DIAGNOSIS — Z23 Encounter for immunization: Secondary | ICD-10-CM | POA: Diagnosis not present

## 2019-05-24 DIAGNOSIS — E559 Vitamin D deficiency, unspecified: Secondary | ICD-10-CM | POA: Diagnosis not present

## 2019-05-24 DIAGNOSIS — N186 End stage renal disease: Secondary | ICD-10-CM | POA: Diagnosis not present

## 2019-05-24 DIAGNOSIS — Z992 Dependence on renal dialysis: Secondary | ICD-10-CM | POA: Diagnosis not present

## 2019-05-24 DIAGNOSIS — D509 Iron deficiency anemia, unspecified: Secondary | ICD-10-CM | POA: Diagnosis not present

## 2019-05-24 DIAGNOSIS — Z23 Encounter for immunization: Secondary | ICD-10-CM | POA: Diagnosis not present

## 2019-05-26 DIAGNOSIS — N186 End stage renal disease: Secondary | ICD-10-CM | POA: Diagnosis not present

## 2019-05-26 DIAGNOSIS — Z23 Encounter for immunization: Secondary | ICD-10-CM | POA: Diagnosis not present

## 2019-05-26 DIAGNOSIS — D509 Iron deficiency anemia, unspecified: Secondary | ICD-10-CM | POA: Diagnosis not present

## 2019-05-26 DIAGNOSIS — Z992 Dependence on renal dialysis: Secondary | ICD-10-CM | POA: Diagnosis not present

## 2019-05-26 DIAGNOSIS — E559 Vitamin D deficiency, unspecified: Secondary | ICD-10-CM | POA: Diagnosis not present

## 2019-05-28 DIAGNOSIS — E559 Vitamin D deficiency, unspecified: Secondary | ICD-10-CM | POA: Diagnosis not present

## 2019-05-28 DIAGNOSIS — N186 End stage renal disease: Secondary | ICD-10-CM | POA: Diagnosis not present

## 2019-05-28 DIAGNOSIS — Z23 Encounter for immunization: Secondary | ICD-10-CM | POA: Diagnosis not present

## 2019-05-28 DIAGNOSIS — Z992 Dependence on renal dialysis: Secondary | ICD-10-CM | POA: Diagnosis not present

## 2019-05-28 DIAGNOSIS — D509 Iron deficiency anemia, unspecified: Secondary | ICD-10-CM | POA: Diagnosis not present

## 2019-05-31 DIAGNOSIS — Z23 Encounter for immunization: Secondary | ICD-10-CM | POA: Diagnosis not present

## 2019-05-31 DIAGNOSIS — D509 Iron deficiency anemia, unspecified: Secondary | ICD-10-CM | POA: Diagnosis not present

## 2019-05-31 DIAGNOSIS — N186 End stage renal disease: Secondary | ICD-10-CM | POA: Diagnosis not present

## 2019-05-31 DIAGNOSIS — Z992 Dependence on renal dialysis: Secondary | ICD-10-CM | POA: Diagnosis not present

## 2019-05-31 DIAGNOSIS — E559 Vitamin D deficiency, unspecified: Secondary | ICD-10-CM | POA: Diagnosis not present

## 2019-06-02 ENCOUNTER — Telehealth: Payer: Self-pay

## 2019-06-02 DIAGNOSIS — N186 End stage renal disease: Secondary | ICD-10-CM | POA: Diagnosis not present

## 2019-06-02 DIAGNOSIS — Z992 Dependence on renal dialysis: Secondary | ICD-10-CM | POA: Diagnosis not present

## 2019-06-02 DIAGNOSIS — Z4902 Encounter for fitting and adjustment of peritoneal dialysis catheter: Secondary | ICD-10-CM | POA: Diagnosis not present

## 2019-06-02 DIAGNOSIS — K551 Chronic vascular disorders of intestine: Secondary | ICD-10-CM | POA: Diagnosis not present

## 2019-06-02 DIAGNOSIS — N2889 Other specified disorders of kidney and ureter: Secondary | ICD-10-CM | POA: Diagnosis not present

## 2019-06-02 NOTE — Telephone Encounter (Signed)
Wife called and said that pt is having a lot of stomach discomfort and was asking about the notes from the Dr in [place] that they saw regarding surgery.   Advised her that I did not have the notes in the computer from their office to advise on this but I do see that Delsa Sale had spoken with someone regarding a referral for the AAA. Pts wife will call and have records sent to the office and advised her if his pain gets severe to go to the ER for evaluation.   York Cerise, CMA

## 2019-06-03 DIAGNOSIS — N186 End stage renal disease: Secondary | ICD-10-CM | POA: Diagnosis not present

## 2019-06-03 DIAGNOSIS — Z992 Dependence on renal dialysis: Secondary | ICD-10-CM | POA: Diagnosis not present

## 2019-06-03 DIAGNOSIS — Z23 Encounter for immunization: Secondary | ICD-10-CM | POA: Diagnosis not present

## 2019-06-03 DIAGNOSIS — D509 Iron deficiency anemia, unspecified: Secondary | ICD-10-CM | POA: Diagnosis not present

## 2019-06-03 DIAGNOSIS — E559 Vitamin D deficiency, unspecified: Secondary | ICD-10-CM | POA: Diagnosis not present

## 2019-06-04 DIAGNOSIS — Z992 Dependence on renal dialysis: Secondary | ICD-10-CM | POA: Diagnosis not present

## 2019-06-04 DIAGNOSIS — D509 Iron deficiency anemia, unspecified: Secondary | ICD-10-CM | POA: Diagnosis not present

## 2019-06-04 DIAGNOSIS — E559 Vitamin D deficiency, unspecified: Secondary | ICD-10-CM | POA: Diagnosis not present

## 2019-06-04 DIAGNOSIS — N186 End stage renal disease: Secondary | ICD-10-CM | POA: Diagnosis not present

## 2019-06-04 DIAGNOSIS — Z23 Encounter for immunization: Secondary | ICD-10-CM | POA: Diagnosis not present

## 2019-06-07 DIAGNOSIS — N186 End stage renal disease: Secondary | ICD-10-CM | POA: Diagnosis not present

## 2019-06-07 DIAGNOSIS — Z23 Encounter for immunization: Secondary | ICD-10-CM | POA: Diagnosis not present

## 2019-06-07 DIAGNOSIS — D509 Iron deficiency anemia, unspecified: Secondary | ICD-10-CM | POA: Diagnosis not present

## 2019-06-07 DIAGNOSIS — Z992 Dependence on renal dialysis: Secondary | ICD-10-CM | POA: Diagnosis not present

## 2019-06-07 DIAGNOSIS — E559 Vitamin D deficiency, unspecified: Secondary | ICD-10-CM | POA: Diagnosis not present

## 2019-06-08 ENCOUNTER — Other Ambulatory Visit: Payer: Self-pay

## 2019-06-08 DIAGNOSIS — Z20828 Contact with and (suspected) exposure to other viral communicable diseases: Secondary | ICD-10-CM | POA: Diagnosis not present

## 2019-06-08 DIAGNOSIS — N186 End stage renal disease: Secondary | ICD-10-CM | POA: Diagnosis not present

## 2019-06-08 DIAGNOSIS — R519 Headache, unspecified: Secondary | ICD-10-CM | POA: Diagnosis not present

## 2019-06-08 DIAGNOSIS — R112 Nausea with vomiting, unspecified: Secondary | ICD-10-CM | POA: Diagnosis not present

## 2019-06-08 DIAGNOSIS — R109 Unspecified abdominal pain: Secondary | ICD-10-CM | POA: Diagnosis not present

## 2019-06-09 ENCOUNTER — Ambulatory Visit (HOSPITAL_COMMUNITY)
Admission: RE | Admit: 2019-06-09 | Discharge: 2019-06-09 | Disposition: A | Discharge status: Home / Self Care | Payer: Medicare Other | Source: Ambulatory Visit | Attending: Vascular Surgery | Admitting: Vascular Surgery

## 2019-06-09 ENCOUNTER — Ambulatory Visit (INDEPENDENT_AMBULATORY_CARE_PROVIDER_SITE_OTHER): Payer: Self-pay | Admitting: Physician Assistant

## 2019-06-09 ENCOUNTER — Other Ambulatory Visit: Payer: Self-pay

## 2019-06-09 ENCOUNTER — Ambulatory Visit (INDEPENDENT_AMBULATORY_CARE_PROVIDER_SITE_OTHER): Payer: Medicare Other | Admitting: Physician Assistant

## 2019-06-09 VITALS — BP 138/60 | HR 60 | Temp 96.8°F | Ht 76.0 in | Wt 229.0 lb

## 2019-06-09 DIAGNOSIS — R1013 Epigastric pain: Secondary | ICD-10-CM | POA: Diagnosis not present

## 2019-06-09 DIAGNOSIS — N186 End stage renal disease: Secondary | ICD-10-CM | POA: Diagnosis not present

## 2019-06-09 DIAGNOSIS — G8929 Other chronic pain: Secondary | ICD-10-CM | POA: Diagnosis not present

## 2019-06-09 NOTE — Progress Notes (Signed)
POST OPERATIVE OFFICE NOTE    CC:  F/u for surgery  HPI:  This is a 69 y.o. male who is s/p left brachiocephalic arteriovenous fistula created on May 06, 2019.He denies hand pain, numbness, or tingling HD via right IJ catheter without complications No Known Allergies  Current Outpatient Medications  Medication Sig Dispense Refill  . acetaminophen (TYLENOL) 325 MG tablet Take 975 mg by mouth every morning.     Marland Kitchen albuterol (VENTOLIN HFA) 108 (90 BASE) MCG/ACT inhaler Inhale 2 puffs into the lungs every 6 (six) hours as needed for wheezing or shortness of breath.     Marland Kitchen atorvastatin (LIPITOR) 40 MG tablet Take 1 tablet (40 mg total) by mouth every evening. (Patient taking differently: Take 40 mg by mouth every morning. ) 90 tablet 2  . beclomethasone (QVAR) 80 MCG/ACT inhaler Inhale 2 puffs into the lungs 2 (two) times daily as needed (shortness of breath).    Marland Kitchen buPROPion (WELLBUTRIN SR) 150 MG 12 hr tablet Take 150 mg by mouth 2 (two) times daily.    . carvedilol (COREG) 25 MG tablet TAKE ONE TABLET BY MOUTH TWICE DAILY. (Patient taking differently: Take 25 mg by mouth 2 (two) times daily with a meal. ) 180 tablet 2  . Cholecalciferol (VITAMIN D3) 2000 units TABS Take 2,000 Units by mouth every morning.     . diltiazem (CARDIZEM) 30 MG tablet TAKE ONE TABLET BY MOUTH TWICE DAILY. (Patient taking differently: Take 30 mg by mouth 2 (two) times daily. ) 60 tablet 6  . epoetin alfa (EPOGEN) 4000 UNIT/ML injection Inject 4,000 Units into the vein See admin instructions. Infusion done as needed for hematocrit <10    . hydrALAZINE (APRESOLINE) 100 MG tablet TAKE ONE TABLET BY MOUTH THREE TIMES DAILY (Patient taking differently: Take 100-200 mg by mouth See admin instructions. Take two tablets (200 mg) by mouth every morning and one tablet (100 mg) at night) 270 tablet 2  . iron polysaccharides (NIFEREX) 150 MG capsule Take 150 mg by mouth every morning.    . linaclotide (LINZESS) 290 MCG CAPS  capsule Take 290 mcg by mouth daily as needed (constipation).    . Melatonin 10 MG TABS Take 10 mg by mouth at bedtime as needed (sleep).     . Multiple Vitamin (MULTIVITAMIN WITH MINERALS) TABS tablet Take 1 tablet by mouth every morning.     Marland Kitchen omeprazole (PRILOSEC) 20 MG capsule Take 20 mg by mouth every morning.     Marland Kitchen oxyCODONE-acetaminophen (PERCOCET) 5-325 MG tablet Take 1 tablet by mouth every 6 (six) hours as needed for severe pain. 8 tablet 0  . polyethylene glycol (MIRALAX / GLYCOLAX) packet Take 17 g by mouth every morning. Mix in morning coffee    . potassium chloride SA (K-DUR,KLOR-CON) 20 MEQ tablet Take 1 tablet (20 mEq total) by mouth daily. (Patient taking differently: Take 20 mEq by mouth every morning. ) 30 tablet 0  . Sennosides 25 MG TABS Take 50 mg by mouth at bedtime.    . sildenafil (VIAGRA) 100 MG tablet Take 1 tablet (100 mg total) by mouth as needed for erectile dysfunction. (Patient taking differently: Take 100 mg by mouth daily as needed for erectile dysfunction. ) 10 tablet 0  . silver sulfADIAZINE (SILVADENE) 1 % cream Apply 1 application topically daily as needed (For wound care with dressing).      No current facility-administered medications for this visit.     ROS:  See HPI  Physical Exam:  DIALYSIS ACCESS  Reason for Exam: Routine follow up.  Access Site: Left Upper Extremity.  Access Type: Brachial-cephalic AVF placed 85/06/7739 .  Performing Technologist: Alvia Grove RVT    Examination Guidelines: A complete evaluation includes B-mode imaging, spectral Doppler, color Doppler, and power Doppler as needed of all accessible portions of each vessel. Unilateral testing is considered an integral part of a complete examination. Limited examinations for reoccurring indications may be performed as noted.    Findings: +--------------------+----------+-----------------+--------+ AVF                 PSV (cm/s)Flow Vol  (mL/min)Comments +--------------------+----------+-----------------+--------+ Native artery inflow   331          2158                +--------------------+----------+-----------------+--------+ AVF Anastomosis        691                              +--------------------+----------+-----------------+--------+    +------------+----------+-------------+----------+---------------------+ OUTFLOW VEINPSV (cm/s)Diameter (cm)Depth (cm)      Describe        +------------+----------+-------------+----------+---------------------+ Shoulder       138        0.77        1.40                         +------------+----------+-------------+----------+---------------------+ Prox UA        165        0.66        0.63                         +------------+----------+-------------+----------+---------------------+ Mid UA         180        0.66        0.22                         +------------+----------+-------------+----------+---------------------+ Dist UA        178        0.65        0.17   competing branch 0.23 +------------+----------+-------------+----------+---------------------+ AC Fossa       246        1.15        0.21                         +------------+----------+-------------+----------+---------------------+       Summary: Patent left Brachial-cephalic fistula with no visualized stenosis.   *See table(s) above for measurements and observations.   Diagnosing physician: Deitra Mayo MD     Incision:  Well healed Extremities:  Fistula is palpable along its course. Good thrill and bruit. 5/5 grip strength. 2+ radial pulse Neuro: A and O x4   Assessment/Plan:  This is a 69 y.o. male who is s/p: left b-c AVF. May begin cannulating fistula on March 17th, 2021   Jannet Mantis, [facility] [phone]  Clinic MD:  Scot Dock

## 2019-06-10 DIAGNOSIS — D509 Iron deficiency anemia, unspecified: Secondary | ICD-10-CM | POA: Diagnosis not present

## 2019-06-10 DIAGNOSIS — N186 End stage renal disease: Secondary | ICD-10-CM | POA: Diagnosis not present

## 2019-06-10 DIAGNOSIS — Z992 Dependence on renal dialysis: Secondary | ICD-10-CM | POA: Diagnosis not present

## 2019-06-10 DIAGNOSIS — Z23 Encounter for immunization: Secondary | ICD-10-CM | POA: Diagnosis not present

## 2019-06-10 DIAGNOSIS — E559 Vitamin D deficiency, unspecified: Secondary | ICD-10-CM | POA: Diagnosis not present

## 2019-06-11 DIAGNOSIS — Z992 Dependence on renal dialysis: Secondary | ICD-10-CM | POA: Diagnosis not present

## 2019-06-11 DIAGNOSIS — D509 Iron deficiency anemia, unspecified: Secondary | ICD-10-CM | POA: Diagnosis not present

## 2019-06-11 DIAGNOSIS — Z23 Encounter for immunization: Secondary | ICD-10-CM | POA: Diagnosis not present

## 2019-06-11 DIAGNOSIS — N186 End stage renal disease: Secondary | ICD-10-CM | POA: Diagnosis not present

## 2019-06-11 DIAGNOSIS — E559 Vitamin D deficiency, unspecified: Secondary | ICD-10-CM | POA: Diagnosis not present

## 2019-06-14 DIAGNOSIS — E559 Vitamin D deficiency, unspecified: Secondary | ICD-10-CM | POA: Diagnosis not present

## 2019-06-14 DIAGNOSIS — D509 Iron deficiency anemia, unspecified: Secondary | ICD-10-CM | POA: Diagnosis not present

## 2019-06-14 DIAGNOSIS — Z992 Dependence on renal dialysis: Secondary | ICD-10-CM | POA: Diagnosis not present

## 2019-06-14 DIAGNOSIS — Z23 Encounter for immunization: Secondary | ICD-10-CM | POA: Diagnosis not present

## 2019-06-14 DIAGNOSIS — L601 Onycholysis: Secondary | ICD-10-CM | POA: Diagnosis not present

## 2019-06-14 DIAGNOSIS — L97512 Non-pressure chronic ulcer of other part of right foot with fat layer exposed: Secondary | ICD-10-CM | POA: Diagnosis not present

## 2019-06-14 DIAGNOSIS — E1142 Type 2 diabetes mellitus with diabetic polyneuropathy: Secondary | ICD-10-CM | POA: Diagnosis not present

## 2019-06-14 DIAGNOSIS — N186 End stage renal disease: Secondary | ICD-10-CM | POA: Diagnosis not present

## 2019-06-15 ENCOUNTER — Ambulatory Visit (HOSPITAL_COMMUNITY): Payer: Medicare Other

## 2019-06-15 ENCOUNTER — Encounter: Payer: Medicare Other | Admitting: Vascular Surgery

## 2019-06-15 NOTE — Progress Notes (Signed)
Consult    Reason for Consult:  Abdominal pain, r/o mesenteric ischemia Requesting Physician:  Consuello Masse, MD Dr. Theador Hawthorne MRN #:  [id number]  History of Present Illness: This is a 69 y.o. male with a history of chronic kidney which advanced to Stage 5 last fall.  He initiated hemodialysis around thanksgiving of 2020.  Since beginning HD, he has noticed abdominal pain, nausea with occasional vomiting. Onset is with dialysis treatment, persists through the day and into the next day and evening.  Symptoms are nearly gone by the next treatment day. The pain in generalized, starts in the mid abdomen and will sometime radiate into his left neck.  He denies syncope or pressure-like chest pain, SOB or diaphoresis. He denies hematemesis, post-prandial pain or food fear.  He denies fever or chills.  He was hospitalized with volume over-load and reported weight gain due volume.  Since initiating dialysis, he has lost weight. He is s/p cholecystectomy. Colonscopy and EGD performed in June, 2020. Findings included single angioplastic cecal polyp and gastric polyp (biopsied).  He underwent CTA of abd and pelvis and this was reviewed by Dr. Carlis Abbott on 05/18/2020.  Follow-up office visit arranged.  The pt atorvastatin on a statin for cholesterol management.  The pt not on a daily aspirin.   Other AC:  none The pt is on carvedilol, diltiazem, hydralazine for hypertension.   The pt is diabetic.  Insulin requiring Tobacco hx:  40 pack year  Past Medical History:  Diagnosis Date  . AAA (abdominal aortic aneurysm) ([place]) 06/2016   Mild increase in size of 3.4 cm infrarenal abdominal aortic aneurysm  . Anemia   . Aortic atherosclerosis ([place])   . Asthma   . Atrial flutter ([place])   . AVF (arteriovenous fistula) ([facility])    Left forearm  . CAD (coronary artery disease)    a. s/p prior stenting of RCA in 1999 b. cath in 2003 showing moderate CAD c. NST in 2016 showing small area of ischemic and low-risk  . CHF  (congestive heart failure) ([place])   . CKD (chronic kidney disease), stage V ([place])    kidney transplant evaluation  . COPD (chronic obstructive pulmonary disease) ([place])    with ongoing tobacco use and patient failed sham takes  . Diverticulosis 2018   Mild sigmoid colon diverticulosis.  Marland Kitchen DM2 (diabetes mellitus, type 2) ([place])   . Dyslipidemia   . Dysrhythmia 2018   atrial fibrillation  . Edema    chronic lower extremity secondary to right heart failure and chronic venous insufficiency  . Fatigue   . Fatty liver 2010   Mild  . Headache   . HTN (hypertension)   . Morbid obesity ([place])   . Multiple pulmonary nodules 05/2018   Bilateral  . Pneumonia   . Presence of permanent cardiac pacemaker   . 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   Marland Kitchen Reflux esophagitis    hx  . Sleep apnea    PT STATES ONE TEST WAS POSITIVE AND ANOTHER TEST WAS NEGATIVE  . Two-vessel coronary artery disease    moderate. by cath in 2003. Status post stenting of the mdi RCA November 1999 normal left ventricular ejection fraction  . Wears glasses   . Wears hearing aid    B/L    Past Surgical History:  Procedure Laterality Date  . A/V FISTULAGRAM Left 04/30/2019   Procedure: A/V FISTULAGRAM;  Surgeon: Elam Dutch, MD;  Location: [facility];  Service: Cardiovascular;  Laterality: Left;  . ABDOMINAL SURGERY    . APPENDECTOMY    . AV FISTULA PLACEMENT Right 03/11/2018   Procedure: CREATION Brachiocephalic Fistula RIGHT ARM;  Surgeon: Rosetta Posner, MD;  Location: [facility];  Service: Vascular;  Laterality: Right;  . AV FISTULA PLACEMENT Left 05/06/2019   Procedure: LEFT BRACHIOCEPHALIC ARTERIOVENOUS (AV) FISTULA CREATION;  Surgeon: Marty Heck, MD;  Location: [place];  Service: Vascular;  Laterality: Left;  . BACK SURGERY     x3; cages in patient's back since 2000  . BIOPSY  11/12/2018   Procedure: BIOPSY;  Surgeon: Laurence Spates, MD;   Location: [facility];  Service: Endoscopy;;  . CARDIAC CATHETERIZATION     stent  . CHOLECYSTECTOMY    . CLOSED REDUCTION SHOULDER DISLOCATION    . COLONOSCOPY W/ BIOPSIES AND POLYPECTOMY    . COLONOSCOPY WITH PROPOFOL N/A 11/12/2018   Procedure: COLONOSCOPY WITH PROPOFOL;  Surgeon: Laurence Spates, MD;  Location: [facility];  Service: Endoscopy;  Laterality: N/A;  . CORONARY ANGIOPLASTY    . diabetic ulcers    . DIAGNOSTIC LAPAROSCOPY    . ESOPHAGOGASTRODUODENOSCOPY (EGD) WITH PROPOFOL N/A 11/12/2018   Procedure: ESOPHAGOGASTRODUODENOSCOPY (EGD) WITH PROPOFOL;  Surgeon: Laurence Spates, MD;  Location: [facility];  Service: Endoscopy;  Laterality: N/A;  . GROIN EXPLORATION    . HEMOSTASIS CLIP PLACEMENT  11/12/2018   Procedure: HEMOSTASIS CLIP PLACEMENT;  Surgeon: Laurence Spates, MD;  Location: [facility];  Service: Endoscopy;;  . INSERT / REPLACE / REMOVE PACEMAKER    . INSERTION OF ARTERIOVENOUS (AV) ARTEGRAFT ARM Left 03/18/2018   Procedure: INSERTION OF ARTERIOVENOUS (AV) FISTULA;  Surgeon: Rosetta Posner, MD;  Location: [place];  Service: Vascular;  Laterality: Left;  . IR FLUORO GUIDE CV LINE RIGHT  04/11/2019  . IR US GUIDE VASC ACCESS RIGHT  04/11/2019  . LIGATION ARTERIOVENOUS GORTEX GRAFT Right 03/18/2018   Procedure: LIGATION ARTERIOVENOUS FISTULA;  Surgeon: Rosetta Posner, MD;  Location: [facility];  Service: Vascular;  Laterality: Right;  . OSTECTOMY Left 04/23/2017   Procedure: OSTECTOMY LEFT GREAT TOE;  Surgeon: [name], DPM;  Location: [facility];  Service: Podiatry;  Laterality: Left;  left great toe  . POLYPECTOMY  11/12/2018   Procedure: POLYPECTOMY;  Surgeon: Laurence Spates, MD;  Location: [facility];  Service: Endoscopy;;  . TUMOR REMOVAL     small intestine x3    No Known Allergies  Prior to Admission medications   Medication Sig Start Date End Date Taking? Authorizing Provider  acetaminophen (TYLENOL) 325 MG tablet Take 975 mg by mouth every morning.      [provider]  albuterol (VENTOLIN HFA) 108 (90 BASE) MCG/ACT inhaler Inhale 2 puffs into the lungs every 6 (six) hours as needed for wheezing or shortness of breath.     [provider]  atorvastatin (LIPITOR) 40 MG tablet Take 1 tablet (40 mg total) by mouth every evening. Patient taking differently: Take 40 mg by mouth every morning.  08/24/18   Arnoldo Lenis, MD  beclomethasone (QVAR) 80 MCG/ACT inhaler Inhale 2 puffs into the lungs 2 (two) times daily as needed (shortness of breath).    [provider]  buPROPion (WELLBUTRIN SR) 150 MG 12 hr tablet Take 150 mg by mouth 2 (two) times daily.    [provider]  carvedilol (COREG) 25 MG tablet TAKE ONE TABLET BY MOUTH TWICE DAILY. Patient taking differently: Take 25 mg by mouth  2 (two) times daily with a meal.  02/15/19   Branch, Alphonse Guild, MD  Cholecalciferol (VITAMIN D3) 2000 units TABS Take 2,000 Units by mouth every morning.     [provider]  diltiazem (CARDIZEM) 30 MG tablet TAKE ONE TABLET BY MOUTH TWICE DAILY. Patient taking differently: Take 30 mg by mouth 2 (two) times daily.  02/15/19   Arnoldo Lenis, MD  epoetin alfa (EPOGEN) 4000 UNIT/ML injection Inject 4,000 Units into the vein See admin instructions. Infusion done as needed for hematocrit <10 09/08/18   Fran Lowes, MD  hydrALAZINE (APRESOLINE) 100 MG tablet TAKE ONE TABLET BY MOUTH THREE TIMES DAILY Patient taking differently: Take 100-200 mg by mouth See admin instructions. Take two tablets (200 mg) by mouth every morning and one tablet (100 mg) at night 02/15/19   Arnoldo Lenis, MD  iron polysaccharides (NIFEREX) 150 MG capsule Take 150 mg by mouth every morning.    [provider]  linaclotide (LINZESS) 290 MCG CAPS capsule Take 290 mcg by mouth daily as needed (constipation).    [provider]  Melatonin 10 MG TABS Take 10 mg by mouth at bedtime as needed (sleep).     [provider]    Multiple Vitamin (MULTIVITAMIN WITH MINERALS) TABS tablet Take 1 tablet by mouth every morning.     [provider]  omeprazole (PRILOSEC) 20 MG capsule Take 20 mg by mouth every morning.     [provider]  oxyCODONE-acetaminophen (PERCOCET) 5-325 MG tablet Take 1 tablet by mouth every 6 (six) hours as needed for severe pain. 05/06/19 05/05/20  Ulyses Amor, PA-C  polyethylene glycol (MIRALAX / GLYCOLAX) packet Take 17 g by mouth every morning. Mix in morning coffee    [provider]  potassium chloride SA (K-DUR,KLOR-CON) 20 MEQ tablet Take 1 tablet (20 mEq total) by mouth daily. Patient taking differently: Take 20 mEq by mouth every morning.  05/10/17   Orson Eva, MD  Sennosides 25 MG TABS Take 50 mg by mouth at bedtime.    [provider]  sildenafil (VIAGRA) 100 MG tablet Take 1 tablet (100 mg total) by mouth as needed for erectile dysfunction. Patient taking differently: Take 100 mg by mouth daily as needed for erectile dysfunction.  01/23/18   Allred, Jeneen Rinks, MD  silver sulfADIAZINE (SILVADENE) 1 % cream Apply 1 application topically daily as needed (For wound care with dressing).     [provider]    Social History   Socioeconomic History  . Marital status: Married    Spouse name: Not on file  . Number of children: Not on file  . Years of education: Not on file  . Highest education level: Not on file  Occupational History  . Not on file  Tobacco Use  . Smoking status: Current Every Day Smoker    Packs/day: 1.00    Years: 40.00    Pack years: 40.00    Types: Cigarettes    Start date: 05/20/1968  . Smokeless tobacco: Never Used  Substance and Sexual Activity  . Alcohol use: Not Currently    Alcohol/week: 0.0 standard drinks    Comment: rare  . Drug use: No  . Sexual activity: Yes  Other Topics Concern  . Not on file  Social History Narrative   Patient continues to smoke.    Social Determinants of Health   Financial  Resource Strain:   . Difficulty of Paying Living Expenses: Not on file  Food Insecurity:   .  Worried About Charity fundraiser in the Last Year: Not on file  . Ran Out of Food in the Last Year: Not on file  Transportation Needs:   . Lack of Transportation (Medical): Not on file  . Lack of Transportation (Non-Medical): Not on file  Physical Activity:   . Days of Exercise per Week: Not on file  . Minutes of Exercise per Session: Not on file  Stress:   . Feeling of Stress : Not on file  Social Connections:   . Frequency of Communication with Friends and Family: Not on file  . Frequency of Social Gatherings with Friends and Family: Not on file  . Attends Religious Services: Not on file  . Active Member of Clubs or Organizations: Not on file  . Attends Archivist Meetings: Not on file  . Marital Status: Not on file  Intimate Partner Violence:   . Fear of Current or Ex-Partner: Not on file  . Emotionally Abused: Not on file  . Physically Abused: Not on file  . Sexually Abused: Not on file     Family History  Problem Relation Age of Onset  . Diabetes Mother   . Alcohol abuse Father     ROS: [x]  Positive   [ ]  Negative   [ ]  All sytems reviewed and are negative  Cardiac: []  chest pain/pressure []  palpitations []  SOB lying flat []  DOE  Vascular: []  pain in legs while walking []  pain in legs at rest []  pain in legs at night []  non-healing ulcers []  hx of DVT []  swelling in legs  Pulmonary: []  productive cough []  asthma/wheezing []  home O2  Neurologic: []  weakness in []  arms []  legs []  numbness in []  arms []  legs []  hx of CVA []  mini stroke [] difficulty speaking or slurred speech []  temporary loss of vision in one eye []  dizziness  Hematologic: []  hx of cancer []  bleeding problems []  problems with blood clotting easily  Endocrine:   []  diabetes []  thyroid disease  GI []  vomiting blood []  blood in stool  GU: [x]  CKD/renal failure [x]  HD--[]   M/W/F or []  T/T/S []  burning with urination []  blood in urine  Psychiatric: []  anxiety []  depression  Musculoskeletal: []  arthritis []  joint pain  Integumentary: []  rashes []  ulcers  Constitutional: []  fever []  chills   Physical Examination There were no vitals filed for this visit. General:  WDWN in NAD Gait: Slow and deliberate HENT: WNL, normocephalic Pulmonary: normal non-labored breathing, without Rales, rhonchi,  wheezing Cardiac: regular, without  Murmurs, rubs or gallops; without carotid bruits Abdomen:  soft, NT/ND, no masses Skin: without rashes Extremities: without ischemic changes, without Gangrene , without cellulitis; without open wounds;  Musculoskeletal: no muscle wasting or atrophy  Neurologic: A&O X 3;  No focal weakness or paresthesias are detected; speech is fluent/normal Psychiatric:  The pt has Normal affect.   CLINICAL DATA:  Abdominal pain x2 weeks. On dialysis. History of abdominal aortic aneurysm.  EXAM: CTA ABDOMEN AND PELVIS WITH CONTRAST  TECHNIQUE: Multidetector CT imaging of the abdomen and pelvis was performed using the standard protocol during bolus administration of intravenous contrast. Multiplanar reconstructed images and MIPs were obtained and reviewed to evaluate the vascular anatomy.  CONTRAST:  182mL OMNIPAQUE IOHEXOL 350 MG/ML SOLN  COMPARISON:  07/08/2016  FINDINGS: VASCULAR  Aorta: Moderate calcified atheromatous plaque throughout. Fusiform infrarenal aneurysm measuring 3.7 x 3.3 cm maximum transverse dimensions (previously 3.4). No leak or stigmata of impending rupture. Small amount of  nonocclusive mural thrombus in the aneurysmal segment. No dissection or stenosis.  Celiac: Scattered calcified atheromatous plaque without stenosis.  SMA: Scattered calcified atheromatous plaque without high-grade stenosis. Classic distal branch anatomy.  Renals: Single left, with diffuse coarse calcified plaque  resulting in at least mild stenosis.  Single right, with heavily calcified plaque resulting in short segment stenosis of probable hemodynamic significance shortly beyond the origin, patent distally.  IMA: Patent, arising from aneurysmal segment of the aorta.  Inflow: On the right, contiguous fusiform dilatation of the proximal common iliac up to 2.1 cm diameter. Diffuse scattered calcified plaque. Mild stenosis in the mid external iliac.  On the left, extensive calcified plaque without high-grade stenosis. Fusiform dilatation of the distal common iliac up to 1.6 cm diameter.  Proximal Outflow: Atheromatous  but patent  Veins: Patent renal veins, portal vein, hepatic veins. IVC unremarkable. No venous pathology evident.  Review of the MIP images confirms the above findings.  NON-VASCULAR  Lower chest: Linear scarring or atelectasis laterally at the right lung base. No pleural or pericardial effusion. Transvenous pacing leads partially seen.  Hepatobiliary: No focal liver abnormality is seen. Status post cholecystectomy. No biliary dilatation.  Pancreas: Unremarkable. No pancreatic ductal dilatation or surrounding inflammatory changes.  Spleen: Normal in size without focal abnormality.  Adrenals/Urinary Tract: Adrenals unremarkable. 1.6 cm exophytic lesion from the upper pole right kidney, increased since prior study. No hydronephrosis. Urinary bladder physiologically distended.  Stomach/Bowel: Stomach decompressed. Small bowel nondilated, with anastomotic staple line in the right upper abdomen. Colon is nondistended, unremarkable.  Lymphatic: No abdominal or pelvic adenopathy.  Reproductive: Mild prostate enlargement.  Other: No ascites.  Bilateral pelvic phleboliths.  No free air.  Musculoskeletal: Interbody fusion hardware L4-5, L5-S1. No fracture or worrisome bone lesion.  IMPRESSION: 1. No acute findings. 2. 3.7 cm infrarenal abdominal  aortic aneurysm (previously 3.4) without complicating features. 3. 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 4. 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 5. Enlarging 1.6 cm exophytic lesion from the upper pole right kidney. Consider elective outpatient renal MR or CT without/with contrast for further characterization to exclude neoplasm.   Electronically Signed   By: Lucrezia Europe M.D.   On: 05/12/2019 16:03    CBC    Component Value Date/Time   WBC 6.5 04/11/2019 2028   RBC 2.91 (L) 04/11/2019 2028   HGB 13.9 05/06/2019 0809   HCT 41.0 05/06/2019 0809   PLT 148 (L) 04/11/2019 2028   MCV 101.4 (H) 04/11/2019 2028   MCH 33.0 04/11/2019 2028   MCHC 32.5 04/11/2019 2028   RDW 15.5 04/11/2019 2028   LYMPHSABS 1.0 04/10/2019 1244   MONOABS 0.7 04/10/2019 1244   EOSABS 0.1 04/10/2019 1244   BASOSABS 0.1 04/10/2019 1244    BMET    Component Value Date/Time   NA 136 05/06/2019 0809   NA 142 08/06/2017 1413   K 4.1 05/06/2019 0809   CL 98 05/06/2019 0809   CO2 24 04/12/2019 0614   GLUCOSE 103 (H) 05/06/2019 0809   BUN 29 (H) 05/06/2019 0809   BUN 55 (H) 08/06/2017 1413   CREATININE 4.50 (H) 05/06/2019 0809   CREATININE 2.01 (H) 07/28/2015 1606   CALCIUM 8.6 (L) 04/12/2019 0614   CALCIUM 8.3 (L) 04/09/2019 0951   GFRNONAA 11 (L) 04/12/2019 0614   GFRAA 13 (L) 04/12/2019 0614    COAGS: Lab Results  Component Value Date   INR 1.2 04/11/2019   INR 1.11 03/19/2018  INR 1.12 03/18/2018    ASSESSMENT/PLAN: This is a 69 y.o. male with approximately 6-8 week history of persistant abdominal associated with hemodialysis treatment.  I reviewed history, physical exam findings and CTA results with Dr. Scot Dock. No significant stenosis of celiac artery, SMA or IMA and symptomology not classic for mesenteric ischemia.   -Recommend ultrasound of aorta in 2 years.   Risa Grill, PA-C [facility] [phone]

## 2019-06-16 DIAGNOSIS — Z992 Dependence on renal dialysis: Secondary | ICD-10-CM | POA: Diagnosis not present

## 2019-06-16 DIAGNOSIS — N186 End stage renal disease: Secondary | ICD-10-CM | POA: Diagnosis not present

## 2019-06-16 DIAGNOSIS — E559 Vitamin D deficiency, unspecified: Secondary | ICD-10-CM | POA: Diagnosis not present

## 2019-06-16 DIAGNOSIS — D509 Iron deficiency anemia, unspecified: Secondary | ICD-10-CM | POA: Diagnosis not present

## 2019-06-16 DIAGNOSIS — Z23 Encounter for immunization: Secondary | ICD-10-CM | POA: Diagnosis not present

## 2019-06-18 DIAGNOSIS — E7849 Other hyperlipidemia: Secondary | ICD-10-CM | POA: Diagnosis not present

## 2019-06-18 DIAGNOSIS — I10 Essential (primary) hypertension: Secondary | ICD-10-CM | POA: Diagnosis not present

## 2019-06-19 DIAGNOSIS — N186 End stage renal disease: Secondary | ICD-10-CM | POA: Diagnosis not present

## 2019-06-19 DIAGNOSIS — Z992 Dependence on renal dialysis: Secondary | ICD-10-CM | POA: Diagnosis not present

## 2019-06-19 DIAGNOSIS — E559 Vitamin D deficiency, unspecified: Secondary | ICD-10-CM | POA: Diagnosis not present

## 2019-06-19 DIAGNOSIS — R110 Nausea: Secondary | ICD-10-CM | POA: Diagnosis not present

## 2019-06-19 DIAGNOSIS — N19 Unspecified kidney failure: Secondary | ICD-10-CM | POA: Diagnosis not present

## 2019-06-19 DIAGNOSIS — R519 Headache, unspecified: Secondary | ICD-10-CM | POA: Diagnosis not present

## 2019-06-19 DIAGNOSIS — D509 Iron deficiency anemia, unspecified: Secondary | ICD-10-CM | POA: Diagnosis not present

## 2019-06-19 DIAGNOSIS — Z23 Encounter for immunization: Secondary | ICD-10-CM | POA: Diagnosis not present

## 2019-06-20 DIAGNOSIS — N186 End stage renal disease: Secondary | ICD-10-CM | POA: Diagnosis not present

## 2019-06-20 DIAGNOSIS — Z992 Dependence on renal dialysis: Secondary | ICD-10-CM | POA: Diagnosis not present

## 2019-06-21 ENCOUNTER — Ambulatory Visit (INDEPENDENT_AMBULATORY_CARE_PROVIDER_SITE_OTHER): Payer: Medicare Other | Admitting: Physician Assistant

## 2019-06-21 ENCOUNTER — Encounter: Payer: Self-pay | Admitting: Physician Assistant

## 2019-06-21 VITALS — BP 175/71 | HR 66 | Temp 96.6°F | Ht 76.0 in | Wt 237.0 lb

## 2019-06-21 DIAGNOSIS — I480 Paroxysmal atrial fibrillation: Secondary | ICD-10-CM | POA: Diagnosis not present

## 2019-06-21 DIAGNOSIS — R001 Bradycardia, unspecified: Secondary | ICD-10-CM

## 2019-06-21 DIAGNOSIS — I10 Essential (primary) hypertension: Secondary | ICD-10-CM

## 2019-06-21 DIAGNOSIS — I2510 Atherosclerotic heart disease of native coronary artery without angina pectoris: Secondary | ICD-10-CM | POA: Diagnosis not present

## 2019-06-21 DIAGNOSIS — I5032 Chronic diastolic (congestive) heart failure: Secondary | ICD-10-CM

## 2019-06-21 DIAGNOSIS — M542 Cervicalgia: Secondary | ICD-10-CM

## 2019-06-21 DIAGNOSIS — R0989 Other specified symptoms and signs involving the circulatory and respiratory systems: Secondary | ICD-10-CM

## 2019-06-21 DIAGNOSIS — Z720 Tobacco use: Secondary | ICD-10-CM | POA: Diagnosis not present

## 2019-06-21 NOTE — Progress Notes (Signed)
Cardiology Office Note    Date:  06/21/2019   ID:  [name], [name] 05/08/1951, MRN [id number]  PCP:  Manon Hilding, MD  Cardiologist: Carlyle Dolly, MD EPS: None  Chief Complaint  Patient presents with  . Neck Pain    History of Present Illness:  [name] is a 69 y.o. male with history of CAD s/p DES RCA 1999, cath 2003 mod CAD, NST 2016 small area ischemia, low risk, NST 11/2018 no ischemia, atrial flutter CHADSVASC=3 but eliquis stopped because of anemia and CKD 10/2018, pacemaker for bradycardia and post termination pauses followed by Dr. Rayann Heman, HTN, COPD, DM, chronic diastolic CHF, AAA 3.7 x 3.3 cm 04/2019. Echo 11/2018 normal LVEF 60-65% with impaired relaxation.  Stress test and echo done 11/2018 for possible kidney transplant.  Pacer check 04/2019 19 hrs Afib. Last saw Dr. [name] 04/24/18 and stable. Dr. Rayann Heman 10/2018.  Patient comes in with his wife. Having dialysis now with a temporary port. Having stomach pains, vomiting and headaches since on dialysis.Pain starts in back of left neck and goes up behind his head and then headache all over. Was evaluated for mesenteric ischemia but that was ruled out.BP up today but hasn't taken his meds. BP running low on dialysis days but around 140/70. Was trying to get a kidney transplant but he started smoking again. Feels drained and not active anymore. Denies chest pain or shortness of breath. Stomach hurts constantly  Past Medical History:  Diagnosis Date  . AAA (abdominal aortic aneurysm) ([place]) 06/2016   Mild increase in size of 3.4 cm infrarenal abdominal aortic aneurysm  . Anemia   . Aortic atherosclerosis ([place])   . Asthma   . Atrial flutter ([place])   . AVF (arteriovenous fistula) (H[facility])    Left forearm  . CAD (coronary artery disease)    a. s/p prior stenting of RCA in 1999 b. cath in 2003 showing moderate CAD c. NST in 2016 showing small area of ischemic and low-risk  . CHF (congestive heart failure) ([place])   . CKD  (chronic kidney disease), stage V ([place])    kidney transplant evaluation  . COPD (chronic obstructive pulmonary disease) ([place])    with ongoing tobacco use and patient failed sham takes  . Diverticulosis 2018   Mild sigmoid colon diverticulosis.  Marland Kitchen DM2 (diabetes mellitus, type 2) ([place])   . Dyslipidemia   . Dysrhythmia 2018   atrial fibrillation  . Edema    chronic lower extremity secondary to right heart failure and chronic venous insufficiency  . Fatigue   . Fatty liver 2010   Mild  . Headache   . HTN (hypertension)   . Morbid obesity ([place])   . Multiple pulmonary nodules 05/2018   Bilateral  . Pneumonia   . Presence of permanent cardiac pacemaker   . 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   Marland Kitchen Reflux esophagitis    hx  . Sleep apnea    PT STATES ONE TEST WAS POSITIVE AND ANOTHER TEST WAS NEGATIVE  . Two-vessel coronary artery disease    moderate. by cath in 2003. Status post stenting of the mdi RCA November 1999 normal left ventricular ejection fraction  . Wears glasses   . Wears hearing aid    B/L    Past Surgical History:  Procedure Laterality Date  . A/V FISTULAGRAM Left 04/30/2019   Procedure: A/V FISTULAGRAM;  Surgeon: Elam Dutch, MD;  Location:  [facility];  Service: Cardiovascular;  Laterality: Left;  . ABDOMINAL SURGERY    . APPENDECTOMY    . AV FISTULA PLACEMENT Right 03/11/2018   Procedure: CREATION Brachiocephalic Fistula RIGHT ARM;  Surgeon: Rosetta Posner, MD;  Location: [facility];  Service: Vascular;  Laterality: Right;  . AV FISTULA PLACEMENT Left 05/06/2019   Procedure: LEFT BRACHIOCEPHALIC ARTERIOVENOUS (AV) FISTULA CREATION;  Surgeon: Marty Heck, MD;  Location: [facility];  Service: Vascular;  Laterality: Left;  . BACK SURGERY     x3; cages in patient's back since 2000  . BIOPSY  11/12/2018   Procedure: BIOPSY;  Surgeon: Laurence Spates, MD;  Location: [facility];  Service:  Endoscopy;;  . CARDIAC CATHETERIZATION     stent  . CHOLECYSTECTOMY    . CLOSED REDUCTION SHOULDER DISLOCATION    . COLONOSCOPY W/ BIOPSIES AND POLYPECTOMY    . COLONOSCOPY WITH PROPOFOL N/A 11/12/2018   Procedure: COLONOSCOPY WITH PROPOFOL;  Surgeon: Laurence Spates, MD;  Location: [facility];  Service: Endoscopy;  Laterality: N/A;  . CORONARY ANGIOPLASTY    . diabetic ulcers    . DIAGNOSTIC LAPAROSCOPY    . ESOPHAGOGASTRODUODENOSCOPY (EGD) WITH PROPOFOL N/A 11/12/2018   Procedure: ESOPHAGOGASTRODUODENOSCOPY (EGD) WITH PROPOFOL;  Surgeon: Laurence Spates, MD;  Location: [facility];  Service: Endoscopy;  Laterality: N/A;  . GROIN EXPLORATION    . HEMOSTASIS CLIP PLACEMENT  11/12/2018   Procedure: HEMOSTASIS CLIP PLACEMENT;  Surgeon: Laurence Spates, MD;  Location: [facility];  Service: Endoscopy;;  . INSERT / REPLACE / REMOVE PACEMAKER    . INSERTION OF ARTERIOVENOUS (AV) ARTEGRAFT ARM Left 03/18/2018   Procedure: INSERTION OF ARTERIOVENOUS (AV) FISTULA;  Surgeon: Rosetta Posner, MD;  Location: [facility];  Service: Vascular;  Laterality: Left;  . IR FLUORO GUIDE CV LINE RIGHT  04/11/2019  . IR US GUIDE VASC ACCESS RIGHT  04/11/2019  . LIGATION ARTERIOVENOUS GORTEX GRAFT Right 03/18/2018   Procedure: LIGATION ARTERIOVENOUS FISTULA;  Surgeon: Rosetta Posner, MD;  Location: [facility];  Service: Vascular;  Laterality: Right;  . OSTECTOMY Left 04/23/2017   Procedure: OSTECTOMY LEFT GREAT TOE;  Surgeon: [name], DPM;  Location: [facility];  Service: Podiatry;  Laterality: Left;  left great toe  . POLYPECTOMY  11/12/2018   Procedure: POLYPECTOMY;  Surgeon: Laurence Spates, MD;  Location: [facility];  Service: Endoscopy;;  . TUMOR REMOVAL     small intestine x3    Current Medications: Current Meds  Medication Sig  . acetaminophen (TYLENOL) 325 MG tablet Take 975 mg by mouth every morning.   Marland Kitchen albuterol (VENTOLIN HFA) 108 (90 BASE) MCG/ACT inhaler Inhale 2 puffs into the lungs every 6 (six)  hours as needed for wheezing or shortness of breath.   Marland Kitchen atorvastatin (LIPITOR) 40 MG tablet Take 1 tablet (40 mg total) by mouth every evening. (Patient taking differently: Take 40 mg by mouth every morning. )  . beclomethasone (QVAR) 80 MCG/ACT inhaler Inhale 2 puffs into the lungs 2 (two) times daily as needed (shortness of breath).  Marland Kitchen buPROPion (WELLBUTRIN SR) 150 MG 12 hr tablet Take 150 mg by mouth 2 (two) times daily.  . carvedilol (COREG) 25 MG tablet TAKE ONE TABLET BY MOUTH TWICE DAILY. (Patient taking differently: Take 25 mg by mouth 2 (two) times daily with a meal. )  . Cholecalciferol (VITAMIN D3) 2000 units TABS Take 2,000 Units by mouth every morning.   . diltiazem (CARDIZEM) 30 MG tablet TAKE ONE TABLET BY MOUTH TWICE DAILY. (  Patient taking differently: Take 30 mg by mouth 2 (two) times daily. )  . epoetin alfa (EPOGEN) 4000 UNIT/ML injection Inject 4,000 Units into the vein See admin instructions. Infusion done as needed for hematocrit <10  . hydrALAZINE (APRESOLINE) 100 MG tablet TAKE ONE TABLET BY MOUTH THREE TIMES DAILY (Patient taking differently: Take 100-200 mg by mouth See admin instructions. Take two tablets (200 mg) by mouth every morning and one tablet (100 mg) at night)  . iron polysaccharides (NIFEREX) 150 MG capsule Take 150 mg by mouth every morning.  . linaclotide (LINZESS) 290 MCG CAPS capsule Take 290 mcg by mouth daily as needed (constipation).  . Melatonin 10 MG TABS Take 10 mg by mouth at bedtime as needed (sleep).   . Multiple Vitamin (MULTIVITAMIN WITH MINERALS) TABS tablet Take 1 tablet by mouth every morning.   Marland Kitchen omeprazole (PRILOSEC) 20 MG capsule Take 20 mg by mouth every morning.   Marland Kitchen oxyCODONE-acetaminophen (PERCOCET) 5-325 MG tablet Take 1 tablet by mouth every 6 (six) hours as needed for severe pain.  . polyethylene glycol (MIRALAX / GLYCOLAX) packet Take 17 g by mouth every morning. Mix in morning coffee  . potassium chloride SA (K-DUR,KLOR-CON) 20 MEQ  tablet Take 1 tablet (20 mEq total) by mouth daily. (Patient taking differently: Take 20 mEq by mouth every morning. )  . Sennosides 25 MG TABS Take 50 mg by mouth at bedtime.  . sildenafil (VIAGRA) 100 MG tablet Take 1 tablet (100 mg total) by mouth as needed for erectile dysfunction. (Patient taking differently: Take 100 mg by mouth daily as needed for erectile dysfunction. )  . silver sulfADIAZINE (SILVADENE) 1 % cream Apply 1 application topically daily as needed (For wound care with dressing).      Allergies:   Patient has no known allergies.   Social History   Socioeconomic History  . Marital status: Married    Spouse name: Not on file  . Number of children: Not on file  . Years of education: Not on file  . Highest education level: Not on file  Occupational History  . Not on file  Tobacco Use  . Smoking status: Current Every Day Smoker    Packs/day: 1.00    Years: 40.00    Pack years: 40.00    Types: Cigarettes    Start date: 05/20/1968  . Smokeless tobacco: Never Used  Substance and Sexual Activity  . Alcohol use: Not Currently    Alcohol/week: 0.0 standard drinks    Comment: rare  . Drug use: No  . Sexual activity: Yes  Other Topics Concern  . Not on file  Social History Narrative   Patient continues to smoke.    Social Determinants of Health   Financial Resource Strain:   . Difficulty of Paying Living Expenses: Not on file  Food Insecurity:   . Worried About Charity fundraiser in the Last Year: Not on file  . Ran Out of Food in the Last Year: Not on file  Transportation Needs:   . Lack of Transportation (Medical): Not on file  . Lack of Transportation (Non-Medical): Not on file  Physical Activity:   . Days of Exercise per Week: Not on file  . Minutes of Exercise per Session: Not on file  Stress:   . Feeling of Stress : Not on file  Social Connections:   . Frequency of Communication with Friends and Family: Not on file  . Frequency of Social Gatherings  with Friends and Family:  Not on file  . Attends Religious Services: Not on file  . Active Member of Clubs or Organizations: Not on file  . Attends Archivist Meetings: Not on file  . Marital Status: Not on file     Family History:  The patient's   family history includes Alcohol abuse in his father; Diabetes in his mother.   ROS:   Please see the history of present illness.    ROS All other systems reviewed and are negative.   PHYSICAL EXAM:   VS:  BP (!) 175/71   Pulse 66   Temp (!) 96.6 F (35.9 C)   Ht 6\' 4"  (1.93 m)   Wt 237 lb (107.5 kg)   BMI 28.85 kg/m   Physical Exam  GEN: Well nourished, well developed, in no acute distress  Neck: Bilateral carotid bruits, left subclavian bruit no JVD,  or masses Cardiac:RRR; no murmurs, rubs, or gallops  Respiratory: Decreased breath sounds with diffuse inspiratory wheezing GI: soft, nontender, nondistended, + BS Ext: Trace of left ankle edema brawny changes bilaterally decreased distal pulses bilaterally Neuro:  Alert and Oriented x 3 Psych: euthymic mood, full affect  Wt Readings from Last 3 Encounters:  06/21/19 237 lb (107.5 kg)  06/09/19 229 lb (103.9 kg)  05/06/19 227 lb (103 kg)      Studies/Labs Reviewed:   EKG:  EKG is not ordered today.   Recent Labs: 04/10/2019: ALT 18; B Natriuretic Peptide 1,349.3 04/11/2019: Magnesium 2.0; Platelets 148 05/06/2019: BUN 29; Creatinine, Ser 4.50; Hemoglobin 13.9; Potassium 4.1; Sodium 136   Lipid Panel No results found for: CHOL, TRIG, HDL, CHOLHDL, VLDL, LDLCALC, LDLDIRECT  Additional studies/ records that were reviewed today include:   Echo 12/15/18 IMPRESSIONS     1. The left ventricle has normal systolic function with an ejection  fraction of 60-65%. The cavity size was normal. There is severely  increased left ventricular wall thickness. Left ventricular diastolic  Doppler parameters are consistent with impaired  relaxation.   2. The right ventricle has  normal systolic function. The cavity was  normal. There is mildly increased right ventricular wall thickness.   3. Left atrial size was moderately dilated.   4. The aortic valve is tricuspid. Mild thickening of the aortic valve.  Mild calcification of the aortic valve. No stenosis of the aortic valve.  Mild aortic annular calcification noted.   5. The mitral valve is abnormal. Mild thickening of the mitral valve  leaflet. Mild calcification of the mitral valve leaflet. There is mild  mitral annular calcification present. No evidence of mitral valve  stenosis.   6. The aorta is normal in size and structure.   7. The aortic root is normal in size and structure.   8. Pulmonary hypertension is indeterminate, inadequate TR jet.   9. The inferior vena cava was dilated in size with >50% respiratory  variability.   NST 12/15/18 There was no ST segment deviation noted during stress. 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 This is a low risk study. The left ventricular ejection fraction is mildly decreased by nuclear imaging (46%). By echo done same day LVEF is 60-65%. Likely processing error by nuclear study, 60-65% would be considered the true LVEF.  CT abdomen 12/2020IMPRESSION: 1. No acute findings. 2. 3.7 cm infrarenal abdominal aortic aneurysm (previously 3.4) without complicating features. 3. 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 4. 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  5. Enlarging 1.6 cm exophytic lesion from the upper pole right kidney. Consider elective outpatient renal MR or CT without/with contrast for further characterization to exclude neoplasm.     Electronically Signed   By: Lucrezia Europe M.D.   On: 05/12/2019 16:03     ASSESSMENT:    1. Neck pain   2. Coronary artery disease involving native coronary artery of native heart without angina pectoris   3. Essential hypertension    4. Paroxysmal atrial fibrillation (HCC)   5. Bradycardia   6. Chronic diastolic CHF (congestive heart failure) ([place])   7. Tobacco abuse   8. Bilateral carotid bruits      PLAN:  In order of problems listed above:  Neck pain with headaches and abdominal pain since starting dialysis.  Mesenteric ischemia ruled out.  I I suspect these symptoms are related to his dialysis.  I do not feel they are cardiac related.  He does have bilateral carotid bruits and I will order Dopplers but do not feel like this is causing his pain.  CAD status post DES to the RCA in 1999, cath in 2003 with moderate CAD medical treatment recommended, NST 11/2018 no ischemia, echo 11/2018 normal LV function with impaired relaxation  Essential hypertension blood pressure up today but he has not taken his medications.  It drops on dialysis days and then hold his medications.  His wife will continue to monitor this.  Paroxysmal atrial fibrillation Eliquis stopped 10/2018 because of anemia and end-stage renal disease  Symptomatic bradycardia with posttermination pauses status post pacemaker followed by Dr. Rayann Heman  Chronic diastolic CHF compensated  Abdominal aortic aneurysm 3.7 x 3.3 on CT 04/2019  Tobacco abuse unfortunately he restarted smoking again.  Smoking cessation advised.  End-stage renal disease on hemodialysis.  Was evaluated for possible kidney transplant but cannot be put on a list unless he quit smoking.  Bilateral carotid bruits check carotid Dopplers  Medication Adjustments/Labs and Tests Ordered: Current medicines are reviewed at length with the patient today.  Concerns regarding medicines are outlined above.  Medication changes, Labs and Tests ordered today are listed in the Patient Instructions below. Patient Instructions  Medication Instructions:  Your physician recommends that you continue on your current medications as directed. Please refer to the Current Medication list given to you  today.  *If you need a refill on your cardiac medications before your next appointment, please call your pharmacy*  Lab Work: None If you have labs (blood work) drawn today and your tests are completely normal, you will receive your results only by: Marland Kitchen MyChart Message (if you have MyChart) OR . A paper copy in the mail If you have any lab test that is abnormal or we need to change your treatment, we will call you to review the results.  Testing/Procedures: Your physician has requested that you have a carotid duplex. This test is an ultrasound of the carotid arteries in your neck. It looks at blood flow through these arteries that supply the brain with blood. Allow one hour for this exam. There are no restrictions or special instructions.    Follow-Up: At [facility], you and your health needs are our priority.  As part of our continuing mission to provide you with exceptional heart care, we have created designated Provider Care Teams.  These Care Teams include your primary Cardiologist (physician) and Advanced Practice Providers (APPs -  Physician Assistants and Nurse Practitioners) who all work together to provide you with the care you need,  when you need it.  Your next appointment:   2 month(s)  The format for your next appointment:   In Person  Provider:   Carlyle Dolly, MD  Other Instructions STOP SMOKING      Thank you for choosing [facility] !            Sumner Boast, PA-C  06/21/2019 12:39 PM    [facility] [place], [place], [place]  [address] Phone: [phone]; Fax: [phone]

## 2019-06-21 NOTE — Patient Instructions (Signed)
Medication Instructions:  Your physician recommends that you continue on your current medications as directed. Please refer to the Current Medication list given to you today.  *If you need a refill on your cardiac medications before your next appointment, please call your pharmacy*  Lab Work: None If you have labs (blood work) drawn today and your tests are completely normal, you will receive your results only by: Marland Kitchen MyChart Message (if you have MyChart) OR . A paper copy in the mail If you have any lab test that is abnormal or we need to change your treatment, we will call you to review the results.  Testing/Procedures: Your physician has requested that you have a carotid duplex. This test is an ultrasound of the carotid arteries in your neck. It looks at blood flow through these arteries that supply the brain with blood. Allow one hour for this exam. There are no restrictions or special instructions.    Follow-Up: At [facility], you and your health needs are our priority.  As part of our continuing mission to provide you with exceptional heart care, we have created designated Provider Care Teams.  These Care Teams include your primary Cardiologist (physician) and Advanced Practice Providers (APPs -  Physician Assistants and Nurse Practitioners) who all work together to provide you with the care you need, when you need it.  Your next appointment:   2 month(s)  The format for your next appointment:   In Person  Provider:   Carlyle Dolly, MD  Other Instructions STOP SMOKING      Thank you for choosing [facility] !

## 2019-06-22 DIAGNOSIS — Z23 Encounter for immunization: Secondary | ICD-10-CM | POA: Diagnosis not present

## 2019-06-22 DIAGNOSIS — D509 Iron deficiency anemia, unspecified: Secondary | ICD-10-CM | POA: Diagnosis not present

## 2019-06-22 DIAGNOSIS — N186 End stage renal disease: Secondary | ICD-10-CM | POA: Diagnosis not present

## 2019-06-22 DIAGNOSIS — R110 Nausea: Secondary | ICD-10-CM | POA: Diagnosis not present

## 2019-06-22 DIAGNOSIS — Z992 Dependence on renal dialysis: Secondary | ICD-10-CM | POA: Diagnosis not present

## 2019-06-22 DIAGNOSIS — R1110 Vomiting, unspecified: Secondary | ICD-10-CM | POA: Diagnosis not present

## 2019-06-22 DIAGNOSIS — N2581 Secondary hyperparathyroidism of renal origin: Secondary | ICD-10-CM | POA: Diagnosis not present

## 2019-06-22 DIAGNOSIS — E119 Type 2 diabetes mellitus without complications: Secondary | ICD-10-CM | POA: Diagnosis not present

## 2019-06-22 DIAGNOSIS — R112 Nausea with vomiting, unspecified: Secondary | ICD-10-CM | POA: Diagnosis not present

## 2019-06-22 DIAGNOSIS — D631 Anemia in chronic kidney disease: Secondary | ICD-10-CM | POA: Diagnosis not present

## 2019-06-24 DIAGNOSIS — N186 End stage renal disease: Secondary | ICD-10-CM | POA: Diagnosis not present

## 2019-06-24 DIAGNOSIS — Z23 Encounter for immunization: Secondary | ICD-10-CM | POA: Diagnosis not present

## 2019-06-24 DIAGNOSIS — R110 Nausea: Secondary | ICD-10-CM | POA: Diagnosis not present

## 2019-06-24 DIAGNOSIS — R112 Nausea with vomiting, unspecified: Secondary | ICD-10-CM | POA: Diagnosis not present

## 2019-06-24 DIAGNOSIS — D509 Iron deficiency anemia, unspecified: Secondary | ICD-10-CM | POA: Diagnosis not present

## 2019-06-24 DIAGNOSIS — Z992 Dependence on renal dialysis: Secondary | ICD-10-CM | POA: Diagnosis not present

## 2019-06-26 DIAGNOSIS — Z992 Dependence on renal dialysis: Secondary | ICD-10-CM | POA: Diagnosis not present

## 2019-06-26 DIAGNOSIS — N186 End stage renal disease: Secondary | ICD-10-CM | POA: Diagnosis not present

## 2019-06-26 DIAGNOSIS — R112 Nausea with vomiting, unspecified: Secondary | ICD-10-CM | POA: Diagnosis not present

## 2019-06-26 DIAGNOSIS — Z23 Encounter for immunization: Secondary | ICD-10-CM | POA: Diagnosis not present

## 2019-06-26 DIAGNOSIS — R110 Nausea: Secondary | ICD-10-CM | POA: Diagnosis not present

## 2019-06-26 DIAGNOSIS — D509 Iron deficiency anemia, unspecified: Secondary | ICD-10-CM | POA: Diagnosis not present

## 2019-06-28 ENCOUNTER — Other Ambulatory Visit: Payer: Self-pay

## 2019-06-28 ENCOUNTER — Ambulatory Visit (HOSPITAL_COMMUNITY)
Admission: RE | Admit: 2019-06-28 | Discharge: 2019-06-28 | Disposition: A | Discharge status: Home / Self Care | Payer: Medicare Other | Source: Ambulatory Visit | Attending: Physician Assistant | Admitting: Physician Assistant

## 2019-06-28 DIAGNOSIS — R0989 Other specified symptoms and signs involving the circulatory and respiratory systems: Secondary | ICD-10-CM | POA: Diagnosis not present

## 2019-06-28 DIAGNOSIS — I6523 Occlusion and stenosis of bilateral carotid arteries: Secondary | ICD-10-CM | POA: Diagnosis not present

## 2019-06-28 DIAGNOSIS — E1142 Type 2 diabetes mellitus with diabetic polyneuropathy: Secondary | ICD-10-CM | POA: Diagnosis not present

## 2019-06-28 DIAGNOSIS — L97512 Non-pressure chronic ulcer of other part of right foot with fat layer exposed: Secondary | ICD-10-CM | POA: Diagnosis not present

## 2019-06-29 ENCOUNTER — Ambulatory Visit: Payer: Medicare Other | Admitting: Urology

## 2019-06-29 DIAGNOSIS — N186 End stage renal disease: Secondary | ICD-10-CM | POA: Diagnosis not present

## 2019-06-29 DIAGNOSIS — R112 Nausea with vomiting, unspecified: Secondary | ICD-10-CM | POA: Diagnosis not present

## 2019-06-29 DIAGNOSIS — Z992 Dependence on renal dialysis: Secondary | ICD-10-CM | POA: Diagnosis not present

## 2019-06-29 DIAGNOSIS — R110 Nausea: Secondary | ICD-10-CM | POA: Diagnosis not present

## 2019-06-29 DIAGNOSIS — Z23 Encounter for immunization: Secondary | ICD-10-CM | POA: Diagnosis not present

## 2019-06-29 DIAGNOSIS — D509 Iron deficiency anemia, unspecified: Secondary | ICD-10-CM | POA: Diagnosis not present

## 2019-06-30 ENCOUNTER — Telehealth: Payer: Self-pay | Admitting: *Deleted

## 2019-06-30 DIAGNOSIS — R0989 Other specified symptoms and signs involving the circulatory and respiratory systems: Secondary | ICD-10-CM

## 2019-06-30 NOTE — Telephone Encounter (Signed)
Pt.notified

## 2019-06-30 NOTE — Telephone Encounter (Signed)
-----   Message from Bernita Raisin, RN sent at 06/29/2019  8:15 AM EST -----  ----- Message ----- From: Murrell Converse Sent: 06/29/2019   7:46 AM EST To: Bernita Raisin, RN  50-69% Left ICA. Refer to [facility] to be followed. thanks

## 2019-07-01 DIAGNOSIS — R112 Nausea with vomiting, unspecified: Secondary | ICD-10-CM | POA: Diagnosis not present

## 2019-07-01 DIAGNOSIS — N186 End stage renal disease: Secondary | ICD-10-CM | POA: Diagnosis not present

## 2019-07-01 DIAGNOSIS — Z992 Dependence on renal dialysis: Secondary | ICD-10-CM | POA: Diagnosis not present

## 2019-07-01 DIAGNOSIS — D509 Iron deficiency anemia, unspecified: Secondary | ICD-10-CM | POA: Diagnosis not present

## 2019-07-01 DIAGNOSIS — R110 Nausea: Secondary | ICD-10-CM | POA: Diagnosis not present

## 2019-07-01 DIAGNOSIS — Z23 Encounter for immunization: Secondary | ICD-10-CM | POA: Diagnosis not present

## 2019-07-03 DIAGNOSIS — N186 End stage renal disease: Secondary | ICD-10-CM | POA: Diagnosis not present

## 2019-07-03 DIAGNOSIS — Z992 Dependence on renal dialysis: Secondary | ICD-10-CM | POA: Diagnosis not present

## 2019-07-03 DIAGNOSIS — D509 Iron deficiency anemia, unspecified: Secondary | ICD-10-CM | POA: Diagnosis not present

## 2019-07-03 DIAGNOSIS — R112 Nausea with vomiting, unspecified: Secondary | ICD-10-CM | POA: Diagnosis not present

## 2019-07-03 DIAGNOSIS — R110 Nausea: Secondary | ICD-10-CM | POA: Diagnosis not present

## 2019-07-03 DIAGNOSIS — Z23 Encounter for immunization: Secondary | ICD-10-CM | POA: Diagnosis not present

## 2019-07-06 DIAGNOSIS — D509 Iron deficiency anemia, unspecified: Secondary | ICD-10-CM | POA: Diagnosis not present

## 2019-07-06 DIAGNOSIS — Z23 Encounter for immunization: Secondary | ICD-10-CM | POA: Diagnosis not present

## 2019-07-06 DIAGNOSIS — R110 Nausea: Secondary | ICD-10-CM | POA: Diagnosis not present

## 2019-07-06 DIAGNOSIS — N186 End stage renal disease: Secondary | ICD-10-CM | POA: Diagnosis not present

## 2019-07-06 DIAGNOSIS — Z992 Dependence on renal dialysis: Secondary | ICD-10-CM | POA: Diagnosis not present

## 2019-07-06 DIAGNOSIS — R112 Nausea with vomiting, unspecified: Secondary | ICD-10-CM | POA: Diagnosis not present

## 2019-07-07 DIAGNOSIS — Z992 Dependence on renal dialysis: Secondary | ICD-10-CM | POA: Diagnosis not present

## 2019-07-07 DIAGNOSIS — Z23 Encounter for immunization: Secondary | ICD-10-CM | POA: Diagnosis not present

## 2019-07-07 DIAGNOSIS — D509 Iron deficiency anemia, unspecified: Secondary | ICD-10-CM | POA: Diagnosis not present

## 2019-07-07 DIAGNOSIS — R110 Nausea: Secondary | ICD-10-CM | POA: Diagnosis not present

## 2019-07-07 DIAGNOSIS — R112 Nausea with vomiting, unspecified: Secondary | ICD-10-CM | POA: Diagnosis not present

## 2019-07-07 DIAGNOSIS — N186 End stage renal disease: Secondary | ICD-10-CM | POA: Diagnosis not present

## 2019-07-10 ENCOUNTER — Ambulatory Visit: Payer: Medicare Other | Attending: Internal Medicine

## 2019-07-10 DIAGNOSIS — D509 Iron deficiency anemia, unspecified: Secondary | ICD-10-CM | POA: Diagnosis not present

## 2019-07-10 DIAGNOSIS — R112 Nausea with vomiting, unspecified: Secondary | ICD-10-CM | POA: Diagnosis not present

## 2019-07-10 DIAGNOSIS — Z23 Encounter for immunization: Secondary | ICD-10-CM | POA: Diagnosis not present

## 2019-07-10 DIAGNOSIS — Z992 Dependence on renal dialysis: Secondary | ICD-10-CM | POA: Diagnosis not present

## 2019-07-10 DIAGNOSIS — N186 End stage renal disease: Secondary | ICD-10-CM | POA: Diagnosis not present

## 2019-07-10 DIAGNOSIS — R110 Nausea: Secondary | ICD-10-CM | POA: Diagnosis not present

## 2019-07-10 NOTE — Progress Notes (Signed)
   Covid-19 Vaccination Clinic  Name:  PAOLO OKANE    MRN: [id number] DOB: 1951/03/15  07/10/2019  Mr. Haugan was observed post Covid-19 immunization for 15 minutes without incidence. He was provided with Vaccine Information Sheet and instruction to access the V-Safe system.   Mr. Echavarria was instructed to call 911 with any severe reactions post vaccine: Marland Kitchen Difficulty breathing  . Swelling of your face and throat  . A fast heartbeat  . A bad rash all over your body  . Dizziness and weakness    Immunizations Administered    Name Date Dose VIS Date Route   Pfizer COVID-19 Vaccine 07/10/2019  3:25 PM 0.3 mL 04/30/2019 Intramuscular   Manufacturer: [place]   Lot: [id number]   [place]: [id number]

## 2019-07-12 DIAGNOSIS — R1084 Generalized abdominal pain: Secondary | ICD-10-CM | POA: Diagnosis not present

## 2019-07-13 DIAGNOSIS — Z23 Encounter for immunization: Secondary | ICD-10-CM | POA: Diagnosis not present

## 2019-07-13 DIAGNOSIS — N186 End stage renal disease: Secondary | ICD-10-CM | POA: Diagnosis not present

## 2019-07-13 DIAGNOSIS — R110 Nausea: Secondary | ICD-10-CM | POA: Diagnosis not present

## 2019-07-13 DIAGNOSIS — Z992 Dependence on renal dialysis: Secondary | ICD-10-CM | POA: Diagnosis not present

## 2019-07-13 DIAGNOSIS — D509 Iron deficiency anemia, unspecified: Secondary | ICD-10-CM | POA: Diagnosis not present

## 2019-07-13 DIAGNOSIS — R112 Nausea with vomiting, unspecified: Secondary | ICD-10-CM | POA: Diagnosis not present

## 2019-07-14 ENCOUNTER — Ambulatory Visit: Payer: Medicare Other | Admitting: Urology

## 2019-07-14 ENCOUNTER — Ambulatory Visit (INDEPENDENT_AMBULATORY_CARE_PROVIDER_SITE_OTHER): Payer: Medicare Other | Admitting: Urology

## 2019-07-14 ENCOUNTER — Encounter: Payer: Self-pay | Admitting: Urology

## 2019-07-14 ENCOUNTER — Other Ambulatory Visit: Payer: Self-pay

## 2019-07-14 VITALS — BP 116/68 | HR 60 | Temp 97.7°F | Ht 76.0 in | Wt 230.0 lb

## 2019-07-14 DIAGNOSIS — I2510 Atherosclerotic heart disease of native coronary artery without angina pectoris: Secondary | ICD-10-CM | POA: Diagnosis not present

## 2019-07-14 DIAGNOSIS — N2889 Other specified disorders of kidney and ureter: Secondary | ICD-10-CM | POA: Diagnosis not present

## 2019-07-14 NOTE — Progress Notes (Signed)

## 2019-07-14 NOTE — Progress Notes (Signed)
07/14/2019 1:34 PM   [name] January 06, 1951 [id number]  Referring provider: Manon Hilding, MD [address],  [place] [address]  Right renal mass  HPI: Mr Tamburrino is a [phone] seen for evaluation of a right renal mass. He underwent CT angio on 05/12/2019 which showed an exophytic 1.6cm right renal lesion. No flank pain. No family hx of RCC. He is on hemodialysis.    PMH: Past Medical History:  Diagnosis Date  . AAA (abdominal aortic aneurysm) ([place]) 06/2016   Mild increase in size of 3.4 cm infrarenal abdominal aortic aneurysm  . Anemia   . Aortic atherosclerosis ([place])   . Asthma   . Atrial flutter ([place])   . AVF (arteriovenous fistula) ([facility])    Left forearm  . CAD (coronary artery disease)    a. s/p prior stenting of RCA in 1999 b. cath in 2003 showing moderate CAD c. NST in 2016 showing small area of ischemic and low-risk  . CHF (congestive heart failure) ([place])   . CKD (chronic kidney disease), stage V ([place])    kidney transplant evaluation  . COPD (chronic obstructive pulmonary disease) ([place])    with ongoing tobacco use and patient failed sham takes  . Diverticulosis 2018   Mild sigmoid colon diverticulosis.  Marland Kitchen DM2 (diabetes mellitus, type 2) ([place])   . Dyslipidemia   . Dysrhythmia 2018   atrial fibrillation  . Edema    chronic lower extremity secondary to right heart failure and chronic venous insufficiency  . Fatigue   . Fatty liver 2010   Mild  . Headache   . HTN (hypertension)   . Morbid obesity ([place])   . Multiple pulmonary nodules 05/2018   Bilateral  . Pneumonia   . Presence of permanent cardiac pacemaker   . 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   Marland Kitchen Reflux esophagitis    hx  . Sleep apnea    PT STATES ONE TEST WAS POSITIVE AND ANOTHER TEST WAS NEGATIVE  . Two-vessel coronary artery disease    moderate. by cath in 2003. Status post stenting of the mdi RCA November 1999 normal left ventricular  ejection fraction  . Wears glasses   . Wears hearing aid    B/L    Surgical History: Past Surgical History:  Procedure Laterality Date  . A/V FISTULAGRAM Left 04/30/2019   Procedure: A/V FISTULAGRAM;  Surgeon: Elam Dutch, MD;  Location: [facility];  Service: Cardiovascular;  Laterality: Left;  . ABDOMINAL SURGERY    . APPENDECTOMY    . AV FISTULA PLACEMENT Right 03/11/2018   Procedure: CREATION Brachiocephalic Fistula RIGHT ARM;  Surgeon: Rosetta Posner, MD;  Location[facility];  Service: Vascular;  Laterality: Right;  . AV FISTULA PLACEMENT Left 05/06/2019   Procedure: LEFT BRACHIOCEPHALIC ARTERIOVENOUS (AV) FISTULA CREATION;  Surgeon: Marty Heck, MD;  Location: [facility];  Service: Vascular;  Laterality: Left;  . BACK SURGERY     x3; cages in patient's back since 2000  . BIOPSY  11/12/2018   Procedure: BIOPSY;  Surgeon: Laurence Spates, MD;  Location: [facility];  Service: Endoscopy;;  . CARDIAC CATHETERIZATION     stent  . CHOLECYSTECTOMY    . CLOSED REDUCTION SHOULDER DISLOCATION    . COLONOSCOPY W/ BIOPSIES AND POLYPECTOMY    . COLONOSCOPY WITH PROPOFOL N/A 11/12/2018   Procedure: COLONOSCOPY WITH PROPOFOL;  Surgeon: Laurence Spates, MD;  Location: [facility];  Service: Endoscopy;  Laterality: N/A;  . CORONARY ANGIOPLASTY    . diabetic ulcers    . DIAGNOSTIC LAPAROSCOPY    . ESOPHAGOGASTRODUODENOSCOPY (EGD) WITH PROPOFOL N/A 11/12/2018   Procedure: ESOPHAGOGASTRODUODENOSCOPY (EGD) WITH PROPOFOL;  Surgeon: Laurence Spates, MD;  Location: [facility];  Service: Endoscopy;  Laterality: N/A;  . GROIN EXPLORATION    . HEMOSTASIS CLIP PLACEMENT  11/12/2018   Procedure: HEMOSTASIS CLIP PLACEMENT;  Surgeon: Laurence Spates, MD;  Location: [facility];  Service: Endoscopy;;  . INSERT / REPLACE / REMOVE PACEMAKER    . INSERTION OF ARTERIOVENOUS (AV) ARTEGRAFT ARM Left 03/18/2018   Procedure: INSERTION OF ARTERIOVENOUS (AV) FISTULA;  Surgeon: Rosetta Posner, MD;   Location: [facility];  Service: Vascular;  Laterality: Left;  . IR FLUORO GUIDE CV LINE RIGHT  04/11/2019  . IR US GUIDE VASC ACCESS RIGHT  04/11/2019  . LIGATION ARTERIOVENOUS GORTEX GRAFT Right 03/18/2018   Procedure: LIGATION ARTERIOVENOUS FISTULA;  Surgeon: Rosetta Posner, MD;  Location: [facility];  Service: Vascular;  Laterality: Right;  . OSTECTOMY Left 04/23/2017   Procedure: OSTECTOMY LEFT GREAT TOE;  Surgeon: [name], DPM;  Location: [facility];  Service: Podiatry;  Laterality: Left;  left great toe  . POLYPECTOMY  11/12/2018   Procedure: POLYPECTOMY;  Surgeon: Laurence Spates, MD;  Location: [facility];  Service: Endoscopy;;  . TUMOR REMOVAL     small intestine x3    Home Medications:  Allergies as of 07/14/2019   No Known Allergies     Medication List       Accurate as of July 14, 2019  1:34 PM. If you have any questions, ask your nurse or doctor.        acetaminophen 325 MG tablet Commonly known as: TYLENOL Take 975 mg by mouth every morning.   atorvastatin 40 MG tablet Commonly known as: LIPITOR Take 1 tablet (40 mg total) by mouth every evening. What changed: when to take this   beclomethasone 80 MCG/ACT inhaler Commonly known as: QVAR Inhale 2 puffs into the lungs 2 (two) times daily as needed (shortness of breath).   buPROPion 150 MG 12 hr tablet Commonly known as: WELLBUTRIN SR Take 150 mg by mouth 2 (two) times daily.   carvedilol 25 MG tablet Commonly known as: COREG TAKE ONE TABLET BY MOUTH TWICE DAILY. What changed: when to take this   diltiazem 30 MG tablet Commonly known as: CARDIZEM TAKE ONE TABLET BY MOUTH TWICE DAILY.   epoetin alfa 4000 UNIT/ML injection Commonly known as: EPOGEN Inject 4,000 Units into the vein See admin instructions. Infusion done as needed for hematocrit <10   hydrALAZINE 100 MG tablet Commonly known as: APRESOLINE TAKE ONE TABLET BY MOUTH THREE TIMES DAILY What changed:   how much to take  when to take  this  additional instructions   iron polysaccharides 150 MG capsule Commonly known as: NIFEREX Take 150 mg by mouth every morning.   Linzess 290 MCG Caps capsule Generic drug: linaclotide Take 290 mcg by mouth daily as needed (constipation).   Melatonin 10 MG Tabs Take 10 mg by mouth at bedtime as needed (sleep).   multivitamin with minerals Tabs tablet Take 1 tablet by mouth every morning.   omeprazole 20 MG capsule Commonly known as: PRILOSEC Take 20 mg by mouth every morning.   oxyCODONE-acetaminophen 5-325 MG tablet Commonly known as: Percocet Take 1 tablet by mouth every 6 (six) hours as needed for severe pain.   polyethylene glycol 17 g packet  Commonly known as: MIRALAX / GLYCOLAX Take 17 g by mouth every morning. Mix in morning coffee   potassium chloride SA 20 MEQ tablet Commonly known as: KLOR-CON Take 1 tablet (20 mEq total) by mouth daily. What changed: when to take this   Sennosides 25 MG Tabs Take 50 mg by mouth at bedtime.   sildenafil 100 MG tablet Commonly known as: VIAGRA Take 1 tablet (100 mg total) by mouth as needed for erectile dysfunction. What changed: when to take this   silver sulfADIAZINE 1 % cream Commonly known as: SILVADENE Apply 1 application topically daily as needed (For wound care with dressing).   Ventolin HFA 108 (90 Base) MCG/ACT inhaler Generic drug: albuterol Inhale 2 puffs into the lungs every 6 (six) hours as needed for wheezing or shortness of breath.   Vitamin D3 50 MCG (2000 UT) Tabs Take 2,000 Units by mouth every morning.       Allergies: No Known Allergies  Family History: Family History  Problem Relation Age of Onset  . Diabetes Mother   . Alcohol abuse Father     Social History:  reports that he has been smoking cigarettes. He started smoking about 51 years ago. He has a 40.00 pack-year smoking history. He has never used smokeless tobacco. He reports previous alcohol use. He reports that he does not use  drugs.  ROS: All other review of systems were reviewed and are negative except what is noted above in HPI  Physical Exam: BP 116/68   Pulse 60   Temp 97.7 F (36.5 C)   Ht 6\' 4"  (1.93 m)   Wt 230 lb (104.3 kg)   BMI 28.00 kg/m   Constitutional:  Alert and oriented, No acute distress. HEENT: [place] AT, moist mucus membranes.  Trachea midline, no masses. Cardiovascular: No clubbing, cyanosis, or edema. Respiratory: Normal respiratory effort, no increased work of breathing. GI: Abdomen is soft, nontender, nondistended, no abdominal masses GU: No CVA tenderness Lymph: No cervical or inguinal lymphadenopathy. Skin: No rashes, bruises or suspicious lesions. Neurologic: Grossly intact, no focal deficits, moving all 4 extremities. Psychiatric: Normal mood and affect.  Laboratory Data: Lab Results  Component Value Date   WBC 6.5 04/11/2019   HGB 13.9 05/06/2019   HCT 41.0 05/06/2019   MCV 101.4 (H) 04/11/2019   PLT 148 (L) 04/11/2019    Lab Results  Component Value Date   CREATININE 4.50 (H) 05/06/2019    No results found for: PSA  Lab Results  Component Value Date   TESTOSTERONE 297 07/28/2015    Lab Results  Component Value Date   HGBA1C 5.8 (H) 04/14/2017    Urinalysis    Component Value Date/Time   COLORURINE YELLOW 04/04/2016 1500   APPEARANCEUR CLEAR 04/04/2016 1500   LABSPEC 1.010 04/04/2016 1500   PHURINE 7.0 04/04/2016 1500   GLUCOSEU NEGATIVE 04/04/2016 1500   HGBUR MODERATE (A) 04/04/2016 1500   BILIRUBINUR NEGATIVE 04/04/2016 1500   KETONESUR NEGATIVE 04/04/2016 1500   PROTEINUR >300 (A) 04/04/2016 1500   UROBILINOGEN 0.2 09/27/2014 0027   NITRITE NEGATIVE 04/04/2016 1500   LEUKOCYTESUR NEGATIVE 04/04/2016 1500    Lab Results  Component Value Date   BACTERIA RARE (A) 04/04/2016    Pertinent Imaging: CT abd/pelvis from 12/23: images reviewed and disucssed the the patient No results found for this or any previous visit. No results found for  this or any previous visit. No results found for this or any previous visit. No results found for this or  any previous visit. Results for orders placed during the hospital encounter of 04/04/16  US Renal   Narrative CLINICAL DATA:  Hematuria.  EXAM: RENAL / URINARY TRACT ULTRASOUND COMPLETE  COMPARISON:  09/13/2015  FINDINGS: Right Kidney:  Length: 13.0 cm. Echogenicity within normal limits. No mass or hydronephrosis visualized.  Left Kidney:  Length: 13.8 cm. Echogenicity is normal. Suspect mild renal parenchymal thinning. In the mid aspect of the left renal pelvis and may represent a vascular calcification or intrarenal stone.  Bladder:  Appears normal for degree of bladder distention.  IMPRESSION: 1. No hydronephrosis. 2. Vascular calcification versus intrarenal calculus in the mid pole region of the left kidney. 3. Mild renal parenchymal thinning of the left kidney.   Electronically Signed   By: Nolon Nations M.D.   On: 04/05/2016 15:42    No results found for this or any previous visit. No results found for this or any previous visit. No results found for this or any previous visit.  Assessment & Plan:    1. Renal mass -CT abd w/wo contrast -RTC 3-4 weeks   No follow-ups on file.  Nicolette Bang, MD  [facility] [place]

## 2019-07-14 NOTE — Patient Instructions (Signed)
Renal Mass  A renal mass is a growth in the kidney. A renal mass may be found while performing an MRI, CT scan, or ultrasound for other problems of the abdomen. Certain types of cancers, infections, or injuries can cause a renal mass. A renal mass that is cancerous (malignant) may grow or spread quickly. Others are harmless (benign). What are common types of renal masses? Renal masses include:  Tumors. These may be cancerous (malignant) or noncancerous (benign). ? The most common type of kidney cancer is renal cell carcinoma. ? The most common benign tumors of the kidney include renal adenomas, oncocytomas, and angiomyolipoma (AML).  Cysts. These are fluid-filled sacs that form on or in the kidney. ? It is not always known what causes a cyst to develop in or on the kidney. ? Most kidney cysts do not cause symptoms and do not need to be treated. What type of testing might I need? Your health care provider may recommend that you have tests to diagnose the cause of your renal mass. The following tests may be done if a renal mass is found:  Physical exam.  Blood tests.  Urine tests.  Imaging tests, such as ultrasound, CT scan, or MRI.  Biopsy. This is a small sample that is removed from the renal mass and tested in a lab. The exact tests and how often they are done will depend on:  The size and appearance of the renal mass.  Risk factors or medical conditions that increase your risk for problems.  Any symptoms associated with the renal mass, or concerns that you have about it. Tests and physical exams may be done once, or they may be done regularly for a period of time. Tests and exams that are done regularly will help monitor whether the mass is growing and beginning to cause problems. What are common treatments for renal masses? Treatment is not always needed for this condition. Your health care provider may recommend careful monitoring (watchful waiting) and regular tests and exams.  Treatment will depend on the cause of the mass. Follow these instructions at home: What you need to do at home will depend on the cause of the mass. Follow the instructions that your health care provider gives to you. In general:  Take over-the-counter and prescription medicines only as told by your health care provider.  If you are prescribed an antibiotic medicine, take it as told by your health care provider. Do not stop taking the antibiotic even if you start to feel better.  Follow any restrictions that are given to you by your health care provider.  Keep all follow-up visits as told by your health care provider. This is important. ? You may need to see your health care provider once or twice a year to have CT scans and ultrasounds done. These tests will show if your renal mass has changed or grown bigger. Contact a health care provider if you:  Have pain in the side or back (flank pain).  Have a fever.  Feel full soon after eating.  Have pain or swelling in the abdomen.  Lose weight. Get help right away if:  Your pain gets worse.  There is blood in your urine.  You cannot urinate.  You have chest pain.  You have trouble breathing. Summary  A renal mass is a growth in the kidney. It may be cancerous (malignant) and grow or spread quickly, or it may be harmless (benign).  Renal masses may be found while performing   an MRI, CT scan, or ultrasound for other problems of the abdomen.  Your health care provider may recommend that you have tests to diagnose the cause of your renal mass. This may include a physical exam, blood tests, urine tests, imaging, or a biopsy.  Treatment is not always needed for this condition. Careful monitoring (watchful waiting) may be recommended. This information is not intended to replace advice given to you by your health care provider. Make sure you discuss any questions you have with your health care provider. Document Revised: 06/12/2017  Document Reviewed: 06/12/2017 Elsevier Patient Education  2020 [employer]  

## 2019-07-15 DIAGNOSIS — N186 End stage renal disease: Secondary | ICD-10-CM | POA: Diagnosis not present

## 2019-07-15 DIAGNOSIS — D509 Iron deficiency anemia, unspecified: Secondary | ICD-10-CM | POA: Diagnosis not present

## 2019-07-15 DIAGNOSIS — R112 Nausea with vomiting, unspecified: Secondary | ICD-10-CM | POA: Diagnosis not present

## 2019-07-15 DIAGNOSIS — R110 Nausea: Secondary | ICD-10-CM | POA: Diagnosis not present

## 2019-07-15 DIAGNOSIS — Z992 Dependence on renal dialysis: Secondary | ICD-10-CM | POA: Diagnosis not present

## 2019-07-15 DIAGNOSIS — Z23 Encounter for immunization: Secondary | ICD-10-CM | POA: Diagnosis not present

## 2019-07-16 DIAGNOSIS — E7849 Other hyperlipidemia: Secondary | ICD-10-CM | POA: Diagnosis not present

## 2019-07-16 DIAGNOSIS — I10 Essential (primary) hypertension: Secondary | ICD-10-CM | POA: Diagnosis not present

## 2019-07-17 DIAGNOSIS — Z992 Dependence on renal dialysis: Secondary | ICD-10-CM | POA: Diagnosis not present

## 2019-07-17 DIAGNOSIS — N186 End stage renal disease: Secondary | ICD-10-CM | POA: Diagnosis not present

## 2019-07-17 DIAGNOSIS — R112 Nausea with vomiting, unspecified: Secondary | ICD-10-CM | POA: Diagnosis not present

## 2019-07-17 DIAGNOSIS — D509 Iron deficiency anemia, unspecified: Secondary | ICD-10-CM | POA: Diagnosis not present

## 2019-07-17 DIAGNOSIS — R110 Nausea: Secondary | ICD-10-CM | POA: Diagnosis not present

## 2019-07-17 DIAGNOSIS — Z23 Encounter for immunization: Secondary | ICD-10-CM | POA: Diagnosis not present

## 2019-07-18 DIAGNOSIS — N186 End stage renal disease: Secondary | ICD-10-CM | POA: Diagnosis not present

## 2019-07-19 DIAGNOSIS — E1142 Type 2 diabetes mellitus with diabetic polyneuropathy: Secondary | ICD-10-CM | POA: Diagnosis not present

## 2019-07-19 DIAGNOSIS — E1122 Type 2 diabetes mellitus with diabetic chronic kidney disease: Secondary | ICD-10-CM | POA: Diagnosis not present

## 2019-07-19 DIAGNOSIS — S91109A Unspecified open wound of unspecified toe(s) without damage to nail, initial encounter: Secondary | ICD-10-CM | POA: Diagnosis not present

## 2019-07-19 DIAGNOSIS — R234 Changes in skin texture: Secondary | ICD-10-CM | POA: Diagnosis not present

## 2019-07-20 DIAGNOSIS — N2581 Secondary hyperparathyroidism of renal origin: Secondary | ICD-10-CM | POA: Diagnosis not present

## 2019-07-20 DIAGNOSIS — Z992 Dependence on renal dialysis: Secondary | ICD-10-CM | POA: Diagnosis not present

## 2019-07-20 DIAGNOSIS — N186 End stage renal disease: Secondary | ICD-10-CM | POA: Diagnosis not present

## 2019-07-20 DIAGNOSIS — D631 Anemia in chronic kidney disease: Secondary | ICD-10-CM | POA: Diagnosis not present

## 2019-07-20 DIAGNOSIS — R112 Nausea with vomiting, unspecified: Secondary | ICD-10-CM | POA: Diagnosis not present

## 2019-07-20 DIAGNOSIS — D509 Iron deficiency anemia, unspecified: Secondary | ICD-10-CM | POA: Diagnosis not present

## 2019-07-22 ENCOUNTER — Other Ambulatory Visit: Payer: Self-pay

## 2019-07-22 ENCOUNTER — Emergency Department (HOSPITAL_COMMUNITY): Payer: Medicare Other

## 2019-07-22 ENCOUNTER — Inpatient Hospital Stay (HOSPITAL_COMMUNITY)
Admission: EM | Admit: 2019-07-22 | Discharge: 2019-07-25 | DRG: 280 | Disposition: A | Discharge status: Home / Self Care | Payer: Medicare Other | Attending: Internal Medicine | Admitting: Internal Medicine

## 2019-07-22 ENCOUNTER — Encounter (HOSPITAL_COMMUNITY): Payer: Self-pay | Admitting: Pharmacy Technician

## 2019-07-22 DIAGNOSIS — I4892 Unspecified atrial flutter: Secondary | ICD-10-CM | POA: Diagnosis present

## 2019-07-22 DIAGNOSIS — K760 Fatty (change of) liver, not elsewhere classified: Secondary | ICD-10-CM | POA: Diagnosis present

## 2019-07-22 DIAGNOSIS — I700 Atherosclerosis of aorta: Secondary | ICD-10-CM | POA: Diagnosis present

## 2019-07-22 DIAGNOSIS — R778 Other specified abnormalities of plasma proteins: Secondary | ICD-10-CM

## 2019-07-22 DIAGNOSIS — D649 Anemia, unspecified: Secondary | ICD-10-CM | POA: Diagnosis present

## 2019-07-22 DIAGNOSIS — I2510 Atherosclerotic heart disease of native coronary artery without angina pectoris: Secondary | ICD-10-CM | POA: Diagnosis present

## 2019-07-22 DIAGNOSIS — Z7951 Long term (current) use of inhaled steroids: Secondary | ICD-10-CM

## 2019-07-22 DIAGNOSIS — R0789 Other chest pain: Secondary | ICD-10-CM | POA: Diagnosis not present

## 2019-07-22 DIAGNOSIS — G4733 Obstructive sleep apnea (adult) (pediatric): Secondary | ICD-10-CM | POA: Diagnosis present

## 2019-07-22 DIAGNOSIS — R918 Other nonspecific abnormal finding of lung field: Secondary | ICD-10-CM | POA: Diagnosis present

## 2019-07-22 DIAGNOSIS — F411 Generalized anxiety disorder: Secondary | ICD-10-CM | POA: Diagnosis present

## 2019-07-22 DIAGNOSIS — I10 Essential (primary) hypertension: Secondary | ICD-10-CM | POA: Diagnosis present

## 2019-07-22 DIAGNOSIS — N2581 Secondary hyperparathyroidism of renal origin: Secondary | ICD-10-CM | POA: Diagnosis not present

## 2019-07-22 DIAGNOSIS — N186 End stage renal disease: Secondary | ICD-10-CM | POA: Diagnosis present

## 2019-07-22 DIAGNOSIS — J449 Chronic obstructive pulmonary disease, unspecified: Secondary | ICD-10-CM | POA: Diagnosis present

## 2019-07-22 DIAGNOSIS — I5032 Chronic diastolic (congestive) heart failure: Secondary | ICD-10-CM | POA: Diagnosis present

## 2019-07-22 DIAGNOSIS — R112 Nausea with vomiting, unspecified: Secondary | ICD-10-CM | POA: Diagnosis not present

## 2019-07-22 DIAGNOSIS — D509 Iron deficiency anemia, unspecified: Secondary | ICD-10-CM | POA: Diagnosis not present

## 2019-07-22 DIAGNOSIS — D631 Anemia in chronic kidney disease: Secondary | ICD-10-CM | POA: Diagnosis not present

## 2019-07-22 DIAGNOSIS — N250 Renal osteodystrophy: Secondary | ICD-10-CM | POA: Diagnosis present

## 2019-07-22 DIAGNOSIS — Z8719 Personal history of other diseases of the digestive system: Secondary | ICD-10-CM

## 2019-07-22 DIAGNOSIS — Z955 Presence of coronary angioplasty implant and graft: Secondary | ICD-10-CM

## 2019-07-22 DIAGNOSIS — E1151 Type 2 diabetes mellitus with diabetic peripheral angiopathy without gangrene: Secondary | ICD-10-CM | POA: Diagnosis present

## 2019-07-22 DIAGNOSIS — K219 Gastro-esophageal reflux disease without esophagitis: Secondary | ICD-10-CM | POA: Diagnosis present

## 2019-07-22 DIAGNOSIS — E785 Hyperlipidemia, unspecified: Secondary | ICD-10-CM | POA: Diagnosis present

## 2019-07-22 DIAGNOSIS — Z9049 Acquired absence of other specified parts of digestive tract: Secondary | ICD-10-CM

## 2019-07-22 DIAGNOSIS — I252 Old myocardial infarction: Secondary | ICD-10-CM | POA: Diagnosis present

## 2019-07-22 DIAGNOSIS — R7989 Other specified abnormal findings of blood chemistry: Secondary | ICD-10-CM | POA: Diagnosis not present

## 2019-07-22 DIAGNOSIS — I714 Abdominal aortic aneurysm, without rupture: Secondary | ICD-10-CM | POA: Diagnosis present

## 2019-07-22 DIAGNOSIS — Z981 Arthrodesis status: Secondary | ICD-10-CM

## 2019-07-22 DIAGNOSIS — Z992 Dependence on renal dialysis: Secondary | ICD-10-CM | POA: Diagnosis not present

## 2019-07-22 DIAGNOSIS — R1013 Epigastric pain: Secondary | ICD-10-CM

## 2019-07-22 DIAGNOSIS — E1122 Type 2 diabetes mellitus with diabetic chronic kidney disease: Secondary | ICD-10-CM | POA: Diagnosis present

## 2019-07-22 DIAGNOSIS — Z811 Family history of alcohol abuse and dependence: Secondary | ICD-10-CM

## 2019-07-22 DIAGNOSIS — F17210 Nicotine dependence, cigarettes, uncomplicated: Secondary | ICD-10-CM | POA: Diagnosis present

## 2019-07-22 DIAGNOSIS — Z833 Family history of diabetes mellitus: Secondary | ICD-10-CM

## 2019-07-22 DIAGNOSIS — I132 Hypertensive heart and chronic kidney disease with heart failure and with stage 5 chronic kidney disease, or end stage renal disease: Secondary | ICD-10-CM | POA: Diagnosis present

## 2019-07-22 DIAGNOSIS — Z79890 Hormone replacement therapy (postmenopausal): Secondary | ICD-10-CM

## 2019-07-22 DIAGNOSIS — I480 Paroxysmal atrial fibrillation: Secondary | ICD-10-CM | POA: Diagnosis present

## 2019-07-22 DIAGNOSIS — Z79899 Other long term (current) drug therapy: Secondary | ICD-10-CM

## 2019-07-22 DIAGNOSIS — Z950 Presence of cardiac pacemaker: Secondary | ICD-10-CM

## 2019-07-22 DIAGNOSIS — R079 Chest pain, unspecified: Secondary | ICD-10-CM

## 2019-07-22 DIAGNOSIS — I214 Non-ST elevation (NSTEMI) myocardial infarction: Principal | ICD-10-CM | POA: Diagnosis present

## 2019-07-22 DIAGNOSIS — Z20822 Contact with and (suspected) exposure to covid-19: Secondary | ICD-10-CM | POA: Diagnosis present

## 2019-07-22 DIAGNOSIS — Z86010 Personal history of colonic polyps: Secondary | ICD-10-CM

## 2019-07-22 DIAGNOSIS — R109 Unspecified abdominal pain: Secondary | ICD-10-CM | POA: Diagnosis not present

## 2019-07-22 LAB — LIPASE, BLOOD: Lipase: 38 U/L (ref 11–51)

## 2019-07-22 LAB — URINALYSIS, ROUTINE W REFLEX MICROSCOPIC
Bilirubin Urine: NEGATIVE
Glucose, UA: NEGATIVE mg/dL
Hgb urine dipstick: NEGATIVE
Ketones, ur: NEGATIVE mg/dL
Leukocytes,Ua: NEGATIVE
Nitrite: NEGATIVE
Protein, ur: >=300 mg/dL — AB
Specific Gravity, Urine: 1.01 (ref 1.005–1.030)
pH: 6 (ref 5.0–8.0)

## 2019-07-22 LAB — CBC
HCT: 40.8 % (ref 39.0–52.0)
Hemoglobin: 13.4 g/dL (ref 13.0–17.0)
MCH: 34.4 pg — ABNORMAL HIGH (ref 26.0–34.0)
MCHC: 32.8 g/dL (ref 30.0–36.0)
MCV: 104.6 fL — ABNORMAL HIGH (ref 80.0–100.0)
Platelets: 202 K/uL (ref 150–400)
RBC: 3.9 MIL/uL — ABNORMAL LOW (ref 4.22–5.81)
RDW: 17 % — ABNORMAL HIGH (ref 11.5–15.5)
WBC: 9.7 K/uL (ref 4.0–10.5)
nRBC: 0.3 % — ABNORMAL HIGH (ref 0.0–0.2)

## 2019-07-22 LAB — COMPREHENSIVE METABOLIC PANEL
ALT: 15 U/L (ref 0–44)
AST: 19 U/L (ref 15–41)
Albumin: 3.6 g/dL (ref 3.5–5.0)
Alkaline Phosphatase: 59 U/L (ref 38–126)
Anion gap: 14 (ref 5–15)
BUN: 20 mg/dL (ref 8–23)
CO2: 28 mmol/L (ref 22–32)
Calcium: 9 mg/dL (ref 8.9–10.3)
Chloride: 91 mmol/L — ABNORMAL LOW (ref 98–111)
Creatinine, Ser: 5.32 mg/dL — ABNORMAL HIGH (ref 0.61–1.24)
GFR calc Af Amer: 12 mL/min — ABNORMAL LOW (ref >60)
GFR calc non Af Amer: 10 mL/min — ABNORMAL LOW (ref >60)
Glucose, Bld: 115 mg/dL — ABNORMAL HIGH (ref 70–99)
Potassium: 4.3 mmol/L (ref 3.5–5.1)
Sodium: 133 mmol/L — ABNORMAL LOW (ref 135–145)
Total Bilirubin: 0.7 mg/dL (ref 0.3–1.2)
Total Protein: 8 g/dL (ref 6.5–8.1)

## 2019-07-22 LAB — HIV ANTIBODY (ROUTINE TESTING W REFLEX): HIV Screen 4th Generation wRfx: NONREACTIVE

## 2019-07-22 LAB — LACTIC ACID, PLASMA: Lactic Acid, Venous: 1.6 mmol/L (ref 0.5–1.9)

## 2019-07-22 LAB — TROPONIN I (HIGH SENSITIVITY)
Troponin I (High Sensitivity): 229 ng/L (ref <18)
Troponin I (High Sensitivity): 209 ng/L (ref <18)

## 2019-07-22 MED ORDER — ASPIRIN 81 MG PO CHEW: 324.0000 mg | CHEWABLE_TABLET | ORAL | Status: DC

## 2019-07-22 MED ORDER — MELATONIN 3 MG PO TABS
9.0000 mg | ORAL_TABLET | Freq: Every evening | ORAL | Status: DC | PRN
  Administered 2019-07-23 – 2019-07-24 (×2): 9 mg via ORAL
  Filled 2019-07-22 (×3): qty 3

## 2019-07-22 MED ORDER — HEPARIN (PORCINE) 25000 UT/250ML-% IV SOLN
1400.0000 [IU]/h | INTRAVENOUS | Status: DC
  Administered 2019-07-22: 1200 [IU]/h via INTRAVENOUS
  Filled 2019-07-22: qty 250

## 2019-07-22 MED ORDER — SODIUM CHLORIDE 0.9% FLUSH: 3.0000 mL | INTRAVENOUS | Status: DC | PRN

## 2019-07-22 MED ORDER — SODIUM CHLORIDE 0.9% FLUSH
3.0000 mL | Freq: Once | INTRAVENOUS | Status: AC
  Administered 2019-07-22: 3 mL via INTRAVENOUS

## 2019-07-22 MED ORDER — ASPIRIN 81 MG PO CHEW
324.0000 mg | CHEWABLE_TABLET | Freq: Once | ORAL | Status: AC
  Administered 2019-07-22: 324 mg via ORAL
  Filled 2019-07-22: qty 4

## 2019-07-22 MED ORDER — ASPIRIN EC 81 MG PO TBEC
81.0000 mg | DELAYED_RELEASE_TABLET | Freq: Every day | ORAL | Status: DC
  Administered 2019-07-23 – 2019-07-25 (×3): 81 mg via ORAL
  Filled 2019-07-22 (×3): qty 1

## 2019-07-22 MED ORDER — ASPIRIN 300 MG RE SUPP: 300.0000 mg | RECTAL | Status: DC

## 2019-07-22 MED ORDER — CARVEDILOL 25 MG PO TABS
25.0000 mg | ORAL_TABLET | Freq: Two times a day (BID) | ORAL | Status: DC
  Administered 2019-07-22 – 2019-07-25 (×5): 25 mg via ORAL
  Filled 2019-07-22 (×4): qty 1
  Filled 2019-07-22: qty 2

## 2019-07-22 MED ORDER — ALBUTEROL SULFATE (2.5 MG/3ML) 0.083% IN NEBU: 2.5000 mg | INHALATION_SOLUTION | Freq: Four times a day (QID) | RESPIRATORY_TRACT | Status: DC | PRN

## 2019-07-22 MED ORDER — HEPARIN BOLUS VIA INFUSION
4000.0000 [IU] | Freq: Once | INTRAVENOUS | Status: AC
  Administered 2019-07-22: 4000 [IU] via INTRAVENOUS
  Filled 2019-07-22: qty 4000

## 2019-07-22 MED ORDER — BUMETANIDE 2 MG PO TABS
2.0000 mg | ORAL_TABLET | Freq: Two times a day (BID) | ORAL | Status: DC
  Administered 2019-07-22 – 2019-07-25 (×6): 2 mg via ORAL
  Filled 2019-07-22 (×6): qty 1

## 2019-07-22 MED ORDER — SODIUM CHLORIDE 0.9% FLUSH
3.0000 mL | Freq: Two times a day (BID) | INTRAVENOUS | Status: DC
  Administered 2019-07-22 – 2019-07-25 (×4): 3 mL via INTRAVENOUS

## 2019-07-22 MED ORDER — ONDANSETRON HCL 4 MG/2ML IJ SOLN: 4.0000 mg | Freq: Four times a day (QID) | INTRAMUSCULAR | Status: DC | PRN

## 2019-07-22 MED ORDER — ACETAMINOPHEN 325 MG PO TABS: 650.0000 mg | ORAL_TABLET | ORAL | Status: DC | PRN

## 2019-07-22 MED ORDER — HYDROMORPHONE HCL 1 MG/ML IJ SOLN
0.5000 mg | Freq: Once | INTRAMUSCULAR | Status: AC
  Administered 2019-07-22: 0.5 mg via INTRAVENOUS
  Filled 2019-07-22: qty 1

## 2019-07-22 MED ORDER — NITROGLYCERIN 0.4 MG SL SUBL: 0.4000 mg | SUBLINGUAL_TABLET | SUBLINGUAL | Status: DC | PRN

## 2019-07-22 MED ORDER — ATORVASTATIN CALCIUM 40 MG PO TABS
40.0000 mg | ORAL_TABLET | Freq: Every evening | ORAL | Status: DC
  Administered 2019-07-22 – 2019-07-24 (×3): 40 mg via ORAL
  Filled 2019-07-22 (×3): qty 1

## 2019-07-22 MED ORDER — POLYETHYLENE GLYCOL 3350 17 G PO PACK
17.0000 g | PACK | Freq: Every day | ORAL | Status: DC | PRN
  Administered 2019-07-24: 17 g via ORAL
  Filled 2019-07-22: qty 1

## 2019-07-22 MED ORDER — PANTOPRAZOLE SODIUM 40 MG PO TBEC
40.0000 mg | DELAYED_RELEASE_TABLET | Freq: Every day | ORAL | Status: DC
  Administered 2019-07-23 – 2019-07-25 (×3): 40 mg via ORAL
  Filled 2019-07-22 (×2): qty 1
  Filled 2019-07-22: qty 2

## 2019-07-22 MED ORDER — IOHEXOL 350 MG/ML SOLN
100.0000 mL | Freq: Once | INTRAVENOUS | Status: AC | PRN
  Administered 2019-07-22: 100 mL via INTRAVENOUS

## 2019-07-22 MED ORDER — DILTIAZEM HCL 30 MG PO TABS
30.0000 mg | ORAL_TABLET | Freq: Two times a day (BID) | ORAL | Status: DC
  Administered 2019-07-22 – 2019-07-25 (×5): 30 mg via ORAL
  Filled 2019-07-22 (×6): qty 1

## 2019-07-22 MED ORDER — SODIUM CHLORIDE 0.9 % IV SOLN: 250.0000 mL | INTRAVENOUS | Status: DC | PRN

## 2019-07-22 NOTE — ED Notes (Signed)
Pt st's CT was not done due to kidney functions test being elevated.  St's they will talk to MD

## 2019-07-22 NOTE — ED Provider Notes (Signed)
[facility] EMERGENCY DEPARTMENT Provider Note   CSN: [id number] Arrival date & time: 07/22/19  1320     History Chief Complaint  Patient presents with  . Abdominal Pain  . Chest Pain    [name] is a 69 y.o. male.  The history is provided by the patient and medical records.   The patient is a 69 year old male with a past medical history of AAA, atrial fibrillation, coronary artery disease, heart failure, ESRD on dialysis Tuesday/Thursday/Saturday, peripheral vascular disease who presents to the ED for epigastric and lower chest pain.  Symptoms started today at 1115 immediately after receiving dialysis.  He reports that his symptoms come each time he finishes dialysis and he has spoken with his physicians about this.  The pain is tightness, worse with ambulation, questionably worse with eating.  Better with rest in certain positions.  Symptoms are constant, symptoms are improving.  He reports that his symptoms today were worse than typical which is why he decided to come to the emergency department.  He has had N/V, no diarrhea, no hematochezia or melena.  He denies any shortness of breath, fever, chills, cough.    Past Medical History:  Diagnosis Date  . AAA (abdominal aortic aneurysm) ([place]) 06/2016   Mild increase in size of 3.4 cm infrarenal abdominal aortic aneurysm  . Anemia   . Aortic atherosclerosis ([place])   . Asthma   . Atrial flutter ([place])   . AVF (arteriovenous fistula) (H[facility])    Left forearm  . CAD (coronary artery disease)    a. s/p prior stenting of RCA in 1999 b. cath in 2003 showing moderate CAD c. NST in 2016 showing small area of ischemic and low-risk  . CHF (congestive heart failure) ([place])   . CKD (chronic kidney disease), stage V ([place])    kidney transplant evaluation  . COPD (chronic obstructive pulmonary disease) ([place])    with ongoing tobacco use and patient failed sham takes  . Diverticulosis 2018   Mild sigmoid colon diverticulosis.    Marland Kitchen DM2 (diabetes mellitus, type 2) ([place])   . Dyslipidemia   . Dysrhythmia 2018   atrial fibrillation  . Edema    chronic lower extremity secondary to right heart failure and chronic venous insufficiency  . Fatigue   . Fatty liver 2010   Mild  . Headache   . HTN (hypertension)   . Morbid obesity ([place])   . Multiple pulmonary nodules 05/2018   Bilateral  . Pneumonia   . Presence of permanent cardiac pacemaker   . 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   Marland Kitchen Reflux esophagitis    hx  . Sleep apnea    PT STATES ONE TEST WAS POSITIVE AND ANOTHER TEST WAS NEGATIVE  . Two-vessel coronary artery disease    moderate. by cath in 2003. Status post stenting of the mdi RCA November 1999 normal left ventricular ejection fraction  . Wears glasses   . Wears hearing aid    B/L    Patient Active Problem List   Diagnosis Date Noted  . NSTEMI (non-ST elevated myocardial infarction) ([place]) 07/22/2019  . Renal mass 07/14/2019  . Renal anasarca   . ESRD (end stage renal disease) ([place]) 04/10/2019  . 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[place]) 03/18/2018  . Chronic renal insufficiency, stage 3 (moderate) 03/18/2018  . Diffuse wheezing 12/02/2017  . OSA and COPD overlap syndrome ([place]) 12/02/2017  . Sleeps in  sitting position due to orthopnea 12/02/2017  . Pacemaker 12/02/2017  . Bradycardia with 31-40 beats per minute 12/02/2017  . Coronary artery disease due to calcified coronary lesion 12/02/2017  . Insomnia due to medical condition 12/02/2017  . Snoring 12/02/2017  . Chronic diastolic CHF (congestive heart failure) ([place]) 05/10/2017  . Atypical chest pain   . Chest pain 05/09/2017  . Atrial flutter ([place]) 05/09/2017  . Acute renal failure superimposed on stage 4 chronic kidney disease ([place]) 05/09/2017  . Hypokalemia 04/05/2016  . Diabetes mellitus with nephropathy ([place]) 04/05/2016  . Hyponatremia 04/05/2016  . Hematuria 04/05/2016  .  Diarrhea 04/05/2016  . Bilateral diabetic foot ulcer associated with secondary diabetes mellitus ([place]) 04/05/2016  . Streptococcal bacteremia 04/05/2016  . Cellulitis of leg, left 04/05/2016  . Sepsis ([place]) 04/04/2016  . CAD (coronary artery disease) 11/10/2015  . Bilateral lower extremity edema 07/25/2015  . Foot infection 09/27/2014  . Diabetic foot infection ([place]) 09/27/2014  . CKD stage 3 due to type 2 diabetes mellitus ([place])   . Diabetes mellitus with renal manifestations, uncontrolled ([place])   . Benign essential HTN   . ED (erectile dysfunction) 06/09/2012  . Hypertension 03/11/2011  . OSTEOMYELITIS, ACUTE, ANKLE/FOOT 07/24/2010  . EDEMA 02/15/2010  . MORBID OBESITY 07/05/2009  . TOBACCO ABUSE 07/05/2009  . OBSTRUCTIVE SLEEP APNEA 07/05/2009  . COPD (chronic obstructive pulmonary disease) ([place]) 07/05/2009  . 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 HYPERLIPIDEMIA 03/30/2008  . Generalized anxiety disorder 03/30/2008  . [place] DISEASE 03/30/2008    Past Surgical History:  Procedure Laterality Date  . A/V FISTULAGRAM Left 04/30/2019   Procedure: A/V FISTULAGRAM;  Surgeon: Elam Dutch, MD;  Location: [facility];  Service: Cardiovascular;  Laterality: Left;  . ABDOMINAL SURGERY    . APPENDECTOMY    . AV FISTULA PLACEMENT Right 03/11/2018   Procedure: CREATION Brachiocephalic Fistula RIGHT ARM;  Surgeon: Rosetta Posner, MD;  Location: [facility];  Service: Vascular;  Laterality: Right;  . AV FISTULA PLACEMENT Left 05/06/2019   Procedure: LEFT BRACHIOCEPHALIC ARTERIOVENOUS (AV) FISTULA CREATION;  Surgeon: Marty Heck, MD;  Location: [facility];  Service: Vascular;  Laterality: Left;  . BACK SURGERY     x3; cages in patient's back since 2000  . BIOPSY  11/12/2018   Procedure: BIOPSY;  Surgeon: Laurence Spates, MD;  Location: [facility];  Service: Endoscopy;;  . CARDIAC CATHETERIZATION     stent  . CHOLECYSTECTOMY    . CLOSED REDUCTION SHOULDER DISLOCATION    . COLONOSCOPY W/  BIOPSIES AND POLYPECTOMY    . COLONOSCOPY WITH PROPOFOL N/A 11/12/2018   Procedure: COLONOSCOPY WITH PROPOFOL;  Surgeon: Laurence Spates, MD;  Location: [facility];  Service: Endoscopy;  Laterality: N/A;  . CORONARY ANGIOPLASTY    . diabetic ulcers    . DIAGNOSTIC LAPAROSCOPY    . ESOPHAGOGASTRODUODENOSCOPY (EGD) WITH PROPOFOL N/A 11/12/2018   Procedure: ESOPHAGOGASTRODUODENOSCOPY (EGD) WITH PROPOFOL;  Surgeon: Laurence Spates, MD;  Location: [facility];  Service: Endoscopy;  Laterality: N/A;  . GROIN EXPLORATION    . HEMOSTASIS CLIP PLACEMENT  11/12/2018   Procedure: HEMOSTASIS CLIP PLACEMENT;  Surgeon: Laurence Spates, MD;  Location: [facility];  Service: Endoscopy;;  . INSERT / REPLACE / REMOVE PACEMAKER    . INSERTION OF ARTERIOVENOUS (AV) ARTEGRAFT ARM Left 03/18/2018   Procedure: INSERTION OF ARTERIOVENOUS (AV) FISTULA;  Surgeon: Rosetta Posner, MD;  Location: [facility];  Service: Vascular;  Laterality: Left;  . IR FLUORO GUIDE CV LINE RIGHT  04/11/2019  .  IR US GUIDE VASC ACCESS RIGHT  04/11/2019  . LIGATION ARTERIOVENOUS GORTEX GRAFT Right 03/18/2018   Procedure: LIGATION ARTERIOVENOUS FISTULA;  Surgeon: Rosetta Posner, MD;  Location: [facility];  Service: Vascular;  Laterality: Right;  . OSTECTOMY Left 04/23/2017   Procedure: OSTECTOMY LEFT GREAT TOE;  Surgeon: [name], DPM;  Location: [facility];  Service: Podiatry;  Laterality: Left;  left great toe  . POLYPECTOMY  11/12/2018   Procedure: POLYPECTOMY;  Surgeon: Laurence Spates, MD;  Location: [facility];  Service: Endoscopy;;  . TUMOR REMOVAL     small intestine x3       Family History  Problem Relation Age of Onset  . Diabetes Mother   . Alcohol abuse Father     Social History   Tobacco Use  . Smoking status: Current Every Day Smoker    Packs/day: 1.00    Years: 40.00    Pack years: 40.00    Types: Cigarettes    Start date: 05/20/1968  . Smokeless tobacco: Never Used  Substance Use Topics  . Alcohol use: Not  Currently    Alcohol/week: 0.0 standard drinks    Comment: rare  . Drug use: No    Home Medications Prior to Admission medications   Medication Sig Start Date End Date Taking? Authorizing Provider  acetaminophen (TYLENOL) 325 MG tablet Take 975 mg by mouth every morning.    Yes [provider]  albuterol (VENTOLIN HFA) 108 (90 BASE) MCG/ACT inhaler Inhale 2 puffs into the lungs every 6 (six) hours as needed for wheezing or shortness of breath.    Yes [provider]  atorvastatin (LIPITOR) 40 MG tablet Take 1 tablet (40 mg total) by mouth every evening. 08/24/18  Yes BranchAlphonse Guild, MD  beclomethasone (QVAR) 80 MCG/ACT inhaler Inhale 2 puffs into the lungs 2 (two) times daily as needed (shortness of breath).   Yes [provider]  bumetanide (BUMEX) 2 MG tablet Take 2 mg by mouth 2 (two) times daily. 07/15/19  Yes [provider]  buPROPion (WELLBUTRIN SR) 150 MG 12 hr tablet Take 150 mg by mouth 2 (two) times daily.   Yes [provider]  carvedilol (COREG) 25 MG tablet TAKE ONE TABLET BY MOUTH TWICE DAILY. Patient taking differently: Take 25 mg by mouth 2 (two) times daily with a meal.  02/15/19  Yes Branch, Alphonse Guild, MD  Cholecalciferol (VITAMIN D3) 2000 units TABS Take 2,000 Units by mouth every morning.    Yes [provider]  diltiazem (CARDIZEM) 30 MG tablet TAKE ONE TABLET BY MOUTH TWICE DAILY. Patient taking differently: Take 30 mg by mouth 2 (two) times daily.  02/15/19  Yes BranchAlphonse Guild, MD  iron polysaccharides (NIFEREX) 150 MG capsule Take 150 mg by mouth every morning.   Yes [provider]  linaclotide (LINZESS) 290 MCG CAPS capsule Take 290 mcg by mouth daily as needed (constipation).   Yes [provider]  Melatonin 10 MG TABS Take 10 mg by mouth at bedtime as needed (sleep).    Yes [provider]  Multiple Vitamin (MULTIVITAMIN WITH MINERALS) TABS tablet Take 1 tablet by mouth every  morning.    Yes [provider]  omeprazole (PRILOSEC) 20 MG capsule Take 20 mg by mouth every morning.    Yes [provider]  polyethylene glycol (MIRALAX / GLYCOLAX) packet Take 17 g by mouth daily as needed for mild constipation. Mix in morning coffee   Yes [provider]  potassium  chloride SA (K-DUR,KLOR-CON) 20 MEQ tablet Take 1 tablet (20 mEq total) by mouth daily. Patient taking differently: Take 20 mEq by mouth every morning.  05/10/17  Yes Tat, Shanon Brow, MD  Sennosides 25 MG TABS Take 50 mg by mouth at bedtime.   Yes [provider]  sildenafil (REVATIO) 20 MG tablet Take 20 mg by mouth daily as needed (Erectile dysfunction).  07/09/19  Yes [provider]  sildenafil (VIAGRA) 100 MG tablet Take 1 tablet (100 mg total) by mouth as needed for erectile dysfunction. Patient taking differently: Take 100 mg by mouth daily as needed for erectile dysfunction.  01/23/18  Yes Allred, Jeneen Rinks, MD  silver sulfADIAZINE (SILVADENE) 1 % cream Apply 1 application topically daily as needed (For wound care with dressing).    Yes [provider]  doxycycline (VIBRA-TABS) 100 MG tablet Take 100 mg by mouth 2 (two) times daily. 07/19/19   [provider]  hydrALAZINE (APRESOLINE) 100 MG tablet TAKE ONE TABLET BY MOUTH THREE TIMES DAILY Patient not taking: Take two tablets (200 mg) by mouth every morning and one tablet (100 mg) at night 02/15/19   Arnoldo Lenis, MD  oxyCODONE-acetaminophen (PERCOCET) 5-325 MG tablet Take 1 tablet by mouth every 6 (six) hours as needed for severe pain. Patient not taking: Reported on 07/14/2019 05/06/19 05/05/20  Ulyses Amor, PA-C    Allergies    Patient has no known allergies.  Review of Systems   Review of Systems  Constitutional: Negative for chills and fever.  Respiratory: Negative for cough and shortness of breath.   Cardiovascular: Positive for chest pain.  Gastrointestinal: Positive for abdominal pain,  nausea and vomiting. Negative for blood in stool and diarrhea.  Genitourinary: Negative for dysuria, flank pain and frequency.  All other systems reviewed and are negative.   Physical Exam Updated Vital Signs BP 119/67 (BP Location: Right Arm)   Pulse (!) 59   Temp 97.6 F (36.4 C) (Oral)   Resp 20   Ht 6\' 4"  (1.93 m)   Wt 102.3 kg   SpO2 98%   BMI 27.46 kg/m   Physical Exam Vitals and nursing note reviewed.  Constitutional:      Appearance: He is well-developed. He is not toxic-appearing or diaphoretic.  HENT:     Head: Normocephalic and atraumatic.  Eyes:     Conjunctiva/sclera: Conjunctivae normal.     Pupils: Pupils are equal, round, and reactive to light.  Cardiovascular:     Rate and Rhythm: Normal rate and regular rhythm.     Pulses:          Radial pulses are 2+ on the right side and 2+ on the left side.       Posterior tibial pulses are 2+ on the right side and 2+ on the left side.     Heart sounds: No murmur.  Pulmonary:     Effort: Pulmonary effort is normal. No respiratory distress.     Breath sounds: Normal breath sounds.  Chest:     Chest wall: No tenderness.  Abdominal:     Palpations: Abdomen is soft. Mass: epigastric.     Tenderness: There is abdominal tenderness. There is no guarding or rebound.  Musculoskeletal:     Cervical back: Neck supple.     Right lower leg: No tenderness.     Left lower leg: No tenderness.  Skin:    General: Skin is warm and dry.  Neurological:     General: No focal deficit  present.     Mental Status: He is alert and oriented to person, place, and time.  Psychiatric:        Mood and Affect: Mood normal.        Behavior: Behavior normal.     ED Results / Procedures / Treatments   Labs (all labs ordered are listed, but only abnormal results are displayed) Labs Reviewed  COMPREHENSIVE METABOLIC PANEL - Abnormal; Notable for the following components:      Result Value   Sodium 133 (*)    Chloride 91 (*)    Glucose,  Bld 115 (*)    Creatinine, Ser 5.32 (*)    GFR calc non Af Amer 10 (*)    GFR calc Af Amer 12 (*)    All other components within normal limits  CBC - Abnormal; Notable for the following components:   RBC 3.90 (*)    MCV 104.6 (*)    MCH 34.4 (*)    RDW 17.0 (*)    nRBC 0.3 (*)    All other components within normal limits  URINALYSIS, ROUTINE W REFLEX MICROSCOPIC - Abnormal; Notable for the following components:   APPearance HAZY (*)    Protein, ur >=300 (*)    Bacteria, UA RARE (*)    All other components within normal limits  TROPONIN I (HIGH SENSITIVITY) - Abnormal; Notable for the following components:   Troponin I (High Sensitivity) 229 (*)    All other components within normal limits  TROPONIN I (HIGH SENSITIVITY) - Abnormal; Notable for the following components:   Troponin I (High Sensitivity) 209 (*)    All other components within normal limits  SARS CORONAVIRUS 2 (TAT 6-24 HRS)  LIPASE, BLOOD  LACTIC ACID, PLASMA  HIV ANTIBODY (ROUTINE TESTING W REFLEX)  HEPARIN LEVEL (UNFRACTIONATED)  CBC  BASIC METABOLIC PANEL  LIPID PANEL    EKG EKG Interpretation  Date/Time:  Thursday July 22 2019 13:36:00 EST Ventricular Rate:  69 PR Interval:  184 QRS Duration: 94 QT Interval:  402 QTC Calculation: 430 R Axis:   92 Text Interpretation: Normal sinus rhythm Rightward axis Borderline ECG significant wandering baseline, No STEMI Confirmed by Antony Blackbird [phone]) on 07/22/2019 4:03:06 PM   Radiology CT Angio Chest/Abd/Pel for Dissection W and/or Wo Contrast  Result Date: 07/22/2019 CLINICAL DATA:  Chest pain and abdominal pain for 1 day EXAM: CT ANGIOGRAPHY CHEST, ABDOMEN AND PELVIS TECHNIQUE: Multidetector CT imaging through the chest, abdomen and pelvis was performed using the standard protocol during bolus administration of intravenous contrast. Multiplanar reconstructed images and MIPs were obtained and reviewed to evaluate the vascular anatomy. CONTRAST:  131mL OMNIPAQUE  IOHEXOL 350 MG/ML SOLN COMPARISON:  05/12/2019 FINDINGS: CTA CHEST FINDINGS Cardiovascular: Thoracic aorta demonstrates atherosclerotic calcifications without aneurysmal dilatation or dissection. No significant cardiac enlargement is seen. Heavy coronary calcifications are noted. The pulmonary artery shows a normal branching pattern without intraluminal filling defect to suggest pulmonary embolism. Right jugular dialysis catheter is noted in satisfactory position. Mediastinum/Nodes: Thoracic inlet is within normal limits. No hilar or mediastinal adenopathy is noted. The esophagus as visualized is within normal limits. Lungs/Pleura: Lungs are well aerated bilaterally. There is a subpleural nodule in the left upper lobe best seen on image number 63 of series 8. This measures approximately 12 mm and is stable from the prior exam. Stable smaller nodules are noted within the left lung is well. No new focal nodule is seen. Mild scarring is noted in the right base. Musculoskeletal: No chest  wall abnormality. No acute or significant osseous findings. Old rib fractures are noted with healing bilaterally. Review of the MIP images confirms the above findings. CTA ABDOMEN AND PELVIS FINDINGS VASCULAR Aorta: Abdominal aorta demonstrates atherosclerotic calcifications and mild infrarenal dilatation to 3.7 cm. This is stable from prior exam. No dissection is identified. No new aneurysmal dilatation is seen. Celiac: Calcific changes are noted at the origin although no focal hemodynamically significant stenosis is noted. SMA: Scattered calcified plaque is noted without focal stenosis. Renals: Single renal arteries are identified bilaterally with evidence of atherosclerotic plaque. The overall appearance is stable from the prior exam. IMA: Patent without evidence of aneurysm, dissection, vasculitis or significant stenosis. 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 Veins: No  specific venous abnormality is noted Review of the MIP images confirms the above findings. NON-VASCULAR Hepatobiliary: No focal liver abnormality is seen. Status post cholecystectomy. No biliary dilatation. Pancreas: Unremarkable. No pancreatic ductal dilatation or surrounding inflammatory changes. Spleen: Normal in size without focal abnormality. Adrenals/Urinary Tract: Adrenal glands are within normal limits bilaterally. Kidneys demonstrate a normal enhancement pattern bilaterally. Exophytic lesion is noted from the upper pole of the right kidney stable in appearance from the prior exam. No hydronephrosis is seen. The bladder is decompressed. Stomach/Bowel: The appendix has been surgically removed. No obstructive or inflammatory changes of the large or small bowel are seen. The stomach is within normal limits. Lymphatic: No sizable lymphadenopathy is noted. Reproductive: Prostate is unremarkable. Other: No abdominal wall hernia or abnormality. No abdominopelvic ascites. Musculoskeletal: Changes of prior fusion at L4-5 and L5-S1. No acute bony abnormality is seen. Review of the MIP images confirms the above findings. IMPRESSION: CTA of the chest: Atherosclerotic changes of the aorta without aneurysmal dilatation or dissection. No evidence of pulmonary emboli. Scattered bilateral pulmonary nodules stable in appearance from the prior exam in dating back to 2019. Given their stability they are felt to be benign in etiology. 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 Mild stable dilatation of the right common iliac artery. Stable exophytic lesion in the upper pole of the right kidney. No other focal abnormality is noted. Electronically Signed   By: Inez Catalina M.D.   On: 07/22/2019 20:19    Procedures Procedures (including critical care time)  Medications Ordered in ED aspirin chewable tablet 324 mg (324 mg Oral Given 07/22/19 1624)  HYDROmorphone (DILAUDID)  injection 0.5 mg (0.5 mg Intravenous Given 07/22/19 1639)  iohexol (OMNIPAQUE) 350 MG/ML injection 100 mL (100 mLs Intravenous Contrast Given 07/22/19 1956)  heparin bolus via infusion 4,000 Units (4,000 Units Intravenous Bolus from Bag 07/22/19 2235)    ED Course  I have reviewed the triage vital signs and the nursing notes.  Pertinent labs & imaging results that were available during my care of the patient were reviewed by me and considered in my medical decision making (see chart for details).    MDM Rules/Calculators/A&P                      Here for epigastric and lower central chest pain which comes after dialysis, symptoms also exertional.  Differential includes ACS, ruptured AAA, dissection, mesenteric ischemia.  First troponin positive, patient given aspirin, second downtrending, chest pain significantly improved.  CTA chest abdomen pelvis was ordered to rule out dissection, referred AAA, mesenteric ischemia after discussion with Dr. Hollie Salk with nephrology.  CTA did not show  any acute emergent changes.  Pain likely secondary to anginal equivalent in the setting of hypotension from dialysis.  Discussed with cardiology who recommends heparin, likely left heart cath in the morning.  These results were discussed with Dr. Shanon Brow who will admit to hospital medicine.   Final Clinical Impression(s) / ED Diagnoses Final diagnoses:  Chest pain, unspecified type  Epigastric pain  Elevated troponin    Rx / DC Orders ED Discharge Orders    None       Jaimi Belle, [place], MD 07/23/19 0030    Tegeler, Gwenyth Allegra, MD 07/23/19 [id number]

## 2019-07-22 NOTE — ED Notes (Signed)
Pt waiting for CT at this time

## 2019-07-22 NOTE — ED Notes (Signed)
Pt returned from CT °

## 2019-07-22 NOTE — Progress Notes (Signed)
ANTICOAGULATION CONSULT NOTE - Initial Consult  Pharmacy Consult for heparin Indication: chest pain/ACS  No Known Allergies  Patient Measurements: Height: 6\' 4"  (193 cm) Weight: 230 lb (104.3 kg) IBW/kg (Calculated) : 86.8 Heparin Dosing Weight: 104.3kg  Vital Signs: Temp: 97.6 F (36.4 C) (03/04 1325) Temp Source: Oral (03/04 1325) BP: 143/68 (03/04 2047) Pulse Rate: 65 (03/04 2047)  Labs: Recent Labs    07/22/19 1343 07/22/19 1548  HGB 13.4  --   HCT 40.8  --   PLT 202  --   CREATININE 5.32*  --   TROPONINIHS 229* 209*    Estimated Creatinine Clearance: 18.1 mL/min (A) (by C-G formula based on SCr of 5.32 mg/dL (H)).   Medical History: Past Medical History:  Diagnosis Date  . AAA (abdominal aortic aneurysm) ([place]) 06/2016   Mild increase in size of 3.4 cm infrarenal abdominal aortic aneurysm  . Anemia   . Aortic atherosclerosis ([place])   . Asthma   . Atrial flutter ([place])   . AVF (arteriovenous fistula) ([facility])    Left forearm  . CAD (coronary artery disease)    a. s/p prior stenting of RCA in 1999 b. cath in 2003 showing moderate CAD c. NST in 2016 showing small area of ischemic and low-risk  . CHF (congestive heart failure) ([place])   . CKD (chronic kidney disease), stage V ([place])    kidney transplant evaluation  . COPD (chronic obstructive pulmonary disease) ([place])    with ongoing tobacco use and patient failed sham takes  . Diverticulosis 2018   Mild sigmoid colon diverticulosis.  Marland Kitchen DM2 (diabetes mellitus, type 2) ([place])   . Dyslipidemia   . Dysrhythmia 2018   atrial fibrillation  . Edema    chronic lower extremity secondary to right heart failure and chronic venous insufficiency  . Fatigue   . Fatty liver 2010   Mild  . Headache   . HTN (hypertension)   . Morbid obesity ([place])   . Multiple pulmonary nodules 05/2018   Bilateral  . Pneumonia   . Presence of permanent cardiac pacemaker   . PVD (peripheral vascular disease) ([facility])    with toe amputations  secondary to Buerger's disease.   Marland Kitchen Reflux esophagitis    hx  . Sleep apnea    PT STATES ONE TEST WAS POSITIVE AND ANOTHER TEST WAS NEGATIVE  . Two-vessel coronary artery disease    moderate. by cath in 2003. Status post stenting of the mdi RCA November 1999 normal left ventricular ejection fraction  . Wears glasses   . Wears hearing aid    B/L    Medications:  Infusions:  . heparin      Assessment: 82 yom presented to the ED with abdominal pain and chest pain. Troponin elevated and now starting IV heparin. CT negative for dissection and he is not on anticoagulation PTA. CBC is WNL.   Goal of Therapy:  Heparin level 0.3-0.7 units/ml Monitor platelets by anticoagulation protocol: Yes   Plan:  Heparin bolus 4000 units IV x 1 Heparin gtt 1200 units/hr Check an 8 hr heparin level Daily heparin level and CBC  [name], [name] 07/22/2019,9:23 PM

## 2019-07-22 NOTE — ED Notes (Signed)
Patient transported to CT 

## 2019-07-22 NOTE — H&P (Signed)
History and Physical    [name] YIR:[id number] DOB: [birth year] DOA: 07/22/2019  PCP: Manon Hilding, MD  Patient coming from: Home  Chief Complaint: Pain  HPI: [name] is a 69 y.o. male with medical history significant of end-stage renal disease on dialysis Tuesday Thursday Saturday, abdominal aortic aneurysm, a flutter, coronary artery disease status post stent to RCA 1999, congestive heart failure, COPD, multiple pulmonary nodules comes in with over 6 months of diffuse abdominal pain and chest pain that occurs only right after dialysis.  He says it lasts several hours usually goes away with rest.  It only occurs on the day that he is getting dialysis.  The pain is from the lower abdomen all the way up into the lower chest.  He gets nauseous with it and sometimes vomits.  He denies any fevers.  Denies any diarrhea.  Denies any lower extremity swelling or edema.  Denies any cough.  Patient found to have positive troponins here and cardiology is consulted recommending heparin drip and cardiac cath in the morning.  Currently is pain-free.  Review of Systems: As per HPI otherwise 10 point review of systems negative.   Past Medical History:  Diagnosis Date  . AAA (abdominal aortic aneurysm) ([place]) 06/2016   Mild increase in size of 3.4 cm infrarenal abdominal aortic aneurysm  . Anemia   . Aortic atherosclerosis ([place])   . Asthma   . Atrial flutter ([place])   . AVF (arteriovenous fistula) (HCC)    Left forearm  . CAD (coronary artery disease)    a. s/p prior stenting of RCA in 1999 b. cath in 2003 showing moderate CAD c. NST in 2016 showing small area of ischemic and low-risk  . CHF (congestive heart failure) ([place])   . CKD (chronic kidney disease), stage V ([place])    kidney transplant evaluation  . COPD (chronic obstructive pulmonary disease) ([place])    with ongoing tobacco use and patient failed sham takes  . Diverticulosis 2018   Mild sigmoid colon diverticulosis.  Marland Kitchen DM2 (diabetes  mellitus, type 2) ([place])   . Dyslipidemia   . Dysrhythmia 2018   atrial fibrillation  . Edema    chronic lower extremity secondary to right heart failure and chronic venous insufficiency  . Fatigue   . Fatty liver 2010   Mild  . Headache   . HTN (hypertension)   . Morbid obesity ([place])   . Multiple pulmonary nodules 05/2018   Bilateral  . Pneumonia   . Presence of permanent cardiac pacemaker   . 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   Marland Kitchen Reflux esophagitis    hx  . Sleep apnea    PT STATES ONE TEST WAS POSITIVE AND ANOTHER TEST WAS NEGATIVE  . Two-vessel coronary artery disease    moderate. by cath in 2003. Status post stenting of the mdi RCA November 1999 normal left ventricular ejection fraction  . Wears glasses   . Wears hearing aid    B/L    Past Surgical History:  Procedure Laterality Date  . A/V FISTULAGRAM Left 04/30/2019   Procedure: A/V FISTULAGRAM;  Surgeon: Elam Dutch, MD;  Location: [facility];  Service: Cardiovascular;  Laterality: Left;  . ABDOMINAL SURGERY    . APPENDECTOMY    . AV FISTULA PLACEMENT Right 03/11/2018   Procedure: CREATION Brachiocephalic Fistula RIGHT ARM;  Surgeon: Rosetta Posner, MD;  Location: [facility];  Service:  Vascular;  Laterality: Right;  . AV FISTULA PLACEMENT Left 05/06/2019   Procedure: LEFT BRACHIOCEPHALIC ARTERIOVENOUS (AV) FISTULA CREATION;  Surgeon: Marty Heck, MD;  Location: [facility];  Service: Vascular;  Laterality: Left;  . BACK SURGERY     x3; cages in patient's back since 2000  . BIOPSY  11/12/2018   Procedure: BIOPSY;  Surgeon: Laurence Spates, MD;  Location: [facility];  Service: Endoscopy;;  . CARDIAC CATHETERIZATION     stent  . CHOLECYSTECTOMY    . CLOSED REDUCTION SHOULDER DISLOCATION    . COLONOSCOPY W/ BIOPSIES AND POLYPECTOMY    . COLONOSCOPY WITH PROPOFOL N/A 11/12/2018   Procedure: COLONOSCOPY WITH PROPOFOL;  Surgeon: Laurence Spates,  MD;  Location: [facility];  Service: Endoscopy;  Laterality: N/A;  . CORONARY ANGIOPLASTY    . diabetic ulcers    . DIAGNOSTIC LAPAROSCOPY    . ESOPHAGOGASTRODUODENOSCOPY (EGD) WITH PROPOFOL N/A 11/12/2018   Procedure: ESOPHAGOGASTRODUODENOSCOPY (EGD) WITH PROPOFOL;  Surgeon: Laurence Spates, MD;  [facility]: [facility];  Service: Endoscopy;  Laterality: N/A;  . GROIN EXPLORATION    . HEMOSTASIS CLIP PLACEMENT  11/12/2018   Procedure: HEMOSTASIS CLIP PLACEMENT;  Surgeon: Laurence Spates, MD;  [facility];  Service: Endoscopy;;  . INSERT / REPLACE / REMOVE PACEMAKER    . INSERTION OF ARTERIOVENOUS (AV) ARTEGRAFT ARM Left 03/18/2018   Procedure: INSERTION OF ARTERIOVENOUS (AV) FISTULA;  Surgeon: Rosetta Posner, MD;  Location: [facility];  Service: Vascular;  Laterality: Left;  . IR FLUORO GUIDE CV LINE RIGHT  04/11/2019  . IR US GUIDE VASC ACCESS RIGHT  04/11/2019  . LIGATION ARTERIOVENOUS GORTEX GRAFT Right 03/18/2018   Procedure: LIGATION ARTERIOVENOUS FISTULA;  Surgeon: Rosetta Posner, MD;  Location: [facility];  Service: Vascular;  Laterality: Right;  . OSTECTOMY Left 04/23/2017   Procedure: OSTECTOMY LEFT GREAT TOE;  Surgeon: [name], DPM;  Location: [facility];  Service: Podiatry;  Laterality: Left;  left great toe  . POLYPECTOMY  11/12/2018   Procedure: POLYPECTOMY;  Surgeon: Laurence Spates, MD;  Location: [facility];  Service: Endoscopy;;  . TUMOR REMOVAL     small intestine x3     reports that he has been smoking cigarettes. He started smoking about 51 years ago. He has a 40.00 pack-year smoking history. He has never used smokeless tobacco. He reports previous alcohol use. He reports that he does not use drugs.  No Known Allergies  Family History  Problem Relation Age of Onset  . Diabetes Mother   . Alcohol abuse Father     Prior to Admission medications   Medication Sig Start Date End Date Taking? Authorizing Provider  acetaminophen (TYLENOL) 325 MG tablet Take 975 mg  by mouth every morning.    Yes [provider]  albuterol (VENTOLIN HFA) 108 (90 BASE) MCG/ACT inhaler Inhale 2 puffs into the lungs every 6 (six) hours as needed for wheezing or shortness of breath.    Yes [provider]  atorvastatin (LIPITOR) 40 MG tablet Take 1 tablet (40 mg total) by mouth every evening. 08/24/18  Yes BranchAlphonse Guild, MD  beclomethasone (QVAR) 80 MCG/ACT inhaler Inhale 2 puffs into the lungs 2 (two) times daily as needed (shortness of breath).   Yes [provider]  bumetanide (BUMEX) 2 MG tablet Take 2 mg by mouth 2 (two) times daily. 07/15/19  Yes [provider]  buPROPion (WELLBUTRIN SR) 150 MG 12 hr tablet Take 150 mg by mouth 2 (two) times daily.  Yes [provider]  carvedilol (COREG) 25 MG tablet TAKE ONE TABLET BY MOUTH TWICE DAILY. Patient taking differently: Take 25 mg by mouth 2 (two) times daily with a meal.  02/15/19  Yes Branch, Alphonse Guild, MD  Cholecalciferol (VITAMIN D3) 2000 units TABS Take 2,000 Units by mouth every morning.    Yes [provider]  diltiazem (CARDIZEM) 30 MG tablet TAKE ONE TABLET BY MOUTH TWICE DAILY. Patient taking differently: Take 30 mg by mouth 2 (two) times daily.  02/15/19  Yes BranchAlphonse Guild, MD  iron polysaccharides (NIFEREX) 150 MG capsule Take 150 mg by mouth every morning.   Yes [provider]  linaclotide (LINZESS) 290 MCG CAPS capsule Take 290 mcg by mouth daily as needed (constipation).   Yes [provider]  Melatonin 10 MG TABS Take 10 mg by mouth at bedtime as needed (sleep).    Yes [provider]  Multiple Vitamin (MULTIVITAMIN WITH MINERALS) TABS tablet Take 1 tablet by mouth every morning.    Yes [provider]  omeprazole (PRILOSEC) 20 MG capsule Take 20 mg by mouth every morning.    Yes [provider]  polyethylene glycol (MIRALAX / GLYCOLAX) packet Take 17 g by mouth daily as needed for mild constipation. Mix in  morning coffee   Yes [provider]  potassium chloride SA (K-DUR,KLOR-CON) 20 MEQ tablet Take 1 tablet (20 mEq total) by mouth daily. Patient taking differently: Take 20 mEq by mouth every morning.  05/10/17  Yes Tat, Shanon Brow, MD  Sennosides 25 MG TABS Take 50 mg by mouth at bedtime.   Yes [provider]  sildenafil (REVATIO) 20 MG tablet Take 20 mg by mouth daily as needed (Erectile dysfunction).  07/09/19  Yes [provider]  sildenafil (VIAGRA) 100 MG tablet Take 1 tablet (100 mg total) by mouth as needed for erectile dysfunction. Patient taking differently: Take 100 mg by mouth daily as needed for erectile dysfunction.  01/23/18  Yes Allred, Jeneen Rinks, MD  silver sulfADIAZINE (SILVADENE) 1 % cream Apply 1 application topically daily as needed (For wound care with dressing).    Yes [provider]  doxycycline (VIBRA-TABS) 100 MG tablet Take 100 mg by mouth 2 (two) times daily. 07/19/19   [provider]  hydrALAZINE (APRESOLINE) 100 MG tablet TAKE ONE TABLET BY MOUTH THREE TIMES DAILY Patient not taking: Take two tablets (200 mg) by mouth every morning and one tablet (100 mg) at night 02/15/19   Arnoldo Lenis, MD  oxyCODONE-acetaminophen (PERCOCET) 5-325 MG tablet Take 1 tablet by mouth every 6 (six) hours as needed for severe pain. Patient not taking: Reported on 07/14/2019 05/06/19 05/05/20  Ulyses Amor, PA-C    Physical Exam: Vitals:   07/22/19 2047 07/22/19 2100 07/22/19 2115 07/22/19 2130  BP: (!) 143/68 131/71 (!) 140/123 130/69  Pulse: 65 64 66 64  Resp: 17 16 (!) 29 15  Temp:      TempSrc:      SpO2: 97% 94% 97% 96%  Weight:      Height:          Constitutional: NAD, calm, comfortable Vitals:   07/22/19 2047 07/22/19 2100 07/22/19 2115 07/22/19 2130  BP: (!) 143/68 131/71 (!) 140/123 130/69  Pulse: 65 64 66 64  Resp: 17 16 (!) 29 15  Temp:      TempSrc:      SpO2: 97% 94% 97% 96%  Weight:      Height:  Eyes:  PERRL, lids and conjunctivae normal ENMT: Mucous membranes are moist. Posterior pharynx clear of any exudate or lesions.Normal dentition.  Neck: normal, supple, no masses, no thyromegaly Respiratory: clear to auscultation bilaterally, no wheezing, no crackles. Normal respiratory effort. No accessory muscle use.  Cardiovascular: Regular rate and rhythm, no murmurs / rubs / gallops. No extremity edema. 2+ pedal pulses. No carotid bruits.  Abdomen: no tenderness, no masses palpated. No hepatosplenomegaly. Bowel sounds positive.  Musculoskeletal: no clubbing / cyanosis. No joint deformity upper and lower extremities. Good ROM, no contractures. Normal muscle tone.  Skin: no rashes, lesions, ulcers. No induration Neurologic: CN 2-12 grossly intact. Sensation intact, DTR normal. Strength 5/5 in all 4.  Psychiatric: Normal judgment and insight. Alert and oriented x 3. Normal mood.    Labs on Admission: I have personally reviewed following labs and imaging studies  CBC: Recent Labs  Lab 07/22/19 1343  WBC 9.7  HGB 13.4  HCT 40.8  MCV 104.6*  PLT 846   Basic Metabolic Panel: Recent Labs  Lab 07/22/19 1343  NA 133*  K 4.3  CL 91*  CO2 28  GLUCOSE 115*  BUN 20  CREATININE 5.32*  CALCIUM 9.0   GFR: Estimated Creatinine Clearance: 18.1 mL/min (A) (by C-G formula based on SCr of 5.32 mg/dL (H)). Liver Function Tests: Recent Labs  Lab 07/22/19 1343  AST 19  ALT 15  ALKPHOS 59  BILITOT 0.7  PROT 8.0  ALBUMIN 3.6   Recent Labs  Lab 07/22/19 1343  LIPASE 38   No results for input(s): AMMONIA in the last 168 hours. Coagulation Profile: No results for input(s): INR, PROTIME in the last 168 hours. Cardiac Enzymes: No results for input(s): CKTOTAL, CKMB, CKMBINDEX, TROPONINI in the last 168 hours. BNP (last 3 results) No results for input(s): PROBNP in the last 8760 hours. HbA1C: No results for input(s): HGBA1C in the last 72 hours. CBG: No results for input(s): GLUCAP in  the last 168 hours. Lipid Profile: No results for input(s): CHOL, HDL, LDLCALC, TRIG, CHOLHDL, LDLDIRECT in the last 72 hours. Thyroid Function Tests: No results for input(s): TSH, T4TOTAL, FREET4, T3FREE, THYROIDAB in the last 72 hours. Anemia Panel: No results for input(s): VITAMINB12, FOLATE, FERRITIN, TIBC, IRON, RETICCTPCT in the last 72 hours. Urine analysis:    Component Value Date/Time   COLORURINE YELLOW 07/22/2019 1625   APPEARANCEUR HAZY (A) 07/22/2019 1625   LABSPEC 1.010 07/22/2019 1625   PHURINE 6.0 07/22/2019 1625   GLUCOSEU NEGATIVE 07/22/2019 1625   HGBUR NEGATIVE 07/22/2019 1625   BILIRUBINUR NEGATIVE 07/22/2019 1625   KETONESUR NEGATIVE 07/22/2019 1625   PROTEINUR >=300 (A) 07/22/2019 1625   UROBILINOGEN 0.2 09/27/2014 0027   NITRITE NEGATIVE 07/22/2019 1625   LEUKOCYTESUR NEGATIVE 07/22/2019 1625   Sepsis Labs: !!!!!!!!!!!!!!!!!!!!!!!!!!!!!!!!!!!!!!!!!!!! @LABRCNTIP (procalcitonin:4,lacticidven:4) )No results found for this or any previous visit (from the past 240 hour(s)).   Radiological Exams on Admission: CT Angio Chest/Abd/Pel for Dissection W and/or Wo Contrast  Result Date: 07/22/2019 CLINICAL DATA:  Chest pain and abdominal pain for 1 day EXAM: CT ANGIOGRAPHY CHEST, ABDOMEN AND PELVIS TECHNIQUE: Multidetector CT imaging through the chest, abdomen and pelvis was performed using the standard protocol during bolus administration of intravenous contrast. Multiplanar reconstructed images and MIPs were obtained and reviewed to evaluate the vascular anatomy. CONTRAST:  172mL OMNIPAQUE IOHEXOL 350 MG/ML SOLN COMPARISON:  05/12/2019 FINDINGS: CTA CHEST FINDINGS Cardiovascular: Thoracic aorta demonstrates atherosclerotic calcifications without aneurysmal dilatation or dissection. No significant cardiac enlargement is seen. Heavy coronary  calcifications are noted. The pulmonary artery shows a normal branching pattern without intraluminal filling defect to suggest pulmonary  embolism. Right jugular dialysis catheter is noted in satisfactory position. Mediastinum/Nodes: Thoracic inlet is within normal limits. No hilar or mediastinal adenopathy is noted. The esophagus as visualized is within normal limits. Lungs/Pleura: Lungs are well aerated bilaterally. There is a subpleural nodule in the left upper lobe best seen on image number 63 of series 8. This measures approximately 12 mm and is stable from the prior exam. Stable smaller nodules are noted within the left lung is well. No new focal nodule is seen. Mild scarring is noted in the right base. Musculoskeletal: No chest wall abnormality. No acute or significant osseous findings. Old rib fractures are noted with healing bilaterally. Review of the MIP images confirms the above findings. CTA ABDOMEN AND PELVIS FINDINGS VASCULAR Aorta: Abdominal aorta demonstrates atherosclerotic calcifications and mild infrarenal dilatation to 3.7 cm. This is stable from prior exam. No dissection is identified. No new aneurysmal dilatation is seen. Celiac: Calcific changes are noted at the origin although no focal hemodynamically significant stenosis is noted. SMA: Scattered calcified plaque is noted without focal stenosis. Renals: Single renal arteries are identified bilaterally with evidence of atherosclerotic plaque. The overall appearance is stable from the prior exam. IMA: Patent without evidence of aneurysm, dissection, vasculitis or significant stenosis. 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 Veins: No specific venous abnormality is noted Review of the MIP images confirms the above findings. NON-VASCULAR Hepatobiliary: No focal liver abnormality is seen. Status post cholecystectomy. No biliary dilatation. Pancreas: Unremarkable. No pancreatic ductal dilatation or surrounding inflammatory changes. Spleen: Normal in size without focal abnormality. Adrenals/Urinary Tract: Adrenal glands are within  normal limits bilaterally. Kidneys demonstrate a normal enhancement pattern bilaterally. Exophytic lesion is noted from the upper pole of the right kidney stable in appearance from the prior exam. No hydronephrosis is seen. The bladder is decompressed. Stomach/Bowel: The appendix has been surgically removed. No obstructive or inflammatory changes of the large or small bowel are seen. The stomach is within normal limits. Lymphatic: No sizable lymphadenopathy is noted. Reproductive: Prostate is unremarkable. Other: No abdominal wall hernia or abnormality. No abdominopelvic ascites. Musculoskeletal: Changes of prior fusion at L4-5 and L5-S1. No acute bony abnormality is seen. Review of the MIP images confirms the above findings. IMPRESSION: CTA of the chest: Atherosclerotic changes of the aorta without aneurysmal dilatation or dissection. No evidence of pulmonary emboli. Scattered bilateral pulmonary nodules stable in appearance from the prior exam in dating back to 2019. Given their stability they are felt to be benign in etiology. 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 Mild stable dilatation of the right common iliac artery. Stable exophytic lesion in the upper pole of the right kidney. No other focal abnormality is noted. Electronically Signed   By: Inez Catalina M.D.   On: 07/22/2019 20:19   EKG reviewed no acute changes compared with previous same Old chart reviewed Case discussed with EDP  Assessment/Plan 69 year old male with multiple medical problems comes in with anginal type symptoms that occur only on the day of dialysis but is very nonspecific includes the abdomen area  Principal Problem:   NSTEMI (non-ST elevated myocardial infarction) (HCC)-troponin over 200.  Currently pain-free.  Proceed with heparin drip and aspirin.  Cardiology called will see patient and likely do cath in the morning.  Keep n.p.o. after midnight.  Check fasting lipid  panel.  CTA results reviewed has stable scattered bilateral pulmonary nodules his aneurysm is also stable and he has no evidence of pulmonary emboli.  Admit on telemetry floor.  Further recommendations depending on cardiology evaluation.  Active Problems:   Generalized anxiety disorder-stable at this time    COPD (chronic obstructive pulmonary disease) (HCC)-lungs clear stable at this time    Benign essential HTN-continue home meds    CAD (coronary artery disease)-as above    Atrial flutter (HCC)-currently rate controlled    Chronic diastolic CHF (congestive heart failure) (HCC)-currently compensated and stable  End-stage renal disease-dialysis dependent Tuesday Thursday Saturday last session today      DVT prophylaxis: Heparin drip Code Status: Full Family Communication: None Disposition Plan: 1 to 2 days Consults called: Cardiology Admission status: Admission   [employer] A MD [facility]  If 7PM-7AM, please contact night-coverage [email or web address] Password [facility]  07/22/2019, 9:42 PM

## 2019-07-22 NOTE — Consult Note (Signed)
Cardiology Consultation:   Patient ID: [name] MRN: [id number]; DOB: 11-28-1950  Admit date: 07/22/2019 Date of Consult: 07/22/2019  Primary Care Provider: Manon Hilding, MD Primary Cardiologist: Carlyle Dolly, MD  Primary Electrophysiologist: Dr. Rayann Heman    Patient Profile:   [name] is a 69 y.o. male with a hx of A. Fib, CAD s/p PCI, ESRD on iHD, HTN, HLD, and PVD who is being seen today for the evaluation of chest/abdominal pain.  History of Present Illness:   [name] has been on dialysis for the past few months and notes significant upper abdominal and lower chest pain that has been recurring with dialysis. He states that the pain starts near the end of his treatment and lasts for several hours afterwards. His pain is associated with significant nausea and worsens with exertion. He notes chronic shortness of breath that is not worsened by the pain. He is unable to eat when he has the pain. He has lost weight from fluid removal with dialysis but does not thing he has lost anything else. He denies any blood in his stool. He continues to make a small amount of urine. He states that he has been off of eliquis for some time now due to anemia. He does not take aspirin daily either. He is not very active. He has not had a cath since 2003. He notes very mild discomfort in his abdomen at this time but no chest pain. He denies any orthopnea, PND, lower leg swelling, or palpitations. He continues to smoke cigarettes.   Heart Pathway Score:     Past Medical History:  Diagnosis Date  . AAA (abdominal aortic aneurysm) ([place]) 06/2016   Mild increase in size of 3.4 cm infrarenal abdominal aortic aneurysm  . Anemia   . Aortic atherosclerosis ([place])   . Asthma   . Atrial flutter ([place])   . AVF (arteriovenous fistula) ([facility])    Left forearm  . CAD (coronary artery disease)    a. s/p prior stenting of RCA in 1999 b. cath in 2003 showing moderate CAD c. NST in 2016 showing small area of  ischemic and low-risk  . CHF (congestive heart failure) ([place])   . CKD (chronic kidney disease), stage V ([place])    kidney transplant evaluation  . COPD (chronic obstructive pulmonary disease) ([place])    with ongoing tobacco use and patient failed sham takes  . Diverticulosis 2018   Mild sigmoid colon diverticulosis.  Marland Kitchen DM2 (diabetes mellitus, type 2) ([place])   . Dyslipidemia   . Dysrhythmia 2018   atrial fibrillation  . Edema    chronic lower extremity secondary to right heart failure and chronic venous insufficiency  . Fatigue   . Fatty liver 2010   Mild  . Headache   . HTN (hypertension)   . Morbid obesity ([place])   . Multiple pulmonary nodules 05/2018   Bilateral  . Pneumonia   . Presence of permanent cardiac pacemaker   . 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   Marland Kitchen Reflux esophagitis    hx  . Sleep apnea    PT STATES ONE TEST WAS POSITIVE AND ANOTHER TEST WAS NEGATIVE  . Two-vessel coronary artery disease    moderate. by cath in 2003. Status post stenting of the mdi RCA November 1999 normal left ventricular ejection fraction  . Wears glasses   . Wears hearing aid    B/L    Past Surgical  History:  Procedure Laterality Date  . A/V FISTULAGRAM Left 04/30/2019   Procedure: A/V FISTULAGRAM;  Surgeon: Elam Dutch, MD;  Location: [facility];  Service: Cardiovascular;  Laterality: Left;  . ABDOMINAL SURGERY    . APPENDECTOMY    . AV FISTULA PLACEMENT Right 03/11/2018   Procedure: CREATION Brachiocephalic Fistula RIGHT ARM;  Surgeon: Rosetta Posner, MD;  Location: [facility] OR;  Service: Vascular;  Laterality: Right;  . AV FISTULA PLACEMENT Left 05/06/2019   Procedure: LEFT BRACHIOCEPHALIC ARTERIOVENOUS (AV) FISTULA CREATION;  Surgeon: Marty Heck, MD;  Location: [facility];  Service: Vascular;  Laterality: Left;  . BACK SURGERY     x3; cages in patient's back since 2000  . BIOPSY  11/12/2018   Procedure: BIOPSY;   Surgeon: Laurence Spates, MD;  Location: [facility];  Service: Endoscopy;;  . CARDIAC CATHETERIZATION     stent  . CHOLECYSTECTOMY    . CLOSED REDUCTION SHOULDER DISLOCATION    . COLONOSCOPY W/ BIOPSIES AND POLYPECTOMY    . COLONOSCOPY WITH PROPOFOL N/A 11/12/2018   Procedure: COLONOSCOPY WITH PROPOFOL;  Surgeon: Laurence Spates, MD;  Location: [facility];  Service: Endoscopy;  Laterality: N/A;  . CORONARY ANGIOPLASTY    . diabetic ulcers    . DIAGNOSTIC LAPAROSCOPY    . ESOPHAGOGASTRODUODENOSCOPY (EGD) WITH PROPOFOL N/A 11/12/2018   Procedure: ESOPHAGOGASTRODUODENOSCOPY (EGD) WITH PROPOFOL;  Surgeon: Laurence Spates, MD;  Location: [facility];  Service: Endoscopy;  Laterality: N/A;  . GROIN EXPLORATION    . HEMOSTASIS CLIP PLACEMENT  11/12/2018   Procedure: HEMOSTASIS CLIP PLACEMENT;  Surgeon: Laurence Spates, MD;  Location: [facility];  Service: Endoscopy;;  . INSERT / REPLACE / REMOVE PACEMAKER    . INSERTION OF ARTERIOVENOUS (AV) ARTEGRAFT ARM Left 03/18/2018   Procedure: INSERTION OF ARTERIOVENOUS (AV) FISTULA;  Surgeon: Rosetta Posner, MD;  Location: [facility];  Service: Vascular;  Laterality: Left;  . IR FLUORO GUIDE CV LINE RIGHT  04/11/2019  . IR US GUIDE VASC ACCESS RIGHT  04/11/2019  . LIGATION ARTERIOVENOUS GORTEX GRAFT Right 03/18/2018   Procedure: LIGATION ARTERIOVENOUS FISTULA;  Surgeon: Rosetta Posner, MD;  Location: [facility];  Service: Vascular;  Laterality: Right;  . OSTECTOMY Left 04/23/2017   Procedure: OSTECTOMY LEFT GREAT TOE;  Surgeon: [name], DPM;  Location: [facility];  Service: Podiatry;  Laterality: Left;  left great toe  . POLYPECTOMY  11/12/2018   Procedure: POLYPECTOMY;  Surgeon: Laurence Spates, MD;  Location: [facility];  Service: Endoscopy;;  . TUMOR REMOVAL     small intestine x3     Home Medications:  Prior to Admission medications   Medication Sig Start Date End Date Taking? Authorizing Provider  acetaminophen (TYLENOL) 325 MG tablet Take 975 mg by  mouth every morning.    Yes [provider]  albuterol (VENTOLIN HFA) 108 (90 BASE) MCG/ACT inhaler Inhale 2 puffs into the lungs every 6 (six) hours as needed for wheezing or shortness of breath.    Yes [provider]  atorvastatin (LIPITOR) 40 MG tablet Take 1 tablet (40 mg total) by mouth every evening. 08/24/18  Yes BranchAlphonse Guild, MD  beclomethasone (QVAR) 80 MCG/ACT inhaler Inhale 2 puffs into the lungs 2 (two) times daily as needed (shortness of breath).   Yes [provider]  bumetanide (BUMEX) 2 MG tablet Take 2 mg by mouth 2 (two) times daily. 07/15/19  Yes [provider]  buPROPion (WELLBUTRIN SR) 150 MG 12 hr tablet Take 150 mg  by mouth 2 (two) times daily.   Yes [provider]  carvedilol (COREG) 25 MG tablet TAKE ONE TABLET BY MOUTH TWICE DAILY. Patient taking differently: Take 25 mg by mouth 2 (two) times daily with a meal.  02/15/19  Yes Branch, Alphonse Guild, MD  Cholecalciferol (VITAMIN D3) 2000 units TABS Take 2,000 Units by mouth every morning.    Yes [provider]  diltiazem (CARDIZEM) 30 MG tablet TAKE ONE TABLET BY MOUTH TWICE DAILY. Patient taking differently: Take 30 mg by mouth 2 (two) times daily.  02/15/19  Yes BranchAlphonse Guild, MD  iron polysaccharides (NIFEREX) 150 MG capsule Take 150 mg by mouth every morning.   Yes [provider]  linaclotide (LINZESS) 290 MCG CAPS capsule Take 290 mcg by mouth daily as needed (constipation).   Yes [provider]  Melatonin 10 MG TABS Take 10 mg by mouth at bedtime as needed (sleep).    Yes [provider]  Multiple Vitamin (MULTIVITAMIN WITH MINERALS) TABS tablet Take 1 tablet by mouth every morning.    Yes [provider]  omeprazole (PRILOSEC) 20 MG capsule Take 20 mg by mouth every morning.    Yes [provider]  polyethylene glycol (MIRALAX / GLYCOLAX) packet Take 17 g by mouth daily as needed for mild constipation. Mix in  morning coffee   Yes [provider]  potassium chloride SA (K-DUR,KLOR-CON) 20 MEQ tablet Take 1 tablet (20 mEq total) by mouth daily. Patient taking differently: Take 20 mEq by mouth every morning.  05/10/17  Yes Tat, Shanon Brow, MD  Sennosides 25 MG TABS Take 50 mg by mouth at bedtime.   Yes [provider]  sildenafil (REVATIO) 20 MG tablet Take 20 mg by mouth daily as needed (Erectile dysfunction).  07/09/19  Yes [provider]  sildenafil (VIAGRA) 100 MG tablet Take 1 tablet (100 mg total) by mouth as needed for erectile dysfunction. Patient taking differently: Take 100 mg by mouth daily as needed for erectile dysfunction.  01/23/18  Yes Allred, Jeneen Rinks, MD  silver sulfADIAZINE (SILVADENE) 1 % cream Apply 1 application topically daily as needed (For wound care with dressing).    Yes [provider]  doxycycline (VIBRA-TABS) 100 MG tablet Take 100 mg by mouth 2 (two) times daily. 07/19/19   [provider]  hydrALAZINE (APRESOLINE) 100 MG tablet TAKE ONE TABLET BY MOUTH THREE TIMES DAILY Patient not taking: Take two tablets (200 mg) by mouth every morning and one tablet (100 mg) at night 02/15/19   Arnoldo Lenis, MD  oxyCODONE-acetaminophen (PERCOCET) 5-325 MG tablet Take 1 tablet by mouth every 6 (six) hours as needed for severe pain. Patient not taking: Reported on 07/14/2019 05/06/19 05/05/20  Ulyses Amor, PA-C    Inpatient Medications: Scheduled Meds: . heparin  4,000 Units Intravenous Once   Continuous Infusions: . heparin     PRN Meds:   Allergies:   No Known Allergies  Social History:   Social History   Socioeconomic History  . Marital status: Married    Spouse name: Not on file  . Number of children: 2  . Years of education: Not on file  . Highest education level: Not on file  Occupational History  . Occupation: retired Engineer, structural  Tobacco Use  . Smoking status: Current Every Day Smoker    Packs/day: 1.00    Years:  40.00    Pack years: 40.00    Types: Cigarettes    Start date: 05/20/1968  .  Smokeless tobacco: Never Used  Substance and Sexual Activity  . Alcohol use: Not Currently    Alcohol/week: 0.0 standard drinks    Comment: rare  . Drug use: No  . Sexual activity: Yes  Other Topics Concern  . Not on file  Social History Narrative   Patient continues to smoke.    Social Determinants of Health   Financial Resource Strain:   . Difficulty of Paying Living Expenses: Not on file  Food Insecurity:   . Worried About Charity fundraiser in the Last Year: Not on file  . Ran Out of Food in the Last Year: Not on file  Transportation Needs:   . Lack of Transportation (Medical): Not on file  . Lack of Transportation (Non-Medical): Not on file  Physical Activity:   . Days of Exercise per Week: Not on file  . Minutes of Exercise per Session: Not on file  Stress:   . Feeling of Stress : Not on file  Social Connections:   . Frequency of Communication with Friends and Family: Not on file  . Frequency of Social Gatherings with Friends and Family: Not on file  . Attends Religious Services: Not on file  . Active Member of Clubs or Organizations: Not on file  . Attends Archivist Meetings: Not on file  . Marital Status: Not on file  Intimate Partner Violence:   . Fear of Current or Ex-Partner: Not on file  . Emotionally Abused: Not on file  . Physically Abused: Not on file  . Sexually Abused: Not on file    Family History:    Family History  Problem Relation Age of Onset  . Diabetes Mother   . Alcohol abuse Father      ROS:  Please see the history of present illness.  All other ROS reviewed and negative.     Physical Exam/Data:   Vitals:   07/22/19 2047 07/22/19 2100 07/22/19 2115 07/22/19 2130  BP: (!) 143/68 131/71 (!) 140/123 130/69  Pulse: 65 64 66 64  Resp: 17 16 (!) 29 15  Temp:      TempSrc:      SpO2: 97% 94% 97% 96%  Weight:      Height:       No intake or  output data in the 24 hours ending 07/22/19 2136 Last 3 Weights 07/22/2019 07/14/2019 06/21/2019  Weight (lbs) 230 lb 230 lb 237 lb  Weight (kg) 104.327 kg 104.327 kg 107.502 kg     Body mass index is 28 kg/m.  General:  Chronically ill appearing. Sitting up comfortably in bed  HEENT: normal Neck: no JVD Cardiac:  normal S1, S2; RRR; no murmurs Lungs:  Breathing comfortably on room air. Scattered inspiratory and expiratory wheezes throughout  Abd: soft, nontender, no hepatomegaly  Ext: no edema Musculoskeletal:  No deformities, BUE and BLE strength normal and equal Skin: warm and dry  Neuro:  CNs 2-12 intact, no focal abnormalities noted Psych:  Normal affect   EKG:  The EKG was personally reviewed and demonstrates:  Sinus rhythm with nonspecific ST segment changes difficult to interpret due to significant baseline wander   Relevant CV Studies: Echo 12/15/18 1. The left ventricle has normal systolic function with an ejection  fraction of 60-65%. The cavity size was normal. There is severely  increased left ventricular wall thickness. Left ventricular diastolic  Doppler parameters are consistent with impaired  relaxation.  2. The right ventricle has normal systolic function. The cavity was  normal. There is mildly increased right ventricular wall thickness.  3. Left atrial size was moderately dilated.  4. The aortic valve is tricuspid. Mild thickening of the aortic valve.  Mild calcification of the aortic valve. No stenosis of the aortic valve.  Mild aortic annular calcification noted.  5. The mitral valve is abnormal. Mild thickening of the mitral valve  leaflet. Mild calcification of the mitral valve leaflet. There is mild  mitral annular calcification present. No evidence of mitral valve  stenosis.  6. The aorta is normal in size and structure.  7. The aortic root is normal in size and structure.  8. Pulmonary hypertension is indeterminate, inadequate TR jet.  9. The  inferior vena cava was dilated in size with >50% respiratory  variability.   Nuclear Stress 12/15/18  There was no ST segment deviation noted during stress.  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  This is a low risk study.  The left ventricular ejection fraction is mildly decreased by nuclear imaging (46%). By echo done same day LVEF is 60-65%. Likely processing error by nuclear study, 60-65% would be considered the true LVEF.  Laboratory Data:  High Sensitivity Troponin:   Recent Labs  Lab 07/22/19 1343 07/22/19 1548  TROPONINIHS 229* 209*     Chemistry Recent Labs  Lab 07/22/19 1343  NA 133*  K 4.3  CL 91*  CO2 28  GLUCOSE 115*  BUN 20  CREATININE 5.32*  CALCIUM 9.0  GFRNONAA 10*  GFRAA 12*  ANIONGAP 14    Recent Labs  Lab 07/22/19 1343  PROT 8.0  ALBUMIN 3.6  AST 19  ALT 15  ALKPHOS 59  BILITOT 0.7   Hematology Recent Labs  Lab 07/22/19 1343  WBC 9.7  RBC 3.90*  HGB 13.4  HCT 40.8  MCV 104.6*  MCH 34.4*  MCHC 32.8  RDW 17.0*  PLT 202   BNPNo results for input(s): BNP, PROBNP in the last 168 hours.  DDimer No results for input(s): DDIMER in the last 168 hours.   Radiology/Studies:  CT Angio Chest/Abd/Pel for Dissection W and/or Wo Contrast  Result Date: 07/22/2019 CLINICAL DATA:  Chest pain and abdominal pain for 1 day EXAM: CT ANGIOGRAPHY CHEST, ABDOMEN AND PELVIS TECHNIQUE: Multidetector CT imaging through the chest, abdomen and pelvis was performed using the standard protocol during bolus administration of intravenous contrast. Multiplanar reconstructed images and MIPs were obtained and reviewed to evaluate the vascular anatomy. CONTRAST:  163mL OMNIPAQUE IOHEXOL 350 MG/ML SOLN COMPARISON:  05/12/2019 FINDINGS: CTA CHEST FINDINGS Cardiovascular: Thoracic aorta demonstrates atherosclerotic calcifications without aneurysmal dilatation or dissection. No significant cardiac enlargement is seen. Heavy  coronary calcifications are noted. The pulmonary artery shows a normal branching pattern without intraluminal filling defect to suggest pulmonary embolism. Right jugular dialysis catheter is noted in satisfactory position. Mediastinum/Nodes: Thoracic inlet is within normal limits. No hilar or mediastinal adenopathy is noted. The esophagus as visualized is within normal limits. Lungs/Pleura: Lungs are well aerated bilaterally. There is a subpleural nodule in the left upper lobe best seen on image number 63 of series 8. This measures approximately 12 mm and is stable from the prior exam. Stable smaller nodules are noted within the left lung is well. No new focal nodule is seen. Mild scarring is noted in the right base. Musculoskeletal: No chest wall abnormality. No acute or significant osseous findings. Old rib fractures are noted with healing bilaterally. Review of the MIP images confirms the above findings. CTA  ABDOMEN AND PELVIS FINDINGS VASCULAR Aorta: Abdominal aorta demonstrates atherosclerotic calcifications and mild infrarenal dilatation to 3.7 cm. This is stable from prior exam. No dissection is identified. No new aneurysmal dilatation is seen. Celiac: Calcific changes are noted at the origin although no focal hemodynamically significant stenosis is noted. SMA: Scattered calcified plaque is noted without focal stenosis. Renals: Single renal arteries are identified bilaterally with evidence of atherosclerotic plaque. The overall appearance is stable from the prior exam. IMA: Patent without evidence of aneurysm, dissection, vasculitis or significant stenosis. 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 Veins: No specific venous abnormality is noted Review of the MIP images confirms the above findings. NON-VASCULAR Hepatobiliary: No focal liver abnormality is seen. Status post cholecystectomy. No biliary dilatation. Pancreas: Unremarkable. No pancreatic ductal  dilatation or surrounding inflammatory changes. Spleen: Normal in size without focal abnormality. Adrenals/Urinary Tract: Adrenal glands are within normal limits bilaterally. Kidneys demonstrate a normal enhancement pattern bilaterally. Exophytic lesion is noted from the upper pole of the right kidney stable in appearance from the prior exam. No hydronephrosis is seen. The bladder is decompressed. Stomach/Bowel: The appendix has been surgically removed. No obstructive or inflammatory changes of the large or small bowel are seen. The stomach is within normal limits. Lymphatic: No sizable lymphadenopathy is noted. Reproductive: Prostate is unremarkable. Other: No abdominal wall hernia or abnormality. No abdominopelvic ascites. Musculoskeletal: Changes of prior fusion at L4-5 and L5-S1. No acute bony abnormality is seen. Review of the MIP images confirms the above findings. IMPRESSION: CTA of the chest: Atherosclerotic changes of the aorta without aneurysmal dilatation or dissection. No evidence of pulmonary emboli. Scattered bilateral pulmonary nodules stable in appearance from the prior exam in dating back to 2019. Given their stability they are felt to be benign in etiology. 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 Mild stable dilatation of the right common iliac artery. Stable exophytic lesion in the upper pole of the right kidney. No other focal abnormality is noted. Electronically Signed   By: Inez Catalina M.D.   On: 07/22/2019 20:19   TIMI Risk Score for Unstable Angina or Non-ST Elevation MI:   The patient's TIMI risk score is 4, which indicates a 20% risk of all cause mortality, new or recurrent myocardial infarction or need for urgent revascularization in the next 14 days.   Assessment and Plan:   Mr. Quinter presents with abdominal/chest pain with an elevated troponin consistent with NSTEMI. His symptoms are concerning for demand ischemia in the  setting of reproducibility at the end of dialysis after fluid removal. He has numerous risk factors for CAD and known personal history of CAD. Given his elevated troponin and ongoing symptoms, recommend LHC for further evaluation.  - Admit to medicine - NPO at midnight for LHC in AM - Aspirin 324mg  followed by 81 mg daily - Continue lipitor 40mg  and home coreg 25mg  BID - Check lipid panel, TSH, HgA1c  - Heparin drip, ACS nomogram       For questions or updates, please contact [facility] Please consult [email or web address] for contact info under     Signed, Princella Pellegrini, MD  07/22/2019 9:36 PM

## 2019-07-22 NOTE — ED Triage Notes (Signed)
Pt arrives pov with reports of chest tightness and abd pain onset today. States the pain happens every time after dialysis but is getting worse. Reports abd pain radiates to his back.

## 2019-07-22 NOTE — ED Notes (Signed)
Patient returned from CT

## 2019-07-22 NOTE — ED Notes (Signed)
Attempted report, to call the nurse back in 10 minutes.

## 2019-07-23 ENCOUNTER — Inpatient Hospital Stay (HOSPITAL_COMMUNITY): Payer: Medicare Other

## 2019-07-23 ENCOUNTER — Encounter (HOSPITAL_COMMUNITY)
Admission: EM | Disposition: A | Discharge status: Home / Self Care | Payer: Self-pay | Source: Home / Self Care | Attending: Internal Medicine

## 2019-07-23 DIAGNOSIS — I2510 Atherosclerotic heart disease of native coronary artery without angina pectoris: Secondary | ICD-10-CM

## 2019-07-23 DIAGNOSIS — N186 End stage renal disease: Secondary | ICD-10-CM

## 2019-07-23 HISTORY — PX: LEFT HEART CATH AND CORONARY ANGIOGRAPHY: CATH118249

## 2019-07-23 LAB — BASIC METABOLIC PANEL
Anion gap: 13 (ref 5–15)
BUN: 31 mg/dL — ABNORMAL HIGH (ref 8–23)
CO2: 26 mmol/L (ref 22–32)
Calcium: 8.9 mg/dL (ref 8.9–10.3)
Chloride: 94 mmol/L — ABNORMAL LOW (ref 98–111)
Creatinine, Ser: 6.77 mg/dL — ABNORMAL HIGH (ref 0.61–1.24)
GFR calc Af Amer: 9 mL/min — ABNORMAL LOW (ref >60)
GFR calc non Af Amer: 8 mL/min — ABNORMAL LOW (ref >60)
Glucose, Bld: 83 mg/dL (ref 70–99)
Potassium: 5 mmol/L (ref 3.5–5.1)
Sodium: 133 mmol/L — ABNORMAL LOW (ref 135–145)

## 2019-07-23 LAB — LIPID PANEL
Cholesterol: 93 mg/dL (ref 0–200)
HDL: 38 mg/dL — ABNORMAL LOW (ref >40)
LDL Cholesterol: 36 mg/dL (ref 0–99)
Total CHOL/HDL Ratio: 2.4 RATIO
Triglycerides: 95 mg/dL (ref <150)
VLDL: 19 mg/dL (ref 0–40)

## 2019-07-23 LAB — CBC
HCT: 39.1 % (ref 39.0–52.0)
Hemoglobin: 12.8 g/dL — ABNORMAL LOW (ref 13.0–17.0)
MCH: 33.5 pg (ref 26.0–34.0)
MCHC: 32.7 g/dL (ref 30.0–36.0)
MCV: 102.4 fL — ABNORMAL HIGH (ref 80.0–100.0)
Platelets: 168 10*3/uL (ref 150–400)
RBC: 3.82 MIL/uL — ABNORMAL LOW (ref 4.22–5.81)
RDW: 16.8 % — ABNORMAL HIGH (ref 11.5–15.5)
WBC: 8.4 10*3/uL (ref 4.0–10.5)
nRBC: 0.2 % (ref 0.0–0.2)

## 2019-07-23 LAB — GLUCOSE, CAPILLARY: Glucose-Capillary: 65 mg/dL — ABNORMAL LOW (ref 70–99)

## 2019-07-23 LAB — HEPARIN LEVEL (UNFRACTIONATED): Heparin Unfractionated: 0.27 IU/mL — ABNORMAL LOW (ref 0.30–0.70)

## 2019-07-23 LAB — SARS CORONAVIRUS 2 (TAT 6-24 HRS): SARS Coronavirus 2: NEGATIVE

## 2019-07-23 SURGERY — LEFT HEART CATH AND CORONARY ANGIOGRAPHY
Anesthesia: LOCAL

## 2019-07-23 MED ORDER — MIDAZOLAM HCL 2 MG/2ML IJ SOLN
INTRAMUSCULAR | Status: AC
  Filled 2019-07-23: qty 2

## 2019-07-23 MED ORDER — HEPARIN (PORCINE) IN NACL 1000-0.9 UT/500ML-% IV SOLN
INTRAVENOUS | Status: DC | PRN
  Administered 2019-07-23: 500 mL

## 2019-07-23 MED ORDER — LIDOCAINE HCL (PF) 1 % IJ SOLN
INTRAMUSCULAR | Status: DC | PRN
  Administered 2019-07-23: 20 mL

## 2019-07-23 MED ORDER — FENTANYL CITRATE (PF) 100 MCG/2ML IJ SOLN
INTRAMUSCULAR | Status: DC | PRN
  Administered 2019-07-23 (×2): 25 ug via INTRAVENOUS

## 2019-07-23 MED ORDER — IOHEXOL 350 MG/ML SOLN
100.0000 mL | Freq: Once | INTRAVENOUS | Status: AC | PRN
  Administered 2019-07-23: 100 mL via INTRAVENOUS

## 2019-07-23 MED ORDER — ISOSORBIDE MONONITRATE ER 30 MG PO TB24
30.0000 mg | ORAL_TABLET | Freq: Every day | ORAL | Status: DC
  Administered 2019-07-23 – 2019-07-24 (×2): 30 mg via ORAL
  Filled 2019-07-23 (×2): qty 1

## 2019-07-23 MED ORDER — LIDOCAINE HCL (PF) 1 % IJ SOLN
INTRAMUSCULAR | Status: AC
  Filled 2019-07-23: qty 30

## 2019-07-23 MED ORDER — FENTANYL CITRATE (PF) 100 MCG/2ML IJ SOLN
INTRAMUSCULAR | Status: AC
  Filled 2019-07-23: qty 2

## 2019-07-23 MED ORDER — HEPARIN (PORCINE) IN NACL 1000-0.9 UT/500ML-% IV SOLN
INTRAVENOUS | Status: AC
  Filled 2019-07-23: qty 500

## 2019-07-23 MED ORDER — ASPIRIN 81 MG PO CHEW: 81.0000 mg | CHEWABLE_TABLET | ORAL | Status: DC

## 2019-07-23 MED ORDER — SODIUM CHLORIDE 0.9 % IV SOLN: 250.0000 mL | INTRAVENOUS | Status: DC | PRN

## 2019-07-23 MED ORDER — SODIUM CHLORIDE 0.9% FLUSH: 3.0000 mL | Freq: Two times a day (BID) | INTRAVENOUS | Status: DC

## 2019-07-23 MED ORDER — HYDRALAZINE HCL 20 MG/ML IJ SOLN: 10.0000 mg | INTRAMUSCULAR | Status: AC | PRN

## 2019-07-23 MED ORDER — CHLORHEXIDINE GLUCONATE CLOTH 2 % EX PADS
6.0000 | MEDICATED_PAD | Freq: Every day | CUTANEOUS | Status: DC
  Administered 2019-07-23 – 2019-07-25 (×2): 6 via TOPICAL

## 2019-07-23 MED ORDER — IOHEXOL 350 MG/ML SOLN
INTRAVENOUS | Status: DC | PRN
  Administered 2019-07-23: 95 mL via INTRA_ARTERIAL

## 2019-07-23 MED ORDER — SODIUM CHLORIDE 0.9% FLUSH: 3.0000 mL | INTRAVENOUS | Status: DC | PRN

## 2019-07-23 MED ORDER — MIDAZOLAM HCL 2 MG/2ML IJ SOLN
INTRAMUSCULAR | Status: DC | PRN
  Administered 2019-07-23 (×2): 1 mg via INTRAVENOUS

## 2019-07-23 MED ORDER — TRAZODONE HCL 50 MG PO TABS
50.0000 mg | ORAL_TABLET | Freq: Once | ORAL | Status: AC
  Administered 2019-07-23: 50 mg via ORAL
  Filled 2019-07-23: qty 1

## 2019-07-23 MED ORDER — HEPARIN SODIUM (PORCINE) 5000 UNIT/ML IJ SOLN
5000.0000 [IU] | Freq: Three times a day (TID) | INTRAMUSCULAR | Status: DC
  Administered 2019-07-23 – 2019-07-25 (×4): 5000 [IU] via SUBCUTANEOUS
  Filled 2019-07-23 (×4): qty 1

## 2019-07-23 MED ORDER — SODIUM CHLORIDE 0.9 % IV SOLN: INTRAVENOUS | Status: DC

## 2019-07-23 MED ORDER — SODIUM CHLORIDE 0.9% FLUSH
3.0000 mL | Freq: Two times a day (BID) | INTRAVENOUS | Status: DC
  Administered 2019-07-23 – 2019-07-25 (×3): 3 mL via INTRAVENOUS

## 2019-07-23 SURGICAL SUPPLY — 9 items
CATH INFINITI 5FR MULTPACK ANG (CATHETERS) ×1 IMPLANT
KIT HEART LEFT (KITS) ×2 IMPLANT
PACK CARDIAC CATHETERIZATION (CUSTOM PROCEDURE TRAY) ×2 IMPLANT
SHEATH PINNACLE 5F 10CM (SHEATH) ×1 IMPLANT
SHEATH PROBE COVER 6X72 (BAG) ×1 IMPLANT
SYR MEDRAD MARK 7 150ML (SYRINGE) ×2 IMPLANT
TRANSDUCER W/STOPCOCK (MISCELLANEOUS) ×2 IMPLANT
TUBING CIL FLEX 10 FLL-RA (TUBING) ×2 IMPLANT
WIRE EMERALD 3MM-J .035X150CM (WIRE) ×1 IMPLANT

## 2019-07-23 NOTE — Progress Notes (Signed)
Site area: right groin  Site Prior to Removal:  Level 0  Pressure Applied For 20 MINUTES    Minutes Beginning at 1435  Manual:   Yes.    Patient Status During Pull:  Stable  Post Pull Groin Site:  Level 0  Post Pull Instructions Given:  Yes.    Post Pull Pulses Present:  Yes.    Dressing Applied:  Yes.    Comments:  Bed rest started at 1500 X 4 hr.

## 2019-07-23 NOTE — Progress Notes (Addendum)
Progress Note  Patient Name: [name] Date of Encounter: 07/23/2019  Primary Cardiologist: Carlyle Dolly, MD   Subjective   Pt describes lower abdominal pain during HD that radiates to his epigastric region, which sounds atypical for angina. Upon further questioning, he also reports chest tightness when climbing stairs, relieved with rest.  Low risk myoview 2020  Last heart cath 2003 without favorable PCI targets  He continues to smoke.   Inpatient Medications    Scheduled Meds: . aspirin  324 mg Oral NOW   Or  . aspirin  300 mg Rectal NOW  . aspirin EC  81 mg Oral Daily  . atorvastatin  40 mg Oral QPM  . bumetanide  2 mg Oral BID  . carvedilol  25 mg Oral BID  . diltiazem  30 mg Oral BID  . pantoprazole  40 mg Oral Daily  . sodium chloride flush  3 mL Intravenous Q12H   Continuous Infusions: . sodium chloride    . heparin 1,200 Units/hr (07/22/19 2231)   PRN Meds: sodium chloride, acetaminophen, albuterol, Melatonin, nitroGLYCERIN, ondansetron (ZOFRAN) IV, polyethylene glycol, sodium chloride flush   Vital Signs    Vitals:   07/22/19 2338 07/23/19 0500 07/23/19 0518 07/23/19 0600  BP: 119/67   (!) 142/50  Pulse: (!) 59 (!) 59  (!) 58  Resp: 20 16  18   Temp: 97.6 F (36.4 C)  97.9 F (36.6 C)   TempSrc: Oral  Oral   SpO2: 98% 95% 96% (!) 86%  Weight: 102.3 kg  102.8 kg   Height:       No intake or output data in the 24 hours ending 07/23/19 0655 Last 3 Weights 07/23/2019 07/22/2019 07/22/2019  Weight (lbs) 226 lb 9.6 oz 225 lb 9.6 oz 230 lb  Weight (kg) 102.785 kg 102.331 kg 104.327 kg      Telemetry    Paced rhythm - Personally Reviewed  ECG    No new tracings - Personally Reviewed  Physical Exam   GEN: No acute distress.   Neck: No JVD Cardiac: RRR, exam difficult with active wheezing Respiratory: respirations unlabored, wheezing throughout GI: Soft, nontender, non-distended  MS: No edema; No deformity. Missing right second toe,  necrotic looking tip of third toe Neuro:  Nonfocal  Psych: Normal affect   Labs    High Sensitivity Troponin:   Recent Labs  Lab 07/22/19 1343 07/22/19 1548  TROPONINIHS 229* 209*      Chemistry Recent Labs  Lab 07/22/19 1343 07/23/19 0557  NA 133* 133*  K 4.3 5.0  CL 91* 94*  CO2 28 26  GLUCOSE 115* 83  BUN 20 31*  CREATININE 5.32* 6.77*  CALCIUM 9.0 8.9  PROT 8.0  --   ALBUMIN 3.6  --   AST 19  --   ALT 15  --   ALKPHOS 59  --   BILITOT 0.7  --   GFRNONAA 10* 8*  GFRAA 12* 9*  ANIONGAP 14 13     Hematology Recent Labs  Lab 07/22/19 1343 07/23/19 0557  WBC 9.7 8.4  RBC 3.90* 3.82*  HGB 13.4 12.8*  HCT 40.8 39.1  MCV 104.6* 102.4*  MCH 34.4* 33.5  MCHC 32.8 32.7  RDW 17.0* 16.8*  PLT 202 168    BNPNo results for input(s): BNP, PROBNP in the last 168 hours.   DDimer No results for input(s): DDIMER in the last 168 hours.   Radiology    CT Angio Chest/Abd/Pel for Dissection W  and/or Wo Contrast  Result Date: 07/22/2019 CLINICAL DATA:  Chest pain and abdominal pain for 1 day EXAM: CT ANGIOGRAPHY CHEST, ABDOMEN AND PELVIS TECHNIQUE: Multidetector CT imaging through the chest, abdomen and pelvis was performed using the standard protocol during bolus administration of intravenous contrast. Multiplanar reconstructed images and MIPs were obtained and reviewed to evaluate the vascular anatomy. CONTRAST:  119mL OMNIPAQUE IOHEXOL 350 MG/ML SOLN COMPARISON:  05/12/2019 FINDINGS: CTA CHEST FINDINGS Cardiovascular: Thoracic aorta demonstrates atherosclerotic calcifications without aneurysmal dilatation or dissection. No significant cardiac enlargement is seen. Heavy coronary calcifications are noted. The pulmonary artery shows a normal branching pattern without intraluminal filling defect to suggest pulmonary embolism. Right jugular dialysis catheter is noted in satisfactory position. Mediastinum/Nodes: Thoracic inlet is within normal limits. No hilar or mediastinal  adenopathy is noted. The esophagus as visualized is within normal limits. Lungs/Pleura: Lungs are well aerated bilaterally. There is a subpleural nodule in the left upper lobe best seen on image number 63 of series 8. This measures approximately 12 mm and is stable from the prior exam. Stable smaller nodules are noted within the left lung is well. No new focal nodule is seen. Mild scarring is noted in the right base. Musculoskeletal: No chest wall abnormality. No acute or significant osseous findings. Old rib fractures are noted with healing bilaterally. Review of the MIP images confirms the above findings. CTA ABDOMEN AND PELVIS FINDINGS VASCULAR Aorta: Abdominal aorta demonstrates atherosclerotic calcifications and mild infrarenal dilatation to 3.7 cm. This is stable from prior exam. No dissection is identified. No new aneurysmal dilatation is seen. Celiac: Calcific changes are noted at the origin although no focal hemodynamically significant stenosis is noted. SMA: Scattered calcified plaque is noted without focal stenosis. Renals: Single renal arteries are identified bilaterally with evidence of atherosclerotic plaque. The overall appearance is stable from the prior exam. IMA: Patent without evidence of aneurysm, dissection, vasculitis or significant stenosis. 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 Veins: No specific venous abnormality is noted Review of the MIP images confirms the above findings. NON-VASCULAR Hepatobiliary: No focal liver abnormality is seen. Status post cholecystectomy. No biliary dilatation. Pancreas: Unremarkable. No pancreatic ductal dilatation or surrounding inflammatory changes. Spleen: Normal in size without focal abnormality. Adrenals/Urinary Tract: Adrenal glands are within normal limits bilaterally. Kidneys demonstrate a normal enhancement pattern bilaterally. Exophytic lesion is noted from the upper pole of the right kidney  stable in appearance from the prior exam. No hydronephrosis is seen. The bladder is decompressed. Stomach/Bowel: The appendix has been surgically removed. No obstructive or inflammatory changes of the large or small bowel are seen. The stomach is within normal limits. Lymphatic: No sizable lymphadenopathy is noted. Reproductive: Prostate is unremarkable. Other: No abdominal wall hernia or abnormality. No abdominopelvic ascites. Musculoskeletal: Changes of prior fusion at L4-5 and L5-S1. No acute bony abnormality is seen. Review of the MIP images confirms the above findings. IMPRESSION: CTA of the chest: Atherosclerotic changes of the aorta without aneurysmal dilatation or dissection. No evidence of pulmonary emboli. Scattered bilateral pulmonary nodules stable in appearance from the prior exam in dating back to 2019. Given their stability they are felt to be benign in etiology. 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 Mild stable dilatation of the right common iliac artery. Stable exophytic lesion in the upper pole of the right kidney. No other focal abnormality is noted. Electronically Signed   By: Inez Catalina  M.D.   On: 07/22/2019 20:19    Cardiac Studies   Nuclear stress test 12/15/18  There was no ST segment deviation noted during stress.  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  This is a low risk study.  The left ventricular ejection fraction is mildly decreased by nuclear imaging (46%). By echo done same day LVEF is 60-65%. Likely processing error by nuclear study, 60-65% would be considered the true LVEF.   Patient Profile     69 y.o. male with a hx of A. Fib, CAD s/p PCI, ESRD on iHD, HTN, HLD, and PVD who is being seen today for the evaluation of chest/abdominal pain.  Assessment & Plan    1. Chest pain 2. CAD s/p RCA stent (1999), last heart cath 2003 with moderate lesions - noted to  be not favorable for PCI, treated medically - reports chest pain during HD in his abdomen and epigastric region that lasts for several hours after HD - also reports chest pressure when climbing stairs, relieved with rest - hs troponin 229 --> 209 - EKG with right axis deviation and repolarization abnormality - known disease, low risk NM 11/2018, poor targets, will ask Dr. Burt Knack to review last cath films prior to proceeding with angiography today   3. ESRD on HD - has been compliant - TThSat - has right HD catheter,  Left arm fistula, PPM in place   4. Hypertension - hydralazine 100 mg TID   5. Hyperlipidemia - 07/23/2019: Cholesterol 93; HDL 38; LDL Cholesterol 36; Triglycerides 95; VLDL 19 - on lipitor 40 mg   6. 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 PAF/flutter - Afib burden was low at last device check in 10/2018 - eliquis was D/C'ed with anemia and CKD at clinic visit 10/2018 (also with GI bleeding) - BB, CCB      For questions or updates, please contact [facility] Please consult [email or web address] for contact info under        Signed, Ledora Bottcher, PA  07/23/2019, 6:55 AM    Patient seen, examined. Available data reviewed. Agree with findings, assessment, and plan as outlined by Doreene Adas, PA-C.  The patient is independently interviewed and examined.  He is alert, oriented, in no distress.  HEENT is normal, neck with normal JVP, lungs are clear bilaterally, heart is regular rate and rhythm without murmur gallop, abdomen is soft and nontender, extremities show no edema.  The patient presents with non-STEMI, mild troponin elevation with a high-sensitivity troponin peak of 229.  He has had progressive symptoms of chest and abdominal discomfort during dialysis.  I have recommended proceeding with cardiac catheterization because of his worsening pain that may be representative of cardiac ischemia.  The patient had a heart  catheterization in 2003 and was noted to have moderate diffuse LAD and circumflex stenosis and continued patency of an RCA stent that was placed in 1999.  He had relatively small coronary arteries.  Considering the 18-year time interval from his last heart catheterization, I think a coronary angiogram to reassess his coronary anatomy is indicated. I have reviewed the risks, indications, and alternatives to cardiac catheterization, possible angioplasty, and stenting with the patient. Risks include but are not limited to bleeding, infection, vascular injury, stroke, myocardial infection, arrhythmia, kidney injury, radiation-related injury in the case of prolonged fluoroscopy use, emergency cardiac surgery, and death. The patient understands the risks of serious complication is 1-2 in 3810 with diagnostic  cardiac cath and 1-2% or less with angioplasty/stenting.  Would recommend femoral access since he has end-stage renal disease.  He has had some problems with GI bleeding and I reviewed his upper endoscopy and colonoscopy reports from last year.  His upper endoscopy was essentially clear.  His colonoscopy showed a few polyps that were snared at the time of the procedure.  He also had a single AVM that was treated.  His hemoglobin is stable at 12.8 mg/dL.  I think if he requires PCI, he should be a candidate for dual antiplatelet therapy at least for short-term treatment.  We discussed all of this today and he understands the risks of recurrent bleeding if he requires aspirin and clopidogrel.  I would not treat him with a potent antiplatelet drug like ticagrelor.  Sherren Mocha, M.D. 07/23/2019 9:03 AM

## 2019-07-23 NOTE — Progress Notes (Signed)
[place] for heparin Indication: chest pain/ACS  No Known Allergies  Patient Measurements: Height: 6\' 4"  (193 cm) Weight: 226 lb 9.6 oz (102.8 kg) IBW/kg (Calculated) : 86.8 Heparin Dosing Weight: 104.3kg  Vital Signs: Temp: 97.9 F (36.6 C) (03/05 0518) Temp Source: Oral (03/05 0518) BP: 142/50 (03/05 0600) Pulse Rate: 58 (03/05 0600)  Labs: Recent Labs    07/22/19 1343 07/22/19 1548 07/23/19 0557  HGB 13.4  --  12.8*  HCT 40.8  --  39.1  PLT 202  --  168  HEPARINUNFRC  --   --  0.27*  CREATININE 5.32*  --  6.77*  TROPONINIHS 229* 209*  --     Estimated Creatinine Clearance: 13.2 mL/min (A) (by C-G formula based on SCr of 6.77 mg/dL (H)).   Medical Hist Assessment: 89 yom presented to the ED with abdominal pain and chest pain. Troponin elevated and now starting IV heparin. CT negative for dissection and he is not on anticoagulation PTA. CBC is WNL.   Heparin level slightly subtherapeutic (0.27) on gtt at 1200 units/hr. No issues with line or bleeding reported per RN.  Goal of Therapy:  Heparin level 0.3-0.7 units/ml Monitor platelets by anticoagulation protocol: Yes   Plan:  Increase heparin gtt to 1400 units/hr Check an 8 hr heparin level  Sherlon Handing, PharmD, BCPS Please see amion for complete clinical pharmacist phone list 07/23/2019,7:06 AM

## 2019-07-23 NOTE — Consult Note (Signed)
   [facility] CM Inpatient Consult   07/23/2019  RAYMUNDO ROUT [birth year] [id number]   THN status: showing pending  Patient screened for also high risk score for unplanned readmission and to check if potential [facility] [facility] services are needed.    Review of patient's medical record reveals patient is  HPI: [name] is a 68 y.o. male with medical history significant of end-stage renal disease on dialysis Tuesday Thursday Saturday, abdominal aortic aneurysm, a flutter, coronary artery disease status post stent to RCA 1999, congestive heart failure, COPD, multiple pulmonary nodules comes in with over 6 months of diffuse abdominal pain and chest pain that occurs only right after dialysis.  He says it lasts several hours usually goes away with rest.  It only occurs on the day that he is getting dialysis.  The pain is from the lower abdomen all the way up into the lower chest.  He gets nauseous with it and sometimes vomits.  He denies any fevers.  Denies any diarrhea.  Denies any lower extremity swelling or edema.  Denies any cough.  Patient found to have positive troponins.  Further medical review reveals and notes that patient noted to have had a COVID vaccine on 07/10/19 encounter.  Primary Care Provider is Consuello Masse, MD at [facility], this office provides the follow up. Patient lives in [place], [place] noted.  Pharm[id number] in [place], [place]  Plan:  Continue to follow up with inpatient [facility] team for progress and needs.  Please place a [facility] Care Management consult as appropriate and for questions contact:   Natividad Brood, RN BSN [facility] Liaison  [phone] business mobile phone Toll free office [phone]  Fax number: [phone] [email or web address]

## 2019-07-23 NOTE — Consult Note (Addendum)
Reason for Consult: ESRD Referring Physician: Dr. Burt Knack  Chief Complaint:  Abdominal pain  Assessment/Plan: 1. ESRD - plan on HD tomorrow to resume TTS regimen 2. HTN - on carvedilol, imdur, cardizem 3. Renal osteodystrophy - will check a phos for binder management 4. Anemia - @ goal 5. CASHD - s/p cath, decision to be made for medical management vs PCI.  6. COPD - Active smoker but states he has cut down. 7. Abd pain - wonder if he could have mesenteric ischemia; will request CTA abd to rule out given this has been a persistent problem and what causes the pt to shorten his hd treatments.   HPI: [name] is an 69 y.o. male HTN CASHD ESRD starting RRT 03/2019 now at [place] with last full treatment yesterday. He is here for lower abdominal pain + epigastric but also has chest tightness when climbing stairs relieved with rest. Left heart cath revealed severe 2 vessel obstructive disease, 75% proximal LCx and 90% mid LCx but heavily calcified with recommendation for aggressive medical therapy. He states that his swelling in the lower extremities is much better since starting dialysis; he receives heparin on dialysis.    05/06/19 Lt [facility] Dr. Carlis Abbott  ROS Pertinent items are noted in HPI.  Chemistry and CBC: Creat  Date/Time Value Ref Range Status  07/28/2015 04:06 PM 2.01 (H) 0.70 - 1.25 mg/dL Final   Creatinine, Ser  Date/Time Value Ref Range Status  07/23/2019 05:57 AM 6.77 (H) 0.61 - 1.24 mg/dL Final  07/22/2019 01:43 PM 5.32 (H) 0.61 - 1.24 mg/dL Final  05/06/2019 08:09 AM 4.50 (H) 0.61 - 1.24 mg/dL Final  04/30/2019 08:08 AM 4.80 (H) 0.61 - 1.24 mg/dL Final  04/12/2019 06:14 AM 5.02 (H) 0.61 - 1.24 mg/dL Final  04/11/2019 08:28 PM 4.58 (H) 0.61 - 1.24 mg/dL Final  04/11/2019 01:07 PM 5.74 (H) 0.61 - 1.24 mg/dL Final  04/10/2019 08:34 PM 6.06 (H) 0.61 - 1.24 mg/dL Final  04/10/2019 12:44 PM 6.33 (H) 0.61 - 1.24 mg/dL Final  04/09/2019 10:03 AM 6.30 (H) 0.61 -  1.24 mg/dL Final  02/23/2019 11:13 AM 5.31 (H) 0.61 - 1.24 mg/dL Final  01/19/2019 12:13 PM 5.68 (H) 0.61 - 1.24 mg/dL Final  01/05/2019 11:51 AM 4.50 (H) 0.61 - 1.24 mg/dL Final  12/22/2018 10:43 AM 6.02 (H) 0.61 - 1.24 mg/dL Final  11/24/2018 11:15 AM 5.60 (H) 0.61 - 1.24 mg/dL Final  10/20/2018 11:53 AM 4.49 (H) 0.61 - 1.24 mg/dL Final  10/14/2018 02:56 PM 4.53 (H) 0.61 - 1.24 mg/dL Final  09/08/2018 11:26 AM 4.80 (H) 0.61 - 1.24 mg/dL Final  03/19/2018 12:13 AM 3.73 (H) 0.61 - 1.24 mg/dL Final  03/18/2018 05:45 PM 3.85 (H) 0.61 - 1.24 mg/dL Final  08/06/2017 02:13 PM 3.23 (H) 0.76 - 1.27 mg/dL Final  05/10/2017 06:02 AM 3.12 (H) 0.61 - 1.24 mg/dL Final  05/09/2017 12:27 PM 3.24 (H) 0.61 - 1.24 mg/dL Final  04/14/2017 01:59 PM 2.60 (H) 0.61 - 1.24 mg/dL Final  04/08/2016 06:14 AM 2.11 (H) 0.61 - 1.24 mg/dL Final  04/07/2016 06:18 AM 2.17 (H) 0.61 - 1.24 mg/dL Final  04/06/2016 06:37 AM 2.19 (H) 0.61 - 1.24 mg/dL Final  04/05/2016 06:24 AM 2.33 (H) 0.61 - 1.24 mg/dL Final  04/04/2016 12:53 PM 2.59 (H) 0.61 - 1.24 mg/dL Final  08/07/2015 09:35 AM 1.81 (H) 0.76 - 1.27 mg/dL Final  09/28/2014 04:15 AM 1.86 (H) 0.61 - 1.24 mg/dL Final  09/27/2014 05:58 AM 2.02 (H) 0.61 -  1.24 mg/dL Final  09/26/2014 09:43 PM 2.26 (H) 0.61 - 1.24 mg/dL Final   Recent Labs  Lab 07/22/19 1343 07/23/19 0557  NA 133* 133*  K 4.3 5.0  CL 91* 94*  CO2 28 26  GLUCOSE 115* 83  BUN 20 31*  CREATININE 5.32* 6.77*  CALCIUM 9.0 8.9   Recent Labs  Lab 07/22/19 1343 07/23/19 0557  WBC 9.7 8.4  HGB 13.4 12.8*  HCT 40.8 39.1  MCV 104.6* 102.4*  PLT 202 168   Liver Function Tests: Recent Labs  Lab 07/22/19 1343  AST 19  ALT 15  ALKPHOS 59  BILITOT 0.7  PROT 8.0  ALBUMIN 3.6   Recent Labs  Lab 07/22/19 1343  LIPASE 38   No results for input(s): AMMONIA in the last 168 hours. Cardiac Enzymes: No results for input(s): CKTOTAL, CKMB, CKMBINDEX, TROPONINI in the last 168 hours. Iron Studies:  No results for input(s): IRON, TIBC, TRANSFERRIN, FERRITIN in the last 72 hours. PT/INR: @LABRCNTIP (inr:5)  Xrays/Other Studies: ) Results for orders placed or performed during the hospital encounter of 07/22/19 (from the past 48 hour(s))  Lipase, blood     Status: None   Collection Time: 07/22/19  1:43 PM  Result Value Ref Range   Lipase 38 11 - 51 U/L    Comment: Performed at [facility], [place] [address] [place], [place] [address]  Comprehensive metabolic panel     Status: Abnormal   Collection Time: 07/22/19  1:43 PM  Result Value Ref Range   Sodium 133 (L) 135 - 145 mmol/L   Potassium 4.3 3.5 - 5.1 mmol/L   Chloride 91 (L) 98 - 111 mmol/L   CO2 28 22 - 32 mmol/L   Glucose, Bld 115 (H) 70 - 99 mg/dL    Comment: Glucose reference range applies only to samples taken after fasting for at least 8 hours.   BUN 20 8 - 23 mg/dL   Creatinine, Ser 5.32 (H) 0.61 - 1.24 mg/dL   Calcium 9.0 8.9 - 10.3 mg/dL   Total Protein 8.0 6.5 - 8.1 g/dL   Albumin 3.6 3.5 - 5.0 g/dL   AST 19 15 - 41 U/L   ALT 15 0 - 44 U/L   Alkaline Phosphatase 59 38 - 126 U/L   Total Bilirubin 0.7 0.3 - 1.2 mg/dL   GFR calc non Af Amer 10 (L) >60 mL/min   GFR calc Af Amer 12 (L) >60 mL/min   Anion gap 14 5 - 15    Comment: Performed at [facility] [address] [place], [place] [address]  CBC     Status: Abnormal   Collection Time: 07/22/19  1:43 PM  Result Value Ref Range   WBC 9.7 4.0 - 10.5 K/uL   RBC 3.90 (L) 4.22 - 5.81 MIL/uL   Hemoglobin 13.4 13.0 - 17.0 g/dL   HCT 40.8 39.0 - 52.0 %   MCV 104.6 (H) 80.0 - 100.0 fL   MCH 34.4 (H) 26.0 - 34.0 pg   MCHC 32.8 30.0 - 36.0 g/dL   RDW 17.0 (H) 11.5 - 15.5 %   Platelets 202 150 - 400 K/uL   nRBC 0.3 (H) 0.0 - 0.2 %    Comment: Performed at [facility] [address] [place], [place] [address]  Troponin I (High Sensitivity)     Status: Abnormal   Collection Time: 07/22/19  1:43 PM  Result Value Ref Range   Troponin I (High  Sensitivity) 229 (HH) <18 ng/L    Comment: CRITICAL RESULT CALLED TO, READ BACK BY AND VERIFIED WITH: C BAIN RN [id number] [phone] BY A BENNETT (NOTE) Elevated high sensitivity troponin I (hsTnI) values and significant  changes across serial measurements may suggest ACS but many other  chronic and acute conditions are known to elevate hsTnI results.  Refer to the Links section for chest pain algorithms and additional  guidance. Performed at [facility], [place] [address] [place], [place] [address]   Troponin I (High Sensitivity)     Status: Abnormal   Collection Time: 07/22/19  3:48 PM  Result Value Ref Range   Troponin I (High Sensitivity) 209 (HH) <18 ng/L    Comment: CRITICAL VALUE NOTED.  VALUE IS CONSISTENT WITH PREVIOUSLY REPORTED AND CALLED VALUE. (NOTE) Elevated high sensitivity troponin I (hsTnI) values and significant  changes across serial measurements may suggest ACS but many other  chronic and acute conditions are known to elevate hsTnI results.  Refer to the Links section for chest pain algorithms and additional  guidance. Performed at [facility], [place] [address], [place], [place] [address]   Urinalysis, Routine w reflex microscopic     Status: Abnormal   Collection Time: 07/22/19  4:25 PM  Result Value Ref Range   Color, Urine YELLOW YELLOW   APPearance HAZY (A) CLEAR   Specific Gravity, Urine 1.010 1.005 - 1.030   pH 6.0 5.0 - 8.0   Glucose, UA NEGATIVE NEGATIVE mg/dL   Hgb urine dipstick NEGATIVE NEGATIVE   Bilirubin Urine NEGATIVE NEGATIVE   Ketones, ur NEGATIVE NEGATIVE mg/dL   Protein, ur >=300 (A) NEGATIVE mg/dL   Nitrite NEGATIVE NEGATIVE   Leukocytes,Ua NEGATIVE NEGATIVE   RBC / HPF 0-5 0 - 5 RBC/hpf   WBC, UA 0-5 0 - 5 WBC/hpf   Bacteria, UA RARE (A) NONE SEEN   Mucus PRESENT     Comment: Performed at [facility] [address] [place], [place] [address]  Lactic acid, plasma     Status: None   Collection Time: 07/22/19  4:25 PM   Result Value Ref Range   Lactic Acid, Venous 1.6 0.5 - 1.9 mmol/L    Comment: Performed at [facility] [address] [place], [place] [address]  SARS CORONAVIRUS 2 (TAT 6-24 HRS) Nasopharyngeal Nasopharyngeal Swab     Status: None   Collection Time: 07/22/19  8:53 PM   Specimen: Nasopharyngeal Swab  Result Value Ref Range   SARS Coronavirus 2 NEGATIVE NEGATIVE    Comment: (NOTE) SARS-CoV-2 target nucleic acids are NOT DETECTED. The SARS-CoV-2 RNA is generally detectable in upper and lower respiratory specimens during the acute phase of infection. Negative results do not preclude SARS-CoV-2 infection, do not rule out co-infections with other pathogens, and should not be used as the sole basis for treatment or other patient management decisions. Negative results must be combined with clinical observations, patient history, and epidemiological information. The expected result is Negative. Fact Sheet for Patients: SugarRoll.be Fact Sheet for Healthcare Providers: https://www.woods-mathews.com/ This test is not yet approved or cleared by the [place] FDA and  has been authorized for detection and/or diagnosis of SARS-CoV-2 by FDA under an Emergency Use Authorization (EUA). This EUA will remain  in effect (meaning this test can be used) for the duration of the COVID-19 declaration under Section 56 4(b)(1) of the Act, 21 U.S.C. section 360bbb-3(b)(1), unless the authorization is terminated or revoked sooner. Performed at  [facility], [place] [address] [place], [place] [address]   HIV Antibody (routine testing w rflx)     Status: None   Collection Time: 07/22/19  9:37 PM  Result Value Ref Range   HIV Screen 4th Generation wRfx NON REACTIVE NON REACTIVE    Comment: Performed at [facility], [place] [address], [place], [place] [address]  Heparin level (unfractionated)     Status: Abnormal   Collection Time: 07/23/19  5:57  AM  Result Value Ref Range   Heparin Unfractionated 0.27 (L) 0.30 - 0.70 IU/mL    Comment: (NOTE) If heparin results are below expected values, and patient dosage has  been confirmed, suggest follow up testing of antithrombin III levels. Performed at [facility], [place] [address], [place], [place] [address]   CBC     Status: Abnormal   Collection Time: 07/23/19  5:57 AM  Result Value Ref Range   WBC 8.4 4.0 - 10.5 K/uL   RBC 3.82 (L) 4.22 - 5.81 MIL/uL   Hemoglobin 12.8 (L) 13.0 - 17.0 g/dL   HCT 39.1 39.0 - 52.0 %   MCV 102.4 (H) 80.0 - 100.0 fL   MCH 33.5 26.0 - 34.0 pg   MCHC 32.7 30.0 - 36.0 g/dL   RDW 16.8 (H) 11.5 - 15.5 %   Platelets 168 150 - 400 K/uL   nRBC 0.2 0.0 - 0.2 %    Comment: Performed at [facility], [place] [address], [place], [place] [address]  Basic metabolic panel     Status: Abnormal   Collection Time: 07/23/19  5:57 AM  Result Value Ref Range   Sodium 133 (L) 135 - 145 mmol/L   Potassium 5.0 3.5 - 5.1 mmol/L   Chloride 94 (L) 98 - 111 mmol/L   CO2 26 22 - 32 mmol/L   Glucose, Bld 83 70 - 99 mg/dL    Comment: Glucose reference range applies only to samples taken after fasting for at least 8 hours.   BUN 31 (H) 8 - 23 mg/dL   Creatinine, Ser 6.77 (H) 0.61 - 1.24 mg/dL   Calcium 8.9 8.9 - 10.3 mg/dL   GFR calc non Af Amer 8 (L) >60 mL/min   GFR calc Af Amer 9 (L) >60 mL/min   Anion gap 13 5 - 15    Comment: Performed at [facility] [address] [place], [place] [address]  Lipid panel     Status: Abnormal   Collection Time: 07/23/19  5:57 AM  Result Value Ref Range   Cholesterol 93 0 - 200 mg/dL   Triglycerides 95 <150 mg/dL   HDL 38 (L) >40 mg/dL   Total CHOL/HDL Ratio 2.4 RATIO   VLDL 19 0 - 40 mg/dL   LDL Cholesterol 36 0 - 99 mg/dL    Comment:        Total Cholesterol/HDL:CHD Risk Coronary Heart Disease Risk Table                     Men   Women  1/2 Average Risk   3.4   3.3  Average Risk       5.0   4.4  2 X Average  Risk   9.6   7.1  3 X Average Risk  23.4   11.0        Use the calculated Patient Ratio above and the CHD Risk Table to determine the patient's CHD Risk.  ATP III CLASSIFICATION (LDL):  <100     mg/dL   Optimal  100-129  mg/dL   Near or Above                    Optimal  130-159  mg/dL   Borderline  160-189  mg/dL   High  >190     mg/dL   Very High Performed at [facility] [address], [place], [place] [address]   Glucose, capillary     Status: Abnormal   Collection Time: 07/23/19  2:23 PM  Result Value Ref Range   Glucose-Capillary 65 (L) 70 - 99 mg/dL    Comment: Glucose reference range applies only to samples taken after fasting for at least 8 hours.   CARDIAC CATHETERIZATION  Result Date: 07/23/2019  Mid LM to Dist LM lesion is 25% stenosed.  Mid LAD lesion is 40% stenosed.  1st Diag-1 lesion is 80% stenosed.  1st Diag-2 lesion is 90% stenosed.  1st Diag-3 lesion is 90% stenosed.  Ost Cx to Prox Cx lesion is 75% stenosed.  Mid Cx lesion is 90% stenosed.  Prox RCA lesion is 35% stenosed.  Dist RCA lesion is 50% stenosed.  The left ventricular systolic function is normal.  LV end diastolic pressure is normal.  The left ventricular ejection fraction is 55-65% by visual estimate.  1. Severe 2 vessel obstructive CAD. The RCA and LAD disease is stable compared to 2003. There is progressive disease in a large diagonal branch and the LCx    - the diagonal is a large bifurcating vessel.  It has multiple severe stenoses in the main branch with severely calcified lesions    - 75% proximal LCx and 90% mid LCx. There is an acutely angulated take off from the left main and the vessel is severely calcified. 2. Normal LV function 3. Normal LVEDP Plan: discussed with Dr Burt Knack. The diagonal is poorly suited for PCI due to diffuse and heavily calcified disease. The LCx is complex and would require atherectomy. The acute angulation would make it technically difficult. For now  would recommend aggressive medical therapy.   CT Angio Chest/Abd/Pel for Dissection W and/or Wo Contrast  Result Date: 07/22/2019 CLINICAL DATA:  Chest pain and abdominal pain for 1 day EXAM: CT ANGIOGRAPHY CHEST, ABDOMEN AND PELVIS TECHNIQUE: Multidetector CT imaging through the chest, abdomen and pelvis was performed using the standard protocol during bolus administration of intravenous contrast. Multiplanar reconstructed images and MIPs were obtained and reviewed to evaluate the vascular anatomy. CONTRAST:  130mL OMNIPAQUE IOHEXOL 350 MG/ML SOLN COMPARISON:  05/12/2019 FINDINGS: CTA CHEST FINDINGS Cardiovascular: Thoracic aorta demonstrates atherosclerotic calcifications without aneurysmal dilatation or dissection. No significant cardiac enlargement is seen. Heavy coronary calcifications are noted. The pulmonary artery shows a normal branching pattern without intraluminal filling defect to suggest pulmonary embolism. Right jugular dialysis catheter is noted in satisfactory position. Mediastinum/Nodes: Thoracic inlet is within normal limits. No hilar or mediastinal adenopathy is noted. The esophagus as visualized is within normal limits. Lungs/Pleura: Lungs are well aerated bilaterally. There is a subpleural nodule in the left upper lobe best seen on image number 63 of series 8. This measures approximately 12 mm and is stable from the prior exam. Stable smaller nodules are noted within the left lung is well. No new focal nodule is seen. Mild scarring is noted in the right base. Musculoskeletal: No chest wall abnormality. No acute or significant osseous findings. Old rib fractures are noted with healing  bilaterally. Review of the MIP images confirms the above findings. CTA ABDOMEN AND PELVIS FINDINGS VASCULAR Aorta: Abdominal aorta demonstrates atherosclerotic calcifications and mild infrarenal dilatation to 3.7 cm. This is stable from prior exam. No dissection is identified. No new aneurysmal dilatation is  seen. Celiac: Calcific changes are noted at the origin although no focal hemodynamically significant stenosis is noted. SMA: Scattered calcified plaque is noted without focal stenosis. Renals: Single renal arteries are identified bilaterally with evidence of atherosclerotic plaque. The overall appearance is stable from the prior exam. IMA: Patent without evidence of aneurysm, dissection, vasculitis or significant stenosis. 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 Veins: No specific venous abnormality is noted Review of the MIP images confirms the above findings. NON-VASCULAR Hepatobiliary: No focal liver abnormality is seen. Status post cholecystectomy. No biliary dilatation. Pancreas: Unremarkable. No pancreatic ductal dilatation or surrounding inflammatory changes. Spleen: Normal in size without focal abnormality. Adrenals/Urinary Tract: Adrenal glands are within normal limits bilaterally. Kidneys demonstrate a normal enhancement pattern bilaterally. Exophytic lesion is noted from the upper pole of the right kidney stable in appearance from the prior exam. No hydronephrosis is seen. The bladder is decompressed. Stomach/Bowel: The appendix has been surgically removed. No obstructive or inflammatory changes of the large or small bowel are seen. The stomach is within normal limits. Lymphatic: No sizable lymphadenopathy is noted. Reproductive: Prostate is unremarkable. Other: No abdominal wall hernia or abnormality. No abdominopelvic ascites. Musculoskeletal: Changes of prior fusion at L4-5 and L5-S1. No acute bony abnormality is seen. Review of the MIP images confirms the above findings. IMPRESSION: CTA of the chest: Atherosclerotic changes of the aorta without aneurysmal dilatation or dissection. No evidence of pulmonary emboli. Scattered bilateral pulmonary nodules stable in appearance from the prior exam in dating back to 2019. Given their stability they are  felt to be benign in etiology. 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 Mild stable dilatation of the right common iliac artery. Stable exophytic lesion in the upper pole of the right kidney. No other focal abnormality is noted. Electronically Signed   By: Inez Catalina M.D.   On: 07/22/2019 20:19    PMH:   Past Medical History:  Diagnosis Date  . AAA (abdominal aortic aneurysm) ([facility]) 06/2016   Mild increase in size of 3.4 cm infrarenal abdominal aortic aneurysm  . Anemia   . Aortic atherosclerosis ([facility])   . Asthma   . Atrial flutter ([facility])   . AVF (arteriovenous fistula) ([facility])    Left forearm  . CAD (coronary artery disease)    a. s/p prior stenting of RCA in 1999 b. cath in 2003 showing moderate CAD c. NST in 2016 showing small area of ischemic and low-risk  . CHF (congestive heart failure) ([place])   . CKD (chronic kidney disease), stage V ([facility])    kidney transplant evaluation  . COPD (chronic obstructive pulmonary disease) ([facility])    with ongoing tobacco use and patient failed sham takes  . Diverticulosis 2018   Mild sigmoid colon diverticulosis.  Marland Kitchen DM2 (diabetes mellitus, type 2) ([facility])   . Dyslipidemia   . Dysrhythmia 2018   atrial fibrillation  . Edema    chronic lower extremity secondary to right heart failure and chronic venous insufficiency  . Fatigue   . Fatty liver 2010   Mild  . Headache   . HTN (hypertension)   . Morbid obesity ([facility])   .  Multiple pulmonary nodules 05/2018   Bilateral  . Pneumonia   . Presence of permanent cardiac pacemaker   . 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   Marland Kitchen Reflux esophagitis    hx  . Sleep apnea    PT STATES ONE TEST WAS POSITIVE AND ANOTHER TEST WAS NEGATIVE  . Two-vessel coronary artery disease    moderate. by cath in 2003. Status post stenting of the mdi RCA November 1999 normal left ventricular ejection fraction  . Wears  glasses   . Wears hearing aid    B/L    PSH:   Past Surgical History:  Procedure Laterality Date  . A/V FISTULAGRAM Left 04/30/2019   Procedure: A/V FISTULAGRAM;  Surgeon: Elam Dutch, MD;  Location: [facility];  Service: Cardiovascular;  Laterality: Left;  . ABDOMINAL SURGERY    . APPENDECTOMY    . AV FISTULA PLACEMENT Right 03/11/2018   Procedure: CREATION Brachiocephalic Fistula RIGHT ARM;  Surgeon: Rosetta Posner, MD;  Location: [facility];  Service: Vascular;  Laterality: Right;  . AV FISTULA PLACEMENT Left 05/06/2019   Procedure: LEFT BRACHIOCEPHALIC ARTERIOVENOUS (AV) FISTULA CREATION;  Surgeon: Marty Heck, MD;  Location: [facility];  Service: Vascular;  Laterality: Left;  . BACK SURGERY     x3; cages in patient's back since 2000  . BIOPSY  11/12/2018   Procedure: BIOPSY;  Surgeon: Laurence Spates, MD;  Location: [facility];  Service: Endoscopy;;  . CARDIAC CATHETERIZATION     stent  . CHOLECYSTECTOMY    . CLOSED REDUCTION SHOULDER DISLOCATION    . COLONOSCOPY W/ BIOPSIES AND POLYPECTOMY    . COLONOSCOPY WITH PROPOFOL N/A 11/12/2018   Procedure: COLONOSCOPY WITH PROPOFOL;  Surgeon: Laurence Spates, MD;  Location: [facility];  Service: Endoscopy;  Laterality: N/A;  . CORONARY ANGIOPLASTY    . diabetic ulcers    . DIAGNOSTIC LAPAROSCOPY    . ESOPHAGOGASTRODUODENOSCOPY (EGD) WITH PROPOFOL N/A 11/12/2018   Procedure: ESOPHAGOGASTRODUODENOSCOPY (EGD) WITH PROPOFOL;  Surgeon: Laurence Spates, MD;  Location: [facility];  Service: Endoscopy;  Laterality: N/A;  . GROIN EXPLORATION    . HEMOSTASIS CLIP PLACEMENT  11/12/2018   Procedure: HEMOSTASIS CLIP PLACEMENT;  Surgeon: Laurence Spates, MD;  Location: [facility];  Service: Endoscopy;;  . INSERT / REPLACE / REMOVE PACEMAKER    . INSERTION OF ARTERIOVENOUS (AV) ARTEGRAFT ARM Left 03/18/2018   Procedure: INSERTION OF ARTERIOVENOUS (AV) FISTULA;  Surgeon: Rosetta Posner, MD;  Location: [facility];  Service: Vascular;   Laterality: Left;  . IR FLUORO GUIDE CV LINE RIGHT  04/11/2019  . IR US GUIDE VASC ACCESS RIGHT  04/11/2019  . LIGATION ARTERIOVENOUS GORTEX GRAFT Right 03/18/2018   Procedure: LIGATION ARTERIOVENOUS FISTULA;  Surgeon: Rosetta Posner, MD;  Location: [facility];  Service: Vascular;  Laterality: Right;  . OSTECTOMY Left 04/23/2017   Procedure: OSTECTOMY LEFT GREAT TOE;  Surgeon: [name], DPM;  Location: [facility];  Service: Podiatry;  Laterality: Left;  left great toe  . POLYPECTOMY  11/12/2018   Procedure: POLYPECTOMY;  Surgeon: Laurence Spates, MD;  Location: [facility];  Service: Endoscopy;;  . TUMOR REMOVAL     small intestine x3    Allergies: No Known Allergies  Medications:   Prior to Admission medications   Medication Sig Start Date End Date Taking? Authorizing Provider  acetaminophen (TYLENOL) 325 MG tablet Take 975 mg by mouth every morning.    Yes [provider]  albuterol (VENTOLIN HFA) 108 (90 BASE) MCG/ACT inhaler Inhale 2 puffs into the lungs every 6 (six) hours as needed for wheezing or shortness of breath.    Yes [provider]  atorvastatin (LIPITOR) 40 MG tablet Take 1 tablet (40 mg total) by mouth every evening. 08/24/18  Yes BranchAlphonse Guild, MD  beclomethasone (QVAR) 80 MCG/ACT inhaler Inhale 2 puffs into the lungs 2 (two) times daily as needed (shortness of breath).   Yes [provider]  bumetanide (BUMEX) 2 MG tablet Take 2 mg by mouth 2 (two) times daily. 07/15/19  Yes [provider]  buPROPion (WELLBUTRIN SR) 150 MG 12 hr tablet Take 150 mg by mouth 2 (two) times daily.   Yes [provider]  carvedilol (COREG) 25 MG tablet TAKE ONE TABLET BY MOUTH TWICE DAILY. Patient taking differently: Take 25 mg by mouth 2 (two) times daily with a meal.  02/15/19  Yes Branch, Alphonse Guild, MD  Cholecalciferol (VITAMIN D3) 2000 units TABS Take 2,000 Units by mouth every morning.    Yes [provider]  diltiazem (CARDIZEM) 30  MG tablet TAKE ONE TABLET BY MOUTH TWICE DAILY. Patient taking differently: Take 30 mg by mouth 2 (two) times daily.  02/15/19  Yes BranchAlphonse Guild, MD  iron polysaccharides (NIFEREX) 150 MG capsule Take 150 mg by mouth every morning.   Yes [provider]  linaclotide (LINZESS) 290 MCG CAPS capsule Take 290 mcg by mouth daily as needed (constipation).   Yes [provider]  Melatonin 10 MG TABS Take 10 mg by mouth at bedtime as needed (sleep).    Yes [provider]  Multiple Vitamin (MULTIVITAMIN WITH MINERALS) TABS tablet Take 1 tablet by mouth every morning.    Yes [provider]  omeprazole (PRILOSEC) 20 MG capsule Take 20 mg by mouth every morning.    Yes [provider]  polyethylene glycol (MIRALAX / GLYCOLAX) packet Take 17 g by mouth daily as needed for mild constipation. Mix in morning coffee   Yes [provider]  potassium chloride SA (K-DUR,KLOR-CON) 20 MEQ tablet Take 1 tablet (20 mEq total) by mouth daily. Patient taking differently: Take 20 mEq by mouth every morning.  05/10/17  Yes Tat, Shanon Brow, MD  Sennosides 25 MG TABS Take 50 mg by mouth at bedtime.   Yes [provider]  sildenafil (REVATIO) 20 MG tablet Take 20 mg by mouth daily as needed (Erectile dysfunction).  07/09/19  Yes [provider]  sildenafil (VIAGRA) 100 MG tablet Take 1 tablet (100 mg total) by mouth as needed for erectile dysfunction. Patient taking differently: Take 100 mg by mouth daily as needed for erectile dysfunction.  01/23/18  Yes Allred, Jeneen Rinks, MD  silver sulfADIAZINE (SILVADENE) 1 % cream Apply 1 application topically daily as needed (For wound care with dressing).    Yes [provider]  doxycycline (VIBRA-TABS) 100 MG tablet Take 100 mg by mouth 2 (two) times daily. 07/19/19   [provider]  hydrALAZINE (APRESOLINE) 100 MG tablet TAKE ONE TABLET BY MOUTH THREE TIMES DAILY Patient not taking: Take two tablets (200  mg) by mouth every morning and one tablet (100 mg) at night 02/15/19   Arnoldo Lenis, MD  oxyCODONE-acetaminophen (PERCOCET) 5-325 MG tablet Take 1 tablet by mouth every 6 (six) hours as needed for severe pain. Patient not taking: Reported on 07/14/2019 05/06/19 05/05/20  Ulyses Amor, PA-C    Discontinued Meds:   Medications Discontinued During This  Encounter  Medication Reason  . cloNIDine (CATAPRES - DOSED IN MG/24 HR) 0.1 mg/24hr patch Patient Preference  . epoetin alfa (EPOGEN) 4000 UNIT/ML injection Discontinued by provider  . torsemide (DEMADEX) 100 MG tablet Patient Preference  . rosuvastatin (CRESTOR) 20 MG tablet Patient Preference  . promethazine (PHENERGAN) 25 MG tablet Patient Preference  . multivitamin (VIT W/EXTRA C) CHEW chewable tablet Patient Preference  . aspirin chewable tablet 324 mg   . aspirin suppository 300 mg   . iohexol (OMNIPAQUE) 350 MG/ML injection Patient Discharge  . Heparin (Porcine) in NaCl 1000-0.9 UT/500ML-% SOLN Patient Discharge  . Heparin (Porcine) in NaCl 1000-0.9 UT/500ML-% SOLN Patient Discharge  . lidocaine (PF) (XYLOCAINE) 1 % injection Patient Discharge  . midazolam (VERSED) injection Patient Discharge  . fentaNYL (SUBLIMAZE) injection Patient Discharge  . heparin ADULT infusion 100 units/mL (25000 units/260mL sodium chloride 0.45%)   . sodium chloride flush (NS) 0.9 % injection 3 mL Patient Transfer  . sodium chloride flush (NS) 0.9 % injection 3 mL Patient Transfer  . 0.9 %  sodium chloride infusion Patient Transfer  . aspirin chewable tablet 81 mg Patient Transfer  . 0.9 %  sodium chloride infusion Patient Transfer    Social History:  reports that he has been smoking cigarettes. He started smoking about 51 years ago. He has a 40.00 pack-year smoking history. He has never used smokeless tobacco. He reports previous alcohol use. He reports that he does not use drugs.  Family History:   Family History  Problem Relation Age of Onset   . Diabetes Mother   . Alcohol abuse Father     Blood pressure 124/69, pulse (!) 51, temperature (!) 97.5 F (36.4 C), temperature source Oral, resp. rate 20, height 6\' 4"  (1.93 m), weight 102.8 kg, SpO2 97 %.   GEN adult male in NAD on room air   HEENT EOMI sclera anicteric NECK  supple  Trachea midline PULM normal WOB, clear but reduced  CV RRR no rub  ABD soft, nontender, nondistended EXT tr LE edema NEURO AAO x 3 no asterixis ACCESS: L BC fistula augments nicely,  R IJ TDC       Tamzin Bertling, Hunt Oris, MD 07/23/2019, 5:04 PM

## 2019-07-23 NOTE — Interval H&P Note (Signed)
History and Physical Interval Note:  07/23/2019 1:07 PM  Sung C Edelen  has presented today for surgery, with the diagnosis of unstable angina.  The various methods of treatment have been discussed with the patient and family. After consideration of risks, benefits and other options for treatment, the patient has consented to  Procedure(s): LEFT HEART CATH AND CORONARY ANGIOGRAPHY (N/A) as a surgical intervention.  The patient's history has been reviewed, patient examined, no change in status, stable for surgery.  I have reviewed the patient's chart and labs.  Questions were answered to the patient's satisfaction.   Cath Lab Visit (complete for each Cath Lab visit)  Clinical Evaluation Leading to the Procedure:   ACS: Yes.    Non-ACS:    Anginal Classification: CCS III  Anti-ischemic medical therapy: Maximal Therapy (2 or more classes of medications)  Non-Invasive Test Results: No non-invasive testing performed  Prior CABG: No previous CABG        Collier Salina [facility] 07/23/2019 1:07 PM

## 2019-07-23 NOTE — Progress Notes (Signed)
PROGRESS NOTE    [name]  ZCH:[id number] DOB: May 28, 1950 DOA: 07/22/2019 PCP: Manon Hilding, MD     Brief Narrative:  69 year old man admitted from home on 3/4 after experiencing more epigastric than chest pain following dialysis session.  He describes this as a "soreness" that radiates into the back.  This has been ongoing for at least a few months.  Upon further questioning he has chest pain when climbing stairs that alleviates with rest.  His past medical history significant for end-stage renal disease on hemodialysis on a Tuesday, Thursday, Saturday schedule, coronary artery disease status post stenting to the RCA 99, COPD.  He was found to have high-sensitivity troponins in the 200 range and admission was requested.  Cardiology has seen him and is planning to take him for cardiac catheterization on 3/5.   Assessment & Plan:   Principal Problem:   NSTEMI (non-ST elevated myocardial infarction) ([facility]) Active Problems:   Generalized anxiety disorder   COPD (chronic obstructive pulmonary disease) (H[facility])   Benign essential HTN   CAD (coronary artery disease)   Atrial flutter (HCC)   Chronic diastolic CHF (congestive heart failure) (HCC)   Non-ST elevated MI -Evaluated by cardiology with plans for cardiac catheterization today. -He is currently chest pain-free. -He is on aspirin, Coreg, no ACE inhibitor/ARB due to end-stage renal disease. -LDL is 36, is on Lipitor 40 mg.  End-stage renal disease on hemodialysis -He dialyzes in [place] on a Tuesday, Thursday, Saturday schedule. -Have discussed with nephrology Dr. Augustin Coupe so we can dialyze him on schedule while he is hospitalized.  Chronic diastolic heart failure -Is compensated at this time  Atrial flutter -Rate controlled on Coreg and Cardizem. -Does not appear to be on anticoagulation.  Hypertension -Well-controlled.  COPD -Compensated.   DVT prophylaxis: Currently on IV heparin Code Status: Full  code Family Communication: Patient only Disposition Plan: Anticipate home once medically stable in 1-2 days pending results of heart catheterization.  Consultants:   Cardiology  Nephrology  Procedures:   None  Antimicrobials:  Anti-infectives (From admission, onward)   None       Subjective: Lying in bed, currently chest pain-free, denies shortness of breath.  Objective: Vitals:   07/23/19 0500 07/23/19 0518 07/23/19 0600 07/23/19 0739  BP:   (!) 142/50 (!) 148/66  Pulse: (!) 59  (!) 58 60  Resp: 16  18 20   Temp:  97.9 F (36.6 C)  (!) 97.5 F (36.4 C)  TempSrc:  Oral  Oral  SpO2: 95% 96% (!) 86% 96%  Weight:  102.8 kg    Height:        Intake/Output Summary (Last 24 hours) at 07/23/2019 1106 Last data filed at 07/23/2019 2878 Gross per 24 hour  Intake 106.2 ml  Output 0 ml  Net 106.2 ml   Filed Weights   07/22/19 1337 07/22/19 2338 07/23/19 0518  Weight: 104.3 kg 102.3 kg 102.8 kg    Examination:  General exam: Alert, awake, oriented x 3, wears corrective lenses Respiratory system: Clear to auscultation. Respiratory effort normal. Cardiovascular system:RRR. No murmurs, rubs, gallops. Gastrointestinal system: Abdomen is nondistended, soft and nontender. No organomegaly or masses felt. Normal bowel sounds heard. Central nervous system: Alert and oriented. No focal neurological deficits. Extremities: No C/C/E, +pedal pulses Skin: No rashes, lesions or ulcers Psychiatry: Judgement and insight appear normal. Mood & affect appropriate.     Data Reviewed: I have personally reviewed following labs and imaging studies  CBC: Recent Labs  Lab 07/22/19 1343 07/23/19 0557  WBC 9.7 8.4  HGB 13.4 12.8*  HCT 40.8 39.1  MCV 104.6* 102.4*  PLT 202 188   Basic Metabolic Panel: Recent Labs  Lab 07/22/19 1343 07/23/19 0557  NA 133* 133*  K 4.3 5.0  CL 91* 94*  CO2 28 26  GLUCOSE 115* 83  BUN 20 31*  CREATININE 5.32* 6.77*  CALCIUM 9.0 8.9    GFR: Estimated Creatinine Clearance: 13.2 mL/min (A) (by C-G formula based on SCr of 6.77 mg/dL (H)). Liver Function Tests: Recent Labs  Lab 07/22/19 1343  AST 19  ALT 15  ALKPHOS 59  BILITOT 0.7  PROT 8.0  ALBUMIN 3.6   Recent Labs  Lab 07/22/19 1343  LIPASE 38   No results for input(s): AMMONIA in the last 168 hours. Coagulation Profile: No results for input(s): INR, PROTIME in the last 168 hours. Cardiac Enzymes: No results for input(s): CKTOTAL, CKMB, CKMBINDEX, TROPONINI in the last 168 hours. BNP (last 3 results) No results for input(s): PROBNP in the last 8760 hours. HbA1C: No results for input(s): HGBA1C in the last 72 hours. CBG: No results for input(s): GLUCAP in the last 168 hours. Lipid Profile: Recent Labs    07/23/19 0557  CHOL 93  HDL 38*  LDLCALC 36  TRIG 95  CHOLHDL 2.4   Thyroid Function Tests: No results for input(s): TSH, T4TOTAL, FREET4, T3FREE, THYROIDAB in the last 72 hours. Anemia Panel: No results for input(s): VITAMINB12, FOLATE, FERRITIN, TIBC, IRON, RETICCTPCT in the last 72 hours. Urine analysis:    Component Value Date/Time   COLORURINE YELLOW 07/22/2019 1625   APPEARANCEUR HAZY (A) 07/22/2019 1625   LABSPEC 1.010 07/22/2019 1625   PHURINE 6.0 07/22/2019 1625   GLUCOSEU NEGATIVE 07/22/2019 1625   HGBUR NEGATIVE 07/22/2019 1625   BILIRUBINUR NEGATIVE 07/22/2019 1625   KETONESUR NEGATIVE 07/22/2019 1625   PROTEINUR >=300 (A) 07/22/2019 1625   UROBILINOGEN 0.2 09/27/2014 0027   NITRITE NEGATIVE 07/22/2019 1625   LEUKOCYTESUR NEGATIVE 07/22/2019 1625   Sepsis Labs: @LABRCNTIP (procalcitonin:4,lacticidven:4)  ) Recent Results (from the past 240 hour(s))  SARS CORONAVIRUS 2 (TAT 6-24 HRS) Nasopharyngeal Nasopharyngeal Swab     Status: None   Collection Time: 07/22/19  8:53 PM   Specimen: Nasopharyngeal Swab  Result Value Ref Range Status   SARS Coronavirus 2 NEGATIVE NEGATIVE Final    Comment: (NOTE) SARS-CoV-2 target  nucleic acids are NOT DETECTED. The SARS-CoV-2 RNA is generally detectable in upper and lower respiratory specimens during the acute phase of infection. Negative results do not preclude SARS-CoV-2 infection, do not rule out co-infections with other pathogens, and should not be used as the sole basis for treatment or other patient management decisions. Negative results must be combined with clinical observations, patient history, and epidemiological information. The expected result is Negative. Fact Sheet for Patients: SugarRoll.be Fact Sheet for Healthcare Providers: https://www.woods-mathews.com/ This test is not yet approved or cleared by the [place] FDA and  has been authorized for detection and/or diagnosis of SARS-CoV-2 by FDA under an Emergency Use Authorization (EUA). This EUA will remain  in effect (meaning this test can be used) for the duration of the COVID-19 declaration under Section 56 4(b)(1) of the Act, 21 U.S.C. section 360bbb-3(b)(1), unless the authorization is terminated or revoked sooner. Performed at [facility], [place] [address], [place], [place] [address]          Radiology Studies: CT Angio Chest/Abd/Pel for Dissection W and/or Wo Contrast  Result  Date: 07/22/2019 CLINICAL DATA:  Chest pain and abdominal pain for 1 day EXAM: CT ANGIOGRAPHY CHEST, ABDOMEN AND PELVIS TECHNIQUE: Multidetector CT imaging through the chest, abdomen and pelvis was performed using the standard protocol during bolus administration of intravenous contrast. Multiplanar reconstructed images and MIPs were obtained and reviewed to evaluate the vascular anatomy. CONTRAST:  17mL OMNIPAQUE IOHEXOL 350 MG/ML SOLN COMPARISON:  05/12/2019 FINDINGS: CTA CHEST FINDINGS Cardiovascular: Thoracic aorta demonstrates atherosclerotic calcifications without aneurysmal dilatation or dissection. No significant cardiac enlargement is seen. Heavy coronary  calcifications are noted. The pulmonary artery shows a normal branching pattern without intraluminal filling defect to suggest pulmonary embolism. Right jugular dialysis catheter is noted in satisfactory position. Mediastinum/Nodes: Thoracic inlet is within normal limits. No hilar or mediastinal adenopathy is noted. The esophagus as visualized is within normal limits. Lungs/Pleura: Lungs are well aerated bilaterally. There is a subpleural nodule in the left upper lobe best seen on image number 63 of series 8. This measures approximately 12 mm and is stable from the prior exam. Stable smaller nodules are noted within the left lung is well. No new focal nodule is seen. Mild scarring is noted in the right base. Musculoskeletal: No chest wall abnormality. No acute or significant osseous findings. Old rib fractures are noted with healing bilaterally. Review of the MIP images confirms the above findings. CTA ABDOMEN AND PELVIS FINDINGS VASCULAR Aorta: Abdominal aorta demonstrates atherosclerotic calcifications and mild infrarenal dilatation to 3.7 cm. This is stable from prior exam. No dissection is identified. No new aneurysmal dilatation is seen. Celiac: Calcific changes are noted at the origin although no focal hemodynamically significant stenosis is noted. SMA: Scattered calcified plaque is noted without focal stenosis. Renals: Single renal arteries are identified bilaterally with evidence of atherosclerotic plaque. The overall appearance is stable from the prior exam. IMA: Patent without evidence of aneurysm, dissection, vasculitis or significant stenosis. 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 Veins: No specific venous abnormality is noted Review of the MIP images confirms the above findings. NON-VASCULAR Hepatobiliary: No focal liver abnormality is seen. Status post cholecystectomy. No biliary dilatation. Pancreas: Unremarkable. No pancreatic ductal dilatation  or surrounding inflammatory changes. Spleen: Normal in size without focal abnormality. Adrenals/Urinary Tract: Adrenal glands are within normal limits bilaterally. Kidneys demonstrate a normal enhancement pattern bilaterally. Exophytic lesion is noted from the upper pole of the right kidney stable in appearance from the prior exam. No hydronephrosis is seen. The bladder is decompressed. Stomach/Bowel: The appendix has been surgically removed. No obstructive or inflammatory changes of the large or small bowel are seen. The stomach is within normal limits. Lymphatic: No sizable lymphadenopathy is noted. Reproductive: Prostate is unremarkable. Other: No abdominal wall hernia or abnormality. No abdominopelvic ascites. Musculoskeletal: Changes of prior fusion at L4-5 and L5-S1. No acute bony abnormality is seen. Review of the MIP images confirms the above findings. IMPRESSION: CTA of the chest: Atherosclerotic changes of the aorta without aneurysmal dilatation or dissection. No evidence of pulmonary emboli. Scattered bilateral pulmonary nodules stable in appearance from the prior exam in dating back to 2019. Given their stability they are felt to be benign in etiology. 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 Mild stable dilatation of the right common iliac artery. Stable exophytic lesion in the upper pole of the right kidney. No other focal abnormality is noted. Electronically Signed   By: Inez Catalina M.D.   On: 07/22/2019  20:19        Scheduled Meds: . aspirin  81 mg Oral Pre-Cath  . aspirin EC  81 mg Oral Daily  . atorvastatin  40 mg Oral QPM  . bumetanide  2 mg Oral BID  . carvedilol  25 mg Oral BID  . Chlorhexidine Gluconate Cloth  6 each Topical Daily  . diltiazem  30 mg Oral BID  . pantoprazole  40 mg Oral Daily  . sodium chloride flush  3 mL Intravenous Q12H  . sodium chloride flush  3 mL Intravenous Q12H   Continuous Infusions: . sodium  chloride    . sodium chloride    . sodium chloride 10 mL/hr at 07/23/19 1101  . heparin 1,400 Units/hr (07/23/19 0722)     LOS: 1 day    Time spent: 35 minutes. Greater than 50% of this time was spent in direct contact with the patient, coordinating care and discussing relevant ongoing clinical issues.    Lelon Frohlich, MD [facility] Pager [phone]  If 7PM-7AM, please contact night-coverage [email or web address] Password [email or web address] 07/23/2019, 11:06 AM

## 2019-07-23 NOTE — H&P (View-Only) (Signed)
Progress Note  Patient Name: FREDDI SCHRAGER Date of Encounter: 07/23/2019  Primary Cardiologist: Carlyle Dolly, MD   Subjective   Pt describes lower abdominal pain during HD that radiates to his epigastric region, which sounds atypical for angina. Upon further questioning, he also reports chest tightness when climbing stairs, relieved with rest.  Low risk myoview 2020  Last heart cath 2003 without favorable PCI targets  He continues to smoke.   Inpatient Medications    Scheduled Meds: . aspirin  324 mg Oral NOW   Or  . aspirin  300 mg Rectal NOW  . aspirin EC  81 mg Oral Daily  . atorvastatin  40 mg Oral QPM  . bumetanide  2 mg Oral BID  . carvedilol  25 mg Oral BID  . diltiazem  30 mg Oral BID  . pantoprazole  40 mg Oral Daily  . sodium chloride flush  3 mL Intravenous Q12H   Continuous Infusions: . sodium chloride    . heparin 1,200 Units/hr (07/22/19 2231)   PRN Meds: sodium chloride, acetaminophen, albuterol, Melatonin, nitroGLYCERIN, ondansetron (ZOFRAN) IV, polyethylene glycol, sodium chloride flush   Vital Signs    Vitals:   07/22/19 2338 07/23/19 0500 07/23/19 0518 07/23/19 0600  BP: 119/67   (!) 142/50  Pulse: (!) 59 (!) 59  (!) 58  Resp: 20 16  18   Temp: 97.6 F (36.4 C)  97.9 F (36.6 C)   TempSrc: Oral  Oral   SpO2: 98% 95% 96% (!) 86%  Weight: 102.3 kg  102.8 kg   Height:       No intake or output data in the 24 hours ending 07/23/19 0655 Last 3 Weights 07/23/2019 07/22/2019 07/22/2019  Weight (lbs) 226 lb 9.6 oz 225 lb 9.6 oz 230 lb  Weight (kg) 102.785 kg 102.331 kg 104.327 kg      Telemetry    Paced rhythm - Personally Reviewed  ECG    No new tracings - Personally Reviewed  Physical Exam   GEN: No acute distress.   Neck: No JVD Cardiac: RRR, exam difficult with active wheezing Respiratory: respirations unlabored, wheezing throughout GI: Soft, nontender, non-distended  MS: No edema; No deformity. Missing right second toe,  necrotic looking tip of third toe Neuro:  Nonfocal  Psych: Normal affect   Labs    High Sensitivity Troponin:   Recent Labs  Lab 07/22/19 1343 07/22/19 1548  TROPONINIHS 229* 209*      Chemistry Recent Labs  Lab 07/22/19 1343 07/23/19 0557  NA 133* 133*  K 4.3 5.0  CL 91* 94*  CO2 28 26  GLUCOSE 115* 83  BUN 20 31*  CREATININE 5.32* 6.77*  CALCIUM 9.0 8.9  PROT 8.0  --   ALBUMIN 3.6  --   AST 19  --   ALT 15  --   ALKPHOS 59  --   BILITOT 0.7  --   GFRNONAA 10* 8*  GFRAA 12* 9*  ANIONGAP 14 13     Hematology Recent Labs  Lab 07/22/19 1343 07/23/19 0557  WBC 9.7 8.4  RBC 3.90* 3.82*  HGB 13.4 12.8*  HCT 40.8 39.1  MCV 104.6* 102.4*  MCH 34.4* 33.5  MCHC 32.8 32.7  RDW 17.0* 16.8*  PLT 202 168    BNPNo results for input(s): BNP, PROBNP in the last 168 hours.   DDimer No results for input(s): DDIMER in the last 168 hours.   Radiology    CT Angio Chest/Abd/Pel for Dissection W  and/or Wo Contrast  Result Date: 07/22/2019 CLINICAL DATA:  Chest pain and abdominal pain for 1 day EXAM: CT ANGIOGRAPHY CHEST, ABDOMEN AND PELVIS TECHNIQUE: Multidetector CT imaging through the chest, abdomen and pelvis was performed using the standard protocol during bolus administration of intravenous contrast. Multiplanar reconstructed images and MIPs were obtained and reviewed to evaluate the vascular anatomy. CONTRAST:  174mL OMNIPAQUE IOHEXOL 350 MG/ML SOLN COMPARISON:  05/12/2019 FINDINGS: CTA CHEST FINDINGS Cardiovascular: Thoracic aorta demonstrates atherosclerotic calcifications without aneurysmal dilatation or dissection. No significant cardiac enlargement is seen. Heavy coronary calcifications are noted. The pulmonary artery shows a normal branching pattern without intraluminal filling defect to suggest pulmonary embolism. Right jugular dialysis catheter is noted in satisfactory position. Mediastinum/Nodes: Thoracic inlet is within normal limits. No hilar or mediastinal  adenopathy is noted. The esophagus as visualized is within normal limits. Lungs/Pleura: Lungs are well aerated bilaterally. There is a subpleural nodule in the left upper lobe best seen on image number 63 of series 8. This measures approximately 12 mm and is stable from the prior exam. Stable smaller nodules are noted within the left lung is well. No new focal nodule is seen. Mild scarring is noted in the right base. Musculoskeletal: No chest wall abnormality. No acute or significant osseous findings. Old rib fractures are noted with healing bilaterally. Review of the MIP images confirms the above findings. CTA ABDOMEN AND PELVIS FINDINGS VASCULAR Aorta: Abdominal aorta demonstrates atherosclerotic calcifications and mild infrarenal dilatation to 3.7 cm. This is stable from prior exam. No dissection is identified. No new aneurysmal dilatation is seen. Celiac: Calcific changes are noted at the origin although no focal hemodynamically significant stenosis is noted. SMA: Scattered calcified plaque is noted without focal stenosis. Renals: Single renal arteries are identified bilaterally with evidence of atherosclerotic plaque. The overall appearance is stable from the prior exam. IMA: Patent without evidence of aneurysm, dissection, vasculitis or significant stenosis. 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 Veins: No specific venous abnormality is noted Review of the MIP images confirms the above findings. NON-VASCULAR Hepatobiliary: No focal liver abnormality is seen. Status post cholecystectomy. No biliary dilatation. Pancreas: Unremarkable. No pancreatic ductal dilatation or surrounding inflammatory changes. Spleen: Normal in size without focal abnormality. Adrenals/Urinary Tract: Adrenal glands are within normal limits bilaterally. Kidneys demonstrate a normal enhancement pattern bilaterally. Exophytic lesion is noted from the upper pole of the right kidney  stable in appearance from the prior exam. No hydronephrosis is seen. The bladder is decompressed. Stomach/Bowel: The appendix has been surgically removed. No obstructive or inflammatory changes of the large or small bowel are seen. The stomach is within normal limits. Lymphatic: No sizable lymphadenopathy is noted. Reproductive: Prostate is unremarkable. Other: No abdominal wall hernia or abnormality. No abdominopelvic ascites. Musculoskeletal: Changes of prior fusion at L4-5 and L5-S1. No acute bony abnormality is seen. Review of the MIP images confirms the above findings. IMPRESSION: CTA of the chest: Atherosclerotic changes of the aorta without aneurysmal dilatation or dissection. No evidence of pulmonary emboli. Scattered bilateral pulmonary nodules stable in appearance from the prior exam in dating back to 2019. Given their stability they are felt to be benign in etiology. 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 Mild stable dilatation of the right common iliac artery. Stable exophytic lesion in the upper pole of the right kidney. No other focal abnormality is noted. Electronically Signed   By: Inez Catalina  M.D.   On: 07/22/2019 20:19    Cardiac Studies   Nuclear stress test 12/15/18  There was no ST segment deviation noted during stress.  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  This is a low risk study.  The left ventricular ejection fraction is mildly decreased by nuclear imaging (46%). By echo done same day LVEF is 60-65%. Likely processing error by nuclear study, 60-65% would be considered the true LVEF.   Patient Profile     69 y.o. male with a hx of A. Fib, CAD s/p PCI, ESRD on iHD, HTN, HLD, and PVD who is being seen today for the evaluation of chest/abdominal pain.  Assessment & Plan    1. Chest pain 2. CAD s/p RCA stent (1999), last heart cath 2003 with moderate lesions - noted to  be not favorable for PCI, treated medically - reports chest pain during HD in his abdomen and epigastric region that lasts for several hours after HD - also reports chest pressure when climbing stairs, relieved with rest - hs troponin 229 --> 209 - EKG with right axis deviation and repolarization abnormality - known disease, low risk NM 11/2018, poor targets, will ask Dr. Burt Knack to review last cath films prior to proceeding with angiography today   3. ESRD on HD - has been compliant - TThSat - has right HD catheter,  Left arm fistula, PPM in place   4. Hypertension - hydralazine 100 mg TID   5. Hyperlipidemia - 07/23/2019: Cholesterol 93; HDL 38; LDL Cholesterol 36; Triglycerides 95; VLDL 19 - on lipitor 40 mg   6. 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 PAF/flutter - Afib burden was low at last device check in 10/2018 - eliquis was D/C'ed with anemia and CKD at clinic visit 10/2018 (also with GI bleeding) - BB, CCB      For questions or updates, please contact [facility] Please consult [email or web address] for contact info under        Signed, Ledora Bottcher, PA  07/23/2019, 6:55 AM    Patient seen, examined. Available data reviewed. Agree with findings, assessment, and plan as outlined by Doreene Adas, PA-C.  The patient is independently interviewed and examined.  He is alert, oriented, in no distress.  HEENT is normal, neck with normal JVP, lungs are clear bilaterally, heart is regular rate and rhythm without murmur gallop, abdomen is soft and nontender, extremities show no edema.  The patient presents with non-STEMI, mild troponin elevation with a high-sensitivity troponin peak of 229.  He has had progressive symptoms of chest and abdominal discomfort during dialysis.  I have recommended proceeding with cardiac catheterization because of his worsening pain that may be representative of cardiac ischemia.  The patient had a heart  catheterization in 2003 and was noted to have moderate diffuse LAD and circumflex stenosis and continued patency of an RCA stent that was placed in 1999.  He had relatively small coronary arteries.  Considering the 18-year time interval from his last heart catheterization, I think a coronary angiogram to reassess his coronary anatomy is indicated. I have reviewed the risks, indications, and alternatives to cardiac catheterization, possible angioplasty, and stenting with the patient. Risks include but are not limited to bleeding, infection, vascular injury, stroke, myocardial infection, arrhythmia, kidney injury, radiation-related injury in the case of prolonged fluoroscopy use, emergency cardiac surgery, and death. The patient understands the risks of serious complication is 1-2 in 9390 with diagnostic  cardiac cath and 1-2% or less with angioplasty/stenting.  Would recommend femoral access since he has end-stage renal disease.  He has had some problems with GI bleeding and I reviewed his upper endoscopy and colonoscopy reports from last year.  His upper endoscopy was essentially clear.  His colonoscopy showed a few polyps that were snared at the time of the procedure.  He also had a single AVM that was treated.  His hemoglobin is stable at 12.8 mg/dL.  I think if he requires PCI, he should be a candidate for dual antiplatelet therapy at least for short-term treatment.  We discussed all of this today and he understands the risks of recurrent bleeding if he requires aspirin and clopidogrel.  I would not treat him with a potent antiplatelet drug like ticagrelor.  Sherren Mocha, M.D. 07/23/2019 9:03 AM

## 2019-07-24 ENCOUNTER — Encounter (HOSPITAL_COMMUNITY): Payer: Self-pay | Admitting: Family Medicine

## 2019-07-24 LAB — CBC
HCT: 34.1 % — ABNORMAL LOW (ref 39.0–52.0)
HCT: 35.6 % — ABNORMAL LOW (ref 39.0–52.0)
Hemoglobin: 11.4 g/dL — ABNORMAL LOW (ref 13.0–17.0)
Hemoglobin: 11.8 g/dL — ABNORMAL LOW (ref 13.0–17.0)
MCH: 34.4 pg — ABNORMAL HIGH (ref 26.0–34.0)
MCH: 34 pg (ref 26.0–34.0)
MCHC: 33.4 g/dL (ref 30.0–36.0)
MCHC: 33.1 g/dL (ref 30.0–36.0)
MCV: 103 fL — ABNORMAL HIGH (ref 80.0–100.0)
MCV: 102.6 fL — ABNORMAL HIGH (ref 80.0–100.0)
Platelets: 188 10*3/uL (ref 150–400)
Platelets: 144 10*3/uL — ABNORMAL LOW (ref 150–400)
RBC: 3.31 MIL/uL — ABNORMAL LOW (ref 4.22–5.81)
RBC: 3.47 MIL/uL — ABNORMAL LOW (ref 4.22–5.81)
RDW: 17 % — ABNORMAL HIGH (ref 11.5–15.5)
RDW: 17.1 % — ABNORMAL HIGH (ref 11.5–15.5)
WBC: 7.6 10*3/uL (ref 4.0–10.5)
WBC: 7.8 10*3/uL (ref 4.0–10.5)
nRBC: 0.3 % — ABNORMAL HIGH (ref 0.0–0.2)
nRBC: 0 % (ref 0.0–0.2)

## 2019-07-24 LAB — HEPATITIS B SURFACE ANTIBODY,QUALITATIVE: Hep B S Ab: REACTIVE — AB

## 2019-07-24 LAB — BASIC METABOLIC PANEL
Anion gap: 15 (ref 5–15)
BUN: 46 mg/dL — ABNORMAL HIGH (ref 8–23)
CO2: 25 mmol/L (ref 22–32)
Calcium: 8.7 mg/dL — ABNORMAL LOW (ref 8.9–10.3)
Chloride: 90 mmol/L — ABNORMAL LOW (ref 98–111)
Creatinine, Ser: 8.2 mg/dL — ABNORMAL HIGH (ref 0.61–1.24)
GFR calc Af Amer: 7 mL/min — ABNORMAL LOW (ref >60)
GFR calc non Af Amer: 6 mL/min — ABNORMAL LOW (ref >60)
Glucose, Bld: 88 mg/dL (ref 70–99)
Potassium: 5.1 mmol/L (ref 3.5–5.1)
Sodium: 130 mmol/L — ABNORMAL LOW (ref 135–145)

## 2019-07-24 LAB — RENAL FUNCTION PANEL
Albumin: 3.2 g/dL — ABNORMAL LOW (ref 3.5–5.0)
Anion gap: 13 (ref 5–15)
BUN: 20 mg/dL (ref 8–23)
CO2: 29 mmol/L (ref 22–32)
Calcium: 8.9 mg/dL (ref 8.9–10.3)
Chloride: 96 mmol/L — ABNORMAL LOW (ref 98–111)
Creatinine, Ser: 4.91 mg/dL — ABNORMAL HIGH (ref 0.61–1.24)
GFR calc Af Amer: 13 mL/min — ABNORMAL LOW (ref >60)
GFR calc non Af Amer: 11 mL/min — ABNORMAL LOW (ref >60)
Glucose, Bld: 96 mg/dL (ref 70–99)
Phosphorus: 4.5 mg/dL (ref 2.5–4.6)
Potassium: 4.3 mmol/L (ref 3.5–5.1)
Sodium: 138 mmol/L (ref 135–145)

## 2019-07-24 LAB — HEPATITIS B CORE ANTIBODY, TOTAL: Hep B Core Total Ab: NONREACTIVE

## 2019-07-24 LAB — HEPATITIS B SURFACE ANTIGEN: Hepatitis B Surface Ag: NONREACTIVE

## 2019-07-24 MED ORDER — HEPARIN SODIUM (PORCINE) 1000 UNIT/ML DIALYSIS
1000.0000 [IU] | INTRAMUSCULAR | Status: DC | PRN
  Administered 2019-07-24: 1000 [IU] via INTRAVENOUS_CENTRAL

## 2019-07-24 MED ORDER — LIDOCAINE-PRILOCAINE 2.5-2.5 % EX CREA
1.0000 "application " | TOPICAL_CREAM | CUTANEOUS | Status: DC | PRN
  Filled 2019-07-24: qty 5

## 2019-07-24 MED ORDER — SODIUM CHLORIDE 0.9 % IV SOLN: 100.0000 mL | INTRAVENOUS | Status: DC | PRN

## 2019-07-24 MED ORDER — PENTAFLUOROPROP-TETRAFLUOROETH EX AERO: 1.0000 "application " | INHALATION_SPRAY | CUTANEOUS | Status: DC | PRN

## 2019-07-24 MED ORDER — ISOSORBIDE MONONITRATE ER 60 MG PO TB24
60.0000 mg | ORAL_TABLET | Freq: Every day | ORAL | Status: DC
  Administered 2019-07-25: 60 mg via ORAL
  Filled 2019-07-24: qty 1

## 2019-07-24 MED ORDER — HEPARIN SODIUM (PORCINE) 1000 UNIT/ML IJ SOLN
INTRAMUSCULAR | Status: AC
  Filled 2019-07-24: qty 4

## 2019-07-24 MED ORDER — VANCOMYCIN HCL IN DEXTROSE 1-5 GM/200ML-% IV SOLN
INTRAVENOUS | Status: AC
  Filled 2019-07-24: qty 200

## 2019-07-24 MED ORDER — LIDOCAINE HCL (PF) 1 % IJ SOLN: 5.0000 mL | INTRAMUSCULAR | Status: DC | PRN

## 2019-07-24 MED ORDER — ALTEPLASE 2 MG IJ SOLR: 2.0000 mg | Freq: Once | INTRAMUSCULAR | Status: DC | PRN

## 2019-07-24 MED ORDER — DARBEPOETIN ALFA 100 MCG/0.5ML IJ SOSY
PREFILLED_SYRINGE | INTRAMUSCULAR | Status: AC
  Filled 2019-07-24: qty 0.5

## 2019-07-24 MED ORDER — VANCOMYCIN HCL IN DEXTROSE 1-5 GM/200ML-% IV SOLN
INTRAVENOUS | Status: AC
  Administered 2019-07-24: 1000 mg via INTRAVENOUS_CENTRAL
  Filled 2019-07-24: qty 200

## 2019-07-24 NOTE — Progress Notes (Signed)
PROGRESS NOTE    [name]  GNF:[id number] DOB: 1951/03/29 DOA: 07/22/2019 PCP: Manon Hilding, MD     Brief Narrative:  69 year old man admitted from home on 3/4 after experiencing more epigastric than chest pain following dialysis session.  He describes this as a "soreness" that radiates into the back.  This has been ongoing for at least a few months.  Upon further questioning he has chest pain when climbing stairs that alleviates with rest.  His past medical history significant for end-stage renal disease on hemodialysis on a Tuesday, Thursday, Saturday schedule, coronary artery disease status post stenting to the RCA 99, COPD.  He was found to have high-sensitivity troponins in the 200 range and admission was requested.  Cardiology has seen him and is planning to take him for cardiac catheterization on 3/5.   Assessment & Plan:   Principal Problem:   NSTEMI (non-ST elevated myocardial infarction) ([facility]) Active Problems:   Generalized anxiety disorder   COPD (chronic obstructive pulmonary disease) (H[facility])   Benign essential HTN   CAD (coronary artery disease)   Atrial flutter (HCC)   Chronic diastolic CHF (congestive heart failure) (H[facility])   Non-ST elevated MI -Evaluated by cardiology with plans for cardiac catheterization today. -He is currently chest pain-free. -LDL is 36, is on Lipitor 40 mg. -Had cath 3/5 with significant CAD but felt difficult approach to correct percutaneously. Medical Rx has been recommended. -ASA/BB/cardizem/Imdur at increased dose of 60. -Appreciate cards following. -CTA: No evidence for mesenteric ischemia.  End-stage renal disease on hemodialysis -He dialyzes in [place] on a Tuesday, Thursday, Saturday schedule. -For HD today.  Chronic diastolic heart failure -Is compensated at this time  Atrial flutter -Rate controlled on Coreg and Cardizem. -Does not appear to be on  anticoagulation.  Hypertension -Well-controlled.  COPD -Compensated.   DVT prophylaxis: Currently on IV heparin Code Status: Full code Family Communication: Patient only Disposition Plan: Anticipate home once medically stable in 24-48 hours. catheterization.  Consultants:   Cardiology  Nephrology  Procedures:   None  Antimicrobials:  Anti-infectives (From admission, onward)   None       Subjective: In bed, currently pain free and no SOB.  Objective: Vitals:   07/24/19 0000 07/24/19 0600 07/24/19 1133 07/24/19 1205  BP: (!) 122/59 (!) 103/46 (!) 143/65 (!) 143/68  Pulse: (!) 59 60 60 60  Resp: 20 19 20  (!) 23  Temp:  97.8 F (36.6 C) (!) 97.4 F (36.3 C) 97.8 F (36.6 C)  TempSrc:   Oral Oral  SpO2: 95% 92% 94% 95%  Weight:  104 kg  104.2 kg  Height:        Intake/Output Summary (Last 24 hours) at 07/24/2019 1226 Last data filed at 07/24/2019 1030 Gross per 24 hour  Intake 126 ml  Output 775 ml  Net -649 ml   Filed Weights   07/23/19 0518 07/24/19 0600 07/24/19 1205  Weight: 102.8 kg 104 kg 104.2 kg    Examination:  General exam: Alert, awake, oriented x 3, wears corrective lenses Respiratory system: Clear to auscultation. Respiratory effort normal. Cardiovascular system:RRR. No murmurs, rubs, gallops. Gastrointestinal system: Abdomen is nondistended, soft and nontender. No organomegaly or masses felt. Normal bowel sounds heard. Central nervous system: Alert and oriented. No focal neurological deficits. Extremities: No C/C/E, +pedal pulses Skin: No rashes, lesions or ulcers Psychiatry: Judgement and insight appear normal. Mood & affect appropriate.     Data Reviewed: I have personally reviewed following labs and imaging studies  CBC: Recent Labs  Lab 07/22/19 1343 07/23/19 0557 07/24/19 0527  WBC 9.7 8.4 7.6  HGB 13.4 12.8* 11.4*  HCT 40.8 39.1 34.1*  MCV 104.6* 102.4* 103.0*  PLT 202 168 161   Basic Metabolic Panel: Recent Labs   Lab 07/22/19 1343 07/23/19 0557 07/24/19 0527  NA 133* 133* 130*  K 4.3 5.0 5.1  CL 91* 94* 90*  CO2 28 26 25   GLUCOSE 115* 83 88  BUN 20 31* 46*  CREATININE 5.32* 6.77* 8.20*  CALCIUM 9.0 8.9 8.7*   GFR: Estimated Creatinine Clearance: 11.8 mL/min (A) (by C-G formula based on SCr of 8.2 mg/dL (H)). Liver Function Tests: Recent Labs  Lab 07/22/19 1343  AST 19  ALT 15  ALKPHOS 59  BILITOT 0.7  PROT 8.0  ALBUMIN 3.6   Recent Labs  Lab 07/22/19 1343  LIPASE 38   No results for input(s): AMMONIA in the last 168 hours. Coagulation Profile: No results for input(s): INR, PROTIME in the last 168 hours. Cardiac Enzymes: No results for input(s): CKTOTAL, CKMB, CKMBINDEX, TROPONINI in the last 168 hours. BNP (last 3 results) No results for input(s): PROBNP in the last 8760 hours. HbA1C: No results for input(s): HGBA1C in the last 72 hours. CBG: Recent Labs  Lab 07/23/19 1423  GLUCAP 65*   Lipid Profile: Recent Labs    07/23/19 0557  CHOL 93  HDL 38*  LDLCALC 36  TRIG 95  CHOLHDL 2.4   Thyroid Function Tests: No results for input(s): TSH, T4TOTAL, FREET4, T3FREE, THYROIDAB in the last 72 hours. Anemia Panel: No results for input(s): VITAMINB12, FOLATE, FERRITIN, TIBC, IRON, RETICCTPCT in the last 72 hours. Urine analysis:    Component Value Date/Time   COLORURINE YELLOW 07/22/2019 1625   APPEARANCEUR HAZY (A) 07/22/2019 1625   LABSPEC 1.010 07/22/2019 1625   PHURINE 6.0 07/22/2019 1625   GLUCOSEU NEGATIVE 07/22/2019 1625   HGBUR NEGATIVE 07/22/2019 1625   BILIRUBINUR NEGATIVE 07/22/2019 1625   KETONESUR NEGATIVE 07/22/2019 1625   PROTEINUR >=300 (A) 07/22/2019 1625   UROBILINOGEN 0.2 09/27/2014 0027   NITRITE NEGATIVE 07/22/2019 1625   LEUKOCYTESUR NEGATIVE 07/22/2019 1625   Sepsis Labs: @LABRCNTIP (procalcitonin:4,lacticidven:4)  ) Recent Results (from the past 240 hour(s))  SARS CORONAVIRUS 2 (TAT 6-24 HRS) Nasopharyngeal Nasopharyngeal Swab      Status: None   Collection Time: 07/22/19  8:53 PM   Specimen: Nasopharyngeal Swab  Result Value Ref Range Status   SARS Coronavirus 2 NEGATIVE NEGATIVE Final    Comment: (NOTE) SARS-CoV-2 target nucleic acids are NOT DETECTED. The SARS-CoV-2 RNA is generally detectable in upper and lower respiratory specimens during the acute phase of infection. Negative results do not preclude SARS-CoV-2 infection, do not rule out co-infections with other pathogens, and should not be used as the sole basis for treatment or other patient management decisions. Negative results must be combined with clinical observations, patient history, and epidemiological information. The expected result is Negative. Fact Sheet for Patients: SugarRoll.be Fact Sheet for Healthcare Providers: https://www.woods-mathews.com/ This test is not yet approved or cleared by the [place] FDA and  has been authorized for detection and/or diagnosis of SARS-CoV-2 by FDA under an Emergency Use Authorization (EUA). This EUA will remain  in effect (meaning this test can be used) for the duration of the COVID-19 declaration under Section 56 4(b)(1) of the Act, 21 U.S.C. section 360bbb-3(b)(1), unless the authorization is terminated or revoked sooner. Performed at [facility], [place] [address] [place], [place] [address]  Radiology Studies: CARDIAC CATHETERIZATION  Result Date: 07/23/2019  Mid LM to Dist LM lesion is 25% stenosed.  Mid LAD lesion is 40% stenosed.  1st Diag-1 lesion is 80% stenosed.  1st Diag-2 lesion is 90% stenosed.  1st Diag-3 lesion is 90% stenosed.  Ost Cx to Prox Cx lesion is 75% stenosed.  Mid Cx lesion is 90% stenosed.  Prox RCA lesion is 35% stenosed.  Dist RCA lesion is 50% stenosed.  The left ventricular systolic function is normal.  LV end diastolic pressure is normal.  The left ventricular ejection fraction is 55-65% by visual  estimate.  1. Severe 2 vessel obstructive CAD. The RCA and LAD disease is stable compared to 2003. There is progressive disease in a large diagonal branch and the LCx    - the diagonal is a large bifurcating vessel.  It has multiple severe stenoses in the main branch with severely calcified lesions    - 75% proximal LCx and 90% mid LCx. There is an acutely angulated take off from the left main and the vessel is severely calcified. 2. Normal LV function 3. Normal LVEDP Plan: discussed with Dr Burt Knack. The diagonal is poorly suited for PCI due to diffuse and heavily calcified disease. The LCx is complex and would require atherectomy. The acute angulation would make it technically difficult. For now would recommend aggressive medical therapy.   CT Angio Chest/Abd/Pel for Dissection W and/or Wo Contrast  Result Date: 07/22/2019 CLINICAL DATA:  Chest pain and abdominal pain for 1 day EXAM: CT ANGIOGRAPHY CHEST, ABDOMEN AND PELVIS TECHNIQUE: Multidetector CT imaging through the chest, abdomen and pelvis was performed using the standard protocol during bolus administration of intravenous contrast. Multiplanar reconstructed images and MIPs were obtained and reviewed to evaluate the vascular anatomy. CONTRAST:  152mL OMNIPAQUE IOHEXOL 350 MG/ML SOLN COMPARISON:  05/12/2019 FINDINGS: CTA CHEST FINDINGS Cardiovascular: Thoracic aorta demonstrates atherosclerotic calcifications without aneurysmal dilatation or dissection. No significant cardiac enlargement is seen. Heavy coronary calcifications are noted. The pulmonary artery shows a normal branching pattern without intraluminal filling defect to suggest pulmonary embolism. Right jugular dialysis catheter is noted in satisfactory position. Mediastinum/Nodes: Thoracic inlet is within normal limits. No hilar or mediastinal adenopathy is noted. The esophagus as visualized is within normal limits. Lungs/Pleura: Lungs are well aerated bilaterally. There is a subpleural nodule in  the left upper lobe best seen on image number 63 of series 8. This measures approximately 12 mm and is stable from the prior exam. Stable smaller nodules are noted within the left lung is well. No new focal nodule is seen. Mild scarring is noted in the right base. Musculoskeletal: No chest wall abnormality. No acute or significant osseous findings. Old rib fractures are noted with healing bilaterally. Review of the MIP images confirms the above findings. CTA ABDOMEN AND PELVIS FINDINGS VASCULAR Aorta: Abdominal aorta demonstrates atherosclerotic calcifications and mild infrarenal dilatation to 3.7 cm. This is stable from prior exam. No dissection is identified. No new aneurysmal dilatation is seen. Celiac: Calcific changes are noted at the origin although no focal hemodynamically significant stenosis is noted. SMA: Scattered calcified plaque is noted without focal stenosis. Renals: Single renal arteries are identified bilaterally with evidence of atherosclerotic plaque. The overall appearance is stable from the prior exam. IMA: Patent without evidence of aneurysm, dissection, vasculitis or significant stenosis. 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 Veins: No specific venous abnormality is noted Review of the MIP images confirms the above  findings. NON-VASCULAR Hepatobiliary: No focal liver abnormality is seen. Status post cholecystectomy. No biliary dilatation. Pancreas: Unremarkable. No pancreatic ductal dilatation or surrounding inflammatory changes. Spleen: Normal in size without focal abnormality. Adrenals/Urinary Tract: Adrenal glands are within normal limits bilaterally. Kidneys demonstrate a normal enhancement pattern bilaterally. Exophytic lesion is noted from the upper pole of the right kidney stable in appearance from the prior exam. No hydronephrosis is seen. The bladder is decompressed. Stomach/Bowel: The appendix has been surgically removed. No  obstructive or inflammatory changes of the large or small bowel are seen. The stomach is within normal limits. Lymphatic: No sizable lymphadenopathy is noted. Reproductive: Prostate is unremarkable. Other: No abdominal wall hernia or abnormality. No abdominopelvic ascites. Musculoskeletal: Changes of prior fusion at L4-5 and L5-S1. No acute bony abnormality is seen. Review of the MIP images confirms the above findings. IMPRESSION: CTA of the chest: Atherosclerotic changes of the aorta without aneurysmal dilatation or dissection. No evidence of pulmonary emboli. Scattered bilateral pulmonary nodules stable in appearance from the prior exam in dating back to 2019. Given their stability they are felt to be benign in etiology. 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 Mild stable dilatation of the right common iliac artery. Stable exophytic lesion in the upper pole of the right kidney. No other focal abnormality is noted. Electronically Signed   By: Inez Catalina M.D.   On: 07/22/2019 20:19   CT Angio Abd/Pel w/ and/or w/o  Result Date: 07/24/2019 CLINICAL DATA:  Severe abdominal pain 2 hours into dialysis. Evaluate for mesenteric ischemia. EXAM: CT ANGIOGRAPHY ABDOMEN AND PELVIS WITH CONTRAST AND WITHOUT CONTRAST TECHNIQUE: Multidetector CT imaging of the abdomen and pelvis was performed using the standard protocol during bolus administration of intravenous contrast. Multiplanar reconstructed images and MIPs were obtained and reviewed to evaluate the vascular anatomy. CONTRAST:  115mL OMNIPAQUE IOHEXOL 350 MG/ML SOLN COMPARISON:  07/22/2019 FINDINGS: VASCULAR Aorta: Atherosclerotic calcifications in the descending mild thoracic aorta. Atherosclerotic calcifications and plaque involving the abdominal aorta. Infrarenal abdominal aortic aneurysm measures up to 3.7 cm and stable. Negative for aortic dissection. Celiac: Atherosclerotic plaque at the origin of the celiac  artery trunk without significant stenosis. Left gastric artery, splenic artery and common hepatic artery are patent. SMA: Calcified plaque at the origin of the SMA with less than 50% stenosis. Diffuse atherosclerotic plaque throughout the SMA. Main branches of the SMA are patent. Renals: Origin of the left renal artery is widely patent. Extensive calcified plaque and at least moderate stenosis in the mid left renal artery. Calcified plaque at the origin of the right renal artery with mild stenosis. There is probably hemodynamically significant stenosis in the right renal artery 1.3 cm from the origin. 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 The distal right common iliac artery measures up to 1.9 cm. Right iliac arteries are patent. The right external iliac artery is heavily calcified and suspect at least mild to moderate stenosis in the distal aspect of the right external iliac artery. Ectasia of the left common iliac artery measuring up to 1.6 cm with diffuse atherosclerotic calcifications. Left iliac arteries are patent. Proximal Outflow: Proximal right femoral arteries are heavily calcified but patent. There is a hematoma anterior to the right common femoral artery but no clear evidence for a active bleeding or pseudoaneurysm. Proximal left femoral arteries are patent. Veins: Iliac veins and IVC are patent. Renal veins are patent. Portal venous  system is patent. Review of the MIP images confirms the above findings. NON-VASCULAR Lower chest: New posterior dependent densities in both lower lobes, right side greater than left. Findings likely represent significant volume loss and atelectasis. Cardiac pacer wires. Hepatobiliary: Cholecystectomy. Normal appearance of the liver. No suspicious lesion. No biliary dilatation. Pancreas: Unremarkable. No pancreatic ductal dilatation or surrounding inflammatory changes. Spleen: Normal in size without focal abnormality.  Adrenals/Urinary Tract: Normal appearance of the right adrenal gland. Mild fullness of the left adrenal gland. Again noted is a slightly hyperdense exophytic structure along the upper pole of the right kidney that measures 1.4 cm and measured 0.9 cm on MRI from 10/10/2016. Hounsfield units are roughly 50 and does not significant change between the arterial and venous phase of imaging. No hydronephrosis. Normal appearance of the urinary bladder. Stomach/Bowel: Fat attenuating lesion in the mid transverse colon measures 1.3 cm on sequence 11, image 46. This probably represents a lipoma. Additional lipoma at the hepatic flexure measuring 1.2 cm image 38, sequence 11. Again noted is a metallic clip along the lateral wall the right colon. Surgical bowel anastomosis involving the small bowel in right abdomen. Normal appearance of the stomach and duodenum. No focal bowel inflammation or obstruction. Lymphatic: No abdominopelvic lymphadenopathy. Reproductive: Enlarged prostate measuring 5.7 cm in the transverse dimension. Other: Large amount of high-density fluid in the right groin and proximal right thigh. Findings are suggestive for a hematoma compatible with recent catheterization in this area. Hematoma measures roughly 3.5 cm in the AP dimension. The entire extent of this hematoma is not visualized. Negative for intra-abdominal retroperitoneal hematoma. Negative for ascites. Negative for free air. Musculoskeletal: Old posterior right rib fractures. Interbody fusion at L4-L5 and L5-S1. IMPRESSION: VASCULAR 1. Abdominal aortic aneurysm measuring 3.7 cm. Recommend followup by ultrasound in 2 years. This recommendation follows ACR consensus guidelines: White Paper of the ACR Incidental Findings Committee II on Vascular Findings. J Am Coll Radiol 2013; 10:789-794. Aortic aneurysm NOS (ICD10-I71.9) 2. 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 3. Calcifications at the origin of the  celiac trunk and SMA without significant stenosis. 4. Stenosis involving the bilateral renal arteries, right side greater than left. 5.  Aortic Atherosclerosis (ICD10-I70.0). NON-VASCULAR 1. Hematoma in the right groin compatible with recent catheterization in this area. No clear evidence for active bleeding or pseudoaneurysm. 2. New densities in the posterior lower lungs most compatible with atelectasis and volume loss. 3. No acute intra-abdominal abnormality. 4. Evidence for lipomas involving the colon. 5. Postsurgical bowel changes. No evidence for bowel inflammation or obstruction. 6. Slightly hyperdense exophytic structure involving the right kidney upper pole. This structure is indeterminate but favor hyperdense or proteinaceous cyst. Electronically Signed   By: Markus Daft M.D.   On: 07/24/2019 09:29        Scheduled Meds: . aspirin EC  81 mg Oral Daily  . atorvastatin  40 mg Oral QPM  . bumetanide  2 mg Oral BID  . carvedilol  25 mg Oral BID  . Chlorhexidine Gluconate Cloth  6 each Topical Daily  . diltiazem  30 mg Oral BID  . heparin  5,000 Units Subcutaneous Q8H  . [START ON 07/25/2019] isosorbide mononitrate  60 mg Oral Daily  . pantoprazole  40 mg Oral Daily  . sodium chloride flush  3 mL Intravenous Q12H  . sodium chloride flush  3 mL Intravenous Q12H   Continuous Infusions: . sodium chloride    . sodium chloride    .  sodium chloride    . sodium chloride       LOS: 2 days    Time spent: 25 minutes. Greater than 50% of this time was spent in direct contact with the patient, coordinating care and discussing relevant ongoing clinical issues.    Lelon Frohlich, MD [facility] Pager [phone]  If 7PM-7AM, please contact night-coverage [email or web address] Password [email or web address] 07/24/2019, 12:26 PM

## 2019-07-24 NOTE — Progress Notes (Signed)
[facility] Progress Note   69 y.o. male HTN CASHD ESRD starting RRT 03/2019 now at [facility] with last full treatment yesterday. He is here for lower abdominal pain + epigastric but also has chest tightness when climbing stairs relieved with rest. Left heart cath revealed severe 2 vessel obstructive disease, 75% proximal LCx and 90% mid LCx but heavily calcified with recommendation for aggressive medical therapy. He states that his swelling in the lower extremities is much better since starting dialysis; he receives heparin on dialysis.   Assessment/ Plan:   1. ESRD - TTS regimen  - seen on  HD today Goal UF only 2 L; he's convinced he's going to have abd sxs 2 hrs in I spoke with staff and if BP starts dropping then turn off UF. 2K bath 113/57   2. HTN - on carvedilol, imdur, cardizem 3. Renal osteodystrophy - will check a phos for binder management 4. Anemia - @ goal 5. CASHD - s/p cath, decision to be made for medical management vs PCI.  6. COPD - Active smoker but states he has cut down. 7. Abd pain - wonder if he could have mesenteric ischemia; requested CTA abd to rule out given this has been a persistent problem and what causes the pt to shorten his hd treatments.   Subjective:   Denies any active CP or abd at this time No f/c/n/v/dyspnea   Objective:   BP (!) 113/57 (BP Location: Right Arm)   Pulse 60   Temp 97.8 F (36.6 C) (Oral)   Resp 20   Ht 6\' 4"  (1.93 m)   Wt 104.2 kg   SpO2 95%   BMI 27.96 kg/m   Intake/Output Summary (Last 24 hours) at 07/24/2019 1303 Last data filed at 07/24/2019 1030 Gross per 24 hour  Intake 126 ml  Output 775 ml  Net -649 ml   Weight change: -0.327 kg  Physical Exam: GEN: NAD, A&Ox3, NCAT HEENT: No conjunctival pallor, EOMI NECK: Supple, no thyromegaly LUNGS: CTA B/L no rales, rhonchi or wheezing CV: RRR, No M/R/G ABD: SNDNT +BS  EXT: No lower extremity edema ACCESS: RIJ TC, Lt BCF good  augmentation    Imaging: CARDIAC CATHETERIZATION  Result Date: 07/23/2019  Mid LM to Dist LM lesion is 25% stenosed.  Mid LAD lesion is 40% stenosed.  1st Diag-1 lesion is 80% stenosed.  1st Diag-2 lesion is 90% stenosed.  1st Diag-3 lesion is 90% stenosed.  Ost Cx to Prox Cx lesion is 75% stenosed.  Mid Cx lesion is 90% stenosed.  Prox RCA lesion is 35% stenosed.  Dist RCA lesion is 50% stenosed.  The left ventricular systolic function is normal.  LV end diastolic pressure is normal.  The left ventricular ejection fraction is 55-65% by visual estimate.  1. Severe 2 vessel obstructive CAD. The RCA and LAD disease is stable compared to 2003. There is progressive disease in a large diagonal branch and the LCx    - the diagonal is a large bifurcating vessel.  It has multiple severe stenoses in the main branch with severely calcified lesions    - 75% proximal LCx and 90% mid LCx. There is an acutely angulated take off from the left main and the vessel is severely calcified. 2. Normal LV function 3. Normal LVEDP Plan: discussed with Dr Burt Knack. The diagonal is poorly suited for PCI due to diffuse and heavily calcified disease. The LCx is complex and would require atherectomy. The acute angulation would make  it technically difficult. For now would recommend aggressive medical therapy.   CT Angio Chest/Abd/Pel for Dissection W and/or Wo Contrast  Result Date: 07/22/2019 CLINICAL DATA:  Chest pain and abdominal pain for 1 day EXAM: CT ANGIOGRAPHY CHEST, ABDOMEN AND PELVIS TECHNIQUE: Multidetector CT imaging through the chest, abdomen and pelvis was performed using the standard protocol during bolus administration of intravenous contrast. Multiplanar reconstructed images and MIPs were obtained and reviewed to evaluate the vascular anatomy. CONTRAST:  113mL OMNIPAQUE IOHEXOL 350 MG/ML SOLN COMPARISON:  05/12/2019 FINDINGS: CTA CHEST FINDINGS Cardiovascular: Thoracic aorta demonstrates atherosclerotic  calcifications without aneurysmal dilatation or dissection. No significant cardiac enlargement is seen. Heavy coronary calcifications are noted. The pulmonary artery shows a normal branching pattern without intraluminal filling defect to suggest pulmonary embolism. Right jugular dialysis catheter is noted in satisfactory position. Mediastinum/Nodes: Thoracic inlet is within normal limits. No hilar or mediastinal adenopathy is noted. The esophagus as visualized is within normal limits. Lungs/Pleura: Lungs are well aerated bilaterally. There is a subpleural nodule in the left upper lobe best seen on image number 63 of series 8. This measures approximately 12 mm and is stable from the prior exam. Stable smaller nodules are noted within the left lung is well. No new focal nodule is seen. Mild scarring is noted in the right base. Musculoskeletal: No chest wall abnormality. No acute or significant osseous findings. Old rib fractures are noted with healing bilaterally. Review of the MIP images confirms the above findings. CTA ABDOMEN AND PELVIS FINDINGS VASCULAR Aorta: Abdominal aorta demonstrates atherosclerotic calcifications and mild infrarenal dilatation to 3.7 cm. This is stable from prior exam. No dissection is identified. No new aneurysmal dilatation is seen. Celiac: Calcific changes are noted at the origin although no focal hemodynamically significant stenosis is noted. SMA: Scattered calcified plaque is noted without focal stenosis. Renals: Single renal arteries are identified bilaterally with evidence of atherosclerotic plaque. The overall appearance is stable from the prior exam. IMA: Patent without evidence of aneurysm, dissection, vasculitis or significant stenosis. 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 Veins: No specific venous abnormality is noted Review of the MIP images confirms the above findings. NON-VASCULAR Hepatobiliary: No focal liver  abnormality is seen. Status post cholecystectomy. No biliary dilatation. Pancreas: Unremarkable. No pancreatic ductal dilatation or surrounding inflammatory changes. Spleen: Normal in size without focal abnormality. Adrenals/Urinary Tract: Adrenal glands are within normal limits bilaterally. Kidneys demonstrate a normal enhancement pattern bilaterally. Exophytic lesion is noted from the upper pole of the right kidney stable in appearance from the prior exam. No hydronephrosis is seen. The bladder is decompressed. Stomach/Bowel: The appendix has been surgically removed. No obstructive or inflammatory changes of the large or small bowel are seen. The stomach is within normal limits. Lymphatic: No sizable lymphadenopathy is noted. Reproductive: Prostate is unremarkable. Other: No abdominal wall hernia or abnormality. No abdominopelvic ascites. Musculoskeletal: Changes of prior fusion at L4-5 and L5-S1. No acute bony abnormality is seen. Review of the MIP images confirms the above findings. IMPRESSION: CTA of the chest: Atherosclerotic changes of the aorta without aneurysmal dilatation or dissection. No evidence of pulmonary emboli. Scattered bilateral pulmonary nodules stable in appearance from the prior exam in dating back to 2019. Given their stability they are felt to be benign in etiology. 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 Mild stable dilatation of the right common iliac artery. Stable exophytic lesion in the upper pole  of the right kidney. No other focal abnormality is noted. Electronically Signed   By: Inez Catalina M.D.   On: 07/22/2019 20:19   CT Angio Abd/Pel w/ and/or w/o  Result Date: 07/24/2019 CLINICAL DATA:  Severe abdominal pain 2 hours into dialysis. Evaluate for mesenteric ischemia. EXAM: CT ANGIOGRAPHY ABDOMEN AND PELVIS WITH CONTRAST AND WITHOUT CONTRAST TECHNIQUE: Multidetector CT imaging of the abdomen and pelvis was performed using the  standard protocol during bolus administration of intravenous contrast. Multiplanar reconstructed images and MIPs were obtained and reviewed to evaluate the vascular anatomy. CONTRAST:  168mL OMNIPAQUE IOHEXOL 350 MG/ML SOLN COMPARISON:  07/22/2019 FINDINGS: VASCULAR Aorta: Atherosclerotic calcifications in the descending mild thoracic aorta. Atherosclerotic calcifications and plaque involving the abdominal aorta. Infrarenal abdominal aortic aneurysm measures up to 3.7 cm and stable. Negative for aortic dissection. Celiac: Atherosclerotic plaque at the origin of the celiac artery trunk without significant stenosis. Left gastric artery, splenic artery and common hepatic artery are patent. SMA: Calcified plaque at the origin of the SMA with less than 50% stenosis. Diffuse atherosclerotic plaque throughout the SMA. Main branches of the SMA are patent. Renals: Origin of the left renal artery is widely patent. Extensive calcified plaque and at least moderate stenosis in the mid left renal artery. Calcified plaque at the origin of the right renal artery with mild stenosis. There is probably hemodynamically significant stenosis in the right renal artery 1.3 cm from the origin. 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 The distal right common iliac artery measures up to 1.9 cm. Right iliac arteries are patent. The right external iliac artery is heavily calcified and suspect at least mild to moderate stenosis in the distal aspect of the right external iliac artery. Ectasia of the left common iliac artery measuring up to 1.6 cm with diffuse atherosclerotic calcifications. Left iliac arteries are patent. Proximal Outflow: Proximal right femoral arteries are heavily calcified but patent. There is a hematoma anterior to the right common femoral artery but no clear evidence for a active bleeding or pseudoaneurysm. Proximal left femoral arteries are patent. Veins: Iliac veins and IVC  are patent. Renal veins are patent. Portal venous system is patent. Review of the MIP images confirms the above findings. NON-VASCULAR Lower chest: New posterior dependent densities in both lower lobes, right side greater than left. Findings likely represent significant volume loss and atelectasis. Cardiac pacer wires. Hepatobiliary: Cholecystectomy. Normal appearance of the liver. No suspicious lesion. No biliary dilatation. Pancreas: Unremarkable. No pancreatic ductal dilatation or surrounding inflammatory changes. Spleen: Normal in size without focal abnormality. Adrenals/Urinary Tract: Normal appearance of the right adrenal gland. Mild fullness of the left adrenal gland. Again noted is a slightly hyperdense exophytic structure along the upper pole of the right kidney that measures 1.4 cm and measured 0.9 cm on MRI from 10/10/2016. Hounsfield units are roughly 50 and does not significant change between the arterial and venous phase of imaging. No hydronephrosis. Normal appearance of the urinary bladder. Stomach/Bowel: Fat attenuating lesion in the mid transverse colon measures 1.3 cm on sequence 11, image 46. This probably represents a lipoma. Additional lipoma at the hepatic flexure measuring 1.2 cm image 38, sequence 11. Again noted is a metallic clip along the lateral wall the right colon. Surgical bowel anastomosis involving the small bowel in right abdomen. Normal appearance of the stomach and duodenum. No focal bowel inflammation or obstruction. Lymphatic: No abdominopelvic lymphadenopathy. Reproductive: Enlarged prostate measuring 5.7 cm in the transverse dimension.  Other: Large amount of high-density fluid in the right groin and proximal right thigh. Findings are suggestive for a hematoma compatible with recent catheterization in this area. Hematoma measures roughly 3.5 cm in the AP dimension. The entire extent of this hematoma is not visualized. Negative for intra-abdominal retroperitoneal hematoma.  Negative for ascites. Negative for free air. Musculoskeletal: Old posterior right rib fractures. Interbody fusion at L4-L5 and L5-S1. IMPRESSION: VASCULAR 1. Abdominal aortic aneurysm measuring 3.7 cm. Recommend followup by ultrasound in 2 years. This recommendation follows ACR consensus guidelines: White Paper of the ACR Incidental Findings Committee II on Vascular Findings. J Am Coll Radiol 2013; 10:789-794. Aortic aneurysm NOS (ICD10-I71.9) 2. 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 3. Calcifications at the origin of the celiac trunk and SMA without significant stenosis. 4. Stenosis involving the bilateral renal arteries, right side greater than left. 5.  Aortic Atherosclerosis (ICD10-I70.0). NON-VASCULAR 1. Hematoma in the right groin compatible with recent catheterization in this area. No clear evidence for active bleeding or pseudoaneurysm. 2. New densities in the posterior lower lungs most compatible with atelectasis and volume loss. 3. No acute intra-abdominal abnormality. 4. Evidence for lipomas involving the colon. 5. Postsurgical bowel changes. No evidence for bowel inflammation or obstruction. 6. Slightly hyperdense exophytic structure involving the right kidney upper pole. This structure is indeterminate but favor hyperdense or proteinaceous cyst. Electronically Signed   By: Markus Daft M.D.   On: 07/24/2019 09:29    Labs: BMET Recent Labs  Lab 07/22/19 1343 07/23/19 0557 07/24/19 0527  NA 133* 133* 130*  K 4.3 5.0 5.1  CL 91* 94* 90*  CO2 28 26 25   GLUCOSE 115* 83 88  BUN 20 31* 46*  CREATININE 5.32* 6.77* 8.20*  CALCIUM 9.0 8.9 8.7*   CBC Recent Labs  Lab 07/22/19 1343 07/23/19 0557 07/24/19 0527  WBC 9.7 8.4 7.6  HGB 13.4 12.8* 11.4*  HCT 40.8 39.1 34.1*  MCV 104.6* 102.4* 103.0*  PLT 202 168 188    Medications:    . aspirin EC  81 mg Oral Daily  . atorvastatin  40 mg Oral QPM  . bumetanide  2 mg Oral BID  . carvedilol  25  mg Oral BID  . Chlorhexidine Gluconate Cloth  6 each Topical Daily  . diltiazem  30 mg Oral BID  . heparin  5,000 Units Subcutaneous Q8H  . [START ON 07/25/2019] isosorbide mononitrate  60 mg Oral Daily  . pantoprazole  40 mg Oral Daily  . sodium chloride flush  3 mL Intravenous Q12H  . sodium chloride flush  3 mL Intravenous Q12H      Otelia Santee, MD 07/24/2019, 1:03 PM

## 2019-07-24 NOTE — Progress Notes (Signed)
Progress Note  Patient Name: [name] Date of Encounter: 07/24/2019  Primary Cardiologist: Carlyle Dolly, MD   Subjective   No CP or dysypnea  Inpatient Medications    Scheduled Meds: . aspirin EC  81 mg Oral Daily  . atorvastatin  40 mg Oral QPM  . bumetanide  2 mg Oral BID  . carvedilol  25 mg Oral BID  . Chlorhexidine Gluconate Cloth  6 each Topical Daily  . diltiazem  30 mg Oral BID  . heparin  5,000 Units Subcutaneous Q8H  . isosorbide mononitrate  30 mg Oral Daily  . pantoprazole  40 mg Oral Daily  . sodium chloride flush  3 mL Intravenous Q12H  . sodium chloride flush  3 mL Intravenous Q12H   Continuous Infusions: . sodium chloride    . sodium chloride    . sodium chloride    . sodium chloride     PRN Meds: sodium chloride, sodium chloride, sodium chloride, sodium chloride, acetaminophen, albuterol, alteplase, heparin, lidocaine (PF), lidocaine-prilocaine, Melatonin, nitroGLYCERIN, ondansetron (ZOFRAN) IV, pentafluoroprop-tetrafluoroeth, polyethylene glycol, sodium chloride flush, sodium chloride flush   Vital Signs    Vitals:   07/23/19 2100 07/23/19 2200 07/24/19 0000 07/24/19 0600  BP: (!) 104/47 121/60 (!) 122/59 (!) 103/46  Pulse: 62 (!) 59 (!) 59 60  Resp: (!) 25 17 20 19   Temp: 97.6 F (36.4 C)   97.8 F (36.6 C)  TempSrc: Oral     SpO2: 98% 94% 95% 92%  Weight:    104 kg  Height:        Intake/Output Summary (Last 24 hours) at 07/24/2019 1120 Last data filed at 07/24/2019 1030 Gross per 24 hour  Intake 126 ml  Output 775 ml  Net -649 ml   Last 3 Weights 07/24/2019 07/23/2019 07/22/2019  Weight (lbs) 229 lb 4.5 oz 226 lb 9.6 oz 225 lb 9.6 oz  Weight (kg) 104 kg 102.785 kg 102.331 kg      Telemetry    Sinus with intermittent atrial pacing- Personally Reviewed  Physical Exam   GEN: No acute distress.   Neck: No JVD Cardiac: RRR Respiratory: Clear to auscultation bilaterally. GI: Soft, nontender, non-distended  MS: No edema; Right  groin with no hematoma and no bruit Neuro:  Nonfocal  Psych: Normal affect   Labs    High Sensitivity Troponin:   Recent Labs  Lab 07/22/19 1343 07/22/19 1548  TROPONINIHS 229* 209*      Chemistry Recent Labs  Lab 07/22/19 1343 07/23/19 0557 07/24/19 0527  NA 133* 133* 130*  K 4.3 5.0 5.1  CL 91* 94* 90*  CO2 28 26 25   GLUCOSE 115* 83 88  BUN 20 31* 46*  CREATININE 5.32* 6.77* 8.20*  CALCIUM 9.0 8.9 8.7*  PROT 8.0  --   --   ALBUMIN 3.6  --   --   AST 19  --   --   ALT 15  --   --   ALKPHOS 59  --   --   BILITOT 0.7  --   --   GFRNONAA 10* 8* 6*  GFRAA 12* 9* 7*  ANIONGAP 14 13 15      Hematology Recent Labs  Lab 07/22/19 1343 07/23/19 0557 07/24/19 0527  WBC 9.7 8.4 7.6  RBC 3.90* 3.82* 3.31*  HGB 13.4 12.8* 11.4*  HCT 40.8 39.1 34.1*  MCV 104.6* 102.4* 103.0*  MCH 34.4* 33.5 34.4*  MCHC 32.8 32.7 33.4  RDW 17.0* 16.8* 17.0*  PLT 202 168 188    Radiology    CARDIAC CATHETERIZATION  Result Date: 07/23/2019  Mid LM to Dist LM lesion is 25% stenosed.  Mid LAD lesion is 40% stenosed.  1st Diag-1 lesion is 80% stenosed.  1st Diag-2 lesion is 90% stenosed.  1st Diag-3 lesion is 90% stenosed.  Ost Cx to Prox Cx lesion is 75% stenosed.  Mid Cx lesion is 90% stenosed.  Prox RCA lesion is 35% stenosed.  Dist RCA lesion is 50% stenosed.  The left ventricular systolic function is normal.  LV end diastolic pressure is normal.  The left ventricular ejection fraction is 55-65% by visual estimate.  1. Severe 2 vessel obstructive CAD. The RCA and LAD disease is stable compared to 2003. There is progressive disease in a large diagonal branch and the LCx    - the diagonal is a large bifurcating vessel.  It has multiple severe stenoses in the main branch with severely calcified lesions    - 75% proximal LCx and 90% mid LCx. There is an acutely angulated take off from the left main and the vessel is severely calcified. 2. Normal LV function 3. Normal LVEDP Plan:  discussed with Dr Burt Knack. The diagonal is poorly suited for PCI due to diffuse and heavily calcified disease. The LCx is complex and would require atherectomy. The acute angulation would make it technically difficult. For now would recommend aggressive medical therapy.   CT Angio Chest/Abd/Pel for Dissection W and/or Wo Contrast  Result Date: 07/22/2019 CLINICAL DATA:  Chest pain and abdominal pain for 1 day EXAM: CT ANGIOGRAPHY CHEST, ABDOMEN AND PELVIS TECHNIQUE: Multidetector CT imaging through the chest, abdomen and pelvis was performed using the standard protocol during bolus administration of intravenous contrast. Multiplanar reconstructed images and MIPs were obtained and reviewed to evaluate the vascular anatomy. CONTRAST:  123mL OMNIPAQUE IOHEXOL 350 MG/ML SOLN COMPARISON:  05/12/2019 FINDINGS: CTA CHEST FINDINGS Cardiovascular: Thoracic aorta demonstrates atherosclerotic calcifications without aneurysmal dilatation or dissection. No significant cardiac enlargement is seen. Heavy coronary calcifications are noted. The pulmonary artery shows a normal branching pattern without intraluminal filling defect to suggest pulmonary embolism. Right jugular dialysis catheter is noted in satisfactory position. Mediastinum/Nodes: Thoracic inlet is within normal limits. No hilar or mediastinal adenopathy is noted. The esophagus as visualized is within normal limits. Lungs/Pleura: Lungs are well aerated bilaterally. There is a subpleural nodule in the left upper lobe best seen on image number 63 of series 8. This measures approximately 12 mm and is stable from the prior exam. Stable smaller nodules are noted within the left lung is well. No new focal nodule is seen. Mild scarring is noted in the right base. Musculoskeletal: No chest wall abnormality. No acute or significant osseous findings. Old rib fractures are noted with healing bilaterally. Review of the MIP images confirms the above findings. CTA ABDOMEN AND  PELVIS FINDINGS VASCULAR Aorta: Abdominal aorta demonstrates atherosclerotic calcifications and mild infrarenal dilatation to 3.7 cm. This is stable from prior exam. No dissection is identified. No new aneurysmal dilatation is seen. Celiac: Calcific changes are noted at the origin although no focal hemodynamically significant stenosis is noted. SMA: Scattered calcified plaque is noted without focal stenosis. Renals: Single renal arteries are identified bilaterally with evidence of atherosclerotic plaque. The overall appearance is stable from the prior exam. IMA: Patent without evidence of aneurysm, dissection, vasculitis or significant stenosis. 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 Veins: No specific venous abnormality is  noted Review of the MIP images confirms the above findings. NON-VASCULAR Hepatobiliary: No focal liver abnormality is seen. Status post cholecystectomy. No biliary dilatation. Pancreas: Unremarkable. No pancreatic ductal dilatation or surrounding inflammatory changes. Spleen: Normal in size without focal abnormality. Adrenals/Urinary Tract: Adrenal glands are within normal limits bilaterally. Kidneys demonstrate a normal enhancement pattern bilaterally. Exophytic lesion is noted from the upper pole of the right kidney stable in appearance from the prior exam. No hydronephrosis is seen. The bladder is decompressed. Stomach/Bowel: The appendix has been surgically removed. No obstructive or inflammatory changes of the large or small bowel are seen. The stomach is within normal limits. Lymphatic: No sizable lymphadenopathy is noted. Reproductive: Prostate is unremarkable. Other: No abdominal wall hernia or abnormality. No abdominopelvic ascites. Musculoskeletal: Changes of prior fusion at L4-5 and L5-S1. No acute bony abnormality is seen. Review of the MIP images confirms the above findings. IMPRESSION: CTA of the chest: Atherosclerotic changes  of the aorta without aneurysmal dilatation or dissection. No evidence of pulmonary emboli. Scattered bilateral pulmonary nodules stable in appearance from the prior exam in dating back to 2019. Given their stability they are felt to be benign in etiology. 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 Mild stable dilatation of the right common iliac artery. Stable exophytic lesion in the upper pole of the right kidney. No other focal abnormality is noted. Electronically Signed   By: Inez Catalina M.D.   On: 07/22/2019 20:19   CT Angio Abd/Pel w/ and/or w/o  Result Date: 07/24/2019 CLINICAL DATA:  Severe abdominal pain 2 hours into dialysis. Evaluate for mesenteric ischemia. EXAM: CT ANGIOGRAPHY ABDOMEN AND PELVIS WITH CONTRAST AND WITHOUT CONTRAST TECHNIQUE: Multidetector CT imaging of the abdomen and pelvis was performed using the standard protocol during bolus administration of intravenous contrast. Multiplanar reconstructed images and MIPs were obtained and reviewed to evaluate the vascular anatomy. CONTRAST:  148mL OMNIPAQUE IOHEXOL 350 MG/ML SOLN COMPARISON:  07/22/2019 FINDINGS: VASCULAR Aorta: Atherosclerotic calcifications in the descending mild thoracic aorta. Atherosclerotic calcifications and plaque involving the abdominal aorta. Infrarenal abdominal aortic aneurysm measures up to 3.7 cm and stable. Negative for aortic dissection. Celiac: Atherosclerotic plaque at the origin of the celiac artery trunk without significant stenosis. Left gastric artery, splenic artery and common hepatic artery are patent. SMA: Calcified plaque at the origin of the SMA with less than 50% stenosis. Diffuse atherosclerotic plaque throughout the SMA. Main branches of the SMA are patent. Renals: Origin of the left renal artery is widely patent. Extensive calcified plaque and at least moderate stenosis in the mid left renal artery. Calcified plaque at the origin of the right renal  artery with mild stenosis. There is probably hemodynamically significant stenosis in the right renal artery 1.3 cm from the origin. 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 The distal right common iliac artery measures up to 1.9 cm. Right iliac arteries are patent. The right external iliac artery is heavily calcified and suspect at least mild to moderate stenosis in the distal aspect of the right external iliac artery. Ectasia of the left common iliac artery measuring up to 1.6 cm with diffuse atherosclerotic calcifications. Left iliac arteries are patent. Proximal Outflow: Proximal right femoral arteries are heavily calcified but patent. There is a hematoma anterior to the right common femoral artery but no clear evidence for a active bleeding or pseudoaneurysm. Proximal left femoral arteries are patent. Veins: Iliac veins and  IVC are patent. Renal veins are patent. Portal venous system is patent. Review of the MIP images confirms the above findings. NON-VASCULAR Lower chest: New posterior dependent densities in both lower lobes, right side greater than left. Findings likely represent significant volume loss and atelectasis. Cardiac pacer wires. Hepatobiliary: Cholecystectomy. Normal appearance of the liver. No suspicious lesion. No biliary dilatation. Pancreas: Unremarkable. No pancreatic ductal dilatation or surrounding inflammatory changes. Spleen: Normal in size without focal abnormality. Adrenals/Urinary Tract: Normal appearance of the right adrenal gland. Mild fullness of the left adrenal gland. Again noted is a slightly hyperdense exophytic structure along the upper pole of the right kidney that measures 1.4 cm and measured 0.9 cm on MRI from 10/10/2016. Hounsfield units are roughly 50 and does not significant change between the arterial and venous phase of imaging. No hydronephrosis. Normal appearance of the urinary bladder. Stomach/Bowel: Fat attenuating  lesion in the mid transverse colon measures 1.3 cm on sequence 11, image 46. This probably represents a lipoma. Additional lipoma at the hepatic flexure measuring 1.2 cm image 38, sequence 11. Again noted is a metallic clip along the lateral wall the right colon. Surgical bowel anastomosis involving the small bowel in right abdomen. Normal appearance of the stomach and duodenum. No focal bowel inflammation or obstruction. Lymphatic: No abdominopelvic lymphadenopathy. Reproductive: Enlarged prostate measuring 5.7 cm in the transverse dimension. Other: Large amount of high-density fluid in the right groin and proximal right thigh. Findings are suggestive for a hematoma compatible with recent catheterization in this area. Hematoma measures roughly 3.5 cm in the AP dimension. The entire extent of this hematoma is not visualized. Negative for intra-abdominal retroperitoneal hematoma. Negative for ascites. Negative for free air. Musculoskeletal: Old posterior right rib fractures. Interbody fusion at L4-L5 and L5-S1. IMPRESSION: VASCULAR 1. Abdominal aortic aneurysm measuring 3.7 cm. Recommend followup by ultrasound in 2 years. This recommendation follows ACR consensus guidelines: White Paper of the ACR Incidental Findings Committee II on Vascular Findings. J Am Coll Radiol 2013; 10:789-794. Aortic aneurysm NOS (ICD10-I71.9) 2. 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 3. Calcifications at the origin of the celiac trunk and SMA without significant stenosis. 4. Stenosis involving the bilateral renal arteries, right side greater than left. 5.  Aortic Atherosclerosis (ICD10-I70.0). NON-VASCULAR 1. Hematoma in the right groin compatible with recent catheterization in this area. No clear evidence for active bleeding or pseudoaneurysm. 2. New densities in the posterior lower lungs most compatible with atelectasis and volume loss. 3. No acute intra-abdominal abnormality. 4. Evidence for  lipomas involving the colon. 5. Postsurgical bowel changes. No evidence for bowel inflammation or obstruction. 6. Slightly hyperdense exophytic structure involving the right kidney upper pole. This structure is indeterminate but favor hyperdense or proteinaceous cyst. Electronically Signed   By: Markus Daft M.D.   On: 07/24/2019 09:29    Patient Profile     69 y.o. male with past medical history of end-stage renal disease dialysis dependent, coronary artery disease, history of paroxysmal atrial fibrillation, hypertension, hyperlipidemia, peripheral vascular disease, prior pacemaker admitted with non-ST elevation myocardial infarction.  Cardiac catheterization yesterday showed severe two-vessel coronary disease.  The right coronary and LAD disease stable compared to 2003 but progression in large diagonal and circumflex.  These vessels were felt to be difficult to approach percutaneously and aggressive medical therapy recommended.  Assessment & Plan    1 Coronary artery disease-patient underwent cardiac catheterization yesterday.  He has significant coronary disease but felt difficult approach  percutaneously.  Medical therapy was recommended.  This will be difficult given that he is hemodialysis dependent and his symptoms typically occur post dialysis with low blood pressure.  We will continue with aspirin, statin, beta-blocker, Cardizem and isosorbide.  Increase isosorbide to 60 mg daily.  If he continues with symptoms will need to reassess.  2 end-stage renal disease-management per nephrology.  3 history of paroxysmal atrial fibrillation-patient remains in sinus rhythm.  Continue beta-blocker and Cardizem.  He apparently is felt not to be a candidate for anticoagulation given history of anemia and GI bleed.  4 hypertension-blood pressure is controlled.  Continue management as outlined above.  5 abdominal aortic aneurysm-we will need follow-up as an outpatient.  For questions or updates, please  contact [place] Please consult [email or web address] for contact info under        Signed, Kirk Ruths, MD  07/24/2019, 11:20 AM

## 2019-07-25 LAB — CBC
HCT: 35.7 % — ABNORMAL LOW (ref 39.0–52.0)
Hemoglobin: 11.9 g/dL — ABNORMAL LOW (ref 13.0–17.0)
MCH: 34.3 pg — ABNORMAL HIGH (ref 26.0–34.0)
MCHC: 33.3 g/dL (ref 30.0–36.0)
MCV: 102.9 fL — ABNORMAL HIGH (ref 80.0–100.0)
Platelets: 149 10*3/uL — ABNORMAL LOW (ref 150–400)
RBC: 3.47 MIL/uL — ABNORMAL LOW (ref 4.22–5.81)
RDW: 17.1 % — ABNORMAL HIGH (ref 11.5–15.5)
WBC: 8.2 10*3/uL (ref 4.0–10.5)
nRBC: 0 % (ref 0.0–0.2)

## 2019-07-25 MED ORDER — ISOSORBIDE MONONITRATE ER 60 MG PO TB24: 60.0000 mg | ORAL_TABLET | Freq: Every day | ORAL | 1 refills | Status: DC

## 2019-07-25 MED ORDER — ASPIRIN 81 MG PO TBEC: 81.0000 mg | DELAYED_RELEASE_TABLET | Freq: Every day | ORAL | Status: DC

## 2019-07-25 NOTE — Discharge Summary (Signed)
Physician Discharge Summary  [name] CHY:[id number] DOB: 06-22-1950 DOA: 07/22/2019  PCP: Manon Hilding, MD  Admit date: 07/22/2019 Discharge date: 07/25/2019  Time spent: 45 minutes  Recommendations for Outpatient Follow-up:  -To be discharged home today. -Advised to follow up with his cardiologist, Dr. [name] in 2 weeks. -Continue hemodialysis as scheduled; next on Tuesday.   Discharge Diagnoses:  Principal Problem:   NSTEMI (non-ST elevated myocardial infarction) ([place]) Active Problems:   Generalized anxiety disorder   COPD (chronic obstructive pulmonary disease) ([facility])   Benign essential HTN   CAD (coronary artery disease)   Atrial flutter ([facility])   Chronic diastolic CHF (congestive heart failure) ([place])   Discharge Condition: Stable and improved  Filed Weights   07/24/19 1205 07/24/19 1610 07/25/19 0552  Weight: 104.2 kg 102.5 kg 103.2 kg    History of present illness:  As per Dr. Shanon Brow on 3/4: [name] is a 69 y.o. male with medical history significant of end-stage renal disease on dialysis Tuesday Thursday Saturday, abdominal aortic aneurysm, a flutter, coronary artery disease status post stent to RCA 1999, congestive heart failure, COPD, multiple pulmonary nodules comes in with over 6 months of diffuse abdominal pain and chest pain that occurs only right after dialysis.  He says it lasts several hours usually goes away with rest.  It only occurs on the day that he is getting dialysis.  The pain is from the lower abdomen all the way up into the lower chest.  He gets nauseous with it and sometimes vomits.  He denies any fevers.  Denies any diarrhea.  Denies any lower extremity swelling or edema.  Denies any cough.  Patient found to have positive troponins here and cardiology is consulted recommending heparin drip and cardiac cath in the morning.  Currently is pain-free.  Hospital Course:   Non-ST elevated MI -Had cath 3/5 with significant CAD but felt difficult  approach to correct percutaneously. Medical Rx has been recommended. -ASA/BB/cardizem/Imdur at increased dose of 60. -LDL is 36, is on Lipitor 40 mg. -Appreciate cards following. -CTA: No evidence for mesenteric ischemia. -Since symptoms happen mainly after HD, cardiology believes increasing his dry weight to keep BP a little higher following HD.  End-stage renal disease on hemodialysis -He dialyzes in [place] on a Tuesday, Thursday, Saturday schedule. -For HD today. -Appreciate nephrology following this hospitalization.  Chronic diastolic heart failure -Is compensated at this time  Atrial flutter -Rate controlled on Coreg and Cardizem. -Does not appear to be on anticoagulation.  Hypertension -Well-controlled.  COPD -Compensated.  Procedures:  Cardiac cath this admission: showed significant CAD but difficult to approach percutaneously.   Consultations:  Cardiology  Nephrology  Discharge Instructions  Discharge Instructions    Diet - low sodium heart healthy   Complete by: As directed    Increase activity slowly   Complete by: As directed      Allergies as of 07/25/2019   No Known Allergies     Medication List    STOP taking these medications   doxycycline 100 MG tablet Commonly known as: VIBRA-TABS   hydrALAZINE 100 MG tablet Commonly known as: APRESOLINE   oxyCODONE-acetaminophen 5-325 MG tablet Commonly known as: Percocet   potassium chloride SA 20 MEQ tablet Commonly known as: KLOR-CON   sildenafil 20 MG tablet Commonly known as: REVATIO     TAKE these medications   acetaminophen 325 MG tablet Commonly known as: TYLENOL Take 975 mg by mouth every morning.  aspirin 81 MG EC tablet Take 1 tablet (81 mg total) by mouth daily.   atorvastatin 40 MG tablet Commonly known as: LIPITOR Take 1 tablet (40 mg total) by mouth every evening.   beclomethasone 80 MCG/ACT inhaler Commonly known as: QVAR Inhale 2 puffs into the lungs 2  (two) times daily as needed (shortness of breath).   bumetanide 2 MG tablet Commonly known as: BUMEX Take 2 mg by mouth 2 (two) times daily.   buPROPion 150 MG 12 hr tablet Commonly known as: WELLBUTRIN SR Take 150 mg by mouth 2 (two) times daily.   carvedilol 25 MG tablet Commonly known as: COREG TAKE ONE TABLET BY MOUTH TWICE DAILY. What changed: when to take this   diltiazem 30 MG tablet Commonly known as: CARDIZEM TAKE ONE TABLET BY MOUTH TWICE DAILY.   iron polysaccharides 150 MG capsule Commonly known as: NIFEREX Take 150 mg by mouth every morning.   isosorbide mononitrate 60 MG 24 hr tablet Commonly known as: IMDUR Take 1 tablet (60 mg total) by mouth daily.   Linzess 290 MCG Caps capsule Generic drug: linaclotide Take 290 mcg by mouth daily as needed (constipation).   Melatonin 10 MG Tabs Take 10 mg by mouth at bedtime as needed (sleep).   multivitamin with minerals Tabs tablet Take 1 tablet by mouth every morning.   omeprazole 20 MG capsule Commonly known as: PRILOSEC Take 20 mg by mouth every morning.   polyethylene glycol 17 g packet Commonly known as: MIRALAX / GLYCOLAX Take 17 g by mouth daily as needed for mild constipation. Mix in morning coffee   Sennosides 25 MG Tabs Take 50 mg by mouth at bedtime.   sildenafil 100 MG tablet Commonly known as: VIAGRA Take 1 tablet (100 mg total) by mouth as needed for erectile dysfunction. What changed: when to take this   silver sulfADIAZINE 1 % cream Commonly known as: SILVADENE Apply 1 application topically daily as needed (For wound care with dressing).   Ventolin HFA 108 (90 Base) MCG/ACT inhaler Generic drug: albuterol Inhale 2 puffs into the lungs every 6 (six) hours as needed for wheezing or shortness of breath.   Vitamin D3 50 MCG (2000 UT) Tabs Take 2,000 Units by mouth every morning.      No Known Allergies Follow-up Information    Sasser, Silvestre Moment, MD. Schedule an appointment as soon as  possible for a visit in 2 week(s).   Specialty: Family Medicine Contact information: [address] [address] [phone]        Arnoldo Lenis, MD .   Specialty: Cardiology Contact information: [address] [place] [address] [phone]            The results of significant diagnostics from this hospitalization (including imaging, microbiology, ancillary and laboratory) are listed below for reference.    Significant Diagnostic Studies: CARDIAC CATHETERIZATION  Result Date: 07/23/2019  Mid LM to Dist LM lesion is 25% stenosed.  Mid LAD lesion is 40% stenosed.  1st Diag-1 lesion is 80% stenosed.  1st Diag-2 lesion is 90% stenosed.  1st Diag-3 lesion is 90% stenosed.  Ost Cx to Prox Cx lesion is 75% stenosed.  Mid Cx lesion is 90% stenosed.  Prox RCA lesion is 35% stenosed.  Dist RCA lesion is 50% stenosed.  The left ventricular systolic function is normal.  LV end diastolic pressure is normal.  The left ventricular ejection fraction is 55-65% by visual estimate.  1. Severe 2 vessel  obstructive CAD. The RCA and LAD disease is stable compared to 2003. There is progressive disease in a large diagonal branch and the LCx    - the diagonal is a large bifurcating vessel.  It has multiple severe stenoses in the main branch with severely calcified lesions    - 75% proximal LCx and 90% mid LCx. There is an acutely angulated take off from the left main and the vessel is severely calcified. 2. Normal LV function 3. Normal LVEDP Plan: discussed with Dr Burt Knack. The diagonal is poorly suited for PCI due to diffuse and heavily calcified disease. The LCx is complex and would require atherectomy. The acute angulation would make it technically difficult. For now would recommend aggressive medical therapy.   US Carotid Duplex Bilateral  Result Date: 06/28/2019 CLINICAL DATA:  Bilateral carotid bruit. History of CAD, hypertension, hyperlipidemia, diabetes, smoking and  end-stage renal disease, currently receiving dialysis via left upper arm AV fistula. EXAM: BILATERAL CAROTID DUPLEX ULTRASOUND TECHNIQUE: Pearline Cables scale imaging, color Doppler and duplex ultrasound were performed of bilateral carotid and vertebral arteries in the neck. COMPARISON:  None. FINDINGS: Criteria: Quantification of carotid stenosis is based on velocity parameters that correlate the residual internal carotid diameter with NASCET-based stenosis levels, using the diameter of the distal internal carotid lumen as the denominator for stenosis measurement. The following velocity measurements were obtained: RIGHT ICA: 101/24 cm/sec CCA: 341/96 cm/sec SYSTOLIC ICA/CCA RATIO:  0.7 ECA: 156 cm/sec LEFT ICA: 138/23 cm/sec CCA: 22/29 cm/sec SYSTOLIC ICA/CCA RATIO:  1.5 ECA: 143 cm/sec RIGHT CAROTID ARTERY: There is a moderate to large amount of eccentric mixed echogenic plaque involving the mid (image 7) and distal (image 12) aspects of the right common carotid artery. There is a moderate amount of eccentric echogenic plaque within the right carotid bulb (image 17 and 18), not resulting in elevated peak systolic velocities within the interrogated course of the right internal carotid artery to suggest a hemodynamically significant stenosis. RIGHT VERTEBRAL ARTERY:  Antegrade flow LEFT CAROTID ARTERY: There is a moderate amount of eccentric echogenic plaque involving the mid (image 46 and distal (image 51) aspects of the left common carotid artery. There is a moderate amount of eccentric echogenic plaque within the left carotid bulb (image 56), extending to involve the origin and proximal aspects of the left internal carotid artery (image 66, which results in elevated peak systolic velocities within the proximal and mid aspects of the left internal carotid artery. Greatest acquired peak systolic velocity with the proximal left ICA measures 138 centimeters/second (image 68). LEFT VERTEBRAL ARTERY: Antegrade flow with  transient reversal flow, a nonspecific finding given the presence of the left upper extremity AV fistula. IMPRESSION: 1. Moderate amount of left-sided atherosclerotic plaque results in elevated peak systolic velocities within the left internal carotid artery compatible with the 50-69% luminal narrowing range. Further evaluation with CTA could be obtained as clinically indicated. 2. Moderate to large amount of right-sided atherosclerotic plaque, not definitely resulting in a hemodynamically significant stenosis. Electronically Signed   By: Sandi Mariscal M.D.   On: 06/28/2019 16:22   CT Angio Chest/Abd/Pel for Dissection W and/or Wo Contrast  Result Date: 07/22/2019 CLINICAL DATA:  Chest pain and abdominal pain for 1 day EXAM: CT ANGIOGRAPHY CHEST, ABDOMEN AND PELVIS TECHNIQUE: Multidetector CT imaging through the chest, abdomen and pelvis was performed using the standard protocol during bolus administration of intravenous contrast. Multiplanar reconstructed images and MIPs were obtained and reviewed to evaluate the vascular anatomy. CONTRAST:  135mL OMNIPAQUE  IOHEXOL 350 MG/ML SOLN COMPARISON:  05/12/2019 FINDINGS: CTA CHEST FINDINGS Cardiovascular: Thoracic aorta demonstrates atherosclerotic calcifications without aneurysmal dilatation or dissection. No significant cardiac enlargement is seen. Heavy coronary calcifications are noted. The pulmonary artery shows a normal branching pattern without intraluminal filling defect to suggest pulmonary embolism. Right jugular dialysis catheter is noted in satisfactory position. Mediastinum/Nodes: Thoracic inlet is within normal limits. No hilar or mediastinal adenopathy is noted. The esophagus as visualized is within normal limits. Lungs/Pleura: Lungs are well aerated bilaterally. There is a subpleural nodule in the left upper lobe best seen on image number 63 of series 8. This measures approximately 12 mm and is stable from the prior exam. Stable smaller nodules are noted  within the left lung is well. No new focal nodule is seen. Mild scarring is noted in the right base. Musculoskeletal: No chest wall abnormality. No acute or significant osseous findings. Old rib fractures are noted with healing bilaterally. Review of the MIP images confirms the above findings. CTA ABDOMEN AND PELVIS FINDINGS VASCULAR Aorta: Abdominal aorta demonstrates atherosclerotic calcifications and mild infrarenal dilatation to 3.7 cm. This is stable from prior exam. No dissection is identified. No new aneurysmal dilatation is seen. Celiac: Calcific changes are noted at the origin although no focal hemodynamically significant stenosis is noted. SMA: Scattered calcified plaque is noted without focal stenosis. Renals: Single renal arteries are identified bilaterally with evidence of atherosclerotic plaque. The overall appearance is stable from the prior exam. IMA: Patent without evidence of aneurysm, dissection, vasculitis or significant stenosis. 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 Veins: No specific venous abnormality is noted Review of the MIP images confirms the above findings. NON-VASCULAR Hepatobiliary: No focal liver abnormality is seen. Status post cholecystectomy. No biliary dilatation. Pancreas: Unremarkable. No pancreatic ductal dilatation or surrounding inflammatory changes. Spleen: Normal in size without focal abnormality. Adrenals/Urinary Tract: Adrenal glands are within normal limits bilaterally. Kidneys demonstrate a normal enhancement pattern bilaterally. Exophytic lesion is noted from the upper pole of the right kidney stable in appearance from the prior exam. No hydronephrosis is seen. The bladder is decompressed. Stomach/Bowel: The appendix has been surgically removed. No obstructive or inflammatory changes of the large or small bowel are seen. The stomach is within normal limits. Lymphatic: No sizable lymphadenopathy is noted.  Reproductive: Prostate is unremarkable. Other: No abdominal wall hernia or abnormality. No abdominopelvic ascites. Musculoskeletal: Changes of prior fusion at L4-5 and L5-S1. No acute bony abnormality is seen. Review of the MIP images confirms the above findings. IMPRESSION: CTA of the chest: Atherosclerotic changes of the aorta without aneurysmal dilatation or dissection. No evidence of pulmonary emboli. Scattered bilateral pulmonary nodules stable in appearance from the prior exam in dating back to 2019. Given their stability they are felt to be benign in etiology. 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 Mild stable dilatation of the right common iliac artery. Stable exophytic lesion in the upper pole of the right kidney. No other focal abnormality is noted. Electronically Signed   By: Inez Catalina M.D.   On: 07/22/2019 20:19   CT Angio Abd/Pel w/ and/or w/o  Result Date: 07/24/2019 CLINICAL DATA:  Severe abdominal pain 2 hours into dialysis. Evaluate for mesenteric ischemia. EXAM: CT ANGIOGRAPHY ABDOMEN AND PELVIS WITH CONTRAST AND WITHOUT CONTRAST TECHNIQUE: Multidetector CT imaging of the abdomen and pelvis was performed using the standard protocol during bolus administration of intravenous contrast. Multiplanar  reconstructed images and MIPs were obtained and reviewed to evaluate the vascular anatomy. CONTRAST:  183mL OMNIPAQUE IOHEXOL 350 MG/ML SOLN COMPARISON:  07/22/2019 FINDINGS: VASCULAR Aorta: Atherosclerotic calcifications in the descending mild thoracic aorta. Atherosclerotic calcifications and plaque involving the abdominal aorta. Infrarenal abdominal aortic aneurysm measures up to 3.7 cm and stable. Negative for aortic dissection. Celiac: Atherosclerotic plaque at the origin of the celiac artery trunk without significant stenosis. Left gastric artery, splenic artery and common hepatic artery are patent. SMA: Calcified plaque at the origin of the  SMA with less than 50% stenosis. Diffuse atherosclerotic plaque throughout the SMA. Main branches of the SMA are patent. Renals: Origin of the left renal artery is widely patent. Extensive calcified plaque and at least moderate stenosis in the mid left renal artery. Calcified plaque at the origin of the right renal artery with mild stenosis. There is probably hemodynamically significant stenosis in the right renal artery 1.3 cm from the origin. 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 The distal right common iliac artery measures up to 1.9 cm. Right iliac arteries are patent. The right external iliac artery is heavily calcified and suspect at least mild to moderate stenosis in the distal aspect of the right external iliac artery. Ectasia of the left common iliac artery measuring up to 1.6 cm with diffuse atherosclerotic calcifications. Left iliac arteries are patent. Proximal Outflow: Proximal right femoral arteries are heavily calcified but patent. There is a hematoma anterior to the right common femoral artery but no clear evidence for a active bleeding or pseudoaneurysm. Proximal left femoral arteries are patent. Veins: Iliac veins and IVC are patent. Renal veins are patent. Portal venous system is patent. Review of the MIP images confirms the above findings. NON-VASCULAR Lower chest: New posterior dependent densities in both lower lobes, right side greater than left. Findings likely represent significant volume loss and atelectasis. Cardiac pacer wires. Hepatobiliary: Cholecystectomy. Normal appearance of the liver. No suspicious lesion. No biliary dilatation. Pancreas: Unremarkable. No pancreatic ductal dilatation or surrounding inflammatory changes. Spleen: Normal in size without focal abnormality. Adrenals/Urinary Tract: Normal appearance of the right adrenal gland. Mild fullness of the left adrenal gland. Again noted is a slightly hyperdense exophytic structure  along the upper pole of the right kidney that measures 1.4 cm and measured 0.9 cm on MRI from 10/10/2016. Hounsfield units are roughly 50 and does not significant change between the arterial and venous phase of imaging. No hydronephrosis. Normal appearance of the urinary bladder. Stomach/Bowel: Fat attenuating lesion in the mid transverse colon measures 1.3 cm on sequence 11, image 46. This probably represents a lipoma. Additional lipoma at the hepatic flexure measuring 1.2 cm image 38, sequence 11. Again noted is a metallic clip along the lateral wall the right colon. Surgical bowel anastomosis involving the small bowel in right abdomen. Normal appearance of the stomach and duodenum. No focal bowel inflammation or obstruction. Lymphatic: No abdominopelvic lymphadenopathy. Reproductive: Enlarged prostate measuring 5.7 cm in the transverse dimension. Other: Large amount of high-density fluid in the right groin and proximal right thigh. Findings are suggestive for a hematoma compatible with recent catheterization in this area. Hematoma measures roughly 3.5 cm in the AP dimension. The entire extent of this hematoma is not visualized. Negative for intra-abdominal retroperitoneal hematoma. Negative for ascites. Negative for free air. Musculoskeletal: Old posterior right rib fractures. Interbody fusion at L4-L5 and L5-S1. IMPRESSION: VASCULAR 1. Abdominal aortic aneurysm measuring 3.7 cm. Recommend followup by ultrasound in  2 years. This recommendation follows ACR consensus guidelines: White Paper of the ACR Incidental Findings Committee II on Vascular Findings. J Am Coll Radiol 2013; 10:789-794. Aortic aneurysm NOS (ICD10-I71.9) 2. 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 3. Calcifications at the origin of the celiac trunk and SMA without significant stenosis. 4. Stenosis involving the bilateral renal arteries, right side greater than left. 5.  Aortic Atherosclerosis  (ICD10-I70.0). NON-VASCULAR 1. Hematoma in the right groin compatible with recent catheterization in this area. No clear evidence for active bleeding or pseudoaneurysm. 2. New densities in the posterior lower lungs most compatible with atelectasis and volume loss. 3. No acute intra-abdominal abnormality. 4. Evidence for lipomas involving the colon. 5. Postsurgical bowel changes. No evidence for bowel inflammation or obstruction. 6. Slightly hyperdense exophytic structure involving the right kidney upper pole. This structure is indeterminate but favor hyperdense or proteinaceous cyst. Electronically Signed   By: Markus Daft M.D.   On: 07/24/2019 09:29    Microbiology: Recent Results (from the past 240 hour(s))  SARS CORONAVIRUS 2 (TAT 6-24 HRS) Nasopharyngeal Nasopharyngeal Swab     Status: None   Collection Time: 07/22/19  8:53 PM   Specimen: Nasopharyngeal Swab  Result Value Ref Range Status   SARS Coronavirus 2 NEGATIVE NEGATIVE Final    Comment: (NOTE) SARS-CoV-2 target nucleic acids are NOT DETECTED. The SARS-CoV-2 RNA is generally detectable in upper and lower respiratory specimens during the acute phase of infection. Negative results do not preclude SARS-CoV-2 infection, do not rule out co-infections with other pathogens, and should not be used as the sole basis for treatment or other patient management decisions. Negative results must be combined with clinical observations, patient history, and epidemiological information. The expected result is Negative. Fact Sheet for Patients: SugarRoll.be Fact Sheet for Healthcare Providers: https://www.woods-mathews.com/ This test is not yet approved or cleared by the [place] FDA and  has been authorized for detection and/or diagnosis of SARS-CoV-2 by FDA under an Emergency Use Authorization (EUA). This EUA will remain  in effect (meaning this test can be used) for the duration of the COVID-19  declaration under Section 56 4(b)(1) of the Act, 21 U.S.C. section 360bbb-3(b)(1), unless the authorization is terminated or revoked sooner. Performed at [facility], [place] [address] [place], [place] [address]      Labs: Basic Metabolic Panel: Recent Labs  Lab 07/22/19 1343 07/23/19 0557 07/24/19 0527 07/24/19 1956  NA 133* 133* 130* 138  K 4.3 5.0 5.1 4.3  CL 91* 94* 90* 96*  CO2 28 26 25 29   GLUCOSE 115* 83 88 96  BUN 20 31* 46* 20  CREATININE 5.32* 6.77* 8.20* 4.91*  CALCIUM 9.0 8.9 8.7* 8.9  PHOS  --   --   --  4.5   Liver Function Tests: Recent Labs  Lab 07/22/19 1343 07/24/19 1956  AST 19  --   ALT 15  --   ALKPHOS 59  --   BILITOT 0.7  --   PROT 8.0  --   ALBUMIN 3.6 3.2*   Recent Labs  Lab 07/22/19 1343  LIPASE 38   No results for input(s): AMMONIA in the last 168 hours. CBC: Recent Labs  Lab 07/22/19 1343 07/23/19 0557 07/24/19 0527 07/24/19 1956 07/25/19 0328  WBC 9.7 8.4 7.6 7.8 8.2  HGB 13.4 12.8* 11.4* 11.8* 11.9*  HCT 40.8 39.1 34.1* 35.6* 35.7*  MCV 104.6* 102.4* 103.0* 102.6* 102.9*  PLT 202 168 188  144* 149*   Cardiac Enzymes: No results for input(s): CKTOTAL, CKMB, CKMBINDEX, TROPONINI in the last 168 hours. BNP: BNP (last 3 results) Recent Labs    04/10/19 1244  BNP 1,349.3*    ProBNP (last 3 results) No results for input(s): PROBNP in the last 8760 hours.  CBG: Recent Labs  Lab 07/23/19 1423  GLUCAP 65*       Signed:  [facility] Pager: [phone] 07/25/2019, 9:09 AM

## 2019-07-25 NOTE — Discharge Instructions (Signed)

## 2019-07-25 NOTE — Progress Notes (Signed)
Progress Note  Patient Name: [name] Date of Encounter: 07/25/2019  Primary Cardiologist: Carlyle Dolly, MD   Subjective   Pt denies CP or dyspnea; mild epigastric tightness with dialysis yesterday.  Inpatient Medications    Scheduled Meds: . aspirin EC  81 mg Oral Daily  . atorvastatin  40 mg Oral QPM  . bumetanide  2 mg Oral BID  . carvedilol  25 mg Oral BID  . Chlorhexidine Gluconate Cloth  6 each Topical Daily  . diltiazem  30 mg Oral BID  . heparin  5,000 Units Subcutaneous Q8H  . isosorbide mononitrate  60 mg Oral Daily  . pantoprazole  40 mg Oral Daily  . sodium chloride flush  3 mL Intravenous Q12H  . sodium chloride flush  3 mL Intravenous Q12H   Continuous Infusions: . sodium chloride    . sodium chloride     PRN Meds: sodium chloride, sodium chloride, acetaminophen, albuterol, Melatonin, nitroGLYCERIN, ondansetron (ZOFRAN) IV, polyethylene glycol, sodium chloride flush, sodium chloride flush   Vital Signs    Vitals:   07/24/19 2100 07/24/19 2217 07/25/19 0000 07/25/19 0552  BP:  (!) 141/62 (!) 130/48 (!) 141/66  Pulse:   70 77  Resp:   (!) 23 20  Temp: 98.4 F (36.9 C)  98.2 F (36.8 C) 98.2 F (36.8 C)  TempSrc: Oral  Oral Oral  SpO2:   92% 95%  Weight:    103.2 kg  Height:        Intake/Output Summary (Last 24 hours) at 07/25/2019 0725 Last data filed at 07/24/2019 2220 Gross per 24 hour  Intake 486 ml  Output 1390 ml  Net -904 ml   Last 3 Weights 07/25/2019 07/24/2019 07/24/2019  Weight (lbs) 227 lb 8.2 oz 225 lb 15.5 oz 229 lb 11.5 oz  Weight (kg) 103.2 kg 102.5 kg 104.2 kg      Telemetry    Sinus with intermittent atrial pacing- Personally Reviewed  Physical Exam   GEN: WD WN NAD  Neck: supple Cardiac: RRR, no murmur Respiratory: Cta GI: Soft, NT/ND MS: No edema Neuro:  No focal findings Psych: Normal affect   Labs    High Sensitivity Troponin:   Recent Labs  Lab 07/22/19 1343 07/22/19 1548  TROPONINIHS 229* 209*        Chemistry Recent Labs  Lab 07/22/19 1343 07/22/19 1343 07/23/19 0557 07/24/19 0527 07/24/19 1956  NA 133*   < > 133* 130* 138  K 4.3   < > 5.0 5.1 4.3  CL 91*   < > 94* 90* 96*  CO2 28   < > 26 25 29   GLUCOSE 115*   < > 83 88 96  BUN 20   < > 31* 46* 20  CREATININE 5.32*   < > 6.77* 8.20* 4.91*  CALCIUM 9.0   < > 8.9 8.7* 8.9  PROT 8.0  --   --   --   --   ALBUMIN 3.6  --   --   --  3.2*  AST 19  --   --   --   --   ALT 15  --   --   --   --   ALKPHOS 59  --   --   --   --   BILITOT 0.7  --   --   --   --   GFRNONAA 10*   < > 8* 6* 11*  GFRAA 12*   < > 9*  7* 13*  ANIONGAP 14   < > 13 15 13    < > = values in this interval not displayed.     Hematology Recent Labs  Lab 07/24/19 0527 07/24/19 1956 07/25/19 0328  WBC 7.6 7.8 8.2  RBC 3.31* 3.47* 3.47*  HGB 11.4* 11.8* 11.9*  HCT 34.1* 35.6* 35.7*  MCV 103.0* 102.6* 102.9*  MCH 34.4* 34.0 34.3*  MCHC 33.4 33.1 33.3  RDW 17.0* 17.1* 17.1*  PLT 188 144* 149*    Radiology    CARDIAC CATHETERIZATION  Result Date: 07/23/2019  Mid LM to Dist LM lesion is 25% stenosed.  Mid LAD lesion is 40% stenosed.  1st Diag-1 lesion is 80% stenosed.  1st Diag-2 lesion is 90% stenosed.  1st Diag-3 lesion is 90% stenosed.  Ost Cx to Prox Cx lesion is 75% stenosed.  Mid Cx lesion is 90% stenosed.  Prox RCA lesion is 35% stenosed.  Dist RCA lesion is 50% stenosed.  The left ventricular systolic function is normal.  LV end diastolic pressure is normal.  The left ventricular ejection fraction is 55-65% by visual estimate.  1. Severe 2 vessel obstructive CAD. The RCA and LAD disease is stable compared to 2003. There is progressive disease in a large diagonal branch and the LCx    - the diagonal is a large bifurcating vessel.  It has multiple severe stenoses in the main branch with severely calcified lesions    - 75% proximal LCx and 90% mid LCx. There is an acutely angulated take off from the left main and the vessel is severely  calcified. 2. Normal LV function 3. Normal LVEDP Plan: discussed with Dr Burt Knack. The diagonal is poorly suited for PCI due to diffuse and heavily calcified disease. The LCx is complex and would require atherectomy. The acute angulation would make it technically difficult. For now would recommend aggressive medical therapy.   CT Angio Abd/Pel w/ and/or w/o  Result Date: 07/24/2019 CLINICAL DATA:  Severe abdominal pain 2 hours into dialysis. Evaluate for mesenteric ischemia. EXAM: CT ANGIOGRAPHY ABDOMEN AND PELVIS WITH CONTRAST AND WITHOUT CONTRAST TECHNIQUE: Multidetector CT imaging of the abdomen and pelvis was performed using the standard protocol during bolus administration of intravenous contrast. Multiplanar reconstructed images and MIPs were obtained and reviewed to evaluate the vascular anatomy. CONTRAST:  135mL OMNIPAQUE IOHEXOL 350 MG/ML SOLN COMPARISON:  07/22/2019 FINDINGS: VASCULAR Aorta: Atherosclerotic calcifications in the descending mild thoracic aorta. Atherosclerotic calcifications and plaque involving the abdominal aorta. Infrarenal abdominal aortic aneurysm measures up to 3.7 cm and stable. Negative for aortic dissection. Celiac: Atherosclerotic plaque at the origin of the celiac artery trunk without significant stenosis. Left gastric artery, splenic artery and common hepatic artery are patent. SMA: Calcified plaque at the origin of the SMA with less than 50% stenosis. Diffuse atherosclerotic plaque throughout the SMA. Main branches of the SMA are patent. Renals: Origin of the left renal artery is widely patent. Extensive calcified plaque and at least moderate stenosis in the mid left renal artery. Calcified plaque at the origin of the right renal artery with mild stenosis. There is probably hemodynamically significant stenosis in the right renal artery 1.3 cm from the origin. 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 The distal right common iliac  artery measures up to 1.9 cm. Right iliac arteries are patent. The right external iliac artery is heavily calcified and suspect at least mild to moderate stenosis in the distal aspect of the right external  iliac artery. Ectasia of the left common iliac artery measuring up to 1.6 cm with diffuse atherosclerotic calcifications. Left iliac arteries are patent. Proximal Outflow: Proximal right femoral arteries are heavily calcified but patent. There is a hematoma anterior to the right common femoral artery but no clear evidence for a active bleeding or pseudoaneurysm. Proximal left femoral arteries are patent. Veins: Iliac veins and IVC are patent. Renal veins are patent. Portal venous system is patent. Review of the MIP images confirms the above findings. NON-VASCULAR Lower chest: New posterior dependent densities in both lower lobes, right side greater than left. Findings likely represent significant volume loss and atelectasis. Cardiac pacer wires. Hepatobiliary: Cholecystectomy. Normal appearance of the liver. No suspicious lesion. No biliary dilatation. Pancreas: Unremarkable. No pancreatic ductal dilatation or surrounding inflammatory changes. Spleen: Normal in size without focal abnormality. Adrenals/Urinary Tract: Normal appearance of the right adrenal gland. Mild fullness of the left adrenal gland. Again noted is a slightly hyperdense exophytic structure along the upper pole of the right kidney that measures 1.4 cm and measured 0.9 cm on MRI from 10/10/2016. Hounsfield units are roughly 50 and does not significant change between the arterial and venous phase of imaging. No hydronephrosis. Normal appearance of the urinary bladder. Stomach/Bowel: Fat attenuating lesion in the mid transverse colon measures 1.3 cm on sequence 11, image 46. This probably represents a lipoma. Additional lipoma at the hepatic flexure measuring 1.2 cm image 38, sequence 11. Again noted is a metallic clip along the lateral wall the  right colon. Surgical bowel anastomosis involving the small bowel in right abdomen. Normal appearance of the stomach and duodenum. No focal bowel inflammation or obstruction. Lymphatic: No abdominopelvic lymphadenopathy. Reproductive: Enlarged prostate measuring 5.7 cm in the transverse dimension. Other: Large amount of high-density fluid in the right groin and proximal right thigh. Findings are suggestive for a hematoma compatible with recent catheterization in this area. Hematoma measures roughly 3.5 cm in the AP dimension. The entire extent of this hematoma is not visualized. Negative for intra-abdominal retroperitoneal hematoma. Negative for ascites. Negative for free air. Musculoskeletal: Old posterior right rib fractures. Interbody fusion at L4-L5 and L5-S1. IMPRESSION: VASCULAR 1. Abdominal aortic aneurysm measuring 3.7 cm. Recommend followup by ultrasound in 2 years. This recommendation follows ACR consensus guidelines: White Paper of the ACR Incidental Findings Committee II on Vascular Findings. J Am Coll Radiol 2013; 10:789-794. Aortic aneurysm NOS (ICD10-I71.9) 2. 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 3. Calcifications at the origin of the celiac trunk and SMA without significant stenosis. 4. Stenosis involving the bilateral renal arteries, right side greater than left. 5.  Aortic Atherosclerosis (ICD10-I70.0). NON-VASCULAR 1. Hematoma in the right groin compatible with recent catheterization in this area. No clear evidence for active bleeding or pseudoaneurysm. 2. New densities in the posterior lower lungs most compatible with atelectasis and volume loss. 3. No acute intra-abdominal abnormality. 4. Evidence for lipomas involving the colon. 5. Postsurgical bowel changes. No evidence for bowel inflammation or obstruction. 6. Slightly hyperdense exophytic structure involving the right kidney upper pole. This structure is indeterminate but favor hyperdense or  proteinaceous cyst. Electronically Signed   By: Markus Daft M.D.   On: 07/24/2019 09:29    Patient Profile     69 y.o. male with past medical history of end-stage renal disease dialysis dependent, coronary artery disease, history of paroxysmal atrial fibrillation, hypertension, hyperlipidemia, peripheral vascular disease, prior pacemaker admitted with non-ST elevation myocardial infarction.  Cardiac catheterization  yesterday showed severe two-vessel coronary disease.  The right coronary and LAD disease stable compared to 2003 but progression in large diagonal and circumflex.  These vessels were felt to be difficult to approach percutaneously and aggressive medical therapy recommended.  Assessment & Plan    1 Coronary artery disease-as outlined previously cardiac catheterization this admission showed significant coronary artery disease but difficult to approach percutaneously.  Medical therapy has been recommended.  Continue aspirin, beta-blocker, Cardizem, isosorbide and statin.  He only develops symptoms postdialysis when his blood pressure is low.  Best option would likely be to increase dry weight if possible to keep blood pressure higher following dialysis.  If he has symptoms despite the above measures we will need to review with interventionalists to see if there are options for revascularization.    2 end-stage renal disease-management per nephrology.  Likely needs to increase dry weight to allow blood pressure to run higher postdialysis as outlined above.  3 history of paroxysmal atrial fibrillation-patient remains in sinus rhythm.  Continue beta-blocker and Cardizem.  He apparently is felt not to be a candidate for anticoagulation given history of anemia and GI bleed.  4 hypertension-blood pressure is controlled.  Continue management as outlined above.  5 abdominal aortic aneurysm-we will need follow-up as an outpatient.  Patient can be discharged from a cardiac standpoint on present  medications.  He should follow-up with [facility] 1 to 2 weeks and Dr. [name] in 6 weeks.  For questions or updates, please contact [facility] Please consult [email or web address] for contact info under        Signed, Kirk Ruths, MD  07/25/2019, 7:25 AM

## 2019-07-25 NOTE — Progress Notes (Signed)
[name] Progress Note   69 y.o.maleHTN CASHD ESRD starting RRT 03/2019 now at [facility] with last full treatment yesterday. He is here for lower abdominal pain + epigastric but also has chest tightness when climbing stairs relieved with rest. Left heart cath revealed severe 2 vessel obstructive disease, 75% proximal LCx and 90% mid LCx but heavily calcified with recommendation for aggressive medical therapy. He states that his swelling in the lower extremities is much better since starting dialysis; he receives heparin on dialysis.   Assessment/ Plan:   1. ESRD - TTS regimen  - HD 3/6 with 1.14 L net UF and most importantly post dialysis weight was 102.5 kg w/ minimal symptoms.  EDW should be 102-102.5kg  He has f/u with [facility] next week and fistula should be able to be used week after. Augments beautifully with strong thrill.  2. HTN - on carvedilol, imdur, cardizem 3. Renal osteodystrophy -  phos 4.5 4. Anemia - @ goal 5. CASHD - s/p cath, decision  for medical management .  6. COPD - Active smoker but states he has cut down. 7. Abd pain - wonder if he could have mesenteric ischemia; requested CTA abd to rule out given this has been a persistent problem and what causes the pt to shorten his hd treatments.  Subjective:   Denies any active CP or abd at this time No f/c/n/v/dyspnea Much less discomfort yesterday with full treatment of HD.   Objective:   BP (!) 141/66 (BP Location: Right Arm)   Pulse 77   Temp 98.2 F (36.8 C) (Oral)   Resp 20   Ht 6\' 4"  (1.93 m)   Wt 103.2 kg   SpO2 95%   BMI 27.69 kg/m   Intake/Output Summary (Last 24 hours) at 07/25/2019 0921 Last data filed at 07/24/2019 2220 Gross per 24 hour  Intake 486 ml  Output 1390 ml  Net -904 ml   Weight change: 0.2 kg  Physical Exam: GEN: NAD, A&Ox3, NCAT HEENT: No conjunctival pallor, EOMI NECK: Supple, no thyromegaly LUNGS: CTA B/L no rales, rhonchi or wheezing CV: RRR, No  M/R/G ABD: SNDNT +BS  EXT: No lower extremity edema ACCESS: RIJ TC, Lt BCF good augmentation  Imaging: CARDIAC CATHETERIZATION  Result Date: 07/23/2019  Mid LM to Dist LM lesion is 25% stenosed.  Mid LAD lesion is 40% stenosed.  1st Diag-1 lesion is 80% stenosed.  1st Diag-2 lesion is 90% stenosed.  1st Diag-3 lesion is 90% stenosed.  Ost Cx to Prox Cx lesion is 75% stenosed.  Mid Cx lesion is 90% stenosed.  Prox RCA lesion is 35% stenosed.  Dist RCA lesion is 50% stenosed.  The left ventricular systolic function is normal.  LV end diastolic pressure is normal.  The left ventricular ejection fraction is 55-65% by visual estimate.  1. Severe 2 vessel obstructive CAD. The RCA and LAD disease is stable compared to 2003. There is progressive disease in a large diagonal branch and the LCx    - the diagonal is a large bifurcating vessel.  It has multiple severe stenoses in the main branch with severely calcified lesions    - 75% proximal LCx and 90% mid LCx. There is an acutely angulated take off from the left main and the vessel is severely calcified. 2. Normal LV function 3. Normal LVEDP Plan: discussed with Dr Burt Knack. The diagonal is poorly suited for PCI due to diffuse and heavily calcified disease. The LCx is complex and would require  atherectomy. The acute angulation would make it technically difficult. For now would recommend aggressive medical therapy.   CT Angio Abd/Pel w/ and/or w/o  Result Date: 07/24/2019 CLINICAL DATA:  Severe abdominal pain 2 hours into dialysis. Evaluate for mesenteric ischemia. EXAM: CT ANGIOGRAPHY ABDOMEN AND PELVIS WITH CONTRAST AND WITHOUT CONTRAST TECHNIQUE: Multidetector CT imaging of the abdomen and pelvis was performed using the standard protocol during bolus administration of intravenous contrast. Multiplanar reconstructed images and MIPs were obtained and reviewed to evaluate the vascular anatomy. CONTRAST:  1105mL OMNIPAQUE IOHEXOL 350 MG/ML SOLN COMPARISON:   07/22/2019 FINDINGS: VASCULAR Aorta: Atherosclerotic calcifications in the descending mild thoracic aorta. Atherosclerotic calcifications and plaque involving the abdominal aorta. Infrarenal abdominal aortic aneurysm measures up to 3.7 cm and stable. Negative for aortic dissection. Celiac: Atherosclerotic plaque at the origin of the celiac artery trunk without significant stenosis. Left gastric artery, splenic artery and common hepatic artery are patent. SMA: Calcified plaque at the origin of the SMA with less than 50% stenosis. Diffuse atherosclerotic plaque throughout the SMA. Main branches of the SMA are patent. Renals: Origin of the left renal artery is widely patent. Extensive calcified plaque and at least moderate stenosis in the mid left renal artery. Calcified plaque at the origin of the right renal artery with mild stenosis. There is probably hemodynamically significant stenosis in the right renal artery 1.3 cm from the origin. 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 The distal right common iliac artery measures up to 1.9 cm. Right iliac arteries are patent. The right external iliac artery is heavily calcified and suspect at least mild to moderate stenosis in the distal aspect of the right external iliac artery. Ectasia of the left common iliac artery measuring up to 1.6 cm with diffuse atherosclerotic calcifications. Left iliac arteries are patent. Proximal Outflow: Proximal right femoral arteries are heavily calcified but patent. There is a hematoma anterior to the right common femoral artery but no clear evidence for a active bleeding or pseudoaneurysm. Proximal left femoral arteries are patent. Veins: Iliac veins and IVC are patent. Renal veins are patent. Portal venous system is patent. Review of the MIP images confirms the above findings. NON-VASCULAR Lower chest: New posterior dependent densities in both lower lobes, right side greater than left.  Findings likely represent significant volume loss and atelectasis. Cardiac pacer wires. Hepatobiliary: Cholecystectomy. Normal appearance of the liver. No suspicious lesion. No biliary dilatation. Pancreas: Unremarkable. No pancreatic ductal dilatation or surrounding inflammatory changes. Spleen: Normal in size without focal abnormality. Adrenals/Urinary Tract: Normal appearance of the right adrenal gland. Mild fullness of the left adrenal gland. Again noted is a slightly hyperdense exophytic structure along the upper pole of the right kidney that measures 1.4 cm and measured 0.9 cm on MRI from 10/10/2016. Hounsfield units are roughly 50 and does not significant change between the arterial and venous phase of imaging. No hydronephrosis. Normal appearance of the urinary bladder. Stomach/Bowel: Fat attenuating lesion in the mid transverse colon measures 1.3 cm on sequence 11, image 46. This probably represents a lipoma. Additional lipoma at the hepatic flexure measuring 1.2 cm image 38, sequence 11. Again noted is a metallic clip along the lateral wall the right colon. Surgical bowel anastomosis involving the small bowel in right abdomen. Normal appearance of the stomach and duodenum. No focal bowel inflammation or obstruction. Lymphatic: No abdominopelvic lymphadenopathy. Reproductive: Enlarged prostate measuring 5.7 cm in the transverse dimension. Other: Large amount of high-density fluid in the  right groin and proximal right thigh. Findings are suggestive for a hematoma compatible with recent catheterization in this area. Hematoma measures roughly 3.5 cm in the AP dimension. The entire extent of this hematoma is not visualized. Negative for intra-abdominal retroperitoneal hematoma. Negative for ascites. Negative for free air. Musculoskeletal: Old posterior right rib fractures. Interbody fusion at L4-L5 and L5-S1. IMPRESSION: VASCULAR 1. Abdominal aortic aneurysm measuring 3.7 cm. Recommend followup by ultrasound in  2 years. This recommendation follows ACR consensus guidelines: White Paper of the ACR Incidental Findings Committee II on Vascular Findings. J Am Coll Radiol 2013; 10:789-794. Aortic aneurysm NOS (ICD10-I71.9) 2. 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 3. Calcifications at the origin of the celiac trunk and SMA without significant stenosis. 4. Stenosis involving the bilateral renal arteries, right side greater than left. 5.  Aortic Atherosclerosis (ICD10-I70.0). NON-VASCULAR 1. Hematoma in the right groin compatible with recent catheterization in this area. No clear evidence for active bleeding or pseudoaneurysm. 2. New densities in the posterior lower lungs most compatible with atelectasis and volume loss. 3. No acute intra-abdominal abnormality. 4. Evidence for lipomas involving the colon. 5. Postsurgical bowel changes. No evidence for bowel inflammation or obstruction. 6. Slightly hyperdense exophytic structure involving the right kidney upper pole. This structure is indeterminate but favor hyperdense or proteinaceous cyst. Electronically Signed   By: Markus Daft M.D.   On: 07/24/2019 09:29    Labs: BMET Recent Labs  Lab 07/22/19 1343 07/23/19 0557 07/24/19 0527 07/24/19 1956  NA 133* 133* 130* 138  K 4.3 5.0 5.1 4.3  CL 91* 94* 90* 96*  CO2 28 26 25 29   GLUCOSE 115* 83 88 96  BUN 20 31* 46* 20  CREATININE 5.32* 6.77* 8.20* 4.91*  CALCIUM 9.0 8.9 8.7* 8.9  PHOS  --   --   --  4.5   CBC Recent Labs  Lab 07/23/19 0557 07/24/19 0527 07/24/19 1956 07/25/19 0328  WBC 8.4 7.6 7.8 8.2  HGB 12.8* 11.4* 11.8* 11.9*  HCT 39.1 34.1* 35.6* 35.7*  MCV 102.4* 103.0* 102.6* 102.9*  PLT 168 188 144* 149*    Medications:    . aspirin EC  81 mg Oral Daily  . atorvastatin  40 mg Oral QPM  . bumetanide  2 mg Oral BID  . carvedilol  25 mg Oral BID  . Chlorhexidine Gluconate Cloth  6 each Topical Daily  . diltiazem  30 mg Oral BID  . heparin  5,000  Units Subcutaneous Q8H  . isosorbide mononitrate  60 mg Oral Daily  . pantoprazole  40 mg Oral Daily  . sodium chloride flush  3 mL Intravenous Q12H  . sodium chloride flush  3 mL Intravenous Q12H      Otelia Santee, MD 07/25/2019, 9:21 AM

## 2019-07-26 ENCOUNTER — Other Ambulatory Visit: Payer: Self-pay

## 2019-07-26 NOTE — Consult Note (Signed)
[facility] status: Pending  Call patient at cell phone number provided and HIPAA started but patient was driving and states he will be back home later this afternoon.  Will call at a later time.  Natividad Brood, RN BSN [facility] Liaison  [phone] business mobile phone Toll free office [phone]  Fax number: [phone] [email or web address]

## 2019-07-27 DIAGNOSIS — Z992 Dependence on renal dialysis: Secondary | ICD-10-CM | POA: Diagnosis not present

## 2019-07-27 DIAGNOSIS — D631 Anemia in chronic kidney disease: Secondary | ICD-10-CM | POA: Diagnosis not present

## 2019-07-27 DIAGNOSIS — N186 End stage renal disease: Secondary | ICD-10-CM | POA: Diagnosis not present

## 2019-07-27 DIAGNOSIS — N2581 Secondary hyperparathyroidism of renal origin: Secondary | ICD-10-CM | POA: Diagnosis not present

## 2019-07-27 DIAGNOSIS — D509 Iron deficiency anemia, unspecified: Secondary | ICD-10-CM | POA: Diagnosis not present

## 2019-07-27 DIAGNOSIS — R112 Nausea with vomiting, unspecified: Secondary | ICD-10-CM | POA: Diagnosis not present

## 2019-07-28 ENCOUNTER — Other Ambulatory Visit: Payer: Self-pay

## 2019-07-28 ENCOUNTER — Ambulatory Visit (HOSPITAL_COMMUNITY)
Admission: RE | Admit: 2019-07-28 | Discharge: 2019-07-28 | Disposition: A | Discharge status: Home / Self Care | Payer: Medicare Other | Source: Ambulatory Visit | Attending: Urology | Admitting: Urology

## 2019-07-28 DIAGNOSIS — N2889 Other specified disorders of kidney and ureter: Secondary | ICD-10-CM | POA: Insufficient documentation

## 2019-07-28 DIAGNOSIS — I714 Abdominal aortic aneurysm, without rupture: Secondary | ICD-10-CM | POA: Diagnosis not present

## 2019-07-28 MED ORDER — IOHEXOL 300 MG/ML  SOLN
100.0000 mL | Freq: Once | INTRAMUSCULAR | Status: AC | PRN
  Administered 2019-07-28: 100 mL via INTRAVENOUS

## 2019-07-28 NOTE — Consult Note (Signed)
Attempted another call and left a voicemail message for return call, change pending status from pending to not active, to update correct information.  Natividad Brood, RN BSN [facility] Liaison  [phone] business mobile phone Toll free office [phone]  Fax number: [phone] [email or web address] '

## 2019-07-29 ENCOUNTER — Telehealth (HOSPITAL_COMMUNITY): Payer: Self-pay

## 2019-07-29 DIAGNOSIS — N186 End stage renal disease: Secondary | ICD-10-CM | POA: Diagnosis not present

## 2019-07-29 DIAGNOSIS — D509 Iron deficiency anemia, unspecified: Secondary | ICD-10-CM | POA: Diagnosis not present

## 2019-07-29 DIAGNOSIS — D631 Anemia in chronic kidney disease: Secondary | ICD-10-CM | POA: Diagnosis not present

## 2019-07-29 DIAGNOSIS — R112 Nausea with vomiting, unspecified: Secondary | ICD-10-CM | POA: Diagnosis not present

## 2019-07-29 DIAGNOSIS — Z992 Dependence on renal dialysis: Secondary | ICD-10-CM | POA: Diagnosis not present

## 2019-07-29 DIAGNOSIS — N2581 Secondary hyperparathyroidism of renal origin: Secondary | ICD-10-CM | POA: Diagnosis not present

## 2019-07-29 NOTE — Telephone Encounter (Signed)

## 2019-07-30 ENCOUNTER — Other Ambulatory Visit (HOSPITAL_COMMUNITY)
Admission: RE | Admit: 2019-07-30 | Discharge: 2019-07-30 | Disposition: A | Discharge status: Home / Self Care | Payer: Medicare Other | Source: Ambulatory Visit | Attending: Vascular Surgery | Admitting: Vascular Surgery

## 2019-07-30 ENCOUNTER — Other Ambulatory Visit: Payer: Self-pay

## 2019-07-30 ENCOUNTER — Other Ambulatory Visit (HOSPITAL_COMMUNITY): Payer: Self-pay | Admitting: Vascular Surgery

## 2019-07-30 ENCOUNTER — Ambulatory Visit (HOSPITAL_COMMUNITY)
Admission: RE | Admit: 2019-07-30 | Discharge: 2019-07-30 | Disposition: A | Discharge status: Home / Self Care | Payer: Medicare Other | Source: Ambulatory Visit | Attending: Vascular Surgery | Admitting: Vascular Surgery

## 2019-07-30 ENCOUNTER — Encounter: Payer: Self-pay | Admitting: Vascular Surgery

## 2019-07-30 ENCOUNTER — Ambulatory Visit (INDEPENDENT_AMBULATORY_CARE_PROVIDER_SITE_OTHER): Payer: Medicare Other | Admitting: Vascular Surgery

## 2019-07-30 VITALS — BP 103/58 | HR 69 | Temp 97.8°F | Resp 20 | Ht 76.0 in | Wt 230.0 lb

## 2019-07-30 DIAGNOSIS — G8929 Other chronic pain: Secondary | ICD-10-CM

## 2019-07-30 DIAGNOSIS — K551 Chronic vascular disorders of intestine: Secondary | ICD-10-CM

## 2019-07-30 DIAGNOSIS — Z01818 Encounter for other preprocedural examination: Secondary | ICD-10-CM | POA: Diagnosis not present

## 2019-07-30 DIAGNOSIS — Z20822 Contact with and (suspected) exposure to covid-19: Secondary | ICD-10-CM | POA: Diagnosis not present

## 2019-07-30 DIAGNOSIS — R1013 Epigastric pain: Secondary | ICD-10-CM | POA: Diagnosis not present

## 2019-07-30 DIAGNOSIS — R109 Unspecified abdominal pain: Secondary | ICD-10-CM | POA: Insufficient documentation

## 2019-07-30 DIAGNOSIS — N186 End stage renal disease: Secondary | ICD-10-CM

## 2019-07-30 LAB — SARS CORONAVIRUS 2 (TAT 6-24 HRS): SARS Coronavirus 2: NEGATIVE

## 2019-07-30 NOTE — Progress Notes (Signed)
Patient ID: [name], male   DOB: Mar 23, 1951, 69 y.o.   MRN: [id number]  Reason for Consult: Follow-up   Referred by Manon Hilding, MD  Subjective:     HPI:  [name] is a 69 y.o. male end-stage renal disease currently on dialysis via catheter.  He has left arm AV fistula which is near maturing.  Patient has significant pain in his left neck carotid duplexes were performed.  This radiates up the left side of his head is usually related to dialysis.  He also has significant abdominal pain.  This began around November and is increasingly getting worse.  States that after dialysis he gets severe pain is midepigastric does not radiate.  He is associated with nausea frequent emesis.  Still having bowel function.  In general pain has become intolerable according to his wife patient is usually miserable after dialysis.  Patient does take aspirin and a statin.  Past Medical History:  Diagnosis Date  . AAA (abdominal aortic aneurysm) ([place]) 06/2016   Mild increase in size of 3.4 cm infrarenal abdominal aortic aneurysm  . Anemia   . Aortic atherosclerosis ([place])   . Asthma   . Atrial flutter ([place])   . AVF (arteriovenous fistula) ([facility])    Left forearm  . CAD (coronary artery disease)    a. s/p prior stenting of RCA in 1999 b. cath in 2003 showing moderate CAD c. NST in 2016 showing small area of ischemic and low-risk  . CHF (congestive heart failure) ([place])   . CKD (chronic kidney disease), stage V ([place])    kidney transplant evaluation  . COPD (chronic obstructive pulmonary disease) ([place])    with ongoing tobacco use and patient failed sham takes  . Diverticulosis 2018   Mild sigmoid colon diverticulosis.  Marland Kitchen DM2 (diabetes mellitus, type 2) ([place])   . Dyslipidemia   . Dysrhythmia 2018   atrial fibrillation  . Edema    chronic lower extremity secondary to right heart failure and chronic venous insufficiency  . Fatigue   . Fatty liver 2010   Mild  . Headache   . HTN  (hypertension)   . Morbid obesity ([place])   . Multiple pulmonary nodules 05/2018   Bilateral  . Pneumonia   . Presence of permanent cardiac pacemaker   . 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   Marland Kitchen Reflux esophagitis    hx  . Sleep apnea    PT STATES ONE TEST WAS POSITIVE AND ANOTHER TEST WAS NEGATIVE  . Two-vessel coronary artery disease    moderate. by cath in 2003. Status post stenting of the mdi RCA November 1999 normal left ventricular ejection fraction  . Wears glasses   . Wears hearing aid    B/L   Family History  Problem Relation Age of Onset  . Diabetes Mother   . Alcohol abuse Father    Past Surgical History:  Procedure Laterality Date  . A/V FISTULAGRAM Left 04/30/2019   Procedure: A/V FISTULAGRAM;  Surgeon: Elam Dutch, MD;  Location: [place] [facility];  Service: Cardiovascular;  Laterality: Left;  . ABDOMINAL SURGERY    . APPENDECTOMY    . AV FISTULA PLACEMENT Right 03/11/2018   Procedure: CREATION Brachiocephalic Fistula RIGHT ARM;  Surgeon: Rosetta Posner, MD;  Location[facility];  Service: Vascular;  Laterality: Right;  . AV FISTULA PLACEMENT Left 05/06/2019   Procedure: LEFT BRACHIOCEPHALIC ARTERIOVENOUS (AV) FISTULA CREATION;  Surgeon: Marty Heck, MD;  Location: [place];  Service: Vascular;  Laterality: Left;  . BACK SURGERY     x3; cages in patient's back since 2000  . BIOPSY  11/12/2018   Procedure: BIOPSY;  Surgeon: Laurence Spates, MD;  Location: [facility];  Service: Endoscopy;;  . CARDIAC CATHETERIZATION     stent  . CHOLECYSTECTOMY    . CLOSED REDUCTION SHOULDER DISLOCATION    . COLONOSCOPY W/ BIOPSIES AND POLYPECTOMY    . COLONOSCOPY WITH PROPOFOL N/A 11/12/2018   Procedure: COLONOSCOPY WITH PROPOFOL;  Surgeon: Laurence Spates, MD;  Location: [facility];  Service: Endoscopy;  Laterality: N/A;  . CORONARY ANGIOPLASTY    . diabetic ulcers    . DIAGNOSTIC LAPAROSCOPY    .  ESOPHAGOGASTRODUODENOSCOPY (EGD) WITH PROPOFOL N/A 11/12/2018   Procedure: ESOPHAGOGASTRODUODENOSCOPY (EGD) WITH PROPOFOL;  Surgeon: Laurence Spates, MD;  Location: [facility];  Service: Endoscopy;  Laterality: N/A;  . GROIN EXPLORATION    . HEMOSTASIS CLIP PLACEMENT  11/12/2018   Procedure: HEMOSTASIS CLIP PLACEMENT;  Surgeon: Laurence Spates, MD;  Location: [facility];  Service: Endoscopy;;  . INSERT / REPLACE / REMOVE PACEMAKER    . INSERTION OF ARTERIOVENOUS (AV) ARTEGRAFT ARM Left 03/18/2018   Procedure: INSERTION OF ARTERIOVENOUS (AV) FISTULA;  Surgeon: Rosetta Posner, MD;  Location: [place];  Service: Vascular;  Laterality: Left;  . IR FLUORO GUIDE CV LINE RIGHT  04/11/2019  . IR US GUIDE VASC ACCESS RIGHT  04/11/2019  . LEFT HEART CATH AND CORONARY ANGIOGRAPHY N/A 07/23/2019   Procedure: LEFT HEART CATH AND CORONARY ANGIOGRAPHY;  Surgeon: Martinique, Peter M, MD;  Location: [place] [facility];  Service: Cardiovascular;  Laterality: N/A;  . LIGATION ARTERIOVENOUS GORTEX GRAFT Right 03/18/2018   Procedure: LIGATION ARTERIOVENOUS FISTULA;  Surgeon: Rosetta Posner, MD;  Location: [place];  Service: Vascular;  Laterality: Right;  . OSTECTOMY Left 04/23/2017   Procedure: OSTECTOMY LEFT GREAT TOE;  Surgeon: [name], DPM;  Location: [facility];  Service: Podiatry;  Laterality: Left;  left great toe  . POLYPECTOMY  11/12/2018   Procedure: POLYPECTOMY;  Surgeon: Laurence Spates, MD;  Location: [facility];  Service: Endoscopy;;  . TUMOR REMOVAL     small intestine x3    Short Social History:  Social History   Tobacco Use  . Smoking status: Current Every Day Smoker    Packs/day: 0.50    Years: 40.00    Pack years: 20.00    Types: Cigarettes    Start date: 05/20/1968  . Smokeless tobacco: Never Used  Substance Use Topics  . Alcohol use: Not Currently    Alcohol/week: 0.0 standard drinks    Comment: rare    No Known Allergies  Current Outpatient Medications  Medication Sig Dispense Refill   . acetaminophen (TYLENOL) 325 MG tablet Take 975 mg by mouth every morning.     Marland Kitchen albuterol (VENTOLIN HFA) 108 (90 BASE) MCG/ACT inhaler Inhale 2 puffs into the lungs every 6 (six) hours as needed for wheezing or shortness of breath.     Marland Kitchen aspirin EC 81 MG EC tablet Take 1 tablet (81 mg total) by mouth daily.    Marland Kitchen atorvastatin (LIPITOR) 40 MG tablet Take 1 tablet (40 mg total) by mouth every evening. 90 tablet 2  . beclomethasone (QVAR) 80 MCG/ACT inhaler Inhale 2 puffs into the lungs 2 (two) times daily as needed (shortness of breath).    . bumetanide (BUMEX) 2 MG tablet Take 2 mg by mouth 2 (  two) times daily.    Marland Kitchen buPROPion (WELLBUTRIN SR) 150 MG 12 hr tablet Take 150 mg by mouth 2 (two) times daily.    . carvedilol (COREG) 25 MG tablet TAKE ONE TABLET BY MOUTH TWICE DAILY. (Patient taking differently: Take 25 mg by mouth 2 (two) times daily with a meal. ) 180 tablet 2  . Cholecalciferol (VITAMIN D3) 2000 units TABS Take 2,000 Units by mouth every morning.     . diltiazem (CARDIZEM) 30 MG tablet TAKE ONE TABLET BY MOUTH TWICE DAILY. (Patient taking differently: Take 30 mg by mouth 2 (two) times daily. ) 60 tablet 6  . iron polysaccharides (NIFEREX) 150 MG capsule Take 150 mg by mouth every morning.    . isosorbide mononitrate (IMDUR) 60 MG 24 hr tablet Take 1 tablet (60 mg total) by mouth daily. 30 tablet 1  . linaclotide (LINZESS) 290 MCG CAPS capsule Take 290 mcg by mouth daily as needed (constipation).    . Melatonin 10 MG TABS Take 10 mg by mouth at bedtime as needed (sleep).     . Multiple Vitamin (MULTIVITAMIN WITH MINERALS) TABS tablet Take 1 tablet by mouth every morning.     Marland Kitchen omeprazole (PRILOSEC) 20 MG capsule Take 20 mg by mouth every morning.     . polyethylene glycol (MIRALAX / GLYCOLAX) packet Take 17 g by mouth daily as needed for mild constipation. Mix in morning coffee    . Sennosides 25 MG TABS Take 50 mg by mouth at bedtime.    . sevelamer carbonate (RENVELA) 800 MG tablet  TAKE TWO (2) TABLETS BY MOUTH WITH MEALS AND ONE TABLET WITH SNACKS.    . sildenafil (VIAGRA) 100 MG tablet Take 1 tablet (100 mg total) by mouth as needed for erectile dysfunction. (Patient taking differently: Take 100 mg by mouth daily as needed for erectile dysfunction. ) 10 tablet 0  . silver sulfADIAZINE (SILVADENE) 1 % cream Apply 1 application topically daily as needed (For wound care with dressing).      No current facility-administered medications for this visit.    Review of Systems  Constitutional:  Constitutional negative. HENT: HENT negative.  Eyes: Eyes negative.  Respiratory: Respiratory negative.  GI: Positive for abdominal pain, nausea and vomiting.  Musculoskeletal: Musculoskeletal negative.  Skin: Skin negative.  Neurological: Positive for headaches.  Hematologic: Hematologic/lymphatic negative.  Psychiatric: Psychiatric negative.        Objective:  Objective   Vitals:   07/30/19 0958 07/30/19 1003  BP: 135/77 (!) 103/58  Pulse: 69   Resp: 20   Temp: 97.8 F (36.6 C)   SpO2: 98%   Weight: 230 lb (104.3 kg)   Height: 6\' 4"  (1.93 m)    Body mass index is 28 kg/m.  Physical Exam HENT:     Head: Normocephalic.     Nose:     Comments: Wearing mask Cardiovascular:     Rate and Rhythm: Normal rate.     Pulses:          Femoral pulses are 2+ on the right side and 2+ on the left side. Pulmonary:     Effort: Pulmonary effort is normal.  Abdominal:     General: Abdomen is flat.     Palpations: Abdomen is soft.  Musculoskeletal:     Cervical back: Neck supple. No tenderness.     Comments: Left upper arm with strong palpable thrill, vein is serpiginous  Skin:    General: Skin is warm and dry.  Capillary Refill: Capillary refill takes less than 2 seconds.  Neurological:     General: No focal deficit present.     Mental Status: He is alert.  Psychiatric:        Mood and Affect: Mood normal.        Behavior: Behavior normal.        Thought  Content: Thought content normal.        Judgment: Judgment normal.     Data: I have independently interpreted his mesenteric duplex.  SMA proximally has velocity 451 cm/s.  The aorta measures 3.5 cm greatest diameter.  Consistent with 79-99% stenosis of the SMA.       Assessment/Plan:     69 year old male with what appears to be chronic mesenteric ischemia exacerbated with dialysis.  I have recommended angiography with possible intervention.  We discussed the risk benefits alternatives he demonstrates good understanding.  He also understands that this may be diagnostic study only and that even intervention may not relieve all of his symptoms.  If we do intervene patient will require the addition of Plavix to his medication regimen.  We will get him scheduled on a nondialysis day in the very near future.   It is okay to begin using his fistula.  The fistula itself is quite serpiginous in the left upper extremity may require transposition to straighten the cannulation zone.     Waynetta Sandy MD [facility]

## 2019-07-31 DIAGNOSIS — N2581 Secondary hyperparathyroidism of renal origin: Secondary | ICD-10-CM | POA: Diagnosis not present

## 2019-07-31 DIAGNOSIS — N186 End stage renal disease: Secondary | ICD-10-CM | POA: Diagnosis not present

## 2019-07-31 DIAGNOSIS — D509 Iron deficiency anemia, unspecified: Secondary | ICD-10-CM | POA: Diagnosis not present

## 2019-07-31 DIAGNOSIS — R112 Nausea with vomiting, unspecified: Secondary | ICD-10-CM | POA: Diagnosis not present

## 2019-07-31 DIAGNOSIS — Z992 Dependence on renal dialysis: Secondary | ICD-10-CM | POA: Diagnosis not present

## 2019-07-31 DIAGNOSIS — D631 Anemia in chronic kidney disease: Secondary | ICD-10-CM | POA: Diagnosis not present

## 2019-08-02 ENCOUNTER — Ambulatory Visit (HOSPITAL_COMMUNITY)
Admission: RE | Admit: 2019-08-02 | Discharge: 2019-08-02 | Disposition: A | Discharge status: Home / Self Care | Payer: Medicare Other | Source: Ambulatory Visit | Attending: Vascular Surgery | Admitting: Vascular Surgery

## 2019-08-02 ENCOUNTER — Encounter (HOSPITAL_COMMUNITY)
Admission: RE | Disposition: A | Discharge status: Home / Self Care | Payer: Self-pay | Source: Ambulatory Visit | Attending: Vascular Surgery

## 2019-08-02 ENCOUNTER — Other Ambulatory Visit: Payer: Self-pay

## 2019-08-02 DIAGNOSIS — Z7982 Long term (current) use of aspirin: Secondary | ICD-10-CM | POA: Insufficient documentation

## 2019-08-02 DIAGNOSIS — E1151 Type 2 diabetes mellitus with diabetic peripheral angiopathy without gangrene: Secondary | ICD-10-CM | POA: Diagnosis not present

## 2019-08-02 DIAGNOSIS — F17210 Nicotine dependence, cigarettes, uncomplicated: Secondary | ICD-10-CM | POA: Diagnosis not present

## 2019-08-02 DIAGNOSIS — K219 Gastro-esophageal reflux disease without esophagitis: Secondary | ICD-10-CM | POA: Diagnosis not present

## 2019-08-02 DIAGNOSIS — Z79899 Other long term (current) drug therapy: Secondary | ICD-10-CM | POA: Diagnosis not present

## 2019-08-02 DIAGNOSIS — Z950 Presence of cardiac pacemaker: Secondary | ICD-10-CM | POA: Insufficient documentation

## 2019-08-02 DIAGNOSIS — I132 Hypertensive heart and chronic kidney disease with heart failure and with stage 5 chronic kidney disease, or end stage renal disease: Secondary | ICD-10-CM | POA: Diagnosis not present

## 2019-08-02 DIAGNOSIS — Z7984 Long term (current) use of oral hypoglycemic drugs: Secondary | ICD-10-CM | POA: Insufficient documentation

## 2019-08-02 DIAGNOSIS — I714 Abdominal aortic aneurysm, without rupture: Secondary | ICD-10-CM | POA: Diagnosis not present

## 2019-08-02 DIAGNOSIS — Z6828 Body mass index (BMI) 28.0-28.9, adult: Secondary | ICD-10-CM | POA: Diagnosis not present

## 2019-08-02 DIAGNOSIS — I700 Atherosclerosis of aorta: Secondary | ICD-10-CM | POA: Diagnosis not present

## 2019-08-02 DIAGNOSIS — Z955 Presence of coronary angioplasty implant and graft: Secondary | ICD-10-CM | POA: Insufficient documentation

## 2019-08-02 DIAGNOSIS — E785 Hyperlipidemia, unspecified: Secondary | ICD-10-CM | POA: Diagnosis not present

## 2019-08-02 DIAGNOSIS — J449 Chronic obstructive pulmonary disease, unspecified: Secondary | ICD-10-CM | POA: Diagnosis not present

## 2019-08-02 DIAGNOSIS — E1122 Type 2 diabetes mellitus with diabetic chronic kidney disease: Secondary | ICD-10-CM | POA: Insufficient documentation

## 2019-08-02 DIAGNOSIS — R234 Changes in skin texture: Secondary | ICD-10-CM | POA: Diagnosis not present

## 2019-08-02 DIAGNOSIS — R109 Unspecified abdominal pain: Secondary | ICD-10-CM | POA: Diagnosis not present

## 2019-08-02 DIAGNOSIS — I509 Heart failure, unspecified: Secondary | ICD-10-CM | POA: Diagnosis not present

## 2019-08-02 DIAGNOSIS — N186 End stage renal disease: Secondary | ICD-10-CM | POA: Diagnosis not present

## 2019-08-02 DIAGNOSIS — K551 Chronic vascular disorders of intestine: Secondary | ICD-10-CM | POA: Diagnosis not present

## 2019-08-02 DIAGNOSIS — I4892 Unspecified atrial flutter: Secondary | ICD-10-CM | POA: Insufficient documentation

## 2019-08-02 DIAGNOSIS — I2510 Atherosclerotic heart disease of native coronary artery without angina pectoris: Secondary | ICD-10-CM | POA: Diagnosis not present

## 2019-08-02 DIAGNOSIS — E1142 Type 2 diabetes mellitus with diabetic polyneuropathy: Secondary | ICD-10-CM | POA: Diagnosis not present

## 2019-08-02 DIAGNOSIS — Z992 Dependence on renal dialysis: Secondary | ICD-10-CM | POA: Diagnosis not present

## 2019-08-02 DIAGNOSIS — G4730 Sleep apnea, unspecified: Secondary | ICD-10-CM | POA: Diagnosis not present

## 2019-08-02 DIAGNOSIS — S91109D Unspecified open wound of unspecified toe(s) without damage to nail, subsequent encounter: Secondary | ICD-10-CM | POA: Diagnosis not present

## 2019-08-02 HISTORY — PX: VISCERAL ANGIOGRAPHY: CATH118276

## 2019-08-02 LAB — POCT I-STAT, CHEM 8
BUN: 50 mg/dL — ABNORMAL HIGH (ref 8–23)
Calcium, Ion: 1.12 mmol/L — ABNORMAL LOW (ref 1.15–1.40)
Chloride: 91 mmol/L — ABNORMAL LOW (ref 98–111)
Creatinine, Ser: 7.9 mg/dL — ABNORMAL HIGH (ref 0.61–1.24)
Glucose, Bld: 82 mg/dL (ref 70–99)
HCT: 40 % (ref 39.0–52.0)
Hemoglobin: 13.6 g/dL (ref 13.0–17.0)
Potassium: 4.5 mmol/L (ref 3.5–5.1)
Sodium: 132 mmol/L — ABNORMAL LOW (ref 135–145)
TCO2: 28 mmol/L (ref 22–32)

## 2019-08-02 SURGERY — VISCERAL ANGIOGRAPHY
Anesthesia: LOCAL

## 2019-08-02 MED ORDER — ONDANSETRON HCL 4 MG/2ML IJ SOLN: 4.0000 mg | Freq: Four times a day (QID) | INTRAMUSCULAR | Status: DC | PRN

## 2019-08-02 MED ORDER — SODIUM CHLORIDE 0.9% FLUSH: 3.0000 mL | Freq: Two times a day (BID) | INTRAVENOUS | Status: DC

## 2019-08-02 MED ORDER — IODIXANOL 320 MG/ML IV SOLN
INTRAVENOUS | Status: DC | PRN
  Administered 2019-08-02: 30 mL via INTRA_ARTERIAL

## 2019-08-02 MED ORDER — HYDROMORPHONE HCL 1 MG/ML IJ SOLN: 0.5000 mg | INTRAMUSCULAR | Status: DC | PRN

## 2019-08-02 MED ORDER — SODIUM CHLORIDE 0.9 % IV SOLN: INTRAVENOUS | Status: DC

## 2019-08-02 MED ORDER — ACETAMINOPHEN 325 MG PO TABS: 650.0000 mg | ORAL_TABLET | ORAL | Status: DC | PRN

## 2019-08-02 MED ORDER — OXYCODONE HCL 5 MG PO TABS: 5.0000 mg | ORAL_TABLET | ORAL | Status: DC | PRN

## 2019-08-02 MED ORDER — HYDRALAZINE HCL 20 MG/ML IJ SOLN: 5.0000 mg | INTRAMUSCULAR | Status: DC | PRN

## 2019-08-02 MED ORDER — SODIUM CHLORIDE 0.9% FLUSH: 3.0000 mL | INTRAVENOUS | Status: DC | PRN

## 2019-08-02 MED ORDER — LABETALOL HCL 5 MG/ML IV SOLN: 10.0000 mg | INTRAVENOUS | Status: DC | PRN

## 2019-08-02 MED ORDER — HEPARIN (PORCINE) IN NACL 1000-0.9 UT/500ML-% IV SOLN
INTRAVENOUS | Status: DC | PRN
  Administered 2019-08-02 (×2): 500 mL

## 2019-08-02 MED ORDER — LIDOCAINE HCL (PF) 1 % IJ SOLN
INTRAMUSCULAR | Status: DC | PRN
  Administered 2019-08-02: 20 mL via INTRADERMAL

## 2019-08-02 MED ORDER — SODIUM CHLORIDE 0.9 % IV SOLN: 250.0000 mL | INTRAVENOUS | Status: DC | PRN

## 2019-08-02 MED ORDER — LIDOCAINE HCL (PF) 1 % IJ SOLN
INTRAMUSCULAR | Status: AC
  Filled 2019-08-02: qty 30

## 2019-08-02 MED ORDER — HEPARIN (PORCINE) IN NACL 1000-0.9 UT/500ML-% IV SOLN
INTRAVENOUS | Status: AC
  Filled 2019-08-02: qty 1000

## 2019-08-02 SURGICAL SUPPLY — 8 items
CATH OMNI FLUSH 5F 65CM (CATHETERS) ×1 IMPLANT
KIT MICROPUNCTURE NIT STIFF (SHEATH) ×1 IMPLANT
KIT PV (KITS) ×2 IMPLANT
SHEATH PINNACLE 5F 10CM (SHEATH) ×1 IMPLANT
SYR MEDRAD MARK V 150ML (SYRINGE) ×1 IMPLANT
TRANSDUCER W/STOPCOCK (MISCELLANEOUS) ×2 IMPLANT
TRAY PV CATH (CUSTOM PROCEDURE TRAY) ×2 IMPLANT
WIRE BENTSON .035X145CM (WIRE) ×1 IMPLANT

## 2019-08-02 NOTE — H&P (Signed)
   History and Physical Update  The patient was interviewed and re-examined.  The patient's previous History and Physical has been reviewed and is unchanged from recent office visit. Plan for mesenteric angiography with possible intervention.   [name] C. Donzetta Matters, MD [facility] [place] Office: [phone] Pager: [phone]   08/02/2019, 11:27 AM

## 2019-08-02 NOTE — Op Note (Signed)
    Patient name: [name] MRN: [id number] DOB: Feb 22, 1951 Sex: male  08/02/2019 Pre-operative Diagnosis: esrd, abdominal pain Post-operative diagnosis:  Same Surgeon:  Eda Paschal. Donzetta Matters, MD Procedure Performed: 1.  US guided cannulation of right common femoral artery 2.  Aortogram 3.  Selection of superior mesenteric artery with selective angiography In the much contrast  Indications: 69 year old male with end-stage renal disease.  He is on dialysis Tuesdays Thursdays and Saturdays.  He has had severe abdominal pain on dialysis.  He is indicated for angiography with concern for SMA stenosis on recent mesenteric ultrasound.  Findings: Aorta has a small infrarenal aneurysm.  There is heavy calcification but no flow-limiting stenosis.  The SMA was cannulated again calcification was identified without flow-limiting stenosis.  No intervention was undertaken.   Procedure:  The patient was identified in the holding area and taken to room 8.  The patient was then placed supine on the table and prepped and draped in the usual sterile fashion.  A time out was called.  Ultrasound was used to evaluate the right common femoral artery.  This was heavily calcified.  Was cannulated retrosternal visualization a micropuncture needle followed the wire and sheath.  Images saved the permanent record.  Bentson wire was placed followed by 5 [place] sheath.  We placed an Omni catheter to the level of T12.  Lateral angiography was performed.  I do not see any stenosis there.  I cannulated the SMA directly.  I performed lateral and anterior angiography did not identify any stenoses there.  Catheter was removed over wire.  Sheath was removed pressure held until hemostasis obtained.  He tolerated procedure without any complication.   Contrast: 30cc  Shalika Arntz C. Donzetta Matters, MD [facility] Office: [phone] Pager: [phone]

## 2019-08-02 NOTE — Discharge Instructions (Signed)
Femoral Site Care This sheet gives you information about how to care for yourself after your procedure. Your health care provider may also give you more specific instructions. If you have problems or questions, contact your health care provider. What can I expect after the procedure? After the procedure, it is common to have:  Bruising that usually fades within 1-2 weeks.  Tenderness at the site. Follow these instructions at home: Wound care  Follow instructions from your health care provider about how to take care of your insertion site. Make sure you: ? Wash your hands with soap and water before you change your bandage (dressing). If soap and water are not available, use hand sanitizer. ? Change your dressing as told by your health care provider. ? Leave stitches (sutures), skin glue, or adhesive strips in place. These skin closures may need to stay in place for 2 weeks or longer. If adhesive strip edges start to loosen and curl up, you may trim the loose edges. Do not remove adhesive strips completely unless your health care provider tells you to do that.  Do not take baths, swim, or use a hot tub until your health care provider approves.  You may shower 24-48 hours after the procedure or as told by your health care provider. ? Gently wash the site with plain soap and water. ? Pat the area dry with a clean towel. ? Do not rub the site. This may cause bleeding.  Do not apply powder or lotion to the site. Keep the site clean and dry.  Check your femoral site every day for signs of infection. Check for: ? Redness, swelling, or pain. ? Fluid or blood. ? Warmth. ? Pus or a bad smell. Activity  For the first 2-3 days after your procedure, or as long as directed: ? Avoid climbing stairs as much as possible. ? Do not squat.  Do not lift anything that is heavier than 10 lb (4.5 kg), or the limit that you are told, until your health care provider says that it is safe.  Rest as  directed. ? Avoid sitting for a long time without moving. Get up to take short walks every 1-2 hours.  Do not drive for 24 hours if you were given a medicine to help you relax (sedative). General instructions  Take over-the-counter and prescription medicines only as told by your health care provider.  Keep all follow-up visits as told by your health care provider. This is important. Contact a health care provider if you have:  A fever or chills.  You have redness, swelling, or pain around your insertion site. Get help right away if:  The catheter insertion area swells very fast.  You pass out.  You suddenly start to sweat or your skin gets clammy.  The catheter insertion area is bleeding, and the bleeding does not stop when you hold steady pressure on the area.  The area near or just beyond the catheter insertion site becomes pale, cool, tingly, or numb. These symptoms may represent a serious problem that is an emergency. Do not wait to see if the symptoms will go away. Get medical help right away. Call your local emergency services (911 in the [place] Do not drive yourself to the hospital. Summary  After the procedure, it is common to have bruising that usually fades within 1-2 weeks.  Check your femoral site every day for signs of infection.  Do not lift anything that is heavier than 10 lb (4.5 kg), or the   limit that you are told, until your health care provider says that it is safe. This information is not intended to replace advice given to you by your health care provider. Make sure you discuss any questions you have with your health care provider. Document Revised: 05/19/2017 Document Reviewed: 05/19/2017 Elsevier Patient Education  2020 [employer]  

## 2019-08-03 DIAGNOSIS — N2581 Secondary hyperparathyroidism of renal origin: Secondary | ICD-10-CM | POA: Diagnosis not present

## 2019-08-03 DIAGNOSIS — Z992 Dependence on renal dialysis: Secondary | ICD-10-CM | POA: Diagnosis not present

## 2019-08-03 DIAGNOSIS — R112 Nausea with vomiting, unspecified: Secondary | ICD-10-CM | POA: Diagnosis not present

## 2019-08-03 DIAGNOSIS — D509 Iron deficiency anemia, unspecified: Secondary | ICD-10-CM | POA: Diagnosis not present

## 2019-08-03 DIAGNOSIS — N186 End stage renal disease: Secondary | ICD-10-CM | POA: Diagnosis not present

## 2019-08-03 DIAGNOSIS — R109 Unspecified abdominal pain: Secondary | ICD-10-CM | POA: Diagnosis not present

## 2019-08-03 DIAGNOSIS — D631 Anemia in chronic kidney disease: Secondary | ICD-10-CM | POA: Diagnosis not present

## 2019-08-04 ENCOUNTER — Telehealth: Payer: Self-pay | Admitting: *Deleted

## 2019-08-04 ENCOUNTER — Ambulatory Visit (INDEPENDENT_AMBULATORY_CARE_PROVIDER_SITE_OTHER): Payer: Medicare Other | Admitting: *Deleted

## 2019-08-04 ENCOUNTER — Ambulatory Visit: Payer: Medicare Other | Attending: Internal Medicine

## 2019-08-04 DIAGNOSIS — R001 Bradycardia, unspecified: Secondary | ICD-10-CM | POA: Diagnosis not present

## 2019-08-04 DIAGNOSIS — Z23 Encounter for immunization: Secondary | ICD-10-CM

## 2019-08-04 LAB — CUP PACEART REMOTE DEVICE CHECK
Battery Remaining Longevity: 168 mo
Battery Remaining Percentage: 100 %
Brady Statistic RA Percent Paced: 36 %
Brady Statistic RV Percent Paced: 7 %
Date Time Interrogation Session: 20210317044200
Implantable Lead Implant Date: 20190514
Implantable Lead Implant Date: 20190514
Implantable Lead Location: 753859
Implantable Lead Location: 753860
Implantable Lead Model: 7741
Implantable Lead Model: 7742
Implantable Lead Serial Number: 1015225
Implantable Lead Serial Number: 1020907
Implantable Pulse Generator Implant Date: 20190514
Lead Channel Impedance Value: 660 Ohm
Lead Channel Impedance Value: 977 Ohm
Lead Channel Setting Pacing Amplitude: 2 V
Lead Channel Setting Pacing Amplitude: 3.5 V
Lead Channel Setting Pacing Pulse Width: 1 ms
Lead Channel Setting Sensing Sensitivity: 1.5 mV
Pulse Gen Serial Number: 832937

## 2019-08-04 MED ORDER — NITROGLYCERIN 0.4 MG SL SUBL: 0.4000 mg | SUBLINGUAL_TABLET | SUBLINGUAL | 3 refills | Status: DC | PRN

## 2019-08-04 NOTE — Progress Notes (Signed)
PPM Remote  

## 2019-08-04 NOTE — Telephone Encounter (Signed)
Ok for SL NG. Headaches/dizziness probably coming from imdur, would lower to 30mg  daily. Reassess with his appt next week   Zandra Abts MD

## 2019-08-04 NOTE — Telephone Encounter (Signed)
Pt c/o chest pain on exersion/dizziness when he gets up/headaches - is requesting SL NTG for chest pain - is taking Imdur 60 mg daily - HR 60s - BP 130s/70s - is also on dialysis - recent cath and d/c on 3/15

## 2019-08-04 NOTE — Progress Notes (Signed)
Cardiology Office Note    Date:  08/09/2019   ID:  Sheron, Robin 06/09/1950, MRN [id number]  PCP:  Manon Hilding, MD  Cardiologist: Carlyle Dolly, MD EPS: None  Chief Complaint  Patient presents with  . Hospitalization Follow-up    History of Present Illness:  [name] is a 69 y.o. male with past medical history of end-stage renal disease dialysis dependent, coronary artery disease, history of paroxysmal atrial fibrillation not on anticoagulation because of history of anemia and GI bleed, hypertension, hyperlipidemia, peripheral vascular disease, prior pacemaker.  admitted with non-ST elevation myocardial infarction. Cardiac catheterization 07/23/2019 showed severe two-vessel coronary disease. The right coronary and LAD disease stable compared to 2003 but progression in large diagonal and circumflex. These vessels were felt to be difficult to approach percutaneously and aggressive medical therapy recommended.  Most his symptoms were after dialysis so it was suggested that they increase his dry weight if possible to keep blood pressure higher following dialysis.   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  There is heavy calcification in the SMA but not flow-limiting stenosis.  No intervention undertaken.  Patient comes in for f/u. Still having abdominal pain after dialysis. Can last for hours. Chest pain is a little bit better but still gets after dialysis if take too much fluid off. Called 08/04/19 with dizziness on imdur  So dose was lowered to 30 mg. Dizziness a little better on lower dose Imdur. Had 2 dialysis sessions that they took less off and he felt better. Saturday they took more off and he was uncomfortable for 1 1/2 hours. He continues to smoke 1 ppd. Thinks wellbutrin is helping and also using CBD gummies.  Past Medical History:  Diagnosis Date  . AAA (abdominal aortic aneurysm) ([place]) 06/2016   Mild  increase in size of 3.4 cm infrarenal abdominal aortic aneurysm  . Anemia   . Aortic atherosclerosis ([place])   . Asthma   . Atrial flutter ([place])   . AVF (arteriovenous fistula) (H[facility])    Left forearm  . CAD (coronary artery disease)    a. s/p prior stenting of RCA in 1999 b. cath in 2003 showing moderate CAD c. NST in 2016 showing small area of ischemic and low-risk  . CHF (congestive heart failure) ([place])   . CKD (chronic kidney disease), stage V ([place])    kidney transplant evaluation  . COPD (chronic obstructive pulmonary disease) ([place])    with ongoing tobacco use and patient failed sham takes  . Diverticulosis 2018   Mild sigmoid colon diverticulosis.  Marland Kitchen DM2 (diabetes mellitus, type 2) ([place])   . Dyslipidemia   . Dysrhythmia 2018   atrial fibrillation  . Edema    chronic lower extremity secondary to right heart failure and chronic venous insufficiency  . Fatigue   . Fatty liver 2010   Mild  . Headache   . HTN (hypertension)   . Morbid obesity ([place])   . Multiple pulmonary nodules 05/2018   Bilateral  . Pneumonia   . Presence of permanent cardiac pacemaker   . 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   Marland Kitchen Reflux esophagitis    hx  . Sleep apnea    PT STATES ONE TEST WAS POSITIVE AND ANOTHER TEST WAS NEGATIVE  . Two-vessel coronary artery disease    moderate. by cath in 2003. Status post stenting of the mdi  RCA November 1999 normal left ventricular ejection fraction  . Wears glasses   . Wears hearing aid    B/L    Past Surgical History:  Procedure Laterality Date  . A/V FISTULAGRAM Left 04/30/2019   Procedure: A/V FISTULAGRAM;  Surgeon: Elam Dutch, MD;  Location: [facility];  Service: Cardiovascular;  Laterality: Left;  . ABDOMINAL SURGERY    . APPENDECTOMY    . AV FISTULA PLACEMENT Right 03/11/2018   Procedure: CREATION Brachiocephalic Fistula RIGHT ARM;  Surgeon: Rosetta Posner, MD;  Location: [facility] OR;   Service: Vascular;  Laterality: Right;  . AV FISTULA PLACEMENT Left 05/06/2019   Procedure: LEFT BRACHIOCEPHALIC ARTERIOVENOUS (AV) FISTULA CREATION;  Surgeon: Marty Heck, MD;  Location: [facility];  Service: Vascular;  Laterality: Left;  . BACK SURGERY     x3; cages in patient's back since 2000  . BIOPSY  11/12/2018   Procedure: BIOPSY;  Surgeon: Laurence Spates, MD;  Location: [facility];  Service: Endoscopy;;  . CARDIAC CATHETERIZATION     stent  . CHOLECYSTECTOMY    . CLOSED REDUCTION SHOULDER DISLOCATION    . COLONOSCOPY W/ BIOPSIES AND POLYPECTOMY    . COLONOSCOPY WITH PROPOFOL N/A 11/12/2018   Procedure: COLONOSCOPY WITH PROPOFOL;  Surgeon: Laurence Spates, MD;  Location: [facility];  Service: Endoscopy;  Laterality: N/A;  . CORONARY ANGIOPLASTY    . diabetic ulcers    . DIAGNOSTIC LAPAROSCOPY    . ESOPHAGOGASTRODUODENOSCOPY (EGD) WITH PROPOFOL N/A 11/12/2018   Procedure: ESOPHAGOGASTRODUODENOSCOPY (EGD) WITH PROPOFOL;  Surgeon: Laurence Spates, MD;  Location: [facility];  Service: Endoscopy;  Laterality: N/A;  . GROIN EXPLORATION    . HEMOSTASIS CLIP PLACEMENT  11/12/2018   Procedure: HEMOSTASIS CLIP PLACEMENT;  Surgeon: Laurence Spates, MD;  [facility]: [facility];  Service: Endoscopy;;  . INSERT / REPLACE / REMOVE PACEMAKER    . INSERTION OF ARTERIOVENOUS (AV) ARTEGRAFT ARM Left 03/18/2018   Procedure: INSERTION OF ARTERIOVENOUS (AV) FISTULA;  Surgeon: Rosetta Posner, MD;  Location: [facility];  Service: Vascular;  Laterality: Left;  . IR FLUORO GUIDE CV LINE RIGHT  04/11/2019  . IR US GUIDE VASC ACCESS RIGHT  04/11/2019  . LEFT HEART CATH AND CORONARY ANGIOGRAPHY N/A 07/23/2019   Procedure: LEFT HEART CATH AND CORONARY ANGIOGRAPHY;  Surgeon: Martinique, Peter M, MD;  Location: [facility];  Service: Cardiovascular;  Laterality: N/A;  . LIGATION ARTERIOVENOUS GORTEX GRAFT Right 03/18/2018   Procedure: LIGATION ARTERIOVENOUS FISTULA;  Surgeon: Rosetta Posner, MD;  Location: [facility];   Service: Vascular;  Laterality: Right;  . OSTECTOMY Left 04/23/2017   Procedure: OSTECTOMY LEFT GREAT TOE;  Surgeon: [name], DPM;  Location: [facility];  Service: Podiatry;  Laterality: Left;  left great toe  . POLYPECTOMY  11/12/2018   Procedure: POLYPECTOMY;  Surgeon: Laurence Spates, MD;  Location: [facility];  Service: Endoscopy;;  . TUMOR REMOVAL     small intestine x3  . VISCERAL ANGIOGRAPHY N/A 08/02/2019   Procedure: VISCERAL ANGIOGRAPHY;  Surgeon: Waynetta Sandy, MD;  Location: [facility];  Service: Cardiovascular;  Laterality: N/A;    Current Medications: Current Meds  Medication Sig  . acetaminophen (TYLENOL) 500 MG tablet Take 1,000 mg by mouth every 6 (six) hours as needed for moderate pain.  Marland Kitchen albuterol (VENTOLIN HFA) 108 (90 BASE) MCG/ACT inhaler Inhale 2 puffs into the lungs every 6 (six) hours as needed for wheezing or shortness of breath.   Marland Kitchen aspirin EC 81 MG  EC tablet Take 1 tablet (81 mg total) by mouth daily.  Marland Kitchen atorvastatin (LIPITOR) 40 MG tablet Take 1 tablet (40 mg total) by mouth every evening. (Patient taking differently: Take 40 mg by mouth daily. )  . beclomethasone (QVAR) 80 MCG/ACT inhaler Inhale 2 puffs into the lungs 2 (two) times daily as needed (shortness of breath).  . bumetanide (BUMEX) 2 MG tablet Take 2 mg by mouth 2 (two) times daily.  Marland Kitchen buPROPion (WELLBUTRIN SR) 150 MG 12 hr tablet Take 150 mg by mouth 2 (two) times daily.  . carvedilol (COREG) 25 MG tablet TAKE ONE TABLET BY MOUTH TWICE DAILY. (Patient taking differently: Take 25 mg by mouth See admin instructions. Take 25 mg twice daily on Sun, Mon, Wed, and Fri. Take 25 mg in the evening on Tues, Thurs, and Sat (dialysis days))  . Cholecalciferol (VITAMIN D3) 2000 units TABS Take 2,000 Units by mouth every morning.   . diltiazem (CARDIZEM) 30 MG tablet TAKE ONE TABLET BY MOUTH TWICE DAILY.  . isosorbide mononitrate (IMDUR) 30 MG 24 hr tablet Take 30 mg by mouth daily.  Marland Kitchen  linaclotide (LINZESS) 290 MCG CAPS capsule Take 290 mcg by mouth daily as needed (constipation).  . Melatonin 10 MG TABS Take 10 mg by mouth at bedtime as needed (sleep).   . Multiple Vitamin (MULTIVITAMIN WITH MINERALS) TABS tablet Take 1 tablet by mouth every morning.   . nitroGLYCERIN (NITROSTAT) 0.4 MG SL tablet Place 1 tablet (0.4 mg total) under the tongue every 5 (five) minutes as needed for chest pain.  Marland Kitchen omeprazole (PRILOSEC) 20 MG capsule Take 20 mg by mouth every morning.   . polyethylene glycol (MIRALAX / GLYCOLAX) packet Take 17 g by mouth daily as needed for mild constipation. Mix in morning coffee  . Sennosides 25 MG TABS Take 50 mg by mouth at bedtime.  . sevelamer carbonate (RENVELA) 800 MG tablet Take 800-1,600 mg by mouth See admin instructions. Take 1600 mg with each meal and 800 mg with each snack  . silver sulfADIAZINE (SILVADENE) 1 % cream Apply 1 application topically daily as needed (For wound care with dressing).      Allergies:   Patient has no known allergies.   Social History   Socioeconomic History  . Marital status: Married    Spouse name: Not on file  . Number of children: 2  . Years of education: Not on file  . Highest education level: Not on file  Occupational History  . Occupation: retired Engineer, structural  Tobacco Use  . Smoking status: Current Every Day Smoker    Packs/day: 1.00    Years: 40.00    Pack years: 40.00    Types: Cigarettes    Start date: 05/20/1968  . Smokeless tobacco: Never Used  Substance and Sexual Activity  . Alcohol use: Not Currently    Alcohol/week: 0.0 standard drinks    Comment: rare  . Drug use: No  . Sexual activity: Yes  Other Topics Concern  . Not on file  Social History Narrative   Patient continues to smoke.    Social Determinants of Health   Financial Resource Strain:   . Difficulty of Paying Living Expenses:   Food Insecurity:   . Worried About Charity fundraiser in the Last Year:   . Arboriculturist in  the Last Year:   Transportation Needs:   . Film/video editor (Medical):   Marland Kitchen Lack of Transportation (Non-Medical):   Physical Activity:   .  Days of Exercise per Week:   . Minutes of Exercise per Session:   Stress:   . Feeling of Stress :   Social Connections:   . Frequency of Communication with Friends and Family:   . Frequency of Social Gatherings with Friends and Family:   . Attends Religious Services:   . Active Member of Clubs or Organizations:   . Attends Archivist Meetings:   Marland Kitchen Marital Status:      Family History:  The patient's   family history includes Alcohol abuse in his father; Diabetes in his mother.   ROS:   Please see the history of present illness.    ROS All other systems reviewed and are negative.   PHYSICAL EXAM:   VS:  BP (!) 128/58   Pulse 60   Temp 98.3 F (36.8 C)   Ht 6\' 4"  (1.93 m)   Wt 235 lb 6.4 oz (106.8 kg)   SpO2 95%   BMI 28.65 kg/m   Physical Exam  GEN: Well nourished, well developed, in no acute distress  Neck: no JVD, carotid bruits, or masses Cardiac:RRR; 1/6 systolic murmur at the left sternal border Respiratory: Diffuse inspiratory and expiratory wheezing throughout  GI: soft, nontender, nondistended, + BS Ext: without cyanosis, clubbing, or edema, Good distal pulses bilaterally Neuro:  Alert and Oriented x 3 Psych: euthymic mood, full affect  Wt Readings from Last 3 Encounters:  08/09/19 235 lb 6.4 oz (106.8 kg)  08/02/19 230 lb (104.3 kg)  07/30/19 230 lb (104.3 kg)      Studies/Labs Reviewed:   EKG:  EKG is not ordered today.    Recent Labs: 04/10/2019: B Natriuretic Peptide 1,349.3 04/11/2019: Magnesium 2.0 07/22/2019: ALT 15 07/25/2019: Platelets 149 08/02/2019: BUN 50; Creatinine, Ser 7.90; Hemoglobin 13.6; Potassium 4.5; Sodium 132   Lipid Panel    Component Value Date/Time   CHOL 93 07/23/2019 0557   TRIG 95 07/23/2019 0557   HDL 38 (L) 07/23/2019 0557   CHOLHDL 2.4 07/23/2019 0557   VLDL 19  07/23/2019 0557   LDLCALC 36 07/23/2019 0557    Additional studies/ records that were reviewed today include:  Cardiac cath 07/23/19  Mid LM to Dist LM lesion is 25% stenosed.  Mid LAD lesion is 40% stenosed.  1st Diag-1 lesion is 80% stenosed.  1st Diag-2 lesion is 90% stenosed.  1st Diag-3 lesion is 90% stenosed.  Ost Cx to Prox Cx lesion is 75% stenosed.  Mid Cx lesion is 90% stenosed.  Prox RCA lesion is 35% stenosed.  Dist RCA lesion is 50% stenosed.  The left ventricular systolic function is normal.  LV end diastolic pressure is normal.  The left ventricular ejection fraction is 55-65% by visual estimate.   1. Severe 2 vessel obstructive CAD. The RCA and LAD disease is stable compared to 2003. There is progressive disease in a large diagonal branch and the LCx    - the diagonal is a large bifurcating vessel.  It has multiple severe stenoses in the main branch with severely calcified lesions    - 75% proximal LCx and 90% mid LCx. There is an acutely angulated take off from the left main and the vessel is severely calcified. 2. Normal LV function 3. Normal LVEDP   Plan: discussed with Dr Burt Knack. The diagonal is poorly suited for PCI due to diffuse and heavily calcified disease. The LCx is complex and would require atherectomy. The acute angulation would make it technically difficult. For now would recommend  aggressive medical therapy.   Visceral angiography 08/02/2019   Findings: Aorta has a small infrarenal aneurysm.  There is heavy calcification but no flow-limiting stenosis.  The SMA was cannulated again calcification was identified without flow-limiting stenosis.  No intervention was undertaken.     Mesenteric limited 07/30/2019            Mesenteric:  70 to 99% stenosis in the superior mesenteric artery. The Inferior  Mesenteric  artery appears stenotic.  Aneurysmal dilatation of the distal aorta and right CIA.    *See table(s) above for measurements and  observations.    Diagnosing physician: Servando Snare MD    Electronically signed by Servando Snare MD on 07/30/2019 at 4:00:28 PM.         Final       ASSESSMENT:    1. Coronary artery disease involving native coronary artery of native heart without angina pectoris   2. Essential hypertension   3. Paroxysmal atrial fibrillation ([facility])   4. Symptomatic bradycardia   5. Chronic diastolic CHF (congestive heart failure) ([place])   6. AAA (abdominal aortic aneurysm) without rupture ([place])   7. ESRD (end stage renal disease) ([place])      PLAN:  In order of problems listed above:   CAD status post DES to the RCA in 1999, cath in 2003 with moderate CAD medical treatment recommended, NST 11/2018 no ischemia, echo 11/2018 normal LV function with impaired relaxation 07/23/2019 with severe two-vessel obstructive CAD with progressive disease in the diagonal and circumflex poorly suited for PCI.  Medical therapy recommended.  Chest pain mostly after dialysis and we asked that his dry weights be increased. He's doing a little better but didn't tolerate higher doses of Imdur. Will try Ranexa and keep f/u with Dr. [name] April 5  Essential hypertension blood pressure compensated  Paroxysmal atrial fibrillation Eliquis stopped 10/2018 because of anemia and end-stage renal disease  Symptomatic bradycardia with posttermination pauses status post pacemaker followed by Dr. Rayann Heman  Chronic diastolic CHF compensated  Abdominal aortic aneurysm 3.7 x 3.3 on CT 04/2019-small infrarenal aneurysm on angiogram 08/02/19  Mesenteric ischemia exacerbated with dialysis-continues with significant pain after dialysis see angiogram above.  ESRD on HD-being evaluated for possible peritoneal dialysis  Tobacco abuse still smoking 1 pack/day.  Smoking cessation discussed with patient.  Medication Adjustments/Labs and Tests Ordered: Current medicines are reviewed at length with the patient today.  Concerns regarding medicines  are outlined above.  Medication changes, Labs and Tests ordered today are listed in the Patient Instructions below. Patient Instructions  Medication Instructions:  START Ranexa 500 mg twice a day  *If you need a refill on your cardiac medications before your next appointment, please call your pharmacy*   Lab Work: None today If you have labs (blood work) drawn today and your tests are completely normal, you will receive your results only by: Marland Kitchen MyChart Message (if you have MyChart) OR . A paper copy in the mail If you have any lab test that is abnormal or we need to change your treatment, we will call you to review the results.   Testing/Procedures: None today   Follow-Up: At [facility], you and your health needs are our priority.  As part of our continuing mission to provide you with exceptional heart care, we have created designated Provider Care Teams.  These Care Teams include your primary Cardiologist (physician) and Advanced Practice Providers (APPs -  Physician Assistants and Nurse Practitioners) who all work together to provide you  with the care you need, when you need it.  We recommend signing up for the patient portal called "MyChart".  Sign up information is provided on this After Visit Summary.  MyChart is used to connect with patients for Virtual Visits (Telemedicine).  Patients are able to view lab/test results, encounter notes, upcoming appointments, etc.  Non-urgent messages can be sent to your provider as well.   To learn more about what you can do with MyChart, go to [email or web address].    Your next appointment:  Keep your apt already scheduled for 08/23/19 at 1:40 pm with Dr.Branch.    Thank you for choosing [facility] !           Sumner Boast, PA-C  08/09/2019 2:38 PM    [facility] [address], [place], [place]  [address] Phone: [phone]; Fax: [phone]

## 2019-08-04 NOTE — Telephone Encounter (Signed)
Pt voiced understanding - updated medication list and pt has f/u 3/22 with extender. NTG sent to [facility] in [place] as requested

## 2019-08-04 NOTE — Progress Notes (Signed)
   Covid-19 Vaccination Clinic  Name:  [name]    MRN: [id number] DOB: 07-10-1950  08/04/2019  [name] was observed post Covid-19 immunization for 15 minutes without incident. He was provided with Vaccine Information Sheet and instruction to access the V-Safe system.   [name] was instructed to call 911 with any severe reactions post vaccine: Marland Kitchen Difficulty breathing  . Swelling of face and throat  . A fast heartbeat  . A bad rash all over body  . Dizziness and weakness   Immunizations Administered    Name Date Dose VIS Date Route   Pfizer COVID-19 Vaccine 08/04/2019 10:08 AM 0.3 mL 04/30/2019 Intramuscular   Manufacturer: [place]   Lot: [id number]   [place]: [id number]

## 2019-08-05 DIAGNOSIS — R112 Nausea with vomiting, unspecified: Secondary | ICD-10-CM | POA: Diagnosis not present

## 2019-08-05 DIAGNOSIS — N2581 Secondary hyperparathyroidism of renal origin: Secondary | ICD-10-CM | POA: Diagnosis not present

## 2019-08-05 DIAGNOSIS — D631 Anemia in chronic kidney disease: Secondary | ICD-10-CM | POA: Diagnosis not present

## 2019-08-05 DIAGNOSIS — D509 Iron deficiency anemia, unspecified: Secondary | ICD-10-CM | POA: Diagnosis not present

## 2019-08-05 DIAGNOSIS — Z992 Dependence on renal dialysis: Secondary | ICD-10-CM | POA: Diagnosis not present

## 2019-08-05 DIAGNOSIS — N186 End stage renal disease: Secondary | ICD-10-CM | POA: Diagnosis not present

## 2019-08-07 DIAGNOSIS — N186 End stage renal disease: Secondary | ICD-10-CM | POA: Diagnosis not present

## 2019-08-07 DIAGNOSIS — N2581 Secondary hyperparathyroidism of renal origin: Secondary | ICD-10-CM | POA: Diagnosis not present

## 2019-08-07 DIAGNOSIS — Z992 Dependence on renal dialysis: Secondary | ICD-10-CM | POA: Diagnosis not present

## 2019-08-07 DIAGNOSIS — D509 Iron deficiency anemia, unspecified: Secondary | ICD-10-CM | POA: Diagnosis not present

## 2019-08-07 DIAGNOSIS — D631 Anemia in chronic kidney disease: Secondary | ICD-10-CM | POA: Diagnosis not present

## 2019-08-07 DIAGNOSIS — R112 Nausea with vomiting, unspecified: Secondary | ICD-10-CM | POA: Diagnosis not present

## 2019-08-09 ENCOUNTER — Ambulatory Visit (INDEPENDENT_AMBULATORY_CARE_PROVIDER_SITE_OTHER): Payer: Medicare Other | Admitting: Physician Assistant

## 2019-08-09 ENCOUNTER — Other Ambulatory Visit: Payer: Self-pay

## 2019-08-09 ENCOUNTER — Other Ambulatory Visit: Payer: Self-pay | Admitting: Cardiology

## 2019-08-09 VITALS — BP 128/58 | HR 60 | Temp 98.3°F | Ht 76.0 in | Wt 235.4 lb

## 2019-08-09 DIAGNOSIS — I714 Abdominal aortic aneurysm, without rupture, unspecified: Secondary | ICD-10-CM

## 2019-08-09 DIAGNOSIS — I10 Essential (primary) hypertension: Secondary | ICD-10-CM

## 2019-08-09 DIAGNOSIS — N186 End stage renal disease: Secondary | ICD-10-CM | POA: Diagnosis not present

## 2019-08-09 DIAGNOSIS — R001 Bradycardia, unspecified: Secondary | ICD-10-CM

## 2019-08-09 DIAGNOSIS — I2510 Atherosclerotic heart disease of native coronary artery without angina pectoris: Secondary | ICD-10-CM | POA: Diagnosis not present

## 2019-08-09 DIAGNOSIS — I5032 Chronic diastolic (congestive) heart failure: Secondary | ICD-10-CM | POA: Diagnosis not present

## 2019-08-09 DIAGNOSIS — I480 Paroxysmal atrial fibrillation: Secondary | ICD-10-CM

## 2019-08-09 MED ORDER — RANOLAZINE ER 500 MG PO TB12: 500.0000 mg | ORAL_TABLET | Freq: Two times a day (BID) | ORAL | 6 refills | Status: DC

## 2019-08-09 NOTE — Patient Instructions (Addendum)
Medication Instructions:  START Ranexa 500 mg twice a day  *If you need a refill on your cardiac medications before your next appointment, please call your pharmacy*   Lab Work: None today If you have labs (blood work) drawn today and your tests are completely normal, you will receive your results only by: Marland Kitchen MyChart Message (if you have MyChart) OR . A paper copy in the mail If you have any lab test that is abnormal or we need to change your treatment, we will call you to review the results.   Testing/Procedures: None today   Follow-Up: At [facility], you and your health needs are our priority.  As part of our continuing mission to provide you with exceptional heart care, we have created designated Provider Care Teams.  These Care Teams include your primary Cardiologist (physician) and Advanced Practice Providers (APPs -  Physician Assistants and Nurse Practitioners) who all work together to provide you with the care you need, when you need it.  We recommend signing up for the patient portal called "MyChart".  Sign up information is provided on this After Visit Summary.  MyChart is used to connect with patients for Virtual Visits (Telemedicine).  Patients are able to view lab/test results, encounter notes, upcoming appointments, etc.  Non-urgent messages can be sent to your provider as well.   To learn more about what you can do with MyChart, go to [email or web address].    Your next appointment:  Keep your apt already scheduled for 08/23/19 at 1:40 pm with Dr.Branch.    Thank you for choosing [facility] !

## 2019-08-10 DIAGNOSIS — N186 End stage renal disease: Secondary | ICD-10-CM | POA: Diagnosis not present

## 2019-08-10 DIAGNOSIS — Z992 Dependence on renal dialysis: Secondary | ICD-10-CM | POA: Diagnosis not present

## 2019-08-10 DIAGNOSIS — R109 Unspecified abdominal pain: Secondary | ICD-10-CM | POA: Diagnosis not present

## 2019-08-10 DIAGNOSIS — D509 Iron deficiency anemia, unspecified: Secondary | ICD-10-CM | POA: Diagnosis not present

## 2019-08-10 DIAGNOSIS — R112 Nausea with vomiting, unspecified: Secondary | ICD-10-CM | POA: Diagnosis not present

## 2019-08-10 DIAGNOSIS — N2581 Secondary hyperparathyroidism of renal origin: Secondary | ICD-10-CM | POA: Diagnosis not present

## 2019-08-10 DIAGNOSIS — D631 Anemia in chronic kidney disease: Secondary | ICD-10-CM | POA: Diagnosis not present

## 2019-08-12 DIAGNOSIS — N2581 Secondary hyperparathyroidism of renal origin: Secondary | ICD-10-CM | POA: Diagnosis not present

## 2019-08-12 DIAGNOSIS — Z992 Dependence on renal dialysis: Secondary | ICD-10-CM | POA: Diagnosis not present

## 2019-08-12 DIAGNOSIS — D509 Iron deficiency anemia, unspecified: Secondary | ICD-10-CM | POA: Diagnosis not present

## 2019-08-12 DIAGNOSIS — R112 Nausea with vomiting, unspecified: Secondary | ICD-10-CM | POA: Diagnosis not present

## 2019-08-12 DIAGNOSIS — N186 End stage renal disease: Secondary | ICD-10-CM | POA: Diagnosis not present

## 2019-08-12 DIAGNOSIS — D631 Anemia in chronic kidney disease: Secondary | ICD-10-CM | POA: Diagnosis not present

## 2019-08-13 ENCOUNTER — Other Ambulatory Visit: Payer: Self-pay | Admitting: *Deleted

## 2019-08-13 MED ORDER — ATORVASTATIN CALCIUM 40 MG PO TABS: 40.0000 mg | ORAL_TABLET | Freq: Every day | ORAL | 1 refills | Status: DC

## 2019-08-14 DIAGNOSIS — Z992 Dependence on renal dialysis: Secondary | ICD-10-CM | POA: Diagnosis not present

## 2019-08-14 DIAGNOSIS — N2581 Secondary hyperparathyroidism of renal origin: Secondary | ICD-10-CM | POA: Diagnosis not present

## 2019-08-14 DIAGNOSIS — N186 End stage renal disease: Secondary | ICD-10-CM | POA: Diagnosis not present

## 2019-08-14 DIAGNOSIS — D631 Anemia in chronic kidney disease: Secondary | ICD-10-CM | POA: Diagnosis not present

## 2019-08-14 DIAGNOSIS — D509 Iron deficiency anemia, unspecified: Secondary | ICD-10-CM | POA: Diagnosis not present

## 2019-08-14 DIAGNOSIS — R112 Nausea with vomiting, unspecified: Secondary | ICD-10-CM | POA: Diagnosis not present

## 2019-08-15 DIAGNOSIS — N2581 Secondary hyperparathyroidism of renal origin: Secondary | ICD-10-CM | POA: Diagnosis not present

## 2019-08-15 DIAGNOSIS — D509 Iron deficiency anemia, unspecified: Secondary | ICD-10-CM | POA: Diagnosis not present

## 2019-08-15 DIAGNOSIS — D631 Anemia in chronic kidney disease: Secondary | ICD-10-CM | POA: Diagnosis not present

## 2019-08-15 DIAGNOSIS — N186 End stage renal disease: Secondary | ICD-10-CM | POA: Diagnosis not present

## 2019-08-15 DIAGNOSIS — R112 Nausea with vomiting, unspecified: Secondary | ICD-10-CM | POA: Diagnosis not present

## 2019-08-15 DIAGNOSIS — Z992 Dependence on renal dialysis: Secondary | ICD-10-CM | POA: Diagnosis not present

## 2019-08-16 DIAGNOSIS — E1142 Type 2 diabetes mellitus with diabetic polyneuropathy: Secondary | ICD-10-CM | POA: Diagnosis not present

## 2019-08-16 DIAGNOSIS — L97519 Non-pressure chronic ulcer of other part of right foot with unspecified severity: Secondary | ICD-10-CM | POA: Diagnosis not present

## 2019-08-17 DIAGNOSIS — R112 Nausea with vomiting, unspecified: Secondary | ICD-10-CM | POA: Diagnosis not present

## 2019-08-17 DIAGNOSIS — D631 Anemia in chronic kidney disease: Secondary | ICD-10-CM | POA: Diagnosis not present

## 2019-08-17 DIAGNOSIS — Z992 Dependence on renal dialysis: Secondary | ICD-10-CM | POA: Diagnosis not present

## 2019-08-17 DIAGNOSIS — N186 End stage renal disease: Secondary | ICD-10-CM | POA: Diagnosis not present

## 2019-08-17 DIAGNOSIS — D509 Iron deficiency anemia, unspecified: Secondary | ICD-10-CM | POA: Diagnosis not present

## 2019-08-17 DIAGNOSIS — N2581 Secondary hyperparathyroidism of renal origin: Secondary | ICD-10-CM | POA: Diagnosis not present

## 2019-08-18 ENCOUNTER — Telehealth: Payer: Medicare Other | Admitting: Urology

## 2019-08-18 ENCOUNTER — Other Ambulatory Visit: Payer: Self-pay

## 2019-08-18 ENCOUNTER — Telehealth (INDEPENDENT_AMBULATORY_CARE_PROVIDER_SITE_OTHER): Payer: Medicare Other | Admitting: Urology

## 2019-08-18 DIAGNOSIS — Z5329 Procedure and treatment not carried out because of patient's decision for other reasons: Secondary | ICD-10-CM

## 2019-08-18 DIAGNOSIS — N2889 Other specified disorders of kidney and ureter: Secondary | ICD-10-CM

## 2019-08-18 DIAGNOSIS — N186 End stage renal disease: Secondary | ICD-10-CM | POA: Diagnosis not present

## 2019-08-18 NOTE — Progress Notes (Signed)
Dr. Alyson Ingles attempted to call patient with no answer. Pt returned call. Per Dr. Alyson Ingles recent imaging was negative and to have follow up in 6 months with renal ultrasound. Pt notified and voiced understanding.

## 2019-08-19 DIAGNOSIS — N2581 Secondary hyperparathyroidism of renal origin: Secondary | ICD-10-CM | POA: Diagnosis not present

## 2019-08-19 DIAGNOSIS — N186 End stage renal disease: Secondary | ICD-10-CM | POA: Diagnosis not present

## 2019-08-19 DIAGNOSIS — Z992 Dependence on renal dialysis: Secondary | ICD-10-CM | POA: Diagnosis not present

## 2019-08-19 DIAGNOSIS — D509 Iron deficiency anemia, unspecified: Secondary | ICD-10-CM | POA: Diagnosis not present

## 2019-08-19 DIAGNOSIS — R112 Nausea with vomiting, unspecified: Secondary | ICD-10-CM | POA: Diagnosis not present

## 2019-08-21 DIAGNOSIS — D509 Iron deficiency anemia, unspecified: Secondary | ICD-10-CM | POA: Diagnosis not present

## 2019-08-21 DIAGNOSIS — N186 End stage renal disease: Secondary | ICD-10-CM | POA: Diagnosis not present

## 2019-08-21 DIAGNOSIS — N2581 Secondary hyperparathyroidism of renal origin: Secondary | ICD-10-CM | POA: Diagnosis not present

## 2019-08-21 DIAGNOSIS — Z992 Dependence on renal dialysis: Secondary | ICD-10-CM | POA: Diagnosis not present

## 2019-08-21 DIAGNOSIS — R112 Nausea with vomiting, unspecified: Secondary | ICD-10-CM | POA: Diagnosis not present

## 2019-08-23 ENCOUNTER — Encounter: Payer: Self-pay | Admitting: Cardiology

## 2019-08-23 ENCOUNTER — Ambulatory Visit (INDEPENDENT_AMBULATORY_CARE_PROVIDER_SITE_OTHER): Payer: Medicare Other | Admitting: Cardiology

## 2019-08-23 ENCOUNTER — Other Ambulatory Visit: Payer: Self-pay

## 2019-08-23 VITALS — BP 138/78 | HR 60 | Temp 97.3°F | Ht 76.0 in | Wt 232.0 lb

## 2019-08-23 DIAGNOSIS — E782 Mixed hyperlipidemia: Secondary | ICD-10-CM | POA: Diagnosis not present

## 2019-08-23 DIAGNOSIS — I10 Essential (primary) hypertension: Secondary | ICD-10-CM | POA: Diagnosis not present

## 2019-08-23 DIAGNOSIS — I2510 Atherosclerotic heart disease of native coronary artery without angina pectoris: Secondary | ICD-10-CM | POA: Diagnosis not present

## 2019-08-23 MED ORDER — ATORVASTATIN CALCIUM 40 MG PO TABS: 40.0000 mg | ORAL_TABLET | Freq: Every day | ORAL | 3 refills | Status: DC

## 2019-08-23 NOTE — Progress Notes (Signed)
Clinical Summary Mr. Wajda is a 69 y.o.male seen today for follow up of the following medical problems.    1. Aflutter/Afib - he is on eliquis - low afib burden by pacemaker check  - no recent palpitations. No bleeding on eliquis.   - eliquis stopped 10/2018 due to anemia by Dr Rayann Heman  - issues with GI bleeding 10/2018, heme +stools, iron deficiency. Colonscopy with nonbleeding angiodysplastic lesion, small polyps. EGD polyp no signs of bleeding.      2. Symptomatic bradycardia - s/p pacemaker placement following episode of syncope - admit 09/28/17 to [place] [place] [facility] with dizziness and syncope. - followed by EP. Chronically elevated RV lead threshold but stable - normal device check 01/2018  - no recent symptoms.    3. OSA screen - very mild OSA by 11/2017 study,did have sleep hypoxemia   4. Carotid stenosis - noted during recent admission for syncope. -09/2017 carotid US in [place] with  RICA <16%, LICA 10-96%    5. HTN - compliant with meds  6. CAD  - prior stent to RCA in 1999. Last cath 05/2001 : LM 20-30%, LAD 50-60% And 60-70% mid, LCX with mid AV portion 60-70%, RCA with patent stent with proximal 40-50% disease. LVEF by LVgram 65%, LVEDP 20. Overall moderate lesions, not great anatomically for PCI per notes, treated medically.  - 10/2014 Lexiscan with small area of ischemia mid inferior to apical, overall low risk - Jan 2019 nuclear stress no ischemia  - admit 07/2019 with NSTEMI (peak trop 229), cath as reported below. Overall complex anatomy difficult to pursue with PCI, recs for medical management.  He only develops symptoms postdialysis when his blood pressure is low.  - imdur lowered to 30mg  due to headaches.  - started on ranexa 500mg  bid by PA Lenze at 3/22 visit - he reports HD has increased his dry weight with some improvement.  - pain diffuse midabdomen, typically grabbing like pain. +SOB, +hot. Can last 10 minutes up to  2 hours.  - some mild chest discomfort with exertion.   - insurance would not cover ranexa - SBP after HD SBP in 90s.    7.Chronic diastolic HF -04/[id number] LVEF 60-65%, grade II diastoilc dysfunction -compliant with meds  - occasional LE edema, fairly well controlled with diuretics.    8. ESRD - followed by renal     6. Hyperlipidemia  -compliant with statin   7. COPD - followed by pcp   8. AAA 06/2016 CT A/P 3.4 cm infrerenal AAA   Past Medical History:  Diagnosis Date  . AAA (abdominal aortic aneurysm) ([place]) 06/2016   Mild increase in size of 3.4 cm infrarenal abdominal aortic aneurysm  . Anemia   . Aortic atherosclerosis ([place])   . Asthma   . Atrial flutter ([place])   . AVF (arteriovenous fistula) ([facility])    Left forearm  . CAD (coronary artery disease)    a. s/p prior stenting of RCA in 1999 b. cath in 2003 showing moderate CAD c. NST in 2016 showing small area of ischemic and low-risk  . CHF (congestive heart failure) ([place])   . CKD (chronic kidney disease), stage V ([place])    kidney transplant evaluation  . COPD (chronic obstructive pulmonary disease) ([place])    with ongoing tobacco use and patient failed sham takes  . Diverticulosis 2018   Mild sigmoid colon diverticulosis.  Marland Kitchen DM2 (diabetes mellitus, type 2) ([place])   . Dyslipidemia   . Dysrhythmia 2018  atrial fibrillation  . Edema    chronic lower extremity secondary to right heart failure and chronic venous insufficiency  . Fatigue   . Fatty liver 2010   Mild  . Headache   . HTN (hypertension)   . Morbid obesity ([place])   . Multiple pulmonary nodules 05/2018   Bilateral  . Pneumonia   . Presence of permanent cardiac pacemaker   . 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   Marland Kitchen Reflux esophagitis    hx  . Sleep apnea    PT STATES ONE TEST WAS POSITIVE AND ANOTHER TEST WAS NEGATIVE  . Two-vessel coronary artery disease    moderate.  by cath in 2003. Status post stenting of the mdi RCA November 1999 normal left ventricular ejection fraction  . Wears glasses   . Wears hearing aid    B/L     No Known Allergies   Current Outpatient Medications  Medication Sig Dispense Refill  . acetaminophen (TYLENOL) 500 MG tablet Take 1,000 mg by mouth every 6 (six) hours as needed for moderate pain.    Marland Kitchen albuterol (VENTOLIN HFA) 108 (90 BASE) MCG/ACT inhaler Inhale 2 puffs into the lungs every 6 (six) hours as needed for wheezing or shortness of breath.     Marland Kitchen aspirin EC 81 MG EC tablet Take 1 tablet (81 mg total) by mouth daily.    Marland Kitchen atorvastatin (LIPITOR) 40 MG tablet Take 1 tablet (40 mg total) by mouth daily. 90 tablet 1  . beclomethasone (QVAR) 80 MCG/ACT inhaler Inhale 2 puffs into the lungs 2 (two) times daily as needed (shortness of breath).    . bumetanide (BUMEX) 2 MG tablet Take 2 mg by mouth 2 (two) times daily.    Marland Kitchen buPROPion (WELLBUTRIN SR) 150 MG 12 hr tablet Take 150 mg by mouth 2 (two) times daily.    . carvedilol (COREG) 25 MG tablet TAKE ONE TABLET BY MOUTH TWICE DAILY. (Patient taking differently: Take 25 mg by mouth See admin instructions. Take 25 mg twice daily on Sun, Mon, Wed, and Fri. Take 25 mg in the evening on Tues, Thurs, and Sat (dialysis days)) 180 tablet 2  . Cholecalciferol (VITAMIN D3) 2000 units TABS Take 2,000 Units by mouth every morning.     . diltiazem (CARDIZEM) 30 MG tablet TAKE ONE TABLET BY MOUTH TWICE DAILY. 60 tablet 6  . isosorbide mononitrate (IMDUR) 30 MG 24 hr tablet Take 30 mg by mouth daily.    Marland Kitchen linaclotide (LINZESS) 290 MCG CAPS capsule Take 290 mcg by mouth daily as needed (constipation).    . Melatonin 10 MG TABS Take 10 mg by mouth at bedtime as needed (sleep).     . Multiple Vitamin (MULTIVITAMIN WITH MINERALS) TABS tablet Take 1 tablet by mouth every morning.     . nitroGLYCERIN (NITROSTAT) 0.4 MG SL tablet Place 1 tablet (0.4 mg total) under the tongue every 5 (five) minutes as  needed for chest pain. 25 tablet 3  . omeprazole (PRILOSEC) 20 MG capsule Take 20 mg by mouth every morning.     . polyethylene glycol (MIRALAX / GLYCOLAX) packet Take 17 g by mouth daily as needed for mild constipation. Mix in morning coffee    . ranolazine (RANEXA) 500 MG 12 hr tablet Take 1 tablet (500 mg total) by mouth 2 (two) times daily. 60 tablet 6  . Sennosides 25 MG TABS Take 50 mg by mouth at bedtime.    Marland Kitchen  sevelamer carbonate (RENVELA) 800 MG tablet Take 800-1,600 mg by mouth See admin instructions. Take 1600 mg with each meal and 800 mg with each snack    . silver sulfADIAZINE (SILVADENE) 1 % cream Apply 1 application topically daily as needed (For wound care with dressing).      No current facility-administered medications for this visit.     Past Surgical History:  Procedure Laterality Date  . A/V FISTULAGRAM Left 04/30/2019   Procedure: A/V FISTULAGRAM;  Surgeon: Elam Dutch, MD;  Location: [facility];  Service: Cardiovascular;  Laterality: Left;  . ABDOMINAL SURGERY    . APPENDECTOMY    . AV FISTULA PLACEMENT Right 03/11/2018   Procedure: CREATION Brachiocephalic Fistula RIGHT ARM;  Surgeon: Rosetta Posner, MD;  Location: [facility];  Service: Vascular;  Laterality: Right;  . AV FISTULA PLACEMENT Left 05/06/2019   Procedure: LEFT BRACHIOCEPHALIC ARTERIOVENOUS (AV) FISTULA CREATION;  Surgeon: Marty Heck, MD;  Location: [facility];  Service: Vascular;  Laterality: Left;  . BACK SURGERY     x3; cages in patient's back since 2000  . BIOPSY  11/12/2018   Procedure: BIOPSY;  Surgeon: Laurence Spates, MD;  Location: [facility];  Service: Endoscopy;;  . CARDIAC CATHETERIZATION     stent  . CHOLECYSTECTOMY    . CLOSED REDUCTION SHOULDER DISLOCATION    . COLONOSCOPY W/ BIOPSIES AND POLYPECTOMY    . COLONOSCOPY WITH PROPOFOL N/A 11/12/2018   Procedure: COLONOSCOPY WITH PROPOFOL;  Surgeon: Laurence Spates, MD;  Location: [facility];  Service: Endoscopy;  Laterality:  N/A;  . CORONARY ANGIOPLASTY    . diabetic ulcers    . DIAGNOSTIC LAPAROSCOPY    . ESOPHAGOGASTRODUODENOSCOPY (EGD) WITH PROPOFOL N/A 11/12/2018   Procedure: ESOPHAGOGASTRODUODENOSCOPY (EGD) WITH PROPOFOL;  Surgeon: Laurence Spates, MD;  Location: [facility];  Service: Endoscopy;  Laterality: N/A;  . GROIN EXPLORATION    . HEMOSTASIS CLIP PLACEMENT  11/12/2018   Procedure: HEMOSTASIS CLIP PLACEMENT;  Surgeon: Laurence Spates, MD;  Location: [facility];  Service: Endoscopy;;  . INSERT / REPLACE / REMOVE PACEMAKER    . INSERTION OF ARTERIOVENOUS (AV) ARTEGRAFT ARM Left 03/18/2018   Procedure: INSERTION OF ARTERIOVENOUS (AV) FISTULA;  Surgeon: Rosetta Posner, MD;  Location: [facility];  Service: Vascular;  Laterality: Left;  . IR FLUORO GUIDE CV LINE RIGHT  04/11/2019  . IR US GUIDE VASC ACCESS RIGHT  04/11/2019  . LEFT HEART CATH AND CORONARY ANGIOGRAPHY N/A 07/23/2019   Procedure: LEFT HEART CATH AND CORONARY ANGIOGRAPHY;  Surgeon: Martinique, Peter M, MD;  Location: [facility];  Service: Cardiovascular;  Laterality: N/A;  . LIGATION ARTERIOVENOUS GORTEX GRAFT Right 03/18/2018   Procedure: LIGATION ARTERIOVENOUS FISTULA;  Surgeon: Rosetta Posner, MD;  Location: [facility];  Service: Vascular;  Laterality: Right;  . OSTECTOMY Left 04/23/2017   Procedure: OSTECTOMY LEFT GREAT TOE;  Surgeon: [name], DPM;  Location: [facility];  Service: Podiatry;  Laterality: Left;  left great toe  . POLYPECTOMY  11/12/2018   Procedure: POLYPECTOMY;  Surgeon: Laurence Spates, MD;  Location: [facility];  Service: Endoscopy;;  . TUMOR REMOVAL     small intestine x3  . VISCERAL ANGIOGRAPHY N/A 08/02/2019   Procedure: VISCERAL ANGIOGRAPHY;  Surgeon: Waynetta Sandy, MD;  Location: [facility];  Service: Cardiovascular;  Laterality: N/A;     No Known Allergies    Family History  Problem Relation Age of Onset  . Diabetes Mother   . Alcohol abuse Father  Social History Mr. Lantry  reports that he has been smoking cigarettes. He started smoking about 51 years ago. He has a 40.00 pack-year smoking history. He has never used smokeless tobacco. Mr. Campos reports previous alcohol use.   Review of Systems CONSTITUTIONAL: No weight loss, fever, chills, weakness or fatigue.  HEENT: Eyes: No visual loss, blurred vision, double vision or yellow sclerae.No hearing loss, sneezing, congestion, runny nose or sore throat.  SKIN: No rash or itching.  CARDIOVASCULAR: per hpi RESPIRATORY: No shortness of breath, cough or sputum.  GASTROINTESTINAL: No anorexia, nausea, vomiting or diarrhea. No abdominal pain or blood.  GENITOURINARY: No burning on urination, no polyuria NEUROLOGICAL: No headache, dizziness, syncope, paralysis, ataxia, numbness or tingling in the extremities. No change in bowel or bladder control.  MUSCULOSKELETAL: No muscle, back pain, joint pain or stiffness.  LYMPHATICS: No enlarged nodes. No history of splenectomy.  PSYCHIATRIC: No history of depression or anxiety.  ENDOCRINOLOGIC: No reports of sweating, cold or heat intolerance. No polyuria or polydipsia.  Marland Kitchen   Physical Examination Today's Vitals   08/23/19 1301  BP: 138/78  Pulse: 60  Temp: (!) 97.3 F (36.3 C)  SpO2: 98%  Weight: 232 lb (105.2 kg)  Height: 6\' 4"  (1.93 m)   Body mass index is 28.24 kg/m.; Gen: resting comfortably, no acute distress HEENT: no scleral icterus, pupils equal round and reactive, no palptable cervical adenopathy,  CV: RRR, no m//r,g no jvd Resp: Clear to auscultation bilaterally GI: abdomen is soft, non-tender, non-distended, normal bowel sounds, no hepatosplenomegaly MSK: extremities are warm, no edema.  Skin: warm, no rash Neuro:  no focal deficits Psych: appropriate affect   Diagnostic Studies 05/2001 Cath FINDINGS:  1. Left main trunk: Medium caliber vessel. This is a long vessel with a  distal taper of 20-30%.  2. Left anterior descending artery: This is  a medium caliber vessel that  provides a large bifurcating first diagonal Sayvon Arterberry in the proximal segment  and before then extending to the apex, the LAD tapers significantly after  the diagonal Treysen Sudbeck. There is moderate disease of 50-60% after the  diagonal Waldon Sheerin and then a focal eccentric narrowing of 60-70% in the mid  section of the LAD. The diagonal Travas Schexnayder has moderate disease of 30-40% in  the proximal segment. The lateral division is a small caliber vessel with  an ostial narrowing of 60%.  3. Left circumflex artery: This is a small caliber vessel that provides a  trivial first marginal Aluel Schwarz in the mid section and a medium caliber  second marginal Snyder Colavito distally. The mid AV circumflex at the takeoff of  the first and second diagonal branches had moderate diffuse disease of  60-70%.  4. Right coronary artery: Dominant. This is a large caliber vessel that  provides a posterior descending artery and two posteroventricular branches  in the terminal segment. The right coronary artery has evidence of an  existing stent in the mid section that is widely patent. Prior to stent is  a focal discrete narrowing of 40-50%. Distal to the stent is moderate  disease of 30%. The distal Maniah Nading vessels also have mild disease of 30%.  5. Left ventricle: Normal end-systolic and end-diastolic dimensions. Overall  left ventricular function is well preserved. Ejection fraction is greater  than 65%. No mitral regurgitation. LV pressure is 180/10, aortic is  180/90. LV EDP equals 20.  ASSESSMENT AND PLAN: Mr. Wehner is a 69 year old gentleman with moderate  three-vessel coronary artery disease. The patients plaque burden  in the left  anterior descending artery and circumflex appears to have increased somewhat  compared to his previous angiogram. Unfortunately, the mid and distal left  anterior descending artery is a small caliber vessel with a long diffuse  segment of disease  and would be associated with a high restenosis rate.  Similarly, the circumflex would suffer the same fate.  Further assessment with a stress imaging study would be prudent to isolate the  area of ischemia. Percutaneous intervention may then be targeted if  indicated. In addition, aggressive medical therapy should be pursued as the  patient currently is not on any anti-anginal therapy and has poorly controlled  hypertension. The patient will also be counseled regarding smoking cessation.    07/2013 PFTs Mild obstruction    10/2014 Lexiscan  Unable to adequately ambulate to treadmill due to right hip pain. Lexiscan employed.   Defect 1: There is a small defect of mild severity present in the mid inferior and apical inferior location. This defect is partially reversible.   This is a low risk study.   Nuclear stress EF: 58%.   Small region of apical inferior ischemia    07/2015 Echo Study Conclusions  - Left ventricle: The cavity size was normal. Wall thickness was  increased increased in a pattern of mild to moderate LVH.  Systolic function was normal. The estimated ejection fraction was  in the range of 60% to 65%. Diastolic function is abnormal,  indeterminate grade. Wall motion was normal; there were no  regional wall motion abnormalities. - Aortic valve: Mildly calcified annulus. Trileaflet; mildly  thickened leaflets. Valve area (VTI): 2.39 cm^2. Valve area  (Vmax): 2.58 cm^2. Valve area (Vmean): 2.7 cm^2. - Mitral valve: Mildly calcified annulus. Normal thickness leaflets  . - Technically adequate study.  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 Recommend followup by ultrasound in 2 years. This recommendation follows ACR consensus guidelines:   Jan 2019 nuclear stress  T wave inversions in inferior leads and nonspecific T wave  abnormalities in V6 seen throughout study.  The study is normal. No myocardial ischemia or scar.  This is a low risk study.  Nuclear stress EF: 56%.   07/2017 event monitor  14 day event monitor  Min HR 51, Max HR 115, Avg HR 69  Telemetry tracings show sinus rhythm and rate controlled atrial fibrillation  Reported symptoms correlated with rate controlled afib   07/2019 cath  Mid LM to Dist LM lesion is 25% stenosed.  Mid LAD lesion is 40% stenosed.  1st Diag-1 lesion is 80% stenosed.  1st Diag-2 lesion is 90% stenosed.  1st Diag-3 lesion is 90% stenosed.  Ost Cx to Prox Cx lesion is 75% stenosed.  Mid Cx lesion is 90% stenosed.  Prox RCA lesion is 35% stenosed.  Dist RCA lesion is 50% stenosed.  The left ventricular systolic function is normal.  LV end diastolic pressure is normal.  The left ventricular ejection fraction is 55-65% by visual estimate.   1. Severe 2 vessel obstructive CAD. The RCA and LAD disease is stable compared to 2003. There is progressive disease in a large diagonal Khaidyn Staebell and the LCx    - the diagonal is a large bifurcating vessel.  It has multiple severe stenoses in the main Naethan Bracewell with severely calcified lesions    - 75% proximal LCx and 90% mid LCx. There is an acutely angulated take  off from the left main and the vessel is severely calcified. 2. Normal LV function 3. Normal LVEDP  Plan: discussed with Dr Burt Knack. The diagonal is poorly suited for PCI due to diffuse and heavily calcified disease. The LCx is complex and would require atherectomy. The acute angulation would make it technically difficult. For now would recommend aggressive medical therapy     Assessment and Plan  1.Paroxysmal aflutter/Afib/ Tachybrady syndrome -off anticoag due to prior GI bleeding - continuecurrent meds  2. HTN -issues with low bp's during HD, continue current meds  3. AAA - continue to monitir   3. CAD  -continue current meds, still  with some symptoms during HD particularly when bp's get low  4. Hyperlipidemia  - he will continue statin         Arnoldo Lenis, M.D.

## 2019-08-23 NOTE — Patient Instructions (Signed)
Medication Instructions:  Your physician recommends that you continue on your current medications as directed. Please refer to the Current Medication list given to you today.  *If you need a refill on your cardiac medications before your next appointment, please call your pharmacy*   Lab Work: NONE TODAY If you have labs (blood work) drawn today and your tests are completely normal, you will receive your results only by: . MyChart Message (if you have MyChart) OR . A paper copy in the mail If you have any lab test that is abnormal or we need to change your treatment, we will call you to review the results.   Testing/Procedures: NONE TODAY   Follow-Up: At [facility], you and your health needs are our priority.  As part of our continuing mission to provide you with exceptional heart care, we have created designated Provider Care Teams.  These Care Teams include your primary Cardiologist (physician) and Advanced Practice Providers (APPs -  Physician Assistants and Nurse Practitioners) who all work together to provide you with the care you need, when you need it.  We recommend signing up for the patient portal called "MyChart".  Sign up information is provided on this After Visit Summary.  MyChart is used to connect with patients for Virtual Visits (Telemedicine).  Patients are able to view lab/test results, encounter notes, upcoming appointments, etc.  Non-urgent messages can be sent to your provider as well.   To learn more about what you can do with MyChart, go to [email or web address].    Your next appointment:   12 month(s)  The format for your next appointment:   In Person  Provider:   Jonathan Branch, MD   Other Instructions NONE     Thank you for choosing [facility] !         

## 2019-08-24 DIAGNOSIS — N2581 Secondary hyperparathyroidism of renal origin: Secondary | ICD-10-CM | POA: Diagnosis not present

## 2019-08-24 DIAGNOSIS — N185 Chronic kidney disease, stage 5: Secondary | ICD-10-CM | POA: Diagnosis not present

## 2019-08-24 DIAGNOSIS — Z992 Dependence on renal dialysis: Secondary | ICD-10-CM | POA: Diagnosis not present

## 2019-08-24 DIAGNOSIS — R112 Nausea with vomiting, unspecified: Secondary | ICD-10-CM | POA: Diagnosis not present

## 2019-08-24 DIAGNOSIS — E1122 Type 2 diabetes mellitus with diabetic chronic kidney disease: Secondary | ICD-10-CM | POA: Diagnosis not present

## 2019-08-24 DIAGNOSIS — E039 Hypothyroidism, unspecified: Secondary | ICD-10-CM | POA: Diagnosis not present

## 2019-08-24 DIAGNOSIS — R519 Headache, unspecified: Secondary | ICD-10-CM | POA: Diagnosis not present

## 2019-08-24 DIAGNOSIS — N186 End stage renal disease: Secondary | ICD-10-CM | POA: Diagnosis not present

## 2019-08-24 DIAGNOSIS — I214 Non-ST elevation (NSTEMI) myocardial infarction: Secondary | ICD-10-CM | POA: Diagnosis not present

## 2019-08-24 DIAGNOSIS — J438 Other emphysema: Secondary | ICD-10-CM | POA: Diagnosis not present

## 2019-08-24 DIAGNOSIS — D509 Iron deficiency anemia, unspecified: Secondary | ICD-10-CM | POA: Diagnosis not present

## 2019-08-24 DIAGNOSIS — K219 Gastro-esophageal reflux disease without esophagitis: Secondary | ICD-10-CM | POA: Diagnosis not present

## 2019-08-26 DIAGNOSIS — D509 Iron deficiency anemia, unspecified: Secondary | ICD-10-CM | POA: Diagnosis not present

## 2019-08-26 DIAGNOSIS — Z992 Dependence on renal dialysis: Secondary | ICD-10-CM | POA: Diagnosis not present

## 2019-08-26 DIAGNOSIS — N186 End stage renal disease: Secondary | ICD-10-CM | POA: Diagnosis not present

## 2019-08-26 DIAGNOSIS — N2581 Secondary hyperparathyroidism of renal origin: Secondary | ICD-10-CM | POA: Diagnosis not present

## 2019-08-26 DIAGNOSIS — R112 Nausea with vomiting, unspecified: Secondary | ICD-10-CM | POA: Diagnosis not present

## 2019-08-28 DIAGNOSIS — N186 End stage renal disease: Secondary | ICD-10-CM | POA: Diagnosis not present

## 2019-08-28 DIAGNOSIS — R112 Nausea with vomiting, unspecified: Secondary | ICD-10-CM | POA: Diagnosis not present

## 2019-08-28 DIAGNOSIS — D509 Iron deficiency anemia, unspecified: Secondary | ICD-10-CM | POA: Diagnosis not present

## 2019-08-28 DIAGNOSIS — N2581 Secondary hyperparathyroidism of renal origin: Secondary | ICD-10-CM | POA: Diagnosis not present

## 2019-08-28 DIAGNOSIS — Z992 Dependence on renal dialysis: Secondary | ICD-10-CM | POA: Diagnosis not present

## 2019-08-30 DIAGNOSIS — B078 Other viral warts: Secondary | ICD-10-CM | POA: Diagnosis not present

## 2019-08-30 DIAGNOSIS — D485 Neoplasm of uncertain behavior of skin: Secondary | ICD-10-CM | POA: Diagnosis not present

## 2019-08-30 DIAGNOSIS — C44729 Squamous cell carcinoma of skin of left lower limb, including hip: Secondary | ICD-10-CM | POA: Diagnosis not present

## 2019-08-30 DIAGNOSIS — S91104A Unspecified open wound of right lesser toe(s) without damage to nail, initial encounter: Secondary | ICD-10-CM | POA: Diagnosis not present

## 2019-08-30 DIAGNOSIS — C44329 Squamous cell carcinoma of skin of other parts of face: Secondary | ICD-10-CM | POA: Diagnosis not present

## 2019-08-30 DIAGNOSIS — E1142 Type 2 diabetes mellitus with diabetic polyneuropathy: Secondary | ICD-10-CM | POA: Diagnosis not present

## 2019-08-31 DIAGNOSIS — N186 End stage renal disease: Secondary | ICD-10-CM | POA: Diagnosis not present

## 2019-08-31 DIAGNOSIS — N2581 Secondary hyperparathyroidism of renal origin: Secondary | ICD-10-CM | POA: Diagnosis not present

## 2019-08-31 DIAGNOSIS — D509 Iron deficiency anemia, unspecified: Secondary | ICD-10-CM | POA: Diagnosis not present

## 2019-08-31 DIAGNOSIS — Z992 Dependence on renal dialysis: Secondary | ICD-10-CM | POA: Diagnosis not present

## 2019-08-31 DIAGNOSIS — R112 Nausea with vomiting, unspecified: Secondary | ICD-10-CM | POA: Diagnosis not present

## 2019-09-01 DIAGNOSIS — Z4902 Encounter for fitting and adjustment of peritoneal dialysis catheter: Secondary | ICD-10-CM | POA: Diagnosis not present

## 2019-09-01 DIAGNOSIS — Z01818 Encounter for other preprocedural examination: Secondary | ICD-10-CM | POA: Diagnosis not present

## 2019-09-01 DIAGNOSIS — N186 End stage renal disease: Secondary | ICD-10-CM | POA: Diagnosis not present

## 2019-09-01 DIAGNOSIS — I120 Hypertensive chronic kidney disease with stage 5 chronic kidney disease or end stage renal disease: Secondary | ICD-10-CM | POA: Diagnosis not present

## 2019-09-01 DIAGNOSIS — E1122 Type 2 diabetes mellitus with diabetic chronic kidney disease: Secondary | ICD-10-CM | POA: Diagnosis not present

## 2019-09-01 DIAGNOSIS — Z992 Dependence on renal dialysis: Secondary | ICD-10-CM | POA: Diagnosis not present

## 2019-09-01 DIAGNOSIS — I214 Non-ST elevation (NSTEMI) myocardial infarction: Secondary | ICD-10-CM | POA: Diagnosis not present

## 2019-09-02 DIAGNOSIS — Z992 Dependence on renal dialysis: Secondary | ICD-10-CM | POA: Diagnosis not present

## 2019-09-02 DIAGNOSIS — N186 End stage renal disease: Secondary | ICD-10-CM | POA: Diagnosis not present

## 2019-09-02 DIAGNOSIS — D509 Iron deficiency anemia, unspecified: Secondary | ICD-10-CM | POA: Diagnosis not present

## 2019-09-02 DIAGNOSIS — R112 Nausea with vomiting, unspecified: Secondary | ICD-10-CM | POA: Diagnosis not present

## 2019-09-02 DIAGNOSIS — N2581 Secondary hyperparathyroidism of renal origin: Secondary | ICD-10-CM | POA: Diagnosis not present

## 2019-09-04 DIAGNOSIS — N2581 Secondary hyperparathyroidism of renal origin: Secondary | ICD-10-CM | POA: Diagnosis not present

## 2019-09-04 DIAGNOSIS — N186 End stage renal disease: Secondary | ICD-10-CM | POA: Diagnosis not present

## 2019-09-04 DIAGNOSIS — R112 Nausea with vomiting, unspecified: Secondary | ICD-10-CM | POA: Diagnosis not present

## 2019-09-04 DIAGNOSIS — Z992 Dependence on renal dialysis: Secondary | ICD-10-CM | POA: Diagnosis not present

## 2019-09-04 DIAGNOSIS — D509 Iron deficiency anemia, unspecified: Secondary | ICD-10-CM | POA: Diagnosis not present

## 2019-09-07 DIAGNOSIS — N186 End stage renal disease: Secondary | ICD-10-CM | POA: Diagnosis not present

## 2019-09-07 DIAGNOSIS — D509 Iron deficiency anemia, unspecified: Secondary | ICD-10-CM | POA: Diagnosis not present

## 2019-09-07 DIAGNOSIS — N2581 Secondary hyperparathyroidism of renal origin: Secondary | ICD-10-CM | POA: Diagnosis not present

## 2019-09-07 DIAGNOSIS — Z992 Dependence on renal dialysis: Secondary | ICD-10-CM | POA: Diagnosis not present

## 2019-09-07 DIAGNOSIS — R112 Nausea with vomiting, unspecified: Secondary | ICD-10-CM | POA: Diagnosis not present

## 2019-09-09 DIAGNOSIS — Z992 Dependence on renal dialysis: Secondary | ICD-10-CM | POA: Diagnosis not present

## 2019-09-09 DIAGNOSIS — R112 Nausea with vomiting, unspecified: Secondary | ICD-10-CM | POA: Diagnosis not present

## 2019-09-09 DIAGNOSIS — N2581 Secondary hyperparathyroidism of renal origin: Secondary | ICD-10-CM | POA: Diagnosis not present

## 2019-09-09 DIAGNOSIS — N186 End stage renal disease: Secondary | ICD-10-CM | POA: Diagnosis not present

## 2019-09-09 DIAGNOSIS — D509 Iron deficiency anemia, unspecified: Secondary | ICD-10-CM | POA: Diagnosis not present

## 2019-09-11 DIAGNOSIS — D509 Iron deficiency anemia, unspecified: Secondary | ICD-10-CM | POA: Diagnosis not present

## 2019-09-11 DIAGNOSIS — Z992 Dependence on renal dialysis: Secondary | ICD-10-CM | POA: Diagnosis not present

## 2019-09-11 DIAGNOSIS — N186 End stage renal disease: Secondary | ICD-10-CM | POA: Diagnosis not present

## 2019-09-11 DIAGNOSIS — R112 Nausea with vomiting, unspecified: Secondary | ICD-10-CM | POA: Diagnosis not present

## 2019-09-11 DIAGNOSIS — N2581 Secondary hyperparathyroidism of renal origin: Secondary | ICD-10-CM | POA: Diagnosis not present

## 2019-09-13 ENCOUNTER — Telehealth: Payer: Self-pay | Admitting: Cardiology

## 2019-09-13 DIAGNOSIS — E1142 Type 2 diabetes mellitus with diabetic polyneuropathy: Secondary | ICD-10-CM | POA: Diagnosis not present

## 2019-09-13 DIAGNOSIS — L97529 Non-pressure chronic ulcer of other part of left foot with unspecified severity: Secondary | ICD-10-CM | POA: Diagnosis not present

## 2019-09-14 ENCOUNTER — Telehealth: Payer: Self-pay

## 2019-09-14 DIAGNOSIS — R112 Nausea with vomiting, unspecified: Secondary | ICD-10-CM | POA: Diagnosis not present

## 2019-09-14 DIAGNOSIS — N186 End stage renal disease: Secondary | ICD-10-CM | POA: Diagnosis not present

## 2019-09-14 DIAGNOSIS — N2581 Secondary hyperparathyroidism of renal origin: Secondary | ICD-10-CM | POA: Diagnosis not present

## 2019-09-14 DIAGNOSIS — R109 Unspecified abdominal pain: Secondary | ICD-10-CM | POA: Diagnosis not present

## 2019-09-14 DIAGNOSIS — Z992 Dependence on renal dialysis: Secondary | ICD-10-CM | POA: Diagnosis not present

## 2019-09-14 DIAGNOSIS — D509 Iron deficiency anemia, unspecified: Secondary | ICD-10-CM | POA: Diagnosis not present

## 2019-09-14 NOTE — Telephone Encounter (Signed)
Spoke to [employer] from [facility] regarding pt having access issues. Pt's access was placed in 04/2019. We discussed this would likely need to go through IR. Pam verbalized understanding and will call us back if she needs anything further.

## 2019-09-16 ENCOUNTER — Other Ambulatory Visit (HOSPITAL_COMMUNITY): Payer: Self-pay | Admitting: Nephrology

## 2019-09-16 ENCOUNTER — Other Ambulatory Visit: Payer: Self-pay

## 2019-09-16 DIAGNOSIS — N2581 Secondary hyperparathyroidism of renal origin: Secondary | ICD-10-CM | POA: Diagnosis not present

## 2019-09-16 DIAGNOSIS — D509 Iron deficiency anemia, unspecified: Secondary | ICD-10-CM | POA: Diagnosis not present

## 2019-09-16 DIAGNOSIS — Z992 Dependence on renal dialysis: Secondary | ICD-10-CM | POA: Diagnosis not present

## 2019-09-16 DIAGNOSIS — R112 Nausea with vomiting, unspecified: Secondary | ICD-10-CM | POA: Diagnosis not present

## 2019-09-16 DIAGNOSIS — N186 End stage renal disease: Secondary | ICD-10-CM

## 2019-09-16 NOTE — Telephone Encounter (Signed)
ERROR

## 2019-09-17 DIAGNOSIS — N186 End stage renal disease: Secondary | ICD-10-CM | POA: Diagnosis not present

## 2019-09-18 DIAGNOSIS — Z992 Dependence on renal dialysis: Secondary | ICD-10-CM | POA: Diagnosis not present

## 2019-09-18 DIAGNOSIS — R112 Nausea with vomiting, unspecified: Secondary | ICD-10-CM | POA: Diagnosis not present

## 2019-09-18 DIAGNOSIS — D509 Iron deficiency anemia, unspecified: Secondary | ICD-10-CM | POA: Diagnosis not present

## 2019-09-18 DIAGNOSIS — N186 End stage renal disease: Secondary | ICD-10-CM | POA: Diagnosis not present

## 2019-09-18 DIAGNOSIS — N2581 Secondary hyperparathyroidism of renal origin: Secondary | ICD-10-CM | POA: Diagnosis not present

## 2019-09-20 ENCOUNTER — Other Ambulatory Visit (HOSPITAL_COMMUNITY): Payer: Self-pay | Admitting: Nephrology

## 2019-09-20 ENCOUNTER — Other Ambulatory Visit: Payer: Self-pay

## 2019-09-20 ENCOUNTER — Other Ambulatory Visit (HOSPITAL_COMMUNITY)
Admission: RE | Admit: 2019-09-20 | Discharge: 2019-09-20 | Disposition: A | Discharge status: Home / Self Care | Payer: Medicare Other | Source: Ambulatory Visit | Attending: Vascular Surgery | Admitting: Vascular Surgery

## 2019-09-20 DIAGNOSIS — N186 End stage renal disease: Secondary | ICD-10-CM

## 2019-09-20 DIAGNOSIS — Z79899 Other long term (current) drug therapy: Secondary | ICD-10-CM | POA: Diagnosis not present

## 2019-09-20 DIAGNOSIS — G44211 Episodic tension-type headache, intractable: Secondary | ICD-10-CM | POA: Diagnosis not present

## 2019-09-20 DIAGNOSIS — Z20822 Contact with and (suspected) exposure to covid-19: Secondary | ICD-10-CM | POA: Insufficient documentation

## 2019-09-20 DIAGNOSIS — Z01812 Encounter for preprocedural laboratory examination: Secondary | ICD-10-CM | POA: Diagnosis not present

## 2019-09-20 DIAGNOSIS — G4489 Other headache syndrome: Secondary | ICD-10-CM | POA: Diagnosis not present

## 2019-09-20 LAB — SARS CORONAVIRUS 2 (TAT 6-24 HRS): SARS Coronavirus 2: NEGATIVE

## 2019-09-21 ENCOUNTER — Other Ambulatory Visit: Payer: Self-pay | Admitting: Radiology

## 2019-09-21 DIAGNOSIS — Z992 Dependence on renal dialysis: Secondary | ICD-10-CM | POA: Diagnosis not present

## 2019-09-21 DIAGNOSIS — D509 Iron deficiency anemia, unspecified: Secondary | ICD-10-CM | POA: Diagnosis not present

## 2019-09-21 DIAGNOSIS — N2581 Secondary hyperparathyroidism of renal origin: Secondary | ICD-10-CM | POA: Diagnosis not present

## 2019-09-21 DIAGNOSIS — R112 Nausea with vomiting, unspecified: Secondary | ICD-10-CM | POA: Diagnosis not present

## 2019-09-21 DIAGNOSIS — N186 End stage renal disease: Secondary | ICD-10-CM | POA: Diagnosis not present

## 2019-09-22 ENCOUNTER — Encounter (HOSPITAL_COMMUNITY): Payer: Self-pay

## 2019-09-22 ENCOUNTER — Other Ambulatory Visit: Payer: Self-pay

## 2019-09-22 ENCOUNTER — Ambulatory Visit (HOSPITAL_COMMUNITY)
Admission: RE | Admit: 2019-09-22 | Discharge: 2019-09-22 | Disposition: A | Discharge status: Home / Self Care | Payer: Medicare Other | Attending: Vascular Surgery | Admitting: Vascular Surgery

## 2019-09-22 ENCOUNTER — Ambulatory Visit (HOSPITAL_COMMUNITY): Admission: RE | Admit: 2019-09-22 | Payer: Medicare Other | Source: Ambulatory Visit

## 2019-09-22 ENCOUNTER — Ambulatory Visit (HOSPITAL_COMMUNITY)
Admission: RE | Admit: 2019-09-22 | Discharge: 2019-09-22 | Disposition: A | Discharge status: Home / Self Care | Payer: Medicare Other | Source: Ambulatory Visit | Attending: Nephrology | Admitting: Nephrology

## 2019-09-22 ENCOUNTER — Encounter (HOSPITAL_COMMUNITY)
Admission: RE | Disposition: A | Discharge status: Home / Self Care | Payer: Self-pay | Source: Home / Self Care | Attending: Vascular Surgery

## 2019-09-22 DIAGNOSIS — T82510A Breakdown (mechanical) of surgically created arteriovenous fistula, initial encounter: Secondary | ICD-10-CM | POA: Insufficient documentation

## 2019-09-22 DIAGNOSIS — Z992 Dependence on renal dialysis: Secondary | ICD-10-CM | POA: Insufficient documentation

## 2019-09-22 DIAGNOSIS — N186 End stage renal disease: Secondary | ICD-10-CM | POA: Diagnosis not present

## 2019-09-22 DIAGNOSIS — Z7951 Long term (current) use of inhaled steroids: Secondary | ICD-10-CM | POA: Diagnosis not present

## 2019-09-22 DIAGNOSIS — I2510 Atherosclerotic heart disease of native coronary artery without angina pectoris: Secondary | ICD-10-CM | POA: Diagnosis not present

## 2019-09-22 DIAGNOSIS — Z833 Family history of diabetes mellitus: Secondary | ICD-10-CM | POA: Insufficient documentation

## 2019-09-22 DIAGNOSIS — I50812 Chronic right heart failure: Secondary | ICD-10-CM | POA: Diagnosis not present

## 2019-09-22 DIAGNOSIS — I714 Abdominal aortic aneurysm, without rupture: Secondary | ICD-10-CM | POA: Diagnosis not present

## 2019-09-22 DIAGNOSIS — Y832 Surgical operation with anastomosis, bypass or graft as the cause of abnormal reaction of the patient, or of later complication, without mention of misadventure at the time of the procedure: Secondary | ICD-10-CM | POA: Diagnosis not present

## 2019-09-22 DIAGNOSIS — F17210 Nicotine dependence, cigarettes, uncomplicated: Secondary | ICD-10-CM | POA: Diagnosis not present

## 2019-09-22 DIAGNOSIS — Z7982 Long term (current) use of aspirin: Secondary | ICD-10-CM | POA: Insufficient documentation

## 2019-09-22 DIAGNOSIS — I739 Peripheral vascular disease, unspecified: Secondary | ICD-10-CM | POA: Insufficient documentation

## 2019-09-22 DIAGNOSIS — I872 Venous insufficiency (chronic) (peripheral): Secondary | ICD-10-CM | POA: Insufficient documentation

## 2019-09-22 DIAGNOSIS — G4730 Sleep apnea, unspecified: Secondary | ICD-10-CM | POA: Diagnosis not present

## 2019-09-22 DIAGNOSIS — E785 Hyperlipidemia, unspecified: Secondary | ICD-10-CM | POA: Diagnosis not present

## 2019-09-22 DIAGNOSIS — I132 Hypertensive heart and chronic kidney disease with heart failure and with stage 5 chronic kidney disease, or end stage renal disease: Secondary | ICD-10-CM | POA: Diagnosis not present

## 2019-09-22 DIAGNOSIS — Z95828 Presence of other vascular implants and grafts: Secondary | ICD-10-CM | POA: Diagnosis not present

## 2019-09-22 DIAGNOSIS — Z79899 Other long term (current) drug therapy: Secondary | ICD-10-CM | POA: Diagnosis not present

## 2019-09-22 DIAGNOSIS — T82898A Other specified complication of vascular prosthetic devices, implants and grafts, initial encounter: Secondary | ICD-10-CM | POA: Diagnosis not present

## 2019-09-22 DIAGNOSIS — E1122 Type 2 diabetes mellitus with diabetic chronic kidney disease: Secondary | ICD-10-CM | POA: Insufficient documentation

## 2019-09-22 DIAGNOSIS — J449 Chronic obstructive pulmonary disease, unspecified: Secondary | ICD-10-CM | POA: Diagnosis not present

## 2019-09-22 DIAGNOSIS — I4891 Unspecified atrial fibrillation: Secondary | ICD-10-CM | POA: Insufficient documentation

## 2019-09-22 HISTORY — PX: DIALYSIS/PERMA CATHETER REMOVAL: CATH118289

## 2019-09-22 HISTORY — PX: A/V FISTULAGRAM: CATH118298

## 2019-09-22 LAB — POCT I-STAT, CHEM 8
BUN: 40 mg/dL — ABNORMAL HIGH (ref 8–23)
Calcium, Ion: 1.09 mmol/L — ABNORMAL LOW (ref 1.15–1.40)
Chloride: 94 mmol/L — ABNORMAL LOW (ref 98–111)
Creatinine, Ser: 6.6 mg/dL — ABNORMAL HIGH (ref 0.61–1.24)
Glucose, Bld: 89 mg/dL (ref 70–99)
HCT: 45 % (ref 39.0–52.0)
Hemoglobin: 15.3 g/dL (ref 13.0–17.0)
Potassium: 4.2 mmol/L (ref 3.5–5.1)
Sodium: 134 mmol/L — ABNORMAL LOW (ref 135–145)
TCO2: 29 mmol/L (ref 22–32)

## 2019-09-22 SURGERY — A/V FISTULAGRAM
Anesthesia: LOCAL

## 2019-09-22 MED ORDER — LIDOCAINE HCL (PF) 1 % IJ SOLN
INTRAMUSCULAR | Status: AC
  Filled 2019-09-22: qty 30

## 2019-09-22 MED ORDER — SODIUM CHLORIDE 0.9% FLUSH: 3.0000 mL | INTRAVENOUS | Status: DC | PRN

## 2019-09-22 MED ORDER — LIDOCAINE HCL (PF) 1 % IJ SOLN
INTRAMUSCULAR | Status: DC | PRN
  Administered 2019-09-22: 5 mL via SUBCUTANEOUS
  Administered 2019-09-22: 10 mL via SUBCUTANEOUS

## 2019-09-22 MED ORDER — SODIUM CHLORIDE 0.9 % IV SOLN: 250.0000 mL | INTRAVENOUS | Status: DC | PRN

## 2019-09-22 MED ORDER — LIDOCAINE-EPINEPHRINE 1 %-1:100000 IJ SOLN
INTRAMUSCULAR | Status: AC
  Filled 2019-09-22: qty 1

## 2019-09-22 MED ORDER — MIDAZOLAM HCL 2 MG/2ML IJ SOLN
INTRAMUSCULAR | Status: AC
  Filled 2019-09-22: qty 2

## 2019-09-22 MED ORDER — FENTANYL CITRATE (PF) 100 MCG/2ML IJ SOLN
INTRAMUSCULAR | Status: DC | PRN
  Administered 2019-09-22: 25 ug via INTRAVENOUS

## 2019-09-22 MED ORDER — FENTANYL CITRATE (PF) 100 MCG/2ML IJ SOLN
INTRAMUSCULAR | Status: AC
  Filled 2019-09-22: qty 2

## 2019-09-22 MED ORDER — IODIXANOL 320 MG/ML IV SOLN
INTRAVENOUS | Status: DC | PRN
  Administered 2019-09-22: 13:00:00 30 mL

## 2019-09-22 MED ORDER — HEPARIN (PORCINE) IN NACL 1000-0.9 UT/500ML-% IV SOLN
INTRAVENOUS | Status: AC
  Filled 2019-09-22: qty 500

## 2019-09-22 MED ORDER — HEPARIN (PORCINE) IN NACL 1000-0.9 UT/500ML-% IV SOLN
INTRAVENOUS | Status: DC | PRN
  Administered 2019-09-22: 500 mL

## 2019-09-22 MED ORDER — SODIUM CHLORIDE 0.9% FLUSH: 3.0000 mL | Freq: Two times a day (BID) | INTRAVENOUS | Status: DC

## 2019-09-22 MED ORDER — MIDAZOLAM HCL 2 MG/2ML IJ SOLN
INTRAMUSCULAR | Status: DC | PRN
  Administered 2019-09-22: 1 mg via INTRAVENOUS

## 2019-09-22 SURGICAL SUPPLY — 9 items
BAG SNAP BAND KOVER 36X36 (MISCELLANEOUS) ×3 IMPLANT
COVER DOME SNAP 22 D (MISCELLANEOUS) ×3 IMPLANT
KIT MICROPUNCTURE NIT STIFF (SHEATH) ×1 IMPLANT
PROTECTION STATION PRESSURIZED (MISCELLANEOUS) ×3
SHEATH PROBE COVER 6X72 (BAG) ×3 IMPLANT
STATION PROTECTION PRESSURIZED (MISCELLANEOUS) ×2 IMPLANT
STOPCOCK MORSE 400PSI 3WAY (MISCELLANEOUS) ×3 IMPLANT
TRAY PV CATH (CUSTOM PROCEDURE TRAY) ×3 IMPLANT
TUBING CIL FLEX 10 FLL-RA (TUBING) ×3 IMPLANT

## 2019-09-22 NOTE — Discharge Instructions (Signed)

## 2019-09-22 NOTE — H&P (Signed)
H&P   History of Present Illness: This is a 69 y.o. male with end-stage renal disease the presents for malfunctioning left arm AV fistula.  Patient had a left brachiocephalic AV fistula placed on 05/06/2019 by myself.  He states dialysis nurses were pulling clot from the upper arm during dialysis.  No other pain or numbness tingling in the arm.  States his catheter should be removed today if the fistula is working.  Past Medical History:  Diagnosis Date  . AAA (abdominal aortic aneurysm) ([place]) 06/2016   Mild increase in size of 3.4 cm infrarenal abdominal aortic aneurysm  . Anemia   . Aortic atherosclerosis ([place])   . Asthma   . Atrial flutter ([place])   . AVF (arteriovenous fistula) (H[facility])    Left forearm  . CAD (coronary artery disease)    a. s/p prior stenting of RCA in 1999 b. cath in 2003 showing moderate CAD c. NST in 2016 showing small area of ischemic and low-risk  . CHF (congestive heart failure) ([place])   . CKD (chronic kidney disease), stage V ([place])    kidney transplant evaluation  . COPD (chronic obstructive pulmonary disease) ([place])    with ongoing tobacco use and patient failed sham takes  . Diverticulosis 2018   Mild sigmoid colon diverticulosis.  Marland Kitchen DM2 (diabetes mellitus, type 2) ([place])   . Dyslipidemia   . Dysrhythmia 2018   atrial fibrillation  . Edema    chronic lower extremity secondary to right heart failure and chronic venous insufficiency  . Fatigue   . Fatty liver 2010   Mild  . Headache   . HTN (hypertension)   . Morbid obesity ([place])   . Multiple pulmonary nodules 05/2018   Bilateral  . Pneumonia   . Presence of permanent cardiac pacemaker   . 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   Marland Kitchen Reflux esophagitis    hx  . Sleep apnea    PT STATES ONE TEST WAS POSITIVE AND ANOTHER TEST WAS NEGATIVE  . Two-vessel coronary artery disease    moderate. by cath in 2003. Status post stenting of the mdi RCA  November 1999 normal left ventricular ejection fraction  . Wears glasses   . Wears hearing aid    B/L    Past Surgical History:  Procedure Laterality Date  . A/V FISTULAGRAM Left 04/30/2019   Procedure: A/V FISTULAGRAM;  Surgeon: Elam Dutch, MD;  Location: [facility];  Service: Cardiovascular;  Laterality: Left;  . ABDOMINAL SURGERY    . APPENDECTOMY    . AV FISTULA PLACEMENT Right 03/11/2018   Procedure: CREATION Brachiocephalic Fistula RIGHT ARM;  Surgeon: Rosetta Posner, MD;  Location: [facility];  Service: Vascular;  Laterality: Right;  . AV FISTULA PLACEMENT Left 05/06/2019   Procedure: LEFT BRACHIOCEPHALIC ARTERIOVENOUS (AV) FISTULA CREATION;  Surgeon: Marty Heck, MD;  Location: [facility];  Service: Vascular;  Laterality: Left;  . BACK SURGERY     x3; cages in patient's back since 2000  . BIOPSY  11/12/2018   Procedure: BIOPSY;  Surgeon: Laurence Spates, MD;  Location: [facility];  Service: Endoscopy;;  . CARDIAC CATHETERIZATION     stent  . CHOLECYSTECTOMY    . CLOSED REDUCTION SHOULDER DISLOCATION    . COLONOSCOPY W/ BIOPSIES AND POLYPECTOMY    . COLONOSCOPY WITH PROPOFOL N/A 11/12/2018   Procedure: COLONOSCOPY WITH PROPOFOL;  Surgeon: Laurence Spates, MD;  Location: [facility];  Service:  Endoscopy;  Laterality: N/A;  . CORONARY ANGIOPLASTY    . diabetic ulcers    . DIAGNOSTIC LAPAROSCOPY    . ESOPHAGOGASTRODUODENOSCOPY (EGD) WITH PROPOFOL N/A 11/12/2018   Procedure: ESOPHAGOGASTRODUODENOSCOPY (EGD) WITH PROPOFOL;  Surgeon: Laurence Spates, MD;  [facility]: [facility];  Service: Endoscopy;  Laterality: N/A;  . GROIN EXPLORATION    . HEMOSTASIS CLIP PLACEMENT  11/12/2018   Procedure: HEMOSTASIS CLIP PLACEMENT;  Surgeon: Laurence Spates, MD;  [facility]: [facility];  Service: Endoscopy;;  . INSERT / REPLACE / REMOVE PACEMAKER    . INSERTION OF ARTERIOVENOUS (AV) ARTEGRAFT ARM Left 03/18/2018   Procedure: INSERTION OF ARTERIOVENOUS (AV) FISTULA;  Surgeon: Rosetta Posner, MD;  Location: [facility];  Service: Vascular;  Laterality: Left;  . IR FLUORO GUIDE CV LINE RIGHT  04/11/2019  . IR US GUIDE VASC ACCESS RIGHT  04/11/2019  . LEFT HEART CATH AND CORONARY ANGIOGRAPHY N/A 07/23/2019   Procedure: LEFT HEART CATH AND CORONARY ANGIOGRAPHY;  Surgeon: Martinique, Peter M, MD;  Location: [facility];  Service: Cardiovascular;  Laterality: N/A;  . LIGATION ARTERIOVENOUS GORTEX GRAFT Right 03/18/2018   Procedure: LIGATION ARTERIOVENOUS FISTULA;  Surgeon: Rosetta Posner, MD;  Location: [facility];  Service: Vascular;  Laterality: Right;  . OSTECTOMY Left 04/23/2017   Procedure: OSTECTOMY LEFT GREAT TOE;  Surgeon: [name], DPM;  Location: [facility];  Service: Podiatry;  Laterality: Left;  left great toe  . POLYPECTOMY  11/12/2018   Procedure: POLYPECTOMY;  Surgeon: Laurence Spates, MD;  Location: [facility];  Service: Endoscopy;;  . TUMOR REMOVAL     small intestine x3  . VISCERAL ANGIOGRAPHY N/A 08/02/2019   Procedure: VISCERAL ANGIOGRAPHY;  Surgeon: Waynetta Sandy, MD;  Location: [facility];  Service: Cardiovascular;  Laterality: N/A;    No Known Allergies  Prior to Admission medications   Medication Sig Start Date End Date Taking? Authorizing Provider  acetaminophen (TYLENOL) 500 MG tablet Take 1,000 mg by mouth in the morning and at bedtime.    Yes [provider]  albuterol (VENTOLIN HFA) 108 (90 BASE) MCG/ACT inhaler Inhale 2 puffs into the lungs every 6 (six) hours as needed for wheezing or shortness of breath.    Yes [provider]  aspirin EC 81 MG EC tablet Take 1 tablet (81 mg total) by mouth daily. 07/25/19  Yes Isaac Bliss, Rayford Halsted, MD  atorvastatin (LIPITOR) 40 MG tablet Take 1 tablet (40 mg total) by mouth daily. 08/23/19  Yes BranchAlphonse Guild, MD  beclomethasone (QVAR) 80 MCG/ACT inhaler Inhale 2 puffs into the lungs 2 (two) times daily as needed (shortness of breath).   Yes [provider]    bumetanide (BUMEX) 2 MG tablet Take 2 mg by mouth 2 (two) times daily. 07/15/19  Yes [provider]  buPROPion (WELLBUTRIN SR) 150 MG 12 hr tablet Take 150 mg by mouth 2 (two) times daily.   Yes [provider]  carvedilol (COREG) 25 MG tablet TAKE ONE TABLET BY MOUTH TWICE DAILY. Patient taking differently: Take 25 mg by mouth See admin instructions. Take 25 mg twice daily on Sun, Mon, Wed, and Fri. Take 25 mg in the evening on Tues, Thurs, and Sat (dialysis days) 02/15/19  Yes Branch, Alphonse Guild, MD  Cholecalciferol (VITAMIN D3) 2000 units TABS Take 2,000 Units by mouth daily.    Yes [provider]  diltiazem (CARDIZEM) 30 MG tablet TAKE ONE TABLET BY MOUTH TWICE DAILY. Patient taking differently: Take 30  mg by mouth 2 (two) times daily.  08/09/19  Yes BranchAlphonse Guild, MD  hydrALAZINE (APRESOLINE) 100 MG tablet Take 100 mg by mouth 3 (three) times daily. 08/09/19  Yes [provider]  isosorbide mononitrate (IMDUR) 30 MG 24 hr tablet Take 15 mg by mouth daily.    Yes [provider]  linaclotide (LINZESS) 290 MCG CAPS capsule Take 290 mcg by mouth daily as needed (constipation).   Yes [provider]  Melatonin 10 MG TABS Take 10 mg by mouth at bedtime as needed (sleep).    Yes [provider]  nitroGLYCERIN (NITROSTAT) 0.4 MG SL tablet Place 1 tablet (0.4 mg total) under the tongue every 5 (five) minutes as needed for chest pain. 08/04/19 11/02/19 Yes Branch, Alphonse Guild, MD  omeprazole (PRILOSEC) 20 MG capsule Take 20 mg by mouth daily.    Yes [provider]  polyethylene glycol (MIRALAX / GLYCOLAX) packet Take 17 g by mouth daily as needed for mild constipation. Mix in morning coffee   Yes [provider]  potassium chloride SA (KLOR-CON) 20 MEQ tablet Take 20 mEq by mouth daily. 08/09/19  Yes [provider]  Sennosides 25 MG TABS Take 50 mg by mouth daily as needed (constipation).    Yes [provider]  sevelamer carbonate (RENVELA) 800 MG tablet Take 800-1,600 mg by mouth See admin instructions. Take 1600 mg with each meal and 800 mg with each snack 07/26/19  Yes [provider]  silver sulfADIAZINE (SILVADENE) 1 % cream Apply 1 application topically daily as needed (For wound care with dressing).    Yes [provider]  Multiple Vitamin (MULTIVITAMIN WITH MINERALS) TABS tablet Take 1 tablet by mouth every morning.     [provider]  ranolazine (RANEXA) 500 MG 12 hr tablet Take 1 tablet (500 mg total) by mouth 2 (two) times daily. Patient not taking: Reported on 09/17/2019 08/09/19   Imogene Burn, PA-C    Social History   Socioeconomic History  . Marital status: Married    Spouse name: Not on file  . Number of children: 2  . Years of education: Not on file  . Highest education level: Not on file  Occupational History  . Occupation: retired Engineer, structural  Tobacco Use  . Smoking status: Current Every Day Smoker    Packs/day: 1.00    Years: 40.00    Pack years: 40.00    Types: Cigarettes    Start date: 05/20/1968  . Smokeless tobacco: Never Used  Substance and Sexual Activity  . Alcohol use: Not Currently    Alcohol/week: 0.0 standard drinks    Comment: rare  . Drug use: No  . Sexual activity: Yes  Other Topics Concern  . Not on file  Social History Narrative   Patient continues to smoke.    Social Determinants of Health   Financial Resource Strain:   . Difficulty of Paying Living Expenses:   Food Insecurity:   . Worried About Charity fundraiser in the Last Year:   . Arboriculturist in the Last Year:   Transportation Needs:   . Film/video editor (Medical):   Marland Kitchen Lack of Transportation (Non-Medical):   Physical Activity:   . Days of Exercise per Week:   . Minutes of Exercise per Session:   Stress:   . Feeling of Stress :   Social Connections:   . Frequency of Communication with Friends and Family:   . Frequency  of  Social Gatherings with Friends and Family:   . Attends Religious Services:   . Active Member of Clubs or Organizations:   . Attends Archivist Meetings:   Marland Kitchen Marital Status:   Intimate Partner Violence:   . Fear of Current or Ex-Partner:   . Emotionally Abused:   Marland Kitchen Physically Abused:   . Sexually Abused:      Family History  Problem Relation Age of Onset  . Diabetes Mother   . Alcohol abuse Father     ROS: [x]  Positive   [ ]  Negative   [ ]  All sytems reviewed and are negative  Cardiovascular: []  chest pain/pressure []  palpitations []  SOB lying flat []  DOE []  pain in legs while walking []  pain in legs at rest []  pain in legs at night []  non-healing ulcers []  hx of DVT []  swelling in legs  Pulmonary: []  productive cough []  asthma/wheezing []  home O2  Neurologic: []  weakness in []  arms []  legs []  numbness in []  arms []  legs []  hx of CVA []  mini stroke [] difficulty speaking or slurred speech []  temporary loss of vision in one eye []  dizziness  Hematologic: []  hx of cancer []  bleeding problems []  problems with blood clotting easily  Endocrine:   []  diabetes []  thyroid disease  GI []  vomiting blood []  blood in stool  GU: []  CKD/renal failure []  HD--[]  M/W/F or []  T/T/S []  burning with urination []  blood in urine  Psychiatric: []  anxiety []  depression  Musculoskeletal: []  arthritis []  joint pain  Integumentary: []  rashes []  ulcers  Constitutional: []  fever []  chills   Physical Examination  Vitals:   09/22/19 0930  BP: (!) 97/55  Pulse: (!) 58  Resp: 20  Temp: (!) 97.5 F (36.4 C)   Body mass index is 28.24 kg/m.  General:  WDWN in NAD Gait: Not observed HENT: WNL, normocephalic Pulmonary: normal non-labored breathing, without Rales, rhonchi,  wheezing Cardiac: regular, without  Murmurs, rubs or gallops Abdomen: soft, NT/ND, no masses Vascular Exam/Pulses: Left brachiocephalic AV fistula with good thrill, palpable  pulse at the wrist    CBC    Component Value Date/Time   WBC 8.2 07/25/2019 0328   RBC 3.47 (L) 07/25/2019 0328   HGB 15.3 09/22/2019 0911   HCT 45.0 09/22/2019 0911   PLT 149 (L) 07/25/2019 0328   MCV 102.9 (H) 07/25/2019 0328   MCH 34.3 (H) 07/25/2019 0328   MCHC 33.3 07/25/2019 0328   RDW 17.1 (H) 07/25/2019 0328   LYMPHSABS 1.0 04/10/2019 1244   MONOABS 0.7 04/10/2019 1244   EOSABS 0.1 04/10/2019 1244   BASOSABS 0.1 04/10/2019 1244    BMET    Component Value Date/Time   NA 134 (L) 09/22/2019 0911   NA 142 08/06/2017 1413   K 4.2 09/22/2019 0911   CL 94 (L) 09/22/2019 0911   CO2 29 07/24/2019 1956   GLUCOSE 89 09/22/2019 0911   BUN 40 (H) 09/22/2019 0911   BUN 55 (H) 08/06/2017 1413   CREATININE 6.60 (H) 09/22/2019 0911   CREATININE 2.01 (H) 07/28/2015 1606   CALCIUM 8.9 07/24/2019 1956   CALCIUM 8.3 (L) 04/09/2019 0951   GFRNONAA 11 (L) 07/24/2019 1956   GFRAA 13 (L) 07/24/2019 1956    COAGS: Lab Results  Component Value Date   INR 1.2 04/11/2019   INR 1.11 03/19/2018   INR 1.12 03/18/2018        ASSESSMENT/PLAN: This is a 69 y.o. male with end-stage renal disease  the presents with malfunctioning left arm brachiocephalic AV fistula.  We will plan left upper extremity fistulogram possible intervention.  He has a good thrill on exam.  If we get the fistula working well we will remove his tunneled dialysis catheter today.    Marty Heck, MD [facility] Office: [phone]

## 2019-09-22 NOTE — Op Note (Signed)
    OPERATIVE NOTE   PROCEDURE: 1. left brachiocephalic arteriovenous fistula cannulation under ultrasound guidance 2. left arm fistulogram including central venogram 3. removal of right internal jugular vein tunneled dialysis catheter  PRE-OPERATIVE DIAGNOSIS: Malfunctioning left arteriovenous fistula  POST-OPERATIVE DIAGNOSIS: same as above   SURGEON: Marty Heck, MD  ANESTHESIA: local  ESTIMATED BLOOD LOSS: 5 cc  FINDING(S): 1. The left brachiocephalic fistula had an excellent thrill even before intervention.  After ultrasound-guided access it is widely patent throughout the upper arm including no evidence of peripheral or central venous stenosis.  He does have a pacemaker on the left but all central veins appear patent around this.  He subsequently requested that we removed right IJ tunnel catheter which was performed at bedside in the [facility] lab.  SPECIMEN(S):  None  CONTRAST: 30 cc  INDICATIONS: [name] is a 69 y.o. male who  presents with malfunctioning left brachiocephalic arteriovenous fistula.  The patient is scheduled for left fistulogram.  The patient is aware the risks include but are not limited to: bleeding, infection, thrombosis of the cannulated access, and possible anaphylactic reaction to the contrast.  The patient is aware of the risks of the procedure and elects to proceed forward.  DESCRIPTION: After full informed written consent was obtained, the patient was brought back to the angiography suite and placed supine upon the angiography table.  The patient was connected to monitoring equipment.  The left arm was prepped and draped in the standard fashion for a left arm fistulogram.  Under ultrasound guidance, the fistula was evaluated, it was patent, an image was saved.  The fistula was cannulated with a micropuncture needle.  The microwire was advanced into the fistula and the needle was exchanged for the a microsheath, which was lodged 2 cm into the  access.  The wire was removed and the sheath was connected to the IV extension tubing.  Hand injections were completed to image the access from the antecubitum up to the level of axilla.  The central venous structures were also imaged by hand injections.  Based on the images, this patient will need: no intervention, fistula widely patent..  A 4-0 Monocryl purse-string suture was sewn around the sheath.  The sheath was removed while tying down the suture.  A sterile bandage was applied to the puncture site. We then turned our attention to the right neck that had already been prepped and draped in sterile fashion.  I used approximately 10 cc of 1% lidocaine to anesthetize around the catheter.  I then used a blunt hemostat and the cuff that was just under the skin was bluntly dissected freely.  I then was able to gently pull the catheter out in its entirety.  We held manual pressure for 5 minutes and a dry sterile dressing was applied on the chest wall.  COMPLICATIONS: None  CONDITION: Stable  Marty Heck, MD [facility] Office: [phone]  09/22/2019 1:23 PM

## 2019-09-23 ENCOUNTER — Other Ambulatory Visit: Payer: Self-pay | Admitting: Specialist

## 2019-09-23 DIAGNOSIS — N2581 Secondary hyperparathyroidism of renal origin: Secondary | ICD-10-CM | POA: Diagnosis not present

## 2019-09-23 DIAGNOSIS — D509 Iron deficiency anemia, unspecified: Secondary | ICD-10-CM | POA: Diagnosis not present

## 2019-09-23 DIAGNOSIS — R112 Nausea with vomiting, unspecified: Secondary | ICD-10-CM | POA: Diagnosis not present

## 2019-09-23 DIAGNOSIS — Z992 Dependence on renal dialysis: Secondary | ICD-10-CM | POA: Diagnosis not present

## 2019-09-23 DIAGNOSIS — M542 Cervicalgia: Secondary | ICD-10-CM

## 2019-09-23 DIAGNOSIS — N186 End stage renal disease: Secondary | ICD-10-CM | POA: Diagnosis not present

## 2019-09-25 DIAGNOSIS — R112 Nausea with vomiting, unspecified: Secondary | ICD-10-CM | POA: Diagnosis not present

## 2019-09-25 DIAGNOSIS — Z992 Dependence on renal dialysis: Secondary | ICD-10-CM | POA: Diagnosis not present

## 2019-09-25 DIAGNOSIS — N2581 Secondary hyperparathyroidism of renal origin: Secondary | ICD-10-CM | POA: Diagnosis not present

## 2019-09-25 DIAGNOSIS — D509 Iron deficiency anemia, unspecified: Secondary | ICD-10-CM | POA: Diagnosis not present

## 2019-09-25 DIAGNOSIS — N186 End stage renal disease: Secondary | ICD-10-CM | POA: Diagnosis not present

## 2019-09-28 ENCOUNTER — Ambulatory Visit
Admission: RE | Admit: 2019-09-28 | Discharge: 2019-09-28 | Disposition: A | Discharge status: Home / Self Care | Payer: Medicare Other | Source: Ambulatory Visit | Attending: Specialist | Admitting: Specialist

## 2019-09-28 DIAGNOSIS — N186 End stage renal disease: Secondary | ICD-10-CM | POA: Diagnosis not present

## 2019-09-28 DIAGNOSIS — R112 Nausea with vomiting, unspecified: Secondary | ICD-10-CM | POA: Diagnosis not present

## 2019-09-28 DIAGNOSIS — N2581 Secondary hyperparathyroidism of renal origin: Secondary | ICD-10-CM | POA: Diagnosis not present

## 2019-09-28 DIAGNOSIS — D509 Iron deficiency anemia, unspecified: Secondary | ICD-10-CM | POA: Diagnosis not present

## 2019-09-28 DIAGNOSIS — M542 Cervicalgia: Secondary | ICD-10-CM

## 2019-09-28 DIAGNOSIS — M4802 Spinal stenosis, cervical region: Secondary | ICD-10-CM | POA: Diagnosis not present

## 2019-09-28 DIAGNOSIS — Z992 Dependence on renal dialysis: Secondary | ICD-10-CM | POA: Diagnosis not present

## 2019-09-30 ENCOUNTER — Telehealth: Payer: Self-pay | Admitting: *Deleted

## 2019-09-30 DIAGNOSIS — N185 Chronic kidney disease, stage 5: Secondary | ICD-10-CM | POA: Diagnosis not present

## 2019-09-30 NOTE — Telephone Encounter (Signed)
   [facility] Pre-operative Risk Assessment    Request for surgical clearance:  1. What type of surgery is being performed? PORT-A-CATH PLACEMENT   2. When is this surgery scheduled? TBD   3. What type of clearance is required (medical clearance vs. Pharmacy clearance to hold med vs. Both)? MEDICAL  4. Are there any medications that need to be held prior to surgery and how long? ASA   5. Practice name and name of physician performing surgery? [facility]; DR. Bobbye Morton   6. What is your office phone number [phone]    7.   What is your office fax number [phone] ATTN: Emeline Gins, CMA  8.   Anesthesia type (None, local, MAC, general) ? GENERAL   Julaine Hua 09/30/2019, 2:34 PM  _________________________________________________________________   (provider comments below)

## 2019-10-01 DIAGNOSIS — R1110 Vomiting, unspecified: Secondary | ICD-10-CM | POA: Diagnosis not present

## 2019-10-01 DIAGNOSIS — M129 Arthropathy, unspecified: Secondary | ICD-10-CM | POA: Diagnosis not present

## 2019-10-01 DIAGNOSIS — M542 Cervicalgia: Secondary | ICD-10-CM | POA: Diagnosis not present

## 2019-10-01 DIAGNOSIS — Z6831 Body mass index (BMI) 31.0-31.9, adult: Secondary | ICD-10-CM | POA: Diagnosis not present

## 2019-10-01 NOTE — Telephone Encounter (Signed)
Ok to hold aspirin for procedure   Zandra Abts MD

## 2019-10-02 DIAGNOSIS — D509 Iron deficiency anemia, unspecified: Secondary | ICD-10-CM | POA: Diagnosis not present

## 2019-10-02 DIAGNOSIS — N2581 Secondary hyperparathyroidism of renal origin: Secondary | ICD-10-CM | POA: Diagnosis not present

## 2019-10-02 DIAGNOSIS — Z992 Dependence on renal dialysis: Secondary | ICD-10-CM | POA: Diagnosis not present

## 2019-10-02 DIAGNOSIS — R112 Nausea with vomiting, unspecified: Secondary | ICD-10-CM | POA: Diagnosis not present

## 2019-10-02 DIAGNOSIS — N186 End stage renal disease: Secondary | ICD-10-CM | POA: Diagnosis not present

## 2019-10-04 DIAGNOSIS — E1142 Type 2 diabetes mellitus with diabetic polyneuropathy: Secondary | ICD-10-CM | POA: Diagnosis not present

## 2019-10-04 DIAGNOSIS — M542 Cervicalgia: Secondary | ICD-10-CM | POA: Diagnosis not present

## 2019-10-04 DIAGNOSIS — G518 Other disorders of facial nerve: Secondary | ICD-10-CM | POA: Diagnosis not present

## 2019-10-04 DIAGNOSIS — L84 Corns and callosities: Secondary | ICD-10-CM | POA: Diagnosis not present

## 2019-10-04 DIAGNOSIS — M791 Myalgia, unspecified site: Secondary | ICD-10-CM | POA: Diagnosis not present

## 2019-10-04 DIAGNOSIS — G4489 Other headache syndrome: Secondary | ICD-10-CM | POA: Diagnosis not present

## 2019-10-04 DIAGNOSIS — G44211 Episodic tension-type headache, intractable: Secondary | ICD-10-CM | POA: Diagnosis not present

## 2019-10-04 NOTE — Telephone Encounter (Signed)
   Primary Cardiologist: Carlyle Dolly, MD  Chart reviewed as part of pre-operative protocol coverage. Given past medical history and time since last visit, based on ACC/AHA guidelines, [place] would be at acceptable risk for the planned procedure without further cardiovascular testing.   I will route this recommendation to the requesting party via Epic fax function and remove from pre-op pool.  Please call with questions.  [place], [place] 10/04/2019, 3:45 PM

## 2019-10-05 DIAGNOSIS — D509 Iron deficiency anemia, unspecified: Secondary | ICD-10-CM | POA: Diagnosis not present

## 2019-10-05 DIAGNOSIS — N2581 Secondary hyperparathyroidism of renal origin: Secondary | ICD-10-CM | POA: Diagnosis not present

## 2019-10-05 DIAGNOSIS — R112 Nausea with vomiting, unspecified: Secondary | ICD-10-CM | POA: Diagnosis not present

## 2019-10-05 DIAGNOSIS — N186 End stage renal disease: Secondary | ICD-10-CM | POA: Diagnosis not present

## 2019-10-05 DIAGNOSIS — Z992 Dependence on renal dialysis: Secondary | ICD-10-CM | POA: Diagnosis not present

## 2019-10-07 DIAGNOSIS — R112 Nausea with vomiting, unspecified: Secondary | ICD-10-CM | POA: Diagnosis not present

## 2019-10-07 DIAGNOSIS — N186 End stage renal disease: Secondary | ICD-10-CM | POA: Diagnosis not present

## 2019-10-07 DIAGNOSIS — N2581 Secondary hyperparathyroidism of renal origin: Secondary | ICD-10-CM | POA: Diagnosis not present

## 2019-10-07 DIAGNOSIS — D509 Iron deficiency anemia, unspecified: Secondary | ICD-10-CM | POA: Diagnosis not present

## 2019-10-07 DIAGNOSIS — Z992 Dependence on renal dialysis: Secondary | ICD-10-CM | POA: Diagnosis not present

## 2019-10-09 DIAGNOSIS — D509 Iron deficiency anemia, unspecified: Secondary | ICD-10-CM | POA: Diagnosis not present

## 2019-10-09 DIAGNOSIS — Z992 Dependence on renal dialysis: Secondary | ICD-10-CM | POA: Diagnosis not present

## 2019-10-09 DIAGNOSIS — R112 Nausea with vomiting, unspecified: Secondary | ICD-10-CM | POA: Diagnosis not present

## 2019-10-09 DIAGNOSIS — N186 End stage renal disease: Secondary | ICD-10-CM | POA: Diagnosis not present

## 2019-10-09 DIAGNOSIS — N2581 Secondary hyperparathyroidism of renal origin: Secondary | ICD-10-CM | POA: Diagnosis not present

## 2019-10-12 ENCOUNTER — Ambulatory Visit: Payer: Self-pay | Admitting: Surgery

## 2019-10-12 DIAGNOSIS — R112 Nausea with vomiting, unspecified: Secondary | ICD-10-CM | POA: Diagnosis not present

## 2019-10-12 DIAGNOSIS — E119 Type 2 diabetes mellitus without complications: Secondary | ICD-10-CM | POA: Diagnosis not present

## 2019-10-12 DIAGNOSIS — N2581 Secondary hyperparathyroidism of renal origin: Secondary | ICD-10-CM | POA: Diagnosis not present

## 2019-10-12 DIAGNOSIS — D509 Iron deficiency anemia, unspecified: Secondary | ICD-10-CM | POA: Diagnosis not present

## 2019-10-12 DIAGNOSIS — R109 Unspecified abdominal pain: Secondary | ICD-10-CM | POA: Diagnosis not present

## 2019-10-12 DIAGNOSIS — Z992 Dependence on renal dialysis: Secondary | ICD-10-CM | POA: Diagnosis not present

## 2019-10-12 DIAGNOSIS — N186 End stage renal disease: Secondary | ICD-10-CM | POA: Diagnosis not present

## 2019-10-14 DIAGNOSIS — R112 Nausea with vomiting, unspecified: Secondary | ICD-10-CM | POA: Diagnosis not present

## 2019-10-14 DIAGNOSIS — D509 Iron deficiency anemia, unspecified: Secondary | ICD-10-CM | POA: Diagnosis not present

## 2019-10-14 DIAGNOSIS — Z992 Dependence on renal dialysis: Secondary | ICD-10-CM | POA: Diagnosis not present

## 2019-10-14 DIAGNOSIS — N186 End stage renal disease: Secondary | ICD-10-CM | POA: Diagnosis not present

## 2019-10-14 DIAGNOSIS — N2581 Secondary hyperparathyroidism of renal origin: Secondary | ICD-10-CM | POA: Diagnosis not present

## 2019-10-15 DIAGNOSIS — K219 Gastro-esophageal reflux disease without esophagitis: Secondary | ICD-10-CM | POA: Diagnosis not present

## 2019-10-15 DIAGNOSIS — E782 Mixed hyperlipidemia: Secondary | ICD-10-CM | POA: Diagnosis not present

## 2019-10-15 DIAGNOSIS — E119 Type 2 diabetes mellitus without complications: Secondary | ICD-10-CM | POA: Diagnosis not present

## 2019-10-15 DIAGNOSIS — I10 Essential (primary) hypertension: Secondary | ICD-10-CM | POA: Diagnosis not present

## 2019-10-15 DIAGNOSIS — N185 Chronic kidney disease, stage 5: Secondary | ICD-10-CM | POA: Diagnosis not present

## 2019-10-16 DIAGNOSIS — D509 Iron deficiency anemia, unspecified: Secondary | ICD-10-CM | POA: Diagnosis not present

## 2019-10-16 DIAGNOSIS — Z992 Dependence on renal dialysis: Secondary | ICD-10-CM | POA: Diagnosis not present

## 2019-10-16 DIAGNOSIS — N186 End stage renal disease: Secondary | ICD-10-CM | POA: Diagnosis not present

## 2019-10-16 DIAGNOSIS — N2581 Secondary hyperparathyroidism of renal origin: Secondary | ICD-10-CM | POA: Diagnosis not present

## 2019-10-16 DIAGNOSIS — R112 Nausea with vomiting, unspecified: Secondary | ICD-10-CM | POA: Diagnosis not present

## 2019-10-18 DIAGNOSIS — N186 End stage renal disease: Secondary | ICD-10-CM | POA: Diagnosis not present

## 2019-10-19 DIAGNOSIS — D509 Iron deficiency anemia, unspecified: Secondary | ICD-10-CM | POA: Diagnosis not present

## 2019-10-19 DIAGNOSIS — N186 End stage renal disease: Secondary | ICD-10-CM | POA: Diagnosis not present

## 2019-10-19 DIAGNOSIS — R112 Nausea with vomiting, unspecified: Secondary | ICD-10-CM | POA: Diagnosis not present

## 2019-10-19 DIAGNOSIS — Z992 Dependence on renal dialysis: Secondary | ICD-10-CM | POA: Diagnosis not present

## 2019-10-21 DIAGNOSIS — R112 Nausea with vomiting, unspecified: Secondary | ICD-10-CM | POA: Diagnosis not present

## 2019-10-21 DIAGNOSIS — Z992 Dependence on renal dialysis: Secondary | ICD-10-CM | POA: Diagnosis not present

## 2019-10-21 DIAGNOSIS — D509 Iron deficiency anemia, unspecified: Secondary | ICD-10-CM | POA: Diagnosis not present

## 2019-10-21 DIAGNOSIS — N186 End stage renal disease: Secondary | ICD-10-CM | POA: Diagnosis not present

## 2019-10-22 DIAGNOSIS — K219 Gastro-esophageal reflux disease without esophagitis: Secondary | ICD-10-CM | POA: Diagnosis not present

## 2019-10-22 DIAGNOSIS — N529 Male erectile dysfunction, unspecified: Secondary | ICD-10-CM | POA: Diagnosis not present

## 2019-10-22 DIAGNOSIS — I4891 Unspecified atrial fibrillation: Secondary | ICD-10-CM | POA: Diagnosis not present

## 2019-10-22 DIAGNOSIS — I10 Essential (primary) hypertension: Secondary | ICD-10-CM | POA: Diagnosis not present

## 2019-10-22 DIAGNOSIS — Z6830 Body mass index (BMI) 30.0-30.9, adult: Secondary | ICD-10-CM | POA: Diagnosis not present

## 2019-10-22 DIAGNOSIS — E1122 Type 2 diabetes mellitus with diabetic chronic kidney disease: Secondary | ICD-10-CM | POA: Diagnosis not present

## 2019-10-22 DIAGNOSIS — J438 Other emphysema: Secondary | ICD-10-CM | POA: Diagnosis not present

## 2019-10-22 DIAGNOSIS — D649 Anemia, unspecified: Secondary | ICD-10-CM | POA: Diagnosis not present

## 2019-10-23 DIAGNOSIS — N186 End stage renal disease: Secondary | ICD-10-CM | POA: Diagnosis not present

## 2019-10-23 DIAGNOSIS — R112 Nausea with vomiting, unspecified: Secondary | ICD-10-CM | POA: Diagnosis not present

## 2019-10-23 DIAGNOSIS — Z992 Dependence on renal dialysis: Secondary | ICD-10-CM | POA: Diagnosis not present

## 2019-10-23 DIAGNOSIS — D509 Iron deficiency anemia, unspecified: Secondary | ICD-10-CM | POA: Diagnosis not present

## 2019-10-25 DIAGNOSIS — G44211 Episodic tension-type headache, intractable: Secondary | ICD-10-CM | POA: Diagnosis not present

## 2019-10-25 DIAGNOSIS — G518 Other disorders of facial nerve: Secondary | ICD-10-CM | POA: Diagnosis not present

## 2019-10-25 DIAGNOSIS — M791 Myalgia, unspecified site: Secondary | ICD-10-CM | POA: Diagnosis not present

## 2019-10-25 DIAGNOSIS — M542 Cervicalgia: Secondary | ICD-10-CM | POA: Diagnosis not present

## 2019-10-26 DIAGNOSIS — D509 Iron deficiency anemia, unspecified: Secondary | ICD-10-CM | POA: Diagnosis not present

## 2019-10-26 DIAGNOSIS — R112 Nausea with vomiting, unspecified: Secondary | ICD-10-CM | POA: Diagnosis not present

## 2019-10-26 DIAGNOSIS — N186 End stage renal disease: Secondary | ICD-10-CM | POA: Diagnosis not present

## 2019-10-26 DIAGNOSIS — Z992 Dependence on renal dialysis: Secondary | ICD-10-CM | POA: Diagnosis not present

## 2019-10-28 DIAGNOSIS — D509 Iron deficiency anemia, unspecified: Secondary | ICD-10-CM | POA: Diagnosis not present

## 2019-10-28 DIAGNOSIS — R112 Nausea with vomiting, unspecified: Secondary | ICD-10-CM | POA: Diagnosis not present

## 2019-10-28 DIAGNOSIS — N186 End stage renal disease: Secondary | ICD-10-CM | POA: Diagnosis not present

## 2019-10-28 DIAGNOSIS — Z992 Dependence on renal dialysis: Secondary | ICD-10-CM | POA: Diagnosis not present

## 2019-10-30 DIAGNOSIS — Z992 Dependence on renal dialysis: Secondary | ICD-10-CM | POA: Diagnosis not present

## 2019-10-30 DIAGNOSIS — R112 Nausea with vomiting, unspecified: Secondary | ICD-10-CM | POA: Diagnosis not present

## 2019-10-30 DIAGNOSIS — D509 Iron deficiency anemia, unspecified: Secondary | ICD-10-CM | POA: Diagnosis not present

## 2019-10-30 DIAGNOSIS — N186 End stage renal disease: Secondary | ICD-10-CM | POA: Diagnosis not present

## 2019-11-02 DIAGNOSIS — D509 Iron deficiency anemia, unspecified: Secondary | ICD-10-CM | POA: Diagnosis not present

## 2019-11-02 DIAGNOSIS — Z992 Dependence on renal dialysis: Secondary | ICD-10-CM | POA: Diagnosis not present

## 2019-11-02 DIAGNOSIS — R112 Nausea with vomiting, unspecified: Secondary | ICD-10-CM | POA: Diagnosis not present

## 2019-11-02 DIAGNOSIS — N186 End stage renal disease: Secondary | ICD-10-CM | POA: Diagnosis not present

## 2019-11-03 ENCOUNTER — Ambulatory Visit (INDEPENDENT_AMBULATORY_CARE_PROVIDER_SITE_OTHER): Payer: Medicare Other | Admitting: *Deleted

## 2019-11-03 DIAGNOSIS — R55 Syncope and collapse: Secondary | ICD-10-CM

## 2019-11-03 DIAGNOSIS — Z6829 Body mass index (BMI) 29.0-29.9, adult: Secondary | ICD-10-CM | POA: Diagnosis not present

## 2019-11-03 DIAGNOSIS — I700 Atherosclerosis of aorta: Secondary | ICD-10-CM | POA: Diagnosis not present

## 2019-11-03 DIAGNOSIS — N186 End stage renal disease: Secondary | ICD-10-CM | POA: Diagnosis not present

## 2019-11-03 DIAGNOSIS — M545 Low back pain: Secondary | ICD-10-CM | POA: Diagnosis not present

## 2019-11-03 DIAGNOSIS — I252 Old myocardial infarction: Secondary | ICD-10-CM | POA: Diagnosis not present

## 2019-11-03 DIAGNOSIS — R1110 Vomiting, unspecified: Secondary | ICD-10-CM | POA: Diagnosis not present

## 2019-11-03 LAB — CUP PACEART REMOTE DEVICE CHECK
Battery Remaining Longevity: 174 mo
Battery Remaining Percentage: 100 %
Brady Statistic RA Percent Paced: 38 %
Brady Statistic RV Percent Paced: 6 %
Date Time Interrogation Session: 20210616073100
Implantable Lead Implant Date: 20190514
Implantable Lead Implant Date: 20190514
Implantable Lead Location: 753859
Implantable Lead Location: 753860
Implantable Lead Model: 7741
Implantable Lead Model: 7742
Implantable Lead Serial Number: 1015225
Implantable Lead Serial Number: 1020907
Implantable Pulse Generator Implant Date: 20190514
Lead Channel Impedance Value: 1081 Ohm
Lead Channel Impedance Value: 721 Ohm
Lead Channel Pacing Threshold Amplitude: 0.7 V
Lead Channel Pacing Threshold Pulse Width: 0.4 ms
Lead Channel Setting Pacing Amplitude: 2 V
Lead Channel Setting Pacing Amplitude: 3.5 V
Lead Channel Setting Pacing Pulse Width: 1 ms
Lead Channel Setting Sensing Sensitivity: 1.5 mV
Pulse Gen Serial Number: 832937

## 2019-11-04 DIAGNOSIS — Z992 Dependence on renal dialysis: Secondary | ICD-10-CM | POA: Diagnosis not present

## 2019-11-04 DIAGNOSIS — D509 Iron deficiency anemia, unspecified: Secondary | ICD-10-CM | POA: Diagnosis not present

## 2019-11-04 DIAGNOSIS — R112 Nausea with vomiting, unspecified: Secondary | ICD-10-CM | POA: Diagnosis not present

## 2019-11-04 DIAGNOSIS — N186 End stage renal disease: Secondary | ICD-10-CM | POA: Diagnosis not present

## 2019-11-04 NOTE — Progress Notes (Signed)
Remote pacemaker transmission.   

## 2019-11-06 DIAGNOSIS — N186 End stage renal disease: Secondary | ICD-10-CM | POA: Diagnosis not present

## 2019-11-06 DIAGNOSIS — D509 Iron deficiency anemia, unspecified: Secondary | ICD-10-CM | POA: Diagnosis not present

## 2019-11-06 DIAGNOSIS — R112 Nausea with vomiting, unspecified: Secondary | ICD-10-CM | POA: Diagnosis not present

## 2019-11-06 DIAGNOSIS — Z992 Dependence on renal dialysis: Secondary | ICD-10-CM | POA: Diagnosis not present

## 2019-11-08 DIAGNOSIS — M545 Low back pain: Secondary | ICD-10-CM | POA: Diagnosis not present

## 2019-11-09 DIAGNOSIS — D509 Iron deficiency anemia, unspecified: Secondary | ICD-10-CM | POA: Diagnosis not present

## 2019-11-09 DIAGNOSIS — Z992 Dependence on renal dialysis: Secondary | ICD-10-CM | POA: Diagnosis not present

## 2019-11-09 DIAGNOSIS — R112 Nausea with vomiting, unspecified: Secondary | ICD-10-CM | POA: Diagnosis not present

## 2019-11-09 DIAGNOSIS — N186 End stage renal disease: Secondary | ICD-10-CM | POA: Diagnosis not present

## 2019-11-12 ENCOUNTER — Encounter: Payer: Self-pay | Admitting: Internal Medicine

## 2019-11-12 ENCOUNTER — Ambulatory Visit (INDEPENDENT_AMBULATORY_CARE_PROVIDER_SITE_OTHER): Payer: Medicare Other | Admitting: Internal Medicine

## 2019-11-12 ENCOUNTER — Other Ambulatory Visit: Payer: Self-pay | Admitting: Internal Medicine

## 2019-11-12 ENCOUNTER — Other Ambulatory Visit: Payer: Self-pay

## 2019-11-12 VITALS — BP 120/70 | HR 82 | Ht 76.0 in | Wt 233.0 lb

## 2019-11-12 DIAGNOSIS — I2510 Atherosclerotic heart disease of native coronary artery without angina pectoris: Secondary | ICD-10-CM | POA: Diagnosis not present

## 2019-11-12 DIAGNOSIS — R001 Bradycardia, unspecified: Secondary | ICD-10-CM

## 2019-11-12 DIAGNOSIS — I480 Paroxysmal atrial fibrillation: Secondary | ICD-10-CM

## 2019-11-12 DIAGNOSIS — N186 End stage renal disease: Secondary | ICD-10-CM | POA: Diagnosis not present

## 2019-11-12 NOTE — Progress Notes (Signed)
PCP: Manon Hilding, MD Primary Cardiologist: Dr Rudean Haskell Primary EP:  Dr Rayann Heman  [name] is a 69 y.o. male who presents today for routine electrophysiology followup.  Since last being seen in our clinic, the patient reports doing very well.   He is in atrial flutter today but unaware   He has headaches with imdur.  He started dialysis this past year and finds this to be a challenge.  Today, he denies symptoms of palpitations, chest pain, shortness of breath,  dizziness, presyncope, or syncope.  The patient is otherwise without complaint today.   Past Medical History:  Diagnosis Date  . AAA (abdominal aortic aneurysm) ([place]) 06/2016   Mild increase in size of 3.4 cm infrarenal abdominal aortic aneurysm  . Anemia   . Aortic atherosclerosis ([place])   . Asthma   . Atrial flutter ([place])   . AVF (arteriovenous fistula) ([facility])    Left forearm  . CAD (coronary artery disease)    a. s/p prior stenting of RCA in 1999 b. cath in 2003 showing moderate CAD c. NST in 2016 showing small area of ischemic and low-risk  . CHF (congestive heart failure) ([place])   . CKD (chronic kidney disease), stage V ([place])    kidney transplant evaluation  . COPD (chronic obstructive pulmonary disease) ([place])    with ongoing tobacco use and patient failed sham takes  . Diverticulosis 2018   Mild sigmoid colon diverticulosis.  Marland Kitchen DM2 (diabetes mellitus, type 2) ([place])   . Dyslipidemia   . Dysrhythmia 2018   atrial fibrillation  . Edema    chronic lower extremity secondary to right heart failure and chronic venous insufficiency  . Fatigue   . Fatty liver 2010   Mild  . Headache   . HTN (hypertension)   . Morbid obesity ([place])   . Multiple pulmonary nodules 05/2018   Bilateral  . Pneumonia   . Presence of permanent cardiac pacemaker   . 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   Marland Kitchen Reflux esophagitis    hx  . Sleep apnea    PT STATES ONE TEST WAS  POSITIVE AND ANOTHER TEST WAS NEGATIVE  . Two-vessel coronary artery disease    moderate. by cath in 2003. Status post stenting of the mdi RCA November 1999 normal left ventricular ejection fraction  . Wears glasses   . Wears hearing aid    B/L   Past Surgical History:  Procedure Laterality Date  . A/V FISTULAGRAM Left 04/30/2019   Procedure: A/V FISTULAGRAM;  Surgeon: Elam Dutch, MD;  Location: [facility];  Service: Cardiovascular;  Laterality: Left;  . A/V FISTULAGRAM Left 09/22/2019   Procedure: A/V FISTULAGRAM;  Surgeon: Marty Heck, MD;  Location: [facility];  Service: Cardiovascular;  Laterality: Left;  . ABDOMINAL SURGERY    . APPENDECTOMY    . AV FISTULA PLACEMENT Right 03/11/2018   Procedure: CREATION Brachiocephalic Fistula RIGHT ARM;  Surgeon: Rosetta Posner, MD;  Location: [facility];  Service: Vascular;  Laterality: Right;  . AV FISTULA PLACEMENT Left 05/06/2019   Procedure: LEFT BRACHIOCEPHALIC ARTERIOVENOUS (AV) FISTULA CREATION;  Surgeon: Marty Heck, MD;  Location: [facility];  Service: Vascular;  Laterality: Left;  . BACK SURGERY     x3; cages in patient's back since 2000  . BIOPSY  11/12/2018   Procedure: BIOPSY;  Surgeon: Laurence Spates, MD;  Location: [facility];  Service:  Endoscopy;;  . CARDIAC CATHETERIZATION     stent  . CHOLECYSTECTOMY    . CLOSED REDUCTION SHOULDER DISLOCATION    . COLONOSCOPY W/ BIOPSIES AND POLYPECTOMY    . COLONOSCOPY WITH PROPOFOL N/A 11/12/2018   Procedure: COLONOSCOPY WITH PROPOFOL;  Surgeon: Laurence Spates, MD;  Location: [facility];  Service: Endoscopy;  Laterality: N/A;  . CORONARY ANGIOPLASTY    . diabetic ulcers    . DIAGNOSTIC LAPAROSCOPY    . DIALYSIS/PERMA CATHETER REMOVAL N/A 09/22/2019   Procedure: DIALYSIS/PERMA CATHETER REMOVAL;  Surgeon: Marty Heck, MD;  Location: [facility];  Service: Cardiovascular;  Laterality: N/A;  . ESOPHAGOGASTRODUODENOSCOPY (EGD) WITH PROPOFOL N/A  11/12/2018   Procedure: ESOPHAGOGASTRODUODENOSCOPY (EGD) WITH PROPOFOL;  Surgeon: Laurence Spates, MD;  Location: [facility];  Service: Endoscopy;  Laterality: N/A;  . GROIN EXPLORATION    . HEMOSTASIS CLIP PLACEMENT  11/12/2018   Procedure: HEMOSTASIS CLIP PLACEMENT;  Surgeon: Laurence Spates, MD;  Location: [facility];  Service: Endoscopy;;  . INSERT / REPLACE / REMOVE PACEMAKER    . INSERTION OF ARTERIOVENOUS (AV) ARTEGRAFT ARM Left 03/18/2018   Procedure: INSERTION OF ARTERIOVENOUS (AV) FISTULA;  Surgeon: Rosetta Posner, MD;  Location: [facility];  Service: Vascular;  Laterality: Left;  . IR FLUORO GUIDE CV LINE RIGHT  04/11/2019  . IR US GUIDE VASC ACCESS RIGHT  04/11/2019  . LEFT HEART CATH AND CORONARY ANGIOGRAPHY N/A 07/23/2019   Procedure: LEFT HEART CATH AND CORONARY ANGIOGRAPHY;  Surgeon: Martinique, Peter M, MD;  Location: [facility];  Service: Cardiovascular;  Laterality: N/A;  . LIGATION ARTERIOVENOUS GORTEX GRAFT Right 03/18/2018   Procedure: LIGATION ARTERIOVENOUS FISTULA;  Surgeon: Rosetta Posner, MD;  Location: [facility];  Service: Vascular;  Laterality: Right;  . OSTECTOMY Left 04/23/2017   Procedure: OSTECTOMY LEFT GREAT TOE;  Surgeon: [name], DPM;  Location: [facility];  Service: Podiatry;  Laterality: Left;  left great toe  . POLYPECTOMY  11/12/2018   Procedure: POLYPECTOMY;  Surgeon: Laurence Spates, MD;  Location: [facility];  Service: Endoscopy;;  . TUMOR REMOVAL     small intestine x3  . VISCERAL ANGIOGRAPHY N/A 08/02/2019   Procedure: VISCERAL ANGIOGRAPHY;  Surgeon: Waynetta Sandy, MD;  Location: [facility];  Service: Cardiovascular;  Laterality: N/A;    ROS- all systems are reviewed and negative except as per HPI above  Current Outpatient Medications  Medication Sig Dispense Refill  . acetaminophen (TYLENOL) 500 MG tablet Take 1,000 mg by mouth in the morning and at bedtime.     Marland Kitchen albuterol (VENTOLIN HFA) 108 (90 BASE) MCG/ACT inhaler Inhale 2  puffs into the lungs every 6 (six) hours as needed for wheezing or shortness of breath.     Marland Kitchen aspirin EC 81 MG EC tablet Take 1 tablet (81 mg total) by mouth daily.    Marland Kitchen atorvastatin (LIPITOR) 40 MG tablet Take 1 tablet (40 mg total) by mouth daily. 90 tablet 3  . beclomethasone (QVAR) 80 MCG/ACT inhaler Inhale 2 puffs into the lungs 2 (two) times daily as needed (shortness of breath).    . bumetanide (BUMEX) 2 MG tablet Take 2 mg by mouth 2 (two) times daily.    Marland Kitchen buPROPion (WELLBUTRIN SR) 150 MG 12 hr tablet Take 150 mg by mouth 2 (two) times daily.    . carvedilol (COREG) 25 MG tablet TAKE ONE TABLET BY MOUTH TWICE DAILY. (Patient taking differently: Take 25 mg by mouth See admin instructions. Take 25 mg  twice daily on Sun, Mon, Wed, and Fri. Take 25 mg in the evening on Tues, Thurs, and Sat (dialysis days)) 180 tablet 2  . Cholecalciferol (VITAMIN D3) 2000 units TABS Take 2,000 Units by mouth daily.     Marland Kitchen diltiazem (CARDIZEM) 30 MG tablet TAKE ONE TABLET BY MOUTH TWICE DAILY. (Patient taking differently: Take 30 mg by mouth 2 (two) times daily. Take 30 mg twice daily on Sun, Mon, Wed, and Fri. Take 25 mg in the evening on Tues, Thurs, and Sat (dialysis days)) 60 tablet 6  . isosorbide mononitrate (IMDUR) 30 MG 24 hr tablet Take 30 mg by mouth daily.     Marland Kitchen linaclotide (LINZESS) 290 MCG CAPS capsule Take 290 mcg by mouth daily as needed (constipation).    . Melatonin 10 MG TABS Take 10 mg by mouth at bedtime as needed (sleep).     . Multiple Vitamin (MULTIVITAMIN WITH MINERALS) TABS tablet Take 1 tablet by mouth every morning.     . nitroGLYCERIN (NITROSTAT) 0.4 MG SL tablet Place 1 tablet (0.4 mg total) under the tongue every 5 (five) minutes as needed for chest pain. 25 tablet 3  . omeprazole (PRILOSEC) 20 MG capsule Take 20 mg by mouth daily.     . polyethylene glycol (MIRALAX / GLYCOLAX) packet Take 17 g by mouth daily as needed for mild constipation. Mix in morning coffee    . Sennosides 25 MG  TABS Take 50 mg by mouth daily as needed (constipation).     . sevelamer carbonate (RENVELA) 800 MG tablet Take 800-1,600 mg by mouth See admin instructions. Take 1600 mg with each meal and 800 mg with each snack    . silver sulfADIAZINE (SILVADENE) 1 % cream Apply 1 application topically daily as needed (For wound care with dressing).      No current facility-administered medications for this visit.    Physical Exam: Vitals:   11/12/19 1335  Pulse: 82  SpO2: 96%  Weight: 233 lb (105.7 kg)  Height: 6\' 4"  (1.93 m)    GEN- The patient is chronically ill appearing, alert and oriented x 3 today.   Head- normocephalic, atraumatic Eyes-  Sclera clear, conjunctiva pink Ears- hearing intact Oropharynx- clear Lungs-  normal work of breathing Chest- pacemaker pocket is well healed Heart- irregular rate and rhythm  GI- soft  Extremities- no clubbing, cyanosis, + edema  Pacemaker interrogation- reviewed in detail today,  See PACEART report   Assessment and Plan:  1. Symptomatic sinus bradycardia  Normal pacemaker function See Pace Art report RV threshold today is normal No changes today he is not device dependant today  2. Paroxysmal atrial fibrillation/ atrial flutter Doing well Low burden by remotes chads2vasc score is 3.  He has anemia, prior GI bleed and ESRD and therefore is a poor candidate for [facility] therapy long term.  Today, we discussed Watchman.  He is very interested in this procedure to reduce stroke risks.  He is planning to get a PD catheter for HD in September and would want to wait until later in the year to proceed.  3. CAD No ischemic symptoms No changes  4. ESRD He is hoping to have PD arranged over the next few months. No change required today  5. HL Stable No change required today  Risks, benefits and potential toxicities for medications prescribed and/or refilled reviewed with patient today.   Return in a year  Thompson Grayer MD, [facility] 11/12/2019 1:46  PM

## 2019-11-12 NOTE — Patient Instructions (Addendum)
Your physician wants you to follow-up in: [place] will receive a reminder letter in the mail two months in advance. If you don't receive a letter, please call our office to schedule the follow-up appointment.  Your physician has recommended you make the following change in your medication:   STOP IMDUR  Thank you for choosing [facility]!!

## 2019-11-13 DIAGNOSIS — D509 Iron deficiency anemia, unspecified: Secondary | ICD-10-CM | POA: Diagnosis not present

## 2019-11-13 DIAGNOSIS — R112 Nausea with vomiting, unspecified: Secondary | ICD-10-CM | POA: Diagnosis not present

## 2019-11-13 DIAGNOSIS — N186 End stage renal disease: Secondary | ICD-10-CM | POA: Diagnosis not present

## 2019-11-13 DIAGNOSIS — Z992 Dependence on renal dialysis: Secondary | ICD-10-CM | POA: Diagnosis not present

## 2019-11-15 DIAGNOSIS — S32020A Wedge compression fracture of second lumbar vertebra, initial encounter for closed fracture: Secondary | ICD-10-CM | POA: Diagnosis not present

## 2019-11-16 DIAGNOSIS — N186 End stage renal disease: Secondary | ICD-10-CM | POA: Diagnosis not present

## 2019-11-16 DIAGNOSIS — D509 Iron deficiency anemia, unspecified: Secondary | ICD-10-CM | POA: Diagnosis not present

## 2019-11-16 DIAGNOSIS — R112 Nausea with vomiting, unspecified: Secondary | ICD-10-CM | POA: Diagnosis not present

## 2019-11-16 DIAGNOSIS — Z992 Dependence on renal dialysis: Secondary | ICD-10-CM | POA: Diagnosis not present

## 2019-11-17 ENCOUNTER — Emergency Department (HOSPITAL_COMMUNITY): Payer: Medicare Other

## 2019-11-17 ENCOUNTER — Emergency Department (HOSPITAL_COMMUNITY)
Admission: EM | Admit: 2019-11-17 | Discharge: 2019-11-17 | Disposition: A | Discharge status: Home / Self Care | Payer: Medicare Other | Attending: Emergency Medicine | Admitting: Emergency Medicine

## 2019-11-17 ENCOUNTER — Other Ambulatory Visit: Payer: Self-pay

## 2019-11-17 DIAGNOSIS — Z79899 Other long term (current) drug therapy: Secondary | ICD-10-CM | POA: Diagnosis not present

## 2019-11-17 DIAGNOSIS — W19XXXA Unspecified fall, initial encounter: Secondary | ICD-10-CM | POA: Diagnosis not present

## 2019-11-17 DIAGNOSIS — I700 Atherosclerosis of aorta: Secondary | ICD-10-CM | POA: Diagnosis not present

## 2019-11-17 DIAGNOSIS — R0781 Pleurodynia: Secondary | ICD-10-CM | POA: Insufficient documentation

## 2019-11-17 DIAGNOSIS — Y929 Unspecified place or not applicable: Secondary | ICD-10-CM | POA: Insufficient documentation

## 2019-11-17 DIAGNOSIS — M545 Low back pain, unspecified: Secondary | ICD-10-CM

## 2019-11-17 DIAGNOSIS — E1122 Type 2 diabetes mellitus with diabetic chronic kidney disease: Secondary | ICD-10-CM | POA: Insufficient documentation

## 2019-11-17 DIAGNOSIS — I132 Hypertensive heart and chronic kidney disease with heart failure and with stage 5 chronic kidney disease, or end stage renal disease: Secondary | ICD-10-CM | POA: Insufficient documentation

## 2019-11-17 DIAGNOSIS — Z992 Dependence on renal dialysis: Secondary | ICD-10-CM | POA: Diagnosis not present

## 2019-11-17 DIAGNOSIS — Z7982 Long term (current) use of aspirin: Secondary | ICD-10-CM | POA: Diagnosis not present

## 2019-11-17 DIAGNOSIS — R441 Visual hallucinations: Secondary | ICD-10-CM | POA: Insufficient documentation

## 2019-11-17 DIAGNOSIS — Y999 Unspecified external cause status: Secondary | ICD-10-CM | POA: Insufficient documentation

## 2019-11-17 DIAGNOSIS — R531 Weakness: Secondary | ICD-10-CM | POA: Diagnosis not present

## 2019-11-17 DIAGNOSIS — S59902A Unspecified injury of left elbow, initial encounter: Secondary | ICD-10-CM | POA: Diagnosis present

## 2019-11-17 DIAGNOSIS — S51802A Unspecified open wound of left forearm, initial encounter: Secondary | ICD-10-CM | POA: Diagnosis not present

## 2019-11-17 DIAGNOSIS — F17210 Nicotine dependence, cigarettes, uncomplicated: Secondary | ICD-10-CM | POA: Diagnosis not present

## 2019-11-17 DIAGNOSIS — S51002A Unspecified open wound of left elbow, initial encounter: Secondary | ICD-10-CM | POA: Insufficient documentation

## 2019-11-17 DIAGNOSIS — M79602 Pain in left arm: Secondary | ICD-10-CM | POA: Diagnosis not present

## 2019-11-17 DIAGNOSIS — N186 End stage renal disease: Secondary | ICD-10-CM | POA: Insufficient documentation

## 2019-11-17 DIAGNOSIS — J45909 Unspecified asthma, uncomplicated: Secondary | ICD-10-CM | POA: Insufficient documentation

## 2019-11-17 DIAGNOSIS — Y939 Activity, unspecified: Secondary | ICD-10-CM | POA: Insufficient documentation

## 2019-11-17 DIAGNOSIS — I2510 Atherosclerotic heart disease of native coronary artery without angina pectoris: Secondary | ICD-10-CM | POA: Insufficient documentation

## 2019-11-17 DIAGNOSIS — I714 Abdominal aortic aneurysm, without rupture: Secondary | ICD-10-CM | POA: Diagnosis not present

## 2019-11-17 DIAGNOSIS — Z950 Presence of cardiac pacemaker: Secondary | ICD-10-CM | POA: Insufficient documentation

## 2019-11-17 DIAGNOSIS — R0789 Other chest pain: Secondary | ICD-10-CM | POA: Insufficient documentation

## 2019-11-17 DIAGNOSIS — I5032 Chronic diastolic (congestive) heart failure: Secondary | ICD-10-CM | POA: Diagnosis not present

## 2019-11-17 DIAGNOSIS — S32020A Wedge compression fracture of second lumbar vertebra, initial encounter for closed fracture: Secondary | ICD-10-CM | POA: Diagnosis not present

## 2019-11-17 DIAGNOSIS — G9589 Other specified diseases of spinal cord: Secondary | ICD-10-CM | POA: Diagnosis not present

## 2019-11-17 LAB — CBC WITH DIFFERENTIAL/PLATELET
Abs Immature Granulocytes: 0.07 10*3/uL (ref 0.00–0.07)
Basophils Absolute: 0 10*3/uL (ref 0.0–0.1)
Basophils Relative: 0 %
Eosinophils Absolute: 0.1 10*3/uL (ref 0.0–0.5)
Eosinophils Relative: 1 %
HCT: 45.1 % (ref 39.0–52.0)
Hemoglobin: 14.8 g/dL (ref 13.0–17.0)
Immature Granulocytes: 1 %
Lymphocytes Relative: 15 %
Lymphs Abs: 1.4 10*3/uL (ref 0.7–4.0)
MCH: 33.2 pg (ref 26.0–34.0)
MCHC: 32.8 g/dL (ref 30.0–36.0)
MCV: 101.1 fL — ABNORMAL HIGH (ref 80.0–100.0)
Monocytes Absolute: 0.9 10*3/uL (ref 0.1–1.0)
Monocytes Relative: 10 %
Neutro Abs: 6.4 10*3/uL (ref 1.7–7.7)
Neutrophils Relative %: 73 %
Platelets: 199 10*3/uL (ref 150–400)
RBC: 4.46 MIL/uL (ref 4.22–5.81)
RDW: 15.7 % — ABNORMAL HIGH (ref 11.5–15.5)
WBC: 8.9 10*3/uL (ref 4.0–10.5)
nRBC: 0 % (ref 0.0–0.2)

## 2019-11-17 LAB — COMPREHENSIVE METABOLIC PANEL
ALT: 9 U/L (ref 0–44)
AST: 25 U/L (ref 15–41)
Albumin: 3.2 g/dL — ABNORMAL LOW (ref 3.5–5.0)
Alkaline Phosphatase: 68 U/L (ref 38–126)
Anion gap: 16 — ABNORMAL HIGH (ref 5–15)
BUN: 45 mg/dL — ABNORMAL HIGH (ref 8–23)
CO2: 26 mmol/L (ref 22–32)
Calcium: 9.1 mg/dL (ref 8.9–10.3)
Chloride: 92 mmol/L — ABNORMAL LOW (ref 98–111)
Creatinine, Ser: 7.01 mg/dL — ABNORMAL HIGH (ref 0.61–1.24)
GFR calc Af Amer: 9 mL/min — ABNORMAL LOW (ref >60)
GFR calc non Af Amer: 7 mL/min — ABNORMAL LOW (ref >60)
Glucose, Bld: 91 mg/dL (ref 70–99)
Potassium: 4.5 mmol/L (ref 3.5–5.1)
Sodium: 134 mmol/L — ABNORMAL LOW (ref 135–145)
Total Bilirubin: 0.9 mg/dL (ref 0.3–1.2)
Total Protein: 7 g/dL (ref 6.5–8.1)

## 2019-11-17 MED ORDER — IOHEXOL 300 MG/ML  SOLN
100.0000 mL | Freq: Once | INTRAMUSCULAR | Status: AC | PRN
  Administered 2019-11-17: 100 mL via INTRAVENOUS

## 2019-11-17 MED ORDER — ACETAMINOPHEN 500 MG PO TABS
1000.0000 mg | ORAL_TABLET | Freq: Once | ORAL | Status: AC
  Administered 2019-11-17: 1000 mg via ORAL
  Filled 2019-11-17: qty 2

## 2019-11-17 MED ORDER — KETOROLAC TROMETHAMINE 30 MG/ML IJ SOLN
15.0000 mg | Freq: Once | INTRAMUSCULAR | Status: AC
  Administered 2019-11-17: 15 mg via INTRAVENOUS
  Filled 2019-11-17: qty 1

## 2019-11-17 MED ORDER — MORPHINE SULFATE 15 MG PO TABS: 7.5000 mg | ORAL_TABLET | ORAL | 0 refills | Status: DC | PRN

## 2019-11-17 MED ORDER — HYDROMORPHONE HCL 1 MG/ML IJ SOLN
0.5000 mg | Freq: Once | INTRAMUSCULAR | Status: AC
  Administered 2019-11-17: 0.5 mg via INTRAVENOUS
  Filled 2019-11-17: qty 1

## 2019-11-17 NOTE — Discharge Instructions (Addendum)
Take 4 over the counter ibuprofen tablets 3 times a day or 2 over-the-counter naproxen tablets twice a day for pain. Also take tylenol 1000mg (2 extra strength) four times a day.   Stop taking your Percocet if you are going to do this regimen.  Then take the pain medicine if you feel like you need it. Narcotics do not help with the pain, they only make you care about it less.  You can become addicted to this, people may break into your house to steal it.  It will constipate you.  If you drive under the influence of this medicine you can get a DUI.

## 2019-11-17 NOTE — ED Notes (Signed)
Wife stated, he has been seeing things that are not there. This  Started on Thursday, He takes Oxycodone that started June 21.

## 2019-11-17 NOTE — ED Notes (Signed)
Wound to L elbow and L wrist irrigated and dressing with non-adherent pad applied.

## 2019-11-17 NOTE — ED Triage Notes (Signed)
Pt. Stated, I fell yesterday and Im having left rib pain, arm pan.

## 2019-11-17 NOTE — ED Provider Notes (Signed)
[facility] EMERGENCY DEPARTMENT Provider Note   CSN: [id number] Arrival date & time: 11/17/19  1046     History Chief Complaint  Patient presents with  . Fall  . Arm Pain  . Chest Pain    [name] is a 69 y.o. male.  69 yo M with chief complaints of a fall.  Patient states that he had a kyphoplasty that was done last week at an outside hospital.  Michela Pitcher once they started working on his back he had severe pain.  States that this is been unrelenting and not improved over the past week.  Was started on 7.5 325 Percocets.  States he has been having some hallucinations but no significant improvement of his back pain.  Said the pain was so severe that he ended up falling yesterday.  Landed on the left side of his chest wall and onto his left arm.  He ended up having a wound to his arm but no significant pain with range of motion.  Complaining more of pain along the left anterior chest wall.  Wife states that he has been seeing children that are not there and bugs in different places.  He tried to burn a mole off of his leg thinking it was a tick yesterday.  He states that the back pain is to the low back and goes bilaterally.  Denies radiation down the legs.  Worse with movement twisting palpation.  He is better sitting.  He denies loss of bowel or bladder denies loss of peritoneal sensation denies numbness to the legs.  He has been having generalized weakness since starting dialysis not that long ago.   Fall Associated symptoms include chest pain. Pertinent negatives include no abdominal pain, no headaches and no shortness of breath.  Arm Pain Associated symptoms include chest pain. Pertinent negatives include no abdominal pain, no headaches and no shortness of breath.  Chest Pain Associated symptoms: back pain   Associated symptoms: no abdominal pain, no fever, no headache, no palpitations, no shortness of breath and no vomiting        Past Medical History:    Diagnosis Date  . AAA (abdominal aortic aneurysm) ([place]) 06/2016   Mild increase in size of 3.4 cm infrarenal abdominal aortic aneurysm  . Anemia   . Aortic atherosclerosis ([place])   . Asthma   . Atrial flutter ([place])   . AVF (arteriovenous fistula) (HCC)    Left forearm  . CAD (coronary artery disease)    a. s/p prior stenting of RCA in 1999 b. cath in 2003 showing moderate CAD c. NST in 2016 showing small area of ischemic and low-risk  . CHF (congestive heart failure) ([place])   . CKD (chronic kidney disease), stage V ([place])    kidney transplant evaluation  . COPD (chronic obstructive pulmonary disease) ([place])    with ongoing tobacco use and patient failed sham takes  . Diverticulosis 2018   Mild sigmoid colon diverticulosis.  Marland Kitchen DM2 (diabetes mellitus, type 2) ([place])   . Dyslipidemia   . Dysrhythmia 2018   atrial fibrillation  . Edema    chronic lower extremity secondary to right heart failure and chronic venous insufficiency  . Fatigue   . Fatty liver 2010   Mild  . Headache   . HTN (hypertension)   . Morbid obesity ([place])   . Multiple pulmonary nodules 05/2018   Bilateral  . Pneumonia   . Presence of permanent cardiac pacemaker   . 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   Marland Kitchen Reflux esophagitis    hx  . Sleep apnea    PT STATES ONE TEST WAS POSITIVE AND ANOTHER TEST WAS NEGATIVE  . Two-vessel coronary artery disease    moderate. by cath in 2003. Status post stenting of the mdi RCA November 1999 normal left ventricular ejection fraction  . Wears glasses   . Wears hearing aid    B/L    Patient Active Problem List   Diagnosis Date Noted  . NSTEMI (non-ST elevated myocardial infarction) ([place]) 07/22/2019  . Renal mass 07/14/2019  . Renal anasarca   . ESRD (end stage renal disease) ([place]) 04/10/2019  . 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[place]) 03/18/2018  . Chronic renal insufficiency, stage 3 (moderate) 03/18/2018  .  Diffuse wheezing 12/02/2017  . OSA and COPD overlap syndrome ([place]) 12/02/2017  . Sleeps in sitting position due to orthopnea 12/02/2017  . Pacemaker 12/02/2017  . Bradycardia with 31-40 beats per minute 12/02/2017  . Coronary artery disease due to calcified coronary lesion 12/02/2017  . Insomnia due to medical condition 12/02/2017  . Snoring 12/02/2017  . Chronic diastolic CHF (congestive heart failure) ([place]) 05/10/2017  . Atypical chest pain   . Chest pain 05/09/2017  . Atrial flutter ([place]) 05/09/2017  . Acute renal failure superimposed on stage 4 chronic kidney disease ([place]) 05/09/2017  . Hypokalemia 04/05/2016  . Diabetes mellitus with nephropathy ([place]) 04/05/2016  . Hyponatremia 04/05/2016  . Hematuria 04/05/2016  . Diarrhea 04/05/2016  . Bilateral diabetic foot ulcer associated with secondary diabetes mellitus ([place]) 04/05/2016  . Streptococcal bacteremia 04/05/2016  . Cellulitis of leg, left 04/05/2016  . Sepsis ([place]) 04/04/2016  . CAD (coronary artery disease) 11/10/2015  . Bilateral lower extremity edema 07/25/2015  . Foot infection 09/27/2014  . Diabetic foot infection ([place]) 09/27/2014  . CKD stage 3 due to type 2 diabetes mellitus ([place])   . Diabetes mellitus with renal manifestations, uncontrolled ([place])   . Benign essential HTN   . ED (erectile dysfunction) 06/09/2012  . Hypertension 03/11/2011  . OSTEOMYELITIS, ACUTE, ANKLE/FOOT 07/24/2010  . EDEMA 02/15/2010  . MORBID OBESITY 07/05/2009  . TOBACCO ABUSE 07/05/2009  . OBSTRUCTIVE SLEEP APNEA 07/05/2009  . COPD (chronic obstructive pulmonary disease) ([place]) 07/05/2009  . 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 HYPERLIPIDEMIA 03/30/2008  . Generalized anxiety disorder 03/30/2008  . [place] DISEASE 03/30/2008    Past Surgical History:  Procedure Laterality Date  . A/V FISTULAGRAM Left 04/30/2019   Procedure: A/V FISTULAGRAM;  Surgeon: Elam Dutch, MD;  Location: [facility];  Service: Cardiovascular;  Laterality: Left;    . A/V FISTULAGRAM Left 09/22/2019   Procedure: A/V FISTULAGRAM;  Surgeon: Marty Heck, MD;  Location: [facility];  Service: Cardiovascular;  Laterality: Left;  . ABDOMINAL SURGERY    . APPENDECTOMY    . AV FISTULA PLACEMENT Right 03/11/2018   Procedure: CREATION Brachiocephalic Fistula RIGHT ARM;  Surgeon: Rosetta Posner, MD;  Location: [facility];  Service: Vascular;  Laterality: Right;  . AV FISTULA PLACEMENT Left 05/06/2019   Procedure: LEFT BRACHIOCEPHALIC ARTERIOVENOUS (AV) FISTULA CREATION;  Surgeon: Marty Heck, MD;  Location: [facility];  Service: Vascular;  Laterality: Left;  . BACK SURGERY     x3; cages in patient's back since 2000  . BIOPSY  11/12/2018   Procedure: BIOPSY;  Surgeon: Laurence Spates, MD;  Location: [facility];  Service: Endoscopy;;  . CARDIAC CATHETERIZATION  stent  . CHOLECYSTECTOMY    . CLOSED REDUCTION SHOULDER DISLOCATION    . COLONOSCOPY W/ BIOPSIES AND POLYPECTOMY    . COLONOSCOPY WITH PROPOFOL N/A 11/12/2018   Procedure: COLONOSCOPY WITH PROPOFOL;  Surgeon: Laurence Spates, MD;  Location: [facility];  Service: Endoscopy;  Laterality: N/A;  . CORONARY ANGIOPLASTY    . diabetic ulcers    . DIAGNOSTIC LAPAROSCOPY    . DIALYSIS/PERMA CATHETER REMOVAL N/A 09/22/2019   Procedure: DIALYSIS/PERMA CATHETER REMOVAL;  Surgeon: Marty Heck, MD;  Location: [facility];  Service: Cardiovascular;  Laterality: N/A;  . ESOPHAGOGASTRODUODENOSCOPY (EGD) WITH PROPOFOL N/A 11/12/2018   Procedure: ESOPHAGOGASTRODUODENOSCOPY (EGD) WITH PROPOFOL;  Surgeon: Laurence Spates, MD;  Location: [facility];  Service: Endoscopy;  Laterality: N/A;  . GROIN EXPLORATION    . HEMOSTASIS CLIP PLACEMENT  11/12/2018   Procedure: HEMOSTASIS CLIP PLACEMENT;  Surgeon: Laurence Spates, MD;  Location: [facility];  Service: Endoscopy;;  . INSERT / REPLACE / REMOVE PACEMAKER    . INSERTION OF ARTERIOVENOUS (AV) ARTEGRAFT ARM Left 03/18/2018   Procedure: INSERTION OF  ARTERIOVENOUS (AV) FISTULA;  Surgeon: Rosetta Posner, MD;  Location: [facility];  Service: Vascular;  Laterality: Left;  . IR FLUORO GUIDE CV LINE RIGHT  04/11/2019  . IR US GUIDE VASC ACCESS RIGHT  04/11/2019  . LEFT HEART CATH AND CORONARY ANGIOGRAPHY N/A 07/23/2019   Procedure: LEFT HEART CATH AND CORONARY ANGIOGRAPHY;  Surgeon: Martinique, Peter M, MD;  Location: [facility];  Service: Cardiovascular;  Laterality: N/A;  . LIGATION ARTERIOVENOUS GORTEX GRAFT Right 03/18/2018   Procedure: LIGATION ARTERIOVENOUS FISTULA;  Surgeon: Rosetta Posner, MD;  Location: [facility];  Service: Vascular;  Laterality: Right;  . OSTECTOMY Left 04/23/2017   Procedure: OSTECTOMY LEFT GREAT TOE;  Surgeon: [name], DPM;  Location: [facility];  Service: Podiatry;  Laterality: Left;  left great toe  . POLYPECTOMY  11/12/2018   Procedure: POLYPECTOMY;  Surgeon: Laurence Spates, MD;  Location: [facility];  Service: Endoscopy;;  . TUMOR REMOVAL     small intestine x3  . VISCERAL ANGIOGRAPHY N/A 08/02/2019   Procedure: VISCERAL ANGIOGRAPHY;  Surgeon: Waynetta Sandy, MD;  Location: [facility];  Service: Cardiovascular;  Laterality: N/A;       Family History  Problem Relation Age of Onset  . Diabetes Mother   . Alcohol abuse Father     Social History   Tobacco Use  . Smoking status: Current Every Day Smoker    Packs/day: 1.00    Years: 40.00    Pack years: 40.00    Types: Cigarettes    Start date: 05/20/1968  . Smokeless tobacco: Never Used  Vaping Use  . Vaping Use: Never used  Substance Use Topics  . Alcohol use: Not Currently    Alcohol/week: 0.0 standard drinks    Comment: rare  . Drug use: No    Home Medications Prior to Admission medications   Medication Sig Start Date End Date Taking? Authorizing Provider  acetaminophen (TYLENOL) 500 MG tablet Take 1,000 mg by mouth in the morning and at bedtime.     [provider]  albuterol (VENTOLIN HFA) 108 (90 BASE) MCG/ACT  inhaler Inhale 2 puffs into the lungs every 6 (six) hours as needed for wheezing or shortness of breath.     [provider]  aspirin EC 81 MG EC tablet Take 1 tablet (81 mg total) by mouth daily. 07/25/19   Isaac Bliss, Rayford Halsted, MD  atorvastatin (LIPITOR) 40 MG tablet Take 1 tablet (40 mg total) by mouth daily. 08/23/19   Arnoldo Lenis, MD  beclomethasone (QVAR) 80 MCG/ACT inhaler Inhale 2 puffs into the lungs 2 (two) times daily as needed (shortness of breath).    [provider]  bumetanide (BUMEX) 2 MG tablet Take 2 mg by mouth 2 (two) times daily. 07/15/19   [provider]  buPROPion (WELLBUTRIN SR) 150 MG 12 hr tablet Take 150 mg by mouth 2 (two) times daily.    [provider]  carvedilol (COREG) 25 MG tablet TAKE ONE TABLET BY MOUTH TWICE DAILY. Patient taking differently: Take 25 mg by mouth See admin instructions. Take 25 mg twice daily on Sun, Mon, Wed, and Fri. Take 25 mg in the evening on Tues, Thurs, and Sat (dialysis days) 02/15/19   Arnoldo Lenis, MD  Cholecalciferol (VITAMIN D3) 2000 units TABS Take 2,000 Units by mouth daily.     [provider]  diltiazem (CARDIZEM) 30 MG tablet TAKE ONE TABLET BY MOUTH TWICE DAILY. Patient taking differently: Take 30 mg by mouth 2 (two) times daily. Take 30 mg twice daily on Sun, Mon, Wed, and Fri. Take 25 mg in the evening on Tues, Thurs, and Sat (dialysis days) 08/09/19   Arnoldo Lenis, MD  ferric citrate (AURYXIA) 1 GM 210 MG(Fe) tablet Take 420 mg by mouth 2 (two) times daily with a meal.    [provider]  linaclotide (LINZESS) 290 MCG CAPS capsule Take 290 mcg by mouth daily as needed (constipation).    [provider]  Melatonin 10 MG TABS Take 10 mg by mouth at bedtime as needed (sleep).     [provider]  morphine (MSIR) 15 MG tablet Take 0.5 tablets (7.5 mg total) by mouth every 4 (four) hours as needed for severe pain. 11/17/19   Deno Etienne, DO    Multiple Vitamin (MULTIVITAMIN WITH MINERALS) TABS tablet Take 1 tablet by mouth every morning.     [provider]  nitroGLYCERIN (NITROSTAT) 0.4 MG SL tablet Place 1 tablet (0.4 mg total) under the tongue every 5 (five) minutes as needed for chest pain. 08/04/19 11/12/19  Arnoldo Lenis, MD  omeprazole (PRILOSEC) 20 MG capsule Take 20 mg by mouth daily.     [provider]  polyethylene glycol (MIRALAX / GLYCOLAX) packet Take 17 g by mouth daily as needed for mild constipation. Mix in morning coffee    [provider]  Sennosides 25 MG TABS Take 50 mg by mouth daily as needed (constipation).     [provider]  sevelamer carbonate (RENVELA) 800 MG tablet Take 800-1,600 mg by mouth See admin instructions. Take 1600 mg with each meal and 800 mg with each snack 07/26/19   [provider]  silver sulfADIAZINE (SILVADENE) 1 % cream Apply 1 application topically daily as needed (For wound care with dressing).     [provider]    Allergies    Patient has no known allergies.  Review of Systems   Review of Systems  Constitutional: Negative for chills and fever.  HENT: Negative for congestion and facial swelling.   Eyes: Negative for discharge and visual disturbance.  Respiratory: Negative for shortness of breath.   Cardiovascular: Positive for chest pain. Negative for palpitations.  Gastrointestinal: Negative for abdominal pain, diarrhea and vomiting.  Musculoskeletal: Positive for back pain. Negative for arthralgias and myalgias.  Skin: Positive for wound. Negative for color change and rash.  Neurological: Negative for tremors, syncope and headaches.  Psychiatric/Behavioral: Negative for confusion and dysphoric mood.    Physical Exam Updated Vital Signs BP (!) 177/87   Pulse 66   Temp 98.4 F (36.9 C) (Oral)   Resp (!) 24   Ht 6\' 4"  (1.93 m)   Wt 104.3 kg   SpO2 97%   BMI 28.00 kg/m   Physical Exam Vitals and nursing note  reviewed.  Constitutional:      Appearance: He is well-developed.  HENT:     Head: Normocephalic and atraumatic.  Eyes:     Pupils: Pupils are equal, round, and reactive to light.  Neck:     Vascular: No JVD.  Cardiovascular:     Rate and Rhythm: Normal rate and regular rhythm.     Heart sounds: No murmur heard.  No friction rub. No gallop.   Pulmonary:     Effort: No respiratory distress.     Breath sounds: No wheezing.  Abdominal:     General: There is no distension.     Tenderness: There is no guarding or rebound.  Musculoskeletal:        General: Normal range of motion.     Cervical back: Normal range of motion and neck supple.     Comments: Skin tears to the left elbow and the left forearm.  Located on the volar aspect.  Both wounds appear to be dirty.  There is some discharge to the distal wound.  Full range of motion of the wrist and elbow without pain.  Pain along the left anterior chest along the lateral clavicular line.  Is worst about ribs 5 through 7.  Skin:    Coloration: Skin is not pale.     Findings: No rash.  Neurological:     Mental Status: He is alert and oriented to person, place, and time.  Psychiatric:        Behavior: Behavior normal.     ED Results / Procedures / Treatments   Labs (all labs ordered are listed, but only abnormal results are displayed) Labs Reviewed  CBC WITH DIFFERENTIAL/PLATELET - Abnormal; Notable for the following components:      Result Value   MCV 101.1 (*)    RDW 15.7 (*)    All other components within normal limits  COMPREHENSIVE METABOLIC PANEL - Abnormal; Notable for the following components:   Sodium 134 (*)    Chloride 92 (*)    BUN 45 (*)    Creatinine, Ser 7.01 (*)    Albumin 3.2 (*)    GFR calc non Af Amer 7 (*)    GFR calc Af Amer 9 (*)    Anion gap 16 (*)    All other components within normal limits    EKG None  Radiology DG Chest 2 View  Result Date: 11/17/2019 CLINICAL DATA:  Recent fall with  shortness of breath EXAM: CHEST - 2 VIEW COMPARISON:  07/22/2019 FINDINGS: Cardiac shadow is stable. Pacing device is again seen and stable. No focal infiltrate or sizable effusion is seen. Degenerative changes of the thoracic spine are noted. Old healed rib fractures are noted on the right. Known left upper lobe nodule on previous CT is not well appreciated on this exam. IMPRESSION: No acute abnormality noted. Electronically Signed   By: Inez Catalina M.D.   On: 11/17/2019 11:31   CT Head Wo Contrast  Result Date: 11/17/2019 CLINICAL DATA:  Psychosis EXAM: CT HEAD WITHOUT CONTRAST TECHNIQUE: Contiguous axial images were  obtained from the base of the skull through the vertex without intravenous contrast. COMPARISON:  None. FINDINGS: Brain: Ventricle size normal. Extensive white matter disease bilaterally. This is most consistent with chronic microvascular ischemia. No prior studies for confirmation. Chronic infarct right basal ganglia. Negative for acute infarct, hemorrhage, mass Vascular: Negative for hyperdense vessel Skull: Negative for skull lesion. Sinuses/Orbits: Mucosal edema paranasal sinuses.  Negative orbit. Other: None IMPRESSION: Extensive chronic microvascular ischemic changes in the white matter. No acute abnormality. Electronically Signed   By: Franchot Gallo M.D.   On: 11/17/2019 20:00   CT LUMBAR SPINE W CONTRAST  Result Date: 11/17/2019 CLINICAL DATA:  Low back pain, kyphoplasty 1 week ago EXAM: CT LUMBAR SPINE WITH CONTRAST TECHNIQUE: Multidetector CT imaging of the lumbar spine was performed with intravenous contrast administration. CONTRAST:  163mL OMNIPAQUE IOHEXOL 300 MG/ML  SOLN COMPARISON:  11/03/2019 FINDINGS: Segmentation: 5 lumbar type vertebrae. Alignment: Alignment is anatomic. Vertebrae: L2 compression deformity is again identified with interval changes of vertebral augmentation. There is minimal extravasation of methylmethacrylate into the L1/L2 disc space and along the  posterior margin of the L2 vertebral body. No other acute bony abnormalities. Paraspinal and other soft tissues: Very mild paraspinal soft tissue swelling at the L2 level consistent with the subacute compression deformity. No evidence of paraspinal fluid collection or abscess on this exam. 3.6 cm infrarenal abdominal aortic aneurysm is unchanged since prior abdominal CT. There is extensive atherosclerosis of the aorta and its branches. Visualized retroperitoneal soft tissues are unremarkable. 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 Disc levels: Stable postsurgical changes from L4/L5 and L5/S1 discectomy and laminotomy. Mild retropulsion from the L2 compression fracture results in flattening of the ventral margin of the thecal sac at L2. IMPRESSION: 1. Subacute L2 compression deformity with interval vertebral augmentation. No evidence of complication. 2. Chronic postsurgical changes at L4-5 and L5-S1. 3. Stable 3.6 cm infrarenal abdominal aortic aneurysm. 4.  Aortic Atherosclerosis (ICD10-I70.0). Electronically Signed   By: Randa Ngo M.D.   On: 11/17/2019 20:38    Procedures Procedures (including critical care time)  Medications Ordered in ED Medications  HYDROmorphone (DILAUDID) injection 0.5 mg (0.5 mg Intravenous Given 11/17/19 1824)  acetaminophen (TYLENOL) tablet 1,000 mg (1,000 mg Oral Given 11/17/19 1822)  ketorolac (TORADOL) 30 MG/ML injection 15 mg (15 mg Intravenous Given 11/17/19 1823)  iohexol (OMNIPAQUE) 300 MG/ML solution 100 mL (100 mLs Intravenous Contrast Given 11/17/19 1956)    ED Course  I have reviewed the triage vital signs and the nursing notes.  Pertinent labs & imaging results that were available during my care of the patient were reviewed by me and considered in my medical decision making (see chart for details).    MDM Rules/Calculators/A&P                          69 yo M with a chief complaints of low back pain  after having a kyphoplasty.  Patient states that his pain has been unbearable and he needs something to relieve it.  Has been already multiple times to see his spinal surgeon and also was seen yesterday after he had a fall.  He had rib films at that time that he said were normal.  We will attempt to treat the patient's pain here.   Unfortunately the patient has an implanted pacemaker and I was notified that I am unable to get an MRI of him until  tomorrow.  Will obtain a CT scan and then discuss with neurosurgery here.  Discussed with neurosurgery based on my history and physical exam and the CT imaging did not feel he needed emergent MRI.  They recommended that he follow-up with his neurosurgeon in the office.  Reassessed and he is feeling much better after a dose of Dilaudid here.  As the Percocets appear to be making him hallucinate will attempt a different pain management protocol.  9:34 PM:  I have discussed the diagnosis/risks/treatment options with the patient and family and believe the pt to be eligible for discharge home to follow-up with neurosurgery. We also discussed returning to the ED immediately if new or worsening sx occur. We discussed the sx which are most concerning (e.g., sudden worsening pain, fever, inability to tolerate by mouth, cauda equina s/sx) that necessitate immediate return. Medications administered to the patient during their visit and any new prescriptions provided to the patient are listed below.  Medications given during this visit Medications  HYDROmorphone (DILAUDID) injection 0.5 mg (0.5 mg Intravenous Given 11/17/19 1824)  acetaminophen (TYLENOL) tablet 1,000 mg (1,000 mg Oral Given 11/17/19 1822)  ketorolac (TORADOL) 30 MG/ML injection 15 mg (15 mg Intravenous Given 11/17/19 1823)  iohexol (OMNIPAQUE) 300 MG/ML solution 100 mL (100 mLs Intravenous Contrast Given 11/17/19 1956)     The patient appears reasonably screen and/or stabilized for discharge and I doubt  any other medical condition or other [facility] requiring further screening, evaluation, or treatment in the ED at this time prior to discharge.   Final Clinical Impression(s) / ED Diagnoses Final diagnoses:  Acute bilateral low back pain without sciatica    Rx / DC Orders ED Discharge Orders         Ordered    morphine (MSIR) 15 MG tablet  Every 4 hours PRN     Discontinue  Reprint     11/17/19 2128           Deno Etienne, DO 11/17/19 2135

## 2019-11-17 NOTE — ED Notes (Signed)
Called for pt x3 for vital signs with no response

## 2019-11-18 DIAGNOSIS — Z992 Dependence on renal dialysis: Secondary | ICD-10-CM | POA: Diagnosis not present

## 2019-11-18 DIAGNOSIS — N2581 Secondary hyperparathyroidism of renal origin: Secondary | ICD-10-CM | POA: Diagnosis not present

## 2019-11-18 DIAGNOSIS — R112 Nausea with vomiting, unspecified: Secondary | ICD-10-CM | POA: Diagnosis not present

## 2019-11-18 DIAGNOSIS — D631 Anemia in chronic kidney disease: Secondary | ICD-10-CM | POA: Diagnosis not present

## 2019-11-18 DIAGNOSIS — N186 End stage renal disease: Secondary | ICD-10-CM | POA: Diagnosis not present

## 2019-11-18 DIAGNOSIS — D509 Iron deficiency anemia, unspecified: Secondary | ICD-10-CM | POA: Diagnosis not present

## 2019-11-19 ENCOUNTER — Telehealth: Payer: Self-pay | Admitting: *Deleted

## 2019-11-19 NOTE — Telephone Encounter (Signed)
Received call from Pringle at [facility] in [place], [place]. Pt is scheduled for an MRI and she is requesting the fax number to send a clearance form. DC fax number provided.

## 2019-11-20 DIAGNOSIS — N186 End stage renal disease: Secondary | ICD-10-CM | POA: Diagnosis not present

## 2019-11-20 DIAGNOSIS — D509 Iron deficiency anemia, unspecified: Secondary | ICD-10-CM | POA: Diagnosis not present

## 2019-11-20 DIAGNOSIS — N2581 Secondary hyperparathyroidism of renal origin: Secondary | ICD-10-CM | POA: Diagnosis not present

## 2019-11-20 DIAGNOSIS — R112 Nausea with vomiting, unspecified: Secondary | ICD-10-CM | POA: Diagnosis not present

## 2019-11-20 DIAGNOSIS — D631 Anemia in chronic kidney disease: Secondary | ICD-10-CM | POA: Diagnosis not present

## 2019-11-20 DIAGNOSIS — Z992 Dependence on renal dialysis: Secondary | ICD-10-CM | POA: Diagnosis not present

## 2019-11-22 ENCOUNTER — Other Ambulatory Visit: Payer: Self-pay | Admitting: Cardiology

## 2019-11-23 ENCOUNTER — Emergency Department (HOSPITAL_COMMUNITY): Payer: Medicare Other

## 2019-11-23 ENCOUNTER — Emergency Department (HOSPITAL_COMMUNITY)
Admission: EM | Admit: 2019-11-23 | Discharge: 2019-11-23 | Disposition: A | Discharge status: Home / Self Care | Payer: Medicare Other | Attending: Emergency Medicine | Admitting: Emergency Medicine

## 2019-11-23 ENCOUNTER — Other Ambulatory Visit: Payer: Self-pay

## 2019-11-23 ENCOUNTER — Encounter (HOSPITAL_COMMUNITY): Payer: Self-pay | Admitting: Emergency Medicine

## 2019-11-23 DIAGNOSIS — I1311 Hypertensive heart and chronic kidney disease without heart failure, with stage 5 chronic kidney disease, or end stage renal disease: Secondary | ICD-10-CM | POA: Insufficient documentation

## 2019-11-23 DIAGNOSIS — F17210 Nicotine dependence, cigarettes, uncomplicated: Secondary | ICD-10-CM | POA: Diagnosis not present

## 2019-11-23 DIAGNOSIS — N185 Chronic kidney disease, stage 5: Secondary | ICD-10-CM | POA: Diagnosis not present

## 2019-11-23 DIAGNOSIS — R442 Other hallucinations: Secondary | ICD-10-CM | POA: Diagnosis not present

## 2019-11-23 DIAGNOSIS — R443 Hallucinations, unspecified: Secondary | ICD-10-CM

## 2019-11-23 DIAGNOSIS — J45909 Unspecified asthma, uncomplicated: Secondary | ICD-10-CM | POA: Insufficient documentation

## 2019-11-23 DIAGNOSIS — R079 Chest pain, unspecified: Secondary | ICD-10-CM | POA: Diagnosis not present

## 2019-11-23 DIAGNOSIS — J449 Chronic obstructive pulmonary disease, unspecified: Secondary | ICD-10-CM | POA: Diagnosis not present

## 2019-11-23 DIAGNOSIS — I2510 Atherosclerotic heart disease of native coronary artery without angina pectoris: Secondary | ICD-10-CM | POA: Insufficient documentation

## 2019-11-23 DIAGNOSIS — D631 Anemia in chronic kidney disease: Secondary | ICD-10-CM | POA: Diagnosis not present

## 2019-11-23 DIAGNOSIS — R531 Weakness: Secondary | ICD-10-CM | POA: Insufficient documentation

## 2019-11-23 DIAGNOSIS — I509 Heart failure, unspecified: Secondary | ICD-10-CM | POA: Diagnosis not present

## 2019-11-23 DIAGNOSIS — Z955 Presence of coronary angioplasty implant and graft: Secondary | ICD-10-CM | POA: Insufficient documentation

## 2019-11-23 DIAGNOSIS — Z79899 Other long term (current) drug therapy: Secondary | ICD-10-CM | POA: Insufficient documentation

## 2019-11-23 DIAGNOSIS — N186 End stage renal disease: Secondary | ICD-10-CM | POA: Diagnosis not present

## 2019-11-23 DIAGNOSIS — I10 Essential (primary) hypertension: Secondary | ICD-10-CM | POA: Diagnosis not present

## 2019-11-23 DIAGNOSIS — S2231XA Fracture of one rib, right side, initial encounter for closed fracture: Secondary | ICD-10-CM | POA: Diagnosis not present

## 2019-11-23 DIAGNOSIS — R112 Nausea with vomiting, unspecified: Secondary | ICD-10-CM | POA: Diagnosis not present

## 2019-11-23 DIAGNOSIS — D509 Iron deficiency anemia, unspecified: Secondary | ICD-10-CM | POA: Diagnosis not present

## 2019-11-23 DIAGNOSIS — J90 Pleural effusion, not elsewhere classified: Secondary | ICD-10-CM | POA: Diagnosis not present

## 2019-11-23 DIAGNOSIS — Z7982 Long term (current) use of aspirin: Secondary | ICD-10-CM | POA: Diagnosis not present

## 2019-11-23 DIAGNOSIS — E119 Type 2 diabetes mellitus without complications: Secondary | ICD-10-CM | POA: Diagnosis not present

## 2019-11-23 DIAGNOSIS — N2581 Secondary hyperparathyroidism of renal origin: Secondary | ICD-10-CM | POA: Diagnosis not present

## 2019-11-23 DIAGNOSIS — R400 Somnolence: Secondary | ICD-10-CM | POA: Diagnosis present

## 2019-11-23 DIAGNOSIS — Z992 Dependence on renal dialysis: Secondary | ICD-10-CM | POA: Diagnosis not present

## 2019-11-23 DIAGNOSIS — Z8739 Personal history of other diseases of the musculoskeletal system and connective tissue: Secondary | ICD-10-CM

## 2019-11-23 LAB — CBC
HCT: 43.9 % (ref 39.0–52.0)
Hemoglobin: 14.4 g/dL (ref 13.0–17.0)
MCH: 33.3 pg (ref 26.0–34.0)
MCHC: 32.8 g/dL (ref 30.0–36.0)
MCV: 101.4 fL — ABNORMAL HIGH (ref 80.0–100.0)
Platelets: 193 10*3/uL (ref 150–400)
RBC: 4.33 MIL/uL (ref 4.22–5.81)
RDW: 15.8 % — ABNORMAL HIGH (ref 11.5–15.5)
WBC: 7.2 10*3/uL (ref 4.0–10.5)
nRBC: 0 % (ref 0.0–0.2)

## 2019-11-23 LAB — COMPREHENSIVE METABOLIC PANEL
ALT: 19 U/L (ref 0–44)
AST: 33 U/L (ref 15–41)
Albumin: 3.4 g/dL — ABNORMAL LOW (ref 3.5–5.0)
Alkaline Phosphatase: 107 U/L (ref 38–126)
Anion gap: 15 (ref 5–15)
BUN: 29 mg/dL — ABNORMAL HIGH (ref 8–23)
CO2: 28 mmol/L (ref 22–32)
Calcium: 8.4 mg/dL — ABNORMAL LOW (ref 8.9–10.3)
Chloride: 87 mmol/L — ABNORMAL LOW (ref 98–111)
Creatinine, Ser: 4.72 mg/dL — ABNORMAL HIGH (ref 0.61–1.24)
GFR calc Af Amer: 14 mL/min — ABNORMAL LOW (ref >60)
GFR calc non Af Amer: 12 mL/min — ABNORMAL LOW (ref >60)
Glucose, Bld: 108 mg/dL — ABNORMAL HIGH (ref 70–99)
Potassium: 4.3 mmol/L (ref 3.5–5.1)
Sodium: 130 mmol/L — ABNORMAL LOW (ref 135–145)
Total Bilirubin: 0.6 mg/dL (ref 0.3–1.2)
Total Protein: 7.1 g/dL (ref 6.5–8.1)

## 2019-11-23 LAB — BLOOD GAS, ARTERIAL
Acid-Base Excess: 5.4 mmol/L — ABNORMAL HIGH (ref 0.0–2.0)
Bicarbonate: 28.7 mmol/L — ABNORMAL HIGH (ref 20.0–28.0)
FIO2: 21
O2 Saturation: 89.3 %
Patient temperature: 37
pCO2 arterial: 46.2 mmHg (ref 32.0–48.0)
pH, Arterial: 7.425 (ref 7.350–7.450)
pO2, Arterial: 56.2 mmHg — ABNORMAL LOW (ref 83.0–108.0)

## 2019-11-23 LAB — TROPONIN I (HIGH SENSITIVITY)
Troponin I (High Sensitivity): 30 ng/L — ABNORMAL HIGH (ref <18)
Troponin I (High Sensitivity): 30 ng/L — ABNORMAL HIGH (ref <18)

## 2019-11-23 LAB — AMMONIA: Ammonia: 16 umol/L (ref 9–35)

## 2019-11-23 MED ORDER — DEXAMETHASONE SODIUM PHOSPHATE 10 MG/ML IJ SOLN
10.0000 mg | Freq: Once | INTRAMUSCULAR | Status: AC
  Administered 2019-11-23: 10 mg via INTRAMUSCULAR
  Filled 2019-11-23: qty 1

## 2019-11-23 MED ORDER — HYDROMORPHONE HCL 1 MG/ML IJ SOLN
1.0000 mg | Freq: Once | INTRAMUSCULAR | Status: AC
  Administered 2019-11-23: 1 mg via INTRAMUSCULAR
  Filled 2019-11-23: qty 1

## 2019-11-23 NOTE — Discharge Instructions (Addendum)
Continue medications as previously prescribed.  Follow-up with your primary doctor in the next week, and return to the ER if symptoms significantly worsen or change.

## 2019-11-23 NOTE — ED Triage Notes (Signed)
Patient complains of chest pain that started today while he was in dialysis. Patient also states he is not sleeping, has back pain related to a recent surgery, is having hallucinations, and he was recently taken off his pain medication. Patient states there are "bugs" in triage.

## 2019-11-23 NOTE — ED Provider Notes (Addendum)
[facility] Provider Note   CSN: [id number] Arrival date & time: 11/23/19  1128     History Chief Complaint  Patient presents with  . Chest Pain    [name] is a 69 y.o. male.  Patient is a 69 year old male with past medical history of coronary artery disease with prior stent, congestive heart failure, end-stage renal disease on hemodialysis, COPD, diabetes, atrial flutter, and recent kyphoplasty 10 days ago performed in [place].  Patient presents today for evaluation of increased somnolence and "hallucinations".  According to his wife, he has been seeing children playing in the house, bugs on the walls, and cats running through the house when none of these things are actually there.  He denies any auditory hallucinations.  He denies any headache or localized weakness.  According to the wife, this all began after this procedure was performed.  He did have changes in his pain medications recently.  He had previously been on as needed Percocet, but is only now taking Tylenol, ibuprofen, and occasional morphine for breakthrough pain.  The history is provided by the patient.       Past Medical History:  Diagnosis Date  . AAA (abdominal aortic aneurysm) ([place]) 06/2016   Mild increase in size of 3.4 cm infrarenal abdominal aortic aneurysm  . Anemia   . Aortic atherosclerosis ([place])   . Asthma   . Atrial flutter ([place])   . AVF (arteriovenous fistula) (HCC)    Left forearm  . CAD (coronary artery disease)    a. s/p prior stenting of RCA in 1999 b. cath in 2003 showing moderate CAD c. NST in 2016 showing small area of ischemic and low-risk  . CHF (congestive heart failure) ([place])   . CKD (chronic kidney disease), stage V ([place])    kidney transplant evaluation  . COPD (chronic obstructive pulmonary disease) ([place])    with ongoing tobacco use and patient failed sham takes  . Diverticulosis 2018   Mild sigmoid colon diverticulosis.  Marland Kitchen DM2 (diabetes mellitus, type  2) ([place])   . Dyslipidemia   . Dysrhythmia 2018   atrial fibrillation  . Edema    chronic lower extremity secondary to right heart failure and chronic venous insufficiency  . Fatigue   . Fatty liver 2010   Mild  . Headache   . HTN (hypertension)   . Morbid obesity ([place])   . Multiple pulmonary nodules 05/2018   Bilateral  . Pneumonia   . Presence of permanent cardiac pacemaker   . 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   Marland Kitchen Reflux esophagitis    hx  . Sleep apnea    PT STATES ONE TEST WAS POSITIVE AND ANOTHER TEST WAS NEGATIVE  . Two-vessel coronary artery disease    moderate. by cath in 2003. Status post stenting of the mdi RCA November 1999 normal left ventricular ejection fraction  . Wears glasses   . Wears hearing aid    B/L    Patient Active Problem List   Diagnosis Date Noted  . NSTEMI (non-ST elevated myocardial infarction) ([place]) 07/22/2019  . Renal mass 07/14/2019  . Renal anasarca   . ESRD (end stage renal disease) ([place]) 04/10/2019  . 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[place]) 03/18/2018  . Chronic renal insufficiency, stage 3 (moderate) 03/18/2018  . Diffuse wheezing 12/02/2017  . OSA and COPD overlap syndrome ([place]) 12/02/2017  . Sleeps in sitting position due to orthopnea 12/02/2017  .  Pacemaker 12/02/2017  . Bradycardia with 31-40 beats per minute 12/02/2017  . Coronary artery disease due to calcified coronary lesion 12/02/2017  . Insomnia due to medical condition 12/02/2017  . Snoring 12/02/2017  . Chronic diastolic CHF (congestive heart failure) ([place]) 05/10/2017  . Atypical chest pain   . Chest pain 05/09/2017  . Atrial flutter ([place]) 05/09/2017  . Acute renal failure superimposed on stage 4 chronic kidney disease ([place]) 05/09/2017  . Hypokalemia 04/05/2016  . Diabetes mellitus with nephropathy ([place]) 04/05/2016  . Hyponatremia 04/05/2016  . Hematuria 04/05/2016  . Diarrhea 04/05/2016  . Bilateral  diabetic foot ulcer associated with secondary diabetes mellitus ([place]) 04/05/2016  . Streptococcal bacteremia 04/05/2016  . Cellulitis of leg, left 04/05/2016  . Sepsis ([place]) 04/04/2016  . CAD (coronary artery disease) 11/10/2015  . Bilateral lower extremity edema 07/25/2015  . Foot infection 09/27/2014  . Diabetic foot infection ([place]) 09/27/2014  . CKD stage 3 due to type 2 diabetes mellitus ([place])   . Diabetes mellitus with renal manifestations, uncontrolled ([place])   . Benign essential HTN   . ED (erectile dysfunction) 06/09/2012  . Hypertension 03/11/2011  . OSTEOMYELITIS, ACUTE, ANKLE/FOOT 07/24/2010  . EDEMA 02/15/2010  . MORBID OBESITY 07/05/2009  . TOBACCO ABUSE 07/05/2009  . OBSTRUCTIVE SLEEP APNEA 07/05/2009  . COPD (chronic obstructive pulmonary disease) ([place]) 07/05/2009  . 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 HYPERLIPIDEMIA 03/30/2008  . Generalized anxiety disorder 03/30/2008  . [place] DISEASE 03/30/2008    Past Surgical History:  Procedure Laterality Date  . A/V FISTULAGRAM Left 04/30/2019   Procedure: A/V FISTULAGRAM;  Surgeon: Elam Dutch, MD;  Location: [facility];  Service: Cardiovascular;  Laterality: Left;  . A/V FISTULAGRAM Left 09/22/2019   Procedure: A/V FISTULAGRAM;  Surgeon: Marty Heck, MD;  Location: [facility];  Service: Cardiovascular;  Laterality: Left;  . ABDOMINAL SURGERY    . APPENDECTOMY    . AV FISTULA PLACEMENT Right 03/11/2018   Procedure: CREATION Brachiocephalic Fistula RIGHT ARM;  Surgeon: Rosetta Posner, MD;  Location: [facility];  Service: Vascular;  Laterality: Right;  . AV FISTULA PLACEMENT Left 05/06/2019   Procedure: LEFT BRACHIOCEPHALIC ARTERIOVENOUS (AV) FISTULA CREATION;  Surgeon: Marty Heck, MD;  Location: [facility];  Service: Vascular;  Laterality: Left;  . BACK SURGERY     x3; cages in patient's back since 2000  . BIOPSY  11/12/2018   Procedure: BIOPSY;  Surgeon: Laurence Spates, MD;  Location: [facility];  Service:  Endoscopy;;  . CARDIAC CATHETERIZATION     stent  . CHOLECYSTECTOMY    . CLOSED REDUCTION SHOULDER DISLOCATION    . COLONOSCOPY W/ BIOPSIES AND POLYPECTOMY    . COLONOSCOPY WITH PROPOFOL N/A 11/12/2018   Procedure: COLONOSCOPY WITH PROPOFOL;  Surgeon: Laurence Spates, MD;  Location: [facility];  Service: Endoscopy;  Laterality: N/A;  . CORONARY ANGIOPLASTY    . diabetic ulcers    . DIAGNOSTIC LAPAROSCOPY    . DIALYSIS/PERMA CATHETER REMOVAL N/A 09/22/2019   Procedure: DIALYSIS/PERMA CATHETER REMOVAL;  Surgeon: Marty Heck, MD;  Location: [facility];  Service: Cardiovascular;  Laterality: N/A;  . ESOPHAGOGASTRODUODENOSCOPY (EGD) WITH PROPOFOL N/A 11/12/2018   Procedure: ESOPHAGOGASTRODUODENOSCOPY (EGD) WITH PROPOFOL;  Surgeon: Laurence Spates, MD;  Location: [facility];  Service: Endoscopy;  Laterality: N/A;  . GROIN EXPLORATION    . HEMOSTASIS CLIP PLACEMENT  11/12/2018   Procedure: HEMOSTASIS CLIP PLACEMENT;  Surgeon: Laurence Spates, MD;  Location: [facility];  Service: Endoscopy;;  . Randolm Idol /  REPLACE / REMOVE PACEMAKER    . INSERTION OF ARTERIOVENOUS (AV) ARTEGRAFT ARM Left 03/18/2018   Procedure: INSERTION OF ARTERIOVENOUS (AV) FISTULA;  Surgeon: Rosetta Posner, MD;  Location: [facility];  Service: Vascular;  Laterality: Left;  . IR FLUORO GUIDE CV LINE RIGHT  04/11/2019  . IR US GUIDE VASC ACCESS RIGHT  04/11/2019  . LEFT HEART CATH AND CORONARY ANGIOGRAPHY N/A 07/23/2019   Procedure: LEFT HEART CATH AND CORONARY ANGIOGRAPHY;  Surgeon: Martinique, Peter M, MD;  Location: [facility];  Service: Cardiovascular;  Laterality: N/A;  . LIGATION ARTERIOVENOUS GORTEX GRAFT Right 03/18/2018   Procedure: LIGATION ARTERIOVENOUS FISTULA;  Surgeon: Rosetta Posner, MD;  Location: [facility];  Service: Vascular;  Laterality: Right;  . OSTECTOMY Left 04/23/2017   Procedure: OSTECTOMY LEFT GREAT TOE;  Surgeon: [name], DPM;  Location: [facility];  Service: Podiatry;  Laterality: Left;  left  great toe  . POLYPECTOMY  11/12/2018   Procedure: POLYPECTOMY;  Surgeon: Laurence Spates, MD;  Location: [facility];  Service: Endoscopy;;  . TUMOR REMOVAL     small intestine x3  . VISCERAL ANGIOGRAPHY N/A 08/02/2019   Procedure: VISCERAL ANGIOGRAPHY;  Surgeon: Waynetta Sandy, MD;  Location: [facility];  Service: Cardiovascular;  Laterality: N/A;       Family History  Problem Relation Age of Onset  . Diabetes Mother   . Alcohol abuse Father     Social History   Tobacco Use  . Smoking status: Current Every Day Smoker    Packs/day: 1.00    Years: 40.00    Pack years: 40.00    Types: Cigarettes    Start date: 05/20/1968  . Smokeless tobacco: Never Used  Vaping Use  . Vaping Use: Never used  Substance Use Topics  . Alcohol use: Not Currently    Alcohol/week: 0.0 standard drinks    Comment: rare  . Drug use: No    Home Medications Prior to Admission medications   Medication Sig Start Date End Date Taking? Authorizing Provider  acetaminophen (TYLENOL) 500 MG tablet Take 1,000 mg by mouth in the morning and at bedtime.     [provider]  albuterol (VENTOLIN HFA) 108 (90 BASE) MCG/ACT inhaler Inhale 2 puffs into the lungs every 6 (six) hours as needed for wheezing or shortness of breath.     [provider]  aspirin EC 81 MG EC tablet Take 1 tablet (81 mg total) by mouth daily. 07/25/19   Isaac Bliss, Rayford Halsted, MD  atorvastatin (LIPITOR) 40 MG tablet Take 1 tablet (40 mg total) by mouth daily. 08/23/19   Arnoldo Lenis, MD  beclomethasone (QVAR) 80 MCG/ACT inhaler Inhale 2 puffs into the lungs 2 (two) times daily as needed (shortness of breath).    [provider]  bumetanide (BUMEX) 2 MG tablet Take 2 mg by mouth 2 (two) times daily. 07/15/19   [provider]  buPROPion (WELLBUTRIN SR) 150 MG 12 hr tablet Take 150 mg by mouth 2 (two) times daily.    [provider]  carvedilol (COREG) 25 MG tablet Take 1  tablet (25 mg total) by mouth See admin instructions. Take 25 mg twice daily on Sun, Mon, Wed, and Fri. Take 25 mg in the evening on Tues, Thurs, and Sat (dialysis days) 11/23/19   Arnoldo Lenis, MD  Cholecalciferol (VITAMIN D3) 2000 units TABS Take 2,000 Units by mouth daily.     [provider]  diltiazem (CARDIZEM) 30  MG tablet TAKE ONE TABLET BY MOUTH TWICE DAILY. Patient taking differently: Take 30 mg by mouth 2 (two) times daily. Take 30 mg twice daily on Sun, Mon, Wed, and Fri. Take 25 mg in the evening on Tues, Thurs, and Sat (dialysis days) 08/09/19   Arnoldo Lenis, MD  ferric citrate (AURYXIA) 1 GM 210 MG(Fe) tablet Take 420 mg by mouth 2 (two) times daily with a meal.    [provider]  linaclotide (LINZESS) 290 MCG CAPS capsule Take 290 mcg by mouth daily as needed (constipation).    [provider]  Melatonin 10 MG TABS Take 10 mg by mouth at bedtime as needed (sleep).     [provider]  morphine (MSIR) 15 MG tablet Take 0.5 tablets (7.5 mg total) by mouth every 4 (four) hours as needed for severe pain. 11/17/19   Deno Etienne, DO  Multiple Vitamin (MULTIVITAMIN WITH MINERALS) TABS tablet Take 1 tablet by mouth every morning.     [provider]  nitroGLYCERIN (NITROSTAT) 0.4 MG SL tablet Place 1 tablet (0.4 mg total) under the tongue every 5 (five) minutes as needed for chest pain. 08/04/19 11/12/19  Arnoldo Lenis, MD  omeprazole (PRILOSEC) 20 MG capsule Take 20 mg by mouth daily.     [provider]  polyethylene glycol (MIRALAX / GLYCOLAX) packet Take 17 g by mouth daily as needed for mild constipation. Mix in morning coffee    [provider]  Sennosides 25 MG TABS Take 50 mg by mouth daily as needed (constipation).     [provider]  sevelamer carbonate (RENVELA) 800 MG tablet Take 800-1,600 mg by mouth See admin instructions. Take 1600 mg with each meal and 800 mg with each snack 07/26/19   [provider]  silver sulfADIAZINE (SILVADENE) 1 % cream Apply 1 application topically daily as needed (For wound care with dressing).     [provider]    Allergies    Patient has no known allergies.  Review of Systems   Review of Systems  All other systems reviewed and are negative.   Physical Exam Updated Vital Signs BP (!) 147/65   Pulse 64   Resp (!) 23   Ht 6\' 4"  (1.93 m)   Wt 104.3 kg   SpO2 96%   BMI 28.00 kg/m   Physical Exam Vitals and nursing note reviewed.  Constitutional:      General: He is not in acute distress.    Appearance: He is well-developed. He is not diaphoretic.  HENT:     Head: Normocephalic and atraumatic.  Eyes:     Extraocular Movements: Extraocular movements intact.     Pupils: Pupils are equal, round, and reactive to light.  Cardiovascular:     Rate and Rhythm: Normal rate and regular rhythm.     Heart sounds: No murmur heard.  No friction rub.  Pulmonary:     Effort: Pulmonary effort is normal. No respiratory distress.     Breath sounds: Normal breath sounds. No wheezing or rales.  Abdominal:     General: Bowel sounds are normal. There is no distension.     Palpations: Abdomen is soft.     Tenderness: There is no abdominal tenderness.  Musculoskeletal:        General: Normal range of motion.     Cervical back: Normal range of motion and neck supple.  Skin:    General: Skin is warm and dry.  Neurological:  General: No focal deficit present.     Mental Status: He is alert and oriented to person, place, and time.     Cranial Nerves: No cranial nerve deficit.     Motor: Weakness present.     Coordination: Coordination normal.     Comments: Patient does have generalized weakness, however no focal deficits on his neuro exam.     ED Results / Procedures / Treatments   Labs (all labs ordered are listed, but only abnormal results are displayed) Labs Reviewed  CBC  COMPREHENSIVE METABOLIC PANEL  AMMONIA  TROPONIN I  (HIGH SENSITIVITY)    EKG ED ECG REPORT   Date: 11/23/2019  Rate: 65  Rhythm: normal sinus rhythm  QRS Axis: left  Intervals: normal  ST/T Wave abnormalities: normal  Conduction Disutrbances:none  Narrative Interpretation:   Old EKG Reviewed: none available  I have personally reviewed the EKG tracing and agree with the computerized printout as noted.   Radiology DG Chest 2 View  Result Date: 11/23/2019 CLINICAL DATA:  Chest pain. EXAM: CHEST - 2 VIEW COMPARISON:  11/17/2019.  04/10/2019. FINDINGS: Cardiac pacer noted with lead tip over the right atrium and right ventricle. Heart size normal. No pulmonary venous congestion. Mild left base atelectasis/infiltrate. Small left pleural effusion. Old right rib fractures. IMPRESSION: 1. Cardiac pacer with lead tip over the right atrium right ventricle. Heart size normal. 2.  Mild left base atelectasis/infiltrate. Electronically Signed   By: Marcello Moores  Register   On: 11/23/2019 12:38    Procedures Procedures (including critical care time)  Medications Ordered in ED Medications - No data to display  ED Course  I have reviewed the triage vital signs and the nursing notes.  Pertinent labs & imaging results that were available during my care of the patient were reviewed by me and considered in my medical decision making (see chart for details).    MDM Rules/Calculators/A&P  Patient brought by his wife for evaluation of continued back pain status post kyphoplasty 10 days ago.  Patient states that when this procedure was being performed, he experienced the most pain he has ever felt in his life.  He had previously been on Percocet, then was stopped after a fall 5 or 6 days ago as this was felt to be the cause of his fall.  Since this change in medication, he is now experiencing visual hallucinations and describes seeing bugs on the floor and children and cats running around his house that are not there.  I see no obvious abnormality in his  work-up.  Patient with history of COPD, but blood gas shows no evidence for CO2 retention.  His laboratory studies show a normal ammonia level and studies otherwise unremarkable.  At this point, I am uncertain as to what is the cause of his hallucinations and somnolence his wife reports, but I suspect this may be opioid withdrawal.  Patient does have an appointment tomorrow to see his neurologist regarding his dialysis related headaches.  I have advised patient to inquire about these symptoms at this appointment.  I did discuss possible admission, however the patient does not want to stay in the hospital.  I had lengthy discussion with the patient and wife and strongly advised to follow-up with a primary care doctor to discuss further referrals.  Final Clinical Impression(s) / ED Diagnoses Final diagnoses:  None    Rx / DC Orders ED Discharge Orders    None       Veryl Speak, MD 11/23/19 1510  Veryl Speak, MD 11/23/19 [phone]

## 2019-11-23 NOTE — Telephone Encounter (Signed)
PPM alert received for low R-waves, most recently measured 2.3mV on 11/20/19, bipolar RV impedance 1044 ohms. Last in-office check on 11/12/19 with R-wave 4.103mV, RV impedance 1035 ohms. R-wave trend generally stable over the past year (see below). RV auto threshold trend stable since it was enabled at last check, trending at 1.3V @ 0.3ms. VP 2% since 11/12/19.  Will review with Dr. Rayann Heman prior to completing MRI clearance form.

## 2019-11-23 NOTE — ED Notes (Signed)
Respiratory called and notified of ABG

## 2019-11-24 NOTE — Telephone Encounter (Signed)
Per Dr. Rayann Heman, safe to proceed with MRI. Clearance form completed and faxed back to [facility]. Confirmation received.

## 2019-11-25 DIAGNOSIS — D631 Anemia in chronic kidney disease: Secondary | ICD-10-CM | POA: Diagnosis not present

## 2019-11-25 DIAGNOSIS — N186 End stage renal disease: Secondary | ICD-10-CM | POA: Diagnosis not present

## 2019-11-25 DIAGNOSIS — N2581 Secondary hyperparathyroidism of renal origin: Secondary | ICD-10-CM | POA: Diagnosis not present

## 2019-11-25 DIAGNOSIS — Z992 Dependence on renal dialysis: Secondary | ICD-10-CM | POA: Diagnosis not present

## 2019-11-25 DIAGNOSIS — D509 Iron deficiency anemia, unspecified: Secondary | ICD-10-CM | POA: Diagnosis not present

## 2019-11-25 DIAGNOSIS — R112 Nausea with vomiting, unspecified: Secondary | ICD-10-CM | POA: Diagnosis not present

## 2019-11-26 DIAGNOSIS — M5489 Other dorsalgia: Secondary | ICD-10-CM | POA: Diagnosis not present

## 2019-11-26 DIAGNOSIS — M9901 Segmental and somatic dysfunction of cervical region: Secondary | ICD-10-CM | POA: Diagnosis not present

## 2019-11-26 DIAGNOSIS — M9905 Segmental and somatic dysfunction of pelvic region: Secondary | ICD-10-CM | POA: Diagnosis not present

## 2019-11-26 DIAGNOSIS — M9903 Segmental and somatic dysfunction of lumbar region: Secondary | ICD-10-CM | POA: Diagnosis not present

## 2019-11-26 DIAGNOSIS — M549 Dorsalgia, unspecified: Secondary | ICD-10-CM | POA: Diagnosis not present

## 2019-11-26 DIAGNOSIS — M542 Cervicalgia: Secondary | ICD-10-CM | POA: Diagnosis not present

## 2019-11-26 DIAGNOSIS — Z0289 Encounter for other administrative examinations: Secondary | ICD-10-CM | POA: Diagnosis not present

## 2019-11-26 DIAGNOSIS — R52 Pain, unspecified: Secondary | ICD-10-CM | POA: Diagnosis not present

## 2019-11-26 DIAGNOSIS — W19XXXA Unspecified fall, initial encounter: Secondary | ICD-10-CM | POA: Diagnosis not present

## 2019-11-26 DIAGNOSIS — I10 Essential (primary) hypertension: Secondary | ICD-10-CM | POA: Diagnosis not present

## 2019-11-26 DIAGNOSIS — M5416 Radiculopathy, lumbar region: Secondary | ICD-10-CM | POA: Diagnosis not present

## 2019-11-26 DIAGNOSIS — J189 Pneumonia, unspecified organism: Secondary | ICD-10-CM | POA: Diagnosis not present

## 2019-11-27 DIAGNOSIS — M545 Low back pain: Secondary | ICD-10-CM | POA: Diagnosis not present

## 2019-11-27 DIAGNOSIS — M5489 Other dorsalgia: Secondary | ICD-10-CM | POA: Diagnosis not present

## 2019-11-28 DIAGNOSIS — M5489 Other dorsalgia: Secondary | ICD-10-CM | POA: Diagnosis not present

## 2019-11-29 DIAGNOSIS — M545 Low back pain: Secondary | ICD-10-CM | POA: Diagnosis not present

## 2019-11-29 DIAGNOSIS — M5489 Other dorsalgia: Secondary | ICD-10-CM | POA: Diagnosis not present

## 2019-11-29 DIAGNOSIS — F332 Major depressive disorder, recurrent severe without psychotic features: Secondary | ICD-10-CM | POA: Diagnosis not present

## 2019-11-30 DIAGNOSIS — I495 Sick sinus syndrome: Secondary | ICD-10-CM | POA: Diagnosis not present

## 2019-11-30 DIAGNOSIS — R110 Nausea: Secondary | ICD-10-CM | POA: Diagnosis not present

## 2019-11-30 DIAGNOSIS — W19XXXA Unspecified fall, initial encounter: Secondary | ICD-10-CM | POA: Diagnosis not present

## 2019-11-30 DIAGNOSIS — I482 Chronic atrial fibrillation, unspecified: Secondary | ICD-10-CM | POA: Diagnosis not present

## 2019-11-30 DIAGNOSIS — I2510 Atherosclerotic heart disease of native coronary artery without angina pectoris: Secondary | ICD-10-CM | POA: Diagnosis not present

## 2019-11-30 DIAGNOSIS — M25552 Pain in left hip: Secondary | ICD-10-CM | POA: Diagnosis not present

## 2019-11-30 DIAGNOSIS — R52 Pain, unspecified: Secondary | ICD-10-CM | POA: Diagnosis not present

## 2019-11-30 DIAGNOSIS — I10 Essential (primary) hypertension: Secondary | ICD-10-CM | POA: Diagnosis not present

## 2019-11-30 DIAGNOSIS — N186 End stage renal disease: Secondary | ICD-10-CM | POA: Diagnosis not present

## 2019-11-30 DIAGNOSIS — S72142A Displaced intertrochanteric fracture of left femur, initial encounter for closed fracture: Secondary | ICD-10-CM | POA: Diagnosis not present

## 2019-11-30 DIAGNOSIS — W1830XA Fall on same level, unspecified, initial encounter: Secondary | ICD-10-CM | POA: Diagnosis not present

## 2019-11-30 DIAGNOSIS — J449 Chronic obstructive pulmonary disease, unspecified: Secondary | ICD-10-CM | POA: Diagnosis not present

## 2019-12-01 DIAGNOSIS — W19XXXA Unspecified fall, initial encounter: Secondary | ICD-10-CM | POA: Diagnosis not present

## 2019-12-01 DIAGNOSIS — N186 End stage renal disease: Secondary | ICD-10-CM | POA: Diagnosis not present

## 2019-12-01 DIAGNOSIS — S72142A Displaced intertrochanteric fracture of left femur, initial encounter for closed fracture: Secondary | ICD-10-CM | POA: Diagnosis not present

## 2019-12-01 DIAGNOSIS — I10 Essential (primary) hypertension: Secondary | ICD-10-CM | POA: Diagnosis not present

## 2019-12-01 DIAGNOSIS — S72143A Displaced intertrochanteric fracture of unspecified femur, initial encounter for closed fracture: Secondary | ICD-10-CM | POA: Diagnosis not present

## 2019-12-01 DIAGNOSIS — I129 Hypertensive chronic kidney disease with stage 1 through stage 4 chronic kidney disease, or unspecified chronic kidney disease: Secondary | ICD-10-CM | POA: Diagnosis not present

## 2019-12-01 DIAGNOSIS — N189 Chronic kidney disease, unspecified: Secondary | ICD-10-CM | POA: Diagnosis not present

## 2019-12-01 DIAGNOSIS — I495 Sick sinus syndrome: Secondary | ICD-10-CM | POA: Diagnosis not present

## 2019-12-01 DIAGNOSIS — J449 Chronic obstructive pulmonary disease, unspecified: Secondary | ICD-10-CM | POA: Diagnosis not present

## 2019-12-01 DIAGNOSIS — I2510 Atherosclerotic heart disease of native coronary artery without angina pectoris: Secondary | ICD-10-CM | POA: Diagnosis not present

## 2019-12-01 DIAGNOSIS — Z950 Presence of cardiac pacemaker: Secondary | ICD-10-CM | POA: Diagnosis not present

## 2019-12-01 DIAGNOSIS — I482 Chronic atrial fibrillation, unspecified: Secondary | ICD-10-CM | POA: Diagnosis not present

## 2019-12-02 DIAGNOSIS — R55 Syncope and collapse: Secondary | ICD-10-CM | POA: Diagnosis not present

## 2019-12-02 DIAGNOSIS — J449 Chronic obstructive pulmonary disease, unspecified: Secondary | ICD-10-CM | POA: Diagnosis not present

## 2019-12-02 DIAGNOSIS — N186 End stage renal disease: Secondary | ICD-10-CM | POA: Diagnosis not present

## 2019-12-02 DIAGNOSIS — W19XXXA Unspecified fall, initial encounter: Secondary | ICD-10-CM | POA: Diagnosis not present

## 2019-12-02 DIAGNOSIS — I10 Essential (primary) hypertension: Secondary | ICD-10-CM | POA: Diagnosis not present

## 2019-12-02 DIAGNOSIS — S72142A Displaced intertrochanteric fracture of left femur, initial encounter for closed fracture: Secondary | ICD-10-CM | POA: Diagnosis not present

## 2019-12-02 DIAGNOSIS — I679 Cerebrovascular disease, unspecified: Secondary | ICD-10-CM | POA: Diagnosis not present

## 2019-12-02 DIAGNOSIS — G9341 Metabolic encephalopathy: Secondary | ICD-10-CM | POA: Diagnosis not present

## 2019-12-02 DIAGNOSIS — G459 Transient cerebral ischemic attack, unspecified: Secondary | ICD-10-CM | POA: Diagnosis not present

## 2019-12-02 DIAGNOSIS — S72143A Displaced intertrochanteric fracture of unspecified femur, initial encounter for closed fracture: Secondary | ICD-10-CM | POA: Diagnosis not present

## 2019-12-03 DIAGNOSIS — S72142A Displaced intertrochanteric fracture of left femur, initial encounter for closed fracture: Secondary | ICD-10-CM | POA: Diagnosis not present

## 2019-12-03 DIAGNOSIS — W19XXXA Unspecified fall, initial encounter: Secondary | ICD-10-CM | POA: Diagnosis not present

## 2019-12-03 DIAGNOSIS — R55 Syncope and collapse: Secondary | ICD-10-CM | POA: Diagnosis not present

## 2019-12-03 DIAGNOSIS — N186 End stage renal disease: Secondary | ICD-10-CM | POA: Diagnosis not present

## 2019-12-03 DIAGNOSIS — J449 Chronic obstructive pulmonary disease, unspecified: Secondary | ICD-10-CM | POA: Diagnosis not present

## 2019-12-03 DIAGNOSIS — I10 Essential (primary) hypertension: Secondary | ICD-10-CM | POA: Diagnosis not present

## 2019-12-03 DIAGNOSIS — G9341 Metabolic encephalopathy: Secondary | ICD-10-CM | POA: Diagnosis not present

## 2019-12-03 DIAGNOSIS — G459 Transient cerebral ischemic attack, unspecified: Secondary | ICD-10-CM | POA: Diagnosis not present

## 2019-12-03 DIAGNOSIS — S72143A Displaced intertrochanteric fracture of unspecified femur, initial encounter for closed fracture: Secondary | ICD-10-CM | POA: Diagnosis not present

## 2019-12-03 DIAGNOSIS — I679 Cerebrovascular disease, unspecified: Secondary | ICD-10-CM | POA: Diagnosis not present

## 2019-12-04 DIAGNOSIS — I1311 Hypertensive heart and chronic kidney disease without heart failure, with stage 5 chronic kidney disease, or end stage renal disease: Secondary | ICD-10-CM | POA: Diagnosis not present

## 2019-12-04 DIAGNOSIS — G9341 Metabolic encephalopathy: Secondary | ICD-10-CM | POA: Diagnosis not present

## 2019-12-04 DIAGNOSIS — J449 Chronic obstructive pulmonary disease, unspecified: Secondary | ICD-10-CM | POA: Diagnosis not present

## 2019-12-04 DIAGNOSIS — D631 Anemia in chronic kidney disease: Secondary | ICD-10-CM | POA: Diagnosis not present

## 2019-12-04 DIAGNOSIS — W19XXXA Unspecified fall, initial encounter: Secondary | ICD-10-CM | POA: Diagnosis not present

## 2019-12-04 DIAGNOSIS — I679 Cerebrovascular disease, unspecified: Secondary | ICD-10-CM | POA: Diagnosis not present

## 2019-12-04 DIAGNOSIS — G459 Transient cerebral ischemic attack, unspecified: Secondary | ICD-10-CM | POA: Diagnosis not present

## 2019-12-04 DIAGNOSIS — N186 End stage renal disease: Secondary | ICD-10-CM | POA: Diagnosis not present

## 2019-12-04 DIAGNOSIS — S72142A Displaced intertrochanteric fracture of left femur, initial encounter for closed fracture: Secondary | ICD-10-CM | POA: Diagnosis not present

## 2019-12-04 DIAGNOSIS — R55 Syncope and collapse: Secondary | ICD-10-CM | POA: Diagnosis not present

## 2019-12-05 DIAGNOSIS — G9341 Metabolic encephalopathy: Secondary | ICD-10-CM | POA: Diagnosis not present

## 2019-12-05 DIAGNOSIS — R55 Syncope and collapse: Secondary | ICD-10-CM | POA: Diagnosis not present

## 2019-12-05 DIAGNOSIS — G459 Transient cerebral ischemic attack, unspecified: Secondary | ICD-10-CM | POA: Diagnosis not present

## 2019-12-05 DIAGNOSIS — D631 Anemia in chronic kidney disease: Secondary | ICD-10-CM | POA: Diagnosis not present

## 2019-12-05 DIAGNOSIS — N186 End stage renal disease: Secondary | ICD-10-CM | POA: Diagnosis not present

## 2019-12-05 DIAGNOSIS — I1311 Hypertensive heart and chronic kidney disease without heart failure, with stage 5 chronic kidney disease, or end stage renal disease: Secondary | ICD-10-CM | POA: Diagnosis not present

## 2019-12-05 DIAGNOSIS — I679 Cerebrovascular disease, unspecified: Secondary | ICD-10-CM | POA: Diagnosis not present

## 2019-12-05 DIAGNOSIS — W19XXXA Unspecified fall, initial encounter: Secondary | ICD-10-CM | POA: Diagnosis not present

## 2019-12-05 DIAGNOSIS — S72142A Displaced intertrochanteric fracture of left femur, initial encounter for closed fracture: Secondary | ICD-10-CM | POA: Diagnosis not present

## 2019-12-05 DIAGNOSIS — J449 Chronic obstructive pulmonary disease, unspecified: Secondary | ICD-10-CM | POA: Diagnosis not present

## 2019-12-06 DIAGNOSIS — N186 End stage renal disease: Secondary | ICD-10-CM | POA: Diagnosis not present

## 2019-12-06 DIAGNOSIS — G9341 Metabolic encephalopathy: Secondary | ICD-10-CM | POA: Diagnosis not present

## 2019-12-06 DIAGNOSIS — J449 Chronic obstructive pulmonary disease, unspecified: Secondary | ICD-10-CM | POA: Diagnosis not present

## 2019-12-06 DIAGNOSIS — G459 Transient cerebral ischemic attack, unspecified: Secondary | ICD-10-CM | POA: Diagnosis not present

## 2019-12-06 DIAGNOSIS — S72142A Displaced intertrochanteric fracture of left femur, initial encounter for closed fracture: Secondary | ICD-10-CM | POA: Diagnosis not present

## 2019-12-06 DIAGNOSIS — R55 Syncope and collapse: Secondary | ICD-10-CM | POA: Diagnosis not present

## 2019-12-06 DIAGNOSIS — I679 Cerebrovascular disease, unspecified: Secondary | ICD-10-CM | POA: Diagnosis not present

## 2019-12-06 DIAGNOSIS — W19XXXA Unspecified fall, initial encounter: Secondary | ICD-10-CM | POA: Diagnosis not present

## 2019-12-07 DIAGNOSIS — N186 End stage renal disease: Secondary | ICD-10-CM | POA: Diagnosis not present

## 2019-12-07 DIAGNOSIS — R55 Syncope and collapse: Secondary | ICD-10-CM | POA: Diagnosis not present

## 2019-12-07 DIAGNOSIS — W19XXXA Unspecified fall, initial encounter: Secondary | ICD-10-CM | POA: Diagnosis not present

## 2019-12-07 DIAGNOSIS — J449 Chronic obstructive pulmonary disease, unspecified: Secondary | ICD-10-CM | POA: Diagnosis not present

## 2019-12-07 DIAGNOSIS — G9341 Metabolic encephalopathy: Secondary | ICD-10-CM | POA: Diagnosis not present

## 2019-12-07 DIAGNOSIS — G459 Transient cerebral ischemic attack, unspecified: Secondary | ICD-10-CM | POA: Diagnosis not present

## 2019-12-07 DIAGNOSIS — I679 Cerebrovascular disease, unspecified: Secondary | ICD-10-CM | POA: Diagnosis not present

## 2019-12-07 DIAGNOSIS — S72142A Displaced intertrochanteric fracture of left femur, initial encounter for closed fracture: Secondary | ICD-10-CM | POA: Diagnosis not present

## 2019-12-08 DIAGNOSIS — R55 Syncope and collapse: Secondary | ICD-10-CM | POA: Diagnosis not present

## 2019-12-08 DIAGNOSIS — G459 Transient cerebral ischemic attack, unspecified: Secondary | ICD-10-CM | POA: Diagnosis not present

## 2019-12-08 DIAGNOSIS — R69 Illness, unspecified: Secondary | ICD-10-CM | POA: Diagnosis not present

## 2019-12-08 DIAGNOSIS — I679 Cerebrovascular disease, unspecified: Secondary | ICD-10-CM | POA: Diagnosis not present

## 2019-12-08 DIAGNOSIS — M84359A Stress fracture, hip, unspecified, initial encounter for fracture: Secondary | ICD-10-CM | POA: Diagnosis not present

## 2019-12-08 DIAGNOSIS — G9341 Metabolic encephalopathy: Secondary | ICD-10-CM | POA: Diagnosis not present

## 2019-12-09 DIAGNOSIS — D509 Iron deficiency anemia, unspecified: Secondary | ICD-10-CM | POA: Diagnosis not present

## 2019-12-09 DIAGNOSIS — N186 End stage renal disease: Secondary | ICD-10-CM | POA: Diagnosis not present

## 2019-12-09 DIAGNOSIS — D631 Anemia in chronic kidney disease: Secondary | ICD-10-CM | POA: Diagnosis not present

## 2019-12-09 DIAGNOSIS — N2581 Secondary hyperparathyroidism of renal origin: Secondary | ICD-10-CM | POA: Diagnosis not present

## 2019-12-09 DIAGNOSIS — Z992 Dependence on renal dialysis: Secondary | ICD-10-CM | POA: Diagnosis not present

## 2019-12-09 DIAGNOSIS — R112 Nausea with vomiting, unspecified: Secondary | ICD-10-CM | POA: Diagnosis not present

## 2019-12-10 DIAGNOSIS — E119 Type 2 diabetes mellitus without complications: Secondary | ICD-10-CM | POA: Diagnosis not present

## 2019-12-10 DIAGNOSIS — N186 End stage renal disease: Secondary | ICD-10-CM | POA: Diagnosis not present

## 2019-12-10 DIAGNOSIS — I10 Essential (primary) hypertension: Secondary | ICD-10-CM | POA: Diagnosis not present

## 2019-12-10 DIAGNOSIS — Z4789 Encounter for other orthopedic aftercare: Secondary | ICD-10-CM | POA: Diagnosis not present

## 2019-12-11 DIAGNOSIS — N186 End stage renal disease: Secondary | ICD-10-CM | POA: Diagnosis not present

## 2019-12-11 DIAGNOSIS — D509 Iron deficiency anemia, unspecified: Secondary | ICD-10-CM | POA: Diagnosis not present

## 2019-12-11 DIAGNOSIS — Z992 Dependence on renal dialysis: Secondary | ICD-10-CM | POA: Diagnosis not present

## 2019-12-11 DIAGNOSIS — R112 Nausea with vomiting, unspecified: Secondary | ICD-10-CM | POA: Diagnosis not present

## 2019-12-11 DIAGNOSIS — N2581 Secondary hyperparathyroidism of renal origin: Secondary | ICD-10-CM | POA: Diagnosis not present

## 2019-12-11 DIAGNOSIS — D631 Anemia in chronic kidney disease: Secondary | ICD-10-CM | POA: Diagnosis not present

## 2019-12-14 DIAGNOSIS — R109 Unspecified abdominal pain: Secondary | ICD-10-CM | POA: Diagnosis not present

## 2019-12-14 DIAGNOSIS — D631 Anemia in chronic kidney disease: Secondary | ICD-10-CM | POA: Diagnosis not present

## 2019-12-14 DIAGNOSIS — D509 Iron deficiency anemia, unspecified: Secondary | ICD-10-CM | POA: Diagnosis not present

## 2019-12-14 DIAGNOSIS — N2581 Secondary hyperparathyroidism of renal origin: Secondary | ICD-10-CM | POA: Diagnosis not present

## 2019-12-14 DIAGNOSIS — Z992 Dependence on renal dialysis: Secondary | ICD-10-CM | POA: Diagnosis not present

## 2019-12-14 DIAGNOSIS — N186 End stage renal disease: Secondary | ICD-10-CM | POA: Diagnosis not present

## 2019-12-14 DIAGNOSIS — R112 Nausea with vomiting, unspecified: Secondary | ICD-10-CM | POA: Diagnosis not present

## 2019-12-15 ENCOUNTER — Encounter (HOSPITAL_COMMUNITY): Payer: Self-pay | Admitting: Physician Assistant

## 2019-12-15 NOTE — Progress Notes (Signed)
Watchman Referral Patient Data   [name] 69 y.o.   BMI 28.0  Referring Physician: Dr Rayann Heman Referring Contact Information: [facility]   Type of AF: Yes, Paroxysmal. First diagnosis of AF if known: unknown, remote Prior AF ablation? No   CHA2DS2-VASc Score = 5  The patient's score is based upon: CHF History: 1 HTN History: 1 Age : 1 Diabetes History: 1 Stroke History: 0 Vascular Disease History: 1 Gender: 0       HAS-BLED Score HTN      = 1 Abnormal renal/liver    = 1 (Kidney) Stroke     = 0 Bleeding history / disposition  = 1 Labile INR    = 0 Elderly (>65)    = 1 Drugs or EtOH   = 1 (ASA)  HAS-BLED Score = 5  Yearly bleeding risk: 0=1.13, 1=1.02, 2=1.88, 3=3.74, 4=8.70, 5+=12.5    Prior blood thinner history with any adverse event included: Prior GI bleed on Eliquis (stopped 10/2018).  Previous Bleed   Prior Axial Chest Imaging (CT/MRI)? CT angio chest 07/22/19   Prior TEE? No   Most recent TTE 12/15/18 1. The left ventricle has normal systolic function with an ejection  fraction of 60-65%. The cavity size was normal. There is severely  increased left ventricular wall thickness. Left ventricular diastolic  Doppler parameters are consistent with impaired  relaxation.  2. The right ventricle has normal systolic function. The cavity was  normal. There is mildly increased right ventricular wall thickness.  3. Left atrial size was moderately dilated.  4. The aortic valve is tricuspid. Mild thickening of the aortic valve.  Mild calcification of the aortic valve. No stenosis of the aortic valve.  Mild aortic annular calcification noted.  5. The mitral valve is abnormal. Mild thickening of the mitral valve  leaflet. Mild calcification of the mitral valve leaflet. There is mild  mitral annular calcification present. No evidence of mitral valve  stenosis.  6. The aorta is normal in size and structure.  7. The aortic root is normal in  size and structure.  8. Pulmonary hypertension is indeterminate, inadequate TR jet.  9. The inferior vena cava was dilated in size with >50% respiratory  variability.   Patient's most recent Creatinine? Lab Results  Component Value Date   CREATININE 4.72 (H) 11/23/2019     Hx of ESRD on dialysis? Yes    [name] 12/15/19 12:00 PM

## 2019-12-16 DIAGNOSIS — R112 Nausea with vomiting, unspecified: Secondary | ICD-10-CM | POA: Diagnosis not present

## 2019-12-16 DIAGNOSIS — D631 Anemia in chronic kidney disease: Secondary | ICD-10-CM | POA: Diagnosis not present

## 2019-12-16 DIAGNOSIS — N2581 Secondary hyperparathyroidism of renal origin: Secondary | ICD-10-CM | POA: Diagnosis not present

## 2019-12-16 DIAGNOSIS — N186 End stage renal disease: Secondary | ICD-10-CM | POA: Diagnosis not present

## 2019-12-16 DIAGNOSIS — D509 Iron deficiency anemia, unspecified: Secondary | ICD-10-CM | POA: Diagnosis not present

## 2019-12-16 DIAGNOSIS — Z992 Dependence on renal dialysis: Secondary | ICD-10-CM | POA: Diagnosis not present

## 2019-12-17 DIAGNOSIS — I10 Essential (primary) hypertension: Secondary | ICD-10-CM | POA: Diagnosis not present

## 2019-12-17 DIAGNOSIS — I252 Old myocardial infarction: Secondary | ICD-10-CM | POA: Diagnosis not present

## 2019-12-17 DIAGNOSIS — J449 Chronic obstructive pulmonary disease, unspecified: Secondary | ICD-10-CM | POA: Diagnosis not present

## 2019-12-17 DIAGNOSIS — Z992 Dependence on renal dialysis: Secondary | ICD-10-CM | POA: Diagnosis not present

## 2019-12-17 DIAGNOSIS — I120 Hypertensive chronic kidney disease with stage 5 chronic kidney disease or end stage renal disease: Secondary | ICD-10-CM | POA: Diagnosis not present

## 2019-12-17 DIAGNOSIS — N186 End stage renal disease: Secondary | ICD-10-CM | POA: Diagnosis not present

## 2019-12-17 DIAGNOSIS — I495 Sick sinus syndrome: Secondary | ICD-10-CM | POA: Diagnosis not present

## 2019-12-17 DIAGNOSIS — I959 Hypotension, unspecified: Secondary | ICD-10-CM | POA: Diagnosis not present

## 2019-12-17 DIAGNOSIS — R748 Abnormal levels of other serum enzymes: Secondary | ICD-10-CM | POA: Diagnosis not present

## 2019-12-17 DIAGNOSIS — E782 Mixed hyperlipidemia: Secondary | ICD-10-CM | POA: Diagnosis not present

## 2019-12-17 DIAGNOSIS — R0789 Other chest pain: Secondary | ICD-10-CM | POA: Diagnosis not present

## 2019-12-17 DIAGNOSIS — R079 Chest pain, unspecified: Secondary | ICD-10-CM | POA: Diagnosis not present

## 2019-12-17 DIAGNOSIS — I480 Paroxysmal atrial fibrillation: Secondary | ICD-10-CM | POA: Diagnosis not present

## 2019-12-17 DIAGNOSIS — I2510 Atherosclerotic heart disease of native coronary artery without angina pectoris: Secondary | ICD-10-CM | POA: Diagnosis not present

## 2019-12-17 DIAGNOSIS — I482 Chronic atrial fibrillation, unspecified: Secondary | ICD-10-CM | POA: Diagnosis not present

## 2019-12-17 DIAGNOSIS — Z0289 Encounter for other administrative examinations: Secondary | ICD-10-CM | POA: Diagnosis not present

## 2019-12-17 DIAGNOSIS — R42 Dizziness and giddiness: Secondary | ICD-10-CM | POA: Diagnosis not present

## 2019-12-17 DIAGNOSIS — I731 Thromboangiitis obliterans [Buerger's disease]: Secondary | ICD-10-CM | POA: Diagnosis not present

## 2019-12-18 DIAGNOSIS — J449 Chronic obstructive pulmonary disease, unspecified: Secondary | ICD-10-CM | POA: Diagnosis present

## 2019-12-18 DIAGNOSIS — Z992 Dependence on renal dialysis: Secondary | ICD-10-CM | POA: Diagnosis not present

## 2019-12-18 DIAGNOSIS — Z7982 Long term (current) use of aspirin: Secondary | ICD-10-CM | POA: Diagnosis not present

## 2019-12-18 DIAGNOSIS — I482 Chronic atrial fibrillation, unspecified: Secondary | ICD-10-CM | POA: Diagnosis present

## 2019-12-18 DIAGNOSIS — N400 Enlarged prostate without lower urinary tract symptoms: Secondary | ICD-10-CM | POA: Diagnosis present

## 2019-12-18 DIAGNOSIS — I2510 Atherosclerotic heart disease of native coronary artery without angina pectoris: Secondary | ICD-10-CM | POA: Diagnosis present

## 2019-12-18 DIAGNOSIS — R778 Other specified abnormalities of plasma proteins: Secondary | ICD-10-CM | POA: Diagnosis not present

## 2019-12-18 DIAGNOSIS — R748 Abnormal levels of other serum enzymes: Secondary | ICD-10-CM | POA: Diagnosis not present

## 2019-12-18 DIAGNOSIS — I714 Abdominal aortic aneurysm, without rupture: Secondary | ICD-10-CM | POA: Diagnosis present

## 2019-12-18 DIAGNOSIS — I480 Paroxysmal atrial fibrillation: Secondary | ICD-10-CM | POA: Diagnosis not present

## 2019-12-18 DIAGNOSIS — Z950 Presence of cardiac pacemaker: Secondary | ICD-10-CM | POA: Diagnosis not present

## 2019-12-18 DIAGNOSIS — E782 Mixed hyperlipidemia: Secondary | ICD-10-CM | POA: Diagnosis present

## 2019-12-18 DIAGNOSIS — N186 End stage renal disease: Secondary | ICD-10-CM | POA: Diagnosis present

## 2019-12-18 DIAGNOSIS — F419 Anxiety disorder, unspecified: Secondary | ICD-10-CM | POA: Diagnosis present

## 2019-12-18 DIAGNOSIS — Z95818 Presence of other cardiac implants and grafts: Secondary | ICD-10-CM | POA: Diagnosis not present

## 2019-12-18 DIAGNOSIS — I252 Old myocardial infarction: Secondary | ICD-10-CM | POA: Diagnosis not present

## 2019-12-18 DIAGNOSIS — M84459A Pathological fracture, hip, unspecified, initial encounter for fracture: Secondary | ICD-10-CM | POA: Diagnosis not present

## 2019-12-18 DIAGNOSIS — Z20822 Contact with and (suspected) exposure to covid-19: Secondary | ICD-10-CM | POA: Diagnosis present

## 2019-12-18 DIAGNOSIS — I731 Thromboangiitis obliterans [Buerger's disease]: Secondary | ICD-10-CM | POA: Diagnosis present

## 2019-12-18 DIAGNOSIS — I495 Sick sinus syndrome: Secondary | ICD-10-CM | POA: Diagnosis not present

## 2019-12-18 DIAGNOSIS — I10 Essential (primary) hypertension: Secondary | ICD-10-CM | POA: Diagnosis not present

## 2019-12-18 DIAGNOSIS — R079 Chest pain, unspecified: Secondary | ICD-10-CM | POA: Diagnosis not present

## 2019-12-18 DIAGNOSIS — I120 Hypertensive chronic kidney disease with stage 5 chronic kidney disease or end stage renal disease: Secondary | ICD-10-CM | POA: Diagnosis present

## 2019-12-18 DIAGNOSIS — F17210 Nicotine dependence, cigarettes, uncomplicated: Secondary | ICD-10-CM | POA: Diagnosis present

## 2019-12-19 DIAGNOSIS — N186 End stage renal disease: Secondary | ICD-10-CM | POA: Diagnosis not present

## 2019-12-21 DIAGNOSIS — Z23 Encounter for immunization: Secondary | ICD-10-CM | POA: Diagnosis not present

## 2019-12-21 DIAGNOSIS — D509 Iron deficiency anemia, unspecified: Secondary | ICD-10-CM | POA: Diagnosis not present

## 2019-12-21 DIAGNOSIS — M25552 Pain in left hip: Secondary | ICD-10-CM | POA: Diagnosis not present

## 2019-12-21 DIAGNOSIS — N186 End stage renal disease: Secondary | ICD-10-CM | POA: Diagnosis not present

## 2019-12-21 DIAGNOSIS — Z992 Dependence on renal dialysis: Secondary | ICD-10-CM | POA: Diagnosis not present

## 2019-12-21 DIAGNOSIS — N2581 Secondary hyperparathyroidism of renal origin: Secondary | ICD-10-CM | POA: Diagnosis not present

## 2019-12-21 DIAGNOSIS — D631 Anemia in chronic kidney disease: Secondary | ICD-10-CM | POA: Diagnosis not present

## 2019-12-21 DIAGNOSIS — R112 Nausea with vomiting, unspecified: Secondary | ICD-10-CM | POA: Diagnosis not present

## 2019-12-22 DIAGNOSIS — I2510 Atherosclerotic heart disease of native coronary artery without angina pectoris: Secondary | ICD-10-CM | POA: Diagnosis not present

## 2019-12-22 DIAGNOSIS — M25552 Pain in left hip: Secondary | ICD-10-CM | POA: Diagnosis not present

## 2019-12-22 DIAGNOSIS — N390 Urinary tract infection, site not specified: Secondary | ICD-10-CM | POA: Diagnosis not present

## 2019-12-22 DIAGNOSIS — I120 Hypertensive chronic kidney disease with stage 5 chronic kidney disease or end stage renal disease: Secondary | ICD-10-CM | POA: Diagnosis not present

## 2019-12-22 DIAGNOSIS — T43295A Adverse effect of other antidepressants, initial encounter: Secondary | ICD-10-CM | POA: Diagnosis not present

## 2019-12-22 DIAGNOSIS — I10 Essential (primary) hypertension: Secondary | ICD-10-CM | POA: Diagnosis not present

## 2019-12-22 DIAGNOSIS — I252 Old myocardial infarction: Secondary | ICD-10-CM | POA: Diagnosis not present

## 2019-12-22 DIAGNOSIS — N186 End stage renal disease: Secondary | ICD-10-CM | POA: Diagnosis not present

## 2019-12-22 DIAGNOSIS — Z0289 Encounter for other administrative examinations: Secondary | ICD-10-CM | POA: Diagnosis not present

## 2019-12-22 DIAGNOSIS — R441 Visual hallucinations: Secondary | ICD-10-CM | POA: Diagnosis not present

## 2019-12-22 DIAGNOSIS — F22 Delusional disorders: Secondary | ICD-10-CM | POA: Diagnosis not present

## 2019-12-22 DIAGNOSIS — R410 Disorientation, unspecified: Secondary | ICD-10-CM | POA: Diagnosis not present

## 2019-12-22 DIAGNOSIS — E119 Type 2 diabetes mellitus without complications: Secondary | ICD-10-CM | POA: Diagnosis not present

## 2019-12-22 DIAGNOSIS — I4891 Unspecified atrial fibrillation: Secondary | ICD-10-CM | POA: Diagnosis not present

## 2019-12-22 DIAGNOSIS — R440 Auditory hallucinations: Secondary | ICD-10-CM | POA: Diagnosis not present

## 2019-12-22 DIAGNOSIS — B952 Enterococcus as the cause of diseases classified elsewhere: Secondary | ICD-10-CM | POA: Diagnosis not present

## 2019-12-22 DIAGNOSIS — R5381 Other malaise: Secondary | ICD-10-CM | POA: Diagnosis not present

## 2019-12-23 DIAGNOSIS — M5481 Occipital neuralgia: Secondary | ICD-10-CM | POA: Diagnosis not present

## 2019-12-23 DIAGNOSIS — Z992 Dependence on renal dialysis: Secondary | ICD-10-CM | POA: Diagnosis not present

## 2019-12-23 DIAGNOSIS — N186 End stage renal disease: Secondary | ICD-10-CM | POA: Diagnosis not present

## 2019-12-23 DIAGNOSIS — R451 Restlessness and agitation: Secondary | ICD-10-CM | POA: Diagnosis not present

## 2019-12-23 DIAGNOSIS — G9341 Metabolic encephalopathy: Secondary | ICD-10-CM | POA: Diagnosis not present

## 2019-12-23 DIAGNOSIS — R443 Hallucinations, unspecified: Secondary | ICD-10-CM | POA: Diagnosis not present

## 2019-12-23 DIAGNOSIS — F05 Delirium due to known physiological condition: Secondary | ICD-10-CM | POA: Diagnosis not present

## 2019-12-23 DIAGNOSIS — F29 Unspecified psychosis not due to a substance or known physiological condition: Secondary | ICD-10-CM | POA: Diagnosis not present

## 2019-12-24 ENCOUNTER — Ambulatory Visit: Payer: Medicare Other | Admitting: Cardiology

## 2019-12-24 DIAGNOSIS — Z7401 Bed confinement status: Secondary | ICD-10-CM | POA: Diagnosis not present

## 2019-12-24 DIAGNOSIS — R197 Diarrhea, unspecified: Secondary | ICD-10-CM | POA: Diagnosis not present

## 2019-12-24 DIAGNOSIS — F411 Generalized anxiety disorder: Secondary | ICD-10-CM | POA: Diagnosis present

## 2019-12-24 DIAGNOSIS — I2510 Atherosclerotic heart disease of native coronary artery without angina pectoris: Secondary | ICD-10-CM | POA: Diagnosis present

## 2019-12-24 DIAGNOSIS — I480 Paroxysmal atrial fibrillation: Secondary | ICD-10-CM | POA: Diagnosis present

## 2019-12-24 DIAGNOSIS — I120 Hypertensive chronic kidney disease with stage 5 chronic kidney disease or end stage renal disease: Secondary | ICD-10-CM | POA: Diagnosis present

## 2019-12-24 DIAGNOSIS — I509 Heart failure, unspecified: Secondary | ICD-10-CM | POA: Diagnosis not present

## 2019-12-24 DIAGNOSIS — M48061 Spinal stenosis, lumbar region without neurogenic claudication: Secondary | ICD-10-CM | POA: Diagnosis present

## 2019-12-24 DIAGNOSIS — F17210 Nicotine dependence, cigarettes, uncomplicated: Secondary | ICD-10-CM | POA: Diagnosis present

## 2019-12-24 DIAGNOSIS — R451 Restlessness and agitation: Secondary | ICD-10-CM | POA: Diagnosis not present

## 2019-12-24 DIAGNOSIS — J449 Chronic obstructive pulmonary disease, unspecified: Secondary | ICD-10-CM | POA: Diagnosis present

## 2019-12-24 DIAGNOSIS — D649 Anemia, unspecified: Secondary | ICD-10-CM | POA: Diagnosis present

## 2019-12-24 DIAGNOSIS — N390 Urinary tract infection, site not specified: Secondary | ICD-10-CM | POA: Diagnosis present

## 2019-12-24 DIAGNOSIS — F05 Delirium due to known physiological condition: Secondary | ICD-10-CM | POA: Diagnosis not present

## 2019-12-24 DIAGNOSIS — N186 End stage renal disease: Secondary | ICD-10-CM | POA: Diagnosis present

## 2019-12-24 DIAGNOSIS — I10 Essential (primary) hypertension: Secondary | ICD-10-CM | POA: Diagnosis not present

## 2019-12-24 DIAGNOSIS — M5481 Occipital neuralgia: Secondary | ICD-10-CM | POA: Diagnosis not present

## 2019-12-24 DIAGNOSIS — Z992 Dependence on renal dialysis: Secondary | ICD-10-CM | POA: Diagnosis not present

## 2019-12-24 DIAGNOSIS — I731 Thromboangiitis obliterans [Buerger's disease]: Secondary | ICD-10-CM | POA: Diagnosis present

## 2019-12-24 DIAGNOSIS — M5416 Radiculopathy, lumbar region: Secondary | ICD-10-CM | POA: Diagnosis not present

## 2019-12-24 DIAGNOSIS — Z781 Physical restraint status: Secondary | ICD-10-CM | POA: Diagnosis not present

## 2019-12-24 DIAGNOSIS — G9341 Metabolic encephalopathy: Secondary | ICD-10-CM | POA: Diagnosis not present

## 2019-12-24 DIAGNOSIS — R443 Hallucinations, unspecified: Secondary | ICD-10-CM | POA: Diagnosis not present

## 2019-12-24 DIAGNOSIS — R0689 Other abnormalities of breathing: Secondary | ICD-10-CM | POA: Diagnosis not present

## 2019-12-24 DIAGNOSIS — F29 Unspecified psychosis not due to a substance or known physiological condition: Secondary | ICD-10-CM | POA: Diagnosis not present

## 2019-12-24 DIAGNOSIS — I495 Sick sinus syndrome: Secondary | ICD-10-CM | POA: Diagnosis present

## 2019-12-24 DIAGNOSIS — T43295A Adverse effect of other antidepressants, initial encounter: Secondary | ICD-10-CM | POA: Diagnosis present

## 2019-12-24 DIAGNOSIS — R440 Auditory hallucinations: Secondary | ICD-10-CM | POA: Diagnosis present

## 2019-12-24 DIAGNOSIS — K219 Gastro-esophageal reflux disease without esophagitis: Secondary | ICD-10-CM | POA: Diagnosis present

## 2019-12-24 DIAGNOSIS — I252 Old myocardial infarction: Secondary | ICD-10-CM | POA: Diagnosis not present

## 2019-12-24 DIAGNOSIS — R4182 Altered mental status, unspecified: Secondary | ICD-10-CM | POA: Diagnosis not present

## 2019-12-24 DIAGNOSIS — Z85068 Personal history of other malignant neoplasm of small intestine: Secondary | ICD-10-CM | POA: Diagnosis not present

## 2019-12-24 DIAGNOSIS — E785 Hyperlipidemia, unspecified: Secondary | ICD-10-CM | POA: Diagnosis present

## 2019-12-24 DIAGNOSIS — B952 Enterococcus as the cause of diseases classified elsewhere: Secondary | ICD-10-CM | POA: Diagnosis present

## 2019-12-24 DIAGNOSIS — Z20822 Contact with and (suspected) exposure to covid-19: Secondary | ICD-10-CM | POA: Diagnosis present

## 2019-12-24 DIAGNOSIS — R441 Visual hallucinations: Secondary | ICD-10-CM | POA: Diagnosis present

## 2019-12-24 DIAGNOSIS — N400 Enlarged prostate without lower urinary tract symptoms: Secondary | ICD-10-CM | POA: Diagnosis present

## 2019-12-29 DIAGNOSIS — R410 Disorientation, unspecified: Secondary | ICD-10-CM | POA: Diagnosis not present

## 2019-12-29 DIAGNOSIS — N390 Urinary tract infection, site not specified: Secondary | ICD-10-CM | POA: Diagnosis not present

## 2019-12-29 DIAGNOSIS — N186 End stage renal disease: Secondary | ICD-10-CM | POA: Diagnosis not present

## 2019-12-29 DIAGNOSIS — R5381 Other malaise: Secondary | ICD-10-CM | POA: Diagnosis not present

## 2019-12-29 DIAGNOSIS — E119 Type 2 diabetes mellitus without complications: Secondary | ICD-10-CM | POA: Diagnosis not present

## 2019-12-29 DIAGNOSIS — I4891 Unspecified atrial fibrillation: Secondary | ICD-10-CM | POA: Diagnosis not present

## 2019-12-30 DIAGNOSIS — Z23 Encounter for immunization: Secondary | ICD-10-CM | POA: Diagnosis not present

## 2019-12-30 DIAGNOSIS — D631 Anemia in chronic kidney disease: Secondary | ICD-10-CM | POA: Diagnosis not present

## 2019-12-30 DIAGNOSIS — N186 End stage renal disease: Secondary | ICD-10-CM | POA: Diagnosis not present

## 2019-12-30 DIAGNOSIS — R112 Nausea with vomiting, unspecified: Secondary | ICD-10-CM | POA: Diagnosis not present

## 2019-12-30 DIAGNOSIS — N2581 Secondary hyperparathyroidism of renal origin: Secondary | ICD-10-CM | POA: Diagnosis not present

## 2019-12-30 DIAGNOSIS — D509 Iron deficiency anemia, unspecified: Secondary | ICD-10-CM | POA: Diagnosis not present

## 2020-01-01 DIAGNOSIS — N2581 Secondary hyperparathyroidism of renal origin: Secondary | ICD-10-CM | POA: Diagnosis not present

## 2020-01-01 DIAGNOSIS — R112 Nausea with vomiting, unspecified: Secondary | ICD-10-CM | POA: Diagnosis not present

## 2020-01-01 DIAGNOSIS — D631 Anemia in chronic kidney disease: Secondary | ICD-10-CM | POA: Diagnosis not present

## 2020-01-01 DIAGNOSIS — Z23 Encounter for immunization: Secondary | ICD-10-CM | POA: Diagnosis not present

## 2020-01-01 DIAGNOSIS — D509 Iron deficiency anemia, unspecified: Secondary | ICD-10-CM | POA: Diagnosis not present

## 2020-01-01 DIAGNOSIS — N186 End stage renal disease: Secondary | ICD-10-CM | POA: Diagnosis not present

## 2020-01-04 DIAGNOSIS — R112 Nausea with vomiting, unspecified: Secondary | ICD-10-CM | POA: Diagnosis not present

## 2020-01-04 DIAGNOSIS — N186 End stage renal disease: Secondary | ICD-10-CM | POA: Diagnosis not present

## 2020-01-04 DIAGNOSIS — Z23 Encounter for immunization: Secondary | ICD-10-CM | POA: Diagnosis not present

## 2020-01-04 DIAGNOSIS — M5136 Other intervertebral disc degeneration, lumbar region: Secondary | ICD-10-CM | POA: Diagnosis not present

## 2020-01-04 DIAGNOSIS — M48061 Spinal stenosis, lumbar region without neurogenic claudication: Secondary | ICD-10-CM | POA: Diagnosis not present

## 2020-01-04 DIAGNOSIS — G8929 Other chronic pain: Secondary | ICD-10-CM | POA: Diagnosis not present

## 2020-01-04 DIAGNOSIS — M542 Cervicalgia: Secondary | ICD-10-CM | POA: Diagnosis not present

## 2020-01-04 DIAGNOSIS — D509 Iron deficiency anemia, unspecified: Secondary | ICD-10-CM | POA: Diagnosis not present

## 2020-01-04 DIAGNOSIS — M5416 Radiculopathy, lumbar region: Secondary | ICD-10-CM | POA: Diagnosis not present

## 2020-01-04 DIAGNOSIS — D631 Anemia in chronic kidney disease: Secondary | ICD-10-CM | POA: Diagnosis not present

## 2020-01-04 DIAGNOSIS — N2581 Secondary hyperparathyroidism of renal origin: Secondary | ICD-10-CM | POA: Diagnosis not present

## 2020-01-05 DIAGNOSIS — M5416 Radiculopathy, lumbar region: Secondary | ICD-10-CM | POA: Diagnosis not present

## 2020-01-06 DIAGNOSIS — N186 End stage renal disease: Secondary | ICD-10-CM | POA: Diagnosis not present

## 2020-01-06 DIAGNOSIS — D631 Anemia in chronic kidney disease: Secondary | ICD-10-CM | POA: Diagnosis not present

## 2020-01-06 DIAGNOSIS — R112 Nausea with vomiting, unspecified: Secondary | ICD-10-CM | POA: Diagnosis not present

## 2020-01-06 DIAGNOSIS — D509 Iron deficiency anemia, unspecified: Secondary | ICD-10-CM | POA: Diagnosis not present

## 2020-01-06 DIAGNOSIS — N2581 Secondary hyperparathyroidism of renal origin: Secondary | ICD-10-CM | POA: Diagnosis not present

## 2020-01-06 DIAGNOSIS — Z23 Encounter for immunization: Secondary | ICD-10-CM | POA: Diagnosis not present

## 2020-01-08 DIAGNOSIS — Z23 Encounter for immunization: Secondary | ICD-10-CM | POA: Diagnosis not present

## 2020-01-08 DIAGNOSIS — D631 Anemia in chronic kidney disease: Secondary | ICD-10-CM | POA: Diagnosis not present

## 2020-01-08 DIAGNOSIS — N186 End stage renal disease: Secondary | ICD-10-CM | POA: Diagnosis not present

## 2020-01-08 DIAGNOSIS — N2581 Secondary hyperparathyroidism of renal origin: Secondary | ICD-10-CM | POA: Diagnosis not present

## 2020-01-08 DIAGNOSIS — D509 Iron deficiency anemia, unspecified: Secondary | ICD-10-CM | POA: Diagnosis not present

## 2020-01-08 DIAGNOSIS — R112 Nausea with vomiting, unspecified: Secondary | ICD-10-CM | POA: Diagnosis not present

## 2020-01-10 DIAGNOSIS — I4891 Unspecified atrial fibrillation: Secondary | ICD-10-CM | POA: Diagnosis not present

## 2020-01-10 DIAGNOSIS — N186 End stage renal disease: Secondary | ICD-10-CM | POA: Diagnosis not present

## 2020-01-10 DIAGNOSIS — N390 Urinary tract infection, site not specified: Secondary | ICD-10-CM | POA: Diagnosis not present

## 2020-01-10 DIAGNOSIS — R410 Disorientation, unspecified: Secondary | ICD-10-CM | POA: Diagnosis not present

## 2020-01-10 DIAGNOSIS — E119 Type 2 diabetes mellitus without complications: Secondary | ICD-10-CM | POA: Diagnosis not present

## 2020-01-10 DIAGNOSIS — R5381 Other malaise: Secondary | ICD-10-CM | POA: Diagnosis not present

## 2020-01-11 DIAGNOSIS — R109 Unspecified abdominal pain: Secondary | ICD-10-CM | POA: Diagnosis not present

## 2020-01-11 DIAGNOSIS — N186 End stage renal disease: Secondary | ICD-10-CM | POA: Diagnosis not present

## 2020-01-11 DIAGNOSIS — Z23 Encounter for immunization: Secondary | ICD-10-CM | POA: Diagnosis not present

## 2020-01-11 DIAGNOSIS — R112 Nausea with vomiting, unspecified: Secondary | ICD-10-CM | POA: Diagnosis not present

## 2020-01-11 DIAGNOSIS — N2581 Secondary hyperparathyroidism of renal origin: Secondary | ICD-10-CM | POA: Diagnosis not present

## 2020-01-11 DIAGNOSIS — M5136 Other intervertebral disc degeneration, lumbar region: Secondary | ICD-10-CM | POA: Diagnosis not present

## 2020-01-11 DIAGNOSIS — G8929 Other chronic pain: Secondary | ICD-10-CM | POA: Diagnosis not present

## 2020-01-11 DIAGNOSIS — M5416 Radiculopathy, lumbar region: Secondary | ICD-10-CM | POA: Diagnosis not present

## 2020-01-11 DIAGNOSIS — M48061 Spinal stenosis, lumbar region without neurogenic claudication: Secondary | ICD-10-CM | POA: Diagnosis not present

## 2020-01-11 DIAGNOSIS — D631 Anemia in chronic kidney disease: Secondary | ICD-10-CM | POA: Diagnosis not present

## 2020-01-11 DIAGNOSIS — D509 Iron deficiency anemia, unspecified: Secondary | ICD-10-CM | POA: Diagnosis not present

## 2020-01-11 DIAGNOSIS — E119 Type 2 diabetes mellitus without complications: Secondary | ICD-10-CM | POA: Diagnosis not present

## 2020-01-13 DIAGNOSIS — R112 Nausea with vomiting, unspecified: Secondary | ICD-10-CM | POA: Diagnosis not present

## 2020-01-13 DIAGNOSIS — N2581 Secondary hyperparathyroidism of renal origin: Secondary | ICD-10-CM | POA: Diagnosis not present

## 2020-01-13 DIAGNOSIS — D509 Iron deficiency anemia, unspecified: Secondary | ICD-10-CM | POA: Diagnosis not present

## 2020-01-13 DIAGNOSIS — D631 Anemia in chronic kidney disease: Secondary | ICD-10-CM | POA: Diagnosis not present

## 2020-01-13 DIAGNOSIS — Z23 Encounter for immunization: Secondary | ICD-10-CM | POA: Diagnosis not present

## 2020-01-13 DIAGNOSIS — N186 End stage renal disease: Secondary | ICD-10-CM | POA: Diagnosis not present

## 2020-01-14 ENCOUNTER — Ambulatory Visit: Payer: Medicare Other | Admitting: Family Medicine

## 2020-01-14 DIAGNOSIS — F4323 Adjustment disorder with mixed anxiety and depressed mood: Secondary | ICD-10-CM | POA: Diagnosis not present

## 2020-01-15 DIAGNOSIS — R112 Nausea with vomiting, unspecified: Secondary | ICD-10-CM | POA: Diagnosis not present

## 2020-01-15 DIAGNOSIS — N186 End stage renal disease: Secondary | ICD-10-CM | POA: Diagnosis not present

## 2020-01-15 DIAGNOSIS — D509 Iron deficiency anemia, unspecified: Secondary | ICD-10-CM | POA: Diagnosis not present

## 2020-01-15 DIAGNOSIS — N2581 Secondary hyperparathyroidism of renal origin: Secondary | ICD-10-CM | POA: Diagnosis not present

## 2020-01-15 DIAGNOSIS — D631 Anemia in chronic kidney disease: Secondary | ICD-10-CM | POA: Diagnosis not present

## 2020-01-15 DIAGNOSIS — Z23 Encounter for immunization: Secondary | ICD-10-CM | POA: Diagnosis not present

## 2020-01-17 DIAGNOSIS — M5416 Radiculopathy, lumbar region: Secondary | ICD-10-CM | POA: Diagnosis not present

## 2020-01-17 DIAGNOSIS — Z01818 Encounter for other preprocedural examination: Secondary | ICD-10-CM | POA: Diagnosis not present

## 2020-01-18 DIAGNOSIS — R112 Nausea with vomiting, unspecified: Secondary | ICD-10-CM | POA: Diagnosis not present

## 2020-01-18 DIAGNOSIS — D509 Iron deficiency anemia, unspecified: Secondary | ICD-10-CM | POA: Diagnosis not present

## 2020-01-18 DIAGNOSIS — D631 Anemia in chronic kidney disease: Secondary | ICD-10-CM | POA: Diagnosis not present

## 2020-01-18 DIAGNOSIS — Z23 Encounter for immunization: Secondary | ICD-10-CM | POA: Diagnosis not present

## 2020-01-18 DIAGNOSIS — N2581 Secondary hyperparathyroidism of renal origin: Secondary | ICD-10-CM | POA: Diagnosis not present

## 2020-01-18 DIAGNOSIS — N186 End stage renal disease: Secondary | ICD-10-CM | POA: Diagnosis not present

## 2020-01-19 DIAGNOSIS — F321 Major depressive disorder, single episode, moderate: Secondary | ICD-10-CM | POA: Diagnosis not present

## 2020-01-20 DIAGNOSIS — N186 End stage renal disease: Secondary | ICD-10-CM | POA: Diagnosis not present

## 2020-01-20 DIAGNOSIS — R112 Nausea with vomiting, unspecified: Secondary | ICD-10-CM | POA: Diagnosis not present

## 2020-01-20 DIAGNOSIS — Z992 Dependence on renal dialysis: Secondary | ICD-10-CM | POA: Diagnosis not present

## 2020-01-20 DIAGNOSIS — D509 Iron deficiency anemia, unspecified: Secondary | ICD-10-CM | POA: Diagnosis not present

## 2020-01-22 DIAGNOSIS — N186 End stage renal disease: Secondary | ICD-10-CM | POA: Diagnosis not present

## 2020-01-22 DIAGNOSIS — D509 Iron deficiency anemia, unspecified: Secondary | ICD-10-CM | POA: Diagnosis not present

## 2020-01-22 DIAGNOSIS — R112 Nausea with vomiting, unspecified: Secondary | ICD-10-CM | POA: Diagnosis not present

## 2020-01-22 DIAGNOSIS — Z992 Dependence on renal dialysis: Secondary | ICD-10-CM | POA: Diagnosis not present

## 2020-01-25 DIAGNOSIS — D509 Iron deficiency anemia, unspecified: Secondary | ICD-10-CM | POA: Diagnosis not present

## 2020-01-25 DIAGNOSIS — Z992 Dependence on renal dialysis: Secondary | ICD-10-CM | POA: Diagnosis not present

## 2020-01-25 DIAGNOSIS — N186 End stage renal disease: Secondary | ICD-10-CM | POA: Diagnosis not present

## 2020-01-25 DIAGNOSIS — R112 Nausea with vomiting, unspecified: Secondary | ICD-10-CM | POA: Diagnosis not present

## 2020-01-27 DIAGNOSIS — Z992 Dependence on renal dialysis: Secondary | ICD-10-CM | POA: Diagnosis not present

## 2020-01-27 DIAGNOSIS — N186 End stage renal disease: Secondary | ICD-10-CM | POA: Diagnosis not present

## 2020-01-27 DIAGNOSIS — D509 Iron deficiency anemia, unspecified: Secondary | ICD-10-CM | POA: Diagnosis not present

## 2020-01-27 DIAGNOSIS — R112 Nausea with vomiting, unspecified: Secondary | ICD-10-CM | POA: Diagnosis not present

## 2020-01-28 DIAGNOSIS — R5381 Other malaise: Secondary | ICD-10-CM | POA: Diagnosis not present

## 2020-01-28 DIAGNOSIS — M542 Cervicalgia: Secondary | ICD-10-CM | POA: Diagnosis not present

## 2020-01-28 DIAGNOSIS — N186 End stage renal disease: Secondary | ICD-10-CM | POA: Diagnosis not present

## 2020-01-28 DIAGNOSIS — I4891 Unspecified atrial fibrillation: Secondary | ICD-10-CM | POA: Diagnosis not present

## 2020-01-28 DIAGNOSIS — N390 Urinary tract infection, site not specified: Secondary | ICD-10-CM | POA: Diagnosis not present

## 2020-01-28 DIAGNOSIS — E119 Type 2 diabetes mellitus without complications: Secondary | ICD-10-CM | POA: Diagnosis not present

## 2020-01-28 DIAGNOSIS — R410 Disorientation, unspecified: Secondary | ICD-10-CM | POA: Diagnosis not present

## 2020-01-29 DIAGNOSIS — D509 Iron deficiency anemia, unspecified: Secondary | ICD-10-CM | POA: Diagnosis not present

## 2020-01-29 DIAGNOSIS — R112 Nausea with vomiting, unspecified: Secondary | ICD-10-CM | POA: Diagnosis not present

## 2020-01-29 DIAGNOSIS — N186 End stage renal disease: Secondary | ICD-10-CM | POA: Diagnosis not present

## 2020-01-29 DIAGNOSIS — Z992 Dependence on renal dialysis: Secondary | ICD-10-CM | POA: Diagnosis not present

## 2020-01-31 DIAGNOSIS — S72145D Nondisplaced intertrochanteric fracture of left femur, subsequent encounter for closed fracture with routine healing: Secondary | ICD-10-CM | POA: Diagnosis not present

## 2020-01-31 DIAGNOSIS — E1122 Type 2 diabetes mellitus with diabetic chronic kidney disease: Secondary | ICD-10-CM | POA: Diagnosis not present

## 2020-01-31 DIAGNOSIS — E1142 Type 2 diabetes mellitus with diabetic polyneuropathy: Secondary | ICD-10-CM | POA: Diagnosis not present

## 2020-01-31 DIAGNOSIS — L851 Acquired keratosis [keratoderma] palmaris et plantaris: Secondary | ICD-10-CM | POA: Diagnosis not present

## 2020-02-01 DIAGNOSIS — M48061 Spinal stenosis, lumbar region without neurogenic claudication: Secondary | ICD-10-CM | POA: Diagnosis not present

## 2020-02-01 DIAGNOSIS — Z992 Dependence on renal dialysis: Secondary | ICD-10-CM | POA: Diagnosis not present

## 2020-02-01 DIAGNOSIS — G8929 Other chronic pain: Secondary | ICD-10-CM | POA: Diagnosis not present

## 2020-02-01 DIAGNOSIS — M5136 Other intervertebral disc degeneration, lumbar region: Secondary | ICD-10-CM | POA: Diagnosis not present

## 2020-02-01 DIAGNOSIS — M5416 Radiculopathy, lumbar region: Secondary | ICD-10-CM | POA: Diagnosis not present

## 2020-02-01 DIAGNOSIS — N186 End stage renal disease: Secondary | ICD-10-CM | POA: Diagnosis not present

## 2020-02-01 DIAGNOSIS — D509 Iron deficiency anemia, unspecified: Secondary | ICD-10-CM | POA: Diagnosis not present

## 2020-02-01 DIAGNOSIS — R112 Nausea with vomiting, unspecified: Secondary | ICD-10-CM | POA: Diagnosis not present

## 2020-02-02 DIAGNOSIS — I10 Essential (primary) hypertension: Secondary | ICD-10-CM | POA: Diagnosis not present

## 2020-02-03 DIAGNOSIS — D509 Iron deficiency anemia, unspecified: Secondary | ICD-10-CM | POA: Diagnosis not present

## 2020-02-03 DIAGNOSIS — R112 Nausea with vomiting, unspecified: Secondary | ICD-10-CM | POA: Diagnosis not present

## 2020-02-03 DIAGNOSIS — Z992 Dependence on renal dialysis: Secondary | ICD-10-CM | POA: Diagnosis not present

## 2020-02-03 DIAGNOSIS — N186 End stage renal disease: Secondary | ICD-10-CM | POA: Diagnosis not present

## 2020-02-05 DIAGNOSIS — Z992 Dependence on renal dialysis: Secondary | ICD-10-CM | POA: Diagnosis not present

## 2020-02-05 DIAGNOSIS — R112 Nausea with vomiting, unspecified: Secondary | ICD-10-CM | POA: Diagnosis not present

## 2020-02-05 DIAGNOSIS — N186 End stage renal disease: Secondary | ICD-10-CM | POA: Diagnosis not present

## 2020-02-05 DIAGNOSIS — D509 Iron deficiency anemia, unspecified: Secondary | ICD-10-CM | POA: Diagnosis not present

## 2020-02-07 DIAGNOSIS — F321 Major depressive disorder, single episode, moderate: Secondary | ICD-10-CM | POA: Diagnosis not present

## 2020-02-07 DIAGNOSIS — D509 Iron deficiency anemia, unspecified: Secondary | ICD-10-CM | POA: Diagnosis not present

## 2020-02-07 DIAGNOSIS — R112 Nausea with vomiting, unspecified: Secondary | ICD-10-CM | POA: Diagnosis not present

## 2020-02-07 DIAGNOSIS — Z992 Dependence on renal dialysis: Secondary | ICD-10-CM | POA: Diagnosis not present

## 2020-02-07 DIAGNOSIS — N186 End stage renal disease: Secondary | ICD-10-CM | POA: Diagnosis not present

## 2020-02-08 ENCOUNTER — Ambulatory Visit: Payer: Medicare Other | Admitting: Cardiology

## 2020-02-08 DIAGNOSIS — M48062 Spinal stenosis, lumbar region with neurogenic claudication: Secondary | ICD-10-CM | POA: Diagnosis not present

## 2020-02-09 ENCOUNTER — Other Ambulatory Visit: Payer: Self-pay | Admitting: Student

## 2020-02-09 DIAGNOSIS — M48062 Spinal stenosis, lumbar region with neurogenic claudication: Secondary | ICD-10-CM

## 2020-02-10 ENCOUNTER — Telehealth: Payer: Self-pay | Admitting: Nurse Practitioner

## 2020-02-10 DIAGNOSIS — R112 Nausea with vomiting, unspecified: Secondary | ICD-10-CM | POA: Diagnosis not present

## 2020-02-10 DIAGNOSIS — D509 Iron deficiency anemia, unspecified: Secondary | ICD-10-CM | POA: Diagnosis not present

## 2020-02-10 DIAGNOSIS — Z992 Dependence on renal dialysis: Secondary | ICD-10-CM | POA: Diagnosis not present

## 2020-02-10 DIAGNOSIS — N186 End stage renal disease: Secondary | ICD-10-CM | POA: Diagnosis not present

## 2020-02-10 NOTE — Telephone Encounter (Signed)
Phone call to patient to verify medication list and allergies for myelogram procedure. Pt instructed to hold wellbutrin, risperidone and tramadol for 48hrs prior to myelogram appointment time. Pt verbalized understanding. Pre and post procedure instructions reviewed with pt.

## 2020-02-12 DIAGNOSIS — R112 Nausea with vomiting, unspecified: Secondary | ICD-10-CM | POA: Diagnosis not present

## 2020-02-12 DIAGNOSIS — Z992 Dependence on renal dialysis: Secondary | ICD-10-CM | POA: Diagnosis not present

## 2020-02-12 DIAGNOSIS — D509 Iron deficiency anemia, unspecified: Secondary | ICD-10-CM | POA: Diagnosis not present

## 2020-02-12 DIAGNOSIS — N186 End stage renal disease: Secondary | ICD-10-CM | POA: Diagnosis not present

## 2020-02-14 ENCOUNTER — Ambulatory Visit: Payer: Medicare Other | Admitting: Cardiology

## 2020-02-14 DIAGNOSIS — F321 Major depressive disorder, single episode, moderate: Secondary | ICD-10-CM | POA: Diagnosis not present

## 2020-02-15 DIAGNOSIS — R112 Nausea with vomiting, unspecified: Secondary | ICD-10-CM | POA: Diagnosis not present

## 2020-02-15 DIAGNOSIS — R109 Unspecified abdominal pain: Secondary | ICD-10-CM | POA: Diagnosis not present

## 2020-02-15 DIAGNOSIS — N186 End stage renal disease: Secondary | ICD-10-CM | POA: Diagnosis not present

## 2020-02-15 DIAGNOSIS — D509 Iron deficiency anemia, unspecified: Secondary | ICD-10-CM | POA: Diagnosis not present

## 2020-02-15 DIAGNOSIS — Z992 Dependence on renal dialysis: Secondary | ICD-10-CM | POA: Diagnosis not present

## 2020-02-16 ENCOUNTER — Encounter: Payer: Self-pay | Admitting: Urology

## 2020-02-16 ENCOUNTER — Ambulatory Visit: Payer: Medicare Other | Admitting: Urology

## 2020-02-16 ENCOUNTER — Other Ambulatory Visit: Payer: Self-pay

## 2020-02-16 ENCOUNTER — Ambulatory Visit (INDEPENDENT_AMBULATORY_CARE_PROVIDER_SITE_OTHER): Payer: Medicare Other | Admitting: Urology

## 2020-02-16 DIAGNOSIS — I2510 Atherosclerotic heart disease of native coronary artery without angina pectoris: Secondary | ICD-10-CM | POA: Diagnosis not present

## 2020-02-16 DIAGNOSIS — R39198 Other difficulties with micturition: Secondary | ICD-10-CM | POA: Diagnosis not present

## 2020-02-16 DIAGNOSIS — N2889 Other specified disorders of kidney and ureter: Secondary | ICD-10-CM | POA: Diagnosis not present

## 2020-02-16 LAB — MICROSCOPIC EXAMINATION
Bacteria, UA: NONE SEEN
Epithelial Cells (non renal): NONE SEEN /hpf (ref 0–10)
RBC, Urine: NONE SEEN /hpf (ref 0–2)
Renal Epithel, UA: NONE SEEN /hpf
WBC, UA: NONE SEEN /hpf (ref 0–5)

## 2020-02-16 LAB — URINALYSIS, ROUTINE W REFLEX MICROSCOPIC
Bilirubin, UA: NEGATIVE
Glucose, UA: NEGATIVE
Leukocytes,UA: NEGATIVE
Nitrite, UA: NEGATIVE
RBC, UA: NEGATIVE
Specific Gravity, UA: 1.015 (ref 1.005–1.030)
Urobilinogen, Ur: 0.2 mg/dL (ref 0.2–1.0)
pH, UA: 5 (ref 5.0–7.5)

## 2020-02-16 LAB — BLADDER SCAN AMB NON-IMAGING: Scan Result: 70

## 2020-02-16 MED ORDER — MIRABEGRON ER 25 MG PO TB24: 25.0000 mg | ORAL_TABLET | Freq: Every day | ORAL | 0 refills | Status: DC

## 2020-02-16 MED ORDER — TAMSULOSIN HCL 0.4 MG PO CAPS: 0.4000 mg | ORAL_CAPSULE | Freq: Every day | ORAL | 3 refills | Status: DC

## 2020-02-16 NOTE — Progress Notes (Signed)
Pt is prepped for and in and out catherization. Patient was cleaned and prepped in a sterle fashion with betadine. A 14 fr catheter foley was inserted. Urine return was note 30 ml.  Performed by [facility], LPN   Urine sent for UA and Culture

## 2020-02-16 NOTE — Patient Instructions (Signed)

## 2020-02-16 NOTE — Progress Notes (Signed)
02/16/2020 12:09 PM   [name] Feb 06, 1951 [id number]  Referring provider: Manon Hilding, MD [address],  [place] [address]  Urinary urgency  HPI: Mr Antony is a 65FH here for followup of a right Bosniak cyst and new issues with urinary urgency. In JUne he had an L2 compression fracture and underwent repair. He then fell and had a hip fracture in 11/2019. He is currently in a facility. He is now confined to a stretcher. Lumbar MRI Showed a stable 3.6cm. Since his L2 fracture he has urinary urgency, frequency, and dysuria. He was treated for a UTI in Aug 2021 with amoxicillin. He is also incontinent of bowel.    PMH: Past Medical History:  Diagnosis Date  . AAA (abdominal aortic aneurysm) ([place]) 06/2016   Mild increase in size of 3.4 cm infrarenal abdominal aortic aneurysm  . Anemia   . Aortic atherosclerosis ([place])   . Asthma   . Atrial flutter ([place])   . AVF (arteriovenous fistula) ([facility])    Left forearm  . CAD (coronary artery disease)    a. s/p prior stenting of RCA in 1999 b. cath in 2003 showing moderate CAD c. NST in 2016 showing small area of ischemic and low-risk  . CHF (congestive heart failure) ([place])   . CKD (chronic kidney disease), stage V ([place])    kidney transplant evaluation  . COPD (chronic obstructive pulmonary disease) ([place])    with ongoing tobacco use and patient failed sham takes  . Diverticulosis 2018   Mild sigmoid colon diverticulosis.  Marland Kitchen DM2 (diabetes mellitus, type 2) ([place])   . Dyslipidemia   . Dysrhythmia 2018   atrial fibrillation  . Edema    chronic lower extremity secondary to right heart failure and chronic venous insufficiency  . Fatigue   . Fatty liver 2010   Mild  . Headache   . HTN (hypertension)   . Morbid obesity ([place])   . Multiple pulmonary nodules 05/2018   Bilateral  . Pneumonia   . Presence of permanent cardiac pacemaker   . 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    Marland Kitchen Reflux esophagitis    hx  . Sleep apnea    PT STATES ONE TEST WAS POSITIVE AND ANOTHER TEST WAS NEGATIVE  . Two-vessel coronary artery disease    moderate. by cath in 2003. Status post stenting of the mdi RCA November 1999 normal left ventricular ejection fraction  . Wears glasses   . Wears hearing aid    B/L    Surgical History: Past Surgical History:  Procedure Laterality Date  . A/V FISTULAGRAM Left 04/30/2019   Procedure: A/V FISTULAGRAM;  Surgeon: Elam Dutch, MD;  Location: [facility];  Service: Cardiovascular;  Laterality: Left;  . A/V FISTULAGRAM Left 09/22/2019   Procedure: A/V FISTULAGRAM;  Surgeon: Marty Heck, MD;  Location: [facility];  Service: Cardiovascular;  Laterality: Left;  . ABDOMINAL SURGERY    . APPENDECTOMY    . AV FISTULA PLACEMENT Right 03/11/2018   Procedure: CREATION Brachiocephalic Fistula RIGHT ARM;  Surgeon: Rosetta Posner, MD;  Location[facility];  Service: Vascular;  Laterality: Right;  . AV FISTULA PLACEMENT Left 05/06/2019   Procedure: LEFT BRACHIOCEPHALIC ARTERIOVENOUS (AV) FISTULA CREATION;  Surgeon: Marty Heck, MD;  Location: [facility];  Service: Vascular;  Laterality: Left;  . BACK SURGERY     x3; cages in patient's back since  2000  . BIOPSY  11/12/2018   Procedure: BIOPSY;  Surgeon: Laurence Spates, MD;  Location: [facility];  Service: Endoscopy;;  . CARDIAC CATHETERIZATION     stent  . CHOLECYSTECTOMY    . CLOSED REDUCTION SHOULDER DISLOCATION    . COLONOSCOPY W/ BIOPSIES AND POLYPECTOMY    . COLONOSCOPY WITH PROPOFOL N/A 11/12/2018   Procedure: COLONOSCOPY WITH PROPOFOL;  Surgeon: Laurence Spates, MD;  Location: [facility];  Service: Endoscopy;  Laterality: N/A;  . CORONARY ANGIOPLASTY    . diabetic ulcers    . DIAGNOSTIC LAPAROSCOPY    . DIALYSIS/PERMA CATHETER REMOVAL N/A 09/22/2019   Procedure: DIALYSIS/PERMA CATHETER REMOVAL;  Surgeon: Marty Heck, MD;  Location: [facility];   Service: Cardiovascular;  Laterality: N/A;  . ESOPHAGOGASTRODUODENOSCOPY (EGD) WITH PROPOFOL N/A 11/12/2018   Procedure: ESOPHAGOGASTRODUODENOSCOPY (EGD) WITH PROPOFOL;  Surgeon: Laurence Spates, MD;  [facility]: [facility];  Service: Endoscopy;  Laterality: N/A;  . GROIN EXPLORATION    . HEMOSTASIS CLIP PLACEMENT  11/12/2018   Procedure: HEMOSTASIS CLIP PLACEMENT;  Surgeon: Laurence Spates, MD;  Location: [facility];  Service: Endoscopy;;  . INSERT / REPLACE / REMOVE PACEMAKER    . INSERTION OF ARTERIOVENOUS (AV) ARTEGRAFT ARM Left 03/18/2018   Procedure: INSERTION OF ARTERIOVENOUS (AV) FISTULA;  Surgeon: Rosetta Posner, MD;  Location: [facility];  Service: Vascular;  Laterality: Left;  . IR FLUORO GUIDE CV LINE RIGHT  04/11/2019  . IR US GUIDE VASC ACCESS RIGHT  04/11/2019  . LEFT HEART CATH AND CORONARY ANGIOGRAPHY N/A 07/23/2019   Procedure: LEFT HEART CATH AND CORONARY ANGIOGRAPHY;  Surgeon: Martinique, Peter M, MD;  Location: [facility];  Service: Cardiovascular;  Laterality: N/A;  . LIGATION ARTERIOVENOUS GORTEX GRAFT Right 03/18/2018   Procedure: LIGATION ARTERIOVENOUS FISTULA;  Surgeon: Rosetta Posner, MD;  Location: [facility];  Service: Vascular;  Laterality: Right;  . OSTECTOMY Left 04/23/2017   Procedure: OSTECTOMY LEFT GREAT TOE;  Surgeon: [name], DPM;  Location: [facility];  Service: Podiatry;  Laterality: Left;  left great toe  . POLYPECTOMY  11/12/2018   Procedure: POLYPECTOMY;  Surgeon: Laurence Spates, MD;  Location: [facility];  Service: Endoscopy;;  . TUMOR REMOVAL     small intestine x3  . VISCERAL ANGIOGRAPHY N/A 08/02/2019   Procedure: VISCERAL ANGIOGRAPHY;  Surgeon: Waynetta Sandy, MD;  Location: [facility];  Service: Cardiovascular;  Laterality: N/A;    Home Medications:  Allergies as of 02/16/2020   No Known Allergies     Medication List       Accurate as of February 16, 2020 12:09 PM. If you have any questions, ask your nurse or doctor.         acetaminophen 500 MG tablet Commonly known as: TYLENOL Take 1,000 mg by mouth in the morning and at bedtime.   aspirin 81 MG EC tablet Take 1 tablet (81 mg total) by mouth daily.   atorvastatin 40 MG tablet Commonly known as: LIPITOR Take 1 tablet (40 mg total) by mouth daily.   Auryxia 1 GM 210 MG(Fe) tablet Generic drug: ferric citrate Take 210-420 mg by mouth. Take 2 tablets three times a day with meals and 1 tablet daily with snacks   beclomethasone 80 MCG/ACT inhaler Commonly known as: QVAR Inhale 2 puffs into the lungs 2 (two) times daily as needed (shortness of breath).   bumetanide 2 MG tablet Commonly known as: BUMEX Take 2 mg by mouth 2 (two) times daily.  buPROPion 150 MG 12 hr tablet Commonly known as: WELLBUTRIN SR Take 150 mg by mouth 2 (two) times daily.   carvedilol 25 MG tablet Commonly known as: COREG Take 1 tablet (25 mg total) by mouth See admin instructions. Take 25 mg twice daily on Sun, Mon, Wed, and Fri. Take 25 mg in the evening on Tues, Thurs, and Sat (dialysis days)   diltiazem 30 MG tablet Commonly known as: CARDIZEM TAKE ONE TABLET BY MOUTH TWICE DAILY. What changed: additional instructions   ibuprofen 200 MG tablet Commonly known as: ADVIL Take 400 mg by mouth every 6 (six) hours as needed for mild pain or moderate pain.   insulin glargine 100 UNIT/ML injection Commonly known as: LANTUS Inject into the skin daily.   lidocaine 5 % Commonly known as: LIDODERM Place 1 patch onto the skin daily. Remove & Discard patch within 12 hours or as directed by MD   Linzess 290 MCG Caps capsule Generic drug: linaclotide Take 290 mcg by mouth daily as needed (constipation).   Melatonin 10 MG Tabs Take 10 mg by mouth at bedtime as needed (sleep).   morphine 15 MG tablet Commonly known as: MSIR Take 0.5 tablets (7.5 mg total) by mouth every 4 (four) hours as needed for severe pain.   multivitamin Tabs tablet Take 1 tablet by mouth daily.    nicotine 7 mg/24hr patch Commonly known as: NICODERM CQ - dosed in mg/24 hr Place 7 mg onto the skin daily.   nitroGLYCERIN 0.4 MG SL tablet Commonly known as: NITROSTAT Place 1 tablet (0.4 mg total) under the tongue every 5 (five) minutes as needed for chest pain.   omeprazole 20 MG capsule Commonly known as: PRILOSEC Take 20 mg by mouth daily.   polyethylene glycol 17 g packet Commonly known as: MIRALAX / GLYCOLAX Take 17 g by mouth daily as needed for mild constipation. Mix in morning coffee   risperiDONE 0.5 MG tablet Commonly known as: RISPERDAL Take 0.5 mg by mouth at bedtime.   Sennosides 25 MG Tabs Take 50 mg by mouth daily as needed (constipation).   silver sulfADIAZINE 1 % cream Commonly known as: SILVADENE Apply 1 application topically daily as needed (For wound care with dressing).   tamsulosin 0.4 MG Caps capsule Commonly known as: FLOMAX Take 0.4 mg by mouth.   traMADol 50 MG tablet Commonly known as: ULTRAM Take by mouth every 6 (six) hours as needed.   Ventolin HFA 108 (90 Base) MCG/ACT inhaler Generic drug: albuterol Inhale 2 puffs into the lungs every 6 (six) hours as needed for wheezing or shortness of breath.   Vitamin D3 50 MCG (2000 UT) Tabs Take 2,000 Units by mouth daily.       Allergies: No Known Allergies  Family History: Family History  Problem Relation Age of Onset  . Diabetes Mother   . Alcohol abuse Father     Social History:  reports that he has been smoking cigarettes. He started smoking about 51 years ago. He has a 40.00 pack-year smoking history. He has never used smokeless tobacco. He reports previous alcohol use. He reports that he does not use drugs.  ROS: All other review of systems were reviewed and are negative except what is noted above in HPI  Physical Exam: There were no vitals taken for this visit.  Constitutional:  Alert and oriented, No acute distress. HEENT: [place] AT, moist mucus membranes.  Trachea midline,  no masses. Cardiovascular: No clubbing, cyanosis, or edema. Respiratory: Normal respiratory  effort, no increased work of breathing. GI: Abdomen is soft, nontender, nondistended, no abdominal masses GU: No CVA tenderness.  Lymph: No cervical or inguinal lymphadenopathy. Skin: No rashes, bruises or suspicious lesions. Neurologic: Grossly intact, no focal deficits, moving all 4 extremities. Psychiatric: Normal mood and affect.  Laboratory Data: Lab Results  Component Value Date   WBC 7.2 11/23/2019   HGB 14.4 11/23/2019   HCT 43.9 11/23/2019   MCV 101.4 (H) 11/23/2019   PLT 193 11/23/2019    Lab Results  Component Value Date   CREATININE 4.72 (H) 11/23/2019    No results found for: PSA  Lab Results  Component Value Date   TESTOSTERONE 297 07/28/2015    Lab Results  Component Value Date   HGBA1C 5.8 (H) 04/14/2017    Urinalysis    Component Value Date/Time   COLORURINE YELLOW 07/22/2019 1625   APPEARANCEUR HAZY (A) 07/22/2019 1625   LABSPEC 1.010 07/22/2019 1625   PHURINE 6.0 07/22/2019 1625   GLUCOSEU NEGATIVE 07/22/2019 1625   HGBUR NEGATIVE 07/22/2019 1625   BILIRUBINUR NEGATIVE 07/22/2019 1625   KETONESUR NEGATIVE 07/22/2019 1625   PROTEINUR >=300 (A) 07/22/2019 1625   UROBILINOGEN 0.2 09/27/2014 0027   NITRITE NEGATIVE 07/22/2019 1625   LEUKOCYTESUR NEGATIVE 07/22/2019 1625    Lab Results  Component Value Date   BACTERIA RARE (A) 07/22/2019    Pertinent Imaging: MRI 11/17/2019: Images reviewed and discussed with the patient No results found for this or any previous visit.  No results found for this or any previous visit.  No results found for this or any previous visit.  No results found for this or any previous visit.  Results for orders placed during the hospital encounter of 04/04/16  US Renal  Narrative CLINICAL DATA:  Hematuria.  EXAM: RENAL / URINARY TRACT ULTRASOUND COMPLETE  COMPARISON:  09/13/2015  FINDINGS: Right Kidney:   Length: 13.0 cm. Echogenicity within normal limits. No mass or hydronephrosis visualized.  Left Kidney:  Length: 13.8 cm. Echogenicity is normal. Suspect mild renal parenchymal thinning. In the mid aspect of the left renal pelvis and may represent a vascular calcification or intrarenal stone.  Bladder:  Appears normal for degree of bladder distention.  IMPRESSION: 1. No hydronephrosis. 2. Vascular calcification versus intrarenal calculus in the mid pole region of the left kidney. 3. Mild renal parenchymal thinning of the left kidney.   Electronically Signed By: Nolon Nations M.D. On: 04/05/2016 15:42  No results found for this or any previous visit.  No results found for this or any previous visit.  No results found for this or any previous visit.   Assessment & Plan:    1. Difficulty urinating -COntinue flomax 0.4mg  daily -Add mirabegron 25mg  daily -RTC 4-6 weeks with PVR - BLADDER SCAN AMB NON-IMAGING  2. Renal mass -Stable bosniak 2 cyst. Followup 1 year with renal US   No follow-ups on file.  Nicolette Bang, MD  [facility] [place]

## 2020-02-16 NOTE — Progress Notes (Signed)
Urological Symptom Review  Patient is experiencing the following symptoms: Burning/pain with urination Leakage of urine Trouble starting stream Urinary tract infection   Review of Systems  Gastrointestinal (upper)  : Negative for upper GI symptoms  Gastrointestinal (lower) : Negative for lower GI symptoms  Constitutional : Negative for symptoms  Skin: Negative for skin symptoms  Eyes: Negative for eye symptoms  Ear/Nose/Throat : Negative for Ear/Nose/Throat symptoms  Hematologic/Lymphatic: Negative for Hematologic/Lymphatic symptoms  Cardiovascular : Negative for cardiovascular symptoms  Respiratory : Negative for respiratory symptoms  Endocrine: Negative for endocrine symptoms  Musculoskeletal: Back pain - pt recently broke his back and hip Neurological: Negative for neurological symptoms  Psychologic: Negative for psychiatric symptoms

## 2020-02-17 DIAGNOSIS — E1122 Type 2 diabetes mellitus with diabetic chronic kidney disease: Secondary | ICD-10-CM | POA: Diagnosis not present

## 2020-02-17 DIAGNOSIS — I120 Hypertensive chronic kidney disease with stage 5 chronic kidney disease or end stage renal disease: Secondary | ICD-10-CM | POA: Diagnosis not present

## 2020-02-17 DIAGNOSIS — Z992 Dependence on renal dialysis: Secondary | ICD-10-CM | POA: Diagnosis not present

## 2020-02-17 DIAGNOSIS — E7849 Other hyperlipidemia: Secondary | ICD-10-CM | POA: Diagnosis not present

## 2020-02-17 DIAGNOSIS — N186 End stage renal disease: Secondary | ICD-10-CM | POA: Diagnosis not present

## 2020-02-17 DIAGNOSIS — R112 Nausea with vomiting, unspecified: Secondary | ICD-10-CM | POA: Diagnosis not present

## 2020-02-17 DIAGNOSIS — F321 Major depressive disorder, single episode, moderate: Secondary | ICD-10-CM | POA: Diagnosis not present

## 2020-02-17 DIAGNOSIS — D509 Iron deficiency anemia, unspecified: Secondary | ICD-10-CM | POA: Diagnosis not present

## 2020-02-17 NOTE — Discharge Instructions (Signed)
Myelogram Discharge Instructions  1. Go home and rest quietly for the next 24 hours.  It is important to lie flat for the next 24 hours.  Get up only to go to the restroom.  You may lie in the bed or on a couch on your back, your stomach, your left side or your right side.  You may have one pillow under your head.  You may have pillows between your knees while you are on your side or under your knees while you are on your back.  2. DO NOT drive today.  Recline the seat as far back as it will go, while still wearing your seat belt, on the way home.  3. You may get up to go to the bathroom as needed.  You may sit up for 10 minutes to eat.  You may resume your normal diet and medications unless otherwise indicated.  Drink lots of extra fluids today and tomorrow.  4. The incidence of headache, nausea, or vomiting is about 5% (one in 20 patients).  If you develop a headache, lie flat and drink plenty of fluids until the headache goes away.  Caffeinated beverages may be helpful.  If you develop severe nausea and vomiting or a headache that does not go away with flat bed rest, call [phone].  5. You may resume normal activities after your 24 hours of bed rest is over; however, do not exert yourself strongly or do any heavy lifting tomorrow. If when you get up you have a headache when standing, go back to bed and force fluids for another 24 hours.  6. Call your physician for a follow-up appointment.  The results of your myelogram will be sent directly to your physician by the following day.  7. If you have any questions or if complications develop after you arrive home, please call [phone].  Discharge instructions have been explained to the patient.  The patient, or the person responsible for the patient, fully understands these instructions.  YOU MAY RESTART YOUR WELLBUTRIN, RISPERDAL AND TRAMADOL TOMORROW 02/18/20 AT 10:30AM.

## 2020-02-18 ENCOUNTER — Ambulatory Visit
Admission: RE | Admit: 2020-02-18 | Discharge: 2020-02-18 | Disposition: A | Discharge status: Home / Self Care | Payer: Medicare Other | Source: Ambulatory Visit | Attending: Student | Admitting: Student

## 2020-02-18 DIAGNOSIS — M48061 Spinal stenosis, lumbar region without neurogenic claudication: Secondary | ICD-10-CM | POA: Diagnosis not present

## 2020-02-18 DIAGNOSIS — M48062 Spinal stenosis, lumbar region with neurogenic claudication: Secondary | ICD-10-CM

## 2020-02-18 DIAGNOSIS — M4326 Fusion of spine, lumbar region: Secondary | ICD-10-CM | POA: Diagnosis not present

## 2020-02-18 LAB — URINE CULTURE: Organism ID, Bacteria: NO GROWTH

## 2020-02-18 MED ORDER — IOPAMIDOL (ISOVUE-M 200) INJECTION 41%
20.0000 mL | Freq: Once | INTRAMUSCULAR | Status: AC
  Administered 2020-02-18: 20 mL via INTRATHECAL

## 2020-02-18 MED ORDER — DIAZEPAM 5 MG PO TABS
5.0000 mg | ORAL_TABLET | Freq: Once | ORAL | Status: AC
  Administered 2020-02-18: 5 mg via ORAL

## 2020-02-18 NOTE — Progress Notes (Signed)
Patient states he has been off Wellbutrin, Risperidone and Tramadol for at least the past two days.

## 2020-02-19 DIAGNOSIS — D631 Anemia in chronic kidney disease: Secondary | ICD-10-CM | POA: Diagnosis not present

## 2020-02-19 DIAGNOSIS — D509 Iron deficiency anemia, unspecified: Secondary | ICD-10-CM | POA: Diagnosis not present

## 2020-02-19 DIAGNOSIS — R112 Nausea with vomiting, unspecified: Secondary | ICD-10-CM | POA: Diagnosis not present

## 2020-02-19 DIAGNOSIS — N186 End stage renal disease: Secondary | ICD-10-CM | POA: Diagnosis not present

## 2020-02-19 DIAGNOSIS — Z992 Dependence on renal dialysis: Secondary | ICD-10-CM | POA: Diagnosis not present

## 2020-02-19 DIAGNOSIS — Z23 Encounter for immunization: Secondary | ICD-10-CM | POA: Diagnosis not present

## 2020-02-21 DIAGNOSIS — Z23 Encounter for immunization: Secondary | ICD-10-CM | POA: Diagnosis not present

## 2020-02-21 DIAGNOSIS — Z992 Dependence on renal dialysis: Secondary | ICD-10-CM | POA: Diagnosis not present

## 2020-02-21 DIAGNOSIS — D509 Iron deficiency anemia, unspecified: Secondary | ICD-10-CM | POA: Diagnosis not present

## 2020-02-21 DIAGNOSIS — R112 Nausea with vomiting, unspecified: Secondary | ICD-10-CM | POA: Diagnosis not present

## 2020-02-21 DIAGNOSIS — N186 End stage renal disease: Secondary | ICD-10-CM | POA: Diagnosis not present

## 2020-02-21 DIAGNOSIS — F321 Major depressive disorder, single episode, moderate: Secondary | ICD-10-CM | POA: Diagnosis not present

## 2020-02-21 DIAGNOSIS — D631 Anemia in chronic kidney disease: Secondary | ICD-10-CM | POA: Diagnosis not present

## 2020-02-22 DIAGNOSIS — M62549 Muscle wasting and atrophy, not elsewhere classified, unspecified hand: Secondary | ICD-10-CM | POA: Diagnosis not present

## 2020-02-22 DIAGNOSIS — S32029S Unspecified fracture of second lumbar vertebra, sequela: Secondary | ICD-10-CM | POA: Diagnosis not present

## 2020-02-22 DIAGNOSIS — M48062 Spinal stenosis, lumbar region with neurogenic claudication: Secondary | ICD-10-CM | POA: Diagnosis not present

## 2020-02-22 NOTE — Progress Notes (Signed)
Results sent via my chart 

## 2020-02-24 ENCOUNTER — Other Ambulatory Visit: Payer: Self-pay | Admitting: Neurological Surgery

## 2020-02-24 DIAGNOSIS — Z992 Dependence on renal dialysis: Secondary | ICD-10-CM | POA: Diagnosis not present

## 2020-02-24 DIAGNOSIS — D509 Iron deficiency anemia, unspecified: Secondary | ICD-10-CM | POA: Diagnosis not present

## 2020-02-24 DIAGNOSIS — R112 Nausea with vomiting, unspecified: Secondary | ICD-10-CM | POA: Diagnosis not present

## 2020-02-24 DIAGNOSIS — I10 Essential (primary) hypertension: Secondary | ICD-10-CM | POA: Diagnosis not present

## 2020-02-24 DIAGNOSIS — Z23 Encounter for immunization: Secondary | ICD-10-CM | POA: Diagnosis not present

## 2020-02-24 DIAGNOSIS — S32029S Unspecified fracture of second lumbar vertebra, sequela: Secondary | ICD-10-CM

## 2020-02-24 DIAGNOSIS — D631 Anemia in chronic kidney disease: Secondary | ICD-10-CM | POA: Diagnosis not present

## 2020-02-24 DIAGNOSIS — N186 End stage renal disease: Secondary | ICD-10-CM | POA: Diagnosis not present

## 2020-02-25 ENCOUNTER — Other Ambulatory Visit: Payer: Self-pay

## 2020-02-25 ENCOUNTER — Ambulatory Visit
Admission: RE | Admit: 2020-02-25 | Discharge: 2020-02-25 | Disposition: A | Discharge status: Home / Self Care | Payer: Medicare Other | Source: Ambulatory Visit | Attending: Neurological Surgery | Admitting: Neurological Surgery

## 2020-02-25 DIAGNOSIS — M810 Age-related osteoporosis without current pathological fracture: Secondary | ICD-10-CM | POA: Diagnosis not present

## 2020-02-25 DIAGNOSIS — S32029S Unspecified fracture of second lumbar vertebra, sequela: Secondary | ICD-10-CM

## 2020-02-26 DIAGNOSIS — D509 Iron deficiency anemia, unspecified: Secondary | ICD-10-CM | POA: Diagnosis not present

## 2020-02-26 DIAGNOSIS — R112 Nausea with vomiting, unspecified: Secondary | ICD-10-CM | POA: Diagnosis not present

## 2020-02-26 DIAGNOSIS — Z992 Dependence on renal dialysis: Secondary | ICD-10-CM | POA: Diagnosis not present

## 2020-02-26 DIAGNOSIS — N186 End stage renal disease: Secondary | ICD-10-CM | POA: Diagnosis not present

## 2020-02-26 DIAGNOSIS — Z23 Encounter for immunization: Secondary | ICD-10-CM | POA: Diagnosis not present

## 2020-02-26 DIAGNOSIS — D631 Anemia in chronic kidney disease: Secondary | ICD-10-CM | POA: Diagnosis not present

## 2020-02-27 NOTE — Progress Notes (Addendum)
Cardiology Office Note  Date: 02/28/2020   ID: Shaylon, Aden 07-22-1950, MRN [id number]  PCP:  Manon Hilding, MD  Cardiologist:  Carlyle Dolly, MD Electrophysiologist:  Thompson Grayer, MD   Chief Complaint: Cardiac Follow up   History of Present Illness: AZLAAN ISIDORE is a 69 y.o. male with a history of PAF/atrial flutter, AAA, anemia, aortic atherosclerosis, CAD, CKD, CHF, COPD, DM 2, HLD, hypertension, morbid obesity, PVD, LV fistulogram, symptomatic bradycardia with pacemaker.  Last seen by Dr. Rayann Heman on 11/12/2018 for paroxysmal atrial fibrillation.Marland Kitchen  He was doing very well.  He was continuing atrial flutter but was unaware.  Having headaches with Imdur.  Had started dialysis this year and finding it to be challenging.  He denied any recent symptoms of palpitations, chest pain, shortness of breath, dizziness, syncope.  He had normal pacemaker function.  Continuing paroxysmal atrial fibrillation/atrial flutter.  Doing well atrial from/flutter low burden per remote checks.  CHA2DS2-VASc score was 3.  Due to history of anemia, prior GI bleeding and ESRD, or poor candidate for [facility] therapy long-term.  Dr. Rayann Heman discussed watchman device.  He was interested.  He was planning to get a PD catheter for dialysis in September.  His CAD was stable.  Hyperlipidemia was stable.  Recent emergency room visit in July 2021 with increased somnolence and hallucinations.  He was having episodes of continued back pain status post kyphoplasty 10 days prior.  He had previously been on Percocet and then was stopped after a fall 5 or 6 days prior.  This was thought to be the cause of his fall.  Since that time he had been experiencing visual hallucination and described seeing bugs on the floor and children and cats running around his house.  History of COPD but blood gas showed no evidence of CO2 retention.  Labs were normal.  Provider was uncertain as to the cause of withdrawal but suspected may be opioid  withdrawal.  Possible admission was discussed.  Patient did not want to stay in the hospital.  He he had a follow-up appointment with neurologist the next day regarding his dialysis and related headache.    Patient is here today for cardiac clearance for back surgery.  He recently had left hip fracture from a fall with surgery on July 13 at [facility].  Had a subsequent visit on July 30 for chest pain.  We do not have those records.  He states he was ruled out for any cardiac issue.  Had another visit June for back pain.  He is pending surgery with [facility] neurosurgery Dr. Ronnald Ramp.    History of multiple medical problems including; end-stage renal disease on hemodialysis on Tuesdays Thursdays and Saturdays, COPD, DM 2, CAD, hyperlipidemia, hypertension, symptomatic bradycardia with pacemaker sees Dr. Rayann Heman.  History of PAF he was doing well at last visit with Dr. Rayann Heman.     Cardiac catheterization earlier in the year in March 2021: Mild LM to distal LM lesion 25%.  Mid LAD lesion 40%, first diagonal-1 lesion 80%, first diagonal-2 lesion 90%, first diagonal-3 lesion 90%.  Ostial circumflex to proximal circumflex 75%, proximal RCA lesion 35%.  The diagonal was poorly suited for PCI due to diffuse and heavily calcified disease.  LCx was complex and would require atherectomy.  The acute angulation of the vessel would make it technically difficult.  Recommended for now aggressive medical therapy.    He is currently in a skilled nursing facility for rehab  status post left hip fracture.  He denies any anginal or exertional symptoms, palpitations or arrhythmias, orthostatic symptoms, CVA or TIA-like symptoms, bleeding issues, personally symptoms, DVT or PE-like symptoms, or lower extremity edema.  He continues to smoke per his wife.  Wife states patient would have been a candidate for renal transplant but due to his continued history of smoking he was deemed not to be a candidate.   Patient apparently continues to smoke.  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  Has 79 9% stenosis in SMA.  The inferior mesenteric artery appears stenotic.  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  Recent peripheral catheterization/aortogram.  Aorta had a small infrarenal aneurysm.  Heavy calcification but no flow-limiting stenosis.  The SMA was cannulated again calcification was identified without flow-limiting stenosis.  There were no interventions performed.  He denies any recent anginal or exertional symptoms, palpitations or arrhythmias, orthostatic symptoms, stroke or TIA-like symptoms, PND, orthopnea, bleeding, claudication, DVT or PE-like symptoms, or lower extremity edema.  He arrives on a stretcher from skilled nursing facility status post recent left hip fracture.  He is currently in SNF for rehab.  He continues to smoke per his wife statement.  She states he is pending back surgery but needs some other studies such as a myelograms and nerve conduction studies prior to having the surgery.  He is here for preop clearance as mentioned above.    Past Medical History:  Diagnosis Date  . AAA (abdominal aortic aneurysm) ([place]) 06/2016   Mild increase in size of 3.4 cm infrarenal abdominal aortic aneurysm  . Anemia   . Aortic atherosclerosis ([place])   . Asthma   . Atrial flutter ([place])   . AVF (arteriovenous fistula) ([facility])    Left forearm  . CAD (coronary artery disease)    a. s/p prior stenting of RCA in 1999 b. cath in 2003 showing moderate CAD c. NST in 2016 showing small area of ischemic and low-risk  . CHF (congestive heart failure) ([place])   . CKD (chronic kidney disease), stage V ([place])    kidney transplant evaluation  . COPD (chronic obstructive pulmonary disease) ([place])    with ongoing tobacco use and patient failed sham takes  . Diverticulosis 2018   Mild sigmoid colon diverticulosis.  Marland Kitchen DM2 (diabetes mellitus, type 2)  ([place])   . Dyslipidemia   . Dysrhythmia 2018   atrial fibrillation  . Edema    chronic lower extremity secondary to right heart failure and chronic venous insufficiency  . Fatigue   . Fatty liver 2010   Mild  . Headache   . HTN (hypertension)   . Morbid obesity ([place])   . Multiple pulmonary nodules 05/2018   Bilateral  . Pneumonia   . Presence of permanent cardiac pacemaker   . 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   Marland Kitchen Reflux esophagitis    hx  . Sleep apnea    PT STATES ONE TEST WAS POSITIVE AND ANOTHER TEST WAS NEGATIVE  . Two-vessel coronary artery disease    moderate. by cath in 2003. Status post stenting of the mdi RCA November 1999 normal left ventricular ejection fraction  . Wears glasses   . Wears hearing aid    B/L    Past Surgical History:  Procedure Laterality Date  . A/V FISTULAGRAM Left 04/30/2019   Procedure: A/V FISTULAGRAM;  Surgeon: Elam Dutch, MD;  Location:  [facility];  Service: Cardiovascular;  Laterality: Left;  . A/V FISTULAGRAM Left 09/22/2019   Procedure: A/V FISTULAGRAM;  Surgeon: Marty Heck, MD;  Location: [place];  Service: Cardiovascular;  Laterality: Left;  . ABDOMINAL SURGERY    . APPENDECTOMY    . AV FISTULA PLACEMENT Right 03/11/2018   Procedure: CREATION Brachiocephalic Fistula RIGHT ARM;  Surgeon: Rosetta Posner, MD;  Location: [facility] OR;  Service: Vascular;  Laterality: Right;  . AV FISTULA PLACEMENT Left 05/06/2019   Procedure: LEFT BRACHIOCEPHALIC ARTERIOVENOUS (AV) FISTULA CREATION;  Surgeon: Marty Heck, MD;  Location: [facility];  Service: Vascular;  Laterality: Left;  . BACK SURGERY     x3; cages in patient's back since 2000  . BIOPSY  11/12/2018   Procedure: BIOPSY;  Surgeon: Laurence Spates, MD;  Location: [facility];  Service: Endoscopy;;  . CARDIAC CATHETERIZATION     stent  . CHOLECYSTECTOMY    . CLOSED REDUCTION SHOULDER DISLOCATION      . COLONOSCOPY W/ BIOPSIES AND POLYPECTOMY    . COLONOSCOPY WITH PROPOFOL N/A 11/12/2018   Procedure: COLONOSCOPY WITH PROPOFOL;  Surgeon: Laurence Spates, MD;  Location: [facility];  Service: Endoscopy;  Laterality: N/A;  . CORONARY ANGIOPLASTY    . diabetic ulcers    . DIAGNOSTIC LAPAROSCOPY    . DIALYSIS/PERMA CATHETER REMOVAL N/A 09/22/2019   Procedure: DIALYSIS/PERMA CATHETER REMOVAL;  Surgeon: Marty Heck, MD;  Location: [place] CV [facility];  Service: Cardiovascular;  Laterality: N/A;  . ESOPHAGOGASTRODUODENOSCOPY (EGD) WITH PROPOFOL N/A 11/12/2018   Procedure: ESOPHAGOGASTRODUODENOSCOPY (EGD) WITH PROPOFOL;  Surgeon: Laurence Spates, MD;  Location: [facility];  Service: Endoscopy;  Laterality: N/A;  . GROIN EXPLORATION    . HEMOSTASIS CLIP PLACEMENT  11/12/2018   Procedure: HEMOSTASIS CLIP PLACEMENT;  Surgeon: Laurence Spates, MD;  Location: [facility];  Service: Endoscopy;;  . INSERT / REPLACE / REMOVE PACEMAKER    . INSERTION OF ARTERIOVENOUS (AV) ARTEGRAFT ARM Left 03/18/2018   Procedure: INSERTION OF ARTERIOVENOUS (AV) FISTULA;  Surgeon: Rosetta Posner, MD;  Location: [facility];  Service: Vascular;  Laterality: Left;  . IR FLUORO GUIDE CV LINE RIGHT  04/11/2019  . IR US GUIDE VASC ACCESS RIGHT  04/11/2019  . LEFT HEART CATH AND CORONARY ANGIOGRAPHY N/A 07/23/2019   Procedure: LEFT HEART CATH AND CORONARY ANGIOGRAPHY;  Surgeon: Martinique, Peter M, MD;  Location: [place] [facility];  Service: Cardiovascular;  Laterality: N/A;  . LIGATION ARTERIOVENOUS GORTEX GRAFT Right 03/18/2018   Procedure: LIGATION ARTERIOVENOUS FISTULA;  Surgeon: Rosetta Posner, MD;  Location: [facility];  Service: Vascular;  Laterality: Right;  . OSTECTOMY Left 04/23/2017   Procedure: OSTECTOMY LEFT GREAT TOE;  Surgeon: [name], DPM;  Location: [facility];  Service: Podiatry;  Laterality: Left;  left great toe  . POLYPECTOMY  11/12/2018   Procedure: POLYPECTOMY;  Surgeon: Laurence Spates, MD;  Location: [facility];  Service: Endoscopy;;  . TUMOR REMOVAL     small intestine x3  . VISCERAL ANGIOGRAPHY N/A 08/02/2019   Procedure: VISCERAL ANGIOGRAPHY;  Surgeon: Waynetta Sandy, MD;  Location: [place] [facility];  Service: Cardiovascular;  Laterality: N/A;    Current Outpatient Medications  Medication Sig Dispense Refill  . acetaminophen (TYLENOL) 500 MG tablet Take 1,000 mg by mouth in the morning and at bedtime.     Marland Kitchen albuterol (VENTOLIN HFA) 108 (90 BASE) MCG/ACT inhaler Inhale 2 puffs into the lungs every 6 (six) hours as needed for wheezing  or shortness of breath.     Marland Kitchen aspirin EC 81 MG EC tablet Take 1 tablet (81 mg total) by mouth daily.    Marland Kitchen atorvastatin (LIPITOR) 40 MG tablet Take 1 tablet (40 mg total) by mouth daily. 90 tablet 3  . beclomethasone (QVAR) 80 MCG/ACT inhaler Inhale 2 puffs into the lungs 2 (two) times daily as needed (shortness of breath).    . bumetanide (BUMEX) 2 MG tablet Take 2 mg by mouth 2 (two) times daily.    Marland Kitchen buPROPion (WELLBUTRIN SR) 150 MG 12 hr tablet Take 150 mg by mouth 2 (two) times daily.    . carvedilol (COREG) 25 MG tablet Take 1 tablet (25 mg total) by mouth See admin instructions. Take 25 mg twice daily on Sun, Mon, Wed, and Fri. Take 25 mg in the evening on Tues, Thurs, and Sat (dialysis days) 180 tablet 2  . Cholecalciferol (VITAMIN D3) 2000 units TABS Take 2,000 Units by mouth daily.     Marland Kitchen diltiazem (CARDIZEM) 30 MG tablet TAKE ONE TABLET BY MOUTH TWICE DAILY. (Patient taking differently: Take 30 mg by mouth 2 (two) times daily. Take 30 mg twice daily on Sun, Mon, Wed, and Fri. Take 25 mg in the evening on Tues, Thurs, and Sat (dialysis days)) 60 tablet 6  . ferric citrate (AURYXIA) 1 GM 210 MG(Fe) tablet Take 210-420 mg by mouth. Take 2 tablets three times a day with meals and 1 tablet daily with snacks    . ibuprofen (ADVIL) 200 MG tablet Take 400 mg by mouth every 6 (six) hours as needed for mild pain or moderate pain.    Marland Kitchen insulin  glargine (LANTUS) 100 UNIT/ML injection Inject into the skin daily.    Marland Kitchen lidocaine (LIDODERM) 5 % Place 1 patch onto the skin daily. Remove & Discard patch within 12 hours or as directed by MD    . linaclotide (LINZESS) 290 MCG CAPS capsule Take 290 mcg by mouth daily as needed (constipation).    . Melatonin 10 MG TABS Take 10 mg by mouth at bedtime as needed (sleep).     . mirabegron ER (MYRBETRIQ) 25 MG TB24 tablet Take 1 tablet (25 mg total) by mouth daily. 30 tablet 0  . morphine (MSIR) 15 MG tablet Take 0.5 tablets (7.5 mg total) by mouth every 4 (four) hours as needed for severe pain. 5 tablet 0  . multivitamin (RENA-VIT) TABS tablet Take 1 tablet by mouth daily.    . nicotine (NICODERM CQ - DOSED IN MG/24 HR) 7 mg/24hr patch Place 7 mg onto the skin daily.    . nitroGLYCERIN (NITROSTAT) 0.4 MG SL tablet Place 1 tablet (0.4 mg total) under the tongue every 5 (five) minutes as needed for chest pain. 25 tablet 3  . omeprazole (PRILOSEC) 20 MG capsule Take 20 mg by mouth daily.     . polyethylene glycol (MIRALAX / GLYCOLAX) packet Take 17 g by mouth daily as needed for mild constipation. Mix in morning coffee    . risperiDONE (RISPERDAL) 0.5 MG tablet Take 0.5 mg by mouth at bedtime.    . Sennosides 25 MG TABS Take 50 mg by mouth daily as needed (constipation).     . silver sulfADIAZINE (SILVADENE) 1 % cream Apply 1 application topically daily as needed (For wound care with dressing).     . tamsulosin (FLOMAX) 0.4 MG CAPS capsule Take 1 capsule (0.4 mg total) by mouth daily after supper. 90 capsule 3  . traMADol (ULTRAM) 50  MG tablet Take by mouth every 6 (six) hours as needed.     No current facility-administered medications for this visit.   Allergies:  Patient has no known allergies.   Social History: The patient  reports that he quit smoking about 2 months ago. His smoking use included cigarettes. He started smoking about 51 years ago. He has a 40.00 pack-year smoking history. He has  never used smokeless tobacco. He reports previous alcohol use. He reports that he does not use drugs.   Family History: The patient's family history includes Alcohol abuse in his father; Diabetes in his mother.   ROS:  Please see the history of present illness. Otherwise, complete review of systems is positive for none.  All other systems are reviewed and negative.   Physical Exam: VS:  BP 130/60   Pulse (!) 53   Ht 6\' 4"  (1.93 m)   Wt 203 lb (92.1 kg)   SpO2 92%   BMI 24.71 kg/m , BMI Body mass index is 24.71 kg/m.  Wt Readings from Last 3 Encounters:  02/28/20 203 lb (92.1 kg)  11/23/19 230 lb (104.3 kg)  11/17/19 230 lb (104.3 kg)    General: Patient appears comfortable at rest. Neck: Supple, no elevated JVP or carotid bruits, no thyromegaly. Lungs: Clear to auscultation, nonlabored breathing at rest. Cardiac: Regular rate and rhythm, no S3 or significant systolic murmur, no pericardial rub. Extremities: No pitting edema, distal pulses 2+. Skin: Warm and dry. Musculoskeletal: No kyphosis. Neuropsychiatric: Alert and oriented x3, affect grossly appropriate.  ECG:  EKG 11/23/2019 sinus rhythm with first-degree AV block, left axis deviation, heart rate of 65.  Recent Labwork: 04/10/2019: B Natriuretic Peptide 1,349.3 04/11/2019: Magnesium 2.0 11/23/2019: ALT 19; AST 33; BUN 29; Creatinine, Ser 4.72; Hemoglobin 14.4; Platelets 193; Potassium 4.3; Sodium 130     Component Value Date/Time   CHOL 93 07/23/2019 0557   TRIG 95 07/23/2019 0557   HDL 38 (L) 07/23/2019 0557   CHOLHDL 2.4 07/23/2019 0557   VLDL 19 07/23/2019 0557   LDLCALC 36 07/23/2019 0557    Other Studies Reviewed Today:  Cardiac catheterization 07/23/2019 LEFT HEART CATH AND CORONARY ANGIOGRAPHY  Conclusion    Mid LM to Dist LM lesion is 25% stenosed.  Mid LAD lesion is 40% stenosed.  1st Diag-1 lesion is 80% stenosed.  1st Diag-2 lesion is 90% stenosed.  1st Diag-3 lesion is 90% stenosed.  Ost Cx  to Prox Cx lesion is 75% stenosed.  Mid Cx lesion is 90% stenosed.  Prox RCA lesion is 35% stenosed.  Dist RCA lesion is 50% stenosed.  The left ventricular systolic function is normal.  LV end diastolic pressure is normal.  The left ventricular ejection fraction is 55-65% by visual estimate.   1. Severe 2 vessel obstructive CAD. The RCA and LAD disease is stable compared to 2003. There is progressive disease in a large diagonal branch and the LCx    - the diagonal is a large bifurcating vessel.  It has multiple severe stenoses in the main branch with severely calcified lesions    - 75% proximal LCx and 90% mid LCx. There is an acutely angulated take off from the left main and the vessel is severely calcified. 2. Normal LV function 3. Normal LVEDP  Plan: discussed with Dr Burt Knack. The diagonal is poorly suited for PCI due to diffuse and heavily calcified disease. The LCx is complex and would require atherectomy. The acute angulation would make it technically difficult. For now  would recommend aggressive medical therapy.   Diagnostic Dominance: Right    Mesenteric limited ultrasound 07/30/2019 Summary: Mesenteric: 70 to 99% stenosis in the superior mesenteric artery. The Inferior Mesenteric artery appears stenotic. Aneurysmal dilatation of the distal aorta and right CIA.   Visceral angiography 08/02/2019 Pre-operative Diagnosis: esrd, abdominal pain Post-operative diagnosis:  Same Surgeon:  Erlene Quan C. Donzetta Matters, MD Procedure Performed: 1.  US guided cannulation of right common femoral artery 2.  Aortogram 3.  Selection of superior mesenteric artery with selective angiography In the much contrast  Indications: 69 year old male with end-stage renal disease.  He is on dialysis Tuesdays Thursdays and Saturdays.  He has had severe abdominal pain on dialysis.  He is indicated for angiography with concern for SMA stenosis on recent mesenteric ultrasound.  Findings: Aorta has a  small infrarenal aneurysm.  There is heavy calcification but no flow-limiting stenosis.  The SMA was cannulated again calcification was identified without flow-limiting stenosis.  No intervention was undertaken.                Procedure:  The patient was identified in the holding area and taken to room 8.  The patient was then placed supine on the table and prepped and draped in the usual sterile fashion.  A time out was called.  Ultrasound was used to evaluate the right common femoral artery.  This was heavily calcified.  Was cannulated retrosternal visualization a micropuncture needle followed the wire and sheath.  Images saved the permanent record.  Bentson wire was placed followed by 5 [place] sheath.  We placed an Omni catheter to the level of T12.  Lateral angiography was performed.  I do not see any stenosis there.  I cannulated the SMA directly.  I performed lateral and anterior angiography did not identify any stenoses there.  Catheter was removed over wire.  Sheath was removed pressure held until hemostasis obtained.  He tolerated procedure without any complication.   Most recent TTE 12/15/18 1. The left ventricle has normal systolic function with an ejection  fraction of 60-65%. The cavity size was normal. There is severely  increased left ventricular wall thickness. Left ventricular diastolic  Doppler parameters are consistent with impaired  relaxation.  2. The right ventricle has normal systolic function. The cavity was  normal. There is mildly increased right ventricular wall thickness.  3. Left atrial size was moderately dilated.  4. The aortic valve is tricuspid. Mild thickening of the aortic valve.  Mild calcification of the aortic valve. No stenosis of the aortic valve.  Mild aortic annular calcification noted.  5. The mitral valve is abnormal. Mild thickening of the mitral valve  leaflet. Mild calcification of the mitral valve leaflet. There is mild  mitral annular  calcification present. No evidence of mitral valve  stenosis.  6. The aorta is normal in size and structure.  7. The aortic root is normal in size and structure.  8. Pulmonary hypertension is indeterminate, inadequate TR jet.  9. The inferior vena cava was dilated in size with >50% respiratory  variability.   Assessment and Plan:  1. Preoperative clearance   2. Symptomatic sinus bradycardia   3. PAF (paroxysmal atrial fibrillation) ([place])   4. CAD in native artery   5. ESRD (end stage renal disease) ([place])   6. Mixed hyperlipidemia    1. Preoperative clearance Patient is here for preop clearance pending back surgery with [facility] neurosurgery with Dr. Ronnald Ramp.  Patient has multiple medical problems as listed in HPI  above.  He is recovering from left hip surgery status post left hip fracture from a fall in a skilled nursing facility for rehab.  Revised cardiac risk index score is 3 putting the patient at a perioperative risk of major cardiac event of 11%.  His [facility] activity status index was based on his prior activity before hip surgery after fall.  [facility] activity status index score was 50.7 with a functional capacity and METS was 8.9.  Based on patient's history of multiple medical problems including coronary artery disease and vascular disease and other comorbid conditions he would be considered a moderate risk for back surgery under general anesthesia from a cardiac standpoint.  2. Symptomatic sinus bradycardia Has a pacemaker in place for symptomatic bradycardia.  Sees Dr. Rayann Heman.  Recent remote device check reviewed. Normal device function. Battery status, leads stable. Histograms reviewed and appropriate.    3. PAF (paroxysmal atrial fibrillation) ([place]) 11/23/2019 EKG sinus rhythm first-degree AV block, left axis deviation, minimal voltage criteria for LVH.  Heart rate is 65.  4. CAD in native artery Significant two-vessel disease.  Denies any current anginal or exertional symptoms.   See cardiac catheterization report above.  Continue aspirin 81 mg, carvedilol 25 mg p.o. twice daily, nitroglycerin sublingual as needed chest pain   5. ESRD (end stage renal disease) ([place]) Undergoing hemodialysis on Tuesdays Thursdays and Saturdays.  He has been undergoing dialysis for approximately 1 year.  States he does not tolerate it very well.  Wife states at 1 point there was consideration for possible renal transplant.  However patient was required to stop smoking.  Apparently he did not stop smoking which caused him not to be eligible for a kidney.  6. Mixed hyperlipidemia Recent lipid panel July 23, 2019: TC 93, TG 95, HDL 38, LDL 36.  Continue atorvastatin 40 mg daily.   Medication Adjustments/Labs and Tests Ordered: Current medicines are reviewed at length with the patient today.  Concerns regarding medicines are outlined above.   Disposition: Follow-up with Dr. [name] or APP 6 months.  Signed, Levell July, NP 02/28/2020 11:24 AM    [facility] [place], [place], [place] [address] Phone: [phone]; Fax: [phone]

## 2020-02-28 ENCOUNTER — Ambulatory Visit (INDEPENDENT_AMBULATORY_CARE_PROVIDER_SITE_OTHER): Payer: Medicare Other | Admitting: Family Medicine

## 2020-02-28 ENCOUNTER — Encounter: Payer: Self-pay | Admitting: Family Medicine

## 2020-02-28 VITALS — BP 130/60 | HR 53 | Ht 76.0 in | Wt 203.0 lb

## 2020-02-28 DIAGNOSIS — I2510 Atherosclerotic heart disease of native coronary artery without angina pectoris: Secondary | ICD-10-CM

## 2020-02-28 DIAGNOSIS — I480 Paroxysmal atrial fibrillation: Secondary | ICD-10-CM

## 2020-02-28 DIAGNOSIS — E782 Mixed hyperlipidemia: Secondary | ICD-10-CM | POA: Diagnosis not present

## 2020-02-28 DIAGNOSIS — R001 Bradycardia, unspecified: Secondary | ICD-10-CM

## 2020-02-28 DIAGNOSIS — Z01818 Encounter for other preprocedural examination: Secondary | ICD-10-CM | POA: Diagnosis not present

## 2020-02-28 DIAGNOSIS — N186 End stage renal disease: Secondary | ICD-10-CM | POA: Diagnosis not present

## 2020-02-28 NOTE — Patient Instructions (Signed)
Medication Instructions:  Continue all current medications.   Labwork: none  Testing/Procedures: none  Follow-Up: 6 months   Any Other Special Instructions Will Be Listed Below (If Applicable).   If you need a refill on your cardiac medications before your next appointment, please call your pharmacy.  

## 2020-02-29 DIAGNOSIS — Z23 Encounter for immunization: Secondary | ICD-10-CM | POA: Diagnosis not present

## 2020-02-29 DIAGNOSIS — N186 End stage renal disease: Secondary | ICD-10-CM | POA: Diagnosis not present

## 2020-02-29 DIAGNOSIS — D631 Anemia in chronic kidney disease: Secondary | ICD-10-CM | POA: Diagnosis not present

## 2020-02-29 DIAGNOSIS — D509 Iron deficiency anemia, unspecified: Secondary | ICD-10-CM | POA: Diagnosis not present

## 2020-02-29 DIAGNOSIS — Z992 Dependence on renal dialysis: Secondary | ICD-10-CM | POA: Diagnosis not present

## 2020-02-29 DIAGNOSIS — R112 Nausea with vomiting, unspecified: Secondary | ICD-10-CM | POA: Diagnosis not present

## 2020-03-01 NOTE — Progress Notes (Signed)
Would agree with moderate but not prohibitive risk. Cath earlier this year reviewed, disease not easily amenable to PCI treated medically. In absence of active cardiac symptoms would recommend proceeding with surgery   Zandra Abts MD

## 2020-03-02 DIAGNOSIS — R112 Nausea with vomiting, unspecified: Secondary | ICD-10-CM | POA: Diagnosis not present

## 2020-03-02 DIAGNOSIS — Z23 Encounter for immunization: Secondary | ICD-10-CM | POA: Diagnosis not present

## 2020-03-02 DIAGNOSIS — N186 End stage renal disease: Secondary | ICD-10-CM | POA: Diagnosis not present

## 2020-03-02 DIAGNOSIS — D631 Anemia in chronic kidney disease: Secondary | ICD-10-CM | POA: Diagnosis not present

## 2020-03-02 DIAGNOSIS — D509 Iron deficiency anemia, unspecified: Secondary | ICD-10-CM | POA: Diagnosis not present

## 2020-03-02 DIAGNOSIS — Z992 Dependence on renal dialysis: Secondary | ICD-10-CM | POA: Diagnosis not present

## 2020-03-04 DIAGNOSIS — N186 End stage renal disease: Secondary | ICD-10-CM | POA: Diagnosis not present

## 2020-03-04 DIAGNOSIS — R112 Nausea with vomiting, unspecified: Secondary | ICD-10-CM | POA: Diagnosis not present

## 2020-03-04 DIAGNOSIS — Z992 Dependence on renal dialysis: Secondary | ICD-10-CM | POA: Diagnosis not present

## 2020-03-04 DIAGNOSIS — Z23 Encounter for immunization: Secondary | ICD-10-CM | POA: Diagnosis not present

## 2020-03-04 DIAGNOSIS — D631 Anemia in chronic kidney disease: Secondary | ICD-10-CM | POA: Diagnosis not present

## 2020-03-04 DIAGNOSIS — D509 Iron deficiency anemia, unspecified: Secondary | ICD-10-CM | POA: Diagnosis not present

## 2020-03-04 LAB — CUP PACEART REMOTE DEVICE CHECK
Battery Remaining Longevity: 168 mo
Battery Remaining Percentage: 100 %
Brady Statistic RA Percent Paced: 9 %
Brady Statistic RV Percent Paced: 2 %
Date Time Interrogation Session: 20211015192500
Implantable Lead Implant Date: 20190514
Implantable Lead Implant Date: 20190514
Implantable Lead Location: 753859
Implantable Lead Location: 753860
Implantable Lead Model: 7741
Implantable Lead Model: 7742
Implantable Lead Serial Number: 1015225
Implantable Lead Serial Number: 1020907
Implantable Pulse Generator Implant Date: 20190514
Lead Channel Impedance Value: 1043 Ohm
Lead Channel Impedance Value: 717 Ohm
Lead Channel Pacing Threshold Amplitude: 1.6 V
Lead Channel Pacing Threshold Amplitude: 2.6 V
Lead Channel Pacing Threshold Pulse Width: 0.4 ms
Lead Channel Pacing Threshold Pulse Width: 0.4 ms
Lead Channel Setting Pacing Amplitude: 2 V
Lead Channel Setting Pacing Amplitude: 2.5 V
Lead Channel Setting Pacing Pulse Width: 0.4 ms
Lead Channel Setting Sensing Sensitivity: 1.5 mV
Pulse Gen Serial Number: 832937

## 2020-03-06 ENCOUNTER — Ambulatory Visit (INDEPENDENT_AMBULATORY_CARE_PROVIDER_SITE_OTHER): Payer: Medicare Other

## 2020-03-06 DIAGNOSIS — S32000A Wedge compression fracture of unspecified lumbar vertebra, initial encounter for closed fracture: Secondary | ICD-10-CM | POA: Diagnosis not present

## 2020-03-06 DIAGNOSIS — I10 Essential (primary) hypertension: Secondary | ICD-10-CM | POA: Diagnosis not present

## 2020-03-06 DIAGNOSIS — E782 Mixed hyperlipidemia: Secondary | ICD-10-CM | POA: Diagnosis not present

## 2020-03-06 DIAGNOSIS — E1165 Type 2 diabetes mellitus with hyperglycemia: Secondary | ICD-10-CM | POA: Diagnosis not present

## 2020-03-06 DIAGNOSIS — R443 Hallucinations, unspecified: Secondary | ICD-10-CM | POA: Diagnosis not present

## 2020-03-06 DIAGNOSIS — S72002A Fracture of unspecified part of neck of left femur, initial encounter for closed fracture: Secondary | ICD-10-CM | POA: Diagnosis not present

## 2020-03-06 DIAGNOSIS — R55 Syncope and collapse: Secondary | ICD-10-CM | POA: Diagnosis not present

## 2020-03-06 DIAGNOSIS — E1122 Type 2 diabetes mellitus with diabetic chronic kidney disease: Secondary | ICD-10-CM | POA: Diagnosis not present

## 2020-03-06 DIAGNOSIS — N186 End stage renal disease: Secondary | ICD-10-CM | POA: Diagnosis not present

## 2020-03-07 DIAGNOSIS — R112 Nausea with vomiting, unspecified: Secondary | ICD-10-CM | POA: Diagnosis not present

## 2020-03-07 DIAGNOSIS — D509 Iron deficiency anemia, unspecified: Secondary | ICD-10-CM | POA: Diagnosis not present

## 2020-03-07 DIAGNOSIS — D631 Anemia in chronic kidney disease: Secondary | ICD-10-CM | POA: Diagnosis not present

## 2020-03-07 DIAGNOSIS — Z23 Encounter for immunization: Secondary | ICD-10-CM | POA: Diagnosis not present

## 2020-03-07 DIAGNOSIS — Z992 Dependence on renal dialysis: Secondary | ICD-10-CM | POA: Diagnosis not present

## 2020-03-07 DIAGNOSIS — N186 End stage renal disease: Secondary | ICD-10-CM | POA: Diagnosis not present

## 2020-03-08 DIAGNOSIS — E1122 Type 2 diabetes mellitus with diabetic chronic kidney disease: Secondary | ICD-10-CM | POA: Diagnosis not present

## 2020-03-08 DIAGNOSIS — I495 Sick sinus syndrome: Secondary | ICD-10-CM | POA: Diagnosis not present

## 2020-03-08 DIAGNOSIS — Z9181 History of falling: Secondary | ICD-10-CM | POA: Diagnosis not present

## 2020-03-08 DIAGNOSIS — S72142D Displaced intertrochanteric fracture of left femur, subsequent encounter for closed fracture with routine healing: Secondary | ICD-10-CM | POA: Diagnosis not present

## 2020-03-08 DIAGNOSIS — E782 Mixed hyperlipidemia: Secondary | ICD-10-CM | POA: Diagnosis not present

## 2020-03-08 DIAGNOSIS — I120 Hypertensive chronic kidney disease with stage 5 chronic kidney disease or end stage renal disease: Secondary | ICD-10-CM | POA: Diagnosis not present

## 2020-03-08 DIAGNOSIS — Z950 Presence of cardiac pacemaker: Secondary | ICD-10-CM | POA: Diagnosis not present

## 2020-03-08 DIAGNOSIS — I731 Thromboangiitis obliterans [Buerger's disease]: Secondary | ICD-10-CM | POA: Diagnosis not present

## 2020-03-08 DIAGNOSIS — Z7982 Long term (current) use of aspirin: Secondary | ICD-10-CM | POA: Diagnosis not present

## 2020-03-08 DIAGNOSIS — N401 Enlarged prostate with lower urinary tract symptoms: Secondary | ICD-10-CM | POA: Diagnosis not present

## 2020-03-08 DIAGNOSIS — K5909 Other constipation: Secondary | ICD-10-CM | POA: Diagnosis not present

## 2020-03-08 DIAGNOSIS — F17210 Nicotine dependence, cigarettes, uncomplicated: Secondary | ICD-10-CM | POA: Diagnosis not present

## 2020-03-08 DIAGNOSIS — I2510 Atherosclerotic heart disease of native coronary artery without angina pectoris: Secondary | ICD-10-CM | POA: Diagnosis not present

## 2020-03-08 DIAGNOSIS — Z992 Dependence on renal dialysis: Secondary | ICD-10-CM | POA: Diagnosis not present

## 2020-03-08 DIAGNOSIS — N186 End stage renal disease: Secondary | ICD-10-CM | POA: Diagnosis not present

## 2020-03-08 DIAGNOSIS — I480 Paroxysmal atrial fibrillation: Secondary | ICD-10-CM | POA: Diagnosis not present

## 2020-03-08 DIAGNOSIS — D631 Anemia in chronic kidney disease: Secondary | ICD-10-CM | POA: Diagnosis not present

## 2020-03-08 DIAGNOSIS — M48061 Spinal stenosis, lumbar region without neurogenic claudication: Secondary | ICD-10-CM | POA: Diagnosis not present

## 2020-03-08 DIAGNOSIS — J449 Chronic obstructive pulmonary disease, unspecified: Secondary | ICD-10-CM | POA: Diagnosis not present

## 2020-03-08 DIAGNOSIS — S72002D Fracture of unspecified part of neck of left femur, subsequent encounter for closed fracture with routine healing: Secondary | ICD-10-CM | POA: Diagnosis not present

## 2020-03-08 DIAGNOSIS — F349 Persistent mood [affective] disorder, unspecified: Secondary | ICD-10-CM | POA: Diagnosis not present

## 2020-03-08 NOTE — Progress Notes (Signed)
Remote pacemaker transmission.   

## 2020-03-09 DIAGNOSIS — D509 Iron deficiency anemia, unspecified: Secondary | ICD-10-CM | POA: Diagnosis not present

## 2020-03-09 DIAGNOSIS — N186 End stage renal disease: Secondary | ICD-10-CM | POA: Diagnosis not present

## 2020-03-09 DIAGNOSIS — R112 Nausea with vomiting, unspecified: Secondary | ICD-10-CM | POA: Diagnosis not present

## 2020-03-09 DIAGNOSIS — Z992 Dependence on renal dialysis: Secondary | ICD-10-CM | POA: Diagnosis not present

## 2020-03-09 DIAGNOSIS — Z23 Encounter for immunization: Secondary | ICD-10-CM | POA: Diagnosis not present

## 2020-03-09 DIAGNOSIS — D631 Anemia in chronic kidney disease: Secondary | ICD-10-CM | POA: Diagnosis not present

## 2020-03-10 DIAGNOSIS — E1122 Type 2 diabetes mellitus with diabetic chronic kidney disease: Secondary | ICD-10-CM | POA: Diagnosis not present

## 2020-03-10 DIAGNOSIS — I480 Paroxysmal atrial fibrillation: Secondary | ICD-10-CM | POA: Diagnosis not present

## 2020-03-10 DIAGNOSIS — S72002D Fracture of unspecified part of neck of left femur, subsequent encounter for closed fracture with routine healing: Secondary | ICD-10-CM | POA: Diagnosis not present

## 2020-03-10 DIAGNOSIS — I120 Hypertensive chronic kidney disease with stage 5 chronic kidney disease or end stage renal disease: Secondary | ICD-10-CM | POA: Diagnosis not present

## 2020-03-10 DIAGNOSIS — J449 Chronic obstructive pulmonary disease, unspecified: Secondary | ICD-10-CM | POA: Diagnosis not present

## 2020-03-10 DIAGNOSIS — N186 End stage renal disease: Secondary | ICD-10-CM | POA: Diagnosis not present

## 2020-03-11 DIAGNOSIS — R112 Nausea with vomiting, unspecified: Secondary | ICD-10-CM | POA: Diagnosis not present

## 2020-03-11 DIAGNOSIS — N186 End stage renal disease: Secondary | ICD-10-CM | POA: Diagnosis not present

## 2020-03-11 DIAGNOSIS — D631 Anemia in chronic kidney disease: Secondary | ICD-10-CM | POA: Diagnosis not present

## 2020-03-11 DIAGNOSIS — Z23 Encounter for immunization: Secondary | ICD-10-CM | POA: Diagnosis not present

## 2020-03-11 DIAGNOSIS — Z992 Dependence on renal dialysis: Secondary | ICD-10-CM | POA: Diagnosis not present

## 2020-03-11 DIAGNOSIS — D509 Iron deficiency anemia, unspecified: Secondary | ICD-10-CM | POA: Diagnosis not present

## 2020-03-14 DIAGNOSIS — R112 Nausea with vomiting, unspecified: Secondary | ICD-10-CM | POA: Diagnosis not present

## 2020-03-14 DIAGNOSIS — Z992 Dependence on renal dialysis: Secondary | ICD-10-CM | POA: Diagnosis not present

## 2020-03-14 DIAGNOSIS — Z23 Encounter for immunization: Secondary | ICD-10-CM | POA: Diagnosis not present

## 2020-03-14 DIAGNOSIS — N186 End stage renal disease: Secondary | ICD-10-CM | POA: Diagnosis not present

## 2020-03-14 DIAGNOSIS — D509 Iron deficiency anemia, unspecified: Secondary | ICD-10-CM | POA: Diagnosis not present

## 2020-03-14 DIAGNOSIS — R109 Unspecified abdominal pain: Secondary | ICD-10-CM | POA: Diagnosis not present

## 2020-03-14 DIAGNOSIS — D631 Anemia in chronic kidney disease: Secondary | ICD-10-CM | POA: Diagnosis not present

## 2020-03-15 DIAGNOSIS — N186 End stage renal disease: Secondary | ICD-10-CM | POA: Diagnosis not present

## 2020-03-15 DIAGNOSIS — E1122 Type 2 diabetes mellitus with diabetic chronic kidney disease: Secondary | ICD-10-CM | POA: Diagnosis not present

## 2020-03-15 DIAGNOSIS — I120 Hypertensive chronic kidney disease with stage 5 chronic kidney disease or end stage renal disease: Secondary | ICD-10-CM | POA: Diagnosis not present

## 2020-03-15 DIAGNOSIS — S72002D Fracture of unspecified part of neck of left femur, subsequent encounter for closed fracture with routine healing: Secondary | ICD-10-CM | POA: Diagnosis not present

## 2020-03-15 DIAGNOSIS — J449 Chronic obstructive pulmonary disease, unspecified: Secondary | ICD-10-CM | POA: Diagnosis not present

## 2020-03-15 DIAGNOSIS — I480 Paroxysmal atrial fibrillation: Secondary | ICD-10-CM | POA: Diagnosis not present

## 2020-03-16 DIAGNOSIS — Z992 Dependence on renal dialysis: Secondary | ICD-10-CM | POA: Diagnosis not present

## 2020-03-16 DIAGNOSIS — D631 Anemia in chronic kidney disease: Secondary | ICD-10-CM | POA: Diagnosis not present

## 2020-03-16 DIAGNOSIS — Z23 Encounter for immunization: Secondary | ICD-10-CM | POA: Diagnosis not present

## 2020-03-16 DIAGNOSIS — D509 Iron deficiency anemia, unspecified: Secondary | ICD-10-CM | POA: Diagnosis not present

## 2020-03-16 DIAGNOSIS — N186 End stage renal disease: Secondary | ICD-10-CM | POA: Diagnosis not present

## 2020-03-16 DIAGNOSIS — R112 Nausea with vomiting, unspecified: Secondary | ICD-10-CM | POA: Diagnosis not present

## 2020-03-17 ENCOUNTER — Ambulatory Visit (INDEPENDENT_AMBULATORY_CARE_PROVIDER_SITE_OTHER): Payer: Medicare Other | Admitting: Urology

## 2020-03-17 ENCOUNTER — Other Ambulatory Visit: Payer: Self-pay

## 2020-03-17 ENCOUNTER — Encounter: Payer: Self-pay | Admitting: Urology

## 2020-03-17 VITALS — BP 96/56 | HR 62 | Temp 97.8°F | Ht 74.0 in | Wt 203.0 lb

## 2020-03-17 DIAGNOSIS — N401 Enlarged prostate with lower urinary tract symptoms: Secondary | ICD-10-CM

## 2020-03-17 DIAGNOSIS — J449 Chronic obstructive pulmonary disease, unspecified: Secondary | ICD-10-CM | POA: Diagnosis not present

## 2020-03-17 DIAGNOSIS — E1122 Type 2 diabetes mellitus with diabetic chronic kidney disease: Secondary | ICD-10-CM | POA: Diagnosis not present

## 2020-03-17 DIAGNOSIS — N186 End stage renal disease: Secondary | ICD-10-CM | POA: Diagnosis not present

## 2020-03-17 DIAGNOSIS — N138 Other obstructive and reflux uropathy: Secondary | ICD-10-CM | POA: Diagnosis not present

## 2020-03-17 DIAGNOSIS — S72002D Fracture of unspecified part of neck of left femur, subsequent encounter for closed fracture with routine healing: Secondary | ICD-10-CM | POA: Diagnosis not present

## 2020-03-17 DIAGNOSIS — I2510 Atherosclerotic heart disease of native coronary artery without angina pectoris: Secondary | ICD-10-CM | POA: Diagnosis not present

## 2020-03-17 DIAGNOSIS — R39198 Other difficulties with micturition: Secondary | ICD-10-CM | POA: Diagnosis not present

## 2020-03-17 DIAGNOSIS — R339 Retention of urine, unspecified: Secondary | ICD-10-CM | POA: Diagnosis not present

## 2020-03-17 DIAGNOSIS — N2889 Other specified disorders of kidney and ureter: Secondary | ICD-10-CM | POA: Diagnosis not present

## 2020-03-17 DIAGNOSIS — I480 Paroxysmal atrial fibrillation: Secondary | ICD-10-CM | POA: Diagnosis not present

## 2020-03-17 DIAGNOSIS — I120 Hypertensive chronic kidney disease with stage 5 chronic kidney disease or end stage renal disease: Secondary | ICD-10-CM | POA: Diagnosis not present

## 2020-03-17 LAB — BLADDER SCAN AMB NON-IMAGING: Scan Result: 200

## 2020-03-17 MED ORDER — SULFAMETHOXAZOLE-TRIMETHOPRIM 800-160 MG PO TABS: 1.0000 | ORAL_TABLET | Freq: Two times a day (BID) | ORAL | 0 refills | Status: DC

## 2020-03-17 MED ORDER — TAMSULOSIN HCL 0.4 MG PO CAPS: 0.4000 mg | ORAL_CAPSULE | Freq: Every day | ORAL | 3 refills | Status: DC

## 2020-03-17 MED ORDER — MIRABEGRON ER 25 MG PO TB24: 25.0000 mg | ORAL_TABLET | Freq: Every day | ORAL | 11 refills | Status: DC

## 2020-03-17 NOTE — Patient Instructions (Signed)

## 2020-03-17 NOTE — Progress Notes (Signed)
PVR=200  Urological Symptom Review  Patient is experiencing the following symptoms: Hard to postpone urination Burning/pain with urination Trouble starting stream Blood in urine Urinary tract infection Injury to kidneys/bladder Erection problems (male only)  Pt is on dialysis   Review of Systems  Gastrointestinal (upper)  : Negative for upper GI symptoms  Gastrointestinal (lower) : Constipation  Constitutional : Weight loss Fatigue  Skin: Negative for skin symptoms  Eyes: Negative for eye symptoms  Ear/Nose/Throat : Negative for Ear/Nose/Throat symptoms  Hematologic/Lymphatic: Negative for Hematologic/Lymphatic symptoms  Cardiovascular : Negative for cardiovascular symptoms  Respiratory : Cough  Endocrine: Negative for endocrine symptoms  Musculoskeletal: Back pain  Neurological: Dizziness  Psychologic: Depression Anxiety]

## 2020-03-17 NOTE — Progress Notes (Signed)
03/17/2020 10:50 AM   [name] 08-Aug-1950 [id number]  Referring provider: Manon Hilding, MD [address],  [place] [address]  Followup BPH and urinary urgency  HPI: Mr Bunte is a 69yo here for followup for BPH and urinary urgency. Last visit he was started on on mirabegron 25mg . His urgency and frequency has improved. Nocturia 1x. He has urinary urgency twice per day. He does have hesitancy. Stream strong. He has dysuria with each urination.    PMH: Past Medical History:  Diagnosis Date  . AAA (abdominal aortic aneurysm) ([facility]) 06/2016   Mild increase in size of 3.4 cm infrarenal abdominal aortic aneurysm  . Anemia   . Aortic atherosclerosis ([facility])   . Asthma   . Atrial flutter ([facility])   . AVF (arteriovenous fistula) ([facility])    Left forearm  . CAD (coronary artery disease)    a. s/p prior stenting of RCA in 1999 b. cath in 2003 showing moderate CAD c. NST in 2016 showing small area of ischemic and low-risk  . CHF (congestive heart failure) ([facility])   . CKD (chronic kidney disease), stage V ([facility])    kidney transplant evaluation  . COPD (chronic obstructive pulmonary disease) ([facility])    with ongoing tobacco use and patient failed sham takes  . Diverticulosis 2018   Mild sigmoid colon diverticulosis.  Marland Kitchen DM2 (diabetes mellitus, type 2) ([facility])   . Dyslipidemia   . Dysrhythmia 2018   atrial fibrillation  . Edema    chronic lower extremity secondary to right heart failure and chronic venous insufficiency  . Fatigue   . Fatty liver 2010   Mild  . Headache   . HTN (hypertension)   . Morbid obesity ([facility])   . Multiple pulmonary nodules 05/2018   Bilateral  . Pneumonia   . Presence of permanent cardiac pacemaker   . 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   Marland Kitchen Reflux esophagitis    hx  . Sleep apnea    PT STATES ONE TEST WAS POSITIVE AND ANOTHER TEST WAS NEGATIVE  . Two-vessel coronary artery disease    moderate. by  cath in 2003. Status post stenting of the mdi RCA November 1999 normal left ventricular ejection fraction  . Wears glasses   . Wears hearing aid    B/L    Surgical History: Past Surgical History:  Procedure Laterality Date  . A/V FISTULAGRAM Left 04/30/2019   Procedure: A/V FISTULAGRAM;  Surgeon: Elam Dutch, MD;  Location: [facility];  Service: Cardiovascular;  Laterality: Left;  . A/V FISTULAGRAM Left 09/22/2019   Procedure: A/V FISTULAGRAM;  Surgeon: Marty Heck, MD;  Location: [facility];  Service: Cardiovascular;  Laterality: Left;  . ABDOMINAL SURGERY    . APPENDECTOMY    . AV FISTULA PLACEMENT Right 03/11/2018   Procedure: CREATION Brachiocephalic Fistula RIGHT ARM;  Surgeon: Rosetta Posner, MD;  Location: [facility];  Service: Vascular;  Laterality: Right;  . AV FISTULA PLACEMENT Left 05/06/2019   Procedure: LEFT BRACHIOCEPHALIC ARTERIOVENOUS (AV) FISTULA CREATION;  Surgeon: Marty Heck, MD;  Location: [facility];  Service: Vascular;  Laterality: Left;  . BACK SURGERY     x3; cages in patient's back since 2000  . BIOPSY  11/12/2018   Procedure: BIOPSY;  Surgeon: Laurence Spates, MD;  Location: [facility];  Service: Endoscopy;;  . CARDIAC CATHETERIZATION     stent  . CHOLECYSTECTOMY    .  CLOSED REDUCTION SHOULDER DISLOCATION    . COLONOSCOPY W/ BIOPSIES AND POLYPECTOMY    . COLONOSCOPY WITH PROPOFOL N/A 11/12/2018   Procedure: COLONOSCOPY WITH PROPOFOL;  Surgeon: Laurence Spates, MD;  Location: [facility];  Service: Endoscopy;  Laterality: N/A;  . CORONARY ANGIOPLASTY    . diabetic ulcers    . DIAGNOSTIC LAPAROSCOPY    . DIALYSIS/PERMA CATHETER REMOVAL N/A 09/22/2019   Procedure: DIALYSIS/PERMA CATHETER REMOVAL;  Surgeon: Marty Heck, MD;  Location: [facility];  Service: Cardiovascular;  Laterality: N/A;  . ESOPHAGOGASTRODUODENOSCOPY (EGD) WITH PROPOFOL N/A 11/12/2018   Procedure: ESOPHAGOGASTRODUODENOSCOPY (EGD) WITH PROPOFOL;   Surgeon: Laurence Spates, MD;  [facility]: [facility];  Service: Endoscopy;  Laterality: N/A;  . GROIN EXPLORATION    . HEMOSTASIS CLIP PLACEMENT  11/12/2018   Procedure: HEMOSTASIS CLIP PLACEMENT;  Surgeon: Laurence Spates, MD;  [facility]: [facility];  Service: Endoscopy;;  . INSERT / REPLACE / REMOVE PACEMAKER    . INSERTION OF ARTERIOVENOUS (AV) ARTEGRAFT ARM Left 03/18/2018   Procedure: INSERTION OF ARTERIOVENOUS (AV) FISTULA;  Surgeon: Rosetta Posner, MD;  Location: [facility];  Service: Vascular;  Laterality: Left;  . IR FLUORO GUIDE CV LINE RIGHT  04/11/2019  . IR US GUIDE VASC ACCESS RIGHT  04/11/2019  . LEFT HEART CATH AND CORONARY ANGIOGRAPHY N/A 07/23/2019   Procedure: LEFT HEART CATH AND CORONARY ANGIOGRAPHY;  Surgeon: Martinique, Peter M, MD;  Location: [facility];  Service: Cardiovascular;  Laterality: N/A;  . LIGATION ARTERIOVENOUS GORTEX GRAFT Right 03/18/2018   Procedure: LIGATION ARTERIOVENOUS FISTULA;  Surgeon: Rosetta Posner, MD;  Location: [facility];  Service: Vascular;  Laterality: Right;  . OSTECTOMY Left 04/23/2017   Procedure: OSTECTOMY LEFT GREAT TOE;  Surgeon: [name], DPM;  Location: [facility];  Service: Podiatry;  Laterality: Left;  left great toe  . POLYPECTOMY  11/12/2018   Procedure: POLYPECTOMY;  Surgeon: Laurence Spates, MD;  Location: [facility];  Service: Endoscopy;;  . TUMOR REMOVAL     small intestine x3  . VISCERAL ANGIOGRAPHY N/A 08/02/2019   Procedure: VISCERAL ANGIOGRAPHY;  Surgeon: Waynetta Sandy, MD;  Location: [facility];  Service: Cardiovascular;  Laterality: N/A;    Home Medications:  Allergies as of 03/17/2020   No Known Allergies     Medication List       Accurate as of March 17, 2020 10:50 AM. If you have any questions, ask your nurse or doctor.        acetaminophen 500 MG tablet Commonly known as: TYLENOL Take 1,000 mg by mouth in the morning and at bedtime.   alendronate 70 MG tablet Commonly known as:  FOSAMAX Take 70 mg by mouth once a week.   aspirin 81 MG EC tablet Take 1 tablet (81 mg total) by mouth daily.   atorvastatin 40 MG tablet Commonly known as: LIPITOR Take 1 tablet (40 mg total) by mouth daily.   Auryxia 1 GM 210 MG(Fe) tablet Generic drug: ferric citrate Take 210-420 mg by mouth. Take 2 tablets three times a day with meals and 1 tablet daily with snacks   baclofen 10 MG tablet Commonly known as: LIORESAL Take 10 mg by mouth 3 (three) times daily as needed.   beclomethasone 80 MCG/ACT inhaler Commonly known as: QVAR Inhale 2 puffs into the lungs 2 (two) times daily as needed (shortness of breath).   bumetanide 2 MG tablet Commonly known as: BUMEX Take 2 mg by mouth 2 (two) times daily.  buPROPion 150 MG 12 hr tablet Commonly known as: WELLBUTRIN SR Take 150 mg by mouth 2 (two) times daily.   carvedilol 25 MG tablet Commonly known as: COREG Take 1 tablet (25 mg total) by mouth See admin instructions. Take 25 mg twice daily on Sun, Mon, Wed, and Fri. Take 25 mg in the evening on Tues, Thurs, and Sat (dialysis days)   diltiazem 30 MG tablet Commonly known as: CARDIZEM TAKE ONE TABLET BY MOUTH TWICE DAILY. What changed: additional instructions   ibuprofen 200 MG tablet Commonly known as: ADVIL Take 400 mg by mouth every 6 (six) hours as needed for mild pain or moderate pain.   insulin glargine 100 UNIT/ML injection Commonly known as: LANTUS Inject into the skin daily.   lidocaine 5 % Commonly known as: LIDODERM Place 1 patch onto the skin daily. Remove & Discard patch within 12 hours or as directed by MD   Linzess 290 MCG Caps capsule Generic drug: linaclotide Take 290 mcg by mouth daily as needed (constipation).   Melatonin 10 MG Tabs Take 10 mg by mouth at bedtime as needed (sleep).   mirabegron ER 25 MG Tb24 tablet Commonly known as: MYRBETRIQ Take 1 tablet (25 mg total) by mouth daily.   morphine 15 MG tablet Commonly known as:  MSIR Take 0.5 tablets (7.5 mg total) by mouth every 4 (four) hours as needed for severe pain.   multivitamin Tabs tablet Take 1 tablet by mouth daily.   nicotine 7 mg/24hr patch Commonly known as: NICODERM CQ - dosed in mg/24 hr Place 7 mg onto the skin daily.   nitroGLYCERIN 0.4 MG SL tablet Commonly known as: NITROSTAT Place 1 tablet (0.4 mg total) under the tongue every 5 (five) minutes as needed for chest pain.   omeprazole 20 MG capsule Commonly known as: PRILOSEC Take 20 mg by mouth daily.   polyethylene glycol 17 g packet Commonly known as: MIRALAX / GLYCOLAX Take 17 g by mouth daily as needed for mild constipation. Mix in morning coffee   risperiDONE 0.5 MG tablet Commonly known as: RISPERDAL Take 0.5 mg by mouth at bedtime.   Sennosides 25 MG Tabs Take 50 mg by mouth daily as needed (constipation).   silver sulfADIAZINE 1 % cream Commonly known as: SILVADENE Apply 1 application topically daily as needed (For wound care with dressing).   tamsulosin 0.4 MG Caps capsule Commonly known as: FLOMAX Take 1 capsule (0.4 mg total) by mouth daily after supper.   tiZANidine 2 MG tablet Commonly known as: ZANAFLEX Take 2 mg by mouth at bedtime.   traMADol 50 MG tablet Commonly known as: ULTRAM Take by mouth every 6 (six) hours as needed.   Ventolin HFA 108 (90 Base) MCG/ACT inhaler Generic drug: albuterol Inhale 2 puffs into the lungs every 6 (six) hours as needed for wheezing or shortness of breath.   Vitamin D3 50 MCG (2000 UT) Tabs Take 2,000 Units by mouth daily.       Allergies: No Known Allergies  Family History: Family History  Problem Relation Age of Onset  . Diabetes Mother   . Alcohol abuse Father     Social History:  reports that he quit smoking about 3 months ago. His smoking use included cigarettes. He started smoking about 51 years ago. He has a 40.00 pack-year smoking history. He has never used smokeless tobacco. He reports previous alcohol  use. He reports that he does not use drugs.  ROS: All other review of systems were  reviewed and are negative except what is noted above in HPI  Physical Exam: BP (!) 96/56   Pulse 62   Temp 97.8 F (36.6 C)   Ht 6\' 2"  (1.88 m)   Wt 203 lb (92.1 kg)   BMI 26.06 kg/m   Constitutional:  Alert and oriented, No acute distress. HEENT: [place] AT, moist mucus membranes.  Trachea midline, no masses. Cardiovascular: No clubbing, cyanosis, or edema. Respiratory: Normal respiratory effort, no increased work of breathing. GI: Abdomen is soft, nontender, nondistended, no abdominal masses GU: No CVA tenderness.  Lymph: No cervical or inguinal lymphadenopathy. Skin: No rashes, bruises or suspicious lesions. Neurologic: Grossly intact, no focal deficits, moving all 4 extremities. Psychiatric: Normal mood and affect.  Laboratory Data: Lab Results  Component Value Date   WBC 7.2 11/23/2019   HGB 14.4 11/23/2019   HCT 43.9 11/23/2019   MCV 101.4 (H) 11/23/2019   PLT 193 11/23/2019    Lab Results  Component Value Date   CREATININE 4.72 (H) 11/23/2019    No results found for: PSA  Lab Results  Component Value Date   TESTOSTERONE 297 07/28/2015    Lab Results  Component Value Date   HGBA1C 5.8 (H) 04/14/2017    Urinalysis    Component Value Date/Time   COLORURINE YELLOW 07/22/2019 1625   APPEARANCEUR Clear 02/16/2020 1351   LABSPEC 1.010 07/22/2019 1625   PHURINE 6.0 07/22/2019 1625   GLUCOSEU Negative 02/16/2020 1351   HGBUR NEGATIVE 07/22/2019 1625   BILIRUBINUR Negative 02/16/2020 1351   KETONESUR NEGATIVE 07/22/2019 1625   PROTEINUR 3+ (A) 02/16/2020 1351   PROTEINUR >=300 (A) 07/22/2019 1625   UROBILINOGEN 0.2 09/27/2014 0027   NITRITE Negative 02/16/2020 1351   NITRITE NEGATIVE 07/22/2019 1625   LEUKOCYTESUR Negative 02/16/2020 1351   LEUKOCYTESUR NEGATIVE 07/22/2019 1625    Lab Results  Component Value Date   LABMICR See below: 02/16/2020   WBCUA None seen  02/16/2020   LABEPIT None seen 02/16/2020   MUCUS Present 02/16/2020   BACTERIA None seen 02/16/2020    Pertinent Imaging:  No results found for this or any previous visit.  No results found for this or any previous visit.  No results found for this or any previous visit.  No results found for this or any previous visit.  Results for orders placed during the hospital encounter of 04/04/16  US Renal  Narrative CLINICAL DATA:  Hematuria.  EXAM: RENAL / URINARY TRACT ULTRASOUND COMPLETE  COMPARISON:  09/13/2015  FINDINGS: Right Kidney:  Length: 13.0 cm. Echogenicity within normal limits. No mass or hydronephrosis visualized.  Left Kidney:  Length: 13.8 cm. Echogenicity is normal. Suspect mild renal parenchymal thinning. In the mid aspect of the left renal pelvis and may represent a vascular calcification or intrarenal stone.  Bladder:  Appears normal for degree of bladder distention.  IMPRESSION: 1. No hydronephrosis. 2. Vascular calcification versus intrarenal calculus in the mid pole region of the left kidney. 3. Mild renal parenchymal thinning of the left kidney.   Electronically Signed By: Nolon Nations M.D. On: 04/05/2016 15:42  No results found for this or any previous visit.  No results found for this or any previous visit.  No results found for this or any previous visit.   Assessment & Plan:    1. BPH with urinary urgency -Continue mirabegron 25mg  daily - Urinalysis, Routine w reflex microscopic - BLADDER SCAN AMB NON-IMAGING  2. Difficulty urinating/incomplete emptying -Continue flomax 0.4mg  daily   No follow-ups  on file.  Nicolette Bang, MD  [facility] [place]

## 2020-03-18 DIAGNOSIS — Z23 Encounter for immunization: Secondary | ICD-10-CM | POA: Diagnosis not present

## 2020-03-18 DIAGNOSIS — D509 Iron deficiency anemia, unspecified: Secondary | ICD-10-CM | POA: Diagnosis not present

## 2020-03-18 DIAGNOSIS — R112 Nausea with vomiting, unspecified: Secondary | ICD-10-CM | POA: Diagnosis not present

## 2020-03-18 DIAGNOSIS — Z992 Dependence on renal dialysis: Secondary | ICD-10-CM | POA: Diagnosis not present

## 2020-03-18 DIAGNOSIS — N186 End stage renal disease: Secondary | ICD-10-CM | POA: Diagnosis not present

## 2020-03-18 DIAGNOSIS — D631 Anemia in chronic kidney disease: Secondary | ICD-10-CM | POA: Diagnosis not present

## 2020-03-19 DIAGNOSIS — N186 End stage renal disease: Secondary | ICD-10-CM | POA: Diagnosis not present

## 2020-03-20 DIAGNOSIS — I480 Paroxysmal atrial fibrillation: Secondary | ICD-10-CM | POA: Diagnosis not present

## 2020-03-20 DIAGNOSIS — S72002D Fracture of unspecified part of neck of left femur, subsequent encounter for closed fracture with routine healing: Secondary | ICD-10-CM | POA: Diagnosis not present

## 2020-03-20 DIAGNOSIS — J449 Chronic obstructive pulmonary disease, unspecified: Secondary | ICD-10-CM | POA: Diagnosis not present

## 2020-03-20 DIAGNOSIS — I120 Hypertensive chronic kidney disease with stage 5 chronic kidney disease or end stage renal disease: Secondary | ICD-10-CM | POA: Diagnosis not present

## 2020-03-20 DIAGNOSIS — N186 End stage renal disease: Secondary | ICD-10-CM | POA: Diagnosis not present

## 2020-03-20 DIAGNOSIS — E1122 Type 2 diabetes mellitus with diabetic chronic kidney disease: Secondary | ICD-10-CM | POA: Diagnosis not present

## 2020-03-21 ENCOUNTER — Other Ambulatory Visit: Payer: Self-pay

## 2020-03-21 ENCOUNTER — Telehealth: Payer: Self-pay

## 2020-03-21 DIAGNOSIS — Z992 Dependence on renal dialysis: Secondary | ICD-10-CM | POA: Diagnosis not present

## 2020-03-21 DIAGNOSIS — N2581 Secondary hyperparathyroidism of renal origin: Secondary | ICD-10-CM | POA: Diagnosis not present

## 2020-03-21 DIAGNOSIS — D509 Iron deficiency anemia, unspecified: Secondary | ICD-10-CM | POA: Diagnosis not present

## 2020-03-21 DIAGNOSIS — R110 Nausea: Secondary | ICD-10-CM | POA: Diagnosis not present

## 2020-03-21 DIAGNOSIS — N186 End stage renal disease: Secondary | ICD-10-CM | POA: Diagnosis not present

## 2020-03-21 DIAGNOSIS — R1110 Vomiting, unspecified: Secondary | ICD-10-CM | POA: Diagnosis not present

## 2020-03-21 DIAGNOSIS — R39198 Other difficulties with micturition: Secondary | ICD-10-CM

## 2020-03-21 MED ORDER — CEFPODOXIME PROXETIL 200 MG PO TABS: 200.0000 mg | ORAL_TABLET | Freq: Two times a day (BID) | ORAL | Status: DC

## 2020-03-21 MED ORDER — CEFPODOXIME PROXETIL 200 MG PO TABS: 200.0000 mg | ORAL_TABLET | Freq: Two times a day (BID) | ORAL | 0 refills | Status: DC

## 2020-03-21 NOTE — Progress Notes (Addendum)
Rx sent. Pt called and made aware.

## 2020-03-21 NOTE — Telephone Encounter (Signed)
Vantin 200mg  BID for 7 days

## 2020-03-21 NOTE — Addendum Note (Signed)
Addended byIris Pert on: 03/21/2020 05:21 PM   Modules accepted: Orders

## 2020-03-21 NOTE — Addendum Note (Signed)
Addended byIris Pert on: 03/21/2020 05:12 PM   Modules accepted: Orders

## 2020-03-22 DIAGNOSIS — I480 Paroxysmal atrial fibrillation: Secondary | ICD-10-CM | POA: Diagnosis not present

## 2020-03-22 DIAGNOSIS — S72002D Fracture of unspecified part of neck of left femur, subsequent encounter for closed fracture with routine healing: Secondary | ICD-10-CM | POA: Diagnosis not present

## 2020-03-22 DIAGNOSIS — I120 Hypertensive chronic kidney disease with stage 5 chronic kidney disease or end stage renal disease: Secondary | ICD-10-CM | POA: Diagnosis not present

## 2020-03-22 DIAGNOSIS — E1122 Type 2 diabetes mellitus with diabetic chronic kidney disease: Secondary | ICD-10-CM | POA: Diagnosis not present

## 2020-03-22 DIAGNOSIS — J449 Chronic obstructive pulmonary disease, unspecified: Secondary | ICD-10-CM | POA: Diagnosis not present

## 2020-03-22 DIAGNOSIS — N186 End stage renal disease: Secondary | ICD-10-CM | POA: Diagnosis not present

## 2020-03-23 DIAGNOSIS — N186 End stage renal disease: Secondary | ICD-10-CM | POA: Diagnosis not present

## 2020-03-23 DIAGNOSIS — Z992 Dependence on renal dialysis: Secondary | ICD-10-CM | POA: Diagnosis not present

## 2020-03-23 DIAGNOSIS — N2581 Secondary hyperparathyroidism of renal origin: Secondary | ICD-10-CM | POA: Diagnosis not present

## 2020-03-23 DIAGNOSIS — R110 Nausea: Secondary | ICD-10-CM | POA: Diagnosis not present

## 2020-03-23 DIAGNOSIS — R1110 Vomiting, unspecified: Secondary | ICD-10-CM | POA: Diagnosis not present

## 2020-03-23 DIAGNOSIS — D509 Iron deficiency anemia, unspecified: Secondary | ICD-10-CM | POA: Diagnosis not present

## 2020-03-25 DIAGNOSIS — N186 End stage renal disease: Secondary | ICD-10-CM | POA: Diagnosis not present

## 2020-03-25 DIAGNOSIS — R1110 Vomiting, unspecified: Secondary | ICD-10-CM | POA: Diagnosis not present

## 2020-03-25 DIAGNOSIS — N2581 Secondary hyperparathyroidism of renal origin: Secondary | ICD-10-CM | POA: Diagnosis not present

## 2020-03-25 DIAGNOSIS — R110 Nausea: Secondary | ICD-10-CM | POA: Diagnosis not present

## 2020-03-25 DIAGNOSIS — Z992 Dependence on renal dialysis: Secondary | ICD-10-CM | POA: Diagnosis not present

## 2020-03-25 DIAGNOSIS — D509 Iron deficiency anemia, unspecified: Secondary | ICD-10-CM | POA: Diagnosis not present

## 2020-03-27 DIAGNOSIS — N186 End stage renal disease: Secondary | ICD-10-CM | POA: Diagnosis not present

## 2020-03-27 DIAGNOSIS — I120 Hypertensive chronic kidney disease with stage 5 chronic kidney disease or end stage renal disease: Secondary | ICD-10-CM | POA: Diagnosis not present

## 2020-03-27 DIAGNOSIS — E1122 Type 2 diabetes mellitus with diabetic chronic kidney disease: Secondary | ICD-10-CM | POA: Diagnosis not present

## 2020-03-27 DIAGNOSIS — S72002D Fracture of unspecified part of neck of left femur, subsequent encounter for closed fracture with routine healing: Secondary | ICD-10-CM | POA: Diagnosis not present

## 2020-03-27 DIAGNOSIS — I480 Paroxysmal atrial fibrillation: Secondary | ICD-10-CM | POA: Diagnosis not present

## 2020-03-27 DIAGNOSIS — J449 Chronic obstructive pulmonary disease, unspecified: Secondary | ICD-10-CM | POA: Diagnosis not present

## 2020-03-28 DIAGNOSIS — D509 Iron deficiency anemia, unspecified: Secondary | ICD-10-CM | POA: Diagnosis not present

## 2020-03-28 DIAGNOSIS — R110 Nausea: Secondary | ICD-10-CM | POA: Diagnosis not present

## 2020-03-28 DIAGNOSIS — N2581 Secondary hyperparathyroidism of renal origin: Secondary | ICD-10-CM | POA: Diagnosis not present

## 2020-03-28 DIAGNOSIS — Z992 Dependence on renal dialysis: Secondary | ICD-10-CM | POA: Diagnosis not present

## 2020-03-28 DIAGNOSIS — R1110 Vomiting, unspecified: Secondary | ICD-10-CM | POA: Diagnosis not present

## 2020-03-28 DIAGNOSIS — N186 End stage renal disease: Secondary | ICD-10-CM | POA: Diagnosis not present

## 2020-03-29 DIAGNOSIS — I480 Paroxysmal atrial fibrillation: Secondary | ICD-10-CM | POA: Diagnosis not present

## 2020-03-29 DIAGNOSIS — E1122 Type 2 diabetes mellitus with diabetic chronic kidney disease: Secondary | ICD-10-CM | POA: Diagnosis not present

## 2020-03-29 DIAGNOSIS — N186 End stage renal disease: Secondary | ICD-10-CM | POA: Diagnosis not present

## 2020-03-29 DIAGNOSIS — S72002D Fracture of unspecified part of neck of left femur, subsequent encounter for closed fracture with routine healing: Secondary | ICD-10-CM | POA: Diagnosis not present

## 2020-03-29 DIAGNOSIS — I120 Hypertensive chronic kidney disease with stage 5 chronic kidney disease or end stage renal disease: Secondary | ICD-10-CM | POA: Diagnosis not present

## 2020-03-29 DIAGNOSIS — J449 Chronic obstructive pulmonary disease, unspecified: Secondary | ICD-10-CM | POA: Diagnosis not present

## 2020-03-30 DIAGNOSIS — Z992 Dependence on renal dialysis: Secondary | ICD-10-CM | POA: Diagnosis not present

## 2020-03-30 DIAGNOSIS — N2581 Secondary hyperparathyroidism of renal origin: Secondary | ICD-10-CM | POA: Diagnosis not present

## 2020-03-30 DIAGNOSIS — D509 Iron deficiency anemia, unspecified: Secondary | ICD-10-CM | POA: Diagnosis not present

## 2020-03-30 DIAGNOSIS — R1110 Vomiting, unspecified: Secondary | ICD-10-CM | POA: Diagnosis not present

## 2020-03-30 DIAGNOSIS — N186 End stage renal disease: Secondary | ICD-10-CM | POA: Diagnosis not present

## 2020-03-30 DIAGNOSIS — R110 Nausea: Secondary | ICD-10-CM | POA: Diagnosis not present

## 2020-04-01 DIAGNOSIS — R110 Nausea: Secondary | ICD-10-CM | POA: Diagnosis not present

## 2020-04-01 DIAGNOSIS — R1110 Vomiting, unspecified: Secondary | ICD-10-CM | POA: Diagnosis not present

## 2020-04-01 DIAGNOSIS — N2581 Secondary hyperparathyroidism of renal origin: Secondary | ICD-10-CM | POA: Diagnosis not present

## 2020-04-01 DIAGNOSIS — D509 Iron deficiency anemia, unspecified: Secondary | ICD-10-CM | POA: Diagnosis not present

## 2020-04-01 DIAGNOSIS — N186 End stage renal disease: Secondary | ICD-10-CM | POA: Diagnosis not present

## 2020-04-01 DIAGNOSIS — Z992 Dependence on renal dialysis: Secondary | ICD-10-CM | POA: Diagnosis not present

## 2020-04-03 ENCOUNTER — Encounter: Payer: Medicare Other | Attending: Physical Medicine & Rehabilitation | Admitting: Physical Medicine & Rehabilitation

## 2020-04-03 ENCOUNTER — Other Ambulatory Visit: Payer: Self-pay

## 2020-04-03 ENCOUNTER — Encounter: Payer: Self-pay | Admitting: Physical Medicine & Rehabilitation

## 2020-04-03 VITALS — BP 113/63 | HR 60 | Temp 98.0°F | Ht 74.0 in | Wt 205.0 lb

## 2020-04-03 DIAGNOSIS — E1142 Type 2 diabetes mellitus with diabetic polyneuropathy: Secondary | ICD-10-CM | POA: Diagnosis not present

## 2020-04-03 NOTE — Progress Notes (Signed)
Please see media tab for full results.

## 2020-04-04 DIAGNOSIS — R1110 Vomiting, unspecified: Secondary | ICD-10-CM | POA: Diagnosis not present

## 2020-04-04 DIAGNOSIS — N186 End stage renal disease: Secondary | ICD-10-CM | POA: Diagnosis not present

## 2020-04-04 DIAGNOSIS — R110 Nausea: Secondary | ICD-10-CM | POA: Diagnosis not present

## 2020-04-04 DIAGNOSIS — Z992 Dependence on renal dialysis: Secondary | ICD-10-CM | POA: Diagnosis not present

## 2020-04-04 DIAGNOSIS — D509 Iron deficiency anemia, unspecified: Secondary | ICD-10-CM | POA: Diagnosis not present

## 2020-04-04 DIAGNOSIS — N2581 Secondary hyperparathyroidism of renal origin: Secondary | ICD-10-CM | POA: Diagnosis not present

## 2020-04-05 DIAGNOSIS — I120 Hypertensive chronic kidney disease with stage 5 chronic kidney disease or end stage renal disease: Secondary | ICD-10-CM | POA: Diagnosis not present

## 2020-04-05 DIAGNOSIS — N186 End stage renal disease: Secondary | ICD-10-CM | POA: Diagnosis not present

## 2020-04-05 DIAGNOSIS — S72002D Fracture of unspecified part of neck of left femur, subsequent encounter for closed fracture with routine healing: Secondary | ICD-10-CM | POA: Diagnosis not present

## 2020-04-05 DIAGNOSIS — E1122 Type 2 diabetes mellitus with diabetic chronic kidney disease: Secondary | ICD-10-CM | POA: Diagnosis not present

## 2020-04-05 DIAGNOSIS — I480 Paroxysmal atrial fibrillation: Secondary | ICD-10-CM | POA: Diagnosis not present

## 2020-04-05 DIAGNOSIS — J449 Chronic obstructive pulmonary disease, unspecified: Secondary | ICD-10-CM | POA: Diagnosis not present

## 2020-04-06 ENCOUNTER — Telehealth: Payer: Self-pay

## 2020-04-06 DIAGNOSIS — N186 End stage renal disease: Secondary | ICD-10-CM | POA: Diagnosis not present

## 2020-04-06 DIAGNOSIS — N2581 Secondary hyperparathyroidism of renal origin: Secondary | ICD-10-CM | POA: Diagnosis not present

## 2020-04-06 DIAGNOSIS — Z992 Dependence on renal dialysis: Secondary | ICD-10-CM | POA: Diagnosis not present

## 2020-04-06 DIAGNOSIS — D509 Iron deficiency anemia, unspecified: Secondary | ICD-10-CM | POA: Diagnosis not present

## 2020-04-06 DIAGNOSIS — R1110 Vomiting, unspecified: Secondary | ICD-10-CM | POA: Diagnosis not present

## 2020-04-06 DIAGNOSIS — R110 Nausea: Secondary | ICD-10-CM | POA: Diagnosis not present

## 2020-04-06 NOTE — Telephone Encounter (Signed)
Pts wife notified per Dr. Alyson Ingles to leave a pts urine specimen for analysis.

## 2020-04-07 DIAGNOSIS — N401 Enlarged prostate with lower urinary tract symptoms: Secondary | ICD-10-CM | POA: Diagnosis not present

## 2020-04-07 DIAGNOSIS — Z950 Presence of cardiac pacemaker: Secondary | ICD-10-CM | POA: Diagnosis not present

## 2020-04-07 DIAGNOSIS — I495 Sick sinus syndrome: Secondary | ICD-10-CM | POA: Diagnosis not present

## 2020-04-07 DIAGNOSIS — M48061 Spinal stenosis, lumbar region without neurogenic claudication: Secondary | ICD-10-CM | POA: Diagnosis not present

## 2020-04-07 DIAGNOSIS — Z992 Dependence on renal dialysis: Secondary | ICD-10-CM | POA: Diagnosis not present

## 2020-04-07 DIAGNOSIS — K5909 Other constipation: Secondary | ICD-10-CM | POA: Diagnosis not present

## 2020-04-07 DIAGNOSIS — F349 Persistent mood [affective] disorder, unspecified: Secondary | ICD-10-CM | POA: Diagnosis not present

## 2020-04-07 DIAGNOSIS — I120 Hypertensive chronic kidney disease with stage 5 chronic kidney disease or end stage renal disease: Secondary | ICD-10-CM | POA: Diagnosis not present

## 2020-04-07 DIAGNOSIS — D631 Anemia in chronic kidney disease: Secondary | ICD-10-CM | POA: Diagnosis not present

## 2020-04-07 DIAGNOSIS — Z9181 History of falling: Secondary | ICD-10-CM | POA: Diagnosis not present

## 2020-04-07 DIAGNOSIS — S72002D Fracture of unspecified part of neck of left femur, subsequent encounter for closed fracture with routine healing: Secondary | ICD-10-CM | POA: Diagnosis not present

## 2020-04-07 DIAGNOSIS — J449 Chronic obstructive pulmonary disease, unspecified: Secondary | ICD-10-CM | POA: Diagnosis not present

## 2020-04-07 DIAGNOSIS — Z7982 Long term (current) use of aspirin: Secondary | ICD-10-CM | POA: Diagnosis not present

## 2020-04-07 DIAGNOSIS — F17210 Nicotine dependence, cigarettes, uncomplicated: Secondary | ICD-10-CM | POA: Diagnosis not present

## 2020-04-07 DIAGNOSIS — N186 End stage renal disease: Secondary | ICD-10-CM | POA: Diagnosis not present

## 2020-04-07 DIAGNOSIS — E782 Mixed hyperlipidemia: Secondary | ICD-10-CM | POA: Diagnosis not present

## 2020-04-07 DIAGNOSIS — I731 Thromboangiitis obliterans [Buerger's disease]: Secondary | ICD-10-CM | POA: Diagnosis not present

## 2020-04-07 DIAGNOSIS — I480 Paroxysmal atrial fibrillation: Secondary | ICD-10-CM | POA: Diagnosis not present

## 2020-04-07 DIAGNOSIS — E1122 Type 2 diabetes mellitus with diabetic chronic kidney disease: Secondary | ICD-10-CM | POA: Diagnosis not present

## 2020-04-07 DIAGNOSIS — I2510 Atherosclerotic heart disease of native coronary artery without angina pectoris: Secondary | ICD-10-CM | POA: Diagnosis not present

## 2020-04-08 DIAGNOSIS — R1110 Vomiting, unspecified: Secondary | ICD-10-CM | POA: Diagnosis not present

## 2020-04-08 DIAGNOSIS — R110 Nausea: Secondary | ICD-10-CM | POA: Diagnosis not present

## 2020-04-08 DIAGNOSIS — N2581 Secondary hyperparathyroidism of renal origin: Secondary | ICD-10-CM | POA: Diagnosis not present

## 2020-04-08 DIAGNOSIS — Z992 Dependence on renal dialysis: Secondary | ICD-10-CM | POA: Diagnosis not present

## 2020-04-08 DIAGNOSIS — D509 Iron deficiency anemia, unspecified: Secondary | ICD-10-CM | POA: Diagnosis not present

## 2020-04-08 DIAGNOSIS — N186 End stage renal disease: Secondary | ICD-10-CM | POA: Diagnosis not present

## 2020-04-10 ENCOUNTER — Other Ambulatory Visit: Payer: Self-pay

## 2020-04-10 DIAGNOSIS — I480 Paroxysmal atrial fibrillation: Secondary | ICD-10-CM | POA: Diagnosis not present

## 2020-04-10 DIAGNOSIS — R339 Retention of urine, unspecified: Secondary | ICD-10-CM

## 2020-04-10 DIAGNOSIS — I120 Hypertensive chronic kidney disease with stage 5 chronic kidney disease or end stage renal disease: Secondary | ICD-10-CM | POA: Diagnosis not present

## 2020-04-10 DIAGNOSIS — E1122 Type 2 diabetes mellitus with diabetic chronic kidney disease: Secondary | ICD-10-CM | POA: Diagnosis not present

## 2020-04-10 DIAGNOSIS — J449 Chronic obstructive pulmonary disease, unspecified: Secondary | ICD-10-CM | POA: Diagnosis not present

## 2020-04-10 DIAGNOSIS — N186 End stage renal disease: Secondary | ICD-10-CM | POA: Diagnosis not present

## 2020-04-10 DIAGNOSIS — S72002D Fracture of unspecified part of neck of left femur, subsequent encounter for closed fracture with routine healing: Secondary | ICD-10-CM | POA: Diagnosis not present

## 2020-04-10 LAB — MICROSCOPIC EXAMINATION
Epithelial Cells (non renal): NONE SEEN /hpf (ref 0–10)
RBC, Urine: NONE SEEN /hpf (ref 0–2)
WBC, UA: >30 /hpf — ABNORMAL HIGH (ref 0–5)

## 2020-04-10 LAB — URINALYSIS, ROUTINE W REFLEX MICROSCOPIC
Bilirubin, UA: NEGATIVE
Glucose, UA: NEGATIVE
Ketones, UA: NEGATIVE
Nitrite, UA: NEGATIVE
Specific Gravity, UA: 1.015 (ref 1.005–1.030)
Urobilinogen, Ur: 0.2 mg/dL (ref 0.2–1.0)
pH, UA: 6 (ref 5.0–7.5)

## 2020-04-11 DIAGNOSIS — D509 Iron deficiency anemia, unspecified: Secondary | ICD-10-CM | POA: Diagnosis not present

## 2020-04-11 DIAGNOSIS — R110 Nausea: Secondary | ICD-10-CM | POA: Diagnosis not present

## 2020-04-11 DIAGNOSIS — R109 Unspecified abdominal pain: Secondary | ICD-10-CM | POA: Diagnosis not present

## 2020-04-11 DIAGNOSIS — N2581 Secondary hyperparathyroidism of renal origin: Secondary | ICD-10-CM | POA: Diagnosis not present

## 2020-04-11 DIAGNOSIS — N186 End stage renal disease: Secondary | ICD-10-CM | POA: Diagnosis not present

## 2020-04-11 DIAGNOSIS — Z992 Dependence on renal dialysis: Secondary | ICD-10-CM | POA: Diagnosis not present

## 2020-04-11 DIAGNOSIS — R1110 Vomiting, unspecified: Secondary | ICD-10-CM | POA: Diagnosis not present

## 2020-04-11 DIAGNOSIS — E119 Type 2 diabetes mellitus without complications: Secondary | ICD-10-CM | POA: Diagnosis not present

## 2020-04-12 DIAGNOSIS — M48062 Spinal stenosis, lumbar region with neurogenic claudication: Secondary | ICD-10-CM | POA: Diagnosis not present

## 2020-04-12 DIAGNOSIS — M542 Cervicalgia: Secondary | ICD-10-CM | POA: Diagnosis not present

## 2020-04-12 DIAGNOSIS — S32029S Unspecified fracture of second lumbar vertebra, sequela: Secondary | ICD-10-CM | POA: Diagnosis not present

## 2020-04-13 DIAGNOSIS — R110 Nausea: Secondary | ICD-10-CM | POA: Diagnosis not present

## 2020-04-13 DIAGNOSIS — Z992 Dependence on renal dialysis: Secondary | ICD-10-CM | POA: Diagnosis not present

## 2020-04-13 DIAGNOSIS — N186 End stage renal disease: Secondary | ICD-10-CM | POA: Diagnosis not present

## 2020-04-13 DIAGNOSIS — R1110 Vomiting, unspecified: Secondary | ICD-10-CM | POA: Diagnosis not present

## 2020-04-13 DIAGNOSIS — N2581 Secondary hyperparathyroidism of renal origin: Secondary | ICD-10-CM | POA: Diagnosis not present

## 2020-04-13 DIAGNOSIS — D509 Iron deficiency anemia, unspecified: Secondary | ICD-10-CM | POA: Diagnosis not present

## 2020-04-15 DIAGNOSIS — R1110 Vomiting, unspecified: Secondary | ICD-10-CM | POA: Diagnosis not present

## 2020-04-15 DIAGNOSIS — Z992 Dependence on renal dialysis: Secondary | ICD-10-CM | POA: Diagnosis not present

## 2020-04-15 DIAGNOSIS — N186 End stage renal disease: Secondary | ICD-10-CM | POA: Diagnosis not present

## 2020-04-15 DIAGNOSIS — D509 Iron deficiency anemia, unspecified: Secondary | ICD-10-CM | POA: Diagnosis not present

## 2020-04-15 DIAGNOSIS — R110 Nausea: Secondary | ICD-10-CM | POA: Diagnosis not present

## 2020-04-15 DIAGNOSIS — N2581 Secondary hyperparathyroidism of renal origin: Secondary | ICD-10-CM | POA: Diagnosis not present

## 2020-04-17 ENCOUNTER — Telehealth: Payer: Self-pay

## 2020-04-17 DIAGNOSIS — N186 End stage renal disease: Secondary | ICD-10-CM | POA: Diagnosis not present

## 2020-04-17 DIAGNOSIS — J449 Chronic obstructive pulmonary disease, unspecified: Secondary | ICD-10-CM | POA: Diagnosis not present

## 2020-04-17 DIAGNOSIS — S72002D Fracture of unspecified part of neck of left femur, subsequent encounter for closed fracture with routine healing: Secondary | ICD-10-CM | POA: Diagnosis not present

## 2020-04-17 DIAGNOSIS — I120 Hypertensive chronic kidney disease with stage 5 chronic kidney disease or end stage renal disease: Secondary | ICD-10-CM | POA: Diagnosis not present

## 2020-04-17 DIAGNOSIS — I480 Paroxysmal atrial fibrillation: Secondary | ICD-10-CM | POA: Diagnosis not present

## 2020-04-17 DIAGNOSIS — E1122 Type 2 diabetes mellitus with diabetic chronic kidney disease: Secondary | ICD-10-CM | POA: Diagnosis not present

## 2020-04-18 ENCOUNTER — Telehealth: Payer: Self-pay

## 2020-04-18 ENCOUNTER — Other Ambulatory Visit: Payer: Self-pay

## 2020-04-18 DIAGNOSIS — R1110 Vomiting, unspecified: Secondary | ICD-10-CM | POA: Diagnosis not present

## 2020-04-18 DIAGNOSIS — I4891 Unspecified atrial fibrillation: Secondary | ICD-10-CM | POA: Diagnosis not present

## 2020-04-18 DIAGNOSIS — I1310 Hypertensive heart and chronic kidney disease without heart failure, with stage 1 through stage 4 chronic kidney disease, or unspecified chronic kidney disease: Secondary | ICD-10-CM | POA: Diagnosis not present

## 2020-04-18 DIAGNOSIS — I120 Hypertensive chronic kidney disease with stage 5 chronic kidney disease or end stage renal disease: Secondary | ICD-10-CM | POA: Diagnosis not present

## 2020-04-18 DIAGNOSIS — S72009A Fracture of unspecified part of neck of unspecified femur, initial encounter for closed fracture: Secondary | ICD-10-CM | POA: Diagnosis not present

## 2020-04-18 DIAGNOSIS — I119 Hypertensive heart disease without heart failure: Secondary | ICD-10-CM | POA: Diagnosis not present

## 2020-04-18 DIAGNOSIS — Z950 Presence of cardiac pacemaker: Secondary | ICD-10-CM | POA: Diagnosis not present

## 2020-04-18 DIAGNOSIS — I2510 Atherosclerotic heart disease of native coronary artery without angina pectoris: Secondary | ICD-10-CM | POA: Diagnosis not present

## 2020-04-18 DIAGNOSIS — N2581 Secondary hyperparathyroidism of renal origin: Secondary | ICD-10-CM | POA: Diagnosis not present

## 2020-04-18 DIAGNOSIS — I495 Sick sinus syndrome: Secondary | ICD-10-CM | POA: Diagnosis not present

## 2020-04-18 DIAGNOSIS — Z992 Dependence on renal dialysis: Secondary | ICD-10-CM | POA: Diagnosis not present

## 2020-04-18 DIAGNOSIS — R110 Nausea: Secondary | ICD-10-CM | POA: Diagnosis not present

## 2020-04-18 DIAGNOSIS — K922 Gastrointestinal hemorrhage, unspecified: Secondary | ICD-10-CM | POA: Diagnosis not present

## 2020-04-18 DIAGNOSIS — M810 Age-related osteoporosis without current pathological fracture: Secondary | ICD-10-CM | POA: Diagnosis not present

## 2020-04-18 DIAGNOSIS — D509 Iron deficiency anemia, unspecified: Secondary | ICD-10-CM | POA: Diagnosis not present

## 2020-04-18 DIAGNOSIS — N186 End stage renal disease: Secondary | ICD-10-CM | POA: Diagnosis not present

## 2020-04-18 MED ORDER — SOLIFENACIN SUCCINATE 5 MG PO TABS: 5.0000 mg | ORAL_TABLET | Freq: Every day | ORAL | 3 refills | Status: DC

## 2020-04-18 NOTE — Telephone Encounter (Signed)
Pts wife notified med sent in and that we need another  spec from pt. She will try to get sterile urine cup from dr in [place] and bring in. She is to call back if she can get one and bring to [place].

## 2020-04-18 NOTE — Telephone Encounter (Signed)
Pt's wife notified.

## 2020-04-18 NOTE — Telephone Encounter (Signed)
Please send vesicare 5mg  daiy #[phone]

## 2020-04-18 NOTE — Telephone Encounter (Signed)
Pts wife said another dr is going to test pts urine there to make it easier on them.

## 2020-04-18 NOTE — Telephone Encounter (Signed)
HE NEEDS TO DROP OFF ANOTHER URINE

## 2020-04-19 DIAGNOSIS — J449 Chronic obstructive pulmonary disease, unspecified: Secondary | ICD-10-CM | POA: Diagnosis not present

## 2020-04-19 DIAGNOSIS — E1122 Type 2 diabetes mellitus with diabetic chronic kidney disease: Secondary | ICD-10-CM | POA: Diagnosis not present

## 2020-04-19 DIAGNOSIS — I480 Paroxysmal atrial fibrillation: Secondary | ICD-10-CM | POA: Diagnosis not present

## 2020-04-19 DIAGNOSIS — I120 Hypertensive chronic kidney disease with stage 5 chronic kidney disease or end stage renal disease: Secondary | ICD-10-CM | POA: Diagnosis not present

## 2020-04-19 DIAGNOSIS — D509 Iron deficiency anemia, unspecified: Secondary | ICD-10-CM | POA: Diagnosis not present

## 2020-04-19 DIAGNOSIS — Z992 Dependence on renal dialysis: Secondary | ICD-10-CM | POA: Diagnosis not present

## 2020-04-19 DIAGNOSIS — N186 End stage renal disease: Secondary | ICD-10-CM | POA: Diagnosis not present

## 2020-04-19 DIAGNOSIS — S72002D Fracture of unspecified part of neck of left femur, subsequent encounter for closed fracture with routine healing: Secondary | ICD-10-CM | POA: Diagnosis not present

## 2020-04-20 DIAGNOSIS — Z992 Dependence on renal dialysis: Secondary | ICD-10-CM | POA: Diagnosis not present

## 2020-04-20 DIAGNOSIS — N186 End stage renal disease: Secondary | ICD-10-CM | POA: Diagnosis not present

## 2020-04-20 DIAGNOSIS — D509 Iron deficiency anemia, unspecified: Secondary | ICD-10-CM | POA: Diagnosis not present

## 2020-04-21 DIAGNOSIS — R300 Dysuria: Secondary | ICD-10-CM | POA: Diagnosis not present

## 2020-04-21 DIAGNOSIS — Z23 Encounter for immunization: Secondary | ICD-10-CM | POA: Diagnosis not present

## 2020-04-22 DIAGNOSIS — Z992 Dependence on renal dialysis: Secondary | ICD-10-CM | POA: Diagnosis not present

## 2020-04-22 DIAGNOSIS — N186 End stage renal disease: Secondary | ICD-10-CM | POA: Diagnosis not present

## 2020-04-22 DIAGNOSIS — D509 Iron deficiency anemia, unspecified: Secondary | ICD-10-CM | POA: Diagnosis not present

## 2020-04-24 DIAGNOSIS — I480 Paroxysmal atrial fibrillation: Secondary | ICD-10-CM | POA: Diagnosis not present

## 2020-04-24 DIAGNOSIS — S72002D Fracture of unspecified part of neck of left femur, subsequent encounter for closed fracture with routine healing: Secondary | ICD-10-CM | POA: Diagnosis not present

## 2020-04-24 DIAGNOSIS — I120 Hypertensive chronic kidney disease with stage 5 chronic kidney disease or end stage renal disease: Secondary | ICD-10-CM | POA: Diagnosis not present

## 2020-04-24 DIAGNOSIS — E1122 Type 2 diabetes mellitus with diabetic chronic kidney disease: Secondary | ICD-10-CM | POA: Diagnosis not present

## 2020-04-24 DIAGNOSIS — J449 Chronic obstructive pulmonary disease, unspecified: Secondary | ICD-10-CM | POA: Diagnosis not present

## 2020-04-24 DIAGNOSIS — N186 End stage renal disease: Secondary | ICD-10-CM | POA: Diagnosis not present

## 2020-04-25 DIAGNOSIS — N186 End stage renal disease: Secondary | ICD-10-CM | POA: Diagnosis not present

## 2020-04-25 DIAGNOSIS — Z992 Dependence on renal dialysis: Secondary | ICD-10-CM | POA: Diagnosis not present

## 2020-04-25 DIAGNOSIS — D509 Iron deficiency anemia, unspecified: Secondary | ICD-10-CM | POA: Diagnosis not present

## 2020-04-26 DIAGNOSIS — Z86010 Personal history of colonic polyps: Secondary | ICD-10-CM | POA: Diagnosis not present

## 2020-04-26 DIAGNOSIS — K5909 Other constipation: Secondary | ICD-10-CM | POA: Diagnosis not present

## 2020-04-27 DIAGNOSIS — D509 Iron deficiency anemia, unspecified: Secondary | ICD-10-CM | POA: Diagnosis not present

## 2020-04-27 DIAGNOSIS — N186 End stage renal disease: Secondary | ICD-10-CM | POA: Diagnosis not present

## 2020-04-27 DIAGNOSIS — Z992 Dependence on renal dialysis: Secondary | ICD-10-CM | POA: Diagnosis not present

## 2020-04-28 DIAGNOSIS — E1122 Type 2 diabetes mellitus with diabetic chronic kidney disease: Secondary | ICD-10-CM | POA: Diagnosis not present

## 2020-04-28 DIAGNOSIS — J449 Chronic obstructive pulmonary disease, unspecified: Secondary | ICD-10-CM | POA: Diagnosis not present

## 2020-04-28 DIAGNOSIS — I120 Hypertensive chronic kidney disease with stage 5 chronic kidney disease or end stage renal disease: Secondary | ICD-10-CM | POA: Diagnosis not present

## 2020-04-28 DIAGNOSIS — I480 Paroxysmal atrial fibrillation: Secondary | ICD-10-CM | POA: Diagnosis not present

## 2020-04-28 DIAGNOSIS — N186 End stage renal disease: Secondary | ICD-10-CM | POA: Diagnosis not present

## 2020-04-28 DIAGNOSIS — S72002D Fracture of unspecified part of neck of left femur, subsequent encounter for closed fracture with routine healing: Secondary | ICD-10-CM | POA: Diagnosis not present

## 2020-04-29 DIAGNOSIS — Z992 Dependence on renal dialysis: Secondary | ICD-10-CM | POA: Diagnosis not present

## 2020-04-29 DIAGNOSIS — D509 Iron deficiency anemia, unspecified: Secondary | ICD-10-CM | POA: Diagnosis not present

## 2020-04-29 DIAGNOSIS — N186 End stage renal disease: Secondary | ICD-10-CM | POA: Diagnosis not present

## 2020-05-02 DIAGNOSIS — S72002D Fracture of unspecified part of neck of left femur, subsequent encounter for closed fracture with routine healing: Secondary | ICD-10-CM | POA: Diagnosis not present

## 2020-05-02 DIAGNOSIS — D509 Iron deficiency anemia, unspecified: Secondary | ICD-10-CM | POA: Diagnosis not present

## 2020-05-02 DIAGNOSIS — J449 Chronic obstructive pulmonary disease, unspecified: Secondary | ICD-10-CM | POA: Diagnosis not present

## 2020-05-02 DIAGNOSIS — N186 End stage renal disease: Secondary | ICD-10-CM | POA: Diagnosis not present

## 2020-05-02 DIAGNOSIS — E1122 Type 2 diabetes mellitus with diabetic chronic kidney disease: Secondary | ICD-10-CM | POA: Diagnosis not present

## 2020-05-02 DIAGNOSIS — I120 Hypertensive chronic kidney disease with stage 5 chronic kidney disease or end stage renal disease: Secondary | ICD-10-CM | POA: Diagnosis not present

## 2020-05-02 DIAGNOSIS — I480 Paroxysmal atrial fibrillation: Secondary | ICD-10-CM | POA: Diagnosis not present

## 2020-05-02 DIAGNOSIS — Z992 Dependence on renal dialysis: Secondary | ICD-10-CM | POA: Diagnosis not present

## 2020-05-03 DIAGNOSIS — L218 Other seborrheic dermatitis: Secondary | ICD-10-CM | POA: Diagnosis not present

## 2020-05-03 DIAGNOSIS — Z85828 Personal history of other malignant neoplasm of skin: Secondary | ICD-10-CM | POA: Diagnosis not present

## 2020-05-03 DIAGNOSIS — Z08 Encounter for follow-up examination after completed treatment for malignant neoplasm: Secondary | ICD-10-CM | POA: Diagnosis not present

## 2020-05-04 DIAGNOSIS — Z992 Dependence on renal dialysis: Secondary | ICD-10-CM | POA: Diagnosis not present

## 2020-05-04 DIAGNOSIS — N186 End stage renal disease: Secondary | ICD-10-CM | POA: Diagnosis not present

## 2020-05-04 DIAGNOSIS — D509 Iron deficiency anemia, unspecified: Secondary | ICD-10-CM | POA: Diagnosis not present

## 2020-05-06 DIAGNOSIS — D509 Iron deficiency anemia, unspecified: Secondary | ICD-10-CM | POA: Diagnosis not present

## 2020-05-06 DIAGNOSIS — N186 End stage renal disease: Secondary | ICD-10-CM | POA: Diagnosis not present

## 2020-05-06 DIAGNOSIS — Z992 Dependence on renal dialysis: Secondary | ICD-10-CM | POA: Diagnosis not present

## 2020-05-08 DIAGNOSIS — L603 Nail dystrophy: Secondary | ICD-10-CM | POA: Diagnosis not present

## 2020-05-08 DIAGNOSIS — E1142 Type 2 diabetes mellitus with diabetic polyneuropathy: Secondary | ICD-10-CM | POA: Diagnosis not present

## 2020-05-09 DIAGNOSIS — D509 Iron deficiency anemia, unspecified: Secondary | ICD-10-CM | POA: Diagnosis not present

## 2020-05-09 DIAGNOSIS — Z992 Dependence on renal dialysis: Secondary | ICD-10-CM | POA: Diagnosis not present

## 2020-05-09 DIAGNOSIS — N186 End stage renal disease: Secondary | ICD-10-CM | POA: Diagnosis not present

## 2020-05-11 DIAGNOSIS — N186 End stage renal disease: Secondary | ICD-10-CM | POA: Diagnosis not present

## 2020-05-11 DIAGNOSIS — D509 Iron deficiency anemia, unspecified: Secondary | ICD-10-CM | POA: Diagnosis not present

## 2020-05-11 DIAGNOSIS — Z992 Dependence on renal dialysis: Secondary | ICD-10-CM | POA: Diagnosis not present

## 2020-05-14 DIAGNOSIS — Z992 Dependence on renal dialysis: Secondary | ICD-10-CM | POA: Diagnosis not present

## 2020-05-14 DIAGNOSIS — N186 End stage renal disease: Secondary | ICD-10-CM | POA: Diagnosis not present

## 2020-05-14 DIAGNOSIS — D509 Iron deficiency anemia, unspecified: Secondary | ICD-10-CM | POA: Diagnosis not present

## 2020-05-16 DIAGNOSIS — N186 End stage renal disease: Secondary | ICD-10-CM | POA: Diagnosis not present

## 2020-05-16 DIAGNOSIS — Z992 Dependence on renal dialysis: Secondary | ICD-10-CM | POA: Diagnosis not present

## 2020-05-16 DIAGNOSIS — R109 Unspecified abdominal pain: Secondary | ICD-10-CM | POA: Diagnosis not present

## 2020-05-16 DIAGNOSIS — D509 Iron deficiency anemia, unspecified: Secondary | ICD-10-CM | POA: Diagnosis not present

## 2020-05-18 DIAGNOSIS — Z992 Dependence on renal dialysis: Secondary | ICD-10-CM | POA: Diagnosis not present

## 2020-05-18 DIAGNOSIS — N186 End stage renal disease: Secondary | ICD-10-CM | POA: Diagnosis not present

## 2020-05-18 DIAGNOSIS — D509 Iron deficiency anemia, unspecified: Secondary | ICD-10-CM | POA: Diagnosis not present

## 2020-05-19 DIAGNOSIS — N186 End stage renal disease: Secondary | ICD-10-CM | POA: Diagnosis not present

## 2020-06-05 ENCOUNTER — Ambulatory Visit (INDEPENDENT_AMBULATORY_CARE_PROVIDER_SITE_OTHER): Payer: Medicare Other

## 2020-06-05 DIAGNOSIS — I480 Paroxysmal atrial fibrillation: Secondary | ICD-10-CM

## 2020-06-05 DIAGNOSIS — R55 Syncope and collapse: Secondary | ICD-10-CM | POA: Diagnosis not present

## 2020-06-05 LAB — CUP PACEART REMOTE DEVICE CHECK
Battery Remaining Longevity: 162 mo
Battery Remaining Percentage: 100 %
Brady Statistic RA Percent Paced: 24 %
Brady Statistic RV Percent Paced: 1 %
Date Time Interrogation Session: 20220117103300
Implantable Lead Implant Date: 20190514
Implantable Lead Implant Date: 20190514
Implantable Lead Location: 753859
Implantable Lead Location: 753860
Implantable Lead Model: 7741
Implantable Lead Model: 7742
Implantable Lead Serial Number: 1015225
Implantable Lead Serial Number: 1020907
Implantable Pulse Generator Implant Date: 20190514
Lead Channel Impedance Value: 1043 Ohm
Lead Channel Impedance Value: 753 Ohm
Lead Channel Pacing Threshold Amplitude: 1.4 V
Lead Channel Pacing Threshold Pulse Width: 0.4 ms
Lead Channel Setting Pacing Amplitude: 2 V
Lead Channel Setting Pacing Amplitude: 2.5 V
Lead Channel Setting Pacing Pulse Width: 0.4 ms
Lead Channel Setting Sensing Sensitivity: 1.5 mV
Pulse Gen Serial Number: 832937

## 2020-06-16 ENCOUNTER — Ambulatory Visit (INDEPENDENT_AMBULATORY_CARE_PROVIDER_SITE_OTHER): Payer: Medicare Other | Admitting: Urology

## 2020-06-16 ENCOUNTER — Other Ambulatory Visit: Payer: Self-pay

## 2020-06-16 ENCOUNTER — Encounter: Payer: Self-pay | Admitting: Urology

## 2020-06-16 VITALS — BP 139/75 | HR 60 | Temp 97.9°F | Ht 74.0 in | Wt 205.0 lb

## 2020-06-16 DIAGNOSIS — N3281 Overactive bladder: Secondary | ICD-10-CM | POA: Insufficient documentation

## 2020-06-16 DIAGNOSIS — N5201 Erectile dysfunction due to arterial insufficiency: Secondary | ICD-10-CM | POA: Diagnosis not present

## 2020-06-16 DIAGNOSIS — I6523 Occlusion and stenosis of bilateral carotid arteries: Secondary | ICD-10-CM | POA: Diagnosis not present

## 2020-06-16 DIAGNOSIS — N138 Other obstructive and reflux uropathy: Secondary | ICD-10-CM

## 2020-06-16 DIAGNOSIS — N401 Enlarged prostate with lower urinary tract symptoms: Secondary | ICD-10-CM | POA: Diagnosis not present

## 2020-06-16 DIAGNOSIS — R339 Retention of urine, unspecified: Secondary | ICD-10-CM

## 2020-06-16 DIAGNOSIS — N390 Urinary tract infection, site not specified: Secondary | ICD-10-CM | POA: Diagnosis not present

## 2020-06-16 LAB — BLADDER SCAN AMB NON-IMAGING: Scan Result: 0

## 2020-06-16 MED ORDER — TADALAFIL 20 MG PO TABS: 20.0000 mg | ORAL_TABLET | Freq: Every day | ORAL | 5 refills | Status: DC | PRN

## 2020-06-16 MED ORDER — NITROFURANTOIN MACROCRYSTAL 50 MG PO CAPS: 50.0000 mg | ORAL_CAPSULE | Freq: Every day | ORAL | 11 refills | Status: DC

## 2020-06-16 NOTE — Progress Notes (Signed)
Urological Symptom Review  Patient is experiencing the following symptoms: Hard to postpone urination Burning/pain with urination Trouble starting stream Blood in urine Urinary tract infection Erection problems (male only) Penile pain (male only)    Review of Systems  Gastrointestinal (upper)  : Negative for upper GI symptoms  Gastrointestinal (lower) : Constipation  Constitutional : Weight loss  Skin: Negative for skin symptoms  Eyes: Negative for eye symptoms  Ear/Nose/Throat : Negative for Ear/Nose/Throat symptoms  Hematologic/Lymphatic: Easy bruising  Cardiovascular : Negative for cardiovascular symptoms  Respiratory : Negative for respiratory symptoms  Endocrine: Negative for endocrine symptoms  Musculoskeletal: Back pain  Neurological: Dizziness  Psychologic: Negative for psychiatric symptoms

## 2020-06-16 NOTE — Patient Instructions (Signed)
Urinary Tract Infection, Adult  A urinary tract infection (UTI) is an infection of any part of the urinary tract. The urinary tract includes the kidneys, ureters, bladder, and urethra. These organs make, store, and get rid of urine in the body. An upper UTI affects the ureters and kidneys. A lower UTI affects the bladder and urethra. What are the causes? Most urinary tract infections are caused by bacteria in your genital area around your urethra, where urine leaves your body. These bacteria grow and cause inflammation of your urinary tract. What increases the risk? You are more likely to develop this condition if:  You have a urinary catheter that stays in place.  You are not able to control when you urinate or have a bowel movement (incontinence).  You are male and you: ? Use a spermicide or diaphragm for birth control. ? Have low estrogen levels. ? Are pregnant.  You have certain genes that increase your risk.  You are sexually active.  You take antibiotic medicines.  You have a condition that causes your flow of urine to slow down, such as: ? An enlarged prostate, if you are male. ? Blockage in your urethra. ? A kidney stone. ? A nerve condition that affects your bladder control (neurogenic bladder). ? Not getting enough to drink, or not urinating often.  You have certain medical conditions, such as: ? Diabetes. ? A weak disease-fighting system (immunesystem). ? Sickle cell disease. ? Gout. ? Spinal cord injury. What are the signs or symptoms? Symptoms of this condition include:  Needing to urinate right away (urgency).  Frequent urination. This may include small amounts of urine each time you urinate.  Pain or burning with urination.  Blood in the urine.  Urine that smells bad or unusual.  Trouble urinating.  Cloudy urine.  Vaginal discharge, if you are male.  Pain in the abdomen or the lower back. You may also have:  Vomiting or a decreased  appetite.  Confusion.  Irritability or tiredness.  A fever or chills.  Diarrhea. The first symptom in older adults may be confusion. In some cases, they may not have any symptoms until the infection has worsened. How is this diagnosed? This condition is diagnosed based on your medical history and a physical exam. You may also have other tests, including:  Urine tests.  Blood tests.  Tests for STIs (sexually transmitted infections). If you have had more than one UTI, a cystoscopy or imaging studies may be done to determine the cause of the infections. How is this treated? Treatment for this condition includes:  Antibiotic medicine.  Over-the-counter medicines to treat discomfort.  Drinking enough water to stay hydrated. If you have frequent infections or have other conditions such as a kidney stone, you may need to see a health care provider who specializes in the urinary tract (urologist). In rare cases, urinary tract infections can cause sepsis. Sepsis is a life-threatening condition that occurs when the body responds to an infection. Sepsis is treated in the hospital with IV antibiotics, fluids, and other medicines. Follow these instructions at home: Medicines  Take over-the-counter and prescription medicines only as told by your health care provider.  If you were prescribed an antibiotic medicine, take it as told by your health care provider. Do not stop using the antibiotic even if you start to feel better. General instructions  Make sure you: ? Empty your bladder often and completely. Do not hold urine for long periods of time. ? Empty your bladder after   sex. ? Wipe from front to back after urinating or having a bowel movement if you are male. Use each tissue only one time when you wipe.  Drink enough fluid to keep your urine pale yellow.  Keep all follow-up visits. This is important.   Contact a health care provider if:  Your symptoms do not get better after 1-2  days.  Your symptoms go away and then return. Get help right away if:  You have severe pain in your back or your lower abdomen.  You have a fever or chills.  You have nausea or vomiting. Summary  A urinary tract infection (UTI) is an infection of any part of the urinary tract, which includes the kidneys, ureters, bladder, and urethra.  Most urinary tract infections are caused by bacteria in your genital area.  Treatment for this condition often includes antibiotic medicines.  If you were prescribed an antibiotic medicine, take it as told by your health care provider. Do not stop using the antibiotic even if you start to feel better.  Keep all follow-up visits. This is important. This information is not intended to replace advice given to you by your health care provider. Make sure you discuss any questions you have with your health care provider. Document Revised: 12/17/2019 Document Reviewed: 12/17/2019 Elsevier Patient Education  2021 [employer]  

## 2020-06-16 NOTE — Progress Notes (Signed)
06/16/2020 11:59 AM   [name] 23-Aug-1950 [id number]  Referring provider: Manon Hilding, MD [address],   [address]  Followup BPH  HPI: Mr Devino is a 70yo here for followup for BPH with incomplete emptying and OAB. He is currently on flomax 0.4mg  and vesicare 5mg . PVR is 0cc. Urine stream strong. He has improvement in his urgency, frequency and nocturia since last visit. Since last visit he has had multiple UTIs and is currently on keflex for an Ecoli UTI. He has dysuria. No gross hematuria. He continues to have difficulty getting an erection for the past 2 years. Erection is not firm enough for penetration. Good libido.   PMH: Past Medical History:  Diagnosis Date  . AAA (abdominal aortic aneurysm) ([place]) 06/2016   Mild increase in size of 3.4 cm infrarenal abdominal aortic aneurysm  . Anemia   . Aortic atherosclerosis ([place])   . Asthma   . Atrial flutter ([place])   . AVF (arteriovenous fistula) (H[facility])    Left forearm  . CAD (coronary artery disease)    a. s/p prior stenting of RCA in 1999 b. cath in 2003 showing moderate CAD c. NST in 2016 showing small area of ischemic and low-risk  . CHF (congestive heart failure) ([place])   . CKD (chronic kidney disease), stage V ([place])    kidney transplant evaluation  . COPD (chronic obstructive pulmonary disease) ([place])    with ongoing tobacco use and patient failed sham takes  . Diverticulosis 2018   Mild sigmoid colon diverticulosis.  Marland Kitchen DM2 (diabetes mellitus, type 2) ([place])   . Dyslipidemia   . Dysrhythmia 2018   atrial fibrillation  . Edema    chronic lower extremity secondary to right heart failure and chronic venous insufficiency  . Fatigue   . Fatty liver 2010   Mild  . Headache   . HTN (hypertension)   . Morbid obesity ([name])   . Multiple pulmonary nodules 05/2018   Bilateral  . Pneumonia   . Presence of permanent cardiac pacemaker   . 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   Marland Kitchen Reflux esophagitis    hx  . Sleep apnea    PT STATES ONE TEST WAS POSITIVE AND ANOTHER TEST WAS NEGATIVE  . Two-vessel coronary artery disease    moderate. by cath in 2003. Status post stenting of the mdi RCA November 1999 normal left ventricular ejection fraction  . Wears glasses   . Wears hearing aid    B/L    Surgical History: Past Surgical History:  Procedure Laterality Date  . A/V FISTULAGRAM Left 04/30/2019   Procedure: A/V FISTULAGRAM;  Surgeon: Elam Dutch, MD;  Location: [facility];  Service: Cardiovascular;  Laterality: Left;  . A/V FISTULAGRAM Left 09/22/2019   Procedure: A/V FISTULAGRAM;  Surgeon: Marty Heck, MD;  Location: [facility];  Service: Cardiovascular;  Laterality: Left;  . ABDOMINAL SURGERY    . APPENDECTOMY    . AV FISTULA PLACEMENT Right 03/11/2018   Procedure: CREATION Brachiocephalic Fistula RIGHT ARM;  Surgeon: Rosetta Posner, MD;  Location: [facility];  Service: Vascular;  Laterality: Right;  . AV FISTULA PLACEMENT Left 05/06/2019   Procedure: LEFT BRACHIOCEPHALIC ARTERIOVENOUS (AV) FISTULA CREATION;  Surgeon: Marty Heck, MD;  Location: [facility];  Service: Vascular;  Laterality: Left;  . BACK SURGERY     x3; cages in patient's back since 2000  .  BIOPSY  11/12/2018   Procedure: BIOPSY;  Surgeon: Laurence Spates, MD;  [facility]: [facility];  Service: Endoscopy;;  . CARDIAC CATHETERIZATION     stent  . CHOLECYSTECTOMY    . CLOSED REDUCTION SHOULDER DISLOCATION    . COLONOSCOPY W/ BIOPSIES AND POLYPECTOMY    . COLONOSCOPY WITH PROPOFOL N/A 11/12/2018   Procedure: COLONOSCOPY WITH PROPOFOL;  Surgeon: Laurence Spates, MD;  Location: [facility];  Service: Endoscopy;  Laterality: N/A;  . CORONARY ANGIOPLASTY    . diabetic ulcers    . DIAGNOSTIC LAPAROSCOPY    . DIALYSIS/PERMA CATHETER REMOVAL N/A 09/22/2019   Procedure: DIALYSIS/PERMA CATHETER REMOVAL;  Surgeon: Marty Heck, MD;  Location: [facility];  Service: Cardiovascular;  Laterality: N/A;  . ESOPHAGOGASTRODUODENOSCOPY (EGD) WITH PROPOFOL N/A 11/12/2018   Procedure: ESOPHAGOGASTRODUODENOSCOPY (EGD) WITH PROPOFOL;  Surgeon: Laurence Spates, MD;  [facility];  Service: Endoscopy;  Laterality: N/A;  . GROIN EXPLORATION    . HEMOSTASIS CLIP PLACEMENT  11/12/2018   Procedure: HEMOSTASIS CLIP PLACEMENT;  Surgeon: Laurence Spates, MD;  [facility];  Service: Endoscopy;;  . INSERT / REPLACE / REMOVE PACEMAKER    . INSERTION OF ARTERIOVENOUS (AV) ARTEGRAFT ARM Left 03/18/2018   Procedure: INSERTION OF ARTERIOVENOUS (AV) FISTULA;  Surgeon: Rosetta Posner, MD;  Location: [facility];  Service: Vascular;  Laterality: Left;  . IR FLUORO GUIDE CV LINE RIGHT  04/11/2019  . IR US GUIDE VASC ACCESS RIGHT  04/11/2019  . LEFT HEART CATH AND CORONARY ANGIOGRAPHY N/A 07/23/2019   Procedure: LEFT HEART CATH AND CORONARY ANGIOGRAPHY;  Surgeon: Martinique, Peter M, MD;  Location: [facility];  Service: Cardiovascular;  Laterality: N/A;  . LIGATION ARTERIOVENOUS GORTEX GRAFT Right 03/18/2018   Procedure: LIGATION ARTERIOVENOUS FISTULA;  Surgeon: Rosetta Posner, MD;  Location: [facility];  Service: Vascular;  Laterality: Right;  . OSTECTOMY Left 04/23/2017   Procedure: OSTECTOMY LEFT GREAT TOE;  Surgeon: [name], DPM;  Location: [facility];  Service: Podiatry;  Laterality: Left;  left great toe  . POLYPECTOMY  11/12/2018   Procedure: POLYPECTOMY;  Surgeon: Laurence Spates, MD;  [facility];  Service: Endoscopy;;  . TUMOR REMOVAL     small intestine x3  . VISCERAL ANGIOGRAPHY N/A 08/02/2019   Procedure: VISCERAL ANGIOGRAPHY;  Surgeon: Waynetta Sandy, MD;  Location: [facility];  Service: Cardiovascular;  Laterality: N/A;    Home Medications:  Allergies as of 06/16/2020   No Known Allergies     Medication List       Accurate as of June 16, 2020 11:59 AM. If you have any questions, ask your nurse  or doctor.        acetaminophen 500 MG tablet Commonly known as: TYLENOL Take 1,000 mg by mouth in the morning and at bedtime.   albuterol 108 (90 Base) MCG/ACT inhaler Commonly known as: VENTOLIN HFA Inhale 2 puffs into the lungs every 6 (six) hours as needed for wheezing or shortness of breath.   alendronate 70 MG tablet Commonly known as: FOSAMAX Take 70 mg by mouth once a week.   aspirin 81 MG EC tablet Take 1 tablet (81 mg total) by mouth daily.   atorvastatin 40 MG tablet Commonly known as: LIPITOR Take 1 tablet (40 mg total) by mouth daily.   baclofen 10 MG tablet Commonly known as: LIORESAL Take 10 mg by mouth 3 (three) times daily as needed.   beclomethasone 80 MCG/ACT inhaler Commonly known as: QVAR Inhale  2 puffs into the lungs 2 (two) times daily as needed (shortness of breath).   bumetanide 2 MG tablet Commonly known as: BUMEX Take 2 mg by mouth 2 (two) times daily.   buPROPion 150 MG 12 hr tablet Commonly known as: WELLBUTRIN SR Take 150 mg by mouth 2 (two) times daily.   carvedilol 25 MG tablet Commonly known as: COREG Take 1 tablet (25 mg total) by mouth See admin instructions. Take 25 mg twice daily on Sun, Mon, Wed, and Fri. Take 25 mg in the evening on Tues, Thurs, and Sat (dialysis days)   Docusate Sodium 50 MG/15ML Liqd 2 capsule as needed   ferric citrate 1 GM 210 MG(Fe) tablet Commonly known as: AURYXIA Take 210-420 mg by mouth. Take 2 tablets three times a day with meals and 1 tablet daily with snacks   insulin glargine 100 UNIT/ML injection Commonly known as: LANTUS Inject into the skin daily.   Keflex 500 MG capsule Generic drug: cephALEXin 1 capsule   lidocaine 5 % Commonly known as: LIDODERM Place 1 patch onto the skin daily. Remove & Discard patch within 12 hours or as directed by MD   linaclotide 290 MCG Caps capsule Commonly known as: LINZESS Take 290 mcg by mouth daily as needed (constipation).   Melatonin 10 MG  Tabs Take 10 mg by mouth at bedtime as needed (sleep).   multivitamin Tabs tablet Take 1 tablet by mouth daily.   nitroGLYCERIN 0.4 MG SL tablet Commonly known as: NITROSTAT Place 1 tablet (0.4 mg total) under the tongue every 5 (five) minutes as needed for chest pain.   omeprazole 20 MG capsule Commonly known as: PRILOSEC Take 20 mg by mouth daily.   polyethylene glycol 17 g packet Commonly known as: MIRALAX / GLYCOLAX Take 17 g by mouth daily as needed for mild constipation. Mix in morning coffee   predniSONE 20 MG tablet Commonly known as: DELTASONE Take 40 mg by mouth daily.   risperiDONE 0.5 MG tablet Commonly known as: RISPERDAL Take 0.5 mg by mouth at bedtime.   ROPINIROLE HCL PO Take 0.25 mg by mouth at bedtime.   Sennosides 25 MG Tabs Take 50 mg by mouth daily as needed (constipation).   sildenafil 100 MG tablet Commonly known as: VIAGRA Take 100 mg by mouth daily.   solifenacin 5 MG tablet Commonly known as: VESICARE Take 1 tablet (5 mg total) by mouth daily.   tamsulosin 0.4 MG Caps capsule Commonly known as: FLOMAX Take 1 capsule (0.4 mg total) by mouth daily after supper.   tiZANidine 2 MG tablet Commonly known as: ZANAFLEX Take 2 mg by mouth at bedtime.   traMADol 50 MG tablet Commonly known as: ULTRAM Take by mouth every 6 (six) hours as needed.   Vitamin D3 50 MCG (2000 UT) Tabs Take 2,000 Units by mouth daily.       Allergies: No Known Allergies  Family History: Family History  Problem Relation Age of Onset  . Diabetes Mother   . Alcohol abuse Father     Social History:  reports that he quit smoking about 6 months ago. His smoking use included cigarettes. He started smoking about 52 years ago. He has a 40.00 pack-year smoking history. He has never used smokeless tobacco. He reports previous alcohol use. He reports that he does not use drugs.  ROS: All other review of systems were reviewed and are negative except what is noted  above in HPI  Physical Exam: BP 139/75  Pulse 60   Temp 97.9 F (36.6 C)   Ht 6\' 2"  (1.88 m)   Wt 205 lb (93 kg)   BMI 26.32 kg/m   Constitutional:  Alert and oriented, No acute distress. HEENT: [place] AT, moist mucus membranes.  Trachea midline, no masses. Cardiovascular: No clubbing, cyanosis, or edema. Respiratory: Normal respiratory effort, no increased work of breathing. GI: Abdomen is soft, nontender, nondistended, no abdominal masses GU: No CVA tenderness.  Lymph: No cervical or inguinal lymphadenopathy. Skin: No rashes, bruises or suspicious lesions. Neurologic: Grossly intact, no focal deficits, moving all 4 extremities. Psychiatric: Normal mood and affect.  Laboratory Data: Lab Results  Component Value Date   WBC 7.2 11/23/2019   HGB 14.4 11/23/2019   HCT 43.9 11/23/2019   MCV 101.4 (H) 11/23/2019   PLT 193 11/23/2019    Lab Results  Component Value Date   CREATININE 4.72 (H) 11/23/2019    No results found for: PSA  Lab Results  Component Value Date   TESTOSTERONE 297 07/28/2015    Lab Results  Component Value Date   HGBA1C 5.8 (H) 04/14/2017    Urinalysis    Component Value Date/Time   COLORURINE YELLOW 07/22/2019 1625   APPEARANCEUR Cloudy (A) 04/10/2020 0000   LABSPEC 1.010 07/22/2019 1625   PHURINE 6.0 07/22/2019 1625   GLUCOSEU Negative 04/10/2020 0000   HGBUR NEGATIVE 07/22/2019 1625   BILIRUBINUR Negative 04/10/2020 0000   KETONESUR NEGATIVE 07/22/2019 1625   PROTEINUR 3+ (A) 04/10/2020 0000   PROTEINUR >=300 (A) 07/22/2019 1625   UROBILINOGEN 0.2 09/27/2014 0027   NITRITE Negative 04/10/2020 0000   NITRITE NEGATIVE 07/22/2019 1625   LEUKOCYTESUR 3+ (A) 04/10/2020 0000   LEUKOCYTESUR NEGATIVE 07/22/2019 1625    Lab Results  Component Value Date   LABMICR See below: 04/10/2020   WBCUA >30 (H) 04/10/2020   LABEPIT None seen 04/10/2020   MUCUS Present 02/16/2020   BACTERIA Many (A) 04/10/2020    Pertinent Imaging:  No results  found for this or any previous visit.  No results found for this or any previous visit.  No results found for this or any previous visit.  No results found for this or any previous visit.  Results for orders placed during the hospital encounter of 04/04/16  US Renal  Narrative CLINICAL DATA:  Hematuria.  EXAM: RENAL / URINARY TRACT ULTRASOUND COMPLETE  COMPARISON:  09/13/2015  FINDINGS: Right Kidney:  Length: 13.0 cm. Echogenicity within normal limits. No mass or hydronephrosis visualized.  Left Kidney:  Length: 13.8 cm. Echogenicity is normal. Suspect mild renal parenchymal thinning. In the mid aspect of the left renal pelvis and may represent a vascular calcification or intrarenal stone.  Bladder:  Appears normal for degree of bladder distention.  IMPRESSION: 1. No hydronephrosis. 2. Vascular calcification versus intrarenal calculus in the mid pole region of the left kidney. 3. Mild renal parenchymal thinning of the left kidney.   Electronically Signed By: Nolon Nations M.D. On: 04/05/2016 15:42  No results found for this or any previous visit.  No results found for this or any previous visit.  No results found for this or any previous visit.   Assessment & Plan:    1. Incomplete emptying of bladder -flomax 0.4mg  daily - BLADDER SCAN AMB NON-IMAGING - Urinalysis, Routine w reflex microscopic  2. Benign prostatic hyperplasia with urinary obstruction -flomax 0.4mg  daily  3. Erectile dysfunction -tadalafil 20mg  prn  4. Recurrent UTI macrobid 50mg  qhs   No follow-ups on file.  Nicolette Bang, MD  [facility] [place]

## 2020-06-17 NOTE — Progress Notes (Signed)
Remote pacemaker transmission.   

## 2020-06-19 ENCOUNTER — Other Ambulatory Visit: Payer: Self-pay

## 2020-06-23 ENCOUNTER — Telehealth: Payer: Self-pay | Admitting: Family Medicine

## 2020-06-23 ENCOUNTER — Telehealth: Payer: Self-pay | Admitting: Cardiology

## 2020-06-23 DIAGNOSIS — I6523 Occlusion and stenosis of bilateral carotid arteries: Secondary | ICD-10-CM

## 2020-06-23 NOTE — Telephone Encounter (Signed)
Yes he should have a yearly carotid artery duplex study.  He has some medium carotid artery stenosis.  Go ahead and order that for him.  Thank you

## 2020-06-23 NOTE — Telephone Encounter (Signed)
New message    Patient called and stated that he is due to have a Carotid test done? There is no order in the system , please call

## 2020-06-23 NOTE — Telephone Encounter (Signed)
Pt last carotid was in Feb 2021 and per result notes pt was to be referred to [facility] - pt says he never heard from anyone about scheduling appt with them, he was seen in October by Katina Dung, NP and says he was told he needed a repeat carotid this month, will forward to provider if ok to order

## 2020-06-23 NOTE — Telephone Encounter (Signed)
PERCERT FOR :  US Carotid Duplex Bilateral   06/30/2020 AT [facility]

## 2020-06-23 NOTE — Telephone Encounter (Signed)
Orders placed and will forward to scheduling

## 2020-06-30 ENCOUNTER — Ambulatory Visit (HOSPITAL_COMMUNITY): Payer: Medicare Other

## 2020-07-07 ENCOUNTER — Ambulatory Visit (HOSPITAL_COMMUNITY): Payer: Medicare Other

## 2020-07-12 ENCOUNTER — Other Ambulatory Visit: Payer: Self-pay

## 2020-07-12 DIAGNOSIS — R339 Retention of urine, unspecified: Secondary | ICD-10-CM

## 2020-07-12 MED ORDER — SOLIFENACIN SUCCINATE 5 MG PO TABS: 5.0000 mg | ORAL_TABLET | Freq: Every day | ORAL | 2 refills | Status: DC

## 2020-07-12 NOTE — Progress Notes (Signed)
Wife called requesting refill on vesicare. Refill sent to last until next OV. Patient wife made aware.

## 2020-07-17 ENCOUNTER — Other Ambulatory Visit: Payer: Self-pay | Admitting: *Deleted

## 2020-07-17 DIAGNOSIS — I6523 Occlusion and stenosis of bilateral carotid arteries: Secondary | ICD-10-CM

## 2020-07-17 NOTE — Progress Notes (Signed)
CAROTID

## 2020-07-21 ENCOUNTER — Telehealth: Payer: Self-pay | Admitting: Family Medicine

## 2020-07-21 NOTE — Telephone Encounter (Signed)
Dr. [name] - please advise if this is okay.

## 2020-07-21 NOTE — Telephone Encounter (Signed)
New messasge    Patient wife called patient needs to have Carotid testing done and they would like those orders faxed over to [facility] in [place] its closer to the patients house to have the testing done   Office number [phone] Fax [phone]

## 2020-07-24 NOTE — Telephone Encounter (Signed)
That is fine   J Branch MD 

## 2020-07-24 NOTE — Telephone Encounter (Signed)
Left message to return call.   Order has been faxed to [facility].

## 2020-07-25 NOTE — Telephone Encounter (Signed)
Patient wife returned call and thanked you for sending those orders over. No need for return call unless you need to speak to her

## 2020-08-18 ENCOUNTER — Telehealth: Payer: Self-pay

## 2020-08-18 ENCOUNTER — Other Ambulatory Visit: Payer: Self-pay

## 2020-08-18 DIAGNOSIS — N390 Urinary tract infection, site not specified: Secondary | ICD-10-CM

## 2020-08-21 ENCOUNTER — Other Ambulatory Visit: Payer: Self-pay

## 2020-08-21 ENCOUNTER — Other Ambulatory Visit: Payer: Medicare Other

## 2020-08-21 DIAGNOSIS — N390 Urinary tract infection, site not specified: Secondary | ICD-10-CM

## 2020-08-21 LAB — URINALYSIS, ROUTINE W REFLEX MICROSCOPIC
Bilirubin, UA: NEGATIVE
Glucose, UA: NEGATIVE
Ketones, UA: NEGATIVE
Nitrite, UA: NEGATIVE
Specific Gravity, UA: 1.025 (ref 1.005–1.030)
Urobilinogen, Ur: 0.2 mg/dL (ref 0.2–1.0)
pH, UA: 5.5 (ref 5.0–7.5)

## 2020-08-21 LAB — MICROSCOPIC EXAMINATION
Epithelial Cells (non renal): >10 /hpf — AB (ref 0–10)
Renal Epithel, UA: NONE SEEN /hpf
WBC, UA: >30 /hpf — AB (ref 0–5)

## 2020-08-21 NOTE — Telephone Encounter (Signed)
Pt left urine specimen for testing.

## 2020-08-22 ENCOUNTER — Telehealth: Payer: Self-pay

## 2020-08-22 ENCOUNTER — Telehealth: Payer: Self-pay | Admitting: Family Medicine

## 2020-08-22 NOTE — Telephone Encounter (Signed)
Calling for results of Carotid that was done at [facility]

## 2020-08-22 NOTE — Telephone Encounter (Signed)
Pts wife called to verify a culture was sent for urine. Notified her that it was sent.

## 2020-08-22 NOTE — Telephone Encounter (Signed)
Contacted patient's spouse. Verbalized to spouse that we have not yet received the results of patient's carotid, but as soon as they have been resulted, we will call and relay them to patient and spouse. She verbalized understanding.

## 2020-08-24 ENCOUNTER — Other Ambulatory Visit: Payer: Self-pay | Admitting: Cardiology

## 2020-08-24 ENCOUNTER — Telehealth: Payer: Self-pay

## 2020-08-24 ENCOUNTER — Telehealth: Payer: Self-pay | Admitting: Cardiology

## 2020-08-24 NOTE — Telephone Encounter (Signed)
Patient called requesting results of recent doppler.

## 2020-08-24 NOTE — Telephone Encounter (Signed)
Pt called wanting to know if his urine culture was back. I explained it is not back yet.

## 2020-08-25 ENCOUNTER — Other Ambulatory Visit: Payer: Self-pay

## 2020-08-25 DIAGNOSIS — N390 Urinary tract infection, site not specified: Secondary | ICD-10-CM

## 2020-08-25 MED ORDER — CEFPODOXIME PROXETIL 200 MG PO TABS: 200.0000 mg | ORAL_TABLET | Freq: Two times a day (BID) | ORAL | 0 refills | Status: DC

## 2020-08-25 MED ORDER — NITROFURANTOIN MONOHYD MACRO 100 MG PO CAPS: 100.0000 mg | ORAL_CAPSULE | Freq: Two times a day (BID) | ORAL | 0 refills | Status: DC

## 2020-08-25 NOTE — Telephone Encounter (Signed)
  Pt wife [name]) voiced understanding    Carotid US shows mild bilateral blockages, continue to monitor   Zandra Abts MD

## 2020-08-25 NOTE — Telephone Encounter (Signed)
Just resulted to [facility]  J Braylin Xu MD 

## 2020-08-25 NOTE — Telephone Encounter (Signed)
Spoke with pt. Explained urine culture was at [facility] and has not been released yet. Got preliminary report and Dr. Alyson Ingles ordered Bennie Pierini. Pt. Notified and prescription sent.

## 2020-08-26 LAB — CULTURE, URINE COMPREHENSIVE

## 2020-08-28 ENCOUNTER — Telehealth: Payer: Self-pay

## 2020-08-28 NOTE — Telephone Encounter (Signed)
-----   Message -----  From: [name]  Sent: 08/25/2020 10:37 AM EDT  To: Mychart Admin  Subject: Appointment Missed                  I was asked to bring a urine specimen to your office. I did so on 08/21/20. I had no appointments scheduled with you. I do not appreciate being called a "no show". I have waited 5 days for assistance from you with the promise a urine culture would be done in addition to the urinalysis. I have called your office twice this week and was told you are waiting on the urine culture. Well [facility] doesn't have a urine culture for me. So why are you waiting for one? I was promised this culture would be done and had been in fact not been done. I am miserable with this infection. Left untreated I will be in the hospital with urosepsis. We trusted you. But it is just like the last time I had an infection and you failed to follow through. Now I guess you'll want me to give you another specimen for a culture. And I can wait all weekend and next week for you to determine my treatment. Not that it matters to you; all you can do is call me a no show     I called patient today after receiving the above message.  I spoke with the patient wife. I apologized for the No Show Status as patient did drop off urine sample as requested.  I informed wife I saw two antibiotics that were sent in on 08/25/2020. Wife confirmed she did have someone call on Friday but did no know nurse name.  Wife reported the patient was currently taking the prescriptions that had been prescribed.I reviewed patient medication list. Patient is taking macrodantin 50mg  nightly for preventative and macrobid 100mg  BID was prescribed for UTI symptoms. I reviewed with wife that those were different  Antibiotic as one was for [facility] and other was to treat acute infections. Wife stated she was not told that - that is why she did not give patient that medication.  Wife reports little improvement on the 2nd  antibiotic vantin. Patient is also dialysis patient so oral intake in limited. Patient wife was instructed she can drop off repeat urine after completion of antibiotic if she would like to ensure urine culture is negative. Wife verbalized understanding. Wife also instructed if no improvement on vantin- to start macrobid 100mg  BID as original prescribed. Wife voiced understanding.

## 2020-09-01 ENCOUNTER — Encounter: Payer: Self-pay | Admitting: Cardiology

## 2020-09-01 ENCOUNTER — Encounter: Payer: Self-pay | Admitting: *Deleted

## 2020-09-01 ENCOUNTER — Ambulatory Visit (INDEPENDENT_AMBULATORY_CARE_PROVIDER_SITE_OTHER): Payer: Medicare Other | Admitting: Cardiology

## 2020-09-01 ENCOUNTER — Other Ambulatory Visit: Payer: Self-pay

## 2020-09-01 VITALS — BP 130/76 | HR 60 | Ht 76.0 in | Wt 208.4 lb

## 2020-09-01 DIAGNOSIS — E782 Mixed hyperlipidemia: Secondary | ICD-10-CM | POA: Diagnosis not present

## 2020-09-01 DIAGNOSIS — I10 Essential (primary) hypertension: Secondary | ICD-10-CM | POA: Diagnosis not present

## 2020-09-01 DIAGNOSIS — I480 Paroxysmal atrial fibrillation: Secondary | ICD-10-CM

## 2020-09-01 DIAGNOSIS — I6523 Occlusion and stenosis of bilateral carotid arteries: Secondary | ICD-10-CM | POA: Diagnosis not present

## 2020-09-01 DIAGNOSIS — I2510 Atherosclerotic heart disease of native coronary artery without angina pectoris: Secondary | ICD-10-CM

## 2020-09-01 NOTE — Patient Instructions (Signed)
Your physician recommends that you schedule a follow-up appointment in: 6 MONTHS WITH DR BRANCH  Your physician recommends that you continue on your current medications as directed. Please refer to the Current Medication list given to you today.  Thank you for choosing [facility]!!    

## 2020-09-01 NOTE — Progress Notes (Signed)
Clinical Summary [name] is a 70 y.o.male seen today for follow up of the following medical problems.      1. Aflutter/Afib - low afib burden by pacemaker check   - no recent palpitations. No bleeding on eliquis.     - eliquis stopped 10/2018 due to anemia by Dr Rayann Heman   - issues with GI bleeding 10/2018, heme +stools, iron deficiency. Colonscopy with nonbleeding angiodysplastic lesion, small polyps. EGD polyp no signs of bleeding.       - previosuyl discussed watchman device with Dr Rayann Heman  - occasioal palpitaitons, ovreall infrequent    2. Symptomatic bradycardia - s/p pacemaker placement following episode of syncope - admit 09/28/17 to [place], [place] hospital with dizziness and syncope. - followed by EP. Chronically elevated RV lead threshold but stable    Jan 2022 normal device . - no recent symptoms    3. OSA screen - very mild OSA by 11/2017 study,did have sleep hypoxemia     4. Carotid stenosis - noted during recent admission for syncope.  -09/2017 carotid US in [place] with  RICA <76%, LICA 81-15%   11/2618 carotid US: LICA 35-59%, RICA plaque without stenosis. (PA Lenze referred to vascular)  08/2020 carotid US [place]: 1-49% bilateral ICA disease.    5. HTN - compliant with meds   6. CAD   - prior stent to RCA in 1999. Last cath 05/2001 : LM 20-30%, LAD 50-60% And 60-70% mid, LCX with mid AV portion 60-70%, RCA with patent stent with proximal 40-50% disease. LVEF by LVgram 65%, LVEDP 20. Overall moderate lesions, not great anatomically for PCI per notes, treated medically.   - 10/2014 Lexiscan with small area of ischemia mid inferior to apical, overall low risk  - Jan 2019 nuclear stress no ischemia    - admit 07/2019 with NSTEMI (peak trop 229), cath as reported below. Overall complex anatomy difficult to pursue with PCI, recs for medical management.  He only develops symptoms postdialysis when his blood pressure is low.   - imdur lowered to 30mg  due to  headaches.  - started on ranexa 500mg  bid by PA Lenze at 3/22 visit - he reports HD has increased his dry weight with some improvement.  - pain diffuse midabdomen, typically grabbing like pain. +SOB, +hot. Can last 10 minutes up to 2 hours.  - some mild chest discomfort with exertion.    - insurance would not cover ranexa - SBP after HD SBP in 90s.      7. Chronic diastolic HF -74/1638  echo LVEF 60-65%, grade II diastoilc dysfunction -11/2018 echo LVEF 60-65%, grade I dd, normal RV  - stable weights, 205 to 210 - followed nephrology   8. ESRD - followed by renal  - on peritoneal HD, just finished training. Tolerating peritoneal HD - no recurrent chest pain.        6. Hyperlipidemia   - compliant with statin  07/2019 TC 93 TG 95 HDL 38 LDL 36     7. COPD - followed by pcp     8. AAA 06/2016 CT A/P 3.4 cm infrerenal AAA - 07/2019 mesenteric [place] AAA 3.3 x 3.6 cm 07/2019 CT A/P 3.7 AAA, rece [place] 2 years    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   10 . Chronic back - considering back stimulator implant    Past Medical History:  Diagnosis Date   AAA (abdominal aortic aneurysm) ([place]) 06/2016   Mild increase  in size of 3.4 cm infrarenal abdominal aortic aneurysm   Anemia    Aortic atherosclerosis (HCC)    Asthma    Atrial flutter (HCC)    AVF (arteriovenous fistula) (HCC)    Left forearm   CAD (coronary artery disease)    a. s/p prior stenting of RCA in 1999 b. cath in 2003 showing moderate CAD c. NST in 2016 showing small area of ischemic and low-risk   CHF (congestive heart failure) ([place])    CKD (chronic kidney disease), stage V ([place])    kidney transplant evaluation   COPD (chronic obstructive pulmonary disease) ([place])    with ongoing tobacco use and patient failed sham takes   Diverticulosis 2018   Mild sigmoid colon diverticulosis.   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    Reflux esophagitis    hx   Sleep apnea    PT STATES ONE TEST WAS POSITIVE AND ANOTHER TEST WAS NEGATIVE   Two-vessel coronary artery disease    moderate. by cath in 2003. Status post stenting of the mdi RCA November 1999 normal left ventricular ejection fraction   Wears glasses    Wears hearing aid    B/L     No Known Allergies   Current Outpatient Medications  Medication Sig Dispense Refill   acetaminophen (TYLENOL) 500 MG tablet Take 1,000 mg by mouth in the morning and at bedtime.     albuterol (VENTOLIN HFA) 108 (90 Base) MCG/ACT inhaler Inhale 2 puffs into the lungs every 6 (six) hours as needed for wheezing or shortness of breath.      alendronate (FOSAMAX) 70 MG tablet Take 70 mg by mouth once a week. (Patient not taking: Reported on 06/16/2020)     aspirin EC 81 MG EC tablet Take 1 tablet (81 mg total) by mouth daily.     atorvastatin (LIPITOR) 40 MG tablet Take 1 tablet by mouth once daily 90 tablet 1   baclofen (LIORESAL) 10 MG tablet Take 10 mg by mouth 3 (three) times daily as needed. (Patient not taking: No sig reported)     beclomethasone (QVAR) 80 MCG/ACT inhaler Inhale 2 puffs into the lungs 2 (two) times daily as needed (shortness of breath).     bumetanide (BUMEX) 2 MG tablet Take 2 mg by mouth 2 (two) times daily.     buPROPion (WELLBUTRIN SR) 150 MG 12 hr tablet Take 150 mg by mouth 2 (two) times daily.     carvedilol (COREG) 25 MG tablet Take 1 tablet (25 mg total) by mouth See admin instructions. Take 25 mg twice daily on Sun, Mon, Wed, and Fri. Take 25 mg in the  evening on Tues, Thurs, and Sat (dialysis days) 180 tablet 2   cefpodoxime (VANTIN) 200 MG tablet Take 1 tablet (200 mg total) by mouth 2 (two) times daily. 14 tablet 0   cephALEXin (KEFLEX) 500 MG capsule 1 capsule     Cholecalciferol (  VITAMIN D3) 2000 units TABS Take 2,000 Units by mouth daily.      Docusate Sodium 50 MG/15ML LIQD 2 capsule as needed     ferric citrate (AURYXIA) 1 GM 210 MG(Fe) tablet Take 210-420 mg by mouth. Take 2 tablets three times a day with meals and 1 tablet daily with snacks     insulin glargine (LANTUS) 100 UNIT/ML injection Inject into the skin daily. (Patient not taking: No sig reported)     lidocaine (LIDODERM) 5 % Place 1 patch onto the skin daily. Remove & Discard patch within 12 hours or as directed by MD (Patient not taking: No sig reported)     linaclotide (LINZESS) 290 MCG CAPS capsule Take 290 mcg by mouth daily as needed (constipation).     Melatonin 10 MG TABS Take 10 mg by mouth at bedtime as needed (sleep).      multivitamin (RENA-VIT) TABS tablet Take 1 tablet by mouth daily.     nitrofurantoin (MACRODANTIN) 50 MG capsule Take 1 capsule (50 mg total) by mouth at bedtime. 30 capsule 11   nitrofurantoin, macrocrystal-monohydrate, (MACROBID) 100 MG capsule Take 1 capsule (100 mg total) by mouth every 12 (twelve) hours. 14 capsule 0   nitroGLYCERIN (NITROSTAT) 0.4 MG SL tablet Place 1 tablet (0.4 mg total) under the tongue every 5 (five) minutes as needed for chest pain. 25 tablet 3   omeprazole (PRILOSEC) 20 MG capsule Take 20 mg by mouth daily.      polyethylene glycol (MIRALAX / GLYCOLAX) packet Take 17 g by mouth daily as needed for mild constipation. Mix in morning coffee     predniSONE (DELTASONE) 20 MG tablet Take 40 mg by mouth daily. (Patient not taking: No sig reported)     risperiDONE (RISPERDAL) 0.5 MG tablet Take 0.5 mg by mouth at bedtime.     ROPINIROLE HCL PO Take 0.25 mg by mouth at bedtime.     Sennosides 25 MG TABS Take 50 mg by mouth  daily as needed (constipation).      sildenafil (VIAGRA) 100 MG tablet Take 100 mg by mouth daily.     solifenacin (VESICARE) 5 MG tablet Take 1 tablet (5 mg total) by mouth daily. 30 tablet 2   tadalafil (CIALIS) 20 MG tablet Take 1 tablet (20 mg total) by mouth daily as needed. 10 tablet 5   tamsulosin (FLOMAX) 0.4 MG CAPS capsule Take 1 capsule (0.4 mg total) by mouth daily after supper. 90 capsule 3   tiZANidine (ZANAFLEX) 2 MG tablet Take 2 mg by mouth at bedtime.     traMADol (ULTRAM) 50 MG tablet Take by mouth every 6 (six) hours as needed. (Patient not taking: Reported on 06/16/2020)     No current facility-administered medications for this visit.     Past Surgical History:  Procedure Laterality Date   A/V FISTULAGRAM Left 04/30/2019   Procedure: A/V FISTULAGRAM;  Surgeon: Elam Dutch, MD;  Location: [facility];  Service: Cardiovascular;  Laterality: Left;   A/V FISTULAGRAM Left 09/22/2019   Procedure: A/V FISTULAGRAM;  Surgeon: Marty Heck, MD;  Location: [facility];  Service: Cardiovascular;  Laterality: Left;   ABDOMINAL SURGERY     APPENDECTOMY     AV FISTULA PLACEMENT Right 03/11/2018   Procedure: CREATION Brachiocephalic Fistula RIGHT ARM;  Surgeon: Rosetta Posner, MD;  Location: [facility];  Service: Vascular;  Laterality: Right;   AV FISTULA PLACEMENT Left 05/06/2019   Procedure: LEFT BRACHIOCEPHALIC ARTERIOVENOUS (AV) FISTULA  CREATION;  Surgeon: Marty Heck, MD;  Location: [facility];  Service: Vascular;  Laterality: Left;   BACK SURGERY     x3; cages in patient's back since 2000   BIOPSY  11/12/2018   Procedure: BIOPSY;  Surgeon: Laurence Spates, MD;  Location: [facility];  Service: Endoscopy;;   CARDIAC CATHETERIZATION     stent   CHOLECYSTECTOMY     CLOSED REDUCTION SHOULDER DISLOCATION     COLONOSCOPY W/ BIOPSIES AND POLYPECTOMY     COLONOSCOPY WITH PROPOFOL N/A 11/12/2018   Procedure: COLONOSCOPY WITH PROPOFOL;  Surgeon: Laurence Spates,  MD;  Location: [facility];  Service: Endoscopy;  Laterality: N/A;   CORONARY ANGIOPLASTY     diabetic ulcers     DIAGNOSTIC LAPAROSCOPY     DIALYSIS/PERMA CATHETER REMOVAL N/A 09/22/2019   Procedure: DIALYSIS/PERMA CATHETER REMOVAL;  Surgeon: Marty Heck, MD;  Location: [facility];  Service: Cardiovascular;  Laterality: N/A;   ESOPHAGOGASTRODUODENOSCOPY (EGD) WITH PROPOFOL N/A 11/12/2018   Procedure: ESOPHAGOGASTRODUODENOSCOPY (EGD) WITH PROPOFOL;  Surgeon: Laurence Spates, MD;  Location: [facility];  Service: Endoscopy;  Laterality: N/A;   GROIN EXPLORATION     HEMOSTASIS CLIP PLACEMENT  11/12/2018   Procedure: HEMOSTASIS CLIP PLACEMENT;  Surgeon: Laurence Spates, MD;  Location: [facility];  Service: Endoscopy;;   INSERT / REPLACE / [place] (AV) ARTEGRAFT ARM Left 03/18/2018   Procedure: INSERTION OF ARTERIOVENOUS (AV) FISTULA;  Surgeon: Rosetta Posner, MD;  Location: [facility];  Service: Vascular;  Laterality: Left;   IR FLUORO GUIDE CV LINE RIGHT  04/11/2019   IR US GUIDE VASC ACCESS RIGHT  04/11/2019   LEFT HEART CATH AND CORONARY ANGIOGRAPHY N/A 07/23/2019   Procedure: LEFT HEART CATH AND CORONARY ANGIOGRAPHY;  Surgeon: [name], Albert M, MD;  Location: [facility];  Service: Cardiovascular;  Laterality: N/A;   LIGATION ARTERIOVENOUS GORTEX GRAFT Right 03/18/2018   Procedure: LIGATION ARTERIOVENOUS FISTULA;  Surgeon: Rosetta Posner, MD;  Location: [facility] OR;  Service: Vascular;  Laterality: Right;   OSTECTOMY Left 04/23/2017   Procedure: OSTECTOMY LEFT GREAT TOE;  Surgeon: [name], DPM;  Location: [facility];  Service: Podiatry;  Laterality: Left;  left great toe   POLYPECTOMY  11/12/2018   Procedure: POLYPECTOMY;  Surgeon: Laurence Spates, MD;  Location: [facility];  Service: Endoscopy;;   TUMOR REMOVAL     small intestine x3   VISCERAL ANGIOGRAPHY N/A 08/02/2019   Procedure: VISCERAL ANGIOGRAPHY;  Surgeon: Waynetta Sandy, MD;  Location: [facility];  Service: Cardiovascular;  Laterality: N/A;     No Known Allergies    Family History  Problem Relation Age of Onset   Diabetes Mother    Alcohol abuse Father      Social History Mr. Decock reports that he quit smoking about 9 months ago. His smoking use included cigarettes. He started smoking about 52 years ago. He has a 40.00 pack-year smoking history. He has never used smokeless tobacco. Mr. Sopko reports previous alcohol use.   Review of Systems CONSTITUTIONAL: No weight loss, fever, chills, weakness or fatigue.  HEENT: Eyes: No visual loss, blurred vision, double vision or yellow sclerae.No hearing loss, sneezing, congestion, runny nose or sore throat.  SKIN: No rash or itching.  CARDIOVASCULAR: per hpi RESPIRATORY: per hpi GASTROINTESTINAL: No anorexia, nausea, vomiting or diarrhea. No abdominal pain or blood.  GENITOURINARY: No burning on urination, no polyuria NEUROLOGICAL: No headache, dizziness, syncope, paralysis, ataxia,  numbness or tingling in the extremities. No change in bowel or bladder control.  MUSCULOSKELETAL: No muscle, back pain, joint pain or stiffness.  LYMPHATICS: No enlarged nodes. No history of splenectomy.  PSYCHIATRIC: No history of depression or anxiety.  ENDOCRINOLOGIC: No reports of sweating, cold or heat intolerance. No polyuria or polydipsia.  Marland Kitchen   Physical Examination Today's Vitals   09/01/20 1347  BP: 130/76  Pulse: 60  SpO2: 98%  Weight: 208 lb 6.4 oz (94.5 kg)  Height: 6\' 4"  (1.93 Paul)   Body mass index is 25.37 kg/Paul.  Gen: resting comfortably, no acute distress HEENT: no scleral icterus, pupils equal round and reactive, no palptable cervical adenopathy,  CV: RRR, no Paul/rg, no jvd Resp: Clear to auscultation bilaterally GI: abdomen is soft, non-tender, non-distended, normal bowel sounds, no hepatosplenomegaly MSK: extremities are warm, no edema.  Skin: warm, no rash Neuro:  no  focal deficits Psych: appropriate affect   Diagnostic Studies  05/2001 Cath   FINDINGS:   1. Left main trunk: Medium caliber vessel. This is a long vessel with a   distal taper of 20-30%.   2. Left anterior descending artery: This is a medium caliber vessel that   provides a large bifurcating first diagonal [name] in the proximal segment   and before then extending to the apex, the LAD tapers significantly after   the diagonal [name]. There is moderate disease of 50-60% after the   diagonal [name] and then a focal eccentric narrowing of 60-70% in the mid   section of the LAD. The diagonal [name] has moderate disease of 30-40% in   the proximal segment. The lateral division is a small caliber vessel with   an ostial narrowing of 60%.   3. Left circumflex artery: This is a small caliber vessel that provides a   trivial first marginal [name] in the mid section and a medium caliber   second marginal [name] distally. The mid AV circumflex at the takeoff of   the first and second diagonal branches had moderate diffuse disease of   60-70%.   4. Right coronary artery: Dominant. This is a large caliber vessel that   provides a posterior descending artery and two posteroventricular branches   in the terminal segment. The right coronary artery has evidence of an   existing stent in the mid section that is widely patent. Prior to stent is   a focal discrete narrowing of 40-50%. Distal to the stent is moderate   disease of 30%. The distal [name] vessels also have mild disease of 30%.   5. Left ventricle: Normal end-systolic and end-diastolic dimensions. Overall   left ventricular function is well preserved. Ejection fraction is greater   than 65%. No mitral regurgitation. LV pressure is 180/10, aortic is   180/90. LV EDP equals 20.   ASSESSMENT AND PLAN: [name] is a 70 year old gentleman with moderate   three-vessel coronary artery disease. The patients plaque burden in the left   anterior  descending artery and circumflex appears to have increased somewhat   compared to his previous angiogram. Unfortunately, the mid and distal left   anterior descending artery is a small caliber vessel with a long diffuse   segment of disease and would be associated with a high restenosis rate.   Similarly, the circumflex would suffer the same fate.   Further assessment with a stress imaging study would be prudent to isolate the   area of ischemia. Percutaneous intervention may then be targeted if  indicated. In addition, aggressive medical therapy should be pursued as the   patient currently is not on any anti-anginal therapy and has poorly controlled   hypertension. The patient will also be counseled regarding smoking cessation.      07/2013 PFTs   Mild obstruction       10/2014 Lexiscan   Unable to adequately ambulate to treadmill due to right hip pain. Lexiscan employed.   Defect 1: There is a small defect of mild severity present in the mid inferior and apical inferior location. This defect is partially reversible.   This is a low risk study.   Nuclear stress EF: 58%.   Small region of apical inferior ischemia      07/2015 Echo Study Conclusions   - Left ventricle: The cavity size was normal. Wall thickness was   increased increased in a pattern of mild to moderate LVH.   Systolic function was normal. The estimated ejection fraction was   in the range of 60% to 65%. Diastolic function is abnormal,   indeterminate grade. Wall motion was normal; there were no   regional wall motion abnormalities. - Aortic valve: Mildly calcified annulus. Trileaflet; mildly   thickened leaflets. Valve area (VTI): 2.39 cm^2. Valve area   (Vmax): 2.58 cm^2. Valve area (Vmean): 2.7 cm^2. - Mitral valve: Mildly calcified annulus. Normal thickness leaflets   . - Technically adequate study.   08/2015 US aorta IMPRESSION: Mild aneurysmal dilatation of the distal abdominal aorta, 3.5 cm as well as  the right common iliac artery, 1.9 cm. Recommend followup by ultrasound in 2 years. This recommendation follows ACR consensus guidelines:      Jan 2019 nuclear stress T wave inversions in inferior leads and nonspecific T wave abnormalities in V6 seen throughout study. The study is normal. No myocardial ischemia or scar. This is a low risk study. Nuclear stress EF: 56%.     07/2017 event monitor 14 day event monitor Min HR 51, Max HR 115, Avg HR 69 Telemetry tracings show sinus rhythm and rate controlled atrial fibrillation Reported symptoms correlated with rate controlled afib     07/2019 cath Mid LM to Dist LM lesion is 25% stenosed. Mid LAD lesion is 40% stenosed. 1st Diag-1 lesion is 80% stenosed. 1st Diag-2 lesion is 90% stenosed. 1st Diag-3 lesion is 90% stenosed. Ost Cx to Prox Cx lesion is 75% stenosed. Mid Cx lesion is 90% stenosed. Prox RCA lesion is 35% stenosed. Dist RCA lesion is 50% stenosed. The left ventricular systolic function is normal. LV end diastolic pressure is normal. The left ventricular ejection fraction is 55-65% by visual estimate.   1. Severe 2 vessel obstructive CAD. The RCA and LAD disease is stable compared to 2003. There is progressive disease in a large diagonal [name] and the LCx    - the diagonal is a large bifurcating vessel.  It has multiple severe stenoses in the main [name] with severely calcified lesions    - 75% proximal LCx and 90% mid LCx. There is an acutely angulated take off from the left main and the vessel is severely calcified. 2. Normal LV function 3. Normal LVEDP   Plan: discussed with Dr [name]. The diagonal is poorly suited for PCI due to diffuse and heavily calcified disease. The LCx is complex and would require atherectomy. The acute angulation would make it technically difficult. For now would recommend aggressive medical therapy   Assessment and Plan  1. Paroxysmal aflutter/Afib/ Tachybrady syndrome -off anticoag due  to prior  GI bleeding No symptoms, continue current meds   2. HTN - issues with low bp's during HD - continue current meds   3. CAD   -occasional symptosm with low bp's during HD, no other times - continue current meds   4. Hyperlipidemia   -continue statin      Arnoldo Lenis, Paul.D.

## 2020-09-04 ENCOUNTER — Ambulatory Visit (INDEPENDENT_AMBULATORY_CARE_PROVIDER_SITE_OTHER): Payer: Medicare Other

## 2020-09-04 DIAGNOSIS — I495 Sick sinus syndrome: Secondary | ICD-10-CM

## 2020-09-05 ENCOUNTER — Telehealth: Payer: Self-pay | Admitting: Cardiology

## 2020-09-05 LAB — CUP PACEART REMOTE DEVICE CHECK
Battery Remaining Longevity: 156 mo
Battery Remaining Percentage: 100 %
Brady Statistic RA Percent Paced: 36 %
Brady Statistic RV Percent Paced: 1 %
Date Time Interrogation Session: 20220418044100
Implantable Lead Implant Date: 20190514
Implantable Lead Implant Date: 20190514
Implantable Lead Location: 753859
Implantable Lead Location: 753860
Implantable Lead Model: 7741
Implantable Lead Model: 7742
Implantable Lead Serial Number: 1015225
Implantable Lead Serial Number: 1020907
Implantable Pulse Generator Implant Date: 20190514
Lead Channel Impedance Value: 1091 Ohm
Lead Channel Impedance Value: 826 Ohm
Lead Channel Pacing Threshold Amplitude: 1.6 V
Lead Channel Pacing Threshold Pulse Width: 0.4 ms
Lead Channel Setting Pacing Amplitude: 2 V
Lead Channel Setting Pacing Amplitude: 2.5 V
Lead Channel Setting Pacing Pulse Width: 0.4 ms
Lead Channel Setting Sensing Sensitivity: 1.5 mV
Pulse Gen Serial Number: 832937

## 2020-09-05 NOTE — Telephone Encounter (Signed)
Amy from Dr. Beatris Si) called wants copy of full report from Carotid US. They got notes but wants copy of full report.She can be reached at [phone].

## 2020-09-05 NOTE — Progress Notes (Signed)
Pt has been treated with antibiotic prior.

## 2020-09-11 ENCOUNTER — Other Ambulatory Visit: Payer: Self-pay

## 2020-09-11 ENCOUNTER — Telehealth: Payer: Self-pay

## 2020-09-11 ENCOUNTER — Other Ambulatory Visit: Payer: Medicare Other

## 2020-09-11 DIAGNOSIS — R339 Retention of urine, unspecified: Secondary | ICD-10-CM

## 2020-09-11 DIAGNOSIS — N390 Urinary tract infection, site not specified: Secondary | ICD-10-CM

## 2020-09-11 LAB — MICROSCOPIC EXAMINATION
Renal Epithel, UA: NONE SEEN /hpf
WBC, UA: >30 /hpf — AB (ref 0–5)

## 2020-09-11 LAB — URINALYSIS, ROUTINE W REFLEX MICROSCOPIC
Bilirubin, UA: NEGATIVE
Glucose, UA: NEGATIVE
Ketones, UA: NEGATIVE
Nitrite, UA: NEGATIVE
Specific Gravity, UA: 1.015 (ref 1.005–1.030)
Urobilinogen, Ur: 0.2 mg/dL (ref 0.2–1.0)
pH, UA: 5.5 (ref 5.0–7.5)

## 2020-09-11 NOTE — Telephone Encounter (Signed)
Called pts wife and let her know pts urine was sent for a culture.

## 2020-09-11 NOTE — Telephone Encounter (Signed)
Pts wife called saying she spoke with A.L. RN last week and she is to leave a urine specimen of pts to make sure infection is gone. Bringing in today.

## 2020-09-14 NOTE — Progress Notes (Signed)
Pt made aware of this.  

## 2020-09-15 ENCOUNTER — Telehealth (INDEPENDENT_AMBULATORY_CARE_PROVIDER_SITE_OTHER): Payer: Medicare Other | Admitting: Urology

## 2020-09-15 ENCOUNTER — Encounter: Payer: Self-pay | Admitting: Urology

## 2020-09-15 ENCOUNTER — Telehealth: Payer: Self-pay

## 2020-09-15 DIAGNOSIS — I6523 Occlusion and stenosis of bilateral carotid arteries: Secondary | ICD-10-CM | POA: Diagnosis not present

## 2020-09-15 DIAGNOSIS — N390 Urinary tract infection, site not specified: Secondary | ICD-10-CM

## 2020-09-15 DIAGNOSIS — N401 Enlarged prostate with lower urinary tract symptoms: Secondary | ICD-10-CM

## 2020-09-15 DIAGNOSIS — N138 Other obstructive and reflux uropathy: Secondary | ICD-10-CM

## 2020-09-15 LAB — URINE CULTURE

## 2020-09-15 MED ORDER — NITROFURANTOIN MONOHYD MACRO 100 MG PO CAPS: 100.0000 mg | ORAL_CAPSULE | Freq: Two times a day (BID) | ORAL | 0 refills | Status: DC

## 2020-09-15 MED ORDER — NITROFURANTOIN MONOHYD MACRO 100 MG PO CAPS: 100.0000 mg | ORAL_CAPSULE | Freq: Every day | ORAL | 1 refills | Status: DC

## 2020-09-15 NOTE — Telephone Encounter (Signed)
-----   Message from Cleon Gustin, MD sent at 09/15/2020  8:42 AM EDT ----- Macrobid 100mg  BID for 7 days ----- Message ----- From: Iris Pert, LPN Sent: [id number]   8:06 AM EDT To: Cleon Gustin, MD  Please review Patient on maintenance dose of Macrodantin 50

## 2020-09-15 NOTE — Telephone Encounter (Signed)
Rx sent in. Wife called and made aware to stop maintenance dose while on 7 day bid dose and to start back nightly dose when 7 day dose is completed. Wife voiced understanding.

## 2020-09-15 NOTE — Progress Notes (Signed)
09/15/2020 11:47 AM   [name] 05-Jun-1950 [id number]  Referring provider: Tobe Sos, MD [address] [place] [address]   Patient location: home Physician location: office I connected with  [name] on 09/15/20 by a video enabled telemedicine application and verified that I am speaking with the correct person using two identifiers.   I discussed the limitations of evaluation and management by telemedicine. The patient expressed understanding and agreed to proceed.    followup recurrent UTI  HPI: [name] is a 70yo her for followup for recurrent UTI. Urine culture grew klebsiella sensitive to macrobid. He is currently on macrobid 50mg  qhs. No worsening. LUTS.   PMH: Past Medical History:  Diagnosis Date  . AAA (abdominal aortic aneurysm) ([place]) 06/2016   Mild increase in size of 3.4 cm infrarenal abdominal aortic aneurysm  . Anemia   . Aortic atherosclerosis ([place])   . Asthma   . Atrial flutter ([place])   . AVF (arteriovenous fistula) ([facility])    Left forearm  . CAD (coronary artery disease)    a. s/p prior stenting of RCA in 1999 b. cath in 2003 showing moderate CAD c. NST in 2016 showing small area of ischemic and low-risk  . CHF (congestive heart failure) ([place])   . CKD (chronic kidney disease), stage V ([place])    kidney transplant evaluation  . COPD (chronic obstructive pulmonary disease) ([place])    with ongoing tobacco use and patient failed sham takes  . Diverticulosis 2018   Mild sigmoid colon diverticulosis.  Marland Kitchen DM2 (diabetes mellitus, type 2) ([place])   . Dyslipidemia   . Dysrhythmia 2018   atrial fibrillation  . Edema    chronic lower extremity secondary to right heart failure and chronic venous insufficiency  . Fatigue   . Fatty liver 2010   Mild  . Headache   . HTN (hypertension)   . Morbid obesity ([place])   . Multiple pulmonary nodules 05/2018   Bilateral  . Pneumonia   . Presence of permanent cardiac pacemaker   . 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   Marland Kitchen Reflux esophagitis    hx  . Sleep apnea    PT STATES ONE TEST WAS POSITIVE AND ANOTHER TEST WAS NEGATIVE  . Two-vessel coronary artery disease    moderate. by cath in 2003. Status post stenting of the mdi RCA November 1999 normal left ventricular ejection fraction  . Wears glasses   . Wears hearing aid    B/L    Surgical History: Past Surgical History:  Procedure Laterality Date  . A/V FISTULAGRAM Left 04/30/2019   Procedure: A/V FISTULAGRAM;  Surgeon: Elam Dutch, MD;  Location: [facility];  Service: Cardiovascular;  Laterality: Left;  . A/V FISTULAGRAM Left 09/22/2019   Procedure: A/V FISTULAGRAM;  Surgeon: Marty Heck, MD;  Location: [facility];  Service: Cardiovascular;  Laterality: Left;  . ABDOMINAL SURGERY    . APPENDECTOMY    . AV FISTULA PLACEMENT Right 03/11/2018   Procedure: CREATION Brachiocephalic Fistula RIGHT ARM;  Surgeon: Rosetta Posner, MD;  Location: [facility];  Service: Vascular;  Laterality: Right;  . AV FISTULA PLACEMENT Left 05/06/2019   Procedure: LEFT BRACHIOCEPHALIC ARTERIOVENOUS (AV) FISTULA CREATION;  Surgeon: Marty Heck, MD;  Location: [facility];  Service: Vascular;  Laterality: Left;  . BACK SURGERY     x3; cages in patient's back since 2000  .  BIOPSY  11/12/2018   Procedure: BIOPSY;  Surgeon: Laurence Spates, MD;  Location: [facility];  Service: Endoscopy;;  . CARDIAC CATHETERIZATION     stent  . CHOLECYSTECTOMY    . CLOSED REDUCTION SHOULDER DISLOCATION    . COLONOSCOPY W/ BIOPSIES AND POLYPECTOMY    . COLONOSCOPY WITH PROPOFOL N/A 11/12/2018   Procedure: COLONOSCOPY WITH PROPOFOL;  Surgeon: Laurence Spates, MD;  Location: [facility];  Service: Endoscopy;  Laterality: N/A;  . CORONARY ANGIOPLASTY    . diabetic ulcers    . DIAGNOSTIC LAPAROSCOPY    . DIALYSIS/PERMA CATHETER REMOVAL N/A 09/22/2019   Procedure: DIALYSIS/PERMA CATHETER  REMOVAL;  Surgeon: Marty Heck, MD;  Location: [facility];  Service: Cardiovascular;  Laterality: N/A;  . ESOPHAGOGASTRODUODENOSCOPY (EGD) WITH PROPOFOL N/A 11/12/2018   Procedure: ESOPHAGOGASTRODUODENOSCOPY (EGD) WITH PROPOFOL;  Surgeon: Laurence Spates, MD;  [facility]: [facility];  Service: Endoscopy;  Laterality: N/A;  . GROIN EXPLORATION    . HEMOSTASIS CLIP PLACEMENT  11/12/2018   Procedure: HEMOSTASIS CLIP PLACEMENT;  Surgeon: Laurence Spates, MD;  Location: [facility];  Service: Endoscopy;;  . INSERT / REPLACE / REMOVE PACEMAKER    . INSERTION OF ARTERIOVENOUS (AV) ARTEGRAFT ARM Left 03/18/2018   Procedure: INSERTION OF ARTERIOVENOUS (AV) FISTULA;  Surgeon: Rosetta Posner, MD;  Location: [facility];  Service: Vascular;  Laterality: Left;  . IR FLUORO GUIDE CV LINE RIGHT  04/11/2019  . IR US GUIDE VASC ACCESS RIGHT  04/11/2019  . LEFT HEART CATH AND CORONARY ANGIOGRAPHY N/A 07/23/2019   Procedure: LEFT HEART CATH AND CORONARY ANGIOGRAPHY;  Surgeon: Martinique, Peter M, MD;  Location: [facility];  Service: Cardiovascular;  Laterality: N/A;  . LIGATION ARTERIOVENOUS GORTEX GRAFT Right 03/18/2018   Procedure: LIGATION ARTERIOVENOUS FISTULA;  Surgeon: Rosetta Posner, MD;  Location: [facility];  Service: Vascular;  Laterality: Right;  . OSTECTOMY Left 04/23/2017   Procedure: OSTECTOMY LEFT GREAT TOE;  Surgeon: [name], DPM;  Location: [facility];  Service: Podiatry;  Laterality: Left;  left great toe  . POLYPECTOMY  11/12/2018   Procedure: POLYPECTOMY;  Surgeon: Laurence Spates, MD;  Location: [facility];  Service: Endoscopy;;  . TUMOR REMOVAL     small intestine x3  . VISCERAL ANGIOGRAPHY N/A 08/02/2019   Procedure: VISCERAL ANGIOGRAPHY;  Surgeon: Waynetta Sandy, MD;  Location: [facility];  Service: Cardiovascular;  Laterality: N/A;    Home Medications:  Allergies as of 09/15/2020   No Known Allergies     Medication List       Accurate as of September 15, 2020 11:47 AM. If you have any questions, ask your nurse or doctor.        albuterol 108 (90 Base) MCG/ACT inhaler Commonly known as: VENTOLIN HFA Inhale 2 puffs into the lungs every 6 (six) hours as needed for wheezing or shortness of breath.   aspirin 81 MG EC tablet Take 1 tablet (81 mg total) by mouth daily.   atorvastatin 40 MG tablet Commonly known as: LIPITOR Take 1 tablet by mouth once daily   beclomethasone 80 MCG/ACT inhaler Commonly known as: QVAR Inhale 2 puffs into the lungs 2 (two) times daily as needed (shortness of breath).   bumetanide 2 MG tablet Commonly known as: BUMEX Take 2 mg by mouth 2 (two) times daily.   buPROPion 150 MG 12 hr tablet Commonly known as: WELLBUTRIN SR Take 150 mg by mouth 2 (two) times daily.   carvedilol 25 MG tablet Commonly  known as: COREG Take 1 tablet (25 mg total) by mouth See admin instructions. Take 25 mg twice daily on Sun, Mon, Wed, and Fri. Take 25 mg in the evening on Tues, Thurs, and Sat (dialysis days)   denosumab 60 MG/ML Sosy injection Commonly known as: PROLIA Inject 60 mg into the skin every 6 (six) months.   Docusate Sodium 50 MG/15ML Liqd 2 capsule as needed   lactulose 10 GM/15ML solution Commonly known as: CHRONULAC Take 20 g by mouth 2 (two) times daily as needed for mild constipation.   linaclotide 290 MCG Caps capsule Commonly known as: LINZESS Take 290 mcg by mouth daily as needed (constipation).   magnesium oxide 400 MG tablet Commonly known as: MAG-OX Take 400 mg by mouth daily.   Melatonin 10 MG Tabs Take 10 mg by mouth at bedtime as needed (sleep).   multivitamin Tabs tablet Take 1 tablet by mouth daily.   nitrofurantoin (macrocrystal-monohydrate) 100 MG capsule Commonly known as: MACROBID Take 1 capsule (100 mg total) by mouth every 12 (twelve) hours. What changed: Another medication with the same name was added. Make sure you understand how and when to take each. Changed by: Hope Cobb,  LPN   nitrofurantoin (macrocrystal-monohydrate) 100 MG capsule Commonly known as: MACROBID Take 1 capsule (100 mg total) by mouth 2 (two) times daily. What changed: You were already taking a medication with the same name, and this prescription was added. Make sure you understand how and when to take each. Changed by: Hope Cobb, LPN   nitrofurantoin 50 MG capsule Commonly known as: MACRODANTIN Take 1 capsule (50 mg total) by mouth at bedtime.   nitroGLYCERIN 0.4 MG SL tablet Commonly known as: NITROSTAT Place 1 tablet (0.4 mg total) under the tongue every 5 (five) minutes as needed for chest pain.   omeprazole 20 MG capsule Commonly known as: PRILOSEC Take 20 mg by mouth daily.   polyethylene glycol 17 g packet Commonly known as: MIRALAX / GLYCOLAX Take 17 g by mouth daily as needed for mild constipation. Mix in morning coffee   risperiDONE 0.5 MG tablet Commonly known as: RISPERDAL Take 0.5 mg by mouth at bedtime.   ROPINIROLE HCL PO Take 0.25 mg by mouth at bedtime.   Sennosides 25 MG Tabs Take 50 mg by mouth daily as needed (constipation).   sildenafil 100 MG tablet Commonly known as: VIAGRA Take 100 mg by mouth daily.   solifenacin 5 MG tablet Commonly known as: VESICARE Take 1 tablet (5 mg total) by mouth daily.   tadalafil 20 MG tablet Commonly known as: CIALIS Take 1 tablet (20 mg total) by mouth daily as needed.   tamsulosin 0.4 MG Caps capsule Commonly known as: FLOMAX Take 1 capsule (0.4 mg total) by mouth daily after supper.   tiZANidine 2 MG tablet Commonly known as: ZANAFLEX Take 2 mg by mouth at bedtime.   traMADol 50 MG tablet Commonly known as: ULTRAM Take by mouth every 6 (six) hours as needed.   Vitamin D3 50 MCG (2000 UT) Tabs Take 2,000 Units by mouth daily.       Allergies: No Known Allergies  Family History: Family History  Problem Relation Age of Onset  . Diabetes Mother   . Alcohol abuse Father     Social History:   reports that he quit smoking about 9 months ago. His smoking use included cigarettes. He started smoking about 52 years ago. He has a 40.00 pack-year smoking history. He has never used smokeless  tobacco. He reports previous alcohol use. He reports that he does not use drugs.  ROS: All other review of systems were reviewed and are negative except what is noted above in HPI  Physical Exam: There were no vitals taken for this visit.  Constitutional:  Alert and oriented, No acute distress. HEENT: [place] AT, moist mucus membranes.  Trachea midline, no masses. Cardiovascular: No clubbing, cyanosis, or edema. Respiratory: Normal respiratory effort, no increased work of breathing. GI: Abdomen is soft, nontender, nondistended, no abdominal masses GU: No CVA tenderness.  Lymph: No cervical or inguinal lymphadenopathy. Skin: No rashes, bruises or suspicious lesions. Neurologic: Grossly intact, no focal deficits, moving all 4 extremities. Psychiatric: Normal mood and affect.  Laboratory Data: Lab Results  Component Value Date   WBC 7.2 11/23/2019   HGB 14.4 11/23/2019   HCT 43.9 11/23/2019   MCV 101.4 (H) 11/23/2019   PLT 193 11/23/2019    Lab Results  Component Value Date   CREATININE 4.72 (H) 11/23/2019    No results found for: PSA  Lab Results  Component Value Date   TESTOSTERONE 297 07/28/2015    Lab Results  Component Value Date   HGBA1C 5.8 (H) 04/14/2017    Urinalysis    Component Value Date/Time   COLORURINE YELLOW 07/22/2019 1625   APPEARANCEUR Hazy (A) 09/11/2020 1411   LABSPEC 1.010 07/22/2019 1625   PHURINE 6.0 07/22/2019 1625   GLUCOSEU Negative 09/11/2020 1411   HGBUR NEGATIVE 07/22/2019 1625   BILIRUBINUR Negative 09/11/2020 1411   KETONESUR NEGATIVE 07/22/2019 1625   PROTEINUR 2+ (A) 09/11/2020 1411   PROTEINUR >=300 (A) 07/22/2019 1625   UROBILINOGEN 0.2 09/27/2014 0027   NITRITE Negative 09/11/2020 1411   NITRITE NEGATIVE 07/22/2019 1625   LEUKOCYTESUR  3+ (A) 09/11/2020 1411   LEUKOCYTESUR NEGATIVE 07/22/2019 1625    Lab Results  Component Value Date   LABMICR See below: 09/11/2020   WBCUA >30 (A) 09/11/2020   LABEPIT 0-10 09/11/2020   MUCUS Present 09/11/2020   BACTERIA Many (A) 09/11/2020    Pertinent Imaging:  No results found for this or any previous visit.  No results found for this or any previous visit.  No results found for this or any previous visit.  No results found for this or any previous visit.  Results for orders placed during the hospital encounter of 04/04/16  US Renal  Narrative CLINICAL DATA:  Hematuria.  EXAM: RENAL / URINARY TRACT ULTRASOUND COMPLETE  COMPARISON:  09/13/2015  FINDINGS: Right Kidney:  Length: 13.0 cm. Echogenicity within normal limits. No mass or hydronephrosis visualized.  Left Kidney:  Length: 13.8 cm. Echogenicity is normal. Suspect mild renal parenchymal thinning. In the mid aspect of the left renal pelvis and may represent a vascular calcification or intrarenal stone.  Bladder:  Appears normal for degree of bladder distention.  IMPRESSION: 1. No hydronephrosis. 2. Vascular calcification versus intrarenal calculus in the mid pole region of the left kidney. 3. Mild renal parenchymal thinning of the left kidney.   Electronically Signed By: Nolon Nations M.D. On: 04/05/2016 15:42  No results found for this or any previous visit.  No results found for this or any previous visit.  No results found for this or any previous visit.   Assessment & Plan:    1. Recurrent UTI -Renal US  2. Benign prostatic hyperplasia with urinary obstruction -Continue flomax 0.4mg  daily   No follow-ups on file.  Nicolette Bang, MD  [facility] [place]

## 2020-09-15 NOTE — Patient Instructions (Signed)
Urinary Tract Infection, Adult  A urinary tract infection (UTI) is an infection of any part of the urinary tract. The urinary tract includes the kidneys, ureters, bladder, and urethra. These organs make, store, and get rid of urine in the body. An upper UTI affects the ureters and kidneys. A lower UTI affects the bladder and urethra. What are the causes? Most urinary tract infections are caused by bacteria in your genital area around your urethra, where urine leaves your body. These bacteria grow and cause inflammation of your urinary tract. What increases the risk? You are more likely to develop this condition if:  You have a urinary catheter that stays in place.  You are not able to control when you urinate or have a bowel movement (incontinence).  You are male and you: ? Use a spermicide or diaphragm for birth control. ? Have low estrogen levels. ? Are pregnant.  You have certain genes that increase your risk.  You are sexually active.  You take antibiotic medicines.  You have a condition that causes your flow of urine to slow down, such as: ? An enlarged prostate, if you are male. ? Blockage in your urethra. ? A kidney stone. ? A nerve condition that affects your bladder control (neurogenic bladder). ? Not getting enough to drink, or not urinating often.  You have certain medical conditions, such as: ? Diabetes. ? A weak disease-fighting system (immunesystem). ? Sickle cell disease. ? Gout. ? Spinal cord injury. What are the signs or symptoms? Symptoms of this condition include:  Needing to urinate right away (urgency).  Frequent urination. This may include small amounts of urine each time you urinate.  Pain or burning with urination.  Blood in the urine.  Urine that smells bad or unusual.  Trouble urinating.  Cloudy urine.  Vaginal discharge, if you are male.  Pain in the abdomen or the lower back. You may also have:  Vomiting or a decreased  appetite.  Confusion.  Irritability or tiredness.  A fever or chills.  Diarrhea. The first symptom in older adults may be confusion. In some cases, they may not have any symptoms until the infection has worsened. How is this diagnosed? This condition is diagnosed based on your medical history and a physical exam. You may also have other tests, including:  Urine tests.  Blood tests.  Tests for STIs (sexually transmitted infections). If you have had more than one UTI, a cystoscopy or imaging studies may be done to determine the cause of the infections. How is this treated? Treatment for this condition includes:  Antibiotic medicine.  Over-the-counter medicines to treat discomfort.  Drinking enough water to stay hydrated. If you have frequent infections or have other conditions such as a kidney stone, you may need to see a health care provider who specializes in the urinary tract (urologist). In rare cases, urinary tract infections can cause sepsis. Sepsis is a life-threatening condition that occurs when the body responds to an infection. Sepsis is treated in the hospital with IV antibiotics, fluids, and other medicines. Follow these instructions at home: Medicines  Take over-the-counter and prescription medicines only as told by your health care provider.  If you were prescribed an antibiotic medicine, take it as told by your health care provider. Do not stop using the antibiotic even if you start to feel better. General instructions  Make sure you: ? Empty your bladder often and completely. Do not hold urine for long periods of time. ? Empty your bladder after   sex. ? Wipe from front to back after urinating or having a bowel movement if you are male. Use each tissue only one time when you wipe.  Drink enough fluid to keep your urine pale yellow.  Keep all follow-up visits. This is important.   Contact a health care provider if:  Your symptoms do not get better after 1-2  days.  Your symptoms go away and then return. Get help right away if:  You have severe pain in your back or your lower abdomen.  You have a fever or chills.  You have nausea or vomiting. Summary  A urinary tract infection (UTI) is an infection of any part of the urinary tract, which includes the kidneys, ureters, bladder, and urethra.  Most urinary tract infections are caused by bacteria in your genital area.  Treatment for this condition often includes antibiotic medicines.  If you were prescribed an antibiotic medicine, take it as told by your health care provider. Do not stop using the antibiotic even if you start to feel better.  Keep all follow-up visits. This is important. This information is not intended to replace advice given to you by your health care provider. Make sure you discuss any questions you have with your health care provider. Document Revised: 12/17/2019 Document Reviewed: 12/17/2019 Elsevier Patient Education  [place]. Benign Prostatic Hyperplasia  Benign prostatic hyperplasia (BPH) is an enlarged prostate gland that is caused by the normal aging process and not by cancer. The prostate is a walnut-sized gland that is involved in the production of semen. It is located in front of the rectum and below the bladder. The bladder stores urine and the urethra is the tube that carries the urine out of the body. The prostate may get bigger as a man gets older. An enlarged prostate can press on the urethra. This can make it harder to pass urine. The build-up of urine in the bladder can cause infection. Back pressure and infection may progress to bladder damage and kidney (renal) failure. What are the causes? This condition is part of a normal aging process. However, not all men develop problems from this condition. If the prostate enlarges away from the urethra, urine flow will not be blocked. If it enlarges toward the urethra and compresses it, there will be  problems passing urine. What increases the risk? This condition is more likely to develop in men over the age of 43 years. What are the signs or symptoms? Symptoms of this condition include:  Getting up often during the night to urinate.  Needing to urinate frequently during the day.  Difficulty starting urine flow.  Decrease in size and strength of your urine stream.  Leaking (dribbling) after urinating.  Inability to pass urine. This needs immediate treatment.  Inability to completely empty your bladder.  Pain when you pass urine. This is more common if there is also an infection.  Urinary tract infection (UTI). How is this diagnosed? This condition is diagnosed based on your medical history, a physical exam, and your symptoms. Tests will also be done, such as:  A post-void bladder scan. This measures any amount of urine that may remain in your bladder after you finish urinating.  A digital rectal exam. In a rectal exam, your health care provider checks your prostate by putting a lubricated, gloved finger into your rectum to feel the back of your prostate gland. This exam detects the size of your gland and any abnormal lumps or growths.  An exam  of your urine (urinalysis).  A prostate specific antigen (PSA) screening. This is a blood test used to screen for prostate cancer.  An ultrasound. This test uses sound waves to electronically produce a picture of your prostate gland. Your health care provider may refer you to a specialist in kidney and prostate diseases (urologist). How is this treated? Once symptoms begin, your health care provider will monitor your condition (active surveillance or watchful waiting). Treatment for this condition will depend on the severity of your condition. Treatment may include:  Observation and yearly exams. This may be the only treatment needed if your condition and symptoms are mild.  Medicines to relieve your symptoms,  including: ? Medicines to shrink the prostate. ? Medicines to relax the muscle of the prostate.  Surgery in severe cases. Surgery may include: ? Prostatectomy. In this procedure, the prostate tissue is removed completely through an open incision or with a laparoscope or robotics. ? Transurethral resection of the prostate (TURP). In this procedure, a tool is inserted through the opening at the tip of the penis (urethra). It is used to cut away tissue of the inner core of the prostate. The pieces are removed through the same opening of the penis. This removes the blockage. ? Transurethral incision (TUIP). In this procedure, small cuts are made in the prostate. This lessens the prostate's pressure on the urethra. ? Transurethral microwave thermotherapy (TUMT). This procedure uses microwaves to create heat. The heat destroys and removes a small amount of prostate tissue. ? Transurethral needle ablation (TUNA). This procedure uses radio frequencies to destroy and remove a small amount of prostate tissue. ? Interstitial laser coagulation ([place]). This procedure uses a laser to destroy and remove a small amount of prostate tissue. ? Transurethral electrovaporization (TUVP). This procedure uses electrodes to destroy and remove a small amount of prostate tissue. ? Prostatic urethral lift. This procedure inserts an implant to push the lobes of the prostate away from the urethra. Follow these instructions at home:  Take over-the-counter and prescription medicines only as told by your health care provider.  Monitor your symptoms for any changes. Contact your health care provider with any changes.  Avoid drinking large amounts of liquid before going to bed or out in public.  Avoid or reduce how much caffeine or alcohol you drink.  Give yourself time when you urinate.  Keep all follow-up visits as told by your health care provider. This is important. Contact a health care provider if:  You have  unexplained back pain.  Your symptoms do not get better with treatment.  You develop side effects from the medicine you are taking.  Your urine becomes very dark or has a bad smell.  Your lower abdomen becomes distended and you have trouble passing your urine. Get help right away if:  You have a fever or chills.  You suddenly cannot urinate.  You feel lightheaded, or very dizzy, or you faint.  There are large amounts of blood or clots in the urine.  Your urinary problems become hard to manage.  You develop moderate to severe low back or flank pain. The flank is the side of your body between the ribs and the hip. These symptoms may represent a serious problem that is an emergency. Do not wait to see if the symptoms will go away. Get medical help right away. Call your local emergency services (911 in the [place] Do not drive yourself to the hospital. Summary  Benign prostatic hyperplasia (BPH) is an enlarged  prostate that is caused by the normal aging process and not by cancer.  An enlarged prostate can press on the urethra. This can make it hard to pass urine.  This condition is part of a normal aging process and is more likely to develop in men over the age of 9 years.  Get help right away if you suddenly cannot urinate. This information is not intended to replace advice given to you by your health care provider. Make sure you discuss any questions you have with your health care provider. Document Revised: 01/13/2020 Document Reviewed: 01/13/2020 Elsevier Patient Education  [place].

## 2020-09-18 ENCOUNTER — Other Ambulatory Visit: Payer: Self-pay

## 2020-09-18 ENCOUNTER — Ambulatory Visit (HOSPITAL_COMMUNITY)
Admission: RE | Admit: 2020-09-18 | Discharge: 2020-09-18 | Disposition: A | Discharge status: Home / Self Care | Payer: Medicare Other | Source: Ambulatory Visit | Attending: Urology | Admitting: Urology

## 2020-09-18 ENCOUNTER — Telehealth: Payer: Self-pay

## 2020-09-18 DIAGNOSIS — N390 Urinary tract infection, site not specified: Secondary | ICD-10-CM | POA: Insufficient documentation

## 2020-09-19 ENCOUNTER — Telehealth: Payer: Self-pay

## 2020-09-19 DIAGNOSIS — N390 Urinary tract infection, site not specified: Secondary | ICD-10-CM

## 2020-09-19 NOTE — Progress Notes (Signed)
Remote pacemaker transmission.   

## 2020-09-20 ENCOUNTER — Other Ambulatory Visit: Payer: Self-pay

## 2020-09-20 MED ORDER — CEFUROXIME AXETIL 500 MG PO TABS: 500.0000 mg | ORAL_TABLET | Freq: Two times a day (BID) | ORAL | 0 refills | Status: AC

## 2020-09-20 NOTE — Telephone Encounter (Signed)
Wife called and made aware of new antibiotic sent in. Wife states that husband has decreased urine output. Wife made aware that afternoon clinic is closed and patient will need to go to ER if he is unable to void. Wife voiced understanding.

## 2020-09-21 ENCOUNTER — Emergency Department (HOSPITAL_COMMUNITY)
Admission: EM | Admit: 2020-09-21 | Discharge: 2020-09-21 | Disposition: A | Discharge status: Home / Self Care | Payer: Medicare Other | Attending: Emergency Medicine | Admitting: Emergency Medicine

## 2020-09-21 ENCOUNTER — Other Ambulatory Visit: Payer: Self-pay

## 2020-09-21 ENCOUNTER — Telehealth: Payer: Self-pay

## 2020-09-21 ENCOUNTER — Encounter (HOSPITAL_COMMUNITY): Payer: Self-pay | Admitting: Emergency Medicine

## 2020-09-21 DIAGNOSIS — R300 Dysuria: Secondary | ICD-10-CM | POA: Insufficient documentation

## 2020-09-21 DIAGNOSIS — I5032 Chronic diastolic (congestive) heart failure: Secondary | ICD-10-CM | POA: Insufficient documentation

## 2020-09-21 DIAGNOSIS — J449 Chronic obstructive pulmonary disease, unspecified: Secondary | ICD-10-CM | POA: Diagnosis not present

## 2020-09-21 DIAGNOSIS — Z992 Dependence on renal dialysis: Secondary | ICD-10-CM | POA: Diagnosis not present

## 2020-09-21 DIAGNOSIS — Z87891 Personal history of nicotine dependence: Secondary | ICD-10-CM | POA: Insufficient documentation

## 2020-09-21 DIAGNOSIS — E1122 Type 2 diabetes mellitus with diabetic chronic kidney disease: Secondary | ICD-10-CM | POA: Diagnosis not present

## 2020-09-21 DIAGNOSIS — I2510 Atherosclerotic heart disease of native coronary artery without angina pectoris: Secondary | ICD-10-CM | POA: Insufficient documentation

## 2020-09-21 DIAGNOSIS — I132 Hypertensive heart and chronic kidney disease with heart failure and with stage 5 chronic kidney disease, or end stage renal disease: Secondary | ICD-10-CM | POA: Insufficient documentation

## 2020-09-21 DIAGNOSIS — Z7982 Long term (current) use of aspirin: Secondary | ICD-10-CM | POA: Insufficient documentation

## 2020-09-21 DIAGNOSIS — R339 Retention of urine, unspecified: Secondary | ICD-10-CM | POA: Insufficient documentation

## 2020-09-21 DIAGNOSIS — J45909 Unspecified asthma, uncomplicated: Secondary | ICD-10-CM | POA: Diagnosis not present

## 2020-09-21 DIAGNOSIS — N186 End stage renal disease: Secondary | ICD-10-CM | POA: Insufficient documentation

## 2020-09-21 LAB — CBC WITH DIFFERENTIAL/PLATELET
Abs Immature Granulocytes: 0.21 10*3/uL — ABNORMAL HIGH (ref 0.00–0.07)
Basophils Absolute: 0.1 10*3/uL (ref 0.0–0.1)
Basophils Relative: 1 %
Eosinophils Absolute: 0 10*3/uL (ref 0.0–0.5)
Eosinophils Relative: 0 %
HCT: 40.2 % (ref 39.0–52.0)
Hemoglobin: 13.1 g/dL (ref 13.0–17.0)
Immature Granulocytes: 2 %
Lymphocytes Relative: 13 %
Lymphs Abs: 1.3 10*3/uL (ref 0.7–4.0)
MCH: 35.6 pg — ABNORMAL HIGH (ref 26.0–34.0)
MCHC: 32.6 g/dL (ref 30.0–36.0)
MCV: 109.2 fL — ABNORMAL HIGH (ref 80.0–100.0)
Monocytes Absolute: 1.1 10*3/uL — ABNORMAL HIGH (ref 0.1–1.0)
Monocytes Relative: 11 %
Neutro Abs: 7.4 10*3/uL (ref 1.7–7.7)
Neutrophils Relative %: 73 %
Platelets: 129 10*3/uL — ABNORMAL LOW (ref 150–400)
RBC: 3.68 MIL/uL — ABNORMAL LOW (ref 4.22–5.81)
RDW: 13.9 % (ref 11.5–15.5)
WBC: 10 10*3/uL (ref 4.0–10.5)
nRBC: 0 % (ref 0.0–0.2)

## 2020-09-21 LAB — BASIC METABOLIC PANEL
Anion gap: 9 (ref 5–15)
BUN: 66 mg/dL — ABNORMAL HIGH (ref 8–23)
CO2: 29 mmol/L (ref 22–32)
Calcium: 9.2 mg/dL (ref 8.9–10.3)
Chloride: 92 mmol/L — ABNORMAL LOW (ref 98–111)
Creatinine, Ser: 7.23 mg/dL — ABNORMAL HIGH (ref 0.61–1.24)
GFR, Estimated: 8 mL/min — ABNORMAL LOW (ref >60)
Glucose, Bld: 117 mg/dL — ABNORMAL HIGH (ref 70–99)
Potassium: 3 mmol/L — ABNORMAL LOW (ref 3.5–5.1)
Sodium: 130 mmol/L — ABNORMAL LOW (ref 135–145)

## 2020-09-21 NOTE — ED Triage Notes (Signed)
Pt reports does peritoneal dialysis at home and reports decreased urinary output and "fluid being pulled off" for last several days. Pt reports dry mouth, weakness.

## 2020-09-21 NOTE — Telephone Encounter (Signed)
Patient currently in ER. Dr. Alyson Ingles aware

## 2020-09-21 NOTE — Discharge Instructions (Addendum)
Follow-up with Dr. Alyson Ingles Monday morning.  Increase your Flomax so you are taking it twice a day.  The other name  for flomax is tamsulosin.   Let your dialysis doctor know that we did blood work on your kidney function so they can check it.

## 2020-09-21 NOTE — ED Provider Notes (Signed)
[facility] EMERGENCY DEPARTMENT Provider Note   CSN: [id number] Arrival date & time: 09/21/20  1026     History Chief Complaint  Patient presents with  . Urinary Retention    [name] is a 70 y.o. male.  Patient is a dialysis patient.  He has been having difficulty peeing and some discomfort with urination the last day.  He saw his urologist yesterday who started him on Ceftin  The history is provided by the patient and a relative. No language interpreter was used.  Dysuria Presenting symptoms: dysuria   Context: during urination   Context: not after injury   Relieved by:  Nothing Worsened by:  Nothing Ineffective treatments:  None tried Associated symptoms: no abdominal pain, no diarrhea, no hematuria and no urinary frequency   Risk factors: no bladder surgery        Past Medical History:  Diagnosis Date  . AAA (abdominal aortic aneurysm) ([place]) 06/2016   Mild increase in size of 3.4 cm infrarenal abdominal aortic aneurysm  . Anemia   . Aortic atherosclerosis ([place])   . Asthma   . Atrial flutter ([place])   . AVF (arteriovenous fistula) (HCC)    Left forearm  . CAD (coronary artery disease)    a. s/p prior stenting of RCA in 1999 b. cath in 2003 showing moderate CAD c. NST in 2016 showing small area of ischemic and low-risk  . CHF (congestive heart failure) ([place])   . CKD (chronic kidney disease), stage V ([place])    kidney transplant evaluation  . COPD (chronic obstructive pulmonary disease) ([place])    with ongoing tobacco use and patient failed sham takes  . Diverticulosis 2018   Mild sigmoid colon diverticulosis.  Marland Kitchen DM2 (diabetes mellitus, type 2) ([place])   . Dyslipidemia   . Dysrhythmia 2018   atrial fibrillation  . Edema    chronic lower extremity secondary to right heart failure and chronic venous insufficiency  . Fatigue   . Fatty liver 2010   Mild  . Headache   . HTN (hypertension)   . Morbid obesity ([place])   . Multiple pulmonary nodules 05/2018    Bilateral  . Pneumonia   . Presence of permanent cardiac pacemaker   . 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   Marland Kitchen Reflux esophagitis    hx  . Sleep apnea    PT STATES ONE TEST WAS POSITIVE AND ANOTHER TEST WAS NEGATIVE  . Two-vessel coronary artery disease    moderate. by cath in 2003. Status post stenting of the mdi RCA November 1999 normal left ventricular ejection fraction  . Wears glasses   . Wears hearing aid    B/L    Patient Active Problem List   Diagnosis Date Noted  . OAB (overactive bladder) 06/16/2020  . Recurrent UTI 06/16/2020  . Incomplete emptying of bladder 03/17/2020  . Benign prostatic hyperplasia with urinary obstruction 03/17/2020  . Difficulty urinating 02/16/2020  . NSTEMI (non-ST elevated myocardial infarction) ([place]) 07/22/2019  . Renal mass 07/14/2019  . Renal anasarca   . ESRD (end stage renal disease) ([place]) 04/10/2019  . 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[place]) 03/18/2018  . Chronic renal insufficiency, stage 3 (moderate) ([place]) 03/18/2018  . Diffuse wheezing 12/02/2017  . OSA and COPD overlap syndrome ([place]) 12/02/2017  . Sleeps in sitting position due to orthopnea 12/02/2017  . Pacemaker 12/02/2017  . Bradycardia with 31-40 beats per minute 12/02/2017  . Coronary  artery disease due to calcified coronary lesion 12/02/2017  . Insomnia due to medical condition 12/02/2017  . Snoring 12/02/2017  . Chronic diastolic CHF (congestive heart failure) ([place]) 05/10/2017  . Atypical chest pain   . Chest pain 05/09/2017  . Atrial flutter ([place]) 05/09/2017  . Acute renal failure superimposed on stage 4 chronic kidney disease ([place]) 05/09/2017  . Hypokalemia 04/05/2016  . Diabetes mellitus with nephropathy ([place]) 04/05/2016  . Hyponatremia 04/05/2016  . Hematuria 04/05/2016  . Diarrhea 04/05/2016  . Bilateral diabetic foot ulcer associated with secondary diabetes mellitus ([place]) 04/05/2016  . Streptococcal  bacteremia 04/05/2016  . Cellulitis of leg, left 04/05/2016  . Sepsis ([place]) 04/04/2016  . CAD (coronary artery disease) 11/10/2015  . Bilateral lower extremity edema 07/25/2015  . Foot infection 09/27/2014  . Diabetic foot infection ([place]) 09/27/2014  . CKD stage 3 due to type 2 diabetes mellitus ([place])   . Diabetes mellitus with renal manifestations, uncontrolled ([place])   . Benign essential HTN   . ED (erectile dysfunction) 06/09/2012  . Hypertension 03/11/2011  . OSTEOMYELITIS, ACUTE, ANKLE/FOOT 07/24/2010  . EDEMA 02/15/2010  . MORBID OBESITY 07/05/2009  . TOBACCO ABUSE 07/05/2009  . OBSTRUCTIVE SLEEP APNEA 07/05/2009  . COPD (chronic obstructive pulmonary disease) ([place]) 07/05/2009  . 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 HYPERLIPIDEMIA 03/30/2008  . Generalized anxiety disorder 03/30/2008  . [place] DISEASE 03/30/2008    Past Surgical History:  Procedure Laterality Date  . A/V FISTULAGRAM Left 04/30/2019   Procedure: A/V FISTULAGRAM;  Surgeon: Elam Dutch, MD;  Location: [facility];  Service: Cardiovascular;  Laterality: Left;  . A/V FISTULAGRAM Left 09/22/2019   Procedure: A/V FISTULAGRAM;  Surgeon: Marty Heck, MD;  Location: [facility];  Service: Cardiovascular;  Laterality: Left;  . ABDOMINAL SURGERY    . APPENDECTOMY    . AV FISTULA PLACEMENT Right 03/11/2018   Procedure: CREATION Brachiocephalic Fistula RIGHT ARM;  Surgeon: Rosetta Posner, MD;  Location: [facility] OR;  Service: Vascular;  Laterality: Right;  . AV FISTULA PLACEMENT Left 05/06/2019   Procedure: LEFT BRACHIOCEPHALIC ARTERIOVENOUS (AV) FISTULA CREATION;  Surgeon: Marty Heck, MD;  Location: [facility];  Service: Vascular;  Laterality: Left;  . BACK SURGERY     x3; cages in patient's back since 2000  . BIOPSY  11/12/2018   Procedure: BIOPSY;  Surgeon: Laurence Spates, MD;  Location: [facility];  Service: Endoscopy;;  . CARDIAC CATHETERIZATION     stent  . CHOLECYSTECTOMY    . CLOSED REDUCTION  SHOULDER DISLOCATION    . COLONOSCOPY W/ BIOPSIES AND POLYPECTOMY    . COLONOSCOPY WITH PROPOFOL N/A 11/12/2018   Procedure: COLONOSCOPY WITH PROPOFOL;  Surgeon: Laurence Spates, MD;  Location: [facility];  Service: Endoscopy;  Laterality: N/A;  . CORONARY ANGIOPLASTY    . diabetic ulcers    . DIAGNOSTIC LAPAROSCOPY    . DIALYSIS/PERMA CATHETER REMOVAL N/A 09/22/2019   Procedure: DIALYSIS/PERMA CATHETER REMOVAL;  Surgeon: Marty Heck, MD;  Location: [facility];  Service: Cardiovascular;  Laterality: N/A;  . ESOPHAGOGASTRODUODENOSCOPY (EGD) WITH PROPOFOL N/A 11/12/2018   Procedure: ESOPHAGOGASTRODUODENOSCOPY (EGD) WITH PROPOFOL;  Surgeon: Laurence Spates, MD;  Location: [facility];  Service: Endoscopy;  Laterality: N/A;  . GROIN EXPLORATION    . HEMOSTASIS CLIP PLACEMENT  11/12/2018   Procedure: HEMOSTASIS CLIP PLACEMENT;  Surgeon: Laurence Spates, MD;  Location: [facility];  Service: Endoscopy;;  . INSERT / REPLACE / REMOVE PACEMAKER    . INSERTION OF ARTERIOVENOUS (AV) ARTEGRAFT  ARM Left 03/18/2018   Procedure: INSERTION OF ARTERIOVENOUS (AV) FISTULA;  Surgeon: Rosetta Posner, MD;  Location: [facility];  Service: Vascular;  Laterality: Left;  . IR FLUORO GUIDE CV LINE RIGHT  04/11/2019  . IR US GUIDE VASC ACCESS RIGHT  04/11/2019  . LEFT HEART CATH AND CORONARY ANGIOGRAPHY N/A 07/23/2019   Procedure: LEFT HEART CATH AND CORONARY ANGIOGRAPHY;  Surgeon: Martinique, Peter M, MD;  Location: [facility];  Service: Cardiovascular;  Laterality: N/A;  . LIGATION ARTERIOVENOUS GORTEX GRAFT Right 03/18/2018   Procedure: LIGATION ARTERIOVENOUS FISTULA;  Surgeon: Rosetta Posner, MD;  Location: [facility];  Service: Vascular;  Laterality: Right;  . OSTECTOMY Left 04/23/2017   Procedure: OSTECTOMY LEFT GREAT TOE;  Surgeon: [name], DPM;  Location: [facility];  Service: Podiatry;  Laterality: Left;  left great toe  . POLYPECTOMY  11/12/2018   Procedure: POLYPECTOMY;  Surgeon: Laurence Spates, MD;   Location: [facility];  Service: Endoscopy;;  . TUMOR REMOVAL     small intestine x3  . VISCERAL ANGIOGRAPHY N/A 08/02/2019   Procedure: VISCERAL ANGIOGRAPHY;  Surgeon: Waynetta Sandy, MD;  Location: [facility];  Service: Cardiovascular;  Laterality: N/A;       Family History  Problem Relation Age of Onset  . Diabetes Mother   . Alcohol abuse Father     Social History   Tobacco Use  . Smoking status: Former Smoker    Packs/day: 1.00    Years: 40.00    Pack years: 40.00    Types: Cigarettes    Start date: 05/20/1968    Quit date: 11/30/2019    Years since quitting: 0.8  . Smokeless tobacco: Never Used  Vaping Use  . Vaping Use: Never used  Substance Use Topics  . Alcohol use: Not Currently    Alcohol/week: 0.0 standard drinks    Comment: rare  . Drug use: No    Home Medications Prior to Admission medications   Medication Sig Start Date End Date Taking? Authorizing Provider  albuterol (VENTOLIN HFA) 108 (90 Base) MCG/ACT inhaler Inhale 2 puffs into the lungs every 6 (six) hours as needed for wheezing or shortness of breath.    Yes [provider]  aspirin EC 81 MG EC tablet Take 1 tablet (81 mg total) by mouth daily. 07/25/19  Yes Isaac Bliss, Rayford Halsted, MD  atorvastatin (LIPITOR) 40 MG tablet Take 1 tablet by mouth once daily 08/24/20  Yes Branch, Alphonse Guild, MD  beclomethasone (QVAR) 80 MCG/ACT inhaler Inhale 2 puffs into the lungs 2 (two) times daily as needed (shortness of breath).   Yes [provider]  bumetanide (BUMEX) 2 MG tablet Take 2 mg by mouth 2 (two) times daily. 07/15/19  Yes [provider]  buPROPion (WELLBUTRIN SR) 150 MG 12 hr tablet Take 150 mg by mouth 2 (two) times daily.   Yes [provider]  carvedilol (COREG) 25 MG tablet Take 1 tablet (25 mg total) by mouth See admin instructions. Take 25 mg twice daily on Sun, Mon, Wed, and Fri. Take 25 mg in the evening on Tues, Thurs, and Sat (dialysis days)  11/23/19  Yes Branch, Alphonse Guild, MD  cefUROXime (CEFTIN) 500 MG tablet Take 1 tablet (500 mg total) by mouth 2 (two) times daily with a meal for 7 days. 09/20/20 09/27/20 Yes McKenzie, Candee Furbish, MD  Cholecalciferol (VITAMIN D3) 2000 units TABS Take 2,000 Units by mouth daily.    Yes [provider]  denosumab (PROLIA) 60 MG/ML SOSY injection Inject 60 mg into the skin every 6 (six) months.   Yes [provider]  Docusate Sodium 50 MG/15ML LIQD 2 capsule as needed   Yes [provider]  lactulose (CHRONULAC) 10 GM/15ML solution Take 20 g by mouth 2 (two) times daily as needed for mild constipation.   Yes [provider]  linaclotide (LINZESS) 290 MCG CAPS capsule Take 290 mcg by mouth daily as needed (constipation).   Yes [provider]  magnesium oxide (MAG-OX) 400 MG tablet Take 400 mg by mouth daily.   Yes [provider]  Melatonin 10 MG TABS Take 10 mg by mouth at bedtime as needed (sleep).    Yes [provider]  multivitamin (RENA-VIT) TABS tablet Take 1 tablet by mouth daily.   Yes [provider]  omeprazole (PRILOSEC) 20 MG capsule Take 20 mg by mouth daily.    Yes [provider]  polyethylene glycol (MIRALAX / GLYCOLAX) packet Take 17 g by mouth daily as needed for mild constipation. Mix in morning coffee   Yes [provider]  risperiDONE (RISPERDAL) 0.5 MG tablet Take 0.5 mg by mouth at bedtime.   Yes [provider]  ROPINIROLE HCL PO Take 0.25 mg by mouth at bedtime. 05/29/20  Yes [provider]  Sennosides 25 MG TABS Take 50 mg by mouth daily as needed (constipation).    Yes [provider]  sildenafil (VIAGRA) 100 MG tablet Take 100 mg by mouth daily. 05/29/20  Yes [provider]  solifenacin (VESICARE) 5 MG tablet Take 1 tablet (5 mg total) by mouth daily. 07/12/20  Yes McKenzie, Candee Furbish, MD  tadalafil (CIALIS) 20 MG tablet Take 1 tablet (20 mg total) by  mouth daily as needed. 06/16/20  Yes McKenzie, Candee Furbish, MD  tamsulosin (FLOMAX) 0.4 MG CAPS capsule Take 1 capsule (0.4 mg total) by mouth daily after supper. 03/17/20  Yes McKenzie, Candee Furbish, MD  tiZANidine (ZANAFLEX) 2 MG tablet Take 2 mg by mouth at bedtime. 03/06/20  Yes [provider]  traMADol (ULTRAM) 50 MG tablet Take by mouth every 6 (six) hours as needed.   Yes [provider]  nitrofurantoin (MACRODANTIN) 50 MG capsule Take 1 capsule (50 mg total) by mouth at bedtime. Patient not taking: No sig reported 06/16/20   Cleon Gustin, MD  nitrofurantoin, macrocrystal-monohydrate, (MACROBID) 100 MG capsule Take 1 capsule (100 mg total) by mouth every 12 (twelve) hours. Patient not taking: No sig reported 08/25/20   Cleon Gustin, MD  nitrofurantoin, macrocrystal-monohydrate, (MACROBID) 100 MG capsule Take 1 capsule (100 mg total) by mouth at bedtime. Patient not taking: No sig reported 09/15/20   Cleon Gustin, MD  nitroGLYCERIN (NITROSTAT) 0.4 MG SL tablet Place 1 tablet (0.4 mg total) under the tongue every 5 (five) minutes as needed for chest pain. Patient not taking: Reported on 09/21/2020 08/04/19 02/27/21  Arnoldo Lenis, MD    Allergies    Patient has no known allergies.  Review of Systems   Review of Systems  Constitutional: Negative for appetite change and fatigue.  HENT: Negative for congestion, ear discharge and sinus pressure.   Eyes: Negative for discharge.  Respiratory: Negative for cough.   Cardiovascular: Negative for chest pain.  Gastrointestinal: Negative for abdominal pain and diarrhea.  Genitourinary: Positive for difficulty urinating and dysuria. Negative for frequency and hematuria.  Musculoskeletal: Negative for back pain.  Skin: Negative for rash.  Neurological: Negative for  seizures and headaches.  Psychiatric/Behavioral: Negative for hallucinations.    Physical Exam Updated Vital Signs BP 130/69   Pulse 60   Temp  98.5 F (36.9 C) (Oral)   Resp 18   Ht 6\' 4"  (1.93 m)   Wt 94.3 kg   SpO2 94%   BMI 25.32 kg/m   Physical Exam Vitals and nursing note reviewed.  Constitutional:      Appearance: He is well-developed.  HENT:     Head: Normocephalic.     Nose: Nose normal.  Eyes:     General: No scleral icterus.    Conjunctiva/sclera: Conjunctivae normal.  Neck:     Thyroid: No thyromegaly.  Cardiovascular:     Rate and Rhythm: Normal rate and regular rhythm.     Heart sounds: No murmur heard. No friction rub. No gallop.   Pulmonary:     Breath sounds: No stridor. No wheezing or rales.  Chest:     Chest wall: No tenderness.  Abdominal:     General: There is no distension.     Tenderness: There is no abdominal tenderness. There is no rebound.  Musculoskeletal:        General: Normal range of motion.     Cervical back: Neck supple.  Lymphadenopathy:     Cervical: No cervical adenopathy.  Skin:    Findings: No erythema or rash.  Neurological:     Mental Status: He is alert and oriented to person, place, and time.     Motor: No abnormal muscle tone.     Coordination: Coordination normal.  Psychiatric:        Behavior: Behavior normal.     ED Results / Procedures / Treatments   Labs (all labs ordered are listed, but only abnormal results are displayed) Labs Reviewed  CBC WITH DIFFERENTIAL/PLATELET - Abnormal; Notable for the following components:      Result Value   RBC 3.68 (*)    MCV 109.2 (*)    MCH 35.6 (*)    Platelets 129 (*)    Monocytes Absolute 1.1 (*)    Abs Immature Granulocytes 0.21 (*)    All other components within normal limits  BASIC METABOLIC PANEL - Abnormal; Notable for the following components:   Sodium 130 (*)    Potassium 3.0 (*)    Chloride 92 (*)    Glucose, Bld 117 (*)    BUN 66 (*)    Creatinine, Ser 7.23 (*)    GFR, Estimated 8 (*)    All other components within normal limits  URINALYSIS, ROUTINE W REFLEX MICROSCOPIC     EKG None  Radiology No results found.  Procedures Procedures   Medications Ordered in ED Medications - No data to display  ED Course  I have reviewed the triage vital signs and the nursing notes. Bladder scan showed 200 cc Pertinent labs & imaging results that were available during my care of the patient were reviewed by me and considered in my medical decision making (see chart for details). I spoke with Dr. Alyson Ingles his urologist and he recommended the patient increase his Flomax so he is taking it twice a day and he will see him Monday   MDM Rules/Calculators/A&P                          Patient with difficulty urination.  He will increase his Flomax to twice a day and continue taking the Ceftin that was started yesterday and  to see his urologist Monday Final Clinical Impression(s) / ED Diagnoses Final diagnoses:  None    Rx / DC Orders ED Discharge Orders    None       Milton Ferguson, MD 09/21/20 [phone]

## 2020-09-21 NOTE — Telephone Encounter (Signed)
Having trouble emptying bladder.  Hurts to urinate.   PVR may be needed as well as a catheter.  Please advise.  Call back:  [phone]  Thanks, Helene Kelp

## 2020-09-25 ENCOUNTER — Other Ambulatory Visit: Payer: Self-pay

## 2020-09-25 ENCOUNTER — Ambulatory Visit (INDEPENDENT_AMBULATORY_CARE_PROVIDER_SITE_OTHER): Payer: Medicare Other | Admitting: Urology

## 2020-09-25 VITALS — BP 94/55 | HR 60 | Temp 97.8°F

## 2020-09-25 DIAGNOSIS — I6523 Occlusion and stenosis of bilateral carotid arteries: Secondary | ICD-10-CM

## 2020-09-25 DIAGNOSIS — N390 Urinary tract infection, site not specified: Secondary | ICD-10-CM

## 2020-09-25 LAB — URINALYSIS, ROUTINE W REFLEX MICROSCOPIC
Bilirubin, UA: NEGATIVE
Glucose, UA: NEGATIVE
Ketones, UA: NEGATIVE
Nitrite, UA: NEGATIVE
Specific Gravity, UA: 1.015 (ref 1.005–1.030)
Urobilinogen, Ur: 0.2 mg/dL (ref 0.2–1.0)
pH, UA: 5.5 (ref 5.0–7.5)

## 2020-09-25 LAB — MICROSCOPIC EXAMINATION: Renal Epithel, UA: NONE SEEN /hpf

## 2020-09-25 MED ORDER — NITROFURANTOIN MONOHYD MACRO 100 MG PO CAPS: 100.0000 mg | ORAL_CAPSULE | Freq: Every day | ORAL | 0 refills | Status: DC

## 2020-09-25 NOTE — Progress Notes (Signed)
09/25/2020 9:54 AM   [name] 1951/05/06 [id number]  Referring provider: Tobe Sos, MD [address] [place] [address]  Followup BPH and recurrent UTI  HPI: [name] is a 70yo here for followup for BPH and recurrent UTI. He was seen in th ER last week and had his flomax increased to BID. Since then he has fallen multiple times and is very dizzy. He continues to have difficulty with urinary urgency, frequency q1-2 hours, nocturia 3-4x and a weak urinary stream. He is unhappy with his urination. NO UTIs since last visit. He wishes to pursue surgical interventions for BPH   PMH: Past Medical History:  Diagnosis Date  . AAA (abdominal aortic aneurysm) ([place]) 06/2016   Mild increase in size of 3.4 cm infrarenal abdominal aortic aneurysm  . Anemia   . Aortic atherosclerosis ([place])   . Asthma   . Atrial flutter ([place])   . AVF (arteriovenous fistula) ([facility])    Left forearm  . CAD (coronary artery disease)    a. s/p prior stenting of RCA in 1999 b. cath in 2003 showing moderate CAD c. NST in 2016 showing small area of ischemic and low-risk  . CHF (congestive heart failure) ([place])   . CKD (chronic kidney disease), stage V ([place])    kidney transplant evaluation  . COPD (chronic obstructive pulmonary disease) ([place])    with ongoing tobacco use and patient failed sham takes  . Diverticulosis 2018   Mild sigmoid colon diverticulosis.  Marland Kitchen DM2 (diabetes mellitus, type 2) ([place])   . Dyslipidemia   . Dysrhythmia 2018   atrial fibrillation  . Edema    chronic lower extremity secondary to right heart failure and chronic venous insufficiency  . Fatigue   . Fatty liver 2010   Mild  . Headache   . HTN (hypertension)   . Morbid obesity ([place])   . Multiple pulmonary nodules 05/2018   Bilateral  . Pneumonia   . Presence of permanent cardiac pacemaker   . 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   Marland Kitchen Reflux esophagitis    hx   . Sleep apnea    PT STATES ONE TEST WAS POSITIVE AND ANOTHER TEST WAS NEGATIVE  . Two-vessel coronary artery disease    moderate. by cath in 2003. Status post stenting of the mdi RCA November 1999 normal left ventricular ejection fraction  . Wears glasses   . Wears hearing aid    B/L    Surgical History: Past Surgical History:  Procedure Laterality Date  . A/V FISTULAGRAM Left 04/30/2019   Procedure: A/V FISTULAGRAM;  Surgeon: Elam Dutch, MD;  Location: [facility];  Service: Cardiovascular;  Laterality: Left;  . A/V FISTULAGRAM Left 09/22/2019   Procedure: A/V FISTULAGRAM;  Surgeon: Marty Heck, MD;  Location: [facility];  Service: Cardiovascular;  Laterality: Left;  . ABDOMINAL SURGERY    . APPENDECTOMY    . AV FISTULA PLACEMENT Right 03/11/2018   Procedure: CREATION Brachiocephalic Fistula RIGHT ARM;  Surgeon: Rosetta Posner, MD;  Location: [facility];  Service: Vascular;  Laterality: Right;  . AV FISTULA PLACEMENT Left 05/06/2019   Procedure: LEFT BRACHIOCEPHALIC ARTERIOVENOUS (AV) FISTULA CREATION;  Surgeon: Marty Heck, MD;  Location: [facility];  Service: Vascular;  Laterality: Left;  . BACK SURGERY     x3; cages in patient's back since 2000  . BIOPSY  11/12/2018   Procedure: BIOPSY;  Surgeon: Laurence Spates, MD;  Location: [facility];  Service: Endoscopy;;  . CARDIAC CATHETERIZATION     stent  . CHOLECYSTECTOMY    . CLOSED REDUCTION SHOULDER DISLOCATION    . COLONOSCOPY W/ BIOPSIES AND POLYPECTOMY    . COLONOSCOPY WITH PROPOFOL N/A 11/12/2018   Procedure: COLONOSCOPY WITH PROPOFOL;  Surgeon: Laurence Spates, MD;  Location: [facility];  Service: Endoscopy;  Laterality: N/A;  . CORONARY ANGIOPLASTY    . diabetic ulcers    . DIAGNOSTIC LAPAROSCOPY    . DIALYSIS/PERMA CATHETER REMOVAL N/A 09/22/2019   Procedure: DIALYSIS/PERMA CATHETER REMOVAL;  Surgeon: Marty Heck, MD;  Location: [facility];  Service: Cardiovascular;  Laterality:  N/A;  . ESOPHAGOGASTRODUODENOSCOPY (EGD) WITH PROPOFOL N/A 11/12/2018   Procedure: ESOPHAGOGASTRODUODENOSCOPY (EGD) WITH PROPOFOL;  Surgeon: Laurence Spates, MD;  [facility]: [facility];  Service: Endoscopy;  Laterality: N/A;  . GROIN EXPLORATION    . HEMOSTASIS CLIP PLACEMENT  11/12/2018   Procedure: HEMOSTASIS CLIP PLACEMENT;  Surgeon: Laurence Spates, MD;  [facility]: [facility];  Service: Endoscopy;;  . INSERT / REPLACE / REMOVE PACEMAKER    . INSERTION OF ARTERIOVENOUS (AV) ARTEGRAFT ARM Left 03/18/2018   Procedure: INSERTION OF ARTERIOVENOUS (AV) FISTULA;  Surgeon: Rosetta Posner, MD;  Location: [facility];  Service: Vascular;  Laterality: Left;  . IR FLUORO GUIDE CV LINE RIGHT  04/11/2019  . IR US GUIDE VASC ACCESS RIGHT  04/11/2019  . LEFT HEART CATH AND CORONARY ANGIOGRAPHY N/A 07/23/2019   Procedure: LEFT HEART CATH AND CORONARY ANGIOGRAPHY;  Surgeon: Martinique, Peter M, MD;  Location: [facility];  Service: Cardiovascular;  Laterality: N/A;  . LIGATION ARTERIOVENOUS GORTEX GRAFT Right 03/18/2018   Procedure: LIGATION ARTERIOVENOUS FISTULA;  Surgeon: Rosetta Posner, MD;  Location: [facility];  Service: Vascular;  Laterality: Right;  . OSTECTOMY Left 04/23/2017   Procedure: OSTECTOMY LEFT GREAT TOE;  Surgeon: [name], DPM;  Location: [facility];  Service: Podiatry;  Laterality: Left;  left great toe  . POLYPECTOMY  11/12/2018   Procedure: POLYPECTOMY;  Surgeon: Laurence Spates, MD;  Location: [facility];  Service: Endoscopy;;  . TUMOR REMOVAL     small intestine x3  . VISCERAL ANGIOGRAPHY N/A 08/02/2019   Procedure: VISCERAL ANGIOGRAPHY;  Surgeon: Waynetta Sandy, MD;  Location: [facility];  Service: Cardiovascular;  Laterality: N/A;    Home Medications:  Allergies as of 09/25/2020   No Known Allergies     Medication List       Accurate as of Sep 25, 2020  9:54 AM. If you have any questions, ask your nurse or doctor.        albuterol 108 (90 Base) MCG/ACT  inhaler Commonly known as: VENTOLIN HFA Inhale 2 puffs into the lungs every 6 (six) hours as needed for wheezing or shortness of breath.   aspirin 81 MG EC tablet Take 1 tablet (81 mg total) by mouth daily.   atorvastatin 40 MG tablet Commonly known as: LIPITOR Take 1 tablet by mouth once daily   beclomethasone 80 MCG/ACT inhaler Commonly known as: QVAR Inhale 2 puffs into the lungs 2 (two) times daily as needed (shortness of breath).   bumetanide 2 MG tablet Commonly known as: BUMEX Take 2 mg by mouth 2 (two) times daily.   buPROPion 150 MG 12 hr tablet Commonly known as: WELLBUTRIN SR Take 150 mg by mouth 2 (two) times daily.   carvedilol 25 MG tablet Commonly known as: COREG Take 1 tablet (25  mg total) by mouth See admin instructions. Take 25 mg twice daily on Sun, Mon, Wed, and Fri. Take 25 mg in the evening on Tues, Thurs, and Sat (dialysis days)   cefUROXime 500 MG tablet Commonly known as: CEFTIN Take 1 tablet (500 mg total) by mouth 2 (two) times daily with a meal for 7 days.   denosumab 60 MG/ML Sosy injection Commonly known as: PROLIA Inject 60 mg into the skin every 6 (six) months.   Docusate Sodium 50 MG/15ML Liqd 2 capsule as needed   lactulose 10 GM/15ML solution Commonly known as: CHRONULAC Take 20 g by mouth 2 (two) times daily as needed for mild constipation.   linaclotide 290 MCG Caps capsule Commonly known as: LINZESS Take 290 mcg by mouth daily as needed (constipation).   magnesium oxide 400 MG tablet Commonly known as: MAG-OX Take 400 mg by mouth daily.   Melatonin 10 MG Tabs Take 10 mg by mouth at bedtime as needed (sleep).   multivitamin Tabs tablet Take 1 tablet by mouth daily.   nitrofurantoin (macrocrystal-monohydrate) 100 MG capsule Commonly known as: MACROBID Take 1 capsule (100 mg total) by mouth every 12 (twelve) hours.   nitrofurantoin (macrocrystal-monohydrate) 100 MG capsule Commonly known as: MACROBID Take 1 capsule (100  mg total) by mouth at bedtime.   nitrofurantoin 50 MG capsule Commonly known as: MACRODANTIN Take 1 capsule (50 mg total) by mouth at bedtime.   nitroGLYCERIN 0.4 MG SL tablet Commonly known as: NITROSTAT Place 1 tablet (0.4 mg total) under the tongue every 5 (five) minutes as needed for chest pain.   omeprazole 20 MG capsule Commonly known as: PRILOSEC Take 20 mg by mouth daily.   polyethylene glycol 17 g packet Commonly known as: MIRALAX / GLYCOLAX Take 17 g by mouth daily as needed for mild constipation. Mix in morning coffee   risperiDONE 0.5 MG tablet Commonly known as: RISPERDAL Take 0.5 mg by mouth at bedtime.   ROPINIROLE HCL PO Take 0.25 mg by mouth at bedtime.   Sennosides 25 MG Tabs Take 50 mg by mouth daily as needed (constipation).   sertraline 50 MG tablet Commonly known as: ZOLOFT Take 1 tablet by mouth daily.   sildenafil 100 MG tablet Commonly known as: VIAGRA Take 100 mg by mouth daily.   solifenacin 5 MG tablet Commonly known as: VESICARE Take 1 tablet (5 mg total) by mouth daily.   tadalafil 20 MG tablet Commonly known as: CIALIS Take 1 tablet (20 mg total) by mouth daily as needed.   tamsulosin 0.4 MG Caps capsule Commonly known as: FLOMAX Take 1 capsule (0.4 mg total) by mouth daily after supper.   tiZANidine 2 MG tablet Commonly known as: ZANAFLEX Take 2 mg by mouth at bedtime.   traMADol 50 MG tablet Commonly known as: ULTRAM Take by mouth every 6 (six) hours as needed.   Vitamin D3 50 MCG (2000 UT) Tabs Take 2,000 Units by mouth daily.       Allergies: No Known Allergies  Family History: Family History  Problem Relation Age of Onset  . Diabetes Mother   . Alcohol abuse Father     Social History:  reports that he quit smoking about 9 months ago. His smoking use included cigarettes. He started smoking about 52 years ago. He has a 40.00 pack-year smoking history. He has never used smokeless tobacco. He reports previous  alcohol use. He reports that he does not use drugs.  ROS: All other review of systems were  reviewed and are negative except what is noted above in HPI  Physical Exam: BP (!) 94/55   Pulse 60   Temp 97.8 F (36.6 C)   Constitutional:  Alert and oriented, No acute distress. HEENT: [place] AT, moist mucus membranes.  Trachea midline, no masses. Cardiovascular: No clubbing, cyanosis, or edema. Respiratory: Normal respiratory effort, no increased work of breathing. GI: Abdomen is soft, nontender, nondistended, no abdominal masses GU: No CVA tenderness.  Lymph: No cervical or inguinal lymphadenopathy. Skin: No rashes, bruises or suspicious lesions. Neurologic: Grossly intact, no focal deficits, moving all 4 extremities. Psychiatric: Normal mood and affect.  Laboratory Data: Lab Results  Component Value Date   WBC 10.0 09/21/2020   HGB 13.1 09/21/2020   HCT 40.2 09/21/2020   MCV 109.2 (H) 09/21/2020   PLT 129 (L) 09/21/2020    Lab Results  Component Value Date   CREATININE 7.23 (H) 09/21/2020    No results found for: PSA  Lab Results  Component Value Date   TESTOSTERONE 297 07/28/2015    Lab Results  Component Value Date   HGBA1C 5.8 (H) 04/14/2017    Urinalysis    Component Value Date/Time   COLORURINE YELLOW 07/22/2019 1625   APPEARANCEUR Hazy (A) 09/11/2020 1411   LABSPEC 1.010 07/22/2019 1625   PHURINE 6.0 07/22/2019 1625   GLUCOSEU Negative 09/11/2020 1411   HGBUR NEGATIVE 07/22/2019 1625   BILIRUBINUR Negative 09/11/2020 1411   KETONESUR NEGATIVE 07/22/2019 1625   PROTEINUR 2+ (A) 09/11/2020 1411   PROTEINUR >=300 (A) 07/22/2019 1625   UROBILINOGEN 0.2 09/27/2014 0027   NITRITE Negative 09/11/2020 1411   NITRITE NEGATIVE 07/22/2019 1625   LEUKOCYTESUR 3+ (A) 09/11/2020 1411   LEUKOCYTESUR NEGATIVE 07/22/2019 1625    Lab Results  Component Value Date   LABMICR See below: 09/11/2020   WBCUA >30 (A) 09/11/2020   LABEPIT 0-10 09/11/2020   MUCUS Present  09/11/2020   BACTERIA Many (A) 09/11/2020    Pertinent Imaging:  No results found for this or any previous visit.  No results found for this or any previous visit.  No results found for this or any previous visit.  No results found for this or any previous visit.  Results for orders placed in visit on 09/15/20  US RENAL  Narrative CLINICAL DATA:  Recurrent urinary tract infection.  EXAM: RENAL / URINARY TRACT ULTRASOUND COMPLETE  COMPARISON:  Renal ultrasound 04/05/2016.  Abdominal CT 07/28/2019.  FINDINGS: Right Kidney:  Renal measurements: 10.3 x 5.1 x 4.0 cm = volume: 109.8 mL. Mild renal cortical thinning and increased echogenicity. There is a cystic lesion in the upper pole which measures up to 1.8 cm. Prominent renal sinus fat. No hydronephrosis or suspicious cortical lesion.  Left Kidney:  Renal measurements: 10.5 x 6.2 x 4.2 cm = volume: 140.9 mL. Mild renal cortical thinning and increased echogenicity. Prominent renal sinus fat. No hydronephrosis or focal cortical lesion.  Bladder:  Not visualized.  Other:  Study is mildly limited by body habitus.  No evidence of ascites.  IMPRESSION: 1. Both kidneys demonstrate mild cortical thinning and increased cortical echogenicity consistent with "chronic medical renal disease". 2. Small cyst in the upper pole of the right kidney. No hydronephrosis. 3. The bladder was not visualized.   Electronically Signed By: Richardean Sale M.D. On: 09/18/2020 12:11  No results found for this or any previous visit.  No results found for this or any previous visit.  No results found for this or any previous visit.  Assessment & Plan:    1. Urinary tract infection without hematuria, site unspecified -continue macrobid 100mg  qhs - BLADDER SCAN AMB NON-IMAGING - Urinalysis, Routine w reflex microscopic   No follow-ups on file.  Nicolette Bang, MD  [facility]    Cystoscopy  Procedure Note  Patient identification was confirmed, informed consent was obtained, and patient was prepped using Betadine solution.  Lidocaine jelly was administered per urethral meatus.     Pre-Procedure: - Inspection reveals a normal caliber ureteral meatus.  Procedure: The flexible cystoscope was introduced without difficulty - No urethral strictures/lesions are present. - Enlarged prostate bilobar hyperplasia - Normal bladder neck - Bilateral ureteral orifices identified - Bladder mucosa  reveals no ulcers, tumors, or lesions - No bladder stones - No trabeculation  Retroflexion shows no intravesical prostatic protrusion   Post-Procedure: - Patient tolerated the procedure well  Assessment/ Plan: We discussed the management of his BPH including continued medical therapy, Rezum, Urolift, TURP and simple prostatectomy. After discussing the options the patient has elected to proceed with Urolift. Risks/benefits/alternatives discussed.  Nicolette Bang, MD

## 2020-09-25 NOTE — Progress Notes (Signed)
post void residual=0  Urological Symptom Review  Patient is experiencing the following symptoms: Frequent urination Burning/pain with urination Get up at night to urinate Stream starts and stops Trouble starting stream Have to strain to urinate Blood in urine Urinary tract infection Erection problems (male only)   Review of Systems  Gastrointestinal (upper)  : Negative for upper GI symptoms  Gastrointestinal (lower) : Diarrhea Constipation  Constitutional : Weight loss Fatigue  Skin: Negative for skin symptoms  Eyes: Negative for eye symptoms  Ear/Nose/Throat : Negative for Ear/Nose/Throat symptoms  Hematologic/Lymphatic: Easy bruising  Cardiovascular : Chest pain  Respiratory : Cough Shortness of breath  Endocrine: Excessive thirst  Musculoskeletal: Back pain Joint pain  Neurological: Headaches Dizziness  Psychologic: Depression Anxiety

## 2020-09-27 NOTE — Telephone Encounter (Signed)
Error

## 2020-09-28 ENCOUNTER — Other Ambulatory Visit: Payer: Self-pay | Admitting: *Deleted

## 2020-09-28 DIAGNOSIS — Z01818 Encounter for other preprocedural examination: Secondary | ICD-10-CM

## 2020-09-28 DIAGNOSIS — I2510 Atherosclerotic heart disease of native coronary artery without angina pectoris: Secondary | ICD-10-CM

## 2020-09-28 DIAGNOSIS — I5032 Chronic diastolic (congestive) heart failure: Secondary | ICD-10-CM

## 2020-09-28 NOTE — Progress Notes (Signed)
[id number]

## 2020-10-03 ENCOUNTER — Encounter: Payer: Self-pay | Admitting: Urology

## 2020-10-03 NOTE — Patient Instructions (Signed)

## 2020-10-05 ENCOUNTER — Other Ambulatory Visit: Payer: Self-pay | Admitting: *Deleted

## 2020-10-05 ENCOUNTER — Encounter: Payer: Self-pay | Admitting: *Deleted

## 2020-10-05 ENCOUNTER — Encounter: Payer: Self-pay | Admitting: Cardiology

## 2020-10-05 DIAGNOSIS — I5032 Chronic diastolic (congestive) heart failure: Secondary | ICD-10-CM

## 2020-10-06 ENCOUNTER — Telehealth: Payer: Self-pay

## 2020-10-06 MED ORDER — ONDANSETRON HCL 4 MG PO TABS: 4.0000 mg | ORAL_TABLET | Freq: Three times a day (TID) | ORAL | 0 refills | Status: DC | PRN

## 2020-10-06 NOTE — Telephone Encounter (Signed)
Wife stated husband is having n/v and she is concerned about his elevated liver enzymes. Per Dr. Alyson Ingles zofran sent in for n/v and wife informed to increase husband fluid intake to prevent dehydration. Wife voiced understanding.

## 2020-10-08 ENCOUNTER — Other Ambulatory Visit: Payer: Self-pay

## 2020-10-08 ENCOUNTER — Inpatient Hospital Stay (HOSPITAL_COMMUNITY)
Admission: EM | Admit: 2020-10-08 | Discharge: 2020-10-13 | DRG: 391 | Disposition: A | Discharge status: Home / Self Care | Payer: Medicare Other | Attending: Family Medicine | Admitting: Family Medicine

## 2020-10-08 ENCOUNTER — Encounter (HOSPITAL_COMMUNITY): Payer: Self-pay | Admitting: Emergency Medicine

## 2020-10-08 ENCOUNTER — Emergency Department (HOSPITAL_COMMUNITY): Payer: Medicare Other

## 2020-10-08 DIAGNOSIS — N401 Enlarged prostate with lower urinary tract symptoms: Secondary | ICD-10-CM | POA: Diagnosis present

## 2020-10-08 DIAGNOSIS — K7460 Unspecified cirrhosis of liver: Secondary | ICD-10-CM | POA: Diagnosis present

## 2020-10-08 DIAGNOSIS — Z833 Family history of diabetes mellitus: Secondary | ICD-10-CM

## 2020-10-08 DIAGNOSIS — Z79890 Hormone replacement therapy (postmenopausal): Secondary | ICD-10-CM

## 2020-10-08 DIAGNOSIS — I132 Hypertensive heart and chronic kidney disease with heart failure and with stage 5 chronic kidney disease, or end stage renal disease: Secondary | ICD-10-CM | POA: Diagnosis present

## 2020-10-08 DIAGNOSIS — N186 End stage renal disease: Secondary | ICD-10-CM | POA: Diagnosis present

## 2020-10-08 DIAGNOSIS — I5032 Chronic diastolic (congestive) heart failure: Secondary | ICD-10-CM | POA: Diagnosis present

## 2020-10-08 DIAGNOSIS — I10 Essential (primary) hypertension: Secondary | ICD-10-CM | POA: Diagnosis present

## 2020-10-08 DIAGNOSIS — E1129 Type 2 diabetes mellitus with other diabetic kidney complication: Secondary | ICD-10-CM

## 2020-10-08 DIAGNOSIS — Z6825 Body mass index (BMI) 25.0-25.9, adult: Secondary | ICD-10-CM

## 2020-10-08 DIAGNOSIS — E1121 Type 2 diabetes mellitus with diabetic nephropathy: Secondary | ICD-10-CM | POA: Diagnosis present

## 2020-10-08 DIAGNOSIS — I4891 Unspecified atrial fibrillation: Secondary | ICD-10-CM | POA: Diagnosis present

## 2020-10-08 DIAGNOSIS — M549 Dorsalgia, unspecified: Secondary | ICD-10-CM | POA: Diagnosis present

## 2020-10-08 DIAGNOSIS — K5730 Diverticulosis of large intestine without perforation or abscess without bleeding: Secondary | ICD-10-CM | POA: Diagnosis present

## 2020-10-08 DIAGNOSIS — I2510 Atherosclerotic heart disease of native coronary artery without angina pectoris: Secondary | ICD-10-CM | POA: Diagnosis not present

## 2020-10-08 DIAGNOSIS — Z974 Presence of external hearing-aid: Secondary | ICD-10-CM

## 2020-10-08 DIAGNOSIS — Z87891 Personal history of nicotine dependence: Secondary | ICD-10-CM

## 2020-10-08 DIAGNOSIS — Z955 Presence of coronary angioplasty implant and graft: Secondary | ICD-10-CM

## 2020-10-08 DIAGNOSIS — F32A Depression, unspecified: Secondary | ICD-10-CM | POA: Diagnosis present

## 2020-10-08 DIAGNOSIS — K5900 Constipation, unspecified: Secondary | ICD-10-CM | POA: Diagnosis present

## 2020-10-08 DIAGNOSIS — I719 Aortic aneurysm of unspecified site, without rupture: Secondary | ICD-10-CM | POA: Diagnosis present

## 2020-10-08 DIAGNOSIS — E1165 Type 2 diabetes mellitus with hyperglycemia: Secondary | ICD-10-CM

## 2020-10-08 DIAGNOSIS — Z87440 Personal history of urinary (tract) infections: Secondary | ICD-10-CM

## 2020-10-08 DIAGNOSIS — K2100 Gastro-esophageal reflux disease with esophagitis, without bleeding: Secondary | ICD-10-CM | POA: Diagnosis present

## 2020-10-08 DIAGNOSIS — Z7982 Long term (current) use of aspirin: Secondary | ICD-10-CM

## 2020-10-08 DIAGNOSIS — Z20822 Contact with and (suspected) exposure to covid-19: Secondary | ICD-10-CM | POA: Diagnosis present

## 2020-10-08 DIAGNOSIS — K760 Fatty (change of) liver, not elsewhere classified: Secondary | ICD-10-CM | POA: Diagnosis present

## 2020-10-08 DIAGNOSIS — Z981 Arthrodesis status: Secondary | ICD-10-CM

## 2020-10-08 DIAGNOSIS — E1122 Type 2 diabetes mellitus with diabetic chronic kidney disease: Secondary | ICD-10-CM | POA: Diagnosis present

## 2020-10-08 DIAGNOSIS — E785 Hyperlipidemia, unspecified: Secondary | ICD-10-CM | POA: Diagnosis present

## 2020-10-08 DIAGNOSIS — K529 Noninfective gastroenteritis and colitis, unspecified: Principal | ICD-10-CM | POA: Diagnosis present

## 2020-10-08 DIAGNOSIS — G8929 Other chronic pain: Secondary | ICD-10-CM | POA: Diagnosis present

## 2020-10-08 DIAGNOSIS — IMO0002 Reserved for concepts with insufficient information to code with codable children: Secondary | ICD-10-CM | POA: Diagnosis present

## 2020-10-08 DIAGNOSIS — Z950 Presence of cardiac pacemaker: Secondary | ICD-10-CM

## 2020-10-08 DIAGNOSIS — Z79899 Other long term (current) drug therapy: Secondary | ICD-10-CM

## 2020-10-08 DIAGNOSIS — I252 Old myocardial infarction: Secondary | ICD-10-CM

## 2020-10-08 DIAGNOSIS — G4733 Obstructive sleep apnea (adult) (pediatric): Secondary | ICD-10-CM | POA: Diagnosis present

## 2020-10-08 DIAGNOSIS — F411 Generalized anxiety disorder: Secondary | ICD-10-CM | POA: Diagnosis present

## 2020-10-08 DIAGNOSIS — I714 Abdominal aortic aneurysm, without rupture: Secondary | ICD-10-CM | POA: Diagnosis present

## 2020-10-08 DIAGNOSIS — J449 Chronic obstructive pulmonary disease, unspecified: Secondary | ICD-10-CM | POA: Diagnosis present

## 2020-10-08 DIAGNOSIS — E1151 Type 2 diabetes mellitus with diabetic peripheral angiopathy without gangrene: Secondary | ICD-10-CM | POA: Diagnosis present

## 2020-10-08 DIAGNOSIS — N138 Other obstructive and reflux uropathy: Secondary | ICD-10-CM | POA: Diagnosis present

## 2020-10-08 DIAGNOSIS — Z992 Dependence on renal dialysis: Secondary | ICD-10-CM

## 2020-10-08 DIAGNOSIS — M810 Age-related osteoporosis without current pathological fracture: Secondary | ICD-10-CM | POA: Diagnosis present

## 2020-10-08 DIAGNOSIS — E871 Hypo-osmolality and hyponatremia: Secondary | ICD-10-CM | POA: Diagnosis present

## 2020-10-08 DIAGNOSIS — I700 Atherosclerosis of aorta: Secondary | ICD-10-CM | POA: Diagnosis present

## 2020-10-08 DIAGNOSIS — Z9861 Coronary angioplasty status: Secondary | ICD-10-CM

## 2020-10-08 DIAGNOSIS — Z8701 Personal history of pneumonia (recurrent): Secondary | ICD-10-CM

## 2020-10-08 LAB — CBC WITH DIFFERENTIAL/PLATELET
Abs Immature Granulocytes: 0.03 10*3/uL (ref 0.00–0.07)
Basophils Absolute: 0.1 10*3/uL (ref 0.0–0.1)
Basophils Relative: 1 %
Eosinophils Absolute: 0.1 10*3/uL (ref 0.0–0.5)
Eosinophils Relative: 1 %
HCT: 45.1 % (ref 39.0–52.0)
Hemoglobin: 14.4 g/dL (ref 13.0–17.0)
Immature Granulocytes: 0 %
Lymphocytes Relative: 27 %
Lymphs Abs: 2 10*3/uL (ref 0.7–4.0)
MCH: 35.6 pg — ABNORMAL HIGH (ref 26.0–34.0)
MCHC: 31.9 g/dL (ref 30.0–36.0)
MCV: 111.6 fL — ABNORMAL HIGH (ref 80.0–100.0)
Monocytes Absolute: 0.7 10*3/uL (ref 0.1–1.0)
Monocytes Relative: 10 %
Neutro Abs: 4.5 10*3/uL (ref 1.7–7.7)
Neutrophils Relative %: 61 %
Platelets: 160 10*3/uL (ref 150–400)
RBC: 4.04 MIL/uL — ABNORMAL LOW (ref 4.22–5.81)
RDW: 14.3 % (ref 11.5–15.5)
WBC: 7.5 10*3/uL (ref 4.0–10.5)
nRBC: 0 % (ref 0.0–0.2)

## 2020-10-08 LAB — COMPREHENSIVE METABOLIC PANEL
ALT: 68 U/L — ABNORMAL HIGH (ref 0–44)
AST: 58 U/L — ABNORMAL HIGH (ref 15–41)
Albumin: 3 g/dL — ABNORMAL LOW (ref 3.5–5.0)
Alkaline Phosphatase: 69 U/L (ref 38–126)
Anion gap: 10 (ref 5–15)
BUN: 50 mg/dL — ABNORMAL HIGH (ref 8–23)
CO2: 33 mmol/L — ABNORMAL HIGH (ref 22–32)
Calcium: 10.6 mg/dL — ABNORMAL HIGH (ref 8.9–10.3)
Chloride: 89 mmol/L — ABNORMAL LOW (ref 98–111)
Creatinine, Ser: 6.48 mg/dL — ABNORMAL HIGH (ref 0.61–1.24)
GFR, Estimated: 9 mL/min — ABNORMAL LOW (ref >60)
Glucose, Bld: 91 mg/dL (ref 70–99)
Potassium: 3.7 mmol/L (ref 3.5–5.1)
Sodium: 132 mmol/L — ABNORMAL LOW (ref 135–145)
Total Bilirubin: 0.7 mg/dL (ref 0.3–1.2)
Total Protein: 6.8 g/dL (ref 6.5–8.1)

## 2020-10-08 LAB — LACTIC ACID, PLASMA: Lactic Acid, Venous: 1.3 mmol/L (ref 0.5–1.9)

## 2020-10-08 LAB — GLUCOSE, CAPILLARY: Glucose-Capillary: 114 mg/dL — ABNORMAL HIGH (ref 70–99)

## 2020-10-08 MED ORDER — OXYCODONE-ACETAMINOPHEN 5-325 MG PO TABS
1.0000 | ORAL_TABLET | Freq: Once | ORAL | Status: DC
  Filled 2020-10-08: qty 1

## 2020-10-08 MED ORDER — BECLOMETHASONE DIPROPIONATE 80 MCG/ACT IN AERS: 2.0000 | INHALATION_SPRAY | Freq: Two times a day (BID) | RESPIRATORY_TRACT | Status: DC | PRN

## 2020-10-08 MED ORDER — METRONIDAZOLE 500 MG/100ML IV SOLN
500.0000 mg | Freq: Three times a day (TID) | INTRAVENOUS | Status: DC
  Administered 2020-10-08 – 2020-10-13 (×14): 500 mg via INTRAVENOUS
  Filled 2020-10-08 (×14): qty 100

## 2020-10-08 MED ORDER — SERTRALINE HCL 50 MG PO TABS
50.0000 mg | ORAL_TABLET | Freq: Every day | ORAL | Status: DC
  Administered 2020-10-09 – 2020-10-13 (×5): 50 mg via ORAL
  Filled 2020-10-08 (×5): qty 1

## 2020-10-08 MED ORDER — SODIUM CHLORIDE 0.9 % IV BOLUS
500.0000 mL | Freq: Once | INTRAVENOUS | Status: AC
  Administered 2020-10-08: 500 mL via INTRAVENOUS

## 2020-10-08 MED ORDER — DARIFENACIN HYDROBROMIDE ER 7.5 MG PO TB24
7.5000 mg | ORAL_TABLET | Freq: Every day | ORAL | Status: DC
  Administered 2020-10-09 – 2020-10-13 (×5): 7.5 mg via ORAL
  Filled 2020-10-08 (×5): qty 1

## 2020-10-08 MED ORDER — LINACLOTIDE 145 MCG PO CAPS
290.0000 ug | ORAL_CAPSULE | Freq: Every day | ORAL | Status: DC | PRN
  Filled 2020-10-08: qty 2

## 2020-10-08 MED ORDER — RENA-VITE PO TABS
1.0000 | ORAL_TABLET | Freq: Every day | ORAL | Status: DC
  Administered 2020-10-09 – 2020-10-13 (×5): 1 via ORAL
  Filled 2020-10-08 (×5): qty 1

## 2020-10-08 MED ORDER — BUPROPION HCL ER (SR) 150 MG PO TB12
150.0000 mg | ORAL_TABLET | Freq: Two times a day (BID) | ORAL | Status: DC
  Administered 2020-10-08 – 2020-10-13 (×10): 150 mg via ORAL
  Filled 2020-10-08 (×12): qty 1

## 2020-10-08 MED ORDER — TAMSULOSIN HCL 0.4 MG PO CAPS
0.4000 mg | ORAL_CAPSULE | Freq: Every day | ORAL | Status: DC
  Administered 2020-10-10 – 2020-10-12 (×3): 0.4 mg via ORAL
  Filled 2020-10-08 (×3): qty 1

## 2020-10-08 MED ORDER — CARVEDILOL 25 MG PO TABS
25.0000 mg | ORAL_TABLET | ORAL | Status: DC
  Administered 2020-10-10 – 2020-10-12 (×2): 25 mg via ORAL
  Filled 2020-10-08 (×3): qty 1

## 2020-10-08 MED ORDER — CIPROFLOXACIN IN D5W 400 MG/200ML IV SOLN: 400.0000 mg | Freq: Once | INTRAVENOUS | Status: DC

## 2020-10-08 MED ORDER — BUDESONIDE 0.25 MG/2ML IN SUSP
0.2500 mg | Freq: Two times a day (BID) | RESPIRATORY_TRACT | Status: DC
  Administered 2020-10-09 – 2020-10-13 (×5): 0.25 mg via RESPIRATORY_TRACT
  Filled 2020-10-08 (×7): qty 2

## 2020-10-08 MED ORDER — INSULIN ASPART 100 UNIT/ML IJ SOLN
0.0000 [IU] | Freq: Four times a day (QID) | INTRAMUSCULAR | Status: DC
  Administered 2020-10-12: 1 [IU] via SUBCUTANEOUS

## 2020-10-08 MED ORDER — RISPERIDONE 0.5 MG PO TABS
0.5000 mg | ORAL_TABLET | Freq: Every day | ORAL | Status: DC
  Administered 2020-10-08 – 2020-10-12 (×5): 0.5 mg via ORAL
  Filled 2020-10-08 (×6): qty 1

## 2020-10-08 MED ORDER — PROMETHAZINE HCL 25 MG PO TABS
12.5000 mg | ORAL_TABLET | Freq: Four times a day (QID) | ORAL | Status: DC | PRN
  Administered 2020-10-08: 12.5 mg via ORAL
  Filled 2020-10-08 (×2): qty 1

## 2020-10-08 MED ORDER — LACTATED RINGERS IV SOLN: INTRAVENOUS | Status: DC

## 2020-10-08 MED ORDER — SENNA 8.6 MG PO TABS: 51.6000 mg | ORAL_TABLET | Freq: Every day | ORAL | Status: DC | PRN

## 2020-10-08 MED ORDER — LACTULOSE 10 GM/15ML PO SOLN: 20.0000 g | Freq: Two times a day (BID) | ORAL | Status: DC | PRN

## 2020-10-08 MED ORDER — CARVEDILOL 25 MG PO TABS
25.0000 mg | ORAL_TABLET | ORAL | Status: DC
  Administered 2020-10-08 – 2020-10-13 (×6): 25 mg via ORAL
  Filled 2020-10-08 (×3): qty 1
  Filled 2020-10-08 (×2): qty 2
  Filled 2020-10-08: qty 1

## 2020-10-08 MED ORDER — MORPHINE SULFATE (PF) 2 MG/ML IV SOLN: 2.0000 mg | INTRAVENOUS | Status: DC | PRN

## 2020-10-08 MED ORDER — METRONIDAZOLE 500 MG/100ML IV SOLN: 500.0000 mg | Freq: Once | INTRAVENOUS | Status: DC

## 2020-10-08 MED ORDER — BISACODYL 10 MG RE SUPP: 10.0000 mg | Freq: Every day | RECTAL | Status: DC | PRN

## 2020-10-08 MED ORDER — CIPROFLOXACIN IN D5W 400 MG/200ML IV SOLN
400.0000 mg | Freq: Once | INTRAVENOUS | Status: AC
  Administered 2020-10-09: 400 mg via INTRAVENOUS
  Filled 2020-10-08: qty 200

## 2020-10-08 MED ORDER — HYDROMORPHONE HCL 1 MG/ML IJ SOLN
0.5000 mg | INTRAMUSCULAR | Status: DC | PRN
  Administered 2020-10-08: 0.5 mg via INTRAVENOUS
  Filled 2020-10-08: qty 0.5

## 2020-10-08 MED ORDER — PANTOPRAZOLE SODIUM 40 MG IV SOLR
40.0000 mg | INTRAVENOUS | Status: DC
  Administered 2020-10-09 – 2020-10-13 (×5): 40 mg via INTRAVENOUS
  Filled 2020-10-08 (×5): qty 40

## 2020-10-08 MED ORDER — ONDANSETRON HCL 4 MG/2ML IJ SOLN
4.0000 mg | Freq: Once | INTRAMUSCULAR | Status: AC
  Administered 2020-10-08: 4 mg via INTRAVENOUS
  Filled 2020-10-08: qty 2

## 2020-10-08 MED ORDER — ATORVASTATIN CALCIUM 40 MG PO TABS
40.0000 mg | ORAL_TABLET | Freq: Every day | ORAL | Status: DC
  Administered 2020-10-09 – 2020-10-13 (×5): 40 mg via ORAL
  Filled 2020-10-08 (×5): qty 1

## 2020-10-08 MED ORDER — ALBUTEROL SULFATE HFA 108 (90 BASE) MCG/ACT IN AERS
2.0000 | INHALATION_SPRAY | Freq: Four times a day (QID) | RESPIRATORY_TRACT | Status: DC | PRN
  Filled 2020-10-08: qty 6.7

## 2020-10-08 MED ORDER — ENOXAPARIN SODIUM 30 MG/0.3ML IJ SOSY
30.0000 mg | PREFILLED_SYRINGE | INTRAMUSCULAR | Status: DC
  Administered 2020-10-09 – 2020-10-10 (×2): 30 mg via SUBCUTANEOUS
  Filled 2020-10-08 (×2): qty 0.3

## 2020-10-08 MED ORDER — ROPINIROLE HCL 0.5 MG PO TABS
0.2500 mg | ORAL_TABLET | Freq: Every day | ORAL | Status: DC
  Administered 2020-10-08 – 2020-10-12 (×5): 0.25 mg via ORAL
  Filled 2020-10-08 (×5): qty 1

## 2020-10-08 MED ORDER — BUMETANIDE 1 MG PO TABS
2.0000 mg | ORAL_TABLET | Freq: Two times a day (BID) | ORAL | Status: DC
  Administered 2020-10-08 – 2020-10-09 (×2): 2 mg via ORAL
  Filled 2020-10-08 (×2): qty 2

## 2020-10-08 MED ORDER — MAGNESIUM OXIDE -MG SUPPLEMENT 400 (240 MG) MG PO TABS
400.0000 mg | ORAL_TABLET | Freq: Every day | ORAL | Status: DC
  Administered 2020-10-09 – 2020-10-13 (×5): 400 mg via ORAL
  Filled 2020-10-08 (×9): qty 1

## 2020-10-08 MED ORDER — ONDANSETRON 4 MG PO TBDP
4.0000 mg | ORAL_TABLET | Freq: Once | ORAL | Status: AC
  Administered 2020-10-08: 4 mg via ORAL
  Filled 2020-10-08: qty 1

## 2020-10-08 NOTE — ED Notes (Signed)
ED TO INPATIENT HANDOFF REPORT  ED Nurse Name and Phone #:   S Name/Age/Gender [name] 70 y.o. male Room/Bed: APFT20/APFT20  Code Status   Code Status: Prior  Home/SNF/Other Home Patient oriented to: self, place, time and situation Is this baseline? Yes   Triage Complete: Triage complete  Chief Complaint Enterocolitis [K52.9]  Triage Note Pt to the ED with complaints of right lower abdominal pain x 3 days.  Pt states he has had nausea and vomiting.     Allergies No Known Allergies  Level of Care/Admitting Diagnosis ED Disposition    ED Disposition Condition [facility] Area: [facility] [[id number]]  Level of Care: Med-Surg [16]  Covid Evaluation: Asymptomatic Screening Protocol (No Symptoms)  Diagnosis: Enterocolitis [536644]  Admitting Physician: Truett Mainland [[id number]]  Attending Physician: Truett Mainland [[id number]]       B Medical/Surgery History Past Medical History:  Diagnosis Date  . AAA (abdominal aortic aneurysm) ([place]) 06/2016   Mild increase in size of 3.4 cm infrarenal abdominal aortic aneurysm  . Anemia   . Aortic atherosclerosis ([place])   . Asthma   . Atrial flutter ([place])   . AVF (arteriovenous fistula) (H[facility])    Left forearm  . CAD (coronary artery disease)    a. s/p prior stenting of RCA in 1999 b. cath in 2003 showing moderate CAD c. NST in 2016 showing small area of ischemic and low-risk  . CHF (congestive heart failure) ([place])   . CKD (chronic kidney disease), stage V ([place])    kidney transplant evaluation  . COPD (chronic obstructive pulmonary disease) ([place])    with ongoing tobacco use and patient failed sham takes  . Diverticulosis 2018   Mild sigmoid colon diverticulosis.  Marland Kitchen DM2 (diabetes mellitus, type 2) ([place])   . Dyslipidemia   . Dysrhythmia 2018   atrial fibrillation  . Edema    chronic lower extremity secondary to right heart failure and chronic venous insufficiency  . Fatigue   . Fatty liver 2010    Mild  . Headache   . HTN (hypertension)   . Morbid obesity ([place])   . Multiple pulmonary nodules 05/2018   Bilateral  . Pneumonia   . Presence of permanent cardiac pacemaker   . 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   Marland Kitchen Reflux esophagitis    hx  . Sleep apnea    PT STATES ONE TEST WAS POSITIVE AND ANOTHER TEST WAS NEGATIVE  . Two-vessel coronary artery disease    moderate. by cath in 2003. Status post stenting of the mdi RCA November 1999 normal left ventricular ejection fraction  . Wears glasses   . Wears hearing aid    B/L   Past Surgical History:  Procedure Laterality Date  . A/V FISTULAGRAM Left 04/30/2019   Procedure: A/V FISTULAGRAM;  Surgeon: Elam Dutch, MD;  Location: [place] [facility];  Service: Cardiovascular;  Laterality: Left;  . A/V FISTULAGRAM Left 09/22/2019   Procedure: A/V FISTULAGRAM;  Surgeon: Marty Heck, MD;  Location: [place] [facility];  Service: Cardiovascular;  Laterality: Left;  . ABDOMINAL SURGERY    . APPENDECTOMY    . AV FISTULA PLACEMENT Right 03/11/2018   Procedure: CREATION Brachiocephalic Fistula RIGHT ARM;  Surgeon: Rosetta Posner, MD;  Location: [facility] OR;  Service: Vascular;  Laterality: Right;  . AV FISTULA PLACEMENT Left 05/06/2019   Procedure: LEFT BRACHIOCEPHALIC ARTERIOVENOUS (AV)  FISTULA CREATION;  Surgeon: Marty Heck, MD;  Location: [place];  Service: Vascular;  Laterality: Left;  . BACK SURGERY     x3; cages in patient's back since 2000  . BIOPSY  11/12/2018   Procedure: BIOPSY;  Surgeon: Laurence Spates, MD;  Location: [facility];  Service: Endoscopy;;  . CARDIAC CATHETERIZATION     stent  . CHOLECYSTECTOMY    . CLOSED REDUCTION SHOULDER DISLOCATION    . COLONOSCOPY W/ BIOPSIES AND POLYPECTOMY    . COLONOSCOPY WITH PROPOFOL N/A 11/12/2018   Procedure: COLONOSCOPY WITH PROPOFOL;  Surgeon: Laurence Spates, MD;  Location: [facility];  Service: Endoscopy;   Laterality: N/A;  . CORONARY ANGIOPLASTY    . diabetic ulcers    . DIAGNOSTIC LAPAROSCOPY    . DIALYSIS/PERMA CATHETER REMOVAL N/A 09/22/2019   Procedure: DIALYSIS/PERMA CATHETER REMOVAL;  Surgeon: Marty Heck, MD;  Location: [place] [facility];  Service: Cardiovascular;  Laterality: N/A;  . ESOPHAGOGASTRODUODENOSCOPY (EGD) WITH PROPOFOL N/A 11/12/2018   Procedure: ESOPHAGOGASTRODUODENOSCOPY (EGD) WITH PROPOFOL;  Surgeon: Laurence Spates, MD;  Location: [facility];  Service: Endoscopy;  Laterality: N/A;  . GROIN EXPLORATION    . HEMOSTASIS CLIP PLACEMENT  11/12/2018   Procedure: HEMOSTASIS CLIP PLACEMENT;  Surgeon: Laurence Spates, MD;  Location: [facility];  Service: Endoscopy;;  . INSERT / REPLACE / REMOVE PACEMAKER    . INSERTION OF ARTERIOVENOUS (AV) ARTEGRAFT ARM Left 03/18/2018   Procedure: INSERTION OF ARTERIOVENOUS (AV) FISTULA;  Surgeon: Rosetta Posner, MD;  Location: [place];  Service: Vascular;  Laterality: Left;  . IR FLUORO GUIDE CV LINE RIGHT  04/11/2019  . IR US GUIDE VASC ACCESS RIGHT  04/11/2019  . LEFT HEART CATH AND CORONARY ANGIOGRAPHY N/A 07/23/2019   Procedure: LEFT HEART CATH AND CORONARY ANGIOGRAPHY;  Surgeon: Martinique, Peter M, MD;  Location: [place] [facility];  Service: Cardiovascular;  Laterality: N/A;  . LIGATION ARTERIOVENOUS GORTEX GRAFT Right 03/18/2018   Procedure: LIGATION ARTERIOVENOUS FISTULA;  Surgeon: Rosetta Posner, MD;  Location: [place];  Service: Vascular;  Laterality: Right;  . OSTECTOMY Left 04/23/2017   Procedure: OSTECTOMY LEFT GREAT TOE;  Surgeon: [name], DPM;  Location: [facility];  Service: Podiatry;  Laterality: Left;  left great toe  . POLYPECTOMY  11/12/2018   Procedure: POLYPECTOMY;  Surgeon: Laurence Spates, MD;  Location: [facility];  Service: Endoscopy;;  . TUMOR REMOVAL     small intestine x3  . VISCERAL ANGIOGRAPHY N/A 08/02/2019   Procedure: VISCERAL ANGIOGRAPHY;  Surgeon: Waynetta Sandy, MD;  Location: [place] [place] [facility];  Service: Cardiovascular;  Laterality: N/A;     A IV Location/Drains/Wounds Patient Lines/Drains/Airways Status    Active Line/Drains/Airways    Name Placement date Placement time Site Days   Peripheral IV 10/08/20 Right Antecubital 10/08/20  1938  Antecubital  less than 1   Fistula / Graft Left Forearm Arteriovenous fistula 05/06/19  1147  Forearm  521   Hemodialysis Catheter Right Internal jugular Double lumen Permanent (Tunneled) 04/11/19  1209  Internal jugular  546   Incision (Closed) 04/23/17 Foot Left 04/23/17  0806  -- 1264   Incision (Closed) 03/11/18 Arm Right 03/11/18  0758  -- 942   Incision (Closed) 03/18/18 Arm Right 03/18/18  2048  -- 935   Incision (Closed) 03/18/18 Arm Left 03/18/18  2048  -- 935   Incision (Closed) 05/06/19 Arm Left 05/06/19  1154  -- 521   Wound / Incision (Open or Dehisced) 04/05/16 Diabetic ulcer  Foot Right 04/05/16  0000  Foot  1647   Wound / Incision (Open or Dehisced) 04/05/16 Diabetic ulcer Foot Left 04/05/16  0000  Foot  1647   Wound / Incision (Open or Dehisced) 04/05/16 Diabetic ulcer Toe (Comment  which one) Left 04/05/16  0000  Toe (Comment  which one)  1647          Intake/Output Last 24 hours No intake or output data in the 24 hours ending 10/08/20 2136  Labs/Imaging Results for orders placed or performed during the hospital encounter of 10/08/20 (from the past 48 hour(s))  CBC with Differential/Platelet     Status: Abnormal   Collection Time: 10/08/20  5:25 PM  Result Value Ref Range   WBC 7.5 4.0 - 10.5 K/uL   RBC 4.04 (L) 4.22 - 5.81 MIL/uL   Hemoglobin 14.4 13.0 - 17.0 g/dL   HCT 45.1 39.0 - 52.0 %   MCV 111.6 (H) 80.0 - 100.0 fL   MCH 35.6 (H) 26.0 - 34.0 pg   MCHC 31.9 30.0 - 36.0 g/dL   RDW 14.3 11.5 - 15.5 %   Platelets 160 150 - 400 K/uL   nRBC 0.0 0.0 - 0.2 %   Neutrophils Relative % 61 %   Neutro Abs 4.5 1.7 - 7.7 K/uL   Lymphocytes Relative 27 %   Lymphs Abs 2.0 0.7 - 4.0 K/uL   Monocytes Relative 10 %    Monocytes Absolute 0.7 0.1 - 1.0 K/uL   Eosinophils Relative 1 %   Eosinophils Absolute 0.1 0.0 - 0.5 K/uL   Basophils Relative 1 %   Basophils Absolute 0.1 0.0 - 0.1 K/uL   Immature Granulocytes 0 %   Abs Immature Granulocytes 0.03 0.00 - 0.07 K/uL    Comment: Performed at [facility], [address] [place], [place] [address]  Comprehensive metabolic panel     Status: Abnormal   Collection Time: 10/08/20  5:25 PM  Result Value Ref Range   Sodium 132 (L) 135 - 145 mmol/L   Potassium 3.7 3.5 - 5.1 mmol/L   Chloride 89 (L) 98 - 111 mmol/L   CO2 33 (H) 22 - 32 mmol/L   Glucose, Bld 91 70 - 99 mg/dL    Comment: Glucose reference range applies only to samples taken after fasting for at least 8 hours.   BUN 50 (H) 8 - 23 mg/dL   Creatinine, Ser 6.48 (H) 0.61 - 1.24 mg/dL   Calcium 10.6 (H) 8.9 - 10.3 mg/dL   Total Protein 6.8 6.5 - 8.1 g/dL   Albumin 3.0 (L) 3.5 - 5.0 g/dL   AST 58 (H) 15 - 41 U/L   ALT 68 (H) 0 - 44 U/L   Alkaline Phosphatase 69 38 - 126 U/L   Total Bilirubin 0.7 0.3 - 1.2 mg/dL   GFR, Estimated 9 (L) >60 mL/min    Comment: (NOTE) Calculated using the CKD-EPI Creatinine Equation (2021)    Anion gap 10 5 - 15    Comment: Performed at [facility], [address], [place], [place] [address]  Lactic acid, plasma     Status: None   Collection Time: 10/08/20  7:34 PM  Result Value Ref Range   Lactic Acid, Venous 1.3 0.5 - 1.9 mmol/L    Comment: Performed at [facility], [address] [place],  [address]   CT ABDOMEN PELVIS WO CONTRAST  Result Date: 10/08/2020 CLINICAL DATA:  Acute abdominal pain. Nausea and vomiting. Patient reports right lower abdominal  pain for 3 days. EXAM: CT ABDOMEN AND PELVIS WITHOUT CONTRAST TECHNIQUE: Multidetector CT imaging of the abdomen and pelvis was performed following the standard protocol without IV contrast. COMPARISON:  CT 07/28/2019 FINDINGS: Lower chest: Trace right pleural effusion with streaky and patchy right basilar  airspace disease. Normal heart size. Coronary artery calcifications. Pacemaker partially visualized. No pericardial effusion. Hepatobiliary: No evidence of focal liver abnormality. Slight nodular contours again seen. Cholecystectomy. No biliary dilatation. Pancreas: Parenchymal atrophy. No ductal dilatation or inflammation. Spleen: Normal in size without focal abnormality. Adrenals/Urinary Tract: Minimal left adrenal thickening. No adrenal nodule bilaterally. Bilateral renal parenchymal thinning. No hydronephrosis. Vascular calcifications, no renal calculi. Exophytic 14 mm slightly hyperdense lesion from the upper right kidney is not significantly changed from prior. Partially distended urinary bladder. Stomach/Bowel: Mild distal esophageal wall thickening. Unremarkable unenhanced stomach. Occasional fluid-filled small bowel without obstruction or inflammation. Appendix not visualized, appendectomy per history. Mild wall thickening of the ascending colon. Mixed liquid and solid stool in the transverse colon. Formed stool distally. No pericolonic inflammation. Vascular/Lymphatic: Advanced aortic and branch atherosclerosis. Infrarenal aortic aneurysm with maximal dimension 3.7 cm, unchanged. No periaortic stranding. Mild right common iliac aneurysmal dilatation at 2 cm. No bulky abdominopelvic adenopathy. Reproductive: Prostate is unremarkable. Other: Small volume abdominopelvic ascites. Peritoneal dialysis catheter enters right lower abdomen, courses into the pelvis with tip in the right lower quadrant. There is no adjacent fluid collection or inflammation. No free air. No abdominal wall hernia. Musculoskeletal: L2 compression fracture with vertebral augmentation, stable from October 2021 lumbar spine CT. Postsurgical change in the lower lumbar spine. Bones diffusely under mineralized. Surgical hardware in the left proximal femur. There are no acute or suspicious osseous abnormalities. IMPRESSION: 1. Mild wall  thickening of the ascending colon, can be seen with colitis. 2. Peritoneal dialysis catheter with tip in the right lower quadrant. No adjacent fluid collection or inflammation. Small volume abdominopelvic ascites. 3. Trace right pleural effusion with streaky and patchy right basilar airspace disease, favor atelectasis over pneumonia. 4. Nodular hepatic contours suggesting cirrhosis. Small volume abdominopelvic ascites. 5. Stable infrarenal aortic aneurysm, maximal dimension 3.7 cm. Recommend follow-up every 2 years. This recommendation follows ACR consensus guidelines: White Paper of the ACR Incidental Findings Committee II on Vascular Findings. J Am Coll Radiol 2013; 10:789-794. Aortic Atherosclerosis (ICD10-I70.0). Electronically Signed   By: Keith Rake M.D.   On: 10/08/2020 17:36    Pending Labs Unresulted Labs (From admission, onward)          Start     Ordered   10/08/20 2101  SARS CORONAVIRUS 2 (TAT 6-24 HRS) Nasopharyngeal Nasopharyngeal Swab  (Tier 3 - Symptomatic/asymptomatic)  Once,   STAT       Question Answer Comment  Is this test for diagnosis or screening Screening   Symptomatic for COVID-19 as defined by CDC No   Hospitalized for COVID-19 No   Admitted to ICU for COVID-19 No   Previously tested for COVID-19 Yes   Resident in a congregate (group) care setting No   Employed in healthcare setting No   Has patient completed COVID vaccination(s) (2 doses of Pfizer/Moderna 1 dose of [employer]) Unknown      10/08/20 2101   Signed and Held  HIV Antibody (routine testing w rflx)  (HIV Antibody (Routine testing w reflex) panel)  Once,   R        Signed and Held   Signed and Held  Hemoglobin A1c  Once,   R  Comments: To assess prior glycemic control    Signed and Held   Signed and Held  CBC  Tomorrow morning,   STAT        Signed and Held          Vitals/Pain Today's Vitals   10/08/20 1647 10/08/20 1942  BP:  (!) 155/105  Pulse:  62  Resp:  19  Temp:  (!)  97.3 F (36.3 C)  TempSrc:  Oral  SpO2:  94%  Weight: 94.3 kg   Height: 6\' 4"  (1.93 m)   PainSc: 7  8     Isolation Precautions No active isolations  Medications Medications  metroNIDAZOLE (FLAGYL) IVPB 500 mg (has no administration in time range)  HYDROmorphone (DILAUDID) injection 0.5 mg (has no administration in time range)  ciprofloxacin (CIPRO) IVPB 400 mg (has no administration in time range)  ondansetron (ZOFRAN-ODT) disintegrating tablet 4 mg (4 mg Oral Given 10/08/20 1829)  sodium chloride 0.9 % bolus 500 mL (500 mLs Intravenous New Bag/Given (Non-Interop) 10/08/20 1939)  ondansetron (ZOFRAN) injection 4 mg (4 mg Intravenous Given 10/08/20 1940)    Mobility walks Moderate fall risk   Focused Assessments    R Recommendations: See Admitting Provider Note  Report given to:   Additional Notes:

## 2020-10-08 NOTE — ED Triage Notes (Signed)
Pt to the ED with complaints of right lower abdominal pain x 3 days.  Pt states he has had nausea and vomiting.

## 2020-10-08 NOTE — ED Provider Notes (Signed)
[facility] EMERGENCY DEPARTMENT Provider Note   CSN: [id number] Arrival date & time: 10/08/20  1505     History Chief Complaint  Patient presents with  . Abdominal Pain    [name] is a 70 y.o. male.  Patient complains of right lower quadrant abdominal pain.  Patient also has been vomiting.  He is a peritoneal dialysis patient.  The history is provided by the patient and medical records. No language interpreter was used.  Abdominal Pain Pain location:  Generalized Pain quality: aching   Pain radiates to:  Does not radiate Pain severity:  Moderate Onset quality:  Sudden Timing:  Constant Progression:  Worsening Chronicity:  New Context: not alcohol use   Relieved by:  Nothing Associated symptoms: no chest pain, no cough, no diarrhea, no fatigue and no hematuria        Past Medical History:  Diagnosis Date  . AAA (abdominal aortic aneurysm) ([place]) 06/2016   Mild increase in size of 3.4 cm infrarenal abdominal aortic aneurysm  . Anemia   . Aortic atherosclerosis ([place])   . Asthma   . Atrial flutter ([place])   . AVF (arteriovenous fistula) (HCC)    Left forearm  . CAD (coronary artery disease)    a. s/p prior stenting of RCA in 1999 b. cath in 2003 showing moderate CAD c. NST in 2016 showing small area of ischemic and low-risk  . CHF (congestive heart failure) ([place])   . CKD (chronic kidney disease), stage V ([place])    kidney transplant evaluation  . COPD (chronic obstructive pulmonary disease) ([place])    with ongoing tobacco use and patient failed sham takes  . Diverticulosis 2018   Mild sigmoid colon diverticulosis.  Marland Kitchen DM2 (diabetes mellitus, type 2) ([place])   . Dyslipidemia   . Dysrhythmia 2018   atrial fibrillation  . Edema    chronic lower extremity secondary to right heart failure and chronic venous insufficiency  . Fatigue   . Fatty liver 2010   Mild  . Headache   . HTN (hypertension)   . Morbid obesity ([place])   . Multiple pulmonary nodules 05/2018    Bilateral  . Pneumonia   . Presence of permanent cardiac pacemaker   . 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   Marland Kitchen Reflux esophagitis    hx  . Sleep apnea    PT STATES ONE TEST WAS POSITIVE AND ANOTHER TEST WAS NEGATIVE  . Two-vessel coronary artery disease    moderate. by cath in 2003. Status post stenting of the mdi RCA November 1999 normal left ventricular ejection fraction  . Wears glasses   . Wears hearing aid    B/L    Patient Active Problem List   Diagnosis Date Noted  . Enterocolitis 10/08/2020  . OAB (overactive bladder) 06/16/2020  . Recurrent UTI 06/16/2020  . Incomplete emptying of bladder 03/17/2020  . Benign prostatic hyperplasia with urinary obstruction 03/17/2020  . Difficulty urinating 02/16/2020  . NSTEMI (non-ST elevated myocardial infarction) ([place]) 07/22/2019  . Renal mass 07/14/2019  . Renal anasarca   . ESRD (end stage renal disease) ([place]) 04/10/2019  . 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[place]) 03/18/2018  . Chronic renal insufficiency, stage 3 (moderate) ([place]) 03/18/2018  . Diffuse wheezing 12/02/2017  . OSA and COPD overlap syndrome ([place]) 12/02/2017  . Sleeps in sitting position due to orthopnea 12/02/2017  . Pacemaker 12/02/2017  . Bradycardia with 31-40 beats per minute 12/02/2017  .  Coronary artery disease due to calcified coronary lesion 12/02/2017  . Insomnia due to medical condition 12/02/2017  . Snoring 12/02/2017  . Chronic diastolic CHF (congestive heart failure) ([place]) 05/10/2017  . Atypical chest pain   . Chest pain 05/09/2017  . Atrial flutter ([place]) 05/09/2017  . Acute renal failure superimposed on stage 4 chronic kidney disease ([place]) 05/09/2017  . Hypokalemia 04/05/2016  . Diabetes mellitus with nephropathy ([place]) 04/05/2016  . Hyponatremia 04/05/2016  . Hematuria 04/05/2016  . Diarrhea 04/05/2016  . Bilateral diabetic foot ulcer associated with secondary diabetes mellitus ([place])  04/05/2016  . Streptococcal bacteremia 04/05/2016  . Cellulitis of leg, left 04/05/2016  . Sepsis ([place]) 04/04/2016  . CAD (coronary artery disease) 11/10/2015  . Bilateral lower extremity edema 07/25/2015  . Foot infection 09/27/2014  . Diabetic foot infection ([place]) 09/27/2014  . CKD stage 3 due to type 2 diabetes mellitus ([place])   . Diabetes mellitus with renal manifestations, uncontrolled ([place])   . Benign essential HTN   . ED (erectile dysfunction) 06/09/2012  . Hypertension 03/11/2011  . OSTEOMYELITIS, ACUTE, ANKLE/FOOT 07/24/2010  . EDEMA 02/15/2010  . MORBID OBESITY 07/05/2009  . TOBACCO ABUSE 07/05/2009  . OBSTRUCTIVE SLEEP APNEA 07/05/2009  . COPD (chronic obstructive pulmonary disease) ([place]) 07/05/2009  . 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 HYPERLIPIDEMIA 03/30/2008  . Generalized anxiety disorder 03/30/2008  . [place] DISEASE 03/30/2008    Past Surgical History:  Procedure Laterality Date  . A/V FISTULAGRAM Left 04/30/2019   Procedure: A/V FISTULAGRAM;  Surgeon: Elam Dutch, MD;  Location: [place] [facility];  Service: Cardiovascular;  Laterality: Left;  . A/V FISTULAGRAM Left 09/22/2019   Procedure: A/V FISTULAGRAM;  Surgeon: Marty Heck, MD;  Location: [place] [facility];  Service: Cardiovascular;  Laterality: Left;  . ABDOMINAL SURGERY    . APPENDECTOMY    . AV FISTULA PLACEMENT Right 03/11/2018   Procedure: CREATION Brachiocephalic Fistula RIGHT ARM;  Surgeon: Rosetta Posner, MD;  Location: [facility];  Service: Vascular;  Laterality: Right;  . AV FISTULA PLACEMENT Left 05/06/2019   Procedure: LEFT BRACHIOCEPHALIC ARTERIOVENOUS (AV) FISTULA CREATION;  Surgeon: Marty Heck, MD;  Location: [place];  Service: Vascular;  Laterality: Left;  . BACK SURGERY     x3; cages in patient's back since 2000  . BIOPSY  11/12/2018   Procedure: BIOPSY;  Surgeon: Laurence Spates, MD;  Location: [facility];  Service: Endoscopy;;  . CARDIAC CATHETERIZATION     stent  .  CHOLECYSTECTOMY    . CLOSED REDUCTION SHOULDER DISLOCATION    . COLONOSCOPY W/ BIOPSIES AND POLYPECTOMY    . COLONOSCOPY WITH PROPOFOL N/A 11/12/2018   Procedure: COLONOSCOPY WITH PROPOFOL;  Surgeon: Laurence Spates, MD;  Location: [facility];  Service: Endoscopy;  Laterality: N/A;  . CORONARY ANGIOPLASTY    . diabetic ulcers    . DIAGNOSTIC LAPAROSCOPY    . DIALYSIS/PERMA CATHETER REMOVAL N/A 09/22/2019   Procedure: DIALYSIS/PERMA CATHETER REMOVAL;  Surgeon: Marty Heck, MD;  Location: [place] [facility];  Service: Cardiovascular;  Laterality: N/A;  . ESOPHAGOGASTRODUODENOSCOPY (EGD) WITH PROPOFOL N/A 11/12/2018   Procedure: ESOPHAGOGASTRODUODENOSCOPY (EGD) WITH PROPOFOL;  Surgeon: Laurence Spates, MD;  Location: [facility];  Service: Endoscopy;  Laterality: N/A;  . GROIN EXPLORATION    . HEMOSTASIS CLIP PLACEMENT  11/12/2018   Procedure: HEMOSTASIS CLIP PLACEMENT;  Surgeon: Laurence Spates, MD;  Location: [facility];  Service: Endoscopy;;  . INSERT / REPLACE / REMOVE PACEMAKER    . INSERTION OF ARTERIOVENOUS (AV)  ARTEGRAFT ARM Left 03/18/2018   Procedure: INSERTION OF ARTERIOVENOUS (AV) FISTULA;  Surgeon: Rosetta Posner, MD;  Location: [place];  Service: Vascular;  Laterality: Left;  . IR FLUORO GUIDE CV LINE RIGHT  04/11/2019  . IR US GUIDE VASC ACCESS RIGHT  04/11/2019  . LEFT HEART CATH AND CORONARY ANGIOGRAPHY N/A 07/23/2019   Procedure: LEFT HEART CATH AND CORONARY ANGIOGRAPHY;  Surgeon: Martinique, Peter M, MD;  Location: [place] [facility];  Service: Cardiovascular;  Laterality: N/A;  . LIGATION ARTERIOVENOUS GORTEX GRAFT Right 03/18/2018   Procedure: LIGATION ARTERIOVENOUS FISTULA;  Surgeon: Rosetta Posner, MD;  Location: [place];  Service: Vascular;  Laterality: Right;  . OSTECTOMY Left 04/23/2017   Procedure: OSTECTOMY LEFT GREAT TOE;  Surgeon: [name], DPM;  Location: [facility];  Service: Podiatry;  Laterality: Left;  left great toe  . POLYPECTOMY  11/12/2018   Procedure:  POLYPECTOMY;  Surgeon: Laurence Spates, MD;  Location: [facility];  Service: Endoscopy;;  . TUMOR REMOVAL     small intestine x3  . VISCERAL ANGIOGRAPHY N/A 08/02/2019   Procedure: VISCERAL ANGIOGRAPHY;  Surgeon: Waynetta Sandy, MD;  Location: [place] [facility];  Service: Cardiovascular;  Laterality: N/A;       Family History  Problem Relation Age of Onset  . Diabetes Mother   . Alcohol abuse Father     Social History   Tobacco Use  . Smoking status: Former Smoker    Packs/day: 1.00    Years: 40.00    Pack years: 40.00    Types: Cigarettes    Start date: 05/20/1968    Quit date: 11/30/2019    Years since quitting: 0.8  . Smokeless tobacco: Never Used  Vaping Use  . Vaping Use: Never used  Substance Use Topics  . Alcohol use: Not Currently    Alcohol/week: 0.0 standard drinks    Comment: rare  . Drug use: No    Home Medications Prior to Admission medications   Medication Sig Start Date End Date Taking? Authorizing Provider  albuterol (VENTOLIN HFA) 108 (90 Base) MCG/ACT inhaler Inhale 2 puffs into the lungs every 6 (six) hours as needed for wheezing or shortness of breath.     [provider]  aspirin EC 81 MG EC tablet Take 1 tablet (81 mg total) by mouth daily. 07/25/19   Isaac Bliss, Rayford Halsted, MD  atorvastatin (LIPITOR) 40 MG tablet Take 1 tablet by mouth once daily 08/24/20   Arnoldo Lenis, MD  beclomethasone (QVAR) 80 MCG/ACT inhaler Inhale 2 puffs into the lungs 2 (two) times daily as needed (shortness of breath).    [provider]  bumetanide (BUMEX) 2 MG tablet Take 2 mg by mouth 2 (two) times daily. 07/15/19   [provider]  buPROPion (WELLBUTRIN SR) 150 MG 12 hr tablet Take 150 mg by mouth 2 (two) times daily.    [provider]  carvedilol (COREG) 25 MG tablet Take 1 tablet (25 mg total) by mouth See admin instructions. Take 25 mg twice daily on Sun, Mon, Wed, and Fri. Take 25 mg in the evening on Tues, Thurs,  and Sat (dialysis days) 11/23/19   Arnoldo Lenis, MD  Cholecalciferol (VITAMIN D3) 2000 units TABS Take 2,000 Units by mouth daily.     [provider]  denosumab (PROLIA) 60 MG/ML SOSY injection Inject 60 mg into the skin every 6 (six) months.    [provider]  Docusate Sodium 50 MG/15ML LIQD 2 capsule  as needed    [provider]  lactulose (CHRONULAC) 10 GM/15ML solution Take 20 g by mouth 2 (two) times daily as needed for mild constipation.    [provider]  linaclotide (LINZESS) 290 MCG CAPS capsule Take 290 mcg by mouth daily as needed (constipation).    [provider]  magnesium oxide (MAG-OX) 400 MG tablet Take 400 mg by mouth daily.    [provider]  Melatonin 10 MG TABS Take 10 mg by mouth at bedtime as needed (sleep).     [provider]  multivitamin (RENA-VIT) TABS tablet Take 1 tablet by mouth daily.    [provider]  nitrofurantoin (MACRODANTIN) 50 MG capsule Take 1 capsule (50 mg total) by mouth at bedtime. 06/16/20   McKenzie, Candee Furbish, MD  nitrofurantoin, macrocrystal-monohydrate, (MACROBID) 100 MG capsule Take 1 capsule (100 mg total) by mouth every 12 (twelve) hours. Patient not taking: Reported on 09/25/2020 08/25/20   Cleon Gustin, MD  nitrofurantoin, macrocrystal-monohydrate, (MACROBID) 100 MG capsule Take 1 capsule (100 mg total) by mouth at bedtime. 09/15/20   McKenzie, Candee Furbish, MD  nitrofurantoin, macrocrystal-monohydrate, (MACROBID) 100 MG capsule Take 1 capsule (100 mg total) by mouth daily. 09/25/20   McKenzie, Candee Furbish, MD  nitroGLYCERIN (NITROSTAT) 0.4 MG SL tablet Place 1 tablet (0.4 mg total) under the tongue every 5 (five) minutes as needed for chest pain. 08/04/19 02/27/21  Arnoldo Lenis, MD  omeprazole (PRILOSEC) 20 MG capsule Take 20 mg by mouth daily.     [provider]  ondansetron (ZOFRAN) 4 MG tablet Take 1 tablet (4 mg total) by mouth every 8 (eight) hours as  needed for nausea or vomiting. 10/06/20   McKenzie, Candee Furbish, MD  polyethylene glycol (MIRALAX / GLYCOLAX) packet Take 17 g by mouth daily as needed for mild constipation. Mix in morning coffee    [provider]  risperiDONE (RISPERDAL) 0.5 MG tablet Take 0.5 mg by mouth at bedtime.    [provider]  ROPINIROLE HCL PO Take 0.25 mg by mouth at bedtime. 05/29/20   [provider]  Sennosides 25 MG TABS Take 50 mg by mouth daily as needed (constipation).     [provider]  sertraline (ZOLOFT) 50 MG tablet Take 1 tablet by mouth daily. 09/22/20   [provider]  sildenafil (VIAGRA) 100 MG tablet Take 100 mg by mouth daily. 05/29/20   [provider]  solifenacin (VESICARE) 5 MG tablet Take 1 tablet (5 mg total) by mouth daily. 07/12/20   McKenzie, Candee Furbish, MD  tadalafil (CIALIS) 20 MG tablet Take 1 tablet (20 mg total) by mouth daily as needed. 06/16/20   McKenzie, Candee Furbish, MD  tamsulosin (FLOMAX) 0.4 MG CAPS capsule Take 1 capsule (0.4 mg total) by mouth daily after supper. 03/17/20   McKenzie, Candee Furbish, MD  tiZANidine (ZANAFLEX) 2 MG tablet Take 2 mg by mouth at bedtime. 03/06/20   [provider]  traMADol (ULTRAM) 50 MG tablet Take by mouth every 6 (six) hours as needed.    [provider]    Allergies    Patient has no known allergies.  Review of Systems   Review of Systems  Constitutional: Negative for appetite change and fatigue.  HENT: Negative for congestion, ear discharge and sinus pressure.   Eyes: Negative for discharge.  Respiratory: Negative for cough.   Cardiovascular: Negative for chest pain.  Gastrointestinal: Positive for abdominal pain. Negative for diarrhea.  Genitourinary: Negative  for frequency and hematuria.  Musculoskeletal: Negative for back pain.  Skin: Negative for rash.  Neurological: Negative for seizures and headaches.  Psychiatric/Behavioral: Negative for hallucinations.    Physical  Exam Updated Vital Signs BP (!) 155/105 (BP Location: Right Arm)   Pulse 62   Temp (!) 97.3 F (36.3 C) (Oral)   Resp 19   Ht 6\' 4"  (1.93 m)   Wt 94.3 kg   SpO2 94%   BMI 25.32 kg/m   Physical Exam Vitals and nursing note reviewed.  Constitutional:      Appearance: He is well-developed.  HENT:     Head: Normocephalic.     Nose: Nose normal.  Eyes:     General: No scleral icterus.    Conjunctiva/sclera: Conjunctivae normal.  Neck:     Thyroid: No thyromegaly.  Cardiovascular:     Rate and Rhythm: Normal rate and regular rhythm.     Heart sounds: No murmur heard. No friction rub. No gallop.   Pulmonary:     Breath sounds: No stridor. No wheezing or rales.  Chest:     Chest wall: No tenderness.  Abdominal:     General: There is no distension.     Tenderness: There is abdominal tenderness. There is no rebound.  Musculoskeletal:        General: Normal range of motion.     Cervical back: Neck supple.  Lymphadenopathy:     Cervical: No cervical adenopathy.  Skin:    Findings: No erythema or rash.  Neurological:     Mental Status: He is alert and oriented to person, place, and time.     Motor: No abnormal muscle tone.     Coordination: Coordination normal.  Psychiatric:        Behavior: Behavior normal.     ED Results / Procedures / Treatments   Labs (all labs ordered are listed, but only abnormal results are displayed) Labs Reviewed  CBC WITH DIFFERENTIAL/PLATELET - Abnormal; Notable for the following components:      Result Value   RBC 4.04 (*)    MCV 111.6 (*)    MCH 35.6 (*)    All other components within normal limits  COMPREHENSIVE METABOLIC PANEL - Abnormal; Notable for the following components:   Sodium 132 (*)    Chloride 89 (*)    CO2 33 (*)    BUN 50 (*)    Creatinine, Ser 6.48 (*)    Calcium 10.6 (*)    Albumin 3.0 (*)    AST 58 (*)    ALT 68 (*)    GFR, Estimated 9 (*)    All other components within normal limits  LACTIC ACID, PLASMA     EKG None  Radiology CT ABDOMEN PELVIS WO CONTRAST  Result Date: 10/08/2020 CLINICAL DATA:  Acute abdominal pain. Nausea and vomiting. Patient reports right lower abdominal pain for 3 days. EXAM: CT ABDOMEN AND PELVIS WITHOUT CONTRAST TECHNIQUE: Multidetector CT imaging of the abdomen and pelvis was performed following the standard protocol without IV contrast. COMPARISON:  CT 07/28/2019 FINDINGS: Lower chest: Trace right pleural effusion with streaky and patchy right basilar airspace disease. Normal heart size. Coronary artery calcifications. Pacemaker partially visualized. No pericardial effusion. Hepatobiliary: No evidence of focal liver abnormality. Slight nodular contours again seen. Cholecystectomy. No biliary dilatation. Pancreas: Parenchymal atrophy. No ductal dilatation or inflammation. Spleen: Normal in size without focal abnormality. Adrenals/Urinary Tract: Minimal left adrenal thickening. No adrenal nodule bilaterally. Bilateral renal parenchymal thinning. No hydronephrosis. Vascular  calcifications, no renal calculi. Exophytic 14 mm slightly hyperdense lesion from the upper right kidney is not significantly changed from prior. Partially distended urinary bladder. Stomach/Bowel: Mild distal esophageal wall thickening. Unremarkable unenhanced stomach. Occasional fluid-filled small bowel without obstruction or inflammation. Appendix not visualized, appendectomy per history. Mild wall thickening of the ascending colon. Mixed liquid and solid stool in the transverse colon. Formed stool distally. No pericolonic inflammation. Vascular/Lymphatic: Advanced aortic and branch atherosclerosis. Infrarenal aortic aneurysm with maximal dimension 3.7 cm, unchanged. No periaortic stranding. Mild right common iliac aneurysmal dilatation at 2 cm. No bulky abdominopelvic adenopathy. Reproductive: Prostate is unremarkable. Other: Small volume abdominopelvic ascites. Peritoneal dialysis catheter enters right lower  abdomen, courses into the pelvis with tip in the right lower quadrant. There is no adjacent fluid collection or inflammation. No free air. No abdominal wall hernia. Musculoskeletal: L2 compression fracture with vertebral augmentation, stable from October 2021 lumbar spine CT. Postsurgical change in the lower lumbar spine. Bones diffusely under mineralized. Surgical hardware in the left proximal femur. There are no acute or suspicious osseous abnormalities. IMPRESSION: 1. Mild wall thickening of the ascending colon, can be seen with colitis. 2. Peritoneal dialysis catheter with tip in the right lower quadrant. No adjacent fluid collection or inflammation. Small volume abdominopelvic ascites. 3. Trace right pleural effusion with streaky and patchy right basilar airspace disease, favor atelectasis over pneumonia. 4. Nodular hepatic contours suggesting cirrhosis. Small volume abdominopelvic ascites. 5. Stable infrarenal aortic aneurysm, maximal dimension 3.7 cm. Recommend follow-up every 2 years. This recommendation follows ACR consensus guidelines: White Paper of the ACR Incidental Findings Committee II on Vascular Findings. J Am Coll Radiol 2013; 10:789-794. Aortic Atherosclerosis (ICD10-I70.0). Electronically Signed   By: Keith Rake M.D.   On: 10/08/2020 17:36    Procedures Procedures   Medications Ordered in ED Medications  oxyCODONE-acetaminophen (PERCOCET/ROXICET) 5-325 MG per tablet 1 tablet (1 tablet Oral Not Given 10/08/20 1830)  metroNIDAZOLE (FLAGYL) IVPB 500 mg (has no administration in time range)  ciprofloxacin (CIPRO) IVPB 400 mg (has no administration in time range)  ondansetron (ZOFRAN-ODT) disintegrating tablet 4 mg (4 mg Oral Given 10/08/20 1829)  sodium chloride 0.9 % bolus 500 mL (500 mLs Intravenous New Bag/Given (Non-Interop) 10/08/20 1939)  ondansetron (ZOFRAN) injection 4 mg (4 mg Intravenous Given 10/08/20 1940)    ED Course  I have reviewed the triage vital signs and the  nursing notes.  Pertinent labs & imaging results that were available during my care of the patient were reviewed by me and considered in my medical decision making (see chart for details). I spoke with nephrology Dr. Justin Mend and he stated the patient did not need dialysis tonight and he will be seen tomorrow   MDM Rules/Calculators/A&P                          Patient with abdominal pain and colitis.  He will be admitted to medicine started on antibiotics and nephrology will see the patient Final Clinical Impression(s) / ED Diagnoses Final diagnoses:  Colitis    Rx / DC Orders ED Discharge Orders    None       Milton Ferguson, MD 10/08/20 2009

## 2020-10-08 NOTE — H&P (Signed)
History and Physical  ABSALOM ARO MBE:[id number] DOB: May 29, 1950 DOA: 10/08/2020  Referring physician: Dr Roderic Palau, ED physician PCP: Tobe Sos, MD  Outpatient Specialists:   Patient Coming From: home  Chief Complaint: abdominal pain, nausea, vomiting  HPI: WRAY GOEHRING is a 70 y.o. male with a history of diabetes, end-stage renal disease on peritoneal dialysis, COPD, coronary artery disease with history of NSTEMI status post stenting, CHF, hypertension.  Patient seen for severe right-sided abdominal pain that started 2 to 3 days ago.  This pain has been worsening and nonradiating.  Pain is very severe.  He has been intolerant of oral food and liquid and immediately vomits.  He has been able to keep some of his medication down over the past day.  No palliating or provoking factors.  Emergency Department Course: CT of abdomen shows right a sending colitis.  White count normal.  Review of Systems:   Pt denies any fevers, chills, diarrhea, constipation, shortness of breath, dyspnea on exertion, orthopnea, cough, wheezing, palpitations, headache, vision changes, lightheadedness, dizziness, melena, rectal bleeding.  Review of systems are otherwise negative  Past Medical History:  Diagnosis Date  . AAA (abdominal aortic aneurysm) ([place]) 06/2016   Mild increase in size of 3.4 cm infrarenal abdominal aortic aneurysm  . Anemia   . Aortic atherosclerosis ([place])   . Asthma   . Atrial flutter ([place])   . AVF (arteriovenous fistula) (HCC)    Left forearm  . CAD (coronary artery disease)    a. s/p prior stenting of RCA in 1999 b. cath in 2003 showing moderate CAD c. NST in 2016 showing small area of ischemic and low-risk  . CHF (congestive heart failure) ([place])   . CKD (chronic kidney disease), stage V ([place])    kidney transplant evaluation  . COPD (chronic obstructive pulmonary disease) ([place])    with ongoing tobacco use and patient failed sham takes  . Diverticulosis 2018   Mild  sigmoid colon diverticulosis.  Marland Kitchen DM2 (diabetes mellitus, type 2) ([place])   . Dyslipidemia   . Dysrhythmia 2018   atrial fibrillation  . Edema    chronic lower extremity secondary to right heart failure and chronic venous insufficiency  . Fatigue   . Fatty liver 2010   Mild  . Headache   . HTN (hypertension)   . Morbid obesity ([place])   . Multiple pulmonary nodules 05/2018   Bilateral  . Pneumonia   . Presence of permanent cardiac pacemaker   . 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   Marland Kitchen Reflux esophagitis    hx  . Sleep apnea    PT STATES ONE TEST WAS POSITIVE AND ANOTHER TEST WAS NEGATIVE  . Two-vessel coronary artery disease    moderate. by cath in 2003. Status post stenting of the mdi RCA November 1999 normal left ventricular ejection fraction  . Wears glasses   . Wears hearing aid    B/L   Past Surgical History:  Procedure Laterality Date  . A/V FISTULAGRAM Left 04/30/2019   Procedure: A/V FISTULAGRAM;  Surgeon: Elam Dutch, MD;  Location: [facility];  Service: Cardiovascular;  Laterality: Left;  . A/V FISTULAGRAM Left 09/22/2019   Procedure: A/V FISTULAGRAM;  Surgeon: Marty Heck, MD;  Location: [facility];  Service: Cardiovascular;  Laterality: Left;  . ABDOMINAL SURGERY    . APPENDECTOMY    . AV FISTULA PLACEMENT Right 03/11/2018  Procedure: CREATION Brachiocephalic Fistula RIGHT ARM;  Surgeon: Rosetta Posner, MD;  Location: [facility];  Service: Vascular;  Laterality: Right;  . AV FISTULA PLACEMENT Left 05/06/2019   Procedure: LEFT BRACHIOCEPHALIC ARTERIOVENOUS (AV) FISTULA CREATION;  Surgeon: Marty Heck, MD;  Location: [facility];  Service: Vascular;  Laterality: Left;  . BACK SURGERY     x3; cages in patient's back since 2000  . BIOPSY  11/12/2018   Procedure: BIOPSY;  Surgeon: Laurence Spates, MD;  Location: [facility];  Service: Endoscopy;;  . CARDIAC CATHETERIZATION     stent   . CHOLECYSTECTOMY    . CLOSED REDUCTION SHOULDER DISLOCATION    . COLONOSCOPY W/ BIOPSIES AND POLYPECTOMY    . COLONOSCOPY WITH PROPOFOL N/A 11/12/2018   Procedure: COLONOSCOPY WITH PROPOFOL;  Surgeon: Laurence Spates, MD;  Location: [facility];  Service: Endoscopy;  Laterality: N/A;  . CORONARY ANGIOPLASTY    . diabetic ulcers    . DIAGNOSTIC LAPAROSCOPY    . DIALYSIS/PERMA CATHETER REMOVAL N/A 09/22/2019   Procedure: DIALYSIS/PERMA CATHETER REMOVAL;  Surgeon: Marty Heck, MD;  Location: [facility];  Service: Cardiovascular;  Laterality: N/A;  . ESOPHAGOGASTRODUODENOSCOPY (EGD) WITH PROPOFOL N/A 11/12/2018   Procedure: ESOPHAGOGASTRODUODENOSCOPY (EGD) WITH PROPOFOL;  Surgeon: Laurence Spates, MD;  Location: [facility];  Service: Endoscopy;  Laterality: N/A;  . GROIN EXPLORATION    . HEMOSTASIS CLIP PLACEMENT  11/12/2018   Procedure: HEMOSTASIS CLIP PLACEMENT;  Surgeon: Laurence Spates, MD;  Location: [facility];  Service: Endoscopy;;  . INSERT / REPLACE / REMOVE PACEMAKER    . INSERTION OF ARTERIOVENOUS (AV) ARTEGRAFT ARM Left 03/18/2018   Procedure: INSERTION OF ARTERIOVENOUS (AV) FISTULA;  Surgeon: Rosetta Posner, MD;  Location: [facility];  Service: Vascular;  Laterality: Left;  . IR FLUORO GUIDE CV LINE RIGHT  04/11/2019  . IR US GUIDE VASC ACCESS RIGHT  04/11/2019  . LEFT HEART CATH AND CORONARY ANGIOGRAPHY N/A 07/23/2019   Procedure: LEFT HEART CATH AND CORONARY ANGIOGRAPHY;  Surgeon: Martinique, Peter M, MD;  Location: [facility];  Service: Cardiovascular;  Laterality: N/A;  . LIGATION ARTERIOVENOUS GORTEX GRAFT Right 03/18/2018   Procedure: LIGATION ARTERIOVENOUS FISTULA;  Surgeon: Rosetta Posner, MD;  Location: [facility];  Service: Vascular;  Laterality: Right;  . OSTECTOMY Left 04/23/2017   Procedure: OSTECTOMY LEFT GREAT TOE;  Surgeon: [name], DPM;  Location: [facility];  Service: Podiatry;  Laterality: Left;  left great toe  . POLYPECTOMY  11/12/2018   Procedure:  POLYPECTOMY;  Surgeon: Laurence Spates, MD;  Location: [facility];  Service: Endoscopy;;  . TUMOR REMOVAL     small intestine x3  . VISCERAL ANGIOGRAPHY N/A 08/02/2019   Procedure: VISCERAL ANGIOGRAPHY;  Surgeon: Waynetta Sandy, MD;  Location: [facility];  Service: Cardiovascular;  Laterality: N/A;   Social History:  reports that he quit smoking about 10 months ago. His smoking use included cigarettes. He started smoking about 52 years ago. He has a 40.00 pack-year smoking history. He has never used smokeless tobacco. He reports previous alcohol use. He reports that he does not use drugs. Patient lives at home  No Known Allergies  Family History  Problem Relation Age of Onset  . Diabetes Mother   . Alcohol abuse Father       Prior to Admission medications   Medication Sig Start Date End Date Taking? Authorizing Provider  albuterol (VENTOLIN HFA) 108 (90 Base) MCG/ACT inhaler Inhale 2 puffs into the lungs every  6 (six) hours as needed for wheezing or shortness of breath.     [provider]  aspirin EC 81 MG EC tablet Take 1 tablet (81 mg total) by mouth daily. 07/25/19   Isaac Bliss, Rayford Halsted, MD  atorvastatin (LIPITOR) 40 MG tablet Take 1 tablet by mouth once daily 08/24/20   Arnoldo Lenis, MD  beclomethasone (QVAR) 80 MCG/ACT inhaler Inhale 2 puffs into the lungs 2 (two) times daily as needed (shortness of breath).    [provider]  bumetanide (BUMEX) 2 MG tablet Take 2 mg by mouth 2 (two) times daily. 07/15/19   [provider]  buPROPion (WELLBUTRIN SR) 150 MG 12 hr tablet Take 150 mg by mouth 2 (two) times daily.    [provider]  carvedilol (COREG) 25 MG tablet Take 1 tablet (25 mg total) by mouth See admin instructions. Take 25 mg twice daily on Sun, Mon, Wed, and Fri. Take 25 mg in the evening on Tues, Thurs, and Sat (dialysis days) 11/23/19   Arnoldo Lenis, MD  Cholecalciferol (VITAMIN D3) 2000 units TABS Take 2,000  Units by mouth daily.     [provider]  denosumab (PROLIA) 60 MG/ML SOSY injection Inject 60 mg into the skin every 6 (six) months.    [provider]  Docusate Sodium 50 MG/15ML LIQD 2 capsule as needed    [provider]  lactulose (CHRONULAC) 10 GM/15ML solution Take 20 g by mouth 2 (two) times daily as needed for mild constipation.    [provider]  linaclotide (LINZESS) 290 MCG CAPS capsule Take 290 mcg by mouth daily as needed (constipation).    [provider]  magnesium oxide (MAG-OX) 400 MG tablet Take 400 mg by mouth daily.    [provider]  Melatonin 10 MG TABS Take 10 mg by mouth at bedtime as needed (sleep).     [provider]  multivitamin (RENA-VIT) TABS tablet Take 1 tablet by mouth daily.    [provider]  nitrofurantoin (MACRODANTIN) 50 MG capsule Take 1 capsule (50 mg total) by mouth at bedtime. 06/16/20   McKenzie, Candee Furbish, MD  nitrofurantoin, macrocrystal-monohydrate, (MACROBID) 100 MG capsule Take 1 capsule (100 mg total) by mouth every 12 (twelve) hours. Patient not taking: Reported on 09/25/2020 08/25/20   Cleon Gustin, MD  nitrofurantoin, macrocrystal-monohydrate, (MACROBID) 100 MG capsule Take 1 capsule (100 mg total) by mouth at bedtime. 09/15/20   McKenzie, Candee Furbish, MD  nitrofurantoin, macrocrystal-monohydrate, (MACROBID) 100 MG capsule Take 1 capsule (100 mg total) by mouth daily. 09/25/20   McKenzie, Candee Furbish, MD  nitroGLYCERIN (NITROSTAT) 0.4 MG SL tablet Place 1 tablet (0.4 mg total) under the tongue every 5 (five) minutes as needed for chest pain. 08/04/19 02/27/21  Arnoldo Lenis, MD  omeprazole (PRILOSEC) 20 MG capsule Take 20 mg by mouth daily.     [provider]  ondansetron (ZOFRAN) 4 MG tablet Take 1 tablet (4 mg total) by mouth every 8 (eight) hours as needed for nausea or vomiting. 10/06/20   McKenzie, Candee Furbish, MD  polyethylene glycol (MIRALAX / GLYCOLAX) packet  Take 17 g by mouth daily as needed for mild constipation. Mix in morning coffee    [provider]  risperiDONE (RISPERDAL) 0.5 MG tablet Take 0.5 mg by mouth at bedtime.    [provider]  ROPINIROLE HCL PO Take 0.25 mg by mouth at bedtime. 05/29/20   [provider]  Sennosides 25  MG TABS Take 50 mg by mouth daily as needed (constipation).     [provider]  sertraline (ZOLOFT) 50 MG tablet Take 1 tablet by mouth daily. 09/22/20   [provider]  sildenafil (VIAGRA) 100 MG tablet Take 100 mg by mouth daily. 05/29/20   [provider]  solifenacin (VESICARE) 5 MG tablet Take 1 tablet (5 mg total) by mouth daily. 07/12/20   McKenzie, Candee Furbish, MD  tadalafil (CIALIS) 20 MG tablet Take 1 tablet (20 mg total) by mouth daily as needed. 06/16/20   McKenzie, Candee Furbish, MD  tamsulosin (FLOMAX) 0.4 MG CAPS capsule Take 1 capsule (0.4 mg total) by mouth daily after supper. 03/17/20   McKenzie, Candee Furbish, MD  tiZANidine (ZANAFLEX) 2 MG tablet Take 2 mg by mouth at bedtime. 03/06/20   [provider]  traMADol (ULTRAM) 50 MG tablet Take by mouth every 6 (six) hours as needed.    [provider]    Physical Exam: BP (!) 155/105 (BP Location: Right Arm)   Pulse 62   Temp (!) 97.3 F (36.3 C) (Oral)   Resp 19   Ht 6\' 4"  (1.93 m)   Wt 94.3 kg   SpO2 94%   BMI 25.32 kg/m   . General: Elderly male. Awake and alert and oriented x3. No acute cardiopulmonary distress.  Marland Kitchen HEENT: Normocephalic atraumatic.  Right and left ears normal in appearance.  Pupils equal, round, reactive to light. Extraocular muscles are intact. Sclerae anicteric and noninjected.  Moist mucosal membranes. No mucosal lesions.  . Neck: Neck supple without lymphadenopathy. No carotid bruits. No masses palpated.  . Cardiovascular: Regular rate with normal S1-S2 sounds. No murmurs, rubs, gallops auscultated. No JVD.  Marland Kitchen Respiratory: Good respiratory effort with no  wheezes, rales, rhonchi. Lungs clear to auscultation bilaterally.  No accessory muscle use. . Abdomen: Tenderness in the right lower quadrant.  No rebound, but guarding. Active bowel sounds. No masses or hepatosplenomegaly  . Skin: No rashes, lesions, or ulcerations.  Dry, warm to touch. 2+ dorsalis pedis and radial pulses. . Musculoskeletal: No calf or leg pain. All major joints not erythematous nontender.  No upper or lower joint deformation.  Good ROM.  No contractures  . Psychiatric: Intact judgment and insight. Pleasant and cooperative. . Neurologic: No focal neurological deficits. Strength is 5/5 and symmetric in upper and lower extremities.  Cranial nerves II through XII are grossly intact.           Labs on Admission: I have personally reviewed following labs and imaging studies  CBC: Recent Labs  Lab 10/08/20 1725  WBC 7.5  NEUTROABS 4.5  HGB 14.4  HCT 45.1  MCV 111.6*  PLT 431   Basic Metabolic Panel: Recent Labs  Lab 10/08/20 1725  NA 132*  K 3.7  CL 89*  CO2 33*  GLUCOSE 91  BUN 50*  CREATININE 6.48*  CALCIUM 10.6*   GFR: Estimated Creatinine Clearance: 13.6 mL/min (A) (by C-G formula based on SCr of 6.48 mg/dL (H)). Liver Function Tests: Recent Labs  Lab 10/08/20 1725  AST 58*  ALT 68*  ALKPHOS 69  BILITOT 0.7  PROT 6.8  ALBUMIN 3.0*   No results for input(s): LIPASE, AMYLASE in the last 168 hours. No results for input(s): AMMONIA in the last 168 hours. Coagulation Profile: No results for input(s): INR, PROTIME in the last 168 hours. Cardiac Enzymes: No results for input(s): CKTOTAL, CKMB, CKMBINDEX, TROPONINI in the last 168 hours. BNP (last 3  results) No results for input(s): PROBNP in the last 8760 hours. HbA1C: No results for input(s): HGBA1C in the last 72 hours. CBG: No results for input(s): GLUCAP in the last 168 hours. Lipid Profile: No results for input(s): CHOL, HDL, LDLCALC, TRIG, CHOLHDL, LDLDIRECT in the last 72 hours. Thyroid  Function Tests: No results for input(s): TSH, T4TOTAL, FREET4, T3FREE, THYROIDAB in the last 72 hours. Anemia Panel: No results for input(s): VITAMINB12, FOLATE, FERRITIN, TIBC, IRON, RETICCTPCT in the last 72 hours. Urine analysis:    Component Value Date/Time   COLORURINE YELLOW 07/22/2019 1625   APPEARANCEUR Hazy (A) 09/25/2020 1149   LABSPEC 1.010 07/22/2019 1625   PHURINE 6.0 07/22/2019 1625   GLUCOSEU Negative 09/25/2020 1149   HGBUR NEGATIVE 07/22/2019 1625   BILIRUBINUR Negative 09/25/2020 1149   KETONESUR NEGATIVE 07/22/2019 1625   PROTEINUR 2+ (A) 09/25/2020 1149   PROTEINUR >=300 (A) 07/22/2019 1625   UROBILINOGEN 0.2 09/27/2014 0027   NITRITE Negative 09/25/2020 1149   NITRITE NEGATIVE 07/22/2019 1625   LEUKOCYTESUR 1+ (A) 09/25/2020 1149   LEUKOCYTESUR NEGATIVE 07/22/2019 1625   Sepsis Labs: @LABRCNTIP (procalcitonin:4,lacticidven:4) )No results found for this or any previous visit (from the past 240 hour(s)).   Radiological Exams on Admission: CT ABDOMEN PELVIS WO CONTRAST  Result Date: 10/08/2020 CLINICAL DATA:  Acute abdominal pain. Nausea and vomiting. Patient reports right lower abdominal pain for 3 days. EXAM: CT ABDOMEN AND PELVIS WITHOUT CONTRAST TECHNIQUE: Multidetector CT imaging of the abdomen and pelvis was performed following the standard protocol without IV contrast. COMPARISON:  CT 07/28/2019 FINDINGS: Lower chest: Trace right pleural effusion with streaky and patchy right basilar airspace disease. Normal heart size. Coronary artery calcifications. Pacemaker partially visualized. No pericardial effusion. Hepatobiliary: No evidence of focal liver abnormality. Slight nodular contours again seen. Cholecystectomy. No biliary dilatation. Pancreas: Parenchymal atrophy. No ductal dilatation or inflammation. Spleen: Normal in size without focal abnormality. Adrenals/Urinary Tract: Minimal left adrenal thickening. No adrenal nodule bilaterally. Bilateral renal  parenchymal thinning. No hydronephrosis. Vascular calcifications, no renal calculi. Exophytic 14 mm slightly hyperdense lesion from the upper right kidney is not significantly changed from prior. Partially distended urinary bladder. Stomach/Bowel: Mild distal esophageal wall thickening. Unremarkable unenhanced stomach. Occasional fluid-filled small bowel without obstruction or inflammation. Appendix not visualized, appendectomy per history. Mild wall thickening of the ascending colon. Mixed liquid and solid stool in the transverse colon. Formed stool distally. No pericolonic inflammation. Vascular/Lymphatic: Advanced aortic and branch atherosclerosis. Infrarenal aortic aneurysm with maximal dimension 3.7 cm, unchanged. No periaortic stranding. Mild right common iliac aneurysmal dilatation at 2 cm. No bulky abdominopelvic adenopathy. Reproductive: Prostate is unremarkable. Other: Small volume abdominopelvic ascites. Peritoneal dialysis catheter enters right lower abdomen, courses into the pelvis with tip in the right lower quadrant. There is no adjacent fluid collection or inflammation. No free air. No abdominal wall hernia. Musculoskeletal: L2 compression fracture with vertebral augmentation, stable from October 2021 lumbar spine CT. Postsurgical change in the lower lumbar spine. Bones diffusely under mineralized. Surgical hardware in the left proximal femur. There are no acute or suspicious osseous abnormalities. IMPRESSION: 1. Mild wall thickening of the ascending colon, can be seen with colitis. 2. Peritoneal dialysis catheter with tip in the right lower quadrant. No adjacent fluid collection or inflammation. Small volume abdominopelvic ascites. 3. Trace right pleural effusion with streaky and patchy right basilar airspace disease, favor atelectasis over pneumonia. 4. Nodular hepatic contours suggesting cirrhosis. Small volume abdominopelvic ascites. 5. Stable infrarenal aortic aneurysm, maximal dimension 3.7 cm.  Recommend  follow-up every 2 years. This recommendation follows ACR consensus guidelines: White Paper of the ACR Incidental Findings Committee II on Vascular Findings. J Am Coll Radiol 2013; 10:789-794. Aortic Atherosclerosis (ICD10-I70.0). Electronically Signed   By: Keith Rake M.D.   On: 10/08/2020 17:36    Assessment/Plan: Active Problems:   COPD (chronic obstructive pulmonary disease) (HCC)   Diabetes mellitus with renal manifestations, uncontrolled (HCC)   Benign essential HTN   CAD (coronary artery disease)   Diabetes mellitus with nephropathy (HCC)   Chronic diastolic CHF (congestive heart failure) (HCC)   ESRD (end stage renal disease) ([place])   History of non-ST elevation myocardial infarction (NSTEMI)   Benign prostatic hyperplasia with urinary obstruction   Colitis    This patient was discussed with the ED physician, including pertinent vitals, physical exam findings, labs, and imaging.  We also discussed care given by the ED provider.  1. Colitis a. Admit b. Cipro and Flagyl c. CBC in the morning d. antiEmetics e. Clear liquid diet 2. Diabetes a. CBG and sliding scale insulin every 6 hours 3. End-stage renal disease on peritoneal dialysis a. Nephrology consulted for dialysis which she will get tomorrow 4. Coronary artery disease with history of NSTEMI, hypertension a. Continue home regimen 5. BPH a. Flomax 6. COPD a. Stable b. Continue home regimen  DVT prophylaxis: Lovenox Consultants: Nephrology Code Status: Full code Family Communication: Wife present during interview Disposition Plan: Patient should be able to return home following improvement   Truett Mainland, DO

## 2020-10-08 NOTE — Progress Notes (Signed)
Pharmacy Antibiotic Note  [name] is a 70 y.o. male admitted on 10/08/2020 with IAI.  Pharmacy has been consulted for Cipro dosing.  Plan: Cipro 400mg  IV now x 1 Additional orders to follow upon admission  Height: 6\' 4"  (193 cm) Weight: 94.3 kg (208 lb) IBW/kg (Calculated) : 86.8  Temp (24hrs), Avg:97.3 F (36.3 C), Min:97.3 F (36.3 C), Max:97.3 F (36.3 C)  Recent Labs  Lab 10/08/20 1725 10/08/20 1934  WBC 7.5  --   CREATININE 6.48*  --   LATICACIDVEN  --  1.3    Estimated Creatinine Clearance: 13.6 mL/min (A) (by C-G formula based on SCr of 6.48 mg/dL (H)).    No Known Allergies  Antimicrobials this admission:   >>    >>   Dose adjustments this admission:   Microbiology results:  BCx:   UCx:    Sputum:    MRSA PCR:   Thank you for allowing pharmacy to be a part of this patient's care.  Hart Robinsons A 10/08/2020 9:36 PM

## 2020-10-09 DIAGNOSIS — I132 Hypertensive heart and chronic kidney disease with heart failure and with stage 5 chronic kidney disease, or end stage renal disease: Secondary | ICD-10-CM | POA: Diagnosis present

## 2020-10-09 DIAGNOSIS — I714 Abdominal aortic aneurysm, without rupture: Secondary | ICD-10-CM | POA: Diagnosis present

## 2020-10-09 DIAGNOSIS — K5900 Constipation, unspecified: Secondary | ICD-10-CM | POA: Diagnosis present

## 2020-10-09 DIAGNOSIS — K7460 Unspecified cirrhosis of liver: Secondary | ICD-10-CM | POA: Diagnosis present

## 2020-10-09 DIAGNOSIS — N186 End stage renal disease: Secondary | ICD-10-CM | POA: Diagnosis present

## 2020-10-09 DIAGNOSIS — E1121 Type 2 diabetes mellitus with diabetic nephropathy: Secondary | ICD-10-CM | POA: Diagnosis not present

## 2020-10-09 DIAGNOSIS — E871 Hypo-osmolality and hyponatremia: Secondary | ICD-10-CM | POA: Diagnosis present

## 2020-10-09 DIAGNOSIS — I4891 Unspecified atrial fibrillation: Secondary | ICD-10-CM | POA: Diagnosis present

## 2020-10-09 DIAGNOSIS — I5032 Chronic diastolic (congestive) heart failure: Secondary | ICD-10-CM | POA: Diagnosis present

## 2020-10-09 DIAGNOSIS — N401 Enlarged prostate with lower urinary tract symptoms: Secondary | ICD-10-CM | POA: Diagnosis present

## 2020-10-09 DIAGNOSIS — I10 Essential (primary) hypertension: Secondary | ICD-10-CM | POA: Diagnosis not present

## 2020-10-09 DIAGNOSIS — K529 Noninfective gastroenteritis and colitis, unspecified: Secondary | ICD-10-CM | POA: Diagnosis present

## 2020-10-09 DIAGNOSIS — E1122 Type 2 diabetes mellitus with diabetic chronic kidney disease: Secondary | ICD-10-CM | POA: Diagnosis present

## 2020-10-09 DIAGNOSIS — N138 Other obstructive and reflux uropathy: Secondary | ICD-10-CM | POA: Diagnosis present

## 2020-10-09 DIAGNOSIS — I2510 Atherosclerotic heart disease of native coronary artery without angina pectoris: Secondary | ICD-10-CM | POA: Diagnosis present

## 2020-10-09 DIAGNOSIS — G4733 Obstructive sleep apnea (adult) (pediatric): Secondary | ICD-10-CM | POA: Diagnosis present

## 2020-10-09 DIAGNOSIS — I719 Aortic aneurysm of unspecified site, without rupture: Secondary | ICD-10-CM | POA: Diagnosis present

## 2020-10-09 DIAGNOSIS — J438 Other emphysema: Secondary | ICD-10-CM

## 2020-10-09 DIAGNOSIS — K760 Fatty (change of) liver, not elsewhere classified: Secondary | ICD-10-CM | POA: Diagnosis present

## 2020-10-09 DIAGNOSIS — F32A Depression, unspecified: Secondary | ICD-10-CM | POA: Diagnosis present

## 2020-10-09 DIAGNOSIS — E1151 Type 2 diabetes mellitus with diabetic peripheral angiopathy without gangrene: Secondary | ICD-10-CM | POA: Diagnosis present

## 2020-10-09 DIAGNOSIS — I700 Atherosclerosis of aorta: Secondary | ICD-10-CM | POA: Diagnosis present

## 2020-10-09 DIAGNOSIS — E785 Hyperlipidemia, unspecified: Secondary | ICD-10-CM | POA: Diagnosis present

## 2020-10-09 DIAGNOSIS — J449 Chronic obstructive pulmonary disease, unspecified: Secondary | ICD-10-CM | POA: Diagnosis present

## 2020-10-09 DIAGNOSIS — Z992 Dependence on renal dialysis: Secondary | ICD-10-CM | POA: Diagnosis not present

## 2020-10-09 DIAGNOSIS — K5730 Diverticulosis of large intestine without perforation or abscess without bleeding: Secondary | ICD-10-CM | POA: Diagnosis present

## 2020-10-09 DIAGNOSIS — I252 Old myocardial infarction: Secondary | ICD-10-CM | POA: Diagnosis not present

## 2020-10-09 DIAGNOSIS — Z20822 Contact with and (suspected) exposure to covid-19: Secondary | ICD-10-CM | POA: Diagnosis present

## 2020-10-09 LAB — GLUCOSE, CAPILLARY
Glucose-Capillary: 63 mg/dL — ABNORMAL LOW (ref 70–99)
Glucose-Capillary: 88 mg/dL (ref 70–99)
Glucose-Capillary: 67 mg/dL — ABNORMAL LOW (ref 70–99)
Glucose-Capillary: 74 mg/dL (ref 70–99)

## 2020-10-09 LAB — HEMOGLOBIN A1C
Hgb A1c MFr Bld: 5.6 % (ref 4.8–5.6)
Mean Plasma Glucose: 114.02 mg/dL

## 2020-10-09 LAB — CBC
HCT: 39.1 % (ref 39.0–52.0)
Hemoglobin: 12.9 g/dL — ABNORMAL LOW (ref 13.0–17.0)
MCH: 36.3 pg — ABNORMAL HIGH (ref 26.0–34.0)
MCHC: 33 g/dL (ref 30.0–36.0)
MCV: 110.1 fL — ABNORMAL HIGH (ref 80.0–100.0)
Platelets: 153 10*3/uL (ref 150–400)
RBC: 3.55 MIL/uL — ABNORMAL LOW (ref 4.22–5.81)
RDW: 14 % (ref 11.5–15.5)
WBC: 7.4 10*3/uL (ref 4.0–10.5)
nRBC: 0 % (ref 0.0–0.2)

## 2020-10-09 LAB — SARS CORONAVIRUS 2 (TAT 6-24 HRS): SARS Coronavirus 2: NEGATIVE

## 2020-10-09 LAB — BODY FLUID CELL COUNT WITH DIFFERENTIAL
Eos, Fluid: 0 %
Lymphs, Fluid: 23 %
Monocyte-Macrophage-Serous Fluid: 75 % (ref 50–90)
Neutrophil Count, Fluid: 2 % (ref 0–25)
Total Nucleated Cell Count, Fluid: 44 cu mm (ref 0–1000)

## 2020-10-09 LAB — HIV ANTIBODY (ROUTINE TESTING W REFLEX): HIV Screen 4th Generation wRfx: NONREACTIVE

## 2020-10-09 MED ORDER — HYDROMORPHONE HCL 2 MG PO TABS: 1.0000 mg | ORAL_TABLET | ORAL | Status: DC | PRN

## 2020-10-09 MED ORDER — HEPARIN 1000 UNIT/ML FOR PERITONEAL DIALYSIS: 500.0000 [IU] | INTRAMUSCULAR | Status: DC | PRN

## 2020-10-09 MED ORDER — HEPARIN 1000 UNIT/ML FOR PERITONEAL DIALYSIS
INTRAPERITONEAL | Status: DC | PRN
  Filled 2020-10-09: qty 5000

## 2020-10-09 MED ORDER — ASPIRIN EC 81 MG PO TBEC
81.0000 mg | DELAYED_RELEASE_TABLET | Freq: Every day | ORAL | Status: DC
  Administered 2020-10-10 – 2020-10-13 (×4): 81 mg via ORAL
  Filled 2020-10-09 (×4): qty 1

## 2020-10-09 MED ORDER — GENTAMICIN SULFATE 0.1 % EX CREA
1.0000 "application " | TOPICAL_CREAM | Freq: Every day | CUTANEOUS | Status: DC
  Administered 2020-10-10 – 2020-10-13 (×7): 1 via TOPICAL
  Filled 2020-10-09: qty 15

## 2020-10-09 MED ORDER — FENTANYL CITRATE (PF) 100 MCG/2ML IJ SOLN
25.0000 ug | INTRAMUSCULAR | Status: DC | PRN
  Administered 2020-10-09: 25 ug via INTRAVENOUS
  Filled 2020-10-09: qty 2

## 2020-10-09 MED ORDER — SODIUM CHLORIDE 0.9 % IV SOLN
2.0000 g | INTRAVENOUS | Status: DC
  Administered 2020-10-09 – 2020-10-12 (×4): 2 g via INTRAVENOUS
  Filled 2020-10-09: qty 20
  Filled 2020-10-09: qty 2
  Filled 2020-10-09 (×2): qty 20
  Filled 2020-10-09: qty 2

## 2020-10-09 MED ORDER — DELFLEX-LC/1.5% DEXTROSE 344 MOSM/L IP SOLN: INTRAPERITONEAL | Status: DC

## 2020-10-09 MED ORDER — CHLORHEXIDINE GLUCONATE CLOTH 2 % EX PADS
6.0000 | MEDICATED_PAD | Freq: Every day | CUTANEOUS | Status: DC
  Administered 2020-10-09 – 2020-10-12 (×3): 6 via TOPICAL

## 2020-10-09 NOTE — Plan of Care (Signed)

## 2020-10-09 NOTE — Consult Note (Signed)
[facility] Renal Consultation Note  Requesting MD: Emokpae Indication for Consultation: ESRD on PD  HPI:  [name] is a 70 y.o. male with DM, COPD, CAD as well as ESRD-  Currently on PD but does have a patent AVF.  He lives in [place]- follows with Candiss Norse with [facility].  He also may have a psych history on chronic risperdal-  Wife reports depression.  He presented yesterday to medical attention with right sided abdominal pain that was severe in nature-  Also nausea with inability to keep meds and food down.  He denies any cloudy PD fluid but it should be noted that patient seems slightly confused today-  According to wife he is usually very sharp-  Concern that it is the pain meds that are causing some confusion.  He had a CT that showed mild wall thickening fo ascending colon- the working diagnosis is colitis and he was started on cipro and flagyl.   He tells me this AM that his abdominal pain is much better.  Apparently , the patient and his wife were not told that we cannot do PD here at [facility].  As above-  He appears slightly confused today-  Could not enter his passcode to get access to his phone to call his wife-  Has been on dilaudid  Creat  Date/Time Value Ref Range Status  07/28/2015 04:06 PM 2.01 (H) 0.70 - 1.25 mg/dL Final   Creatinine, Ser  Date/Time Value Ref Range Status  10/08/2020 05:25 PM 6.48 (H) 0.61 - 1.24 mg/dL Final  09/21/2020 11:16 AM 7.23 (H) 0.61 - 1.24 mg/dL Final  11/23/2019 12:23 PM 4.72 (H) 0.61 - 1.24 mg/dL Final  11/17/2019 05:55 PM 7.01 (H) 0.61 - 1.24 mg/dL Final  09/22/2019 09:11 AM 6.60 (H) 0.61 - 1.24 mg/dL Final  08/02/2019 12:11 PM 7.90 (H) 0.61 - 1.24 mg/dL Final  07/24/2019 07:56 PM 4.91 (H) 0.61 - 1.24 mg/dL Final    Comment:    DELTA CHECK NOTED DIALYSIS   07/24/2019 05:27 AM 8.20 (H) 0.61 - 1.24 mg/dL Final  07/23/2019 05:57 AM 6.77 (H) 0.61 - 1.24 mg/dL Final  07/22/2019 01:43 PM 5.32 (H) 0.61 - 1.24 mg/dL Final   05/06/2019 08:09 AM 4.50 (H) 0.61 - 1.24 mg/dL Final  04/30/2019 08:08 AM 4.80 (H) 0.61 - 1.24 mg/dL Final  04/12/2019 06:14 AM 5.02 (H) 0.61 - 1.24 mg/dL Final  04/11/2019 08:28 PM 4.58 (H) 0.61 - 1.24 mg/dL Final  04/11/2019 01:07 PM 5.74 (H) 0.61 - 1.24 mg/dL Final  04/10/2019 08:34 PM 6.06 (H) 0.61 - 1.24 mg/dL Final  04/10/2019 12:44 PM 6.33 (H) 0.61 - 1.24 mg/dL Final  04/09/2019 10:03 AM 6.30 (H) 0.61 - 1.24 mg/dL Final  02/23/2019 11:13 AM 5.31 (H) 0.61 - 1.24 mg/dL Final  01/19/2019 12:13 PM 5.68 (H) 0.61 - 1.24 mg/dL Final  01/05/2019 11:51 AM 4.50 (H) 0.61 - 1.24 mg/dL Final  12/22/2018 10:43 AM 6.02 (H) 0.61 - 1.24 mg/dL Final  11/24/2018 11:15 AM 5.60 (H) 0.61 - 1.24 mg/dL Final  10/20/2018 11:53 AM 4.49 (H) 0.61 - 1.24 mg/dL Final  10/14/2018 02:56 PM 4.53 (H) 0.61 - 1.24 mg/dL Final  09/08/2018 11:26 AM 4.80 (H) 0.61 - 1.24 mg/dL Final  03/19/2018 12:13 AM 3.73 (H) 0.61 - 1.24 mg/dL Final  03/18/2018 05:45 PM 3.85 (H) 0.61 - 1.24 mg/dL Final  08/06/2017 02:13 PM 3.23 (H) 0.76 - 1.27 mg/dL Final  05/10/2017 06:02 AM 3.12 (H) 0.61 - 1.24 mg/dL Final  05/09/2017 12:27 PM 3.24 (H) 0.61 - 1.24 mg/dL Final  04/14/2017 01:59 PM 2.60 (H) 0.61 - 1.24 mg/dL Final  04/08/2016 06:14 AM 2.11 (H) 0.61 - 1.24 mg/dL Final  04/07/2016 06:18 AM 2.17 (H) 0.61 - 1.24 mg/dL Final  04/06/2016 06:37 AM 2.19 (H) 0.61 - 1.24 mg/dL Final  04/05/2016 06:24 AM 2.33 (H) 0.61 - 1.24 mg/dL Final  04/04/2016 12:53 PM 2.59 (H) 0.61 - 1.24 mg/dL Final  08/07/2015 09:35 AM 1.81 (H) 0.76 - 1.27 mg/dL Final  09/28/2014 04:15 AM 1.86 (H) 0.61 - 1.24 mg/dL Final  09/27/2014 05:58 AM 2.02 (H) 0.61 - 1.24 mg/dL Final  09/26/2014 09:43 PM 2.26 (H) 0.61 - 1.24 mg/dL Final     PMHx:   Past Medical History:  Diagnosis Date  . AAA (abdominal aortic aneurysm) ([place]) 06/2016   Mild increase in size of 3.4 cm infrarenal abdominal aortic aneurysm  . Anemia   . Aortic atherosclerosis ([place])   . Asthma   .  Atrial flutter ([place])   . AVF (arteriovenous fistula) ([facility])    Left forearm  . CAD (coronary artery disease)    a. s/p prior stenting of RCA in 1999 b. cath in 2003 showing moderate CAD c. NST in 2016 showing small area of ischemic and low-risk  . CHF (congestive heart failure) ([place])   . CKD (chronic kidney disease), stage V ([place])    kidney transplant evaluation  . COPD (chronic obstructive pulmonary disease) ([place])    with ongoing tobacco use and patient failed sham takes  . Diverticulosis 2018   Mild sigmoid colon diverticulosis.  Marland Kitchen DM2 (diabetes mellitus, type 2) ([place])   . Dyslipidemia   . Dysrhythmia 2018   atrial fibrillation  . Edema    chronic lower extremity secondary to right heart failure and chronic venous insufficiency  . Fatigue   . Fatty liver 2010   Mild  . Headache   . HTN (hypertension)   . Morbid obesity ([place])   . Multiple pulmonary nodules 05/2018   Bilateral  . Pneumonia   . Presence of permanent cardiac pacemaker   . 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   Marland Kitchen Reflux esophagitis    hx  . Sleep apnea    PT STATES ONE TEST WAS POSITIVE AND ANOTHER TEST WAS NEGATIVE  . Two-vessel coronary artery disease    moderate. by cath in 2003. Status post stenting of the mdi RCA November 1999 normal left ventricular ejection fraction  . Wears glasses   . Wears hearing aid    B/L    Past Surgical History:  Procedure Laterality Date  . A/V FISTULAGRAM Left 04/30/2019   Procedure: A/V FISTULAGRAM;  Surgeon: Elam Dutch, MD;  Location: [facility];  Service: Cardiovascular;  Laterality: Left;  . A/V FISTULAGRAM Left 09/22/2019   Procedure: A/V FISTULAGRAM;  Surgeon: Marty Heck, MD;  Location: [facility];  Service: Cardiovascular;  Laterality: Left;  . ABDOMINAL SURGERY    . APPENDECTOMY    . AV FISTULA PLACEMENT Right 03/11/2018   Procedure: CREATION Brachiocephalic Fistula RIGHT  ARM;  Surgeon: Rosetta Posner, MD;  Location: [facility];  Service: Vascular;  Laterality: Right;  . AV FISTULA PLACEMENT Left 05/06/2019   Procedure: LEFT BRACHIOCEPHALIC ARTERIOVENOUS (AV) FISTULA CREATION;  Surgeon: Marty Heck, MD;  Location: [facility];  Service: Vascular;  Laterality: Left;  . BACK SURGERY     x3; cages  in patient's back since 2000  . BIOPSY  11/12/2018   Procedure: BIOPSY;  Surgeon: Laurence Spates, MD;  Location: [facility];  Service: Endoscopy;;  . CARDIAC CATHETERIZATION     stent  . CHOLECYSTECTOMY    . CLOSED REDUCTION SHOULDER DISLOCATION    . COLONOSCOPY W/ BIOPSIES AND POLYPECTOMY    . COLONOSCOPY WITH PROPOFOL N/A 11/12/2018   Procedure: COLONOSCOPY WITH PROPOFOL;  Surgeon: Laurence Spates, MD;  Location: [facility];  Service: Endoscopy;  Laterality: N/A;  . CORONARY ANGIOPLASTY    . diabetic ulcers    . DIAGNOSTIC LAPAROSCOPY    . DIALYSIS/PERMA CATHETER REMOVAL N/A 09/22/2019   Procedure: DIALYSIS/PERMA CATHETER REMOVAL;  Surgeon: Marty Heck, MD;  Location: [facility];  Service: Cardiovascular;  Laterality: N/A;  . ESOPHAGOGASTRODUODENOSCOPY (EGD) WITH PROPOFOL N/A 11/12/2018   Procedure: ESOPHAGOGASTRODUODENOSCOPY (EGD) WITH PROPOFOL;  Surgeon: Laurence Spates, MD;  Location: [facility];  Service: Endoscopy;  Laterality: N/A;  . GROIN EXPLORATION    . HEMOSTASIS CLIP PLACEMENT  11/12/2018   Procedure: HEMOSTASIS CLIP PLACEMENT;  Surgeon: Laurence Spates, MD;  Location: [facility];  Service: Endoscopy;;  . INSERT / REPLACE / REMOVE PACEMAKER    . INSERTION OF ARTERIOVENOUS (AV) ARTEGRAFT ARM Left 03/18/2018   Procedure: INSERTION OF ARTERIOVENOUS (AV) FISTULA;  Surgeon: Rosetta Posner, MD;  Location: [facility];  Service: Vascular;  Laterality: Left;  . IR FLUORO GUIDE CV LINE RIGHT  04/11/2019  . IR US GUIDE VASC ACCESS RIGHT  04/11/2019  . LEFT HEART CATH AND CORONARY ANGIOGRAPHY N/A 07/23/2019   Procedure: LEFT HEART CATH AND CORONARY  ANGIOGRAPHY;  Surgeon: Martinique, Peter M, MD;  Location: [facility];  Service: Cardiovascular;  Laterality: N/A;  . LIGATION ARTERIOVENOUS GORTEX GRAFT Right 03/18/2018   Procedure: LIGATION ARTERIOVENOUS FISTULA;  Surgeon: Rosetta Posner, MD;  Location: [facility];  Service: Vascular;  Laterality: Right;  . OSTECTOMY Left 04/23/2017   Procedure: OSTECTOMY LEFT GREAT TOE;  Surgeon: [name], DPM;  Location: [facility];  Service: Podiatry;  Laterality: Left;  left great toe  . POLYPECTOMY  11/12/2018   Procedure: POLYPECTOMY;  Surgeon: Laurence Spates, MD;  Location: [facility];  Service: Endoscopy;;  . TUMOR REMOVAL     small intestine x3  . VISCERAL ANGIOGRAPHY N/A 08/02/2019   Procedure: VISCERAL ANGIOGRAPHY;  Surgeon: Waynetta Sandy, MD;  Location: [facility];  Service: Cardiovascular;  Laterality: N/A;    Family Hx:  Family History  Problem Relation Age of Onset  . Diabetes Mother   . Alcohol abuse Father     Social History:  reports that he quit smoking about 10 months ago. His smoking use included cigarettes. He started smoking about 52 years ago. He has a 40.00 pack-year smoking history. He has never used smokeless tobacco. He reports previous alcohol use. He reports that he does not use drugs.  Allergies: No Known Allergies  Medications: Prior to Admission medications   Medication Sig Start Date End Date Taking? Authorizing Provider  albuterol (VENTOLIN HFA) 108 (90 Base) MCG/ACT inhaler Inhale 2 puffs into the lungs every 6 (six) hours as needed for wheezing or shortness of breath.    Yes [provider]  aspirin EC 81 MG EC tablet Take 1 tablet (81 mg total) by mouth daily. 07/25/19  Yes Isaac Bliss, Rayford Halsted, MD  atorvastatin (LIPITOR) 40 MG tablet Take 1 tablet by mouth once daily 08/24/20  Yes Branch, Alphonse Guild, MD  beclomethasone (QVAR)  80 MCG/ACT inhaler Inhale 2 puffs into the lungs 2 (two) times daily as needed (shortness of breath).    Yes [provider]  bumetanide (BUMEX) 2 MG tablet Take 2 mg by mouth 2 (two) times daily. 07/15/19  Yes [provider]  buPROPion (WELLBUTRIN SR) 150 MG 12 hr tablet Take 150 mg by mouth 2 (two) times daily.   Yes [provider]  carvedilol (COREG) 25 MG tablet Take 1 tablet (25 mg total) by mouth See admin instructions. Take 25 mg twice daily on Sun, Mon, Wed, and Fri. Take 25 mg in the evening on Tues, Thurs, and Sat (dialysis days) 11/23/19  Yes Branch, Alphonse Guild, MD  Cholecalciferol (VITAMIN D3) 2000 units TABS Take 2,000 Units by mouth daily.    Yes [provider]  denosumab (PROLIA) 60 MG/ML SOSY injection Inject 60 mg into the skin every 6 (six) months.   Yes [provider]  linaclotide (LINZESS) 290 MCG CAPS capsule Take 290 mcg by mouth daily as needed (constipation).   Yes [provider]  magnesium oxide (MAG-OX) 400 MG tablet Take 400 mg by mouth daily.   Yes [provider]  Melatonin 10 MG TABS Take 10 mg by mouth at bedtime as needed (sleep).    Yes [provider]  multivitamin (RENA-VIT) TABS tablet Take 1 tablet by mouth daily.   Yes [provider]  nitrofurantoin, macrocrystal-monohydrate, (MACROBID) 100 MG capsule Take 1 capsule (100 mg total) by mouth daily. 09/25/20  Yes McKenzie, Candee Furbish, MD  omeprazole (PRILOSEC) 20 MG capsule Take 20 mg by mouth daily.    Yes [provider]  ondansetron (ZOFRAN) 4 MG tablet Take 1 tablet (4 mg total) by mouth every 8 (eight) hours as needed for nausea or vomiting. 10/06/20  Yes McKenzie, Candee Furbish, MD  polyethylene glycol (MIRALAX / GLYCOLAX) packet Take 17 g by mouth daily as needed for mild constipation. Mix in morning coffee   Yes [provider]  Potassium Chloride ER 20 MEQ TBCR Take 1 tablet by mouth daily. 09/28/20  Yes [provider]  risperiDONE (RISPERDAL) 0.5 MG tablet Take 0.5 mg by mouth at bedtime.   Yes [provider]  rOPINIRole (REQUIP) 0.25 MG tablet Take 0.25 mg by mouth 2 (two) times daily. 09/28/20  Yes [provider]  Sennosides 25 MG TABS Take 50 mg by mouth daily as needed (constipation).    Yes [provider]  sertraline (ZOLOFT) 50 MG tablet Take 1 tablet by mouth daily. 09/22/20  Yes [provider]  solifenacin (VESICARE) 5 MG tablet Take 1 tablet (5 mg total) by mouth daily. 07/12/20  Yes McKenzie, Candee Furbish, MD  tadalafil (CIALIS) 20 MG tablet Take 1 tablet (20 mg total) by mouth daily as needed. 06/16/20  Yes McKenzie, Candee Furbish, MD  tamsulosin (FLOMAX) 0.4 MG CAPS capsule Take 1 capsule (0.4 mg total) by mouth daily after supper. 03/17/20  Yes McKenzie, Candee Furbish, MD  tiZANidine (ZANAFLEX) 2 MG tablet Take 2 mg by mouth at bedtime. 03/06/20  Yes [provider]  traMADol (ULTRAM) 50 MG tablet Take by mouth every 6 (six) hours as needed.   Yes [provider]  lactulose (CHRONULAC) 10 GM/15ML solution Take 20 g by mouth 2 (two) times daily as needed for mild constipation.    [provider]    I have reviewed the patient's current medications.  Labs:  Results for orders placed or performed during the hospital  encounter of 10/08/20 (from the past 48 hour(s))  CBC with Differential/Platelet     Status: Abnormal   Collection Time: 10/08/20  5:25 PM  Result Value Ref Range   WBC 7.5 4.0 - 10.5 K/uL   RBC 4.04 (L) 4.22 - 5.81 MIL/uL   Hemoglobin 14.4 13.0 - 17.0 g/dL   HCT 45.1 39.0 - 52.0 %   MCV 111.6 (H) 80.0 - 100.0 fL   MCH 35.6 (H) 26.0 - 34.0 pg   MCHC 31.9 30.0 - 36.0 g/dL   RDW 14.3 11.5 - 15.5 %   Platelets 160 150 - 400 K/uL   nRBC 0.0 0.0 - 0.2 %   Neutrophils Relative % 61 %   Neutro Abs 4.5 1.7 - 7.7 K/uL   Lymphocytes Relative 27 %   Lymphs Abs 2.0 0.7 - 4.0 K/uL   Monocytes Relative 10 %   Monocytes Absolute 0.7 0.1 - 1.0 K/uL   Eosinophils Relative 1 %   Eosinophils Absolute 0.1 0.0 - 0.5 K/uL    Basophils Relative 1 %   Basophils Absolute 0.1 0.0 - 0.1 K/uL   Immature Granulocytes 0 %   Abs Immature Granulocytes 0.03 0.00 - 0.07 K/uL    Comment: Performed at [facility], [address] [place], [place] [address]  Comprehensive metabolic panel     Status: Abnormal   Collection Time: 10/08/20  5:25 PM  Result Value Ref Range   Sodium 132 (L) 135 - 145 mmol/L   Potassium 3.7 3.5 - 5.1 mmol/L   Chloride 89 (L) 98 - 111 mmol/L   CO2 33 (H) 22 - 32 mmol/L   Glucose, Bld 91 70 - 99 mg/dL    Comment: Glucose reference range applies only to samples taken after fasting for at least 8 hours.   BUN 50 (H) 8 - 23 mg/dL   Creatinine, Ser 6.48 (H) 0.61 - 1.24 mg/dL   Calcium 10.6 (H) 8.9 - 10.3 mg/dL   Total Protein 6.8 6.5 - 8.1 g/dL   Albumin 3.0 (L) 3.5 - 5.0 g/dL   AST 58 (H) 15 - 41 U/L   ALT 68 (H) 0 - 44 U/L   Alkaline Phosphatase 69 38 - 126 U/L   Total Bilirubin 0.7 0.3 - 1.2 mg/dL   GFR, Estimated 9 (L) >60 mL/min    Comment: (NOTE) Calculated using the CKD-EPI Creatinine Equation (2021)    Anion gap 10 5 - 15    Comment: Performed at [facility], [address] [place], [place] [address]  Lactic acid, plasma     Status: None   Collection Time: 10/08/20  7:34 PM  Result Value Ref Range   Lactic Acid, Venous 1.3 0.5 - 1.9 mmol/L    Comment: Performed at [facility], [address] [place], [place] [address]  Glucose, capillary     Status: Abnormal   Collection Time: 10/08/20 10:56 PM  Result Value Ref Range   Glucose-Capillary 114 (H) 70 - 99 mg/dL    Comment: Glucose reference range applies only to samples taken after fasting for at least 8 hours.  CBC     Status: Abnormal   Collection Time: 10/09/20  4:33 AM  Result Value Ref Range   WBC 7.4 4.0 - 10.5 K/uL   RBC 3.55 (L) 4.22 - 5.81 MIL/uL   Hemoglobin 12.9 (L) 13.0 - 17.0 g/dL   HCT 39.1 39.0 - 52.0 %   MCV 110.1 (H) 80.0 - 100.0 fL   MCH 36.3 (H)  26.0 - 34.0 pg   MCHC 33.0 30.0 - 36.0 g/dL   RDW 14.0 11.5 -  15.5 %   Platelets 153 150 - 400 K/uL    Comment: SPECIMEN CHECKED FOR CLOTS PLATELET COUNT CONFIRMED BY SMEAR    nRBC 0.0 0.0 - 0.2 %    Comment: Performed at [facility], [address], [place], [place] [address]  Glucose, capillary     Status: Abnormal   Collection Time: 10/09/20  5:53 AM  Result Value Ref Range   Glucose-Capillary 63 (L) 70 - 99 mg/dL    Comment: Glucose reference range applies only to samples taken after fasting for at least 8 hours.  Glucose, capillary     Status: None   Collection Time: 10/09/20  6:51 AM  Result Value Ref Range   Glucose-Capillary 88 70 - 99 mg/dL    Comment: Glucose reference range applies only to samples taken after fasting for at least 8 hours.     ROS:  A comprehensive review of systems was negative except for: Gastrointestinal: positive for abdominal pain  Physical Exam: Vitals:   10/09/20 0555 10/09/20 0804  BP: 127/68   Pulse: 60   Resp: 18   Temp: 98.2 F (36.8 C)   SpO2: 97% 92%     General: alert, slightly confused-  Says is better HEENT: PERRLA, EOMI, mucous membranes moist Neck: no JVD Heart: RRR Lungs: mostly clear Abdomen: PD cath in right lower quad-  No tunnel tenderness-  Soft, minimal tenderness-  No rebound or guarding Extremities: no edema Skin: warm and dry Neuro: alert, slightly confused Left upper arm AVF-  Patent   Assessment/Plan: 70 year old WM with DM, CAD and ESRD-  Currently on PD-  Admitted for colitis  1.Renal- ESRD on PD.  Unfortunately we are not prepared to do PD here here at [facility].  We cannot even do a fluid sampling to see if has peritonitis.  This is not ideal.  The fact that he tells me that his fluid was clear and that he seems to be getting better is reassuring.   I offered the patient and his wife an HD treatment here today via his AVF.  Then if continues to improve from an abdominal pain standpoint and possibly to have a short hospital stay-  Could be discharged to his home PD place  to get a fluid sample for cell count and culture just to make sure and then he could resume home PD sooner rather than later.  However, if pt does not improve and we are curious about peritonitis or if hospitalization lasts longer than 48 -72 hours, would need to be transferred to [facility] to get fully evaluated and to continue with PD regimen 2. Hypertension/volume  - does not seem overloaded-  Minimal UF with HD today  3. Colitis-  Started on IV flagyl and cipro with seemingly good results-  WBC is normal  4. Anemia  - not an issue right now 5. Confusion-  Hopefully due to pain meds and will clear   Louis Meckel 10/09/2020, 10:16 AM

## 2020-10-09 NOTE — Progress Notes (Signed)
Patient Demographics:    [name], is a 70 y.o. male, DOB - 1950-09-16, OEU:[id number]  Admit date - 10/08/2020   Admitting Physician Albert Mainland, DO  Outpatient Primary MD for the patient is Pradhan, Tilden Fossa, MD  LOS - 0   Chief Complaint  Patient presents with  . Abdominal Pain        Subjective:    [name] today has no fevers, no emesis,  No chest pain,     Assessment  & Plan :    Active Problems:   COPD (chronic obstructive pulmonary disease) (HCC)   Diabetes mellitus with renal manifestations, uncontrolled (HCC)   Benign essential HTN   CAD (coronary artery disease)   Diabetes mellitus with nephropathy (HCC)   Chronic diastolic CHF (congestive heart failure) (HCC)   ESRD (end stage renal disease) (HCC)   History of non-ST elevation myocardial infarction (NSTEMI)   Benign prostatic hyperplasia with urinary obstruction   Colitis   Brief Summary:- 70 y.o. male with DM, COPD, CAD as well as ESRD-  Currently on PD but does have a patent AVF admitted on 10/08/20 with possible Colitis Vs SBP in a PD patient  A/p 1)Abd Pain and Emesis-- Colitis Vs SBP--soft diagnosis of possible colitis, this may be SBP rather than frank colitis No Diarrhea -Emesis has resolved for now -Continue Rocephin and Flagyl as above #1 -Stop Cipro Obtain and send Peritoneal Fluid sample for Gram Stain, cell Count and Culture  2)Possible SBP-- discussed with Dr Moshe Cipro (Nephrology)-----we are Not able to draw fluid from his PD catheter here at [facility] to R/o Infection----he Needs to go to [facility]   -Patient has only been on PD since March 2022 -We are unable to assess PD catheter for hemodialysis or even to obtain fluid samples at [facility] should patient be transferred to [place] and send Peritoneal Fluid sample for Gram Stain, cell Count and Culture -Continue Rocephin and  Flagyl as above #1 -Stop Cipro  3)Liver Cirrhosis--patient and wife denies EtOH intake, ? NASH, -Elevated LFTs noted -- check fasting lipid  profile pneumonia, check ammonia levels, may use lactulose  4)CAD/Prior NSTEMI---s/p prior angioplasty with stent-- s/p prior stenting of RCA in 1999 b. cath in 2003 showing moderate CAD -Chest pain-free, continue aspirin Coreg and Lipitor  5)ESRD--- on HD since 02/2019, started on PD in 07/2020 -Patient has a functional left upper extremity AV fistula which can be used in the meantime -Last PD session was 10/07/2020 -Hold Bumex due to poor oral intake with emesis and risk for dehydration  6)COPD--quit smoking 11/2019, no acute exacerbation, bronchodilators as ordered  7)DM2-A1c is 5.6 reflecting excellent diabetic control PTA Use Novolog/Humalog Sliding scale insulin with Accu-Cheks/Fingersticks as ordered   8)Chronic Back Pain/Osteoporosis- has CAGE in his back -Continue Enablex - At times he gets delirium with opiates at times- -Preferred narcotic agents for pain control are hydromorphone, fentanyl, and methadone. Morphine should not be used.  Baclofen should be avoided  9)Depression--- stable, continue Wellbutrin, Zoloft was added, patient takes risperidone nightly  10)Social/Ethics--- plan of care and advanced directives discussed with patient and wife, patient is a full code  Disposition/Need for in-Hospital Stay- patient unable to be discharged at this time  due to -colitis Versus SBP requiring IV antibiotics pending further culture data*  Status is: Inpatient  Remains inpatient appropriate because:See disposition above   Disposition: The patient is from: Home              Anticipated d/c is to: Home              Anticipated d/c date is: 2 days              Patient currently is not medically stable to d/c. Barriers: Not Clinically Stable-   Code Status :  -  Code Status: Full Code   Family Communication:   patient is alert, awake  and coherent Discussed with pt's wife  Consults  :  Nephrology  DVT Prophylaxis  :   - SCDs   enoxaparin (LOVENOX) injection 30 mg Start: 10/09/20 0600    Lab Results  Component Value Date   PLT 153 10/09/2020    Inpatient Medications  Scheduled Meds: . atorvastatin  40 mg Oral Daily  . budesonide (PULMICORT) nebulizer solution  0.25 mg Nebulization BID  . bumetanide  2 mg Oral BID  . buPROPion  150 mg Oral BID  . carvedilol  25 mg Oral 2 times per day on Sun Mon Wed Fri  . [START ON 10/10/2020] carvedilol  25 mg Oral Once per day on Tue Thu Sat  . Chlorhexidine Gluconate Cloth  6 each Topical Q0600  . darifenacin  7.5 mg Oral Daily  . enoxaparin (LOVENOX) injection  30 mg Subcutaneous Q24H  . insulin aspart  0-9 Units Subcutaneous Q6H  . magnesium oxide  400 mg Oral Daily  . multivitamin  1 tablet Oral Daily  . pantoprazole (PROTONIX) IV  40 mg Intravenous Q24H  . risperiDONE  0.5 mg Oral QHS  . rOPINIRole  0.25 mg Oral QHS  . sertraline  50 mg Oral Daily  . tamsulosin  0.4 mg Oral QPC supper   Continuous Infusions: . lactated ringers 100 mL/hr at 10/08/20 2345  . metronidazole 500 mg (10/09/20 0851)   PRN Meds:.albuterol, bisacodyl, HYDROmorphone (DILAUDID) injection, lactulose, linaclotide, promethazine, senna    Anti-infectives (From admission, onward)   Start     Dose/Rate Route Frequency Ordered Stop   10/08/20 2145  ciprofloxacin (CIPRO) IVPB 400 mg        400 mg 200 mL/hr over 60 Minutes Intravenous  Once 10/08/20 2134 10/09/20 0221   10/08/20 2030  metroNIDAZOLE (FLAGYL) IVPB 500 mg        500 mg 100 mL/hr over 60 Minutes Intravenous Every 8 hours 10/08/20 2027     10/08/20 1945  metroNIDAZOLE (FLAGYL) IVPB 500 mg  Status:  Discontinued        500 mg 100 mL/hr over 60 Minutes Intravenous  Once 10/08/20 1944 10/08/20 2027   10/08/20 1945  ciprofloxacin (CIPRO) IVPB 400 mg  Status:  Discontinued        400 mg 200 mL/hr over 60 Minutes Intravenous  Once  10/08/20 1944 10/08/20 2027        Objective:   Vitals:   10/09/20 0213 10/09/20 0555 10/09/20 0804 10/09/20 1018  BP: (!) 154/88 127/68  125/67  Pulse: 82 60  (!) 59  Resp: 18 18  18   Temp: 98.7 F (37.1 C) 98.2 F (36.8 C)  98.1 F (36.7 C)  TempSrc: Oral Oral  Oral  SpO2: 100% 97% 92% 96%  Weight:      Height:  Wt Readings from Last 3 Encounters:  10/08/20 94.2 kg  09/21/20 94.3 kg  09/01/20 94.5 kg     Intake/Output Summary (Last 24 hours) at 10/09/2020 1142 Last data filed at 10/09/2020 0600 Gross per 24 hour  Intake 1253.85 ml  Output --  Net 1253.85 ml   Physical Exam  Gen:- Awake Alert,  In no apparent distress  HEENT:- [place].AT, No sclera icterus Neck-Supple Neck,No JVD,.  Lungs-  CTAB , fair symmetrical air movement CV- S1, S2 normal, regular  Abd-  +ve B.Sounds, Abd Soft, PD catheter site and medial lower abdominal tenderness without rebound or guarding Extremity/Skin:- No  edema, pedal pulses present  Psych-affect is flat, oriented x3 Neuro-generalized weakness, no new focal deficits, no tremors MSK- Lt  upper extremity AV fistula positive thrill and bruit   Data Review:   Micro Results No results found for this or any previous visit (from the past 240 hour(s)).  Radiology Reports CT ABDOMEN PELVIS WO CONTRAST  Result Date: 10/08/2020 CLINICAL DATA:  Acute abdominal pain. Nausea and vomiting. Patient reports right lower abdominal pain for 3 days. EXAM: CT ABDOMEN AND PELVIS WITHOUT CONTRAST TECHNIQUE: Multidetector CT imaging of the abdomen and pelvis was performed following the standard protocol without IV contrast. COMPARISON:  CT 07/28/2019 FINDINGS: Lower chest: Trace right pleural effusion with streaky and patchy right basilar airspace disease. Normal heart size. Coronary artery calcifications. Pacemaker partially visualized. No pericardial effusion. Hepatobiliary: No evidence of focal liver abnormality. Slight nodular contours again seen.  Cholecystectomy. No biliary dilatation. Pancreas: Parenchymal atrophy. No ductal dilatation or inflammation. Spleen: Normal in size without focal abnormality. Adrenals/Urinary Tract: Minimal left adrenal thickening. No adrenal nodule bilaterally. Bilateral renal parenchymal thinning. No hydronephrosis. Vascular calcifications, no renal calculi. Exophytic 14 mm slightly hyperdense lesion from the upper right kidney is not significantly changed from prior. Partially distended urinary bladder. Stomach/Bowel: Mild distal esophageal wall thickening. Unremarkable unenhanced stomach. Occasional fluid-filled small bowel without obstruction or inflammation. Appendix not visualized, appendectomy per history. Mild wall thickening of the ascending colon. Mixed liquid and solid stool in the transverse colon. Formed stool distally. No pericolonic inflammation. Vascular/Lymphatic: Advanced aortic and branch atherosclerosis. Infrarenal aortic aneurysm with maximal dimension 3.7 cm, unchanged. No periaortic stranding. Mild right common iliac aneurysmal dilatation at 2 cm. No bulky abdominopelvic adenopathy. Reproductive: Prostate is unremarkable. Other: Small volume abdominopelvic ascites. Peritoneal dialysis catheter enters right lower abdomen, courses into the pelvis with tip in the right lower quadrant. There is no adjacent fluid collection or inflammation. No free air. No abdominal wall hernia. Musculoskeletal: L2 compression fracture with vertebral augmentation, stable from October 2021 lumbar spine CT. Postsurgical change in the lower lumbar spine. Bones diffusely under mineralized. Surgical hardware in the left proximal femur. There are no acute or suspicious osseous abnormalities. IMPRESSION: 1. Mild wall thickening of the ascending colon, can be seen with colitis. 2. Peritoneal dialysis catheter with tip in the right lower quadrant. No adjacent fluid collection or inflammation. Small volume abdominopelvic ascites. 3. Trace  right pleural effusion with streaky and patchy right basilar airspace disease, favor atelectasis over pneumonia. 4. Nodular hepatic contours suggesting cirrhosis. Small volume abdominopelvic ascites. 5. Stable infrarenal aortic aneurysm, maximal dimension 3.7 cm. Recommend follow-up every 2 years. This recommendation follows ACR consensus guidelines: White Paper of the ACR Incidental Findings Committee II on Vascular Findings. J Am Coll Radiol 2013; 10:789-794. Aortic Atherosclerosis (ICD10-I70.0). Electronically Signed   By: Keith Rake M.D.   On: 10/08/2020 17:36   US RENAL  Result Date: 09/18/2020 CLINICAL DATA:  Recurrent urinary tract infection. EXAM: RENAL / URINARY TRACT ULTRASOUND COMPLETE COMPARISON:  Renal ultrasound 04/05/2016.  Abdominal CT 07/28/2019. FINDINGS: Right Kidney: Renal measurements: 10.3 x 5.1 x 4.0 cm = volume: 109.8 mL. Mild renal cortical thinning and increased echogenicity. There is a cystic lesion in the upper pole which measures up to 1.8 cm. Prominent renal sinus fat. No hydronephrosis or suspicious cortical lesion. Left Kidney: Renal measurements: 10.5 x 6.2 x 4.2 cm = volume: 140.9 mL. Mild renal cortical thinning and increased echogenicity. Prominent renal sinus fat. No hydronephrosis or focal cortical lesion. Bladder: Not visualized. Other: Study is mildly limited by body habitus.  No evidence of ascites. IMPRESSION: 1. Both kidneys demonstrate mild cortical thinning and increased cortical echogenicity consistent with "chronic medical renal disease". 2. Small cyst in the upper pole of the right kidney. No hydronephrosis. 3. The bladder was not visualized. Electronically Signed   By: Richardean Sale M.D.   On: 09/18/2020 12:11     CBC Recent Labs  Lab 10/08/20 1725 10/09/20 0433  WBC 7.5 7.4  HGB 14.4 12.9*  HCT 45.1 39.1  PLT 160 153  MCV 111.6* 110.1*  MCH 35.6* 36.3*  MCHC 31.9 33.0  RDW 14.3 14.0  LYMPHSABS 2.0  --   MONOABS 0.7  --   EOSABS 0.1  --    BASOSABS 0.1  --     Chemistries  Recent Labs  Lab 10/08/20 1725  NA 132*  K 3.7  CL 89*  CO2 33*  GLUCOSE 91  BUN 50*  CREATININE 6.48*  CALCIUM 10.6*  AST 58*  ALT 68*  ALKPHOS 69  BILITOT 0.7   ------------------------------------------------------------------------------------------------------------------ No results for input(s): CHOL, HDL, LDLCALC, TRIG, CHOLHDL, LDLDIRECT in the last 72 hours.  Lab Results  Component Value Date   HGBA1C 5.6 10/08/2020   ------------------------------------------------------------------------------------------------------------------ No results for input(s): TSH, T4TOTAL, T3FREE, THYROIDAB in the last 72 hours.  Invalid input(s): FREET3 ------------------------------------------------------------------------------------------------------------------ No results for input(s): VITAMINB12, FOLATE, FERRITIN, TIBC, IRON, RETICCTPCT in the last 72 hours.  Coagulation profile No results for input(s): INR, PROTIME in the last 168 hours.  No results for input(s): DDIMER in the last 72 hours.  Cardiac Enzymes No results for input(s): CKMB, TROPONINI, MYOGLOBIN in the last 168 hours.  Invalid input(s): CK ------------------------------------------------------------------------------------------------------------------    Component Value Date/Time   BNP 1,349.3 (H) 04/10/2019 1244     Roxan Hockey M.D on 10/09/2020 at 11:42 AM  Go to [email or web address] - for contact info  [facility] - Office  [phone]

## 2020-10-10 ENCOUNTER — Ambulatory Visit: Admit: 2020-10-10 | Payer: Medicare Other | Admitting: Cardiology

## 2020-10-10 DIAGNOSIS — I10 Essential (primary) hypertension: Secondary | ICD-10-CM

## 2020-10-10 DIAGNOSIS — N186 End stage renal disease: Secondary | ICD-10-CM | POA: Diagnosis not present

## 2020-10-10 DIAGNOSIS — I2510 Atherosclerotic heart disease of native coronary artery without angina pectoris: Secondary | ICD-10-CM | POA: Diagnosis not present

## 2020-10-10 DIAGNOSIS — K529 Noninfective gastroenteritis and colitis, unspecified: Secondary | ICD-10-CM | POA: Diagnosis not present

## 2020-10-10 LAB — RENAL FUNCTION PANEL
Albumin: 2.4 g/dL — ABNORMAL LOW (ref 3.5–5.0)
Anion gap: 11 (ref 5–15)
BUN: 45 mg/dL — ABNORMAL HIGH (ref 8–23)
CO2: 27 mmol/L (ref 22–32)
Calcium: 9.2 mg/dL (ref 8.9–10.3)
Chloride: 95 mmol/L — ABNORMAL LOW (ref 98–111)
Creatinine, Ser: 6.39 mg/dL — ABNORMAL HIGH (ref 0.61–1.24)
GFR, Estimated: 9 mL/min — ABNORMAL LOW (ref >60)
Glucose, Bld: 102 mg/dL — ABNORMAL HIGH (ref 70–99)
Phosphorus: 4 mg/dL (ref 2.5–4.6)
Potassium: 3.5 mmol/L (ref 3.5–5.1)
Sodium: 133 mmol/L — ABNORMAL LOW (ref 135–145)

## 2020-10-10 LAB — GLUCOSE, CAPILLARY
Glucose-Capillary: 75 mg/dL (ref 70–99)
Glucose-Capillary: 80 mg/dL (ref 70–99)
Glucose-Capillary: 81 mg/dL (ref 70–99)
Glucose-Capillary: 83 mg/dL (ref 70–99)
Glucose-Capillary: 83 mg/dL (ref 70–99)
Glucose-Capillary: 85 mg/dL (ref 70–99)

## 2020-10-10 LAB — CBC
HCT: 40.4 % (ref 39.0–52.0)
Hemoglobin: 13.2 g/dL (ref 13.0–17.0)
MCH: 35.5 pg — ABNORMAL HIGH (ref 26.0–34.0)
MCHC: 32.7 g/dL (ref 30.0–36.0)
MCV: 108.6 fL — ABNORMAL HIGH (ref 80.0–100.0)
Platelets: 139 10*3/uL — ABNORMAL LOW (ref 150–400)
RBC: 3.72 MIL/uL — ABNORMAL LOW (ref 4.22–5.81)
RDW: 14.2 % (ref 11.5–15.5)
WBC: 7 10*3/uL (ref 4.0–10.5)
nRBC: 0 % (ref 0.0–0.2)

## 2020-10-10 LAB — LIPID PANEL
Cholesterol: 63 mg/dL (ref 0–200)
HDL: 34 mg/dL — ABNORMAL LOW (ref >40)
LDL Cholesterol: 16 mg/dL (ref 0–99)
Total CHOL/HDL Ratio: 1.9 RATIO
Triglycerides: 64 mg/dL (ref <150)
VLDL: 13 mg/dL (ref 0–40)

## 2020-10-10 LAB — AMMONIA: Ammonia: 14 umol/L (ref 9–35)

## 2020-10-10 SURGERY — ECHOCARDIOGRAM, TRANSESOPHAGEAL
Anesthesia: Monitor Anesthesia Care

## 2020-10-10 MED ORDER — HEPARIN SODIUM (PORCINE) 5000 UNIT/ML IJ SOLN
5000.0000 [IU] | Freq: Three times a day (TID) | INTRAMUSCULAR | Status: DC
  Administered 2020-10-11 – 2020-10-13 (×6): 5000 [IU] via SUBCUTANEOUS
  Filled 2020-10-10 (×6): qty 1

## 2020-10-10 MED ORDER — HYDROCODONE-ACETAMINOPHEN 5-325 MG PO TABS
1.0000 | ORAL_TABLET | Freq: Four times a day (QID) | ORAL | Status: DC | PRN
Start: 2020-10-10 — End: 2020-10-13
  Administered 2020-10-10 – 2020-10-11 (×2): 1 via ORAL
  Filled 2020-10-10 (×2): qty 1

## 2020-10-10 NOTE — Plan of Care (Signed)

## 2020-10-10 NOTE — Progress Notes (Signed)
Patient Demographics:    [name], is a 70 y.o. male, DOB - 1950-12-31, NWG:[id number]  Admit date - 10/08/2020   Admitting Physician Courage Denton Brick, MD  Outpatient Primary MD for the patient is Pradhan, Tilden Fossa, MD  LOS - 1   Chief Complaint  Patient presents with  . Abdominal Pain        Subjective:    [name] today has no fevers, no emesis,  No chest pain,     Assessment  & Plan :    Active Problems:   COPD (chronic obstructive pulmonary disease) (HCC)   Diabetes mellitus with renal manifestations, uncontrolled (HCC)   Benign essential HTN   CAD (coronary artery disease)   Diabetes mellitus with nephropathy (HCC)   Chronic diastolic CHF (congestive heart failure) (HCC)   ESRD (end stage renal disease) (HCC)   History of non-ST elevation myocardial infarction (NSTEMI)   Benign prostatic hyperplasia with urinary obstruction   Colitis   Brief Summary:- 70 y.o. male with DM, COPD, CAD as well as ESRD-  Currently on PD but does have a patent AVF admitted on 10/08/20 with possible Colitis, patient was brought to ED for some confusion, CT abdomen pelvis showing mild wall thickening for ascending colon, with working diagnosis of colitis, he was started on Rocephin and Flagyl, patient was transferred from [facility] to [place] due to his peritoneal dialysis needs.   A/p 1)Abd Pain and Emesis-- Colitis Vs SBP -SIRS related to colitis, as cell count only 44 cells, with 2% neutrophils, which is not consistent with peritonitis from PD infection, follow on gram stain and culture . 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 NASH, -Elevated LFTs noted -Ammonia within normal limit, further work-up as an outpatient if indicated  CAD/Prior NSTEMI---s/p prior angioplasty with stent-- s/p  prior stenting of RCA in 1999 b. cath in 2003 showing moderate CAD -Chest pain-free, continue aspirin Coreg and Lipitor  ESRD - on HD since 02/2019, started on PD in 07/2020 -Management per renal -Patient has a functional left upper extremity AV fistula which can be used in the meantime -Hold Bumex due to poor oral intake with emesis and risk for dehydration  COPD--quit smoking 11/2019, no acute exacerbation, bronchodilators as ordered  DM2, well controlled -A1c is 5.6 reflecting excellent diabetic control PTA Use Novolog/Humalog Sliding scale insulin with Accu-Cheks/Fingersticks as ordered   Chronic Back Pain/Osteoporosis- has CAGE in his back -Continue Enablex - At times he gets delirium with opiates at times- -Preferred narcotic agents for pain control are hydromorphone, fentanyl, and methadone. Morphine should not be used.  Baclofen should be avoided  Depression--- stable, continue Wellbutrin, Zoloft was added, patient takes risperidone nightly   Status is: Inpatient  Remains inpatient appropriate because:See disposition above  CODE STATUS: Full code  Disposition: The patient is from: Home              Anticipated d/c is to: Home              Anticipated d/c date is: 2 days              Patient currently is not medically  stable to d/c. Barriers: Not Clinically Stable-   Code Status :  -  Code Status: Full Code   Family Communication:   patient is alert, awake and coherent Discussed with pt's wife  Consults  :  Nephrology  DVT Prophylaxis  :   - SCDs  [place] heparin   Lab Results  Component Value Date   PLT 139 (L) 10/10/2020    Inpatient Medications  Scheduled Meds: . aspirin EC  81 mg Oral Q breakfast  . atorvastatin  40 mg Oral Daily  . budesonide (PULMICORT) nebulizer solution  0.25 mg Nebulization BID  . buPROPion  150 mg Oral BID  . carvedilol  25 mg Oral 2 times per day on Sun Mon Wed Fri  . carvedilol  25 mg Oral Once per day on Tue Thu Sat  .  Chlorhexidine Gluconate Cloth  6 each Topical Q0600  . darifenacin  7.5 mg Oral Daily  . enoxaparin (LOVENOX) injection  30 mg Subcutaneous Q24H  . gentamicin cream  1 application Topical Daily  . insulin aspart  0-9 Units Subcutaneous Q6H  . magnesium oxide  400 mg Oral Daily  . multivitamin  1 tablet Oral Daily  . pantoprazole (PROTONIX) IV  40 mg Intravenous Q24H  . risperiDONE  0.5 mg Oral QHS  . rOPINIRole  0.25 mg Oral QHS  . sertraline  50 mg Oral Daily  . tamsulosin  0.4 mg Oral QPC supper   Continuous Infusions: . cefTRIAXone (ROCEPHIN)  IV 2 g (10/09/20 2106)  . dialysis solution 1.5% low-MG/low-CA    . metronidazole 500 mg (10/10/20 1530)   PRN Meds:.albuterol, bisacodyl, fentaNYL (SUBLIMAZE) injection, dianeal solution for CAPD/CCPD with heparin, HYDROmorphone, lactulose, linaclotide, promethazine, senna    Anti-infectives (From admission, onward)   Start     Dose/Rate Route Frequency Ordered Stop   10/09/20 2200  cefTRIAXone (ROCEPHIN) 2 g in sodium chloride 0.9 % 100 mL IVPB        2 g 200 mL/hr over 30 Minutes Intravenous Every 24 hours 10/09/20 1317     10/08/20 2145  ciprofloxacin (CIPRO) IVPB 400 mg        400 mg 200 mL/hr over 60 Minutes Intravenous  Once 10/08/20 2134 10/09/20 0221   10/08/20 2030  metroNIDAZOLE (FLAGYL) IVPB 500 mg        500 mg 100 mL/hr over 60 Minutes Intravenous Every 8 hours 10/08/20 2027     10/08/20 1945  metroNIDAZOLE (FLAGYL) IVPB 500 mg  Status:  Discontinued        500 mg 100 mL/hr over 60 Minutes Intravenous  Once 10/08/20 1944 10/08/20 2027   10/08/20 1945  ciprofloxacin (CIPRO) IVPB 400 mg  Status:  Discontinued        400 mg 200 mL/hr over 60 Minutes Intravenous  Once 10/08/20 1944 10/08/20 2027        Objective:   Vitals:   10/09/20 2057 10/10/20 0202 10/10/20 0605 10/10/20 1521  BP: (!) 144/70 113/73 114/64 124/66  Pulse: (!) 58 60 (!) 59 60  Resp: 17 16 17 16   Temp: 98.1 F (36.7 C) 97.7 F (36.5 C) 98 F (36.7  C) 97.9 F (36.6 C)  TempSrc: Oral Oral Oral Oral  SpO2: 98% 99% 94% 99%  Weight:      Height:        Wt Readings from Last 3 Encounters:  10/09/20 96.7 kg  09/21/20 94.3 kg  09/01/20 94.5 kg     Intake/Output Summary (Last 24  hours) at 10/10/2020 1552 Last data filed at 10/10/2020 1057 Gross per 24 hour  Intake 120411 ml  Output 120164 ml  Net 247 ml   Physical Exam  Awake Alert, Oriented X 3, No new F.N deficits, Normal affect Symmetrical Chest wall movement, Good air movement bilaterally, CTAB RRR,No Gallops,Rubs or new Murmurs, No Parasternal Heave +ve B.Sounds, Abd Soft, mild diffuse abdominal tenderness, PD catheter in the right lower abdomen No Cyanosis, Clubbing or edema, No new Rash or bruise      Data Review:   Micro Results Recent Results (from the past 240 hour(s))  SARS CORONAVIRUS 2 (TAT 6-24 HRS) Nasopharyngeal Nasopharyngeal Swab     Status: None   Collection Time: 10/08/20  7:37 PM   Specimen: Nasopharyngeal Swab  Result Value Ref Range Status   SARS Coronavirus 2 NEGATIVE NEGATIVE Final    Comment: (NOTE) SARS-CoV-2 target nucleic acids are NOT DETECTED.  The SARS-CoV-2 RNA is generally detectable in upper and lower respiratory specimens during the acute phase of infection. Negative results do not preclude SARS-CoV-2 infection, do not rule out co-infections with other pathogens, and should not be used as the sole basis for treatment or other patient management decisions. Negative results must be combined with clinical observations, patient history, and epidemiological information. The expected result is Negative.  Fact Sheet for Patients: SugarRoll.be  Fact Sheet for Healthcare Providers: https://www.woods-mathews.com/  This test is not yet approved or cleared by the [place] FDA and  has been authorized for detection and/or diagnosis of SARS-CoV-2 by FDA under an Emergency Use Authorization  (EUA). This EUA will remain  in effect (meaning this test can be used) for the duration of the COVID-19 declaration under Se ction 564(b)(1) of the Act, 21 U.S.C. section 360bbb-3(b)(1), unless the authorization is terminated or revoked sooner.  Performed at [facility], [place] [address] [place], [place] [address]   Body fluid culture w Gram Stain     Status: None (Preliminary result)   Collection Time: 10/09/20  6:09 PM   Specimen: Body Fluid  Result Value Ref Range Status   Specimen Description FLUID PERITONEAL  Final   Special Requests Normal  Final   Gram Stain   Final    FEW WBC SEEN BOTH PMN AND MONONUCLEAR NO ORGANISMS SEEN    Culture   Final    NO GROWTH < 24 HOURS Performed at [facility], [place] [address] [place], [place] [address]    Report Status PENDING  Incomplete    Radiology Reports CT ABDOMEN PELVIS WO CONTRAST  Result Date: 10/08/2020 CLINICAL DATA:  Acute abdominal pain. Nausea and vomiting. Patient reports right lower abdominal pain for 3 days. EXAM: CT ABDOMEN AND PELVIS WITHOUT CONTRAST TECHNIQUE: Multidetector CT imaging of the abdomen and pelvis was performed following the standard protocol without IV contrast. COMPARISON:  CT 07/28/2019 FINDINGS: Lower chest: Trace right pleural effusion with streaky and patchy right basilar airspace disease. Normal heart size. Coronary artery calcifications. Pacemaker partially visualized. No pericardial effusion. Hepatobiliary: No evidence of focal liver abnormality. Slight nodular contours again seen. Cholecystectomy. No biliary dilatation. Pancreas: Parenchymal atrophy. No ductal dilatation or inflammation. Spleen: Normal in size without focal abnormality. Adrenals/Urinary Tract: Minimal left adrenal thickening. No adrenal nodule bilaterally. Bilateral renal parenchymal thinning. No hydronephrosis. Vascular calcifications, no renal calculi. Exophytic 14 mm slightly hyperdense lesion from the upper right kidney is  not significantly changed from prior. Partially distended urinary bladder. Stomach/Bowel: Mild distal esophageal wall thickening. Unremarkable  unenhanced stomach. Occasional fluid-filled small bowel without obstruction or inflammation. Appendix not visualized, appendectomy per history. Mild wall thickening of the ascending colon. Mixed liquid and solid stool in the transverse colon. Formed stool distally. No pericolonic inflammation. Vascular/Lymphatic: Advanced aortic and branch atherosclerosis. Infrarenal aortic aneurysm with maximal dimension 3.7 cm, unchanged. No periaortic stranding. Mild right common iliac aneurysmal dilatation at 2 cm. No bulky abdominopelvic adenopathy. Reproductive: Prostate is unremarkable. Other: Small volume abdominopelvic ascites. Peritoneal dialysis catheter enters right lower abdomen, courses into the pelvis with tip in the right lower quadrant. There is no adjacent fluid collection or inflammation. No free air. No abdominal wall hernia. Musculoskeletal: L2 compression fracture with vertebral augmentation, stable from October 2021 lumbar spine CT. Postsurgical change in the lower lumbar spine. Bones diffusely under mineralized. Surgical hardware in the left proximal femur. There are no acute or suspicious osseous abnormalities. IMPRESSION: 1. Mild wall thickening of the ascending colon, can be seen with colitis. 2. Peritoneal dialysis catheter with tip in the right lower quadrant. No adjacent fluid collection or inflammation. Small volume abdominopelvic ascites. 3. Trace right pleural effusion with streaky and patchy right basilar airspace disease, favor atelectasis over pneumonia. 4. Nodular hepatic contours suggesting cirrhosis. Small volume abdominopelvic ascites. 5. Stable infrarenal aortic aneurysm, maximal dimension 3.7 cm. Recommend follow-up every 2 years. This recommendation follows ACR consensus guidelines: White Paper of the ACR Incidental Findings Committee II on Vascular  Findings. J Am Coll Radiol 2013; 10:789-794. Aortic Atherosclerosis (ICD10-I70.0). Electronically Signed   By: Keith Rake M.D.   On: 10/08/2020 17:36   US RENAL  Result Date: 09/18/2020 CLINICAL DATA:  Recurrent urinary tract infection. EXAM: RENAL / URINARY TRACT ULTRASOUND COMPLETE COMPARISON:  Renal ultrasound 04/05/2016.  Abdominal CT 07/28/2019. FINDINGS: Right Kidney: Renal measurements: 10.3 x 5.1 x 4.0 cm = volume: 109.8 mL. Mild renal cortical thinning and increased echogenicity. There is a cystic lesion in the upper pole which measures up to 1.8 cm. Prominent renal sinus fat. No hydronephrosis or suspicious cortical lesion. Left Kidney: Renal measurements: 10.5 x 6.2 x 4.2 cm = volume: 140.9 mL. Mild renal cortical thinning and increased echogenicity. Prominent renal sinus fat. No hydronephrosis or focal cortical lesion. Bladder: Not visualized. Other: Study is mildly limited by body habitus.  No evidence of ascites. IMPRESSION: 1. Both kidneys demonstrate mild cortical thinning and increased cortical echogenicity consistent with "chronic medical renal disease". 2. Small cyst in the upper pole of the right kidney. No hydronephrosis. 3. The bladder was not visualized. Electronically Signed   By: Richardean Sale M.D.   On: 09/18/2020 12:11     CBC Recent Labs  Lab 10/08/20 1725 10/09/20 0433 10/10/20 0837  WBC 7.5 7.4 7.0  HGB 14.4 12.9* 13.2  HCT 45.1 39.1 40.4  PLT 160 153 139*  MCV 111.6* 110.1* 108.6*  MCH 35.6* 36.3* 35.5*  MCHC 31.9 33.0 32.7  RDW 14.3 14.0 14.2  LYMPHSABS 2.0  --   --   MONOABS 0.7  --   --   EOSABS 0.1  --   --   BASOSABS 0.1  --   --     Chemistries  Recent Labs  Lab 10/08/20 1725 10/10/20 0837  NA 132* 133*  K 3.7 3.5  CL 89* 95*  CO2 33* 27  GLUCOSE 91 102*  BUN 50* 45*  CREATININE 6.48* 6.39*  CALCIUM 10.6* 9.2  AST 58*  --   ALT 68*  --   ALKPHOS 69  --  BILITOT 0.7  --     ------------------------------------------------------------------------------------------------------------------ Recent Labs    10/10/20 0237  CHOL 63  HDL 34*  LDLCALC 16  TRIG 64  CHOLHDL 1.9    Lab Results  Component Value Date   HGBA1C 5.6 10/08/2020   ------------------------------------------------------------------------------------------------------------------ No results for input(s): TSH, T4TOTAL, T3FREE, THYROIDAB in the last 72 hours.  Invalid input(s): FREET3 ------------------------------------------------------------------------------------------------------------------ No results for input(s): VITAMINB12, FOLATE, FERRITIN, TIBC, IRON, RETICCTPCT in the last 72 hours.  Coagulation profile No results for input(s): INR, PROTIME in the last 168 hours.  No results for input(s): DDIMER in the last 72 hours.  Cardiac Enzymes No results for input(s): CKMB, TROPONINI, MYOGLOBIN in the last 168 hours.  Invalid input(s): CK ------------------------------------------------------------------------------------------------------------------    Component Value Date/Time   BNP 1,349.3 (H) 04/10/2019 1244     Emeline Gins Latisha Lasch M.D on 10/10/2020 at 3:52 PM  Go to [email or web address] - for contact info  [facility] - Office  [phone]

## 2020-10-10 NOTE — Progress Notes (Signed)
[facility] Progress Note   70 y.o. male with DM, COPD, CAD,  ESRD recently started on PD a mth ago but does have a patent lt AVF.  He lives in [place]- follows with Candiss Norse with [facility].  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  He p/w  right sided ascending abd pain. Wife states no change in PD fluid color but was noted to slightly confused today. Also on pain meds at home.  CT  showed mild wall thickening fo ascending colon- the working diagnosis is colitis and he was started on cipro and flagyl.     Assessment/ Plan:   1.Renal- ESRD on PD.   Cell count only 44 cells and 2% neutrophils which is not c/w peritonitis from PD infection.   Seen on PD; usually at home 4 exchanges at night with 3L fill and then 5th fill with midday drain. Wife has been using 1.5% recently bec of hypotension and anorexia.  He still has fluid in the abd and I have notified the HD unit to drain him.  2. Hypertension/volume  - does not seem overloaded-  Minimal UF with HD today  3. Colitis-  Started on IV flagyl and cipro with seemingly good results-  WBC is normal  4. Anemia  - not an issue right now 5. Confusion-  Hopefully due to pain meds and will clear   Subjective:   States pain dissipated but then returned. Denies f/cn/v/ dyspnea/ cough.   Objective:   BP 114/64 (BP Location: Right Arm)   Pulse (!) 59   Temp 98 F (36.7 C) (Oral)   Resp 17   Ht 6\' 4"  (1.93 m)   Wt 96.7 kg   SpO2 94%   BMI 25.95 kg/m   Intake/Output Summary (Last 24 hours) at 10/10/2020 0946 Last data filed at 10/10/2020 0800 Gross per 24 hour  Intake 1160 ml  Output 750 ml  Net 410 ml   Weight change: 2.352 kg  Physical Exam: General: alert, slightly confused but very pleasant Neck: no JVD Heart: RRR Lungs: mostly clear Abdomen: PD cath in right lower quad-  No tunnel tenderness-  Soft, minimal tenderness-  No rebound or guarding Extremities: no  edema Skin: warm and dry Neuro: alert, slightly confused Left upper arm AVF-  Patent   Imaging: CT ABDOMEN PELVIS WO CONTRAST  Result Date: 10/08/2020 CLINICAL DATA:  Acute abdominal pain. Nausea and vomiting. Patient reports right lower abdominal pain for 3 days. EXAM: CT ABDOMEN AND PELVIS WITHOUT CONTRAST TECHNIQUE: Multidetector CT imaging of the abdomen and pelvis was performed following the standard protocol without IV contrast. COMPARISON:  CT 07/28/2019 FINDINGS: Lower chest: Trace right pleural effusion with streaky and patchy right basilar airspace disease. Normal heart size. Coronary artery calcifications. Pacemaker partially visualized. No pericardial effusion. Hepatobiliary: No evidence of focal liver abnormality. Slight nodular contours again seen. Cholecystectomy. No biliary dilatation. Pancreas: Parenchymal atrophy. No ductal dilatation or inflammation. Spleen: Normal in size without focal abnormality. Adrenals/Urinary Tract: Minimal left adrenal thickening. No adrenal nodule bilaterally. Bilateral renal parenchymal thinning. No hydronephrosis. Vascular calcifications, no renal calculi. Exophytic 14 mm slightly hyperdense lesion from the upper right kidney is not significantly changed from prior. Partially distended urinary bladder. Stomach/Bowel: Mild distal esophageal wall thickening. Unremarkable unenhanced stomach. Occasional fluid-filled small bowel without obstruction or inflammation. Appendix not visualized, appendectomy per history. Mild wall thickening of the ascending colon. Mixed liquid and solid stool in the  transverse colon. Formed stool distally. No pericolonic inflammation. Vascular/Lymphatic: Advanced aortic and branch atherosclerosis. Infrarenal aortic aneurysm with maximal dimension 3.7 cm, unchanged. No periaortic stranding. Mild right common iliac aneurysmal dilatation at 2 cm. No bulky abdominopelvic adenopathy. Reproductive: Prostate is unremarkable. Other: Small volume  abdominopelvic ascites. Peritoneal dialysis catheter enters right lower abdomen, courses into the pelvis with tip in the right lower quadrant. There is no adjacent fluid collection or inflammation. No free air. No abdominal wall hernia. Musculoskeletal: L2 compression fracture with vertebral augmentation, stable from October 2021 lumbar spine CT. Postsurgical change in the lower lumbar spine. Bones diffusely under mineralized. Surgical hardware in the left proximal femur. There are no acute or suspicious osseous abnormalities. IMPRESSION: 1. Mild wall thickening of the ascending colon, can be seen with colitis. 2. Peritoneal dialysis catheter with tip in the right lower quadrant. No adjacent fluid collection or inflammation. Small volume abdominopelvic ascites. 3. Trace right pleural effusion with streaky and patchy right basilar airspace disease, favor atelectasis over pneumonia. 4. Nodular hepatic contours suggesting cirrhosis. Small volume abdominopelvic ascites. 5. Stable infrarenal aortic aneurysm, maximal dimension 3.7 cm. Recommend follow-up every 2 years. This recommendation follows ACR consensus guidelines: White Paper of the ACR Incidental Findings Committee II on Vascular Findings. J Am Coll Radiol 2013; 10:789-794. Aortic Atherosclerosis (ICD10-I70.0). Electronically Signed   By: Keith Rake M.D.   On: 10/08/2020 17:36    Labs: BMET Recent Labs  Lab 10/08/20 1725 10/10/20 0837  NA 132* 133*  K 3.7 3.5  CL 89* 95*  CO2 33* 27  GLUCOSE 91 102*  BUN 50* 45*  CREATININE 6.48* 6.39*  CALCIUM 10.6* 9.2  PHOS  --  4.0   CBC Recent Labs  Lab 10/08/20 1725 10/09/20 0433 10/10/20 0837  WBC 7.5 7.4 7.0  NEUTROABS 4.5  --   --   HGB 14.4 12.9* 13.2  HCT 45.1 39.1 40.4  MCV 111.6* 110.1* 108.6*  PLT 160 153 139*    Medications:    . aspirin EC  81 mg Oral Q breakfast  . atorvastatin  40 mg Oral Daily  . budesonide (PULMICORT) nebulizer solution  0.25 mg Nebulization BID  .  buPROPion  150 mg Oral BID  . carvedilol  25 mg Oral 2 times per day on Sun Mon Wed Fri  . carvedilol  25 mg Oral Once per day on Tue Thu Sat  . Chlorhexidine Gluconate Cloth  6 each Topical Q0600  . darifenacin  7.5 mg Oral Daily  . enoxaparin (LOVENOX) injection  30 mg Subcutaneous Q24H  . gentamicin cream  1 application Topical Daily  . insulin aspart  0-9 Units Subcutaneous Q6H  . magnesium oxide  400 mg Oral Daily  . multivitamin  1 tablet Oral Daily  . pantoprazole (PROTONIX) IV  40 mg Intravenous Q24H  . risperiDONE  0.5 mg Oral QHS  . rOPINIRole  0.25 mg Oral QHS  . sertraline  50 mg Oral Daily  . tamsulosin  0.4 mg Oral QPC supper      Otelia Santee, MD 10/10/2020, 9:46 AM

## 2020-10-11 DIAGNOSIS — I252 Old myocardial infarction: Secondary | ICD-10-CM | POA: Diagnosis not present

## 2020-10-11 DIAGNOSIS — K529 Noninfective gastroenteritis and colitis, unspecified: Secondary | ICD-10-CM | POA: Diagnosis not present

## 2020-10-11 DIAGNOSIS — N138 Other obstructive and reflux uropathy: Secondary | ICD-10-CM

## 2020-10-11 DIAGNOSIS — N401 Enlarged prostate with lower urinary tract symptoms: Secondary | ICD-10-CM | POA: Diagnosis not present

## 2020-10-11 DIAGNOSIS — N186 End stage renal disease: Secondary | ICD-10-CM | POA: Diagnosis not present

## 2020-10-11 LAB — GLUCOSE, CAPILLARY
Glucose-Capillary: 89 mg/dL (ref 70–99)
Glucose-Capillary: 89 mg/dL (ref 70–99)
Glucose-Capillary: 90 mg/dL (ref 70–99)
Glucose-Capillary: 95 mg/dL (ref 70–99)
Glucose-Capillary: 97 mg/dL (ref 70–99)

## 2020-10-11 MED ORDER — DELFLEX-LC/1.5% DEXTROSE 344 MOSM/L IP SOLN: INTRAPERITONEAL | Status: DC

## 2020-10-11 MED ORDER — BISACODYL 10 MG RE SUPP
10.0000 mg | Freq: Once | RECTAL | Status: AC
  Administered 2020-10-11: 10 mg via RECTAL
  Filled 2020-10-11: qty 1

## 2020-10-11 MED ORDER — DELFLEX-LC/2.5% DEXTROSE 394 MOSM/L IP SOLN: INTRAPERITONEAL | Status: DC

## 2020-10-11 MED ORDER — POLYETHYLENE GLYCOL 3350 17 G PO PACK
17.0000 g | PACK | Freq: Two times a day (BID) | ORAL | Status: DC
  Administered 2020-10-11 – 2020-10-12 (×3): 17 g via ORAL
  Filled 2020-10-11 (×3): qty 1

## 2020-10-11 NOTE — Progress Notes (Addendum)
PROGRESS NOTE    [name]  VHQ:[id number] DOB: [birth year] DOA: 10/08/2020 PCP: Tobe Sos, MD   Brief Narrative: [name] is a 70 y.o. male with history of diabetes mellitus type 2, COPD, CAD PCI, ESRD previously on PD.  Patient presented secondary to confusion with associated abdominal pain.  CT abdomen pelvis was consistent with possible colitis.  Peritoneal fluid was negative for evidence of SBP.  Patient has been managed on IV antibiotics with improvement.   Assessment & Plan:   Active Problems:   COPD (chronic obstructive pulmonary disease) (HCC)   Diabetes mellitus with renal manifestations, uncontrolled (HCC)   Benign essential HTN   CAD (coronary artery disease)   Diabetes mellitus with nephropathy (HCC)   Chronic diastolic CHF (congestive heart failure) (HCC)   ESRD (end stage renal disease) ([place])   History of non-ST elevation myocardial infarction (NSTEMI)   Benign prostatic hyperplasia with urinary obstruction   Colitis   Colitis Abdominal pain CT abdomen pelvis significant for mild wall thickening of the ascending colon possibly colitis.  C. difficile is negative.  Peritoneal fluid does not suggest SBP.  Patient started empirically on ceftriaxone and Flagyl on admission. -Continue ceftriaxone/Flagyl for now  Emesis Improved with treatment of colitis.  Constipation Likely contributing to some of his discomfort.  No bowel movement since admission.  Issues with constipation as an outpatient. -Suppository x1 -MiraLAX twice daily  ESRD on PD No SBP suggested on ascites fluid analysis. Nephrology consulted for management.  COPD Currently quit. Wheezing on exam. -Continue Pulmicort BID, albuterol prn  Diabetes mellitus, type 2 Well controlled. Most recent hemoglobin A1C of 5.6% on 10/08/20 -Diet control  CAD s/p PCI History of STEMI Currently asymptomatic. -Continue aspirin, Coreg,  Chronic back pain Osteoporosis -Continue  Enablex  Depression -Continue Wellbutrin, Zoloft and risperidone  Infrarenal aortic aneurysm Stable on most recent CT abdomen/pelvis on admission with a maximal dimension of 3.7 cm.  Recommendation for follow-up every 2 years.  Chronic diastolic heart failure Stable. -Continue Coreg   DVT prophylaxis: Heparin Code Status:   Code Status: Full Code Family Communication: None at bedside Disposition Plan: Discharge home likely in 24 to 48 hours pending transition to oral antibiotics in addition to improvement of abdominal pain.   Consultants:   Nephrology  Procedures:   Peritoneal dialysis  Antimicrobials:  Ciprofloxacin IV  Ceftriaxone IV  Flagyl IV   Subjective: Patient mentioned worsened right sided abdominal pain worse on inspiration. Cough but cannot produce sputum.  Objective: Vitals:   10/10/20 0605 10/10/20 1521 10/10/20 2052 10/11/20 0426  BP: 114/64 124/66 138/77 (!) 141/70  Pulse: (!) 59 60 (!) 59 60  Resp: 17 16 18    Temp: 98 F (36.7 C) 97.9 F (36.6 C) 97.8 F (36.6 C) (!) 97.5 F (36.4 C)  TempSrc: Oral Oral Oral Oral  SpO2: 94% 99% 98% 96%  Weight:      Height:        Intake/Output Summary (Last 24 hours) at 10/11/2020 1103 Last data filed at 10/11/2020 0859 Gross per 24 hour  Intake 12185 ml  Output 12971 ml  Net -786 ml   Filed Weights   10/08/20 1647 10/08/20 2222 10/09/20 1915  Weight: 94.3 kg 94.2 kg 96.7 kg    Examination:  General exam: Appears calm and comfortable Respiratory system: Bilateral wheezing with diminished breath sounds. Respiratory effort normal. Cardiovascular system: S1 & S2 heard, RRR. No murmurs, rubs, gallops or clicks. Gastrointestinal system: Abdomen is nondistended,  soft and mildly tender on the right upper and lower quadrant. No organomegaly or masses felt. Normal bowel sounds heard. Central nervous system: Alert and oriented. No focal neurological deficits. Musculoskeletal: No edema. No calf  tenderness Skin: No cyanosis. No rashes Psychiatry: Judgement and insight appear normal. Mood & affect appropriate.     Data Reviewed: I have personally reviewed following labs and imaging studies  CBC Lab Results  Component Value Date   WBC 7.0 10/10/2020   RBC 3.72 (L) 10/10/2020   HGB 13.2 10/10/2020   HCT 40.4 10/10/2020   MCV 108.6 (H) 10/10/2020   MCH 35.5 (H) 10/10/2020   PLT 139 (L) 10/10/2020   MCHC 32.7 10/10/2020   RDW 14.2 10/10/2020   LYMPHSABS 2.0 10/08/2020   MONOABS 0.7 10/08/2020   EOSABS 0.1 10/08/2020   BASOSABS 0.1 70/35/0093     Last metabolic panel Lab Results  Component Value Date   NA 133 (L) 10/10/2020   K 3.5 10/10/2020   CL 95 (L) 10/10/2020   CO2 27 10/10/2020   BUN 45 (H) 10/10/2020   CREATININE 6.39 (H) 10/10/2020   GLUCOSE 102 (H) 10/10/2020   GFRNONAA 9 (L) 10/10/2020   GFRAA 14 (L) 11/23/2019   CALCIUM 9.2 10/10/2020   PHOS 4.0 10/10/2020   PROT 6.8 10/08/2020   ALBUMIN 2.4 (L) 10/10/2020   BILITOT 0.7 10/08/2020   ALKPHOS 69 10/08/2020   AST 58 (H) 10/08/2020   ALT 68 (H) 10/08/2020   ANIONGAP 11 10/10/2020    CBG (last 3)  Recent Labs    10/11/20 0010 10/11/20 0519 10/11/20 0520  GLUCAP 89 89 97     GFR: Estimated Creatinine Clearance: 13.8 mL/min (A) (by C-G formula based on SCr of 6.39 mg/dL (H)).  Coagulation Profile: No results for input(s): INR, PROTIME in the last 168 hours.  Recent Results (from the past 240 hour(s))  SARS CORONAVIRUS 2 (TAT 6-24 HRS) Nasopharyngeal Nasopharyngeal Swab     Status: None   Collection Time: 10/08/20  7:37 PM   Specimen: Nasopharyngeal Swab  Result Value Ref Range Status   SARS Coronavirus 2 NEGATIVE NEGATIVE Final    Comment: (NOTE) SARS-CoV-2 target nucleic acids are NOT DETECTED.  The SARS-CoV-2 RNA is generally detectable in upper and lower respiratory specimens during the acute phase of infection. Negative results do not preclude SARS-CoV-2 infection, do not rule  out co-infections with other pathogens, and should not be used as the sole basis for treatment or other patient management decisions. Negative results must be combined with clinical observations, patient history, and epidemiological information. The expected result is Negative.  Fact Sheet for Patients: SugarRoll.be  Fact Sheet for Healthcare Providers: https://www.woods-mathews.com/  This test is not yet approved or cleared by the [place] FDA and  has been authorized for detection and/or diagnosis of SARS-CoV-2 by FDA under an Emergency Use Authorization (EUA). This EUA will remain  in effect (meaning this test can be used) for the duration of the COVID-19 declaration under Se ction 564(b)(1) of the Act, 21 U.S.C. section 360bbb-3(b)(1), unless the authorization is terminated or revoked sooner.  Performed at [facility], [place] [address], [place], [place] [address]   Body fluid culture w Gram Stain     Status: None (Preliminary result)   Collection Time: 10/09/20  6:09 PM   Specimen: Body Fluid  Result Value Ref Range Status   Specimen Description FLUID PERITONEAL  Final   Special Requests Normal  Final   Gram Stain  Final    FEW WBC SEEN BOTH PMN AND MONONUCLEAR NO ORGANISMS SEEN    Culture   Final    NO GROWTH 2 DAYS Performed at [facility], [place] [address], [place], [place] [address]    Report Status PENDING  Incomplete        Radiology Studies: No results found.      Scheduled Meds: . aspirin EC  81 mg Oral Q breakfast  . atorvastatin  40 mg Oral Daily  . budesonide (PULMICORT) nebulizer solution  0.25 mg Nebulization BID  . buPROPion  150 mg Oral BID  . carvedilol  25 mg Oral 2 times per day on Sun Mon Wed Fri  . carvedilol  25 mg Oral Once per day on Tue Thu Sat  . Chlorhexidine Gluconate Cloth  6 each Topical Q0600  . darifenacin  7.5 mg Oral Daily  . gentamicin cream  1 application Topical  Daily  . heparin injection (subcutaneous)  5,000 Units Subcutaneous Q8H  . insulin aspart  0-9 Units Subcutaneous Q6H  . magnesium oxide  400 mg Oral Daily  . multivitamin  1 tablet Oral Daily  . pantoprazole (PROTONIX) IV  40 mg Intravenous Q24H  . risperiDONE  0.5 mg Oral QHS  . rOPINIRole  0.25 mg Oral QHS  . sertraline  50 mg Oral Daily  . tamsulosin  0.4 mg Oral QPC supper   Continuous Infusions: . cefTRIAXone (ROCEPHIN)  IV Stopped (10/10/20 2155)  . dialysis solution 1.5% low-MG/low-CA    . dialysis solution 2.5% low-MG/low-CA    . metronidazole 500 mg (10/11/20 0828)     LOS: 2 days     Cordelia Poche, MD [facility] 10/11/2020, 11:03 AM  If 7PM-7AM, please contact night-coverage [email or web address]

## 2020-10-11 NOTE — Progress Notes (Addendum)
[facility] Progress Note   70 y.o. male with DM, COPD, CAD,  ESRD recently started on PD a mth ago but does have a patent lt AVF.  He lives in [place]- follows with Candiss Norse with [facility].  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  He p/w  right sided ascending abd pain. Wife states no change in PD fluid color but was noted to slightly confused today. Also on pain meds at home.  CT  showed mild wall thickening fo ascending colon- the working diagnosis is colitis and he was started on cipro and flagyl.     Assessment/ Plan:   1.Renal- ESRD on PD.   Cell count only 44 cells and 2% neutrophils which is not c/w peritonitis from PD infection.   Seen on PD; usually at home 4 exchanges at night with 3L fill and then 5th fill with midday drain. Wife has been using 1.5% recently bec of hypotension and anorexia.  He has completed his cycles and has been drained but still hooked. Will have nurse come to remove from the cycler.  Basically even from cycler 5/24 AM. Overnight he was neg 486 ml.  Will alternate 1.5 with 2.5% Dianeal tonight; 4 exchanges - 2 with 1.5 and 2 with 2.5.  Need daily weights on floor scale with assistance in am after off and detached from cycler.  2. Hypertension/volume  - does not seem overloaded-  Minimal UF with HD today  3. Colitis-  Started on IV flagyl and cipro with seemingly good results-  WBC is normal  Not peritonitis from PD. 4. Anemia  - not an issue right now 5. Confusion-  Hopefully due to pain meds and will clear   Subjective:   States pain somewhat improved but still present. Feels  mouth is very dry but denies a sore throat or dyspnea.    Objective:   BP (!) 141/70 (BP Location: Right Arm)   Pulse 60   Temp (!) 97.5 F (36.4 C) (Oral)   Resp 18   Ht 6\' 4"  (1.93 m)   Wt 96.7 kg   SpO2 96%   BMI 25.95 kg/m   Intake/Output Summary (Last 24 hours) at 10/11/2020 0740 Last data filed  at 10/11/2020 0500 Gross per 24 hour  Intake 120391 ml  Output 120664 ml  Net -273 ml   Weight change:   Physical Exam: General: alert, slightly confused but very pleasant Neck: no JVD Heart: RRR Lungs: mostly clear Abdomen: PD cath in right lower quad-  No tunnel tenderness-  Soft, minimal tenderness-  No rebound or guarding Extremities: no edema Skin: warm and dry Neuro: alert, slightly confused Left upper arm BCF-  Patent   Imaging: No results found.  Labs: BMET Recent Labs  Lab 10/08/20 1725 10/10/20 0837  NA 132* 133*  K 3.7 3.5  CL 89* 95*  CO2 33* 27  GLUCOSE 91 102*  BUN 50* 45*  CREATININE 6.48* 6.39*  CALCIUM 10.6* 9.2  PHOS  --  4.0   CBC Recent Labs  Lab 10/08/20 1725 10/09/20 0433 10/10/20 0837  WBC 7.5 7.4 7.0  NEUTROABS 4.5  --   --   HGB 14.4 12.9* 13.2  HCT 45.1 39.1 40.4  MCV 111.6* 110.1* 108.6*  PLT 160 153 139*    Medications:    . aspirin EC  81 mg Oral Q breakfast  . atorvastatin  40 mg Oral Daily  . budesonide (  PULMICORT) nebulizer solution  0.25 mg Nebulization BID  . buPROPion  150 mg Oral BID  . carvedilol  25 mg Oral 2 times per day on Sun Mon Wed Fri  . carvedilol  25 mg Oral Once per day on Tue Thu Sat  . Chlorhexidine Gluconate Cloth  6 each Topical Q0600  . darifenacin  7.5 mg Oral Daily  . gentamicin cream  1 application Topical Daily  . heparin injection (subcutaneous)  5,000 Units Subcutaneous Q8H  . insulin aspart  0-9 Units Subcutaneous Q6H  . magnesium oxide  400 mg Oral Daily  . multivitamin  1 tablet Oral Daily  . pantoprazole (PROTONIX) IV  40 mg Intravenous Q24H  . risperiDONE  0.5 mg Oral QHS  . rOPINIRole  0.25 mg Oral QHS  . sertraline  50 mg Oral Daily  . tamsulosin  0.4 mg Oral QPC supper      Otelia Santee, MD 10/11/2020, 7:40 AM

## 2020-10-12 DIAGNOSIS — N401 Enlarged prostate with lower urinary tract symptoms: Secondary | ICD-10-CM | POA: Diagnosis not present

## 2020-10-12 DIAGNOSIS — K529 Noninfective gastroenteritis and colitis, unspecified: Secondary | ICD-10-CM | POA: Diagnosis not present

## 2020-10-12 DIAGNOSIS — N186 End stage renal disease: Secondary | ICD-10-CM | POA: Diagnosis not present

## 2020-10-12 DIAGNOSIS — I252 Old myocardial infarction: Secondary | ICD-10-CM | POA: Diagnosis not present

## 2020-10-12 LAB — GLUCOSE, CAPILLARY
Glucose-Capillary: 109 mg/dL — ABNORMAL HIGH (ref 70–99)
Glucose-Capillary: 123 mg/dL — ABNORMAL HIGH (ref 70–99)
Glucose-Capillary: 74 mg/dL (ref 70–99)
Glucose-Capillary: 84 mg/dL (ref 70–99)

## 2020-10-12 NOTE — Progress Notes (Signed)
PROGRESS NOTE    [name]  NGE:[id number] DOB: November 17, 1950 DOA: 10/08/2020 PCP: Tobe Sos, MD   Brief Narrative: [name] is a 70 y.o. male with history of diabetes mellitus type 2, COPD, CAD PCI, ESRD previously on PD.  Patient presented secondary to confusion with associated abdominal pain.  CT abdomen pelvis was consistent with possible colitis.  Peritoneal fluid was negative for evidence of SBP.  Patient has been managed on IV antibiotics with improvement.   Assessment & Plan:   Active Problems:   COPD (chronic obstructive pulmonary disease) (HCC)   Diabetes mellitus with renal manifestations, uncontrolled (HCC)   Benign essential HTN   CAD (coronary artery disease)   Diabetes mellitus with nephropathy (HCC)   Chronic diastolic CHF (congestive heart failure) (HCC)   ESRD (end stage renal disease) ([place])   History of non-ST elevation myocardial infarction (NSTEMI)   Benign prostatic hyperplasia with urinary obstruction   Colitis   Colitis Abdominal pain CT abdomen pelvis significant for mild wall thickening of the ascending colon possibly colitis.  C. difficile is negative.  Peritoneal fluid does not suggest SBP.  Patient started empirically on ceftriaxone and Flagyl on admission. -Continue ceftriaxone/Flagyl for now  Emesis Improved with treatment of colitis.  Constipation Likely contributing to some of his discomfort.  No bowel movement since admission.  Issues with constipation as an outpatient. -MiraLAX twice daily  ESRD on PD No SBP suggested on ascites fluid analysis. Nephrology consulted for management.  COPD Currently quit. Wheezing on exam. -Continue Pulmicort BID, albuterol prn  Diabetes mellitus, type 2 Well controlled. Most recent hemoglobin A1C of 5.6% on 10/08/20 -Diet control  CAD s/p PCI History of STEMI Currently asymptomatic. -Continue aspirin, Coreg,  Chronic back pain Osteoporosis -Continue  Enablex  Depression -Continue Wellbutrin, Zoloft and risperidone  Infrarenal aortic aneurysm Stable on most recent CT abdomen/pelvis on admission with a maximal dimension of 3.7 cm.  Recommendation for follow-up every 2 years.  Chronic diastolic heart failure Stable. -Continue Coreg   DVT prophylaxis: Heparin Code Status:   Code Status: Full Code Family Communication: None at bedside Disposition Plan: Discharge home likely in 24 hours pending transition to oral antibiotics tomorrow   Consultants:   Nephrology  Procedures:   Peritoneal dialysis  Antimicrobials:  Ciprofloxacin IV  Ceftriaxone IV  Flagyl IV   Subjective: Patient reports continued right-sided abdominal pain.  Pain is worse with inspiration and not with movement.  No bowel movement yesterday.  Objective: Vitals:   10/12/20 0601 10/12/20 0844 10/12/20 0900 10/12/20 1358  BP: 112/74  (!) 144/66 139/69  Pulse: 63  63 63  Resp: 19  20 16   Temp: 97.7 F (36.5 C)  98.3 F (36.8 C) 98.3 F (36.8 C)  TempSrc: Oral  Oral Oral  SpO2: 96% 94% 97% 96%  Weight: 93.6 kg  95.7 kg   Height:        Intake/Output Summary (Last 24 hours) at 10/12/2020 1712 Last data filed at 10/12/2020 1213 Gross per 24 hour  Intake 340 ml  Output 325 ml  Net 15 ml   Filed Weights   10/09/20 1915 10/12/20 0601 10/12/20 0900  Weight: 96.7 kg 93.6 kg 95.7 kg    Examination:  General exam: Appears calm and comfortable Respiratory system: Clear to auscultation. Respiratory effort normal. Cardiovascular system: S1 & S2 heard, RRR. No murmurs, rubs, gallops or clicks. Gastrointestinal system: Abdomen is nondistended, soft and mildly tender in RUQ/RLQ. No organomegaly or masses felt.  Normal bowel sounds heard. Central nervous system: Alert and oriented. No focal neurological deficits. Musculoskeletal: No edema. No calf tenderness Skin: No cyanosis. No rashes Psychiatry: Judgement and insight appear normal. Mood & affect  appropriate.      Data Reviewed: I have personally reviewed following labs and imaging studies  CBC Lab Results  Component Value Date   WBC 7.0 10/10/2020   RBC 3.72 (L) 10/10/2020   HGB 13.2 10/10/2020   HCT 40.4 10/10/2020   MCV 108.6 (H) 10/10/2020   MCH 35.5 (H) 10/10/2020   PLT 139 (L) 10/10/2020   MCHC 32.7 10/10/2020   RDW 14.2 10/10/2020   LYMPHSABS 2.0 10/08/2020   MONOABS 0.7 10/08/2020   EOSABS 0.1 10/08/2020   BASOSABS 0.1 78/58/8502     Last metabolic panel Lab Results  Component Value Date   NA 133 (L) 10/10/2020   K 3.5 10/10/2020   CL 95 (L) 10/10/2020   CO2 27 10/10/2020   BUN 45 (H) 10/10/2020   CREATININE 6.39 (H) 10/10/2020   GLUCOSE 102 (H) 10/10/2020   GFRNONAA 9 (L) 10/10/2020   GFRAA 14 (L) 11/23/2019   CALCIUM 9.2 10/10/2020   PHOS 4.0 10/10/2020   PROT 6.8 10/08/2020   ALBUMIN 2.4 (L) 10/10/2020   BILITOT 0.7 10/08/2020   ALKPHOS 69 10/08/2020   AST 58 (H) 10/08/2020   ALT 68 (H) 10/08/2020   ANIONGAP 11 10/10/2020    CBG (last 3)  Recent Labs    10/12/20 0005 10/12/20 0618 10/12/20 1210  GLUCAP 123* 74 84     GFR: Estimated Creatinine Clearance: 13.8 mL/min (A) (by C-G formula based on SCr of 6.39 mg/dL (H)).  Coagulation Profile: No results for input(s): INR, PROTIME in the last 168 hours.  Recent Results (from the past 240 hour(s))  SARS CORONAVIRUS 2 (TAT 6-24 HRS) Nasopharyngeal Nasopharyngeal Swab     Status: None   Collection Time: 10/08/20  7:37 PM   Specimen: Nasopharyngeal Swab  Result Value Ref Range Status   SARS Coronavirus 2 NEGATIVE NEGATIVE Final    Comment: (NOTE) SARS-CoV-2 target nucleic acids are NOT DETECTED.  The SARS-CoV-2 RNA is generally detectable in upper and lower respiratory specimens during the acute phase of infection. Negative results do not preclude SARS-CoV-2 infection, do not rule out co-infections with other pathogens, and should not be used as the sole basis for treatment or  other patient management decisions. Negative results must be combined with clinical observations, patient history, and epidemiological information. The expected result is Negative.  Fact Sheet for Patients: SugarRoll.be  Fact Sheet for Healthcare Providers: https://www.woods-mathews.com/  This test is not yet approved or cleared by the [place] FDA and  has been authorized for detection and/or diagnosis of SARS-CoV-2 by FDA under an Emergency Use Authorization (EUA). This EUA will remain  in effect (meaning this test can be used) for the duration of the COVID-19 declaration under Se ction 564(b)(1) of the Act, 21 U.S.C. section 360bbb-3(b)(1), unless the authorization is terminated or revoked sooner.  Performed at [facility], [place] [address] [place], [place] [address]   Body fluid culture w Gram Stain     Status: None (Preliminary result)   Collection Time: 10/09/20  6:09 PM   Specimen: Body Fluid  Result Value Ref Range Status   Specimen Description FLUID PERITONEAL  Final   Special Requests Normal  Final   Gram Stain   Final    FEW WBC SEEN BOTH PMN AND MONONUCLEAR NO ORGANISMS  SEEN    Culture   Final    NO GROWTH 3 DAYS Performed at [facility], [place] [address], [place], [place] [address]    Report Status PENDING  Incomplete        Radiology Studies: No results found.      Scheduled Meds: . aspirin EC  81 mg Oral Q breakfast  . atorvastatin  40 mg Oral Daily  . budesonide (PULMICORT) nebulizer solution  0.25 mg Nebulization BID  . buPROPion  150 mg Oral BID  . carvedilol  25 mg Oral 2 times per day on Sun Mon Wed Fri  . carvedilol  25 mg Oral Once per day on Tue Thu Sat  . Chlorhexidine Gluconate Cloth  6 each Topical Q0600  . darifenacin  7.5 mg Oral Daily  . gentamicin cream  1 application Topical Daily  . heparin injection (subcutaneous)  5,000 Units Subcutaneous Q8H  . insulin aspart  0-9  Units Subcutaneous Q6H  . magnesium oxide  400 mg Oral Daily  . multivitamin  1 tablet Oral Daily  . pantoprazole (PROTONIX) IV  40 mg Intravenous Q24H  . polyethylene glycol  17 g Oral BID  . risperiDONE  0.5 mg Oral QHS  . rOPINIRole  0.25 mg Oral QHS  . sertraline  50 mg Oral Daily  . tamsulosin  0.4 mg Oral QPC supper   Continuous Infusions: . cefTRIAXone (ROCEPHIN)  IV 2 g (10/11/20 2135)  . dialysis solution 1.5% low-MG/low-CA    . dialysis solution 2.5% low-MG/low-CA    . metronidazole 500 mg (10/12/20 1501)     LOS: 3 days     Cordelia Poche, MD [facility] 10/12/2020, 5:12 PM  If 7PM-7AM, please contact night-coverage [email or web address]

## 2020-10-12 NOTE — Care Management Important Message (Signed)
Important Message  Patient Details  Name: [name] MRN: [id number] Date of Birth: [birth year]   Medicare Important Message Given:  Yes     Orbie Pyo 10/12/2020, 3:08 PM

## 2020-10-12 NOTE — Plan of Care (Signed)

## 2020-10-12 NOTE — Progress Notes (Signed)
[facility] Progress Note   70 y.o. male with DM, COPD, CAD,  ESRD recently started on PD a mth ago but does have a patent lt AVF.  He lives in [place]- follows with Candiss Norse with [facility].  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  He p/w  right sided ascending abd pain. Wife states no change in PD fluid color but was noted to slightly confused today. Also on pain meds at home.  CT  showed mild wall thickening fo ascending colon- the working diagnosis is colitis and he was started on cipro and flagyl.     Assessment/ Plan:   1.Renal- ESRD on PD.   Cell count only 44 cells and 2% neutrophils which is not c/w peritonitis from PD infection.   Seen on PD; usually at home 4 exchanges at night with 3L fill and then 5th fill with midday drain. Wife has been using 1.5% recently bec of hypotension and anorexia.  He has completed his cycles and has been drained but still hooked. Will have nurse come to remove from the cycler.  Basically even from cycler 5/24 AM, neg  486 ml 5/25 and started  alternating 1.5 with 2.5% Dianeal evening of 5/25 and able to net UF 2.2L.  Need daily weights on floor scale with assistance in am after off and detached from cycler.  2. Hypertension/volume  - does not seem overloaded and tolerated incr UF -> actually breathing is more comfortable.   3. Colitis-  WBC is normal  Not peritonitis from PD. CDif is neg. On rocephin and flagyl. 4. Anemia  - not an issue right now 5. Confusion-  Hopefully due to pain meds and will clear   Subjective:   States pain definitely improved and breathing better as well.    Objective:   BP 112/74 (BP Location: Right Arm)   Pulse 63   Temp 97.7 F (36.5 C) (Oral)   Resp 19   Ht 6\' 4"  (1.93 m)   Wt 93.6 kg   SpO2 96%   BMI 25.12 kg/m   Intake/Output Summary (Last 24 hours) at 10/12/2020 0938 Last data filed at 10/11/2020 1733 Gross per 24 hour  Intake 12765 ml   Output 12871 ml  Net -106 ml   Weight change:   Physical Exam: General: alert, slightly confused but very pleasant Neck: no JVD Heart: RRR Lungs: mostly clear Abdomen: PD cath in right lower quad-  No tunnel tenderness-  Soft, minimal tenderness-  No rebound or guarding Extremities: no edema Skin: warm and dry Neuro: alert, slightly confused Left upper arm BCF-  Patent   Imaging: No results found.  Labs: BMET Recent Labs  Lab 10/08/20 1725 10/10/20 0837  NA 132* 133*  K 3.7 3.5  CL 89* 95*  CO2 33* 27  GLUCOSE 91 102*  BUN 50* 45*  CREATININE 6.48* 6.39*  CALCIUM 10.6* 9.2  PHOS  --  4.0   CBC Recent Labs  Lab 10/08/20 1725 10/09/20 0433 10/10/20 0837  WBC 7.5 7.4 7.0  NEUTROABS 4.5  --   --   HGB 14.4 12.9* 13.2  HCT 45.1 39.1 40.4  MCV 111.6* 110.1* 108.6*  PLT 160 153 139*    Medications:    . aspirin EC  81 mg Oral Q breakfast  . atorvastatin  40 mg Oral Daily  . budesonide (PULMICORT) nebulizer solution  0.25 mg Nebulization BID  . buPROPion  150 mg  Oral BID  . carvedilol  25 mg Oral 2 times per day on Sun Mon Wed Fri  . carvedilol  25 mg Oral Once per day on Tue Thu Sat  . Chlorhexidine Gluconate Cloth  6 each Topical Q0600  . darifenacin  7.5 mg Oral Daily  . gentamicin cream  1 application Topical Daily  . heparin injection (subcutaneous)  5,000 Units Subcutaneous Q8H  . insulin aspart  0-9 Units Subcutaneous Q6H  . magnesium oxide  400 mg Oral Daily  . multivitamin  1 tablet Oral Daily  . pantoprazole (PROTONIX) IV  40 mg Intravenous Q24H  . polyethylene glycol  17 g Oral BID  . risperiDONE  0.5 mg Oral QHS  . rOPINIRole  0.25 mg Oral QHS  . sertraline  50 mg Oral Daily  . tamsulosin  0.4 mg Oral QPC supper      Otelia Santee, MD 10/12/2020, 8:42 AM

## 2020-10-13 ENCOUNTER — Other Ambulatory Visit (HOSPITAL_COMMUNITY): Payer: Self-pay

## 2020-10-13 DIAGNOSIS — N401 Enlarged prostate with lower urinary tract symptoms: Secondary | ICD-10-CM | POA: Diagnosis not present

## 2020-10-13 DIAGNOSIS — N138 Other obstructive and reflux uropathy: Secondary | ICD-10-CM | POA: Diagnosis not present

## 2020-10-13 DIAGNOSIS — K529 Noninfective gastroenteritis and colitis, unspecified: Secondary | ICD-10-CM | POA: Diagnosis not present

## 2020-10-13 DIAGNOSIS — I10 Essential (primary) hypertension: Secondary | ICD-10-CM | POA: Diagnosis not present

## 2020-10-13 LAB — GLUCOSE, CAPILLARY
Glucose-Capillary: 100 mg/dL — ABNORMAL HIGH (ref 70–99)
Glucose-Capillary: 116 mg/dL — ABNORMAL HIGH (ref 70–99)
Glucose-Capillary: 86 mg/dL (ref 70–99)

## 2020-10-13 LAB — BODY FLUID CULTURE W GRAM STAIN
Culture: NO GROWTH
Special Requests: NORMAL

## 2020-10-13 MED ORDER — POLYETHYLENE GLYCOL 3350 17 G PO PACK
17.0000 g | PACK | Freq: Every day | ORAL | Status: DC
  Filled 2020-10-13: qty 1

## 2020-10-13 MED ORDER — METRONIDAZOLE 500 MG PO TABS
500.0000 mg | ORAL_TABLET | Freq: Three times a day (TID) | ORAL | 0 refills | Status: AC
  Filled 2020-10-13: qty 14, 5d supply, fill #0

## 2020-10-13 MED ORDER — CEFDINIR 300 MG PO CAPS
300.0000 mg | ORAL_CAPSULE | ORAL | 0 refills | Status: AC
  Filled 2020-10-13: qty 3, 6d supply, fill #0

## 2020-10-13 NOTE — Discharge Instructions (Signed)
[name],  You were in the hospital with evidence of possible colitis (inflammation of your colon). This may be related to infection but also could be related to constipation. You were started on antibiotics and given some medication to have a bowel movement. You have improved with treatment. I recommend continuing your antibiotic and ensuring regular, soft bowel movements.

## 2020-10-13 NOTE — Discharge Summary (Signed)
Physician Discharge Summary  MUHAMED LUECKE YYT:[id number] DOB: 05/30/1950 DOA: 10/08/2020  PCP: Tobe Sos, MD  Admit date: 10/08/2020 Discharge date: 10/13/2020  Admitted From: Home Disposition: Home  Recommendations for Outpatient Follow-up:  1. Follow up with PCP in 1 week 2. Please follow up on the following pending results: None  Home Health: None Equipment/Devices: None  Discharge Condition: Stable CODE STATUS: Full code Diet recommendation: Renal diet   Brief/Interim Summary:  Admission HPI written by Roxan Hockey, MD   HPI: [name] is a 70 y.o. male with a history of diabetes, end-stage renal disease on peritoneal dialysis, COPD, coronary artery disease with history of NSTEMI status post stenting, CHF, hypertension.  Patient seen for severe right-sided abdominal pain that started 2 to 3 days ago.  This pain has been worsening and nonradiating.  Pain is very severe.  He has been intolerant of oral food and liquid and immediately vomits.  He has been able to keep some of his medication down over the past day.  No palliating or provoking factors.   Hospital course:  Colitis Abdominal pain CT abdomen pelvis significant for mild wall thickening of the ascending colon possibly colitis.  C. difficile is negative.  Peritoneal fluid does not suggest SBP.  Patient started empirically on ceftriaxone and Flagyl on admission. Transitioned to Cefdinir and Flagyl to continue treatment for 10 years.  Emesis Improved with treatment of colitis.  Constipation Likely contributing to some of his discomfort.  Given a bowel regimen with bowel movement. Issues with constipation as an outpatient. Resume home  ESRD on PD No SBP suggested on ascites fluid analysis. Nephrology consulted for management.  COPD Currently quit. Wheezing on exam. Continue Pulmicort BID, albuterol prn  Diabetes mellitus, type 2 Well controlled. Most recent hemoglobin A1C of 5.6% on  10/08/20.  CAD s/p PCI History of STEMI Currently asymptomatic. Continue aspirin, Coreg,  Chronic back pain Osteoporosis Continue Enablex  Depression Continue Wellbutrin, Zoloft and risperidone  Infrarenal aortic aneurysm Stable on most recent CT abdomen/pelvis on admission with a maximal dimension of 3.7 cm.  Recommendation for follow-up every 2 years.  Chronic diastolic heart failure Stable. Continue Coreg  Discharge Diagnoses:  Principal Problem:   Colitis Active Problems:   COPD (chronic obstructive pulmonary disease) (HCC)   Diabetes mellitus with renal manifestations, uncontrolled (HCC)   Benign essential HTN   CAD (coronary artery disease)   Diabetes mellitus with nephropathy (HCC)   Chronic diastolic CHF (congestive heart failure) (HCC)   ESRD (end stage renal disease) (HCC)   History of non-ST elevation myocardial infarction (NSTEMI)   Benign prostatic hyperplasia with urinary obstruction    Discharge Instructions   Allergies as of 10/13/2020   No Known Allergies     Medication List    TAKE these medications   albuterol 108 (90 Base) MCG/ACT inhaler Commonly known as: VENTOLIN HFA Inhale 2 puffs into the lungs every 6 (six) hours as needed for wheezing or shortness of breath.   aspirin 81 MG EC tablet Take 1 tablet (81 mg total) by mouth daily.   atorvastatin 40 MG tablet Commonly known as: LIPITOR Take 1 tablet by mouth once daily   beclomethasone 80 MCG/ACT inhaler Commonly known as: QVAR Inhale 2 puffs into the lungs 2 (two) times daily as needed (shortness of breath).   bumetanide 2 MG tablet Commonly known as: BUMEX Take 2 mg by mouth 2 (two) times daily.   buPROPion 150 MG 12 hr tablet Commonly  known as: WELLBUTRIN SR Take 150 mg by mouth 2 (two) times daily.   carvedilol 25 MG tablet Commonly known as: COREG Take 1 tablet (25 mg total) by mouth See admin instructions. Take 25 mg twice daily on Sun, Mon, Wed, and Fri. Take 25 mg  in the evening on Tues, Thurs, and Sat (dialysis days)   cefdinir 300 MG capsule Commonly known as: OMNICEF Take 1 capsule (300 mg total) by mouth every other day for 3 doses.   denosumab 60 MG/ML Sosy injection Commonly known as: PROLIA Inject 60 mg into the skin every 6 (six) months.   lactulose 10 GM/15ML solution Commonly known as: CHRONULAC Take 20 g by mouth 2 (two) times daily as needed for mild constipation.   linaclotide 290 MCG Caps capsule Commonly known as: LINZESS Take 290 mcg by mouth daily as needed (constipation).   magnesium oxide 400 MG tablet Commonly known as: MAG-OX Take 400 mg by mouth daily.   Melatonin 10 MG Tabs Take 10 mg by mouth at bedtime as needed (sleep).   metroNIDAZOLE 500 MG tablet Commonly known as: Flagyl Take 1 tablet (500 mg total) by mouth 3 (three) times daily for 5 days. Notes to patient: First dose this afternoon   multivitamin Tabs tablet Take 1 tablet by mouth daily.   nitrofurantoin (macrocrystal-monohydrate) 100 MG capsule Commonly known as: MACROBID Take 1 capsule (100 mg total) by mouth daily.   omeprazole 20 MG capsule Commonly known as: PRILOSEC Take 20 mg by mouth daily.   ondansetron 4 MG tablet Commonly known as: Zofran Take 1 tablet (4 mg total) by mouth every 8 (eight) hours as needed for nausea or vomiting.   polyethylene glycol 17 g packet Commonly known as: MIRALAX / GLYCOLAX Take 17 g by mouth daily as needed for mild constipation. Mix in morning coffee   Potassium Chloride ER 20 MEQ Tbcr Take 1 tablet by mouth daily.   risperiDONE 0.5 MG tablet Commonly known as: RISPERDAL Take 0.5 mg by mouth at bedtime.   rOPINIRole 0.25 MG tablet Commonly known as: REQUIP Take 0.25 mg by mouth 2 (two) times daily.   Sennosides 25 MG Tabs Take 50 mg by mouth daily as needed (constipation).   sertraline 50 MG tablet Commonly known as: ZOLOFT Take 1 tablet by mouth daily.   solifenacin 5 MG tablet Commonly  known as: VESICARE Take 1 tablet (5 mg total) by mouth daily.   tadalafil 20 MG tablet Commonly known as: CIALIS Take 1 tablet (20 mg total) by mouth daily as needed.   tamsulosin 0.4 MG Caps capsule Commonly known as: FLOMAX Take 1 capsule (0.4 mg total) by mouth daily after supper.   tiZANidine 2 MG tablet Commonly known as: ZANAFLEX Take 2 mg by mouth at bedtime.   traMADol 50 MG tablet Commonly known as: ULTRAM Take by mouth every 6 (six) hours as needed.   Vitamin D3 50 MCG (2000 UT) Tabs Take 2,000 Units by mouth daily.       Follow-up Information    Pradhan, Tilden Fossa, MD. Schedule an appointment as soon as possible for a visit in 1 week(s).   Specialty: Internal Medicine Why: Hospital follow-up Contact information: [address] [place] [address] [phone]              No Known Allergies  Consultations:  None   Procedures/Studies: CT ABDOMEN PELVIS WO CONTRAST  Result Date: 10/08/2020 CLINICAL DATA:  Acute abdominal pain. Nausea and vomiting. Patient reports right  lower abdominal pain for 3 days. EXAM: CT ABDOMEN AND PELVIS WITHOUT CONTRAST TECHNIQUE: Multidetector CT imaging of the abdomen and pelvis was performed following the standard protocol without IV contrast. COMPARISON:  CT 07/28/2019 FINDINGS: Lower chest: Trace right pleural effusion with streaky and patchy right basilar airspace disease. Normal heart size. Coronary artery calcifications. Pacemaker partially visualized. No pericardial effusion. Hepatobiliary: No evidence of focal liver abnormality. Slight nodular contours again seen. Cholecystectomy. No biliary dilatation. Pancreas: Parenchymal atrophy. No ductal dilatation or inflammation. Spleen: Normal in size without focal abnormality. Adrenals/Urinary Tract: Minimal left adrenal thickening. No adrenal nodule bilaterally. Bilateral renal parenchymal thinning. No hydronephrosis. Vascular calcifications, no renal calculi. Exophytic 14 mm  slightly hyperdense lesion from the upper right kidney is not significantly changed from prior. Partially distended urinary bladder. Stomach/Bowel: Mild distal esophageal wall thickening. Unremarkable unenhanced stomach. Occasional fluid-filled small bowel without obstruction or inflammation. Appendix not visualized, appendectomy per history. Mild wall thickening of the ascending colon. Mixed liquid and solid stool in the transverse colon. Formed stool distally. No pericolonic inflammation. Vascular/Lymphatic: Advanced aortic and branch atherosclerosis. Infrarenal aortic aneurysm with maximal dimension 3.7 cm, unchanged. No periaortic stranding. Mild right common iliac aneurysmal dilatation at 2 cm. No bulky abdominopelvic adenopathy. Reproductive: Prostate is unremarkable. Other: Small volume abdominopelvic ascites. Peritoneal dialysis catheter enters right lower abdomen, courses into the pelvis with tip in the right lower quadrant. There is no adjacent fluid collection or inflammation. No free air. No abdominal wall hernia. Musculoskeletal: L2 compression fracture with vertebral augmentation, stable from October 2021 lumbar spine CT. Postsurgical change in the lower lumbar spine. Bones diffusely under mineralized. Surgical hardware in the left proximal femur. There are no acute or suspicious osseous abnormalities. IMPRESSION: 1. Mild wall thickening of the ascending colon, can be seen with colitis. 2. Peritoneal dialysis catheter with tip in the right lower quadrant. No adjacent fluid collection or inflammation. Small volume abdominopelvic ascites. 3. Trace right pleural effusion with streaky and patchy right basilar airspace disease, favor atelectasis over pneumonia. 4. Nodular hepatic contours suggesting cirrhosis. Small volume abdominopelvic ascites. 5. Stable infrarenal aortic aneurysm, maximal dimension 3.7 cm. Recommend follow-up every 2 years. This recommendation follows ACR consensus guidelines: White  Paper of the ACR Incidental Findings Committee II on Vascular Findings. J Am Coll Radiol 2013; 10:789-794. Aortic Atherosclerosis (ICD10-I70.0). Electronically Signed   By: Keith Rake M.D.   On: 10/08/2020 17:36   US RENAL  Result Date: 09/18/2020 CLINICAL DATA:  Recurrent urinary tract infection. EXAM: RENAL / URINARY TRACT ULTRASOUND COMPLETE COMPARISON:  Renal ultrasound 04/05/2016.  Abdominal CT 07/28/2019. FINDINGS: Right Kidney: Renal measurements: 10.3 x 5.1 x 4.0 cm = volume: 109.8 mL. Mild renal cortical thinning and increased echogenicity. There is a cystic lesion in the upper pole which measures up to 1.8 cm. Prominent renal sinus fat. No hydronephrosis or suspicious cortical lesion. Left Kidney: Renal measurements: 10.5 x 6.2 x 4.2 cm = volume: 140.9 mL. Mild renal cortical thinning and increased echogenicity. Prominent renal sinus fat. No hydronephrosis or focal cortical lesion. Bladder: Not visualized. Other: Study is mildly limited by body habitus.  No evidence of ascites. IMPRESSION: 1. Both kidneys demonstrate mild cortical thinning and increased cortical echogenicity consistent with "chronic medical renal disease". 2. Small cyst in the upper pole of the right kidney. No hydronephrosis. 3. The bladder was not visualized. Electronically Signed   By: Richardean Sale M.D.   On: 09/18/2020 12:11      Subjective: No issues today. Feels well. Multiple  bowel movements. Abdominal pain is improved.  Discharge Exam: Vitals:   10/13/20 0811 10/13/20 0814  BP:  (!) 147/74  Pulse:  75  Resp:  18  Temp:  98.4 F (36.9 C)  SpO2: 98% 98%   Vitals:   10/13/20 0559 10/13/20 0801 10/13/20 0811 10/13/20 0814  BP: 131/66   (!) 147/74  Pulse: 67   75  Resp: 18   18  Temp: 98 F (36.7 C)   98.4 F (36.9 C)  TempSrc: Oral Oral  Oral  SpO2: 98%  98% 98%  Weight:    92.4 kg  Height:        General: Pt is alert, awake, not in acute distress Cardiovascular: RRR, S1/S2 +, no rubs, no  gallops Respiratory: CTA bilaterally, no wheezing, no rhonchi Abdominal: Soft, NT, ND, bowel sounds + Extremities: no edema, no cyanosis    The results of significant diagnostics from this hospitalization (including imaging, microbiology, ancillary and laboratory) are listed below for reference.     Microbiology: Recent Results (from the past 240 hour(s))  SARS CORONAVIRUS 2 (TAT 6-24 HRS) Nasopharyngeal Nasopharyngeal Swab     Status: None   Collection Time: 10/08/20  7:37 PM   Specimen: Nasopharyngeal Swab  Result Value Ref Range Status   SARS Coronavirus 2 NEGATIVE NEGATIVE Final    Comment: (NOTE) SARS-CoV-2 target nucleic acids are NOT DETECTED.  The SARS-CoV-2 RNA is generally detectable in upper and lower respiratory specimens during the acute phase of infection. Negative results do not preclude SARS-CoV-2 infection, do not rule out co-infections with other pathogens, and should not be used as the sole basis for treatment or other patient management decisions. Negative results must be combined with clinical observations, patient history, and epidemiological information. The expected result is Negative.  Fact Sheet for Patients: SugarRoll.be  Fact Sheet for Healthcare Providers: https://www.woods-mathews.com/  This test is not yet approved or cleared by the [place] FDA and  has been authorized for detection and/or diagnosis of SARS-CoV-2 by FDA under an Emergency Use Authorization (EUA). This EUA will remain  in effect (meaning this test can be used) for the duration of the COVID-19 declaration under Se ction 564(b)(1) of the Act, 21 U.S.C. section 360bbb-3(b)(1), unless the authorization is terminated or revoked sooner.  Performed at [facility], [place] [address], [place], [place] [address]   Body fluid culture w Gram Stain     Status: None   Collection Time: 10/09/20  6:09 PM   Specimen: Body Fluid  Result  Value Ref Range Status   Specimen Description FLUID PERITONEAL  Final   Special Requests Normal  Final   Gram Stain   Final    FEW WBC SEEN BOTH PMN AND MONONUCLEAR NO ORGANISMS SEEN    Culture   Final    NO GROWTH Performed at [facility], [address], [place], [place] [address]    Report Status 10/13/2020 FINAL  Final     Labs: BNP (last 3 results) No results for input(s): BNP in the last 8760 hours. Basic Metabolic Panel: Recent Labs  Lab 10/08/20 1725 10/10/20 0837  NA 132* 133*  K 3.7 3.5  CL 89* 95*  CO2 33* 27  GLUCOSE 91 102*  BUN 50* 45*  CREATININE 6.48* 6.39*  CALCIUM 10.6* 9.2  PHOS  --  4.0   Liver Function Tests: Recent Labs  Lab 10/08/20 1725 10/10/20 0837  AST 58*  --   ALT 68*  --  ALKPHOS 69  --   BILITOT 0.7  --   PROT 6.8  --   ALBUMIN 3.0* 2.4*   No results for input(s): LIPASE, AMYLASE in the last 168 hours. Recent Labs  Lab 10/10/20 0237  AMMONIA 14   CBC: Recent Labs  Lab 10/08/20 1725 10/09/20 0433 10/10/20 0837  WBC 7.5 7.4 7.0  NEUTROABS 4.5  --   --   HGB 14.4 12.9* 13.2  HCT 45.1 39.1 40.4  MCV 111.6* 110.1* 108.6*  PLT 160 153 139*   Cardiac Enzymes: No results for input(s): CKTOTAL, CKMB, CKMBINDEX, TROPONINI in the last 168 hours. BNP: Invalid input(s): POCBNP CBG: Recent Labs  Lab 10/12/20 0618 10/12/20 1210 10/12/20 1810 10/13/20 0013 10/13/20 0557  GLUCAP 74 84 109* 116* 86   D-Dimer No results for input(s): DDIMER in the last 72 hours. Hgb A1c No results for input(s): HGBA1C in the last 72 hours. Lipid Profile No results for input(s): CHOL, HDL, LDLCALC, TRIG, CHOLHDL, LDLDIRECT in the last 72 hours. Thyroid function studies No results for input(s): TSH, T4TOTAL, T3FREE, THYROIDAB in the last 72 hours.  Invalid input(s): FREET3 Anemia work up No results for input(s): VITAMINB12, FOLATE, FERRITIN, TIBC, IRON, RETICCTPCT in the last 72 hours. Urinalysis    Component Value Date/Time    COLORURINE YELLOW 07/22/2019 1625   APPEARANCEUR Hazy (A) 09/25/2020 1149   LABSPEC 1.010 07/22/2019 1625   PHURINE 6.0 07/22/2019 1625   GLUCOSEU Negative 09/25/2020 1149   HGBUR NEGATIVE 07/22/2019 1625   BILIRUBINUR Negative 09/25/2020 1149   KETONESUR NEGATIVE 07/22/2019 1625   PROTEINUR 2+ (A) 09/25/2020 1149   PROTEINUR >=300 (A) 07/22/2019 1625   UROBILINOGEN 0.2 09/27/2014 0027   NITRITE Negative 09/25/2020 1149   NITRITE NEGATIVE 07/22/2019 1625   LEUKOCYTESUR 1+ (A) 09/25/2020 1149   LEUKOCYTESUR NEGATIVE 07/22/2019 1625   Sepsis Labs Invalid input(s): PROCALCITONIN,  WBC,  LACTICIDVEN Microbiology Recent Results (from the past 240 hour(s))  SARS CORONAVIRUS 2 (TAT 6-24 HRS) Nasopharyngeal Nasopharyngeal Swab     Status: None   Collection Time: 10/08/20  7:37 PM   Specimen: Nasopharyngeal Swab  Result Value Ref Range Status   SARS Coronavirus 2 NEGATIVE NEGATIVE Final    Comment: (NOTE) SARS-CoV-2 target nucleic acids are NOT DETECTED.  The SARS-CoV-2 RNA is generally detectable in upper and lower respiratory specimens during the acute phase of infection. Negative results do not preclude SARS-CoV-2 infection, do not rule out co-infections with other pathogens, and should not be used as the sole basis for treatment or other patient management decisions. Negative results must be combined with clinical observations, patient history, and epidemiological information. The expected result is Negative.  Fact Sheet for Patients: SugarRoll.be  Fact Sheet for Healthcare Providers: https://www.woods-mathews.com/  This test is not yet approved or cleared by the [place] FDA and  has been authorized for detection and/or diagnosis of SARS-CoV-2 by FDA under an Emergency Use Authorization (EUA). This EUA will remain  in effect (meaning this test can be used) for the duration of the COVID-19 declaration under Se ction 564(b)(1)  of the Act, 21 U.S.C. section 360bbb-3(b)(1), unless the authorization is terminated or revoked sooner.  Performed at [facility], [place] [address], [place], [place] [address]   Body fluid culture w Gram Stain     Status: None   Collection Time: 10/09/20  6:09 PM   Specimen: Body Fluid  Result Value Ref Range Status   Specimen Description FLUID PERITONEAL  Final  Special Requests Normal  Final   Gram Stain   Final    FEW WBC SEEN BOTH PMN AND MONONUCLEAR NO ORGANISMS SEEN    Culture   Final    NO GROWTH Performed at [facility], [address] [place], [place] [address]    Report Status 10/13/2020 FINAL  Final     Time coordinating discharge: 35 minutes  SIGNED:   Cordelia Poche, MD [facility] 10/13/2020, 9:41 AM

## 2020-10-13 NOTE — Progress Notes (Signed)
[facility] Progress Note   70 y.o. male with DM, COPD, CAD,  ESRD recently started on PD a mth ago but does have a patent lt AVF.  He lives in [place]- follows with Candiss Norse with [facility].  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  He p/w  right sided ascending abd pain. Wife states no change in PD fluid color but was noted to slightly confused today. Also on pain meds at home.  CT  showed mild wall thickening fo ascending colon- the working diagnosis is colitis and he was started on cipro and flagyl.     Assessment/ Plan:   1.Renal- ESRD on PD.   Cell count only 44 cells and 2% neutrophils which is not c/w peritonitis from PD infection.   Seen on PD; usually at home 4 exchanges at night with 3L fill and then 5th fill with midday drain. Wife has been using 1.5% recently bec of hypotension and anorexia.  He has completed his cycles and already been disconnected; will speak w/  nurse to see net UF.  Basically even from cycler 5/24 AM, neg  486 ml 5/25 and started  alternating 1.5 with 2.5% Dianeal evening of 5/25 and able to net UF 2.2L, last 24hr net UF pending.  Need daily weights on floor scale with assistance in am after off and detached from cycler.  Will right orders in case he is still here  2. Hypertension/volume  - does not seem overloaded and tolerated incr UF -> actually breathing is more comfortable.   3. Colitis-  WBC is normal  Not peritonitis from PD. CDif is neg. On rocephin and flagyl. 4. Anemia  - not an issue right now 5. Confusion-  Hopefully due to pain meds and will clear   Subjective:   States pain definitely improved and breathing better as well.    Objective:   BP (!) 147/74 (BP Location: Right Arm)   Pulse 75   Temp 98.4 F (36.9 C) (Oral)   Resp 18   Ht 6\' 4"  (1.93 m)   Wt 92.4 kg   SpO2 98%   BMI 24.80 kg/m   Intake/Output Summary (Last 24 hours) at 10/13/2020 0901 Last data filed  at 10/12/2020 1700 Gross per 24 hour  Intake 220 ml  Output 625 ml  Net -405 ml   Weight change: 2.1 kg  Physical Exam: General: alert, very pleasant Neck: no JVD Heart: RRR Lungs: mostly clear Abdomen: PD cath in right lower quad-  No tunnel tenderness-  Soft, very minimal tenderness-  No rebound or guarding Extremities: no edema Skin: warm and dry Neuro: alert Left upper arm BCF-  Patent   Imaging: No results found.  Labs: BMET Recent Labs  Lab 10/08/20 1725 10/10/20 0837  NA 132* 133*  K 3.7 3.5  CL 89* 95*  CO2 33* 27  GLUCOSE 91 102*  BUN 50* 45*  CREATININE 6.48* 6.39*  CALCIUM 10.6* 9.2  PHOS  --  4.0   CBC Recent Labs  Lab 10/08/20 1725 10/09/20 0433 10/10/20 0837  WBC 7.5 7.4 7.0  NEUTROABS 4.5  --   --   HGB 14.4 12.9* 13.2  HCT 45.1 39.1 40.4  MCV 111.6* 110.1* 108.6*  PLT 160 153 139*    Medications:    . aspirin EC  81 mg Oral Q breakfast  . atorvastatin  40 mg Oral Daily  . budesonide (PULMICORT) nebulizer solution  0.25 mg Nebulization BID  . buPROPion  150 mg Oral BID  . carvedilol  25 mg Oral 2 times per day on Sun Mon Wed Fri  . carvedilol  25 mg Oral Once per day on Tue Thu Sat  . Chlorhexidine Gluconate Cloth  6 each Topical Q0600  . darifenacin  7.5 mg Oral Daily  . gentamicin cream  1 application Topical Daily  . heparin injection (subcutaneous)  5,000 Units Subcutaneous Q8H  . insulin aspart  0-9 Units Subcutaneous Q6H  . magnesium oxide  400 mg Oral Daily  . multivitamin  1 tablet Oral Daily  . pantoprazole (PROTONIX) IV  40 mg Intravenous Q24H  . polyethylene glycol  17 g Oral Daily  . risperiDONE  0.5 mg Oral QHS  . rOPINIRole  0.25 mg Oral QHS  . sertraline  50 mg Oral Daily  . tamsulosin  0.4 mg Oral QPC supper      Otelia Santee, MD 10/13/2020, 9:01 AM

## 2020-10-13 NOTE — Plan of Care (Signed)
  Problem: Education: Goal: Knowledge of General Education information will improve Description: Including pain rating scale, medication(s)/side effects and non-pharmacologic comfort measures Outcome: Adequate for Discharge   

## 2020-10-13 NOTE — Progress Notes (Signed)
[name] to be D/C'd per MD order. Discussed with the patient and all questions fully answered. ? VSS, Skin clean, dry and intact without evidence of skin break down, no evidence of skin tears noted. ? IV catheter discontinued intact. Site without signs and symptoms of complications. Dressing and pressure applied. ? An After Visit Summary was printed and given to the patient. Patient informed where to pickup prescriptions. ? D/C education completed with patient/family including follow up instructions, medication list, d/c activities limitations if indicated, with other d/c instructions as indicated by MD - patient able to verbalize understanding, all questions fully answered.  ? Patient instructed to return to ED, call 911, or call MD for any changes in condition.  ? Patient to be escorted via [place], and D/C home via private auto.

## 2020-10-13 NOTE — Progress Notes (Signed)
Discharge instructions (including medications) discussed with and copy provided to patient/caregiver 

## 2020-10-17 ENCOUNTER — Other Ambulatory Visit: Payer: Self-pay

## 2020-10-17 ENCOUNTER — Ambulatory Visit (INDEPENDENT_AMBULATORY_CARE_PROVIDER_SITE_OTHER): Payer: Medicare Other

## 2020-10-17 DIAGNOSIS — R339 Retention of urine, unspecified: Secondary | ICD-10-CM | POA: Diagnosis not present

## 2020-10-17 NOTE — Progress Notes (Signed)
Patient came today after PCP sent over for PVR due to patient "not voiding" patient reports his urine output has decreased. Pt started peritoneal dialysis this March and has noticed decrease in his urine output.   PVR scanned report appox 400. Patient reports he instilled his dialysis solution this am as well= patient cathed to determine if PVR is bladder or dialysis fluid. appox 150cc dark urine returned. Wife opted to keep 56f catheter in place at this time as patient has to do a timed 24 hour voiding record for dailysis purpose. Patient is scheduled for Urolift procedure June 20th as well.

## 2020-10-19 ENCOUNTER — Other Ambulatory Visit: Payer: Self-pay

## 2020-10-19 ENCOUNTER — Encounter (HOSPITAL_BASED_OUTPATIENT_CLINIC_OR_DEPARTMENT_OTHER)
Admission: RE | Admit: 2020-10-19 | Discharge: 2020-10-19 | Disposition: A | Discharge status: Home / Self Care | Payer: Medicare Other | Source: Ambulatory Visit | Attending: Cardiology | Admitting: Cardiology

## 2020-10-19 ENCOUNTER — Ambulatory Visit (HOSPITAL_BASED_OUTPATIENT_CLINIC_OR_DEPARTMENT_OTHER)
Admission: RE | Admit: 2020-10-19 | Discharge: 2020-10-19 | Disposition: A | Discharge status: Home / Self Care | Payer: Medicare Other | Source: Ambulatory Visit | Attending: Cardiology | Admitting: Cardiology

## 2020-10-19 ENCOUNTER — Encounter (HOSPITAL_COMMUNITY)
Admission: RE | Admit: 2020-10-19 | Discharge: 2020-10-19 | Disposition: A | Discharge status: Home / Self Care | Payer: Medicare Other | Source: Ambulatory Visit | Attending: Cardiology | Admitting: Cardiology

## 2020-10-19 DIAGNOSIS — I2510 Atherosclerotic heart disease of native coronary artery without angina pectoris: Secondary | ICD-10-CM | POA: Diagnosis present

## 2020-10-19 DIAGNOSIS — I5032 Chronic diastolic (congestive) heart failure: Secondary | ICD-10-CM

## 2020-10-19 LAB — NM MYOCAR MULTI W/SPECT W/WALL MOTION / EF
LV dias vol: 77 mL (ref 62–150)
LV sys vol: 51 mL
Peak HR: 74 {beats}/min
RATE: 0.3
Rest HR: 61 {beats}/min
SDS: 5
SRS: 2
SSS: 7
TID: 1.16

## 2020-10-19 LAB — ECHOCARDIOGRAM COMPLETE
AR max vel: 2.64 cm2
AV Area VTI: 2.55 cm2
AV Area mean vel: 2.61 cm2
AV Mean grad: 4.7 mmHg
AV Peak grad: 10.8 mmHg
Ao pk vel: 1.64 m/s
Area-P 1/2: 2.54 cm2
S' Lateral: 3.4 cm

## 2020-10-19 MED ORDER — REGADENOSON 0.4 MG/5ML IV SOLN
INTRAVENOUS | Status: AC
  Administered 2020-10-19: 0.4 mg via INTRAVENOUS
  Filled 2020-10-19: qty 5

## 2020-10-19 MED ORDER — TECHNETIUM TC 99M TETROFOSMIN IV KIT
10.0000 | PACK | Freq: Once | INTRAVENOUS | Status: AC | PRN
  Administered 2020-10-19: 10 via INTRAVENOUS

## 2020-10-19 MED ORDER — SODIUM CHLORIDE FLUSH 0.9 % IV SOLN
INTRAVENOUS | Status: AC
  Administered 2020-10-19: 10 mL via INTRAVENOUS
  Filled 2020-10-19: qty 10

## 2020-10-19 MED ORDER — TECHNETIUM TC 99M TETROFOSMIN IV KIT
30.0000 | PACK | Freq: Once | INTRAVENOUS | Status: AC | PRN
  Administered 2020-10-19: 30 via INTRAVENOUS

## 2020-10-19 NOTE — Progress Notes (Signed)
*  PRELIMINARY RESULTS* Echocardiogram 2D Echocardiogram has been performed.  [name] 10/19/2020, 1:14 PM

## 2020-10-20 ENCOUNTER — Encounter: Payer: Medicare Other | Admitting: Internal Medicine

## 2020-10-20 ENCOUNTER — Ambulatory Visit: Payer: Medicare Other | Admitting: Urology

## 2020-10-23 ENCOUNTER — Telehealth: Payer: Self-pay

## 2020-10-23 NOTE — Telephone Encounter (Signed)
Wife called- vesicare is causing severe dry mouth for patient. Wife is asking for an alternative. Message sent to MD

## 2020-10-26 ENCOUNTER — Other Ambulatory Visit: Payer: Self-pay

## 2020-10-26 DIAGNOSIS — N138 Other obstructive and reflux uropathy: Secondary | ICD-10-CM

## 2020-10-26 MED ORDER — TOLTERODINE TARTRATE ER 4 MG PO CP24: 4.0000 mg | ORAL_CAPSULE | Freq: Every day | ORAL | 1 refills | Status: DC

## 2020-10-27 ENCOUNTER — Other Ambulatory Visit: Payer: Self-pay

## 2020-10-27 ENCOUNTER — Ambulatory Visit (INDEPENDENT_AMBULATORY_CARE_PROVIDER_SITE_OTHER): Payer: Medicare Other | Admitting: Internal Medicine

## 2020-10-27 ENCOUNTER — Encounter: Payer: Self-pay | Admitting: Internal Medicine

## 2020-10-27 VITALS — BP 128/64 | HR 60 | Ht 76.0 in | Wt 216.0 lb

## 2020-10-27 DIAGNOSIS — R001 Bradycardia, unspecified: Secondary | ICD-10-CM

## 2020-10-27 DIAGNOSIS — N186 End stage renal disease: Secondary | ICD-10-CM

## 2020-10-27 DIAGNOSIS — I480 Paroxysmal atrial fibrillation: Secondary | ICD-10-CM

## 2020-10-27 DIAGNOSIS — I495 Sick sinus syndrome: Secondary | ICD-10-CM | POA: Diagnosis not present

## 2020-10-27 DIAGNOSIS — I6523 Occlusion and stenosis of bilateral carotid arteries: Secondary | ICD-10-CM

## 2020-10-27 DIAGNOSIS — Z01818 Encounter for other preprocedural examination: Secondary | ICD-10-CM

## 2020-10-27 NOTE — Progress Notes (Signed)
PCP: Tobe Sos, MD Primary Cardiologist: Dr [name] Primary EP:  Dr Rayann Heman  [name] is a 70 y.o. male who presents today for routine electrophysiology followup.  Since last being seen in our clinic, the patient is chronically ill and has had several hospitalizations.  Not very active. Has PD dialysis at home.  Occasional SOB.  Today, he denies symptoms of palpitations, chest pain, lower extremity edema, dizziness, presyncope, or syncope.  The patient is otherwise without complaint today.   Past Medical History:  Diagnosis Date   AAA (abdominal aortic aneurysm) ([place]) 06/2016   Mild increase in size of 3.4 cm infrarenal abdominal aortic aneurysm   Anemia    Aortic atherosclerosis (HCC)    Asthma    Atrial flutter (HCC)    AVF (arteriovenous fistula) (HCC)    Left forearm   CAD (coronary artery disease)    a. s/p prior stenting of RCA in 1999 b. cath in 2003 showing moderate CAD c. NST in 2016 showing small area of ischemic and low-risk   CHF (congestive heart failure) ([place])    CKD (chronic kidney disease), stage V ([place])    kidney transplant evaluation   COPD (chronic obstructive pulmonary disease) ([place])    with ongoing tobacco use and patient failed sham takes   Diverticulosis 2018   Mild sigmoid colon diverticulosis.   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    Reflux esophagitis    hx   Sleep apnea    PT STATES ONE TEST WAS POSITIVE AND ANOTHER TEST WAS NEGATIVE   Two-vessel coronary artery disease     moderate. by cath in 2003. Status post stenting of the mdi RCA November 1999 normal left ventricular ejection fraction   Wears glasses    Wears hearing aid    B/L   Past Surgical History:  Procedure Laterality Date   A/V FISTULAGRAM Left 04/30/2019   Procedure: A/V FISTULAGRAM;  Surgeon: Elam Dutch, MD;  Location: [facility];  Service: Cardiovascular;  Laterality: Left;   A/V FISTULAGRAM Left 09/22/2019   Procedure: A/V FISTULAGRAM;  Surgeon: Marty Heck, MD;  Location: [facility];  Service: Cardiovascular;  Laterality: Left;   ABDOMINAL SURGERY     APPENDECTOMY     AV FISTULA PLACEMENT Right 03/11/2018   Procedure: CREATION Brachiocephalic Fistula RIGHT ARM;  Surgeon: Rosetta Posner, MD;  Location: [facility] [place];  Service: Vascular;  Laterality: Right;   AV FISTULA PLACEMENT Left 05/06/2019   Procedure: LEFT BRACHIOCEPHALIC ARTERIOVENOUS (AV) FISTULA CREATION;  Surgeon: Marty Heck, MD;  Location: [facility];  Service: Vascular;  Laterality: Left;   BACK SURGERY     x3; cages in patient's back since 2000   BIOPSY  11/12/2018   Procedure: BIOPSY;  Surgeon: Laurence Spates, MD;  Location: [facility];  Service: Endoscopy;;   CARDIAC CATHETERIZATION     stent   CHOLECYSTECTOMY  CLOSED REDUCTION SHOULDER DISLOCATION     COLONOSCOPY W/ BIOPSIES AND POLYPECTOMY     COLONOSCOPY WITH PROPOFOL N/A 11/12/2018   Procedure: COLONOSCOPY WITH PROPOFOL;  Surgeon: Laurence Spates, MD;  Location: [facility];  Service: Endoscopy;  Laterality: N/A;   CORONARY ANGIOPLASTY     diabetic ulcers     DIAGNOSTIC LAPAROSCOPY     DIALYSIS/PERMA CATHETER REMOVAL N/A 09/22/2019   Procedure: DIALYSIS/PERMA CATHETER REMOVAL;  Surgeon: Marty Heck, MD;  Location: [facility];  Service: Cardiovascular;  Laterality: N/A;   ESOPHAGOGASTRODUODENOSCOPY (EGD) WITH PROPOFOL N/A 11/12/2018   Procedure: ESOPHAGOGASTRODUODENOSCOPY (EGD) WITH PROPOFOL;  Surgeon: Laurence Spates, MD;   [facility];  Service: Endoscopy;  Laterality: N/A;   GROIN EXPLORATION     HEMOSTASIS CLIP PLACEMENT  11/12/2018   Procedure: HEMOSTASIS CLIP PLACEMENT;  Surgeon: Laurence Spates, MD;  [facility];  Service: Endoscopy;;   INSERT / REPLACE / [place] (AV) ARTEGRAFT ARM Left 03/18/2018   Procedure: INSERTION OF ARTERIOVENOUS (AV) FISTULA;  Surgeon: Rosetta Posner, MD;  Location: [facility];  Service: Vascular;  Laterality: Left;   IR FLUORO GUIDE CV LINE RIGHT  04/11/2019   IR US GUIDE VASC ACCESS RIGHT  04/11/2019   LEFT HEART CATH AND CORONARY ANGIOGRAPHY N/A 07/23/2019   Procedure: LEFT HEART CATH AND CORONARY ANGIOGRAPHY;  Surgeon: Martinique, Peter M, MD;  Location: [facility];  Service: Cardiovascular;  Laterality: N/A;   LIGATION ARTERIOVENOUS GORTEX GRAFT Right 03/18/2018   Procedure: LIGATION ARTERIOVENOUS FISTULA;  Surgeon: Rosetta Posner, MD;  Location: [facility];  Service: Vascular;  Laterality: Right;   OSTECTOMY Left 04/23/2017   Procedure: OSTECTOMY LEFT GREAT TOE;  Surgeon: [name], DPM;  Location: [facility];  Service: Podiatry;  Laterality: Left;  left great toe   POLYPECTOMY  11/12/2018   Procedure: POLYPECTOMY;  Surgeon: Laurence Spates, MD;  Location: [facility];  Service: Endoscopy;;   TUMOR REMOVAL     small intestine x3   VISCERAL ANGIOGRAPHY N/A 08/02/2019   Procedure: VISCERAL ANGIOGRAPHY;  Surgeon: Waynetta Sandy, MD;  Location: [facility];  Service: Cardiovascular;  Laterality: N/A;    ROS- all systems are reviewed and negative except as per HPI above  Current Outpatient Medications  Medication Sig Dispense Refill   albuterol (VENTOLIN HFA) 108 (90 Base) MCG/ACT inhaler Inhale 2 puffs into the lungs every 6 (six) hours as needed for wheezing or shortness of breath.      aspirin EC 81 MG EC tablet Take 1 tablet (81 mg total) by mouth daily.     atorvastatin (LIPITOR) 40 MG tablet Take 1  tablet by mouth once daily 90 tablet 1   beclomethasone (QVAR) 80 MCG/ACT inhaler Inhale 2 puffs into the lungs 2 (two) times daily as needed (shortness of breath).     bumetanide (BUMEX) 2 MG tablet Take 2 mg by mouth 2 (two) times daily.     buPROPion (WELLBUTRIN SR) 150 MG 12 hr tablet Take 150 mg by mouth 2 (two) times daily.     carvedilol (COREG) 25 MG tablet Take 1 tablet (25 mg total) by mouth See admin instructions. Take 25 mg twice daily on Sun, Mon, Wed, and Fri. Take 25 mg in the evening on Tues, Thurs, and Sat (dialysis days) 180 tablet 2   Cholecalciferol (VITAMIN D3) 2000 units TABS Take 2,000 Units by mouth daily.      denosumab (PROLIA) 60 MG/ML  SOSY injection Inject 60 mg into the skin every 6 (six) months.     lactulose (CHRONULAC) 10 GM/15ML solution Take 20 g by mouth 2 (two) times daily as needed for mild constipation.     linaclotide (LINZESS) 290 MCG CAPS capsule Take 290 mcg by mouth daily as needed (constipation).     magnesium oxide (MAG-OX) 400 MG tablet Take 400 mg by mouth daily.     Melatonin 10 MG TABS Take 10 mg by mouth at bedtime as needed (sleep).      multivitamin (RENA-VIT) TABS tablet Take 1 tablet by mouth daily.     nitrofurantoin, macrocrystal-monohydrate, (MACROBID) 100 MG capsule Take 1 capsule (100 mg total) by mouth daily. 30 capsule 0   omeprazole (PRILOSEC) 20 MG capsule Take 20 mg by mouth daily.      ondansetron (ZOFRAN) 4 MG tablet Take 1 tablet (4 mg total) by mouth every 8 (eight) hours as needed for nausea or vomiting. 20 tablet 0   polyethylene glycol (MIRALAX / GLYCOLAX) packet Take 17 g by mouth daily as needed for mild constipation. Mix in morning coffee     Potassium Chloride ER 20 MEQ TBCR Take 1 tablet by mouth daily.     risperiDONE (RISPERDAL) 0.5 MG tablet Take 0.5 mg by mouth at bedtime.     rOPINIRole (REQUIP) 0.25 MG tablet Take 0.25 mg by mouth 2 (two) times daily.     Sennosides 25 MG TABS Take 50 mg by mouth daily as needed  (constipation).      sertraline (ZOLOFT) 50 MG tablet Take 1 tablet by mouth daily.     tadalafil (CIALIS) 20 MG tablet Take 1 tablet (20 mg total) by mouth daily as needed. 10 tablet 5   tamsulosin (FLOMAX) 0.4 MG CAPS capsule Take 1 capsule (0.4 mg total) by mouth daily after supper. 90 capsule 3   tiZANidine (ZANAFLEX) 2 MG tablet Take 2 mg by mouth at bedtime.     tolterodine (DETROL LA) 4 MG 24 hr capsule Take 1 capsule (4 mg total) by mouth daily. 30 capsule 1   traMADol (ULTRAM) 50 MG tablet Take by mouth every 6 (six) hours as needed.     No current facility-administered medications for this visit.    Physical Exam: Vitals:   10/27/20 1417  BP: 128/64  Pulse: 60  SpO2: 98%  Weight: 216 lb (98 kg)  Height: 6\' 4"  (1.93 m)    GEN- The patient is chronically ill appearing, alert and oriented x 3 today.   Head- normocephalic, atraumatic Eyes-  Sclera clear, conjunctiva pink Ears- hearing intact Oropharynx- clear Lungs-  normal work of breathing Chest- pacemaker pocket is well healed Heart- Regular rate and rhythm  GI- soft,  Extremities- no clubbing, cyanosis, or edema  Echo 10/19/20- EF 60%, moderate LA enlargement,  Pacemaker interrogation- reviewed in detail today,  See PACEART report Myoview 10/19/20- large inferior MI, EF 35%, mild to moderate apical ischemia   Assessment and Plan:  1. Symptomatic sinus bradycardia  Normal pacemaker function See Pace Art report No changes today Chronically elevated RV threshold RV sensing is also low RV paces only 3 % No plans for device revision at this time he is not device dependant today  2. Paroxysmal atrial fibrillation Low AF burden Chads2vasc score is 3.  Per guidelines he should be on [place] or have LAAO performed. He is felt to be too high risk for [facility] currently. We have again watchman given risks of anemia, prior  GI bleeding, and ESRD. We have discussed their bleeding risk in the context of their comorbid medical  problems, as well as the rationale for referral for evaluation of Watchman left atrial appendage occlusion therapy. While the patient is at high long-term bleeding risk, they may be appropriate for short-term anticoagulation. Based on this individual patient's stroke and bleeding risk, a shared decision has been made to refer the patient for consideration of Watchman left atrial appendage closure utilizing the [employer] of Cardiology shared decision tool.  3. CAD No ischemic symptoms Myoview reviewed.  Consistent with prior myoview findings as well as cath from 2021.  Continue current medical management  4. HL Stable No change required today  5. ESRD On HD  6. Preop Ok to proceed with surgery if medically indicated though he would be at least moderate risk for any procedures.   Risks, benefits and potential toxicities for medications prescribed and/or refilled reviewed with patient today.   Thompson Grayer MD, [facility] 10/27/2020 2:19 PM

## 2020-10-27 NOTE — Patient Instructions (Signed)
Medication Instructions:  Continue all current medications.  Labwork: none  Testing/Procedures: none  Follow-Up: 1 year   Any Other Special Instructions Will Be Listed Below (If Applicable).  If you need a refill on your cardiac medications before your next appointment, please call your pharmacy.  

## 2020-10-30 ENCOUNTER — Telehealth: Payer: Self-pay | Admitting: *Deleted

## 2020-10-30 NOTE — Telephone Encounter (Signed)
Laurine Blazer, LPN  1/96/9409 82:86 AM EDT Back to Top     Notified, copy to pcp.  Copy also forwarded to his transplant team - Sherrie George - fax:  [phone].

## 2020-10-30 NOTE — Telephone Encounter (Signed)
-----   Message from Arnoldo Lenis, MD sent at 10/30/2020 10:56 AM EDT ----- Stress test shows evidence of old damage on the bottom of the heart that we have seen before. Overall mild area of blockage at the very tip of the heart also similar to prior findings. Low EF by nuclear study but nuclear studies are poor measures of this, his echo shows his heart pumping function is actually normal. Please forward results to his transplant team  Zandra Abts MD

## 2020-11-02 ENCOUNTER — Encounter (HOSPITAL_COMMUNITY): Payer: Self-pay

## 2020-11-02 ENCOUNTER — Encounter (HOSPITAL_COMMUNITY)
Admission: RE | Admit: 2020-11-02 | Discharge: 2020-11-02 | Disposition: A | Discharge status: Home / Self Care | Payer: Medicare Other | Source: Ambulatory Visit | Attending: Urology | Admitting: Urology

## 2020-11-02 ENCOUNTER — Other Ambulatory Visit: Payer: Self-pay

## 2020-11-02 HISTORY — DX: Unspecified osteoarthritis, unspecified site: M19.90

## 2020-11-02 HISTORY — DX: Age-related osteoporosis without current pathological fracture: M81.0

## 2020-11-03 ENCOUNTER — Encounter (HOSPITAL_COMMUNITY): Admission: RE | Admit: 2020-11-03 | Payer: Medicare Other | Source: Ambulatory Visit

## 2020-11-03 ENCOUNTER — Other Ambulatory Visit (HOSPITAL_COMMUNITY): Payer: Medicare Other

## 2020-11-06 ENCOUNTER — Ambulatory Visit (HOSPITAL_COMMUNITY): Payer: Medicare Other | Admitting: Anesthesiology

## 2020-11-06 ENCOUNTER — Encounter (HOSPITAL_COMMUNITY): Payer: Self-pay | Admitting: Urology

## 2020-11-06 ENCOUNTER — Ambulatory Visit (HOSPITAL_COMMUNITY)
Admission: RE | Admit: 2020-11-06 | Discharge: 2020-11-06 | Disposition: A | Discharge status: Home / Self Care | Payer: Medicare Other | Attending: Urology | Admitting: Urology

## 2020-11-06 ENCOUNTER — Encounter (HOSPITAL_COMMUNITY)
Admission: RE | Disposition: A | Discharge status: Home / Self Care | Payer: Self-pay | Source: Home / Self Care | Attending: Urology

## 2020-11-06 DIAGNOSIS — Z87891 Personal history of nicotine dependence: Secondary | ICD-10-CM | POA: Insufficient documentation

## 2020-11-06 DIAGNOSIS — N401 Enlarged prostate with lower urinary tract symptoms: Secondary | ICD-10-CM | POA: Insufficient documentation

## 2020-11-06 DIAGNOSIS — N138 Other obstructive and reflux uropathy: Secondary | ICD-10-CM | POA: Diagnosis not present

## 2020-11-06 DIAGNOSIS — N210 Calculus in bladder: Secondary | ICD-10-CM | POA: Insufficient documentation

## 2020-11-06 DIAGNOSIS — Z888 Allergy status to other drugs, medicaments and biological substances status: Secondary | ICD-10-CM | POA: Diagnosis not present

## 2020-11-06 DIAGNOSIS — Z885 Allergy status to narcotic agent status: Secondary | ICD-10-CM | POA: Diagnosis not present

## 2020-11-06 DIAGNOSIS — R338 Other retention of urine: Secondary | ICD-10-CM | POA: Diagnosis not present

## 2020-11-06 HISTORY — PX: CYSTOSCOPY WITH INSERTION OF UROLIFT: SHX6678

## 2020-11-06 LAB — POCT I-STAT, CHEM 8
BUN: 43 mg/dL — ABNORMAL HIGH (ref 8–23)
Calcium, Ion: 1.08 mmol/L — ABNORMAL LOW (ref 1.15–1.40)
Chloride: 95 mmol/L — ABNORMAL LOW (ref 98–111)
Creatinine, Ser: 5 mg/dL — ABNORMAL HIGH (ref 0.61–1.24)
Glucose, Bld: 87 mg/dL (ref 70–99)
HCT: 43 % (ref 39.0–52.0)
Hemoglobin: 14.6 g/dL (ref 13.0–17.0)
Potassium: 4.3 mmol/L (ref 3.5–5.1)
Sodium: 136 mmol/L (ref 135–145)
TCO2: 32 mmol/L (ref 22–32)

## 2020-11-06 SURGERY — CYSTOSCOPY WITH INSERTION OF UROLIFT
Anesthesia: General

## 2020-11-06 MED ORDER — LIDOCAINE HCL (CARDIAC) PF 100 MG/5ML IV SOSY
PREFILLED_SYRINGE | INTRAVENOUS | Status: DC | PRN
  Administered 2020-11-06: 60 mg via INTRATRACHEAL

## 2020-11-06 MED ORDER — CHLORHEXIDINE GLUCONATE 0.12 % MT SOLN
15.0000 mL | Freq: Once | OROMUCOSAL | Status: AC
  Administered 2020-11-06: 15 mL via OROMUCOSAL

## 2020-11-06 MED ORDER — PROPOFOL 10 MG/ML IV BOLUS
INTRAVENOUS | Status: DC | PRN
  Administered 2020-11-06: 120 mg via INTRAVENOUS

## 2020-11-06 MED ORDER — CEFTRIAXONE SODIUM 2 G IJ SOLR
2.0000 g | INTRAMUSCULAR | Status: AC
  Administered 2020-11-06: 2 g via INTRAVENOUS

## 2020-11-06 MED ORDER — FENTANYL CITRATE (PF) 100 MCG/2ML IJ SOLN
INTRAMUSCULAR | Status: DC | PRN
  Administered 2020-11-06 (×4): 25 ug via INTRAVENOUS

## 2020-11-06 MED ORDER — ONDANSETRON HCL 4 MG/2ML IJ SOLN
4.0000 mg | Freq: Once | INTRAMUSCULAR | Status: DC | PRN
Start: 2020-11-06 — End: 2020-11-06

## 2020-11-06 MED ORDER — SODIUM CHLORIDE 0.9 % IV SOLN
INTRAVENOUS | Status: AC
  Filled 2020-11-06: qty 20

## 2020-11-06 MED ORDER — EPHEDRINE SULFATE 50 MG/ML IJ SOLN
INTRAMUSCULAR | Status: DC | PRN
  Administered 2020-11-06 (×3): 10 mg via INTRAVENOUS

## 2020-11-06 MED ORDER — DEXAMETHASONE SODIUM PHOSPHATE 10 MG/ML IJ SOLN
INTRAMUSCULAR | Status: AC
  Filled 2020-11-06: qty 1

## 2020-11-06 MED ORDER — FENTANYL CITRATE (PF) 100 MCG/2ML IJ SOLN
INTRAMUSCULAR | Status: AC
  Filled 2020-11-06: qty 2

## 2020-11-06 MED ORDER — LIDOCAINE HCL (PF) 2 % IJ SOLN
INTRAMUSCULAR | Status: AC
  Filled 2020-11-06: qty 5

## 2020-11-06 MED ORDER — ONDANSETRON HCL 4 MG/2ML IJ SOLN
INTRAMUSCULAR | Status: AC
  Filled 2020-11-06: qty 2

## 2020-11-06 MED ORDER — ONDANSETRON HCL 4 MG/2ML IJ SOLN
INTRAMUSCULAR | Status: DC | PRN
  Administered 2020-11-06: 4 mg via INTRAVENOUS

## 2020-11-06 MED ORDER — LACTATED RINGERS IV SOLN: INTRAVENOUS | Status: DC | PRN

## 2020-11-06 MED ORDER — TRAMADOL HCL 50 MG PO TABS: 50.0000 mg | ORAL_TABLET | Freq: Four times a day (QID) | ORAL | 0 refills | Status: DC | PRN

## 2020-11-06 MED ORDER — SODIUM CHLORIDE 0.9 % IR SOLN
Status: DC | PRN
  Administered 2020-11-06 (×2): 3000 mL

## 2020-11-06 MED ORDER — FENTANYL CITRATE (PF) 100 MCG/2ML IJ SOLN: 25.0000 ug | INTRAMUSCULAR | Status: DC | PRN

## 2020-11-06 MED ORDER — DEXAMETHASONE SODIUM PHOSPHATE 10 MG/ML IJ SOLN
INTRAMUSCULAR | Status: DC | PRN
  Administered 2020-11-06: 5 mg via INTRAVENOUS

## 2020-11-06 MED ORDER — ORAL CARE MOUTH RINSE: 15.0000 mL | Freq: Once | OROMUCOSAL | Status: AC

## 2020-11-06 MED ORDER — SODIUM CHLORIDE 0.9 % IV SOLN: INTRAVENOUS | Status: DC

## 2020-11-06 SURGICAL SUPPLY — 18 items
BAG DRAIN URO TABLE W/ADPT NS (BAG) ×2 IMPLANT
BAG DRN 8 ADPR NS SKTRN CSTL (BAG) ×1
BAG HAMPER (MISCELLANEOUS) ×2 IMPLANT
CLOTH BEACON ORANGE TIMEOUT ST (SAFETY) ×2 IMPLANT
GLOVE BIO SURGEON STRL SZ8 (GLOVE) ×2 IMPLANT
GLOVE SURG LTX SZ6.5 (GLOVE) ×2 IMPLANT
GLOVE SURG UNDER POLY LF SZ7 (GLOVE) ×4 IMPLANT
GOWN STRL REUS W/TWL LRG LVL3 (GOWN DISPOSABLE) ×2 IMPLANT
GOWN STRL REUS W/TWL XL LVL3 (GOWN DISPOSABLE) ×2 IMPLANT
IV NS IRRIG 3000ML ARTHROMATIC (IV SOLUTION) ×2 IMPLANT
KIT TURNOVER CYSTO (KITS) ×2 IMPLANT
MANIFOLD NEPTUNE II (INSTRUMENTS) ×2 IMPLANT
PACK CYSTO (CUSTOM PROCEDURE TRAY) ×2 IMPLANT
PAD ARMBOARD 7.5X6 YLW CONV (MISCELLANEOUS) ×2 IMPLANT
SYSTEM UROLIFT (Male Continence) ×4 IMPLANT
TOWEL OR 17X26 4PK STRL BLUE (TOWEL DISPOSABLE) ×2 IMPLANT
TRAY FOLEY W/BAG SLVR 16FR (SET/KITS/TRAYS/PACK) ×2
TRAY FOLEY W/BAG SLVR 16FR ST (SET/KITS/TRAYS/PACK) ×1 IMPLANT

## 2020-11-06 NOTE — Op Note (Signed)
   PREOPERATIVE DIAGNOSIS:  Benign prostatic hypertrophy with bladder outlet obstruction.  POSTOPERATIVE DIAGNOSIS:  Benign prostatic hypertrophy with bladder outlet obstruction.  PROCEDURE:  Cystoscopy with implantation of UroLift devices, 4 implants.  SURGEON:  Nicolette Bang, M.D.  ANESTHESIA:  General  ANTIBIOTICS: ancef   SPECIMEN:  None.  DRAINS:  A 16-French Foley catheter.  BLOOD LOSS:  Minimal.  COMPLICATIONS:  None.  INDICATIONS: The Patient is an 70 year old male with BPH and bladder outlet obstruction.  He has failed medical therapy and has elected UroLift for definitive treatment.  FINDINGS OF PROCEDURE:  He was taken to the operating room where a genral anesthetic was induced.  He was placed in lithotomy position and was fitted with PAS hose.  His perineum and genitalia were prepped with chlorhexidine, and he was draped in usual sterile fashion.  Cystoscopy was performed using the UroLift scope and 0 degree lens. Examination revealed a normal urethra.  The external sphincter was intact.  Prostatic urethra was approximately 4 cm in length with lateral lobe enlargement. There was also little bit of bladder neck elevation. Inspection of bladder revealed mild-to-moderate trabeculation with no tumors, stones, or inflammation.  No cellules or diverticula were noted. Ureteral orifices were in their normal anatomic position effluxing clear urine.  After initial cystoscopy, the visual obturator was replaced with the first UroLift device.  This was turned to the 9 o'clock position and pulled back to the veru and then slightly advanced.  Pressure was then applied to the right lateral lobe and the UroLift device was deployed.  The second UroLift device was then inserted and applied to the left lateral lobe at 3 o'clock and deployed in the mid prostatic urethra. After this, there was still some apparent obstruction closer to the bladder neck.  So a second level  of UroLift  device was applied between the mid urethra and the proximal urethra providing further patency to the prostatic urethra.  At this point, a Foley catheter was indicated.  The scope was removed and a 16-French Foley catheter was inserted without difficulty. The balloon was filled with 10 mL sterile fluid, and the catheter was placed to straight drainage.  COMPLICATIONS: None   CONDITION: Stable, extubated, transferred to PACU  PLAN: The patient will be discharged home and followup in 2 days for a voiding trial.

## 2020-11-06 NOTE — Transfer of Care (Signed)
Immediate Anesthesia Transfer of Care Note  Patient: [name]  Procedure(s) Performed: CYSTOSCOPY WITH INSERTION OF UROLIFT  Patient Location: PACU  Anesthesia Type:General  Level of Consciousness: drowsy  Airway & Oxygen Therapy: Patient Spontanous Breathing and Patient connected to nasal cannula oxygen  Post-op Assessment: Report given to RN and Post -op Vital signs reviewed and stable  Post vital signs: Reviewed and stable  Last Vitals:  Vitals Value Taken Time  BP 105/58 11/06/20 1120  Temp    Pulse 59 11/06/20 1121  Resp 13 11/06/20 1121  SpO2 97 % 11/06/20 1121  Vitals shown include unvalidated device data.  Last Pain:  Vitals:   11/06/20 0922  TempSrc: Oral  PainSc: 0-No pain      Patients Stated Pain Goal: 9 (99/83/38 2505)  Complications: No notable events documented.

## 2020-11-06 NOTE — Anesthesia Preprocedure Evaluation (Signed)
Anesthesia Evaluation  Patient identified by MRN, date of birth, ID band Patient awake    Reviewed: Allergy & Precautions, NPO status , Patient's Chart, lab work & pertinent test results  History of Anesthesia Complications Negative for: history of anesthetic complications  Airway Mallampati: II  TM Distance: >3 FB Neck ROM: Full    Dental  (+) Dental Advisory Given, Caps   Pulmonary shortness of breath and with exertion, asthma , sleep apnea , pneumonia, resolved, COPD, Patient abstained from smoking., former smoker,    Pulmonary exam normal breath sounds clear to auscultation       Cardiovascular Exercise Tolerance: Poor hypertension, Pt. on medications + CAD, + Cardiac Stents, + Peripheral Vascular Disease (AAA), +CHF and + Orthopnea  Normal cardiovascular exam+ dysrhythmias Atrial Fibrillation + pacemaker  Rhythm:Regular Rate:Normal  2021- cath 1. Severe 2 vessel obstructive CAD. The RCA and LAD disease is stable compared to 2003. There is progressive disease in a large diagonal branch and the LCx    - the diagonal is a large bifurcating vessel.  It has multiple severe stenoses in the main branch with severely calcified lesions    - 75% proximal LCx and 90% mid LCx. There is an acutely angulated take off from the left main and the vessel is severely calcified. 2. Normal LV function 3. Normal LVEDP  Plan: discussed with Dr Burt Knack. The diagonal is poorly suited for PCI due to diffuse and heavily calcified disease. The LCx is complex and would require atherectomy. The acute angulation would make it technically difficult. For now would recommend aggressive medical therapy    Neuro/Psych  Headaches, PSYCHIATRIC DISORDERS Anxiety    GI/Hepatic Neg liver ROS, GERD  Medicated,  Endo/Other  diabetes, Well Controlled, Type 2, Oral Hypoglycemic Agents  Renal/GU ESRF and DialysisRenal disease     Musculoskeletal  (+) Arthritis ,    Abdominal   Peds  Hematology  (+) anemia ,   Anesthesia Other Findings Assessment and Plan:  1. Symptomatic sinus bradycardia Normal pacemaker function See Pace Art report No changes today Chronically elevated RV threshold RV sensing is also low RV paces only 3 % No plans for device revision at this time he is not device dependant today  2. Paroxysmal atrial fibrillation Low AF burden Chads2vasc score is 3.  Per guidelines he should be on [place] or have LAAO performed. He is felt to be too high risk for [facility] currently. We have again watchman given risks of anemia, prior GI bleeding, and ESRD. We have discussed their bleeding risk in the context of their comorbid medical problems, as well as the rationale for referral for evaluation of Watchman left atrial appendage occlusion therapy. While the patient is at high long-term bleeding risk, they may be appropriate for short-term anticoagulation. Based on this individual patient's stroke and bleeding risk, a shared decision has been made to refer the patient for consideration of Watchman left atrial appendage closure utilizing the [employer] of Cardiology shared decision tool.  3. CAD No ischemic symptoms Myoview reviewed.  Consistent with prior myoview findings as well as cath from 2021.  Continue current medical management  4. HL Stable No change required today  5. ESRD On HD  6. Preop Ok to proceed with surgery if medically indicated though he would be at least moderate risk for any procedures.   Risks, benefits and potential toxicities for medications prescribed and/or refilled reviewed with patient today.  Thompson Grayer MD, [facility] 10/27/2020 2:19 PM  Reproductive/Obstetrics  Anesthesia Physical Anesthesia Plan  ASA: 4  Anesthesia Plan: General   Post-op Pain Management:    Induction: Intravenous  PONV Risk Score and Plan: 4 or greater and  Ondansetron and Midazolam  Airway Management Planned: LMA  Additional Equipment:   Intra-op Plan:   Post-operative Plan: Extubation in OR  Informed Consent: I have reviewed the patients History and Physical, chart, labs and discussed the procedure including the risks, benefits and alternatives for the proposed anesthesia with the patient or authorized representative who has indicated his/her understanding and acceptance.     Dental advisory given  Plan Discussed with: CRNA and Surgeon  Anesthesia Plan Comments:        Anesthesia Quick Evaluation

## 2020-11-06 NOTE — H&P (Signed)
Urology Admission H&P  Chief Complaint: urinary urgency, incompelte emptying  History of Present Illness: [name] is a 70yo with a history fo BPH and incomplete emptying. He has failed alpha blocker therapy. He has a 1cm bladder calculus found on office cystoscopy.  Past Medical History:  Diagnosis Date   AAA (abdominal aortic aneurysm) ([place]) 06/2016   Mild increase in size of 3.4 cm infrarenal abdominal aortic aneurysm   Anemia    Aortic atherosclerosis (H[facility])    Arthritis    Asthma    Atrial flutter (HCC)    AVF (arteriovenous fistula) (HCC)    Left forearm   CAD (coronary artery disease)    a. s/p prior stenting of RCA in 1999 b. cath in 2003 showing moderate CAD c. NST in 2016 showing small area of ischemic and low-risk   CHF (congestive heart failure) ([place])    CKD (chronic kidney disease), stage V ([place])    kidney transplant evaluation   COPD (chronic obstructive pulmonary disease) ([place])    with ongoing tobacco use and patient failed sham takes   Diverticulosis 2018   Mild sigmoid colon diverticulosis.   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    Reflux esophagitis    hx   Two-vessel coronary artery disease    moderate. by cath in 2003. Status post stenting of the mdi RCA November 1999 normal left ventricular ejection fraction   Wears glasses    Wears hearing aid    B/L   Past Surgical History:  Procedure Laterality Date   A/V FISTULAGRAM Left 04/30/2019   Procedure: A/V FISTULAGRAM;  Surgeon:  Elam Dutch, MD;  Location: [place] [facility];  Service: Cardiovascular;  Laterality: Left;   A/V FISTULAGRAM Left 09/22/2019   Procedure: A/V FISTULAGRAM;  Surgeon: Marty Heck, MD;  Location: [place] [facility];  Service: Cardiovascular;  Laterality: Left;   ABDOMINAL SURGERY     APPENDECTOMY     AV FISTULA PLACEMENT Right 03/11/2018   Procedure: CREATION Brachiocephalic Fistula RIGHT ARM;  Surgeon: Rosetta Posner, MD;  Location: [place];  Service: Vascular;  Laterality: Right;   AV FISTULA PLACEMENT Left 05/06/2019   Procedure: LEFT BRACHIOCEPHALIC ARTERIOVENOUS (AV) FISTULA CREATION;  Surgeon: Marty Heck, MD;  Location: [place];  Service: Vascular;  Laterality: Left;   BACK SURGERY     x3; cages in patient's back since 2000   BIOPSY  11/12/2018   Procedure: BIOPSY;  Surgeon: Laurence Spates, MD;  Location: [facility];  Service: Endoscopy;;   CARDIAC CATHETERIZATION     stent   CHOLECYSTECTOMY     CLOSED REDUCTION SHOULDER DISLOCATION     COLONOSCOPY W/ BIOPSIES AND POLYPECTOMY     COLONOSCOPY WITH PROPOFOL N/A 11/12/2018   Procedure: COLONOSCOPY WITH PROPOFOL;  Surgeon: Laurence Spates, MD;  Location: [facility];  Service: Endoscopy;  Laterality: N/A;   CORONARY ANGIOPLASTY     diabetic  ulcers     DIAGNOSTIC LAPAROSCOPY     DIALYSIS/PERMA CATHETER REMOVAL N/A 09/22/2019   Procedure: DIALYSIS/PERMA CATHETER REMOVAL;  Surgeon: Marty Heck, MD;  Location: [place] [facility];  Service: Cardiovascular;  Laterality: N/A;   ESOPHAGOGASTRODUODENOSCOPY (EGD) WITH PROPOFOL N/A 11/12/2018   Procedure: ESOPHAGOGASTRODUODENOSCOPY (EGD) WITH PROPOFOL;  Surgeon: Laurence Spates, MD;  [facility];  Service: Endoscopy;  Laterality: N/A;   GROIN EXPLORATION     HEMOSTASIS CLIP PLACEMENT  11/12/2018   Procedure: HEMOSTASIS CLIP PLACEMENT;  Surgeon: Laurence Spates, MD;  Location: [facility];  Service: Endoscopy;;   HIP ARTHROPLASTY Left    gamma nail    INSERT / REPLACE / [place] (AV) ARTEGRAFT ARM Left 03/18/2018   Procedure: INSERTION OF ARTERIOVENOUS (AV) FISTULA;  Surgeon: Rosetta Posner, MD;  Location: [place];  Service: Vascular;  Laterality: Left;   IR FLUORO GUIDE CV LINE RIGHT  04/11/2019   IR US GUIDE VASC ACCESS RIGHT  04/11/2019   LEFT HEART CATH AND CORONARY ANGIOGRAPHY N/A 07/23/2019   Procedure: LEFT HEART CATH AND CORONARY ANGIOGRAPHY;  Surgeon: Martinique, Peter M, MD;  Location: [place] [facility];  Service: Cardiovascular;  Laterality: N/A;   LIGATION ARTERIOVENOUS GORTEX GRAFT Right 03/18/2018   Procedure: LIGATION ARTERIOVENOUS FISTULA;  Surgeon: Rosetta Posner, MD;  Location: [facility];  Service: Vascular;  Laterality: Right;   OSTECTOMY Left 04/23/2017   Procedure: OSTECTOMY LEFT GREAT TOE;  Surgeon: [name], DPM;  Location: [facility];  Service: Podiatry;  Laterality: Left;  left great toe   POLYPECTOMY  11/12/2018   Procedure: POLYPECTOMY;  Surgeon: Laurence Spates, MD;  [facility];  Service: Endoscopy;;   TUMOR REMOVAL     small intestine x3   VISCERAL ANGIOGRAPHY N/A 08/02/2019   Procedure: VISCERAL ANGIOGRAPHY;  Surgeon: Waynetta Sandy, MD;  Location: [place] [facility];  Service: Cardiovascular;  Laterality: N/A;    Home Medications:  Current Facility-Administered Medications  Medication Dose Route Frequency Provider Last Rate Last Admin   0.9 %  sodium chloride infusion   Intravenous Continuous Denese Killings, MD 10 mL/hr at 11/06/20 0924 New Bag at 11/06/20 0924   cefTRIAXone (ROCEPHIN) 2 g in sodium chloride 0.9 % 100 mL IVPB  2 g Intravenous 30 min Pre-Op Laney Bagshaw, Candee Furbish, MD       sodium chloride 0.9 % with cefTRIAXone (ROCEPHIN) ADS Med            Allergies:  Allergies  Allergen Reactions   Other     Opiods cause hallucinations and delusions has to be given risperidone to settle down   Requip [Ropinirole] Other (See Comments)    Dry  mouth    Family History  Problem Relation Age of Onset   Diabetes Mother    Alcohol abuse Father    Social History:  reports that he quit smoking about 11 months ago. His smoking use included cigarettes. He started smoking about 52 years ago. He has never used smokeless tobacco. He reports previous alcohol use. He reports that he does not use drugs.  Review of Systems  Genitourinary:  Positive for difficulty urinating.  All other systems reviewed and are negative.  Physical Exam:  Vital signs in last 24 hours: Temp:  [97.5 F (36.4 C)] 97.5 F (36.4 C) (06/20 0922) BP: (142)/(78) 142/78 (06/20 0922) SpO2:  [100 %] 100 % (06/20 8119) Physical Exam Vitals reviewed.  Constitutional:  Appearance: Normal appearance.  HENT:     Head: Normocephalic and atraumatic.     Nose: Nose normal. No congestion.  Eyes:     Extraocular Movements: Extraocular movements intact.     Pupils: Pupils are equal, round, and reactive to light.  Cardiovascular:     Rate and Rhythm: Normal rate and regular rhythm.  Pulmonary:     Effort: Pulmonary effort is normal. No respiratory distress.  Abdominal:     General: Abdomen is flat. There is no distension.  Musculoskeletal:        General: No swelling. Normal range of motion.     Cervical back: Normal range of motion and neck supple.  Skin:    General: Skin is warm and dry.  Neurological:     General: No focal deficit present.     Mental Status: He is alert and oriented to person, place, and time.  Psychiatric:        Mood and Affect: Mood normal.        Behavior: Behavior normal.        Thought Content: Thought content normal.        Judgment: Judgment normal.    Laboratory Data:  Results for orders placed or performed during the hospital encounter of 11/06/20 (from the past 24 hour(s))  I-STAT, chem 8     Status: Abnormal   Collection Time: 11/06/20  9:04 AM  Result Value Ref Range   Sodium 136 135 - 145 mmol/L   Potassium 4.3 3.5 -  5.1 mmol/L   Chloride 95 (L) 98 - 111 mmol/L   BUN 43 (H) 8 - 23 mg/dL   Creatinine, Ser 5.00 (H) 0.61 - 1.24 mg/dL   Glucose, Bld 87 70 - 99 mg/dL   Calcium, Ion 1.08 (L) 1.15 - 1.40 mmol/L   TCO2 32 22 - 32 mmol/L   Hemoglobin 14.6 13.0 - 17.0 g/dL   HCT 43.0 39.0 - 52.0 %   No results found for this or any previous visit (from the past 240 hour(s)). Creatinine: Recent Labs    11/06/20 0904  CREATININE 5.00*   Baseline Creatinine: NA  Impression/Assessment:  69yo with BPH with incomplete emptying, bladder calculus  Plan:  We discussed the management of his BPH including continued medical therapy, [place], Urolift, TURP and simple prostatectomy. After discussing the options the patient has elected to proceed with Urolift and cystolithalopaxy. Risks/benefits/alternatives discussed.   Nicolette Bang 11/06/2020, 10:09 AM

## 2020-11-06 NOTE — Anesthesia Postprocedure Evaluation (Signed)
Anesthesia Post Note  Patient: [name]  Procedure(s) Performed: CYSTOSCOPY WITH INSERTION OF UROLIFT  Patient location during evaluation: PACU Anesthesia Type: General Level of consciousness: awake and alert and oriented Pain management: pain level controlled Vital Signs Assessment: post-procedure vital signs reviewed and stable Respiratory status: spontaneous breathing and respiratory function stable Cardiovascular status: blood pressure returned to baseline and stable Postop Assessment: no apparent nausea or vomiting Anesthetic complications: no   No notable events documented.   Last Vitals:  Vitals:   11/06/20 1130 11/06/20 1145  BP: 118/61 131/68  Pulse: 60 (!) 59  Resp: 13 15  Temp:    SpO2: 98% 100%    Last Pain:  Vitals:   11/06/20 1150  TempSrc:   PainSc: 1                  Iliyah Bui C Aylah Yeary

## 2020-11-06 NOTE — Anesthesia Procedure Notes (Signed)
Procedure Name: LMA Insertion Date/Time: 11/06/2020 10:44 AM Performed by: Karna Dupes, CRNA Pre-anesthesia Checklist: Patient identified, Emergency Drugs available, Suction available and Patient being monitored Patient Re-evaluated:Patient Re-evaluated prior to induction Oxygen Delivery Method: Circle system utilized Preoxygenation: Pre-oxygenation with 100% oxygen Induction Type: IV induction LMA: LMA inserted LMA Size: 4.0 Number of attempts: 1 Tube secured with: Tape Dental Injury: Teeth and Oropharynx as per pre-operative assessment

## 2020-11-07 ENCOUNTER — Encounter (HOSPITAL_COMMUNITY): Payer: Self-pay | Admitting: Urology

## 2020-11-08 ENCOUNTER — Other Ambulatory Visit: Payer: Self-pay

## 2020-11-08 ENCOUNTER — Ambulatory Visit (INDEPENDENT_AMBULATORY_CARE_PROVIDER_SITE_OTHER): Payer: Medicare Other | Admitting: Urology

## 2020-11-08 VITALS — BP 95/60 | HR 64

## 2020-11-08 DIAGNOSIS — I6523 Occlusion and stenosis of bilateral carotid arteries: Secondary | ICD-10-CM

## 2020-11-08 DIAGNOSIS — R339 Retention of urine, unspecified: Secondary | ICD-10-CM | POA: Diagnosis not present

## 2020-11-08 DIAGNOSIS — N138 Other obstructive and reflux uropathy: Secondary | ICD-10-CM | POA: Diagnosis not present

## 2020-11-08 DIAGNOSIS — N401 Enlarged prostate with lower urinary tract symptoms: Secondary | ICD-10-CM | POA: Diagnosis not present

## 2020-11-08 NOTE — Progress Notes (Signed)
Fill and Pull Catheter Removal  Patient is present today for a catheter removal.  Patient was cleaned and prepped in a sterile fashion 149ml of sterile water/ saline was instilled into the bladder when the patient felt the urge to urinate. 134ml of water was then drained from the balloon.  A 16FR foley cath was removed from the bladder no complications were noted .  Patient as then given some time to void on their own.  Patient can void  31ml on their own after some time.  Patient tolerated well.  Performed by: Estill Bamberg RN  Follow up/ Additional notes: see MD

## 2020-11-08 NOTE — Progress Notes (Signed)
11/08/2020 11:19 AM   [name] [birth year] [id number]  Referring provider: Tobe Sos, MD [address] [place] [address]  Followup BPH   HPI: Mr Duross is a 70yo here for followup after Urolift. Voiding trial passed today. Urine is clear. He denies any pelvic or abdominal pain. No dysuria. No other complaints today   PMH: Past Medical History:  Diagnosis Date   AAA (abdominal aortic aneurysm) ([place]) 06/2016   Mild increase in size of 3.4 cm infrarenal abdominal aortic aneurysm   Anemia    Aortic atherosclerosis (HCC)    Arthritis    Asthma    Atrial flutter (HCC)    AVF (arteriovenous fistula) (HCC)    Left forearm   CAD (coronary artery disease)    a. s/p prior stenting of RCA in 1999 b. cath in 2003 showing moderate CAD c. NST in 2016 showing small area of ischemic and low-risk   CHF (congestive heart failure) ([place])    CKD (chronic kidney disease), stage V ([facility])    kidney transplant evaluation   COPD (chronic obstructive pulmonary disease) ([place])    with ongoing tobacco use and patient failed sham takes   Diverticulosis 2018   Mild sigmoid colon diverticulosis.   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    Reflux esophagitis    hx   Two-vessel coronary artery disease    moderate. by cath in 2003. Status post stenting of the mdi RCA November 1999 normal left ventricular ejection fraction   Wears glasses    Wears hearing aid    B/L    Surgical History: Past Surgical  History:  Procedure Laterality Date   A/V FISTULAGRAM Left 04/30/2019   Procedure: A/V FISTULAGRAM;  Surgeon: Elam Dutch, MD;  Location: [facility];  Service: Cardiovascular;  Laterality: Left;   A/V FISTULAGRAM Left 09/22/2019   Procedure: A/V FISTULAGRAM;  Surgeon: Marty Heck, MD;  Location: [facility];  Service: Cardiovascular;  Laterality: Left;   ABDOMINAL SURGERY     APPENDECTOMY     AV FISTULA PLACEMENT Right 03/11/2018   Procedure: CREATION Brachiocephalic Fistula RIGHT ARM;  Surgeon: Rosetta Posner, MD;  Location: [facility];  Service: Vascular;  Laterality: Right;   AV FISTULA PLACEMENT Left 05/06/2019   Procedure: LEFT BRACHIOCEPHALIC ARTERIOVENOUS (AV) FISTULA CREATION;  Surgeon: Marty Heck, MD;  Location: [facility];  Service: Vascular;  Laterality: Left;   BACK SURGERY     x3; cages in patient's back since 2000   BIOPSY  11/12/2018   Procedure: BIOPSY;  Surgeon: Laurence Spates, MD;  Location: [facility];  Service: Endoscopy;;   CARDIAC CATHETERIZATION     stent   CHOLECYSTECTOMY     CLOSED REDUCTION SHOULDER DISLOCATION     COLONOSCOPY W/ BIOPSIES AND POLYPECTOMY     COLONOSCOPY WITH PROPOFOL N/A 11/12/2018   Procedure: COLONOSCOPY WITH PROPOFOL;  Surgeon:  Laurence Spates, MD;  Location: [facility];  Service: Endoscopy;  Laterality: N/A;   CORONARY ANGIOPLASTY     CYSTOSCOPY WITH INSERTION OF UROLIFT N/A 11/06/2020   Procedure: CYSTOSCOPY WITH INSERTION OF UROLIFT;  Surgeon: Cleon Gustin, MD;  Location: [facility];  Service: Urology;  Laterality: N/A;   diabetic ulcers     DIAGNOSTIC LAPAROSCOPY     DIALYSIS/PERMA CATHETER REMOVAL N/A 09/22/2019   Procedure: DIALYSIS/PERMA CATHETER REMOVAL;  Surgeon: Marty Heck, MD;  Location: [facility];  Service: Cardiovascular;  Laterality: N/A;   ESOPHAGOGASTRODUODENOSCOPY (EGD) WITH PROPOFOL N/A 11/12/2018   Procedure: ESOPHAGOGASTRODUODENOSCOPY (EGD) WITH PROPOFOL;  Surgeon:  Laurence Spates, MD;  Location: [facility];  Service: Endoscopy;  Laterality: N/A;   GROIN EXPLORATION     HEMOSTASIS CLIP PLACEMENT  11/12/2018   Procedure: HEMOSTASIS CLIP PLACEMENT;  Surgeon: Laurence Spates, MD;  Location: [facility];  Service: Endoscopy;;   HIP ARTHROPLASTY Left    gamma nail   INSERT / REPLACE / [place] (AV) ARTEGRAFT ARM Left 03/18/2018   Procedure: INSERTION OF ARTERIOVENOUS (AV) FISTULA;  Surgeon: Rosetta Posner, MD;  Location: [facility];  Service: Vascular;  Laterality: Left;   IR FLUORO GUIDE CV LINE RIGHT  04/11/2019   IR US GUIDE VASC ACCESS RIGHT  04/11/2019   LEFT HEART CATH AND CORONARY ANGIOGRAPHY N/A 07/23/2019   Procedure: LEFT HEART CATH AND CORONARY ANGIOGRAPHY;  Surgeon: Martinique, Peter M, MD;  Location: [facility];  Service: Cardiovascular;  Laterality: N/A;   LIGATION ARTERIOVENOUS GORTEX GRAFT Right 03/18/2018   Procedure: LIGATION ARTERIOVENOUS FISTULA;  Surgeon: Rosetta Posner, MD;  Location: [facility];  Service: Vascular;  Laterality: Right;   OSTECTOMY Left 04/23/2017   Procedure: OSTECTOMY LEFT GREAT TOE;  Surgeon: [name], DPM;  Location: [facility];  Service: Podiatry;  Laterality: Left;  left great toe   POLYPECTOMY  11/12/2018   Procedure: POLYPECTOMY;  Surgeon: Laurence Spates, MD;  Location: [facility];  Service: Endoscopy;;   TUMOR REMOVAL     small intestine x3   VISCERAL ANGIOGRAPHY N/A 08/02/2019   Procedure: VISCERAL ANGIOGRAPHY;  Surgeon: Waynetta Sandy, MD;  Location: [facility];  Service: Cardiovascular;  Laterality: N/A;    Home Medications:  Allergies as of 11/08/2020       Reactions   Other    Opiods cause hallucinations and delusions has to be given risperidone to settle down   Requip [ropinirole] Other (See Comments)   Dry mouth        Medication List        Accurate as of November 08, 2020 11:19 AM. If you have any questions, ask your nurse or doctor.           acidophilus Caps capsule Take 1 capsule by mouth daily.   albuterol 108 (90 Base) MCG/ACT inhaler Commonly known as: VENTOLIN HFA Inhale 2 puffs into the lungs every 6 (six) hours as needed for wheezing or shortness of breath.   aspirin 81 MG EC tablet Take 1 tablet (81 mg total) by mouth daily.   atorvastatin 40 MG tablet Commonly known as: LIPITOR Take 1 tablet by mouth once daily   beclomethasone 80 MCG/ACT inhaler Commonly known as: QVAR Inhale 2 puffs into the lungs 2 (two) times daily as needed (shortness of breath).   buPROPion 150 MG 12 hr tablet Commonly known as: WELLBUTRIN SR Take 150 mg by mouth at bedtime.  CALCIUM 600 PO Take 600 mg by mouth in the morning and at bedtime.   carvedilol 25 MG tablet Commonly known as: COREG Take 1 tablet (25 mg total) by mouth See admin instructions. Take 25 mg twice daily on Sun, Mon, Wed, and Fri. Take 25 mg in the evening on Tues, Thurs, and Sat (dialysis days) What changed:  when to take this additional instructions   clotrimazole 1 % cream Commonly known as: LOTRIMIN Apply 1 application topically 2 (two) times daily as needed (jock itch).   denosumab 60 MG/ML Sosy injection Commonly known as: PROLIA Inject 60 mg into the skin every 6 (six) months.   lactulose 10 GM/15ML solution Commonly known as: CHRONULAC Take 20 g by mouth 2 (two) times daily as needed for mild constipation.   linaclotide 290 MCG Caps capsule Commonly known as: LINZESS Take 290 mcg by mouth daily as needed (constipation).   magnesium oxide 400 MG tablet Commonly known as: MAG-OX Take 400 mg by mouth daily.   Melatonin 10 MG Tabs Take 10 mg by mouth at bedtime as needed (sleep).   multivitamin Tabs tablet Take 1 tablet by mouth daily.   nitrofurantoin (macrocrystal-monohydrate) 100 MG capsule Commonly known as: MACROBID Take 1 capsule (100 mg total) by mouth daily. What changed: when to take this   omeprazole 20 MG  capsule Commonly known as: PRILOSEC Take 20 mg by mouth daily as needed (heartburn).   ondansetron 4 MG tablet Commonly known as: Zofran Take 1 tablet (4 mg total) by mouth every 8 (eight) hours as needed for nausea or vomiting.   polyethylene glycol 17 g packet Commonly known as: MIRALAX / GLYCOLAX Take 17 g by mouth daily as needed for mild constipation.   Potassium Chloride ER 20 MEQ Tbcr Take 20 mEq by mouth daily.   risperiDONE 0.5 MG tablet Commonly known as: RISPERDAL Take 0.5 mg by mouth at bedtime.   Sennosides 25 MG Tabs Take 50 mg by mouth daily as needed (constipation).   sertraline 50 MG tablet Commonly known as: ZOLOFT Take 50 mg by mouth in the morning.   tamsulosin 0.4 MG Caps capsule Commonly known as: FLOMAX Take 1 capsule (0.4 mg total) by mouth daily after supper.   tiZANidine 2 MG tablet Commonly known as: ZANAFLEX Take 2 mg by mouth at bedtime.   tolterodine 4 MG 24 hr capsule Commonly known as: Detrol LA Take 1 capsule (4 mg total) by mouth daily.   traMADol 50 MG tablet Commonly known as: ULTRAM Take 50 mg by mouth every 6 (six) hours as needed for moderate pain.   traMADol 50 MG tablet Commonly known as: Ultram Take 1 tablet (50 mg total) by mouth every 6 (six) hours as needed.   Vitamin D3 50 MCG (2000 UT) Tabs Take 2,000 Units by mouth daily.        Allergies:  Allergies  Allergen Reactions   Other     Opiods cause hallucinations and delusions has to be given risperidone to settle down   Requip [Ropinirole] Other (See Comments)    Dry mouth    Family History: Family History  Problem Relation Age of Onset   Diabetes Mother    Alcohol abuse Father     Social History:  reports that he quit smoking about 11 months ago. His smoking use included cigarettes. He started smoking about 52 years ago. He has never used smokeless tobacco. He reports previous alcohol use. He reports that he does not use drugs.  ROS: All other review  of systems were reviewed and are negative except what is noted above in HPI  Physical Exam: BP 95/60   Pulse 64   Constitutional:  Alert and oriented, No acute distress. HEENT: [place] AT, moist mucus membranes.  Trachea midline, no masses. Cardiovascular: No clubbing, cyanosis, or edema. Respiratory: Normal respiratory effort, no increased work of breathing. GI: Abdomen is soft, nontender, nondistended, no abdominal masses GU: No CVA tenderness.  Lymph: No cervical or inguinal lymphadenopathy. Skin: No rashes, bruises or suspicious lesions. Neurologic: Grossly intact, no focal deficits, moving all 4 extremities. Psychiatric: Normal mood and affect.  Laboratory Data: Lab Results  Component Value Date   WBC 7.0 10/10/2020   HGB 14.6 11/06/2020   HCT 43.0 11/06/2020   MCV 108.6 (H) 10/10/2020   PLT 139 (L) 10/10/2020    Lab Results  Component Value Date   CREATININE 5.00 (H) 11/06/2020    No results found for: PSA  Lab Results  Component Value Date   TESTOSTERONE 297 07/28/2015    Lab Results  Component Value Date   HGBA1C 5.6 10/08/2020    Urinalysis    Component Value Date/Time   COLORURINE YELLOW 07/22/2019 1625   APPEARANCEUR Hazy (A) 09/25/2020 1149   LABSPEC 1.010 07/22/2019 1625   PHURINE 6.0 07/22/2019 1625   GLUCOSEU Negative 09/25/2020 1149   HGBUR NEGATIVE 07/22/2019 1625   BILIRUBINUR Negative 09/25/2020 1149   KETONESUR NEGATIVE 07/22/2019 1625   PROTEINUR 2+ (A) 09/25/2020 1149   PROTEINUR >=300 (A) 07/22/2019 1625   UROBILINOGEN 0.2 09/27/2014 0027   NITRITE Negative 09/25/2020 1149   NITRITE NEGATIVE 07/22/2019 1625   LEUKOCYTESUR 1+ (A) 09/25/2020 1149   LEUKOCYTESUR NEGATIVE 07/22/2019 1625    Lab Results  Component Value Date   LABMICR See below: 09/25/2020   WBCUA 11-30 (A) 09/25/2020   LABEPIT 0-10 09/25/2020   MUCUS Present 09/11/2020   BACTERIA Few (A) 09/25/2020    Pertinent Imaging:  No results found for this or any previous  visit.  No results found for this or any previous visit.  No results found for this or any previous visit.  No results found for this or any previous visit.  Results for orders placed in visit on 09/15/20  US RENAL  Narrative CLINICAL DATA:  Recurrent urinary tract infection.  EXAM: RENAL / URINARY TRACT ULTRASOUND COMPLETE  COMPARISON:  Renal ultrasound 04/05/2016.  Abdominal CT 07/28/2019.  FINDINGS: Right Kidney:  Renal measurements: 10.3 x 5.1 x 4.0 cm = volume: 109.8 mL. Mild renal cortical thinning and increased echogenicity. There is a cystic lesion in the upper pole which measures up to 1.8 cm. Prominent renal sinus fat. No hydronephrosis or suspicious cortical lesion.  Left Kidney:  Renal measurements: 10.5 x 6.2 x 4.2 cm = volume: 140.9 mL. Mild renal cortical thinning and increased echogenicity. Prominent renal sinus fat. No hydronephrosis or focal cortical lesion.  Bladder:  Not visualized.  Other:  Study is mildly limited by body habitus.  No evidence of ascites.  IMPRESSION: 1. Both kidneys demonstrate mild cortical thinning and increased cortical echogenicity consistent with "chronic medical renal disease". 2. Small cyst in the upper pole of the right kidney. No hydronephrosis. 3. The bladder was not visualized.   Electronically Signed By: Richardean Sale M.D. On: 09/18/2020 12:11  No results found for this or any previous visit.  No results found for this or any previous visit.  No results found for this or any previous visit.  Assessment & Plan:    1. Benign prostatic hyperplasia with urinary obstruction Voiding trial passed today. RTC 2 weeks for a PVR  2. Incomplete emptying of bladder -RTC 2 weeks for a PVR   No follow-ups on file.  Nicolette Bang, MD  [facility] [place]

## 2020-11-10 ENCOUNTER — Ambulatory Visit (INDEPENDENT_AMBULATORY_CARE_PROVIDER_SITE_OTHER): Payer: Medicare Other

## 2020-11-10 ENCOUNTER — Other Ambulatory Visit: Payer: Self-pay

## 2020-11-10 DIAGNOSIS — R339 Retention of urine, unspecified: Secondary | ICD-10-CM

## 2020-11-10 NOTE — Progress Notes (Signed)
Patient here for PVR. Patient voided 200cc in urinal at arrival. PVR 0cc.

## 2020-11-13 ENCOUNTER — Telehealth: Payer: Self-pay

## 2020-11-13 ENCOUNTER — Other Ambulatory Visit: Payer: Self-pay

## 2020-11-13 NOTE — Telephone Encounter (Signed)
Patient scheduled for tomorrow. Wife called and made aware.

## 2020-11-14 ENCOUNTER — Ambulatory Visit (INDEPENDENT_AMBULATORY_CARE_PROVIDER_SITE_OTHER): Payer: Medicare Other | Admitting: Urology

## 2020-11-14 ENCOUNTER — Encounter: Payer: Self-pay | Admitting: Urology

## 2020-11-14 ENCOUNTER — Other Ambulatory Visit: Payer: Self-pay

## 2020-11-14 VITALS — BP 94/63 | HR 60 | Temp 97.9°F | Wt 216.0 lb

## 2020-11-14 DIAGNOSIS — N390 Urinary tract infection, site not specified: Secondary | ICD-10-CM

## 2020-11-14 DIAGNOSIS — I6523 Occlusion and stenosis of bilateral carotid arteries: Secondary | ICD-10-CM | POA: Diagnosis not present

## 2020-11-14 DIAGNOSIS — R339 Retention of urine, unspecified: Secondary | ICD-10-CM | POA: Diagnosis not present

## 2020-11-14 MED ORDER — NYSTATIN-TRIAMCINOLONE 100000-0.1 UNIT/GM-% EX OINT: 1.0000 "application " | TOPICAL_OINTMENT | Freq: Two times a day (BID) | CUTANEOUS | 0 refills | Status: DC

## 2020-11-14 MED ORDER — CEPHALEXIN 250 MG PO CAPS: 250.0000 mg | ORAL_CAPSULE | Freq: Every day | ORAL | 0 refills | Status: DC

## 2020-11-14 NOTE — Patient Instructions (Signed)
Benign Prostatic Hyperplasia  Benign prostatic hyperplasia (BPH) is an enlarged prostate gland that is caused by the normal aging process and not by cancer. The prostate is a walnut-sized gland that is involved in the production of semen. It is located in front of the rectum and below the bladder. The bladder stores urine and the urethra is the tube that carries the urine out of the body. The prostate may get bigger asa man gets older. An enlarged prostate can press on the urethra. This can make it harder to pass urine. The build-up of urine in the bladder can cause infection. Back pressure and infection may progress to bladder damage and kidney (renal) failure. What are the causes? This condition is part of a normal aging process. However, not all men develop problems from this condition. If the prostate enlarges away from the urethra, urine flow will not be blocked. If it enlarges toward the urethra andcompresses it, there will be problems passing urine. What increases the risk? This condition is more likely to develop in men over the age of 50 years. What are the signs or symptoms? Symptoms of this condition include: Getting up often during the night to urinate. Needing to urinate frequently during the day. Difficulty starting urine flow. Decrease in size and strength of your urine stream. Leaking (dribbling) after urinating. Inability to pass urine. This needs immediate treatment. Inability to completely empty your bladder. Pain when you pass urine. This is more common if there is also an infection. Urinary tract infection (UTI). How is this diagnosed? This condition is diagnosed based on your medical history, a physical exam, and your symptoms. Tests will also be done, such as: A post-void bladder scan. This measures any amount of urine that may remain in your bladder after you finish urinating. A digital rectal exam. In a rectal exam, your health care provider checks your prostate by  putting a lubricated, gloved finger into your rectum to feel the back of your prostate gland. This exam detects the size of your gland and any abnormal lumps or growths. An exam of your urine (urinalysis). A prostate specific antigen (PSA) screening. This is a blood test used to screen for prostate cancer. An ultrasound. This test uses sound waves to electronically produce a picture of your prostate gland. Your health care provider may refer you to a specialist in kidney and prostate diseases (urologist). How is this treated? Once symptoms begin, your health care provider will monitor your condition (active surveillance or watchful waiting). Treatment for this condition will depend on the severity of your condition. Treatment may include: Observation and yearly exams. This may be the only treatment needed if your condition and symptoms are mild. Medicines to relieve your symptoms, including: Medicines to shrink the prostate. Medicines to relax the muscle of the prostate. Surgery in severe cases. Surgery may include: Prostatectomy. In this procedure, the prostate tissue is removed completely through an open incision or with a laparoscope or robotics. Transurethral resection of the prostate (TURP). In this procedure, a tool is inserted through the opening at the tip of the penis (urethra). It is used to cut away tissue of the inner core of the prostate. The pieces are removed through the same opening of the penis. This removes the blockage. Transurethral incision (TUIP). In this procedure, small cuts are made in the prostate. This lessens the prostate's pressure on the urethra. Transurethral microwave thermotherapy (TUMT). This procedure uses microwaves to create heat. The heat destroys and removes a small   amount of prostate tissue. Transurethral needle ablation (TUNA). This procedure uses radio frequencies to destroy and remove a small amount of prostate tissue. Interstitial laser coagulation (ILC).  This procedure uses a laser to destroy and remove a small amount of prostate tissue. Transurethral electrovaporization (TUVP). This procedure uses electrodes to destroy and remove a small amount of prostate tissue. Prostatic urethral lift. This procedure inserts an implant to push the lobes of the prostate away from the urethra. Follow these instructions at home: Take over-the-counter and prescription medicines only as told by your health care provider. Monitor your symptoms for any changes. Contact your health care provider with any changes. Avoid drinking large amounts of liquid before going to bed or out in public. Avoid or reduce how much caffeine or alcohol you drink. Give yourself time when you urinate. Keep all follow-up visits as told by your health care provider. This is important. Contact a health care provider if: You have unexplained back pain. Your symptoms do not get better with treatment. You develop side effects from the medicine you are taking. Your urine becomes very dark or has a bad smell. Your lower abdomen becomes distended and you have trouble passing your urine. Get help right away if: You have a fever or chills. You suddenly cannot urinate. You feel lightheaded, or very dizzy, or you faint. There are large amounts of blood or clots in the urine. Your urinary problems become hard to manage. You develop moderate to severe low back or flank pain. The flank is the side of your body between the ribs and the hip. These symptoms may represent a serious problem that is an emergency. Do not wait to see if the symptoms will go away. Get medical help right away. Call your local emergency services (911 in the [place] Do not drive yourself to the hospital. Summary Benign prostatic hyperplasia (BPH) is an enlarged prostate that is caused by the normal aging process and not by cancer. An enlarged prostate can press on the urethra. This can make it hard to pass urine. This  condition is part of a normal aging process and is more likely to develop in men over the age of 50 years. Get help right away if you suddenly cannot urinate. This information is not intended to replace advice given to you by your health care provider. Make sure you discuss any questions you have with your healthcare provider. Document Revised: 01/13/2020 Document Reviewed: 01/13/2020 Elsevier Patient Education  2022 [employer]  

## 2020-11-14 NOTE — Progress Notes (Signed)
Urological Symptom Review  Patient is experiencing the following symptoms: na    Review of Systems  Gastrointestinal (upper)  : Negative for upper GI symptoms  Gastrointestinal (lower) : Negative for lower GI symptoms  Constitutional : Negative for symptoms  Skin: Negative for skin symptoms  Eyes: Negative for eye symptoms  Ear/Nose/Throat : Negative for Ear/Nose/Throat symptoms  Hematologic/Lymphatic: Negative for Hematologic/Lymphatic symptoms  Cardiovascular : Negative for cardiovascular symptoms  Respiratory : Negative for respiratory symptoms  Endocrine: Negative for endocrine symptoms  Musculoskeletal: Negative for musculoskeletal symptoms  Neurological: Negative for neurological symptoms  Psychologic: Negative for psychiatric symptoms

## 2020-11-14 NOTE — Progress Notes (Signed)
11/14/2020 2:35 PM   [name] 1950/07/25 [id number]  Referring provider: Tobe Sos, MD [address] [place] [address]  Scrotal mass   HPI: Mr Klipfel is a 70yo here with a new scrotal mass. He noted a growing scrotal mass 1 week ago. No pain from mass, no drainage  or bleeding from lesion. No fevers. No unexplained weight loss.    PMH: Past Medical History:  Diagnosis Date   AAA (abdominal aortic aneurysm) ([facility]) 06/2016   Mild increase in size of 3.4 cm infrarenal abdominal aortic aneurysm   Anemia    Aortic atherosclerosis (HCC)    Arthritis    Asthma    Atrial flutter (HCC)    AVF (arteriovenous fistula) (HCC)    Left forearm   CAD (coronary artery disease)    a. s/p prior stenting of RCA in 1999 b. cath in 2003 showing moderate CAD c. NST in 2016 showing small area of ischemic and low-risk   CHF (congestive heart failure) ([facility])    CKD (chronic kidney disease), stage V ([facility])    kidney transplant evaluation   COPD (chronic obstructive pulmonary disease) ([facility])    with ongoing tobacco use and patient failed sham takes   Diverticulosis 2018   Mild sigmoid colon diverticulosis.   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    Reflux esophagitis    hx   Two-vessel coronary artery disease    moderate. by cath in 2003. Status post stenting of the mdi RCA November 1999 normal left ventricular ejection fraction   Wears glasses    Wears hearing aid    B/L    Surgical History: Past  Surgical History:  Procedure Laterality Date   A/V FISTULAGRAM Left 04/30/2019   Procedure: A/V FISTULAGRAM;  Surgeon: Elam Dutch, MD;  Location: [facility];  Service: Cardiovascular;  Laterality: Left;   A/V FISTULAGRAM Left 09/22/2019   Procedure: A/V FISTULAGRAM;  Surgeon: Marty Heck, MD;  Location: [facility];  Service: Cardiovascular;  Laterality: Left;   ABDOMINAL SURGERY     APPENDECTOMY     AV FISTULA PLACEMENT Right 03/11/2018   Procedure: CREATION Brachiocephalic Fistula RIGHT ARM;  Surgeon: Rosetta Posner, MD;  Location: [facility];  Service: Vascular;  Laterality: Right;   AV FISTULA PLACEMENT Left 05/06/2019   Procedure: LEFT BRACHIOCEPHALIC ARTERIOVENOUS (AV) FISTULA CREATION;  Surgeon: Marty Heck, MD;  Location: [facility];  Service: Vascular;  Laterality: Left;   BACK SURGERY     x3; cages in patient's back since 2000   BIOPSY  11/12/2018   Procedure: BIOPSY;  Surgeon: Laurence Spates, MD;  Location: [facility];  Service: Endoscopy;;   CARDIAC CATHETERIZATION     stent   CHOLECYSTECTOMY     CLOSED REDUCTION SHOULDER DISLOCATION     COLONOSCOPY W/ BIOPSIES AND POLYPECTOMY     COLONOSCOPY WITH PROPOFOL N/A 11/12/2018  Procedure: COLONOSCOPY WITH PROPOFOL;  Surgeon: Laurence Spates, MD;  [facility]: [facility];  Service: Endoscopy;  Laterality: N/A;   CORONARY ANGIOPLASTY     CYSTOSCOPY WITH INSERTION OF UROLIFT N/A 11/06/2020   Procedure: CYSTOSCOPY WITH INSERTION OF UROLIFT;  Surgeon: Cleon Gustin, MD;  Location: [facility];  Service: Urology;  Laterality: N/A;   diabetic ulcers     DIAGNOSTIC LAPAROSCOPY     DIALYSIS/PERMA CATHETER REMOVAL N/A 09/22/2019   Procedure: DIALYSIS/PERMA CATHETER REMOVAL;  Surgeon: Marty Heck, MD;  Location: [facility];  Service: Cardiovascular;  Laterality: N/A;   ESOPHAGOGASTRODUODENOSCOPY (EGD) WITH PROPOFOL N/A 11/12/2018   Procedure: ESOPHAGOGASTRODUODENOSCOPY (EGD) WITH PROPOFOL;   Surgeon: Laurence Spates, MD;  [facility];  Service: Endoscopy;  Laterality: N/A;   GROIN EXPLORATION     HEMOSTASIS CLIP PLACEMENT  11/12/2018   Procedure: HEMOSTASIS CLIP PLACEMENT;  Surgeon: Laurence Spates, MD;  [facility];  Service: Endoscopy;;   HIP ARTHROPLASTY Left    gamma nail   INSERT / REPLACE / [place] (AV) ARTEGRAFT ARM Left 03/18/2018   Procedure: INSERTION OF ARTERIOVENOUS (AV) FISTULA;  Surgeon: Rosetta Posner, MD;  Location: [facility];  Service: Vascular;  Laterality: Left;   IR FLUORO GUIDE CV LINE RIGHT  04/11/2019   IR US GUIDE VASC ACCESS RIGHT  04/11/2019   LEFT HEART CATH AND CORONARY ANGIOGRAPHY N/A 07/23/2019   Procedure: LEFT HEART CATH AND CORONARY ANGIOGRAPHY;  Surgeon: Martinique, Peter M, MD;  Location: [facility];  Service: Cardiovascular;  Laterality: N/A;   LIGATION ARTERIOVENOUS GORTEX GRAFT Right 03/18/2018   Procedure: LIGATION ARTERIOVENOUS FISTULA;  Surgeon: Rosetta Posner, MD;  [facility];  Service: Vascular;  Laterality: Right;   OSTECTOMY Left 04/23/2017   Procedure: OSTECTOMY LEFT GREAT TOE;  Surgeon: [name], DPM;  [facility];  Service: Podiatry;  Laterality: Left;  left great toe   POLYPECTOMY  11/12/2018   Procedure: POLYPECTOMY;  Surgeon: Laurence Spates, MD;  [facility];  Service: Endoscopy;;   TUMOR REMOVAL     small intestine x3   VISCERAL ANGIOGRAPHY N/A 08/02/2019   Procedure: VISCERAL ANGIOGRAPHY;  Surgeon: Waynetta Sandy, MD;  Location: [facility];  Service: Cardiovascular;  Laterality: N/A;    Home Medications:  Allergies as of 11/14/2020       Reactions   Other    Opiods cause hallucinations and delusions has to be given risperidone to settle down   Requip [ropinirole] Other (See Comments)   Dry mouth        Medication List        Accurate as of November 14, 2020  2:35 PM. If you have any questions, ask your nurse  or doctor.          acidophilus Caps capsule Take 1 capsule by mouth daily.   albuterol 108 (90 Base) MCG/ACT inhaler Commonly known as: VENTOLIN HFA Inhale 2 puffs into the lungs every 6 (six) hours as needed for wheezing or shortness of breath.   aspirin 81 MG EC tablet Take 1 tablet (81 mg total) by mouth daily.   atorvastatin 40 MG tablet Commonly known as: LIPITOR Take 1 tablet by mouth once daily   beclomethasone 80 MCG/ACT inhaler Commonly known as: QVAR Inhale 2 puffs into the lungs 2 (two) times daily as needed (shortness of breath).   buPROPion 150 MG 12 hr tablet Commonly known as: WELLBUTRIN SR Take  150 mg by mouth at bedtime.   CALCIUM 600 PO Take 600 mg by mouth in the morning and at bedtime.   carvedilol 25 MG tablet Commonly known as: COREG Take 1 tablet (25 mg total) by mouth See admin instructions. Take 25 mg twice daily on Sun, Mon, Wed, and Fri. Take 25 mg in the evening on Tues, Thurs, and Sat (dialysis days) What changed:  when to take this additional instructions   cephALEXin 250 MG capsule Commonly known as: Keflex Take 1 capsule (250 mg total) by mouth daily. Started by: Nicolette Bang, MD   clotrimazole 1 % cream Commonly known as: LOTRIMIN Apply 1 application topically 2 (two) times daily as needed (jock itch).   denosumab 60 MG/ML Sosy injection Commonly known as: PROLIA Inject 60 mg into the skin every 6 (six) months.   lactulose 10 GM/15ML solution Commonly known as: CHRONULAC Take 20 g by mouth 2 (two) times daily as needed for mild constipation.   linaclotide 290 MCG Caps capsule Commonly known as: LINZESS Take 290 mcg by mouth daily as needed (constipation).   magnesium oxide 400 MG tablet Commonly known as: MAG-OX Take 400 mg by mouth daily.   Melatonin 10 MG Tabs Take 10 mg by mouth at bedtime as needed (sleep).   multivitamin Tabs tablet Take 1 tablet by mouth daily.   nitrofurantoin (macrocrystal-monohydrate)  100 MG capsule Commonly known as: MACROBID Take 1 capsule (100 mg total) by mouth daily. What changed: when to take this   nystatin-triamcinolone ointment Commonly known as: MYCOLOG Apply 1 application topically 2 (two) times daily. Started by: Nicolette Bang, MD   omeprazole 20 MG capsule Commonly known as: PRILOSEC Take 20 mg by mouth daily as needed (heartburn).   ondansetron 4 MG tablet Commonly known as: Zofran Take 1 tablet (4 mg total) by mouth every 8 (eight) hours as needed for nausea or vomiting.   polyethylene glycol 17 g packet Commonly known as: MIRALAX / GLYCOLAX Take 17 g by mouth daily as needed for mild constipation.   Potassium Chloride ER 20 MEQ Tbcr Take 20 mEq by mouth daily.   risperiDONE 0.5 MG tablet Commonly known as: RISPERDAL Take 0.5 mg by mouth at bedtime.   Sennosides 25 MG Tabs Take 50 mg by mouth daily as needed (constipation).   sertraline 50 MG tablet Commonly known as: ZOLOFT Take 50 mg by mouth in the morning.   tamsulosin 0.4 MG Caps capsule Commonly known as: FLOMAX Take 1 capsule (0.4 mg total) by mouth daily after supper.   tiZANidine 2 MG tablet Commonly known as: ZANAFLEX Take 2 mg by mouth at bedtime.   tolterodine 4 MG 24 hr capsule Commonly known as: Detrol LA Take 1 capsule (4 mg total) by mouth daily.   traMADol 50 MG tablet Commonly known as: ULTRAM Take 50 mg by mouth every 6 (six) hours as needed for moderate pain.   traMADol 50 MG tablet Commonly known as: Ultram Take 1 tablet (50 mg total) by mouth every 6 (six) hours as needed.   Vitamin D3 50 MCG (2000 UT) Tabs Take 2,000 Units by mouth daily.        Allergies:  Allergies  Allergen Reactions   Other     Opiods cause hallucinations and delusions has to be given risperidone to settle down   Requip [Ropinirole] Other (See Comments)    Dry mouth    Family History: Family History  Problem Relation Age of Onset   Diabetes Mother  Alcohol  abuse Father     Social History:  reports that he quit smoking about a year ago. His smoking use included cigarettes. He started smoking about 52 years ago. He has never used smokeless tobacco. He reports previous alcohol use. He reports that he does not use drugs.  ROS: All other review of systems were reviewed and are negative except what is noted above in HPI  Physical Exam: BP 94/63   Pulse 60   Temp 97.9 F (36.6 C)   Wt 216 lb (98 kg)   BMI 26.29 kg/m   Constitutional:  Alert and oriented, No acute distress. HEENT: [place] AT, moist mucus membranes.  Trachea midline, no masses. Cardiovascular: No clubbing, cyanosis, or edema. Respiratory: Normal respiratory effort, no increased work of breathing. GI: Abdomen is soft, nontender, nondistended, no abdominal masses GU: No CVA tenderness.  1.5cm posterior wall scrotal sebaceous cyst Lymph: No cervical or inguinal lymphadenopathy. Skin: No rashes, bruises or suspicious lesions. Neurologic: Grossly intact, no focal deficits, moving all 4 extremities. Psychiatric: Normal mood and affect.  Laboratory Data: Lab Results  Component Value Date   WBC 7.0 10/10/2020   HGB 14.6 11/06/2020   HCT 43.0 11/06/2020   MCV 108.6 (H) 10/10/2020   PLT 139 (L) 10/10/2020    Lab Results  Component Value Date   CREATININE 5.00 (H) 11/06/2020    No results found for: PSA  Lab Results  Component Value Date   TESTOSTERONE 297 07/28/2015    Lab Results  Component Value Date   HGBA1C 5.6 10/08/2020    Urinalysis    Component Value Date/Time   COLORURINE YELLOW 07/22/2019 1625   APPEARANCEUR Hazy (A) 09/25/2020 1149   LABSPEC 1.010 07/22/2019 1625   PHURINE 6.0 07/22/2019 1625   GLUCOSEU Negative 09/25/2020 1149   HGBUR NEGATIVE 07/22/2019 1625   BILIRUBINUR Negative 09/25/2020 1149   KETONESUR NEGATIVE 07/22/2019 1625   PROTEINUR 2+ (A) 09/25/2020 1149   PROTEINUR >=300 (A) 07/22/2019 1625   UROBILINOGEN 0.2 09/27/2014 0027    NITRITE Negative 09/25/2020 1149   NITRITE NEGATIVE 07/22/2019 1625   LEUKOCYTESUR 1+ (A) 09/25/2020 1149   LEUKOCYTESUR NEGATIVE 07/22/2019 1625    Lab Results  Component Value Date   LABMICR See below: 09/25/2020   WBCUA 11-30 (A) 09/25/2020   LABEPIT 0-10 09/25/2020   MUCUS Present 09/11/2020   BACTERIA Few (A) 09/25/2020    Pertinent Imaging:  No results found for this or any previous visit.  No results found for this or any previous visit.  No results found for this or any previous visit.  No results found for this or any previous visit.  Results for orders placed in visit on 09/15/20  US RENAL  Narrative CLINICAL DATA:  Recurrent urinary tract infection.  EXAM: RENAL / URINARY TRACT ULTRASOUND COMPLETE  COMPARISON:  Renal ultrasound 04/05/2016.  Abdominal CT 07/28/2019.  FINDINGS: Right Kidney:  Renal measurements: 10.3 x 5.1 x 4.0 cm = volume: 109.8 mL. Mild renal cortical thinning and increased echogenicity. There is a cystic lesion in the upper pole which measures up to 1.8 cm. Prominent renal sinus fat. No hydronephrosis or suspicious cortical lesion.  Left Kidney:  Renal measurements: 10.5 x 6.2 x 4.2 cm = volume: 140.9 mL. Mild renal cortical thinning and increased echogenicity. Prominent renal sinus fat. No hydronephrosis or focal cortical lesion.  Bladder:  Not visualized.  Other:  Study is mildly limited by body habitus.  No evidence of ascites.  IMPRESSION: 1. Both kidneys  demonstrate mild cortical thinning and increased cortical echogenicity consistent with "chronic medical renal disease". 2. Small cyst in the upper pole of the right kidney. No hydronephrosis. 3. The bladder was not visualized.   Electronically Signed By: Richardean Sale M.D. On: 09/18/2020 12:11  No results found for this or any previous visit.  No results found for this or any previous visit.  No results found for this or any previous  visit.   Assessment & Plan:    1. Scrotal sebaceous cyst -Keflex 250mg  daily for 7 days -Patient defers excision at this time - Urinalysis, Routine w reflex microscopic   No follow-ups on file.  Nicolette Bang, MD  [facility] [place]

## 2020-11-16 ENCOUNTER — Telehealth: Payer: Self-pay

## 2020-11-16 NOTE — Telephone Encounter (Signed)
Patient called office and wishes to proceed with surgery to help with knot on his testicle.  Message sent to Dr. Alyson Ingles.

## 2020-11-21 ENCOUNTER — Encounter: Payer: Self-pay | Admitting: Urology

## 2020-11-21 NOTE — Patient Instructions (Signed)
Epidermoid Cyst  An epidermoid cyst, also known as epidermal cyst, is a sac made of skin tissue. The sac contains a substance called keratin. Keratin is a protein that is normally secreted through the hair follicles. When keratin becomes trapped in the top layer of skin (epidermis), it can form an epidermoid cyst. Epidermoid cysts can be found anywhere on your body. These cysts are usually harmless (benign), and they may not cause symptoms unless they become inflamed or infected. What are the causes? This condition may be caused by: A blocked hair follicle. A hair that curls and re-enters the skin instead of growing straight out of the skin (ingrown hair). A blocked pore. Irritated skin. An injury to the skin. Certain conditions that are passed along from parent to child (inherited). Human papillomavirus (HPV). This happens rarely when cysts occur on the bottom of the feet. Long-term (chronic) sun damage to the skin. What increases the risk? The following factors may make you more likely to develop an epidermoid cyst: Having acne. Being male. Having an injury to the skin. Being past puberty. Having certain rare genetic disorders. What are the signs or symptoms? The only symptom of this condition may be a small, painless lump underneath the skin. When an epidermal cyst ruptures, it may become inflamed. True infection in cysts is rare. Symptoms may include: Redness. Inflammation. Tenderness. Warmth. Keratin draining from the cyst. Keratin is grayish-white, bad-smelling substance. Pus draining from the cyst. How is this diagnosed? This condition is diagnosed with a physical exam. In some cases, you may have a sample of tissue (biopsy) taken from your cyst to be examined under a microscope or tested for bacteria. You may be referred to a health care provider who specializes in skin care (dermatologist). How is this treated? If a cyst becomes inflamed, treatment may include: Opening and  draining the cyst, done by a health care provider. After draining, minor surgery to remove the rest of the cyst may be done. Taking antibiotic medicine. Having injections of medicines (steroids) that help to reduce inflammation. Having surgery to remove the cyst. Surgery may be done if the cyst: Becomes large. Bothers you. Has a chance of turning into cancer. Do not try to open a cyst yourself. Follow these instructions at home: Medicines If you were prescribed an antibiotic medicine, take it it as told by your health care provider. Do not stop using the antibiotic even if you start to feel better. Take over-the-counter and prescription medicines only as told by your health care provider. General instructions Keep the area around your cyst clean and dry. Wear loose, dry clothing. Avoid touching your cyst. Check your cyst every day for signs of infection. Check for: Redness, swelling, or pain. Fluid or blood. Warmth. Pus or a bad smell. Keep all follow-up visits. This is important. How is this prevented? Wear clean, dry, clothing. Avoid wearing tight clothing. Keep your skin clean and dry. Take showers or baths every day. Contact a health care provider if: Your cyst develops symptoms of infection. Your condition is not improving or is getting worse. You develop a cyst that looks different from other cysts you have had. You have a fever. Get help right away if: Redness spreads from the cyst into the surrounding area. Summary An epidermoid cyst is a sac made of skin tissue. These cysts are usually harmless (benign), and they may not cause symptoms unless they become inflamed. If a cyst becomes inflamed, treatment may include surgery to open and drain the   cyst, or to remove it. Treatment may also include medicines by mouth or through an injection. Take over-the-counter and prescription medicines only as told by your health care provider. If you were prescribed an antibiotic medicine,  take it as told by your health care provider. Do not stop using the antibiotic even if you start to feel better. Contact a health care provider if your condition is not improving or is getting worse. Keep all follow-up visits as told by your health care provider. This is important. This information is not intended to replace advice given to you by your health care provider. Make sure you discuss any questions you have with your healthcare provider. Document Revised: 08/11/2019 Document Reviewed: 08/11/2019 Elsevier Patient Education  [place].

## 2020-11-24 ENCOUNTER — Other Ambulatory Visit: Payer: Self-pay | Admitting: Urology

## 2020-11-24 DIAGNOSIS — N390 Urinary tract infection, site not specified: Secondary | ICD-10-CM

## 2020-11-28 NOTE — Telephone Encounter (Signed)
Wife called and made aware. Wife states that the area is no longer draining and it looks better.

## 2020-11-29 ENCOUNTER — Ambulatory Visit (INDEPENDENT_AMBULATORY_CARE_PROVIDER_SITE_OTHER): Payer: Medicare Other | Admitting: Urology

## 2020-11-29 ENCOUNTER — Encounter: Payer: Self-pay | Admitting: Urology

## 2020-11-29 ENCOUNTER — Other Ambulatory Visit: Payer: Self-pay

## 2020-11-29 VITALS — BP 87/58 | HR 82 | Temp 98.0°F

## 2020-11-29 DIAGNOSIS — N401 Enlarged prostate with lower urinary tract symptoms: Secondary | ICD-10-CM | POA: Diagnosis not present

## 2020-11-29 DIAGNOSIS — I6523 Occlusion and stenosis of bilateral carotid arteries: Secondary | ICD-10-CM | POA: Diagnosis not present

## 2020-11-29 DIAGNOSIS — N390 Urinary tract infection, site not specified: Secondary | ICD-10-CM | POA: Diagnosis not present

## 2020-11-29 DIAGNOSIS — R339 Retention of urine, unspecified: Secondary | ICD-10-CM | POA: Diagnosis not present

## 2020-11-29 DIAGNOSIS — N138 Other obstructive and reflux uropathy: Secondary | ICD-10-CM

## 2020-11-29 MED ORDER — NITROFURANTOIN MONOHYD MACRO 100 MG PO CAPS: 100.0000 mg | ORAL_CAPSULE | Freq: Every day | ORAL | 3 refills | Status: DC

## 2020-11-29 MED ORDER — TOLTERODINE TARTRATE ER 4 MG PO CP24: 4.0000 mg | ORAL_CAPSULE | Freq: Every day | ORAL | 1 refills | Status: DC

## 2020-11-29 NOTE — Patient Instructions (Signed)
Benign Prostatic Hyperplasia  Benign prostatic hyperplasia (BPH) is an enlarged prostate gland that is caused by the normal aging process and not by cancer. The prostate is a walnut-sized gland that is involved in the production of semen. It is located in front of the rectum and below the bladder. The bladder stores urine and the urethra is the tube that carries the urine out of the body. The prostate may get bigger asa man gets older. An enlarged prostate can press on the urethra. This can make it harder to pass urine. The build-up of urine in the bladder can cause infection. Back pressure and infection may progress to bladder damage and kidney (renal) failure. What are the causes? This condition is part of a normal aging process. However, not all men develop problems from this condition. If the prostate enlarges away from the urethra, urine flow will not be blocked. If it enlarges toward the urethra andcompresses it, there will be problems passing urine. What increases the risk? This condition is more likely to develop in men over the age of 50 years. What are the signs or symptoms? Symptoms of this condition include: Getting up often during the night to urinate. Needing to urinate frequently during the day. Difficulty starting urine flow. Decrease in size and strength of your urine stream. Leaking (dribbling) after urinating. Inability to pass urine. This needs immediate treatment. Inability to completely empty your bladder. Pain when you pass urine. This is more common if there is also an infection. Urinary tract infection (UTI). How is this diagnosed? This condition is diagnosed based on your medical history, a physical exam, and your symptoms. Tests will also be done, such as: A post-void bladder scan. This measures any amount of urine that may remain in your bladder after you finish urinating. A digital rectal exam. In a rectal exam, your health care provider checks your prostate by  putting a lubricated, gloved finger into your rectum to feel the back of your prostate gland. This exam detects the size of your gland and any abnormal lumps or growths. An exam of your urine (urinalysis). A prostate specific antigen (PSA) screening. This is a blood test used to screen for prostate cancer. An ultrasound. This test uses sound waves to electronically produce a picture of your prostate gland. Your health care provider may refer you to a specialist in kidney and prostate diseases (urologist). How is this treated? Once symptoms begin, your health care provider will monitor your condition (active surveillance or watchful waiting). Treatment for this condition will depend on the severity of your condition. Treatment may include: Observation and yearly exams. This may be the only treatment needed if your condition and symptoms are mild. Medicines to relieve your symptoms, including: Medicines to shrink the prostate. Medicines to relax the muscle of the prostate. Surgery in severe cases. Surgery may include: Prostatectomy. In this procedure, the prostate tissue is removed completely through an open incision or with a laparoscope or robotics. Transurethral resection of the prostate (TURP). In this procedure, a tool is inserted through the opening at the tip of the penis (urethra). It is used to cut away tissue of the inner core of the prostate. The pieces are removed through the same opening of the penis. This removes the blockage. Transurethral incision (TUIP). In this procedure, small cuts are made in the prostate. This lessens the prostate's pressure on the urethra. Transurethral microwave thermotherapy (TUMT). This procedure uses microwaves to create heat. The heat destroys and removes a small   amount of prostate tissue. Transurethral needle ablation (TUNA). This procedure uses radio frequencies to destroy and remove a small amount of prostate tissue. Interstitial laser coagulation (ILC).  This procedure uses a laser to destroy and remove a small amount of prostate tissue. Transurethral electrovaporization (TUVP). This procedure uses electrodes to destroy and remove a small amount of prostate tissue. Prostatic urethral lift. This procedure inserts an implant to push the lobes of the prostate away from the urethra. Follow these instructions at home: Take over-the-counter and prescription medicines only as told by your health care provider. Monitor your symptoms for any changes. Contact your health care provider with any changes. Avoid drinking large amounts of liquid before going to bed or out in public. Avoid or reduce how much caffeine or alcohol you drink. Give yourself time when you urinate. Keep all follow-up visits as told by your health care provider. This is important. Contact a health care provider if: You have unexplained back pain. Your symptoms do not get better with treatment. You develop side effects from the medicine you are taking. Your urine becomes very dark or has a bad smell. Your lower abdomen becomes distended and you have trouble passing your urine. Get help right away if: You have a fever or chills. You suddenly cannot urinate. You feel lightheaded, or very dizzy, or you faint. There are large amounts of blood or clots in the urine. Your urinary problems become hard to manage. You develop moderate to severe low back or flank pain. The flank is the side of your body between the ribs and the hip. These symptoms may represent a serious problem that is an emergency. Do not wait to see if the symptoms will go away. Get medical help right away. Call your local emergency services (911 in the [place] Do not drive yourself to the hospital. Summary Benign prostatic hyperplasia (BPH) is an enlarged prostate that is caused by the normal aging process and not by cancer. An enlarged prostate can press on the urethra. This can make it hard to pass urine. This  condition is part of a normal aging process and is more likely to develop in men over the age of 50 years. Get help right away if you suddenly cannot urinate. This information is not intended to replace advice given to you by your health care provider. Make sure you discuss any questions you have with your healthcare provider. Document Revised: 01/13/2020 Document Reviewed: 01/13/2020 Elsevier Patient Education  2022 [employer]  

## 2020-11-29 NOTE — Progress Notes (Signed)
Urological Symptom Review  Patient is experiencing the following symptoms: Burning/pain with urination   Review of Systems  Gastrointestinal (upper)  : Negative for upper GI symptoms  Gastrointestinal (lower) : Negative for lower GI symptoms  Constitutional : Weight loss  Skin: Skin rash/lesion  Eyes: Negative for eye symptoms  Ear/Nose/Throat : Negative for Ear/Nose/Throat symptoms  Hematologic/Lymphatic: Negative for Hematologic/Lymphatic symptoms  Cardiovascular : Negative for cardiovascular symptoms  Respiratory : Negative for respiratory symptoms  Endocrine: Negative for endocrine symptoms  Musculoskeletal: Back pain  Neurological: Negative for neurological symptoms  Psychologic: Negative for psychiatric symptoms

## 2020-11-29 NOTE — Progress Notes (Signed)
11/29/2020 9:59 AM   [name] [birth year] [id number]  Referring provider: Tobe Sos, MD [address] [place] [address]  Followup BPh and scrotal lesions   HPI: [name] is a 70yo here for followup for BPH and a scrotal lesions. He is urinating well since Urolift. IPSS 8 QOL 1. NO dysuria. He does have urinary urgency which is improving. He scrotal lesions started spontaneously draining last week. The lesion is now very small and the pain has resolved. No fevers. No other complaints today   PMH: Past Medical History:  Diagnosis Date   AAA (abdominal aortic aneurysm) ([place]) 06/2016   Mild increase in size of 3.4 cm infrarenal abdominal aortic aneurysm   Anemia    Aortic atherosclerosis (HCC)    Arthritis    Asthma    Atrial flutter (HCC)    AVF (arteriovenous fistula) (HCC)    Left forearm   CAD (coronary artery disease)    a. s/p prior stenting of RCA in 1999 b. cath in 2003 showing moderate CAD c. NST in 2016 showing small area of ischemic and low-risk   CHF (congestive heart failure) ([place])    CKD (chronic kidney disease), stage V ([place])    kidney transplant evaluation   COPD (chronic obstructive pulmonary disease) ([place])    with ongoing tobacco use and patient failed sham takes   Diverticulosis 2018   Mild sigmoid colon diverticulosis.   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    Reflux esophagitis    hx   Two-vessel coronary artery disease    moderate. by cath in 2003. Status  post stenting of the mdi RCA November 1999 normal left ventricular ejection fraction   Wears glasses    Wears hearing aid    B/L    Surgical History: Past Surgical History:  Procedure Laterality Date   A/V FISTULAGRAM Left 04/30/2019   Procedure: A/V FISTULAGRAM;  Surgeon: Elam Dutch, MD;  Location: [facility];  Service: Cardiovascular;  Laterality: Left;   A/V FISTULAGRAM Left 09/22/2019   Procedure: A/V FISTULAGRAM;  Surgeon: Marty Heck, MD;  Location: [facility];  Service: Cardiovascular;  Laterality: Left;   ABDOMINAL SURGERY     APPENDECTOMY     AV FISTULA PLACEMENT Right 03/11/2018   Procedure: CREATION Brachiocephalic Fistula RIGHT ARM;  Surgeon: Rosetta Posner, MD;  Location: [facility];  Service: Vascular;  Laterality: Right;   AV FISTULA PLACEMENT Left 05/06/2019   Procedure: LEFT BRACHIOCEPHALIC ARTERIOVENOUS (AV) FISTULA CREATION;  Surgeon: Marty Heck, MD;  Location: [facility];  Service: Vascular;  Laterality: Left;   BACK SURGERY     x3; cages in patient's back since 2000   BIOPSY  11/12/2018   Procedure: BIOPSY;  Surgeon: Laurence Spates, MD;  Location: [facility];  Service: Endoscopy;;   CARDIAC CATHETERIZATION     stent   CHOLECYSTECTOMY  CLOSED REDUCTION SHOULDER DISLOCATION     COLONOSCOPY W/ BIOPSIES AND POLYPECTOMY     COLONOSCOPY WITH PROPOFOL N/A 11/12/2018   Procedure: COLONOSCOPY WITH PROPOFOL;  Surgeon: Laurence Spates, MD;  [facility]: [facility];  Service: Endoscopy;  Laterality: N/A;   CORONARY ANGIOPLASTY     CYSTOSCOPY WITH INSERTION OF UROLIFT N/A 11/06/2020   Procedure: CYSTOSCOPY WITH INSERTION OF UROLIFT;  Surgeon: Cleon Gustin, MD;  Location: [facility];  Service: Urology;  Laterality: N/A;   diabetic ulcers     DIAGNOSTIC LAPAROSCOPY     DIALYSIS/PERMA CATHETER REMOVAL N/A 09/22/2019   Procedure: DIALYSIS/PERMA CATHETER REMOVAL;  Surgeon: Marty Heck, MD;  Location: [facility];  Service:  Cardiovascular;  Laterality: N/A;   ESOPHAGOGASTRODUODENOSCOPY (EGD) WITH PROPOFOL N/A 11/12/2018   Procedure: ESOPHAGOGASTRODUODENOSCOPY (EGD) WITH PROPOFOL;  Surgeon: Laurence Spates, MD;  [facility];  Service: Endoscopy;  Laterality: N/A;   GROIN EXPLORATION     HEMOSTASIS CLIP PLACEMENT  11/12/2018   Procedure: HEMOSTASIS CLIP PLACEMENT;  Surgeon: Laurence Spates, MD;  [facility];  Service: Endoscopy;;   HIP ARTHROPLASTY Left    gamma nail   INSERT / REPLACE / [place] (AV) ARTEGRAFT ARM Left 03/18/2018   Procedure: INSERTION OF ARTERIOVENOUS (AV) FISTULA;  Surgeon: Rosetta Posner, MD;  Location: [facility];  Service: Vascular;  Laterality: Left;   IR FLUORO GUIDE CV LINE RIGHT  04/11/2019   IR US GUIDE VASC ACCESS RIGHT  04/11/2019   LEFT HEART CATH AND CORONARY ANGIOGRAPHY N/A 07/23/2019   Procedure: LEFT HEART CATH AND CORONARY ANGIOGRAPHY;  Surgeon: [place], Peter M, MD;  Location: [facility];  Service: Cardiovascular;  Laterality: N/A;   LIGATION ARTERIOVENOUS GORTEX GRAFT Right 03/18/2018   Procedure: LIGATION ARTERIOVENOUS FISTULA;  Surgeon: Rosetta Posner, MD;  Location: [facility];  Service: Vascular;  Laterality: Right;   OSTECTOMY Left 04/23/2017   Procedure: OSTECTOMY LEFT GREAT TOE;  Surgeon: [name], DPM;  Location[facility];  Service: Podiatry;  Laterality: Left;  left great toe   POLYPECTOMY  11/12/2018   Procedure: POLYPECTOMY;  Surgeon: Laurence Spates, MD;  [facility];  Service: Endoscopy;;   TUMOR REMOVAL     small intestine x3   VISCERAL ANGIOGRAPHY N/A 08/02/2019   Procedure: VISCERAL ANGIOGRAPHY;  Surgeon: Waynetta Sandy, MD;  Location: [facility];  Service: Cardiovascular;  Laterality: N/A;    Home Medications:  Allergies as of 11/29/2020       Reactions   Other    Opiods cause hallucinations and delusions has to be given risperidone to settle down   Requip  [ropinirole] Other (See Comments)   Dry mouth        Medication List        Accurate as of November 29, 2020  9:59 AM. If you have any questions, ask your nurse or doctor.          acidophilus Caps capsule Take 1 capsule by mouth daily.   albuterol 108 (90 Base) MCG/ACT inhaler Commonly known as: VENTOLIN HFA Inhale 2 puffs into the lungs every 6 (six) hours as needed for wheezing or shortness of breath.   aspirin 81 MG EC tablet Take 1 tablet (81 mg total) by mouth daily.   atorvastatin 40 MG tablet Commonly known as: LIPITOR Take 1 tablet by mouth once daily   beclomethasone 80 MCG/ACT inhaler Commonly known as: QVAR Inhale 2 puffs into the  lungs 2 (two) times daily as needed (shortness of breath).   buPROPion 150 MG 12 hr tablet Commonly known as: WELLBUTRIN SR Take 150 mg by mouth at bedtime.   CALCIUM 600 PO Take 600 mg by mouth in the morning and at bedtime.   carvedilol 25 MG tablet Commonly known as: COREG Take 1 tablet (25 mg total) by mouth See admin instructions. Take 25 mg twice daily on Sun, Mon, Wed, and Fri. Take 25 mg in the evening on Tues, Thurs, and Sat (dialysis days) What changed:  when to take this additional instructions   cephALEXin 250 MG capsule Commonly known as: Keflex Take 1 capsule (250 mg total) by mouth daily.   clotrimazole 1 % cream Commonly known as: LOTRIMIN Apply 1 application topically 2 (two) times daily as needed (jock itch).   cyproheptadine 4 MG tablet Commonly known as: PERIACTIN Take 4 mg by mouth 3 (three) times daily as needed for allergies.   denosumab 60 MG/ML Sosy injection Commonly known as: PROLIA Inject 60 mg into the skin every 6 (six) months.   lactulose 10 GM/15ML solution Commonly known as: CHRONULAC Take 20 g by mouth 2 (two) times daily as needed for mild constipation.   linaclotide 290 MCG Caps capsule Commonly known as: LINZESS Take 290 mcg by mouth daily as needed (constipation).    magnesium oxide 400 MG tablet Commonly known as: MAG-OX Take 400 mg by mouth daily.   Melatonin 10 MG Tabs Take 10 mg by mouth at bedtime as needed (sleep).   multivitamin Tabs tablet Take 1 tablet by mouth daily.   nitrofurantoin (macrocrystal-monohydrate) 100 MG capsule Commonly known as: MACROBID Take 1 capsule (100 mg total) by mouth at bedtime.   nystatin-triamcinolone ointment Commonly known as: MYCOLOG Apply 1 application topically 2 (two) times daily.   omeprazole 20 MG capsule Commonly known as: PRILOSEC Take 20 mg by mouth daily as needed (heartburn).   ondansetron 4 MG tablet Commonly known as: Zofran Take 1 tablet (4 mg total) by mouth every 8 (eight) hours as needed for nausea or vomiting.   polyethylene glycol 17 g packet Commonly known as: MIRALAX / GLYCOLAX Take 17 g by mouth daily as needed for mild constipation.   Potassium Chloride ER 20 MEQ Tbcr Take 20 mEq by mouth daily.   risperiDONE 0.5 MG tablet Commonly known as: RISPERDAL Take 0.5 mg by mouth at bedtime.   Sennosides 25 MG Tabs Take 50 mg by mouth daily as needed (constipation).   sertraline 50 MG tablet Commonly known as: ZOLOFT Take 100 mg by mouth in the morning.   tamsulosin 0.4 MG Caps capsule Commonly known as: FLOMAX Take 1 capsule (0.4 mg total) by mouth daily after supper.   tiZANidine 2 MG tablet Commonly known as: ZANAFLEX Take 2 mg by mouth at bedtime.   tolterodine 4 MG 24 hr capsule Commonly known as: Detrol LA Take 1 capsule (4 mg total) by mouth daily.   traMADol 50 MG tablet Commonly known as: ULTRAM Take 50 mg by mouth every 6 (six) hours as needed for moderate pain.   traMADol 50 MG tablet Commonly known as: Ultram Take 1 tablet (50 mg total) by mouth every 6 (six) hours as needed.   Vitamin D3 50 MCG (2000 UT) Tabs Take 2,000 Units by mouth daily.        Allergies:  Allergies  Allergen Reactions   Other     Opiods cause hallucinations and  delusions has to be given  risperidone to settle down   Requip [Ropinirole] Other (See Comments)    Dry mouth    Family History: Family History  Problem Relation Age of Onset   Diabetes Mother    Alcohol abuse Father     Social History:  reports that he quit smoking about a year ago. His smoking use included cigarettes. He started smoking about 52 years ago. He has never used smokeless tobacco. He reports previous alcohol use. He reports that he does not use drugs.  ROS: All other review of systems were reviewed and are negative except what is noted above in HPI  Physical Exam: BP (!) 87/58   Pulse 82   Temp 98 F (36.7 C)   Constitutional:  Alert and oriented, No acute distress. HEENT: [place] AT, moist mucus membranes.  Trachea midline, no masses. Cardiovascular: No clubbing, cyanosis, or edema. Respiratory: Normal respiratory effort, no increased work of breathing. GI: Abdomen is soft, nontender, nondistended, no abdominal masses GU: No CVA tenderness.  Lymph: No cervical or inguinal lymphadenopathy. Skin: No rashes, bruises or suspicious lesions. Neurologic: Grossly intact, no focal deficits, moving all 4 extremities. Psychiatric: Normal mood and affect.  Laboratory Data: Lab Results  Component Value Date   WBC 7.0 10/10/2020   HGB 14.6 11/06/2020   HCT 43.0 11/06/2020   MCV 108.6 (H) 10/10/2020   PLT 139 (L) 10/10/2020    Lab Results  Component Value Date   CREATININE 5.00 (H) 11/06/2020    No results found for: PSA  Lab Results  Component Value Date   TESTOSTERONE 297 07/28/2015    Lab Results  Component Value Date   HGBA1C 5.6 10/08/2020    Urinalysis    Component Value Date/Time   COLORURINE YELLOW 07/22/2019 1625   APPEARANCEUR Hazy (A) 09/25/2020 1149   LABSPEC 1.010 07/22/2019 1625   PHURINE 6.0 07/22/2019 1625   GLUCOSEU Negative 09/25/2020 1149   HGBUR NEGATIVE 07/22/2019 1625   BILIRUBINUR Negative 09/25/2020 1149   KETONESUR NEGATIVE  07/22/2019 1625   PROTEINUR 2+ (A) 09/25/2020 1149   PROTEINUR >=300 (A) 07/22/2019 1625   UROBILINOGEN 0.2 09/27/2014 0027   NITRITE Negative 09/25/2020 1149   NITRITE NEGATIVE 07/22/2019 1625   LEUKOCYTESUR 1+ (A) 09/25/2020 1149   LEUKOCYTESUR NEGATIVE 07/22/2019 1625    Lab Results  Component Value Date   LABMICR See below: 09/25/2020   WBCUA 11-30 (A) 09/25/2020   LABEPIT 0-10 09/25/2020   MUCUS Present 09/11/2020   BACTERIA Few (A) 09/25/2020    Pertinent Imaging:  No results found for this or any previous visit.  No results found for this or any previous visit.  No results found for this or any previous visit.  No results found for this or any previous visit.  Results for orders placed in visit on 09/15/20  US RENAL  Narrative CLINICAL DATA:  Recurrent urinary tract infection.  EXAM: RENAL / URINARY TRACT ULTRASOUND COMPLETE  COMPARISON:  Renal ultrasound 04/05/2016.  Abdominal CT 07/28/2019.  FINDINGS: Right Kidney:  Renal measurements: 10.3 x 5.1 x 4.0 cm = volume: 109.8 mL. Mild renal cortical thinning and increased echogenicity. There is a cystic lesion in the upper pole which measures up to 1.8 cm. Prominent renal sinus fat. No hydronephrosis or suspicious cortical lesion.  Left Kidney:  Renal measurements: 10.5 x 6.2 x 4.2 cm = volume: 140.9 mL. Mild renal cortical thinning and increased echogenicity. Prominent renal sinus fat. No hydronephrosis or focal cortical lesion.  Bladder:  Not visualized.  Other:  Study is mildly limited by body habitus.  No evidence of ascites.  IMPRESSION: 1. Both kidneys demonstrate mild cortical thinning and increased cortical echogenicity consistent with "chronic medical renal disease". 2. Small cyst in the upper pole of the right kidney. No hydronephrosis. 3. The bladder was not visualized.   Electronically Signed By: Richardean Sale M.D. On: 09/18/2020 12:11  No results found for this or any  previous visit.  No results found for this or any previous visit.  No results found for this or any previous visit.   Assessment & Plan:    1. Incomplete emptying of bladder -resolved - BLADDER SCAN AMB NON-IMAGING  2. Benign prostatic hyperplasia with urinary obstruction -wean alpha blocker over the next 2 weeks - tolterodine (DETROL LA) 4 MG 24 hr capsule; Take 1 capsule (4 mg total) by mouth daily.  Dispense: 30 capsule; Refill: 1  3. Scrotal lesion -The patient wishes to pursue observation - nitrofurantoin, macrocrystal-monohydrate, (MACROBID) 100 MG capsule; Take 1 capsule (100 mg total) by mouth at bedtime.  Dispense: 30 capsule; Refill: 3   No follow-ups on file.  Nicolette Bang, MD  [facility] [place]

## 2020-11-29 NOTE — Progress Notes (Signed)
post void residual=344

## 2020-12-04 ENCOUNTER — Ambulatory Visit (INDEPENDENT_AMBULATORY_CARE_PROVIDER_SITE_OTHER): Payer: Medicare Other

## 2020-12-04 DIAGNOSIS — I495 Sick sinus syndrome: Secondary | ICD-10-CM

## 2020-12-05 LAB — CUP PACEART REMOTE DEVICE CHECK
Battery Remaining Longevity: 156 mo
Battery Remaining Percentage: 100 %
Brady Statistic RA Percent Paced: 26 %
Brady Statistic RV Percent Paced: 2 %
Date Time Interrogation Session: 20220718044000
Implantable Lead Implant Date: 20190514
Implantable Lead Implant Date: 20190514
Implantable Lead Location: 753859
Implantable Lead Location: 753860
Implantable Lead Model: 7741
Implantable Lead Model: 7742
Implantable Lead Serial Number: 1015225
Implantable Lead Serial Number: 1020907
Implantable Pulse Generator Implant Date: 20190514
Lead Channel Impedance Value: 1113 Ohm
Lead Channel Impedance Value: 840 Ohm
Lead Channel Pacing Threshold Amplitude: 1.2 V
Lead Channel Pacing Threshold Pulse Width: 0.4 ms
Lead Channel Setting Pacing Amplitude: 2 V
Lead Channel Setting Pacing Amplitude: 2.5 V
Lead Channel Setting Pacing Pulse Width: 0.4 ms
Lead Channel Setting Sensing Sensitivity: 1.5 mV
Pulse Gen Serial Number: 832937

## 2020-12-11 ENCOUNTER — Other Ambulatory Visit: Payer: Self-pay | Admitting: Pain Medicine

## 2020-12-14 ENCOUNTER — Other Ambulatory Visit (HOSPITAL_BASED_OUTPATIENT_CLINIC_OR_DEPARTMENT_OTHER): Payer: Self-pay

## 2020-12-14 NOTE — Progress Notes (Signed)
Electrophysiology Office Note:    Date:  12/15/2020   ID:  DOMONICK SITTNER, DOB 1951-03-22, MRN [id number]  PCP:  Tobe Sos, MD  [address] [facility] Cardiologist:  Carlyle Dolly, MD  [facility] Electrophysiologist:  Thompson Grayer, MD   Referring MD: Tobe Sos, MD   Chief Complaint: AF  History of Present Illness:    [name] is a 70 y.o. male who presents for an evaluation of paroxysmal AF at the request of Dr Rayann Heman. Their medical history includes AAA, AFL, ESRD on dialysis, CAD, COPD, HTN, obesity, symptomatic bradycardia with PPM. He has a back injury for which he has chronic pain and is preparing for a spinal stimulator implant.   He last saw Dr Rayann Heman 10/27/2020. At that appointment, Dr Rayann Heman reported several hospitalizations. His AF burden is low. With his history of rectal bleeding, the patient does not take anticoagulation.   Past Medical History:  Diagnosis Date   AAA (abdominal aortic aneurysm) ([place]) 06/2016   Mild increase in size of 3.4 cm infrarenal abdominal aortic aneurysm   Anemia    Aortic atherosclerosis (HCC)    Arthritis    Asthma    Atrial flutter (HCC)    AVF (arteriovenous fistula) (HCC)    Left forearm   CAD (coronary artery disease)    a. s/p prior stenting of RCA in 1999 b. cath in 2003 showing moderate CAD c. NST in 2016 showing small area of ischemic and low-risk   CHF (congestive heart failure) ([place])    CKD (chronic kidney disease), stage V ([place])    kidney transplant evaluation   COPD (chronic obstructive pulmonary disease) ([place])    with ongoing tobacco use and patient failed sham takes   Diverticulosis 2018   Mild sigmoid colon diverticulosis.   DM2 (diabetes mellitus, type 2) ([place])    Dyslipidemia    Dysrhythmia 2018   atrial fibrillation   Edema    chronic lower extremity secondary to right heart failure and chronic venous insufficiency   Fatigue    Fatty liver 2010   Mild   Headache    HTN (hypertension)     Morbid obesity (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    Reflux esophagitis    hx   Two-vessel coronary artery disease    moderate. by cath in 2003. Status post stenting of the mdi RCA November 1999 normal left ventricular ejection fraction   Wears glasses    Wears hearing aid    B/L    Past Surgical History:  Procedure Laterality Date   A/V FISTULAGRAM Left 04/30/2019   Procedure: A/V FISTULAGRAM;  Surgeon: Elam Dutch, MD;  Location: [place] [facility];  Service: Cardiovascular;  Laterality: Left;   A/V FISTULAGRAM Left 09/22/2019   Procedure: A/V FISTULAGRAM;  Surgeon: Marty Heck, MD;  Location: [place] [facility];  Service: Cardiovascular;  Laterality: Left;   ABDOMINAL SURGERY     APPENDECTOMY     AV FISTULA PLACEMENT Right 03/11/2018   Procedure: CREATION Brachiocephalic Fistula RIGHT ARM;  Surgeon: Rosetta Posner, MD;  Location: [place];  Service: Vascular;  Laterality: Right;   AV FISTULA PLACEMENT Left 05/06/2019   Procedure: LEFT BRACHIOCEPHALIC ARTERIOVENOUS (AV) FISTULA CREATION;  Surgeon: Marty Heck, MD;  Location: [place] OR;  Service: Vascular;  Laterality: Left;   BACK SURGERY     x3; cages in patient's back since 2000   BIOPSY  11/12/2018   Procedure: BIOPSY;  Surgeon: Laurence Spates, MD;  [facility];  Service: Endoscopy;;   CARDIAC CATHETERIZATION     stent   CHOLECYSTECTOMY     CLOSED REDUCTION SHOULDER DISLOCATION     COLONOSCOPY W/ BIOPSIES AND POLYPECTOMY     COLONOSCOPY WITH PROPOFOL N/A 11/12/2018   Procedure: COLONOSCOPY WITH PROPOFOL;  Surgeon: Laurence Spates, MD;  [facility];  Service: Endoscopy;  Laterality: N/A;   CORONARY ANGIOPLASTY     CYSTOSCOPY WITH INSERTION OF UROLIFT N/A 11/06/2020   Procedure: CYSTOSCOPY WITH  INSERTION OF UROLIFT;  Surgeon: Cleon Gustin, MD;  [facility];  Service: Urology;  Laterality: N/A;   diabetic ulcers     DIAGNOSTIC LAPAROSCOPY     DIALYSIS/PERMA CATHETER REMOVAL N/A 09/22/2019   Procedure: DIALYSIS/PERMA CATHETER REMOVAL;  Surgeon: Marty Heck, MD;  Location: [place] [facility];  Service: Cardiovascular;  Laterality: N/A;   ESOPHAGOGASTRODUODENOSCOPY (EGD) WITH PROPOFOL N/A 11/12/2018   Procedure: ESOPHAGOGASTRODUODENOSCOPY (EGD) WITH PROPOFOL;  Surgeon: Laurence Spates, MD;  [facility];  Service: Endoscopy;  Laterality: N/A;   GROIN EXPLORATION     HEMOSTASIS CLIP PLACEMENT  11/12/2018   Procedure: HEMOSTASIS CLIP PLACEMENT;  Surgeon: Laurence Spates, MD;  [facility];  Service: Endoscopy;;   HIP ARTHROPLASTY Left    gamma nail   INSERT / REPLACE / [place] (AV) ARTEGRAFT ARM Left 03/18/2018   Procedure: INSERTION OF ARTERIOVENOUS (AV) FISTULA;  Surgeon: Rosetta Posner, MD;  Location: [place];  Service: Vascular;  Laterality: Left;   IR FLUORO GUIDE CV LINE RIGHT  04/11/2019   IR US GUIDE VASC ACCESS RIGHT  04/11/2019   LEFT HEART CATH AND CORONARY ANGIOGRAPHY N/A 07/23/2019   Procedure: LEFT HEART CATH AND CORONARY ANGIOGRAPHY;  Surgeon: Martinique, Peter M, MD;  Location: [place] [facility];  Service: Cardiovascular;  Laterality: N/A;   LIGATION ARTERIOVENOUS GORTEX GRAFT Right 03/18/2018   Procedure: LIGATION ARTERIOVENOUS FISTULA;  Surgeon: Rosetta Posner, MD;  Location: [facility];  Service: Vascular;  Laterality: Right;   OSTECTOMY Left 04/23/2017   Procedure: OSTECTOMY LEFT GREAT TOE;  Surgeon: [name], DPM;  [facility];  Service: Podiatry;  Laterality: Left;  left great toe   POLYPECTOMY  11/12/2018   Procedure: POLYPECTOMY;  Surgeon: Laurence Spates, MD;  [facility];  Service: Endoscopy;;   TUMOR REMOVAL     small intestine x3   VISCERAL ANGIOGRAPHY N/A 08/02/2019    Procedure: VISCERAL ANGIOGRAPHY;  Surgeon: Waynetta Sandy, MD;  Location: [place] [facility];  Service: Cardiovascular;  Laterality: N/A;    Current Medications: Current Meds  Medication Sig   albuterol (VENTOLIN HFA) 108 (90 Base) MCG/ACT inhaler Inhale 2 puffs into the lungs every 6 (six) hours as needed for wheezing or shortness of breath.    aspirin EC 81 MG EC tablet Take 1 tablet (81 mg total) by mouth daily.   atorvastatin (LIPITOR) 40 MG tablet Take 1 tablet by mouth once daily (Patient taking differently: Take 40 mg by mouth daily.)   beclomethasone (QVAR) 80 MCG/ACT inhaler Inhale 2 puffs into the lungs in the morning and at bedtime.   CALCIUM CITRATE PO Take 600 mg by mouth daily.   carvedilol (COREG) 25 MG tablet Take 25  mg by mouth 2 (two) times daily with a meal.   Cholecalciferol (VITAMIN D3) 2000 units TABS Take 2,000 Units by mouth daily.    clobetasol (TEMOVATE) 0.05 % external solution Apply 1 application topically 2 (two) times daily as needed (itching).   CYPROHEPTADINE HCL PO Take 2 mg by mouth daily.   denosumab (PROLIA) 60 MG/ML SOSY injection Inject 60 mg into the skin every 6 (six) months.   lactulose (CHRONULAC) 10 GM/15ML solution Take 20 g by mouth 2 (two) times daily as needed for mild constipation.   linaclotide (LINZESS) 290 MCG CAPS capsule Take 290 mcg by mouth daily as needed (constipation).   magnesium oxide (MAG-OX) 400 MG tablet Take 400 mg by mouth daily.   Melatonin 10 MG TABS Take 20 mg by mouth at bedtime.   multivitamin (RENA-VIT) TABS tablet Take 1 tablet by mouth daily.   nitrofurantoin, macrocrystal-monohydrate, (MACROBID) 100 MG capsule Take 1 capsule (100 mg total) by mouth at bedtime.   nitroGLYCERIN (NITROSTAT) 0.4 MG SL tablet Place 0.4 mg under the tongue every 5 (five) minutes as needed for chest pain.   nystatin-triamcinolone ointment (MYCOLOG) Apply 1 application topically 2 (two) times daily.   omeprazole (PRILOSEC) 20 MG  capsule Take 20 mg by mouth daily.   ondansetron (ZOFRAN) 4 MG tablet Take 1 tablet (4 mg total) by mouth every 8 (eight) hours as needed for nausea or vomiting.   polyethylene glycol (MIRALAX / GLYCOLAX) packet Take 17 g by mouth daily as needed for mild constipation.   Potassium Chloride ER 20 MEQ TBCR Take 20 mEq by mouth daily.   risperiDONE (RISPERDAL) 0.5 MG tablet Take 0.5 mg by mouth at bedtime.   senna-docusate (SENOKOT-S) 8.6-50 MG tablet Take 2 tablets by mouth at bedtime.   sertraline (ZOLOFT) 100 MG tablet Take 100 mg by mouth in the morning.   tiZANidine (ZANAFLEX) 2 MG tablet Take 2 mg by mouth every 6 (six) hours as needed for muscle spasms.   tolterodine (DETROL LA) 4 MG 24 hr capsule Take 1 capsule (4 mg total) by mouth daily.   traMADol (ULTRAM) 50 MG tablet Take 1 tablet (50 mg total) by mouth every 6 (six) hours as needed.     Allergies:   Other and Requip [ropinirole]   Social History   Socioeconomic History   Marital status: Married    Spouse name: Not on file   Number of children: 2   Years of education: Not on file   Highest education level: Not on file  Occupational History   Occupation: retired Engineer, structural  Tobacco Use   Smoking status: Former    Years: 40.00    Types: Cigarettes    Start date: 05/20/1968    Quit date: 11/30/2019    Years since quitting: 1.0   Smokeless tobacco: Never  Vaping Use   Vaping Use: Never used  Substance and Sexual Activity   Alcohol use: Not Currently    Alcohol/week: 0.0 standard drinks    Comment: rare   Drug use: No   Sexual activity: Yes  Other Topics Concern   Not on file  Social History Narrative   Patient continues to smoke.    Social Determinants of Health   Financial Resource Strain: Not on file  Food Insecurity: Not on file  Transportation Needs: Not on file  Physical Activity: Not on file  Stress: Not on file  Social Connections: Not on file     Family History: The patient's family history  includes Alcohol abuse in his father; Diabetes in his mother.  ROS:   Please see the history of present illness.    All other systems reviewed and are negative.  EKGs/Labs/Other Studies Reviewed:    The following studies were reviewed today: Prior notes   12/15/2020 Device interrogation personally reviewed 0% AT/AF burden Battery longevity 12.5 years RV threshold slightly high at 1.4 @ 0.4, sensing low at 2.52mV 27% A pace 6% V pace  EKG:  The ekg ordered today demonstrates sinus rhythm  Recent Labs: 10/08/2020: ALT 68 10/10/2020: Platelets 139 11/06/2020: BUN 43; Creatinine, Ser 5.00; Hemoglobin 14.6; Potassium 4.3; Sodium 136  Recent Lipid Panel    Component Value Date/Time   CHOL 63 10/10/2020 0237   TRIG 64 10/10/2020 0237   HDL 34 (L) 10/10/2020 0237   CHOLHDL 1.9 10/10/2020 0237   VLDL 13 10/10/2020 0237   LDLCALC 16 10/10/2020 0237    Physical Exam:    VS:  BP 100/66   Pulse 64   Ht 6\' 4"  (1.93 m)   Wt 211 lb (95.7 kg)   SpO2 98%   BMI 25.68 kg/m     Wt Readings from Last 3 Encounters:  12/15/20 211 lb (95.7 kg)  11/14/20 216 lb (98 kg)  10/27/20 216 lb (98 kg)     GEN:  chronically ill appearing, in wheelchair HEENT: Normal NECK: JVD elevated to angle of jaw; No carotid bruits LYMPHATICS: No lymphadenopathy CARDIAC: RRR, no murmurs, rubs, gallops RESPIRATORY:  Clear to auscultation without rales, wheezing or rhonchi  ABDOMEN: Soft, non-tender, non-distended MUSCULOSKELETAL:  No edema; No deformity  SKIN: Warm and dry. Wound noted on LLE with bandage. NEUROLOGIC:  Alert and oriented x 3 PSYCHIATRIC:  Normal affect   ASSESSMENT:    1. Symptomatic sinus bradycardia   2. Bradycardia with 31-40 beats per minute   3. PAF (paroxysmal atrial fibrillation) ([place])   4. Chronic diastolic CHF (congestive heart failure) ([place])   5. ESRD (end stage renal disease) ([place])    PLAN:    In order of problems listed above:  1. Symptomatic sinus  bradycardia Pacemaker functioning well today.  2. PAF (paroxysmal atrial fibrillation) [facility]) Mr Ashworth was referred to discuss the possibility of using the Watchman device to help reduce the stroke risk associated with his atrial fibrillation. We discussed atrial fibrillation, anticoagulation, LAAO during today's appointment. We discussed recent trial data that suggests CKD and ESRD increase the mortality rates significantly with Watchman implant.  Because of his debility and ESRD, I do not think Mr Haymer is a candidate for the Watchman procedure. I would recommend he continue taking his Aspirin 81mg  PO daily.    3. Chronic diastolic CHF (congestive heart failure) (HCC) Fluid managed with PD. Continue coreg. BP is controlled today. Warm on exam.  4. ESRD (end stage renal disease) ([place])   Total time spent with patient today 45 minutes. This includes reviewing records, evaluating the patient and coordinating care.  Medication Adjustments/Labs and Tests Ordered: Current medicines are reviewed at length with the patient today.  Concerns regarding medicines are outlined above.  Orders Placed This Encounter  Procedures   EKG 12-Lead   No orders of the defined types were placed in this encounter.    Signed, Hilton Cork. Quentin Ore, MD, [facility], [facility] 12/15/2020 8:43 PM    Electrophysiology [facility]

## 2020-12-15 ENCOUNTER — Telehealth: Payer: Self-pay | Admitting: Cardiology

## 2020-12-15 ENCOUNTER — Other Ambulatory Visit: Payer: Self-pay

## 2020-12-15 ENCOUNTER — Ambulatory Visit (INDEPENDENT_AMBULATORY_CARE_PROVIDER_SITE_OTHER): Payer: Medicare Other | Admitting: Cardiology

## 2020-12-15 ENCOUNTER — Encounter: Payer: Self-pay | Admitting: Cardiology

## 2020-12-15 ENCOUNTER — Encounter: Payer: Self-pay | Admitting: Internal Medicine

## 2020-12-15 VITALS — BP 100/66 | HR 64 | Ht 76.0 in | Wt 211.0 lb

## 2020-12-15 DIAGNOSIS — R001 Bradycardia, unspecified: Secondary | ICD-10-CM | POA: Diagnosis not present

## 2020-12-15 DIAGNOSIS — I5032 Chronic diastolic (congestive) heart failure: Secondary | ICD-10-CM

## 2020-12-15 DIAGNOSIS — I480 Paroxysmal atrial fibrillation: Secondary | ICD-10-CM

## 2020-12-15 DIAGNOSIS — N186 End stage renal disease: Secondary | ICD-10-CM | POA: Diagnosis not present

## 2020-12-15 MED ORDER — CARVEDILOL 25 MG PO TABS
25.0000 mg | ORAL_TABLET | ORAL | 2 refills | Status: DC
Start: 2020-12-15 — End: 2020-12-15

## 2020-12-15 NOTE — Telephone Encounter (Signed)
New message      *STAT* If patient is at the pharmacy, call can be transferred to refill team.   1. Which medications need to be refilled? (please list name of each medication and dose if known) carvedilol (COREG) 25 MG tablet  2. Which pharmacy/location (including street and city if local pharmacy) is medication to be sent to? [facility] - [address] - [address]   3. Do they need a 30 day or 90 day supply? [place]

## 2020-12-15 NOTE — Patient Instructions (Addendum)
Medication Instructions:  Your physician recommends that you continue on your current medications as directed. Please refer to the Current Medication list given to you today. *If you need a refill on your cardiac medications before your next appointment, please call your pharmacy*  Lab Work: None ordered. If you have labs (blood work) drawn today and your tests are completely normal, you will receive your results only by: [place] (if you have MyChart) OR A paper copy in the mail If you have any lab test that is abnormal or we need to change your treatment, we will call you to review the results.  Testing/Procedures: None ordered.  Follow-Up: At [facility], you and your health needs are our priority.  As part of our continuing mission to provide you with exceptional heart care, we have created designated Provider Care Teams.  These Care Teams include your primary Cardiologist (physician) and Advanced Practice Providers (APPs -  Physician Assistants and Nurse Practitioners) who all work together to provide you with the care you need, when you need it.  Your next appointment:   Your physician wants you to follow-up in: as needed with Vickie Epley, MD   Remote monitoring is used to monitor your Pacemaker from home. This monitoring reduces the number of office visits required to check your device to one time per year. It allows [place] to keep an eye on the functioning of your device to ensure it is working properly. You are scheduled for a device check from home on 03/05/2021. You may send your transmission at any time that day. If you have a wireless device, the transmission will be sent automatically. After your physician reviews your transmission, you will receive a postcard with your next transmission date.

## 2020-12-15 NOTE — Progress Notes (Signed)
[facility] DEVICE PROGRAMMING  Patient Information: Name:  [name]  DOB:  [birth year]  MRN:  [id number]    Patient: [name]  Planned Procedure:  spinal cord stimulator  Surgeon:  Dr. Lenord Carbo  Date of Procedure:  12/20/20  Cautery will be used.  Position during surgery:  Surgery Time: [place] Time: 0900   Please send documentation back to:  [facility] (Fax # [phone])   Device Information:  Clinic EP Physician:  Thompson Grayer, MD   Device Type:  Pacemaker Manufacturer and Phone #:  [employer]: [phone] Pacemaker Dependent?:  No. Date of Last Device Check:  12/04/20 (remote) Normal Device Function?:  Yes.    Electrophysiologist's Recommendations:  Have magnet available. Provide continuous ECG monitoring when magnet is used or reprogramming is to be performed.  Procedure will likely interfere with device function.  Device should be programmed:  Asynchronous pacing during procedure and returned to normal programming after procedure Spoke to [place] from [employer], he is aware of patient and case and will be present he stated.   Per Device Clinic Standing Orders, Simone Curia, RN  10:21 AM 12/15/2020

## 2020-12-15 NOTE — Progress Notes (Signed)
Called [name] at [facility] regarding PPM. He is aware of surgery and arrival time.  Notified device clinic.

## 2020-12-15 NOTE — Telephone Encounter (Signed)
Done

## 2020-12-18 ENCOUNTER — Encounter (HOSPITAL_COMMUNITY)
Admission: RE | Admit: 2020-12-18 | Discharge: 2020-12-18 | Disposition: A | Discharge status: Home / Self Care | Payer: Medicare Other | Source: Ambulatory Visit | Attending: Pain Medicine | Admitting: Pain Medicine

## 2020-12-18 ENCOUNTER — Other Ambulatory Visit (HOSPITAL_COMMUNITY): Payer: Medicare Other

## 2020-12-18 ENCOUNTER — Encounter (HOSPITAL_COMMUNITY): Payer: Self-pay

## 2020-12-18 ENCOUNTER — Other Ambulatory Visit: Payer: Self-pay

## 2020-12-18 DIAGNOSIS — M48061 Spinal stenosis, lumbar region without neurogenic claudication: Secondary | ICD-10-CM | POA: Diagnosis not present

## 2020-12-18 DIAGNOSIS — I132 Hypertensive heart and chronic kidney disease with heart failure and with stage 5 chronic kidney disease, or end stage renal disease: Secondary | ICD-10-CM | POA: Diagnosis not present

## 2020-12-18 DIAGNOSIS — Z01818 Encounter for other preprocedural examination: Secondary | ICD-10-CM | POA: Diagnosis present

## 2020-12-18 DIAGNOSIS — N186 End stage renal disease: Secondary | ICD-10-CM | POA: Insufficient documentation

## 2020-12-18 DIAGNOSIS — Z87891 Personal history of nicotine dependence: Secondary | ICD-10-CM | POA: Insufficient documentation

## 2020-12-18 DIAGNOSIS — Z992 Dependence on renal dialysis: Secondary | ICD-10-CM | POA: Insufficient documentation

## 2020-12-18 DIAGNOSIS — Z79899 Other long term (current) drug therapy: Secondary | ICD-10-CM | POA: Insufficient documentation

## 2020-12-18 DIAGNOSIS — I2510 Atherosclerotic heart disease of native coronary artery without angina pectoris: Secondary | ICD-10-CM | POA: Insufficient documentation

## 2020-12-18 DIAGNOSIS — Z950 Presence of cardiac pacemaker: Secondary | ICD-10-CM | POA: Insufficient documentation

## 2020-12-18 DIAGNOSIS — J449 Chronic obstructive pulmonary disease, unspecified: Secondary | ICD-10-CM | POA: Diagnosis not present

## 2020-12-18 DIAGNOSIS — E1122 Type 2 diabetes mellitus with diabetic chronic kidney disease: Secondary | ICD-10-CM | POA: Diagnosis not present

## 2020-12-18 DIAGNOSIS — Z7982 Long term (current) use of aspirin: Secondary | ICD-10-CM | POA: Diagnosis not present

## 2020-12-18 DIAGNOSIS — I480 Paroxysmal atrial fibrillation: Secondary | ICD-10-CM | POA: Diagnosis not present

## 2020-12-18 DIAGNOSIS — I5032 Chronic diastolic (congestive) heart failure: Secondary | ICD-10-CM | POA: Insufficient documentation

## 2020-12-18 HISTORY — DX: Acute myocardial infarction, unspecified: I21.9

## 2020-12-18 LAB — CBC
HCT: 43 % (ref 39.0–52.0)
Hemoglobin: 14.3 g/dL (ref 13.0–17.0)
MCH: 36.9 pg — ABNORMAL HIGH (ref 26.0–34.0)
MCHC: 33.3 g/dL (ref 30.0–36.0)
MCV: 110.8 fL — ABNORMAL HIGH (ref 80.0–100.0)
Platelets: 174 10*3/uL (ref 150–400)
RBC: 3.88 MIL/uL — ABNORMAL LOW (ref 4.22–5.81)
RDW: 15.2 % (ref 11.5–15.5)
WBC: 9.9 10*3/uL (ref 4.0–10.5)
nRBC: 0 % (ref 0.0–0.2)

## 2020-12-18 LAB — SURGICAL PCR SCREEN
MRSA, PCR: NEGATIVE
Staphylococcus aureus: NEGATIVE

## 2020-12-18 LAB — HEMOGLOBIN A1C
Hgb A1c MFr Bld: 5.5 % (ref 4.8–5.6)
Mean Plasma Glucose: 111.15 mg/dL

## 2020-12-18 LAB — GLUCOSE, CAPILLARY: Glucose-Capillary: 156 mg/dL — ABNORMAL HIGH (ref 70–99)

## 2020-12-18 NOTE — Progress Notes (Signed)
PCP - Shon Millet, MD Cardiologist - [name], MD  PPM/ICD - pacemaker, [facility] [employer] Device Orders - in Epic and on chart Rep Notified - yes  Chest x-ray - n/a EKG - 12/18/20 in PAT Stress Test - 10/19/20 ECHO - 10/19/20 Cardiac Cath - 07/23/19  Sleep Study - pt denies  Fasting Blood Sugar - pt is not sure, he has not checked his blood sugar "in months"  Blood Thinner Instructions: n/a Aspirin Instructions: pt reports that he called his surgeon's office and was advised to continue his aspirin through day of surgery  ERAS Protcol - yes   COVID TEST- n/a  Anesthesia review: yes, cardiac hx  Patient denies shortness of breath, fever, cough and chest pain at PAT appointment  **pt does hemodialysis at home daily**    All instructions explained to the patient, with a verbal understanding of the material. Patient agrees to go over the instructions while at home for a better understanding. Patient also instructed to self quarantine after being tested for COVID-19. The opportunity to ask questions was provided.

## 2020-12-18 NOTE — Pre-Procedure Instructions (Signed)
Surgical Instructions    Your procedure is scheduled on Wednesday, August 3rd, 2022.  Report to [facility] Main Entrance "A" at 09:00 A.M., then check in with the Admitting office.  Call this number if you have problems the morning of surgery:  [phone]   If you have any questions prior to your surgery date call [phone]: Open Monday-Friday 8am-4pm    Remember:  Do not eat after midnight the night before your surgery  You may drink clear liquids until 08:00 A.M. the morning of your surgery.   Clear liquids allowed are: Water, Non-Citrus Juices (without pulp), Carbonated Beverages, Clear Tea, Black Coffee Only, and Gatorade    Take these medicines the morning of surgery with A SIP OF WATER  atorvastatin (LIPITOR) carvedilol (COREG) omeprazole (PRILOSEC) sertraline (ZOLOFT) CYPROHEPTADINE HCL PO tolterodine (DETROL LA)    Take these medications AS NEEDED: albuterol (VENTOLIN HFA) 108 (90 Base) MCG/ACT inhaler please bring with you to hospital beclomethasone (QVAR) please bring with you to hospital linaclotide (LINZESS)  nitroGLYCERIN (NITROSTAT) - please let your nurse know if you had to use this medication ondansetron (ZOFRAN) tiZANidine (ZANAFLEX) traMADol (ULTRAM)   As of today, STOP taking any Aspirin (unless otherwise instructed by your surgeon) Aleve, Naproxen, Ibuprofen, Motrin, Advil, Goody's, BC's, all herbal medications, fish oil, and all vitamins.           HOW TO MANAGE YOUR DIABETES BEFORE AND AFTER SURGERY  Why is it important to control my blood sugar before and after surgery? Improving blood sugar levels before and after surgery helps healing and can limit problems. A way of improving blood sugar control is eating a healthy diet by:  Eating less sugar and carbohydrates  Increasing activity/exercise  Talking with your doctor about reaching your blood sugar goals High blood sugars (greater than 180 mg/dL) can raise your risk of infections and slow  your recovery, so you will need to focus on controlling your diabetes during the weeks before surgery. Make sure that the doctor who takes care of your diabetes knows about your planned surgery including the date and location.  How do I manage my blood sugar before surgery? Check your blood sugar at least 4 times a day, starting 2 days before surgery, to make sure that the level is not too high or low.  Check your blood sugar the morning of your surgery when you wake up and every 2 hours until you get to the Short Stay unit.  If your blood sugar is less than 70 mg/dL, you will need to treat for low blood sugar: Treat a low blood sugar (less than 70 mg/dL) with  cup of clear juice (cranberry or apple), 4 glucose tablets, OR glucose gel. Recheck blood sugar in 15 minutes after treatment (to make sure it is greater than 70 mg/dL). If your blood sugar is not greater than 70 mg/dL on recheck, call [phone] for further instructions. Report your blood sugar to the short stay nurse when you get to Short Stay.  If you are admitted to the hospital after surgery: Your blood sugar will be checked by the staff and you will probably be given insulin after surgery (instead of oral diabetes medicines) to make sure you have good blood sugar levels. The goal for blood sugar control after surgery is 80-180 mg/dL.  Do not wear jewelry or makeup Do not wear lotions, powders, colognes, or deodorant. Men may shave face and neck. Do not bring valuables to the hospital.  [facility] is not responsible for any belongings or valuables.  Do NOT Smoke (Tobacco/Vaping) or drink Alcohol 24 hours prior to your procedure If you use a CPAP at night, you may bring all equipment for your overnight stay.   Contacts, glasses, dentures or bridgework may not be worn into surgery, please bring cases for these belongings   For patients admitted to the hospital, discharge time will be determined by your  treatment team.   Patients discharged the day of surgery will not be allowed to drive home, and someone needs to stay with them for 24 hours.  ONLY 1 SUPPORT PERSON MAY BE PRESENT WHILE YOU ARE IN SURGERY. IF YOU ARE TO BE ADMITTED ONCE YOU ARE IN YOUR ROOM YOU WILL BE ALLOWED TWO (2) VISITORS.  Minor children may have two parents present. Special consideration for safety and communication needs will be reviewed on a case by case basis.  Special instructions:    Oral Hygiene is also important to reduce your risk of infection.  Remember - BRUSH YOUR TEETH THE MORNING OF SURGERY WITH YOUR REGULAR TOOTHPASTE   [facility]- Preparing For Surgery  Before surgery, you can play an important role. Because skin is not sterile, your skin needs to be as free of germs as possible. You can reduce the number of germs on your skin by washing with CHG (chlorahexidine gluconate) Soap before surgery.  CHG is an antiseptic cleaner which kills germs and bonds with the skin to continue killing germs even after washing.     Please do not use if you have an allergy to CHG or antibacterial soaps. If your skin becomes reddened/irritated stop using the CHG.  Do not shave (including legs and underarms) for at least 48 hours prior to first CHG shower. It is OK to shave your face.  Please follow these instructions carefully.     Shower the NIGHT BEFORE SURGERY and the MORNING OF SURGERY with CHG Soap.   If you chose to wash your hair, wash your hair first as usual with your normal shampoo. After you shampoo, rinse your hair and body thoroughly to remove the shampoo.  Then [employer] and genitals (private parts) with your normal soap and rinse thoroughly to remove soap.  After that Use CHG Soap as you would any other liquid soap. You can apply CHG directly to the skin and wash gently with a scrungie or a clean washcloth.   Apply the CHG Soap to your body ONLY FROM THE NECK DOWN.  Do not use on open wounds or open sores.  Avoid contact with your eyes, ears, mouth and genitals (private parts). Wash Face and genitals (private parts)  with your normal soap.   Wash thoroughly, paying special attention to the area where your surgery will be performed.  Thoroughly rinse your body with warm water from the neck down.  DO NOT shower/wash with your normal soap after using and rinsing off the CHG Soap.  Pat yourself dry with a CLEAN TOWEL.  Wear CLEAN PAJAMAS to bed the night before surgery  Place CLEAN SHEETS on your bed the night before your surgery  DO NOT SLEEP WITH PETS.   Day of Surgery:  Take a shower with CHG soap. Wear Clean/Comfortable clothing the morning of surgery Do not apply any deodorants/lotions.   Remember to brush your teeth WITH YOUR REGULAR TOOTHPASTE.   Please read over the following fact sheets that you were given.

## 2020-12-19 NOTE — Progress Notes (Signed)
Anesthesia Chart Review:  Case: [id number] Date/Time: 12/20/20 0815   Procedure: LUMBAR SPINAL CORD STIMULATOR INSERTION - RM 21   Anesthesia type: Monitor Anesthesia Care   Pre-op diagnosis: Spinal stenosis of lumbar region with neurogenic claudication   Location: [facility] OR ROOM 20 / [place] OR   Surgeons: Reece Agar, MD       DISCUSSION: Patient is a 70 year old male scheduled for the above procedure.  History includes former smoker (quit 11/30/19), COPD, asthma, pulmonary nodules (07/22/19 CTA, stable dating back to 2019 and felt to be benign), ESRD (on home peritoneal dialysis), CAD (RCA stent 1999; NSTEMI 07/2019 with overall complex anatomy and difficult to pursue with PCI, medical therapy), afib/aflutter (PAF with low afib burden 11/2020; off Eliquis since 10/2018 due to anemia/GI bleed), [employer] dual chamber PPM (placed 09/2017 in [place] after syncope with SB and 5 second pause during admission), chronic diastolic CHF, DM2, dyslipidemia, HTN, PVD (Buerger's disease, toe amputations), AAA ( 3.7 cm 09/2020 CT), fatty liver, anemia, BPH (with bladder outlet obstruction, s/p cystoscpoy, UroLift implantation x4 11/06/20).  Last visit with his primary EP cardiologist Dr. Rayann Heman on 10/27/20 for follow-up and preoperative evaluation (surgery not specified).  No CAD symptoms. June 2022 Myoview showed findings consistent with prior study as well as 2021 cath. Continue medical management recommended. He felt patient was "Ok to proceed with surgery if medically indicated though he would be at least moderate risk for any procedures."  In regards to PAF, burden was low by PPM interrogation. Chads2vasc score 3, but currently felt too high risk for long term oral anticoagulation therapy. He referred patient to  EP cardiologist Dr. Quentin Ore for consideration of Watchman device and was seen on 12/15/20. He notes patient is preparing for a spinal cord stimulator implant. Patient has a history of rectal bleeding and did not  want to be on anticoagulation therapy. He discussed with patient that "recent trial data that suggests CKD and ESRD increase the mortality rates significantly with Watchman implant. Because of his debility and ESRD, I do not think Mr Landi is a candidate for the Watchman procedure. I would recommend he continue taking his Aspirin 81mg  PO daily."    Per Dr. Quentin Ore  on 12/15/2020 device interrogation showed: 0% AT/AF burden Battery longevity 12.5 years RV threshold slightly high at 1.4 @ 0.4, sensing low at 2.18mV 27% A pace 6% V pace  PPM perioperative device Rx: Device Information: Device Type:  Estate manager/land agent and Phone #:  [employer]: [phone] Pacemaker Dependent?:  No. Date of Last Device Check:  12/04/20 (remote)        Normal Device Function?:  Yes.     Electrophysiologist's Recommendations: Have magnet available. Provide continuous ECG monitoring when magnet is used or reprogramming is to be performed.  Procedure will likely interfere with device function.  Device should be programmed:  Asynchronous pacing during procedure and returned to normal programming after procedure Spoke to [place] from [employer], he is aware of patient and case and will be present he stated.    He reported instructions to continue ASA per surgeon. He denied SOB and chest pain at PAT RN visit. He had preoperative evaluation with Dr. Rayann Heman in June and subsequently underwent UroLift procedure. CV testing as outlined below with stress and echo in June 2022 with medical therapy recommended. Anesthesia team to evaluate on the day of surgery. For unclear reasons, I don't see a BMET result from 12/18/20 PAT visit, but given ESRD, will need ISTAT  on arrival anyway. His CBC results were acceptable for OR.    VS: BP 124/72   Pulse (!) 56   Temp 36.6 C (Oral)   Resp 19   Ht 6\' 4"  (1.93 m)   Wt 93.9 kg   SpO2 95%   BMI 25.20 kg/m   PROVIDERS: Tobe Sos, MD is PCP  Carlyle Dolly, MD is cardiologist. Last visit 09/01/20.  Thompson Grayer, MD is EP Nicolette Bang, MD is urologist Murlean Iba, MD is nephrologist with [facility]   LABS: PAT labs noted. See DISCUSSION. (all labs ordered are listed, but only abnormal results are displayed)  Labs Reviewed  CBC - Abnormal; Notable for the following components:      Result Value   RBC 3.88 (*)    MCV 110.8 (*)    MCH 36.9 (*)    All other components within normal limits  GLUCOSE, CAPILLARY - Abnormal; Notable for the following components:   Glucose-Capillary 156 (*)    All other components within normal limits  SURGICAL PCR SCREEN  HEMOGLOBIN A1C    OTHER: Overnight pulse oximetry 01/26/18: SpO2 hight 98%, low 89%. Baseline 94%. Wake 94%. Artifact Events 2.67 min. SpO2 < 89% 1 min 4 sec. SpO2 > 90% 10 hr 39 min 16 sec. Results: Not qualified - Group 3. SpO2 did not meet requirements for coverage.   Baseline Polysomnogram 12/22/17: IMPRESSION:  1. Very mild Obstructive Sleep Apnea (OSA), not significant  enough to initiate CPAP at AHI less than 5/h.  2. Sleep hypoxemia (over 50 minutes total) without hypercapnia.    3. Severe Periodic Limb Movement Disorder (PLMD) - related to  PVD/ AVD.  4. No Snoring but no supine sleep was noted which may have  brought it on ( patient refused)  5. Pacemaker EKG.    IMAGES: CT abd/pelvis 10/08/20: IMPRESSION: 1. Mild wall thickening of the ascending colon, can be seen with colitis. 2. Peritoneal dialysis catheter with tip in the right lower quadrant. No adjacent fluid collection or inflammation. Small volume abdominopelvic ascites. 3. Trace right pleural effusion with streaky and patchy right basilar airspace disease, favor atelectasis over pneumonia. 4. Nodular hepatic contours suggesting cirrhosis. Small volume abdominopelvic ascites. 5. Stable infrarenal aortic aneurysm, maximal dimension 3.7 cm. Recommend follow-up every 2 years.  This recommendation follows ACR consensus guidelines: White Paper of the ACR Incidental Findings Committee II on Vascular Findings. J Am Coll Radiol 2013; 10:789-794. - Aortic Atherosclerosis (ICD10-I70.0).   EKG: 12/18/20: Normal sinus rhythm Low voltage QRS ST & T wave abnormality, consider lateral ischemia Lateral ischemia pattern is new since 04-Mar 2021 Confirmed by Croitoru, Mihai [phone]) on 12/18/2020 10:09:18 PM - He had high lateral T wave abnormality on 11/23/19 tracing, although more prominent on current tracing. He apparently had EKG done at 12/15/20 EP visit with Dr. Quentin Ore, but the tracing is not viewable in [facility].    CV: Echo 10/19/20: IMPRESSIONS   1. Left ventricular ejection fraction, by estimation, is 60 to 65%. The  left ventricle has normal function. Left ventricular endocardial border  not optimally defined to evaluate regional wall motion. There is moderate  left ventricular hypertrophy. Left  ventricular diastolic parameters are consistent with Grade I diastolic  dysfunction (impaired relaxation). Elevated left atrial pressure.   2. Right ventricular systolic function is normal. The right ventricular  size is normal.   3. Left atrial size was moderately dilated.   4. Right atrial size was mildly dilated.   5.  The mitral valve is normal in structure. No evidence of mitral valve  regurgitation. No evidence of mitral stenosis.   6. The aortic valve has an indeterminant number of cusps. There is mild  calcification of the aortic valve. There is mild thickening of the aortic  valve. Aortic valve regurgitation is not visualized. No aortic stenosis is  present.   7. The inferior vena cava is normal in size with greater than 50%  respiratory variability, suggesting right atrial pressure of 3 mmHg.  - Comparison echo 12/15/18: LVEF 60-65%, severely increased LV wall thickness, impaired relaxation, normal RVSF, mildly increased RV wall thickness    Nuclear stress test  10/19/20: There was no ST segment deviation noted during stress. Findings consistent with prior large inferior myocardial infarction. Mild to moderate apical ischemia. This is a high risk study. Risk based primarily on decreased LVEF, fairly limited current ischemia. Recommend correlating LVEF with echocardiogram. The left ventricular ejection fraction is moderately decreased (34%). - Cardiology felt findings were consistent with prior study as well as 2021 cath results. Continue medical management recommended.   US Carotid 08/17/20 ([facility]): Summary: 1.  The bilateral common and external carotid arteries have large calcific plaque throughout with no significant stenosis. 2.  The left vertebral arteries patent, appears to have preretrograde flow by color flow and Doppler signal. 3.  The left subclavian artery has turbulent color flow and large calcific plaque. 4.  1 to 49%, mild stenosis involving the proximal right internal carotid artery. 5.  1 to 49%, mild stenosis involving the proximal left internal carotid artery.   Aortogram with SMA angiography 08/02/19: Findings: Aorta has a small infrarenal aneurysm.  There is heavy calcification but no flow-limiting stenosis.  The SMA was cannulated again calcification was identified without flow-limiting stenosis.  No intervention was undertaken.   Cardiac cath 07/23/19: Mid LM to Dist LM lesion is 25% stenosed. Mid LAD lesion is 40% stenosed. 1st Diag-1 lesion is 80% stenosed. 1st Diag-2 lesion is 90% stenosed. 1st Diag-3 lesion is 90% stenosed. Ost Cx to Prox Cx lesion is 75% stenosed. Mid Cx lesion is 90% stenosed. Prox RCA lesion is 35% stenosed. Dist RCA lesion is 50% stenosed. The left ventricular systolic function is normal. LV end diastolic pressure is normal. The left ventricular ejection fraction is 55-65% by visual estimate.   1. Severe 2 vessel obstructive CAD. The RCA and LAD disease is stable compared to 2003. There  is progressive disease in a large diagonal branch and the LCx    - the diagonal is a large bifurcating vessel.  It has multiple severe stenoses in the main branch with severely calcified lesions    - 75% proximal LCx and 90% mid LCx. There is an acutely angulated take off from the left main and the vessel is severely calcified. 2. Normal LV function 3. Normal LVEDP   Plan: discussed with Dr Burt Knack. The diagonal is poorly suited for PCI due to diffuse and heavily calcified disease. The LCx is complex and would require atherectomy. The acute angulation would make it technically difficult. For now would recommend aggressive medical therapy.     Cardiac event monitor 07/20/17-08/02/17: 14 day event monitor Min HR 51, Max HR 115, Avg HR 69 Telemetry tracings show sinus rhythm and rate controlled atrial fibrillation Reported symptoms correlated with rate controlled afib   Past Medical History:  Diagnosis Date   AAA (abdominal aortic aneurysm) ([place]) 06/2016   Mild increase in size of 3.4 cm infrarenal abdominal  aortic aneurysm   Anemia    Aortic atherosclerosis (HCC)    Arthritis    Asthma    Atrial flutter (HCC)    AVF (arteriovenous fistula) (HCC)    Left forearm   CAD (coronary artery disease)    a. s/p prior stenting of RCA in 1999 b. cath in 2003 showing moderate CAD c. NST in 2016 showing small area of ischemic and low-risk   CHF (congestive heart failure) ([place])    CKD (chronic kidney disease), stage V ([place])    kidney transplant evaluation   COPD (chronic obstructive pulmonary disease) ([place])    with ongoing tobacco use and patient failed sham takes   Diverticulosis 2018   Mild sigmoid colon diverticulosis.   DM2 (diabetes mellitus, type 2) ([place])    Dyslipidemia    Dysrhythmia 2018   atrial fibrillation   Edema    chronic lower extremity secondary to right heart failure and chronic venous insufficiency   Fatigue    Fatty liver 2010   Mild   Headache    HTN (hypertension)     Morbid obesity (HCC)    Multiple pulmonary nodules 05/2018   Bilateral   Myocardial infarction ([place])    Osteoporosis    Pneumonia    Presence of permanent cardiac pacemaker    PVD (peripheral vascular disease) ([place])    with toe amputations secondary to Buerger's disease.    Reflux esophagitis    hx   Two-vessel coronary artery disease    moderate. by cath in 2003. Status post stenting of the mdi RCA November 1999 normal left ventricular ejection fraction   Wears glasses    Wears hearing aid    B/L    Past Surgical History:  Procedure Laterality Date   A/V FISTULAGRAM Left 04/30/2019   Procedure: A/V FISTULAGRAM;  Surgeon: Elam Dutch, MD;  Location: [place] [facility];  Service: Cardiovascular;  Laterality: Left;   A/V FISTULAGRAM Left 09/22/2019   Procedure: A/V FISTULAGRAM;  Surgeon: Marty Heck, MD;  Location: [place] [facility];  Service: Cardiovascular;  Laterality: Left;   ABDOMINAL SURGERY     APPENDECTOMY     AV FISTULA PLACEMENT Right 03/11/2018   Procedure: CREATION Brachiocephalic Fistula RIGHT ARM;  Surgeon: Rosetta Posner, MD;  Location: [place];  Service: Vascular;  Laterality: Right;   AV FISTULA PLACEMENT Left 05/06/2019   Procedure: LEFT BRACHIOCEPHALIC ARTERIOVENOUS (AV) FISTULA CREATION;  Surgeon: Marty Heck, MD;  Location: [place];  Service: Vascular;  Laterality: Left;   BACK SURGERY     x3; cages in patient's back since 2000   BIOPSY  11/12/2018   Procedure: BIOPSY;  Surgeon: Laurence Spates, MD;  Location: [facility];  Service: Endoscopy;;   CARDIAC CATHETERIZATION     stent   CHOLECYSTECTOMY     CLOSED REDUCTION SHOULDER DISLOCATION     COLONOSCOPY W/ BIOPSIES AND POLYPECTOMY     COLONOSCOPY WITH PROPOFOL N/A 11/12/2018   Procedure: COLONOSCOPY WITH PROPOFOL;  Surgeon: Laurence Spates, MD;  Location: [facility];  Service: Endoscopy;  Laterality: N/A;   CORONARY ANGIOPLASTY     CYSTOSCOPY WITH INSERTION OF UROLIFT N/A 11/06/2020    Procedure: CYSTOSCOPY WITH INSERTION OF UROLIFT;  Surgeon: Cleon Gustin, MD;  Location: [facility];  Service: Urology;  Laterality: N/A;   diabetic ulcers     DIAGNOSTIC LAPAROSCOPY     DIALYSIS/PERMA CATHETER REMOVAL N/A 09/22/2019   Procedure: DIALYSIS/PERMA CATHETER REMOVAL;  Surgeon: Marty Heck, MD;  Location: [place] [facility];  Service: Cardiovascular;  Laterality: N/A;   ESOPHAGOGASTRODUODENOSCOPY (EGD) WITH PROPOFOL N/A 11/12/2018   Procedure: ESOPHAGOGASTRODUODENOSCOPY (EGD) WITH PROPOFOL;  Surgeon: Laurence Spates, MD;  [facility];  Service: Endoscopy;  Laterality: N/A;   GROIN EXPLORATION     HEMOSTASIS CLIP PLACEMENT  11/12/2018   Procedure: HEMOSTASIS CLIP PLACEMENT;  Surgeon: Laurence Spates, MD;  [facility];  Service: Endoscopy;;   HIP ARTHROPLASTY Left    gamma nail   INSERT / REPLACE / [place] (AV) ARTEGRAFT ARM Left 03/18/2018   Procedure: INSERTION OF ARTERIOVENOUS (AV) FISTULA;  Surgeon: Rosetta Posner, MD;  Location: [place];  Service: Vascular;  Laterality: Left;   IR FLUORO GUIDE CV LINE RIGHT  04/11/2019   IR US GUIDE VASC ACCESS RIGHT  04/11/2019   LEFT HEART CATH AND CORONARY ANGIOGRAPHY N/A 07/23/2019   Procedure: LEFT HEART CATH AND CORONARY ANGIOGRAPHY;  Surgeon: Martinique, Peter M, MD;  Location: [place] [facility];  Service: Cardiovascular;  Laterality: N/A;   LIGATION ARTERIOVENOUS GORTEX GRAFT Right 03/18/2018   Procedure: LIGATION ARTERIOVENOUS FISTULA;  Surgeon: Rosetta Posner, MD;  Location[facility];  Service: Vascular;  Laterality: Right;   OSTECTOMY Left 04/23/2017   Procedure: OSTECTOMY LEFT GREAT TOE;  Surgeon: [name], DPM;  Location[facility];  Service: Podiatry;  Laterality: Left;  left great toe   POLYPECTOMY  11/12/2018   Procedure: POLYPECTOMY;  Surgeon: Laurence Spates, MD;  [facility];  Service: Endoscopy;;   TUMOR REMOVAL     small intestine x3   VISCERAL  ANGIOGRAPHY N/A 08/02/2019   Procedure: VISCERAL ANGIOGRAPHY;  Surgeon: Waynetta Sandy, MD;  Location: [place] [facility];  Service: Cardiovascular;  Laterality: N/A;    MEDICATIONS:  albuterol (VENTOLIN HFA) 108 (90 Base) MCG/ACT inhaler   aspirin EC 81 MG EC tablet   atorvastatin (LIPITOR) 40 MG tablet   beclomethasone (QVAR) 80 MCG/ACT inhaler   CALCIUM CITRATE PO   carvedilol (COREG) 25 MG tablet   cephALEXin (KEFLEX) 250 MG capsule   Cholecalciferol (VITAMIN D3) 2000 units TABS   clobetasol (TEMOVATE) 0.05 % external solution   CYPROHEPTADINE HCL PO   denosumab (PROLIA) 60 MG/ML SOSY injection   lactulose (CHRONULAC) 10 GM/15ML solution   linaclotide (LINZESS) 290 MCG CAPS capsule   magnesium oxide (MAG-OX) 400 MG tablet   Melatonin 10 MG TABS   multivitamin (RENA-VIT) TABS tablet   nitrofurantoin, macrocrystal-monohydrate, (MACROBID) 100 MG capsule   nitroGLYCERIN (NITROSTAT) 0.4 MG SL tablet   nystatin-triamcinolone ointment (MYCOLOG)   omeprazole (PRILOSEC) 20 MG capsule   ondansetron (ZOFRAN) 4 MG tablet   polyethylene glycol (MIRALAX / GLYCOLAX) packet   Potassium Chloride ER 20 MEQ TBCR   risperiDONE (RISPERDAL) 0.5 MG tablet   senna-docusate (SENOKOT-S) 8.6-50 MG tablet   sertraline (ZOLOFT) 100 MG tablet   tamsulosin (FLOMAX) 0.4 MG CAPS capsule   tiZANidine (ZANAFLEX) 2 MG tablet   tolterodine (DETROL LA) 4 MG 24 hr capsule   traMADol (ULTRAM) 50 MG tablet   No current facility-administered medications for this encounter.    Myra Gianotti, PA-C Surgical Short Stay/Anesthesiology [facility] Phone [phone] [facility] Phone [phone] 12/19/2020 10:26 AM

## 2020-12-19 NOTE — Anesthesia Preprocedure Evaluation (Addendum)
Anesthesia Evaluation  Patient identified by MRN, date of birth, ID band Patient awake    Reviewed: Allergy & Precautions, NPO status , Patient's Chart, lab work & pertinent test results, reviewed documented beta blocker date and time   Airway Mallampati: II  TM Distance: >3 FB Neck ROM: Full    Dental no notable dental hx. (+) Dental Advisory Given, Teeth Intact   Pulmonary asthma , neg sleep apnea, COPD (uses rescue inhaler every few days),  COPD inhaler, Patient abstained from smoking., former smoker,  Quit smoking 2021   Pulmonary exam normal breath sounds clear to auscultation       Cardiovascular hypertension, Pt. on medications and Pt. on home beta blockers + CAD (PCI 1999), + Past MI (2021, cath w/ no intervention), + Peripheral Vascular Disease and +CHF (grade 1 diastolic dysfunction)  Normal cardiovascular exam+ dysrhythmias (off eliquis since 202 d/t GIB) Atrial Fibrillation + pacemaker (placed 09/2017 in [facility] after syncope with SB and 5 second pause during admission)  Rhythm:Regular Rate:Normal  Echo 10/19/20: IMPRESSIONS  1. Left ventricular ejection fraction, by estimation, is 60 to 65%. The  left ventricle has normal function. Left ventricular endocardial border  not optimally defined to evaluate regional wall motion. There is moderate  left ventricular hypertrophy. Left  ventricular diastolic parameters are consistent with Grade I diastolic  dysfunction (impaired relaxation). Elevated left atrial pressure.  2. Right ventricular systolic function is normal. The right ventricular  size is normal.  3. Left atrial size was moderately dilated.  4. Right atrial size was mildly dilated.  5. The mitral valve is normal in structure. No evidence of mitral valve  regurgitation. No evidence of mitral stenosis.  6. The aortic valve has an indeterminant number of cusps. There is mild  calcification of the aortic valve. There is  mild thickening of the aortic  valve. Aortic valve regurgitation is not visualized. No aortic stenosis is  present.  7. The inferior vena cava is normal in size with greater than 50%  respiratory variability, suggesting right atrial pressure of 3 mmHg.  - Comparison echo 12/15/18: LVEF 60-65%, severely increased LV wall thickness, impaired relaxation, normal RVSF, mildly increased RV wall thickness    Nuclear stress test 10/19/20: . There was no ST segment deviation noted during stress. . Findings consistent with prior large inferior myocardial infarction. Mild to moderate apical ischemia. . This is a high risk study. Risk based primarily on decreased LVEF, fairly limited current ischemia. Recommend correlating LVEF with echocardiogram. . The left ventricular ejection fraction is moderately decreased (34%). - Cardiology felt findings were consistent with prior study as well as 2021 cath results. Continue medical management recommended.       AAA ( 3.7 cm 09/2020 CT),    Neuro/Psych  Headaches, PSYCHIATRIC DISORDERS Anxiety  Neuromuscular disease (lumbar spinal stenosis w/ neurogenic claudication)    GI/Hepatic Neg liver ROS, GERD  Medicated and Controlled,  Endo/Other  neg diabetesMorbid obesitya1c 5.5  Renal/GU ESRF and DialysisRenal disease (on peritoneal dialysis)  negative genitourinary   Musculoskeletal  (+) Arthritis , Osteoarthritis,    Abdominal   Peds  Hematology  (+) Blood dyscrasia, anemia ,   Anesthesia Other Findings   Reproductive/Obstetrics negative OB ROS                          Anesthesia Physical Anesthesia Plan  ASA: 3  Anesthesia Plan: MAC   Post-op Pain Management:    Induction:  PONV Risk Score and Plan: 2 and Propofol infusion and TIVA  Airway Management Planned: Natural Airway and Simple Face Mask  Additional Equipment: None  Intra-op Plan:   Post-operative Plan:   Informed Consent: I have reviewed the  patients History and Physical, chart, labs and discussed the procedure including the risks, benefits and alternatives for the proposed anesthesia with the patient or authorized representative who has indicated his/her understanding and acceptance.     Dental advisory given  Plan Discussed with: CRNA  Anesthesia Plan Comments: (PPM reprogrammed to asynchronous in preop )     Anesthesia Quick Evaluation

## 2020-12-19 NOTE — Progress Notes (Signed)
No answer on either number listed. Left message on both answering machines with new arrival time of 0630 tomorrow morning. Left callback number on voicemail as well.

## 2020-12-20 ENCOUNTER — Ambulatory Visit (HOSPITAL_COMMUNITY)
Admission: RE | Admit: 2020-12-20 | Discharge: 2020-12-20 | Disposition: A | Discharge status: Home / Self Care | Payer: Medicare Other | Attending: Pain Medicine | Admitting: Pain Medicine

## 2020-12-20 ENCOUNTER — Other Ambulatory Visit: Payer: Self-pay

## 2020-12-20 ENCOUNTER — Encounter (HOSPITAL_COMMUNITY)
Admission: RE | Disposition: A | Discharge status: Home / Self Care | Payer: Self-pay | Source: Home / Self Care | Attending: Pain Medicine

## 2020-12-20 ENCOUNTER — Ambulatory Visit (HOSPITAL_COMMUNITY): Payer: Medicare Other | Admitting: Vascular Surgery

## 2020-12-20 ENCOUNTER — Ambulatory Visit (HOSPITAL_COMMUNITY): Payer: Medicare Other

## 2020-12-20 ENCOUNTER — Encounter (HOSPITAL_COMMUNITY): Payer: Self-pay | Admitting: Pain Medicine

## 2020-12-20 DIAGNOSIS — Z992 Dependence on renal dialysis: Secondary | ICD-10-CM | POA: Diagnosis not present

## 2020-12-20 DIAGNOSIS — Z950 Presence of cardiac pacemaker: Secondary | ICD-10-CM | POA: Diagnosis not present

## 2020-12-20 DIAGNOSIS — I132 Hypertensive heart and chronic kidney disease with heart failure and with stage 5 chronic kidney disease, or end stage renal disease: Secondary | ICD-10-CM | POA: Diagnosis not present

## 2020-12-20 DIAGNOSIS — J449 Chronic obstructive pulmonary disease, unspecified: Secondary | ICD-10-CM | POA: Insufficient documentation

## 2020-12-20 DIAGNOSIS — I509 Heart failure, unspecified: Secondary | ICD-10-CM | POA: Insufficient documentation

## 2020-12-20 DIAGNOSIS — G894 Chronic pain syndrome: Secondary | ICD-10-CM | POA: Insufficient documentation

## 2020-12-20 DIAGNOSIS — Z87891 Personal history of nicotine dependence: Secondary | ICD-10-CM | POA: Diagnosis not present

## 2020-12-20 DIAGNOSIS — M961 Postlaminectomy syndrome, not elsewhere classified: Secondary | ICD-10-CM | POA: Diagnosis present

## 2020-12-20 DIAGNOSIS — Z419 Encounter for procedure for purposes other than remedying health state, unspecified: Secondary | ICD-10-CM

## 2020-12-20 DIAGNOSIS — N186 End stage renal disease: Secondary | ICD-10-CM | POA: Diagnosis not present

## 2020-12-20 HISTORY — PX: SPINAL CORD STIMULATOR INSERTION: SHX5378

## 2020-12-20 LAB — POCT I-STAT, CHEM 8
BUN: 46 mg/dL — ABNORMAL HIGH (ref 8–23)
Calcium, Ion: 1.24 mmol/L (ref 1.15–1.40)
Chloride: 96 mmol/L — ABNORMAL LOW (ref 98–111)
Creatinine, Ser: 6 mg/dL — ABNORMAL HIGH (ref 0.61–1.24)
Glucose, Bld: 127 mg/dL — ABNORMAL HIGH (ref 70–99)
HCT: 41 % (ref 39.0–52.0)
Hemoglobin: 13.9 g/dL (ref 13.0–17.0)
Potassium: 4.6 mmol/L (ref 3.5–5.1)
Sodium: 133 mmol/L — ABNORMAL LOW (ref 135–145)
TCO2: 28 mmol/L (ref 22–32)

## 2020-12-20 LAB — GLUCOSE, CAPILLARY
Glucose-Capillary: 139 mg/dL — ABNORMAL HIGH (ref 70–99)
Glucose-Capillary: 132 mg/dL — ABNORMAL HIGH (ref 70–99)

## 2020-12-20 SURGERY — INSERTION, SPINAL CORD STIMULATOR, LUMBAR
Anesthesia: Monitor Anesthesia Care | Site: Back

## 2020-12-20 MED ORDER — FENTANYL CITRATE (PF) 250 MCG/5ML IJ SOLN
INTRAMUSCULAR | Status: DC | PRN
  Administered 2020-12-20: 50 ug via INTRAVENOUS
  Administered 2020-12-20 (×2): 25 ug via INTRAVENOUS

## 2020-12-20 MED ORDER — CEFAZOLIN SODIUM-DEXTROSE 2-3 GM-%(50ML) IV SOLR
INTRAVENOUS | Status: DC | PRN
  Administered 2020-12-20: 2 g via INTRAVENOUS

## 2020-12-20 MED ORDER — BUPIVACAINE HCL 0.25 % IJ SOLN
INTRAMUSCULAR | Status: DC | PRN
  Administered 2020-12-20: 10 mL

## 2020-12-20 MED ORDER — FENTANYL CITRATE (PF) 250 MCG/5ML IJ SOLN
INTRAMUSCULAR | Status: AC
  Filled 2020-12-20: qty 5

## 2020-12-20 MED ORDER — BUPIVACAINE HCL (PF) 0.25 % IJ SOLN
INTRAMUSCULAR | Status: AC
  Filled 2020-12-20: qty 60

## 2020-12-20 MED ORDER — MIDAZOLAM HCL 2 MG/2ML IJ SOLN
INTRAMUSCULAR | Status: AC
  Filled 2020-12-20: qty 2

## 2020-12-20 MED ORDER — ONDANSETRON HCL 4 MG/2ML IJ SOLN
INTRAMUSCULAR | Status: DC | PRN
  Administered 2020-12-20: 4 mg via INTRAVENOUS

## 2020-12-20 MED ORDER — LACTATED RINGERS IV SOLN: INTRAVENOUS | Status: DC

## 2020-12-20 MED ORDER — DEXAMETHASONE SODIUM PHOSPHATE 10 MG/ML IJ SOLN
INTRAMUSCULAR | Status: DC | PRN
  Administered 2020-12-20: 5 mg via INTRAVENOUS

## 2020-12-20 MED ORDER — PROPOFOL 1000 MG/100ML IV EMUL
INTRAVENOUS | Status: AC
  Filled 2020-12-20: qty 100

## 2020-12-20 MED ORDER — LIDOCAINE-EPINEPHRINE 2 %-1:100000 IJ SOLN
INTRAMUSCULAR | Status: AC
  Filled 2020-12-20: qty 2

## 2020-12-20 MED ORDER — CHLORHEXIDINE GLUCONATE 0.12 % MT SOLN
15.0000 mL | Freq: Once | OROMUCOSAL | Status: AC
Start: 2020-12-20 — End: 2020-12-20
  Administered 2020-12-20: 15 mL via OROMUCOSAL
  Filled 2020-12-20: qty 15

## 2020-12-20 MED ORDER — PHENYLEPHRINE 40 MCG/ML (10ML) SYRINGE FOR IV PUSH (FOR BLOOD PRESSURE SUPPORT)
PREFILLED_SYRINGE | INTRAVENOUS | Status: DC | PRN
  Administered 2020-12-20 (×5): 80 ug via INTRAVENOUS

## 2020-12-20 MED ORDER — KETAMINE HCL 50 MG/5ML IJ SOSY
PREFILLED_SYRINGE | INTRAMUSCULAR | Status: AC
  Filled 2020-12-20: qty 5

## 2020-12-20 MED ORDER — PROPOFOL 10 MG/ML IV BOLUS
INTRAVENOUS | Status: AC
  Filled 2020-12-20: qty 20

## 2020-12-20 MED ORDER — PHENYLEPHRINE 40 MCG/ML (10ML) SYRINGE FOR IV PUSH (FOR BLOOD PRESSURE SUPPORT)
PREFILLED_SYRINGE | INTRAVENOUS | Status: AC
  Filled 2020-12-20: qty 10

## 2020-12-20 MED ORDER — EPHEDRINE 5 MG/ML INJ
INTRAVENOUS | Status: AC
  Filled 2020-12-20: qty 5

## 2020-12-20 MED ORDER — LIDOCAINE-EPINEPHRINE 2 %-1:100000 IJ SOLN
INTRAMUSCULAR | Status: DC | PRN
  Administered 2020-12-20: 26 mL

## 2020-12-20 MED ORDER — ORAL CARE MOUTH RINSE: 15.0000 mL | Freq: Once | OROMUCOSAL | Status: AC

## 2020-12-20 MED ORDER — 0.9 % SODIUM CHLORIDE (POUR BTL) OPTIME
TOPICAL | Status: DC | PRN
  Administered 2020-12-20: 1000 mL

## 2020-12-20 MED ORDER — EPHEDRINE SULFATE-NACL 50-0.9 MG/10ML-% IV SOSY
PREFILLED_SYRINGE | INTRAVENOUS | Status: DC | PRN
  Administered 2020-12-20 (×4): 10 mg via INTRAVENOUS

## 2020-12-20 MED ORDER — SODIUM CHLORIDE 0.9 % IV SOLN: INTRAVENOUS | Status: DC | PRN

## 2020-12-20 MED ORDER — MIDAZOLAM HCL 2 MG/2ML IJ SOLN
INTRAMUSCULAR | Status: DC | PRN
  Administered 2020-12-20 (×2): 1 mg via INTRAVENOUS

## 2020-12-20 MED ORDER — CEFAZOLIN SODIUM 1 G IJ SOLR
INTRAMUSCULAR | Status: AC
  Filled 2020-12-20: qty 20

## 2020-12-20 MED ORDER — DEXMEDETOMIDINE (PRECEDEX) IN NS 20 MCG/5ML (4 MCG/ML) IV SYRINGE
PREFILLED_SYRINGE | INTRAVENOUS | Status: DC | PRN
  Administered 2020-12-20 (×2): 4 ug via INTRAVENOUS
  Administered 2020-12-20: 8 ug via INTRAVENOUS

## 2020-12-20 SURGICAL SUPPLY — 68 items
ADH SKN CLS APL DERMABOND .7 (GAUZE/BANDAGES/DRESSINGS)
ANCH LD 4 SETX2 CLIK X (Stimulator) ×1 IMPLANT
ANCHOR CLIK X NEURO (Stimulator) ×1 IMPLANT
APL PRP STRL LF DISP 70% ISPRP (MISCELLANEOUS) ×1
APL SKNCLS STERI-STRIP NONHPOA (GAUZE/BANDAGES/DRESSINGS)
BAG COUNTER SPONGE SURGICOUNT (BAG) ×1 IMPLANT
BAG DECANTER FOR FLEXI CONT (MISCELLANEOUS) ×1 IMPLANT
BAG SPNG CNTER NS LX DISP (BAG)
BENZOIN TINCTURE PRP APPL 2/3 (GAUZE/BANDAGES/DRESSINGS) IMPLANT
BINDER ABDOMINAL 12 ML 46-62 (SOFTGOODS) ×2 IMPLANT
BLADE CLIPPER SURG (BLADE) IMPLANT
CHLORAPREP W/TINT 26 (MISCELLANEOUS) ×2 IMPLANT
CLEANER TIP ELECTROSURG 2X2 (MISCELLANEOUS) ×2 IMPLANT
CLIP VESOCCLUDE SM WIDE 6/CT (CLIP) IMPLANT
CONTROL REMOTE FREELINK ALPHA (NEUROSURGERY SUPPLIES) ×1 IMPLANT
DERMABOND ADVANCED (GAUZE/BANDAGES/DRESSINGS)
DERMABOND ADVANCED .7 DNX12 (GAUZE/BANDAGES/DRESSINGS) ×1 IMPLANT
DEVICE FIXATE SUTURING DUAL (MISCELLANEOUS) ×1 IMPLANT
DRAPE C-ARM 42X72 X-RAY (DRAPES) ×2 IMPLANT
DRAPE C-ARMOR (DRAPES) ×2 IMPLANT
DRAPE LAPAROTOMY 100X72X124 (DRAPES) ×2 IMPLANT
DRAPE SURG 17X23 STRL (DRAPES) ×8 IMPLANT
DRSG OPSITE POSTOP 3X4 (GAUZE/BANDAGES/DRESSINGS) IMPLANT
DRSG OPSITE POSTOP 4X6 (GAUZE/BANDAGES/DRESSINGS) ×2 IMPLANT
ELECT REM PT RETURN 9FT ADLT (ELECTROSURGICAL) ×2
ELECTRODE REM PT RTRN 9FT ADLT (ELECTROSURGICAL) ×1 IMPLANT
GAUZE 4X4 16PLY ~~LOC~~+RFID DBL (SPONGE) ×2 IMPLANT
GLOVE EXAM NITRILE XL STR (GLOVE) IMPLANT
GLOVE EXAM NITRILE XS STR PU (GLOVE) IMPLANT
GLOVE SRG 8 PF TXTR STRL LF DI (GLOVE) ×1 IMPLANT
GLOVE SURG ENC MOIS LTX SZ8 (GLOVE) ×2 IMPLANT
GLOVE SURG UNDER POLY LF SZ8 (GLOVE) ×6
GOWN STRL REUS W/ TWL LRG LVL3 (GOWN DISPOSABLE) IMPLANT
GOWN STRL REUS W/ TWL XL LVL3 (GOWN DISPOSABLE) IMPLANT
GOWN STRL REUS W/TWL 2XL LVL3 (GOWN DISPOSABLE) ×1 IMPLANT
GOWN STRL REUS W/TWL LRG LVL3 (GOWN DISPOSABLE)
GOWN STRL REUS W/TWL XL LVL3 (GOWN DISPOSABLE) ×2
KIT BASIN OR (CUSTOM PROCEDURE TRAY) ×2 IMPLANT
KIT CHARGING (KITS) ×1
KIT CHARGING PRECISION NEURO (KITS) IMPLANT
KIT IPG ALPHA WAVEWRITER (Stimulator) ×1 IMPLANT
KIT LEAD 50CM (Lead) ×2 IMPLANT
KIT TURNOVER KIT B (KITS) ×2 IMPLANT
NDL 18GX1X1/2 (RX/OR ONLY) (NEEDLE) IMPLANT
NDL HYPO 25X1 1.5 SAFETY (NEEDLE) ×1 IMPLANT
NEEDLE 18GX1X1/2 (RX/OR ONLY) (NEEDLE) IMPLANT
NEEDLE HYPO 25X1 1.5 SAFETY (NEEDLE) ×2 IMPLANT
NS IRRIG 1000ML POUR BTL (IV SOLUTION) ×2 IMPLANT
PACK LAMINECTOMY NEURO (CUSTOM PROCEDURE TRAY) ×2 IMPLANT
PAD ARMBOARD 7.5X6 YLW CONV (MISCELLANEOUS) ×4 IMPLANT
SPONGE SURGIFOAM ABS GEL SZ50 (HEMOSTASIS) IMPLANT
SPONGE T-LAP 4X18 ~~LOC~~+RFID (SPONGE) ×2 IMPLANT
STAPLER SKIN PROX WIDE 3.9 (STAPLE) ×2 IMPLANT
STRIP CLOSURE SKIN 1/2X4 (GAUZE/BANDAGES/DRESSINGS) IMPLANT
SUT MNCRL AB 4-0 PS2 18 (SUTURE) IMPLANT
SUT SILK 0 (SUTURE)
SUT SILK 0 MO-6 18XCR BRD 8 (SUTURE) ×1 IMPLANT
SUT SILK 0 TIES 10X30 (SUTURE) IMPLANT
SUT SILK 2 0 TIES 10X30 (SUTURE) IMPLANT
SUT VIC AB 2-0 CP2 18 (SUTURE) ×4 IMPLANT
SYR 10ML LL (SYRINGE) IMPLANT
SYR EPIDURAL 5ML GLASS (SYRINGE) ×2 IMPLANT
TOOL LONG TUNNEL (SPINAL CORD STIMULATOR) ×1 IMPLANT
TOWEL GREEN STERILE (TOWEL DISPOSABLE) ×2 IMPLANT
TOWEL GREEN STERILE FF (TOWEL DISPOSABLE) ×2 IMPLANT
WATER STERILE IRR 1000ML POUR (IV SOLUTION) ×2 IMPLANT
WRENCH HEX 7.6 (SPINAL CORD STIMULATOR) ×1 IMPLANT
YANKAUER SUCT BULB TIP NO VENT (SUCTIONS) ×2 IMPLANT

## 2020-12-20 NOTE — Transfer of Care (Signed)
Immediate Anesthesia Transfer of Care Note  Patient: [name]  Procedure(s) Performed: Permanent Spinal Cord Stimulator  Lead Implant with Fluoroscopy and Battery Implant (Back)  Patient Location: PACU  Anesthesia Type:MAC  Level of Consciousness: awake and drowsy  Airway & Oxygen Therapy: Patient Spontanous Breathing and Patient connected to nasal cannula oxygen  Post-op Assessment: Report given to RN and Post -op Vital signs reviewed and stable  Post vital signs: Reviewed and stable  Last Vitals:  Vitals Value Taken Time  BP 117/61 12/20/20 1015  Temp 36.2 C 12/20/20 1000  Pulse 64 12/20/20 1019  Resp 16 12/20/20 1019  SpO2 96 % 12/20/20 1019  Vitals shown include unvalidated device data.  Last Pain:  Vitals:   12/20/20 1000  TempSrc:   PainSc: 0-No pain         Complications: No notable events documented.

## 2020-12-20 NOTE — Op Note (Signed)
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  Questions answered, consent obtained.  Pacemaker checked preoperatively. Taken to OR.  Placed prone, all pressure points padded.  Anesthesia adminstered.  Ancef 2gm administered.  Time out.  Sterile prep in usual fashion.  T12-L1 interspace identified under flouroscopy.  Overlying skin anesthetized with 2% lidocaine + epi.  Incision made.  Dissection to supraspinous ligament.  Under flouroscopic guideance with loss of resistance to air technique, interspace easily accessed.  Lead easily threaded to top of T8, just to right.  Second lead placed in identical fashion, however final position was just to the left of the top of T8.  Lateral imaging noted leads in epidural space.  Leads secured with [employer] anchors and stay fix device.  Final imaging noted appropriate postion.  Next, attention directed to left flank.  Skin anesthetized with 2% lidocaine+epinephrine.  Incision made, pocket for battery created.  Leads then tunneled from midline to flank pocket.  Leads connected to battery, all parameters appropriate, leads then secured with hex wrench.  Each pocket irrigated with saline, hemostasis achieved.  Each pocket closed with interrupted 2-0 vicryls followed by staples.  0.25 % bupivicaine used to anesthetized skin incisions prior to dressing.  See or records for exact local anesthetic amounts.  Complications: None  Implants:  [employer] 8 contact spinal cord stimulator lead X 2 [employer] Wavewriter battery  EBL: Minimal  Fluids: per anesthesia  Disposition: PACU, then home with wife.  Pacemaker check by representative.   Clinic follow up in 5 days. 

## 2020-12-20 NOTE — H&P (Signed)
CC: My back hurts  HPI Here for permanent SCS implant.  Over 50% benefit from trial procedure.  PMH: ESRD, Pacemaker, COPD.  Meds: reviewed in MAR  Allergies: reviewed in MAR.  ROS: Negative except as mentioned  FH: [place]  SOC: no tobacco  PE:  Vitals reviewed Alert Regular breathing pattern Regular pulse 5/5 strength in LE bilateraly  A/P Failed back surgery syndrome, here for permanent SCS.   Spinal cord stimulator implant today 5 day clinic follow up.

## 2020-12-20 NOTE — Anesthesia Postprocedure Evaluation (Signed)
Anesthesia Post Note  Patient: [name]  Procedure(s) Performed: Permanent Spinal Cord Stimulator  Lead Implant with Fluoroscopy and Battery Implant (Back)     Patient location during evaluation: PACU Anesthesia Type: MAC Level of consciousness: awake and alert Pain management: pain level controlled Vital Signs Assessment: post-procedure vital signs reviewed and stable Respiratory status: spontaneous breathing, nonlabored ventilation and respiratory function stable Cardiovascular status: blood pressure returned to baseline and stable Postop Assessment: no apparent nausea or vomiting Anesthetic complications: no   No notable events documented.  Last Vitals:  Vitals:   12/20/20 1015 12/20/20 1030  BP: 117/61 124/71  Pulse: 64 62  Resp: 16 18  Temp:  36.4 C  SpO2: 97% 98%    Last Pain:  Vitals:   12/20/20 1000  TempSrc:   PainSc: 0-No pain                 Pervis Hocking

## 2020-12-20 NOTE — Anesthesia Procedure Notes (Signed)
Procedure Name: MAC Date/Time: 12/20/2020 8:36 AM Performed by: Trinna Post., CRNA Pre-anesthesia Checklist: Patient identified, Emergency Drugs available, Suction available, Patient being monitored and Timeout performed Patient Re-evaluated:Patient Re-evaluated prior to induction Oxygen Delivery Method: Nasal cannula Preoxygenation: Pre-oxygenation with 100% oxygen Induction Type: IV induction Placement Confirmation: CO2 detector

## 2020-12-21 ENCOUNTER — Encounter (HOSPITAL_COMMUNITY): Payer: Self-pay | Admitting: Pain Medicine

## 2020-12-26 NOTE — Progress Notes (Signed)
Remote pacemaker transmission.   

## 2021-03-02 ENCOUNTER — Other Ambulatory Visit: Payer: Self-pay

## 2021-03-02 ENCOUNTER — Encounter: Payer: Self-pay | Admitting: Urology

## 2021-03-02 ENCOUNTER — Ambulatory Visit (INDEPENDENT_AMBULATORY_CARE_PROVIDER_SITE_OTHER): Payer: Medicare Other | Admitting: Urology

## 2021-03-02 VITALS — BP 119/69 | HR 60 | Temp 97.9°F | Wt 207.0 lb

## 2021-03-02 DIAGNOSIS — N390 Urinary tract infection, site not specified: Secondary | ICD-10-CM | POA: Diagnosis not present

## 2021-03-02 DIAGNOSIS — N5201 Erectile dysfunction due to arterial insufficiency: Secondary | ICD-10-CM

## 2021-03-02 DIAGNOSIS — I6523 Occlusion and stenosis of bilateral carotid arteries: Secondary | ICD-10-CM | POA: Diagnosis not present

## 2021-03-02 DIAGNOSIS — R339 Retention of urine, unspecified: Secondary | ICD-10-CM

## 2021-03-02 NOTE — Progress Notes (Signed)
03/02/2021 10:42 AM   [name] [birth year] [id number]  Referring provider: Tobe Sos, MD [address] [place] [address]  Followup BPH and erectile dysfunction   HPI: [name] is a 70yo here for followup for BPHa nd ED> IPSS 8 QOl 2. He has urinary frequency which does not bother him. No UTIs since last visit.  He has failed multiple ED medications. He is not interested in trimix injections. No other complaints today.   PMH: Past Medical History:  Diagnosis Date   AAA (abdominal aortic aneurysm) 06/2016   Mild increase in size of 3.4 cm infrarenal abdominal aortic aneurysm   Anemia    Aortic atherosclerosis (HCC)    Arthritis    Asthma    Atrial flutter (HCC)    AVF (arteriovenous fistula) (HCC)    Left forearm   CAD (coronary artery disease)    a. s/p prior stenting of RCA in 1999 b. cath in 2003 showing moderate CAD c. NST in 2016 showing small area of ischemic and low-risk   CHF (congestive heart failure) ([place])    CKD (chronic kidney disease), stage V ([place])    kidney transplant evaluation   COPD (chronic obstructive pulmonary disease) ([place])    with ongoing tobacco use and patient failed sham takes   Diverticulosis 2018   Mild sigmoid colon diverticulosis.   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    Reflux esophagitis    hx   Two-vessel coronary artery disease    moderate. by cath in 2003. Status post stenting of the mdi RCA November 1999  normal left ventricular ejection fraction   Wears glasses    Wears hearing aid    B/L    Surgical History: Past Surgical History:  Procedure Laterality Date   A/V FISTULAGRAM Left 04/30/2019   Procedure: A/V FISTULAGRAM;  Surgeon: Elam Dutch, MD;  Location: [place] [facility];  Service: Cardiovascular;  Laterality: Left;   A/V FISTULAGRAM Left 09/22/2019   Procedure: A/V FISTULAGRAM;  Surgeon: Marty Heck, MD;  Location: [place] [facility];  Service: Cardiovascular;  Laterality: Left;   ABDOMINAL SURGERY     APPENDECTOMY     AV FISTULA PLACEMENT Right 03/11/2018   Procedure: CREATION Brachiocephalic Fistula RIGHT ARM;  Surgeon: Rosetta Posner, MD;  Location: [facility];  Service: Vascular;  Laterality: Right;   AV FISTULA PLACEMENT Left 05/06/2019   Procedure: LEFT BRACHIOCEPHALIC ARTERIOVENOUS (AV) FISTULA CREATION;  Surgeon: Marty Heck, MD;  Location: [place];  Service: Vascular;  Laterality: Left;   BACK SURGERY     x3; cages in patient's back since 2000   BIOPSY  11/12/2018   Procedure: BIOPSY;  Surgeon: Laurence Spates, MD;  Location: [facility];  Service: Endoscopy;;   CARDIAC CATHETERIZATION     stent   [place]  COLONOSCOPY W/ BIOPSIES AND POLYPECTOMY     COLONOSCOPY WITH PROPOFOL N/A 11/12/2018   Procedure: COLONOSCOPY WITH PROPOFOL;  Surgeon: Laurence Spates, MD;  Location: [facility];  Service: Endoscopy;  Laterality: N/A;   CORONARY ANGIOPLASTY     CYSTOSCOPY WITH INSERTION OF UROLIFT N/A 11/06/2020   Procedure: CYSTOSCOPY WITH INSERTION OF UROLIFT;  Surgeon: Cleon Gustin, MD;  Location: [facility];  Service: Urology;  Laterality: N/A;   diabetic ulcers     DIAGNOSTIC LAPAROSCOPY     DIALYSIS/PERMA CATHETER REMOVAL N/A 09/22/2019   Procedure: DIALYSIS/PERMA CATHETER REMOVAL;  Surgeon: Marty Heck, MD;  Location: [facility];  Service: Cardiovascular;  Laterality: N/A;    ESOPHAGOGASTRODUODENOSCOPY (EGD) WITH PROPOFOL N/A 11/12/2018   Procedure: ESOPHAGOGASTRODUODENOSCOPY (EGD) WITH PROPOFOL;  Surgeon: Laurence Spates, MD;  [facility];  Service: Endoscopy;  Laterality: N/A;   GROIN EXPLORATION     HEMOSTASIS CLIP PLACEMENT  11/12/2018   Procedure: HEMOSTASIS CLIP PLACEMENT;  Surgeon: Laurence Spates, MD;  [facility];  Service: Endoscopy;;   HIP ARTHROPLASTY Left    gamma nail   INSERT / REPLACE / [place] (AV) ARTEGRAFT ARM Left 03/18/2018   Procedure: INSERTION OF ARTERIOVENOUS (AV) FISTULA;  Surgeon: Rosetta Posner, MD;  Location: [facility];  Service: Vascular;  Laterality: Left;   IR FLUORO GUIDE CV LINE RIGHT  04/11/2019   IR US GUIDE VASC ACCESS RIGHT  04/11/2019   LEFT HEART CATH AND CORONARY ANGIOGRAPHY N/A 07/23/2019   Procedure: LEFT HEART CATH AND CORONARY ANGIOGRAPHY;  Surgeon: Martinique, Peter M, MD;  Location: [place] [facility];  Service: Cardiovascular;  Laterality: N/A;   LIGATION ARTERIOVENOUS GORTEX GRAFT Right 03/18/2018   Procedure: LIGATION ARTERIOVENOUS FISTULA;  Surgeon: Rosetta Posner, MD;  Location: [facility];  Service: Vascular;  Laterality: Right;   OSTECTOMY Left 04/23/2017   Procedure: OSTECTOMY LEFT GREAT TOE;  Surgeon: [name], DPM;  [facility];  Service: Podiatry;  Laterality: Left;  left great toe   POLYPECTOMY  11/12/2018   Procedure: POLYPECTOMY;  Surgeon: Laurence Spates, MD;  [facility];  Service: Endoscopy;;   SPINAL CORD STIMULATOR INSERTION N/A 12/20/2020   Procedure: Permanent Spinal Cord Stimulator  Lead Implant with Fluoroscopy and Battery Implant;  Surgeon: Reece Agar, MD;  Location: [place];  Service: Neurosurgery;  Laterality: N/A;   TUMOR REMOVAL     small intestine x3   VISCERAL ANGIOGRAPHY N/A 08/02/2019   Procedure: VISCERAL ANGIOGRAPHY;  Surgeon: Waynetta Sandy, MD;  Location: [place] [facility];  Service: Cardiovascular;   Laterality: N/A;    Home Medications:  Allergies as of 03/02/2021       Reactions   Other    Opiods cause hallucinations and delusions has to be given risperidone to settle down   Requip [ropinirole] Other (See Comments)   Dry mouth        Medication List        Accurate as of March 02, 2021 10:42 AM. If you have any questions, ask your nurse or doctor.          STOP taking these medications    cephALEXin 250 MG capsule Commonly known as: Keflex Stopped by: Nicolette Bang, MD   tiZANidine 2 MG tablet Commonly known as: ZANAFLEX Stopped by: Nicolette Bang, MD   tolterodine 4 MG 24 hr capsule Commonly known as: Detrol LA Stopped by: Nicolette Bang, MD   traMADol 50 MG  tablet Commonly known as: Ultram Stopped by: Nicolette Bang, MD       TAKE these medications    albuterol 108 (90 Base) MCG/ACT inhaler Commonly known as: VENTOLIN HFA Inhale 2 puffs into the lungs every 6 (six) hours as needed for wheezing or shortness of breath.   aspirin 81 MG EC tablet Take 1 tablet (81 mg total) by mouth daily.   atorvastatin 40 MG tablet Commonly known as: LIPITOR Take 1 tablet by mouth once daily   beclomethasone 80 MCG/ACT inhaler Commonly known as: QVAR Inhale 2 puffs into the lungs in the morning and at bedtime.   CALCIUM CITRATE PO Take 600 mg by mouth daily.   carvedilol 25 MG tablet Commonly known as: COREG Take 25 mg by mouth 2 (two) times daily with a meal.   clobetasol 0.05 % external solution Commonly known as: TEMOVATE Apply 1 application topically 2 (two) times daily as needed (itching).   CYPROHEPTADINE HCL PO Take 2 mg by mouth daily.   denosumab 60 MG/ML Sosy injection Commonly known as: PROLIA Inject 60 mg into the skin every 6 (six) months.   lactulose 10 GM/15ML solution Commonly known as: CHRONULAC Take 20 g by mouth 2 (two) times daily as needed for mild constipation.   linaclotide 290 MCG Caps capsule Commonly known  as: LINZESS Take 290 mcg by mouth daily as needed (constipation).   magnesium oxide 400 MG tablet Commonly known as: MAG-OX Take 400 mg by mouth daily.   Melatonin 10 MG Tabs Take 20 mg by mouth at bedtime.   multivitamin Tabs tablet Take 1 tablet by mouth daily.   nitrofurantoin (macrocrystal-monohydrate) 100 MG capsule Commonly known as: MACROBID Take 1 capsule (100 mg total) by mouth at bedtime.   nitroGLYCERIN 0.4 MG SL tablet Commonly known as: NITROSTAT Place 0.4 mg under the tongue every 5 (five) minutes as needed for chest pain.   nystatin-triamcinolone ointment Commonly known as: MYCOLOG Apply 1 application topically 2 (two) times daily.   omeprazole 20 MG capsule Commonly known as: PRILOSEC Take 20 mg by mouth daily.   ondansetron 4 MG tablet Commonly known as: Zofran Take 1 tablet (4 mg total) by mouth every 8 (eight) hours as needed for nausea or vomiting.   polyethylene glycol 17 g packet Commonly known as: MIRALAX / GLYCOLAX Take 17 g by mouth daily as needed for mild constipation.   Potassium Chloride ER 20 MEQ Tbcr Take 20 mEq by mouth daily.   risperiDONE 0.5 MG tablet Commonly known as: RISPERDAL Take 0.5 mg by mouth at bedtime.   senna-docusate 8.6-50 MG tablet Commonly known as: Senokot-S Take 2 tablets by mouth at bedtime.   sertraline 100 MG tablet Commonly known as: ZOLOFT Take 100 mg by mouth in the morning.   tamsulosin 0.4 MG Caps capsule Commonly known as: FLOMAX Take 1 capsule (0.4 mg total) by mouth daily after supper.   Vitamin D3 50 MCG (2000 UT) Tabs Take 2,000 Units by mouth daily.        Allergies:  Allergies  Allergen Reactions   Other     Opiods cause hallucinations and delusions has to be given risperidone to settle down   Requip [Ropinirole] Other (See Comments)    Dry mouth    Family History: Family History  Problem Relation Age of Onset   Diabetes Mother    Alcohol abuse Father     Social History:   reports that he quit smoking about 15 months ago. His smoking  use included cigarettes. He started smoking about 52 years ago. He has never used smokeless tobacco. He reports that he does not currently use alcohol. He reports that he does not use drugs.  ROS: All other review of systems were reviewed and are negative except what is noted above in HPI  Physical Exam: BP 119/69   Pulse 60   Temp 97.9 F (36.6 C)   Wt 207 lb (93.9 kg)   BMI 25.20 kg/m   Constitutional:  Alert and oriented, No acute distress. HEENT: [place] AT, moist mucus membranes.  Trachea midline, no masses. Cardiovascular: No clubbing, cyanosis, or edema. Respiratory: Normal respiratory effort, no increased work of breathing. GI: Abdomen is soft, nontender, nondistended, no abdominal masses GU: No CVA tenderness.  Lymph: No cervical or inguinal lymphadenopathy. Skin: No rashes, bruises or suspicious lesions. Neurologic: Grossly intact, no focal deficits, moving all 4 extremities. Psychiatric: Normal mood and affect.  Laboratory Data: Lab Results  Component Value Date   WBC 9.9 12/18/2020   HGB 13.9 12/20/2020   HCT 41.0 12/20/2020   MCV 110.8 (H) 12/18/2020   PLT 174 12/18/2020    Lab Results  Component Value Date   CREATININE 6.00 (H) 12/20/2020    No results found for: PSA  Lab Results  Component Value Date   TESTOSTERONE 297 07/28/2015    Lab Results  Component Value Date   HGBA1C 5.5 12/18/2020    Urinalysis    Component Value Date/Time   COLORURINE YELLOW 07/22/2019 1625   APPEARANCEUR Hazy (A) 09/25/2020 1149   LABSPEC 1.010 07/22/2019 1625   PHURINE 6.0 07/22/2019 1625   GLUCOSEU Negative 09/25/2020 1149   HGBUR NEGATIVE 07/22/2019 1625   BILIRUBINUR Negative 09/25/2020 1149   KETONESUR NEGATIVE 07/22/2019 1625   PROTEINUR 2+ (A) 09/25/2020 1149   PROTEINUR >=300 (A) 07/22/2019 1625   UROBILINOGEN 0.2 09/27/2014 0027   NITRITE Negative 09/25/2020 1149   NITRITE NEGATIVE 07/22/2019  1625   LEUKOCYTESUR 1+ (A) 09/25/2020 1149   LEUKOCYTESUR NEGATIVE 07/22/2019 1625    Lab Results  Component Value Date   LABMICR See below: 09/25/2020   WBCUA 11-30 (A) 09/25/2020   LABEPIT 0-10 09/25/2020   MUCUS Present 09/11/2020   BACTERIA Few (A) 09/25/2020    Pertinent Imaging:  No results found for this or any previous visit.  No results found for this or any previous visit.  No results found for this or any previous visit.  No results found for this or any previous visit.  Results for orders placed in visit on 09/15/20  US RENAL  Narrative CLINICAL DATA:  Recurrent urinary tract infection.  EXAM: RENAL / URINARY TRACT ULTRASOUND COMPLETE  COMPARISON:  Renal ultrasound 04/05/2016.  Abdominal CT 07/28/2019.  FINDINGS: Right Kidney:  Renal measurements: 10.3 x 5.1 x 4.0 cm = volume: 109.8 mL. Mild renal cortical thinning and increased echogenicity. There is a cystic lesion in the upper pole which measures up to 1.8 cm. Prominent renal sinus fat. No hydronephrosis or suspicious cortical lesion.  Left Kidney:  Renal measurements: 10.5 x 6.2 x 4.2 cm = volume: 140.9 mL. Mild renal cortical thinning and increased echogenicity. Prominent renal sinus fat. No hydronephrosis or focal cortical lesion.  Bladder:  Not visualized.  Other:  Study is mildly limited by body habitus.  No evidence of ascites.  IMPRESSION: 1. Both kidneys demonstrate mild cortical thinning and increased cortical echogenicity consistent with "chronic medical renal disease". 2. Small cyst in the upper pole of the right kidney.  No hydronephrosis. 3. The bladder was not visualized.   Electronically Signed By: Richardean Sale M.D. On: 09/18/2020 12:11  No results found for this or any previous visit.  No results found for this or any previous visit.  No results found for this or any previous visit.   Assessment & Plan:    1. Recurrent UTI Resolved after urolift  2.  Urinary tract infection without hematuria, site unspecified Resolved after urolift  3. Incomplete emptying of bladder Resolved after urolift  4. Erectile dysfunction due to arterial insufficiency Patient defers therapy at this time   No follow-ups on file.  Nicolette Bang, MD  [facility] [place]

## 2021-03-02 NOTE — Progress Notes (Signed)
Urological Symptom Review  Patient is experiencing the following symptoms: Injury to kidneys/bladder   Review of Systems  Gastrointestinal (upper)  : Negative for upper GI symptoms  Gastrointestinal (lower) : Negative for lower GI symptoms  Constitutional : Negative for symptoms  Skin: Negative for skin symptoms  Eyes: Negative for eye symptoms  Ear/Nose/Throat : Negative for Ear/Nose/Throat symptoms  Hematologic/Lymphatic: Negative for Hematologic/Lymphatic symptoms  Cardiovascular : Negative for cardiovascular symptoms  Respiratory : Negative for respiratory symptoms  Endocrine: Negative for endocrine symptoms  Musculoskeletal: Negative for musculoskeletal symptoms  Neurological: Negative for neurological symptoms  Psychologic: Negative for psychiatric symptoms  

## 2021-03-02 NOTE — Patient Instructions (Signed)
Erectile Dysfunction °Erectile dysfunction (ED) is the inability to get or keep an erection in order to have sexual intercourse. ED is considered a symptom of an underlying disorder and is not considered a disease. ED may include: °Inability to get an erection. °Lack of enough hardness of the erection to allow penetration. °Loss of erection before sex is finished. °What are the causes? °This condition may be caused by: °Physical causes, such as: °Artery problems. This may include heart disease, high blood pressure, atherosclerosis, and diabetes. °Hormonal problems, such as low testosterone. °Obesity. °Nerve problems. This may include back or pelvic injuries, multiple sclerosis, Parkinson's disease, spinal cord injury, and stroke. °Certain medicines, such as: °Pain relievers. °Antidepressants. °Blood pressure medicines and water pills (diuretics). °Cancer medicines. °Antihistamines. °Muscle relaxants. °Lifestyle factors, such as: °Use of drugs such as marijuana, cocaine, or opioids. °Excessive use of alcohol. °Smoking. °Lack of physical activity or exercise. °Psychological causes, such as: °Anxiety or stress. °Sadness or depression. °Exhaustion. °Fear about sexual performance. °Guilt. °What are the signs or symptoms? °Symptoms of this condition include: °Inability to get an erection. °Lack of enough hardness of the erection to allow penetration. °Loss of the erection before sex is finished. °Sometimes having normal erections, but with frequent unsatisfactory episodes. °Low sexual satisfaction in either partner due to erection problems. °A curved penis occurring with erection. The curve may cause pain, or the penis may be too curved to allow for intercourse. °Never having nighttime or morning erections. °How is this diagnosed? °This condition is often diagnosed by: °Performing a physical exam to find other diseases or specific problems with the penis. °Asking you detailed questions about the problem. °Doing tests,  such as: °Blood tests to check for diabetes mellitus or high cholesterol, or to measure hormone levels. °Other tests to check for underlying health conditions. °An ultrasound exam to check for scarring. °A test to check blood flow to the penis. °Doing a sleep study at home to measure nighttime erections. °How is this treated? °This condition may be treated by: °Medicines, such as: °Medicine taken by mouth to help you achieve an erection (oral medicine). °Hormone replacement therapy to replace low testosterone levels. °Medicine that is injected into the penis. Your health care provider may instruct you how to give yourself these injections at home. °Medicine that is delivered with a short applicator tube. The tube is inserted into the opening at the tip of the penis, which is the opening of the urethra. A tiny pellet of medicine is put in the urethra. The pellet dissolves and enhances erectile function. This is also called MUSE (medicated urethral system for erections) therapy. °Vacuum pump. This is a pump with a ring on it. The pump and ring are placed on the penis and used to create pressure that helps the penis become erect. °Penile implant surgery. In this procedure, you may receive: °An inflatable implant. This consists of cylinders, a pump, and a reservoir. The cylinders can be inflated with a fluid that helps to create an erection, and they can be deflated after intercourse. °A semi-rigid implant. This consists of two silicone rubber rods. The rods provide some rigidity. They are also flexible, so the penis can both curve downward in its normal position and become straight for sexual intercourse. °Blood vessel surgery to improve blood flow to the penis. During this procedure, a blood vessel from a different part of the body is placed into the penis to allow blood to flow around (bypass) damaged or blocked blood vessels. °Lifestyle changes,   such as exercising more, losing weight, and quitting smoking. °Follow  these instructions at home: °Medicines ° °Take over-the-counter and prescription medicines only as told by your health care provider. Do not increase the dosage without first discussing it with your health care provider. °If you are using self-injections, do injections as directed by your health care provider. Make sure you avoid any veins that are on the surface of the penis. After giving an injection, apply pressure to the injection site for 5 minutes. °Talk to your health care provider about how to prevent headaches while taking ED medicines. These medicines may cause a sudden headache due to the increase in blood flow in your body. °General instructions °Exercise regularly, as directed by your health care provider. Work with your health care provider to lose weight, if needed. °Do not use any products that contain nicotine or tobacco. These products include cigarettes, chewing tobacco, and vaping devices, such as e-cigarettes. If you need help quitting, ask your health care provider. °Before using a vacuum pump, read the instructions that come with the pump and discuss any questions with your health care provider. °Keep all follow-up visits. This is important. °Contact a health care provider if: °You feel nauseous. °You are vomiting. °You get sudden headaches while taking ED medicines. °You have any concerns about your sexual health. °Get help right away if: °You are taking oral or injectable medicines and you have an erection that lasts longer than 4 hours. If your health care provider is unavailable, go to the nearest emergency room for evaluation. An erection that lasts much longer than 4 hours can result in permanent damage to your penis. °You have severe pain in your groin or abdomen. °You develop redness or severe swelling of your penis. °You have redness spreading at your groin or lower abdomen. °You are unable to urinate. °You experience chest pain or a rapid heartbeat (palpitations) after taking oral  medicines. °These symptoms may represent a serious problem that is an emergency. Do not wait to see if the symptoms will go away. Get medical help right away. Call your local emergency services (911 in the [place] Do not drive yourself to the hospital. °Summary °Erectile dysfunction (ED) is the inability to get or keep an erection during sexual intercourse. °This condition is diagnosed based on a physical exam, your symptoms, and tests to determine the cause. Treatment varies depending on the cause and may include medicines, hormone therapy, surgery, or a vacuum pump. °You may need follow-up visits to make sure that you are using your medicines or devices correctly. °Get help right away if you are taking or injecting medicines and you have an erection that lasts longer than 4 hours. °This information is not intended to replace advice given to you by your health care provider. Make sure you discuss any questions you have with your health care provider. °Document Revised: 08/02/2020 Document Reviewed: 08/02/2020 °[employer] Patient Education © 2022 [employer] ° °

## 2021-03-05 ENCOUNTER — Ambulatory Visit (INDEPENDENT_AMBULATORY_CARE_PROVIDER_SITE_OTHER): Payer: Medicare Other

## 2021-03-05 ENCOUNTER — Other Ambulatory Visit: Payer: Self-pay

## 2021-03-05 ENCOUNTER — Ambulatory Visit (INDEPENDENT_AMBULATORY_CARE_PROVIDER_SITE_OTHER): Payer: Medicare Other | Admitting: Cardiology

## 2021-03-05 ENCOUNTER — Encounter: Payer: Self-pay | Admitting: Cardiology

## 2021-03-05 VITALS — BP 100/50 | HR 68 | Ht 76.0 in | Wt 240.2 lb

## 2021-03-05 DIAGNOSIS — I480 Paroxysmal atrial fibrillation: Secondary | ICD-10-CM

## 2021-03-05 DIAGNOSIS — I495 Sick sinus syndrome: Secondary | ICD-10-CM

## 2021-03-05 DIAGNOSIS — I6523 Occlusion and stenosis of bilateral carotid arteries: Secondary | ICD-10-CM

## 2021-03-05 DIAGNOSIS — E782 Mixed hyperlipidemia: Secondary | ICD-10-CM

## 2021-03-05 DIAGNOSIS — I10 Essential (primary) hypertension: Secondary | ICD-10-CM | POA: Diagnosis not present

## 2021-03-05 NOTE — Progress Notes (Signed)
Clinical Summary [name] is a 70 y.o.male seen today for follow up of the following medical problems.      1. Aflutter/Afib - low afib burden by pacemaker check   - no recent palpitations. No bleeding on eliquis.     - eliquis stopped 10/2018 due to anemia by Dr [name]   - issues with GI bleeding 10/2018, heme +stools, iron deficiency. Colonscopy with nonbleeding angiodysplastic lesion, small polyps. EGD polyp no signs of bleeding.       - previosuyl discussed watchman device with Dr [name]  - 11/2020 evaluated by Dr [name], thought poor watchman candidate due to chronic medical conditions and ESRD   - no recent palpitations - compliant with meds     2. Symptomatic bradycardia - s/p pacemaker placement following episode of syncope - admit 09/28/17 to [name] [place] [facility] with dizziness and syncope. - followed by EP. Chronically elevated RV lead threshold but stable     11/2020 device check normal function    3. OSA screen - very mild OSA by 11/2017 study,did have sleep hypoxemia     4. Carotid stenosis - noted during recent admission for syncope.  -09/2017 carotid US in [place] with  RICA <02%, LICA 72-53%   10/6438 carotid US: LICA 34-74%, RICA plaque without stenosis. (PA [name] referred to vascular)  08/2020 carotid US [name]: 1-49% bilateral ICA disease.    5. HTN - he is compliant with meds - prior issues with low bp's on HD, no recent issues now on peritoneal HD   6. CAD   - prior stent to RCA in 1999. cath 05/2001 : LM 20-30%, LAD 50-60% And 60-70% mid, LCX with mid AV portion 60-70%, RCA with patent stent with proximal 40-50% disease. LVEF by LVgram 65%, LVEDP 20. Overall moderate lesions, not great anatomically for PCI per notes, treated medically.   - 10/2014 Lexiscan with small area of ischemia mid inferior to apical, overall low risk  - Jan 2019 nuclear stress no ischemia    - admit 07/2019 with NSTEMI (peak trop 229), cath as reported below. Overall  complex anatomy difficult to pursue with PCI, recs for medical management.  He only develops symptoms postdialysis when his blood pressure is low.   - imdur lowered to 30mg  due to headaches.  - started on ranexa 500mg  bid by PA [name] at 3/22 visit - he reports HD has increased his dry weight with some improvement.  - pain diffuse midabdomen, typically grabbing like pain. +SOB, +hot. Can last 10 minutes up to 2 hours.  - some mild chest discomfort with exertion.    - insurance would not cover ranexa   10/2020 nuclear stress: large inferior infarct, mild to mod apical ischemia 10/2020 echo LVEF 60-65%, grade I dd - no recent chest pain.      7. Chronic diastolic HF -25/9563  echo LVEF 60-65%, grade II diastoilc dysfunction -11/2018 echo LVEF 60-65%, grade I dd, normal RV   - some recent weight gain calorie related, no LE edema, SOB, or DOE   8. ESRD - followed by renal  - on peritoneal HD, just finished training. Tolerating peritoneal HD - no recurrent chest pain.    - home bp's 120s/70-80s on peritoneal dialysis.      6. Hyperlipidemia   - compliant with statin   07/2019 TC 93 TG 95 HDL 38 LDL 36 - labs followed by pcp     7. COPD - followed by pcp  8. AAA 06/2016 CT A/P 3.4 cm infrerenal AAA - 07/2019 mesenteric [place] AAA 3.3 x 3.6 cm 07/2019 CT A/P 3.7 AAA, rece [place] 2 years  09/2020 Advanced aortic and [name] atherosclerosis. Infrarenal aortic aneurysm with maximal dimension 3.7 cm     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    10 . Chronic back - 12/2020 had back stimulator implant - has had some improvement since procedure.   Past Medical History:  Diagnosis Date   AAA (abdominal aortic aneurysm) 06/2016   Mild increase in size of 3.4 cm infrarenal abdominal aortic aneurysm   Anemia    Aortic atherosclerosis (HCC)    Arthritis    Asthma    Atrial flutter (HCC)    AVF (arteriovenous fistula) (HCC)    Left forearm   CAD (coronary  artery disease)    a. s/p prior stenting of RCA in 1999 b. cath in 2003 showing moderate CAD c. NST in 2016 showing small area of ischemic and low-risk   CHF (congestive heart failure) ([place])    CKD (chronic kidney disease), stage V ([place])    kidney transplant evaluation   COPD (chronic obstructive pulmonary disease) ([place])    with ongoing tobacco use and patient failed sham takes   Diverticulosis 2018   Mild sigmoid colon diverticulosis.   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    Reflux esophagitis    hx   Two-vessel coronary artery disease    moderate. by cath in 2003. Status post stenting of the mdi RCA November 1999 normal left ventricular ejection fraction   Wears glasses    Wears hearing aid    B/L     Allergies  Allergen Reactions   Other     Opiods cause hallucinations and delusions has to be given risperidone to settle down   Requip [Ropinirole] Other (See Comments)    Dry mouth     Current Outpatient Medications  Medication Sig Dispense Refill   albuterol (VENTOLIN HFA) 108 (90 Base) MCG/ACT inhaler Inhale 2 puffs into the lungs every 6 (six) hours as needed for wheezing or shortness of breath.      aspirin EC 81 MG EC tablet Take 1 tablet (81 mg total) by mouth daily.     atorvastatin (LIPITOR) 40 MG tablet Take 1 tablet by mouth once daily (Patient taking differently: Take 40 mg by mouth daily.) 90 tablet 1   beclomethasone (QVAR) 80 MCG/ACT inhaler Inhale 2  puffs into the lungs in the morning and at bedtime.     CALCIUM CITRATE PO Take 600 mg by mouth daily.     carvedilol (COREG) 25 MG tablet Take 25 mg by mouth 2 (two) times daily with a meal.     Cholecalciferol (VITAMIN D3) 2000 units TABS Take 2,000 Units by mouth daily.      clobetasol (TEMOVATE) 0.05 %  external solution Apply 1 application topically 2 (two) times daily as needed (itching).     CYPROHEPTADINE HCL PO Take 2 mg by mouth daily.     denosumab (PROLIA) 60 MG/ML SOSY injection Inject 60 mg into the skin every 6 (six) months.     lactulose (CHRONULAC) 10 GM/15ML solution Take 20 g by mouth 2 (two) times daily as needed for mild constipation.     linaclotide (LINZESS) 290 MCG CAPS capsule Take 290 mcg by mouth daily as needed (constipation).     magnesium oxide (MAG-OX) 400 MG tablet Take 400 mg by mouth daily.     Melatonin 10 MG TABS Take 20 mg by mouth at bedtime.     multivitamin (RENA-VIT) TABS tablet Take 1 tablet by mouth daily.     nitrofurantoin, macrocrystal-monohydrate, (MACROBID) 100 MG capsule Take 1 capsule (100 mg total) by mouth at bedtime. 30 capsule 3   nitroGLYCERIN (NITROSTAT) 0.4 MG SL tablet Place 0.4 mg under the tongue every 5 (five) minutes as needed for chest pain.     nystatin-triamcinolone ointment (MYCOLOG) Apply 1 application topically 2 (two) times daily. 30 g 0   omeprazole (PRILOSEC) 20 MG capsule Take 20 mg by mouth daily.     ondansetron (ZOFRAN) 4 MG tablet Take 1 tablet (4 mg total) by mouth every 8 (eight) hours as needed for nausea or vomiting. 20 tablet 0   polyethylene glycol (MIRALAX / GLYCOLAX) packet Take 17 g by mouth daily as needed for mild constipation.     Potassium Chloride ER 20 MEQ TBCR Take 20 mEq by mouth daily.     risperiDONE (RISPERDAL) 0.5 MG tablet Take 0.5 mg by mouth at bedtime.     senna-docusate (SENOKOT-S) 8.6-50 MG tablet Take 2 tablets by mouth at bedtime.     sertraline (ZOLOFT) 100 MG tablet Take 100 mg by mouth in  the morning.     No current facility-administered medications for this visit.     Past Surgical History:  Procedure Laterality Date   A/V FISTULAGRAM Left 04/30/2019   Procedure: A/V FISTULAGRAM;  Surgeon: Elam Dutch, MD;  Location: [facility];  Service: Cardiovascular;  Laterality: Left;   A/V FISTULAGRAM Left 09/22/2019   Procedure: A/V FISTULAGRAM;  Surgeon: Marty Heck, MD;  Location: [facility];  Service: Cardiovascular;  Laterality: Left;   ABDOMINAL SURGERY     APPENDECTOMY     AV FISTULA PLACEMENT Right 03/11/2018   Procedure: CREATION Brachiocephalic Fistula RIGHT ARM;  Surgeon: Rosetta Posner, MD;  Location: [facility];  Service: Vascular;  Laterality: Right;   AV FISTULA PLACEMENT Left 05/06/2019   Procedure: LEFT BRACHIOCEPHALIC ARTERIOVENOUS (AV) FISTULA CREATION;  Surgeon: Marty Heck, MD;  Location: [facility];  Service: Vascular;  Laterality: Left;   BACK SURGERY     x3; cages in patient's back since 2000   BIOPSY  11/12/2018   Procedure: BIOPSY;  Surgeon: Laurence Spates, MD;  Location: [facility];  Service: Endoscopy;;   CARDIAC CATHETERIZATION     stent   CHOLECYSTECTOMY     CLOSED REDUCTION SHOULDER DISLOCATION     COLONOSCOPY W/ BIOPSIES AND POLYPECTOMY     COLONOSCOPY WITH PROPOFOL N/A 11/12/2018   Procedure: COLONOSCOPY WITH PROPOFOL;  Surgeon: Laurence Spates, MD;  [facility]: [facility];  Service: Endoscopy;  Laterality: N/A;   CORONARY ANGIOPLASTY     CYSTOSCOPY WITH INSERTION OF UROLIFT N/A 11/06/2020   Procedure: CYSTOSCOPY WITH INSERTION OF UROLIFT;  Surgeon: Alyson Ingles,  Candee Furbish, MD;  Location: [facility];  Service: Urology;  Laterality: N/A;   diabetic ulcers     DIAGNOSTIC LAPAROSCOPY     DIALYSIS/PERMA CATHETER REMOVAL N/A 09/22/2019   Procedure: DIALYSIS/PERMA CATHETER REMOVAL;  Surgeon: Marty Heck, MD;  Location: [facility];  Service: Cardiovascular;  Laterality: N/A;   ESOPHAGOGASTRODUODENOSCOPY (EGD)  WITH PROPOFOL N/A 11/12/2018   Procedure: ESOPHAGOGASTRODUODENOSCOPY (EGD) WITH PROPOFOL;  Surgeon: Laurence Spates, MD;  [facility];  Service: Endoscopy;  Laterality: N/A;   GROIN EXPLORATION     HEMOSTASIS CLIP PLACEMENT  11/12/2018   Procedure: HEMOSTASIS CLIP PLACEMENT;  Surgeon: Laurence Spates, MD;  [facility];  Service: Endoscopy;;   HIP ARTHROPLASTY Left    gamma nail   INSERT / REPLACE / [place] (AV) ARTEGRAFT ARM Left 03/18/2018   Procedure: INSERTION OF ARTERIOVENOUS (AV) FISTULA;  Surgeon: Rosetta Posner, MD;  Location: [facility];  Service: Vascular;  Laterality: Left;   IR FLUORO GUIDE CV LINE RIGHT  04/11/2019   IR US GUIDE VASC ACCESS RIGHT  04/11/2019   LEFT HEART CATH AND CORONARY ANGIOGRAPHY N/A 07/23/2019   Procedure: LEFT HEART CATH AND CORONARY ANGIOGRAPHY;  Surgeon: Martinique, Peter M, MD;  Location: [facility];  Service: Cardiovascular;  Laterality: N/A;   LIGATION ARTERIOVENOUS GORTEX GRAFT Right 03/18/2018   Procedure: LIGATION ARTERIOVENOUS FISTULA;  Surgeon: Rosetta Posner, MD;  Location: [facility];  Service: Vascular;  Laterality: Right;   OSTECTOMY Left 04/23/2017   Procedure: OSTECTOMY LEFT GREAT TOE;  Surgeon: [name], DPM;  Location: [facility];  Service: Podiatry;  Laterality: Left;  left great toe   POLYPECTOMY  11/12/2018   Procedure: POLYPECTOMY;  Surgeon: Laurence Spates, MD;  [facility];  Service: Endoscopy;;   SPINAL CORD STIMULATOR INSERTION N/A 12/20/2020   Procedure: Permanent Spinal Cord Stimulator  Lead Implant with Fluoroscopy and Battery Implant;  Surgeon: Reece Agar, MD;  Location: [facility];  Service: Neurosurgery;  Laterality: N/A;   TUMOR REMOVAL     small intestine x3   VISCERAL ANGIOGRAPHY N/A 08/02/2019   Procedure: VISCERAL ANGIOGRAPHY;  Surgeon: Waynetta Sandy, MD;  Location: [facility];  Service: Cardiovascular;  Laterality: N/A;      Allergies  Allergen Reactions   Other     Opiods cause hallucinations and delusions has to be given risperidone to settle down   Requip [Ropinirole] Other (See Comments)    Dry mouth      Family History  Problem Relation Age of Onset   Diabetes Mother    Alcohol abuse Father      Social History Mr. Oakley reports that he quit smoking about 15 months ago. His smoking use included cigarettes. He started smoking about 52 years ago. He has never used smokeless tobacco. Mr. Viney reports that he does not currently use alcohol.   Review of Systems CONSTITUTIONAL: No weight loss, fever, chills, weakness or fatigue.  HEENT: Eyes: No visual loss, blurred vision, double vision or yellow sclerae.No hearing loss, sneezing, congestion, runny nose or sore throat.  SKIN: No rash or itching.  CARDIOVASCULAR: per hpi RESPIRATORY: No shortness of breath, cough or sputum.  GASTROINTESTINAL: No anorexia, nausea, vomiting or diarrhea. No abdominal pain or blood.  GENITOURINARY: No burning on urination, no polyuria NEUROLOGICAL: No headache, dizziness, syncope, paralysis, ataxia, numbness or tingling in the extremities. No change in bowel or bladder control.  MUSCULOSKELETAL: No muscle, back  pain, joint pain or stiffness.  LYMPHATICS: No enlarged nodes. No history of splenectomy.  PSYCHIATRIC: No history of depression or anxiety.  ENDOCRINOLOGIC: No reports of sweating, cold or heat intolerance. No polyuria or polydipsia.  Marland Kitchen   Physical Examination Today's Vitals   03/05/21 0958  BP: (!) 100/50  Pulse: 68  SpO2: 94%  Weight: 240 lb 3.2 oz (109 kg)  Height: 6\' 4"  (1.93 m)   Body mass index is 29.24 kg/m.  Gen: resting comfortably, no acute distress HEENT: no scleral icterus, pupils equal round and reactive, no palptable cervical adenopathy,  CV: RRR, no m/r/g no jvd Resp: Clear to auscultation bilaterally GI: abdomen is soft, non-tender, non-distended, normal bowel sounds, no  hepatosplenomegaly MSK: extremities are warm, no edema.  Skin: warm, no rash Neuro:  no focal deficits Psych: appropriate affect   Diagnostic Studies  05/2001 Cath   FINDINGS:   1. Left main trunk: Medium caliber vessel. This is a long vessel with a   distal taper of 20-30%.   2. Left anterior descending artery: This is a medium caliber vessel that   provides a large bifurcating first diagonal Lorra Freeman in the proximal segment   and before then extending to the apex, the LAD tapers significantly after   the diagonal Rolfe Hartsell. There is moderate disease of 50-60% after the   diagonal Marga Gramajo and then a focal eccentric narrowing of 60-70% in the mid   section of the LAD. The diagonal Blaiden Werth has moderate disease of 30-40% in   the proximal segment. The lateral division is a small caliber vessel with   an ostial narrowing of 60%.   3. Left circumflex artery: This is a small caliber vessel that provides a   trivial first marginal Brianah Hopson in the mid section and a medium caliber   second marginal Quanda Pavlicek distally. The mid AV circumflex at the takeoff of   the first and second diagonal branches had moderate diffuse disease of   60-70%.   4. Right coronary artery: Dominant. This is a large caliber vessel that   provides a posterior descending artery and two posteroventricular branches   in the terminal segment. The right coronary artery has evidence of an   existing stent in the mid section that is widely patent. Prior to stent is   a focal discrete narrowing of 40-50%. Distal to the stent is moderate   disease of 30%. The distal Lesleigh Hughson vessels also have mild disease of 30%.   5. Left ventricle: Normal end-systolic and end-diastolic dimensions. Overall   left ventricular function is well preserved. Ejection fraction is greater   than 65%. No mitral regurgitation. LV pressure is 180/10, aortic is   180/90. LV EDP equals 20.   ASSESSMENT AND PLAN: Mr. Hepp is a 70 year old gentleman with moderate    three-vessel coronary artery disease. The patients plaque burden in the left   anterior descending artery and circumflex appears to have increased somewhat   compared to his previous angiogram. Unfortunately, the mid and distal left   anterior descending artery is a small caliber vessel with a long diffuse   segment of disease and would be associated with a high restenosis rate.   Similarly, the circumflex would suffer the same fate.   Further assessment with a stress imaging study would be prudent to isolate the   area of ischemia. Percutaneous intervention may then be targeted if   indicated. In addition, aggressive medical therapy should be pursued as the   patient currently  is not on any anti-anginal therapy and has poorly controlled   hypertension. The patient will also be counseled regarding smoking cessation.      07/2013 PFTs   Mild obstruction       10/2014 Lexiscan   Unable to adequately ambulate to treadmill due to right hip pain. Lexiscan employed.   Defect 1: There is a small defect of mild severity present in the mid inferior and apical inferior location. This defect is partially reversible.   This is a low risk study.   Nuclear stress EF: 58%.   Small region of apical inferior ischemia      07/2015 Echo Study Conclusions   - Left ventricle: The cavity size was normal. Wall thickness was   increased increased in a pattern of mild to moderate LVH.   Systolic function was normal. The estimated ejection fraction was   in the range of 60% to 65%. Diastolic function is abnormal,   indeterminate grade. Wall motion was normal; there were no   regional wall motion abnormalities. - Aortic valve: Mildly calcified annulus. Trileaflet; mildly   thickened leaflets. Valve area (VTI): 2.39 cm^2. Valve area   (Vmax): 2.58 cm^2. Valve area (Vmean): 2.7 cm^2. - Mitral valve: Mildly calcified annulus. Normal thickness leaflets   . - Technically adequate study.   08/2015 US  aorta IMPRESSION: Mild aneurysmal dilatation of the distal abdominal aorta, 3.5 cm as well as the right common iliac artery, 1.9 cm. Recommend followup by ultrasound in 2 years. This recommendation follows ACR consensus guidelines:      Jan 2019 nuclear stress T wave inversions in inferior leads and nonspecific T wave abnormalities in V6 seen throughout study. The study is normal. No myocardial ischemia or scar. This is a low risk study. Nuclear stress EF: 56%.     07/2017 event monitor 14 day event monitor Min HR 51, Max HR 115, Avg HR 69 Telemetry tracings show sinus rhythm and rate controlled atrial fibrillation Reported symptoms correlated with rate controlled afib     07/2019 cath Mid LM to Dist LM lesion is 25% stenosed. Mid LAD lesion is 40% stenosed. 1st Diag-1 lesion is 80% stenosed. 1st Diag-2 lesion is 90% stenosed. 1st Diag-3 lesion is 90% stenosed. Ost Cx to Prox Cx lesion is 75% stenosed. Mid Cx lesion is 90% stenosed. Prox RCA lesion is 35% stenosed. Dist RCA lesion is 50% stenosed. The left ventricular systolic function is normal. LV end diastolic pressure is normal. The left ventricular ejection fraction is 55-65% by visual estimate.   1. Severe 2 vessel obstructive CAD. The RCA and LAD disease is stable compared to 2003. There is progressive disease in a large diagonal Howell Groesbeck and the LCx    - the diagonal is a large bifurcating vessel.  It has multiple severe stenoses in the main Bernarr Longsworth with severely calcified lesions    - 75% proximal LCx and 90% mid LCx. There is an acutely angulated take off from the left main and the vessel is severely calcified. 2. Normal LV function 3. Normal LVEDP   Plan: discussed with Dr Burt Knack. The diagonal is poorly suited for PCI due to diffuse and heavily calcified disease. The LCx is complex and would require atherectomy. The acute angulation would make it technically difficult. For now would recommend aggressive medical  therapy     Assessment and Plan   1. Paroxysmal aflutter/Afib/ Tachybrady syndrome -off anticoag due to prior GI bleeding. Was deemed not a candidate for watchman after EP evaluation -  no recent symptoms, continue curernt meds   2. HTN - issues with low bp's during HD now resovled on peritoneal HD - continue current meds   3. CAD   -no recent issues, continue current meds   4. Hyperlipidemia   -request labs from pcp, continue statin.      [name], M.D.

## 2021-03-05 NOTE — Patient Instructions (Signed)
Medication Instructions:  Your physician recommends that you continue on your current medications as directed. Please refer to the Current Medication list given to you today.  *If you need a refill on your cardiac medications before your next appointment, please call your pharmacy*   Lab Work: None today If you have labs (blood work) drawn today and your tests are completely normal, you will receive your results only by: MyChart Message (if you have MyChart) OR A paper copy in the mail If you have any lab test that is abnormal or we need to change your treatment, we will call you to review the results.   Testing/Procedures: None today   Follow-Up: At [facility], you and your health needs are our priority.  As part of our continuing mission to provide you with exceptional heart care, we have created designated Provider Care Teams.  These Care Teams include your primary Cardiologist (physician) and Advanced Practice Providers (APPs -  Physician Assistants and Nurse Practitioners) who all work together to provide you with the care you need, when you need it.  We recommend signing up for the patient portal called "MyChart".  Sign up information is provided on this After Visit Summary.  MyChart is used to connect with patients for Virtual Visits (Telemedicine).  Patients are able to view lab/test results, encounter notes, upcoming appointments, etc.  Non-urgent messages can be sent to your provider as well.   To learn more about what you can do with MyChart, go to [email or web address].    Your next appointment:   6 month(s)  The format for your next appointment:   In Person  Provider:   Jonathan Branch, MD   Other Instructions None     

## 2021-03-06 LAB — CUP PACEART REMOTE DEVICE CHECK
Battery Remaining Longevity: 144 mo
Battery Remaining Percentage: 100 %
Brady Statistic RA Percent Paced: 12 %
Brady Statistic RV Percent Paced: 38 %
Date Time Interrogation Session: 20221017044100
Implantable Lead Implant Date: 20190514
Implantable Lead Implant Date: 20190514
Implantable Lead Location: 753859
Implantable Lead Location: 753860
Implantable Lead Model: 7741
Implantable Lead Model: 7742
Implantable Lead Serial Number: 1015225
Implantable Lead Serial Number: 1020907
Implantable Pulse Generator Implant Date: 20190514
Lead Channel Impedance Value: 1033 Ohm
Lead Channel Impedance Value: 814 Ohm
Lead Channel Pacing Threshold Amplitude: 1.4 V
Lead Channel Pacing Threshold Pulse Width: 0.4 ms
Lead Channel Setting Pacing Amplitude: 2 V
Lead Channel Setting Pacing Amplitude: 2.5 V
Lead Channel Setting Pacing Pulse Width: 0.4 ms
Lead Channel Setting Sensing Sensitivity: 1.5 mV
Pulse Gen Serial Number: 832937

## 2021-03-13 NOTE — Progress Notes (Signed)
Remote pacemaker transmission.   

## 2021-03-16 ENCOUNTER — Other Ambulatory Visit: Payer: Self-pay | Admitting: Cardiology

## 2021-03-27 ENCOUNTER — Telehealth: Payer: Self-pay

## 2021-03-27 MED ORDER — AMBULATORY NON FORMULARY MEDICATION: 5 refills | Status: DC

## 2021-03-27 NOTE — Telephone Encounter (Signed)
Received call from patient stating he wanted to proceed with trimix injections.  Appointment made and medication sent to pharmacy.

## 2021-04-19 ENCOUNTER — Telehealth: Payer: Self-pay

## 2021-04-19 NOTE — Telephone Encounter (Signed)
Patient requesting ED injection medication be called into [facility] in [place], [place] Phone [phone].  Thanks, Helene Kelp

## 2021-04-20 NOTE — Telephone Encounter (Signed)
[facility] in [place] called, per pharmacist they are unable to fill Trimix.  Patient called and made aware. Patient given [facility] phone number, address, and informed to call pharmacy a few days in advance before ready for pick up.  Patient informed to bring medication to next appointment.  Patient voiced understanding.

## 2021-05-16 ENCOUNTER — Other Ambulatory Visit (HOSPITAL_COMMUNITY): Payer: Self-pay

## 2021-05-18 ENCOUNTER — Ambulatory Visit: Payer: Medicare Other | Admitting: Urology

## 2021-05-23 ENCOUNTER — Telehealth: Payer: Self-pay

## 2021-05-23 NOTE — Telephone Encounter (Signed)
Patient wants to know how many bottles does he need to bring to his Trimix appt in Feb.?  Please advise.  Thanks,  Helene Kelp

## 2021-05-23 NOTE — Telephone Encounter (Signed)
Pt was called and advised that he only needed to bring one bottle to his next appointment.

## 2021-05-31 ENCOUNTER — Other Ambulatory Visit (HOSPITAL_COMMUNITY): Payer: Self-pay

## 2021-06-04 ENCOUNTER — Ambulatory Visit (INDEPENDENT_AMBULATORY_CARE_PROVIDER_SITE_OTHER): Payer: Medicare Other

## 2021-06-04 DIAGNOSIS — I480 Paroxysmal atrial fibrillation: Secondary | ICD-10-CM | POA: Diagnosis not present

## 2021-06-04 LAB — CUP PACEART REMOTE DEVICE CHECK
Battery Remaining Longevity: 150 mo
Battery Remaining Percentage: 100 %
Brady Statistic RA Percent Paced: 23 %
Brady Statistic RV Percent Paced: 35 %
Date Time Interrogation Session: 20230116044200
Implantable Lead Implant Date: 20190514
Implantable Lead Implant Date: 20190514
Implantable Lead Location: 753859
Implantable Lead Location: 753860
Implantable Lead Model: 7741
Implantable Lead Model: 7742
Implantable Lead Serial Number: 1015225
Implantable Lead Serial Number: 1020907
Implantable Pulse Generator Implant Date: 20190514
Lead Channel Impedance Value: 1058 Ohm
Lead Channel Impedance Value: 774 Ohm
Lead Channel Pacing Threshold Amplitude: 1.4 V
Lead Channel Pacing Threshold Pulse Width: 0.4 ms
Lead Channel Setting Pacing Amplitude: 2 V
Lead Channel Setting Pacing Amplitude: 2.5 V
Lead Channel Setting Pacing Pulse Width: 0.4 ms
Lead Channel Setting Sensing Sensitivity: 1.5 mV
Pulse Gen Serial Number: 832937

## 2021-06-06 ENCOUNTER — Encounter: Payer: Self-pay | Admitting: Urology

## 2021-06-06 ENCOUNTER — Other Ambulatory Visit: Payer: Self-pay

## 2021-06-06 ENCOUNTER — Ambulatory Visit (INDEPENDENT_AMBULATORY_CARE_PROVIDER_SITE_OTHER): Payer: Medicare Other | Admitting: Urology

## 2021-06-06 VITALS — BP 128/70 | HR 63 | Wt 240.0 lb

## 2021-06-06 DIAGNOSIS — N5201 Erectile dysfunction due to arterial insufficiency: Secondary | ICD-10-CM

## 2021-06-06 NOTE — Progress Notes (Signed)
06/06/2021 3:16 PM   [name] May 03, 1951 [id number]  Referring provider: Tobe Sos, MD [address] [place] [address]  Erectile dysfunction   HPI: [name] is a 71yo here for followup for erectile dysfunction. He is here for trimix teaching.   PMH: Past Medical History:  Diagnosis Date   AAA (abdominal aortic aneurysm) 06/2016   Mild increase in size of 3.4 cm infrarenal abdominal aortic aneurysm   Anemia    Aortic atherosclerosis (HCC)    Arthritis    Asthma    Atrial flutter (HCC)    AVF (arteriovenous fistula) (HCC)    Left forearm   CAD (coronary artery disease)    a. s/p prior stenting of RCA in 1999 b. cath in 2003 showing moderate CAD c. NST in 2016 showing small area of ischemic and low-risk   CHF (congestive heart failure) ([place])    CKD (chronic kidney disease), stage V ([place])    kidney transplant evaluation   COPD (chronic obstructive pulmonary disease) ([place])    with ongoing tobacco use and patient failed sham takes   Diverticulosis 2018   Mild sigmoid colon diverticulosis.   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    Reflux esophagitis    hx   Two-vessel coronary artery disease    moderate. by cath in 2003. Status post stenting of the mdi RCA November 1999 normal left ventricular ejection fraction   Wears glasses    Wears hearing aid    B/L    Surgical History: Past Surgical History:  Procedure Laterality Date   A/V  FISTULAGRAM Left 04/30/2019   Procedure: A/V FISTULAGRAM;  Surgeon: Elam Dutch, MD;  Location: [facility];  Service: Cardiovascular;  Laterality: Left;   A/V FISTULAGRAM Left 09/22/2019   Procedure: A/V FISTULAGRAM;  Surgeon: Marty Heck, MD;  Location: [facility];  Service: Cardiovascular;  Laterality: Left;   ABDOMINAL SURGERY     APPENDECTOMY     AV FISTULA PLACEMENT Right 03/11/2018   Procedure: CREATION Brachiocephalic Fistula RIGHT ARM;  Surgeon: Rosetta Posner, MD;  Location: [facility];  Service: Vascular;  Laterality: Right;   AV FISTULA PLACEMENT Left 05/06/2019   Procedure: LEFT BRACHIOCEPHALIC ARTERIOVENOUS (AV) FISTULA CREATION;  Surgeon: Marty Heck, MD;  Location: [facility];  Service: Vascular;  Laterality: Left;   BACK SURGERY     x3; cages in patient's back since 2000   BIOPSY  11/12/2018   Procedure: BIOPSY;  Surgeon: Laurence Spates, MD;  [facility];  Service: Endoscopy;;   CARDIAC CATHETERIZATION     stent   CHOLECYSTECTOMY     CLOSED REDUCTION SHOULDER DISLOCATION     COLONOSCOPY W/ BIOPSIES AND POLYPECTOMY     COLONOSCOPY WITH PROPOFOL N/A 11/12/2018   Procedure: COLONOSCOPY WITH PROPOFOL;  Surgeon: Laurence Spates, MD;  [facility];  Service: Endoscopy;  Laterality: N/A;   CORONARY ANGIOPLASTY     CYSTOSCOPY WITH INSERTION OF UROLIFT N/A 11/06/2020   Procedure: CYSTOSCOPY WITH INSERTION OF UROLIFT;  Surgeon: Cleon Gustin, MD;  [facility];  Service: Urology;  Laterality: N/A;   diabetic ulcers     DIAGNOSTIC LAPAROSCOPY     DIALYSIS/PERMA CATHETER REMOVAL N/A 09/22/2019   Procedure: DIALYSIS/PERMA CATHETER REMOVAL;  Surgeon: Marty Heck, MD;  Location: [facility];  Service: Cardiovascular;  Laterality: N/A;   ESOPHAGOGASTRODUODENOSCOPY (EGD) WITH PROPOFOL N/A 11/12/2018   Procedure: ESOPHAGOGASTRODUODENOSCOPY (EGD) WITH PROPOFOL;  Surgeon: Laurence Spates, MD;  [facility];   Service: Endoscopy;  Laterality: N/A;   GROIN EXPLORATION     HEMOSTASIS CLIP PLACEMENT  11/12/2018   Procedure: HEMOSTASIS CLIP PLACEMENT;  Surgeon: Laurence Spates, MD;  [facility];  Service: Endoscopy;;   HIP ARTHROPLASTY Left    gamma nail   INSERT / REPLACE / [place] (AV) ARTEGRAFT ARM Left 03/18/2018   Procedure: INSERTION OF ARTERIOVENOUS (AV) FISTULA;  Surgeon: Rosetta Posner, MD;  Location: [facility] [place];  Service: Vascular;  Laterality: Left;   IR FLUORO GUIDE CV LINE RIGHT  04/11/2019   IR US GUIDE VASC ACCESS RIGHT  04/11/2019   LEFT HEART CATH AND CORONARY ANGIOGRAPHY N/A 07/23/2019   Procedure: LEFT HEART CATH AND CORONARY ANGIOGRAPHY;  Surgeon: Martinique, Peter M, MD;  Location: [facility];  Service: Cardiovascular;  Laterality: N/A;   LIGATION ARTERIOVENOUS GORTEX GRAFT Right 03/18/2018   Procedure: LIGATION ARTERIOVENOUS FISTULA;  Surgeon: Rosetta Posner, MD;  [facility];  Service: Vascular;  Laterality: Right;   OSTECTOMY Left 04/23/2017   Procedure: OSTECTOMY LEFT GREAT TOE;  Surgeon: [name], DPM;  [facility];  Service: Podiatry;  Laterality: Left;  left great toe   POLYPECTOMY  11/12/2018   Procedure: POLYPECTOMY;  Surgeon: Laurence Spates, MD;  [facility];  Service: Endoscopy;;   SPINAL CORD STIMULATOR INSERTION N/A 12/20/2020   Procedure: Permanent Spinal Cord Stimulator  Lead Implant with Fluoroscopy and Battery Implant;  Surgeon: Reece Agar, MD;  Location: [facility];  Service: Neurosurgery;  Laterality: N/A;   TUMOR REMOVAL     small intestine x3   VISCERAL ANGIOGRAPHY N/A 08/02/2019   Procedure: VISCERAL ANGIOGRAPHY;  Surgeon: Waynetta Sandy, MD;  Location: [facility];  Service: Cardiovascular;  Laterality: N/A;    Home Medications:  Allergies as of 06/06/2021       Reactions   Hydrocodone Itching   Other    Opiods cause hallucinations and delusions has to be  given risperidone to settle down   Oxycodone-acetaminophen Other (See Comments)        Medication List        Accurate as of June 06, 2021  3:16 PM. If you have any questions, ask your nurse or doctor.          albuterol 108 (90 Base) MCG/ACT inhaler Commonly known as: VENTOLIN HFA Inhale 2 puffs into the lungs every 6 (six) hours as needed for wheezing or shortness of breath.   AMBULATORY NON FORMULARY MEDICATION Medication Name: Trimix  PGE 30 mcg Pap 30 mg Phent 1 mg  0.2 mls by intracavernosal route as needed   aspirin 81 MG EC tablet Take 1 tablet (81 mg total) by mouth daily.   atorvastatin 40 MG tablet Commonly known as: LIPITOR Take 1 tablet by mouth once  daily   beclomethasone 80 MCG/ACT inhaler Commonly known as: QVAR Inhale 2 puffs into the lungs in the morning and at bedtime.   CALCIFEROL PO Take 0.25 mg by mouth 3 (three) times a week.   CALCIUM CITRATE PO Take 600 mg by mouth daily.   carvedilol 25 MG tablet Commonly known as: COREG Take 25 mg by mouth 2 (two) times daily with a meal.   clobetasol 0.05 % external solution Commonly known as: TEMOVATE Apply 1 application topically 2 (two) times daily as needed (itching).   denosumab 60 MG/ML Sosy injection Commonly known as: PROLIA Inject 60 mg into the skin every 6 (six) months.   fluticasone 0.05 % cream Commonly known as: CUTIVATE Apply topically.   magnesium oxide 400 MG tablet Commonly known as: MAG-OX Take 400 mg by mouth daily.   Melatonin 10 MG Tabs Take 20 mg by mouth at bedtime.   multivitamin Tabs tablet Take 1 tablet by mouth daily.   nitroGLYCERIN 0.4 MG SL tablet Commonly known as: NITROSTAT Place 0.4 mg under the tongue every 5 (five) minutes as needed for chest pain.   omeprazole 20 MG capsule Commonly known as: PRILOSEC Take 20 mg by mouth daily.   Potassium Chloride ER 20 MEQ Tbcr Take 20 mEq by mouth daily.   risperiDONE 0.5 MG tablet Commonly known  as: RISPERDAL Take 0.5 mg by mouth at bedtime.   rOPINIRole 1 MG tablet Commonly known as: REQUIP Take 1 mg by mouth daily.   senna-docusate 8.6-50 MG tablet Commonly known as: Senokot-S Take 2 tablets by mouth at bedtime.   sertraline 100 MG tablet Commonly known as: ZOLOFT Take 100 mg by mouth in the morning.   Vitamin D3 50 MCG (2000 UT) Tabs Take 2,000 Units by mouth daily.        Allergies:  Allergies  Allergen Reactions   Hydrocodone Itching   Other     Opiods cause hallucinations and delusions has to be given risperidone to settle down   Oxycodone-Acetaminophen Other (See Comments)    Family History: Family History  Problem Relation Age of Onset   Diabetes Mother    Alcohol abuse Father     Social History:  reports that he quit smoking about 18 months ago. His smoking use included cigarettes. He started smoking about 53 years ago. He has never used smokeless tobacco. He reports that he does not currently use alcohol. He reports that he does not use drugs.  ROS: All other review of systems were reviewed and are negative except what is noted above in HPI  Physical Exam: BP 128/70    Pulse 63    Wt 240 lb (108.9 kg)    BMI 29.21 kg/m   Constitutional:  Alert and oriented, No acute distress. HEENT: [place] AT, moist mucus membranes.  Trachea midline, no masses. Cardiovascular: No clubbing, cyanosis, or edema. Respiratory: Normal respiratory effort, no increased work of breathing. GI: Abdomen is soft, nontender, nondistended, no abdominal masses GU: No CVA tenderness.  Lymph: No cervical or inguinal lymphadenopathy. Skin: No rashes, bruises or suspicious lesions. Neurologic: Grossly intact, no focal deficits, moving all 4 extremities. Psychiatric: Normal mood and affect.  Laboratory Data: Lab Results  Component Value Date   WBC 9.9 12/18/2020   HGB 13.9 12/20/2020   HCT 41.0 12/20/2020   MCV 110.8 (H) 12/18/2020   PLT 174 12/18/2020    Lab Results   Component Value Date   CREATININE 6.00 (H) 12/20/2020    No results  found for: PSA  Lab Results  Component Value Date   TESTOSTERONE 297 07/28/2015    Lab Results  Component Value Date   HGBA1C 5.5 12/18/2020    Urinalysis    Component Value Date/Time   COLORURINE YELLOW 07/22/2019 1625   APPEARANCEUR Hazy (A) 09/25/2020 1149   LABSPEC 1.010 07/22/2019 1625   PHURINE 6.0 07/22/2019 1625   GLUCOSEU Negative 09/25/2020 1149   HGBUR NEGATIVE 07/22/2019 1625   BILIRUBINUR Negative 09/25/2020 1149   KETONESUR NEGATIVE 07/22/2019 1625   PROTEINUR 2+ (A) 09/25/2020 1149   PROTEINUR >=300 (A) 07/22/2019 1625   UROBILINOGEN 0.2 09/27/2014 0027   NITRITE Negative 09/25/2020 1149   NITRITE NEGATIVE 07/22/2019 1625   LEUKOCYTESUR 1+ (A) 09/25/2020 1149   LEUKOCYTESUR NEGATIVE 07/22/2019 1625    Lab Results  Component Value Date   LABMICR See below: 09/25/2020   WBCUA 11-30 (A) 09/25/2020   LABEPIT 0-10 09/25/2020   MUCUS Present 09/11/2020   BACTERIA Few (A) 09/25/2020    Pertinent Imaging:  No results found for this or any previous visit.  No results found for this or any previous visit.  No results found for this or any previous visit.  No results found for this or any previous visit.  Results for orders placed in visit on 09/15/20  US RENAL  Narrative CLINICAL DATA:  Recurrent urinary tract infection.  EXAM: RENAL / URINARY TRACT ULTRASOUND COMPLETE  COMPARISON:  Renal ultrasound 04/05/2016.  Abdominal CT 07/28/2019.  FINDINGS: Right Kidney:  Renal measurements: 10.3 x 5.1 x 4.0 cm = volume: 109.8 mL. Mild renal cortical thinning and increased echogenicity. There is a cystic lesion in the upper pole which measures up to 1.8 cm. Prominent renal sinus fat. No hydronephrosis or suspicious cortical lesion.  Left Kidney:  Renal measurements: 10.5 x 6.2 x 4.2 cm = volume: 140.9 mL. Mild renal cortical thinning and increased echogenicity. Prominent  renal sinus fat. No hydronephrosis or focal cortical lesion.  Bladder:  Not visualized.  Other:  Study is mildly limited by body habitus.  No evidence of ascites.  IMPRESSION: 1. Both kidneys demonstrate mild cortical thinning and increased cortical echogenicity consistent with "chronic medical renal disease". 2. Small cyst in the upper pole of the right kidney. No hydronephrosis. 3. The bladder was not visualized.   Electronically Signed By: Richardean Sale M.D. On: 09/18/2020 12:11  No results found for this or any previous visit.  No results found for this or any previous visit.  No results found for this or any previous visit.  Trimix injection instruction:  Patient instructed to draw 0.56ml of trimix into the insulin syringe. I them instructed him to inject at the mid penile shaft at either the 3 or 9 o'clock position. I then injected the patient and he achieved a good erection in 20 minutes. Penile injection completed.   Assessment & Plan:    Erectile dysfunction The patient was instructed on proper technique for trimix injection. He is instructed to alternate side and location of the injection. He was instructed in titrating the trimix dose. He is instructed to call the office for an erection lasting more than 4 hours   No follow-ups on file.  Nicolette Bang, MD  [facility] [place]

## 2021-06-12 ENCOUNTER — Encounter: Payer: Self-pay | Admitting: Urology

## 2021-06-12 NOTE — Patient Instructions (Signed)
Erectile Dysfunction °Erectile dysfunction (ED) is the inability to get or keep an erection in order to have sexual intercourse. ED is considered a symptom of an underlying disorder and is not considered a disease. ED may include: °Inability to get an erection. °Lack of enough hardness of the erection to allow penetration. °Loss of erection before sex is finished. °What are the causes? °This condition may be caused by: °Physical causes, such as: °Artery problems. This may include heart disease, high blood pressure, atherosclerosis, and diabetes. °Hormonal problems, such as low testosterone. °Obesity. °Nerve problems. This may include back or pelvic injuries, multiple sclerosis, Parkinson's disease, spinal cord injury, and stroke. °Certain medicines, such as: °Pain relievers. °Antidepressants. °Blood pressure medicines and water pills (diuretics). °Cancer medicines. °Antihistamines. °Muscle relaxants. °Lifestyle factors, such as: °Use of drugs such as marijuana, cocaine, or opioids. °Excessive use of alcohol. °Smoking. °Lack of physical activity or exercise. °Psychological causes, such as: °Anxiety or stress. °Sadness or depression. °Exhaustion. °Fear about sexual performance. °Guilt. °What are the signs or symptoms? °Symptoms of this condition include: °Inability to get an erection. °Lack of enough hardness of the erection to allow penetration. °Loss of the erection before sex is finished. °Sometimes having normal erections, but with frequent unsatisfactory episodes. °Low sexual satisfaction in either partner due to erection problems. °A curved penis occurring with erection. The curve may cause pain, or the penis may be too curved to allow for intercourse. °Never having nighttime or morning erections. °How is this diagnosed? °This condition is often diagnosed by: °Performing a physical exam to find other diseases or specific problems with the penis. °Asking you detailed questions about the problem. °Doing tests,  such as: °Blood tests to check for diabetes mellitus or high cholesterol, or to measure hormone levels. °Other tests to check for underlying health conditions. °An ultrasound exam to check for scarring. °A test to check blood flow to the penis. °Doing a sleep study at home to measure nighttime erections. °How is this treated? °This condition may be treated by: °Medicines, such as: °Medicine taken by mouth to help you achieve an erection (oral medicine). °Hormone replacement therapy to replace low testosterone levels. °Medicine that is injected into the penis. Your health care provider may instruct you how to give yourself these injections at home. °Medicine that is delivered with a short applicator tube. The tube is inserted into the opening at the tip of the penis, which is the opening of the urethra. A tiny pellet of medicine is put in the urethra. The pellet dissolves and enhances erectile function. This is also called MUSE (medicated urethral system for erections) therapy. °Vacuum pump. This is a pump with a ring on it. The pump and ring are placed on the penis and used to create pressure that helps the penis become erect. °Penile implant surgery. In this procedure, you may receive: °An inflatable implant. This consists of cylinders, a pump, and a reservoir. The cylinders can be inflated with a fluid that helps to create an erection, and they can be deflated after intercourse. °A semi-rigid implant. This consists of two silicone rubber rods. The rods provide some rigidity. They are also flexible, so the penis can both curve downward in its normal position and become straight for sexual intercourse. °Blood vessel surgery to improve blood flow to the penis. During this procedure, a blood vessel from a different part of the body is placed into the penis to allow blood to flow around (bypass) damaged or blocked blood vessels. °Lifestyle changes,   such as exercising more, losing weight, and quitting smoking. °Follow  these instructions at home: °Medicines ° °Take over-the-counter and prescription medicines only as told by your health care provider. Do not increase the dosage without first discussing it with your health care provider. °If you are using self-injections, do injections as directed by your health care provider. Make sure you avoid any veins that are on the surface of the penis. After giving an injection, apply pressure to the injection site for 5 minutes. °Talk to your health care provider about how to prevent headaches while taking ED medicines. These medicines may cause a sudden headache due to the increase in blood flow in your body. °General instructions °Exercise regularly, as directed by your health care provider. Work with your health care provider to lose weight, if needed. °Do not use any products that contain nicotine or tobacco. These products include cigarettes, chewing tobacco, and vaping devices, such as e-cigarettes. If you need help quitting, ask your health care provider. °Before using a vacuum pump, read the instructions that come with the pump and discuss any questions with your health care provider. °Keep all follow-up visits. This is important. °Contact a health care provider if: °You feel nauseous. °You are vomiting. °You get sudden headaches while taking ED medicines. °You have any concerns about your sexual health. °Get help right away if: °You are taking oral or injectable medicines and you have an erection that lasts longer than 4 hours. If your health care provider is unavailable, go to the nearest emergency room for evaluation. An erection that lasts much longer than 4 hours can result in permanent damage to your penis. °You have severe pain in your groin or abdomen. °You develop redness or severe swelling of your penis. °You have redness spreading at your groin or lower abdomen. °You are unable to urinate. °You experience chest pain or a rapid heartbeat (palpitations) after taking oral  medicines. °These symptoms may represent a serious problem that is an emergency. Do not wait to see if the symptoms will go away. Get medical help right away. Call your local emergency services (911 in the [place] Do not drive yourself to the hospital. °Summary °Erectile dysfunction (ED) is the inability to get or keep an erection during sexual intercourse. °This condition is diagnosed based on a physical exam, your symptoms, and tests to determine the cause. Treatment varies depending on the cause and may include medicines, hormone therapy, surgery, or a vacuum pump. °You may need follow-up visits to make sure that you are using your medicines or devices correctly. °Get help right away if you are taking or injecting medicines and you have an erection that lasts longer than 4 hours. °This information is not intended to replace advice given to you by your health care provider. Make sure you discuss any questions you have with your health care provider. °Document Revised: 08/02/2020 Document Reviewed: 08/02/2020 °[employer] Patient Education © 2022 [employer] ° °

## 2021-06-13 NOTE — Progress Notes (Signed)
Remote pacemaker transmission.   

## 2021-06-20 ENCOUNTER — Ambulatory Visit: Payer: Medicare Other | Admitting: Urology

## 2021-07-02 ENCOUNTER — Telehealth: Payer: Self-pay

## 2021-07-02 NOTE — Telephone Encounter (Signed)
Patient called to let MD know that the Trimix injection is not working for him.  Message sent to MD

## 2021-07-03 ENCOUNTER — Other Ambulatory Visit: Payer: Self-pay

## 2021-07-03 DIAGNOSIS — N5201 Erectile dysfunction due to arterial insufficiency: Secondary | ICD-10-CM

## 2021-07-03 MED ORDER — AMBULATORY NON FORMULARY MEDICATION: 5 refills | Status: DC

## 2021-07-03 NOTE — Telephone Encounter (Signed)
Wife notified of dose increased to 0.16ml. voiced understanding will send in new rx to reflect change  [facility]

## 2021-08-07 ENCOUNTER — Other Ambulatory Visit: Payer: Self-pay | Admitting: Gastroenterology

## 2021-08-22 ENCOUNTER — Other Ambulatory Visit: Payer: Self-pay | Admitting: Neurological Surgery

## 2021-08-22 DIAGNOSIS — M961 Postlaminectomy syndrome, not elsewhere classified: Secondary | ICD-10-CM

## 2021-08-29 ENCOUNTER — Ambulatory Visit
Admission: RE | Admit: 2021-08-29 | Discharge: 2021-08-29 | Disposition: A | Discharge status: Home / Self Care | Payer: Medicare Other | Source: Ambulatory Visit | Attending: Neurological Surgery | Admitting: Neurological Surgery

## 2021-08-29 DIAGNOSIS — M961 Postlaminectomy syndrome, not elsewhere classified: Secondary | ICD-10-CM

## 2021-08-29 MED ORDER — ONDANSETRON HCL 4 MG/2ML IJ SOLN: 4.0000 mg | Freq: Once | INTRAMUSCULAR | Status: DC | PRN

## 2021-08-29 MED ORDER — IOPAMIDOL (ISOVUE-M 200) INJECTION 41%
20.0000 mL | Freq: Once | INTRAMUSCULAR | Status: AC
  Administered 2021-08-29: 20 mL via INTRATHECAL

## 2021-08-29 MED ORDER — MEPERIDINE HCL 50 MG/ML IJ SOLN: 50.0000 mg | Freq: Once | INTRAMUSCULAR | Status: DC | PRN

## 2021-08-29 MED ORDER — DIAZEPAM 5 MG PO TABS
5.0000 mg | ORAL_TABLET | Freq: Once | ORAL | Status: AC
  Administered 2021-08-29: 5 mg via ORAL

## 2021-08-29 NOTE — Progress Notes (Signed)
Pt has spinal cord stimulator and reports he has turned it off for CT myelogram procedure.   ?

## 2021-08-29 NOTE — Discharge Instructions (Signed)
Myelogram Discharge Instructions ? ?Go home and rest quietly as needed. You may resume normal activities; however, do not exert yourself strongly or do any heavy lifting today and tomorrow.  ? ?DO NOT drive today.   ? ?You may resume your normal diet and medications unless otherwise indicated. Drink lots of extra fluids today and tomorrow.  ? ?The incidence of headache, nausea, or vomiting is about 5% (one in 20 patients).  If you develop a headache, lie flat for 24 hours and drink plenty of fluids until the headache goes away.  Caffeinated beverages may be helpful. If when you get up you still have a headache when standing, go back to bed and force fluids for another 24 hours.  ? ?If you develop severe nausea and vomiting or a headache that does not go away with the flat bedrest after 48 hours, please call [phone].  ? ?Call your physician for a follow-up appointment.  The results of your myelogram will be sent directly to your physician by the following day. ? ?If you have any questions or if complications develop after you arrive home, please call [phone]. ? ?Discharge instructions have been explained to the patient.  The patient, or the person responsible for the patient, fully understands these instructions.  ? ?Thank you for visiting our office today. ? ?MAY RESUME ASPIRIN IMMEDIATELY AFTER PROCEDURE! ?   ?

## 2021-08-31 ENCOUNTER — Ambulatory Visit: Payer: Medicare Other | Admitting: Urology

## 2021-09-03 ENCOUNTER — Ambulatory Visit (INDEPENDENT_AMBULATORY_CARE_PROVIDER_SITE_OTHER): Payer: Medicare Other

## 2021-09-03 DIAGNOSIS — I495 Sick sinus syndrome: Secondary | ICD-10-CM

## 2021-09-04 LAB — CUP PACEART REMOTE DEVICE CHECK
Battery Remaining Longevity: 138 mo
Battery Remaining Percentage: 100 %
Brady Statistic RA Percent Paced: 30 %
Brady Statistic RV Percent Paced: 30 %
Date Time Interrogation Session: 20230417044100
Implantable Lead Implant Date: 20190514
Implantable Lead Implant Date: 20190514
Implantable Lead Location: 753859
Implantable Lead Location: 753860
Implantable Lead Model: 7741
Implantable Lead Model: 7742
Implantable Lead Serial Number: 1015225
Implantable Lead Serial Number: 1020907
Implantable Pulse Generator Implant Date: 20190514
Lead Channel Impedance Value: 1058 Ohm
Lead Channel Impedance Value: 752 Ohm
Lead Channel Pacing Threshold Amplitude: 1.5 V
Lead Channel Pacing Threshold Pulse Width: 0.4 ms
Lead Channel Setting Pacing Amplitude: 2 V
Lead Channel Setting Pacing Amplitude: 2.5 V
Lead Channel Setting Pacing Pulse Width: 0.4 ms
Lead Channel Setting Sensing Sensitivity: 1.5 mV
Pulse Gen Serial Number: 832937

## 2021-09-13 ENCOUNTER — Other Ambulatory Visit: Payer: Self-pay | Admitting: Cardiology

## 2021-09-13 NOTE — Telephone Encounter (Signed)
Contacted patient and verified does and direction for carvedilol ?Confirmed that he takes carvedilol 25 mg twice daily ?

## 2021-09-19 ENCOUNTER — Ambulatory Visit (INDEPENDENT_AMBULATORY_CARE_PROVIDER_SITE_OTHER): Payer: Medicare Other | Admitting: Urology

## 2021-09-19 VITALS — BP 158/65 | HR 60

## 2021-09-19 DIAGNOSIS — R339 Retention of urine, unspecified: Secondary | ICD-10-CM | POA: Diagnosis not present

## 2021-09-19 DIAGNOSIS — N401 Enlarged prostate with lower urinary tract symptoms: Secondary | ICD-10-CM

## 2021-09-19 DIAGNOSIS — N138 Other obstructive and reflux uropathy: Secondary | ICD-10-CM | POA: Diagnosis not present

## 2021-09-19 DIAGNOSIS — N5201 Erectile dysfunction due to arterial insufficiency: Secondary | ICD-10-CM

## 2021-09-19 NOTE — Progress Notes (Signed)
Remote pacemaker transmission.   

## 2021-09-19 NOTE — Progress Notes (Signed)
? ?09/19/2021 ?3:38 PM  ? ?[name] ?29-Oct-1950 ?[id number] ? ?Referring provider: Tobe Sos, MD ?[address] ?[place] [address] ? ?Followup erectile dysfunction and BPH ? ? ?HPI: ?[name] is a 71yo here for followup for erectile dysfunction and BPH. IPSS 6 QOl 1. Nocturia 1x. Urine stream strong. He uses trimix which failed to give him a firm erection. No other complaints today ? ? ?PMH: ?Past Medical History:  ?Diagnosis Date  ? AAA (abdominal aortic aneurysm) 06/2016  ? Mild increase in size of 3.4 cm infrarenal abdominal aortic aneurysm  ? Anemia   ? Aortic atherosclerosis ([place])   ? Arthritis   ? Asthma   ? Atrial flutter ([place])   ? AVF (arteriovenous fistula) ([place])   ? Left forearm  ? CAD (coronary artery disease)   ? a. s/p prior stenting of RCA in 1999 b. cath in 2003 showing moderate CAD c. NST in 2016 showing small area of ischemic and low-risk  ? CHF (congestive heart failure) ([place])   ? CKD (chronic kidney disease), stage V ([place])   ? kidney transplant evaluation  ? COPD (chronic obstructive pulmonary disease) ([place])   ? with ongoing tobacco use and patient failed sham takes  ? Diverticulosis 2018  ? Mild sigmoid colon diverticulosis.  ? DM2 (diabetes mellitus, type 2) ([place])   ? Dyslipidemia   ? Dysrhythmia 2018  ? atrial fibrillation  ? Edema   ? chronic lower extremity secondary to right heart failure and chronic venous insufficiency  ? Fatigue   ? Fatty liver 2010  ? Mild  ? Headache   ? HTN (hypertension)   ? Morbid obesity ([place])   ? Multiple pulmonary nodules 05/2018  ? Bilateral  ? Myocardial infarction [facility])   ? Osteoporosis   ? Pneumonia   ? Presence of permanent cardiac pacemaker   ? PVD (peripheral vascular disease) ([place])   ? 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   ? Reflux esophagitis   ? hx  ? Two-vessel coronary artery disease   ? moderate. by cath in 2003. Status post stenting of the mdi RCA November 1999 normal left ventricular ejection fraction  ? Wears  glasses   ? Wears hearing aid   ? B/L  ? ? ?Surgical History: ?Past Surgical History:  ?Procedure Laterality Date  ? A/V FISTULAGRAM Left 04/30/2019  ? Procedure: A/V FISTULAGRAM;  Surgeon: Elam Dutch, MD;  Location: [facility];  Service: Cardiovascular;  Laterality: Left;  ? A/V FISTULAGRAM Left 09/22/2019  ? Procedure: A/V FISTULAGRAM;  Surgeon: Marty Heck, MD;  Location: [facility];  Service: Cardiovascular;  Laterality: Left;  ? ABDOMINAL SURGERY    ? APPENDECTOMY    ? AV FISTULA PLACEMENT Right 03/11/2018  ? Procedure: CREATION Brachiocephalic Fistula RIGHT ARM;  Surgeon: Rosetta Posner, MD;  Location: [facility];  Service: Vascular;  Laterality: Right;  ? AV FISTULA PLACEMENT Left 05/06/2019  ? Procedure: LEFT BRACHIOCEPHALIC ARTERIOVENOUS (AV) FISTULA CREATION;  Surgeon: Marty Heck, MD;  Location: [place];  Service: Vascular;  Laterality: Left;  ? BACK SURGERY    ? x3; cages in patient's back since 2000  ? BIOPSY  11/12/2018  ? Procedure: BIOPSY;  Surgeon: Laurence Spates, MD;  [facility];  Service: Endoscopy;;  ? CARDIAC CATHETERIZATION    ? stent  ? CHOLECYSTECTOMY    ? CLOSED REDUCTION SHOULDER DISLOCATION    ? COLONOSCOPY W/ BIOPSIES AND POLYPECTOMY    ?  COLONOSCOPY WITH PROPOFOL N/A 11/12/2018  ? Procedure: COLONOSCOPY WITH PROPOFOL;  Surgeon: Laurence Spates, MD;  [facility];  Service: Endoscopy;  Laterality: N/A;  ? CORONARY ANGIOPLASTY    ? CYSTOSCOPY WITH INSERTION OF UROLIFT N/A 11/06/2020  ? Procedure: CYSTOSCOPY WITH INSERTION OF UROLIFT;  Surgeon: Cleon Gustin, MD;  [facility];  Service: Urology;  Laterality: N/A;  ? diabetic ulcers    ? DIAGNOSTIC LAPAROSCOPY    ? DIALYSIS/PERMA CATHETER REMOVAL N/A 09/22/2019  ? Procedure: DIALYSIS/PERMA CATHETER REMOVAL;  Surgeon: Marty Heck, MD;  Location: [facility];  Service: Cardiovascular;  Laterality: N/A;  ? ESOPHAGOGASTRODUODENOSCOPY (EGD) WITH PROPOFOL N/A  11/12/2018  ? Procedure: ESOPHAGOGASTRODUODENOSCOPY (EGD) WITH PROPOFOL;  Surgeon: Laurence Spates, MD;  [facility];  Service: Endoscopy;  Laterality: N/A;  ? GROIN EXPLORATION    ? HEMOSTASIS CLIP PLACEMENT  11/12/2018  ? Procedure: HEMOSTASIS CLIP PLACEMENT;  Surgeon: Laurence Spates, MD;  [facility];  Service: Endoscopy;;  ? HIP ARTHROPLASTY Left   ? gamma nail  ? INSERT / REPLACE / REMOVE PACEMAKER    ? INSERTION OF ARTERIOVENOUS (AV) ARTEGRAFT ARM Left 03/18/2018  ? Procedure: INSERTION OF ARTERIOVENOUS (AV) FISTULA;  Surgeon: Rosetta Posner, MD;  Location: [place];  Service: Vascular;  Laterality: Left;  ? IR FLUORO GUIDE CV LINE RIGHT  04/11/2019  ? IR US GUIDE VASC ACCESS RIGHT  04/11/2019  ? LEFT HEART CATH AND CORONARY ANGIOGRAPHY N/A 07/23/2019  ? Procedure: LEFT HEART CATH AND CORONARY ANGIOGRAPHY;  Surgeon: Martinique, Peter M, MD;  Location: [facility] [facility];  Service: Cardiovascular;  Laterality: N/A;  ? LIGATION ARTERIOVENOUS GORTEX GRAFT Right 03/18/2018  ? Procedure: LIGATION ARTERIOVENOUS FISTULA;  Surgeon: Rosetta Posner, MD;  Location: [place];  Service: Vascular;  Laterality: Right;  ? OSTECTOMY Left 04/23/2017  ? Procedure: OSTECTOMY LEFT GREAT TOE;  Surgeon: [name], DPM;  [facility];  Service: Podiatry;  Laterality: Left;  left great toe  ? POLYPECTOMY  11/12/2018  ? Procedure: POLYPECTOMY;  Surgeon: Laurence Spates, MD;  [facility];  Service: Endoscopy;;  ? SPINAL CORD STIMULATOR INSERTION N/A 12/20/2020  ? Procedure: Permanent Spinal Cord Stimulator  Lead Implant with Fluoroscopy and Battery Implant;  Surgeon: Reece Agar, MD;  Location: [place];  Service: Neurosurgery;  Laterality: N/A;  ? TUMOR REMOVAL    ? small intestine x3  ? VISCERAL ANGIOGRAPHY N/A 08/02/2019  ? Procedure: VISCERAL ANGIOGRAPHY;  Surgeon: Waynetta Sandy, MD;  Location: [place] [facility];  Service: Cardiovascular;  Laterality: N/A;  ? ? ?Home Medications:   ?Allergies as of 09/19/2021   ? ?   Reactions  ? Hydrocodone Itching  ? Other   ? Opiods cause hallucinations and delusions has to be given risperidone to settle down ?Other reaction(s): hallucinations/delirium  ? Oxycodone-acetaminophen Other (See Comments)  ? ?  ? ?  ?Medication List  ?  ? ?  ? Accurate as of Sep 19, 2021  3:38 PM. If you have any questions, ask your nurse or doctor.  ?  ?  ? ?  ? ?albuterol 108 (90 Base) MCG/ACT inhaler ?Commonly known as: VENTOLIN HFA ?Inhale 2 puffs into the lungs every 6 (six) hours as needed for wheezing or shortness of breath. ?  ?AMBULATORY NON FORMULARY MEDICATION ?Medication Name: Trimix ? ?PGE 30 mcg ?Pap 30 mg ?Phent 1 mg ? ?0.3 mls by intracavernosal route as needed ?  ?amLODipine 5 MG tablet ?Commonly known  as: [place] ?Take 5 mg by mouth daily. ?  ?aspirin 81 MG EC tablet ?Take 1 tablet (81 mg total) by mouth daily. ?  ?atorvastatin 40 MG tablet ?Commonly known as: LIPITOR ?Take 1 tablet by mouth once daily ?  ?beclomethasone 80 MCG/ACT inhaler ?Commonly known as: QVAR ?Inhale 2 puffs into the lungs in the morning and at bedtime. ?  ?CALCIFEROL PO ?Take 0.25 mg by mouth 3 (three) times a week. ?  ?CALCIUM CITRATE PO ?Take 600 mg by mouth in the morning and at bedtime. ?  ?carvedilol 25 MG tablet ?Commonly known as: COREG ?Take 1 tablet (25 mg total) by mouth 2 (two) times daily with a meal. ?  ?clobetasol 0.05 % external solution ?Commonly known as: TEMOVATE ?Apply 1 application topically 2 (two) times daily as needed (itching). ?  ?denosumab 60 MG/ML Sosy injection ?Commonly known as: PROLIA ?Inject 60 mg into the skin every 6 (six) months. ?  ?fluticasone 0.05 % cream ?Commonly known as: CUTIVATE ?Apply topically. ?  ?linaclotide 290 MCG Caps capsule ?Commonly known as: LINZESS ?1 cap(s) orally once a day for 30 day(s) ?  ?magnesium oxide 400 MG tablet ?Commonly known as: MAG-OX ?Take 400 mg by mouth daily. ?  ?Melatonin 10 MG Tabs ?Take 20 mg by mouth at bedtime. ?   ?multivitamin Tabs tablet ?Take 1 tablet by mouth daily. ?  ?nitroGLYCERIN 0.4 MG SL tablet ?Commonly known as: NITROSTAT ?Place 0.4 mg under the tongue every 5 (five) minutes as needed for chest pain. ?  ?omeprazole 20 MG

## 2021-09-25 ENCOUNTER — Encounter (HOSPITAL_COMMUNITY): Payer: Self-pay | Admitting: Gastroenterology

## 2021-09-25 NOTE — Progress Notes (Signed)
Attempted to obtain medical history via telephone, unable to reach at this time. I left a voicemail to return pre surgical testing department's, listed phone number not in service.  ?

## 2021-10-01 ENCOUNTER — Encounter: Payer: Self-pay | Admitting: Urology

## 2021-10-01 NOTE — Patient Instructions (Signed)
Erectile Dysfunction ?Erectile dysfunction (ED) is the inability to get or keep an erection in order to have sexual intercourse. ED is considered a symptom of an underlying disorder and is not considered a disease. ED may include: ?Inability to get an erection. ?Lack of enough hardness of the erection to allow penetration. ?Loss of erection before sex is finished. ?What are the causes? ?This condition may be caused by: ?Physical causes, such as: ?Artery problems. This may include heart disease, high blood pressure, atherosclerosis, and diabetes. ?Hormonal problems, such as low testosterone. ?Obesity. ?Nerve problems. This may include back or pelvic injuries, multiple sclerosis, Parkinson's disease, spinal cord injury, and stroke. ?Certain medicines, such as: ?Pain relievers. ?Antidepressants. ?Blood pressure medicines and water pills (diuretics). ?Cancer medicines. ?Antihistamines. ?Muscle relaxants. ?Lifestyle factors, such as: ?Use of drugs such as marijuana, cocaine, or opioids. ?Excessive use of alcohol. ?Smoking. ?Lack of physical activity or exercise. ?Psychological causes, such as: ?Anxiety or stress. ?Sadness or depression. ?Exhaustion. ?Fear about sexual performance. ?Guilt. ?What are the signs or symptoms? ?Symptoms of this condition include: ?Inability to get an erection. ?Lack of enough hardness of the erection to allow penetration. ?Loss of the erection before sex is finished. ?Sometimes having normal erections, but with frequent unsatisfactory episodes. ?Low sexual satisfaction in either partner due to erection problems. ?A curved penis occurring with erection. The curve may cause pain, or the penis may be too curved to allow for intercourse. ?Never having nighttime or morning erections. ?How is this diagnosed? ?This condition is often diagnosed by: ?Performing a physical exam to find other diseases or specific problems with the penis. ?Asking you detailed questions about the problem. ?Doing tests,  such as: ?Blood tests to check for diabetes mellitus or high cholesterol, or to measure hormone levels. ?Other tests to check for underlying health conditions. ?An ultrasound exam to check for scarring. ?A test to check blood flow to the penis. ?Doing a sleep study at home to measure nighttime erections. ?How is this treated? ?This condition may be treated by: ?Medicines, such as: ?Medicine taken by mouth to help you achieve an erection (oral medicine). ?Hormone replacement therapy to replace low testosterone levels. ?Medicine that is injected into the penis. Your health care provider may instruct you how to give yourself these injections at home. ?Medicine that is delivered with a short applicator tube. The tube is inserted into the opening at the tip of the penis, which is the opening of the urethra. A tiny pellet of medicine is put in the urethra. The pellet dissolves and enhances erectile function. This is also called MUSE (medicated urethral system for erections) therapy. ?Vacuum pump. This is a pump with a ring on it. The pump and ring are placed on the penis and used to create pressure that helps the penis become erect. ?Penile implant surgery. In this procedure, you may receive: ?An inflatable implant. This consists of cylinders, a pump, and a reservoir. The cylinders can be inflated with a fluid that helps to create an erection, and they can be deflated after intercourse. ?A semi-rigid implant. This consists of two silicone rubber rods. The rods provide some rigidity. They are also flexible, so the penis can both curve downward in its normal position and become straight for sexual intercourse. ?Blood vessel surgery to improve blood flow to the penis. During this procedure, a blood vessel from a different part of the body is placed into the penis to allow blood to flow around (bypass) damaged or blocked blood vessels. ?Lifestyle changes,   such as exercising more, losing weight, and quitting smoking. ?Follow  these instructions at home: ?Medicines ? ?Take over-the-counter and prescription medicines only as told by your health care provider. Do not increase the dosage without first discussing it with your health care provider. ?If you are using self-injections, do injections as directed by your health care provider. Make sure you avoid any veins that are on the surface of the penis. After giving an injection, apply pressure to the injection site for 5 minutes. ?Talk to your health care provider about how to prevent headaches while taking ED medicines. These medicines may cause a sudden headache due to the increase in blood flow in your body. ?General instructions ?Exercise regularly, as directed by your health care provider. Work with your health care provider to lose weight, if needed. ?Do not use any products that contain nicotine or tobacco. These products include cigarettes, chewing tobacco, and vaping devices, such as e-cigarettes. If you need help quitting, ask your health care provider. ?Before using a vacuum pump, read the instructions that come with the pump and discuss any questions with your health care provider. ?Keep all follow-up visits. This is important. ?Contact a health care provider if: ?You feel nauseous. ?You are vomiting. ?You get sudden headaches while taking ED medicines. ?You have any concerns about your sexual health. ?Get help right away if: ?You are taking oral or injectable medicines and you have an erection that lasts longer than 4 hours. If your health care provider is unavailable, go to the nearest emergency room for evaluation. An erection that lasts much longer than 4 hours can result in permanent damage to your penis. ?You have severe pain in your groin or abdomen. ?You develop redness or severe swelling of your penis. ?You have redness spreading at your groin or lower abdomen. ?You are unable to urinate. ?You experience chest pain or a rapid heartbeat (palpitations) after taking oral  medicines. ?These symptoms may represent a serious problem that is an emergency. Do not wait to see if the symptoms will go away. Get medical help right away. Call your local emergency services (911 in the [place] Do not drive yourself to the hospital. ?Summary ?Erectile dysfunction (ED) is the inability to get or keep an erection during sexual intercourse. ?This condition is diagnosed based on a physical exam, your symptoms, and tests to determine the cause. Treatment varies depending on the cause and may include medicines, hormone therapy, surgery, or a vacuum pump. ?You may need follow-up visits to make sure that you are using your medicines or devices correctly. ?Get help right away if you are taking or injecting medicines and you have an erection that lasts longer than 4 hours. ?This information is not intended to replace advice given to you by your health care provider. Make sure you discuss any questions you have with your health care provider. ?Document Revised: 08/02/2020 Document Reviewed: 08/02/2020 ?Elsevier Patient Education ? 2023 [employer] ? ?

## 2021-10-02 ENCOUNTER — Other Ambulatory Visit: Payer: Self-pay

## 2021-10-02 ENCOUNTER — Ambulatory Visit (HOSPITAL_COMMUNITY)
Admission: RE | Admit: 2021-10-02 | Discharge: 2021-10-02 | Disposition: A | Discharge status: Home / Self Care | Payer: Medicare Other | Attending: Gastroenterology | Admitting: Gastroenterology

## 2021-10-02 ENCOUNTER — Encounter (HOSPITAL_COMMUNITY): Payer: Self-pay | Admitting: Gastroenterology

## 2021-10-02 ENCOUNTER — Ambulatory Visit (HOSPITAL_BASED_OUTPATIENT_CLINIC_OR_DEPARTMENT_OTHER): Payer: Medicare Other | Admitting: Certified Registered Nurse Anesthetist

## 2021-10-02 ENCOUNTER — Encounter (HOSPITAL_COMMUNITY)
Admission: RE | Disposition: A | Discharge status: Home / Self Care | Payer: Self-pay | Source: Home / Self Care | Attending: Gastroenterology

## 2021-10-02 ENCOUNTER — Ambulatory Visit (HOSPITAL_COMMUNITY): Payer: Medicare Other | Admitting: Certified Registered Nurse Anesthetist

## 2021-10-02 DIAGNOSIS — D123 Benign neoplasm of transverse colon: Secondary | ICD-10-CM | POA: Diagnosis not present

## 2021-10-02 DIAGNOSIS — D122 Benign neoplasm of ascending colon: Secondary | ICD-10-CM | POA: Diagnosis not present

## 2021-10-02 DIAGNOSIS — I509 Heart failure, unspecified: Secondary | ICD-10-CM | POA: Insufficient documentation

## 2021-10-02 DIAGNOSIS — Z1211 Encounter for screening for malignant neoplasm of colon: Secondary | ICD-10-CM | POA: Diagnosis not present

## 2021-10-02 DIAGNOSIS — K648 Other hemorrhoids: Secondary | ICD-10-CM

## 2021-10-02 DIAGNOSIS — K644 Residual hemorrhoidal skin tags: Secondary | ICD-10-CM

## 2021-10-02 DIAGNOSIS — Z794 Long term (current) use of insulin: Secondary | ICD-10-CM | POA: Insufficient documentation

## 2021-10-02 DIAGNOSIS — I110 Hypertensive heart disease with heart failure: Secondary | ICD-10-CM | POA: Insufficient documentation

## 2021-10-02 DIAGNOSIS — E1151 Type 2 diabetes mellitus with diabetic peripheral angiopathy without gangrene: Secondary | ICD-10-CM | POA: Diagnosis not present

## 2021-10-02 DIAGNOSIS — D175 Benign lipomatous neoplasm of intra-abdominal organs: Secondary | ICD-10-CM | POA: Diagnosis not present

## 2021-10-02 DIAGNOSIS — K219 Gastro-esophageal reflux disease without esophagitis: Secondary | ICD-10-CM | POA: Insufficient documentation

## 2021-10-02 DIAGNOSIS — K635 Polyp of colon: Secondary | ICD-10-CM | POA: Insufficient documentation

## 2021-10-02 DIAGNOSIS — J449 Chronic obstructive pulmonary disease, unspecified: Secondary | ICD-10-CM | POA: Diagnosis not present

## 2021-10-02 DIAGNOSIS — Z87891 Personal history of nicotine dependence: Secondary | ICD-10-CM | POA: Diagnosis not present

## 2021-10-02 DIAGNOSIS — Z86010 Personal history of colonic polyps: Secondary | ICD-10-CM | POA: Diagnosis present

## 2021-10-02 DIAGNOSIS — I4891 Unspecified atrial fibrillation: Secondary | ICD-10-CM | POA: Diagnosis not present

## 2021-10-02 DIAGNOSIS — M1990 Unspecified osteoarthritis, unspecified site: Secondary | ICD-10-CM | POA: Insufficient documentation

## 2021-10-02 DIAGNOSIS — I252 Old myocardial infarction: Secondary | ICD-10-CM | POA: Insufficient documentation

## 2021-10-02 DIAGNOSIS — I2510 Atherosclerotic heart disease of native coronary artery without angina pectoris: Secondary | ICD-10-CM | POA: Insufficient documentation

## 2021-10-02 HISTORY — PX: COLONOSCOPY WITH PROPOFOL: SHX5780

## 2021-10-02 HISTORY — PX: POLYPECTOMY: SHX5525

## 2021-10-02 LAB — POCT I-STAT, CHEM 8
BUN: 43 mg/dL — ABNORMAL HIGH (ref 8–23)
Calcium, Ion: 1.2 mmol/L (ref 1.15–1.40)
Chloride: 95 mmol/L — ABNORMAL LOW (ref 98–111)
Creatinine, Ser: 5.8 mg/dL — ABNORMAL HIGH (ref 0.61–1.24)
Glucose, Bld: 126 mg/dL — ABNORMAL HIGH (ref 70–99)
HCT: 43 % (ref 39.0–52.0)
Hemoglobin: 14.6 g/dL (ref 13.0–17.0)
Potassium: 3.8 mmol/L (ref 3.5–5.1)
Sodium: 135 mmol/L (ref 135–145)
TCO2: 30 mmol/L (ref 22–32)

## 2021-10-02 SURGERY — COLONOSCOPY WITH PROPOFOL
Anesthesia: Monitor Anesthesia Care

## 2021-10-02 MED ORDER — PROPOFOL 10 MG/ML IV BOLUS
INTRAVENOUS | Status: DC | PRN
  Administered 2021-10-02: 20 mg via INTRAVENOUS
  Administered 2021-10-02: 40 mg via INTRAVENOUS

## 2021-10-02 MED ORDER — PROPOFOL 1000 MG/100ML IV EMUL
INTRAVENOUS | Status: AC
  Filled 2021-10-02: qty 100

## 2021-10-02 MED ORDER — SODIUM CHLORIDE 0.9 % IV SOLN
INTRAVENOUS | Status: DC
  Administered 2021-10-02: 500 mL via INTRAVENOUS

## 2021-10-02 MED ORDER — PROPOFOL 500 MG/50ML IV EMUL
INTRAVENOUS | Status: DC | PRN
Start: 2021-10-02 — End: 2021-10-02
  Administered 2021-10-02: 125 ug/kg/min via INTRAVENOUS

## 2021-10-02 MED ORDER — PHENYLEPHRINE 80 MCG/ML (10ML) SYRINGE FOR IV PUSH (FOR BLOOD PRESSURE SUPPORT)
PREFILLED_SYRINGE | INTRAVENOUS | Status: DC | PRN
Start: 2021-10-02 — End: 2021-10-02
  Administered 2021-10-02 (×2): 80 ug via INTRAVENOUS

## 2021-10-02 SURGICAL SUPPLY — 22 items

## 2021-10-02 NOTE — Progress Notes (Signed)
[name] ?11:44 AM ? ?Subjective: ?Patient without any new problems since she was seen recently in the office and has no GI complaints and did well with this 2-day prep and has no other complaints ? ?Objective: ?Vital signs stable afebrile exam please see preassessment evaluation ? ?Assessment: ?History of colon polyps due for colon screening ? ?Plan: ?Okay to proceed with colonoscopy with anesthesia assistance ? ?[facility] ? ?office [phone] ?After 5PM or if no answer call [phone]  ?

## 2021-10-02 NOTE — Op Note (Signed)
[facility] ?Patient Name: [name] ?Procedure Date: 10/02/2021 ?MRN: [id number] ?Attending MD: Clarene Essex , MD ?Date of Birth: 1950/10/25 ?CSN: [id number] ?Age: 71 ?Admit Type: Outpatient ?Procedure:                Colonoscopy ?Indications:              High risk colon cancer surveillance: Personal  ?                          history of colonic polyps, Last colonoscopy: June  ?                          2020 ?Providers:                Clarene Essex, MD, Jaci Carrel, RN, Delana Meyer  ?                          [place], Technician, Heide Scales, CRNA ?Referring MD:              ?Medicines:                Monitored Anesthesia Care ?Complications:            No immediate complications. ?Estimated Blood Loss:     Estimated blood loss was minimal. ?Procedure:                Pre-Anesthesia Assessment: ?                          - Prior to the procedure, a History and Physical  ?                          was performed, and patient medications and  ?                          allergies were reviewed. The patient's tolerance of  ?                          previous anesthesia was also reviewed. The risks  ?                          and benefits of the procedure and the sedation  ?                          options and risks were discussed with the patient.  ?                          All questions were answered, and informed consent  ?                          was obtained. Prior Anticoagulants: The patient has  ?                          taken no previous anticoagulant or antiplatelet  ?                          agents except for  aspirin. ASA Grade Assessment:  ?                          III - A patient with severe systemic disease. After  ?                          reviewing the risks and benefits, the patient was  ?                          deemed in satisfactory condition to undergo the  ?                          procedure. ?                          After obtaining informed consent, the colonoscope  ?                           was passed under direct vision. Throughout the  ?                          procedure, the patient's blood pressure, pulse, and  ?                          oxygen saturations were monitored continuously. The  ?                          CF-HQ190L ([id number]) Olympus colonoscope was  ?                          introduced through the anus and advanced to the the  ?                          cecum, identified by appendiceal orifice and  ?                          ileocecal valve. The ileocecal valve, appendiceal  ?                          orifice, and rectum were photographed. The  ?                          colonoscopy was performed without difficulty. The  ?                          patient tolerated the procedure well. The quality  ?                          of the bowel preparation was adequate to identify  ?                          polyps 6 mm and larger in size. ?Scope In: 12:13:10 PM ?Scope Out: 12:34:58 PM ?Scope Withdrawal Time: 0 hours 17 minutes 2 seconds  ?Total Procedure Duration: 0 hours 21 minutes 48  seconds  ?Findings: ?     External and internal hemorrhoids were found during retroflexion, during  ?     perianal exam and during digital exam. The hemorrhoids were small. ?     Four semi-sessile polyps were found in the sigmoid colon and ascending  ?     colon. The polyps were small in size. These polyps were removed with a  ?     cold snare. Resection and retrieval were complete. ?     Two semi-sessile polyps were found in the transverse colon. The polyps  ?     were diminutive in size. These were biopsied with a cold forceps for  ?     histology. ?     There was a small lipoma, in the transverse colon. ?     The exam was otherwise without abnormality. ?Impression:               - External and internal hemorrhoids. ?                          - Four small polyps in the sigmoid colon and in the  ?                          ascending colon, removed with a cold snare.  ?                           Resected and retrieved. ?                          - Two diminutive polyps in the transverse colon.  ?                          Biopsied. ?                          - Small lipoma in the transverse colon. ?                          - The examination was otherwise normal. ?Moderate Sedation: ?     Not Applicable - Patient had care per Anesthesia. ?Recommendation:           - Patient has a contact number available for  ?                          emergencies. The signs and symptoms of potential  ?                          delayed complications were discussed with the  ?                          patient. Return to normal activities tomorrow.  ?                          Written discharge instructions were provided to the  ?                          patient. ?                          -  Soft diet today. ?                          - Continue present medications. ?                          - Await pathology results. ?                          - Repeat colonoscopy in 3 - 5 years for  ?                          surveillance based on pathology results. With same  ?                          2-day prep and if status post transplant increase  ?                          liquid intake as well ?                          - Return to GI office PRN. ?                          - Telephone GI clinic for pathology results in 1  ?                          week. ?                          - Telephone GI clinic if symptomatic PRN. ?Procedure Code(s):        --- Professional --- ?                          [id number], Colonoscopy, flexible; with removal of  ?                          tumor(s), polyp(s), or other lesion(s) by snare  ?                          technique ?                          45380, 59, Colonoscopy, flexible; with biopsy,  ?                          single or multiple ?Diagnosis Code(s):        --- Professional --- ?                          Z86.010, Personal history of colonic polyps ?                          K63.5, Polyp of  colon ?CPT copyright 2019 [employer] Association. All rights reserved. ?The codes documented in this report are preliminary and upon coder review may  ?be revised to meet current compliance requirements. ?Albert Dilling  Spyros Winch, MD ?10/02/2021 12:48:48 PM ?This report has been signed electronically. ?Number of Addenda: 0 ?

## 2021-10-02 NOTE — Anesthesia Postprocedure Evaluation (Signed)
Anesthesia Post Note ? ?Patient: [name] ? ?Procedure(s) Performed: COLONOSCOPY WITH PROPOFOL ?POLYPECTOMY ? ?  ? ?Patient location during evaluation: PACU ?Anesthesia Type: MAC ?Level of consciousness: awake and alert ?Pain management: pain level controlled ?Vital Signs Assessment: post-procedure vital signs reviewed and stable ?Respiratory status: spontaneous breathing, nonlabored ventilation, respiratory function stable and patient connected to nasal cannula oxygen ?Cardiovascular status: stable and blood pressure returned to baseline ?Postop Assessment: no apparent nausea or vomiting ?Anesthetic complications: no ? ? ?No notable events documented. ? ?Last Vitals:  ?Vitals:  ? 10/02/21 1310 10/02/21 1314  ?BP: (!) 135/59   ?Pulse: 60   ?Resp: 20   ?Temp:    ?SpO2: 94% 98%  ?  ?Last Pain:  ?Vitals:  ? 10/02/21 1310  ?TempSrc:   ?PainSc: 0-No pain  ? ? ?  ?  ?  ?  ?  ?  ? ?Effie Berkshire ? ? ? ? ?

## 2021-10-02 NOTE — Discharge Instructions (Addendum)
YOU HAD AN ENDOSCOPIC PROCEDURE TODAY: Refer to the procedure report and other information in the discharge instructions given to you for any specific questions about what was found during the examination. If this information does not answer your questions, please call [facility] office at [phone] to clarify.  ? ?YOU SHOULD EXPECT: Some feelings of bloating in the abdomen. Passage of more gas than usual. Walking can help get rid of the air that was put into your GI tract during the procedure and reduce the bloating. If you had a lower endoscopy (such as a colonoscopy or flexible sigmoidoscopy) you may notice spotting of blood in your stool or on the toilet paper. Some abdominal soreness may be present for a day or two, also. ? ?DIET: Your first meal following the procedure should be a light meal and then it is ok to progress to your normal diet. A half-sandwich or bowl of soup is an example of a good first meal. Heavy or fried foods are harder to digest and may make you feel nauseous or bloated. Drink plenty of fluids but you should avoid alcoholic beverages for 24 hours. If you had a esophageal dilation, please see attached instructions for diet.   ? ?ACTIVITY: Your care partner should take you home directly after the procedure. You should plan to take it easy, moving slowly for the rest of the day. You can resume normal activity the day after the procedure however YOU SHOULD NOT DRIVE, use power tools, machinery or perform tasks that involve climbing or major physical exertion for 24 hours (because of the sedation medicines used during the test).  ? ?SYMPTOMS TO REPORT IMMEDIATELY: ?A gastroenterologist can be reached at any hour. Please call [phone]  for any of the following symptoms:  ?Following lower endoscopy (colonoscopy, flexible sigmoidoscopy) ?Excessive amounts of blood in the stool  ?Significant tenderness, worsening of abdominal pains  ?Swelling of the abdomen that is new, acute  ?Fever of 100?  or higher  ?Following upper endoscopy (EGD, EUS, ERCP, esophageal dilation) ?Vomiting of blood or coffee ground material  ?New, significant abdominal pain  ?New, significant chest pain or pain under the shoulder blades  ?Painful or persistently difficult swallowing  ?New shortness of breath  ?Black, tarry-looking or red, bloody stools ? ?FOLLOW UP: Call if GI question or problem otherwise we will repeat colonoscopy based on biopsies in 3 to 5 years and follow-up as needed in the meantime ?If any biopsies were taken you will be contacted by phone or by letter within the next 1-3 weeks. Call [phone]  if you have not heard about the biopsies in 3 weeks.  ?Please also call with any specific questions about appointments or follow up tests. YOU HAD AN ENDOSCOPIC PROCEDURE TODAY: Refer to the procedure report and other information in the discharge instructions given to you for any specific questions about what was found during the examination. If this information does not answer your questions, please call [facility] office at [phone] to clarify.  ? ?YOU SHOULD EXPECT: Some feelings of bloating in the abdomen. Passage of more gas than usual. Walking can help get rid of the air that was put into your GI tract during the procedure and reduce the bloating. If you had a lower endoscopy (such as a colonoscopy or flexible sigmoidoscopy) you may notice spotting of blood in your stool or on the toilet paper. Some abdominal soreness may be present for a day or two, also. ? ?DIET: Your first meal following the  procedure should be a light meal and then it is ok to progress to your normal diet. A half-sandwich or bowl of soup is an example of a good first meal. Heavy or fried foods are harder to digest and may make you feel nauseous or bloated. Drink plenty of fluids but you should avoid alcoholic beverages for 24 hours. If you had a esophageal dilation, please see attached instructions for diet.   ? ?ACTIVITY: Your care  partner should take you home directly after the procedure. You should plan to take it easy, moving slowly for the rest of the day. You can resume normal activity the day after the procedure however YOU SHOULD NOT DRIVE, use power tools, machinery or perform tasks that involve climbing or major physical exertion for 24 hours (because of the sedation medicines used during the test).  ? ?SYMPTOMS TO REPORT IMMEDIATELY: ?A gastroenterologist can be reached at any hour. Please call [phone]  for any of the following symptoms:  ?Following lower endoscopy (colonoscopy, flexible sigmoidoscopy) ?Excessive amounts of blood in the stool  ?Significant tenderness, worsening of abdominal pains  ?Swelling of the abdomen that is new, acute  ?Fever of 100? or higher  ?Following upper endoscopy (EGD, EUS, ERCP, esophageal dilation) ?Vomiting of blood or coffee ground material  ?New, significant abdominal pain  ?New, significant chest pain or pain under the shoulder blades  ?Painful or persistently difficult swallowing  ?New shortness of breath  ?Black, tarry-looking or red, bloody stools ? ?FOLLOW UP:  ?If any biopsies were taken you will be contacted by phone or by letter within the next 1-3 weeks. Call [phone]  if you have not heard about the biopsies in 3 weeks.  ?Please also call with any specific questions about appointments or follow up tests.  ?

## 2021-10-02 NOTE — Anesthesia Procedure Notes (Signed)
Procedure Name: [place] ?Date/Time: 10/02/2021 12:08 PM ?Performed by: Deliah Boston, CRNA ?Pre-anesthesia Checklist: Patient identified, Emergency Drugs available, Suction available and Patient being monitored ?Patient Re-evaluated:Patient Re-evaluated prior to induction ?Oxygen Delivery Method: Simple face mask ?Preoxygenation: Pre-oxygenation with 100% oxygen ?Placement Confirmation: positive ETCO2 and breath sounds checked- equal and bilateral ? ? ? ? ?

## 2021-10-02 NOTE — Transfer of Care (Signed)
Immediate Anesthesia Transfer of Care Note ? ?Patient: [name] ? ?Procedure(s) Performed: Procedure(s): ?COLONOSCOPY WITH PROPOFOL (N/A) ?POLYPECTOMY ? ?Patient Location: PACU ? ?Anesthesia Type:MAC ? ?Level of Consciousness: Patient easily awoken, sedated, comfortable, cooperative, following commands, responds to stimulation.  ? ?Airway & Oxygen Therapy: Patient spontaneously breathing, ventilating well, oxygen via simple oxygen mask. ? ?Post-op Assessment: Report given to PACU RN, vital signs reviewed and stable, moving all extremities.  ? ?Post vital signs: Reviewed and stable. ? ?Complications: No apparent anesthesia complications ?Last Vitals:  ?Vitals Value Taken Time  ?BP 90/39 10/02/21 1246  ?Temp 35.8 ?C 10/02/21 1246  ?Pulse 60 10/02/21 1246  ?Resp 14 10/02/21 1246  ?SpO2 99 % 10/02/21 1246  ? ? ?Last Pain:  ?Vitals:  ? 10/02/21 1246  ?TempSrc: Temporal  ?PainSc: 0-No pain  ?   ? ?  ? ?Complications: No notable events documented. ?

## 2021-10-02 NOTE — Anesthesia Preprocedure Evaluation (Signed)
Anesthesia Evaluation  ?Patient identified by MRN, date of birth, ID band ?Patient awake ? ? ? ?Reviewed: ?Allergy & Precautions, NPO status , Patient's Chart, lab work & pertinent test results, reviewed documented beta blocker date and time  ? ?Airway ?Mallampati: II ? ?TM Distance: >3 FB ?Neck ROM: Full ? ? ? Dental ? ?(+) Teeth Intact, Dental Advisory Given ?  ?Pulmonary ?asthma , COPD,  COPD inhaler, former smoker,  ?  ? ?+ decreased breath sounds ? ? ? ? ? Cardiovascular ?hypertension, Pt. on medications and Pt. on home beta blockers ?+ CAD, + Past MI, + Peripheral Vascular Disease, +CHF and + Orthopnea  ?+ dysrhythmias Atrial Fibrillation + pacemaker  ?Rhythm:Regular Rate:Normal ? ? ?  ?Neuro/Psych ? Headaches, PSYCHIATRIC DISORDERS Anxiety   ? GI/Hepatic ?Neg liver ROS, GERD  Medicated,  ?Endo/Other  ?diabetes ? Renal/GU ?Renal disease  ? ?  ?Musculoskeletal ? ?(+) Arthritis ,  ? Abdominal ?Normal abdominal exam  (+)   ?Peds ? Hematology ?  ?Anesthesia Other Findings ? ? Reproductive/Obstetrics ? ?  ? ? ? ? ? ? ? ? ? ? ? ? ? ?  ?  ? ? ? ? ? ? ? ? ?Anesthesia Physical ?Anesthesia Plan ? ?ASA: 3 ? ?Anesthesia Plan: MAC  ? ?Post-op Pain Management:   ? ?Induction: Intravenous ? ?PONV Risk Score and Plan: 0 and Propofol infusion ? ?Airway Management Planned: Natural Airway and Simple Face Mask ? ?Additional Equipment: None ? ?Intra-op Plan:  ? ?Post-operative Plan:  ? ?Informed Consent: I have reviewed the patients History and Physical, chart, labs and discussed the procedure including the risks, benefits and alternatives for the proposed anesthesia with the patient or authorized representative who has indicated his/her understanding and acceptance.  ? ? ? ? ? ?Plan Discussed with: CRNA ? ?Anesthesia Plan Comments:   ? ? ? ? ? ? ?Anesthesia Quick Evaluation ? ?

## 2021-10-03 ENCOUNTER — Encounter (HOSPITAL_COMMUNITY): Payer: Self-pay | Admitting: Gastroenterology

## 2021-10-03 LAB — SURGICAL PATHOLOGY

## 2021-10-10 ENCOUNTER — Encounter (HOSPITAL_COMMUNITY): Payer: Self-pay

## 2021-10-10 ENCOUNTER — Other Ambulatory Visit: Payer: Self-pay

## 2021-10-10 ENCOUNTER — Other Ambulatory Visit (HOSPITAL_COMMUNITY): Payer: Self-pay | Admitting: *Deleted

## 2021-10-10 ENCOUNTER — Emergency Department (HOSPITAL_COMMUNITY): Payer: Medicare Other

## 2021-10-10 ENCOUNTER — Observation Stay (HOSPITAL_COMMUNITY)
Admission: EM | Admit: 2021-10-10 | Discharge: 2021-10-11 | Disposition: A | Discharge status: Home / Self Care | Payer: Medicare Other | Attending: Family Medicine | Admitting: Family Medicine

## 2021-10-10 ENCOUNTER — Other Ambulatory Visit (HOSPITAL_COMMUNITY): Payer: Medicare Other

## 2021-10-10 ENCOUNTER — Ambulatory Visit (HOSPITAL_COMMUNITY)
Admission: EM | Disposition: A | Discharge status: Home / Self Care | Payer: Self-pay | Source: Home / Self Care | Attending: Emergency Medicine

## 2021-10-10 DIAGNOSIS — I5032 Chronic diastolic (congestive) heart failure: Secondary | ICD-10-CM | POA: Diagnosis present

## 2021-10-10 DIAGNOSIS — Z79899 Other long term (current) drug therapy: Secondary | ICD-10-CM | POA: Insufficient documentation

## 2021-10-10 DIAGNOSIS — I252 Old myocardial infarction: Secondary | ICD-10-CM | POA: Insufficient documentation

## 2021-10-10 DIAGNOSIS — I2510 Atherosclerotic heart disease of native coronary artery without angina pectoris: Secondary | ICD-10-CM | POA: Diagnosis present

## 2021-10-10 DIAGNOSIS — I132 Hypertensive heart and chronic kidney disease with heart failure and with stage 5 chronic kidney disease, or end stage renal disease: Secondary | ICD-10-CM | POA: Insufficient documentation

## 2021-10-10 DIAGNOSIS — Z7901 Long term (current) use of anticoagulants: Secondary | ICD-10-CM | POA: Diagnosis not present

## 2021-10-10 DIAGNOSIS — E669 Obesity, unspecified: Secondary | ICD-10-CM | POA: Diagnosis present

## 2021-10-10 DIAGNOSIS — I480 Paroxysmal atrial fibrillation: Secondary | ICD-10-CM | POA: Insufficient documentation

## 2021-10-10 DIAGNOSIS — N186 End stage renal disease: Secondary | ICD-10-CM | POA: Diagnosis not present

## 2021-10-10 DIAGNOSIS — Z955 Presence of coronary angioplasty implant and graft: Secondary | ICD-10-CM | POA: Diagnosis not present

## 2021-10-10 DIAGNOSIS — I2584 Coronary atherosclerosis due to calcified coronary lesion: Secondary | ICD-10-CM | POA: Insufficient documentation

## 2021-10-10 DIAGNOSIS — Z7982 Long term (current) use of aspirin: Secondary | ICD-10-CM | POA: Insufficient documentation

## 2021-10-10 DIAGNOSIS — F32A Depression, unspecified: Secondary | ICD-10-CM

## 2021-10-10 DIAGNOSIS — J449 Chronic obstructive pulmonary disease, unspecified: Secondary | ICD-10-CM | POA: Diagnosis not present

## 2021-10-10 DIAGNOSIS — Z87891 Personal history of nicotine dependence: Secondary | ICD-10-CM | POA: Diagnosis not present

## 2021-10-10 DIAGNOSIS — E1122 Type 2 diabetes mellitus with diabetic chronic kidney disease: Secondary | ICD-10-CM | POA: Insufficient documentation

## 2021-10-10 DIAGNOSIS — I214 Non-ST elevation (NSTEMI) myocardial infarction: Secondary | ICD-10-CM | POA: Diagnosis not present

## 2021-10-10 DIAGNOSIS — G2581 Restless legs syndrome: Secondary | ICD-10-CM | POA: Diagnosis present

## 2021-10-10 DIAGNOSIS — R0789 Other chest pain: Secondary | ICD-10-CM | POA: Diagnosis not present

## 2021-10-10 DIAGNOSIS — E782 Mixed hyperlipidemia: Secondary | ICD-10-CM

## 2021-10-10 DIAGNOSIS — Z6830 Body mass index (BMI) 30.0-30.9, adult: Secondary | ICD-10-CM | POA: Insufficient documentation

## 2021-10-10 DIAGNOSIS — Z992 Dependence on renal dialysis: Secondary | ICD-10-CM | POA: Diagnosis not present

## 2021-10-10 DIAGNOSIS — R079 Chest pain, unspecified: Secondary | ICD-10-CM | POA: Diagnosis present

## 2021-10-10 DIAGNOSIS — Z950 Presence of cardiac pacemaker: Secondary | ICD-10-CM | POA: Diagnosis not present

## 2021-10-10 DIAGNOSIS — I10 Essential (primary) hypertension: Secondary | ICD-10-CM | POA: Diagnosis not present

## 2021-10-10 HISTORY — PX: LEFT HEART CATH AND CORONARY ANGIOGRAPHY: CATH118249

## 2021-10-10 LAB — CBC
HCT: 38.2 % — ABNORMAL LOW (ref 39.0–52.0)
Hemoglobin: 13 g/dL (ref 13.0–17.0)
MCH: 35.5 pg — ABNORMAL HIGH (ref 26.0–34.0)
MCHC: 34 g/dL (ref 30.0–36.0)
MCV: 104.4 fL — ABNORMAL HIGH (ref 80.0–100.0)
Platelets: 162 10*3/uL (ref 150–400)
RBC: 3.66 MIL/uL — ABNORMAL LOW (ref 4.22–5.81)
RDW: 13.8 % (ref 11.5–15.5)
WBC: 13.4 10*3/uL — ABNORMAL HIGH (ref 4.0–10.5)
nRBC: 0 % (ref 0.0–0.2)

## 2021-10-10 LAB — GLUCOSE, CAPILLARY
Glucose-Capillary: 252 mg/dL — ABNORMAL HIGH (ref 70–99)
Glucose-Capillary: 285 mg/dL — ABNORMAL HIGH (ref 70–99)

## 2021-10-10 LAB — TROPONIN I (HIGH SENSITIVITY)
Troponin I (High Sensitivity): 55 ng/L — ABNORMAL HIGH (ref <18)
Troponin I (High Sensitivity): 201 ng/L (ref <18)
Troponin I (High Sensitivity): 545 ng/L (ref <18)

## 2021-10-10 LAB — BASIC METABOLIC PANEL
Anion gap: 8 (ref 5–15)
BUN: 56 mg/dL — ABNORMAL HIGH (ref 8–23)
CO2: 26 mmol/L (ref 22–32)
Calcium: 9 mg/dL (ref 8.9–10.3)
Chloride: 99 mmol/L (ref 98–111)
Creatinine, Ser: 5.51 mg/dL — ABNORMAL HIGH (ref 0.61–1.24)
GFR, Estimated: 11 mL/min — ABNORMAL LOW (ref >60)
Glucose, Bld: 280 mg/dL — ABNORMAL HIGH (ref 70–99)
Potassium: 3.9 mmol/L (ref 3.5–5.1)
Sodium: 133 mmol/L — ABNORMAL LOW (ref 135–145)

## 2021-10-10 LAB — HIV ANTIBODY (ROUTINE TESTING W REFLEX): HIV Screen 4th Generation wRfx: NONREACTIVE

## 2021-10-10 LAB — FOLATE: Folate: 13.3 ng/mL (ref >5.9)

## 2021-10-10 LAB — HEMOGLOBIN A1C
Hgb A1c MFr Bld: 5.7 % — ABNORMAL HIGH (ref 4.8–5.6)
Mean Plasma Glucose: 116.89 mg/dL

## 2021-10-10 LAB — POCT ACTIVATED CLOTTING TIME
Activated Clotting Time: 486 seconds
Activated Clotting Time: 786 seconds
Activated Clotting Time: 420 seconds

## 2021-10-10 LAB — CBG MONITORING, ED: Glucose-Capillary: 168 mg/dL — ABNORMAL HIGH (ref 70–99)

## 2021-10-10 LAB — PHOSPHORUS: Phosphorus: 2.6 mg/dL (ref 2.5–4.6)

## 2021-10-10 LAB — MAGNESIUM: Magnesium: 2 mg/dL (ref 1.7–2.4)

## 2021-10-10 LAB — VITAMIN B12: Vitamin B-12: 761 pg/mL (ref 180–914)

## 2021-10-10 SURGERY — LEFT HEART CATH AND CORONARY ANGIOGRAPHY
Anesthesia: LOCAL

## 2021-10-10 MED ORDER — SODIUM CHLORIDE 0.9% FLUSH: 3.0000 mL | INTRAVENOUS | Status: DC | PRN

## 2021-10-10 MED ORDER — HEPARIN SODIUM (PORCINE) 1000 UNIT/ML IJ SOLN
INTRAMUSCULAR | Status: AC
  Filled 2021-10-10: qty 10

## 2021-10-10 MED ORDER — HEPARIN (PORCINE) 25000 UT/250ML-% IV SOLN
1350.0000 [IU]/h | INTRAVENOUS | Status: DC
  Administered 2021-10-10: 1350 [IU]/h via INTRAVENOUS
  Filled 2021-10-10: qty 250

## 2021-10-10 MED ORDER — NITROGLYCERIN IN D5W 200-5 MCG/ML-% IV SOLN
0.0000 ug/min | INTRAVENOUS | Status: DC
  Administered 2021-10-10: 25 ug/min via INTRAVENOUS
  Administered 2021-10-10: 5 ug/min via INTRAVENOUS
  Filled 2021-10-10: qty 250

## 2021-10-10 MED ORDER — BUDESONIDE 0.5 MG/2ML IN SUSP
0.5000 mg | Freq: Two times a day (BID) | RESPIRATORY_TRACT | Status: DC
  Administered 2021-10-10 – 2021-10-11 (×3): 0.5 mg via RESPIRATORY_TRACT
  Filled 2021-10-10 (×3): qty 2

## 2021-10-10 MED ORDER — FENTANYL CITRATE (PF) 100 MCG/2ML IJ SOLN
INTRAMUSCULAR | Status: DC | PRN
  Administered 2021-10-10 (×3): 25 ug via INTRAVENOUS

## 2021-10-10 MED ORDER — SODIUM CHLORIDE 0.9 % IV SOLN: INTRAVENOUS | Status: DC

## 2021-10-10 MED ORDER — VERAPAMIL HCL 2.5 MG/ML IV SOLN
INTRAVENOUS | Status: DC | PRN
  Administered 2021-10-10: 10 mL via INTRA_ARTERIAL

## 2021-10-10 MED ORDER — HEPARIN BOLUS VIA INFUSION
4000.0000 [IU] | Freq: Once | INTRAVENOUS | Status: AC
  Administered 2021-10-10: 4000 [IU] via INTRAVENOUS

## 2021-10-10 MED ORDER — MIDAZOLAM HCL 2 MG/2ML IJ SOLN
INTRAMUSCULAR | Status: AC
  Filled 2021-10-10: qty 2

## 2021-10-10 MED ORDER — ASPIRIN 81 MG PO TBEC
81.0000 mg | DELAYED_RELEASE_TABLET | Freq: Every day | ORAL | Status: DC
  Administered 2021-10-11: 81 mg via ORAL
  Filled 2021-10-10: qty 1

## 2021-10-10 MED ORDER — IOHEXOL 350 MG/ML SOLN
INTRAVENOUS | Status: DC | PRN
  Administered 2021-10-10: 115 mL

## 2021-10-10 MED ORDER — FENTANYL CITRATE (PF) 100 MCG/2ML IJ SOLN
INTRAMUSCULAR | Status: AC
  Filled 2021-10-10: qty 2

## 2021-10-10 MED ORDER — HEPARIN SODIUM (PORCINE) 1000 UNIT/ML IJ SOLN
INTRAMUSCULAR | Status: DC | PRN
Start: 2021-10-10 — End: 2021-10-10
  Administered 2021-10-10: 9000 [IU] via INTRAVENOUS
  Administered 2021-10-10: 5000 [IU] via INTRAVENOUS

## 2021-10-10 MED ORDER — NITROGLYCERIN 2 % TD OINT
0.5000 [in_us] | TOPICAL_OINTMENT | Freq: Once | TRANSDERMAL | Status: AC
  Administered 2021-10-10: 0.5 [in_us] via TOPICAL
  Filled 2021-10-10: qty 1

## 2021-10-10 MED ORDER — CLOPIDOGREL BISULFATE 300 MG PO TABS
ORAL_TABLET | ORAL | Status: DC | PRN
Start: 2021-10-10 — End: 2021-10-10
  Administered 2021-10-10: 600 mg via ORAL

## 2021-10-10 MED ORDER — NITROGLYCERIN 1 MG/10 ML FOR IR/CATH LAB
INTRA_ARTERIAL | Status: AC
  Filled 2021-10-10: qty 10

## 2021-10-10 MED ORDER — LABETALOL HCL 5 MG/ML IV SOLN: 10.0000 mg | INTRAVENOUS | Status: AC | PRN

## 2021-10-10 MED ORDER — VERAPAMIL HCL 2.5 MG/ML IV SOLN
INTRAVENOUS | Status: AC
  Filled 2021-10-10: qty 2

## 2021-10-10 MED ORDER — SODIUM CHLORIDE 0.9% FLUSH
3.0000 mL | Freq: Two times a day (BID) | INTRAVENOUS | Status: DC
  Administered 2021-10-10: 3 mL via INTRAVENOUS

## 2021-10-10 MED ORDER — SODIUM CHLORIDE 0.9 % IV SOLN: 250.0000 mL | INTRAVENOUS | Status: DC | PRN

## 2021-10-10 MED ORDER — LIDOCAINE HCL (PF) 1 % IJ SOLN
INTRAMUSCULAR | Status: DC | PRN
  Administered 2021-10-10: 2 mL

## 2021-10-10 MED ORDER — PANTOPRAZOLE SODIUM 40 MG PO TBEC
40.0000 mg | DELAYED_RELEASE_TABLET | Freq: Every day | ORAL | Status: DC
  Administered 2021-10-10 – 2021-10-11 (×2): 40 mg via ORAL
  Filled 2021-10-10 (×2): qty 1

## 2021-10-10 MED ORDER — BECLOMETHASONE DIPROPIONATE 80 MCG/ACT IN AERS: 2.0000 | INHALATION_SPRAY | Freq: Two times a day (BID) | RESPIRATORY_TRACT | Status: DC

## 2021-10-10 MED ORDER — AMLODIPINE BESYLATE 5 MG PO TABS
5.0000 mg | ORAL_TABLET | Freq: Every day | ORAL | Status: DC
  Administered 2021-10-10 – 2021-10-11 (×2): 5 mg via ORAL
  Filled 2021-10-10 (×2): qty 1

## 2021-10-10 MED ORDER — ALBUTEROL SULFATE HFA 108 (90 BASE) MCG/ACT IN AERS: 2.0000 | INHALATION_SPRAY | Freq: Four times a day (QID) | RESPIRATORY_TRACT | Status: DC | PRN

## 2021-10-10 MED ORDER — CLOPIDOGREL BISULFATE 300 MG PO TABS
ORAL_TABLET | ORAL | Status: AC
  Filled 2021-10-10: qty 2

## 2021-10-10 MED ORDER — NITROGLYCERIN 0.4 MG SL SUBL: 0.4000 mg | SUBLINGUAL_TABLET | SUBLINGUAL | Status: DC | PRN

## 2021-10-10 MED ORDER — HEPARIN (PORCINE) IN NACL 1000-0.9 UT/500ML-% IV SOLN
INTRAVENOUS | Status: DC | PRN
Start: 2021-10-10 — End: 2021-10-10
  Administered 2021-10-10 (×2): 500 mL

## 2021-10-10 MED ORDER — ACETAMINOPHEN 325 MG PO TABS: 650.0000 mg | ORAL_TABLET | ORAL | Status: DC | PRN

## 2021-10-10 MED ORDER — CHLORHEXIDINE GLUCONATE CLOTH 2 % EX PADS
6.0000 | MEDICATED_PAD | Freq: Every day | CUTANEOUS | Status: DC
  Administered 2021-10-11: 6 via TOPICAL

## 2021-10-10 MED ORDER — ATORVASTATIN CALCIUM 40 MG PO TABS
40.0000 mg | ORAL_TABLET | Freq: Every day | ORAL | Status: DC
  Administered 2021-10-10 – 2021-10-11 (×2): 40 mg via ORAL
  Filled 2021-10-10 (×2): qty 1

## 2021-10-10 MED ORDER — CARVEDILOL 25 MG PO TABS
25.0000 mg | ORAL_TABLET | Freq: Two times a day (BID) | ORAL | Status: DC
  Administered 2021-10-10 – 2021-10-11 (×3): 25 mg via ORAL
  Filled 2021-10-10 (×2): qty 1
  Filled 2021-10-10: qty 2

## 2021-10-10 MED ORDER — ASPIRIN 81 MG PO CHEW
81.0000 mg | CHEWABLE_TABLET | ORAL | Status: AC
  Administered 2021-10-10: 81 mg via ORAL
  Filled 2021-10-10: qty 1

## 2021-10-10 MED ORDER — CLOPIDOGREL BISULFATE 75 MG PO TABS
75.0000 mg | ORAL_TABLET | Freq: Every day | ORAL | Status: DC
  Administered 2021-10-11: 75 mg via ORAL
  Filled 2021-10-10: qty 1

## 2021-10-10 MED ORDER — ROPINIROLE HCL 0.5 MG PO TABS
1.0000 mg | ORAL_TABLET | Freq: Every day | ORAL | Status: DC
  Administered 2021-10-10 – 2021-10-11 (×2): 1 mg via ORAL
  Filled 2021-10-10: qty 1
  Filled 2021-10-10: qty 2

## 2021-10-10 MED ORDER — LIDOCAINE HCL (PF) 1 % IJ SOLN
INTRAMUSCULAR | Status: AC
Start: 2021-10-10 — End: ?
  Filled 2021-10-10: qty 30

## 2021-10-10 MED ORDER — MIDAZOLAM HCL 2 MG/2ML IJ SOLN
INTRAMUSCULAR | Status: DC | PRN
  Administered 2021-10-10 (×3): 1 mg via INTRAVENOUS

## 2021-10-10 MED ORDER — SERTRALINE HCL 100 MG PO TABS
100.0000 mg | ORAL_TABLET | Freq: Every morning | ORAL | Status: DC
  Administered 2021-10-10 – 2021-10-11 (×2): 100 mg via ORAL
  Filled 2021-10-10: qty 2
  Filled 2021-10-10: qty 1

## 2021-10-10 MED ORDER — ASPIRIN 81 MG PO CHEW: 81.0000 mg | CHEWABLE_TABLET | ORAL | Status: DC

## 2021-10-10 MED ORDER — GENTAMICIN SULFATE 0.1 % EX CREA
1.0000 "application " | TOPICAL_CREAM | Freq: Every day | CUTANEOUS | Status: DC
  Administered 2021-10-10: 1 via TOPICAL
  Filled 2021-10-10 (×2): qty 15

## 2021-10-10 MED ORDER — DELFLEX-LC/2.5% DEXTROSE 394 MOSM/L IP SOLN
INTRAPERITONEAL | Status: DC
  Administered 2021-10-10: 5000 mL via INTRAPERITONEAL

## 2021-10-10 MED ORDER — ONDANSETRON HCL 4 MG/2ML IJ SOLN: 4.0000 mg | Freq: Four times a day (QID) | INTRAMUSCULAR | Status: DC | PRN

## 2021-10-10 MED ORDER — HYDRALAZINE HCL 20 MG/ML IJ SOLN: 10.0000 mg | INTRAMUSCULAR | Status: AC | PRN

## 2021-10-10 MED ORDER — DELFLEX-LC/1.5% DEXTROSE 344 MOSM/L IP SOLN
Freq: Every day | INTRAPERITONEAL | Status: DC
  Administered 2021-10-10: 5000 mL via INTRAPERITONEAL

## 2021-10-10 MED ORDER — HEPARIN (PORCINE) IN NACL 1000-0.9 UT/500ML-% IV SOLN
INTRAVENOUS | Status: AC
Start: 2021-10-10 — End: ?
  Filled 2021-10-10: qty 1000

## 2021-10-10 SURGICAL SUPPLY — 22 items
BALLN MINITREK OTW 2.0X12 (BALLOONS) ×2
BALLOON MINITREK OTW 2.0X12 (BALLOONS) IMPLANT
BAND CMPR LRG ZPHR (HEMOSTASIS) ×1
BAND ZEPHYR COMPRESS 30 LONG (HEMOSTASIS) ×1 IMPLANT
CATH INFINITI 6F ANG MULTIPACK (CATHETERS) ×1 IMPLANT
CATH INFINITI 6F FL3.5 (CATHETERS) ×1 IMPLANT
CATH LAUNCHER 6FR EBU3.5 (CATHETERS) ×1 IMPLANT
CATH SUPERCROSS ANGLED 120 DEG (MICROCATHETER) ×1 IMPLANT
CATH SUPERCROSS ANGLED 90 DEG (MICROCATHETER) ×1 IMPLANT
ELECT DEFIB PAD ADLT CADENCE (PAD) ×1 IMPLANT
GLIDESHEATH SLEND SS 6F .021 (SHEATH) ×1 IMPLANT
GUIDEWIRE INQWIRE 1.5J.035X260 (WIRE) IMPLANT
GUIDEWIRE VAS SION BLUE 190 (WIRE) ×1 IMPLANT
INQWIRE 1.5J .035X260CM (WIRE) ×2
KIT ENCORE 26 ADVANTAGE (KITS) ×1 IMPLANT
KIT HEART LEFT (KITS) ×2 IMPLANT
KIT HEMO VALVE WATCHDOG (MISCELLANEOUS) ×1 IMPLANT
PACK CARDIAC CATHETERIZATION (CUSTOM PROCEDURE TRAY) ×3 IMPLANT
TRANSDUCER W/STOPCOCK (MISCELLANEOUS) ×2 IMPLANT
TUBING CIL FLEX 10 FLL-RA (TUBING) ×3 IMPLANT
WIRE ASAHI PROWATER 300CM (WIRE) ×1 IMPLANT
WIRE CHOICE GRAPHX PT 300 (WIRE) ×1 IMPLANT

## 2021-10-10 NOTE — ED Provider Notes (Addendum)
[facility] Provider Note   CSN: [id number] Arrival date & time: 10/10/21  0100     History  Chief Complaint  Patient presents with   Chest Pain    [name] is a 71 y.o. male.  HPI     This is a 71 year old male with history of end-stage renal disease on peritoneal dialysis who presents with chest pain.  Patient reports pain that originally started in his abdomen and is described as tightness.  He states that it migrated upward and now is mostly in his chest.  He thought it was related to too much fluid.  His wife drained his dialysate but he had continued pain.  It is improved when he sits up.  It is otherwise constant.  It is pressure-like.  He has no radiation of the pain.  Some improvement with nitroglycerin at home.  No recent shortness of breath, fevers, cough.  Chart review reveals stress test on 10/19/2020 which was high risk.  No catheterizations in chart noted.  Home Medications Prior to Admission medications   Medication Sig Start Date End Date Taking? Authorizing Provider  albuterol (VENTOLIN HFA) 108 (90 Base) MCG/ACT inhaler Inhale 2 puffs into the lungs every 6 (six) hours as needed for wheezing or shortness of breath.     [provider]  AMBULATORY NON FORMULARY MEDICATION Medication Name: Trimix  PGE 30 mcg Pap 30 mg Phent 1 mg  0.3 mls by intracavernosal route as needed 07/03/21   Cleon Gustin, MD  amLODipine (NORVASC) 5 MG tablet Take 5 mg by mouth daily. 08/06/21   [provider]  aspirin EC 81 MG EC tablet Take 1 tablet (81 mg total) by mouth daily. 07/25/19   Isaac Bliss, Rayford Halsted, MD  atorvastatin (LIPITOR) 40 MG tablet Take 1 tablet by mouth once daily 03/16/21   Arnoldo Lenis, MD  beclomethasone (QVAR) 80 MCG/ACT inhaler Inhale 2 puffs into the lungs in the morning and at bedtime.    [provider]  CALCIUM CITRATE PO Take 600 mg by mouth in the morning and at bedtime.    [provider]  carvedilol (COREG) 25 MG tablet Take 1 tablet (25 mg total) by mouth 2 (two) times daily with a meal. 09/13/21   Branch, Alphonse Guild, MD  Cholecalciferol (VITAMIN D3) 2000 units TABS Take 2,000 Units by mouth daily.     [provider]  clobetasol (TEMOVATE) 0.05 % external solution Apply 1 application topically 2 (two) times daily as needed (itching).    [provider]  denosumab (PROLIA) 60 MG/ML SOSY injection Inject 60 mg into the skin every 6 (six) months.    [provider]  Ergocalciferol (CALCIFEROL PO) Take 0.25 mg by mouth 3 (three) times a week.    [provider]  fluticasone (CUTIVATE) 0.05 % cream Apply topically.    [provider]  linaclotide (LINZESS) 290 MCG CAPS capsule 1 cap(s) orally once a day for 30 day(s)    [provider]  magnesium oxide (MAG-OX) 400 MG tablet Take 400 mg by mouth daily.    [provider]  Melatonin 10 MG TABS Take 20 mg by mouth at bedtime.    [provider]  multivitamin (RENA-VIT) TABS tablet Take 1 tablet by mouth daily.    [provider]  nitroGLYCERIN (NITROSTAT) 0.4 MG SL tablet Place 0.4 mg under the tongue every 5 (five) minutes as needed for chest pain.  [provider]  omeprazole (PRILOSEC) 20 MG capsule Take 20 mg by mouth daily.    [provider]  Potassium Chloride ER 20 MEQ TBCR Take 20 mEq by mouth daily. 09/28/20   [provider]  risperiDONE (RISPERDAL) 0.5 MG tablet Take 0.5 mg by mouth at bedtime.    [provider]  rOPINIRole (REQUIP) 1 MG tablet Take 1 mg by mouth daily. 03/02/21   [provider]  senna-docusate (SENOKOT-S) 8.6-50 MG tablet Take 2 tablets by mouth at bedtime.    [provider]  sertraline (ZOLOFT) 100 MG tablet Take 100 mg by mouth in the morning. 09/22/20   [provider]  traMADol (ULTRAM) 50 MG tablet Take 50 mg by mouth every 4 (four) hours as  needed. 09/13/21   [provider]      Allergies    Hydrocodone, Other, and Oxycodone-acetaminophen    Review of Systems   Review of Systems  Constitutional:  Negative for fever.  Respiratory:  Negative for shortness of breath.   Cardiovascular:  Positive for chest pain.  Gastrointestinal:  Negative for abdominal distention and abdominal pain.  All other systems reviewed and are negative.  Physical Exam Updated Vital Signs BP (!) 149/71   Pulse 70   Temp 98.1 F (36.7 C) (Oral)   Resp 19   Ht 1.93 m ('6\' 4"'$ )   Wt 113.4 kg   SpO2 99%   BMI 30.43 kg/m  Physical Exam Vitals and nursing note reviewed.  Constitutional:      Appearance: He is well-developed.  HENT:     Head: Normocephalic and atraumatic.  Eyes:     Pupils: Pupils are equal, round, and reactive to light.  Cardiovascular:     Rate and Rhythm: Normal rate and regular rhythm.     Heart sounds: Murmur heard.  Pulmonary:     Effort: Pulmonary effort is normal. No respiratory distress.     Breath sounds: Normal breath sounds. No wheezing.  Abdominal:     General: Bowel sounds are normal.     Palpations: Abdomen is soft.     Tenderness: There is no abdominal tenderness. There is no rebound.     Comments: Dialysis catheter in place, no adjacent erythema, no significant distention or tenderness to palpation  Musculoskeletal:     Cervical back: Neck supple.  Lymphadenopathy:     Cervical: No cervical adenopathy.  Skin:    General: Skin is warm and dry.  Neurological:     Mental Status: He is alert and oriented to person, place, and time.  Psychiatric:        Mood and Affect: Mood normal.    ED Results / Procedures / Treatments   Labs (all labs ordered are listed, but only abnormal results are displayed) Labs Reviewed  BASIC METABOLIC PANEL - Abnormal; Notable for the following components:      Result Value   Sodium 133 (*)    Glucose, Bld 280 (*)    BUN 56 (*)    Creatinine, Ser 5.51 (*)     GFR, Estimated 11 (*)    All other components within normal limits  CBC - Abnormal; Notable for the following components:   WBC 13.4 (*)    RBC 3.66 (*)    HCT 38.2 (*)    MCV 104.4 (*)    MCH 35.5 (*)    All other components within normal limits  TROPONIN I (HIGH SENSITIVITY) - Abnormal; Notable for the following components:  Troponin I (High Sensitivity) 55 (*)    All other components within normal limits  TROPONIN I (HIGH SENSITIVITY) - Abnormal; Notable for the following components:   Troponin I (High Sensitivity) 201 (*)    All other components within normal limits  HEPARIN LEVEL (UNFRACTIONATED)    EKG EKG Interpretation  Date/Time:  Wednesday Oct 10 2021 01:16:39 EDT Ventricular Rate:  72 PR Interval:  249 QRS Duration: 106 QT Interval:  392 QTC Calculation: 429 R Axis:   19 Text Interpretation: Sinus or ectopic atrial rhythm Prolonged PR interval Probable anteroseptal infarct, old Confirmed by Thayer Jew [phone]) on 10/10/2021 1:59:46 AM  Radiology DG Chest 2 View  Result Date: 10/10/2021 CLINICAL DATA:  Central chest pain. EXAM: CHEST - 2 VIEW COMPARISON:  November 23, 2019 FINDINGS: There is a dual lead AICD. The heart size and mediastinal contours are within normal limits. The lungs are hyperinflated with mild, diffuse, chronic appearing increased lung markings. Mild atelectasis is seen within the right lung base. There is no evidence of a pleural effusion or pneumothorax. A spinal stimulator wire is seen with its distal tip at the approximate level of T8. The visualized skeletal structures are unremarkable. IMPRESSION: Chronic appearing increased lung markings with mild right basilar atelectasis. Electronically Signed   By: Virgina Norfolk M.D.   On: 10/10/2021 02:09    Procedures .Critical Care Performed by: Merryl Hacker, MD Authorized by: Merryl Hacker, MD   Critical care provider statement:    Critical care time (minutes):  45   Critical care was  necessary to treat or prevent imminent or life-threatening deterioration of the following conditions:  Cardiac failure   Critical care was time spent personally by me on the following activities:  Development of treatment plan with patient or surrogate, discussions with consultants, evaluation of patient's response to treatment, examination of patient, ordering and review of laboratory studies, ordering and review of radiographic studies, ordering and performing treatments and interventions, pulse oximetry, re-evaluation of patient's condition and review of old charts    Medications Ordered in ED Medications  heparin ADULT infusion 100 units/mL (25000 units/273m) (1,350 Units/hr Intravenous New Bag/Given 10/10/21 0510)  nitroGLYCERIN (NITROGLYN) 2 % ointment 0.5 inch (0.5 inches Topical Given 10/10/21 0439)  heparin bolus via infusion 4,000 Units (4,000 Units Intravenous Bolus from Bag 10/10/21 0455)    ED Course/ Medical Decision Making/ A&P Clinical Course as of 10/10/21 0559  Wed Oct 10, 2021  0559 Spoke to cardiology, Dr. YAlfred Levins  He is aware of the patient.  [facility].  Cardiology will evaluate and consult. [CH]    Clinical Course User Index [CH] Camber Ninh, CBarbette Hair MD                           Medical Decision Making Amount and/or Complexity of Data Reviewed Labs: ordered. Radiology: ordered.  Risk Prescription drug management. Decision regarding hospitalization.   This patient presents to the ED for concern of chest pain, this involves an extensive number of treatment options, and is a complaint that carries with it a high risk of complications and morbidity.  I considered the following differential and admission for this acute, potentially life threatening condition.  The differential diagnosis includes ACS, PE, pneumonia, pneumothorax  MDM:    This is a 71year old male who presents with chest pain.  Reports that it is  constant in nature.  He is  nontoxic.  Vital signs notable for blood pressure 175/86.  He has a murmur on exam but otherwise is nontoxic.  Initial EKG without acute ischemic changes.  Troponin marginally elevated in the 50s.  This is in the setting of end-stage renal disease.  We will repeat.  Nitroglycerin paste was applied.  Chest x-ray without pneumothorax or pneumonia.  Repeat troponin is 201.  Patient previously approximately 1 year ago had a high risk stress test.  He was placed on a heparin drip to cover for ACS.  We will plan for admission to the hospital and cardiology evaluation later today.  Do not feel he needs emergent cardiology evaluation as he is clinically stable.  He may ultimately need transfer but feel that this can be dictated by daytime consult.  (Labs, imaging, consults)  Labs: I Ordered, and personally interpreted labs.  The pertinent results include: Troponin up to 201.  Imaging Studies ordered: I ordered imaging studies including x-ray without evidence of pneumothorax or pneumonia I independently visualized and interpreted imaging. I agree with the radiologist interpretation  Additional history obtained from wife at bedside.  External records from outside source obtained and reviewed including cardiac history and Myoview  Cardiac Monitoring: The patient was maintained on a cardiac monitor.  I personally viewed and interpreted the cardiac monitored which showed an underlying rhythm of: Normal sinus  Reevaluation: After the interventions noted above, I reevaluated the patient and found that they have :improved  Social Determinants of Health: Lives independently, on peritoneal dialysis  Disposition: Admit  Co morbidities that complicate the patient evaluation  Past Medical History:  Diagnosis Date   AAA (abdominal aortic aneurysm) ([place]) 06/2016   Mild increase in size of 3.4 cm infrarenal abdominal aortic aneurysm   Anemia    Aortic atherosclerosis (HCC)     Arthritis    Asthma    Atrial flutter ([place])    AVF (arteriovenous fistula) ([place])    Left forearm   CAD (coronary artery disease)    a. s/p prior stenting of RCA in 1999 b. cath in 2003 showing moderate CAD c. NST in 2016 showing small area of ischemic and low-risk   CHF (congestive heart failure) ([place])    CKD (chronic kidney disease), stage V ([place])    kidney transplant evaluation   COPD (chronic obstructive pulmonary disease) ([place])    with ongoing tobacco use and patient failed sham takes   Diverticulosis 2018   Mild sigmoid colon diverticulosis.   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    Reflux esophagitis    hx   Two-vessel coronary artery disease    moderate. by cath in 2003. Status post stenting of the mdi RCA November 1999 normal left ventricular ejection fraction   Wears glasses    Wears hearing aid    B/L     Medicines Meds ordered this encounter  Medications  nitroGLYCERIN (NITROGLYN) 2 % ointment 0.5 inch   heparin bolus via infusion 4,000 Units   heparin ADULT infusion 100 units/mL (25000 units/27m)    I have reviewed the patients home medicines and have made adjustments as needed  Problem List / ED Course: Problem List Items Addressed This Visit       Cardiovascular and Mediastinum   * (Principal) NSTEMI (non-ST elevated myocardial infarction) ([facility]   Relevant Medications   heparin ADULT infusion 100 units/mL (25000 units/258m     Other    Atypical chest pain                Final Clinical Impression(s) / ED Diagnoses Final diagnoses:  NSTEMI (non-ST elevated myocardial infarction) ([facility] Atypical chest pain    Rx / DC Orders ED Discharge Orders     None         Leshonda Galambos, CoBarbette HairMD 10/10/21 [id number]  HoMerryl HackerMD 10/10/21 0600

## 2021-10-10 NOTE — Assessment & Plan Note (Signed)
--   resumed home sertraline 100 mg daily

## 2021-10-10 NOTE — Assessment & Plan Note (Signed)
--   seen by cardiology at [facility], they are recommending transfer to [facility] for cath.  They have made arrangements to transfer patient directly from ED to cath lab.  I have notified bed placement of need to place in bed at [facility] after cath.  Continue IV heparin, cardiology team started IV nitroglycerin infusion and restarted home oral medications

## 2021-10-10 NOTE — Progress Notes (Signed)
ANTICOAGULATION CONSULT NOTE - Initial Consult  Pharmacy Consult for Heparin Indication: chest pain/ACS  Allergies  Allergen Reactions   Hydrocodone Itching   Other     Opiods cause hallucinations and delusions has to be given risperidone to settle down Other reaction(s): hallucinations/delirium   Oxycodone-Acetaminophen Other (See Comments)    Patient Measurements: Height: '6\' 4"'$  (193 cm) Weight: 113.4 kg (250 lb) IBW/kg (Calculated) : 86.8 Heparin Dosing Weight: 110 kg  Vital Signs: Temp: 98.1 F (36.7 C) (05/24 0112) Temp Source: Oral (05/24 0112) BP: 175/86 (05/24 0400) Pulse Rate: 69 (05/24 0400)  Labs: Recent Labs    10/10/21 0126 10/10/21 0345  HGB 13.0  --   HCT 38.2*  --   PLT 162  --   CREATININE 5.51*  --   TROPONINIHS 55* 201*    Estimated Creatinine Clearance: 17.7 mL/min (A) (by C-G formula based on SCr of 5.51 mg/dL (H)).   Medical History: Past Medical History:  Diagnosis Date   AAA (abdominal aortic aneurysm) ([place]) 06/2016   Mild increase in size of 3.4 cm infrarenal abdominal aortic aneurysm   Anemia    Aortic atherosclerosis (HCC)    Arthritis    Asthma    Atrial flutter (HCC)    AVF (arteriovenous fistula) (HCC)    Left forearm   CAD (coronary artery disease)    a. s/p prior stenting of RCA in 1999 b. cath in 2003 showing moderate CAD c. NST in 2016 showing small area of ischemic and low-risk   CHF (congestive heart failure) ([place])    CKD (chronic kidney disease), stage V ([place])    kidney transplant evaluation   COPD (chronic obstructive pulmonary disease) ([place])    with ongoing tobacco use and patient failed sham takes   Diverticulosis 2018   Mild sigmoid colon diverticulosis.   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    Reflux esophagitis    hx   Two-vessel coronary artery disease    moderate. by cath in 2003. Status post stenting of the mdi RCA November 1999 normal left ventricular ejection fraction   Wears glasses    Wears hearing aid    B/L    Medications:  No current facility-administered medications on file prior to encounter.   Current Outpatient Medications on File Prior to Encounter  Medication Sig Dispense Refill   albuterol (VENTOLIN HFA) 108 (90 Base) MCG/ACT inhaler Inhale 2 puffs into the lungs every 6 (six) hours as needed for wheezing or shortness of breath.      AMBULATORY NON FORMULARY MEDICATION Medication Name: Trimix  PGE 30 mcg Pap 30 mg Phent 1 mg  0.3 mls by intracavernosal route as needed 5 mL 5   amLODipine (NORVASC) 5 MG tablet Take 5 mg by mouth daily.     aspirin EC 81 MG EC tablet Take 1 tablet (81 mg total) by mouth daily.  atorvastatin (LIPITOR) 40 MG tablet Take 1 tablet by mouth once daily 90 tablet 3   beclomethasone (QVAR) 80 MCG/ACT inhaler Inhale 2 puffs into the lungs in the morning and at bedtime.     CALCIUM CITRATE PO Take 600 mg by mouth in the morning and at bedtime.     carvedilol (COREG) 25 MG tablet Take 1 tablet (25 mg total) by mouth 2 (two) times daily with a meal. 180 tablet 3   Cholecalciferol (VITAMIN D3) 2000 units TABS Take 2,000 Units by mouth daily.      clobetasol (TEMOVATE) 0.05 % external solution Apply 1 application topically 2 (two) times daily as needed (itching).     denosumab (PROLIA) 60 MG/ML SOSY injection Inject 60 mg into the skin every 6 (six) months.     Ergocalciferol (CALCIFEROL PO) Take 0.25 mg by mouth 3 (three) times a week.      fluticasone (CUTIVATE) 0.05 % cream Apply topically.     linaclotide (LINZESS) 290 MCG CAPS capsule 1 cap(s) orally once a day for 30 day(s)     magnesium oxide (MAG-OX) 400 MG tablet Take 400 mg by mouth daily.     Melatonin 10 MG TABS Take 20 mg by mouth at bedtime.     multivitamin (RENA-VIT) TABS tablet Take 1 tablet by mouth daily.     nitroGLYCERIN (NITROSTAT) 0.4 MG SL tablet Place 0.4 mg under the tongue every 5 (five) minutes as needed for chest pain.     omeprazole (PRILOSEC) 20 MG capsule Take 20 mg by mouth daily.     Potassium Chloride ER 20 MEQ TBCR Take 20 mEq by mouth daily.     risperiDONE (RISPERDAL) 0.5 MG tablet Take 0.5 mg by mouth at bedtime.     rOPINIRole (REQUIP) 1 MG tablet Take 1 mg by mouth daily.     senna-docusate (SENOKOT-S) 8.6-50 MG tablet Take 2 tablets by mouth at bedtime.     sertraline (ZOLOFT) 100 MG tablet Take 100 mg by mouth in the morning.     traMADol (ULTRAM) 50 MG tablet Take 50 mg by mouth every 4 (four) hours as needed.       Assessment: 71 y.o. male with chest pain for heparin  Goal of Therapy:  Heparin level 0.3-0.7 units/ml Monitor platelets by anticoagulation protocol: Yes   Plan:  Heparin 4000 units IV bolus, then start heparin 1350 units/hr Check heparin level in 8 hours.   Caryl Pina 10/10/2021,4:40 AM

## 2021-10-10 NOTE — Progress Notes (Signed)
ASSUMPTION OF CARE NOTE   10/10/2021 9:25 AM  [name] was seen and examined.  The H&P by the admitting provider, orders, imaging was reviewed.  Please see new orders.  Will continue to follow.  STILL WAITING ON HOME MEDS TO BE RECONCILED BY PHARMACY.   71 y.o. male with medical history significant of ESRD on peritoneal dialysis, COPD, CAD with history of NSTEMI, hypertension, CHF, type 2 diabetes mellitus who presents to the emergency department due to worsening mid sternal chest pain which started yesterday in the afternoon.  Patient states that pain initially started in the epigastric area and migrated to the chest, it was initially thought that this was due to too much fluid (since patient usually have peritoneal dialysis), so wife drained the dialysate, but the pain continued.  It was described as pressure-like and was rated as 8/10 on pain scale with no radiation.  Chest pain was alleviated by taking 2 nitroglycerin sublingual at home.  EMS was activated and patient was taken to the ED for further evaluation and management.  He denies fever, chills, cough, shortness of breath, nausea, vomiting or diarrhea.   ED Course:  In the emergency department, he was hemodynamically stable.  BP on arrival was 112/43, and other vital signs were within normal range.  Work-up in the ED showed leukocytosis and elevated MCV, BMP showed 280, BUN/creatinine 56/5.51 eGFR of 11.  Troponin x 2 - 55 > 201. Chest x-ray showed chronic appearing increased lung markings with mild right basilar atelectasis. Patient was started on heparin drip, nitroglycerin patch was placed.  Cardiology at [facility] Dr. Alfred Levins was consulted by ED physician and cardiology team will evaluate and consult with patient on arrival to [facility].  Hospitalist was asked to admit patient for further evaluation and management.  10/10/2021: Cardiology at [facility] evaluated and made arrangements for patien to transfer directly to cath lab at [facility] and will arrange for  bed after cath.  I reached out to Dr. Marval Regal to make aware of need for PD and transfer to [facility].      Assessment and Plan: * NSTEMI (non-ST elevated myocardial infarction) [facility]) -- seen by cardiology at [facility], they are recommending transfer to [facility] for cath.  They have made arrangements to transfer patient directly from ED to cath lab.  I have notified bed placement of need to place in bed at [facility] after cath.  Continue IV heparin, cardiology team started IV nitroglycerin infusion and restarted home oral medications  Depression -- resumed home sertraline 100 mg daily  Restless leg syndrome -- stable, reordered home ropinirole  ESRD (end stage renal disease) ([place]) -- on PD, I reached out to Dr. Arty Baumgartner to make him aware of patient transfer to [facility] and need for PD.  Atypical chest pain -- arrangements being made for transfer to [facility] for cath,  -- HS troponins are rising  Chronic diastolic CHF (congestive heart failure) ([place]) -- currently compensated, transfer to [facility] for cath pending  Benign essential HTN -- uncontrolled at this time, cardiology added IV nitroglycerin and resumed home oral meds  Mixed hyperlipidemia -- restarted home atorvastatin 40 mg daily    Vitals:   10/10/21 0856 10/10/21 0900  BP: (!) 173/87 (!) 176/91  Pulse: 69 70  Resp: 14 17  Temp:    SpO2: 97% 96%    Results for orders placed or performed during the hospital encounter of 03/49/17  Basic metabolic panel  Result Value Ref Range   Sodium 133 (L)  135 - 145 mmol/L   Potassium 3.9 3.5 - 5.1 mmol/L   Chloride 99 98 - 111 mmol/L   CO2 26 22 - 32 mmol/L   Glucose, Bld 280 (H) 70 - 99 mg/dL   BUN 56 (H) 8 - 23 mg/dL   Creatinine, Ser 5.51 (H) 0.61 - 1.24 mg/dL   Calcium 9.0 8.9 - 10.3 mg/dL   GFR, Estimated 11 (L) >60 mL/min   Anion gap 8 5 - 15  CBC  Result Value Ref Range   WBC 13.4 (H) 4.0 - 10.5 K/uL   RBC 3.66 (L) 4.22 - 5.81 MIL/uL   Hemoglobin 13.0 13.0 - 17.0 g/dL   HCT 38.2 (L) 39.0 - 52.0 %    MCV 104.4 (H) 80.0 - 100.0 fL   MCH 35.5 (H) 26.0 - 34.0 pg   MCHC 34.0 30.0 - 36.0 g/dL   RDW 13.8 11.5 - 15.5 %   Platelets 162 150 - 400 K/uL   nRBC 0.0 0.0 - 0.2 %  Folate  Result Value Ref Range   Folate 13.3 >5.9 ng/mL  Vitamin B12  Result Value Ref Range   Vitamin B-12 761 180 - 914 pg/mL  Magnesium  Result Value Ref Range   Magnesium 2.0 1.7 - 2.4 mg/dL  Phosphorus  Result Value Ref Range   Phosphorus 2.6 2.5 - 4.6 mg/dL  CBG monitoring, ED  Result Value Ref Range   Glucose-Capillary 168 (H) 70 - 99 mg/dL  Troponin I (High Sensitivity)  Result Value Ref Range   Troponin I (High Sensitivity) 55 (H) <18 ng/L  Troponin I (High Sensitivity)  Result Value Ref Range   Troponin I (High Sensitivity) 201 (HH) <18 ng/L  Troponin I (High Sensitivity)  Result Value Ref Range   Troponin I (High Sensitivity) 545 (HH) <18 ng/L     C. Wynetta Emery, MD [facility]   10/10/2021  1:01 AM How to contact the [facility] Attending or Consulting provider [place] or covering provider during after hours [place], for this patient?  Check the care team in [facility] and look for a) attending/consulting [facility] provider listed and b) the [facility] team listed Log into [email or web address] and use [place]'s universal password to access. If you do not have the password, please contact the hospital operator. Locate the [facility] provider you are looking for under [facility] and page to a number that you can be directly reached. If you still have difficulty reaching the provider, please page the [facility] (Director on Call) for the Hospitalists listed on amion for assistance.

## 2021-10-10 NOTE — Assessment & Plan Note (Signed)
--   on PD, I reached out to Dr. Arty Baumgartner to make him aware of patient transfer to [facility] and need for PD.

## 2021-10-10 NOTE — Progress Notes (Addendum)
Pt brought to cath lab holding, [place] 6, connected to monitor, no c/o pain presently, report received from [facility], IV Ntg infusing at59mg/min into RAC PIV, IV Heparin infusing at 1350units/hour into RAC PIV, NS infusing at 10cc/hr into Right hand PIV, LUE fistula noted, Peritoneal dialysis catheter noted to abdominal area safety maintained

## 2021-10-10 NOTE — Plan of Care (Signed)
  Problem: Cardiovascular: Goal: Ability to achieve and maintain adequate cardiovascular perfusion will improve Outcome: Progressing Goal: Vascular access site(s) Level 0-1 will be maintained Outcome: Progressing   Problem: Education: Goal: Knowledge of General Education information will improve Description: Including pain rating scale, medication(s)/side effects and non-pharmacologic comfort measures Outcome: Progressing   Problem: Health Behavior/Discharge Planning: Goal: Ability to manage health-related needs will improve Outcome: Progressing   Problem: Pain Managment: Goal: General experience of comfort will improve Outcome: Progressing   Problem: Safety: Goal: Ability to remain free from injury will improve Outcome: Progressing   Problem: Skin Integrity: Goal: Risk for impaired skin integrity will decrease Outcome: Progressing   

## 2021-10-10 NOTE — Assessment & Plan Note (Signed)
--   uncontrolled at this time, cardiology added IV nitroglycerin and resumed home oral meds

## 2021-10-10 NOTE — Interval H&P Note (Signed)
History and Physical Interval Note:  10/10/2021 12:07 PM  [name]  has presented today for surgery, with the diagnosis of chest pain.  The various methods of treatment have been discussed with the patient and family. After consideration of risks, benefits and other options for treatment, the patient has consented to  Procedure(s): LEFT HEART CATH AND CORONARY ANGIOGRAPHY (N/A) as a surgical intervention.  The patient's history has been reviewed, patient examined, no change in status, stable for surgery.  I have reviewed the patient's chart and labs.  Questions were answered to the patient's satisfaction.    Cath Lab Visit (complete for each Cath Lab visit)  Clinical Evaluation Leading to the Procedure:   ACS: Yes.    Non-ACS:    Anginal Classification: CCS IV  Anti-ischemic medical therapy: Maximal Therapy (2 or more classes of medications)  Non-Invasive Test Results: No non-invasive testing performed  Prior CABG: No previous CABG        [name]

## 2021-10-10 NOTE — Assessment & Plan Note (Signed)
--   currently compensated, transfer to [facility] for cath pending

## 2021-10-10 NOTE — ED Notes (Signed)
Patient transported to X-ray 

## 2021-10-10 NOTE — H&P (Signed)
History and Physical    Patient: [name] YPP:[id number] DOB: [birth year] DOA: 10/10/2021 DOS: the patient was seen and examined on 10/10/2021 PCP: Tobe Sos, MD  Patient coming from: Home  Chief Complaint:  Chief Complaint  Patient presents with   Chest Pain   HPI: [name] is a 71 y.o. male with medical history significant of ESRD on peritoneal dialysis, COPD, CAD with history of NSTEMI, hypertension, CHF, type 2 diabetes mellitus who presents to the emergency department due to worsening mid sternal chest pain which started yesterday in the afternoon.  Patient states that pain initially started in the epigastric area and migrated to the chest, it was initially thought that this was due to too much fluid (since patient usually have peritoneal dialysis), so wife drained the dialysate, but the pain continued.  It was described as pressure-like and was rated as 8/10 on pain scale with no radiation.  Chest pain was alleviated by taking 2 nitroglycerin sublingual at home.  EMS was activated and patient was taken to the ED for further evaluation and management.  He denies fever, chills, cough, shortness of breath, nausea, vomiting or diarrhea.  ED Course:  In the emergency department, he was hemodynamically stable.  BP on arrival was 112/43, and other vital signs were within normal range.  Work-up in the ED showed leukocytosis and elevated MCV, BMP showed 280, BUN/creatinine 56/5.51 eGFR of 11.  Troponin x 2 - 55 > 201. Chest x-ray showed chronic appearing increased lung markings with mild right basilar atelectasis. Patient was started on heparin drip, nitroglycerin patch was placed.  Cardiology at [facility] Dr. Alfred Levins was consulted by ED physician and cardiology team will evaluate and consult with patient on arrival to [facility].  Hospitalist was asked to admit patient for further evaluation and management.  Review of Systems: Review of systems as noted in the HPI. All other systems  reviewed and are negative.   Past Medical History:  Diagnosis Date   AAA (abdominal aortic aneurysm) ([place]) 06/2016   Mild increase in size of 3.4 cm infrarenal abdominal aortic aneurysm   Anemia    Aortic atherosclerosis (HCC)    Arthritis    Asthma    Atrial flutter (HCC)    AVF (arteriovenous fistula) (HCC)    Left forearm   CAD (coronary artery disease)    a. s/p prior stenting of RCA in 1999 b. cath in 2003 showing moderate CAD c. NST in 2016 showing small area of ischemic and low-risk   CHF (congestive heart failure) ([place])    CKD (chronic kidney disease), stage V ([place])    kidney transplant evaluation   COPD (chronic obstructive pulmonary disease) ([place])    with ongoing tobacco use and patient failed sham takes   Diverticulosis 2018   Mild sigmoid colon diverticulosis.   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  Reflux esophagitis    hx   Two-vessel coronary artery disease    moderate. by cath in 2003. Status post stenting of the mdi RCA November 1999 normal left ventricular ejection fraction   Wears glasses    Wears hearing aid    B/L   Past Surgical History:  Procedure Laterality Date   A/V FISTULAGRAM Left 04/30/2019   Procedure: A/V FISTULAGRAM;  Surgeon: Elam Dutch, MD;  Location: [place] [facility];  Service: Cardiovascular;  Laterality: Left;   A/V FISTULAGRAM Left 09/22/2019   Procedure: A/V FISTULAGRAM;  Surgeon: Marty Heck, MD;  Location:  [place] [facility];  Service: Cardiovascular;  Laterality: Left;   ABDOMINAL SURGERY     APPENDECTOMY     AV FISTULA PLACEMENT Right 03/11/2018   Procedure: CREATION Brachiocephalic Fistula RIGHT ARM;  Surgeon: Rosetta Posner, MD;  Location: [place];  Service: Vascular;  Laterality: Right;   AV FISTULA PLACEMENT Left 05/06/2019   Procedure: LEFT BRACHIOCEPHALIC ARTERIOVENOUS (AV) FISTULA CREATION;  Surgeon: Marty Heck, MD;  Location: [place];  Service: Vascular;  Laterality: Left;   BACK SURGERY     x3; cages in patient's back since 2000   BIOPSY  11/12/2018   Procedure: BIOPSY;  Surgeon: Laurence Spates, MD;  Location: [facility];  Service: Endoscopy;;   CARDIAC CATHETERIZATION     stent   CHOLECYSTECTOMY     CLOSED REDUCTION SHOULDER DISLOCATION     COLONOSCOPY W/ BIOPSIES AND POLYPECTOMY     COLONOSCOPY WITH PROPOFOL N/A 11/12/2018   Procedure: COLONOSCOPY WITH PROPOFOL;  Surgeon: Laurence Spates, MD;  Location: [facility];  Service: Endoscopy;  Laterality: N/A;   COLONOSCOPY WITH PROPOFOL N/A 10/02/2021   Procedure: COLONOSCOPY WITH PROPOFOL;  Surgeon: Clarene Essex, MD;  Location: [facility];  Service: Gastroenterology;  Laterality: N/A;   CORONARY ANGIOPLASTY     CYSTOSCOPY WITH INSERTION OF UROLIFT N/A 11/06/2020   Procedure: CYSTOSCOPY WITH INSERTION OF UROLIFT;  Surgeon: Cleon Gustin, MD;  Location: [facility];  Service: Urology;  Laterality: N/A;   diabetic ulcers     DIAGNOSTIC LAPAROSCOPY     DIALYSIS/PERMA CATHETER REMOVAL N/A 09/22/2019   Procedure: DIALYSIS/PERMA CATHETER REMOVAL;  Surgeon: Marty Heck, MD;  Location: [place] [facility];  Service: Cardiovascular;  Laterality: N/A;   ESOPHAGOGASTRODUODENOSCOPY (EGD) WITH PROPOFOL N/A 11/12/2018   Procedure: ESOPHAGOGASTRODUODENOSCOPY (EGD) WITH PROPOFOL;  Surgeon: Laurence Spates, MD;  [facility];  Service: Endoscopy;  Laterality: N/A;   GROIN EXPLORATION     HEMOSTASIS CLIP PLACEMENT   11/12/2018   Procedure: HEMOSTASIS CLIP PLACEMENT;  Surgeon: Laurence Spates, MD;  [facility];  Service: Endoscopy;;   HIP ARTHROPLASTY Left    gamma nail   INSERT / REPLACE / [employer] (AV) ARTEGRAFT ARM Left 03/18/2018   Procedure: INSERTION OF ARTERIOVENOUS (AV) FISTULA;  Surgeon: Rosetta Posner, MD;  Location: [place];  Service: Vascular;  Laterality: Left;   IR FLUORO GUIDE CV LINE RIGHT  04/11/2019   IR US GUIDE VASC ACCESS RIGHT  04/11/2019   LEFT HEART CATH AND CORONARY ANGIOGRAPHY N/A 07/23/2019   Procedure: LEFT HEART CATH AND CORONARY ANGIOGRAPHY;  Surgeon: Martinique, Peter M, MD;  Location: [place] [facility];  Service: Cardiovascular;  Laterality: N/A;   LIGATION ARTERIOVENOUS GORTEX GRAFT Right 03/18/2018   Procedure: LIGATION ARTERIOVENOUS FISTULA;  Surgeon: Rosetta Posner, MD;  Location: [place];  Service: Vascular;  Laterality: Right;  OSTECTOMY Left 04/23/2017   Procedure: OSTECTOMY LEFT GREAT TOE;  Surgeon: [name], DPM;  Location: [facility];  Service: Podiatry;  Laterality: Left;  left great toe   POLYPECTOMY  11/12/2018   Procedure: POLYPECTOMY;  Surgeon: Laurence Spates, MD;  [facility]: [facility];  Service: Endoscopy;;   POLYPECTOMY  10/02/2021   Procedure: POLYPECTOMY;  Surgeon: Clarene Essex, MD;  Location: [facility];  Service: Gastroenterology;;   SPINAL CORD STIMULATOR INSERTION N/A 12/20/2020   Procedure: Permanent Spinal Cord Stimulator  Lead Implant with Fluoroscopy and Battery Implant;  Surgeon: Reece Agar, MD;  Location: [place];  Service: Neurosurgery;  Laterality: N/A;   TUMOR REMOVAL     small intestine x3   VISCERAL ANGIOGRAPHY N/A 08/02/2019   Procedure: VISCERAL ANGIOGRAPHY;  Surgeon: Waynetta Sandy, MD;  Location: [place] [facility];  Service: Cardiovascular;  Laterality: N/A;    Social History:  reports that he quit smoking about 22 months ago. His smoking use included cigarettes. He  started smoking about 53 years ago. He has never used smokeless tobacco. He reports that he does not currently use alcohol. He reports that he does not use drugs.   Allergies  Allergen Reactions   Hydrocodone Itching   Other     Opiods cause hallucinations and delusions has to be given risperidone to settle down Other reaction(s): hallucinations/delirium   Oxycodone-Acetaminophen Other (See Comments)    Family History  Problem Relation Age of Onset   Diabetes Mother    Alcohol abuse Father      Prior to Admission medications   Medication Sig Start Date End Date Taking? Authorizing Provider  albuterol (VENTOLIN HFA) 108 (90 Base) MCG/ACT inhaler Inhale 2 puffs into the lungs every 6 (six) hours as needed for wheezing or shortness of breath.     [provider]  AMBULATORY NON FORMULARY MEDICATION Medication Name: Trimix  PGE 30 mcg Pap 30 mg Phent 1 mg  0.3 mls by intracavernosal route as needed 07/03/21   Cleon Gustin, MD  amLODipine (NORVASC) 5 MG tablet Take 5 mg by mouth daily. 08/06/21   [provider]  aspirin EC 81 MG EC tablet Take 1 tablet (81 mg total) by mouth daily. 07/25/19   Isaac Bliss, Rayford Halsted, MD  atorvastatin (LIPITOR) 40 MG tablet Take 1 tablet by mouth once daily 03/16/21   Arnoldo Lenis, MD  beclomethasone (QVAR) 80 MCG/ACT inhaler Inhale 2 puffs into the lungs in the morning and at bedtime.    [provider]  CALCIUM CITRATE PO Take 600 mg by mouth in the morning and at bedtime.    [provider]  carvedilol (COREG) 25 MG tablet Take 1 tablet (25 mg total) by mouth 2 (two) times daily with a meal. 09/13/21   Branch, Alphonse Guild, MD  Cholecalciferol (VITAMIN D3) 2000 units TABS Take 2,000 Units by mouth daily.     [provider]  clobetasol (TEMOVATE) 0.05 % external solution Apply 1 application topically 2 (two) times daily as needed (itching).    [provider]  denosumab (PROLIA) 60 MG/ML  SOSY injection Inject 60 mg into the skin every 6 (six) months.    [provider]  Ergocalciferol (CALCIFEROL PO) Take 0.25 mg by mouth 3 (three) times a week.    [provider]  fluticasone (CUTIVATE) 0.05 % cream Apply topically.    [provider]  linaclotide (LINZESS) 290 MCG CAPS capsule 1 cap(s) orally once a day for 30  day(s)    [provider]  magnesium oxide (MAG-OX) 400 MG tablet Take 400 mg by mouth daily.    [provider]  Melatonin 10 MG TABS Take 20 mg by mouth at bedtime.    [provider]  multivitamin (RENA-VIT) TABS tablet Take 1 tablet by mouth daily.    [provider]  nitroGLYCERIN (NITROSTAT) 0.4 MG SL tablet Place 0.4 mg under the tongue every 5 (five) minutes as needed for chest pain.    [provider]  omeprazole (PRILOSEC) 20 MG capsule Take 20 mg by mouth daily.    [provider]  Potassium Chloride ER 20 MEQ TBCR Take 20 mEq by mouth daily. 09/28/20   [provider]  risperiDONE (RISPERDAL) 0.5 MG tablet Take 0.5 mg by mouth at bedtime.    [provider]  rOPINIRole (REQUIP) 1 MG tablet Take 1 mg by mouth daily. 03/02/21   [provider]  senna-docusate (SENOKOT-S) 8.6-50 MG tablet Take 2 tablets by mouth at bedtime.    [provider]  sertraline (ZOLOFT) 100 MG tablet Take 100 mg by mouth in the morning. 09/22/20   [provider]  traMADol (ULTRAM) 50 MG tablet Take 50 mg by mouth every 4 (four) hours as needed. 09/13/21   [provider]    Physical Exam: BP (!) 151/69   Pulse 70   Temp 98.1 F (36.7 C) (Oral)   Resp (!) 21   Ht 6' 4"  (1.93 m)   Wt 113.4 kg   SpO2 96%   BMI 30.43 kg/m   General: 71 y.o. year-old male well developed well nourished in no acute distress.  Alert and oriented x3. HEENT: NCAT, EOMI Neck: Supple, trachea medial Cardiovascular: Regular rate and rhythm with no rubs or gallops.  No  thyromegaly or JVD noted.  Pacemaker insertion site noted.  No lower extremity edema. 2/4 pulses in all 4 extremities. Respiratory: Clear to auscultation with no wheezes or rales. Good inspiratory effort. Abdomen: Dialysis catheter noted in place with no surrounding erythema.  Abdomen soft, nontender nondistended with normal bowel sounds x4 quadrants. Muskuloskeletal: No cyanosis, clubbing or edema noted bilaterally Neuro: CN II-XII intact, strength 5/5 x 4, sensation, reflexes intact Skin: No ulcerative lesions noted or rashes Psychiatry: Judgement and insight appear normal. Mood is appropriate for condition and setting          Labs on Admission:  Basic Metabolic Panel: Recent Labs  Lab 10/10/21 0126  NA 133*  K 3.9  CL 99  CO2 26  GLUCOSE 280*  BUN 56*  CREATININE 5.51*  CALCIUM 9.0   Liver Function Tests: No results for input(s): AST, ALT, ALKPHOS, BILITOT, PROT, ALBUMIN in the last 168 hours. No results for input(s): LIPASE, AMYLASE in the last 168 hours. No results for input(s): AMMONIA in the last 168 hours. CBC: Recent Labs  Lab 10/10/21 0126  WBC 13.4*  HGB 13.0  HCT 38.2*  MCV 104.4*  PLT 162   Cardiac Enzymes: No results for input(s): CKTOTAL, CKMB, CKMBINDEX, TROPONINI in the last 168 hours.  BNP (last 3 results) No results for input(s): BNP in the last 8760 hours.  ProBNP (last 3 results) No results for input(s): PROBNP in the last 8760 hours.  CBG: No results for input(s): GLUCAP in the last 168 hours.  Radiological Exams on Admission: DG Chest 2 View  Result Date: 10/10/2021 CLINICAL DATA:  Central chest pain. EXAM: CHEST - 2 VIEW COMPARISON:  November 23, 2019  FINDINGS: There is a dual lead AICD. The heart size and mediastinal contours are within normal limits. The lungs are hyperinflated with mild, diffuse, chronic appearing increased lung markings. Mild atelectasis is seen within the right lung base. There is no evidence of a pleural effusion or  pneumothorax. A spinal stimulator wire is seen with its distal tip at the approximate level of T8. The visualized skeletal structures are unremarkable. IMPRESSION: Chronic appearing increased lung markings with mild right basilar atelectasis. Electronically Signed   By: Virgina Norfolk M.D.   On: 10/10/2021 02:09    EKG: I independently viewed the EKG done and my findings are as followed: Normal sinus/ectopic atrial rhythm at a rate of 72 bpm  Assessment/Plan Present on Admission:  NSTEMI (non-ST elevated myocardial infarction) ()  Atypical chest pain  ESRD (end stage renal disease) ([place])  Benign essential HTN  Chronic diastolic CHF (congestive heart failure) ([place])  Principal Problem:   NSTEMI (non-ST elevated myocardial infarction) ([place]) Active Problems:   Mixed hyperlipidemia   Benign essential HTN   Chronic diastolic CHF (congestive heart failure) (HCC)   Atypical chest pain   ESRD (end stage renal disease) (HCC)   Restless leg syndrome   Depression   Obesity (BMI 30-39.9)  Atypical chest pain possibly secondary to NSTEMI Cardiovascular risk factors include hypertension, hyperlipidemia, obesity, T2DM Continue telemetry  Troponins x2 -55 > 201 EKG showed Normal sinus/ectopic atrial rhythm at a rate of 72 bpm Chest pain improved with nitroglycerin taken PTA, continue nitroglycerin patch Continue IV heparin drip Cardiology will be consulted to help decide if Stress test is needed in am Versus other  diagnostic modalities.    Continue aspirin 81 mg p.o. daily  Leukocytosis possibly reactive WBC 13.4, no obvious sign of any acute infectious process Continue to monitor WBC  Elevated MCV MCV 104.4; folate and vitamin B12 levels will be checked  ESRD on peritoneal dialysis Patient's dialysis last night was suspended since it was thought that patient's pain was related to too much fluid from the dialysis Patient will be admitted to [facility], nephrology will be consulted  for maintenance dialysis  ??Type 2 diabetes mellitus with hyperglycemia Patient was not on any diabetic medication by med rec A1c about 9 months ago so was 5.5 (Non- diabetic) CBG 280, hemoglobin A1c will be checked Continue CBG every 4 hours  Essential hypertension (uncontrolled) Continue amlodipine, Coreg  Mixed hyperlipidemia Continue Lipitor  CAD status post stent placement Continue aspirin, Lipitor, Coreg, nitroglycerin as needed  Diastolic CHF Echocardiogram done on 10/19/2020 showed LVEF of 60 to 65%.  Moderate LVH.  G1 DD. Continue aspirin, Coreg, Lipitor Continue total input/output, daily weights and fluid restriction Continue Cardiac diet   COPD Continue albuterol, QVAR  Restless leg syndrome Continue Requip  Depression Continue Zoloft  Obesity (BMI 30.43 kg/m) Diet and lifestyle modification  DVT prophylaxis: Heparin drip  Code Status: Full code  Consults: Cardiology  Family Communication: Wife at bedside (all questions answered to satisfaction)  Severity of Illness: The appropriate patient status for this patient is OBSERVATION. Observation status is judged to be reasonable and necessary in order to provide the required intensity of service to ensure the patient's safety. The patient's presenting symptoms, physical exam findings, and initial radiographic and laboratory data in the context of their medical condition is felt to place them at decreased risk for further clinical deterioration. Furthermore, it is anticipated that the patient will be medically stable for discharge from the hospital within 2 midnights of  admission.   Author: Bernadette Hoit, DO 10/10/2021 6:59 AM  For on call review [email or web address].

## 2021-10-10 NOTE — ED Notes (Signed)
  Transition of Care [facility]) Screening Note   Patient Details  Name: [name] Date of Birth: 30-Nov-1950   Transition of Care [facility]) CM/SW Contact:    Iona Beard, [place] Phone Number: 10/10/2021, 10:26 AM    Transition of Care Department [facility]) has reviewed patient and no TOC needs have been identified at this time. We will continue to monitor patient advancement through interdisciplinary progression rounds. If new patient transition needs arise, please place a TOC consult.

## 2021-10-10 NOTE — ED Notes (Signed)
I spoke with Ermalinda Barrios, Cardiology PA, and she reports pt can have his morning meds with sips of water. Dr. Wynetta Emery notified of Cardiology's decision and PO medications will be administered this morning.

## 2021-10-10 NOTE — Assessment & Plan Note (Signed)
--   stable, reordered home ropinirole

## 2021-10-10 NOTE — Consult Note (Addendum)
Reason for Consult: ESRD Referring Physician: Dr. Doristine Bosworth  Chief Complaint: Chest pain  Assessment/Plan: 1.Renal- ESRD on PD.  Usually with 3L fill 4 exchanges at night and then 5th fill with Dianeal till following night. He usually alternates 1.5/2.5 one day with only 1.5 following day and has done very well with this regimen.   Will write order to PD tonight; unfortunately we don't have Dianeal in the hospital so will perform 5 exchanges alternating 1.5/2.5.    2. NSTEMI - s/p cath.  3. Hypertension/volume  - does not seem overloaded-  Minimal UF with HD today  4. Anemia  - not an issue right now 5. Restless leg syndrome - on ropinirole 6. HFpEF - compensated.    HPI: [name] is an 71 y.o. male DM, COPD, CASHD w/ h/o NSTEMI +  PCI,  CHF, ESRD on PD; he also has a patent lt BCF.  He lives in [place]- follows with Candiss Norse with [facility].  Wife is a former Marine scientist and takes care of his PD needs at home.  He p/w  chest pain central and yesterday after the 5th fill described as pressure like and clamping. His wife thought maybe it was related to the fill and immediately drained him but unfortunately the pain did not improve until he used nitroglycerin. In the ED troponin increased from 55 -> 201 and patient was started on heparin gtt, nitroglycerin patch. Patient was transferred to [facility] for a cardiac cath. Currently he doesn't have anymore chest pain.   ROS Pertinent items are noted in HPI.  Chemistry and CBC: Creat  Date/Time Value Ref Range Status  07/28/2015 04:06 PM 2.01 (H) 0.70 - 1.25 mg/dL Final   Creatinine, Ser  Date/Time Value Ref Range Status  10/10/2021 01:26 AM 5.51 (H) 0.61 - 1.24 mg/dL Final  10/02/2021 11:36 AM 5.80 (H) 0.61 - 1.24 mg/dL Final  12/20/2020 06:52 AM 6.00 (H) 0.61 - 1.24 mg/dL Final  11/06/2020 09:04 AM 5.00 (H) 0.61 - 1.24 mg/dL Final  10/10/2020 08:37 AM 6.39 (H) 0.61 - 1.24 mg/dL Final  10/08/2020 05:25 PM 6.48 (H) 0.61 - 1.24 mg/dL Final  09/21/2020  11:16 AM 7.23 (H) 0.61 - 1.24 mg/dL Final  11/23/2019 12:23 PM 4.72 (H) 0.61 - 1.24 mg/dL Final  11/17/2019 05:55 PM 7.01 (H) 0.61 - 1.24 mg/dL Final  09/22/2019 09:11 AM 6.60 (H) 0.61 - 1.24 mg/dL Final  08/02/2019 12:11 PM 7.90 (H) 0.61 - 1.24 mg/dL Final  07/24/2019 07:56 PM 4.91 (H) 0.61 - 1.24 mg/dL Final    Comment:    DELTA CHECK NOTED DIALYSIS   07/24/2019 05:27 AM 8.20 (H) 0.61 - 1.24 mg/dL Final  07/23/2019 05:57 AM 6.77 (H) 0.61 - 1.24 mg/dL Final  07/22/2019 01:43 PM 5.32 (H) 0.61 - 1.24 mg/dL Final  05/06/2019 08:09 AM 4.50 (H) 0.61 - 1.24 mg/dL Final  04/30/2019 08:08 AM 4.80 (H) 0.61 - 1.24 mg/dL Final  04/12/2019 06:14 AM 5.02 (H) 0.61 - 1.24 mg/dL Final  04/11/2019 08:28 PM 4.58 (H) 0.61 - 1.24 mg/dL Final  04/11/2019 01:07 PM 5.74 (H) 0.61 - 1.24 mg/dL Final  04/10/2019 08:34 PM 6.06 (H) 0.61 - 1.24 mg/dL Final  04/10/2019 12:44 PM 6.33 (H) 0.61 - 1.24 mg/dL Final  04/09/2019 10:03 AM 6.30 (H) 0.61 - 1.24 mg/dL Final  02/23/2019 11:13 AM 5.31 (H) 0.61 - 1.24 mg/dL Final  01/19/2019 12:13 PM 5.68 (H) 0.61 - 1.24 mg/dL Final  01/05/2019 11:51 AM 4.50 (H) 0.61 - 1.24 mg/dL Final  12/22/2018 10:43 AM 6.02 (H) 0.61 - 1.24 mg/dL Final  11/24/2018 11:15 AM 5.60 (H) 0.61 - 1.24 mg/dL Final  10/20/2018 11:53 AM 4.49 (H) 0.61 - 1.24 mg/dL Final  10/14/2018 02:56 PM 4.53 (H) 0.61 - 1.24 mg/dL Final  09/08/2018 11:26 AM 4.80 (H) 0.61 - 1.24 mg/dL Final  03/19/2018 12:13 AM 3.73 (H) 0.61 - 1.24 mg/dL Final  03/18/2018 05:45 PM 3.85 (H) 0.61 - 1.24 mg/dL Final  08/06/2017 02:13 PM 3.23 (H) 0.76 - 1.27 mg/dL Final  05/10/2017 06:02 AM 3.12 (H) 0.61 - 1.24 mg/dL Final  05/09/2017 12:27 PM 3.24 (H) 0.61 - 1.24 mg/dL Final  04/14/2017 01:59 PM 2.60 (H) 0.61 - 1.24 mg/dL Final  04/08/2016 06:14 AM 2.11 (H) 0.61 - 1.24 mg/dL Final  04/07/2016 06:18 AM 2.17 (H) 0.61 - 1.24 mg/dL Final  04/06/2016 06:37 AM 2.19 (H) 0.61 - 1.24 mg/dL Final  04/05/2016 06:24 AM 2.33 (H) 0.61 - 1.24  mg/dL Final  04/04/2016 12:53 PM 2.59 (H) 0.61 - 1.24 mg/dL Final  08/07/2015 09:35 AM 1.81 (H) 0.76 - 1.27 mg/dL Final  09/28/2014 04:15 AM 1.86 (H) 0.61 - 1.24 mg/dL Final  09/27/2014 05:58 AM 2.02 (H) 0.61 - 1.24 mg/dL Final  09/26/2014 09:43 PM 2.26 (H) 0.61 - 1.24 mg/dL Final   Recent Labs  Lab 10/10/21 0126 10/10/21 0345  NA 133*  --   K 3.9  --   CL 99  --   CO2 26  --   GLUCOSE 280*  --   BUN 56*  --   CREATININE 5.51*  --   CALCIUM 9.0  --   PHOS  --  2.6   Recent Labs  Lab 10/10/21 0126  WBC 13.4*  HGB 13.0  HCT 38.2*  MCV 104.4*  PLT 162   Liver Function Tests: No results for input(s): AST, ALT, ALKPHOS, BILITOT, PROT, ALBUMIN in the last 168 hours. No results for input(s): LIPASE, AMYLASE in the last 168 hours. No results for input(s): AMMONIA in the last 168 hours. Cardiac Enzymes: No results for input(s): CKTOTAL, CKMB, CKMBINDEX, TROPONINI in the last 168 hours. Iron Studies: No results for input(s): IRON, TIBC, TRANSFERRIN, FERRITIN in the last 72 hours. PT/INR: '@LABRCNTIP'$ (inr:5)  Xrays/Other Studies: ) Results for orders placed or performed during the hospital encounter of 10/10/21 (from the past 48 hour(s))  Basic metabolic panel     Status: Abnormal   Collection Time: 10/10/21  1:26 AM  Result Value Ref Range   Sodium 133 (L) 135 - 145 mmol/L   Potassium 3.9 3.5 - 5.1 mmol/L   Chloride 99 98 - 111 mmol/L   CO2 26 22 - 32 mmol/L   Glucose, Bld 280 (H) 70 - 99 mg/dL    Comment: Glucose reference range applies only to samples taken after fasting for at least 8 hours.   BUN 56 (H) 8 - 23 mg/dL   Creatinine, Ser 5.51 (H) 0.61 - 1.24 mg/dL   Calcium 9.0 8.9 - 10.3 mg/dL   GFR, Estimated 11 (L) >60 mL/min    Comment: (NOTE) Calculated using the CKD-EPI Creatinine Equation (2021)    Anion gap 8 5 - 15    Comment: Performed at [facility], [address] [place], [place] [address]  CBC     Status: Abnormal   Collection Time: 10/10/21  1:26 AM   Result Value Ref Range   WBC 13.4 (H) 4.0 - 10.5 K/uL   RBC 3.66 (L) 4.22 - 5.81 MIL/uL   Hemoglobin  13.0 13.0 - 17.0 g/dL   HCT 38.2 (L) 39.0 - 52.0 %   MCV 104.4 (H) 80.0 - 100.0 fL   MCH 35.5 (H) 26.0 - 34.0 pg   MCHC 34.0 30.0 - 36.0 g/dL   RDW 13.8 11.5 - 15.5 %   Platelets 162 150 - 400 K/uL   nRBC 0.0 0.0 - 0.2 %    Comment: Performed at [facility], [address], [place], [place] [address]  Troponin I (High Sensitivity)     Status: Abnormal   Collection Time: 10/10/21  1:26 AM  Result Value Ref Range   Troponin I (High Sensitivity) 55 (H) <18 ng/L    Comment: (NOTE) Elevated high sensitivity troponin I (hsTnI) values and significant  changes across serial measurements may suggest ACS but many other  chronic and acute conditions are known to elevate hsTnI results.  Refer to the "Links" section for chest pain algorithms and additional  guidance. Performed at [facility], [address], [place], [place] [address]   Hemoglobin A1c     Status: Abnormal   Collection Time: 10/10/21  1:26 AM  Result Value Ref Range   Hgb A1c MFr Bld 5.7 (H) 4.8 - 5.6 %    Comment: (NOTE) Pre diabetes:          5.7%-6.4%  Diabetes:              >6.4%  Glycemic control for   <7.0% adults with diabetes    Mean Plasma Glucose 116.89 mg/dL    Comment: Performed at [facility] [address] [place], [place] [address]  Troponin I (High Sensitivity)     Status: Abnormal   Collection Time: 10/10/21  3:45 AM  Result Value Ref Range   Troponin I (High Sensitivity) 201 (HH) <18 ng/L    Comment: CRITICAL RESULT CALLED TO, READ BACK BY AND VERIFIED WITH: PRUITT,G AT 4:25AM ON 10/10/21 BY FESTERMAN,C (NOTE) Elevated high sensitivity troponin I (hsTnI) values and significant  changes across serial measurements may suggest ACS but many other  chronic and acute conditions are known to elevate hsTnI results.  Refer to the Links section for chest pain algorithms and additional   guidance. Performed at [facility], [address], [place], [place] [address]   Folate     Status: None   Collection Time: 10/10/21  3:45 AM  Result Value Ref Range   Folate 13.3 >5.9 ng/mL    Comment: Performed at [facility], [address] [place], [place] [address]  Vitamin B12     Status: None   Collection Time: 10/10/21  3:45 AM  Result Value Ref Range   Vitamin B-12 761 180 - 914 pg/mL    Comment: (NOTE) This assay is not validated for testing neonatal or myeloproliferative syndrome specimens for Vitamin B12 levels. Performed at [facility], [address], [place], [place] [address]   HIV Antibody (routine testing w rflx)     Status: None   Collection Time: 10/10/21  3:45 AM  Result Value Ref Range   HIV Screen 4th Generation wRfx Non Reactive Non Reactive    Comment: Performed at [facility], [place] [address], [place], [place] [address]  Magnesium     Status: None   Collection Time: 10/10/21  3:45 AM  Result Value Ref Range   Magnesium 2.0 1.7 - 2.4 mg/dL    Comment: Performed at [facility], [address], [place], [place] [address]  Phosphorus  Status: None   Collection Time: 10/10/21  3:45 AM  Result Value Ref Range   Phosphorus 2.6 2.5 - 4.6 mg/dL    Comment: Performed at [facility], [address], [place], [place] [address]  Troponin I (High Sensitivity)     Status: Abnormal   Collection Time: 10/10/21  7:30 AM  Result Value Ref Range   Troponin I (High Sensitivity) 545 (HH) <18 ng/L    Comment: CRITICAL RESULT CALLED TO, READ BACK BY AND VERIFIED WITH: WHITE,M AT 8:30AM ON 10/10/21 BY FESTERMAN,C (NOTE) Elevated high sensitivity troponin I (hsTnI) values and significant  changes across serial measurements may suggest ACS but many other  chronic and acute conditions are known to elevate hsTnI results.  Refer to the Links section for chest pain algorithms and additional  guidance. Performed at [facility], [address] [place], [place]  [address]   CBG monitoring, ED     Status: Abnormal   Collection Time: 10/10/21  8:30 AM  Result Value Ref Range   Glucose-Capillary 168 (H) 70 - 99 mg/dL    Comment: Glucose reference range applies only to samples taken after fasting for at least 8 hours.   DG Chest 2 View  Result Date: 10/10/2021 CLINICAL DATA:  Central chest pain. EXAM: CHEST - 2 VIEW COMPARISON:  November 23, 2019 FINDINGS: There is a dual lead AICD. The heart size and mediastinal contours are within normal limits. The lungs are hyperinflated with mild, diffuse, chronic appearing increased lung markings. Mild atelectasis is seen within the right lung base. There is no evidence of a pleural effusion or pneumothorax. A spinal stimulator wire is seen with its distal tip at the approximate level of T8. The visualized skeletal structures are unremarkable. IMPRESSION: Chronic appearing increased lung markings with mild right basilar atelectasis. Electronically Signed   By: Virgina Norfolk M.D.   On: 10/10/2021 02:09   CARDIAC CATHETERIZATION  Result Date: 10/10/2021   Mid LM to Dist LM lesion is 25% stenosed.   Mid LAD lesion is 40% stenosed.   Prox RCA lesion is 35% stenosed.   Dist RCA lesion is 50% stenosed.   1st Diag-3 lesion is 90% stenosed.   1st Diag-1 lesion is 80% stenosed.   1st Diag-2 lesion is 100% stenosed.   Ost Cx to Prox Cx lesion is 100% stenosed.   LV end diastolic pressure is mildly elevated.  Occluded proximal left circumflex and subtotally occluded branch of a large bifurcating diagonal which are new versus previous study from 2021.  Multiple attempts to cross the proximal left circumflex with different wires as well as a super cross catheter were unsuccessful.  Further attempts were deferred.  Multiple attempts to cross the subtotally occluded diagonal were also unsuccessful.  Medical management will be pursued. 2.  Moderate diffuse disease elsewhere largely unchanged versus study from 2021. 3.  LVEDP of 21 mmHg.  Recommendation: Medical treatment of ACS with dual antiplatelet therapy for 6 months and continued optimal therapy for cardiovascular disease.    PMH:   Past Medical History:  Diagnosis Date   AAA (abdominal aortic aneurysm) ([place]) 06/2016   Mild increase in size of 3.4 cm infrarenal abdominal aortic aneurysm   Anemia    Aortic atherosclerosis (HCC)    Arthritis    Asthma    Atrial flutter (HCC)    AVF (arteriovenous fistula) (HCC)    Left forearm   CAD (coronary artery disease)    a. s/p prior stenting of RCA in 1999  b. cath in 2003 showing moderate CAD c. NST in 2016 showing small area of ischemic and low-risk   CHF (congestive heart failure) (HCC)    CKD (chronic kidney disease), stage V ([place])    kidney transplant evaluation   COPD (chronic obstructive pulmonary disease) ([place])    with ongoing tobacco use and patient failed sham takes   Diverticulosis 2018   Mild sigmoid colon diverticulosis.   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    Reflux esophagitis    hx   Two-vessel coronary artery disease    moderate. by cath in 2003. Status post stenting of the mdi RCA November 1999 normal left ventricular ejection fraction   Wears glasses    Wears hearing aid    B/L    PSH:   Past Surgical History:  Procedure Laterality Date   A/V FISTULAGRAM Left 04/30/2019   Procedure: A/V FISTULAGRAM;  Surgeon: Elam Dutch, MD;  Location: [facility];  Service:  Cardiovascular;  Laterality: Left;   A/V FISTULAGRAM Left 09/22/2019   Procedure: A/V FISTULAGRAM;  Surgeon: Marty Heck, MD;  Location: [facility];  Service: Cardiovascular;  Laterality: Left;   ABDOMINAL SURGERY     APPENDECTOMY     AV FISTULA PLACEMENT Right 03/11/2018   Procedure: CREATION Brachiocephalic Fistula RIGHT ARM;  Surgeon: Rosetta Posner, MD;  Location: [facility];  Service: Vascular;  Laterality: Right;   AV FISTULA PLACEMENT Left 05/06/2019   Procedure: LEFT BRACHIOCEPHALIC ARTERIOVENOUS (AV) FISTULA CREATION;  Surgeon: Marty Heck, MD;  Location: [facility];  Service: Vascular;  Laterality: Left;   BACK SURGERY     x3; cages in patient's back since 2000   BIOPSY  11/12/2018   Procedure: BIOPSY;  Surgeon: Laurence Spates, MD;  Location: [facility];  Service: Endoscopy;;   CARDIAC CATHETERIZATION     stent   CHOLECYSTECTOMY     CLOSED REDUCTION SHOULDER DISLOCATION     COLONOSCOPY W/ BIOPSIES AND POLYPECTOMY     COLONOSCOPY WITH PROPOFOL N/A 11/12/2018   Procedure: COLONOSCOPY WITH PROPOFOL;  Surgeon: Laurence Spates, MD;  Location: [facility];  Service: Endoscopy;  Laterality: N/A;   COLONOSCOPY WITH PROPOFOL N/A 10/02/2021   Procedure: COLONOSCOPY WITH PROPOFOL;  Surgeon: Clarene Essex, MD;  [facility]: [facility];  Service: Gastroenterology;  Laterality: N/A;   CORONARY ANGIOPLASTY     CYSTOSCOPY WITH INSERTION OF UROLIFT N/A 11/06/2020   Procedure: CYSTOSCOPY WITH INSERTION OF UROLIFT;  Surgeon: Cleon Gustin, MD;  Location: [facility];  Service: Urology;  Laterality: N/A;   diabetic ulcers     DIAGNOSTIC LAPAROSCOPY     DIALYSIS/PERMA CATHETER REMOVAL N/A 09/22/2019   Procedure: DIALYSIS/PERMA CATHETER REMOVAL;  Surgeon: Marty Heck, MD;  Location: [facility];  Service: Cardiovascular;  Laterality: N/A;   ESOPHAGOGASTRODUODENOSCOPY (EGD) WITH PROPOFOL N/A 11/12/2018   Procedure: ESOPHAGOGASTRODUODENOSCOPY (EGD)  WITH PROPOFOL;   Surgeon: Laurence Spates, MD;  [facility];  Service: Endoscopy;  Laterality: N/A;   GROIN EXPLORATION     HEMOSTASIS CLIP PLACEMENT  11/12/2018   Procedure: HEMOSTASIS CLIP PLACEMENT;  Surgeon: Laurence Spates, MD;  [facility];  Service: Endoscopy;;   HIP ARTHROPLASTY Left    gamma nail   INSERT / REPLACE / [place] (AV) ARTEGRAFT ARM Left 03/18/2018   Procedure: INSERTION OF ARTERIOVENOUS (AV) FISTULA;  Surgeon: Rosetta Posner, MD;  Location: [facility];  Service: Vascular;  Laterality: Left;   IR FLUORO GUIDE CV LINE RIGHT  04/11/2019   IR US GUIDE VASC ACCESS RIGHT  04/11/2019   LEFT HEART CATH AND CORONARY ANGIOGRAPHY N/A 07/23/2019   Procedure: LEFT HEART CATH AND CORONARY ANGIOGRAPHY;  Surgeon: Martinique, Peter M, MD;  Location: [facility];  Service: Cardiovascular;  Laterality: N/A;   LIGATION ARTERIOVENOUS GORTEX GRAFT Right 03/18/2018   Procedure: LIGATION ARTERIOVENOUS FISTULA;  Surgeon: Rosetta Posner, MD;  Location: [facility];  Service: Vascular;  Laterality: Right;   OSTECTOMY Left 04/23/2017   Procedure: OSTECTOMY LEFT GREAT TOE;  Surgeon: [name], DPM;  Location: [facility];  Service: Podiatry;  Laterality: Left;  left great toe   POLYPECTOMY  11/12/2018   Procedure: POLYPECTOMY;  Surgeon: Laurence Spates, MD;  [facility];  Service: Endoscopy;;   POLYPECTOMY  10/02/2021   Procedure: POLYPECTOMY;  Surgeon: Clarene Essex, MD;  Location[facility];  Service: Gastroenterology;;   SPINAL CORD STIMULATOR INSERTION N/A 12/20/2020   Procedure: Permanent Spinal Cord Stimulator  Lead Implant with Fluoroscopy and Battery Implant;  Surgeon: Reece Agar, MD;  Location: [facility];  Service: Neurosurgery;  Laterality: N/A;   TUMOR REMOVAL     small intestine x3   VISCERAL ANGIOGRAPHY N/A 08/02/2019   Procedure: VISCERAL ANGIOGRAPHY;  Surgeon: Waynetta Sandy, MD;  Location: [facility];  Service:  Cardiovascular;  Laterality: N/A;    Allergies:  Allergies  Allergen Reactions   Hydrocodone Itching   Other     Opiods cause hallucinations and delusions has to be given risperidone to settle down Other reaction(s): hallucinations/delirium   Oxycodone-Acetaminophen Other (See Comments)    Medications:   Prior to Admission medications   Medication Sig Start Date End Date Taking? Authorizing Provider  albuterol (VENTOLIN HFA) 108 (90 Base) MCG/ACT inhaler Inhale 2 puffs into the lungs every 6 (six) hours as needed for wheezing or shortness of breath.    Yes [provider]  amLODipine (NORVASC) 5 MG tablet Take 5 mg by mouth daily. 08/06/21  Yes [provider]  aspirin EC 81 MG EC tablet Take 1 tablet (81 mg total) by mouth daily. 07/25/19  Yes Isaac Bliss, Rayford Halsted, MD  atorvastatin (LIPITOR) 40 MG tablet Take 1 tablet by mouth once daily Patient taking differently: Take 40 mg by mouth daily. 03/16/21  Yes BranchAlphonse Guild, MD  beclomethasone (QVAR) 80 MCG/ACT inhaler Inhale 2 puffs into the lungs in the morning and at bedtime.   Yes [provider]  CALCIUM CITRATE PO Take 600 mg by mouth in the morning and at bedtime.   Yes [provider]  carvedilol (COREG) 25 MG tablet Take 1 tablet (25 mg total) by mouth 2 (two) times daily with a meal. 09/13/21  Yes Branch, Alphonse Guild, MD  Cholecalciferol (VITAMIN D3) 2000 units TABS Take 2,000 Units by mouth daily.  Yes [provider]  clobetasol (TEMOVATE) 0.05 % external solution Apply 1 application topically 2 (two) times daily as needed (itching).   Yes [provider]  denosumab (PROLIA) 60 MG/ML SOSY injection Inject 60 mg into the skin every 6 (six) months.   Yes [provider]  Ergocalciferol (CALCIFEROL PO) Take 0.25 mg by mouth daily.   Yes [provider]  linaclotide (LINZESS) 290 MCG CAPS capsule Take 290 mcg by mouth daily before breakfast.   Yes  [provider]  magnesium oxide (MAG-OX) 400 MG tablet Take 400 mg by mouth daily.   Yes [provider]  Melatonin 10 MG TABS Take 20 mg by mouth at bedtime.   Yes [provider]  multivitamin (RENA-VIT) TABS tablet Take 1 tablet by mouth daily.   Yes [provider]  nitroGLYCERIN (NITROSTAT) 0.4 MG SL tablet Place 0.4 mg under the tongue every 5 (five) minutes as needed for chest pain.   Yes [provider]  omeprazole (PRILOSEC) 20 MG capsule Take 20 mg by mouth daily.   Yes [provider]  Potassium Chloride ER 20 MEQ TBCR Take 20 mEq by mouth daily. 09/28/20  Yes [provider]  risperiDONE (RISPERDAL) 0.5 MG tablet Take 0.5 mg by mouth at bedtime.   Yes [provider]  rOPINIRole (REQUIP) 1 MG tablet Take 1 mg by mouth daily. 03/02/21  Yes [provider]  senna-docusate (SENOKOT-S) 8.6-50 MG tablet Take 2 tablets by mouth at bedtime as needed for mild constipation.   Yes [provider]  sertraline (ZOLOFT) 100 MG tablet Take 25 mg by mouth in the morning. 09/22/20  Yes [provider]  traMADol (ULTRAM) 50 MG tablet Take 50 mg by mouth every 4 (four) hours as needed for moderate pain. 09/13/21  Yes [provider]    Discontinued Meds:   Medications Discontinued During This Encounter  Medication Reason   beclomethasone (QVAR) 80 MCG/ACT inhaler 2 puff    aspirin chewable tablet 81 mg    0.9 %  sodium chloride infusion    fluticasone (CUTIVATE) 0.05 % cream Completed Course   AMBULATORY NON FORMULARY MEDICATION Patient Preference   clopidogrel (PLAVIX) tablet Patient Discharge   iohexol (OMNIPAQUE) 350 MG/ML injection Patient Discharge   Radial Cocktail/Verapamil only Patient Discharge   heparin sodium (porcine) injection Patient Discharge   lidocaine (PF) (XYLOCAINE) 1 % injection Patient Discharge   Heparin (Porcine) in NaCl 1000-0.9 UT/500ML-% SOLN Patient Discharge    midazolam (VERSED) injection Patient Discharge   fentaNYL (SUBLIMAZE) injection Patient Discharge   0.9 %  sodium chloride infusion Patient Transfer   nitroGLYCERIN 50 mg in dextrose 5 % 250 mL (0.2 mg/mL) infusion Patient Transfer   0.9 %  sodium chloride infusion Patient Transfer   heparin ADULT infusion 100 units/mL (25000 units/273m)     Social History:  reports that he quit smoking about 22 months ago. His smoking use included cigarettes. He started smoking about 53 years ago. He has never used smokeless tobacco. He reports that he does not currently use alcohol. He reports that he does not use drugs.  Family History:   Family History  Problem Relation Age of Onset   Diabetes Mother    Alcohol abuse Father     Blood pressure (!) 145/71, pulse (!) 59, temperature 97.8 F (36.6 C), temperature source Oral, resp. rate 16, height '6\' 4"'$  (1.93 m), weight 113.4 kg, SpO2 99 %. General: alert, very pleasant, NAD Neck: no  JVD Heart: RRR Lungs: clear b/l Abdomen: PD cath in right lower quad-  No tunnel tenderness-  Soft, no tenderness/ rebound or guarding Extremities: no edema Skin: warm and dry Neuro: alert and oriented x3 and appropriate  Left upper arm BCF-  Patent with good bruit       Jahad Old, Hunt Oris, MD 10/10/2021, 4:57 PM

## 2021-10-10 NOTE — Hospital Course (Signed)
71 y.o. male with medical history significant of ESRD on peritoneal dialysis, COPD, CAD with history of NSTEMI, hypertension, CHF, type 2 diabetes mellitus who presents to the emergency department due to worsening mid sternal chest pain which started yesterday in the afternoon.  Patient states that pain initially started in the epigastric area and migrated to the chest, it was initially thought that this was due to too much fluid (since patient usually have peritoneal dialysis), so wife drained the dialysate, but the pain continued.  It was described as pressure-like and was rated as 8/10 on pain scale with no radiation.  Chest pain was alleviated by taking 2 nitroglycerin sublingual at home.  EMS was activated and patient was taken to the ED for further evaluation and management.  He denies fever, chills, cough, shortness of breath, nausea, vomiting or diarrhea.   ED Course:  In the emergency department, he was hemodynamically stable.  BP on arrival was 112/43, and other vital signs were within normal range.  Work-up in the ED showed leukocytosis and elevated MCV, BMP showed 280, BUN/creatinine 56/5.51 eGFR of 11.  Troponin x 2 - 55 > 201. Chest x-ray showed chronic appearing increased lung markings with mild right basilar atelectasis. Patient was started on heparin drip, nitroglycerin patch was placed.  Cardiology at [facility] Dr. Alfred Levins was consulted by ED physician and cardiology team will evaluate and consult with patient on arrival to [facility].  Hospitalist was asked to admit patient for further evaluation and management.  10/10/2021: Cardiology at [facility] evaluated and made arrangements for patien to transfer directly to cath lab at [facility] and will arrange for bed after cath.  I reached out to Dr. Marval Regal to make aware of need for PD and transfer to [facility].

## 2021-10-10 NOTE — ED Triage Notes (Signed)
Pt c/o central chest pain that started mid afternoon. Pt reports they drained some fluid off thinking it might be the fluid but pain got worse and worse. Pt reports pain is constant. Pt describes pain as constant pressure.

## 2021-10-10 NOTE — Consult Note (Addendum)
Cardiology Consultation:   Patient ID: [name] MRN: [id number]; DOB: 07/24/1950  Admit date: 10/10/2021 Date of Consult: 10/10/2021  PCP:  Tobe Sos, MD   [facility] Providers Cardiologist:  Carlyle Dolly, MD  Electrophysiologist:  Thompson Grayer, MD       Patient Profile:   [name] is a 71 y.o. male with a hx of CAD who is being seen 10/10/2021 for the evaluation of NSTEMI at the request of Dr. Wynetta Emery.  History of Present Illness:   [name] is a 71 yo male patient with history of CAD with prior stent to the [place], NSTEMI 07/2019 diffuse severe two-vessel CAD 17 D percent large diagonal lesions 75% proximal circumflex and 90% mid circumflex difficult to pursue PCI recommend medical management, Symptomatic bradycardia status post pacemaker 2019,PAF/flutter low A-fib burden by pacer check Eliquis stopped 10/2018 due to anemia by Dr. 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  Patient came in last night with chest tightness that started yest afternoon when trying to do dialysis. Some relief from 2 NTG but continued chest tightness so came in. Still with chest tightness "2" on scale 1-10. Has been stable without angina until yest afternoon.    Past Medical History:  Diagnosis Date   AAA (abdominal aortic aneurysm) ([place]) 06/2016   Mild increase in size of 3.4 cm infrarenal abdominal aortic aneurysm   Anemia    Aortic atherosclerosis (HCC)    Arthritis    Asthma    Atrial flutter (HCC)    AVF (arteriovenous fistula) (HCC)    Left forearm   CAD (coronary artery disease)    a. s/p prior stenting of RCA in 1999 b. cath in 2003 showing moderate CAD c. NST in 2016 showing small area of ischemic and low-risk   CHF (congestive heart failure) ([place])    CKD (chronic  kidney disease), stage V ([place])    kidney transplant evaluation   COPD (chronic obstructive pulmonary disease) ([place])    with ongoing tobacco use and patient failed sham takes   Diverticulosis 2018   Mild sigmoid colon diverticulosis.   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    Reflux esophagitis    hx   Two-vessel coronary artery disease    moderate. by cath in 2003. Status post stenting of the mdi RCA November 1999 normal left ventricular ejection fraction   Wears glasses    Wears hearing aid    B/L    Past Surgical History:  Procedure Laterality Date   A/V FISTULAGRAM Left 04/30/2019   Procedure: A/V FISTULAGRAM;  Surgeon: Elam Dutch, MD;  Location: [place] [facility];  Service: Cardiovascular;  Laterality: Left;   A/V FISTULAGRAM Left 09/22/2019   Procedure: A/V FISTULAGRAM;  Surgeon: Marty Heck, MD;  Location: [place] [facility];  Service: Cardiovascular;  Laterality: Left;   ABDOMINAL SURGERY     APPENDECTOMY     AV FISTULA PLACEMENT Right 03/11/2018   Procedure: CREATION Brachiocephalic Fistula RIGHT ARM;  Surgeon: Rosetta Posner, MD;  Location: [place];  Service: Vascular;  Laterality: Right;   AV FISTULA PLACEMENT Left 05/06/2019   Procedure: LEFT BRACHIOCEPHALIC ARTERIOVENOUS (AV) FISTULA CREATION;  Surgeon: Marty Heck, MD;  Location: [place];  Service: Vascular;  Laterality: Left;   BACK SURGERY     x3; cages in patient's back since 2000    BIOPSY  11/12/2018   Procedure: BIOPSY;  Surgeon: Laurence Spates, MD;  [facility]: [facility];  Service: Endoscopy;;   CARDIAC CATHETERIZATION     stent   CHOLECYSTECTOMY     CLOSED REDUCTION SHOULDER DISLOCATION     COLONOSCOPY W/ BIOPSIES AND POLYPECTOMY     COLONOSCOPY WITH PROPOFOL N/A 11/12/2018   Procedure: COLONOSCOPY WITH PROPOFOL;  Surgeon: Laurence Spates, MD;  [facility];  Service: Endoscopy;  Laterality: N/A;   COLONOSCOPY WITH PROPOFOL N/A 10/02/2021   Procedure: COLONOSCOPY WITH PROPOFOL;  Surgeon: Clarene Essex, MD;  [facility];  Service: Gastroenterology;  Laterality: N/A;   CORONARY ANGIOPLASTY     CYSTOSCOPY WITH INSERTION OF UROLIFT N/A 11/06/2020   Procedure: CYSTOSCOPY WITH INSERTION OF UROLIFT;  Surgeon: Cleon Gustin, MD;  Location: [facility];  Service: Urology;  Laterality: N/A;   diabetic ulcers     DIAGNOSTIC LAPAROSCOPY     DIALYSIS/PERMA CATHETER REMOVAL N/A 09/22/2019   Procedure: DIALYSIS/PERMA CATHETER REMOVAL;  Surgeon: Marty Heck, MD;  Location: [place] [facility];  Service: Cardiovascular;  Laterality: N/A;   ESOPHAGOGASTRODUODENOSCOPY (EGD) WITH PROPOFOL N/A 11/12/2018   Procedure: ESOPHAGOGASTRODUODENOSCOPY (EGD) WITH PROPOFOL;  Surgeon: Laurence Spates, MD;  [facility];  Service: Endoscopy;  Laterality: N/A;   GROIN EXPLORATION     HEMOSTASIS CLIP PLACEMENT  11/12/2018   Procedure: HEMOSTASIS CLIP PLACEMENT;  Surgeon: Laurence Spates, MD;  [facility];  Service: Endoscopy;;   HIP ARTHROPLASTY Left    gamma nail   INSERT / REPLACE / [place] (AV) ARTEGRAFT ARM Left 03/18/2018   Procedure: INSERTION OF ARTERIOVENOUS (AV) FISTULA;  Surgeon: Rosetta Posner, MD;  Location: [place];  Service: Vascular;  Laterality: Left;   IR FLUORO GUIDE CV LINE RIGHT  04/11/2019   IR US GUIDE VASC ACCESS RIGHT  04/11/2019   LEFT HEART CATH AND CORONARY ANGIOGRAPHY N/A 07/23/2019    Procedure: LEFT HEART CATH AND CORONARY ANGIOGRAPHY;  Surgeon: Martinique, Peter M, MD;  Location: [place] [facility];  Service: Cardiovascular;  Laterality: N/A;   LIGATION ARTERIOVENOUS GORTEX GRAFT Right 03/18/2018   Procedure: LIGATION ARTERIOVENOUS FISTULA;  Surgeon: Rosetta Posner, MD;  Location: [facility];  Service: Vascular;  Laterality: Right;   OSTECTOMY Left 04/23/2017   Procedure: OSTECTOMY LEFT GREAT TOE;  Surgeon: [name], DPM;  Location: [facility];  Service: Podiatry;  Laterality: Left;  left great toe   POLYPECTOMY  11/12/2018   Procedure: POLYPECTOMY;  Surgeon: Laurence Spates, MD;  [facility];  Service: Endoscopy;;   POLYPECTOMY  10/02/2021   Procedure: POLYPECTOMY;  Surgeon: Clarene Essex, MD;  Location: [facility];  Service: Gastroenterology;;   SPINAL CORD STIMULATOR INSERTION N/A 12/20/2020   Procedure: Permanent Spinal Cord Stimulator  Lead Implant with Fluoroscopy and Battery Implant;  Surgeon: Lenord Carbo  S, MD;  Location: [place];  Service: Neurosurgery;  Laterality: N/A;   TUMOR REMOVAL     small intestine x3   VISCERAL ANGIOGRAPHY N/A 08/02/2019   Procedure: VISCERAL ANGIOGRAPHY;  Surgeon: Waynetta Sandy, MD;  Location: [place] [facility];  Service: Cardiovascular;  Laterality: N/A;     Home Medications:  Prior to Admission medications   Medication Sig Start Date End Date Taking? Authorizing Provider  albuterol (VENTOLIN HFA) 108 (90 Base) MCG/ACT inhaler Inhale 2 puffs into the lungs every 6 (six) hours as needed for wheezing or shortness of breath.     [provider]  AMBULATORY NON FORMULARY MEDICATION Medication Name: Trimix  PGE 30 mcg Pap 30 mg Phent 1 mg  0.3 mls by intracavernosal route as needed 07/03/21   Cleon Gustin, MD  amLODipine (NORVASC) 5 MG tablet Take 5 mg by mouth daily. 08/06/21   [provider]  aspirin EC 81 MG EC tablet Take 1 tablet (81 mg total) by mouth daily. 07/25/19   Isaac Bliss,  Rayford Halsted, MD  atorvastatin (LIPITOR) 40 MG tablet Take 1 tablet by mouth once daily 03/16/21   Arnoldo Lenis, MD  beclomethasone (QVAR) 80 MCG/ACT inhaler Inhale 2 puffs into the lungs in the morning and at bedtime.    [provider]  CALCIUM CITRATE PO Take 600 mg by mouth in the morning and at bedtime.    [provider]  carvedilol (COREG) 25 MG tablet Take 1 tablet (25 mg total) by mouth 2 (two) times daily with a meal. 09/13/21   Branch, Alphonse Guild, MD  Cholecalciferol (VITAMIN D3) 2000 units TABS Take 2,000 Units by mouth daily.     [provider]  clobetasol (TEMOVATE) 0.05 % external solution Apply 1 application topically 2 (two) times daily as needed (itching).    [provider]  denosumab (PROLIA) 60 MG/ML SOSY injection Inject 60 mg into the skin every 6 (six) months.    [provider]  Ergocalciferol (CALCIFEROL PO) Take 0.25 mg by mouth 3 (three) times a week.    [provider]  fluticasone (CUTIVATE) 0.05 % cream Apply topically.    [provider]  linaclotide (LINZESS) 290 MCG CAPS capsule 1 cap(s) orally once a day for 30 day(s)    [provider]  magnesium oxide (MAG-OX) 400 MG tablet Take 400 mg by mouth daily.    [provider]  Melatonin 10 MG TABS Take 20 mg by mouth at bedtime.    [provider]  multivitamin (RENA-VIT) TABS tablet Take 1 tablet by mouth daily.    [provider]  nitroGLYCERIN (NITROSTAT) 0.4 MG SL tablet Place 0.4 mg under the tongue every 5 (five) minutes as needed for chest pain.    [provider]  omeprazole (PRILOSEC) 20 MG capsule Take 20 mg by mouth daily.    [provider]  Potassium Chloride ER 20 MEQ TBCR Take 20 mEq by mouth daily. 09/28/20   [provider]  risperiDONE (RISPERDAL) 0.5 MG tablet Take 0.5 mg by mouth at bedtime.    [provider]  rOPINIRole (REQUIP) 1 MG tablet Take 1 mg by mouth  daily. 03/02/21   [provider]  senna-docusate (SENOKOT-S) 8.6-50 MG tablet Take 2 tablets by mouth at bedtime.    [provider]  sertraline (ZOLOFT) 100 MG tablet Take 100 mg by mouth in the morning. 09/22/20   [provider]  traMADol Veatrice Bourbon) 50  MG tablet Take 50 mg by mouth every 4 (four) hours as needed. 09/13/21   [provider]    Inpatient Medications: Scheduled Meds:  amLODipine  5 mg Oral Daily   aspirin EC  81 mg Oral Daily   atorvastatin  40 mg Oral Daily   budesonide (PULMICORT) nebulizer solution  0.5 mg Nebulization BID   carvedilol  25 mg Oral BID WC   pantoprazole  40 mg Oral Daily   rOPINIRole  1 mg Oral Daily   sertraline  100 mg Oral q AM   Continuous Infusions:  heparin 1,350 Units/hr (10/10/21 0510)   PRN Meds: albuterol, nitroGLYCERIN  Allergies:    Allergies  Allergen Reactions   Hydrocodone Itching   Other     Opiods cause hallucinations and delusions has to be given risperidone to settle down Other reaction(s): hallucinations/delirium   Oxycodone-Acetaminophen Other (See Comments)    Social History:   Social History   Tobacco Use   Smoking status: Former    Years: 40.00    Types: Cigarettes    Start date: 05/20/1968    Quit date: 11/30/2019    Years since quitting: 1.8   Smokeless tobacco: Never  Substance Use Topics   Alcohol use: Not Currently    Alcohol/week: 0.0 standard drinks    Comment: rare     Family History:     Family History  Problem Relation Age of Onset   Diabetes Mother    Alcohol abuse Father      ROS:  Please see the history of present illness.  Review of Systems  Constitutional: Negative.  HENT: Negative.    Cardiovascular:  Positive for chest pain.  Respiratory: Negative.    Endocrine: Negative.   Hematologic/Lymphatic: Negative.   Musculoskeletal: Negative.   Gastrointestinal: Negative.   Genitourinary: Negative.   Neurological: Negative.    All other ROS reviewed  and negative.     Physical Exam/Data:   Vitals:   10/10/21 0730 10/10/21 0740 10/10/21 0807 10/10/21 0809  BP: (!) 170/84   (!) 151/95  Pulse: 70   70  Resp: 19   15  Temp:      TempSrc:      SpO2: 100% 100% 100% 100%  Weight:      Height:        Intake/Output Summary (Last 24 hours) at 10/10/2021 0823 Last data filed at 10/10/2021 0718 Gross per 24 hour  Intake --  Output 350 ml  Net -350 ml      10/10/2021    1:09 AM 10/02/2021   11:30 AM 06/06/2021    2:26 PM  Last 3 Weights  Weight (lbs) 250 lb 255 lb 240 lb  Weight (kg) 113.399 kg 115.667 kg 108.863 kg     Body mass index is 30.43 kg/m.  General:  Obese, in no acute distress  HEENT: normal Neck: no JVD Vascular: bilateral carotid bruits; Distal pulses 2+ bilaterally Cardiac:  normal S1, S2; RRR; 2/6 systolic murmur LSB Lungs:  clear to auscultation bilaterally, no wheezing, rhonchi or rales  Abd: soft, nontender, no hepatomegaly  Ext: no edema Musculoskeletal:  No deformities, BUE and BLE strength normal and equal Skin: warm and dry  Neuro:  CNs 2-12 intact, no focal abnormalities noted Psych:  Normal affect   EKG:  The EKG was personally reviewed and demonstrates: NSR with poor R wave progression and nonspecific ST changes   Telemetry:  Telemetry was personally reviewed and demonstrates:  NSR  Relevant CV Studies:  05/2001 Cath   FINDINGS:   1. Left main trunk: Medium caliber vessel. This is a long vessel with a   distal taper of 20-30%.   2. Left anterior descending artery: This is a medium caliber vessel that   provides a large bifurcating first diagonal branch in the proximal segment   and before then extending to the apex, the LAD tapers significantly after   the diagonal branch. There is moderate disease of 50-60% after the   diagonal branch and then a focal eccentric narrowing of 60-70% in the mid   section of the LAD. The diagonal branch has moderate disease of 30-40% in   the proximal segment. The  lateral division is a small caliber vessel with   an ostial narrowing of 60%.   3. Left circumflex artery: This is a small caliber vessel that provides a   trivial first marginal branch in the mid section and a medium caliber   second marginal branch distally. The mid AV circumflex at the takeoff of   the first and second diagonal branches had moderate diffuse disease of   60-70%.   4. Right coronary artery: Dominant. This is a large caliber vessel that   provides a posterior descending artery and two posteroventricular branches   in the terminal segment. The right coronary artery has evidence of an   existing stent in the mid section that is widely patent. Prior to stent is   a focal discrete narrowing of 40-50%. Distal to the stent is moderate   disease of 30%. The distal branch vessels also have mild disease of 30%.   5. Left ventricle: Normal end-systolic and end-diastolic dimensions. Overall   left ventricular function is well preserved. Ejection fraction is greater   than 65%. No mitral regurgitation. LV pressure is 180/10, aortic is   180/90. LV EDP equals 20.   ASSESSMENT AND PLAN: Mr. Whitefield is a 71 year old gentleman with moderate   three-vessel coronary artery disease. The patients plaque burden in the left   anterior descending artery and circumflex appears to have increased somewhat   compared to his previous angiogram. Unfortunately, the mid and distal left   anterior descending artery is a small caliber vessel with a long diffuse   segment of disease and would be associated with a high restenosis rate.   Similarly, the circumflex would suffer the same fate.   Further assessment with a stress imaging study would be prudent to isolate the   area of ischemia. Percutaneous intervention may then be targeted if   indicated. In addition, aggressive medical therapy should be pursued as the   patient currently is not on any anti-anginal therapy and has poorly controlled    hypertension. The patient will also be counseled regarding smoking cessation.      07/2013 PFTs   Mild obstruction       10/2014 Lexiscan   Unable to adequately ambulate to treadmill due to right hip pain. Lexiscan employed.   Defect 1: There is a small defect of mild severity present in the mid inferior and apical inferior location. This defect is partially reversible.   This is a low risk study.   Nuclear stress EF: 58%.   Small region of apical inferior ischemia      07/2015 Echo Study Conclusions   - Left ventricle: The cavity size was normal. Wall thickness was   increased increased in a pattern of mild to moderate LVH.   Systolic function was normal. The estimated ejection  fraction was   in the range of 60% to 65%. Diastolic function is abnormal,   indeterminate grade. Wall motion was normal; there were no   regional wall motion abnormalities. - Aortic valve: Mildly calcified annulus. Trileaflet; mildly   thickened leaflets. Valve area (VTI): 2.39 cm^2. Valve area   (Vmax): 2.58 cm^2. Valve area (Vmean): 2.7 cm^2. - Mitral valve: Mildly calcified annulus. Normal thickness leaflets   . - Technically adequate study.   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 Recommend followup by ultrasound in 2 years. This recommendation follows ACR consensus guidelines:      Jan 2019 nuclear stress T wave inversions in inferior leads and nonspecific T wave abnormalities in V6 seen throughout study. The study is normal. No myocardial ischemia or scar. This is a low risk study. Nuclear stress EF: 56%.     07/2017 event monitor 14 day event monitor Min HR 51, Max HR 115, Avg HR 69 Telemetry tracings show sinus rhythm and rate controlled atrial fibrillation Reported symptoms correlated with rate controlled afib     07/2019 cath Mid LM to Dist LM lesion is 25% stenosed. Mid LAD  lesion is 40% stenosed. 1st Diag-1 lesion is 80% stenosed. 1st Diag-2 lesion is 90% stenosed. 1st Diag-3 lesion is 90% stenosed. Ost Cx to Prox Cx lesion is 75% stenosed. Mid Cx lesion is 90% stenosed. Prox RCA lesion is 35% stenosed. Dist RCA lesion is 50% stenosed. The left ventricular systolic function is normal. LV end diastolic pressure is normal. The left ventricular ejection fraction is 55-65% by visual estimate.   1. Severe 2 vessel obstructive CAD. The RCA and LAD disease is stable compared to 2003. There is progressive disease in a large diagonal branch and the LCx    - the diagonal is a large bifurcating vessel.  It has multiple severe stenoses in the main branch with severely calcified lesions    - 75% proximal LCx and 90% mid LCx. There is an acutely angulated take off from the left main and the vessel is severely calcified. 2. Normal LV function 3. Normal LVEDP   Plan: discussed with Dr Burt Knack. The diagonal is poorly suited for PCI due to diffuse and heavily calcified disease. The LCx is complex and would require atherectomy. The acute angulation would make it technically difficult. For now would recommend aggressive medical therapy    Laboratory Data:  High Sensitivity Troponin:   Recent Labs  Lab 10/10/21 0126 10/10/21 0345  TROPONINIHS 55* 201*     Chemistry Recent Labs  Lab 10/10/21 0126 10/10/21 0345  NA 133*  --   K 3.9  --   CL 99  --   CO2 26  --   GLUCOSE 280*  --   BUN 56*  --   CREATININE 5.51*  --   CALCIUM 9.0  --   MG  --  2.0  GFRNONAA 11*  --   ANIONGAP 8  --      Hematology Recent Labs  Lab 10/10/21 0126  WBC 13.4*  RBC 3.66*  HGB 13.0  HCT 38.2*  MCV 104.4*  MCH 35.5*  MCHC 34.0  RDW 13.8  PLT 162    Radiology/Studies:  DG Chest 2 View  Result Date: 10/10/2021 CLINICAL DATA:  Central chest pain. EXAM: CHEST - 2 VIEW COMPARISON:  November 23, 2019 FINDINGS: There is a dual lead AICD. The heart size  and mediastinal contours  are within normal limits. The lungs are hyperinflated with mild, diffuse, chronic appearing increased lung markings. Mild atelectasis is seen within the right lung base. There is no evidence of a pleural effusion or pneumothorax. A spinal stimulator wire is seen with its distal tip at the approximate level of T8. The visualized skeletal structures are unremarkable. IMPRESSION: Chronic appearing increased lung markings with mild right basilar atelectasis. Electronically Signed   By: Virgina Norfolk M.D.   On: 10/10/2021 02:09     Assessment and Plan:   NSTEMI with ongoing chest pain and troponins 55, 201 with prior stent to the [place], NSTEMI 07/2019 diffuse severe two-vessel CAD 89 D percent large diagonal lesions 75% proximal circumflex and 90% mid circumflex difficult to pursue PCI recommend medical management. Cath done through right groin. Will add IV NTG, continue heparin and transfer to [facility] on medicine service. Will go straight to cath lab. I have reviewed the risks, indications, and alternatives to angioplasty and stenting with the patient. Risks include but are not limited to bleeding, infection, vascular injury, stroke, myocardial infection, arrhythmia, kidney injury, radiation-related injury in the case of prolonged fluoroscopy use, emergency cardiac surgery, and death. The patient understands the risks of serious complication is low (<1%) and patient agrees to proceed.    Symptomatic bradycardia status post pacemaker 2019  PAF/flutter low A-fib burden by pacer check Eliquis stopped 10/2018 due to anemia by Dr. 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    CHA2DS2-VASc Score = 2   This indicates a 2.2% annual risk of stroke. The patient's score is based upon: CHF History: 0 HTN History: 0 Diabetes History: 0 Stroke History: 0 Vascular Disease History: 1 Age Score: 1 Gender Score: 0     For questions or updates, please contact [facility] [place] Please consult [email or web address] for contact info under    Signed, Ermalinda Barrios, PA-C  10/10/2021 8:23 AM    Attending note:  Patient seen and examined.  I reviewed his records and discussed the case with Ms. Bonnell Public PA-C.  Mr. Nishi presents to the [facility], ER describing onset of chest tightness that began last night during routine peritoneal dialysis.  Did have some relief with nitroglycerin but symptoms have continued to wax and wane, remains with mild chest discomfort this morning.  Described as consistent with prior angina.  He has a known history of CAD with remote RCA stenting in 1999 and follow-up cardiac catheterization in 2021 showing severe two-vessel disease predominantly involving a large first diagonal and the circumflex.  Has significant calcification and acutely angulated takeoff of the circumflex with decision to treat medically in 2021.  He also has a history of PAF with low rhythm burden, CHA2DS2-VASc score of 2.  He was taken off anticoagulation by Dr. Rayann Heman in 2020 with history of anemia and GI bleed.  ESRD has been stable on peritoneal dialysis.  On examination he is in no distress.  Still complains of mild chest tightness.  He is afebrile, blood  pressure 151/95, heart rate in the 60s in sinus rhythm by telemetry which I personally reviewed.  Lungs are clear.  Cardiac exam with RRR and 2/6 systolic murmur.  Has a functioning AV fistula in the left upper extremity.  No peripheral edema.  Pertinent lab work includes potassium 3.9, BUN 56, creatinine  5.51, high-sensitivity troponin I of 55 up to 201 up to 545, hemoglobin 13.0, platelets 162.  Chest x-ray reports chronic appearing interstitial markings with right basilar atelectasis.  I personally reviewed his ECG which shows sinus rhythm with prolonged PR interval and nonspecific ST changes.  Patient presents with unstable angina, symptom onset yesterday evening and evidence of NSTEMI.  He has a history of multivessel disease as discussed above, managed medically in 2021 given significant calcification and acutely angulated takeoff of the circumflex from the left main.  Reports no significant angina on medical therapy until the last 24 hours.  He has been started on IV heparin, initiating IV nitroglycerin as well.  Plan to transfer for cardiac catheterization at [facility] and then admission thereafter for further management.  Risks and benefits discussed, and he is in agreement to proceed.  Of note, procedure in 2021 was done via the right femoral approach.  Satira Sark, M.D., F.A.C.C.

## 2021-10-10 NOTE — Assessment & Plan Note (Signed)
--   restarted home atorvastatin 40 mg daily

## 2021-10-10 NOTE — H&P (View-Only) (Signed)
Cardiology Consultation:   Patient ID: CARSTON RIEDL MRN: [id number]; DOB: 1951-04-14  Admit date: 10/10/2021 Date of Consult: 10/10/2021  PCP:  Tobe Sos, MD   [facility] Providers Cardiologist:  Carlyle Dolly, MD  Electrophysiologist:  Thompson Grayer, MD       Patient Profile:   BALIAN SCHALLER is a 71 y.o. male with a hx of CAD who is being seen 10/10/2021 for the evaluation of NSTEMI at the request of Dr. Wynetta Emery.  History of Present Illness:   Mr. Wenzl is a 71 yo male patient with history of CAD with prior stent to the [place], NSTEMI 07/2019 diffuse severe two-vessel CAD 71 D percent large diagonal lesions 75% proximal circumflex and 90% mid circumflex difficult to pursue PCI recommend medical management, Symptomatic bradycardia status post pacemaker 2019,PAF/flutter low A-fib burden by pacer check Eliquis stopped 10/2018 due to anemia by Dr. 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  Patient came in last night with chest tightness that started yest afternoon when trying to do dialysis. Some relief from 2 NTG but continued chest tightness so came in. Still with chest tightness "2" on scale 1-10. Has been stable without angina until yest afternoon.    Past Medical History:  Diagnosis Date   AAA (abdominal aortic aneurysm) ([place]) 06/2016   Mild increase in size of 3.4 cm infrarenal abdominal aortic aneurysm   Anemia    Aortic atherosclerosis (HCC)    Arthritis    Asthma    Atrial flutter (HCC)    AVF (arteriovenous fistula) (HCC)    Left forearm   CAD (coronary artery disease)    a. s/p prior stenting of RCA in 1999 b. cath in 2003 showing moderate CAD c. NST in 2016 showing small area of ischemic and low-risk   CHF (congestive heart failure) ([place])    CKD (chronic  kidney disease), stage V ([place])    kidney transplant evaluation   COPD (chronic obstructive pulmonary disease) ([place])    with ongoing tobacco use and patient failed sham takes   Diverticulosis 2018   Mild sigmoid colon diverticulosis.   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    Reflux esophagitis    hx   Two-vessel coronary artery disease    moderate. by cath in 2003. Status post stenting of the mdi RCA November 1999 normal left ventricular ejection fraction   Wears glasses    Wears hearing aid    B/L    Past Surgical History:  Procedure Laterality Date   A/V FISTULAGRAM Left 04/30/2019   Procedure: A/V FISTULAGRAM;  Surgeon: Elam Dutch, MD;  Location: [place] [facility];  Service: Cardiovascular;  Laterality: Left;   A/V FISTULAGRAM Left 09/22/2019   Procedure: A/V FISTULAGRAM;  Surgeon: Marty Heck, MD;  Location: [place] [facility];  Service: Cardiovascular;  Laterality: Left;   ABDOMINAL SURGERY     APPENDECTOMY     AV FISTULA PLACEMENT Right 03/11/2018   Procedure: CREATION Brachiocephalic Fistula RIGHT ARM;  Surgeon: Rosetta Posner, MD;  Location: [place];  Service: Vascular;  Laterality: Right;   AV FISTULA PLACEMENT Left 05/06/2019   Procedure: LEFT BRACHIOCEPHALIC ARTERIOVENOUS (AV) FISTULA CREATION;  Surgeon: Marty Heck, MD;  Location: [place];  Service: Vascular;  Laterality: Left;   BACK SURGERY     x3; cages in patient's back since 2000    BIOPSY  11/12/2018   Procedure: BIOPSY;  Surgeon: Laurence Spates, MD;  [facility]: [facility];  Service: Endoscopy;;   CARDIAC CATHETERIZATION     stent   CHOLECYSTECTOMY     CLOSED REDUCTION SHOULDER DISLOCATION     COLONOSCOPY W/ BIOPSIES AND POLYPECTOMY     COLONOSCOPY WITH PROPOFOL N/A 11/12/2018   Procedure: COLONOSCOPY WITH PROPOFOL;  Surgeon: Laurence Spates, MD;  [facility];  Service: Endoscopy;  Laterality: N/A;   COLONOSCOPY WITH PROPOFOL N/A 10/02/2021   Procedure: COLONOSCOPY WITH PROPOFOL;  Surgeon: Clarene Essex, MD;  [facility];  Service: Gastroenterology;  Laterality: N/A;   CORONARY ANGIOPLASTY     CYSTOSCOPY WITH INSERTION OF UROLIFT N/A 11/06/2020   Procedure: CYSTOSCOPY WITH INSERTION OF UROLIFT;  Surgeon: Cleon Gustin, MD;  Location: [facility];  Service: Urology;  Laterality: N/A;   diabetic ulcers     DIAGNOSTIC LAPAROSCOPY     DIALYSIS/PERMA CATHETER REMOVAL N/A 09/22/2019   Procedure: DIALYSIS/PERMA CATHETER REMOVAL;  Surgeon: Marty Heck, MD;  Location: [place] [facility];  Service: Cardiovascular;  Laterality: N/A;   ESOPHAGOGASTRODUODENOSCOPY (EGD) WITH PROPOFOL N/A 11/12/2018   Procedure: ESOPHAGOGASTRODUODENOSCOPY (EGD) WITH PROPOFOL;  Surgeon: Laurence Spates, MD;  [facility];  Service: Endoscopy;  Laterality: N/A;   GROIN EXPLORATION     HEMOSTASIS CLIP PLACEMENT  11/12/2018   Procedure: HEMOSTASIS CLIP PLACEMENT;  Surgeon: Laurence Spates, MD;  [facility];  Service: Endoscopy;;   HIP ARTHROPLASTY Left    gamma nail   INSERT / REPLACE / Antoine (AV) ARTEGRAFT ARM Left 03/18/2018   Procedure: INSERTION OF ARTERIOVENOUS (AV) FISTULA;  Surgeon: Rosetta Posner, MD;  Location: [place];  Service: Vascular;  Laterality: Left;   IR FLUORO GUIDE CV LINE RIGHT  04/11/2019   IR US GUIDE VASC ACCESS RIGHT  04/11/2019   LEFT HEART CATH AND CORONARY ANGIOGRAPHY N/A 07/23/2019    Procedure: LEFT HEART CATH AND CORONARY ANGIOGRAPHY;  Surgeon: Martinique, Peter M, MD;  Location: [place] [facility];  Service: Cardiovascular;  Laterality: N/A;   LIGATION ARTERIOVENOUS GORTEX GRAFT Right 03/18/2018   Procedure: LIGATION ARTERIOVENOUS FISTULA;  Surgeon: Rosetta Posner, MD;  [facility];  Service: Vascular;  Laterality: Right;   OSTECTOMY Left 04/23/2017   Procedure: OSTECTOMY LEFT GREAT TOE;  Surgeon: [name], DPM;  Location: [facility];  Service: Podiatry;  Laterality: Left;  left great toe   POLYPECTOMY  11/12/2018   Procedure: POLYPECTOMY;  Surgeon: Laurence Spates, MD;  [facility];  Service: Endoscopy;;   POLYPECTOMY  10/02/2021   Procedure: POLYPECTOMY;  Surgeon: Clarene Essex, MD;  Location: [facility];  Service: Gastroenterology;;   SPINAL CORD STIMULATOR INSERTION N/A 12/20/2020   Procedure: Permanent Spinal Cord Stimulator  Lead Implant with Fluoroscopy and Battery Implant;  Surgeon: Lenord Carbo  S, MD;  Location: [place];  Service: Neurosurgery;  Laterality: N/A;   TUMOR REMOVAL     small intestine x3   VISCERAL ANGIOGRAPHY N/A 08/02/2019   Procedure: VISCERAL ANGIOGRAPHY;  Surgeon: Waynetta Sandy, MD;  Location: [place] [facility];  Service: Cardiovascular;  Laterality: N/A;     Home Medications:  Prior to Admission medications   Medication Sig Start Date End Date Taking? Authorizing Provider  albuterol (VENTOLIN HFA) 108 (90 Base) MCG/ACT inhaler Inhale 2 puffs into the lungs every 6 (six) hours as needed for wheezing or shortness of breath.     [provider]  AMBULATORY NON FORMULARY MEDICATION Medication Name: Trimix  PGE 30 mcg Pap 30 mg Phent 1 mg  0.3 mls by intracavernosal route as needed 07/03/21   Cleon Gustin, MD  amLODipine (NORVASC) 5 MG tablet Take 5 mg by mouth daily. 08/06/21   [provider]  aspirin EC 81 MG EC tablet Take 1 tablet (81 mg total) by mouth daily. 07/25/19   Isaac Bliss,  Rayford Halsted, MD  atorvastatin (LIPITOR) 40 MG tablet Take 1 tablet by mouth once daily 03/16/21   Arnoldo Lenis, MD  beclomethasone (QVAR) 80 MCG/ACT inhaler Inhale 2 puffs into the lungs in the morning and at bedtime.    [provider]  CALCIUM CITRATE PO Take 600 mg by mouth in the morning and at bedtime.    [provider]  carvedilol (COREG) 25 MG tablet Take 1 tablet (25 mg total) by mouth 2 (two) times daily with a meal. 09/13/21   Branch, Alphonse Guild, MD  Cholecalciferol (VITAMIN D3) 2000 units TABS Take 2,000 Units by mouth daily.     [provider]  clobetasol (TEMOVATE) 0.05 % external solution Apply 1 application topically 2 (two) times daily as needed (itching).    [provider]  denosumab (PROLIA) 60 MG/ML SOSY injection Inject 60 mg into the skin every 6 (six) months.    [provider]  Ergocalciferol (CALCIFEROL PO) Take 0.25 mg by mouth 3 (three) times a week.    [provider]  fluticasone (CUTIVATE) 0.05 % cream Apply topically.    [provider]  linaclotide (LINZESS) 290 MCG CAPS capsule 1 cap(s) orally once a day for 30 day(s)    [provider]  magnesium oxide (MAG-OX) 400 MG tablet Take 400 mg by mouth daily.    [provider]  Melatonin 10 MG TABS Take 20 mg by mouth at bedtime.    [provider]  multivitamin (RENA-VIT) TABS tablet Take 1 tablet by mouth daily.    [provider]  nitroGLYCERIN (NITROSTAT) 0.4 MG SL tablet Place 0.4 mg under the tongue every 5 (five) minutes as needed for chest pain.    [provider]  omeprazole (PRILOSEC) 20 MG capsule Take 20 mg by mouth daily.    [provider]  Potassium Chloride ER 20 MEQ TBCR Take 20 mEq by mouth daily. 09/28/20   [provider]  risperiDONE (RISPERDAL) 0.5 MG tablet Take 0.5 mg by mouth at bedtime.    [provider]  rOPINIRole (REQUIP) 1 MG tablet Take 1 mg by mouth  daily. 03/02/21   [provider]  senna-docusate (SENOKOT-S) 8.6-50 MG tablet Take 2 tablets by mouth at bedtime.    [provider]  sertraline (ZOLOFT) 100 MG tablet Take 100 mg by mouth in the morning. 09/22/20   [provider]  traMADol Veatrice Bourbon) 50  MG tablet Take 50 mg by mouth every 4 (four) hours as needed. 09/13/21   [provider]    Inpatient Medications: Scheduled Meds:  amLODipine  5 mg Oral Daily   aspirin EC  81 mg Oral Daily   atorvastatin  40 mg Oral Daily   budesonide (PULMICORT) nebulizer solution  0.5 mg Nebulization BID   carvedilol  25 mg Oral BID WC   pantoprazole  40 mg Oral Daily   rOPINIRole  1 mg Oral Daily   sertraline  100 mg Oral q AM   Continuous Infusions:  heparin 1,350 Units/hr (10/10/21 0510)   PRN Meds: albuterol, nitroGLYCERIN  Allergies:    Allergies  Allergen Reactions   Hydrocodone Itching   Other     Opiods cause hallucinations and delusions has to be given risperidone to settle down Other reaction(s): hallucinations/delirium   Oxycodone-Acetaminophen Other (See Comments)    Social History:   Social History   Tobacco Use   Smoking status: Former    Years: 40.00    Types: Cigarettes    Start date: 05/20/1968    Quit date: 11/30/2019    Years since quitting: 1.8   Smokeless tobacco: Never  Substance Use Topics   Alcohol use: Not Currently    Alcohol/week: 0.0 standard drinks    Comment: rare     Family History:     Family History  Problem Relation Age of Onset   Diabetes Mother    Alcohol abuse Father      ROS:  Please see the history of present illness.  Review of Systems  Constitutional: Negative.  HENT: Negative.    Cardiovascular:  Positive for chest pain.  Respiratory: Negative.    Endocrine: Negative.   Hematologic/Lymphatic: Negative.   Musculoskeletal: Negative.   Gastrointestinal: Negative.   Genitourinary: Negative.   Neurological: Negative.    All other ROS reviewed  and negative.     Physical Exam/Data:   Vitals:   10/10/21 0730 10/10/21 0740 10/10/21 0807 10/10/21 0809  BP: (!) 170/84   (!) 151/95  Pulse: 70   70  Resp: 19   15  Temp:      TempSrc:      SpO2: 100% 100% 100% 100%  Weight:      Height:        Intake/Output Summary (Last 24 hours) at 10/10/2021 0823 Last data filed at 10/10/2021 0718 Gross per 24 hour  Intake --  Output 350 ml  Net -350 ml      10/10/2021    1:09 AM 10/02/2021   11:30 AM 06/06/2021    2:26 PM  Last 3 Weights  Weight (lbs) 250 lb 255 lb 240 lb  Weight (kg) 113.399 kg 115.667 kg 108.863 kg     Body mass index is 30.43 kg/m.  General:  Obese, in no acute distress  HEENT: normal Neck: no JVD Vascular: bilateral carotid bruits; Distal pulses 2+ bilaterally Cardiac:  normal S1, S2; RRR; 2/6 systolic murmur LSB Lungs:  clear to auscultation bilaterally, no wheezing, rhonchi or rales  Abd: soft, nontender, no hepatomegaly  Ext: no edema Musculoskeletal:  No deformities, BUE and BLE strength normal and equal Skin: warm and dry  Neuro:  CNs 2-12 intact, no focal abnormalities noted Psych:  Normal affect   EKG:  The EKG was personally reviewed and demonstrates: NSR with poor R wave progression and nonspecific ST changes   Telemetry:  Telemetry was personally reviewed and demonstrates:  NSR  Relevant CV Studies:  05/2001 Cath   FINDINGS:   1. Left main trunk: Medium caliber vessel. This is a long vessel with a   distal taper of 20-30%.   2. Left anterior descending artery: This is a medium caliber vessel that   provides a large bifurcating first diagonal branch in the proximal segment   and before then extending to the apex, the LAD tapers significantly after   the diagonal branch. There is moderate disease of 50-60% after the   diagonal branch and then a focal eccentric narrowing of 60-70% in the mid   section of the LAD. The diagonal branch has moderate disease of 30-40% in   the proximal segment. The  lateral division is a small caliber vessel with   an ostial narrowing of 60%.   3. Left circumflex artery: This is a small caliber vessel that provides a   trivial first marginal branch in the mid section and a medium caliber   second marginal branch distally. The mid AV circumflex at the takeoff of   the first and second diagonal branches had moderate diffuse disease of   60-70%.   4. Right coronary artery: Dominant. This is a large caliber vessel that   provides a posterior descending artery and two posteroventricular branches   in the terminal segment. The right coronary artery has evidence of an   existing stent in the mid section that is widely patent. Prior to stent is   a focal discrete narrowing of 40-50%. Distal to the stent is moderate   disease of 30%. The distal branch vessels also have mild disease of 30%.   5. Left ventricle: Normal end-systolic and end-diastolic dimensions. Overall   left ventricular function is well preserved. Ejection fraction is greater   than 65%. No mitral regurgitation. LV pressure is 180/10, aortic is   180/90. LV EDP equals 20.   ASSESSMENT AND PLAN: Mr. Schaumburg is a 71 year old gentleman with moderate   three-vessel coronary artery disease. The patients plaque burden in the left   anterior descending artery and circumflex appears to have increased somewhat   compared to his previous angiogram. Unfortunately, the mid and distal left   anterior descending artery is a small caliber vessel with a long diffuse   segment of disease and would be associated with a high restenosis rate.   Similarly, the circumflex would suffer the same fate.   Further assessment with a stress imaging study would be prudent to isolate the   area of ischemia. Percutaneous intervention may then be targeted if   indicated. In addition, aggressive medical therapy should be pursued as the   patient currently is not on any anti-anginal therapy and has poorly controlled    hypertension. The patient will also be counseled regarding smoking cessation.      07/2013 PFTs   Mild obstruction       10/2014 Lexiscan   Unable to adequately ambulate to treadmill due to right hip pain. Lexiscan employed.   Defect 1: There is a small defect of mild severity present in the mid inferior and apical inferior location. This defect is partially reversible.   This is a low risk study.   Nuclear stress EF: 58%.   Small region of apical inferior ischemia      07/2015 Echo Study Conclusions   - Left ventricle: The cavity size was normal. Wall thickness was   increased increased in a pattern of mild to moderate LVH.   Systolic function was normal. The estimated ejection  fraction was   in the range of 60% to 65%. Diastolic function is abnormal,   indeterminate grade. Wall motion was normal; there were no   regional wall motion abnormalities. - Aortic valve: Mildly calcified annulus. Trileaflet; mildly   thickened leaflets. Valve area (VTI): 2.39 cm^2. Valve area   (Vmax): 2.58 cm^2. Valve area (Vmean): 2.7 cm^2. - Mitral valve: Mildly calcified annulus. Normal thickness leaflets   . - Technically adequate study.   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 Recommend followup by ultrasound in 2 years. This recommendation follows ACR consensus guidelines:      Jan 2019 nuclear stress T wave inversions in inferior leads and nonspecific T wave abnormalities in V6 seen throughout study. The study is normal. No myocardial ischemia or scar. This is a low risk study. Nuclear stress EF: 56%.     07/2017 event monitor 14 day event monitor Min HR 51, Max HR 115, Avg HR 69 Telemetry tracings show sinus rhythm and rate controlled atrial fibrillation Reported symptoms correlated with rate controlled afib     07/2019 cath Mid LM to Dist LM lesion is 25% stenosed. Mid LAD  lesion is 40% stenosed. 1st Diag-1 lesion is 80% stenosed. 1st Diag-2 lesion is 90% stenosed. 1st Diag-3 lesion is 90% stenosed. Ost Cx to Prox Cx lesion is 75% stenosed. Mid Cx lesion is 90% stenosed. Prox RCA lesion is 35% stenosed. Dist RCA lesion is 50% stenosed. The left ventricular systolic function is normal. LV end diastolic pressure is normal. The left ventricular ejection fraction is 55-65% by visual estimate.   1. Severe 2 vessel obstructive CAD. The RCA and LAD disease is stable compared to 2003. There is progressive disease in a large diagonal branch and the LCx    - the diagonal is a large bifurcating vessel.  It has multiple severe stenoses in the main branch with severely calcified lesions    - 75% proximal LCx and 90% mid LCx. There is an acutely angulated take off from the left main and the vessel is severely calcified. 2. Normal LV function 3. Normal LVEDP   Plan: discussed with Dr Burt Knack. The diagonal is poorly suited for PCI due to diffuse and heavily calcified disease. The LCx is complex and would require atherectomy. The acute angulation would make it technically difficult. For now would recommend aggressive medical therapy    Laboratory Data:  High Sensitivity Troponin:   Recent Labs  Lab 10/10/21 0126 10/10/21 0345  TROPONINIHS 55* 201*     Chemistry Recent Labs  Lab 10/10/21 0126 10/10/21 0345  NA 133*  --   K 3.9  --   CL 99  --   CO2 26  --   GLUCOSE 280*  --   BUN 56*  --   CREATININE 5.51*  --   CALCIUM 9.0  --   MG  --  2.0  GFRNONAA 11*  --   ANIONGAP 8  --      Hematology Recent Labs  Lab 10/10/21 0126  WBC 13.4*  RBC 3.66*  HGB 13.0  HCT 38.2*  MCV 104.4*  MCH 35.5*  MCHC 34.0  RDW 13.8  PLT 162    Radiology/Studies:  DG Chest 2 View  Result Date: 10/10/2021 CLINICAL DATA:  Central chest pain. EXAM: CHEST - 2 VIEW COMPARISON:  November 23, 2019 FINDINGS: There is a dual lead AICD. The heart size  and mediastinal contours  are within normal limits. The lungs are hyperinflated with mild, diffuse, chronic appearing increased lung markings. Mild atelectasis is seen within the right lung base. There is no evidence of a pleural effusion or pneumothorax. A spinal stimulator wire is seen with its distal tip at the approximate level of T8. The visualized skeletal structures are unremarkable. IMPRESSION: Chronic appearing increased lung markings with mild right basilar atelectasis. Electronically Signed   By: Virgina Norfolk M.D.   On: 10/10/2021 02:09     Assessment and Plan:   NSTEMI with ongoing chest pain and troponins 55, 201 with prior stent to the [place], NSTEMI 07/2019 diffuse severe two-vessel CAD 89 D percent large diagonal lesions 75% proximal circumflex and 90% mid circumflex difficult to pursue PCI recommend medical management. Cath done through right groin. Will add IV NTG, continue heparin and transfer to [facility] on medicine service. Will go straight to cath lab. I have reviewed the risks, indications, and alternatives to angioplasty and stenting with the patient. Risks include but are not limited to bleeding, infection, vascular injury, stroke, myocardial infection, arrhythmia, kidney injury, radiation-related injury in the case of prolonged fluoroscopy use, emergency cardiac surgery, and death. The patient understands the risks of serious complication is low (<8%) and patient agrees to proceed.    Symptomatic bradycardia status post pacemaker 2019  PAF/flutter low A-fib burden by pacer check Eliquis stopped 10/2018 due to anemia by Dr. 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    CHA2DS2-VASc Score = 2   This indicates a 2.2% annual risk of stroke. The patient's score is based upon: CHF History: 0 HTN History: 0 Diabetes History: 0 Stroke History: 0 Vascular Disease History: 1 Age Score: 1 Gender Score: 0     For questions or updates, please contact [facility] Please consult [email or web address] for contact info under    Signed, Ermalinda Barrios, PA-C  10/10/2021 8:23 AM    Attending note:  Patient seen and examined.  I reviewed his records and discussed the case with Ms. Bonnell Public PA-C.  Mr. Lamson presents to [facility] ER describing onset of chest tightness that began last night during routine peritoneal dialysis.  Did have some relief with nitroglycerin but symptoms have continued to wax and wane, remains with mild chest discomfort this morning.  Described as consistent with prior angina.  He has a known history of CAD with remote RCA stenting in 1999 and follow-up cardiac catheterization in 2021 showing severe two-vessel disease predominantly involving a large first diagonal and the circumflex.  Has significant calcification and acutely angulated takeoff of the circumflex with decision to treat medically in 2021.  He also has a history of PAF with low rhythm burden, CHA2DS2-VASc score of 2.  He was taken off anticoagulation by Dr. Rayann Heman in 2020 with history of anemia and GI bleed.  ESRD has been stable on peritoneal dialysis.  On examination he is in no distress.  Still complains of mild chest tightness.  He is afebrile, blood  pressure 151/95, heart rate in the 60s in sinus rhythm by telemetry which I personally reviewed.  Lungs are clear.  Cardiac exam with RRR and 2/6 systolic murmur.  Has a functioning AV fistula in the left upper extremity.  No peripheral edema.  Pertinent lab work includes potassium 3.9, BUN 56, creatinine  5.51, high-sensitivity troponin I of 55 up to 201 up to 545, hemoglobin 13.0, platelets 162.  Chest x-ray reports chronic appearing interstitial markings with right basilar atelectasis.  I personally reviewed his ECG which shows sinus rhythm with prolonged PR interval and nonspecific ST changes.  Patient presents with unstable angina, symptom onset yesterday evening and evidence of NSTEMI.  He has a history of multivessel disease as discussed above, managed medically in 2021 given significant calcification and acutely angulated takeoff of the circumflex from the left main.  Reports no significant angina on medical therapy until the last 24 hours.  He has been started on IV heparin, initiating IV nitroglycerin as well.  Plan to transfer for cardiac catheterization at [facility] and then admission thereafter for further management.  Risks and benefits discussed, and he is in agreement to proceed.  Of note, procedure in 2021 was done via the right femoral approach.  Satira Sark, M.D., F.A.C.C.

## 2021-10-10 NOTE — Assessment & Plan Note (Signed)
--   arrangements being made for transfer to [facility] for cath,  -- HS troponins are rising

## 2021-10-11 ENCOUNTER — Observation Stay (HOSPITAL_BASED_OUTPATIENT_CLINIC_OR_DEPARTMENT_OTHER): Payer: Medicare Other

## 2021-10-11 ENCOUNTER — Encounter (HOSPITAL_COMMUNITY): Payer: Self-pay | Admitting: Internal Medicine

## 2021-10-11 DIAGNOSIS — I214 Non-ST elevation (NSTEMI) myocardial infarction: Secondary | ICD-10-CM | POA: Diagnosis not present

## 2021-10-11 DIAGNOSIS — N186 End stage renal disease: Secondary | ICD-10-CM | POA: Diagnosis not present

## 2021-10-11 DIAGNOSIS — I2584 Coronary atherosclerosis due to calcified coronary lesion: Secondary | ICD-10-CM | POA: Diagnosis not present

## 2021-10-11 DIAGNOSIS — I2510 Atherosclerotic heart disease of native coronary artery without angina pectoris: Secondary | ICD-10-CM | POA: Diagnosis not present

## 2021-10-11 LAB — ECHOCARDIOGRAM COMPLETE
Area-P 1/2: 5.81 cm2
Calc EF: 58.8 %
Height: 76 in
MV VTI: 2.66 cm2
S' Lateral: 3.9 cm
Single Plane A2C EF: 59.2 %
Single Plane A4C EF: 60.4 %
Weight: 4225.78 oz

## 2021-10-11 LAB — COMPREHENSIVE METABOLIC PANEL
ALT: 20 U/L (ref 0–44)
AST: 21 U/L (ref 15–41)
Albumin: 2.9 g/dL — ABNORMAL LOW (ref 3.5–5.0)
Alkaline Phosphatase: 55 U/L (ref 38–126)
Anion gap: 11 (ref 5–15)
BUN: 62 mg/dL — ABNORMAL HIGH (ref 8–23)
CO2: 26 mmol/L (ref 22–32)
Calcium: 8.8 mg/dL — ABNORMAL LOW (ref 8.9–10.3)
Chloride: 98 mmol/L (ref 98–111)
Creatinine, Ser: 5.36 mg/dL — ABNORMAL HIGH (ref 0.61–1.24)
GFR, Estimated: 11 mL/min — ABNORMAL LOW (ref >60)
Glucose, Bld: 245 mg/dL — ABNORMAL HIGH (ref 70–99)
Potassium: 3.5 mmol/L (ref 3.5–5.1)
Sodium: 135 mmol/L (ref 135–145)
Total Bilirubin: 0.5 mg/dL (ref 0.3–1.2)
Total Protein: 5.7 g/dL — ABNORMAL LOW (ref 6.5–8.1)

## 2021-10-11 LAB — CBC
HCT: 35 % — ABNORMAL LOW (ref 39.0–52.0)
Hemoglobin: 12.1 g/dL — ABNORMAL LOW (ref 13.0–17.0)
MCH: 35.8 pg — ABNORMAL HIGH (ref 26.0–34.0)
MCHC: 34.6 g/dL (ref 30.0–36.0)
MCV: 103.6 fL — ABNORMAL HIGH (ref 80.0–100.0)
Platelets: 163 10*3/uL (ref 150–400)
RBC: 3.38 MIL/uL — ABNORMAL LOW (ref 4.22–5.81)
RDW: 14 % (ref 11.5–15.5)
WBC: 10.5 10*3/uL (ref 4.0–10.5)
nRBC: 0 % (ref 0.0–0.2)

## 2021-10-11 LAB — APTT: aPTT: 36 seconds (ref 24–36)

## 2021-10-11 LAB — GLUCOSE, CAPILLARY
Glucose-Capillary: 191 mg/dL — ABNORMAL HIGH (ref 70–99)
Glucose-Capillary: 154 mg/dL — ABNORMAL HIGH (ref 70–99)

## 2021-10-11 LAB — PHOSPHORUS: Phosphorus: 3.6 mg/dL (ref 2.5–4.6)

## 2021-10-11 MED ORDER — CLOPIDOGREL BISULFATE 75 MG PO TABS: 75.0000 mg | ORAL_TABLET | Freq: Every day | ORAL | 0 refills | Status: AC

## 2021-10-11 MED ORDER — INSULIN ASPART 100 UNIT/ML IJ SOLN: 0.0000 [IU] | Freq: Every day | INTRAMUSCULAR | Status: DC

## 2021-10-11 MED ORDER — PANTOPRAZOLE SODIUM 40 MG PO TBEC: 40.0000 mg | DELAYED_RELEASE_TABLET | Freq: Every day | ORAL | 0 refills | Status: DC

## 2021-10-11 MED ORDER — INSULIN ASPART 100 UNIT/ML IJ SOLN: 0.0000 [IU] | Freq: Three times a day (TID) | INTRAMUSCULAR | Status: DC

## 2021-10-11 MED FILL — Nitroglycerin IV Soln 100 MCG/ML in D5W: INTRA_ARTERIAL | Qty: 10 | Status: AC

## 2021-10-11 NOTE — Discharge Summary (Signed)
PatientPhysician Discharge Summary  [name] RJJ:[id number] DOB: 08-27-1950 DOA: 10/10/2021  PCP: Tobe Sos, MD  Admit date: 10/10/2021 Discharge date: 10/11/2021    Admitted From: Home Disposition: Home  Recommendations for Outpatient Follow-up:  Follow up with PCP in 1-2 weeks Please obtain BMP/CBC in one week Follow-up with cardiology as a scheduled Please follow up with your PCP on the following pending results: Unresulted Labs (From admission, onward)     Start     Ordered   10/11/21 0500  Renal function panel  Daily,   R      10/10/21 0738   10/11/21 0500  Lipoprotein A (LPA)  Tomorrow morning,   R        10/10/21 [place]: None Equipment/Devices: None  Discharge Condition: Stable CODE STATUS: Full code Diet recommendation: Cardiac  Subjective: Seen and examined.  No complaints.  He is eager to go home.  Brief/Interim Summary: 71 y.o. male with medical history significant of ESRD on peritoneal dialysis, COPD, CAD with history of NSTEMI, hypertension, CHF, type 2 diabetes mellitus who presented to the emergency department due to worsening mid sternal chest pain which started yesterday in the afternoon.  Patient states that pain initially started in the epigastric area and migrated to the chest, it was initially thought that this was due to too much fluid (since patient usually have peritoneal dialysis), so wife drained the dialysate, but the pain continued.  It was described as pressure-like and was rated as 8/10 on pain scale with no radiation.  Chest pain was alleviated by taking 2 nitroglycerin sublingual at home.  EMS was activated and patient was taken to the ED for further evaluation and management.    ED Course:  In the emergency department, he was hemodynamically stable.  BP on arrival was 112/43, and other vital signs were within normal range.  Work-up in the ED showed leukocytosis and elevated MCV, BMP showed 280,  BUN/creatinine 56/5.51 eGFR of 11.  Troponin x 2 - 55 > 201. Chest x-ray showed chronic appearing increased lung markings with mild right basilar atelectasis. Patient was started on heparin drip, nitroglycerin patch was placed.  Patient had significantly elevated troponin and he was diagnosed with NSTEMI and was transferred to [facility] for cardiac cath which he had done on 10/10/2021 which showed an occluded proximal left cicrumflex and subtotally occluded branch of a large bifurcating diagonal where were new (compared to previous study in 2021). Multiple attempts to cross the proximal left circumflex were unsuccessful. Multiple attempts to cross the subtotally occluded diagonal were also unsuccessful. Medical management was recommended by cardiology. Discussed cath findings with patient, he voiced understanding.  Following recommendations from cardiology. - Continue DAPT with ASA, plavix for at least 6 months  - Continue lipitor 40 mg daily (LDL 16 this admission, no need to increase statin)  - Continue carvedilol 25 mg BID  Patient is stable, very eager to go home, seen by cardiology, they have cleared him for discharge.   ESRD (end stage renal disease) [facility]) Nephrology was consulted, he received his PD here.   Chronic diastolic CHF (congestive heart failure) ([place]) -- currently compensated, echo pending.   Benign essential HTN Controlled.   Mixed hyperlipidemia Resume atorvastatin.  Discharge plan was discussed with patient and/or family member and they verbalized understanding and agreed with it.  Discharge Diagnoses:  Principal Problem:   NSTEMI (non-ST elevated myocardial  infarction) [facility]) Active Problems:   Mixed hyperlipidemia   Benign essential HTN   CAD (coronary artery disease)   Chronic diastolic CHF (congestive heart failure) (HCC)   Atypical chest pain   ESRD (end stage renal disease) (HCC)   Restless leg syndrome   Depression   Obesity (BMI  30-39.9)    Discharge Instructions  Discharge Instructions     Amb Referral to Cardiac Rehabilitation   Complete by: As directed    [place]   Diagnosis: NSTEMI   After initial evaluation and assessments completed: Virtual Based Care may be provided alone or in conjunction with Phase 2 Cardiac Rehab based on patient barriers.: Yes      Allergies as of 10/11/2021       Reactions   Hydrocodone Itching   Other    Opiods cause hallucinations and delusions has to be given risperidone to settle down Other reaction(s): hallucinations/delirium   Oxycodone-acetaminophen Other (See Comments)        Medication List     TAKE these medications    albuterol 108 (90 Base) MCG/ACT inhaler Commonly known as: VENTOLIN HFA Inhale 2 puffs into the lungs every 6 (six) hours as needed for wheezing or shortness of breath.   amLODipine 5 MG tablet Commonly known as: NORVASC Take 5 mg by mouth daily.   aspirin EC 81 MG tablet Take 1 tablet (81 mg total) by mouth daily.   atorvastatin 40 MG tablet Commonly known as: LIPITOR Take 1 tablet by mouth once daily   beclomethasone 80 MCG/ACT inhaler Commonly known as: QVAR Inhale 2 puffs into the lungs in the morning and at bedtime.   CALCIFEROL PO Take 0.25 mg by mouth daily.   CALCIUM CITRATE PO Take 600 mg by mouth in the morning and at bedtime.   carvedilol 25 MG tablet Commonly known as: COREG Take 1 tablet (25 mg total) by mouth 2 (two) times daily with a meal.   clobetasol 0.05 % external solution Commonly known as: TEMOVATE Apply 1 application topically 2 (two) times daily as needed (itching).   clopidogrel 75 MG tablet Commonly known as: PLAVIX Take 1 tablet (75 mg total) by mouth daily with breakfast. Start taking on: Oct 12, 2021   denosumab 60 MG/ML Sosy injection Commonly known as: PROLIA Inject 60 mg into the skin every 6 (six) months.   linaclotide 290 MCG Caps capsule Commonly known as: LINZESS Take 290 mcg  by mouth daily before breakfast.   magnesium oxide 400 MG tablet Commonly known as: MAG-OX Take 400 mg by mouth daily.   Melatonin 10 MG Tabs Take 20 mg by mouth at bedtime.   multivitamin Tabs tablet Take 1 tablet by mouth daily.   nitroGLYCERIN 0.4 MG SL tablet Commonly known as: NITROSTAT Place 0.4 mg under the tongue every 5 (five) minutes as needed for chest pain.   omeprazole 20 MG capsule Commonly known as: PRILOSEC Take 20 mg by mouth daily.   Potassium Chloride ER 20 MEQ Tbcr Take 20 mEq by mouth daily.   risperiDONE 0.5 MG tablet Commonly known as: RISPERDAL Take 0.5 mg by mouth at bedtime.   rOPINIRole 1 MG tablet Commonly known as: REQUIP Take 1 mg by mouth daily.   senna-docusate 8.6-50 MG tablet Commonly known as: Senokot-S Take 2 tablets by mouth at bedtime as needed for mild constipation.   sertraline 100 MG tablet Commonly known as: ZOLOFT Take 25 mg by mouth in the morning.   traMADol 50 MG tablet Commonly known  as: ULTRAM Take 50 mg by mouth every 4 (four) hours as needed for moderate pain.   Vitamin D3 50 MCG (2000 UT) Tabs Take 2,000 Units by mouth daily.        Follow-up Information     Pradhan, Tilden Fossa, MD Follow up in 1 week(s).   Specialty: Internal Medicine Contact information: [place] [address] [phone]         Arnoldo Lenis, MD .   Specialty: Cardiology Contact information: [address] [place] [address] [phone]         Thompson Grayer, MD .   Specialty: Cardiology Contact information: [place] [address] [phone]                Allergies  Allergen Reactions   Hydrocodone Itching   Other     Opiods cause hallucinations and delusions has to be given risperidone to settle down Other reaction(s): hallucinations/delirium   Oxycodone-Acetaminophen Other (See Comments)    Consultations: Cardiology   Procedures/Studies: DG Chest 2  View  Result Date: 10/10/2021 CLINICAL DATA:  Central chest pain. EXAM: CHEST - 2 VIEW COMPARISON:  November 23, 2019 FINDINGS: There is a dual lead AICD. The heart size and mediastinal contours are within normal limits. The lungs are hyperinflated with mild, diffuse, chronic appearing increased lung markings. Mild atelectasis is seen within the right lung base. There is no evidence of a pleural effusion or pneumothorax. A spinal stimulator wire is seen with its distal tip at the approximate level of T8. The visualized skeletal structures are unremarkable. IMPRESSION: Chronic appearing increased lung markings with mild right basilar atelectasis. Electronically Signed   By: Virgina Norfolk M.D.   On: 10/10/2021 02:09   CARDIAC CATHETERIZATION  Result Date: 10/10/2021   Mid LM to Dist LM lesion is 25% stenosed.   Mid LAD lesion is 40% stenosed.   Prox RCA lesion is 35% stenosed.   Dist RCA lesion is 50% stenosed.   1st Diag-3 lesion is 90% stenosed.   1st Diag-1 lesion is 80% stenosed.   1st Diag-2 lesion is 100% stenosed.   Ost Cx to Prox Cx lesion is 100% stenosed.   LV end diastolic pressure is mildly elevated.  Occluded proximal left circumflex and subtotally occluded branch of a large bifurcating diagonal which are new versus previous study from 2021.  Multiple attempts to cross the proximal left circumflex with different wires as well as a super cross catheter were unsuccessful.  Further attempts were deferred.  Multiple attempts to cross the subtotally occluded diagonal were also unsuccessful.  Medical management will be pursued. 2.  Moderate diffuse disease elsewhere largely unchanged versus study from 2021. 3.  LVEDP of 21 mmHg. Recommendation: Medical treatment of ACS with dual antiplatelet therapy for 6 months and continued optimal therapy for cardiovascular disease.     Discharge Exam: Vitals:   10/11/21 0715 10/11/21 0739  BP: (!) 157/67   Pulse: 62   Resp: 14   Temp: 97.8 F (36.6 C)    SpO2: 97% 93%   Vitals:   10/11/21 0434 10/11/21 0641 10/11/21 0715 10/11/21 0739  BP: 140/80  (!) 157/67   Pulse: 64  62   Resp: 18  14   Temp:   97.8 F (36.6 C)   TempSrc:   Temporal   SpO2: 97%  97% 93%  Weight:  119.2 kg 119.8 kg   Height:  General: Pt is alert, awake, not in acute distress Cardiovascular: RRR, S1/S2 +, no rubs, no gallops Respiratory: CTA bilaterally, no wheezing, no rhonchi Abdominal: Soft, NT, ND, bowel sounds + Extremities: no edema, no cyanosis    The results of significant diagnostics from this hospitalization (including imaging, microbiology, ancillary and laboratory) are listed below for reference.     Microbiology: No results found for this or any previous visit (from the past 240 hour(s)).   Labs: BNP (last 3 results) No results for input(s): BNP in the last 8760 hours. Basic Metabolic Panel: Recent Labs  Lab 10/10/21 0126 10/10/21 0345 10/11/21 0144  NA 133*  --  135  K 3.9  --  3.5  CL 99  --  98  CO2 26  --  26  GLUCOSE 280*  --  245*  BUN 56*  --  62*  CREATININE 5.51*  --  5.36*  CALCIUM 9.0  --  8.8*  MG  --  2.0  --   PHOS  --  2.6 3.6   Liver Function Tests: Recent Labs  Lab 10/11/21 0144  AST 21  ALT 20  ALKPHOS 55  BILITOT 0.5  PROT 5.7*  ALBUMIN 2.9*   No results for input(s): LIPASE, AMYLASE in the last 168 hours. No results for input(s): AMMONIA in the last 168 hours. CBC: Recent Labs  Lab 10/10/21 0126 10/11/21 0144  WBC 13.4* 10.5  HGB 13.0 12.1*  HCT 38.2* 35.0*  MCV 104.4* 103.6*  PLT 162 163   Cardiac Enzymes: No results for input(s): CKTOTAL, CKMB, CKMBINDEX, TROPONINI in the last 168 hours. BNP: Invalid input(s): POCBNP CBG: Recent Labs  Lab 10/10/21 0830 10/10/21 2108 10/10/21 2351 10/11/21 0451  GLUCAP 168* 285* 252* 191*   D-Dimer No results for input(s): DDIMER in the last 72 hours. Hgb A1c Recent Labs    10/10/21 0126  HGBA1C 5.7*   Lipid Profile No results  for input(s): CHOL, HDL, LDLCALC, TRIG, CHOLHDL, LDLDIRECT in the last 72 hours. Thyroid function studies No results for input(s): TSH, T4TOTAL, T3FREE, THYROIDAB in the last 72 hours.  Invalid input(s): FREET3 Anemia work up Recent Labs    10/10/21 0345  VITAMINB12 761  FOLATE 13.3   Urinalysis    Component Value Date/Time   COLORURINE YELLOW 07/22/2019 1625   APPEARANCEUR Hazy (A) 09/25/2020 1149   LABSPEC 1.010 07/22/2019 1625   PHURINE 6.0 07/22/2019 1625   GLUCOSEU Negative 09/25/2020 1149   HGBUR NEGATIVE 07/22/2019 1625   BILIRUBINUR Negative 09/25/2020 1149   KETONESUR NEGATIVE 07/22/2019 1625   PROTEINUR 2+ (A) 09/25/2020 1149   PROTEINUR >=300 (A) 07/22/2019 1625   UROBILINOGEN 0.2 09/27/2014 0027   NITRITE Negative 09/25/2020 1149   NITRITE NEGATIVE 07/22/2019 1625   LEUKOCYTESUR 1+ (A) 09/25/2020 1149   LEUKOCYTESUR NEGATIVE 07/22/2019 1625   Sepsis Labs Invalid input(s): PROCALCITONIN,  WBC,  LACTICIDVEN Microbiology No results found for this or any previous visit (from the past 240 hour(s)).   Time coordinating discharge: Over 30 minutes  SIGNED:   Darliss Cheney, MD  [facility] 10/11/2021, 11:08 AM *Please note that this is a verbal dictation therefore any spelling or grammatical errors are due to the "[place] [employer]" system interpretation. If 7PM-7AM, please contact night-coverage [email or web address]

## 2021-10-11 NOTE — Progress Notes (Signed)
  Echocardiogram 2D Echocardiogram has been performed.  [name] 10/11/2021, 12:12 PM

## 2021-10-11 NOTE — Progress Notes (Addendum)
Progress Note  Patient Name: [name] Date of Encounter: 10/11/2021  [facility] Cardiologist: Carlyle Dolly, MD   Subjective   Patient is feeling well this AM, denies any chest pain, sob, palpitations, dizziness. Denies any pain or bleeding at right radial cath site. Feels ready to go home, has an echocardiogram scheduled this AM   Inpatient Medications    Scheduled Meds:  amLODipine  5 mg Oral Daily   aspirin EC  81 mg Oral Daily   atorvastatin  40 mg Oral Daily   budesonide (PULMICORT) nebulizer solution  0.5 mg Nebulization BID   carvedilol  25 mg Oral BID WC   Chlorhexidine Gluconate Cloth  6 each Topical Q0600   clopidogrel  75 mg Oral Q breakfast   gentamicin cream  1 application. Topical Daily   pantoprazole  40 mg Oral Daily   rOPINIRole  1 mg Oral Daily   sertraline  100 mg Oral q AM   sodium chloride flush  3 mL Intravenous Q12H   sodium chloride flush  3 mL Intravenous Q12H   Continuous Infusions:  sodium chloride     dialysis solution 1.5% low-MG/low-CA     dialysis solution 2.5% low-MG/low-CA     PRN Meds: sodium chloride, acetaminophen, albuterol, nitroGLYCERIN, ondansetron (ZOFRAN) IV, sodium chloride flush, sodium chloride flush   Vital Signs    Vitals:   10/11/21 0434 10/11/21 0641 10/11/21 0715 10/11/21 0739  BP: 140/80  (!) 157/67   Pulse: 64  62   Resp: 18  14   Temp:   97.8 F (36.6 C)   TempSrc:   Temporal   SpO2: 97%  97% 93%  Weight:  119.2 kg 119.8 kg   Height:        Intake/Output Summary (Last 24 hours) at 10/11/2021 0803 Last data filed at 10/11/2021 0720 Gross per 24 hour  Intake 15222 ml  Output 17510 ml  Net -2288 ml      10/11/2021    7:15 AM 10/11/2021    6:41 AM 10/10/2021    6:15 PM  Last 3 Weights  Weight (lbs) 264 lb 1.8 oz 262 lb 12.6 oz 268 lb 15.4 oz  Weight (kg) 119.8 kg 119.2 kg 122 kg      Telemetry    Sinus rhythm, atrial paces rhythm, HR in the 60s - Personally Reviewed  ECG     Sinus  rhythm with 1st degree AV block, HR 63 BPM, nonspecific ST changes - Personally Reviewed  Physical Exam   GEN: No acute distress.  Sitting comfortably in the bed  Neck: No JVD Cardiac: RRR, grade 2.6 systolic murmur at LUSB. Radial pulses 2+ bilaterally.  Respiratory: Clear to auscultation bilaterally. GI: Soft, nontender, non-distended  MS: No edema; No deformity. Right radial cath site stable, without bleeding, tenderness, or swelling.  Neuro:  Nonfocal  Psych: Normal affect   Labs    High Sensitivity Troponin:   Recent Labs  Lab 10/10/21 0126 10/10/21 0345 10/10/21 0730  TROPONINIHS 55* 201* 545*     Chemistry Recent Labs  Lab 10/10/21 0126 10/10/21 0345 10/11/21 0144  NA 133*  --  135  K 3.9  --  3.5  CL 99  --  98  CO2 26  --  26  GLUCOSE 280*  --  245*  BUN 56*  --  62*  CREATININE 5.51*  --  5.36*  CALCIUM 9.0  --  8.8*  MG  --  2.0  --  PROT  --   --  5.7*  ALBUMIN  --   --  2.9*  AST  --   --  21  ALT  --   --  20  ALKPHOS  --   --  55  BILITOT  --   --  0.5  GFRNONAA 11*  --  11*  ANIONGAP 8  --  11    Lipids No results for input(s): CHOL, TRIG, HDL, LABVLDL, LDLCALC, CHOLHDL in the last 168 hours.  Hematology Recent Labs  Lab 10/10/21 0126 10/11/21 0144  WBC 13.4* 10.5  RBC 3.66* 3.38*  HGB 13.0 12.1*  HCT 38.2* 35.0*  MCV 104.4* 103.6*  MCH 35.5* 35.8*  MCHC 34.0 34.6  RDW 13.8 14.0  PLT 162 163   Thyroid No results for input(s): TSH, FREET4 in the last 168 hours.  BNPNo results for input(s): BNP, PROBNP in the last 168 hours.  DDimer No results for input(s): DDIMER in the last 168 hours.   Radiology    DG Chest 2 View  Result Date: 10/10/2021 CLINICAL DATA:  Central chest pain. EXAM: CHEST - 2 VIEW COMPARISON:  November 23, 2019 FINDINGS: There is a dual lead AICD. The heart size and mediastinal contours are within normal limits. The lungs are hyperinflated with mild, diffuse, chronic appearing increased lung markings. Mild atelectasis  is seen within the right lung base. There is no evidence of a pleural effusion or pneumothorax. A spinal stimulator wire is seen with its distal tip at the approximate level of T8. The visualized skeletal structures are unremarkable. IMPRESSION: Chronic appearing increased lung markings with mild right basilar atelectasis. Electronically Signed   By: Virgina Norfolk M.D.   On: 10/10/2021 02:09   CARDIAC CATHETERIZATION  Result Date: 10/10/2021   Mid LM to Dist LM lesion is 25% stenosed.   Mid LAD lesion is 40% stenosed.   Prox RCA lesion is 35% stenosed.   Dist RCA lesion is 50% stenosed.   1st Diag-3 lesion is 90% stenosed.   1st Diag-1 lesion is 80% stenosed.   1st Diag-2 lesion is 100% stenosed.   Ost Cx to Prox Cx lesion is 100% stenosed.   LV end diastolic pressure is mildly elevated.  Occluded proximal left circumflex and subtotally occluded branch of a large bifurcating diagonal which are new versus previous study from 2021.  Multiple attempts to cross the proximal left circumflex with different wires as well as a super cross catheter were unsuccessful.  Further attempts were deferred.  Multiple attempts to cross the subtotally occluded diagonal were also unsuccessful.  Medical management will be pursued. 2.  Moderate diffuse disease elsewhere largely unchanged versus study from 2021. 3.  LVEDP of 21 mmHg. Recommendation: Medical treatment of ACS with dual antiplatelet therapy for 6 months and continued optimal therapy for cardiovascular disease.    Cardiac Studies   Left Heart Cath 10/10/21   Mid LM to Dist LM lesion is 25% stenosed.   Mid LAD lesion is 40% stenosed.   Prox RCA lesion is 35% stenosed.   Dist RCA lesion is 50% stenosed.   1st Diag-3 lesion is 90% stenosed.   1st Diag-1 lesion is 80% stenosed.   1st Diag-2 lesion is 100% stenosed.   Ost Cx to Prox Cx lesion is 100% stenosed.   LV end diastolic pressure is mildly elevated.    Occluded proximal left circumflex and subtotally  occluded branch of a large bifurcating diagonal  which are new versus previous study  from 2021.  Multiple attempts to cross the proximal left circumflex with different wires as well as a super cross catheter were unsuccessful.  Further attempts were deferred.  Multiple attempts to cross the subtotally occluded diagonal were also unsuccessful.  Medical management will be pursued. 2.  Moderate diffuse disease elsewhere largely unchanged versus study from 2021. 3.  LVEDP of 21 mmHg.   Recommendation: Medical treatment of ACS with dual antiplatelet therapy for 6 months and continued optimal therapy for cardiovascular disease. Diagnostic Dominance: Right    Patient Profile     71 y.o. male with a  history of CAD who was seen on 5/24 at [facility] for the evaluation of NSTEMI. Patient was transferred to [facility] and underwent LHC on 10/10/21  Assessment & Plan    NSTEMI  CAD  HLD  - Patient presented to [facility] complaining of chest tightness that occurred during dialysis the day prior, unrelieved with nitroglycerin  - hsTn 55>>201>>545  - Patient was transported to [facility] and underwent cardiac catheterization on 5/24-- showed an occluded proximal left cicrumflex and subtotally occluded branch of a large bifurcating diagonal where were new (compared to previous study in 2021)  - Multiple attempts to cross the proximal left circumflex were unsuccessful. Multiple attempts to cross the subtotally occluded diagonal were also unsuccessful. Medical management will be pursued. Discussed cath findings with patient, he voiced understanding.   - Continue DAPT with ASA, plavix for at least 6 months  - Continue lipitor 40 mg daily (LDL 16 this admission, no need to increase statin)  - Continue carvedilol 25 mg BID   Chronic diastolic CHF - Patient was noted to have a mildly elevated LVEDP on cath yesterday  - Volume managed with PD, appreciated nephrology  consultation  - Patient denies any sob, ankle swelling  - Continue carvedilol 25 mg daily   Symptomatic Bradycardia  S/P PPM in 2019  History of PAF: low a-fib burden by pacer check  - Per telemetry, patient is maintaining sinus rhythm/atrial paced rhythm with HR in the 60s   Otherwise per primary  - ESRD on peritoneal hemodialysis  - Restless leg syndrome - Anemia, elevated MCV - Type 2 DM  - COPD  - Depression       For questions or updates, please contact [facility] Please consult [email or web address] for contact info under        Signed, Margie Billet, PA-C  10/11/2021, 8:03 AM    Personally seen and examined. Agree with above.  70 on hemodialysis with non-ST elevation myocardial infarction.  Troponin peaked at 545. Occluded proximal left circumflex and subtotal occluded branch of large bifurcating diagonal were new compared to prior study in 2021.  There were attempts made to cross the proximal left circumflex but they were unsuccessful.  Diagonal also unsuccessful.  Continuing with medical management.  No stents were placed.   He will be on dual antiplatelet therapy for 6 months.  Watch for any signs of bleeding.  Continue aspirin lifelong.    Atorvastatin 40 mg a day with LDL of 16 on this admission.  Excellent.    Continue with beta-blocker as well at optimal dosing.  Volume management per HD.    Pacemaker in place from 2019.  AV pacing noted on telemetry.  Low atrial fibrillation burden from previous pacemaker interrogation.  He is maintaining sinus rhythm.  I am comfortable with his discharge later today after echocardiogram performed.  Candee Furbish, MD

## 2021-10-11 NOTE — Progress Notes (Signed)
CARDIAC REHAB PHASE I   PRE:  Rate/Rhythm: 64 NSR  BP:  Sitting: 136/55      SaO2: 99  MODE:  Ambulation: 120 ft   POST:  Rate/Rhythm: 71  BP:  Sitting: 173/86      SaO2: 99  Seen pt from 0920-1011, pt is eager to be d/c. Pt was ambulated through hallways with RW and light assistance from gait belt. Pt had slow, steady gait and became SOB while ambulating. He denied angina during ambulation and when he returned to room When pt returned to room he received MI book, angina, NTG use, plavix and aspirin med use, exercise, CRPII. Encouraged pt to meet with dietician. Pt will be referred to [facility] in [place].   Christen Bame  9:56 AM 10/11/2021

## 2021-10-11 NOTE — Progress Notes (Signed)
[name] Progress Note   71 y.o. male DM, COPD, CASHD w/ h/o NSTEMI +  PCI,  CHF, ESRD on PD; he also has a patent lt BCF.  He lives in [place]- follows with Candiss Norse with [facility].  Wife is a former [employer] scientist and takes care of his PD needs at home.  He p/w  chest pain central and yesterday after the 5th fill described as pressure like and clamping. In the ED troponin increased from 55 -> 201 and patient was started on heparin gtt, nitroglycerin patch. Patient was transferred to [facility] for a cardiac cath for NSTEMI.  Assessment/ Plan:   1.Renal- ESRD on PD.  Usually with 3L fill 4 exchanges at night and then 5th fill with Dianeal till following night. He usually alternates 1.5/2.5 one day with only 1.5 following day and has done very well with this regimen.   Seen on PD  No Icodextrin -> performed 5 exchanges alternating 1.5/2.5 with net 878 mL. Should be able to use 1.5 tonight when he goes home  but will need the 5th fill with Icodextrin.   2. NSTEMI - s/p cath. Unfortunately unable to cross the lesions -> medical therapy. 3. Hypertension/volume  - does not seem overloaded; fairly euvolemic.   4. Anemia  - not an issue right now 5. Restless leg syndrome - on ropinirole 6. HFpEF - compensated.  Subjective:   No further chest pain; denies f/c/n// sob   Objective:   BP (!) 157/67 (BP Location: Right Wrist)   Pulse 62   Temp 97.8 F (36.6 C) (Temporal)   Resp 14   Ht '6\' 4"'$  (1.93 m)   Wt 119.8 kg   SpO2 93%   BMI 32.15 kg/m   Intake/Output Summary (Last 24 hours) at 10/11/2021 0959 Last data filed at 10/11/2021 0900 Gross per 24 hour  Intake 15582 ml  Output 17160 ml  Net -1578 ml   Weight change: 8.601 kg . Physical Exam: General: alert, very pleasant, NAD Neck: no JVD Heart: RRR Lungs: clear b/l Abdomen: PD cath in right lower quad-  No tunnel tenderness-  Soft, no tenderness/ rebound or guarding Extremities: no edema Skin: warm and dry Neuro: alert and oriented  x3 and appropriate  Left upper arm BCF-  Patent with good bruit   Imaging: DG Chest 2 View  Result Date: 10/10/2021 CLINICAL DATA:  Central chest pain. EXAM: CHEST - 2 VIEW COMPARISON:  November 23, 2019 FINDINGS: There is a dual lead AICD. The heart size and mediastinal contours are within normal limits. The lungs are hyperinflated with mild, diffuse, chronic appearing increased lung markings. Mild atelectasis is seen within the right lung base. There is no evidence of a pleural effusion or pneumothorax. A spinal stimulator wire is seen with its distal tip at the approximate level of T8. The visualized skeletal structures are unremarkable. IMPRESSION: Chronic appearing increased lung markings with mild right basilar atelectasis. Electronically Signed   By: Virgina Norfolk M.D.   On: 10/10/2021 02:09   CARDIAC CATHETERIZATION  Result Date: 10/10/2021   Mid LM to Dist LM lesion is 25% stenosed.   Mid LAD lesion is 40% stenosed.   Prox RCA lesion is 35% stenosed.   Dist RCA lesion is 50% stenosed.   1st Diag-3 lesion is 90% stenosed.   1st Diag-1 lesion is 80% stenosed.   1st Diag-2 lesion is 100% stenosed.   Ost Cx to Prox Cx lesion is 100% stenosed.   LV end diastolic pressure is  mildly elevated.  Occluded proximal left circumflex and subtotally occluded branch of a large bifurcating diagonal which are new versus previous study from 2021.  Multiple attempts to cross the proximal left circumflex with different wires as well as a super cross catheter were unsuccessful.  Further attempts were deferred.  Multiple attempts to cross the subtotally occluded diagonal were also unsuccessful.  Medical management will be pursued. 2.  Moderate diffuse disease elsewhere largely unchanged versus study from 2021. 3.  LVEDP of 21 mmHg. Recommendation: Medical treatment of ACS with dual antiplatelet therapy for 6 months and continued optimal therapy for cardiovascular disease.    Labs: BMET Recent Labs  Lab  10/10/21 0126 10/10/21 0345 10/11/21 0144  NA 133*  --  135  K 3.9  --  3.5  CL 99  --  98  CO2 26  --  26  GLUCOSE 280*  --  245*  BUN 56*  --  62*  CREATININE 5.51*  --  5.36*  CALCIUM 9.0  --  8.8*  PHOS  --  2.6 3.6   CBC Recent Labs  Lab 10/10/21 0126 10/11/21 0144  WBC 13.4* 10.5  HGB 13.0 12.1*  HCT 38.2* 35.0*  MCV 104.4* 103.6*  PLT 162 163    Medications:     amLODipine  5 mg Oral Daily   aspirin EC  81 mg Oral Daily   atorvastatin  40 mg Oral Daily   budesonide (PULMICORT) nebulizer solution  0.5 mg Nebulization BID   carvedilol  25 mg Oral BID WC   Chlorhexidine Gluconate Cloth  6 each Topical Q0600   clopidogrel  75 mg Oral Q breakfast   gentamicin cream  1 application. Topical Daily   insulin aspart  0-5 Units Subcutaneous QHS   insulin aspart  0-9 Units Subcutaneous TID WC   pantoprazole  40 mg Oral Daily   rOPINIRole  1 mg Oral Daily   sertraline  100 mg Oral q AM   sodium chloride flush  3 mL Intravenous Q12H   sodium chloride flush  3 mL Intravenous Q12H      Otelia Santee, MD 10/11/2021, 9:59 AM

## 2021-10-12 LAB — LIPOPROTEIN A (LPA): Lipoprotein (a): 13.2 nmol/L (ref <75.0)

## 2021-10-19 ENCOUNTER — Telehealth (HOSPITAL_COMMUNITY): Payer: Self-pay

## 2021-10-19 ENCOUNTER — Encounter: Payer: Medicare Other | Admitting: Internal Medicine

## 2021-10-19 NOTE — Telephone Encounter (Signed)
Outside referral faxed to [facility].

## 2021-10-22 ENCOUNTER — Encounter: Payer: Self-pay | Admitting: Cardiology

## 2021-10-22 ENCOUNTER — Ambulatory Visit (INDEPENDENT_AMBULATORY_CARE_PROVIDER_SITE_OTHER): Payer: Medicare Other | Admitting: Cardiology

## 2021-10-22 VITALS — BP 134/64 | HR 60 | Ht 76.0 in | Wt 262.8 lb

## 2021-10-22 DIAGNOSIS — I6523 Occlusion and stenosis of bilateral carotid arteries: Secondary | ICD-10-CM

## 2021-10-22 DIAGNOSIS — I480 Paroxysmal atrial fibrillation: Secondary | ICD-10-CM | POA: Diagnosis not present

## 2021-10-22 DIAGNOSIS — I2510 Atherosclerotic heart disease of native coronary artery without angina pectoris: Secondary | ICD-10-CM

## 2021-10-22 DIAGNOSIS — I495 Sick sinus syndrome: Secondary | ICD-10-CM | POA: Diagnosis not present

## 2021-10-22 MED ORDER — NITROGLYCERIN 0.4 MG SL SUBL: 0.4000 mg | SUBLINGUAL_TABLET | SUBLINGUAL | 6 refills | Status: DC | PRN

## 2021-10-22 MED ORDER — AMLODIPINE BESYLATE 10 MG PO TABS: 10.0000 mg | ORAL_TABLET | Freq: Every day | ORAL | 1 refills | Status: DC

## 2021-10-22 NOTE — Patient Instructions (Addendum)
Medication Instructions:  Your physician has recommended you make the following change in your medication:  Increase amlodipine to 10 mg once a day Continue all other medications as directed  Labwork: none  Testing/Procedures: Your physician has requested that you have a carotid duplex. This test is an ultrasound of the carotid arteries in your neck. It looks at blood flow through these arteries that supply the brain with blood. Allow one hour for this exam. There are no restrictions or special instructions.   Follow-Up: Your physician recommends that you schedule a follow-up appointment in: 6 months  Any Other Special Instructions Will Be Listed Below (If Applicable).  If you need a refill on your cardiac medications before your next appointment, please call your pharmacy.

## 2021-10-22 NOTE — Progress Notes (Signed)
Clinical Summary Mr. Schepers is a 71 y.o.male seen today for follow up of the following medical problems.      1. Aflutter/Afib - low afib burden by pacemaker check   - no recent palpitations. No bleeding on eliquis.     - eliquis stopped 10/2018 due to anemia by Dr Rayann Heman   - issues with GI bleeding 10/2018, heme +stools, iron deficiency. Colonscopy with nonbleeding angiodysplastic lesion, small polyps. EGD polyp no signs of bleeding.       - previosuyl discussed watchman device with Dr Rayann Heman  - 11/2020 evaluated by Dr Quentin Ore, thought poor watchman candidate due to chronic medical conditions and ESRD     - denies any recent palpitations.      2. Symptomatic bradycardia - s/p pacemaker placement following episode of syncope - admit 09/28/17 to [place], [place] hospital with dizziness and syncope. - followed by EP. Chronically elevated RV lead threshold but stable     - no symptoms, recent normal device check     3. OSA screen - very mild OSA by 11/2017 study,did have sleep hypoxemia     4. Carotid stenosis - noted during recent admission for syncope.  -09/2017 carotid US in [place] with  RICA <90%, LICA 24-09%   11/3530 carotid US: LICA 99-24%, RICA plaque without stenosis. (PA Lenze referred to vascular)  08/2020 carotid US [place]: 1-49% bilateral ICA disease.    5. HTN - he is compliant with meds - prior issues with low bp's on HD, no recent issues now on peritoneal HD  - home bp's usually 130s-150s/70s.    6. CAD   - prior stent to RCA in 1999. cath 05/2001 : LM 20-30%, LAD 50-60% And 60-70% mid, LCX with mid AV portion 60-70%, RCA with patent stent with proximal 40-50% disease. LVEF by LVgram 65%, LVEDP 20. Overall moderate lesions, not great anatomically for PCI per notes, treated medically.   - 10/2014 Lexiscan with small area of ischemia mid inferior to apical, overall low risk  - Jan 2019 nuclear stress no ischemia    - admit 07/2019 with NSTEMI (peak trop  229), cath as reported below. Overall complex anatomy difficult to pursue with PCI, recs for medical management.  He only develops symptoms postdialysis when his blood pressure is low.   - imdur lowered to '30mg'$  due to headaches.  - started on ranexa '500mg'$  bid by PA Lenze at 3/22 visit - he reports HD has increased his dry weight with some improvement.  - pain diffuse midabdomen, typically grabbing like pain. +SOB, +hot. Can last 10 minutes up to 2 hours.  - some mild chest discomfort with exertion.    - insurance would not cover ranexa     10/2020 nuclear stress: large inferior infarct, mild to mod apical ischemia 10/2020 echo LVEF 60-65%, grade I dd  -admit 09/2021 with NSTEMI - 09/2021 cath: distal LM 25%, mid LAD 40%, D1 80%, D2 100%, D3 90%, ostial LCX occluded, RCA prox 35% and distal 50%. Could not cross LCX or diag with wire. Recs for medical therapy - 09/2021 echo: LVEF 55-60% - no recurrent chest pains. - compliant wit meds - limited rehab ability given chronic back pain.        7. Chronic diastolic HF -26/8341  echo LVEF 60-65%, grade II diastoilc dysfunction -11/2018 echo LVEF 60-65%, grade I dd, normal RV   - some recent weight gain calorie related, no LE edema, SOB, or DOE  - no SOB/DOE.  8. ESRD - followed by renal  - on peritoneal HD, just finished training. Tolerating peritoneal HD - no recurrent chest pain.  -    - home bp's 120s/70-80s on peritoneal dialysis.        6. Hyperlipidemia   - compliant with statin   07/2019 TC 93 TG 95 HDL 38 LDL 36 09/2020 TC 63 TG 65 HDL 35 LDL 16     7. COPD - followed by pcp     8. AAA 06/2016 CT A/P 3.4 cm infrerenal AAA - 07/2019 mesenteric [place] AAA 3.3 x 3.6 cm 07/2019 CT A/P 3.7 AAA, rece [place] 2 years   09/2020 Advanced aortic and [place] atherosclerosis. Infrarenal aortic aneurysm with maximal dimension 3.7 cm - repeat study 2024      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    10 .  Chronic back pain - 12/2020 had back stimulator implant - has had some improvement since procedure.     Past Medical History:  Diagnosis Date   AAA (abdominal aortic aneurysm) ([place]) 06/2016   Mild increase in size of 3.4 cm infrarenal abdominal aortic aneurysm   Anemia    Aortic atherosclerosis (HCC)    Arthritis    Asthma    Atrial flutter (HCC)    AVF (arteriovenous fistula) (HCC)    Left forearm   CAD (coronary artery disease)    a. s/p prior stenting of RCA in 1999 b. cath in 2003 showing moderate CAD c. NST in 2016 showing small area of ischemic and low-risk   CHF (congestive heart failure) ([place])    CKD (chronic kidney disease), stage V ([place])    kidney transplant evaluation   COPD (chronic obstructive pulmonary disease) ([place])    with ongoing tobacco use and patient failed sham takes   Diverticulosis 2018   Mild sigmoid colon diverticulosis.   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    Reflux esophagitis    hx   Two-vessel coronary artery disease    moderate. by cath in 2003. Status post stenting of the mdi RCA November 1999 normal left ventricular ejection fraction   Wears glasses    Wears hearing aid    B/L     Allergies  Allergen Reactions   Hydrocodone Itching   Other     Opiods cause hallucinations and delusions has to be given risperidone to settle down Other reaction(s): hallucinations/delirium   Oxycodone-Acetaminophen Other (See Comments)      Current Outpatient Medications  Medication Sig Dispense Refill   albuterol (VENTOLIN HFA) 108 (90 Base) MCG/ACT inhaler Inhale 2 puffs into the lungs every 6 (six) hours as needed for wheezing or shortness of breath.      amLODipine (NORVASC) 5 MG tablet Take 5 mg by mouth daily.     aspirin EC 81 MG EC tablet Take 1 tablet (81 mg  total) by mouth daily.     atorvastatin (LIPITOR) 40 MG tablet Take 1 tablet by mouth once daily (Patient taking differently: Take 40 mg by mouth daily.) 90 tablet 3   beclomethasone (QVAR) 80 MCG/ACT inhaler Inhale 2 puffs into the lungs in the morning and at bedtime.     CALCIUM CITRATE PO Take 600 mg by mouth in the morning and at bedtime.     carvedilol (COREG) 25 MG tablet Take 1 tablet (25 mg total) by mouth 2 (two) times daily with a meal. 180 tablet 3   Cholecalciferol (VITAMIN D3) 2000 units TABS Take 2,000 Units by mouth daily.      clobetasol (TEMOVATE) 0.05 % external solution Apply 1 application topically 2 (two) times daily as needed (itching).     clopidogrel (PLAVIX) 75 MG tablet Take 1 tablet (75 mg total) by mouth daily with breakfast. 30 tablet 0   denosumab (PROLIA) 60 MG/ML SOSY injection Inject 60 mg into the skin every 6 (six) months.     Ergocalciferol (CALCIFEROL PO) Take 0.25 mg by mouth daily.     linaclotide (LINZESS) 290 MCG CAPS capsule Take 290 mcg by mouth daily before breakfast.     magnesium oxide (MAG-OX) 400 MG tablet Take 400 mg by mouth daily.     Melatonin 10 MG TABS Take 20 mg by mouth at bedtime.     multivitamin (RENA-VIT) TABS tablet Take 1 tablet by mouth daily.     nitroGLYCERIN (NITROSTAT) 0.4 MG SL tablet Place 0.4 mg under the tongue every 5 (five) minutes as needed for chest pain.     pantoprazole (PROTONIX) 40 MG tablet Take 1 tablet (40 mg total) by mouth daily. 90 tablet 0   Potassium Chloride ER 20 MEQ TBCR Take 20 mEq by mouth daily.     risperiDONE (RISPERDAL) 0.5 MG tablet Take 0.5 mg by mouth at bedtime.      rOPINIRole (REQUIP) 1 MG tablet Take 1 mg by mouth daily.     senna-docusate (SENOKOT-S) 8.6-50 MG tablet Take 2 tablets by mouth at bedtime as needed for mild constipation.     sertraline (ZOLOFT) 100 MG tablet Take 25 mg by mouth in the morning.     traMADol (ULTRAM) 50 MG tablet Take 50 mg by mouth every 4 (four) hours as needed for moderate pain.     No current facility-administered medications for this visit.     Past Surgical History:  Procedure Laterality Date   A/V FISTULAGRAM Left 04/30/2019   Procedure: A/V FISTULAGRAM;  Surgeon: Elam Dutch, MD;  Location: [facility];  Service: Cardiovascular;  Laterality: Left;   A/V FISTULAGRAM Left 09/22/2019   Procedure: A/V FISTULAGRAM;  Surgeon: Marty Heck, MD;  Location: [facility];  Service: Cardiovascular;  Laterality: Left;   ABDOMINAL SURGERY     APPENDECTOMY     AV FISTULA PLACEMENT Right 03/11/2018   Procedure: CREATION Brachiocephalic Fistula RIGHT ARM;  Surgeon: Rosetta Posner, MD;  Location: [facility];  Service: Vascular;  Laterality: Right;   AV FISTULA PLACEMENT Left 05/06/2019   Procedure: LEFT BRACHIOCEPHALIC ARTERIOVENOUS (AV) FISTULA CREATION;  Surgeon: Marty Heck, MD;  Location: [facility];  Service: Vascular;  Laterality: Left;   BACK SURGERY     x3; cages in patient's back since 2000   BIOPSY  11/12/2018   Procedure: BIOPSY;  Surgeon: Laurence Spates, MD;  Location: [facility];  Service: Endoscopy;;   CARDIAC CATHETERIZATION  stent   CHOLECYSTECTOMY     CLOSED REDUCTION SHOULDER DISLOCATION     COLONOSCOPY W/ BIOPSIES AND POLYPECTOMY     COLONOSCOPY WITH PROPOFOL N/A 11/12/2018   Procedure: COLONOSCOPY WITH PROPOFOL;  Surgeon: Laurence Spates, MD;  [facility];  Service: Endoscopy;  Laterality: N/A;   COLONOSCOPY WITH PROPOFOL N/A 10/02/2021   Procedure: COLONOSCOPY WITH PROPOFOL;  Surgeon: Clarene Essex, MD;  [facility];  Service: Gastroenterology;   Laterality: N/A;   CORONARY ANGIOPLASTY     CYSTOSCOPY WITH INSERTION OF UROLIFT N/A 11/06/2020   Procedure: CYSTOSCOPY WITH INSERTION OF UROLIFT;  Surgeon: Cleon Gustin, MD;  Location: [facility];  Service: Urology;  Laterality: N/A;   diabetic ulcers     DIAGNOSTIC LAPAROSCOPY     DIALYSIS/PERMA CATHETER REMOVAL N/A 09/22/2019   Procedure: DIALYSIS/PERMA CATHETER REMOVAL;  Surgeon: Marty Heck, MD;  Location: [facility];  Service: Cardiovascular;  Laterality: N/A;   ESOPHAGOGASTRODUODENOSCOPY (EGD) WITH PROPOFOL N/A 11/12/2018   Procedure: ESOPHAGOGASTRODUODENOSCOPY (EGD) WITH PROPOFOL;  Surgeon: Laurence Spates, MD;  [facility];  Service: Endoscopy;  Laterality: N/A;   GROIN EXPLORATION     HEMOSTASIS CLIP PLACEMENT  11/12/2018   Procedure: HEMOSTASIS CLIP PLACEMENT;  Surgeon: Laurence Spates, MD;  [facility];  Service: Endoscopy;;   HIP ARTHROPLASTY Left    gamma nail   INSERT / REPLACE / Elias-Fela Solis (AV) ARTEGRAFT ARM Left 03/18/2018   Procedure: INSERTION OF ARTERIOVENOUS (AV) FISTULA;  Surgeon: Rosetta Posner, MD;  Location: [facility];  Service: Vascular;  Laterality: Left;   IR FLUORO GUIDE CV LINE RIGHT  04/11/2019   IR US GUIDE VASC ACCESS RIGHT  04/11/2019   LEFT HEART CATH AND CORONARY ANGIOGRAPHY N/A 07/23/2019   Procedure: LEFT HEART CATH AND CORONARY ANGIOGRAPHY;  Surgeon: Martinique, Peter M, MD;  Location: [facility];  Service: Cardiovascular;  Laterality: N/A;   LEFT HEART CATH AND CORONARY ANGIOGRAPHY N/A 10/10/2021   Procedure: LEFT HEART CATH AND CORONARY ANGIOGRAPHY;  Surgeon: [name], MD;  Location: [facility];  Service: Cardiovascular;  Laterality: N/A;   LIGATION ARTERIOVENOUS GORTEX GRAFT Right 03/18/2018   Procedure: LIGATION ARTERIOVENOUS FISTULA;  Surgeon: Rosetta Posner, MD;  [facility];  Service: Vascular;  Laterality: Right;   OSTECTOMY Left 04/23/2017    Procedure: OSTECTOMY LEFT GREAT TOE;  Surgeon: [name], DPM;  Location: [facility];  Service: Podiatry;  Laterality: Left;  left great toe   POLYPECTOMY  11/12/2018   Procedure: POLYPECTOMY;  Surgeon: Laurence Spates, MD;  [facility];  Service: Endoscopy;;   POLYPECTOMY  10/02/2021   Procedure: POLYPECTOMY;  Surgeon: Clarene Essex, MD;  Location: [facility];  Service: Gastroenterology;;   SPINAL CORD STIMULATOR INSERTION N/A 12/20/2020   Procedure: Permanent Spinal Cord Stimulator  Lead Implant with Fluoroscopy and Battery Implant;  Surgeon: Reece Agar, MD;  Location: [facility];  Service: Neurosurgery;  Laterality: N/A;   TUMOR REMOVAL     small intestine x3   VISCERAL ANGIOGRAPHY N/A 08/02/2019   Procedure: VISCERAL ANGIOGRAPHY;  Surgeon: Waynetta Sandy, MD;  Location: [facility];  Service: Cardiovascular;  Laterality: N/A;     Allergies  Allergen Reactions   Hydrocodone Itching   Other     Opiods cause hallucinations and delusions has to be given risperidone to settle down Other reaction(s): hallucinations/delirium   Oxycodone-Acetaminophen Other (See Comments)  Family History  Problem Relation Age of Onset   Diabetes Mother    Alcohol abuse Father      Social History Mr. Kubicek reports that he quit smoking about 22 months ago. His smoking use included cigarettes. He started smoking about 53 years ago. He has never used smokeless tobacco. Mr. Mah reports that he does not currently use alcohol.   Review of Systems CONSTITUTIONAL: No weight loss, fever, chills, weakness or fatigue.  HEENT: Eyes: No visual loss, blurred vision, double vision or yellow sclerae.No hearing loss, sneezing, congestion, runny nose or sore throat.  SKIN: No rash or itching.  CARDIOVASCULAR: per hpi RESPIRATORY: No shortness of breath, cough or sputum.  GASTROINTESTINAL: No anorexia, nausea, vomiting or diarrhea. No abdominal pain or blood.  GENITOURINARY:  No burning on urination, no polyuria NEUROLOGICAL: No headache, dizziness, syncope, paralysis, ataxia, numbness or tingling in the extremities. No change in bowel or bladder control.  MUSCULOSKELETAL: No muscle, back pain, joint pain or stiffness.  LYMPHATICS: No enlarged nodes. No history of splenectomy.  PSYCHIATRIC: No history of depression or anxiety.  ENDOCRINOLOGIC: No reports of sweating, cold or heat intolerance. No polyuria or polydipsia.  Marland Kitchen   Physical Examination Today's Vitals   10/22/21 1254  BP: 134/64  Pulse: 60  SpO2: 97%  Weight: 262 lb 12.8 oz (119.2 kg)  Height: '6\' 4"'$  (1.93 m)   Body mass index is 31.99 kg/m.  Gen: resting comfortably, no acute distress HEENT: no scleral icterus, pupils equal round and reactive, no palptable cervical adenopathy,  CV: RRR, no m/r/g, no jvd Resp: Clear to auscultation bilaterally GI: abdomen is soft, non-tender, non-distended, normal bowel sounds, no hepatosplenomegaly MSK: extremities are warm, no edema.  Skin: warm, no rash Neuro:  no focal deficits Psych: appropriate affect   Diagnostic Studies  05/2001 Cath   FINDINGS:   1. Left main trunk: Medium caliber vessel. This is a long vessel with a   distal taper of 20-30%.   2. Left anterior descending artery: This is a medium caliber vessel that   provides a large bifurcating first diagonal Shawntelle Ungar in the proximal segment   and before then extending to the apex, the LAD tapers significantly after   the diagonal Claus Silvestro. There is moderate disease of 50-60% after the   diagonal Rosela Supak and then a focal eccentric narrowing of 60-70% in the mid   section of the LAD. The diagonal May Ozment has moderate disease of 30-40% in   the proximal segment. The lateral division is a small caliber vessel with   an ostial narrowing of 60%.   3. Left circumflex artery: This is a small caliber vessel that provides a   trivial first marginal Brennen Gardiner in the mid section and a medium caliber   second  marginal Shavawn Stobaugh distally. The mid AV circumflex at the takeoff of   the first and second diagonal branches had moderate diffuse disease of   60-70%.   4. Right coronary artery: Dominant. This is a large caliber vessel that   provides a posterior descending artery and two posteroventricular branches   in the terminal segment. The right coronary artery has evidence of an   existing stent in the mid section that is widely patent. Prior to stent is   a focal discrete narrowing of 40-50%. Distal to the stent is moderate   disease of 30%. The distal Donae Kueker vessels also have mild disease of 30%.   5. Left ventricle: Normal end-systolic and end-diastolic dimensions. Overall  left ventricular function is well preserved. Ejection fraction is greater   than 65%. No mitral regurgitation. LV pressure is 180/10, aortic is   180/90. LV EDP equals 20.   ASSESSMENT AND PLAN: Mr. Dougal is a 71 year old gentleman with moderate   three-vessel coronary artery disease. The patients plaque burden in the left   anterior descending artery and circumflex appears to have increased somewhat   compared to his previous angiogram. Unfortunately, the mid and distal left   anterior descending artery is a small caliber vessel with a long diffuse   segment of disease and would be associated with a high restenosis rate.   Similarly, the circumflex would suffer the same fate.   Further assessment with a stress imaging study would be prudent to isolate the   area of ischemia. Percutaneous intervention may then be targeted if   indicated. In addition, aggressive medical therapy should be pursued as the   patient currently is not on any anti-anginal therapy and has poorly controlled   hypertension. The patient will also be counseled regarding smoking cessation.      07/2013 PFTs   Mild obstruction       10/2014 Lexiscan   Unable to adequately ambulate to treadmill due to right hip pain. Lexiscan employed.   Defect 1: There  is a small defect of mild severity present in the mid inferior and apical inferior location. This defect is partially reversible.   This is a low risk study.   Nuclear stress EF: 58%.   Small region of apical inferior ischemia      07/2015 Echo Study Conclusions   - Left ventricle: The cavity size was normal. Wall thickness was   increased increased in a pattern of mild to moderate LVH.   Systolic function was normal. The estimated ejection fraction was   in the range of 60% to 65%. Diastolic function is abnormal,   indeterminate grade. Wall motion was normal; there were no   regional wall motion abnormalities. - Aortic valve: Mildly calcified annulus. Trileaflet; mildly   thickened leaflets. Valve area (VTI): 2.39 cm^2. Valve area   (Vmax): 2.58 cm^2. Valve area (Vmean): 2.7 cm^2. - Mitral valve: Mildly calcified annulus. Normal thickness leaflets   . - Technically adequate study.   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 Recommend followup by ultrasound in 2 years. This recommendation follows ACR consensus guidelines:      Jan 2019 nuclear stress T wave inversions in inferior leads and nonspecific T wave abnormalities in V6 seen throughout study. The study is normal. No myocardial ischemia or scar. This is a low risk study. Nuclear stress EF: 56%.     07/2017 event monitor 14 day event monitor Min HR 51, Max HR 115, Avg HR 69 Telemetry tracings show sinus rhythm and rate controlled atrial fibrillation Reported symptoms correlated with rate controlled afib     07/2019 cath Mid LM to Dist LM lesion is 25% stenosed. Mid LAD lesion is 40% stenosed. 1st Diag-1 lesion is 80% stenosed. 1st Diag-2 lesion is 90% stenosed. 1st Diag-3 lesion is 90% stenosed. Ost Cx to Prox Cx lesion is 75% stenosed. Mid Cx lesion is 90% stenosed. Prox RCA lesion is 35% stenosed. Dist RCA  lesion is 50% stenosed. The left ventricular systolic function is normal. LV end diastolic pressure is normal. The left ventricular ejection fraction is 55-65% by visual estimate.  1. Severe 2 vessel obstructive CAD. The RCA and LAD disease is stable compared to 2003. There is progressive disease in a large diagonal Darrelle Wiberg and the LCx    - the diagonal is a large bifurcating vessel.  It has multiple severe stenoses in the main Kiasia Chou with severely calcified lesions    - 75% proximal LCx and 90% mid LCx. There is an acutely angulated take off from the left main and the vessel is severely calcified. 2. Normal LV function 3. Normal LVEDP   Plan: discussed with Dr Burt Knack. The diagonal is poorly suited for PCI due to diffuse and heavily calcified disease. The LCx is complex and would require atherectomy. The acute angulation would make it technically difficult. For now would recommend aggressive medical therapy       Assessment and Plan  1. Paroxysmal aflutter/Afib/ Tachybrady syndrome with pacemaker -off anticoag due to prior GI bleeding. Was deemed not a candidate for watchman after EP evaluation - no symptoms, we will continue current meds - upcoming EP visit with device check   2. HTN - bp above goal, increase norvasc to '10mg'$  daily.   3. CAD   -recent NSTEMI as listed above, unable to cross LCX or D1 with wire. Managed medically - no current symptoms, continue current meds   4. Carotid stenosis - repeat carotid US.         Arnoldo Lenis, M.D.

## 2021-10-26 ENCOUNTER — Encounter: Payer: Self-pay | Admitting: Internal Medicine

## 2021-10-26 ENCOUNTER — Ambulatory Visit (INDEPENDENT_AMBULATORY_CARE_PROVIDER_SITE_OTHER): Payer: Medicare Other | Admitting: Internal Medicine

## 2021-10-26 VITALS — BP 148/73 | HR 60 | Ht 76.0 in | Wt 259.0 lb

## 2021-10-26 DIAGNOSIS — I480 Paroxysmal atrial fibrillation: Secondary | ICD-10-CM | POA: Diagnosis not present

## 2021-10-26 DIAGNOSIS — I6523 Occlusion and stenosis of bilateral carotid arteries: Secondary | ICD-10-CM | POA: Diagnosis not present

## 2021-10-26 DIAGNOSIS — E782 Mixed hyperlipidemia: Secondary | ICD-10-CM | POA: Diagnosis not present

## 2021-10-26 DIAGNOSIS — R001 Bradycardia, unspecified: Secondary | ICD-10-CM | POA: Diagnosis not present

## 2021-10-26 DIAGNOSIS — N186 End stage renal disease: Secondary | ICD-10-CM

## 2021-10-26 DIAGNOSIS — I2510 Atherosclerotic heart disease of native coronary artery without angina pectoris: Secondary | ICD-10-CM | POA: Diagnosis not present

## 2021-10-26 LAB — CUP PACEART INCLINIC DEVICE CHECK
Date Time Interrogation Session: 20230609154518
Implantable Lead Implant Date: 20190514
Implantable Lead Implant Date: 20190514
Implantable Lead Location: 753859
Implantable Lead Location: 753860
Implantable Lead Model: 7741
Implantable Lead Model: 7742
Implantable Lead Serial Number: 1015225
Implantable Lead Serial Number: 1020907
Implantable Pulse Generator Implant Date: 20190514
Lead Channel Impedance Value: 1085 Ohm
Lead Channel Impedance Value: 775 Ohm
Lead Channel Pacing Threshold Amplitude: 1.1 V
Lead Channel Pacing Threshold Amplitude: 1.3 V
Lead Channel Pacing Threshold Pulse Width: 0.4 ms
Lead Channel Pacing Threshold Pulse Width: 0.4 ms
Lead Channel Sensing Intrinsic Amplitude: 2.5 mV
Lead Channel Sensing Intrinsic Amplitude: 6.4 mV
Lead Channel Setting Pacing Amplitude: 2 V
Lead Channel Setting Pacing Amplitude: 2.5 V
Lead Channel Setting Pacing Pulse Width: 0.4 ms
Lead Channel Setting Sensing Sensitivity: 1.5 mV
Pulse Gen Serial Number: 832937

## 2021-10-26 NOTE — Patient Instructions (Signed)
Medication Instructions:  Your physician recommends that you continue on your current medications as directed. Please refer to the Current Medication list given to you today.  *If you need a refill on your cardiac medications before your next appointment, please call your pharmacy*   Lab Work: None ordered.  If you have labs (blood work) drawn today and your tests are completely normal, you will receive your results only by: [place] (if you have MyChart) OR A paper copy in the mail If you have any lab test that is abnormal or we need to change your treatment, we will call you to review the results.   Testing/Procedures: None ordered.    Follow-Up: At [facility], you and your health needs are our priority.  As part of our continuing mission to provide you with exceptional heart care, we have created designated Provider Care Teams.  These Care Teams include your primary Cardiologist (physician) and Advanced Practice Providers (APPs -  Physician Assistants and Nurse Practitioners) who all work together to provide you with the care you need, when you need it.  We recommend signing up for the patient portal called "MyChart".  Sign up information is provided on this After Visit Summary.  MyChart is used to connect with patients for Virtual Visits (Telemedicine).  Patients are able to view lab/test results, encounter notes, upcoming appointments, etc.  Non-urgent messages can be sent to your provider as well.   To learn more about what you can do with MyChart, go to [email or web address].    Your next appointment:   12 months with Dr Rayann Heman  Important Information About Sugar

## 2021-10-26 NOTE — Progress Notes (Signed)
PCP: Tobe Sos, MD Primary Cardiologist: Dr  [name] Primary EP:  Dr Rayann Heman  [name] is a 71 y.o. male who presents today for routine electrophysiology followup.  He has multiple chronic health issues.  He is primarily limited by back pain.  Not very active. Today, he denies symptoms of palpitations,  presyncope, or syncope.  Recently hospitalized for chest pain.  He has done well since.  The patient is otherwise without complaint today.   Past Medical History:  Diagnosis Date   AAA (abdominal aortic aneurysm) ([place]) 06/2016   Mild increase in size of 3.4 cm infrarenal abdominal aortic aneurysm   Anemia    Aortic atherosclerosis (HCC)    Arthritis    Asthma    Atrial flutter (HCC)    AVF (arteriovenous fistula) (HCC)    Left forearm   CAD (coronary artery disease)    a. s/p prior stenting of RCA in 1999 b. cath in 2003 showing moderate CAD c. NST in 2016 showing small area of ischemic and low-risk   CHF (congestive heart failure) ([place])    CKD (chronic kidney disease), stage V ([place])    kidney transplant evaluation   COPD (chronic obstructive pulmonary disease) ([place])    with ongoing tobacco use and patient failed sham takes   Diverticulosis 2018   Mild sigmoid colon diverticulosis.   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    Reflux esophagitis    hx   Two-vessel coronary artery disease    moderate. by cath in 2003. Status post stenting of the mdi RCA  November 1999 normal left ventricular ejection fraction   Wears glasses    Wears hearing aid    B/L   Past Surgical History:  Procedure Laterality Date   A/V FISTULAGRAM Left 04/30/2019   Procedure: A/V FISTULAGRAM;  Surgeon: Elam Dutch, MD;  Location: [facility];  Service: Cardiovascular;  Laterality: Left;   A/V FISTULAGRAM Left 09/22/2019   Procedure: A/V FISTULAGRAM;  Surgeon: Marty Heck, MD;  Location: [facility];  Service: Cardiovascular;  Laterality: Left;   ABDOMINAL SURGERY     APPENDECTOMY     AV FISTULA PLACEMENT Right 03/11/2018   Procedure: CREATION Brachiocephalic Fistula RIGHT ARM;  Surgeon: Rosetta Posner, MD;  Location: [facility];  Service: Vascular;  Laterality: Right;   AV FISTULA PLACEMENT Left 05/06/2019   Procedure: LEFT BRACHIOCEPHALIC ARTERIOVENOUS (AV) FISTULA CREATION;  Surgeon: Marty Heck, MD;  Location: [facility];  Service: Vascular;  Laterality: Left;   BACK SURGERY     x3; cages in patient's back since 2000   BIOPSY  11/12/2018   Procedure: BIOPSY;  Surgeon: Laurence Spates, MD;  Location: [facility];  Service: Endoscopy;;   CARDIAC CATHETERIZATION     stent   [place]  COLONOSCOPY W/ BIOPSIES AND POLYPECTOMY     COLONOSCOPY WITH PROPOFOL N/A 11/12/2018   Procedure: COLONOSCOPY WITH PROPOFOL;  Surgeon: Laurence Spates, MD;  [facility];  Service: Endoscopy;  Laterality: N/A;   COLONOSCOPY WITH PROPOFOL N/A 10/02/2021   Procedure: COLONOSCOPY WITH PROPOFOL;  Surgeon: Clarene Essex, MD;  [facility];  Service: Gastroenterology;  Laterality: N/A;   CORONARY ANGIOPLASTY     CYSTOSCOPY WITH INSERTION OF UROLIFT N/A 11/06/2020   Procedure: CYSTOSCOPY WITH INSERTION OF UROLIFT;  Surgeon: Cleon Gustin, MD;  Location: [facility];  Service: Urology;  Laterality: N/A;   diabetic ulcers     DIAGNOSTIC LAPAROSCOPY     DIALYSIS/PERMA CATHETER REMOVAL N/A  09/22/2019   Procedure: DIALYSIS/PERMA CATHETER REMOVAL;  Surgeon: Marty Heck, MD;  Location: [facility];  Service: Cardiovascular;  Laterality: N/A;   ESOPHAGOGASTRODUODENOSCOPY (EGD) WITH PROPOFOL N/A 11/12/2018   Procedure: ESOPHAGOGASTRODUODENOSCOPY (EGD) WITH PROPOFOL;  Surgeon: Laurence Spates, MD;  [facility];  Service: Endoscopy;  Laterality: N/A;   GROIN EXPLORATION     HEMOSTASIS CLIP PLACEMENT  11/12/2018   Procedure: HEMOSTASIS CLIP PLACEMENT;  Surgeon: Laurence Spates, MD;  [facility];  Service: Endoscopy;;   HIP ARTHROPLASTY Left    gamma nail   INSERT / REPLACE / [place] (AV) ARTEGRAFT ARM Left 03/18/2018   Procedure: INSERTION OF ARTERIOVENOUS (AV) FISTULA;  Surgeon: Rosetta Posner, MD;  Location: [facility];  Service: Vascular;  Laterality: Left;   IR FLUORO GUIDE CV LINE RIGHT  04/11/2019   IR US GUIDE VASC ACCESS RIGHT  04/11/2019   LEFT HEART CATH AND CORONARY ANGIOGRAPHY N/A 07/23/2019   Procedure: LEFT HEART CATH AND CORONARY ANGIOGRAPHY;  Surgeon: Martinique, Peter M, MD;  Location: [facility];  Service: Cardiovascular;  Laterality: N/A;   LEFT HEART CATH AND CORONARY ANGIOGRAPHY N/A 10/10/2021   Procedure: LEFT HEART CATH AND CORONARY ANGIOGRAPHY;  Surgeon: [name], MD;  Location: [facility];  Service: Cardiovascular;  Laterality: N/A;   LIGATION ARTERIOVENOUS GORTEX GRAFT Right 03/18/2018   Procedure: LIGATION ARTERIOVENOUS FISTULA;  Surgeon: Rosetta Posner, MD;  Location: [facility];  Service: Vascular;  Laterality: Right;   OSTECTOMY Left 04/23/2017   Procedure: OSTECTOMY LEFT GREAT TOE;  Surgeon: [name], DPM;  [facility];  Service: Podiatry;  Laterality: Left;  left great toe   POLYPECTOMY  11/12/2018   Procedure: POLYPECTOMY;  Surgeon: Laurence Spates, MD;  [facility];  Service: Endoscopy;;   POLYPECTOMY  10/02/2021   Procedure: POLYPECTOMY;   Surgeon: Clarene Essex, MD;  Location: [facility];  Service: Gastroenterology;;   SPINAL CORD STIMULATOR INSERTION N/A 12/20/2020   Procedure: Permanent Spinal Cord Stimulator  Lead Implant with Fluoroscopy and Battery Implant;  Surgeon: Reece Agar, MD;  Location: [facility];  Service: Neurosurgery;  Laterality: N/A;   TUMOR REMOVAL     small intestine x3   VISCERAL ANGIOGRAPHY N/A 08/02/2019   Procedure: VISCERAL ANGIOGRAPHY;  Surgeon: Waynetta Sandy, MD;  Location: [facility];  Service: Cardiovascular;  Laterality: N/A;    ROS- all systems are reviewed and negative except as per HPI above  Current Outpatient Medications  Medication Sig Dispense Refill   albuterol (VENTOLIN HFA) 108 (90 Base) MCG/ACT inhaler Inhale 2 puffs into the lungs every 6 (six) hours as needed for wheezing or shortness of breath.      amLODipine (NORVASC) 10 MG tablet  Take 1 tablet (10 mg total) by mouth daily. 90 tablet 1   aspirin EC 81 MG EC tablet Take 1 tablet (81 mg total) by mouth daily.     atorvastatin (LIPITOR) 40 MG tablet Take 1 tablet by mouth once daily (Patient taking differently: Take 40 mg by mouth daily.) 90 tablet 3   beclomethasone (QVAR) 80 MCG/ACT inhaler Inhale 2 puffs into the lungs in the morning and at bedtime.     CALCIUM CITRATE PO Take 600 mg by mouth in the morning and at bedtime.     carvedilol (COREG) 25 MG tablet Take 1 tablet (25 mg total) by mouth 2 (two) times daily with a meal. 180 tablet 3   Cholecalciferol (VITAMIN D3) 2000 units TABS Take 2,000 Units by mouth daily.      clobetasol (TEMOVATE) 0.05 % external solution Apply 1 application topically 2 (two) times daily as needed (itching).     clopidogrel (PLAVIX) 75 MG tablet Take 1 tablet (75 mg total) by mouth daily with breakfast. 30 tablet 0   denosumab (PROLIA) 60 MG/ML SOSY injection Inject 60 mg into the skin every 6 (six) months.     Ergocalciferol (CALCIFEROL PO) Take 0.25 mg by mouth daily.      linaclotide (LINZESS) 290 MCG CAPS capsule Take 290 mcg by mouth daily before breakfast.     magnesium oxide (MAG-OX) 400 MG tablet Take 400 mg by mouth daily.     Melatonin 10 MG TABS Take 20 mg by mouth at bedtime.     multivitamin (RENA-VIT) TABS tablet Take 1 tablet by mouth daily.     nitroGLYCERIN (NITROSTAT) 0.4 MG SL tablet Place 1 tablet (0.4 mg total) under the tongue every 5 (five) minutes as needed for chest pain. 30 tablet 6   pantoprazole (PROTONIX) 40 MG tablet Take 1 tablet (40 mg total) by mouth daily. 90 tablet 0   Potassium Chloride ER 20 MEQ TBCR Take 20 mEq by mouth daily.     risperiDONE (RISPERDAL) 0.5 MG tablet Take 0.5 mg by mouth at bedtime.     rOPINIRole (REQUIP) 1 MG tablet Take 1 mg by mouth daily.     senna-docusate (SENOKOT-S) 8.6-50 MG tablet Take 2 tablets by mouth at bedtime as needed for mild constipation.     sertraline (ZOLOFT) 25 MG tablet Take 25 mg by mouth in the morning.     traMADol (ULTRAM) 50 MG tablet Take 50 mg by mouth every 4 (four) hours as needed for moderate pain.     No current facility-administered medications for this visit.    Physical Exam: Vitals:   10/26/21 1106  BP: (!) 148/73  Pulse: 60  SpO2: 98%  Weight: 259 lb (117.5 kg)  Height: '6\' 4"'$  (1.93 m)    GEN- The patient is chronically ill appearing, alert and oriented x 3 today.   Head- normocephalic, atraumatic Eyes-  Sclera clear, conjunctiva pink Ears- hearing intact Oropharynx- clear Lungs- Clear to ausculation bilaterally, normal work of breathing Chest- pacemaker pocket is well healed Heart- Regular rate and rhythm, no murmurs, rubs or gallops, PMI not laterally displaced GI- soft, NT, ND, + BS Extremities- no clubbing, cyanosis, or edema  Pacemaker interrogation- reviewed in detail today,  See PACEART report    Assessment and Plan:  1. Symptomatic sinus bradycardia  Normal pacemaker function See Pace Art report Chronically elevated RV threshold RV sensing  is also low RV pacing 30% (previously 3%).  I have adjusted AV delay today to  reduce RV pacing. No plans for device revision at this time he is not device dependant today  2. Paroxysmal atrial fibrillation Chads2vasc score is 3.   He has been referred by me for watchman previously.  This was declined by Dr Quentin Ore due to high risks.  3. CAD No ischemic symptoms  4. ESRD On HD  5. HTN Stable No change required today   Risks, benefits and potential toxicities for medications prescribed and/or refilled reviewed with patient today.   Return in a year  Thompson Grayer MD, [facility] 10/26/2021 11:08 AM

## 2021-10-30 ENCOUNTER — Encounter: Payer: Self-pay | Admitting: Cardiology

## 2021-11-05 ENCOUNTER — Encounter: Payer: Self-pay | Admitting: Cardiology

## 2021-11-12 NOTE — Telephone Encounter (Signed)
Coming onboard preop today, chart retained under pt advice requests. Agree with rec below where CMA recommended pt contact dentist for formal clearance request, no further action needed on this msg until formal request is received. Will remove this msg from preop pool.

## 2021-11-19 IMAGING — CT CT L SPINE W/ CM
3 of 5 series · 12 of 33 positions shown, 14 images · IV contrast (omnipaque)
Comparison: 11/03/2019

CLINICAL DATA: Low back pain, kyphoplasty 1 week ago

EXAM:
CT LUMBAR SPINE WITH CONTRAST
TECHNIQUE: Multidetector CT imaging of the lumbar spine was performed with
intravenous contrast administration.
CONTRAST:  100mL OMNIPAQUE IOHEXOL 300 MG/ML  SOLN

[Series 6: l spine soft · axial · 0.45mm/px · z∈[+558,+818]mm · 6 of 170 slices shown, 8 images]
[im 27/170  soft-tissue]
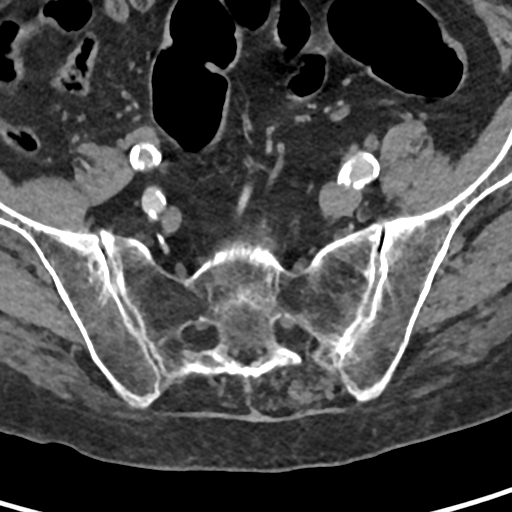
[im 27/170  bone]
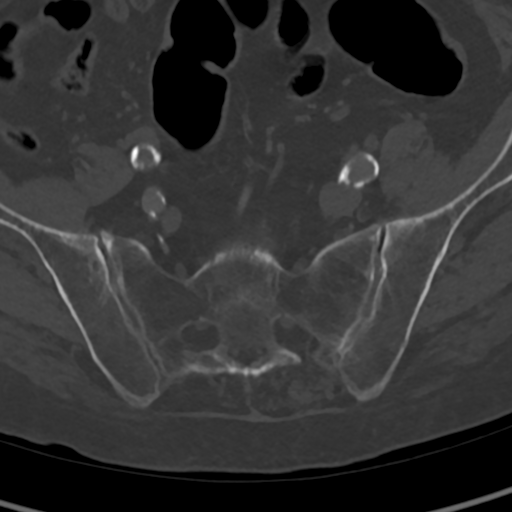
[im 53/170  bone]
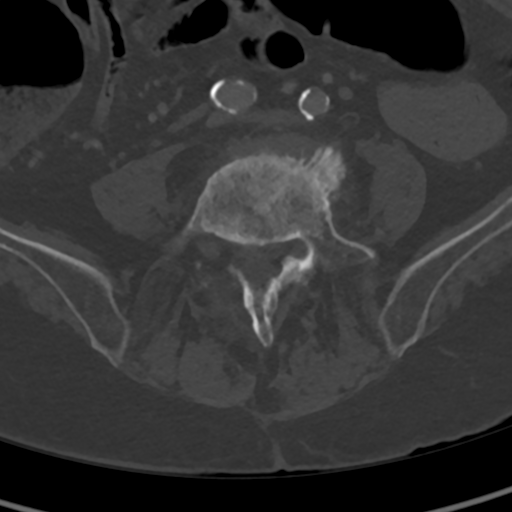
[im 79/170  bone]
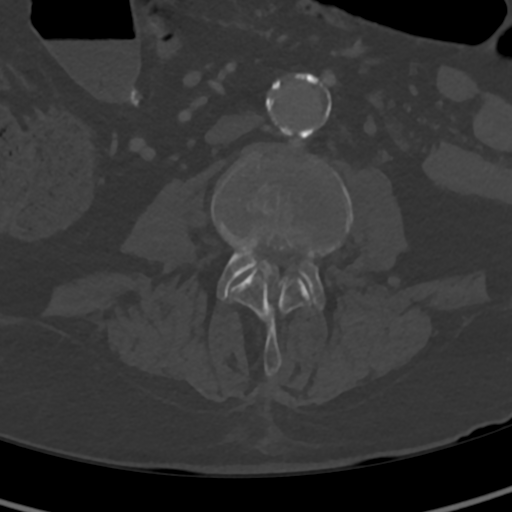
[im 105/170  bone]
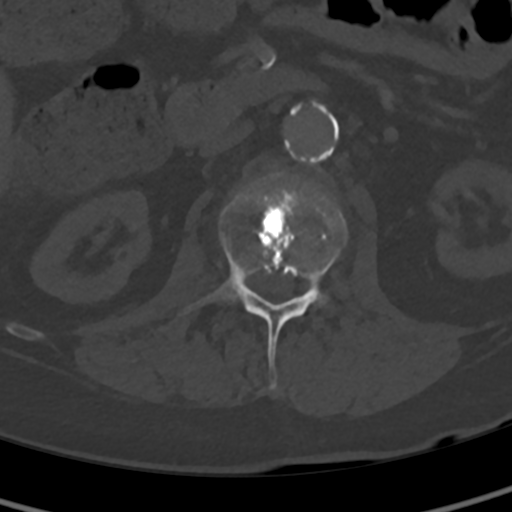
[im 131/170  soft-tissue]
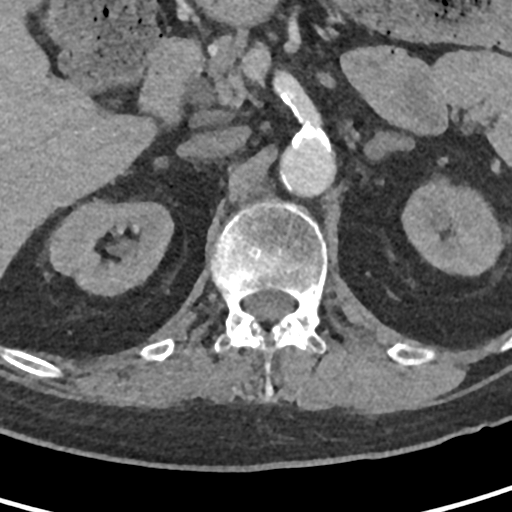
[im 131/170  bone]
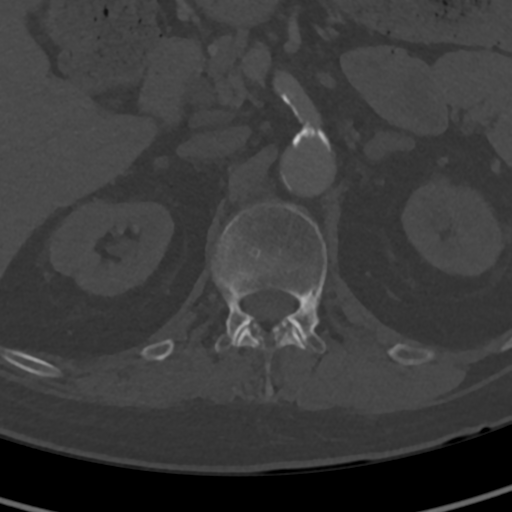
[im 157/170  bone]
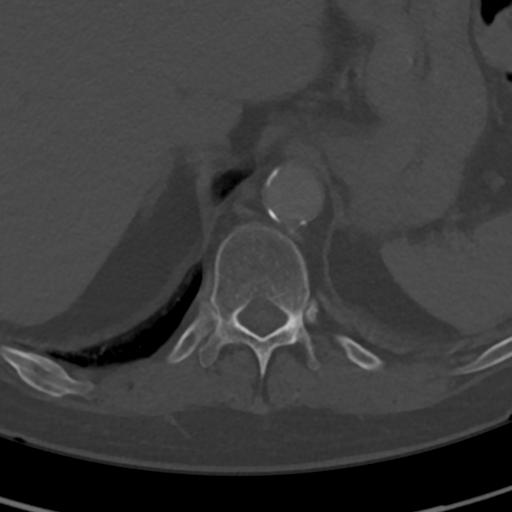

[Series 8: cor bone · coronal · 0.40mm/px · 1 of 102 slices shown]
[im 51/102  bone]
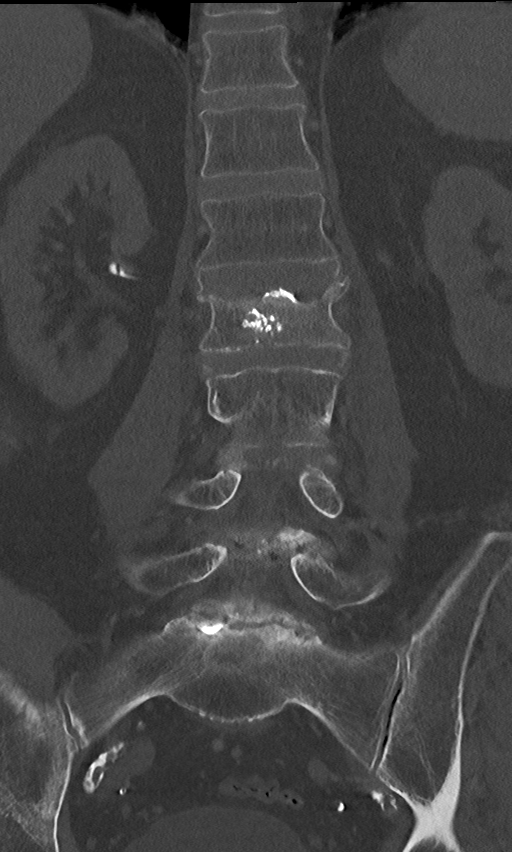

[Series 10: sag st · sagittal · 0.36mm/px · 5 of 100 slices shown]
[im 17/100  bone]
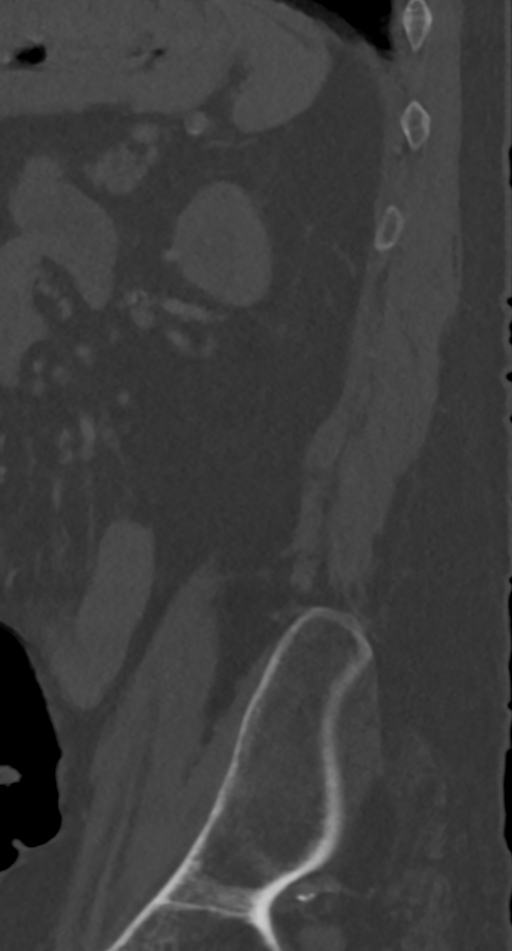
[im 34/100  bone]
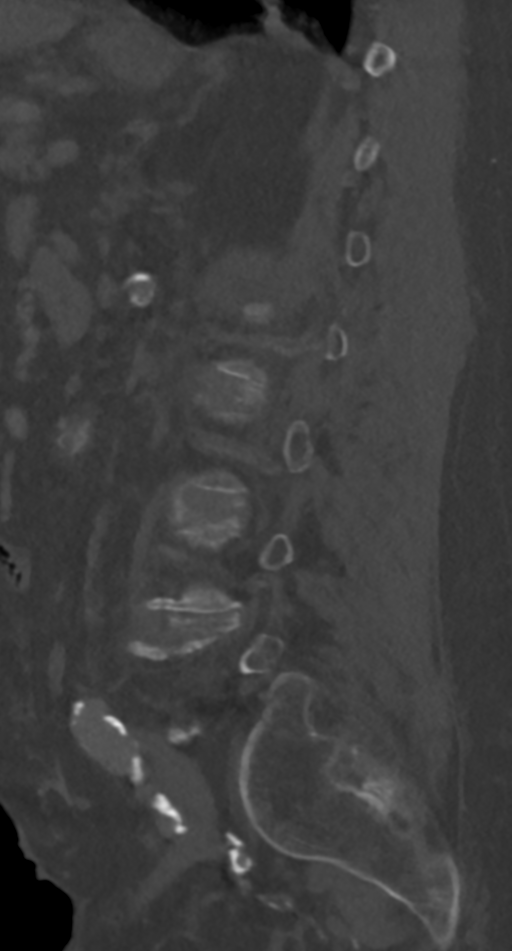
[im 50/100  bone]
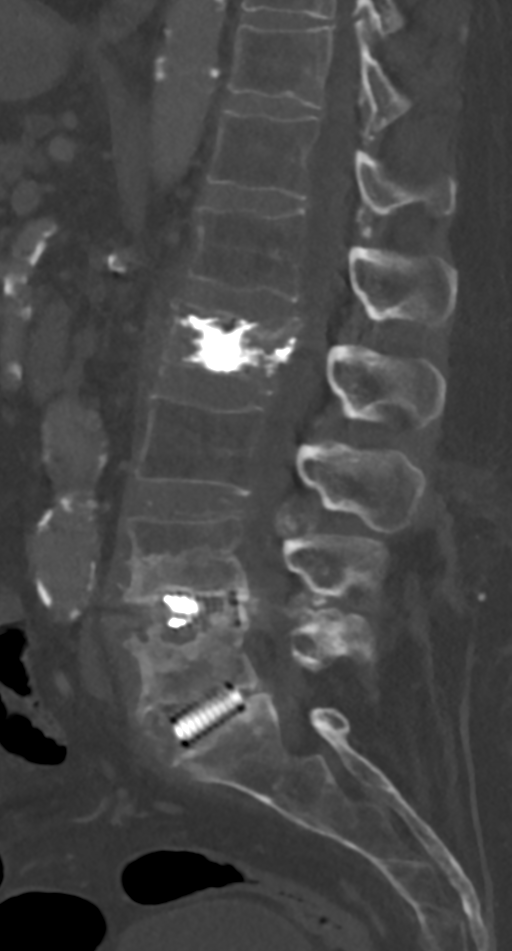
[im 67/100  bone]
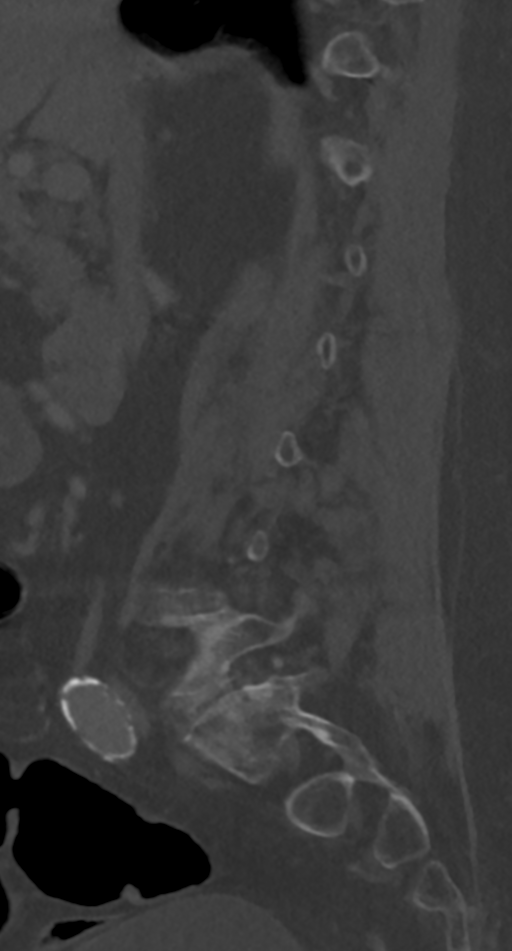
[im 83/100  bone]
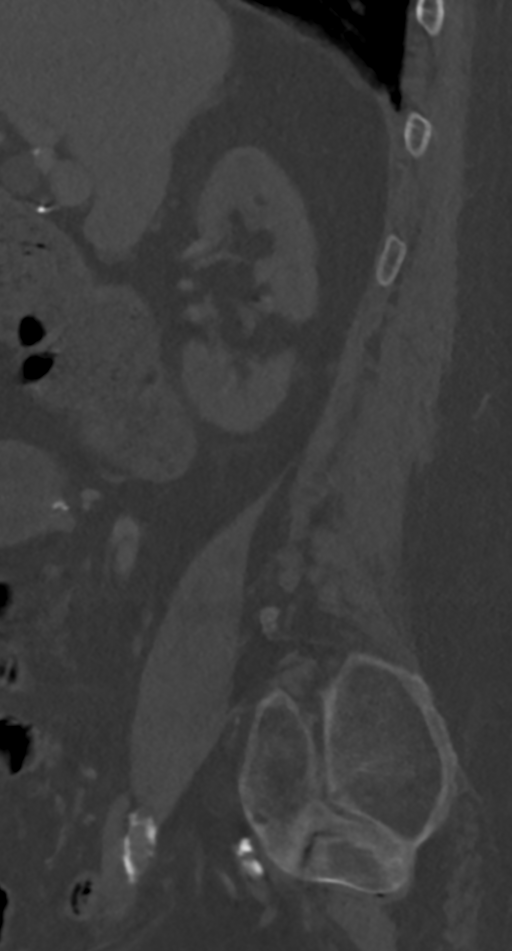

[12 of 33 positions shown; findings below may reference images not displayed]

FINDINGS: Segmentation: 5 lumbar type vertebrae.

Alignment: Alignment is anatomic.

Vertebrae: L2 compression deformity is again identified with
interval changes of vertebral augmentation. There is minimal
extravasation of methylmethacrylate into the L1/L2 disc space and
along the posterior margin of the L2 vertebral body.

No other acute bony abnormalities.

Paraspinal and other soft tissues: Very mild paraspinal soft tissue
swelling at the L2 level consistent with the subacute compression
deformity. No evidence of paraspinal fluid collection or abscess on
this exam.

3.6 cm infrarenal abdominal aortic aneurysm is unchanged since prior
abdominal CT. There is extensive atherosclerosis of the aorta and
its branches. Visualized retroperitoneal soft tissues are
unremarkable. Stable Bosniak type 2 cyst off the upper pole right
kidney, previously evaluated 07/28/2019.

Disc levels: Stable postsurgical changes from L4/L5 and L5/S1
discectomy and laminotomy.

Mild retropulsion from the L2 compression fracture results in
flattening of the ventral margin of the thecal sac at L2.
IMPRESSION: 1. Subacute L2 compression deformity with interval vertebral
augmentation. No evidence of complication.
2. Chronic postsurgical changes at L4-5 and L5-S1.
3. Stable 3.6 cm infrarenal abdominal aortic aneurysm.
4.  Aortic Atherosclerosis ([id number]).

## 2021-11-19 IMAGING — CT CT HEAD W/O CM
4 series · 15 of 47 positions shown, 17 images · non-contrast
Comparison: None.

CLINICAL DATA: Psychosis

EXAM:
CT HEAD WITHOUT CONTRAST
TECHNIQUE: Contiguous axial images were obtained from the base of the skull
through the vertex without intravenous contrast.

[Series 3: head wo · axial · 0.39mm/px · z∈[+1223,+1367]mm · 7 of 39 slices shown, 9 images]
[im 5/39  brain]
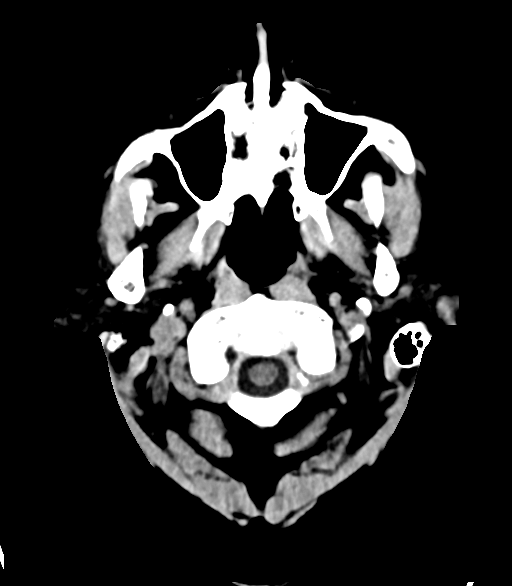
[im 5/39  bone]
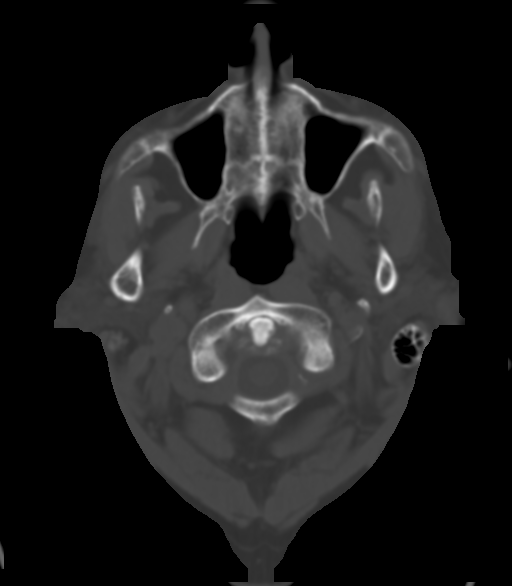
[im 10/39  brain]
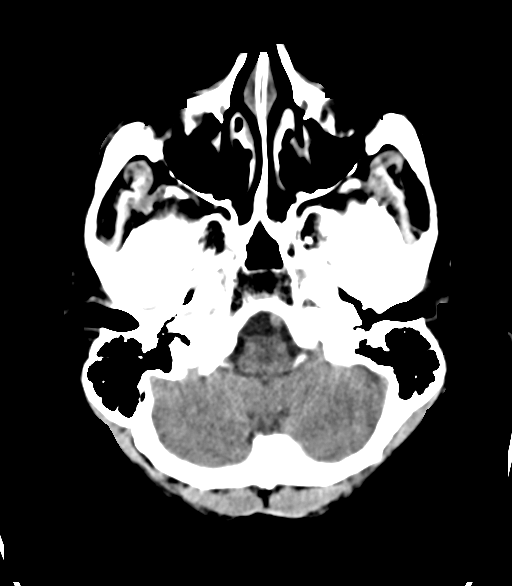
[im 15/39  brain]
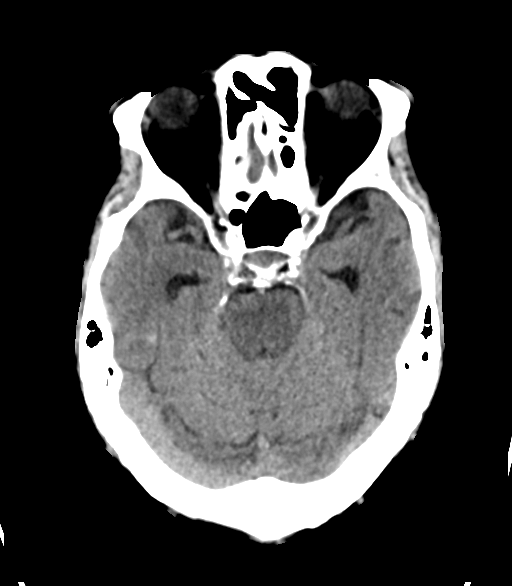
[im 20/39  brain]
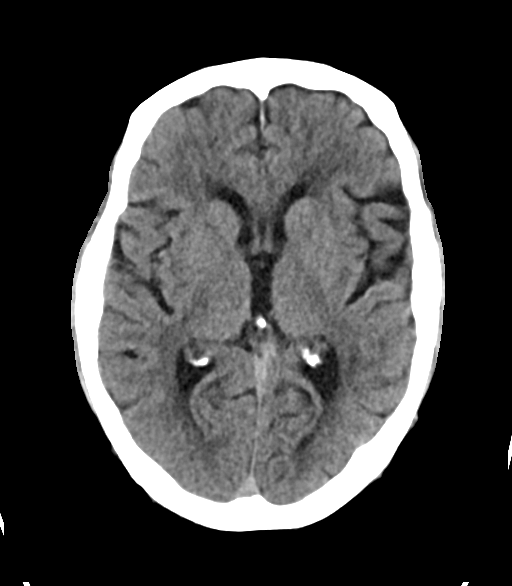
[im 24/39  brain]
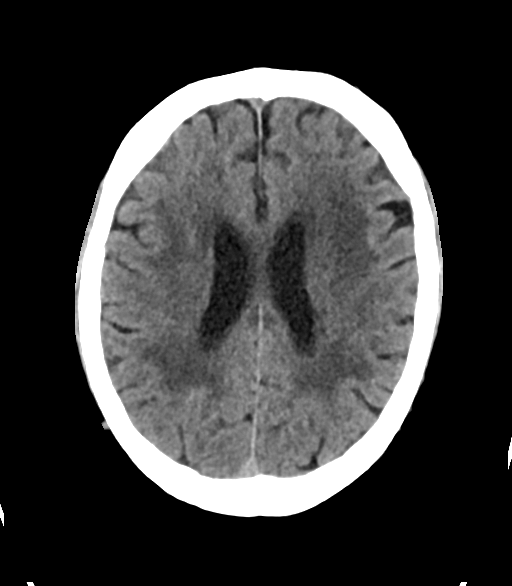
[im 24/39  bone]
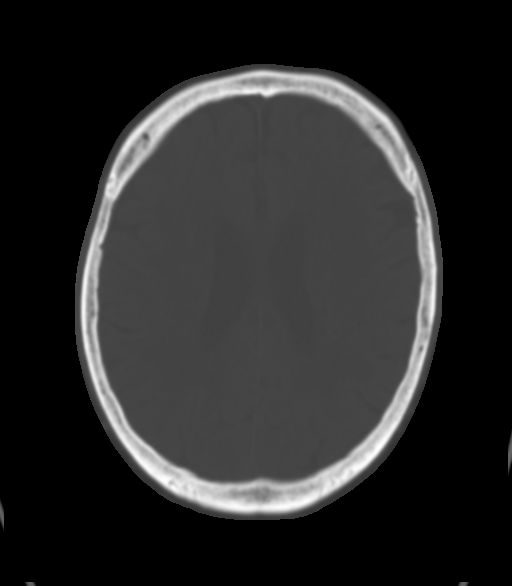
[im 29/39  brain]
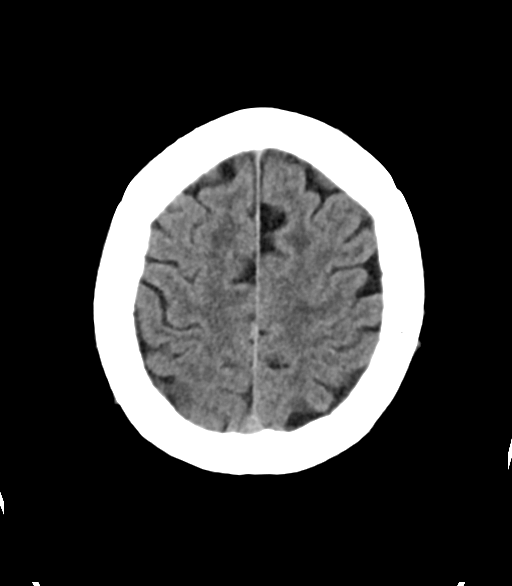
[im 34/39  brain]
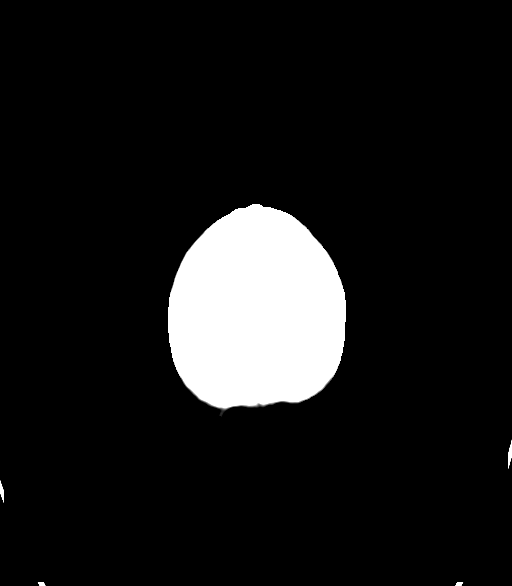

[Series 4: head bone · axial · 0.46mm/px · z∈[+1214,+1232]mm · 2 of 92 slices shown]
[im 10/92  bone]
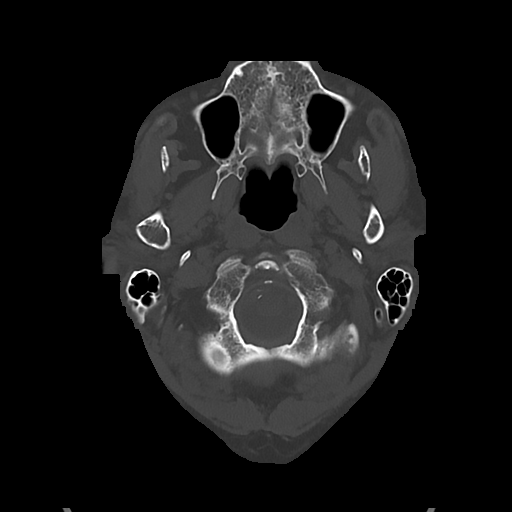
[im 19/92  bone]
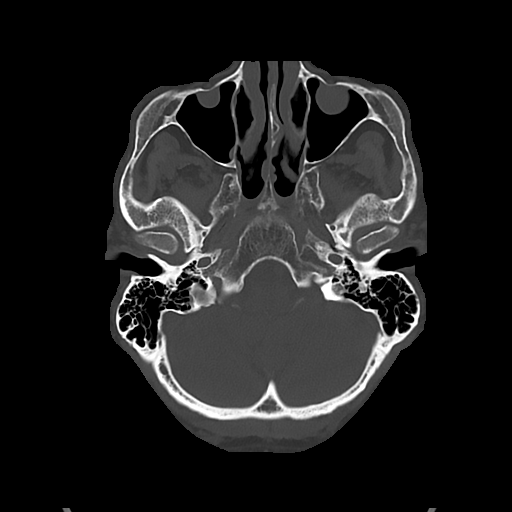

[Series 5: cor soft · coronal · 0.35mm/px · 3 of 77 slices shown]
[im 26/77  brain]
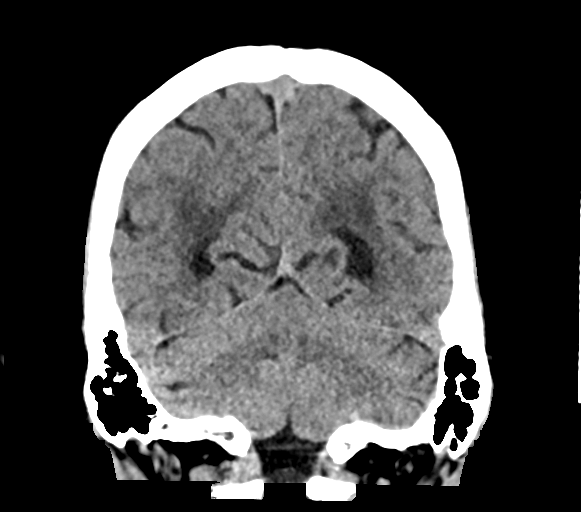
[im 34/77  brain]
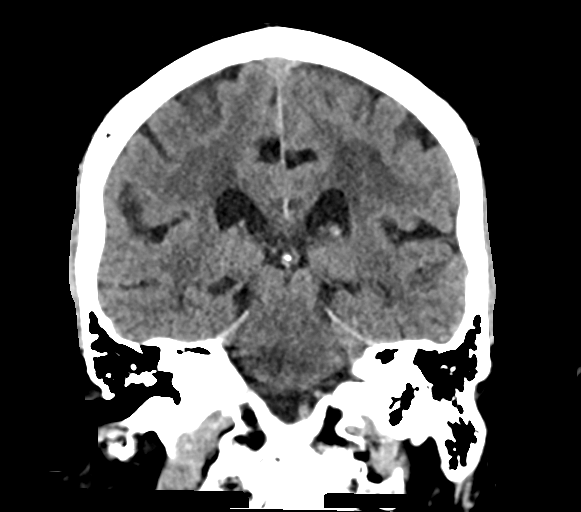
[im 43/77  brain]
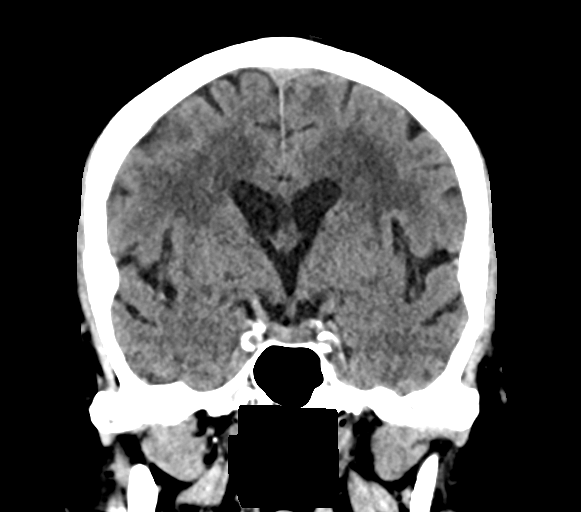

[Series 6: sag soft · sagittal · 0.35mm/px · 3 of 68 slices shown]
[im 23/68  brain]
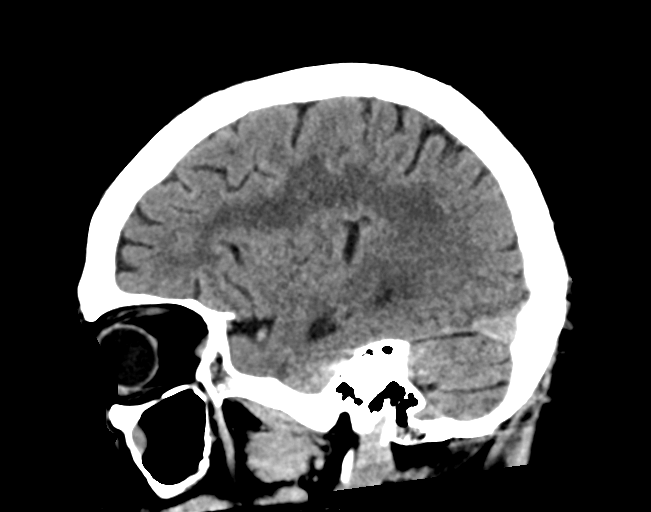
[im 34/68  brain]
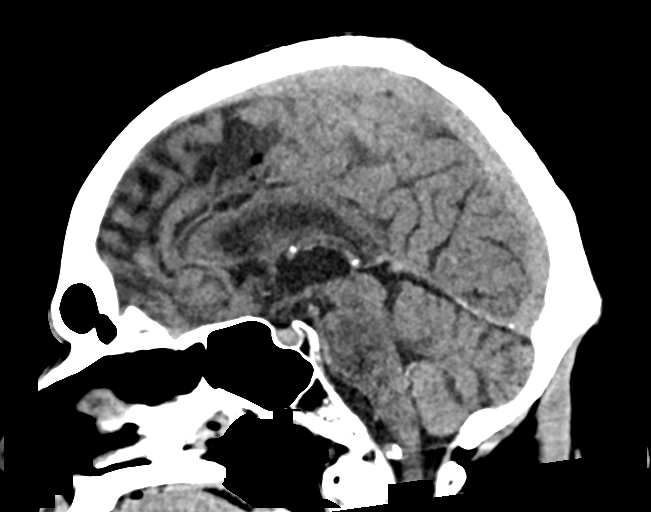
[im 45/68  brain]
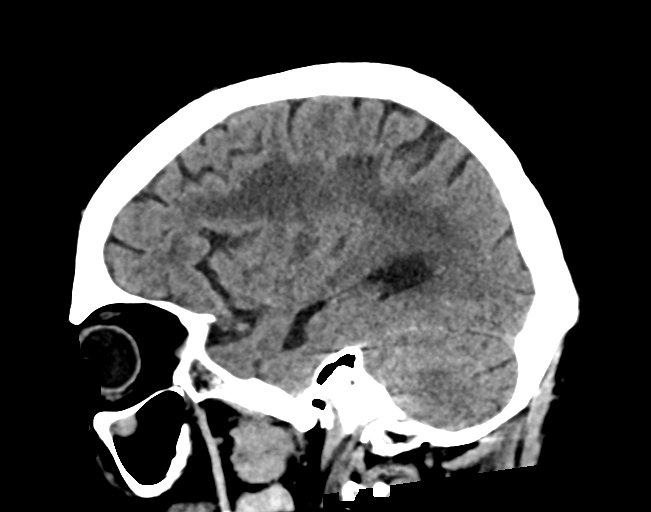

[15 of 47 positions shown; findings below may reference images not displayed]

FINDINGS: Brain: Ventricle size normal. Extensive white matter disease
bilaterally. This is most consistent with chronic microvascular
ischemia. No prior studies for confirmation. Chronic infarct right
basal ganglia.

Negative for acute infarct, hemorrhage, mass

Vascular: Negative for hyperdense vessel

Skull: Negative for skull lesion.

Sinuses/Orbits: Mucosal edema paranasal sinuses.  Negative orbit.

Other: None
IMPRESSION: Extensive chronic microvascular ischemic changes in the white
matter. No acute abnormality.

## 2021-12-03 ENCOUNTER — Ambulatory Visit (INDEPENDENT_AMBULATORY_CARE_PROVIDER_SITE_OTHER): Payer: Medicare Other

## 2021-12-03 DIAGNOSIS — I495 Sick sinus syndrome: Secondary | ICD-10-CM

## 2021-12-05 ENCOUNTER — Encounter: Payer: Self-pay | Admitting: Cardiology

## 2021-12-05 LAB — CUP PACEART REMOTE DEVICE CHECK
Battery Remaining Longevity: 144 mo
Battery Remaining Percentage: 100 %
Brady Statistic RA Percent Paced: 56 %
Brady Statistic RV Percent Paced: 12 %
Date Time Interrogation Session: 20230719044000
Implantable Lead Implant Date: 20190514
Implantable Lead Implant Date: 20190514
Implantable Lead Location: 753859
Implantable Lead Location: 753860
Implantable Lead Model: 7741
Implantable Lead Model: 7742
Implantable Lead Serial Number: 1015225
Implantable Lead Serial Number: 1020907
Implantable Pulse Generator Implant Date: 20190514
Lead Channel Impedance Value: 1132 Ohm
Lead Channel Impedance Value: 761 Ohm
Lead Channel Pacing Threshold Amplitude: 0.7 V
Lead Channel Pacing Threshold Amplitude: 1.1 V
Lead Channel Pacing Threshold Pulse Width: 0.4 ms
Lead Channel Pacing Threshold Pulse Width: 0.4 ms
Lead Channel Setting Pacing Amplitude: 2 V
Lead Channel Setting Pacing Amplitude: 2.5 V
Lead Channel Setting Pacing Pulse Width: 0.4 ms
Lead Channel Setting Sensing Sensitivity: 1.5 mV
Pulse Gen Serial Number: 832937

## 2021-12-11 ENCOUNTER — Telehealth: Payer: Self-pay

## 2021-12-11 NOTE — Telephone Encounter (Signed)
Patients wife called to see if patient needs to be seen or if they need to drop off a urine to check for infection.  She states the patient is having urine incontinence, denies any other symptoms just unable to control bladder.  Please advise.

## 2021-12-12 NOTE — Telephone Encounter (Signed)
Verbal from Dr. Alyson Ingles to have pt drop off urine.  Scheduled lab visit for ua 12/13/21 2pm

## 2021-12-13 ENCOUNTER — Other Ambulatory Visit: Payer: Medicare Other

## 2021-12-13 ENCOUNTER — Encounter: Payer: Self-pay | Admitting: Cardiology

## 2021-12-13 DIAGNOSIS — R32 Unspecified urinary incontinence: Secondary | ICD-10-CM

## 2021-12-14 LAB — MICROSCOPIC EXAMINATION
RBC, Urine: NONE SEEN /hpf (ref 0–2)
Renal Epithel, UA: NONE SEEN /hpf

## 2021-12-14 LAB — URINALYSIS, ROUTINE W REFLEX MICROSCOPIC
Bilirubin, UA: NEGATIVE
Ketones, UA: NEGATIVE
Leukocytes,UA: NEGATIVE
Nitrite, UA: NEGATIVE
RBC, UA: NEGATIVE
Specific Gravity, UA: 1.01 (ref 1.005–1.030)
Urobilinogen, Ur: 0.2 mg/dL (ref 0.2–1.0)
pH, UA: 5 (ref 5.0–7.5)

## 2021-12-16 LAB — URINE CULTURE: Organism ID, Bacteria: NO GROWTH

## 2021-12-16 LAB — SPECIMEN STATUS REPORT

## 2021-12-24 ENCOUNTER — Encounter: Payer: Self-pay | Admitting: *Deleted

## 2021-12-25 NOTE — Telephone Encounter (Signed)
Since he did not get a stent would be ok to have done 3 months out, would be ok to come off plavix for few days before but would need to resumre few days after.  Zandra Abts MD

## 2021-12-28 ENCOUNTER — Encounter: Payer: Self-pay | Admitting: Urology

## 2021-12-28 NOTE — Telephone Encounter (Signed)
Please see below.

## 2022-01-08 NOTE — Progress Notes (Signed)
Remote pacemaker transmission.   

## 2022-02-28 ENCOUNTER — Other Ambulatory Visit: Payer: Self-pay

## 2022-03-04 ENCOUNTER — Ambulatory Visit (INDEPENDENT_AMBULATORY_CARE_PROVIDER_SITE_OTHER): Payer: Medicare Other

## 2022-03-04 DIAGNOSIS — I495 Sick sinus syndrome: Secondary | ICD-10-CM | POA: Diagnosis not present

## 2022-03-06 LAB — CUP PACEART REMOTE DEVICE CHECK
Battery Remaining Longevity: 126 mo
Battery Remaining Percentage: 100 %
Brady Statistic RA Percent Paced: 50 %
Brady Statistic RV Percent Paced: 28 %
Date Time Interrogation Session: 20231018044200
Implantable Lead Implant Date: 20190514
Implantable Lead Implant Date: 20190514
Implantable Lead Location: 753859
Implantable Lead Location: 753860
Implantable Lead Model: 7741
Implantable Lead Model: 7742
Implantable Lead Serial Number: 1015225
Implantable Lead Serial Number: 1020907
Implantable Pulse Generator Implant Date: 20190514
Lead Channel Impedance Value: 1088 Ohm
Lead Channel Impedance Value: 712 Ohm
Lead Channel Pacing Threshold Amplitude: 1.4 V
Lead Channel Pacing Threshold Pulse Width: 0.4 ms
Lead Channel Setting Pacing Amplitude: 2 V
Lead Channel Setting Pacing Amplitude: 2.5 V
Lead Channel Setting Pacing Pulse Width: 0.4 ms
Lead Channel Setting Sensing Sensitivity: 1.5 mV
Pulse Gen Serial Number: 832937

## 2022-03-10 ENCOUNTER — Other Ambulatory Visit: Payer: Self-pay | Admitting: Cardiology

## 2022-03-21 ENCOUNTER — Encounter: Payer: Self-pay | Admitting: Cardiology

## 2022-03-27 ENCOUNTER — Ambulatory Visit: Payer: Medicare Other | Admitting: Urology

## 2022-03-28 ENCOUNTER — Telehealth: Payer: Self-pay | Admitting: Cardiology

## 2022-03-28 NOTE — Telephone Encounter (Signed)
   Primary Cardiologist: Carlyle [facility], MD  We do not provide "blanket' clearances, therefore if more extensive work is needed and the dentist is requesting that patient hold anti-platelet medications or anticoagulants, we should receive a separate clearance request. There is no precaution with local anesthesia being administered.   Chart reviewed as part of pre-operative protocol coverage. Simple dental extractions (1-2 teeth) are considered low risk procedures per guidelines and generally do not require any specific cardiac clearance. It is also generally accepted that for simple extractions and dental cleanings, there is no need to interrupt blood thinner therapy.   SBE prophylaxis is not required for the patient.  I will route this recommendation to the requesting party via Epic fax function and remove from pre-op pool.  Please call with questions.  Emmaline Life, NP-C  03/28/2022, 3:38 PM [address] Office [phone] Fax [phone]

## 2022-04-09 ENCOUNTER — Other Ambulatory Visit: Payer: Self-pay

## 2022-04-09 ENCOUNTER — Inpatient Hospital Stay (HOSPITAL_COMMUNITY)
Admission: EM | Admit: 2022-04-09 | Discharge: 2022-04-14 | DRG: 073 | Disposition: A | Discharge status: Home / Self Care | Payer: Medicare Other | Attending: Student in an Organized Health Care Education/Training Program | Admitting: Student in an Organized Health Care Education/Training Program

## 2022-04-09 ENCOUNTER — Emergency Department (HOSPITAL_COMMUNITY): Payer: Medicare Other

## 2022-04-09 ENCOUNTER — Encounter (HOSPITAL_COMMUNITY): Payer: Self-pay

## 2022-04-09 DIAGNOSIS — I872 Venous insufficiency (chronic) (peripheral): Secondary | ICD-10-CM | POA: Diagnosis present

## 2022-04-09 DIAGNOSIS — Z992 Dependence on renal dialysis: Secondary | ICD-10-CM | POA: Diagnosis not present

## 2022-04-09 DIAGNOSIS — M810 Age-related osteoporosis without current pathological fracture: Secondary | ICD-10-CM | POA: Diagnosis present

## 2022-04-09 DIAGNOSIS — Z955 Presence of coronary angioplasty implant and graft: Secondary | ICD-10-CM

## 2022-04-09 DIAGNOSIS — Z950 Presence of cardiac pacemaker: Secondary | ICD-10-CM

## 2022-04-09 DIAGNOSIS — Z87891 Personal history of nicotine dependence: Secondary | ICD-10-CM

## 2022-04-09 DIAGNOSIS — E1122 Type 2 diabetes mellitus with diabetic chronic kidney disease: Secondary | ICD-10-CM | POA: Diagnosis present

## 2022-04-09 DIAGNOSIS — F419 Anxiety disorder, unspecified: Secondary | ICD-10-CM | POA: Diagnosis present

## 2022-04-09 DIAGNOSIS — E1169 Type 2 diabetes mellitus with other specified complication: Secondary | ICD-10-CM | POA: Diagnosis present

## 2022-04-09 DIAGNOSIS — F329 Major depressive disorder, single episode, unspecified: Secondary | ICD-10-CM | POA: Diagnosis not present

## 2022-04-09 DIAGNOSIS — J4489 Other specified chronic obstructive pulmonary disease: Secondary | ICD-10-CM | POA: Diagnosis present

## 2022-04-09 DIAGNOSIS — R1033 Periumbilical pain: Secondary | ICD-10-CM

## 2022-04-09 DIAGNOSIS — I5082 Biventricular heart failure: Secondary | ICD-10-CM | POA: Diagnosis not present

## 2022-04-09 DIAGNOSIS — G4733 Obstructive sleep apnea (adult) (pediatric): Secondary | ICD-10-CM | POA: Diagnosis present

## 2022-04-09 DIAGNOSIS — E785 Hyperlipidemia, unspecified: Secondary | ICD-10-CM | POA: Diagnosis not present

## 2022-04-09 DIAGNOSIS — G588 Other specified mononeuropathies: Secondary | ICD-10-CM | POA: Diagnosis not present

## 2022-04-09 DIAGNOSIS — Z9682 Presence of neurostimulator: Secondary | ICD-10-CM

## 2022-04-09 DIAGNOSIS — I5032 Chronic diastolic (congestive) heart failure: Secondary | ICD-10-CM | POA: Diagnosis not present

## 2022-04-09 DIAGNOSIS — E669 Obesity, unspecified: Secondary | ICD-10-CM | POA: Diagnosis not present

## 2022-04-09 DIAGNOSIS — Z1152 Encounter for screening for COVID-19: Secondary | ICD-10-CM | POA: Diagnosis not present

## 2022-04-09 DIAGNOSIS — I2489 Other forms of acute ischemic heart disease: Secondary | ICD-10-CM | POA: Diagnosis not present

## 2022-04-09 DIAGNOSIS — D631 Anemia in chronic kidney disease: Secondary | ICD-10-CM | POA: Diagnosis not present

## 2022-04-09 DIAGNOSIS — Z79899 Other long term (current) drug therapy: Secondary | ICD-10-CM

## 2022-04-09 DIAGNOSIS — M549 Dorsalgia, unspecified: Secondary | ICD-10-CM | POA: Diagnosis present

## 2022-04-09 DIAGNOSIS — Z79891 Long term (current) use of opiate analgesic: Secondary | ICD-10-CM

## 2022-04-09 DIAGNOSIS — Z888 Allergy status to other drugs, medicaments and biological substances status: Secondary | ICD-10-CM

## 2022-04-09 DIAGNOSIS — N186 End stage renal disease: Secondary | ICD-10-CM | POA: Diagnosis present

## 2022-04-09 DIAGNOSIS — R531 Weakness: Secondary | ICD-10-CM

## 2022-04-09 DIAGNOSIS — R188 Other ascites: Secondary | ICD-10-CM | POA: Diagnosis present

## 2022-04-09 DIAGNOSIS — Z7951 Long term (current) use of inhaled steroids: Secondary | ICD-10-CM

## 2022-04-09 DIAGNOSIS — R410 Disorientation, unspecified: Secondary | ICD-10-CM | POA: Diagnosis present

## 2022-04-09 DIAGNOSIS — E1151 Type 2 diabetes mellitus with diabetic peripheral angiopathy without gangrene: Secondary | ICD-10-CM | POA: Diagnosis not present

## 2022-04-09 DIAGNOSIS — G2581 Restless legs syndrome: Secondary | ICD-10-CM | POA: Diagnosis present

## 2022-04-09 DIAGNOSIS — I2510 Atherosclerotic heart disease of native coronary artery without angina pectoris: Secondary | ICD-10-CM | POA: Diagnosis present

## 2022-04-09 DIAGNOSIS — E1140 Type 2 diabetes mellitus with diabetic neuropathy, unspecified: Secondary | ICD-10-CM | POA: Diagnosis present

## 2022-04-09 DIAGNOSIS — J9601 Acute respiratory failure with hypoxia: Secondary | ICD-10-CM | POA: Diagnosis not present

## 2022-04-09 DIAGNOSIS — I132 Hypertensive heart and chronic kidney disease with heart failure and with stage 5 chronic kidney disease, or end stage renal disease: Secondary | ICD-10-CM | POA: Diagnosis present

## 2022-04-09 DIAGNOSIS — I731 Thromboangiitis obliterans [Buerger's disease]: Secondary | ICD-10-CM | POA: Diagnosis not present

## 2022-04-09 DIAGNOSIS — G8929 Other chronic pain: Secondary | ICD-10-CM | POA: Diagnosis present

## 2022-04-09 DIAGNOSIS — I714 Abdominal aortic aneurysm, without rupture, unspecified: Secondary | ICD-10-CM | POA: Diagnosis present

## 2022-04-09 DIAGNOSIS — E876 Hypokalemia: Secondary | ICD-10-CM | POA: Diagnosis not present

## 2022-04-09 DIAGNOSIS — R109 Unspecified abdominal pain: Secondary | ICD-10-CM | POA: Diagnosis present

## 2022-04-09 DIAGNOSIS — Z885 Allergy status to narcotic agent status: Secondary | ICD-10-CM

## 2022-04-09 DIAGNOSIS — Z974 Presence of external hearing-aid: Secondary | ICD-10-CM

## 2022-04-09 DIAGNOSIS — I700 Atherosclerosis of aorta: Secondary | ICD-10-CM | POA: Diagnosis not present

## 2022-04-09 DIAGNOSIS — Z833 Family history of diabetes mellitus: Secondary | ICD-10-CM

## 2022-04-09 DIAGNOSIS — Z7982 Long term (current) use of aspirin: Secondary | ICD-10-CM

## 2022-04-09 DIAGNOSIS — M898X9 Other specified disorders of bone, unspecified site: Secondary | ICD-10-CM | POA: Diagnosis present

## 2022-04-09 DIAGNOSIS — K589 Irritable bowel syndrome without diarrhea: Secondary | ICD-10-CM | POA: Diagnosis present

## 2022-04-09 DIAGNOSIS — Z9049 Acquired absence of other specified parts of digestive tract: Secondary | ICD-10-CM

## 2022-04-09 DIAGNOSIS — Z6832 Body mass index (BMI) 32.0-32.9, adult: Secondary | ICD-10-CM

## 2022-04-09 DIAGNOSIS — K760 Fatty (change of) liver, not elsewhere classified: Secondary | ICD-10-CM | POA: Diagnosis present

## 2022-04-09 DIAGNOSIS — I252 Old myocardial infarction: Secondary | ICD-10-CM

## 2022-04-09 LAB — CBC WITH DIFFERENTIAL/PLATELET
Abs Immature Granulocytes: 0.06 10*3/uL (ref 0.00–0.07)
Basophils Absolute: 0.1 10*3/uL (ref 0.0–0.1)
Basophils Relative: 1 %
Eosinophils Absolute: 0.1 10*3/uL (ref 0.0–0.5)
Eosinophils Relative: 1 %
HCT: 35.6 % — ABNORMAL LOW (ref 39.0–52.0)
Hemoglobin: 12.2 g/dL — ABNORMAL LOW (ref 13.0–17.0)
Immature Granulocytes: 1 %
Lymphocytes Relative: 9 %
Lymphs Abs: 0.9 10*3/uL (ref 0.7–4.0)
MCH: 35.9 pg — ABNORMAL HIGH (ref 26.0–34.0)
MCHC: 34.3 g/dL (ref 30.0–36.0)
MCV: 104.7 fL — ABNORMAL HIGH (ref 80.0–100.0)
Monocytes Absolute: 1 10*3/uL (ref 0.1–1.0)
Monocytes Relative: 10 %
Neutro Abs: 8.4 10*3/uL — ABNORMAL HIGH (ref 1.7–7.7)
Neutrophils Relative %: 78 %
Platelets: 175 10*3/uL (ref 150–400)
RBC: 3.4 MIL/uL — ABNORMAL LOW (ref 4.22–5.81)
RDW: 14.3 % (ref 11.5–15.5)
WBC: 10.5 10*3/uL (ref 4.0–10.5)
nRBC: 0 % (ref 0.0–0.2)

## 2022-04-09 LAB — COMPREHENSIVE METABOLIC PANEL
ALT: 20 U/L (ref 0–44)
AST: 19 U/L (ref 15–41)
Albumin: 3.1 g/dL — ABNORMAL LOW (ref 3.5–5.0)
Alkaline Phosphatase: 75 U/L (ref 38–126)
Anion gap: 9 (ref 5–15)
BUN: 44 mg/dL — ABNORMAL HIGH (ref 8–23)
CO2: 28 mmol/L (ref 22–32)
Calcium: 9.9 mg/dL (ref 8.9–10.3)
Chloride: 96 mmol/L — ABNORMAL LOW (ref 98–111)
Creatinine, Ser: 6.03 mg/dL — ABNORMAL HIGH (ref 0.61–1.24)
GFR, Estimated: 9 mL/min — ABNORMAL LOW (ref >60)
Glucose, Bld: 124 mg/dL — ABNORMAL HIGH (ref 70–99)
Potassium: 3.2 mmol/L — ABNORMAL LOW (ref 3.5–5.1)
Sodium: 133 mmol/L — ABNORMAL LOW (ref 135–145)
Total Bilirubin: 0.6 mg/dL (ref 0.3–1.2)
Total Protein: 6.8 g/dL (ref 6.5–8.1)

## 2022-04-09 LAB — BLOOD GAS, VENOUS
Acid-Base Excess: 7.5 mmol/L — ABNORMAL HIGH (ref 0.0–2.0)
Bicarbonate: 33.2 mmol/L — ABNORMAL HIGH (ref 20.0–28.0)
Drawn by: 442
O2 Saturation: 47.4 %
Patient temperature: 37.3
pCO2, Ven: 51 mmHg (ref 44–60)
pH, Ven: 7.43 (ref 7.25–7.43)
pO2, Ven: <31 mmHg — CL (ref 32–45)

## 2022-04-09 LAB — TROPONIN I (HIGH SENSITIVITY)
Troponin I (High Sensitivity): 37 ng/L — ABNORMAL HIGH (ref <18)
Troponin I (High Sensitivity): 38 ng/L — ABNORMAL HIGH (ref <18)

## 2022-04-09 LAB — PROTIME-INR
INR: 1 (ref 0.8–1.2)
Prothrombin Time: 13.4 seconds (ref 11.4–15.2)

## 2022-04-09 LAB — LIPASE, BLOOD: Lipase: 34 U/L (ref 11–51)

## 2022-04-09 LAB — RESP PANEL BY RT-PCR (FLU A&B, COVID) ARPGX2
Influenza A by PCR: NEGATIVE
Influenza B by PCR: NEGATIVE
SARS Coronavirus 2 by RT PCR: NEGATIVE

## 2022-04-09 LAB — LACTIC ACID, PLASMA
Lactic Acid, Venous: 1.5 mmol/L (ref 0.5–1.9)
Lactic Acid, Venous: 1.9 mmol/L (ref 0.5–1.9)

## 2022-04-09 LAB — AMMONIA: Ammonia: 20 umol/L (ref 9–35)

## 2022-04-09 LAB — CBG MONITORING, ED: Glucose-Capillary: 122 mg/dL — ABNORMAL HIGH (ref 70–99)

## 2022-04-09 LAB — ETHANOL: Alcohol, Ethyl (B): <10 mg/dL (ref <10)

## 2022-04-09 MED ORDER — HEPARIN SODIUM (PORCINE) 5000 UNIT/ML IJ SOLN
5000.0000 [IU] | Freq: Three times a day (TID) | INTRAMUSCULAR | Status: DC
  Administered 2022-04-09 – 2022-04-14 (×13): 5000 [IU] via SUBCUTANEOUS
  Filled 2022-04-09 (×13): qty 1

## 2022-04-09 MED ORDER — BISACODYL 5 MG PO TBEC
5.0000 mg | DELAYED_RELEASE_TABLET | Freq: Every day | ORAL | Status: DC | PRN
  Administered 2022-04-10 – 2022-04-13 (×2): 5 mg via ORAL
  Filled 2022-04-09 (×2): qty 1

## 2022-04-09 MED ORDER — LIDOCAINE 5 % EX PTCH
1.0000 | MEDICATED_PATCH | CUTANEOUS | Status: DC
  Administered 2022-04-10 – 2022-04-11 (×3): 1 via TRANSDERMAL
  Filled 2022-04-09 (×3): qty 1

## 2022-04-09 MED ORDER — RENA-VITE PO TABS
1.0000 | ORAL_TABLET | Freq: Every day | ORAL | Status: DC
  Administered 2022-04-10 – 2022-04-14 (×5): 1 via ORAL
  Filled 2022-04-09 (×6): qty 1

## 2022-04-09 MED ORDER — LACTATED RINGERS IV BOLUS (SEPSIS)
1000.0000 mL | Freq: Once | INTRAVENOUS | Status: AC
  Administered 2022-04-09: 1000 mL via INTRAVENOUS

## 2022-04-09 MED ORDER — ATORVASTATIN CALCIUM 40 MG PO TABS
40.0000 mg | ORAL_TABLET | Freq: Every day | ORAL | Status: DC
  Administered 2022-04-10 – 2022-04-14 (×5): 40 mg via ORAL
  Filled 2022-04-09 (×5): qty 1

## 2022-04-09 MED ORDER — SENNOSIDES-DOCUSATE SODIUM 8.6-50 MG PO TABS
2.0000 | ORAL_TABLET | Freq: Every day | ORAL | Status: DC
  Administered 2022-04-09 – 2022-04-10 (×2): 2 via ORAL
  Filled 2022-04-09 (×2): qty 2

## 2022-04-09 MED ORDER — SERTRALINE HCL 50 MG PO TABS
25.0000 mg | ORAL_TABLET | Freq: Every day | ORAL | Status: DC
  Administered 2022-04-10 – 2022-04-14 (×5): 25 mg via ORAL
  Filled 2022-04-09 (×5): qty 1

## 2022-04-09 MED ORDER — RISPERIDONE 0.5 MG PO TABS
0.5000 mg | ORAL_TABLET | Freq: Every day | ORAL | Status: DC
  Administered 2022-04-09 – 2022-04-13 (×5): 0.5 mg via ORAL
  Filled 2022-04-09 (×7): qty 1

## 2022-04-09 MED ORDER — INSULIN ASPART 100 UNIT/ML IJ SOLN: 0.0000 [IU] | Freq: Three times a day (TID) | INTRAMUSCULAR | Status: DC

## 2022-04-09 MED ORDER — PANTOPRAZOLE SODIUM 40 MG PO TBEC
40.0000 mg | DELAYED_RELEASE_TABLET | Freq: Every day | ORAL | Status: DC
  Administered 2022-04-10 – 2022-04-14 (×5): 40 mg via ORAL
  Filled 2022-04-09 (×5): qty 1

## 2022-04-09 MED ORDER — MELATONIN 5 MG PO TABS
5.0000 mg | ORAL_TABLET | Freq: Every day | ORAL | Status: DC
  Administered 2022-04-09 – 2022-04-13 (×5): 5 mg via ORAL
  Filled 2022-04-09 (×5): qty 1

## 2022-04-09 MED ORDER — ACETAMINOPHEN 325 MG PO TABS
650.0000 mg | ORAL_TABLET | Freq: Four times a day (QID) | ORAL | Status: DC | PRN
  Administered 2022-04-13: 650 mg via ORAL
  Filled 2022-04-09: qty 2

## 2022-04-09 MED ORDER — ROPINIROLE HCL 0.5 MG PO TABS
1.0000 mg | ORAL_TABLET | Freq: Every day | ORAL | Status: DC
  Administered 2022-04-10 – 2022-04-13 (×5): 1 mg via ORAL
  Filled 2022-04-09 (×3): qty 2
  Filled 2022-04-09: qty 1
  Filled 2022-04-09: qty 2

## 2022-04-09 MED ORDER — IPRATROPIUM-ALBUTEROL 0.5-2.5 (3) MG/3ML IN SOLN
3.0000 mL | Freq: Once | RESPIRATORY_TRACT | Status: AC
  Administered 2022-04-09: 3 mL via RESPIRATORY_TRACT
  Filled 2022-04-09: qty 3

## 2022-04-09 MED ORDER — ASPIRIN 81 MG PO TBEC
81.0000 mg | DELAYED_RELEASE_TABLET | Freq: Every day | ORAL | Status: DC
  Administered 2022-04-10 – 2022-04-14 (×5): 81 mg via ORAL
  Filled 2022-04-09 (×5): qty 1

## 2022-04-09 MED ORDER — SENNOSIDES-DOCUSATE SODIUM 8.6-50 MG PO TABS: 1.0000 | ORAL_TABLET | Freq: Every evening | ORAL | Status: DC | PRN

## 2022-04-09 MED ORDER — ACETAMINOPHEN 650 MG RE SUPP: 650.0000 mg | Freq: Four times a day (QID) | RECTAL | Status: DC | PRN

## 2022-04-09 MED ORDER — CLOPIDOGREL BISULFATE 75 MG PO TABS
75.0000 mg | ORAL_TABLET | Freq: Every day | ORAL | Status: DC
  Administered 2022-04-10 – 2022-04-14 (×5): 75 mg via ORAL
  Filled 2022-04-09 (×5): qty 1

## 2022-04-09 NOTE — H&P (Cosign Needed Addendum)
Date: 04/09/2022               Patient Name:  [name] MRN: [id number]  DOB: [birth year] Age / Sex: 71 y.o., male   PCP: Tobe Sos, MD         Medical Service: Internal Medicine Teaching Service         Attending Physician: Dr. Campbell Riches, MD    First Contact: Dr. Jeanett Schlein Pager: [phone]  Second Contact: Dr. 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   Pt presented with abdominal pain, back pain, generalized weakness, fatigue and SOB for 2 days. He reports started to experience abdominal pain, sharp in nature, non radiating, gradually got worse. Denies fever, N/V/D but chills. Pt endorses constipation but has bowel movements every 2 days at baseline. Has some headache and dizziness but denies neck pain, photophobia or change in vision. He also endorse SOB which is worse with physical activity. Denies chest pain, palpitations, diaphoresis, PND or orthopnea.  He reports he had a back injury about a year ago after the fall while coming out of the dialysis center. He see pain management for his back pain. He underwent nerve block injections in his lower back last week Tuesday. It resolved back pain for about a day. Pain has since recurred. Denies radiation of back pain to legs. Denies changes in urinary and bowel habits  He started peritoneal dialysis one year ago for ESRD secondary to diabetes. His abdominal pain gradually got worse and he was not able to perform dialysis last night. For worsening abdominal pain he presented to [facility] ED for evaluation. Concerns for peritonitis from his peritoneal dialysis pt were transferred to [facility]  for further evaluation.   Review of Systems negative unless stated in the HPI.  ED Course:  CBC: WBC: 10.5, Hb: 12.2, Hct: 35.6, Platelets: 175 CMP: NA: 133, K 3.2, BUN: 44, Scr: 6.03, Albumin 3.1  Trop 37 and 38 Lactic Acid: 1.5 and 1.9 SARS: Negative Influenza A&B: Negative  CXR: Mild bibasilar subsegmental atelectasis or scarring.   CT Abdomen/Pelvis: Lower chest: Bibasilar atelectasis.No acute intra-abdominal or intrapelvic abnormalities.   Ct Head: No evidence of acute intracranial abnormality.    Past Medical History: Past Medical History:  Diagnosis Date   AAA (abdominal aortic aneurysm) ([place]) 06/2016   Mild increase in size of 3.4 cm infrarenal abdominal aortic aneurysm   Anemia    Aortic atherosclerosis (HCC)    Arthritis    Asthma    Atrial flutter (HCC)    AVF (arteriovenous fistula) (HCC)    Left forearm   CAD (coronary artery disease)    a. s/p prior stenting of RCA in 1999 b. cath in 2003 showing moderate CAD c. NST in 2016 showing small area of ischemic and low-risk   CHF (congestive heart failure) ([place])    CKD (chronic kidney disease), stage V ([place])    kidney transplant evaluation   COPD (chronic obstructive pulmonary disease) ([place])    with ongoing tobacco use and patient failed sham takes   Diverticulosis 2018   Mild sigmoid colon diverticulosis.   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    Reflux esophagitis    hx   Two-vessel coronary artery  disease    moderate. by cath in 2003. Status post stenting of the mdi RCA November 1999 normal left ventricular ejection fraction   Wears glasses    Wears hearing aid    B/L    Meds:  Current Outpatient Medications  Medication Instructions   albuterol (VENTOLIN HFA) 108 (90 Base) MCG/ACT inhaler 2 puffs, Inhalation, Every 6 hours PRN   amLODipine (NORVASC) 10 mg, Oral, Daily   aspirin EC 81 mg, Oral, Daily   atorvastatin (LIPITOR) 40 MG tablet Take 1 tablet by mouth once daily   beclomethasone (QVAR) 80 MCG/ACT inhaler 2 puffs, Inhalation, 2 times daily   buPROPion (WELLBUTRIN XL) 150 mg, Oral, Daily   CALCIUM CITRATE PO 600 mg, Oral, 2 times daily   carvedilol (COREG) 25 mg, Oral, 2 times daily with meals   clobetasol (TEMOVATE) 0.05 % external solution 1 application , Topical, 2 times daily PRN   denosumab (PROLIA) 60 mg, Subcutaneous, Every 6 months   Ergocalciferol (CALCIFEROL PO) 0.25 mg, Oral, Daily   linaclotide (LINZESS) 290 mcg, Oral, Daily before breakfast   magnesium oxide (MAG-OX) 400 mg, Oral, Daily   Melatonin 20 mg, Oral, Daily at bedtime   multivitamin (RENA-VIT) TABS tablet 1 tablet, Oral, Daily   nitroGLYCERIN (NITROSTAT) 0.4 mg, Sublingual, Every 5 min PRN   pantoprazole (PROTONIX) 40 mg, Oral, Daily   Plavix 75 mg, Oral, Daily   Potassium Chloride ER 20 MEQ TBCR 20 mEq, Oral, Daily   potassium chloride SA (KLOR-CON M) 20 MEQ tablet 20 mEq, Oral, Daily   risperiDONE (RISPERDAL) 0.5 mg, Oral, Daily at bedtime   rOPINIRole (REQUIP) 1 mg, Oral, Daily   senna-docusate (SENOKOT-S) 8.6-50 MG tablet 2 tablets, Oral, At bedtime PRN   sertraline (ZOLOFT) 25 mg, Oral, Every morning   traMADol (ULTRAM) 50 mg, Oral, Every 4 hours PRN   Vitamin D3 2,000 Units, Oral, Daily     Allergies: Allergies as of 04/09/2022 - Review Complete 04/09/2022  Allergen Reaction Noted   Hydrocodone Itching 03/05/2021   Other  10/31/2020   Oxycodone-acetaminophen Other (See Comments)  03/05/2021    Past Surgical History: Past Surgical History:  Procedure Laterality Date   A/V FISTULAGRAM Left 04/30/2019   Procedure: A/V FISTULAGRAM;  Surgeon: Elam Dutch, MD;  Location: [place] [facility];  Service: Cardiovascular;  Laterality: Left;   A/V FISTULAGRAM Left 09/22/2019   Procedure: A/V FISTULAGRAM;  Surgeon: Marty Heck, MD;  Location: [place] [facility];  Service: Cardiovascular;  Laterality: Left;   ABDOMINAL SURGERY     APPENDECTOMY     AV FISTULA PLACEMENT Right 03/11/2018   Procedure: CREATION Brachiocephalic Fistula RIGHT ARM;  Surgeon: Rosetta Posner, MD;  Location: [place];  Service: Vascular;  Laterality: Right;   AV FISTULA PLACEMENT Left 05/06/2019   Procedure:  LEFT BRACHIOCEPHALIC ARTERIOVENOUS (AV) FISTULA CREATION;  Surgeon: Marty Heck, MD;  Location: [place];  Service: Vascular;  Laterality: Left;   BACK SURGERY     x3; cages in patient's back since 2000   BIOPSY  11/12/2018   Procedure: BIOPSY;  Surgeon: Laurence Spates, MD;  Location: [facility];  Service: Endoscopy;;   CARDIAC CATHETERIZATION     stent   CHOLECYSTECTOMY     CLOSED REDUCTION SHOULDER DISLOCATION     COLONOSCOPY W/ BIOPSIES AND POLYPECTOMY     COLONOSCOPY WITH PROPOFOL N/A 11/12/2018   Procedure: COLONOSCOPY WITH PROPOFOL;  Surgeon: Laurence Spates, MD;  Location: [facility];  Service: Endoscopy;  Laterality: N/A;   COLONOSCOPY WITH PROPOFOL N/A 10/02/2021   Procedure: COLONOSCOPY WITH PROPOFOL;  Surgeon: Clarene Essex, MD;  Location: [facility];  Service: Gastroenterology;  Laterality: N/A;   CORONARY ANGIOPLASTY     CYSTOSCOPY WITH INSERTION OF UROLIFT N/A 11/06/2020   Procedure: CYSTOSCOPY WITH INSERTION OF UROLIFT;  Surgeon: Cleon Gustin, MD;  Location: [facility];  Service: Urology;  Laterality: N/A;   diabetic ulcers     DIAGNOSTIC LAPAROSCOPY     DIALYSIS/PERMA CATHETER REMOVAL N/A 09/22/2019   Procedure: DIALYSIS/PERMA CATHETER REMOVAL;  Surgeon:  Marty Heck, MD;  Location: [place] [facility];  Service: Cardiovascular;  Laterality: N/A;   ESOPHAGOGASTRODUODENOSCOPY (EGD) WITH PROPOFOL N/A 11/12/2018   Procedure: ESOPHAGOGASTRODUODENOSCOPY (EGD) WITH PROPOFOL;  Surgeon: Laurence Spates, MD;  [facility];  Service: Endoscopy;  Laterality: N/A;   GROIN EXPLORATION     HEMOSTASIS CLIP PLACEMENT  11/12/2018   Procedure: HEMOSTASIS CLIP PLACEMENT;  Surgeon: Laurence Spates, MD;  [facility];  Service: Endoscopy;;   HIP ARTHROPLASTY Left    gamma nail   INSERT / REPLACE / [place] (AV) ARTEGRAFT ARM Left 03/18/2018   Procedure: INSERTION OF ARTERIOVENOUS (AV) FISTULA;  Surgeon: Rosetta Posner, MD;  Location: [place];  Service: Vascular;  Laterality: Left;   IR FLUORO GUIDE CV LINE RIGHT  04/11/2019   IR US GUIDE VASC ACCESS RIGHT  04/11/2019   LEFT HEART CATH AND CORONARY ANGIOGRAPHY N/A 07/23/2019   Procedure: LEFT HEART CATH AND CORONARY ANGIOGRAPHY;  Surgeon: Martinique, Peter M, MD;  Location: [place] [facility];  Service: Cardiovascular;  Laterality: N/A;   LEFT HEART CATH AND CORONARY ANGIOGRAPHY N/A 10/10/2021   Procedure: LEFT HEART CATH AND CORONARY ANGIOGRAPHY;  Surgeon: [name], MD;  Location: [place] [facility];  Service: Cardiovascular;  Laterality: N/A;   LIGATION ARTERIOVENOUS GORTEX GRAFT Right 03/18/2018   Procedure: LIGATION ARTERIOVENOUS FISTULA;  Surgeon: Rosetta Posner, MD;  [facility];  Service: Vascular;  Laterality: Right;   OSTECTOMY Left 04/23/2017   Procedure: OSTECTOMY LEFT GREAT TOE;  Surgeon: [name], DPM;  Location: [facility];  Service: Podiatry;  Laterality: Left;  left great toe   POLYPECTOMY  11/12/2018   Procedure: POLYPECTOMY;  Surgeon: Laurence Spates, MD;  [facility];  Service: Endoscopy;;   POLYPECTOMY  10/02/2021   Procedure: POLYPECTOMY;  Surgeon: Clarene Essex, MD;  Location: [facility];  Service:  Gastroenterology;;   SPINAL CORD STIMULATOR INSERTION N/A 12/20/2020   Procedure: Permanent Spinal Cord Stimulator  Lead Implant with Fluoroscopy and Battery Implant;  Surgeon: Reece Agar, MD;  Location: [place];  Service: Neurosurgery;  Laterality: N/A;   TUMOR REMOVAL     small intestine x3   VISCERAL ANGIOGRAPHY N/A 08/02/2019   Procedure:  VISCERAL ANGIOGRAPHY;  Surgeon: Waynetta Sandy, MD;  Location: [place] [facility];  Service: Cardiovascular;  Laterality: N/A;    Family History:  Family History  Problem Relation Age of Onset   Diabetes Mother    Alcohol abuse Father     Social History:  Can walk with walker. And needs help with ADLs and iADLs. Former smoker but quit 2 years ago. No alcohol use.  Social History   Socioeconomic History   Marital status: Married    Spouse name: Not on file   Number of children: 2   Years of education: Not on file   Highest education level: Not on file  Occupational History   Occupation: retired Engineer, structural  Tobacco Use   Smoking status: Former    Years: 40.00    Types: Cigarettes    Start date: 05/20/1968    Quit date: 11/30/2019    Years since quitting: 2.3   Smokeless tobacco: Never  Vaping Use   Vaping Use: Never used  Substance and Sexual Activity   Alcohol use: Not Currently    Alcohol/week: 0.0 standard drinks of alcohol    Comment: rare   Drug use: No   Sexual activity: Yes  Other Topics Concern   Not on file  Social History Narrative   Patient continues to smoke.    Social Determinants of Health   Financial Resource Strain: Not on file  Food Insecurity: Not on file  Transportation Needs: Not on file  Physical Activity: Not on file  Stress: Not on file  Social Connections: Not on file  Intimate Partner Violence: Not on file     Physical Exam: Blood pressure (!) 151/71, pulse 70, temperature 98.3 F (36.8 C), temperature source Oral, resp. rate (!) 22, height '6\' 4"'$  (1.93 m), weight 122.5 kg, SpO2 97  %.  Physical Exam Vitals and nursing note reviewed.  Constitutional:      Appearance: Normal appearance. He is obese.  HENT:     Head: Normocephalic and atraumatic.  Eyes:     Conjunctiva/sclera: Conjunctivae normal.     Pupils: Pupils are equal, round, and reactive to light.  Cardiovascular:     Rate and Rhythm: Normal rate and regular rhythm.  Pulmonary:     Effort: Pulmonary effort is normal. No respiratory distress.     Breath sounds: Normal breath sounds. No wheezing.  Abdominal:     General: Bowel sounds are normal. There is no distension.     Tenderness: There is abdominal tenderness. There is no guarding.  Musculoskeletal:        General: Tenderness (Diffuse midline tenderness. No visible deformity) present.       Arms:     Cervical back: Neck supple.     Right lower leg: No edema.     Left lower leg: No edema.     Comments: Diffuse midline TTP  Skin:    General: Skin is warm.     Capillary Refill: Capillary refill takes less than 2 seconds.  Neurological:     Mental Status: He is alert.  Psychiatric:        Mood and Affect: Mood normal.        Behavior: Behavior normal.      Diagnostics:     Latest Ref Rng & Units 04/09/2022   10:36 AM 10/11/2021    1:44 AM 10/10/2021    1:26 AM  CBC  WBC 4.0 - 10.5 K/uL 10.5  10.5  13.4   Hemoglobin 13.0 -  17.0 g/dL 12.2  12.1  13.0   Hematocrit 39.0 - 52.0 % 35.6  35.0  38.2   Platelets 150 - 400 K/uL 175  163  162        Latest Ref Rng & Units 04/09/2022   10:36 AM 10/11/2021    1:44 AM 10/10/2021    1:26 AM  CMP  Glucose 70 - 99 mg/dL 124  245  280   BUN 8 - 23 mg/dL 44  62  56   Creatinine 0.61 - 1.24 mg/dL 6.03  5.36  5.51   Sodium 135 - 145 mmol/L 133  135  133   Potassium 3.5 - 5.1 mmol/L 3.2  3.5  3.9   Chloride 98 - 111 mmol/L 96  98  99   CO2 22 - 32 mmol/L '28  26  26   '$ Calcium 8.9 - 10.3 mg/dL 9.9  8.8  9.0   Total Protein 6.5 - 8.1 g/dL 6.8  5.7    Total Bilirubin 0.3 - 1.2 mg/dL 0.6  0.5     Alkaline Phos 38 - 126 U/L 75  55    AST 15 - 41 U/L 19  21    ALT 0 - 44 U/L 20  20      CT Head Wo Contrast  Result Date: 04/09/2022 IMPRESSION: 1. No evidence of acute intracranial abnormality. 2. Parenchymal atrophy and chronic small vessel ischemic disease, as described. 3. Paranasal sinus disease at the imaged levels, as described. Electronically Signed   By: Kellie Simmering D.O.   On: 04/09/2022 15:05   Result Date: 04/09/2022  EXAM: CT ABDOMEN AND PELVIS WITHOUT CONTRAST TECHNIQUE:  IMPRESSION: Peritoneal dialysis catheter with ascites/dialysate.  No acute intra-abdominal or intrapelvic abnormalities.   BILATERAL renal cortical thinning with tiny nonobstructing BILATERAL renal calculi. Stable 18 x 15 mm exophytic nodule upper pole LEFT kidney, 22 x 15 mm. Stable aneurysmal dilatation of distal abdominal aorta 3.7 x 3.7 cm; recommend follow-up every 2 years. Reference: J Am Coll Radiol 9371;69:678-938. Extensive atherosclerotic disease changes including coronary arteries and origins of visceral arteries. Aortic Atherosclerosis (ICD10-I70.0). Aortic aneurysm NOS (ICD10-I71.9). Electronically Signed   By: Lavonia Dana M.D.   On: 04/09/2022 13:31    DG Chest Port 1 View  Result Date: 04/09/2022 IMPRESSION: Mild bibasilar subsegmental atelectasis or scarring.    EKG: personally reviewed my interpretation is Junctional Rhythm. No acute ischemic changes  CXR: personally reviewed my interpretation is:  Mild bibasilar subsegmental atelectasis or scarring.   71 y/o male with h/o HTN, DM, CAD, atrial flutter, ESRD (on peritoneal dialysis) HLD, anxiety, OSA, COPD, osteomyelitis, Buerger's disease,and  pacemaker placement presented with generalized weakness, abdominal pain and back pain admitted for suspected peritonitis.  Assessment & Plan by Problem:  #Generalized weakness #Fatigue #Abdominal pain Patient presented with generalized weakness, increase fatigue, abdominal pain started day  before yesterday and gradually got worse. Reports pain sharp in nature, nonradiating, no associated fever, nausea, vomiting, or diarrhea. Pt perform peritoneal dialysis every night however due to abdominal pain he was unable to do peritoneal dialysis yesterday.  Patient reports he is having generalized weakness and fatigue, with some shortness of breath.  He denies fever, nausea, vomiting. Denies chest pain, palpitations, dizziness or diaphoresis.  Abdomen is soft, non-distended, diffusely tender to deep palpation. No guarding or rebound tenderness.  No peritoneal signs. Normal white count and pt remains afebrile. CT studiy demonstrated peritoneal dialysis catheter with ascites/dialysate which is further reassuring.    After  performing the thorough physical exam and reviewing the imaging studies findings consistent with ascites. However there is suspicion for peritonitis likely secondary to peritoneal dialysis.   Patient also reports he received COVID, flu and RSV injections yesterday.  Mild fatigue and generalized weakness could be from the administration of multiple vaccination.Nephrology was consulted for further recommendations. They advised peritoneal fluid analysis for further work-up. Antibiotic treatment not recommended for ascites at this time -Nephrology consulted; will follow  -Follow up peritoneal studies -Monitor s/s of infection  #SOB Patient also complaining of shortness of breath for few days.  He denies chest pain, palpitations, diaphoresis. Lungs CTA. No wheeze, rales or rhonchi appreciated to auscultation. No pedal edema and no signs of volume overload. Troponin were mildly elevated 37 and 38.  Highly likely from demand ischemia.SOB likely from ascites pushing the diaphragm resulting in SOB.  EKG Normal Sinus with junctional rhythm. No acute ischemic changes. -CTM  #HTN #LVH Chronic. Hold for now until PO intake improves. Restart home meds gradually. Last echo in 09/2021 showed left  ventricular ejection fraction, by estimation, is 55 to 60%.   #Back pain  Pt have chronic back pain resulted after fall a year ago. Pt see pain management, He denies radiation of back pain. Denies changes in urinary or bowel habits. He received nerve block injection last week for worsening lower back pain. Pain inproved for a day and returned back. -PT/OT -Lidocaine patch  #HLD #CAD Chronic. Will restart statin, aspirin and plavix.   #DMII   Chronic. Last A1c was 5.7 in 09/2021. Repeat A1c pending.  -SSI with sensitive scale.   DVT prophx:Heparin Diet:HH Bowel: PRN Code:Full  Prior to Admission Living Arrangement: Home Anticipated Discharge Location: Home Barriers to Discharge: Medical Workup  Dispo: Admit patient to Observation with expected length of stay less than 2 midnights.

## 2022-04-09 NOTE — ED Triage Notes (Signed)
"  He had shots in his back last week for pain and they have worn off so back is hurting him, but he is also just not his normal self. Very weak, shortness of breath and some confusion on and off. I gave him his peritoneal dialysis this morning, but he has not had any medications yet" per spouse.  Upon arrival patient is oriented x 4, lethargic, generalized weakness, required 2 person assist to pivot from wheel chair to bed. Incontinent of urine.

## 2022-04-09 NOTE — ED Notes (Addendum)
Pt arrived as a transfer from [facility] ED. Pt is AAOx2, disoriented to time.  Vital signs stable.  No distress noted.

## 2022-04-09 NOTE — ED Notes (Signed)
Patient transported to CT 

## 2022-04-09 NOTE — ED Provider Notes (Signed)
  Physical Exam  BP 135/68 (BP Location: Right Wrist)   Pulse (!) 59   Temp 98.9 F (37.2 C) (Oral)   Resp (!) 24   Ht '6\' 4"'$  (1.93 m)   Wt 122.5 kg   SpO2 95%   BMI 32.87 kg/m   Physical Exam  Procedures  Procedures  ED Course / MDM    Medical Decision Making Amount and/or Complexity of Data Reviewed Labs: ordered. Radiology: ordered. ECG/medicine tests: ordered.  Risk Prescription drug management.   Received patient in signout.  Abdominal pain.  Weakness.  Worsened since injection on back.  Also does peritoneal dialysis.  Had been seen at [facility].  CT scan reassuring.  However worry for a peritonitis from his peritoneal dialysis.  Was at any pain and they were unable to get fluid up there.  Will bring down here.  However still unable to ambulate.  To generally weak.  I think even with negative fluid he will require admission to the hospital for further work-up.  Also was having back pain.  Will discuss with unassigned medicine.       Davonna Belling, MD 04/09/22 [phone]

## 2022-04-09 NOTE — ED Provider Notes (Signed)
[facility] EMERGENCY DEPARTMENT Provider Note   CSN: [id number] Arrival date & time: 04/09/22  5035     History  Chief Complaint  Patient presents with   Altered Mental Status    [name] is a 71 y.o. male.  HPI Patient presents for generalized weakness and altered mental status.  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  History is provided by patient's wife.  She reports that he had a back injury recently.  5 days ago, he underwent nerve block injections in his back.  This resolved his back pain for about a day.  Pain has since recurred.  Starting yesterday, she noticed some confusion.  He did get seen by his kidney doctor yesterday.  Yesterday afternoon, he did endorse some lower abdominal pain.  He did undergo peritoneal dialysis overnight.  Dwell appeared clean this morning.  Today, patient's confusion seems to have worsened.  He also has severe generalized weakness and is unable to stand or walk.  Patient currently endorses some pain in his lower abdomen which he states has improved since yesterday.  He denies any other current areas of discomfort.  Patient does continue to make urine.  Patient's wife noticed an episode of shaking last night.  Temperature at the time was 99.6 degrees.  Wife reports that he was up throughout the night and seemed to be talking to himself.  Patient last ate yesterday at lunchtime.  He has not had any vomiting or diarrhea.  He did not take any of his morning medications.    Home Medications Prior to Admission medications   Medication Sig Start Date End Date Taking? Authorizing Provider  albuterol (VENTOLIN HFA) 108 (90 Base) MCG/ACT inhaler Inhale 2 puffs into the lungs every 6 (six) hours as needed for wheezing or shortness of breath.     [provider]  amLODipine (NORVASC) 10 MG tablet Take 1 tablet (10 mg  total) by mouth daily. 10/22/21   Arnoldo Lenis, MD  aspirin EC 81 MG EC tablet Take 1 tablet (81 mg total) by mouth daily. 07/25/19   Isaac Bliss, Rayford Halsted, MD  atorvastatin (LIPITOR) 40 MG tablet Take 1 tablet by mouth once daily 03/11/22   Arnoldo Lenis, MD  beclomethasone (QVAR) 80 MCG/ACT inhaler Inhale 2 puffs into the lungs in the morning and at bedtime.    [provider]  CALCIUM CITRATE PO Take 600 mg by mouth in the morning and at bedtime.    [provider]  carvedilol (COREG) 25 MG tablet Take 1 tablet (25 mg total) by mouth 2 (two) times daily with a meal. 09/13/21   Branch, Alphonse Guild, MD  Cholecalciferol (VITAMIN D3) 2000 units TABS Take 2,000 Units by mouth daily.     [provider]  clobetasol (TEMOVATE) 0.05 % external solution Apply 1 application topically 2 (two) times daily as needed (itching).    [provider]  denosumab (PROLIA) 60 MG/ML SOSY injection Inject 60 mg into the skin every 6 (six) months.    [provider]  Ergocalciferol (CALCIFEROL PO) Take 0.25 mg by mouth daily.    [provider]  linaclotide (LINZESS) 290 MCG CAPS capsule Take 290 mcg by mouth daily before breakfast.    [provider]  magnesium oxide (MAG-OX) 400 MG tablet Take 400 mg by mouth daily.    [provider]  Melatonin 10 MG TABS  Take 20 mg by mouth at bedtime.    [provider]  multivitamin (RENA-VIT) TABS tablet Take 1 tablet by mouth daily.    [provider]  nitroGLYCERIN (NITROSTAT) 0.4 MG SL tablet Place 1 tablet (0.4 mg total) under the tongue every 5 (five) minutes as needed for chest pain. 10/22/21   Arnoldo Lenis, MD  pantoprazole (PROTONIX) 40 MG tablet Take 1 tablet (40 mg total) by mouth daily. 10/11/21 10/11/22  Darliss Cheney, MD  Potassium Chloride ER 20 MEQ TBCR Take 20 mEq by mouth daily. 09/28/20   [provider]  risperiDONE (RISPERDAL) 0.5 MG tablet Take 0.5 mg  by mouth at bedtime.    [provider]  rOPINIRole (REQUIP) 1 MG tablet Take 1 mg by mouth daily. 03/02/21   [provider]  senna-docusate (SENOKOT-S) 8.6-50 MG tablet Take 2 tablets by mouth at bedtime as needed for mild constipation.    [provider]  sertraline (ZOLOFT) 25 MG tablet Take 25 mg by mouth in the morning. 09/22/20   [provider]  traMADol (ULTRAM) 50 MG tablet Take 50 mg by mouth every 4 (four) hours as needed for moderate pain. 09/13/21   [provider]      Allergies    Hydrocodone, Other, and Oxycodone-acetaminophen    Review of Systems   Review of Systems  Constitutional:  Positive for activity change, appetite change, chills and fatigue.  Gastrointestinal:  Positive for abdominal pain.  Neurological:  Positive for weakness (Generalized).  All other systems reviewed and are negative.   Physical Exam Updated Vital Signs BP (!) 143/78   Pulse 70   Temp 98.1 F (36.7 C) (Rectal)   Resp 18   Ht '6\' 4"'$  (1.93 m)   Wt 122.5 kg   SpO2 92%   BMI 32.87 kg/m  Physical Exam Vitals and nursing note reviewed.  Constitutional:      General: He is not in acute distress.    Appearance: He is well-developed. He is ill-appearing. He is not toxic-appearing or diaphoretic.  HENT:     Head: Normocephalic and atraumatic.     Right Ear: External ear normal.     Left Ear: External ear normal.     Nose: Nose normal.     Mouth/Throat:     Mouth: Mucous membranes are moist.     Pharynx: Oropharynx is clear.  Eyes:     Extraocular Movements: Extraocular movements intact.     Conjunctiva/sclera: Conjunctivae normal.  Cardiovascular:     Rate and Rhythm: Normal rate and regular rhythm.     Heart sounds: No murmur heard. Pulmonary:     Effort: Pulmonary effort is normal. No respiratory distress.     Breath sounds: Wheezing present. No rhonchi or rales.  Chest:     Chest wall: No tenderness.  Abdominal:     General: There is  no distension.     Palpations: Abdomen is soft.     Tenderness: There is no abdominal tenderness.     Comments: PD catheter in place without surrounding erythema or tenderness  Musculoskeletal:        General: No swelling or deformity.     Cervical back: Normal range of motion and neck supple.     Right lower leg: Edema present.     Left lower leg: Edema present.  Skin:    General: Skin is warm and dry.     Capillary Refill: Capillary refill takes less than 2 seconds.  Coloration: Skin is not jaundiced or pale.  Neurological:     General: No focal deficit present.     Mental Status: He is alert.     Cranial Nerves: No cranial nerve deficit.     Sensory: No sensory deficit.     Motor: No weakness.     Coordination: Coordination normal.  Psychiatric:        Mood and Affect: Mood normal.        Behavior: Behavior normal.     ED Results / Procedures / Treatments   Labs (all labs ordered are listed, but only abnormal results are displayed) Labs Reviewed  COMPREHENSIVE METABOLIC PANEL - Abnormal; Notable for the following components:      Result Value   Sodium 133 (*)    Potassium 3.2 (*)    Chloride 96 (*)    Glucose, Bld 124 (*)    BUN 44 (*)    Creatinine, Ser 6.03 (*)    Albumin 3.1 (*)    GFR, Estimated 9 (*)    All other components within normal limits  CBC WITH DIFFERENTIAL/PLATELET - Abnormal; Notable for the following components:   RBC 3.40 (*)    Hemoglobin 12.2 (*)    HCT 35.6 (*)    MCV 104.7 (*)    MCH 35.9 (*)    Neutro Abs 8.4 (*)    All other components within normal limits  BLOOD GAS, VENOUS - Abnormal; Notable for the following components:   pO2, Ven <31 (*)    Bicarbonate 33.2 (*)    Acid-Base Excess 7.5 (*)    All other components within normal limits  TROPONIN I (HIGH SENSITIVITY) - Abnormal; Notable for the following components:   Troponin I (High Sensitivity) 37 (*)    All other components within normal limits  TROPONIN I (HIGH SENSITIVITY)  - Abnormal; Notable for the following components:   Troponin I (High Sensitivity) 38 (*)    All other components within normal limits  CULTURE, BLOOD (ROUTINE X 2)  RESP PANEL BY RT-PCR (FLU A&B, COVID) ARPGX2  CULTURE, BLOOD (ROUTINE X 2)  URINE CULTURE  BODY FLUID CULTURE W GRAM STAIN  LACTIC ACID, PLASMA  LACTIC ACID, PLASMA  PROTIME-INR  LIPASE, BLOOD  AMMONIA  ETHANOL  URINALYSIS, ROUTINE W REFLEX MICROSCOPIC  BODY FLUID CELL COUNT WITH DIFFERENTIAL    EKG EKG Interpretation  Date/Time:  Tuesday April 09 2022 10:08:01 EST Ventricular Rate:  62 PR Interval:    QRS Duration: 103 QT Interval:  427 QTC Calculation: 434 R Axis:   15 Text Interpretation: Junctional rhythm Anterior infarct, old Borderline repolarization abnormality Baseline wander in lead(s) V4 Confirmed by Godfrey Pick ([id number]) on 04/09/2022 11:09:51 AM  Radiology CT ABDOMEN PELVIS WO CONTRAST  Result Date: 04/09/2022 CLINICAL DATA:  Abdominal pain, nonlocalized, increased weakness and back pain, history of abdominal aortic aneurysm, diverticulosis, asthma, atrial flutter, coronary artery disease post MI, COPD, chronic kidney disease, type II diabetes mellitus, hypertension EXAM: CT ABDOMEN AND PELVIS WITHOUT CONTRAST TECHNIQUE: Multidetector CT imaging of the abdomen and pelvis was performed following the standard protocol without IV contrast. RADIATION DOSE REDUCTION: This exam was performed according to the departmental dose-optimization program which includes automated exposure control, adjustment of the mA and/or kV according to patient size and/or use of iterative reconstruction technique. COMPARISON:  10/08/2020 FINDINGS: Lower chest: Bibasilar atelectasis. Upper normal size of cardiac chambers with pacemaker leads RIGHT atrium and RIGHT ventricle. Coronary arterial calcifications noted. Hepatobiliary: Gallbladder surgically absent. Liver normal  appearance. Pancreas: Normal appearance Spleen: Normal appearance  Adrenals/Urinary Tract: Adrenal glands normal appearance. BILATERAL renal cortical thinning. Tiny calculi in both kidneys. Exophytic nodule upper pole LEFT kidney 18 x 15 mm image 27, stable size, 22 HU previously 30, stable. No additional renal mass, hydronephrosis, or hydroureter. Bladder unremarkable. Stomach/Bowel: Appendix not visualized. Stomach decompressed, with suboptimal assessment of proximal wall thickness. Large and small bowel loops unremarkable. Vascular/Lymphatic: Extensive atherosclerotic calcifications aorta, visceral arteries, iliac arteries, femoral arteries, coronary arteries. Fusiform aneurysmal dilatation distal abdominal aorta 3.7 x 3.7 cm unchanged, extending into aortic bifurcation with RIGHT common iliac artery 21 mm diameter and LEFT common iliac artery 16 mm diameter. Significant plaque at origins of celiac artery, SMA, and BILATERAL renal arteries. No adenopathy. Reproductive: Question prior urolift. Other: Peritoneal dialysis catheter with ascites/dialysate. No free air. No hernia. Musculoskeletal: Osseous demineralization. Prior lumbar fusion and LEFT femoral ORIF. Old compression fracture L2 with vertebroplasty. IMPRESSION: Peritoneal dialysis catheter with ascites/dialysate. BILATERAL renal cortical thinning with tiny nonobstructing BILATERAL renal calculi. Stable 18 x 15 mm exophytic nodule upper pole LEFT kidney, 22 x 15 mm. Stable aneurysmal dilatation of distal abdominal aorta 3.7 x 3.7 cm; recommend follow-up every 2 years. Reference: J Am Coll Radiol 9323;55:732-202. Extensive atherosclerotic disease changes including coronary arteries and origins of visceral arteries. No acute intra-abdominal or intrapelvic abnormalities. Aortic Atherosclerosis (ICD10-I70.0). Aortic aneurysm NOS (ICD10-I71.9). Electronically Signed   By: Lavonia Dana M.D.   On: 04/09/2022 13:31   DG Chest Port 1 View  Result Date: 04/09/2022 CLINICAL DATA:  Possible sepsis. EXAM: PORTABLE CHEST 1 VIEW  COMPARISON:  Oct 10, 2021. FINDINGS: Stable cardiomediastinal silhouette. Left-sided pacemaker is unchanged in position. Mild bibasilar opacities are noted concerning for subsegmental atelectasis or scarring. Bony thorax is unremarkable. IMPRESSION: Mild bibasilar subsegmental atelectasis or scarring. Electronically Signed   By: Marijo Conception M.D.   On: 04/09/2022 10:16    Procedures Procedures    Medications Ordered in ED Medications  lactated ringers bolus 1,000 mL (0 mLs Intravenous Stopped 04/09/22 1205)  ipratropium-albuterol (DUONEB) 0.5-2.5 (3) MG/3ML nebulizer solution 3 mL (3 mLs Nebulization Given 04/09/22 1039)    ED Course/ Medical Decision Making/ A&P                           Medical Decision Making Amount and/or Complexity of Data Reviewed Labs: ordered. Radiology: ordered. ECG/medicine tests: ordered.  Risk Prescription drug management.   This patient presents to the ED for concern of generalized weakness, confusion, abdominal pain, this involves an extensive number of treatment options, and is a complaint that carries with it a high risk of complications and morbidity.  The differential diagnosis includes peritonitis, pyelonephritis, metabolic derangements, polypharmacy, CVA   Co morbidities that complicate the patient evaluation  HTN, HLD, anxiety, OSA, COPD, osteomyelitis, Buerger's disease, DM, CAD, atrial flutter, ESRD (on peritoneal dialysis), pacemaker placement   Additional history obtained:  Additional history obtained from patient's wife External records from outside source obtained and reviewed including EMR   Lab Tests:  I Ordered, and personally interpreted labs.  The pertinent results include: High-normal WBC with neutrophilia; baseline anemia; elevated creatinine and BUN, consistent with ESRD, mild hypokalemia, slight elevation in troponin consistent with ESRD   Imaging Studies ordered:  I ordered imaging studies including CT of abdomen  and pelvis I independently visualized and interpreted imaging which showed no acute findings to explain patient's symptoms. I agree with the radiologist interpretation  Cardiac Monitoring: / EKG:  The patient was maintained on a cardiac monitor.  I personally viewed and interpreted the cardiac monitored which showed an underlying rhythm of: Sinus rhythm   Consultations Obtained:  I requested consultation with the neurologist, Dr. Moshe Cipro,  and discussed lab and imaging findings as well as pertinent plan - they recommend: Transfer to [facility] for peritoneal fluid analysis   Problem List / ED Course / Critical interventions / Medication management  Patient is a 71 year old male presenting for 2 days of generalized weakness, abdominal pain, confusion, and chills.  He presents to the ED with his wife, who provides history.  On arrival in the ED, patient is afebrile with normal heart rate and respiratory rate.  He is severely weak globally.  He requires 2 person assist from wheelchair to bed.  He has had chronic back pain and did undergo what his wife describes as paraspinal injections last week.  She reports that his back pain returned but patient denies any current back pain.  Patient endorses abdominal pain only.  He does have a peritoneal dialysis catheter in place.  There does not appear to be any surrounding erythema.  Abdomen is soft and there is only mild tenderness.  Physical exam is otherwise remarkable for diminished breath sounds and wheezing on lung auscultation.  Patient does have a history of COPD.  DuoNeb was ordered.  On blood gas, he has mild, compensated hypercarbia only.  Further laboratory work-up was initiated.  Patient continues to make adequate urine output and has had decreased p.o. intake.  1 L bolus IV fluids was given.  CT imaging of abdomen pelvis did not show any acute findings to explain his symptoms.  Lab work does show a left shift on CBC raising concern for  infection.  On check of rectal temperature, patient is normothermic.  CMP was unremarkable.  Patient has elevation in creatinine and BUN consistent with baseline lab work.  On reassessment, patient's wife, who remains at bedside, states that he continues to act confused.  He does endorse continued abdominal pain.  Currently, urinalysis is pending.  This could be a source of infection.  I also have concern of peritonitis.  I discussed this with nephrologist on-call, Dr. Moshe Cipro who states that there is no one at [facility] who can sample peritoneal fluid for analysis.  She does recommend transfer to [facility] for peritoneal fluid studies.  Given no current source of infection, no antibiotics are ordered at this time.  Patient was accepted in transfer by Dr. Billy Fischer.  Patient and wife are agreeable.  I ordered medication including IV fluids for decreased p.o. intake; DuoNeb for wheezing Reevaluation of the patient after these medicines showed that the patient improved I have reviewed the patients home medicines and have made adjustments as needed   Social Determinants of Health:  Has access to outpatient care         Final Clinical Impression(s) / ED Diagnoses Final diagnoses:  Confusion  Generalized weakness  Periumbilical abdominal pain    Rx / DC Orders ED Discharge Orders     None         Godfrey Pick, MD 04/09/22 1458

## 2022-04-10 DIAGNOSIS — E669 Obesity, unspecified: Secondary | ICD-10-CM | POA: Diagnosis present

## 2022-04-10 DIAGNOSIS — Z1152 Encounter for screening for COVID-19: Secondary | ICD-10-CM | POA: Diagnosis not present

## 2022-04-10 DIAGNOSIS — R188 Other ascites: Secondary | ICD-10-CM | POA: Diagnosis present

## 2022-04-10 DIAGNOSIS — E1169 Type 2 diabetes mellitus with other specified complication: Secondary | ICD-10-CM | POA: Diagnosis present

## 2022-04-10 DIAGNOSIS — I714 Abdominal aortic aneurysm, without rupture, unspecified: Secondary | ICD-10-CM | POA: Diagnosis present

## 2022-04-10 DIAGNOSIS — R109 Unspecified abdominal pain: Secondary | ICD-10-CM | POA: Diagnosis not present

## 2022-04-10 DIAGNOSIS — I2489 Other forms of acute ischemic heart disease: Secondary | ICD-10-CM | POA: Diagnosis present

## 2022-04-10 DIAGNOSIS — G589 Mononeuropathy, unspecified: Secondary | ICD-10-CM | POA: Diagnosis not present

## 2022-04-10 DIAGNOSIS — E1151 Type 2 diabetes mellitus with diabetic peripheral angiopathy without gangrene: Secondary | ICD-10-CM | POA: Diagnosis present

## 2022-04-10 DIAGNOSIS — Z992 Dependence on renal dialysis: Secondary | ICD-10-CM

## 2022-04-10 DIAGNOSIS — I700 Atherosclerosis of aorta: Secondary | ICD-10-CM | POA: Diagnosis present

## 2022-04-10 DIAGNOSIS — R410 Disorientation, unspecified: Secondary | ICD-10-CM | POA: Diagnosis present

## 2022-04-10 DIAGNOSIS — I5082 Biventricular heart failure: Secondary | ICD-10-CM | POA: Diagnosis present

## 2022-04-10 DIAGNOSIS — R1033 Periumbilical pain: Secondary | ICD-10-CM | POA: Diagnosis not present

## 2022-04-10 DIAGNOSIS — I132 Hypertensive heart and chronic kidney disease with heart failure and with stage 5 chronic kidney disease, or end stage renal disease: Secondary | ICD-10-CM | POA: Diagnosis present

## 2022-04-10 DIAGNOSIS — E876 Hypokalemia: Secondary | ICD-10-CM | POA: Diagnosis present

## 2022-04-10 DIAGNOSIS — E1140 Type 2 diabetes mellitus with diabetic neuropathy, unspecified: Secondary | ICD-10-CM | POA: Diagnosis present

## 2022-04-10 DIAGNOSIS — R931 Abnormal findings on diagnostic imaging of heart and coronary circulation: Secondary | ICD-10-CM | POA: Diagnosis not present

## 2022-04-10 DIAGNOSIS — G588 Other specified mononeuropathies: Secondary | ICD-10-CM | POA: Diagnosis present

## 2022-04-10 DIAGNOSIS — I731 Thromboangiitis obliterans [Buerger's disease]: Secondary | ICD-10-CM | POA: Diagnosis present

## 2022-04-10 DIAGNOSIS — G2581 Restless legs syndrome: Secondary | ICD-10-CM | POA: Diagnosis present

## 2022-04-10 DIAGNOSIS — N186 End stage renal disease: Secondary | ICD-10-CM

## 2022-04-10 DIAGNOSIS — D631 Anemia in chronic kidney disease: Secondary | ICD-10-CM | POA: Diagnosis present

## 2022-04-10 DIAGNOSIS — J9601 Acute respiratory failure with hypoxia: Secondary | ICD-10-CM | POA: Diagnosis not present

## 2022-04-10 DIAGNOSIS — I5032 Chronic diastolic (congestive) heart failure: Secondary | ICD-10-CM | POA: Diagnosis present

## 2022-04-10 DIAGNOSIS — K760 Fatty (change of) liver, not elsewhere classified: Secondary | ICD-10-CM | POA: Diagnosis present

## 2022-04-10 DIAGNOSIS — E785 Hyperlipidemia, unspecified: Secondary | ICD-10-CM | POA: Diagnosis present

## 2022-04-10 DIAGNOSIS — E1122 Type 2 diabetes mellitus with diabetic chronic kidney disease: Secondary | ICD-10-CM | POA: Diagnosis present

## 2022-04-10 DIAGNOSIS — F329 Major depressive disorder, single episode, unspecified: Secondary | ICD-10-CM | POA: Diagnosis present

## 2022-04-10 DIAGNOSIS — R1084 Generalized abdominal pain: Secondary | ICD-10-CM | POA: Diagnosis not present

## 2022-04-10 LAB — COMPREHENSIVE METABOLIC PANEL
ALT: 20 U/L (ref 0–44)
AST: 22 U/L (ref 15–41)
Albumin: 2.8 g/dL — ABNORMAL LOW (ref 3.5–5.0)
Alkaline Phosphatase: 58 U/L (ref 38–126)
Anion gap: 11 (ref 5–15)
BUN: 43 mg/dL — ABNORMAL HIGH (ref 8–23)
CO2: 25 mmol/L (ref 22–32)
Calcium: 9.9 mg/dL (ref 8.9–10.3)
Chloride: 99 mmol/L (ref 98–111)
Creatinine, Ser: 6.44 mg/dL — ABNORMAL HIGH (ref 0.61–1.24)
GFR, Estimated: 9 mL/min — ABNORMAL LOW (ref >60)
Glucose, Bld: 114 mg/dL — ABNORMAL HIGH (ref 70–99)
Potassium: 3.5 mmol/L (ref 3.5–5.1)
Sodium: 135 mmol/L (ref 135–145)
Total Bilirubin: 0.5 mg/dL (ref 0.3–1.2)
Total Protein: 6.2 g/dL — ABNORMAL LOW (ref 6.5–8.1)

## 2022-04-10 LAB — CBG MONITORING, ED
Glucose-Capillary: 135 mg/dL — ABNORMAL HIGH (ref 70–99)
Glucose-Capillary: 131 mg/dL — ABNORMAL HIGH (ref 70–99)

## 2022-04-10 LAB — BRAIN NATRIURETIC PEPTIDE: B Natriuretic Peptide: 766.3 pg/mL — ABNORMAL HIGH (ref 0.0–100.0)

## 2022-04-10 LAB — CBC WITH DIFFERENTIAL/PLATELET
Abs Immature Granulocytes: 0.06 10*3/uL (ref 0.00–0.07)
Basophils Absolute: 0.1 10*3/uL (ref 0.0–0.1)
Basophils Relative: 1 %
Eosinophils Absolute: 0.1 10*3/uL (ref 0.0–0.5)
Eosinophils Relative: 1 %
HCT: 35.6 % — ABNORMAL LOW (ref 39.0–52.0)
Hemoglobin: 12 g/dL — ABNORMAL LOW (ref 13.0–17.0)
Immature Granulocytes: 1 %
Lymphocytes Relative: 21 %
Lymphs Abs: 1.6 10*3/uL (ref 0.7–4.0)
MCH: 35.9 pg — ABNORMAL HIGH (ref 26.0–34.0)
MCHC: 33.7 g/dL (ref 30.0–36.0)
MCV: 106.6 fL — ABNORMAL HIGH (ref 80.0–100.0)
Monocytes Absolute: 1.4 10*3/uL — ABNORMAL HIGH (ref 0.1–1.0)
Monocytes Relative: 18 %
Neutro Abs: 4.5 10*3/uL (ref 1.7–7.7)
Neutrophils Relative %: 58 %
Platelets: 156 10*3/uL (ref 150–400)
RBC: 3.34 MIL/uL — ABNORMAL LOW (ref 4.22–5.81)
RDW: 14.1 % (ref 11.5–15.5)
WBC: 7.6 10*3/uL (ref 4.0–10.5)
nRBC: 0 % (ref 0.0–0.2)

## 2022-04-10 LAB — BODY FLUID CELL COUNT WITH DIFFERENTIAL
Eos, Fluid: 0 %
Lymphs, Fluid: 24 %
Monocyte-Macrophage-Serous Fluid: 73 % (ref 50–90)
Neutrophil Count, Fluid: 3 % (ref 0–25)
Total Nucleated Cell Count, Fluid: 15 cu mm (ref 0–1000)

## 2022-04-10 LAB — HEMOGLOBIN A1C
Hgb A1c MFr Bld: 5.4 % (ref 4.8–5.6)
Mean Plasma Glucose: 108.28 mg/dL

## 2022-04-10 LAB — GLUCOSE, CAPILLARY: Glucose-Capillary: 117 mg/dL — ABNORMAL HIGH (ref 70–99)

## 2022-04-10 LAB — TSH: TSH: 1.148 u[IU]/mL (ref 0.350–4.500)

## 2022-04-10 MED ORDER — DELFLEX-LC/1.5% DEXTROSE 344 MOSM/L IP SOLN: INTRAPERITONEAL | Status: DC

## 2022-04-10 MED ORDER — CHLORHEXIDINE GLUCONATE CLOTH 2 % EX PADS
6.0000 | MEDICATED_PAD | Freq: Every day | CUTANEOUS | Status: DC
  Administered 2022-04-11 – 2022-04-14 (×5): 6 via TOPICAL

## 2022-04-10 MED ORDER — DELFLEX-LC/2.5% DEXTROSE 394 MOSM/L IP SOLN: INTRAPERITONEAL | Status: DC

## 2022-04-10 MED ORDER — GENTAMICIN SULFATE 0.1 % EX CREA
1.0000 | TOPICAL_CREAM | Freq: Every day | CUTANEOUS | Status: DC
  Administered 2022-04-10 – 2022-04-14 (×6): 1 via TOPICAL
  Filled 2022-04-10: qty 15

## 2022-04-10 NOTE — Hospital Course (Addendum)
Not feeling well.   Stomach continues to hurt. Cramping pain. In L L Q.   PD cath is in RLQ.    L sided TTP, mild rebound.    Pain is improved with sitting still, moving makes it worse.    Trouble breathing.  All the time.    Decreased sensation in bilateral feet.   R 2nd toe s/p amp of most distal aspect.    Not able to wiggle toes.    Uses walker at home to get around.   11/23: Patient is feeling lousy. No nausea or vomiting, Patient is reporting that his abdomen is lousy. Patient is sleeping well on my exam. Patient is on 2L O2 in the room but the [place] is not approprietly on.   11/25: Patient is sleeping on exam and refuses to wake up. Patient is not willing to answer questions. He gets rude with me when I am asking him questions. Patients abdominal pain is much better. Patient states that he is ready to go home. Patient denies any headache, and would like to get some breakfast.

## 2022-04-10 NOTE — Evaluation (Signed)
Physical Therapy Evaluation Patient Details Name: [name] MRN: [id number] DOB: 19-May-1951 Today's Date: 04/10/2022  History of Present Illness  Pt is 71 y.o. male admitted 04/09/22  to the ER at [facility] with abdominal pain concerning for peritonitis, transferred to [facility]. Pt with a history of ESRD on PD, CAD, HF with preserved EF, COPD, PD, and HTN  Clinical Impression  Pt admitted with above diagnosis. At baseline pt can ambulate in home with RW and has 24 hr supervision.  At PT evaluation, pt was very lethargic and requiring increased time and repetition to respond to all questions and commands.   He required mod A for bed mobility and min A to ambulate but only going 5' before having to sit.  All VSS during session but pt limited by pain, fatigue, lethargy, and some confusion. At this time pt is below his baseline and do recommend SNF - if alertness and ability to follow commands, transfer, and ambulate improve could consider recommendation for HHPT. Pt currently with functional limitations due to the deficits listed below (see PT Problem List). Pt will benefit from skilled PT to increase their independence and safety with mobility to allow discharge to the venue listed below.          Recommendations for follow up therapy are one component of a multi-disciplinary discharge planning process, led by the attending physician.  Recommendations may be updated based on patient status, additional functional criteria and insurance authorization.  Follow Up Recommendations Skilled nursing-short term rehab (<3 hours/day) Can patient physically be transported by private vehicle: Yes    Assistance Recommended at Discharge Intermittent Supervision/Assistance  Patient can return home with the following  A lot of help with bathing/dressing/bathroom;A lot of help with walking and/or transfers;Help with stairs or ramp for entrance;Assistance with cooking/housework    Equipment Recommendations None  recommended by PT  Recommendations for Other Services       Functional Status Assessment Patient has had a recent decline in their functional status and demonstrates the ability to make significant improvements in function in a reasonable and predictable amount of time.     Precautions / Restrictions Precautions Precautions: Fall Precaution Comments: Prior back injuries and surgeries.      Mobility  Bed Mobility Overal bed mobility: Needs Assistance Bed Mobility: Sidelying to Sit, Sit to Sidelying   Sidelying to sit: Mod assist     Sit to sidelying: Mod assist      Transfers Overall transfer level: Needs assistance Equipment used: Rolling walker (2 wheels) Transfers: Sit to/from Stand Sit to Stand: Min assist           General transfer comment: Performed x 2; increased time to rise    Ambulation/Gait Ambulation/Gait assistance: Min assist Gait Distance (Feet): 5 Feet (5'x2) Assistive device: Rolling walker (2 wheels) Gait Pattern/deviations: Step-to pattern, Shuffle, Decreased stride length Gait velocity: decreased     General Gait Details: very slow gait; fatigued easily ; unsteady  Stairs            Wheelchair Mobility    Modified Rankin (Stroke Patients Only)       Balance Overall balance assessment: Needs assistance Sitting-balance support: Feet supported Sitting balance-Leahy Scale: Fair     Standing balance support: Bilateral upper extremity supported, Reliant on assistive device for balance Standing balance-Leahy Scale: Poor  Pertinent Vitals/Pain Pain Assessment Pain Assessment: Faces Faces Pain Scale: Hurts whole lot Pain Location: Abdomen Pain Descriptors / Indicators: Guarding, Grimacing, Moaning Pain Intervention(s): Limited activity within patient's tolerance, Monitored during session, Repositioned    Home Living Family/patient expects to be discharged to:: Private residence Living  Arrangements: Spouse/significant other Available Help at Discharge: Family;Available 24 hours/day Type of Home: House Home Access: Stairs to enter Entrance Stairs-Rails: None Entrance Stairs-Number of Steps: 2 Alternate Level Stairs-Number of Steps: Patient does not not need to access the baseline. Home Layout: Two level;Laundry or work area in [place]: Conservation officer, nature (2 wheels);BSC/3in1;Hand held shower head;Grab bars - tub/shower;Shower seat;Wheelchair - manual;Hospital bed Additional Comments: Obtained from OT eval - pt very lethargic during PT eval    Prior Function Prior Level of Function : Needs assist       Physical Assist : ADLs (physical)     Mobility Comments: Uses a W/c for linger distances, RW in the home.  Can drive. Denies falls ADLs Comments: Spouse assists with lower body ADL and iADL.     Hand Dominance   Dominant Hand: Right    Extremity/Trunk Assessment   Upper Extremity Assessment Upper Extremity Assessment: Generalized weakness;Difficult to assess due to impaired cognition    Lower Extremity Assessment Lower Extremity Assessment: Generalized weakness;Difficult to assess due to impaired cognition (Difficulty following commands for MMT and limited by pain)    Cervical / Trunk Assessment Cervical / Trunk Assessment: Kyphotic;Back Surgery  Communication   Communication: No difficulties  Cognition Arousal/Alertness: Lethargic Behavior During Therapy: Flat affect Overall Cognitive Status: Impaired/Different from baseline Area of Impairment: Attention, Following commands, Safety/judgement, Awareness, Problem solving                   Current Attention Level: Sustained   Following Commands: Follows one step commands consistently   Awareness: Intellectual Problem Solving: Slow processing, Decreased initiation, Difficulty sequencing, Requires verbal cues, Requires tactile cues General Comments: Pt lethargic and not consistently  answering questions.  Requiring significant increase in time and repetition to respond to questions or mobility request.  Pt was soundly sleeping at arrival.  Unsure if decreased ability to follow commands related to lethargic, distracted by pain, HOH, or confusion.        General Comments General comments (skin integrity, edema, etc.): VSS. Pt was on 2 L O2 rest with sats 91%.  Once sitting EOB sats 95% 2 L and remained 95% on RA.  When returned to supine placed back on 2 L with sats 91%    Exercises     Assessment/Plan    PT Assessment Patient needs continued PT services  PT Problem List Decreased strength;Decreased cognition;Decreased range of motion;Decreased knowledge of use of DME;Decreased activity tolerance;Decreased safety awareness;Decreased balance;Decreased knowledge of precautions;Decreased mobility       PT Treatment Interventions DME instruction;Balance training;Modalities;Gait training;Functional mobility training;Patient/family education;Therapeutic activities;Therapeutic exercise    PT Goals (Current goals can be found in the Care Plan section)  Acute Rehab PT Goals Patient Stated Goal: decrease pain PT Goal Formulation: With patient Time For Goal Achievement: 04/24/22 Potential to Achieve Goals: Good    Frequency Min 2X/week     Co-evaluation               AM-PAC PT "6 Clicks" Mobility  Outcome Measure Help needed turning from your back to your side while in a flat bed without using bedrails?: A Little Help needed moving from lying on your back to sitting on the  side of a flat bed without using bedrails?: A Lot Help needed moving to and from a bed to a chair (including a wheelchair)?: A Little Help needed standing up from a chair using your arms (e.g., wheelchair or bedside chair)?: A Little Help needed to walk in hospital room?: Total Help needed climbing 3-5 steps with a railing? : Total 6 Click Score: 13    End of Session Equipment Utilized  During Treatment: Gait belt Activity Tolerance: Patient limited by pain;Patient limited by lethargy Patient left: in bed;with call bell/phone within reach (in Ed on stretcher) Nurse Communication: Mobility status PT Visit Diagnosis: Other abnormalities of gait and mobility (R26.89);Muscle weakness (generalized) (M62.81)    Time: 7944-4619 PT Time Calculation (min) (ACUTE ONLY): 26 min   Charges:   PT Evaluation $PT Eval Low Complexity: 1 Low PT Treatments $Therapeutic Activity: 8-22 mins        Abran Richard, PT Acute Rehab [facility] [phone]   Karlton Lemon 04/10/2022, 4:25 PM

## 2022-04-10 NOTE — ED Notes (Signed)
Pt repositioned in bed.

## 2022-04-10 NOTE — Progress Notes (Signed)
HD#0 Subjective:   Summary: This is a 71 y/o male with h/o HTN, DM, CAD, atrial flutter, ESRD (on peritoneal dialysis) HLD, anxiety, OSA, COPD, osteomyelitis, Buerger's disease,and  pacemaker placement presented with generalized weakness, abdominal pain and back pain admitted for suspected peritonitis.   Overnight Events: No acute overnight events  Patient is resting comfortably in bed upon my exam.  Patient does endorse having a sharp abdominal pain diffusely across his abdomen.  Patient has been n.p.o., and has not eaten anything.  He states that this pain started a couple days ago, and does not radiate anywhere.  It stays in his abdomen.  Patient denies any shortness of breath or any chest pain at this time.  Patient denies any other concerns at this time.  Objective:  Vital signs in last 24 hours: Vitals:   04/10/22 0830 04/10/22 0845 04/10/22 1137 04/10/22 1214  BP: (!) 143/74 126/75  126/75  Pulse: 70 67  67  Resp:  18  18  Temp:   (!) 97.5 F (36.4 C)   TempSrc:      SpO2: 96% 98%  98%  Weight:      Height:       Supplemental O2: Nasal Cannula SpO2: 98 % O2 Flow Rate (L/min): 4 L/min patient was put on nasal cannula during my exam  Physical Exam:  Constitutional: Chronically ill-appearing, resting in bed on room air upon my exam HENT: normocephalic atraumatic, mucous membranes moist Eyes: conjunctiva non-erythematous Neck: supple Cardiovascular: regular rate and rhythm, no m/r/g Pulmonary/Chest: Bilateral upper lung field wheezing noted.  Decreased lung sounds with left greater than right. Abdominal: Soft, mildly distended, with tenderness diffusely MSK: normal bulk and tone Neurological: alert & oriented x 3 Extremities: No wounds noted, decreased sensation throughout bilateral feet.  Unable to move toes on command, baseline Skin: warm and dry Psych: Normal mood and affect  Filed Weights   04/09/22 1012  Weight: 122.5 kg    No intake or output data in the  24 hours ending 04/10/22 1430 Net IO Since Admission: 1,000 mL [04/10/22 1430]  Pertinent Labs:    Latest Ref Rng & Units 04/10/2022    3:22 AM 04/09/2022   10:36 AM 10/11/2021    1:44 AM  CBC  WBC 4.0 - 10.5 K/uL 7.6  10.5  10.5   Hemoglobin 13.0 - 17.0 g/dL 12.0  12.2  12.1   Hematocrit 39.0 - 52.0 % 35.6  35.6  35.0   Platelets 150 - 400 K/uL 156  175  163        Latest Ref Rng & Units 04/10/2022    3:22 AM 04/09/2022   10:36 AM 10/11/2021    1:44 AM  CMP  Glucose 70 - 99 mg/dL 114  124  245   BUN 8 - 23 mg/dL 43  44  62   Creatinine 0.61 - 1.24 mg/dL 6.44  6.03  5.36   Sodium 135 - 145 mmol/L 135  133  135   Potassium 3.5 - 5.1 mmol/L 3.5  3.2  3.5   Chloride 98 - 111 mmol/L 99  96  98   CO2 22 - 32 mmol/L '25  28  26   '$ Calcium 8.9 - 10.3 mg/dL 9.9  9.9  8.8   Total Protein 6.5 - 8.1 g/dL 6.2  6.8  5.7   Total Bilirubin 0.3 - 1.2 mg/dL 0.5  0.6  0.5   Alkaline Phos 38 - 126 U/L 58  75  55   AST 15 - 41 U/L '22  19  21   '$ ALT 0 - 44 U/L '20  20  20     '$ Imaging: CT Head Wo Contrast  Result Date: 04/09/2022 CLINICAL DATA:  Provided history: Mental status change, unknown cause. EXAM: CT HEAD WITHOUT CONTRAST TECHNIQUE: Contiguous axial images were obtained from the base of the skull through the vertex without intravenous contrast. RADIATION DOSE REDUCTION: This exam was performed according to the departmental dose-optimization program which includes automated exposure control, adjustment of the mA and/or kV according to patient size and/or use of iterative reconstruction technique. COMPARISON:  Head CT 11/17/2019. FINDINGS: Brain: Mild-to-moderate cerebral atrophy. Advanced patchy and ill-defined hypoattenuation within the cerebral white matter, nonspecific but compatible with chronic small vessel ischemic disease. Tiny chronic lacunar infarct within the left thalamocapsular junction, new from the prior head CT. There is no acute intracranial hemorrhage. No demarcated cortical  infarct. No extra-axial fluid collection. No evidence of an intracranial mass. No midline shift. Vascular: No hyperdense vessel. Atherosclerotic calcifications. Skull: No fracture or aggressive osseous lesion. Sinuses/Orbits: No mass or acute finding within the imaged orbits. Small mucous retention cyst, and background mild mucosal thickening, within the right maxillary sinus at the imaged levels. 14 mm mucous retention cyst within the left maxillary sinus at the imaged levels. Minimal mucosal thickening and fluid scattered within the bilateral ethmoid air cells. Trace mucosal thickening within the left frontoethmoidal recess. IMPRESSION: 1. No evidence of acute intracranial abnormality. 2. Parenchymal atrophy and chronic small vessel ischemic disease, as described. 3. Paranasal sinus disease at the imaged levels, as described. Electronically Signed   By: Kellie Simmering D.O.   On: 04/09/2022 15:05    Assessment/Plan:   Principal Problem:   Abdominal pain   Patient Summary: [name] is a 71 y.o. male with h/o HTN, DM, CAD, atrial flutter, ESRD (on peritoneal dialysis) HLD, anxiety, OSA, COPD, osteomyelitis, Buerger's disease,and  pacemaker placement presented with generalized weakness, abdominal pain and back pain admitted for suspected peritonitis.   #Undifferentiated abdominal pain Patient presented with 2-day history of abdominal pain.  Patient has a past medical history of ESRD on peritoneal dialysis.  Patient states that this pain started about 2 days ago.  Initial workup in the ED showed white count of 10.5, creatinine elevated at 6.03, otherwise nothing remarkable.  Lipase was negative, lactic acid was negative.  Patient did not have any concerns of postprandial pain versus pain related to eating.  Pancreatitis ruled out, given timing, chronic mesenteric ischemia less likely.  CT did not show anything specific.  Unclear etiology at this moment.  There was suspected peritonitis given patient  has a history of peritoneal dialysis for the past year, but fluid analysis did not show any white cells.  Pain likely due to patient not able to tolerate peritoneal dialysis, and likely may need to transition to hemodialysis. -Appreciate nephrology recommendations -Will continue to follow fluid cultures -Supportive care for now -Restart patient diet  #ESRD on peritoneal dialysis Patient has a history of ESRD and started peritoneal dialysis 1 year ago.  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  Patient's creatinine this morning is 6.  Patient to get peritoneal dialysis during admission.  Will consult with nephrology to see if he is a proper candidate for hemodialysis -Continue with peritoneal dialysis -Appreciate nephrology input -If this abdominal pain is coming from potential intolerance of peritoneal dialysis, consider moving to hemodialysis  #  Acute hypoxic respiratory failure Initially was complained of shortness of breath, and this could be due to volume overload given patient was unable to do his peritoneal dialysis given his abdominal pain.  Patient states that his shortness of breath is better this morning, but oxygen saturations remained in the high 80s on room air.  Unclear if this was due to improper positioning of pulse ox versus acute respiratory failure.  Patient did require nasal cannula, and currently on 4 L nasal cannula satting at 96%.  Will continue to try to wean off.  Unclear etiology at this time.  There is some upper respiratory wheezing noted. -Continue to wean oxygen as tolerated with SpO2 goal greater than 90% -Incentive spirometry -Continue to monitor respiratory status  #CAD #Hyperlipidemia Patient has a past medical history of coronary artery disease as well as hyperlipidemia. -Continue 81 mg aspirin daily -Continue Plavix 75 mg daily -Continue atorvastatin 75 mg daily   #Type 2 diabetes mellitus with diabetic  neuropathy Patient's A1c resulted at 5.4.  Patient's A1c at goal.  Patient does not seem to be on any medications per chart review at home for diabetic control.  This is likely diet controlled.  CBGs ranging between 122-131. -Continue sliding scale insulin  #Anxiety/depression Patient is on Zoloft 25 mg daily -Continue Zoloft 25 mg daily -Continue risperidone 0.5 mg daily at bedtime  #Restless leg syndrome -Continue ropinirole 1 mg daily  #Chronic back pain -PT/OT -Lidocaine patch offered  Diet: Renal IVF: None,None VTE: Heparin Code: Full PT/OT recs: HH OT, awaiting PT recs   Dispo: Anticipated discharge to Home in 2 days pending clinical improvement.   [facility] Resident PGY-1 [phone] Please contact the on call pager after 5 pm and on weekends at [phone].

## 2022-04-10 NOTE — Evaluation (Signed)
Occupational Therapy Evaluation Patient Details Name: [name] MRN: [id number] DOB: 09-Apr-1951 Today's Date: 04/10/2022   History of Present Illness 71 y.o. male with a history of ESRD on PD, CAD, HF with preserved EF, COPD, PD, and HTN who first presented to the ER at [facility] with abdominal pain concerning for peritonitis, transferred to [facility].  Patient stating he's hadcouple of days of worsening abdominal pain which limited mobility.  He had a low grade fever starting Friday/Saturday of this past week.   Clinical Impression   Patient admitted for the diagnosis above.  PTA he lives at home with his spouse, who assists with ADL and iADL.  Patient typically walks with a RW in the home, and uses a wheelchair for longer distances.  Deficit is continued abdominal pain impacting mobility, his SOB is baseline, per the patient.  Currently he is needing generalized Mod A for bed mobility and ADL completion from a seated position.   OT will follow in the acute setting, and HH OT can be considered depending on progress.       Recommendations for follow up therapy are one component of a multi-disciplinary discharge planning process, led by the attending physician.  Recommendations may be updated based on patient status, additional functional criteria and insurance authorization.   Follow Up Recommendations  Home health OT     Assistance Recommended at Discharge Intermittent Supervision/Assistance  Patient can return home with the following Help with stairs or ramp for entrance;Assist for transportation;Assistance with cooking/housework;A lot of help with walking and/or transfers;A lot of help with bathing/dressing/bathroom    Functional Status Assessment  Patient has had a recent decline in their functional status and demonstrates the ability to make significant improvements in function in a reasonable and predictable amount of time.  Equipment Recommendations  None recommended by OT     Recommendations for Other Services       Precautions / Restrictions Precautions Precautions: Fall Precaution Comments: Prior back injuries and surgeries. Restrictions Weight Bearing Restrictions: No      Mobility Bed Mobility Overal bed mobility: Needs Assistance Bed Mobility: Sidelying to Sit, Sit to Sidelying   Sidelying to sit: Mod assist     Sit to sidelying: Mod assist      Transfers Overall transfer level: Needs assistance Equipment used: Rolling walker (2 wheels) Transfers: Sit to/from Stand, Bed to chair/wheelchair/BSC Sit to Stand: Min assist Stand pivot transfers: Mod assist                Balance Overall balance assessment: Needs assistance Sitting-balance support: Feet supported Sitting balance-Leahy Scale: Fair     Standing balance support: Bilateral upper extremity supported Standing balance-Leahy Scale: Poor                             ADL either performed or assessed with clinical judgement   ADL       Grooming: Wash/dry hands;Wash/dry face;Set up;Sitting   Upper Body Bathing: Moderate assistance;Sitting       Upper Body Dressing : Moderate assistance;Sitting       Toilet Transfer: Moderate assistance;BSC/3in1;Stand-pivot;Rolling walker (2 wheels)                   Vision Baseline Vision/History: 1 Wears glasses Patient Visual Report: No change from baseline       Perception     Praxis      Pertinent Vitals/Pain Pain Assessment Pain Assessment: Faces Faces Pain  Scale: Hurts whole lot Pain Location: Abdomen Pain Descriptors / Indicators: Guarding, Grimacing, Sharp Pain Intervention(s): Monitored during session     Hand Dominance Right   Extremity/Trunk Assessment Upper Extremity Assessment Upper Extremity Assessment: Generalized weakness   Lower Extremity Assessment Lower Extremity Assessment: Defer to PT evaluation   Cervical / Trunk Assessment Cervical / Trunk Assessment: Kyphotic;Back  Surgery   Communication Communication Communication: No difficulties   Cognition Arousal/Alertness: Awake/alert Behavior During Therapy: WFL for tasks assessed/performed Overall Cognitive Status: Within Functional Limits for tasks assessed                                       General Comments   VSS on supplemental O2    Exercises     Shoulder Instructions      Home Living Family/patient expects to be discharged to:: Private residence Living Arrangements: Spouse/significant other Available Help at Discharge: Family;Available 24 hours/day Type of Home: House Home Access: Stairs to enter [employer] of Steps: 2 Entrance Stairs-Rails: None Home Layout: Two level;Laundry or work area in basement Alternate [employer] of Steps: Patient dies not not need to access the baseline.   Bathroom Shower/Tub: Occupational psychologist: Handicapped height Bathroom Accessibility: Yes How Accessible: Accessible via walker Home Equipment: Conservation officer, nature (2 wheels);BSC/3in1;Hand held shower head;Grab bars - tub/shower;Shower seat;Wheelchair - manual;Hospital bed          Prior Functioning/Environment Prior Level of Function : Needs assist       Physical Assist : ADLs (physical)     Mobility Comments: Uses a W/c for linger distances, RW in the home.  Can drive. ADLs Comments: Spouse assists with lower body ADL and iADL.        OT Problem List: Decreased strength;Decreased activity tolerance;Impaired balance (sitting and/or standing);Pain      OT Treatment/Interventions: Self-care/ADL training;Therapeutic activities;DME and/or AE instruction;Patient/family education;Balance training    OT Goals(Current goals can be found in the care plan section) Acute Rehab OT Goals Patient Stated Goal: Feel better OT Goal Formulation: With patient Time For Goal Achievement: 04/24/22 Potential to Achieve Goals: Good ADL Goals Pt Will Perform  Grooming: with modified independence;standing Pt Will Perform Upper Body Bathing: with modified independence;sitting Pt Will Perform Upper Body Dressing: with modified independence;sitting Pt Will Transfer to Toilet: with modified independence;regular height toilet;ambulating  OT Frequency: Min 2X/week    Co-evaluation              AM-PAC OT "6 Clicks" Daily Activity     Outcome Measure Help from another person eating meals?: None Help from another person taking care of personal grooming?: A Little Help from another person toileting, which includes using toliet, bedpan, or urinal?: A Lot Help from another person bathing (including washing, rinsing, drying)?: A Lot Help from another person to put on and taking off regular upper body clothing?: A Lot Help from another person to put on and taking off regular lower body clothing?: Total 6 Click Score: 14   End of Session Equipment Utilized During Treatment: Gait belt;Rolling walker (2 wheels);Oxygen Nurse Communication: Mobility status  Activity Tolerance: Patient limited by pain Patient left: in bed;with call bell/phone within reach  OT Visit Diagnosis: Unsteadiness on feet (R26.81);Muscle weakness (generalized) (M62.81)                Time: 0947-0962 OT Time Calculation (min): 20 min Charges:  OT General Charges $  OT Visit: 1 Visit OT Evaluation $OT Eval Moderate Complexity: 1 Mod  04/10/2022  RP, OTR/L  Acute Rehabilitation Services  Office:  [phone]   Metta Clines 04/10/2022, 12:30 PM

## 2022-04-10 NOTE — Consult Note (Addendum)
[facility] Renal Consultation Note  Requesting MD: Bobby Rumpf, MD Indication for Consultation:  ESRD  Chief complaint: abdominal pain  HPI:  [name] is a 71 y.o. male with a history of ESRD on PD, CAD, HF with preserved EF, COPD, PD, and HTN who presented to the ER at [facility] with abdominal pain concerning for peritonitis.  He had a couple of days of worsening abdominal pain which "hurt like crazy - I couldn't walk".  He had a low grade fever starting Friday/Saturday of this past week.  His abdominal pain was 8/10 at its worse and a 4/10 now.  He has had some shortness of breath but not an overwhelming issue.  He states that his PD fluid has been clear.  He states that he has never had peritonitis before.  His wife (who is a Marine scientist per prior charting) does his PD.  He can't think of a breach in sterile protocol.  They do not have pets.  He was sent to [facility] as [facility] is not able to do PD.  PD fluid cultures were obtained overnight and are still in process.  He had a CT scan without acute process and stable/chronic findings.  Nephrology is consulted for assistance with management of PD.  Note that the patient lives in [place] and follows with the [place] group and does dialysis through [place].      PMHx:   Past Medical History:  Diagnosis Date   AAA (abdominal aortic aneurysm) ([place]) 06/2016   Mild increase in size of 3.4 cm infrarenal abdominal aortic aneurysm   Anemia    Aortic atherosclerosis (HCC)    Arthritis    Asthma    Atrial flutter (HCC)    AVF (arteriovenous fistula) (HCC)    Left forearm   CAD (coronary artery disease)    a. s/p prior stenting of RCA in 1999 b. cath in 2003 showing moderate CAD c. NST in 2016 showing small area of ischemic and low-risk   CHF (congestive heart failure) ([place])    CKD (chronic kidney disease), stage V ([place])    kidney transplant evaluation   COPD (chronic obstructive pulmonary disease) ([place])    with ongoing  tobacco use and patient failed sham takes   Diverticulosis 2018   Mild sigmoid colon diverticulosis.   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    Reflux esophagitis    hx   Two-vessel coronary artery disease    moderate. by cath in 2003. Status post stenting of the mdi RCA November 1999 normal left ventricular ejection fraction   Wears glasses    Wears hearing aid    B/L    Past Surgical History:  Procedure Laterality Date   A/V FISTULAGRAM Left 04/30/2019   Procedure: A/V FISTULAGRAM;  Surgeon: Elam Dutch, MD;  Location: [place] [facility];  Service: Cardiovascular;  Laterality: Left;   A/V FISTULAGRAM Left 09/22/2019   Procedure: A/V FISTULAGRAM;  Surgeon: Marty Heck, MD;  Location: [place] [facility];  Service: Cardiovascular;  Laterality: Left;   ABDOMINAL SURGERY     APPENDECTOMY     AV FISTULA PLACEMENT Right 03/11/2018   Procedure: CREATION Brachiocephalic Fistula RIGHT ARM;  Surgeon: Rosetta Posner, MD;  Location: [place];  Service: Vascular;  Laterality: Right;   AV FISTULA PLACEMENT Left 05/06/2019   Procedure: LEFT BRACHIOCEPHALIC ARTERIOVENOUS (AV) FISTULA CREATION;  Surgeon: Marty Heck, MD;  Location: [place];  Service: Vascular;  Laterality: Left;   BACK SURGERY     x3; cages in patient's back since 2000   BIOPSY  11/12/2018   Procedure: BIOPSY;  Surgeon: Laurence Spates, MD;  [facility]: [facility];  Service: Endoscopy;;   CARDIAC  CATHETERIZATION     stent   CHOLECYSTECTOMY     CLOSED REDUCTION SHOULDER DISLOCATION     COLONOSCOPY W/ BIOPSIES AND POLYPECTOMY     COLONOSCOPY WITH PROPOFOL N/A 11/12/2018   Procedure: COLONOSCOPY WITH PROPOFOL;  Surgeon: Laurence Spates, MD;  [facility];  Service: Endoscopy;  Laterality: N/A;   COLONOSCOPY WITH PROPOFOL N/A 10/02/2021   Procedure: COLONOSCOPY WITH PROPOFOL;  Surgeon: Clarene Essex, MD;  [facility];  Service: Gastroenterology;  Laterality: N/A;   CORONARY ANGIOPLASTY     CYSTOSCOPY WITH INSERTION OF UROLIFT N/A 11/06/2020   Procedure: CYSTOSCOPY WITH INSERTION OF UROLIFT;  Surgeon: Cleon Gustin, MD;  [facility]: [facility];  Service: Urology;  Laterality: N/A;   diabetic ulcers     DIAGNOSTIC LAPAROSCOPY     DIALYSIS/PERMA CATHETER REMOVAL N/A 09/22/2019   Procedure: DIALYSIS/PERMA CATHETER REMOVAL;  Surgeon: Marty Heck, MD;  Location: [place] [facility];  Service: Cardiovascular;  Laterality: N/A;   ESOPHAGOGASTRODUODENOSCOPY (EGD) WITH PROPOFOL N/A 11/12/2018   Procedure: ESOPHAGOGASTRODUODENOSCOPY (EGD) WITH PROPOFOL;  Surgeon: Laurence Spates, MD;  [facility];  Service: Endoscopy;  Laterality: N/A;   GROIN EXPLORATION     HEMOSTASIS CLIP PLACEMENT  11/12/2018   Procedure: HEMOSTASIS CLIP PLACEMENT;  Surgeon: Laurence Spates, MD;  [facility];  Service: Endoscopy;;   HIP ARTHROPLASTY Left    gamma nail   INSERT / REPLACE / [place] (AV) ARTEGRAFT ARM Left 03/18/2018   Procedure: INSERTION OF ARTERIOVENOUS (AV) FISTULA;  Surgeon: Rosetta Posner, MD;  Location: [place];  Service: Vascular;  Laterality: Left;   IR FLUORO GUIDE CV LINE RIGHT  04/11/2019   IR US GUIDE VASC ACCESS RIGHT  04/11/2019   LEFT HEART CATH AND CORONARY ANGIOGRAPHY N/A 07/23/2019   Procedure: LEFT HEART CATH AND CORONARY ANGIOGRAPHY;  Surgeon: Martinique, Peter M, MD;  Location: [place] [facility];  Service:  Cardiovascular;  Laterality: N/A;   LEFT HEART CATH AND CORONARY ANGIOGRAPHY N/A 10/10/2021   Procedure: LEFT HEART CATH AND CORONARY ANGIOGRAPHY;  Surgeon: [name], MD;  Location: [place] [facility];  Service: Cardiovascular;  Laterality: N/A;   LIGATION ARTERIOVENOUS GORTEX GRAFT Right 03/18/2018   Procedure: LIGATION ARTERIOVENOUS FISTULA;  Surgeon: Rosetta Posner, MD;  [facility];  Service: Vascular;  Laterality: Right;   OSTECTOMY Left 04/23/2017   Procedure: OSTECTOMY LEFT GREAT TOE;  Surgeon: [name], DPM;  Location: [facility];  Service: Podiatry;  Laterality: Left;  left great toe   POLYPECTOMY  11/12/2018   Procedure: POLYPECTOMY;  Surgeon: Laurence Spates, MD;  [facility];  Service: Endoscopy;;   POLYPECTOMY  10/02/2021   Procedure: POLYPECTOMY;  Surgeon: Clarene Essex, MD;  Location[facility];  Service: Gastroenterology;;   SPINAL CORD STIMULATOR INSERTION N/A 12/20/2020   Procedure: Permanent Spinal Cord Stimulator  Lead Implant with Fluoroscopy and Battery Implant;  Surgeon: Reece Agar, MD;  Location: [place];  Service: Neurosurgery;  Laterality: N/A;   TUMOR REMOVAL     small intestine x3   VISCERAL ANGIOGRAPHY N/A 08/02/2019   Procedure: VISCERAL ANGIOGRAPHY;  Surgeon: Waynetta Sandy, MD;  Location: [place] [facility];  Service: Cardiovascular;  Laterality: N/A;    Family Hx:  Family History  Problem Relation Age of Onset   Diabetes Mother    Alcohol abuse Father     Social History:  reports that he quit smoking about 2 years ago. His smoking use included cigarettes. He started smoking about 53 years ago. He has never used smokeless tobacco. He reports that he does not currently use alcohol. He reports that he does not use drugs.  Allergies:  Allergies  Allergen Reactions   Hydrocodone Itching   Other     Opiods cause hallucinations and delusions has to be given risperidone to settle down Other reaction(s):  hallucinations/delirium   Oxycodone-Acetaminophen Other (See Comments)    Medications: Prior to Admission medications   Medication Sig Start Date End Date Taking? Authorizing Provider  albuterol (VENTOLIN HFA) 108 (90 Base) MCG/ACT inhaler Inhale 2 puffs into the lungs every 6 (six) hours as needed for wheezing or shortness of breath.     [provider]  amLODipine (NORVASC) 10 MG tablet Take 1 tablet (10 mg total) by mouth daily. 10/22/21   Arnoldo Lenis, MD  aspirin EC 81 MG EC tablet Take 1 tablet (81 mg total) by mouth daily. 07/25/19   Isaac Bliss, Rayford Halsted, MD  atorvastatin (LIPITOR) 40 MG tablet Take 1 tablet by mouth once daily 03/11/22   Arnoldo Lenis, MD  beclomethasone (QVAR) 80 MCG/ACT inhaler Inhale 2 puffs into the lungs in the morning and at bedtime.    [provider]  buPROPion (WELLBUTRIN XL) 150 MG 24 hr tablet Take 150 mg by mouth daily. 01/01/22   [provider]  CALCIUM CITRATE PO Take 600 mg by mouth in the morning and at bedtime.    [provider]  carvedilol (COREG) 25 MG tablet Take 1 tablet (25 mg total) by mouth 2 (two) times daily with a meal. 09/13/21   Branch, Alphonse Guild, MD  Cholecalciferol (VITAMIN D3) 2000 units TABS Take 2,000 Units by mouth daily.     [provider]  clobetasol (TEMOVATE) 0.05 % external solution Apply 1 application topically 2 (two) times daily as needed (itching).    [provider]  denosumab (PROLIA) 60 MG/ML SOSY injection Inject 60 mg into the skin every 6 (six) months.    [provider]  Ergocalciferol (CALCIFEROL PO) Take 0.25 mg by mouth daily.    [provider]  linaclotide (LINZESS) 290 MCG CAPS capsule Take 290 mcg by mouth daily before breakfast.    [provider]  magnesium oxide (MAG-OX) 400 MG tablet Take 400 mg by mouth daily.    [provider]  Melatonin 10 MG TABS Take 20 mg by mouth at bedtime.    [provider]  multivitamin (RENA-VIT) TABS tablet Take 1 tablet by mouth daily.    [provider]  nitroGLYCERIN (NITROSTAT) 0.4 MG SL tablet Place 1 tablet (  0.4 mg total) under the tongue every 5 (five) minutes as needed for chest pain. 10/22/21   Arnoldo Lenis, MD  pantoprazole (PROTONIX) 40 MG tablet Take 1 tablet (40 mg total) by mouth daily. 10/11/21 10/11/22  Darliss Cheney, MD  PLAVIX 75 MG tablet Take 75 mg by mouth daily.    [provider]  Potassium Chloride ER 20 MEQ TBCR Take 20 mEq by mouth daily. 09/28/20   [provider]  potassium chloride SA (KLOR-CON M) 20 MEQ tablet Take 20 mEq by mouth daily. 01/23/22   [provider]  risperiDONE (RISPERDAL) 0.5 MG tablet Take 0.5 mg by mouth at bedtime.    [provider]  rOPINIRole (REQUIP) 1 MG tablet Take 1 mg by mouth daily. 03/02/21   [provider]  senna-docusate (SENOKOT-S) 8.6-50 MG tablet Take 2 tablets by mouth at bedtime as needed for mild constipation.    [provider]  sertraline (ZOLOFT) 25 MG tablet Take 25 mg by mouth in the morning. 09/22/20   [provider]  traMADol (ULTRAM) 50 MG tablet Take 50 mg by mouth every 4 (four) hours as needed for moderate pain. 09/13/21   [provider]    I have reviewed the patient's current and reported prior to admission medications.  Labs:     Latest Ref Rng & Units 04/10/2022    3:22 AM 04/09/2022   10:36 AM 10/11/2021    1:44 AM  BMP  Glucose 70 - 99 mg/dL 114  124  245   BUN 8 - 23 mg/dL 43  44  62   Creatinine 0.61 - 1.24 mg/dL 6.44  6.03  5.36   Sodium 135 - 145 mmol/L 135  133  135   Potassium 3.5 - 5.1 mmol/L 3.5  3.2  3.5   Chloride 98 - 111 mmol/L 99  96  98   CO2 22 - 32 mmol/L '25  28  26   '$ Calcium 8.9 - 10.3 mg/dL 9.9  9.9  8.8     ROS:  Pertinent items noted in HPI and remainder of comprehensive ROS otherwise negative.  Physical Exam: Vitals:   04/10/22 0445 04/10/22 0500  BP:  127/88   Pulse:  68  Resp: (!) 22 (!) 21  Temp:    SpO2:  90%     General: adult male in stretcher in NAD at rest  HEENT: NCAT Eyes: EOMI sclera anicteric Neck: supple trachea midline Heart: S1S2 no rub Lungs: clear and unlabored on room air Abdomen: soft; mild tenderness however mostly non-tender on palpation for me; PD catheter in place with gauze dressing Extremities: no edema appreciated; no cyanosis or clubbing Skin: no rash on extremities exposed Neuro: tired but answers orienting questions and provides hx; alert and oriented x3; follows commands  Assessment/Plan:  # Abdominal pain concerning for peritonitis related to PD - Nephrology will need to follow-up PD fluid cell count and cultures - these were obtained overnight and still in process - Antibiotics per the cell count results - anticipate vanc and cefepime   # ESRD - Continue PD tonight here.  He appears to have a regimen of 1.5% and 2.5% dextrose - will try this tonight.   - Follows with [facility] and [facility] - will need to obtain his PD rx  # HTN - controlled on current regimen   # Heart failure with preserved EF  - compensated  - optimize volume with PD as above   # Anemia  of CKD  - Mild - no indication for ESA at this time   # Hypokalemia - note on daily supplement at home - would at first trend his labs to determine if needs have changed with acute illness (no PD overnight 40/97 pm)  # Metabolic bone disease - Note this patient is on prolia -would discontinue given ESRD and risk for severe hypocalcemia  Note that this patient is listed as obs and is appropriate for inpatient status should he have peritonitis  Please contact us with any questions   Claudia Desanctis 04/10/2022, 5:21 AM    Addendum:  At this time, PD fluid cell counts are not suggestive of peritonitis and he has thankfully improved without antibiotics.  Culture without organisms  Defer antibiotics and continue supportive care  Claudia Desanctis, MD 5:59 AM 04/10/2022

## 2022-04-11 ENCOUNTER — Inpatient Hospital Stay (HOSPITAL_COMMUNITY): Payer: Medicare Other

## 2022-04-11 DIAGNOSIS — G589 Mononeuropathy, unspecified: Secondary | ICD-10-CM | POA: Diagnosis not present

## 2022-04-11 DIAGNOSIS — R1084 Generalized abdominal pain: Secondary | ICD-10-CM

## 2022-04-11 LAB — BASIC METABOLIC PANEL
Anion gap: 13 (ref 5–15)
BUN: 51 mg/dL — ABNORMAL HIGH (ref 8–23)
CO2: 26 mmol/L (ref 22–32)
Calcium: 9.5 mg/dL (ref 8.9–10.3)
Chloride: 96 mmol/L — ABNORMAL LOW (ref 98–111)
Creatinine, Ser: 7.37 mg/dL — ABNORMAL HIGH (ref 0.61–1.24)
GFR, Estimated: 7 mL/min — ABNORMAL LOW (ref >60)
Glucose, Bld: 121 mg/dL — ABNORMAL HIGH (ref 70–99)
Potassium: 3.5 mmol/L (ref 3.5–5.1)
Sodium: 135 mmol/L (ref 135–145)

## 2022-04-11 LAB — GLUCOSE, CAPILLARY
Glucose-Capillary: 126 mg/dL — ABNORMAL HIGH (ref 70–99)
Glucose-Capillary: 114 mg/dL — ABNORMAL HIGH (ref 70–99)
Glucose-Capillary: 140 mg/dL — ABNORMAL HIGH (ref 70–99)
Glucose-Capillary: 181 mg/dL — ABNORMAL HIGH (ref 70–99)

## 2022-04-11 LAB — PATHOLOGIST SMEAR REVIEW

## 2022-04-11 MED ORDER — IPRATROPIUM-ALBUTEROL 0.5-2.5 (3) MG/3ML IN SOLN
3.0000 mL | Freq: Four times a day (QID) | RESPIRATORY_TRACT | Status: DC | PRN
  Administered 2022-04-11 – 2022-04-12 (×2): 3 mL via RESPIRATORY_TRACT
  Filled 2022-04-11 (×2): qty 3

## 2022-04-11 MED ORDER — ALUM & MAG HYDROXIDE-SIMETH 200-200-20 MG/5ML PO SUSP
30.0000 mL | Freq: Once | ORAL | Status: AC
  Administered 2022-04-11: 30 mL via ORAL
  Filled 2022-04-11: qty 30

## 2022-04-11 MED ORDER — LIDOCAINE VISCOUS HCL 2 % MT SOLN
15.0000 mL | Freq: Once | OROMUCOSAL | Status: AC
  Administered 2022-04-11: 15 mL via ORAL
  Filled 2022-04-11: qty 15

## 2022-04-11 MED ORDER — CARVEDILOL 25 MG PO TABS
25.0000 mg | ORAL_TABLET | Freq: Two times a day (BID) | ORAL | Status: DC
  Administered 2022-04-11 – 2022-04-14 (×5): 25 mg via ORAL
  Filled 2022-04-11 (×7): qty 1

## 2022-04-11 MED ORDER — POLYETHYLENE GLYCOL 3350 17 G PO PACK
17.0000 g | PACK | Freq: Every day | ORAL | Status: DC
  Administered 2022-04-11: 17 g via ORAL
  Filled 2022-04-11: qty 1

## 2022-04-11 MED ORDER — AMLODIPINE BESYLATE 10 MG PO TABS
10.0000 mg | ORAL_TABLET | Freq: Every day | ORAL | Status: DC
  Administered 2022-04-11 – 2022-04-14 (×4): 10 mg via ORAL
  Filled 2022-04-11 (×4): qty 1

## 2022-04-11 MED ORDER — SENNOSIDES-DOCUSATE SODIUM 8.6-50 MG PO TABS
2.0000 | ORAL_TABLET | Freq: Two times a day (BID) | ORAL | Status: DC
  Administered 2022-04-11 (×2): 2 via ORAL
  Filled 2022-04-11 (×2): qty 2

## 2022-04-11 NOTE — Progress Notes (Addendum)
Subjective:   No overnight events.  Patient continues to have abdominal pain and has not had a bowel movement yet since his admission.  He states that he is hungry and ready to eat breakfast.  He is excited about his wife bringing him [facility] today.  Patient is unsure of if he uses NSAIDs at home given that his wife controls his medications.  Will speak with wife today regarding his NSAID use.  Patient states that his breathing has improved.  He denies chest pain, shortness of breath, nausea, vomiting, diarrhea. Discussed plan for GI cocktail.   Objective:  Vital signs in last 24 hours: Vitals:   04/10/22 1537 04/10/22 1729 04/10/22 2104 04/11/22 0614  BP:  129/70 133/78 (!) 157/73  Pulse:  61 62 70  Resp:  '20 19 19  '$ Temp: 98.5 F (36.9 C) 98 F (36.7 C) 97.6 F (36.4 C) 98.3 F (36.8 C)  TempSrc: Oral Oral Oral Oral  SpO2:  96% 93% 94%  Weight:      Height:       Physical Exam: Constitutional: Chronically ill-appearing, resting in bed, on room air HENT: mucous membranes moist Cardiovascular: regular rate and rhythm, no murmurs, rubs, or gallops Pulmonary: mild wheezing in all lung fields  Abdominal: Soft, mildly distended, epigastric tenderness Neurological: alert & oriented x 3 Skin: warm and dry Psych: Normal mood and affect  Assessment/Plan:  Principal Problem:   Abdominal pain Active Problems:   ESRD on peritoneal dialysis ([place])  [name] is a 71 y.o. male with h/o HTN, DM, CAD, atrial flutter, ESRD (on peritoneal dialysis) HLD, anxiety, OSA, COPD, osteomyelitis, Buerger's disease,and  pacemaker placement presented with generalized weakness, abdominal pain and back pain admitted for suspected peritonitis.    #Undifferentiated abdominal pain Presented w/ 2 days of abdominal pain. Initially presented w/ leukocytosis and elevated creatinine. Lipase and lactic acid WNL. Low suspicion for pancreatitis or chronic mesenteric ischemia at this time. CT  non-specific. Peritoneal fluid analysis negative for elevated WBCs or ogranisms. Pending culture. Low suspicion for peritonitis at this time. The cause of his abdominal pain is currently unclear but may be related to peritoneal dialysis. Patient may need to transition to HD. Patient continues to have abdominal pain today but is ready to eat.  He has mild epigastric tenderness on exam. Will discuss with wife regarding NSAID use at home.  -F/u nephrology recommendations -Pending body fluid culture and blood cultures -Continue supportive care (tylenol, bisacodyl, senna BID, protonix 40 mg) -Start miralax  -Renal diet -Ordered maalox and viscous lidocaine -KUB ordered   #ESRD on peritoneal dialysis Patient began PD 1 year ago. 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 Patient did not received PD last night. Will continue PD today per nephrology. Will f/u on nephrology recs to see if patient is a candidate for HD.  -Continue PD  #Acute hypoxic respiratory failure Presented w/ SOB. Suspected to be secondary to volume overload from inability to undergo PD due to abdominal pain. Patient initially required 4L [place]. Currently > 95% on RA.  Patient states that his breathing has improved today. On exam, patient has mild wheezing in all lung fields.  -Continue PRN Duonebs -Continue incentive spirometry -Continue to monitor respiratory status   #CAD #Hyperlipidemia Chronic conditions.  -Continue 81 mg aspirin daily -Continue Plavix 75 mg daily -Continue atorvastatin 75 mg daily   #HTN Patient hypertensive this morning.  Will restart home medications. -Start home carvedilol 25 mg BID -Start home amlodipine 10 mg daily   #  Type 2 diabetes mellitus with diabetic neuropathy A1c at goal at 5.4. Patient is diet controlled at home. CBGs remain in low 100s.  -Continue sliding scale insulin   #Anxiety/depression Patient takes Zoloft 25 mg daily and risperidone 0.5 mg daily at home.  -Continue Zoloft 25 mg  daily -Continue risperidone 0.5 mg daily at bedtime -Melatonin 5 mg daily at bedtime   #Restless leg syndrome -Continue ropinirole 1 mg daily   #Chronic back pain -PT/OT -Lidocaine patch ordered  #Dispo OT recommends [facility]. PT recommends SNF.    Diet: Renal IVF: None VTE: Heparin Code: Full   Dispo: Anticipated discharge to SNF in 2 days pending clinical improvement.   Starlyn Skeans, MD 04/11/2022, 6:25 AM Pager: [phone] After 5pm on weekdays and 1pm on weekends: On Call pager [phone]

## 2022-04-11 NOTE — Progress Notes (Signed)
[facility] Progress Note   Subjective:   Seen in room. Do not see any documentation of PD last night. Pt reports abdomen feels "lousy," sore but no nausea or vomiting. Denies SOB, CP, dizziness.   Objective Vitals:   04/10/22 1729 04/10/22 2104 04/11/22 0614 04/11/22 0750  BP: 129/70 133/78 (!) 157/73 (!) 136/58  Pulse: 61 62 70 66  Resp: '20 19 19 17  '$ Temp: 98 F (36.7 C) 97.6 F (36.4 C) 98.3 F (36.8 C) 98 F (36.7 C)  TempSrc: Oral Oral Oral   SpO2: 96% 93% 94% 97%  Weight:      Height:       Physical Exam General: alert male in NAD Heart: RRR, no murmurs, rubs or gallops Lungs: CTA bilaterally without wheezing, rhonchi or rales Abdomen: Soft, non-tender, non-distended, +BS Extremities: No edema b/l lower extremities Dialysis Access: PD cath without erythema/drainage  Additional Objective Labs: Basic Metabolic Panel: Recent Labs  Lab 04/09/22 1036 04/10/22 0322 04/11/22 0202  NA 133* 135 135  K 3.2* 3.5 3.5  CL 96* 99 96*  CO2 '28 25 26  '$ GLUCOSE 124* 114* 121*  BUN 44* 43* 51*  CREATININE 6.03* 6.44* 7.37*  CALCIUM 9.9 9.9 9.5   Liver Function Tests: Recent Labs  Lab 04/09/22 1036 04/10/22 0322  AST 19 22  ALT 20 20  ALKPHOS 75 58  BILITOT 0.6 0.5  PROT 6.8 6.2*  ALBUMIN 3.1* 2.8*   Recent Labs  Lab 04/09/22 1036  LIPASE 34   CBC: Recent Labs  Lab 04/09/22 1036 04/10/22 0322  WBC 10.5 7.6  NEUTROABS 8.4* 4.5  HGB 12.2* 12.0*  HCT 35.6* 35.6*  MCV 104.7* 106.6*  PLT 175 156   Blood Culture    Component Value Date/Time   SDES PERITONEAL DIALYSATE 04/09/2022 2039   SPECREQUEST NONE 04/09/2022 2039   CULT PENDING 04/09/2022 2039   REPTSTATUS PENDING 04/09/2022 2039    Cardiac Enzymes: No results for input(s): "CKTOTAL", "CKMB", "CKMBINDEX", "TROPONINI" in the last 168 hours. CBG: Recent Labs  Lab 04/09/22 2225 04/10/22 0801 04/10/22 1154 04/10/22 1732 04/11/22 0843  GLUCAP 122* 135* 131* 117* 126*   Iron  Studies: No results for input(s): "IRON", "TIBC", "TRANSFERRIN", "FERRITIN" in the last 72 hours. '@lablastinr3'$ @ Studies/Results: CT Head Wo Contrast  Result Date: 04/09/2022 CLINICAL DATA:  Provided history: Mental status change, unknown cause. EXAM: CT HEAD WITHOUT CONTRAST TECHNIQUE: Contiguous axial images were obtained from the base of the skull through the vertex without intravenous contrast. RADIATION DOSE REDUCTION: This exam was performed according to the departmental dose-optimization program which includes automated exposure control, adjustment of the mA and/or kV according to patient size and/or use of iterative reconstruction technique. COMPARISON:  Head CT 11/17/2019. FINDINGS: Brain: Mild-to-moderate cerebral atrophy. Advanced patchy and ill-defined hypoattenuation within the cerebral white matter, nonspecific but compatible with chronic small vessel ischemic disease. Tiny chronic lacunar infarct within the left thalamocapsular junction, new from the prior head CT. There is no acute intracranial hemorrhage. No demarcated cortical infarct. No extra-axial fluid collection. No evidence of an intracranial mass. No midline shift. Vascular: No hyperdense vessel. Atherosclerotic calcifications. Skull: No fracture or aggressive osseous lesion. Sinuses/Orbits: No mass or acute finding within the imaged orbits. Small mucous retention cyst, and background mild mucosal thickening, within the right maxillary sinus at the imaged levels. 14 mm mucous retention cyst within the left maxillary sinus at the imaged levels. Minimal mucosal thickening and fluid scattered within the bilateral ethmoid air cells. Trace mucosal  thickening within the left frontoethmoidal recess. IMPRESSION: 1. No evidence of acute intracranial abnormality. 2. Parenchymal atrophy and chronic small vessel ischemic disease, as described. 3. Paranasal sinus disease at the imaged levels, as described. Electronically Signed   By: Kellie Simmering  D.O.   On: 04/09/2022 15:05   CT ABDOMEN PELVIS WO CONTRAST  Result Date: 04/09/2022 CLINICAL DATA:  Abdominal pain, nonlocalized, increased weakness and back pain, history of abdominal aortic aneurysm, diverticulosis, asthma, atrial flutter, coronary artery disease post MI, COPD, chronic kidney disease, type II diabetes mellitus, hypertension EXAM: CT ABDOMEN AND PELVIS WITHOUT CONTRAST TECHNIQUE: Multidetector CT imaging of the abdomen and pelvis was performed following the standard protocol without IV contrast. RADIATION DOSE REDUCTION: This exam was performed according to the departmental dose-optimization program which includes automated exposure control, adjustment of the mA and/or kV according to patient size and/or use of iterative reconstruction technique. COMPARISON:  10/08/2020 FINDINGS: Lower chest: Bibasilar atelectasis. Upper normal size of cardiac chambers with pacemaker leads RIGHT atrium and RIGHT ventricle. Coronary arterial calcifications noted. Hepatobiliary: Gallbladder surgically absent. Liver normal appearance. Pancreas: Normal appearance Spleen: Normal appearance Adrenals/Urinary Tract: Adrenal glands normal appearance. BILATERAL renal cortical thinning. Tiny calculi in both kidneys. Exophytic nodule upper pole LEFT kidney 18 x 15 mm image 27, stable size, 22 HU previously 30, stable. No additional renal mass, hydronephrosis, or hydroureter. Bladder unremarkable. Stomach/Bowel: Appendix not visualized. Stomach decompressed, with suboptimal assessment of proximal wall thickness. Large and small bowel loops unremarkable. Vascular/Lymphatic: Extensive atherosclerotic calcifications aorta, visceral arteries, iliac arteries, femoral arteries, coronary arteries. Fusiform aneurysmal dilatation distal abdominal aorta 3.7 x 3.7 cm unchanged, extending into aortic bifurcation with RIGHT common iliac artery 21 mm diameter and LEFT common iliac artery 16 mm diameter. Significant plaque at origins of  celiac artery, SMA, and BILATERAL renal arteries. No adenopathy. Reproductive: Question prior urolift. Other: Peritoneal dialysis catheter with ascites/dialysate. No free air. No hernia. Musculoskeletal: Osseous demineralization. Prior lumbar fusion and LEFT femoral ORIF. Old compression fracture L2 with vertebroplasty. IMPRESSION: Peritoneal dialysis catheter with ascites/dialysate. BILATERAL renal cortical thinning with tiny nonobstructing BILATERAL renal calculi. Stable 18 x 15 mm exophytic nodule upper pole LEFT kidney, 22 x 15 mm. Stable aneurysmal dilatation of distal abdominal aorta 3.7 x 3.7 cm; recommend follow-up every 2 years. Reference: J Am Coll Radiol 2248;25:003-704. Extensive atherosclerotic disease changes including coronary arteries and origins of visceral arteries. No acute intra-abdominal or intrapelvic abnormalities. Aortic Atherosclerosis (ICD10-I70.0). Aortic aneurysm NOS (ICD10-I71.9). Electronically Signed   By: Lavonia Dana M.D.   On: 04/09/2022 13:31   DG Chest Port 1 View  Result Date: 04/09/2022 CLINICAL DATA:  Possible sepsis. EXAM: PORTABLE CHEST 1 VIEW COMPARISON:  Oct 10, 2021. FINDINGS: Stable cardiomediastinal silhouette. Left-sided pacemaker is unchanged in position. Mild bibasilar opacities are noted concerning for subsegmental atelectasis or scarring. Bony thorax is unremarkable. IMPRESSION: Mild bibasilar subsegmental atelectasis or scarring. Electronically Signed   By: Marijo Conception M.D.   On: 04/09/2022 10:16   Medications:  dialysis solution 1.5% low-MG/low-CA     dialysis solution 2.5% low-MG/low-CA      aspirin EC  81 mg Oral Daily   atorvastatin  40 mg Oral Daily   Chlorhexidine Gluconate Cloth  6 each Topical Daily   clopidogrel  75 mg Oral Daily   gentamicin cream  1 Application Topical Daily   heparin  5,000 Units Subcutaneous Q8H   insulin aspart  0-6 Units Subcutaneous TID WC   lidocaine  1 patch Transdermal Q24H  melatonin  5 mg Oral QHS    multivitamin  1 tablet Oral Daily   pantoprazole  40 mg Oral Daily   risperiDONE  0.5 mg Oral QHS   rOPINIRole  1 mg Oral QHS   senna-docusate  2 tablet Oral BID   sertraline  25 mg Oral Daily    Assessment/Plan: # Abdominal pain:  - PD fluid with no WBC or organisms seen, not suggestive of peritonitis    # ESRD - Continue PD tonight here.  He appears to have a regimen of 1.5% and 2.5% dextrose - will try this tonight.   - Follows with [facility] and at [facility] - will need to obtain his PD rx   # HTN - controlled on current regimen    # Heart failure with preserved EF  - compensated  - optimize volume with PD as above    # Anemia of CKD  - Mild - no indication for ESA at this time    # Hypokalemia - note on daily supplement at home - would at first trend his labs to determine if needs have changed with acute illness (no PD overnight 93/55 pm)   # Metabolic bone disease - Note this patient is on prolia -would discontinue given ESRD and risk for severe hypocalcemia  Anice Paganini, PA-C 04/11/2022, 8:48 AM  [facility] Pager: [phone]

## 2022-04-11 NOTE — Progress Notes (Signed)
PD tx initation note:   Pre TX VS:   Pre TX weight: 123.8 KG  PD treatment initiated via aseptic technique. Consent signed and in chart. Patient is alert and oriented. No complaints of pain. No specimen collected. PD exit site clean, dry and intact. Gentamycin and new dressing applied. Bedside RN educated on PD machine and how to contact tech support when PD machine alarms.

## 2022-04-11 NOTE — Progress Notes (Addendum)
Subjective: Paged by nursing staff for increased somnolence. Went to reassess the patient. Patient was able to wake up and correctly tell me his name, his location, the year, the television program that was on, and the name of his wife who was also present in the room. Patient then ate a peanut butter jelly sandwich and drank [facility]. We were able to get the patient up from his bed into his chair safely. Patient not complaining of any other symptoms. Wife states that the patient does not take NSAIDs at home for pain.    Objective: Constitutional: Chronically ill-appearing, resting in bed, on room air HENT: mucous membranes moist Cardiovascular: regular rate and rhythm, no murmurs, rubs, or gallops Pulmonary: mild wheezing in all lung fields  Abdominal: Soft, mildly distended, epigastric tenderness Neurological: alert & oriented x 3 Skin: warm and dry Psych: Normal mood and affect  Plan: We ordered POCT glucose which was normal. Oxygen saturation was appropriate on room air. After waking the patient up, he began acting appropriately. I suspect that the patient has been having difficulty sleeping while in the hospital secondary to pain. Will continue to have the patient work with PT.  -Continue to monitor CBGs -Continue to monitor mental status -Delirium precautions ordered

## 2022-04-12 ENCOUNTER — Inpatient Hospital Stay (HOSPITAL_COMMUNITY): Payer: Medicare Other

## 2022-04-12 DIAGNOSIS — R1084 Generalized abdominal pain: Secondary | ICD-10-CM | POA: Diagnosis not present

## 2022-04-12 DIAGNOSIS — G589 Mononeuropathy, unspecified: Secondary | ICD-10-CM | POA: Diagnosis not present

## 2022-04-12 LAB — TROPONIN I (HIGH SENSITIVITY)
Troponin I (High Sensitivity): 38 ng/L — ABNORMAL HIGH (ref <18)
Troponin I (High Sensitivity): 36 ng/L — ABNORMAL HIGH (ref <18)

## 2022-04-12 LAB — GLUCOSE, CAPILLARY
Glucose-Capillary: 114 mg/dL — ABNORMAL HIGH (ref 70–99)
Glucose-Capillary: 120 mg/dL — ABNORMAL HIGH (ref 70–99)
Glucose-Capillary: 150 mg/dL — ABNORMAL HIGH (ref 70–99)
Glucose-Capillary: 133 mg/dL — ABNORMAL HIGH (ref 70–99)
Glucose-Capillary: 200 mg/dL — ABNORMAL HIGH (ref 70–99)

## 2022-04-12 LAB — BASIC METABOLIC PANEL
Anion gap: 13 (ref 5–15)
BUN: 57 mg/dL — ABNORMAL HIGH (ref 8–23)
CO2: 28 mmol/L (ref 22–32)
Calcium: 9.3 mg/dL (ref 8.9–10.3)
Chloride: 95 mmol/L — ABNORMAL LOW (ref 98–111)
Creatinine, Ser: 7.45 mg/dL — ABNORMAL HIGH (ref 0.61–1.24)
GFR, Estimated: 7 mL/min — ABNORMAL LOW (ref >60)
Glucose, Bld: 177 mg/dL — ABNORMAL HIGH (ref 70–99)
Potassium: 3 mmol/L — ABNORMAL LOW (ref 3.5–5.1)
Sodium: 136 mmol/L (ref 135–145)

## 2022-04-12 LAB — MAGNESIUM: Magnesium: 2.5 mg/dL — ABNORMAL HIGH (ref 1.7–2.4)

## 2022-04-12 MED ORDER — IPRATROPIUM-ALBUTEROL 0.5-2.5 (3) MG/3ML IN SOLN: 3.0000 mL | Freq: Four times a day (QID) | RESPIRATORY_TRACT | Status: DC | PRN

## 2022-04-12 MED ORDER — POTASSIUM CHLORIDE 20 MEQ PO PACK
40.0000 meq | PACK | Freq: Two times a day (BID) | ORAL | Status: DC
  Filled 2022-04-12: qty 2

## 2022-04-12 MED ORDER — LIDOCAINE 5 % EX PTCH
2.0000 | MEDICATED_PATCH | CUTANEOUS | Status: DC
  Administered 2022-04-12 – 2022-04-13 (×2): 2 via TRANSDERMAL
  Filled 2022-04-12 (×2): qty 2

## 2022-04-12 MED ORDER — POTASSIUM CHLORIDE 20 MEQ PO PACK
20.0000 meq | PACK | Freq: Once | ORAL | Status: AC
  Administered 2022-04-12: 20 meq via ORAL

## 2022-04-12 MED ORDER — IPRATROPIUM-ALBUTEROL 0.5-2.5 (3) MG/3ML IN SOLN
3.0000 mL | Freq: Four times a day (QID) | RESPIRATORY_TRACT | Status: DC
  Administered 2022-04-12: 3 mL via RESPIRATORY_TRACT
  Filled 2022-04-12 (×2): qty 3

## 2022-04-12 NOTE — Progress Notes (Signed)
Subjective:   Patient continues to have diffuse abdominal pain.  He endorses having several bowel movements yesterday.  He is currently hungry and would like to eat. Patient denies nausea or vomiting.  Discussed unrevealing workup thus far.  Wife is present on the phone and states that the patient experienced similar abdominal pain when he had a heart attack in the past. Discussed reassuring troponin and EKG findings with the patient and his wife. Discussed plan to order repeat troponin. Discussed plan to reassess his pain regimen today and get him up to his chair.  Patient is also experiencing shortness of breath and wheezing.  Discussed plan to have another breathing treatment this morning.  Discussed plan to attempt to wean off of oxygen today if his breathing status improves.  Discussed plan to replete his potassium today.  Objective:  Vital signs in last 24 hours: Vitals:   04/11/22 2010 04/11/22 2116 04/12/22 0500 04/12/22 0508  BP: 137/66   139/60  Pulse: 61   (!) 59  Resp: 20   18  Temp: 98.1 F (36.7 C)   98.2 F (36.8 C)  TempSrc: Oral   Oral  SpO2: 96%   93%  Weight:  123.8 kg 120.8 kg   Height:       Physical Exam: Constitutional: Chronically ill-appearing, laying in bed, on 1 L nasal cannula HENT: mucous membranes moist Cardiovascular: regular rate and rhythm, no murmurs, rubs, or gallops Pulmonary: Audible wheezing, diffuse inspiratory and expiratory wheezing in all lung fields Abdominal: Soft, mildly distended, diffuse tenderness in all four quadrants, normal bowel sounds Neurological: alert & oriented x 3 Skin: warm and dry Psych: Normal mood and affect  Assessment/Plan:  Principal Problem:   Abdominal pain Active Problems:   ESRD on peritoneal dialysis ([place])  [name] is a 71 y.o. male with h/o HTN, DM, CAD, atrial flutter, ESRD (on peritoneal dialysis) HLD, anxiety, OSA, COPD, osteomyelitis, Buerger's disease,and  pacemaker placement presented with  generalized weakness, abdominal pain and back pain admitted for suspected peritonitis.    #Undifferentiated abdominal pain Initially presented w/ 2 days of abdominal pain. Patient had leukocytosis and elevated creatinine on admission. With normal lipase and lactic acid, low suspicion for pancreatitis or chronic mesenteric ischemia at this time. Peritoneal fluid analysis WNL. Low suspicion for peritonitis at this time. Blood cultures negative. CT non-specific. KUB demonstrates non-obstructive bowel gas pattern. Low suspicion for constipation as the cause of his abdominal pain at this time. Low suspicion for peptic ulcer disease at this time given lack of NSAID use at home. The cause of the patients abdominal pain is currently unclear but will likely resolve given reassuring labs, imaging, and vitals. Patient received maalox and viscous lidocaine yesterday with minimal relief of his symptoms. Patient had 3 stools yesterday.  He is eating and drinking appropriately. Abdominal pain is unchanged today. On exam, patient has diffuse abdominal tenderness in all quadrants.  Patient does have a history of irritable bowel syndrome. Could consider starting Bentyl. Will try lidocaine patch for pain at this time.  -Continue supportive care (tylenol, bisacodyl PRN, protonix 40 mg) -Discontinued senna and miralax -Renal diet -Troponin ordered    #ESRD on peritoneal dialysis Began PD 1 year ago. 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 Patient received PD last night and tolerated well. Nephrology is comfortable continuing with PD as an outpatient.  -Continue PD  #Acute hypoxic respiratory failure Initially presented w/ SOB. Suspected to be secondary to volume overload in the setting of inability  to undergo PD due to his abdominal pain. Initially required 4L [place] but is currently satting > 95% on 1L.  Patient states that his breathing is unchanged today.  On exam, patient has diffuse wheezing. Will continue with  DuoNebs. -Continue Duonebs q6 hours -Continue incentive spirometry -Continue to monitor respiratory status  #Hypokalemia Potassium low at 3.0 this morning. Will replete with oral potassium today.  Magnesium mildly elevated at 2.5 today. - Trend BMP and Mg2+   #CAD #Hyperlipidemia Chronic.  -Continue 81 mg aspirin daily -Continue Plavix 75 mg daily -Continue atorvastatin 75 mg daily    #HTN Hypertension has resolved after restarting home medications.  -Continue home carvedilol 25 mg BID -Continue home amlodipine 10 mg daily   #Type 2 diabetes mellitus with diabetic neuropathy A1c currently at goal at 5.4. Diet controlled at home. CBGs remain in the 100s.  -Continue sliding scale insulin   #Anxiety/depression Takes Zoloft 25 mg daily and risperidone 0.5 mg daily at home.  -Continue Zoloft 25 mg daily -Continue risperidone 0.5 mg daily at bedtime -Melatonin 5 mg daily at bedtime   #Restless leg syndrome -Continue ropinirole 1 mg daily   #Chronic back pain -PT/OT -Lidocaine patch ordered   #Dispo OT recommends [facility] OT. PT recommends SNF.    Diet: Renal IVF: None VTE: Heparin Code: Full   Dispo: Anticipated discharge to SNF in 2 days pending clinical improvement.   Starlyn Skeans, MD 04/12/2022, 5:29 AM Pager: [phone] After 5pm on weekdays and 1pm on weekends: On Call pager [phone]

## 2022-04-12 NOTE — NC FL2 (Signed)
[place] LEVEL OF CARE SCREENING TOOL     IDENTIFICATION  Patient Name: [name] Birthdate: 09-Mar-1951 Sex: male Admission Date (Current Location): 04/09/2022  [facility] and [place] Number:  Herbalist and Address:  [facility], [place] [address], [place], [place] [address]      Provider Number:    Attending Physician Name and Address:  Axel Filler, *  Relative Name and Phone Number:  Cedar Ditullio, [phone]    Current Level of Care: Hospital Recommended Level of Care: [place] Prior Approval Number:    Date Approved/Denied:   PASRR Number: [place] resident  Discharge Plan:      Current Diagnoses: Patient Active Problem List   Diagnosis Date Noted   ESRD on peritoneal dialysis ([place]) 04/10/2022   Abdominal pain 04/09/2022   NSTEMI (non-ST elevated myocardial infarction) ([place]) 10/10/2021   Restless leg syndrome 10/10/2021   Depression 10/10/2021   Obesity (BMI 30-39.9) 10/10/2021   Erectile dysfunction due to arterial insufficiency 06/06/2021   Colitis 10/08/2020   OAB (overactive bladder) 06/16/2020   Recurrent UTI 06/16/2020   Incomplete emptying of bladder 03/17/2020   Benign prostatic hyperplasia with urinary obstruction 03/17/2020   Difficulty urinating 02/16/2020   History of non-ST elevation myocardial infarction (NSTEMI) 07/22/2019   Renal mass 07/14/2019   Renal anasarca    ESRD (end stage renal disease) ([place]) 04/10/2019   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/18/2018   Diffuse wheezing 12/02/2017   OSA and COPD overlap syndrome ([place]) 12/02/2017   Sleeps in sitting position due to orthopnea 12/02/2017   Pacemaker 12/02/2017   Bradycardia with 31-40 beats per minute 12/02/2017   Coronary artery disease due to calcified coronary lesion 12/02/2017   Insomnia due to medical condition 12/02/2017   Snoring 12/02/2017   Chronic diastolic CHF (congestive heart failure) ([place])  05/10/2017   Atypical chest pain    Chest pain 05/09/2017   Atrial flutter ([place]) 05/09/2017   Acute renal failure superimposed on stage 4 chronic kidney disease ([place]) 05/09/2017   Hypokalemia 04/05/2016   Diabetes mellitus with nephropathy ([place]) 04/05/2016   Hyponatremia 04/05/2016   Hematuria 04/05/2016   Diarrhea 04/05/2016   Bilateral diabetic foot ulcer associated with secondary diabetes mellitus ([place]) 04/05/2016   Streptococcal bacteremia 04/05/2016   Cellulitis of leg, left 04/05/2016   Sepsis ([place]) 04/04/2016   CAD (coronary artery disease) 11/10/2015   Bilateral lower extremity edema 07/25/2015   Foot infection 09/27/2014   Diabetic foot infection ([place]) 09/27/2014   Diabetes mellitus with renal manifestations, uncontrolled ([place])    Benign essential HTN    ED (erectile dysfunction) 06/09/2012   Hypertension 03/11/2011   OSTEOMYELITIS, ACUTE, ANKLE/FOOT 07/24/2010   EDEMA 02/15/2010   MORBID OBESITY 07/05/2009   TOBACCO ABUSE 07/05/2009   OBSTRUCTIVE SLEEP APNEA 07/05/2009   COPD (chronic obstructive pulmonary disease) ([place]) 07/05/2009   AODM 03/30/2008   Mixed hyperlipidemia 03/30/2008   Generalized anxiety disorder 03/30/2008   BUERGER'S DISEASE 03/30/2008    Orientation RESPIRATION BLADDER Height & Weight     Self, Time, Situation, Place  Normal Continent Weight: 266 lb 5.1 oz (120.8 kg) Height:  '6\' 4"'$  (193 cm)  BEHAVIORAL SYMPTOMS/MOOD NEUROLOGICAL BOWEL NUTRITION STATUS      Continent Diet (see d/c plans)  AMBULATORY STATUS COMMUNICATION OF NEEDS Skin   Limited Assist Verbally Normal  Personal Care Assistance Level of Assistance  Bathing, Feeding, Dressing Bathing Assistance: Maximum assistance Feeding assistance: Independent Dressing Assistance: Maximum assistance     Functional Limitations Info             [place]  PT (By licensed PT), OT (By licensed OT)     PT Frequency: 5 days/week OT  Frequency: 5 days/week            Contractures Contractures Info: Not present    Additional Factors Info  Allergies, Code Status Code Status Info: Full Code Allergies Info: Hydrocodone  Hydrocodone-acetaminophen  Other  Oxycodone-acetaminophen           Current Medications (04/12/2022):  This is the current hospital active medication list Current Facility-Administered Medications  Medication Dose Route Frequency Provider Last Rate Last Admin   acetaminophen (TYLENOL) tablet 650 mg  650 mg Oral Q6H PRN Idamae Schuller, MD       Or   acetaminophen (TYLENOL) suppository 650 mg  650 mg Rectal Q6H PRN Idamae Schuller, MD       amLODipine (NORVASC) tablet 10 mg  10 mg Oral Daily Mapp, Tavien, MD   10 mg at 04/12/22 4098   aspirin EC tablet 81 mg  81 mg Oral Daily Idamae Schuller, MD   81 mg at 04/12/22 0933   atorvastatin (LIPITOR) tablet 40 mg  40 mg Oral Daily Idamae Schuller, MD   40 mg at 04/12/22 0933   bisacodyl (DULCOLAX) EC tablet 5 mg  5 mg Oral Daily PRN Idamae Schuller, MD   5 mg at 04/10/22 2051   carvedilol (COREG) tablet 25 mg  25 mg Oral BID WC Mapp, Tavien, MD   25 mg at 04/12/22 0846   Chlorhexidine Gluconate Cloth 2 % PADS 6 each  6 each Topical Daily Campbell Riches, MD   6 each at 04/12/22 0935   clopidogrel (PLAVIX) tablet 75 mg  75 mg Oral Daily Idamae Schuller, MD   75 mg at 04/12/22 1191   dialysis solution 1.5% low-MG/low-CA dianeal solution   Intraperitoneal Q24H Claudia Desanctis, MD       dialysis solution 2.5% low-MG/low-CA dianeal solution   Intraperitoneal Q24H Claudia Desanctis, MD       gentamicin cream (GARAMYCIN) 0.1 % 1 Application  1 Application Topical Daily Claudia Desanctis, MD   1 Application at 47/82/95 0935   heparin injection 5,000 Units  5,000 Units Subcutaneous Q8H Idamae Schuller, MD   5,000 Units at 04/12/22 0659   insulin aspart (novoLOG) injection 0-6 Units  0-6 Units Subcutaneous TID WC Idamae Schuller, MD       ipratropium-albuterol (DUONEB) 0.5-2.5 (3) MG/3ML  nebulizer solution 3 mL  3 mL Nebulization Q6H PRN Iona Coach, MD   3 mL at 04/12/22 0932   lidocaine (LIDODERM) 5 % 1 patch  1 patch Transdermal Q24H Multani, Bhupinder, MD   1 patch at 04/11/22 2339   lidocaine (LIDODERM) 5 % 2 patch  2 patch Transdermal Q24H Idamae Schuller, MD       melatonin tablet 5 mg  5 mg Oral QHS Idamae Schuller, MD   5 mg at 04/11/22 2228   multivitamin (RENA-VIT) tablet 1 tablet  1 tablet Oral Daily Idamae Schuller, MD   1 tablet at 04/12/22 0934   pantoprazole (PROTONIX) EC tablet 40 mg  40 mg Oral Daily Idamae Schuller, MD   40 mg at 04/12/22 0933   risperiDONE (RISPERDAL) tablet 0.5 mg  0.5 mg Oral  QHS Idamae Schuller, MD   0.5 mg at 04/11/22 2228   rOPINIRole (REQUIP) tablet 1 mg  1 mg Oral QHS Idamae Schuller, MD   1 mg at 04/11/22 2228   sertraline (ZOLOFT) tablet 25 mg  25 mg Oral Daily Idamae Schuller, MD   25 mg at 04/12/22 4599     Discharge Medications: Please see discharge summary for a list of discharge medications.  Relevant Imaging Results:  Relevant Lab Results:   Additional Information [id number]; Pt is on HD at  [facility] in [place], [place], [place]

## 2022-04-12 NOTE — Progress Notes (Signed)
Occupational Therapy Treatment Patient Details Name: [name] MRN: [id number] DOB: Jul 05, 1950 Today's Date: 04/12/2022   History of present illness Pt is 71 y.o. male admitted 04/09/22  to the ER at [facility] with abdominal pain concerning for peritonitis, transferred to [facility]. Pt with a history of ESRD on PD, CAD, HF with preserved EF, COPD, PD, and HTN   OT comments  Patient progressing and showed improved tolerance to EOB ADLs, standing ADLs but with Total Assist as pt requires BUE support on RW for safety, and with endurance performing 6 reps of Sit<>stands from EOB with Moderate, improving to Minimal assist.  Pt an be slow to respond and unsure if this is hearing or processing.  Patient remains limited by pain to stomach and back, generalized weakness and decreased activity tolerance along with deficits noted below.  Due to pt requiring Moderate assist for bed mobility and up to moderate assist for transfers, OT agrees with recommendations for post acute inpatient therapy and pt and spouse report selecting a SNF in [place], [place]. Pt continues to demonstrate good rehab potential and would benefit from continued skilled OT to increase safety and independence with ADLs and functional transfers to allow pt to return home safely and reduce caregiver burden and fall risk.    Recommendations for follow up therapy are one component of a multi-disciplinary discharge planning process, led by the attending physician.  Recommendations may be updated based on patient status, additional functional criteria and insurance authorization.    Follow Up Recommendations  Skilled nursing-short term rehab (<3 hours/day)     Assistance Recommended at Discharge Intermittent Supervision/Assistance  Patient can return home with the following  Help with stairs or ramp for entrance;Assist for transportation;Assistance with cooking/housework;A lot of help with walking and/or transfers;A lot of help with  bathing/dressing/bathroom   Equipment Recommendations  Other (comment) (May benefit from long handled adaptive equipment if does not already have)    Recommendations for Other Services      Precautions / Restrictions Precautions Precautions: Fall Precaution Comments: Prior back injuries and surgeries. Restrictions Weight Bearing Restrictions: No       Mobility Bed Mobility Overal bed mobility: Needs Assistance Bed Mobility: Rolling, Sidelying to Sit Rolling: Min guard Sidelying to sit: Mod assist       General bed mobility comments: HOB mostly flat, use of rails, cues for sequencing for sidelying to sit.    Transfers                         Balance Overall balance assessment: Needs assistance Sitting-balance support: Feet supported, Single extremity supported Sitting balance-Leahy Scale: Good     Standing balance support: Bilateral upper extremity supported, Reliant on assistive device for balance Standing balance-Leahy Scale: Poor                             ADL either performed or assessed with clinical judgement   ADL Overall ADL's : Needs assistance/impaired     Grooming: Wash/dry hands;Wash/dry face;Set up;Sitting               Lower Body Dressing: Minimal assistance;Sitting/lateral leans;Cueing for compensatory techniques Lower Body Dressing Details (indicate cue type and reason): Pt able to perform full figure 4 position at EOB to LLE and doff/don sock with setup only. Pt unable to complete full figure 4 to RLE with increased challenge reaching toes to don. OT performed bakward chaining  of sock just over toes, then pt able to complete to don. Toilet Transfer: Minimal assistance;Moderate assistance;Rolling walker (2 wheels);Ambulation;Cueing for safety Toilet Transfer Details (indicate cue type and reason): Pt performed 6 sit to stands with initial Mod As from elevated EOB. Then Min As. Bed then lowered and pt continued 4 more with  Min As and reast break needed in between each. SpO2 >92% and HR: 60s to low 70s. On 6th stand pt ambulated with RW and Min As to recliner ~6'. Toileting- Water quality scientist and Hygiene: Sit to/from stand;Total assistance Toileting - Clothing Manipulation Details (indicate cue type and reason): Pt able to stand with RW and Min guard assist. Noted to be soiled. OT provided Total Assist peri hygiene  as pt not yet comfortable releasing RW with one hand.     Functional mobility during ADLs: Minimal assistance;Moderate assistance;Rolling walker (2 wheels)      Extremity/Trunk Assessment Upper Extremity Assessment Upper Extremity Assessment: Generalized weakness   Lower Extremity Assessment Lower Extremity Assessment: Generalized weakness   Cervical / Trunk Assessment Cervical / Trunk Assessment: Kyphotic;Back Surgery    Vision Baseline Vision/History: 1 Wears glasses Patient Visual Report: No change from baseline     Perception     Praxis      Cognition Arousal/Alertness: Awake/alert Behavior During Therapy: Flat affect   Area of Impairment: Attention, Following commands, Safety/judgement, Awareness, Problem solving                   Current Attention Level: Selective   Following Commands: Follows one step commands consistently, Follows multi-step commands inconsistently, Follows multi-step commands with increased time   Awareness: Emergent Problem Solving: Slow processing, Requires verbal cues General Comments: Seems hard of hearing and needs some instructions repeated but once OT raises voice pt follows commands well. May be some mildly delayed processing as well.        Exercises      Shoulder Instructions       General Comments      Pertinent Vitals/ Pain       Pain Assessment Pain Assessment: 0-10 Pain Score: 5  Pain Location: Abdomen also 3/10 back pain. Pain Descriptors / Indicators: Guarding Pain Intervention(s): Repositioned, Monitored during  session, Limited activity within patient's tolerance  Home Living                                          Prior Functioning/Environment              Frequency  Min 2X/week        Progress Toward Goals  OT Goals(current goals can now be found in the care plan section)  Progress towards OT goals: Progressing toward goals  Acute Rehab OT Goals Patient Stated Goal: Pain to decrease and strength to increase. OT Goal Formulation: With patient/family Time For Goal Achievement: 04/24/22 Potential to Achieve Goals: Good  Plan Discharge plan needs to be updated    Co-evaluation                 AM-PAC OT "6 Clicks" Daily Activity     Outcome Measure   Help from another person eating meals?: None Help from another person taking care of personal grooming?: A Little Help from another person toileting, which includes using toliet, bedpan, or urinal?: A Lot Help from another person bathing (including washing, rinsing, drying)?: A Lot Help from another  person to put on and taking off regular upper body clothing?: A Little Help from another person to put on and taking off regular lower body clothing?: A Lot 6 Click Score: 16    End of Session Equipment Utilized During Treatment: Gait belt;Rolling walker (2 wheels);Oxygen  OT Visit Diagnosis: Unsteadiness on feet (R26.81);Muscle weakness (generalized) (M62.81)   Activity Tolerance Patient limited by pain   Patient Left in chair;with call bell/phone within reach;with family/visitor present   Nurse Communication Other (comment) (RN Okay'ed OT seeing pt)        Time: [phone] OT Time Calculation (min): 34 min  Charges: OT General Charges $OT Visit: 1 Visit OT Treatments $Self Care/Home Management : 8-22 mins $Therapeutic Activity: 8-22 mins  Anderson Malta, OT Acute Rehab Services Office: [phone] 04/12/2022  Julien Girt 04/12/2022, 1:12 PM

## 2022-04-12 NOTE — TOC Progression Note (Signed)
Transition of Care [facility]) - Progression Note    Patient Details  Name: [name] MRN: [id number] Date of Birth: [birth year]  [facility]) CM/SW [place], [place] Phone Number: 04/12/2022, 4:06 PM  Clinical Narrative:    Admissions for [facility] returned call and confirmed they may have a bed available next week. Referral was sent to facility and [facility] will follow up on Monday for determination. Pt's HD needs will be reviewed as well, wife states she can transport. TOC will continue to follow.   Expected Discharge Plan: [place] Barriers to Discharge: SNF Pending bed offer, Transportation, Continued Medical Work up  Expected Discharge Plan and Services Expected Discharge Plan: [place] Choice: [place] arrangements for the past 2 months: Single Family Home                                       Social Determinants of Health (SDOH) Interventions    Readmission Risk Interventions     No data to display

## 2022-04-12 NOTE — Progress Notes (Signed)
Spoke with Romie Minus, LPN. Patient to be kept NPO after midnight for mesenteric ultrasound in the morning. Unable to perform at this time, as patient has not been NPO for the requisite window.   Darlin Coco, RDMS, RVT

## 2022-04-12 NOTE — Progress Notes (Signed)
[facility] Progress Note   Subjective:   Pt seen in room, hooked up to PD machine. Effluent is clear. He denies any abdominal pain, nausea, and SOB this AM. Says he is "fine."  Objective Vitals:   04/11/22 2010 04/11/22 2116 04/12/22 0500 04/12/22 0508  BP: 137/66   139/60  Pulse: 61   (!) 59  Resp: 20   18  Temp: 98.1 F (36.7 C)   98.2 F (36.8 C)  TempSrc: Oral   Oral  SpO2: 96%   93%  Weight:  123.8 kg 120.8 kg   Height:       Physical Exam General: Alert male in NAD Heart: RRR, no murmurs, rubs or gallops Lungs: CTA bilaterally, no wheezing, rhonchi or rales Abdomen: Soft, non-distended, no TTP, +BS. PD cath hooked up to machine Extremities: no edema b/l lower extremities  Additional Objective Labs: Basic Metabolic Panel: Recent Labs  Lab 04/10/22 0322 04/11/22 0202 04/12/22 0228  NA 135 135 136  K 3.5 3.5 3.0*  CL 99 96* 95*  CO2 '25 26 28  '$ GLUCOSE 114* 121* 177*  BUN 43* 51* 57*  CREATININE 6.44* 7.37* 7.45*  CALCIUM 9.9 9.5 9.3   Liver Function Tests: Recent Labs  Lab 04/09/22 1036 04/10/22 0322  AST 19 22  ALT 20 20  ALKPHOS 75 58  BILITOT 0.6 0.5  PROT 6.8 6.2*  ALBUMIN 3.1* 2.8*   Recent Labs  Lab 04/09/22 1036  LIPASE 34   CBC: Recent Labs  Lab 04/09/22 1036 04/10/22 0322  WBC 10.5 7.6  NEUTROABS 8.4* 4.5  HGB 12.2* 12.0*  HCT 35.6* 35.6*  MCV 104.7* 106.6*  PLT 175 156   Blood Culture    Component Value Date/Time   SDES IN/OUT CATH URINE 04/09/2022 2222   SPECREQUEST  04/09/2022 2222    NONE Performed at [facility], [place] [address] [place], [place] [address]    CULT  04/09/2022 2222    CULTURE REINCUBATED FOR BETTER GROWTH 9,000 COLONIES/mL STAPHYLOCOCCUS LUGDUNENSIS 1,000 COLONIES/mL STAPHYLOCOCCUS EPIDERMIDIS 100 COLONIES/mL ENTEROCOCCUS FAECALIS    REPTSTATUS PENDING 04/09/2022 2222     CBG: Recent Labs  Lab 04/10/22 1732 04/11/22 0843 04/11/22 1127 04/11/22 1551 04/11/22 2248   GLUCAP 117* 126* 114* 140* 181*    Studies/Results: DG Abd 1 View  Result Date: 04/11/2022 CLINICAL DATA:  Abdominal pain. EXAM: ABDOMEN - 1 VIEW COMPARISON:  None Available. FINDINGS: The bowel gas pattern is normal. No radio-opaque calculi or other significant radiographic abnormality are seen. Battery device and lead for the spinal stimulator. Vertebral augmentation of the lumbar spine and multiple disc spacer devices in the lower lumbar spine. IMPRESSION: Nonobstructive bowel gas pattern. Electronically Signed   By: Keane Police D.O.   On: 04/11/2022 15:11   Medications:  dialysis solution 1.5% low-MG/low-CA     dialysis solution 2.5% low-MG/low-CA      amLODipine  10 mg Oral Daily   aspirin EC  81 mg Oral Daily   atorvastatin  40 mg Oral Daily   carvedilol  25 mg Oral BID WC   Chlorhexidine Gluconate Cloth  6 each Topical Daily   clopidogrel  75 mg Oral Daily   gentamicin cream  1 Application Topical Daily   heparin  5,000 Units Subcutaneous Q8H   insulin aspart  0-6 Units Subcutaneous TID WC   lidocaine  1 patch Transdermal Q24H   melatonin  5 mg Oral QHS   multivitamin  1 tablet Oral Daily  pantoprazole  40 mg Oral Daily   potassium chloride  40 mEq Oral BID   risperiDONE  0.5 mg Oral QHS   rOPINIRole  1 mg Oral QHS   sertraline  25 mg Oral Daily    Assessment/Plan: # Abdominal pain:  - PD fluid with no WBC or organisms seen, not suggestive of peritonitis - exam nonfocal, says he is feeling better today     # ESRD - Continue PD tonight here.  He appears to have a regimen of 1.5% and 2.5% dextrose - will try this tonight.   - Follows with [place] [facility] and at [facility]   # HTN - controlled on current regimen    # Heart failure with preserved EF  - compensated  - optimize volume with PD as above    # Anemia of CKD  - Mild - no indication for ESA at this time    # Hypokalemia - note on daily supplement at home - K+ 3.0, noted supplementation already  ordered   # Metabolic bone disease - Note this patient is on prolia -would discontinue given ESRD and risk for severe hypocalcemia  Anice Paganini, PA-C 04/12/2022, 8:39 AM  [facility] Pager: [phone]

## 2022-04-12 NOTE — TOC Initial Note (Addendum)
Transition of Care [facility]) - Initial/Assessment Note    Patient Details  Name: [name] MRN: [id number] Date of Birth: Mar 22, 1951  Transition of [facility]) CM/SW Contact:    Jinger Neighbors, LCSW Phone Number: 04/12/2022, 11:41 AM  Clinical Narrative:                 CSW met face to face with pt and his spouse. Pt is AAOx4 and currently resides at home with his wife. Pt agreeable to SNF for ongoing PT and prefers [facility] in [place], where he was for PT approximately 2 yrs ago. CSW called [facility] to inquire about bed availability and left a vm for [employer], Admission coordinator [phone]. If no return call today, will call back Monday.   Expected Discharge Plan: Skilled Nursing Facility Barriers to Discharge: SNF Pending bed offer, Transportation, Continued Medical Work up   Patient Goals and CMS Choice Patient states their goals for this hospitalization and ongoing recovery are:: Patient states, "I want abdominal pain to go away and to get stronger". CMS Medicare.gov Compare Post Acute Care list provided to:: Patient Choice offered to / list presented to : Patient, Spouse  Expected Discharge Plan and Services Expected Discharge Plan: [place] Acute Care Choice: [place] arrangements for the past 2 months: Single Family Home                                      Prior Living Arrangements/Services Living arrangements for the past 2 months: Single Family Home Lives with:: Spouse Patient language and need for interpreter reviewed:: Yes Do you feel safe going back to the place where you live?: Yes      Need for Family Participation in Patient Care: Yes (Comment) Care giver support system in place?: Yes (comment)   Criminal Activity/Legal Involvement Pertinent to Current Situation/Hospitalization: No - Comment as needed  Activities of Daily Living      Permission Sought/Granted Permission sought to share  information with : Family Supports, Chartered certified accountant granted to share information with : Yes, Verbal Permission Granted  Share Information with NAME: Spouse- Faiz Weber  Permission granted to share info w AGENCY: [facility]  Permission granted to share info w Relationship: Spouse  Permission granted to share info w Contact Information: [phone]  Emotional Assessment Appearance:: Appears older than stated age Attitude/Demeanor/Rapport: Apprehensive Affect (typically observed): Adaptable Orientation: : Oriented to Self, Oriented to Place, Oriented to  Time, Oriented to Situation Alcohol / Substance Use: Not Applicable Psych Involvement: No (comment)  Admission diagnosis:  Confusion [K81.2] Periumbilical abdominal pain [R10.33] Generalized weakness [R53.1] Abdominal pain [R10.9] Patient Active Problem List   Diagnosis Date Noted   ESRD on peritoneal dialysis ([place]) 04/10/2022   Abdominal pain 04/09/2022   NSTEMI (non-ST elevated myocardial infarction) ([place]) 10/10/2021   Restless leg syndrome 10/10/2021   Depression 10/10/2021   Obesity (BMI 30-39.9) 10/10/2021   Erectile dysfunction due to arterial insufficiency 06/06/2021   Colitis 10/08/2020   OAB (overactive bladder) 06/16/2020   Recurrent UTI 06/16/2020   Incomplete emptying of bladder 03/17/2020   Benign prostatic hyperplasia with urinary obstruction 03/17/2020   Difficulty urinating 02/16/2020   History of non-ST elevation myocardial infarction (NSTEMI) 07/22/2019   Renal mass 07/14/2019   Renal anasarca    ESRD (end stage renal disease) ([place]) 04/10/2019   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[place]) 03/18/2018   Diffuse wheezing 12/02/2017   OSA and COPD overlap syndrome ([place]) 12/02/2017   Sleeps in sitting position due to orthopnea 12/02/2017   Pacemaker 12/02/2017   Bradycardia with 31-40 beats per minute 12/02/2017   Coronary artery disease due to calcified coronary lesion 12/02/2017    Insomnia due to medical condition 12/02/2017   Snoring 12/02/2017   Chronic diastolic CHF (congestive heart failure) ([place]) 05/10/2017   Atypical chest pain    Chest pain 05/09/2017   Atrial flutter ([place]) 05/09/2017   Acute renal failure superimposed on stage 4 chronic kidney disease ([place]) 05/09/2017   Hypokalemia 04/05/2016   Diabetes mellitus with nephropathy ([place]) 04/05/2016   Hyponatremia 04/05/2016   Hematuria 04/05/2016   Diarrhea 04/05/2016   Bilateral diabetic foot ulcer associated with secondary diabetes mellitus ([place]) 04/05/2016   Streptococcal bacteremia 04/05/2016   Cellulitis of leg, left 04/05/2016   Sepsis ([place]) 04/04/2016   CAD (coronary artery disease) 11/10/2015   Bilateral lower extremity edema 07/25/2015   Foot infection 09/27/2014   Diabetic foot infection ([place]) 09/27/2014   Diabetes mellitus with renal manifestations, uncontrolled ([place])    Benign essential HTN    ED (erectile dysfunction) 06/09/2012   Hypertension 03/11/2011   OSTEOMYELITIS, ACUTE, ANKLE/FOOT 07/24/2010   EDEMA 02/15/2010   MORBID OBESITY 07/05/2009   TOBACCO ABUSE 07/05/2009   OBSTRUCTIVE SLEEP APNEA 07/05/2009   COPD (chronic obstructive pulmonary disease) ([place]) 07/05/2009   AODM 03/30/2008   Mixed hyperlipidemia 03/30/2008   Generalized anxiety disorder 03/30/2008   BUERGER'S DISEASE 03/30/2008   PCP:  Tobe Sos, MD Pharmacy:   [facility] [address], [place] - [address] [place] [address] Phone: [phone] Fax: [phone]  [facility] [address] [place] [address] Phone: [phone] Fax: [place], [place] [address] [place] [address] Phone: [phone] Fax: [phone]     Social Determinants of Health (SDOH) Interventions    Readmission Risk Interventions     No data to display

## 2022-04-13 ENCOUNTER — Inpatient Hospital Stay (HOSPITAL_COMMUNITY): Payer: Medicare Other

## 2022-04-13 DIAGNOSIS — R109 Unspecified abdominal pain: Secondary | ICD-10-CM

## 2022-04-13 DIAGNOSIS — R1084 Generalized abdominal pain: Secondary | ICD-10-CM | POA: Diagnosis not present

## 2022-04-13 DIAGNOSIS — R931 Abnormal findings on diagnostic imaging of heart and coronary circulation: Secondary | ICD-10-CM

## 2022-04-13 DIAGNOSIS — G589 Mononeuropathy, unspecified: Secondary | ICD-10-CM | POA: Diagnosis not present

## 2022-04-13 LAB — BODY FLUID CULTURE W GRAM STAIN
Culture: NO GROWTH
Gram Stain: NONE SEEN

## 2022-04-13 LAB — GLUCOSE, CAPILLARY
Glucose-Capillary: 120 mg/dL — ABNORMAL HIGH (ref 70–99)
Glucose-Capillary: 117 mg/dL — ABNORMAL HIGH (ref 70–99)
Glucose-Capillary: 122 mg/dL — ABNORMAL HIGH (ref 70–99)
Glucose-Capillary: 167 mg/dL — ABNORMAL HIGH (ref 70–99)

## 2022-04-13 LAB — BASIC METABOLIC PANEL
Anion gap: 10 (ref 5–15)
BUN: 56 mg/dL — ABNORMAL HIGH (ref 8–23)
CO2: 28 mmol/L (ref 22–32)
Calcium: 8.8 mg/dL — ABNORMAL LOW (ref 8.9–10.3)
Chloride: 98 mmol/L (ref 98–111)
Creatinine, Ser: 6.98 mg/dL — ABNORMAL HIGH (ref 0.61–1.24)
GFR, Estimated: 8 mL/min — ABNORMAL LOW (ref >60)
Glucose, Bld: 120 mg/dL — ABNORMAL HIGH (ref 70–99)
Potassium: 3.2 mmol/L — ABNORMAL LOW (ref 3.5–5.1)
Sodium: 136 mmol/L (ref 135–145)

## 2022-04-13 LAB — URINE CULTURE: Culture: 20000 — AB

## 2022-04-13 MED ORDER — POTASSIUM CHLORIDE CRYS ER 20 MEQ PO TBCR
40.0000 meq | EXTENDED_RELEASE_TABLET | Freq: Once | ORAL | Status: AC
  Administered 2022-04-13: 40 meq via ORAL
  Filled 2022-04-13: qty 2

## 2022-04-13 NOTE — Progress Notes (Signed)
Went to take pt off his PD tx and found that the night nurse had worked w/ the touch screen to the point they had advanced the screen past the point that gives me all the post tx information to document. So I have no information on UF, drain and dwell times, etc to document. Dr. Joelyn Oms and [place], [place] both made aware

## 2022-04-13 NOTE — Progress Notes (Signed)
Subjective:   No overnight events.  Patient notes that he is still having some back pain and abdominal pain. He did not sleep well at night as the machine kept beeping. Patient is more awake today than he was the previous two days. Patient is still on 1L Nasal cannula. He denies having a bowel movement. He thinks that moving makes his abdominal pain worse. He states that eating does not make his pain better or worse.   Objective:  Vital signs in last 24 hours: Vitals:   04/12/22 2058 04/13/22 0440 04/13/22 0500 04/13/22 0700  BP: (!) 126/58 (!) 142/72    Pulse: 70 (!) 58  70  Resp: 19 16    Temp: 98.3 F (36.8 C) 98.2 F (36.8 C)    TempSrc: Oral Oral    SpO2: 98% 95%    Weight:   121 kg   Height:       Physical Exam: Constitutional: NAD, sleeping in bed upon arrival HENT: NCAT Cardiovascular: NSR, good pulses  Pulmonary: CTAB Abdominal: diffusely tender to palpation, normal bowel sounds Neurological: alert and oriented x4 Extremities: Decreased muscle bulk Psych: depressed mood  Assessment/Plan:  Principal Problem:   Abdominal pain Active Problems:   ESRD on peritoneal dialysis ([place])  [name] is a 71 y.o. male with h/o HTN, DM, CAD, atrial flutter, ESRD (on peritoneal dialysis) HLD, anxiety, OSA, COPD, osteomyelitis, Buerger's disease,and  pacemaker placement presented with generalized weakness, abdominal pain and back pain admitted for suspected peritonitis.    #Undifferentiated abdominal pain Initially presented w/ 2 days of abdominal pain that started after getting his epidural injection. Patient had leukocytosis and elevated creatinine on admission. With normal lipase and lactic acid, low suspicion for pancreatitis or chronic mesenteric ischemia at this time. Peritoneal fluid analysis WNL. Low suspicion for peritonitis at this time. Blood cultures negative. CT non-specific. KUB demonstrates non-obstructive bowel gas pattern. Low suspicion for constipation as  the cause of his abdominal pain at this time. Low suspicion for peptic ulcer disease at this time given lack of NSAID use at home.  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 The pain started 2 days after getting a nerve block. Will continue supportive care and monitor his symptoms. Will need follow up with pain clinic outpatient. Will continue to investigate other etiologies of his abdominal pain but most concerning etiologies have been ruled out.  -Continue supportive care (tylenol, bisacodyl PRN, protonix 40 mg, lidocaine patch.  -Renal diet    #ESRD on peritoneal dialysis Began PD 1 year ago. 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 Nephrology is comfortable continuing with PD as an outpatient.  -Continue PD while inpatient  #Acute hypoxic respiratory failure Initially presented w/ SOB. Suspected to be secondary to volume overload in the setting of inability to undergo PD due to his abdominal pain. Initially required 4L [place] but is currently satting > 95% on 1L.  Patient states that his breathing is improved.  -Continue Duonebs q6 hours -Continue incentive spirometry -Continue to monitor respiratory status  #Hypokalemia Potassium low at 3.2 this morning. Will replete with oral potassium today.   - Trend BMP and Mg2+   #CAD #Hyperlipidemia Chronic.  -Continue 81 mg aspirin  daily -Continue Plavix 75 mg daily -Continue atorvastatin 75 mg daily    #HTN Chronic. BP 114/59 this am. Will continue to monitor.  -Continue home carvedilol 25 mg BID -Continue home amlodipine 10 mg daily   #Type 2 diabetes mellitus with diabetic neuropathy A1c currently at goal at 5.4. Diet  controlled at home. CBGs mostly less less than 140 today.  -Continue sliding scale insulin. Continue very sensitive scale and change to sensitive if glucose is >180 consistently.    #Anxiety/depression Takes Zoloft 25 mg daily and risperidone 0.5 mg daily at home.  -Continue Zoloft 25 mg daily -Continue risperidone 0.5 mg daily at bedtime -Melatonin 5 mg daily at bedtime   #Restless leg syndrome -Continue ropinirole 1 mg daily   #Chronic back pain -PT/OT -Lidocaine patch ordered   #Dispo OT recommends HH OT. PT recommends SNF. Patient has [facility] for [facility].    Diet: Renal IVF: None VTE: Heparin Code: Full   Dispo: Anticipated discharge to SNF in 2 days pending clinical improvement.   Albert Schuller, MD 04/13/2022, 7:45 AM Pager: [phone] After 5pm on weekdays and 1pm on weekends: On Call pager [phone]

## 2022-04-13 NOTE — Progress Notes (Signed)
Mesenteric artery duplex study completed.   Please see CV Proc for preliminary results.   Darlin Coco, RDMS, RVT

## 2022-04-13 NOTE — Progress Notes (Signed)
[facility] Progress Note   Subjective:   S/p mesenteric artery duplex study this AM. Pt tired now. No new concerns reported. Potassium 3.2, ordered another dose of KCl 72mq.   Objective Vitals:   04/13/22 0500 04/13/22 0700 04/13/22 0710 04/13/22 0755  BP:   133/63 (!) 114/59  Pulse:  70 70 70  Resp:   16 16  Temp:   (S) 98 F (36.7 C) 97.8 F (36.6 C)  TempSrc:   Oral   SpO2:   97% 92%  Weight: 121 kg  121.2 kg   Height:       Physical Exam General: Sleeping but awakens to voice. NAD Heart: RRR, no murmur Lungs: CTA bilaterally Abdomen: Non-distended, +BS Extremities:no edema b/l lower extremities Dialysis Access:  PD cath in lower abdomen  Additional Objective Labs: Basic Metabolic Panel: Recent Labs  Lab 04/11/22 0202 04/12/22 0228 04/13/22 0230  NA 135 136 136  K 3.5 3.0* 3.2*  CL 96* 95* 98  CO2 '26 28 28  '$ GLUCOSE 121* 177* 120*  BUN 51* 57* 56*  CREATININE 7.37* 7.45* 6.98*  CALCIUM 9.5 9.3 8.8*   Liver Function Tests: Recent Labs  Lab 04/09/22 1036 04/10/22 0322  AST 19 22  ALT 20 20  ALKPHOS 75 58  BILITOT 0.6 0.5  PROT 6.8 6.2*  ALBUMIN 3.1* 2.8*   Recent Labs  Lab 04/09/22 1036  LIPASE 34   CBC: Recent Labs  Lab 04/09/22 1036 04/10/22 0322  WBC 10.5 7.6  NEUTROABS 8.4* 4.5  HGB 12.2* 12.0*  HCT 35.6* 35.6*  MCV 104.7* 106.6*  PLT 175 156   Blood Culture    Component Value Date/Time   SDES IN/OUT CATH URINE 04/09/2022 2222   SPECREQUEST  04/09/2022 2222    NONE Performed at [facility] [address] [id number]   CULT (A) 04/09/2022 2222    20,000 COLONIES/mL LACTOBACILLUS SPECIES 9,000 COLONIES/mL STAPHYLOCOCCUS LUGDUNENSIS 1,000 COLONIES/mL STAPHYLOCOCCUS EPIDERMIDIS 100 COLONIES/mL ENTEROCOCCUS FAECALIS Standardized susceptibility testing for this organism is not available. LACTOBACILLUS SPECIES    REPTSTATUS 04/13/2022 FINAL 04/09/2022 2222    Cardiac Enzymes: No  results for input(s): "CKTOTAL", "CKMB", "CKMBINDEX", "TROPONINI" in the last 168 hours. CBG: Recent Labs  Lab 04/12/22 1600 04/12/22 1726 04/12/22 2056 04/13/22 0824 04/13/22 1148  GLUCAP 150* 133* 200* 120* 117*   Iron Studies: No results for input(s): "IRON", "TIBC", "TRANSFERRIN", "FERRITIN" in the last 72 hours. '@lablastinr3'$ @ Studies/Results: VAS [place]MESENTERIC  Result Date: 04/13/2022 ABDOMINAL VISCERAL Patient Name:  [name] Date of Exam:   04/13/2022 Medical Rec #: [id number]       Accession #:    [id number] of Birth: 911/11/1950       Patient Gender: M Patient Age:   89years [facility]      VAS [place]ESENTERIC Referring Phys: [id number] [place]-------------------------------------------------------------------------------- Indications: Abdominal pain, history of calcification at the origin of the              celiac and SMA on previous imaging. Other Factors: History of distal abdominal aortic aneurysm. Limitations: Air/bowel gas and obesity. Comparison Study: 07-30-2019 Prior mesenteric artery duplex concerning for                   proximal SMA stenosis with PSV of 451 cm/s.  08-02-2019 Prior visceral angiography showed SMA calcification                   without flow limiting stenosis                   04-09-2022 Prior CT abdomen/pelvis showed extensive                   calcification of aorta and visceral arteries. Significant                   plaque at origin of celiac and SMA. 3.7 cm fusiform AAA. Performing Technologist: Darlin Coco RDMS, RVT  Examination Guidelines: A complete evaluation includes B-mode imaging, spectral Doppler, color Doppler, and power Doppler as needed of all accessible portions of each vessel. Bilateral testing is considered an integral part of a complete examination. Limited examinations for reoccurring indications may be performed as noted.  Duplex Findings:  +---------------------+-------+--------+------+--------------------------------+ Mesenteric             PSV  EDV cm/sPlaque            Comments                                   cm/s                                                 +---------------------+-------+--------+------+--------------------------------+ Aorta Prox             70                                                  +---------------------+-------+--------+------+--------------------------------+ Aorta Mid              43                                                  +---------------------+-------+--------+------+--------------------------------+ Aorta Distal           36                 AAA noted consistent with recent                                                          CT                +---------------------+-------+--------+------+--------------------------------+ Celiac Artery Origin   268                                                 +---------------------+-------+--------+------+--------------------------------+ Celiac Artery          146  Turbulent             Proximal                                                                   +---------------------+-------+--------+------+--------------------------------+ SMA Origin             339                                                 +---------------------+-------+--------+------+--------------------------------+ SMA Proximal           281                                                 +---------------------+-------+--------+------+--------------------------------+ SMA Mid                171                                                 +---------------------+-------+--------+------+--------------------------------+ SMA Distal             155                                                 +---------------------+-------+--------+------+--------------------------------+ CHA                     238                           Turbulent             +---------------------+-------+--------+------+--------------------------------+ Splenic                155                           Turbulent             +---------------------+-------+--------+------+--------------------------------+    Summary: Mesenteric:  Flow velocities suggestive of >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 However, today's examination was limited due to habitus and bowel gas. Flow velocities in the SMA appear decreased as compared to previous examination. Flow velocities in the celiac artery appear increased as compared to previous examination. The inferior mesenteric artery is not visualized on today's examination.  *See table(s) above for measurements and observations.     Preliminary    DG Abd 1 View  Result Date: 04/11/2022 CLINICAL DATA:  Abdominal pain. EXAM: ABDOMEN - 1 VIEW COMPARISON:  None Available. FINDINGS: The bowel gas pattern is normal. No radio-opaque calculi or other significant radiographic abnormality are seen. Battery device and lead for the spinal stimulator.  Vertebral augmentation of the lumbar spine and multiple disc spacer devices in the lower lumbar spine. IMPRESSION: Nonobstructive bowel gas pattern. Electronically Signed   By: Keane Police D.O.   On: 04/11/2022 15:11   Medications:  dialysis solution 1.5% low-MG/low-CA     dialysis solution 2.5% low-MG/low-CA      amLODipine  10 mg Oral Daily   aspirin EC  81 mg Oral Daily   atorvastatin  40 mg Oral Daily   carvedilol  25 mg Oral BID WC   Chlorhexidine Gluconate Cloth  6 each Topical Daily   clopidogrel  75 mg Oral Daily   gentamicin cream  1 Application Topical Daily   heparin  5,000 Units Subcutaneous Q8H   insulin aspart  0-6 Units Subcutaneous TID WC   lidocaine  2 patch Transdermal Q24H   melatonin   5 mg Oral QHS   multivitamin  1 tablet Oral Daily   pantoprazole  40 mg Oral Daily   risperiDONE  0.5 mg Oral QHS   rOPINIRole  1 mg Oral QHS   sertraline  25 mg Oral Daily    Assessment/Plan: # Abdominal pain:  - PD fluid with no WBC or organisms seen, not suggestive of peritonitis - exam nonfocal, mesenteric [place] completed today    # ESRD - Continue PD tonight here.  He appears to have a regimen of 1.5% and 2.5% dextrose - will continue this tonight.   - Follows with [place] [facility] and at [facility]    # HTN - controlled on current regimen    # Heart failure with preserved EF  - compensated  - optimize volume with PD as above    # Anemia of CKD  - Hgb 12 - no indication for ESA at this time    # Hypokalemia - note on daily supplement at home - K+ 3.2, ordered PO Kcl 38mq today   # Metabolic bone disease - Note this patient is on prolia -would discontinue given ESRD and risk for severe hypocalcemia   SAnice Paganini PA-C 04/13/2022, 12:46 PM  [facility] Pager: [phone]

## 2022-04-14 DIAGNOSIS — G589 Mononeuropathy, unspecified: Secondary | ICD-10-CM | POA: Diagnosis not present

## 2022-04-14 DIAGNOSIS — R1084 Generalized abdominal pain: Secondary | ICD-10-CM | POA: Diagnosis not present

## 2022-04-14 LAB — GLUCOSE, CAPILLARY
Glucose-Capillary: 106 mg/dL — ABNORMAL HIGH (ref 70–99)
Glucose-Capillary: 123 mg/dL — ABNORMAL HIGH (ref 70–99)

## 2022-04-14 LAB — CULTURE, BLOOD (ROUTINE X 2)
Culture: NO GROWTH
Culture: NO GROWTH
Special Requests: ADEQUATE

## 2022-04-14 LAB — BASIC METABOLIC PANEL
Anion gap: 10 (ref 5–15)
BUN: 49 mg/dL — ABNORMAL HIGH (ref 8–23)
CO2: 28 mmol/L (ref 22–32)
Calcium: 8.7 mg/dL — ABNORMAL LOW (ref 8.9–10.3)
Chloride: 99 mmol/L (ref 98–111)
Creatinine, Ser: 6.59 mg/dL — ABNORMAL HIGH (ref 0.61–1.24)
GFR, Estimated: 8 mL/min — ABNORMAL LOW (ref >60)
Glucose, Bld: 116 mg/dL — ABNORMAL HIGH (ref 70–99)
Potassium: 3.2 mmol/L — ABNORMAL LOW (ref 3.5–5.1)
Sodium: 137 mmol/L (ref 135–145)

## 2022-04-14 MED ORDER — SENNOSIDES-DOCUSATE SODIUM 8.6-50 MG PO TABS: 2.0000 | ORAL_TABLET | Freq: Every evening | ORAL | 0 refills | Status: AC | PRN

## 2022-04-14 MED ORDER — POTASSIUM CHLORIDE CRYS ER 20 MEQ PO TBCR
40.0000 meq | EXTENDED_RELEASE_TABLET | Freq: Once | ORAL | Status: AC
  Administered 2022-04-14: 40 meq via ORAL
  Filled 2022-04-14: qty 2

## 2022-04-14 MED ORDER — MELATONIN 10 MG PO TABS: 10.0000 mg | ORAL_TABLET | Freq: Every day | ORAL | 0 refills | Status: AC

## 2022-04-14 MED ORDER — GENTAMICIN SULFATE 0.1 % EX CREA: 1.0000 | TOPICAL_CREAM | Freq: Every day | CUTANEOUS | 0 refills | Status: DC

## 2022-04-14 MED ORDER — LIDOCAINE 5 % EX PTCH: 2.0000 | MEDICATED_PATCH | CUTANEOUS | 1 refills | Status: AC

## 2022-04-14 NOTE — TOC Transition Note (Signed)
Transition of Care [facility]) - CM/SW Discharge Note   Patient Details  Name: [name] MRN: [id number] Date of Birth: 09-Feb-1951  [facility]) CM/SW Contact:  Bartholomew Crews, RN Phone Number: [phone] 04/14/2022, 12:59 PM   Clinical Narrative:     Spoke with patient and spouse at the bedside to discuss post acute transition. PTA home with spouse who does his PD. Spouse is currently doing PD for patient nightly. She stated that he is in the process of converting to HD d/t her having surgery on 12/8. Discussed HH PT/OT needs - declined at this time stating that he has been participating in outpatient therapy but he has uses his annual benefit and is hoping to sign back up in January. Spouse to provide transportation home. Patient has all needed DME including hospital bed, RW, BSC, shower seat, 2 wheelchairs, an electric chair, and a ramp. No further TOC needs identified at this time.   Final next level of care: Home/Self Care Barriers to Discharge: No Barriers Identified   Patient Goals and CMS Choice Patient states their goals for this hospitalization and ongoing recovery are:: return home with spouse CMS Medicare.gov Compare Post Acute Care list provided to:: Patient Choice offered to / list presented to : Patient, Spouse  Discharge Placement                       Discharge Plan and Services In-house Referral: NA Discharge Planning Services: CM Consult Post Acute Care Choice: Home Health          DME Arranged: N/A DME Agency: NA       HH Arranged: Patient Refused [place] Agency: NA        Social Determinants of Health (SDOH) Interventions     Readmission Risk Interventions     No data to display

## 2022-04-14 NOTE — Progress Notes (Signed)
PD post treatment note:   PD treatment completed. Patient tolerated treatment well. PD effluent is clear. No specimen collected.  PD exit site clean, dry and intact. Patient is awake, oriented and in no acute distress.  Report given to bedside nurse.   Post treatment VS:   Total UF removed:  823 ml  Post treatment weight: 119.5 kg

## 2022-04-14 NOTE — Discharge Instructions (Signed)
You presented to the ED with abdominal pain and the concern is that for infection as you use peritoneal dialysis.  That concern has been ruled out and you do not have peritonitis which is infection of the abdomen.  We did imaging and multiple tests to rule out other concerning findings for the abdominal pain but all came back negative.  It appears your pain is due to an irritated nerve in your stomach and the lidocaine patches have helped with this.  I recommend you continue using supportive measures of lidocaine patch, Tylenol for the pain management.  I recommend follow-up with your pain clinic doctor as this occurred 2 days after your injection and may be related to that.  He may offer other treatments for this.  Please follow-up with your primary care doctor in 7 to 10 days.  I am placing a referral for home health aide, PT, and OT.  I will leave this to help your recovery.  I have resumed her medications that you were taking prior to your admission.

## 2022-04-14 NOTE — Progress Notes (Signed)
   Subjective:   No overnight events.  States doing well. No acute concerns. States his pain is significantly better today.   Objective:  Vital signs in last 24 hours: Vitals:   04/14/22 0449 04/14/22 0500 04/14/22 0745 04/14/22 0837  BP: 133/62   (!) 115/57  Pulse: 61   71  Resp: 16   18  Temp: 98.3 F (36.8 C)   98.4 F (36.9 C)  TempSrc: Oral   Oral  SpO2: 94%   91%  Weight:  119.1 kg 119.5 kg   Height:       Physical Exam: Constitutional: NAD, sleeping in bed upon arrival HENT: NCAT Cardiovascular: NSR, good pulses  Pulmonary: CTAB Abdominal: slight tender to palpation but improved Neurological: alert and oriented x4 Extremities: Decreased muscle bulk Psych: normal mood  Assessment/Plan:  Principal Problem:   Abdominal pain Active Problems:   ESRD on peritoneal dialysis ([facility])  [name] is a 71 y.o. male with h/o HTN, DM, CAD, atrial flutter, ESRD (on peritoneal dialysis) HLD, anxiety, OSA, COPD, osteomyelitis, Buerger's disease,and  pacemaker placement presented with generalized weakness, abdominal pain and back pain admitted for suspected peritonitis.    #Cutaneous Nerve Entrapment Given his pain worsens with movement and has been helped with lidocaine patch, makes cutaneous nerve entrapment a likely diagnosis. Concerning etiologies ruled out (see prior notes). The pain started 2 days after getting a nerve block. Will continue supportive care and monitor his symptoms. Will need follow up with pain clinic outpatient.  -Continue supportive care with lidocaine patches until follow up with pain clinic outpatient.  -As peritonitis has been ruled out pt is safe to discharge home with continuation of his PD at home. He will be poor candidate for HD given his hx of mesenteric stenosis.     #ESRD on peritoneal dialysis Began PD 1 year ago. 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 Nephrology is comfortable continuing with PD as an outpatient.  -Continue PD at  home.  #Acute hypoxic respiratory failure Resolved. On RA this am. Initially presented w/ SOB. Suspected to be secondary to volume overload in the setting of inability to undergo PD due to his abdominal pain.    -Continue Duonebs q6 hours -Continue incentive spirometry   #Hypokalemia Potassium low at 3.2 this morning. Will replete with oral potassium before discharge. Will defer to nephrology regarding need for chronic repletion given ESRD on PD.   #CAD #Hyperlipidemia Chronic. On Plavix, Aspirin, and atorvastatin.  -Continue above meds.    #HTN Chronic. BP 114/59 this am. Will continue to monitor. Discharge on home meds.    #Type 2 diabetes mellitus with diabetic neuropathy A1c currently at goal at 5.4. Diet controlled at home. CBGs mostly less less than 140 today.     #Anxiety/depression Takes Zoloft 25 mg daily and risperidone 0.5 mg daily at home.  -Discharge on home meds.    #Restless leg syndrome -On ropinirole 1 mg daily.    #Chronic back pain -PT/OT -Lidocaine patch ordered   #Dispo Discharge home today.    Diet: Renal IVF: None VTE: Heparin Code: Full   Idamae Schuller, MD 04/14/2022, 10:38 AM Pager: [phone] After 5pm on weekdays and 1pm on weekends: On Call pager [phone]

## 2022-04-14 NOTE — Progress Notes (Signed)
[name] Progress Note   Subjective:   Had HD overnight, no issues reported. Wearing O2 [place] but O2 is off and sats are > 90%. Denies SOB, CP, dizziness, and nausea. Abdominal pain comes and goes.   Objective Vitals:   04/14/22 0449 04/14/22 0500 04/14/22 0745 04/14/22 0837  BP: 133/62   (!) 115/57  Pulse: 61   71  Resp: 16   18  Temp: 98.3 F (36.8 C)   98.4 F (36.9 C)  TempSrc: Oral   Oral  SpO2: 94%   91%  Weight:  119.1 kg 119.5 kg   Height:       Physical Exam General: Sleeping but awakens to voice. NAD Heart: RRR, no murmur Lungs: CTA bilaterally Abdomen: Non-distended, +BS Extremities:no edema b/l lower extremities Dialysis Access:  PD cath in lower abdomen     Additional Objective Labs: Basic Metabolic Panel: Recent Labs  Lab 04/12/22 0228 04/13/22 0230 04/14/22 0640  NA 136 136 137  K 3.0* 3.2* 3.2*  CL 95* 98 99  CO2 '28 28 28  '$ GLUCOSE 177* 120* 116*  BUN 57* 56* 49*  CREATININE 7.45* 6.98* 6.59*  CALCIUM 9.3 8.8* 8.7*   Liver Function Tests: Recent Labs  Lab 04/09/22 1036 04/10/22 0322  AST 19 22  ALT 20 20  ALKPHOS 75 58  BILITOT 0.6 0.5  PROT 6.8 6.2*  ALBUMIN 3.1* 2.8*   Recent Labs  Lab 04/09/22 1036  LIPASE 34   CBC: Recent Labs  Lab 04/09/22 1036 04/10/22 0322  WBC 10.5 7.6  NEUTROABS 8.4* 4.5  HGB 12.2* 12.0*  HCT 35.6* 35.6*  MCV 104.7* 106.6*  PLT 175 156   Blood Culture    Component Value Date/Time   SDES IN/OUT CATH URINE 04/09/2022 2222   SPECREQUEST  04/09/2022 2222    NONE Performed at [facility], [place] [address], [place], [place] [address]    CULT (A) 04/09/2022 2222    20,000 COLONIES/mL LACTOBACILLUS SPECIES 9,000 COLONIES/mL STAPHYLOCOCCUS LUGDUNENSIS 1,000 COLONIES/mL STAPHYLOCOCCUS EPIDERMIDIS 100 COLONIES/mL ENTEROCOCCUS FAECALIS Standardized susceptibility testing for this organism is not available. LACTOBACILLUS SPECIES    REPTSTATUS 04/13/2022 FINAL 04/09/2022 2222     Cardiac Enzymes: No results for input(s): "CKTOTAL", "CKMB", "CKMBINDEX", "TROPONINI" in the last 168 hours. CBG: Recent Labs  Lab 04/13/22 0824 04/13/22 1148 04/13/22 1720 04/13/22 2000 04/14/22 0840  GLUCAP 120* 117* 122* 167* 106*   Iron Studies: No results for input(s): "IRON", "TIBC", "TRANSFERRIN", "FERRITIN" in the last 72 hours. '@lablastinr3'$ @ Studies/Results: VAS [place] MESENTERIC  Result Date: 04/13/2022 ABDOMINAL VISCERAL Patient Name:  [name]  Date of Exam:   04/13/2022 Medical Rec #: [id number]        Accession #:    [id number] Date of Birth: [birth year]        Patient Gender: M Patient Age:   71 years Exam Location:  [facility] Procedure:      VAS [place] MESENTERIC Referring Phys: [id number] [place] -------------------------------------------------------------------------------- Indications: Abdominal pain, history of calcification at the origin of the              celiac and SMA on previous imaging. Other Factors: History of distal abdominal aortic aneurysm. Limitations: Air/bowel gas and obesity. Comparison Study: 07-30-2019 Prior mesenteric artery duplex concerning for                   proximal SMA stenosis with PSV of 451 cm/s.  08-02-2019 Prior visceral angiography showed SMA calcification                   without flow limiting stenosis                   04-09-2022 Prior CT abdomen/pelvis showed extensive                   calcification of aorta and visceral arteries. Significant                   plaque at origin of celiac and SMA. 3.7 cm fusiform AAA. Performing Technologist: Darlin Coco RDMS, RVT  Examination Guidelines: A complete evaluation includes B-mode imaging, spectral Doppler, color Doppler, and power Doppler as needed of all accessible portions of each vessel. Bilateral testing is considered an integral part of a complete examination. Limited examinations for reoccurring indications may be performed as noted.  Duplex  Findings: +---------------------+-------+--------+------+--------------------------------+ Mesenteric             PSV  EDV cm/sPlaque            Comments                                   cm/s                                                 +---------------------+-------+--------+------+--------------------------------+ Aorta Prox             70                                                  +---------------------+-------+--------+------+--------------------------------+ Aorta Mid              43                                                  +---------------------+-------+--------+------+--------------------------------+ Aorta Distal           36                 AAA noted consistent with recent                                                          CT                +---------------------+-------+--------+------+--------------------------------+ Celiac Artery Origin   268                                                 +---------------------+-------+--------+------+--------------------------------+ Celiac Artery          146  Turbulent             Proximal                                                                   +---------------------+-------+--------+------+--------------------------------+ SMA Origin             339                                                 +---------------------+-------+--------+------+--------------------------------+ SMA Proximal           281                                                 +---------------------+-------+--------+------+--------------------------------+ SMA Mid                171                                                 +---------------------+-------+--------+------+--------------------------------+ SMA Distal             155                                                  +---------------------+-------+--------+------+--------------------------------+ CHA                    238                           Turbulent             +---------------------+-------+--------+------+--------------------------------+ Splenic                155                           Turbulent             +---------------------+-------+--------+------+--------------------------------+    Summary: Mesenteric:  Flow velocities suggestive of >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 However, today's examination was limited due to habitus and bowel gas. Flow velocities in the SMA appear decreased as compared to previous examination. Flow velocities in the celiac artery appear increased as compared to previous examination. The inferior mesenteric artery is not visualized on today's examination.  *See table(s) above for measurements and observations.  Diagnosing physician: Harold Barban MD  Electronically signed by Harold Barban MD on 04/13/2022 at 8:42:48 PM.    Final    Medications:  dialysis solution 1.5% low-MG/low-CA     dialysis solution 2.5% low-MG/low-CA      amLODipine  10 mg Oral Daily   aspirin EC  81 mg Oral Daily   atorvastatin  40 mg Oral Daily   carvedilol  25 mg Oral BID WC   Chlorhexidine Gluconate Cloth  6 each Topical Daily   clopidogrel  75 mg Oral Daily   gentamicin cream  1 Application Topical Daily   heparin  5,000 Units Subcutaneous Q8H   insulin aspart  0-6 Units Subcutaneous TID WC   lidocaine  2 patch Transdermal Q24H   melatonin  5 mg Oral QHS   multivitamin  1 tablet Oral Daily   pantoprazole  40 mg Oral Daily   risperiDONE  0.5 mg Oral QHS   rOPINIRole  1 mg Oral QHS   sertraline  25 mg Oral Daily   Assessment/Plan: # Abdominal pain:  - PD fluid with no WBC or organisms seen, not suggestive of peritonitis - exam nonfocal, mesenteric [place]  completed  -further work up per primary team   # ESRD - Continue PD tonight here.  He appears to have a regimen of 1.5% and 2.5% dextrose - will continue this tonight.   - Follows with [facility] and at [facility]    # HTN - controlled on current regimen    # Heart failure with preserved EF  - compensated  - optimize volume with PD as above    # Anemia of CKD  - Hgb 12 - no indication for ESA at this time    # Hypokalemia - note on supplement at home - K+ 3.2, ordered PO Kcl 25mq again today   # Metabolic bone disease - Note this patient is on prolia -would discontinue given ESRD and risk for severe hypocalcemia   # Dispo -Noted he is possibly going to SNF. If patient discharges to SNF he will need to be converted to hemodialysis and clipped to an HD center before discharge. SNFs cannot accommodate PD.     SAnice Paganini PA-C 04/14/2022, 9:38 AM  [facility] Pager: [phone]

## 2022-04-14 NOTE — Progress Notes (Signed)
AVS reviewed and pt  verbalized understanding of all teaching and understands instructions.

## 2022-04-15 NOTE — Discharge Summary (Signed)
Name: [name] MRN: [id number] DOB: [birth year] 71 y.o. 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 ESRD on PD HLD c/b CAD HTN T2DM Anxiety  Major Depressive Disorder Restless Leg Syndrome   Discharge Medications: Allergies as of 04/14/2022       Reactions   Hydrocodone Itching   Hydrocodone-acetaminophen Itching   Other    Opioids cause hallucinations and delusions has to be given risperidone to settle down Other reaction(s): hallucinations/delirium Opiates (Uncoded) Reaction:  Serotonin Syndrome   Oxycodone-acetaminophen Other (See Comments)        Medication List     STOP taking these medications    CALCIFEROL PO   magnesium oxide 400 MG tablet Commonly known as: MAG-OX       TAKE these medications    acetaminophen 325 MG tablet Commonly known as: TYLENOL Take 650 mg by mouth every 6 (six) hours as needed for mild pain or moderate pain.   albuterol 108 (90 Base) MCG/ACT inhaler Commonly known as: VENTOLIN HFA Inhale 2 puffs into the lungs every 6 (six) hours as needed for wheezing or shortness of breath.   amLODipine 10 MG tablet Commonly known as: NORVASC Take 1 tablet (10 mg total) by mouth daily.   aspirin EC 81 MG tablet Take 1 tablet (81 mg total) by mouth daily.   atorvastatin 40 MG tablet Commonly known as: LIPITOR Take 1 tablet by mouth once daily   beclomethasone 80 MCG/ACT inhaler Commonly known as: QVAR Inhale 2 puffs into the lungs daily as needed (wheezing and shortness of breath).   buPROPion 150 MG 24 hr tablet Commonly known as: WELLBUTRIN XL Take 150 mg by mouth daily.   CALCIUM CITRATE PO Take 600 mg by mouth in the morning and at bedtime.    carvedilol 25 MG tablet Commonly known as: COREG Take 1 tablet (25 mg total) by mouth 2 (two) times daily with a meal.   clobetasol 0.05 % external solution Commonly known as: TEMOVATE Apply 1 application topically 2 (two) times daily as needed (itching).   denosumab 60 MG/ML Sosy injection Commonly known as: PROLIA Inject 60 mg into the skin every 6 (six) months.   gentamicin cream 0.1 % Commonly known as: GARAMYCIN Apply 1 Application topically daily.   lidocaine 5 % Commonly known as: LIDODERM Place 2 patches onto the skin daily. Apply to painful areas including back and abdomen. Remove & Discard patch within 12 hours or as directed by MD.   Melatonin 10 MG Tabs Take 10 mg by mouth at bedtime. What changed: how much to take   multivitamin Tabs tablet Take 1 tablet by mouth daily.   nitroGLYCERIN 0.4 MG SL tablet Commonly known as: NITROSTAT Place 1 tablet (0.4 mg total) under the tongue every 5 (five) minutes as needed for chest pain.   pantoprazole 40 MG tablet Commonly known as: Protonix Take 1 tablet (40 mg total) by mouth daily.   Plavix 75 MG tablet Generic drug: clopidogrel Take 75 mg by mouth daily.   polyethylene glycol 17 g packet Commonly known as: MIRALAX / GLYCOLAX Take 17 g by mouth daily as needed for mild constipation or moderate constipation.   potassium chloride SA 20 MEQ tablet Commonly known as: Rhetta Mura  Take 20 mEq by mouth every other day.   risperiDONE 0.5 MG tablet Commonly known as: RISPERDAL Take 0.5 mg by mouth at bedtime.   rOPINIRole 1 MG tablet Commonly known as: REQUIP Take 1 mg by mouth daily.   senna-docusate 8.6-50 MG tablet Commonly known as: Senokot-S Take 2 tablets by mouth at bedtime as needed for mild constipation or moderate constipation. What changed: reasons to take this   sertraline 25 MG tablet Commonly known as: ZOLOFT Take 25 mg by mouth in the morning.   traMADol 50 MG tablet Commonly known as:  ULTRAM Take 50 mg by mouth every 4 (four) hours as needed for moderate pain.   Vitamin D3 50 MCG (2000 UT) Tabs Take 2,000 Units by mouth daily.               Discharge Care Instructions  (From admission, onward)           Start     Ordered   04/14/22 0000  Discharge wound care:       Comments: Clean skin near exit site with chloraprep swab sticks.  Starting at catheter, use circular pattern around exit site, moving towards outer edges of area covered by dressing.  Apply gentamicin cream to site once daily.  Cover with dry dressing.   04/14/22 1128            Disposition and follow-up:   [name] was discharged from [facility] in Stable condition.  At the hospital follow up visit please address:  1.  Resolution of abdominal pain, and control of back pain. Pt needs to see pain clinic.   2.  Labs / imaging needed at time of follow-up: BMP to check K.   3.  Pending labs/ test needing follow-up: None  Follow-up Appointments:  Follow-up Information     Pradhan, Tilden Fossa, MD. Call in 1 day(s).   Specialty: Internal Medicine Why: Call you PCP to make an appointment in 7-10 days for a hospital follow up. Contact information: [place] [address] [place] [place] [facility] Course by problem list: HPI by Dr. Jeanett Schlein "Patient is a 71 year old male with PMHx of  HTN, DM, CAD, atrial flutter, ESRD (on peritoneal dialysis) HLD, anxiety, OSA, COPD, osteomyelitis, Buerger's disease,and  pacemaker placement.    Pt presented with abdominal pain, back pain, generalized weakness, fatigue and SOB for 2 days. He reports started to experience abdominal pain, sharp in nature, non radiating, gradually got worse. Denies fever, N/V/D but chills. Pt endorses constipation but has bowel movements every 2 days at baseline. Has some headache and dizziness but denies neck pain, photophobia or change in vision. He also endorse SOB  which is worse with physical activity. Denies chest pain, palpitations, diaphoresis, PND or orthopnea.   He reports he had a back injury about a year ago after the fall while coming out of the dialysis center. He see pain management for his back pain. He underwent nerve block injections in his lower back last week Tuesday. It resolved back pain for about a day. Pain has since recurred. Denies radiation of back pain to legs. Denies changes in urinary and bowel habits   He started peritoneal dialysis one year ago for ESRD secondary to diabetes. His abdominal pain gradually got worse and he was not able to perform dialysis last night. For worsening abdominal pain he presented  to [facility] ED for evaluation. Concerns for peritonitis from his peritoneal dialysis pt were transferred to [facility] for further evaluation."   #Cutaneous Nerve Entrapment  Initially presented w/ 2 days of abdominal pain that started after getting his epidural injection. Patient had leukocytosis and elevated creatinine on admission. Concerning etiologies ruled out. With normal lipase and lactic acid, low suspicion for pancreatitis or chronic mesenteric ischemia at this time. Peritoneal fluid analysis WNL. Low suspicion for peritonitis at this time. Blood cultures negative. CT non-specific. KUB demonstrates non-obstructive bowel gas pattern. Low suspicion for constipation as the cause of his abdominal pain at this time. Low suspicion for peptic ulcer disease at this time given lack of NSAID use at home.  Patient has hx of cholecystectomy and appendectomy ruling out these etiologies. 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 The pain  started 2 days after getting a nerve block. Will continue supportive care and monitor his symptoms. Will need follow up with pain clinic outpatient.     #ESRD on peritoneal dialysis Patient was on HD and began PD 1 year ago. 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 Nephrology is comfortable continuing with PD as an outpatient. Poor candidate for HD given mesenteric ischemia.   #Acute hypoxic respiratory failure Resolved. On RA this am. Initially presented w/ SOB. Suspected to be secondary to volume overload in the setting of inability to undergo PD due to his abdominal pain.      #Hypokalemia Repleted gently given his ESRD on PD.    #CAD #Hyperlipidemia Chronic. On Plavix, Aspirin, and atorvastatin. Meds were continued throughout the hospitalizations.     #HTN Chronic. BP 114/59 this am. Home meds were initially held but restarted during hospitalization and on discharge.    #Type 2 diabetes mellitus with diabetic neuropathy A1c at goal at 5.4. Diet controlled at home. SSI with very sensitive scale was used.      #Anxiety/depression Takes Zoloft 25 mg daily and risperidone 0.5 mg daily at home. Meds were continued and pt was discharged on home meds.    #Restless leg syndrome Chronic. Home med ropinirole 1 mg daily was continued.    #Chronic back pain Chronic. Conservative measures were used. Pt needs to follow with pain clinic as he stated their was plan for intrathecal pump.    Discharge Subjective: States doing well. No acute concerns. States his pain is significantly better today.    Discharge Exam:   BP (!) 115/57 (BP Location: Right Wrist)   Pulse 71   Temp 98.4 F (36.9 C) (Oral)   Resp 18   Ht '6\' 4"'$  (1.93 m)   Wt 119.5 kg   SpO2 91%   BMI 32.07 kg/m  Physical Exam:  Constitutional: NAD, sleeping in bed upon arrival HENT: NCAT Cardiovascular: NSR, good pulses  Pulmonary: CTAB Abdominal: slight tender to palpation but improved Neurological: alert  and oriented x4 Extremities: Decreased muscle bulk Psych: normal mood   Pertinent Labs, Studies, and Procedures:     Latest Ref Rng & Units 04/10/2022    3:22 AM 04/09/2022   10:36 AM 10/11/2021    1:44 AM  CBC  WBC 4.0 - 10.5 K/uL 7.6  10.5  10.5  Hemoglobin 13.0 - 17.0 g/dL 12.0  12.2  12.1   Hematocrit 39.0 - 52.0 % 35.6  35.6  35.0   Platelets 150 - 400 K/uL 156  175  163        Latest Ref Rng & Units 04/14/2022    6:40 AM 04/13/2022    2:30 AM 04/12/2022    2:28 AM  BMP  Glucose 70 - 99 mg/dL 116  120  177   BUN 8 - 23 mg/dL 49  56  57   Creatinine 0.61 - 1.24 mg/dL 6.59  6.98  7.45   Sodium 135 - 145 mmol/L 137  136  136   Potassium 3.5 - 5.1 mmol/L 3.2  3.2  3.0   Chloride 98 - 111 mmol/L 99  98  95   CO2 22 - 32 mmol/L '28  28  28   '$ Calcium 8.9 - 10.3 mg/dL 8.7  8.8  9.3     CT Head Wo Contrast  Result Date: 04/09/2022 CLINICAL DATA:  Provided history: Mental status change, unknown cause. EXAM: CT HEAD WITHOUT CONTRAST  IMPRESSION: 1. No evidence of acute intracranial abnormality. 2. Parenchymal atrophy and chronic small vessel ischemic disease, as described. 3. Paranasal sinus disease at the imaged levels, as described. Electronically Signed   By: Kellie Simmering D.O.   On: 04/09/2022 15:05   CT ABDOMEN PELVIS WO CONTRAST  Result Date: 04/09/2022 CLINICAL DATA:  Abdominal pain, nonlocalized, increased weakness and back pain, history of abdominal aortic aneurysm, diverticulosis, asthma, atrial flutter, coronary artery disease post MI, COPD, chronic kidney disease, type II diabetes mellitus, hypertension EXAM: CT ABDOMEN AND PELVIS WITHOUT CONTRAST TECHNIQUE: IMPRESSION: Peritoneal dialysis catheter with ascites/dialysate. BILATERAL renal cortical thinning with tiny nonobstructing BILATERAL renal calculi. Stable 18 x 15 mm exophytic nodule upper pole LEFT kidney, 22 x 15 mm. Stable aneurysmal dilatation of distal abdominal aorta 3.7 x 3.7 cm; recommend follow-up every 2  years. Reference: J Am Coll Radiol 1191;47:829-562. Extensive atherosclerotic disease changes including coronary arteries and origins of visceral arteries. No acute intra-abdominal or intrapelvic abnormalities. Aortic Atherosclerosis (ICD10-I70.0). Aortic aneurysm NOS (ICD10-I71.9). Electronically Signed   By: Lavonia Dana M.D.   On: 04/09/2022 13:31   DG Chest Port 1 View  Result Date: 04/09/2022 CLINICAL DATA:  Possible sepsis.  EXAM: PORTABLE CHEST 1 VIEW COMPARISON:   IMPRESSION: Mild bibasilar subsegmental atelectasis or scarring. Electronically Signed   By: Marijo Conception M.D.   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 However, today's examination was limited due to habitus and bowel gas. Flow velocities in the SMA appear decreased as compared to previous examination. Flow velocities in the celiac artery appear increased as compared to previous examination. The inferior mesenteric artery is not visualized on today's examination.     Discharge Instructions: Discharge Instructions     Call MD for:  difficulty breathing, headache or visual disturbances   Complete by: As directed    Call MD for:  hives   Complete by: As directed    Call MD for:  persistant dizziness or light-headedness   Complete by: As directed    Call MD for:  persistant nausea and vomiting   Complete by: As directed    Call MD for:  redness, tenderness, or signs of infection (pain, swelling, redness, odor or  green/yellow discharge around incision site)   Complete by: As directed    Call MD for:  severe uncontrolled pain   Complete by: As directed    Call MD for:  temperature >100.4   Complete by: As directed    Diet - low sodium heart healthy   Complete by: As directed    Discharge wound care:   Complete by: As directed     Clean skin near exit site with chloraprep swab sticks.  Starting at catheter, use circular pattern around exit site, moving towards outer edges of area covered by dressing.  Apply gentamicin cream to site once daily.  Cover with dry dressing.   Increase activity slowly   Complete by: As directed       You presented to the ED with abdominal pain and the concern is that for infection as you use peritoneal dialysis.  That concern has been ruled out and you do not have peritonitis which is infection of the abdomen.  We did imaging and multiple tests to rule out other concerning findings for the abdominal pain but all came back negative.  It appears your pain is due to an irritated nerve in your stomach and the lidocaine patches have helped with this.  I recommend you continue using supportive measures of lidocaine patch, Tylenol for the pain management.  I recommend follow-up with your pain clinic doctor as this occurred 2 days after your injection and may be related to that.  He may offer other treatments for this.  Please follow-up with your primary care doctor in 7 to 10 days.  I am placing a referral for home health aide, PT, and OT.  I will leave this to help your recovery.  I have resumed her medications that you were taking prior to your admission.  Signed: Idamae Schuller, MD Tillie Rung. [facility] Internal Medicine Residency, PGY-2  04/15/2022, 4:01 PM   Pager: [phone]

## 2022-04-19 ENCOUNTER — Other Ambulatory Visit: Payer: Self-pay | Admitting: Cardiology

## 2022-05-06 ENCOUNTER — Ambulatory Visit: Payer: Medicare Other | Attending: Cardiology | Admitting: Cardiology

## 2022-05-06 ENCOUNTER — Encounter: Payer: Self-pay | Admitting: *Deleted

## 2022-05-06 ENCOUNTER — Encounter: Payer: Self-pay | Admitting: Cardiology

## 2022-05-06 VITALS — BP 134/60 | HR 56 | Ht 76.0 in

## 2022-05-06 DIAGNOSIS — I2510 Atherosclerotic heart disease of native coronary artery without angina pectoris: Secondary | ICD-10-CM | POA: Insufficient documentation

## 2022-05-06 DIAGNOSIS — I480 Paroxysmal atrial fibrillation: Secondary | ICD-10-CM | POA: Diagnosis not present

## 2022-05-06 DIAGNOSIS — I10 Essential (primary) hypertension: Secondary | ICD-10-CM | POA: Insufficient documentation

## 2022-05-06 DIAGNOSIS — I6523 Occlusion and stenosis of bilateral carotid arteries: Secondary | ICD-10-CM

## 2022-05-06 NOTE — Patient Instructions (Signed)
Medication Instructions:  Continue all current medications.   Labwork: none  Testing/Procedures: none  Follow-Up: 6 months   Any Other Special Instructions Will Be Listed Below (If Applicable).   If you need a refill on your cardiac medications before your next appointment, please call your pharmacy.  

## 2022-05-06 NOTE — Progress Notes (Signed)
Clinical Summary [name] is a 71 y.o.male seen today for follow up of the following medical problems.      1. Aflutter/Afib - low afib burden by pacemaker check   - no recent palpitations. No bleeding on eliquis.     - eliquis stopped 10/2018 due to anemia by Dr Rayann Heman   - issues with GI bleeding 10/2018, heme +stools, iron deficiency. Colonscopy with nonbleeding angiodysplastic lesion, small polyps. EGD polyp no signs of bleeding.       - previosuyl discussed watchman device with Dr Rayann Heman  - 11/2020 evaluated by Dr Quentin Ore, thought poor watchman candidate due to chronic medical conditions and ESRD     - no recent palpitations      2. Symptomatic bradycardia - s/p pacemaker placement following episode of syncope - admit 09/28/17 to [place], [place] hospital with dizziness and syncope. - followed by EP. Chronically elevated RV lead threshold but stable    02/2022 device check check was normal - no recent symptoms.       3. OSA screen - very mild OSA by 11/2017 study,did have sleep hypoxemia     4. Carotid stenosis - noted during recent admission for syncope.  -09/2017 carotid US in [place] with  RICA <09%, LICA 47-09%   10/2834 carotid US: LICA 62-94%, RICA plaque without stenosis. (PA Lenze referred to vascular) 08/2020 carotid US [place]: 1-49% bilateral ICA disease.    5. HTN - he is compliant with meds - prior issues with low bp's on HD, no recent issues now on peritoneal HD   - home bp's usually 120s/70s - nephrologist lowered norvasc due to dizziness.    6. CAD   - prior stent to RCA in 1999. cath 05/2001 : LM 20-30%, LAD 50-60% And 60-70% mid, LCX with mid AV portion 60-70%, RCA with patent stent with proximal 40-50% disease. LVEF by LVgram 65%, LVEDP 20. Overall moderate lesions, not great anatomically for PCI per notes, treated medically.   - 10/2014 Lexiscan with small area of ischemia mid inferior to apical, overall low risk  - Jan 2019 nuclear stress  no ischemia    - admit 07/2019 with NSTEMI (peak trop 229), cath as reported below. Overall complex anatomy difficult to pursue with PCI, recs for medical management.  He only develops symptoms postdialysis when his blood pressure is low.   - imdur lowered to '30mg'$  due to headaches.  - started on ranexa '500mg'$  bid by PA Lenze at 3/22 visit - he reports HD has increased his dry weight with some improvement.  - pain diffuse midabdomen, typically grabbing like pain. +SOB, +hot. Can last 10 minutes up to 2 hours.  - some mild chest discomfort with exertion.    - insurance would not cover ranexa     10/2020 nuclear stress: large inferior infarct, mild to mod apical ischemia 10/2020 echo LVEF 60-65%, grade I dd   -admit 09/2021 with NSTEMI - 09/2021 cath: distal LM 25%, mid LAD 40%, D1 80%, D2 100%, D3 90%, ostial LCX occluded, RCA prox 35% and distal 50%. Could not cross LCX or diag with wire. Recs for medical therapy - 09/2021 echo: LVEF 55-60% - no recurrent chest pains. - compliant wit meds - limited rehab ability given chronic back pain.   - no chest pains.          7. Chronic diastolic HF -76/5465  echo LVEF 60-65%, grade II diastoilc dysfunction -11/2018 echo LVEF 60-65%, grade I dd, normal RV   -  some recent weight gain calorie related, no LE edema, SOB, or DOE   - fluid managed with HD    8. ESRD - followed by renal  - on peritoneal HD, just finished training. Tolerating peritoneal HD - no recurrent chest pain.  - home bp's 120s/70-80s on peritoneal dialysis.          6. Hyperlipidemia   - compliant with statin   07/2019 TC 93 TG 95 HDL 38 LDL 36 09/2020 TC 63 TG 65 HDL 35 LDL 16     7. COPD - followed by pcp     8. AAA 06/2016 CT A/P 3.4 cm infrerenal AAA - 07/2019 mesenteric [place] AAA 3.3 x 3.6 cm 07/2019 CT A/P 3.7 AAA, rece [place] 2 years   09/2020 Advanced aortic and Sharnice Bosler atherosclerosis. Infrarenal aortic aneurysm with maximal dimension 3.7 cm - repeat study 2024    03/2022 CT A/P: 3.7 x 3.7 cm aneurysm stable.      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  - followed by vascular   10 . Chronic back pain - 12/2020 had back stimulator implant - has had some improvement since procedure.  Past Medical History:  Diagnosis Date   AAA (abdominal aortic aneurysm) ([place]) 06/2016   Mild increase in size of 3.4 cm infrarenal abdominal aortic aneurysm   Anemia    Aortic atherosclerosis (HCC)    Arthritis    Asthma    Atrial flutter (HCC)    AVF (arteriovenous fistula) (HCC)    Left forearm   CAD (coronary artery disease)    a. s/p prior stenting of RCA in 1999 b. cath in 2003 showing moderate CAD c. NST in 2016 showing small area of ischemic and low-risk   CHF (congestive heart failure) ([place])    CKD (chronic kidney disease), stage V ([place])    kidney transplant evaluation   COPD (chronic obstructive pulmonary disease) ([place])    with ongoing tobacco use and patient failed sham takes   Diverticulosis 2018   Mild sigmoid colon diverticulosis.   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    Reflux esophagitis    hx   Two-vessel coronary artery disease    moderate. by cath in 2003. Status post stenting of the mdi RCA November 1999 normal left ventricular ejection fraction   Wears glasses    Wears hearing aid    B/L     Allergies  Allergen Reactions   Hydrocodone Itching   Hydrocodone-Acetaminophen Itching   Other      Opioids cause hallucinations and delusions has to be given risperidone to settle down Other reaction(s): hallucinations/delirium  Opiates (Uncoded) Reaction:  Serotonin Syndrome     Oxycodone-Acetaminophen Other (See Comments)     Current Outpatient Medications  Medication Sig Dispense Refill   acetaminophen (TYLENOL) 325 MG tablet Take 650 mg by mouth every 6 (  six) hours as needed for mild pain or moderate pain.     albuterol (VENTOLIN HFA) 108 (90 Base) MCG/ACT inhaler Inhale 2 puffs into the lungs every 6 (six) hours as needed for wheezing or shortness of breath.      amLODipine (NORVASC) 10 MG tablet Take 1 tablet by mouth once daily 90 tablet 1   aspirin EC 81 MG EC tablet Take 1 tablet (81 mg total) by mouth daily.     atorvastatin (LIPITOR) 40 MG tablet Take 1 tablet by mouth once daily 90 tablet 0   beclomethasone (QVAR) 80 MCG/ACT inhaler Inhale 2 puffs into the lungs daily as needed (wheezing and shortness of breath).     buPROPion (WELLBUTRIN XL) 150 MG 24 hr tablet Take 150 mg by mouth daily. (Patient not taking: Reported on 04/10/2022)     CALCIUM CITRATE PO Take 600 mg by mouth in the morning and at bedtime.     carvedilol (COREG) 25 MG tablet Take 1 tablet (25 mg total) by mouth 2 (two) times daily with a meal. 180 tablet 3   Cholecalciferol (VITAMIN D3) 2000 units TABS Take 2,000 Units by mouth daily.      clobetasol (TEMOVATE) 0.05 % external solution Apply 1 application topically 2 (two) times daily as needed (itching).     denosumab (PROLIA) 60 MG/ML SOSY injection Inject 60 mg into the skin every 6 (six) months.     gentamicin cream (GARAMYCIN) 0.1 % Apply 1 Application topically daily. 15 g 0   lidocaine (LIDODERM) 5 % Place 2 patches onto the skin daily. Apply to painful areas including back and abdomen. Remove & Discard patch within 12 hours or as directed by MD. 30 patch 1   Melatonin 10 MG TABS Take 10 mg by mouth at bedtime. 30 tablet 0   multivitamin (RENA-VIT)  TABS tablet Take 1 tablet by mouth daily.     nitroGLYCERIN (NITROSTAT) 0.4 MG SL tablet Place 1 tablet (0.4 mg total) under the tongue every 5 (five) minutes as needed for chest pain. 30 tablet 6   pantoprazole (PROTONIX) 40 MG tablet Take 1 tablet (40 mg total) by mouth daily. 90 tablet 0   PLAVIX 75 MG tablet Take 75 mg by mouth daily.     polyethylene glycol (MIRALAX / GLYCOLAX) 17 g packet Take 17 g by mouth daily as needed for mild constipation or moderate constipation.     potassium chloride SA (KLOR-CON M) 20 MEQ tablet Take 20 mEq by mouth every other day.     risperiDONE (RISPERDAL) 0.5 MG tablet Take 0.5 mg by mouth at bedtime.     rOPINIRole (REQUIP) 1 MG tablet Take 1 mg by mouth daily.     senna-docusate (SENOKOT-S) 8.6-50 MG tablet Take 2 tablets by mouth at bedtime as needed for mild constipation or moderate constipation. 30 tablet 0   sertraline (ZOLOFT) 25 MG tablet Take 25 mg by mouth in the morning.     traMADol (ULTRAM) 50 MG tablet Take 50 mg by mouth every 4 (four) hours as needed for moderate pain. (Patient not taking: Reported on 04/10/2022)     No current facility-administered medications for this visit.     Past Surgical History:  Procedure Laterality Date   A/V FISTULAGRAM Left 04/30/2019   Procedure: A/V FISTULAGRAM;  Surgeon: Elam Dutch, MD;  Location: [facility];  Service: Cardiovascular;  Laterality: Left;   A/V FISTULAGRAM Left 09/22/2019   Procedure: A/V FISTULAGRAM;  Surgeon: Monica Martinez  J, MD;  Location: [facility];  Service: Cardiovascular;  Laterality: Left;   ABDOMINAL SURGERY     APPENDECTOMY     AV FISTULA PLACEMENT Right 03/11/2018   Procedure: CREATION Brachiocephalic Fistula RIGHT ARM;  Surgeon: Rosetta Posner, MD;  Location: [facility];  Service: Vascular;  Laterality: Right;   AV FISTULA PLACEMENT Left 05/06/2019   Procedure: LEFT BRACHIOCEPHALIC ARTERIOVENOUS (AV) FISTULA CREATION;  Surgeon: Marty Heck, MD;   Location: [facility];  Service: Vascular;  Laterality: Left;   BACK SURGERY     x3; cages in patient's back since 2000   BIOPSY  11/12/2018   Procedure: BIOPSY;  Surgeon: Laurence Spates, MD;  Location: [facility];  Service: Endoscopy;;   CARDIAC CATHETERIZATION     stent   CHOLECYSTECTOMY     CLOSED REDUCTION SHOULDER DISLOCATION     COLONOSCOPY W/ BIOPSIES AND POLYPECTOMY     COLONOSCOPY WITH PROPOFOL N/A 11/12/2018   Procedure: COLONOSCOPY WITH PROPOFOL;  Surgeon: Laurence Spates, MD;  [facility];  Service: Endoscopy;  Laterality: N/A;   COLONOSCOPY WITH PROPOFOL N/A 10/02/2021   Procedure: COLONOSCOPY WITH PROPOFOL;  Surgeon: Clarene Essex, MD;  [facility];  Service: Gastroenterology;  Laterality: N/A;   CORONARY ANGIOPLASTY     CYSTOSCOPY WITH INSERTION OF UROLIFT N/A 11/06/2020   Procedure: CYSTOSCOPY WITH INSERTION OF UROLIFT;  Surgeon: Cleon Gustin, MD;  Location: [facility];  Service: Urology;  Laterality: N/A;   diabetic ulcers     DIAGNOSTIC LAPAROSCOPY     DIALYSIS/PERMA CATHETER REMOVAL N/A 09/22/2019   Procedure: DIALYSIS/PERMA CATHETER REMOVAL;  Surgeon: Marty Heck, MD;  Location: [facility];  Service: Cardiovascular;  Laterality: N/A;   ESOPHAGOGASTRODUODENOSCOPY (EGD) WITH PROPOFOL N/A 11/12/2018   Procedure: ESOPHAGOGASTRODUODENOSCOPY (EGD) WITH PROPOFOL;  Surgeon: Laurence Spates, MD;  [facility];  Service: Endoscopy;  Laterality: N/A;   GROIN EXPLORATION     HEMOSTASIS CLIP PLACEMENT  11/12/2018   Procedure: HEMOSTASIS CLIP PLACEMENT;  Surgeon: Laurence Spates, MD;  [facility];  Service: Endoscopy;;   HIP ARTHROPLASTY Left    gamma nail   INSERT / REPLACE / [place] (AV) ARTEGRAFT ARM Left 03/18/2018   Procedure: INSERTION OF ARTERIOVENOUS (AV) FISTULA;  Surgeon: Rosetta Posner, MD;  Location: [facility];  Service: Vascular;  Laterality: Left;   IR FLUORO GUIDE CV LINE RIGHT   04/11/2019   IR US GUIDE VASC ACCESS RIGHT  04/11/2019   LEFT HEART CATH AND CORONARY ANGIOGRAPHY N/A 07/23/2019   Procedure: LEFT HEART CATH AND CORONARY ANGIOGRAPHY;  Surgeon: Martinique, Peter M, MD;  Location: [facility];  Service: Cardiovascular;  Laterality: N/A;   LEFT HEART CATH AND CORONARY ANGIOGRAPHY N/A 10/10/2021   Procedure: LEFT HEART CATH AND CORONARY ANGIOGRAPHY;  Surgeon: [name], MD;  Location: [facility];  Service: Cardiovascular;  Laterality: N/A;   LIGATION ARTERIOVENOUS GORTEX GRAFT Right 03/18/2018   Procedure: LIGATION ARTERIOVENOUS FISTULA;  Surgeon: Rosetta Posner, MD;  [facility];  Service: Vascular;  Laterality: Right;   OSTECTOMY Left 04/23/2017   Procedure: OSTECTOMY LEFT GREAT TOE;  Surgeon: [name], DPM;  Location: [facility];  Service: Podiatry;  Laterality: Left;  left great toe   POLYPECTOMY  11/12/2018   Procedure: POLYPECTOMY;  Surgeon: Laurence Spates, MD;  [facility];  Service: Endoscopy;;   POLYPECTOMY  10/02/2021   Procedure: POLYPECTOMY;  Surgeon: Clarene Essex, MD;  [facility];  Service: Gastroenterology;;   SPINAL CORD STIMULATOR INSERTION N/A 12/20/2020   Procedure: Permanent Spinal Cord Stimulator  Lead Implant with Fluoroscopy and Battery Implant;  Surgeon: Reece Agar, MD;  Location: [facility];  Service: Neurosurgery;  Laterality: N/A;   TUMOR REMOVAL     small intestine x3   VISCERAL ANGIOGRAPHY N/A 08/02/2019   Procedure: VISCERAL ANGIOGRAPHY;  Surgeon: Waynetta Sandy, MD;  Location: [facility];  Service: Cardiovascular;  Laterality: N/A;     Allergies  Allergen Reactions   Hydrocodone Itching   Hydrocodone-Acetaminophen Itching   Other     Opioids cause hallucinations and delusions has to be given risperidone to settle down Other reaction(s): hallucinations/delirium  Opiates (Uncoded) Reaction:  Serotonin Syndrome     Oxycodone-Acetaminophen Other (See Comments)       Family History  Problem Relation Age of Onset   Diabetes Mother    Alcohol abuse Father      Social History Mr. Ruacho reports that he quit smoking about 2 years ago. His smoking use included cigarettes. He started smoking about 53 years ago. He has never used smokeless tobacco. Mr. Bogan reports that he does not currently use alcohol.   Review of Systems CONSTITUTIONAL: No weight loss, fever, chills, weakness or fatigue.  HEENT: Eyes: No visual loss, blurred vision, double vision or yellow sclerae.No hearing loss, sneezing, congestion, runny nose or sore throat.  SKIN: No rash or itching.  CARDIOVASCULAR: per hpi RESPIRATORY: No shortness of breath, cough or sputum.  GASTROINTESTINAL: No anorexia, nausea, vomiting or diarrhea. No abdominal pain or blood.  GENITOURINARY: No burning on urination, no polyuria NEUROLOGICAL: No headache, dizziness, syncope, paralysis, ataxia, numbness or tingling in the extremities. No change in bowel or bladder control.  MUSCULOSKELETAL: No muscle, back pain, joint pain or stiffness.  LYMPHATICS: No enlarged nodes. No history of splenectomy.  PSYCHIATRIC: No history of depression or anxiety.  ENDOCRINOLOGIC: No reports of sweating, cold or heat intolerance. No polyuria or polydipsia.  Marland Kitchen   Physical Examination Today's Vitals   05/06/22 1425  BP: 134/60  Pulse: (!) 56  SpO2: 97%  Height: '6\' 4"'$  (1.93 m)   Body mass index is 32.07 kg/m.  Gen: resting comfortably, no acute distress HEENT: no scleral icterus, pupils equal round and reactive, no palptable cervical adenopathy,  CV: RRR, no m/r/g no jvd Resp: Clear to auscultation bilaterally GI: abdomen is soft, non-tender, non-distended, normal bowel sounds, no hepatosplenomegaly MSK: extremities are warm, no edema.  Skin: warm, no rash Neuro:  no focal deficits Psych: appropriate affect   Diagnostic Studies  05/2001 Cath   FINDINGS:   1. Left main trunk: Medium caliber vessel.  This is a long vessel with a   distal taper of 20-30%.   2. Left anterior descending artery: This is a medium caliber vessel that   provides a large bifurcating first diagonal Tanuj Mullens in the proximal segment   and before then extending to the apex, the LAD tapers significantly after   the diagonal Tsuruko Murtha. There is moderate disease of 50-60% after the   diagonal Yalitza Teed and then a focal eccentric narrowing of 60-70% in the mid   section of the LAD. The diagonal Jennfer Gassen has moderate disease of 30-40% in   the proximal segment. The lateral division is a small caliber vessel with   an ostial narrowing of 60%.   3. Left circumflex artery: This is a small caliber vessel that provides a   trivial first  marginal Zahria Ding in the mid section and a medium caliber   second marginal Bijal Siglin distally. The mid AV circumflex at the takeoff of   the first and second diagonal branches had moderate diffuse disease of   60-70%.   4. Right coronary artery: Dominant. This is a large caliber vessel that   provides a posterior descending artery and two posteroventricular branches   in the terminal segment. The right coronary artery has evidence of an   existing stent in the mid section that is widely patent. Prior to stent is   a focal discrete narrowing of 40-50%. Distal to the stent is moderate   disease of 30%. The distal Neeva Trew vessels also have mild disease of 30%.   5. Left ventricle: Normal end-systolic and end-diastolic dimensions. Overall   left ventricular function is well preserved. Ejection fraction is greater   than 65%. No mitral regurgitation. LV pressure is 180/10, aortic is   180/90. LV EDP equals 20.   ASSESSMENT AND PLAN: Mr. Dobek is a 71 year old gentleman with moderate   three-vessel coronary artery disease. The patients plaque burden in the left   anterior descending artery and circumflex appears to have increased somewhat   compared to his previous angiogram. Unfortunately, the mid and distal  left   anterior descending artery is a small caliber vessel with a long diffuse   segment of disease and would be associated with a high restenosis rate.   Similarly, the circumflex would suffer the same fate.   Further assessment with a stress imaging study would be prudent to isolate the   area of ischemia. Percutaneous intervention may then be targeted if   indicated. In addition, aggressive medical therapy should be pursued as the   patient currently is not on any anti-anginal therapy and has poorly controlled   hypertension. The patient will also be counseled regarding smoking cessation.      07/2013 PFTs   Mild obstruction       10/2014 Lexiscan   Unable to adequately ambulate to treadmill due to right hip pain. Lexiscan employed.   Defect 1: There is a small defect of mild severity present in the mid inferior and apical inferior location. This defect is partially reversible.   This is a low risk study.   Nuclear stress EF: 58%.   Small region of apical inferior ischemia      07/2015 Echo Study Conclusions   - Left ventricle: The cavity size was normal. Wall thickness was   increased increased in a pattern of mild to moderate LVH.   Systolic function was normal. The estimated ejection fraction was   in the range of 60% to 65%. Diastolic function is abnormal,   indeterminate grade. Wall motion was normal; there were no   regional wall motion abnormalities. - Aortic valve: Mildly calcified annulus. Trileaflet; mildly   thickened leaflets. Valve area (VTI): 2.39 cm^2. Valve area   (Vmax): 2.58 cm^2. Valve area (Vmean): 2.7 cm^2. - Mitral valve: Mildly calcified annulus. Normal thickness leaflets   . - Technically adequate study.   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 Recommend followup by ultrasound in 2 years. This recommendation follows ACR consensus guidelines:       Jan 2019 nuclear stress T wave inversions in inferior leads and nonspecific T wave abnormalities in V6 seen throughout study. The study is normal. No myocardial ischemia or scar. This  is a low risk study. Nuclear stress EF: 56%.     07/2017 event monitor 14 day event monitor Min HR 51, Max HR 115, Avg HR 69 Telemetry tracings show sinus rhythm and rate controlled atrial fibrillation Reported symptoms correlated with rate controlled afib     07/2019 cath Mid LM to Dist LM lesion is 25% stenosed. Mid LAD lesion is 40% stenosed. 1st Diag-1 lesion is 80% stenosed. 1st Diag-2 lesion is 90% stenosed. 1st Diag-3 lesion is 90% stenosed. Ost Cx to Prox Cx lesion is 75% stenosed. Mid Cx lesion is 90% stenosed. Prox RCA lesion is 35% stenosed. Dist RCA lesion is 50% stenosed. The left ventricular systolic function is normal. LV end diastolic pressure is normal. The left ventricular ejection fraction is 55-65% by visual estimate.   1. Severe 2 vessel obstructive CAD. The RCA and LAD disease is stable compared to 2003. There is progressive disease in a large diagonal Ciearra Rufo and the LCx    - the diagonal is a large bifurcating vessel.  It has multiple severe stenoses in the main Alexyss Balzarini with severely calcified lesions    - 75% proximal LCx and 90% mid LCx. There is an acutely angulated take off from the left main and the vessel is severely calcified. 2. Normal LV function 3. Normal LVEDP   Plan: discussed with Dr Burt Knack. The diagonal is poorly suited for PCI due to diffuse and heavily calcified disease. The LCx is complex and would require atherectomy. The acute angulation would make it technically difficult. For now would recommend aggressive medical therapy   09/2021 echo 1. Left ventricular ejection fraction, by estimation, is 55 to 60%. Left  ventricular ejection fraction by 2D MOD biplane is 58.8 %. The left  ventricle has normal function. The left ventricle demonstrates regional   wall motion abnormalities (see scoring  diagram/findings for description). There is mild left ventricular  hypertrophy. Left ventricular diastolic function could not be evaluated.  There is moderate hypokinesis of the left ventricular, basal inferolateral  wall.   2. Right ventricular systolic function is normal. The right ventricular  size is normal. Tricuspid regurgitation signal is inadequate for assessing  PA pressure.   3. Calcified interatrial septum which is aneruysmal.   4. The mitral valve is abnormal. Trivial mitral valve regurgitation.   5. The aortic valve was not well visualized. Aortic valve regurgitation  is not visualized. No aortic stenosis is present.   6. The inferior vena cava is normal in size with greater than 50%  respiratory variability, suggesting right atrial pressure of 3 mmHg.   Assessment and Plan   1. Paroxysmal aflutter/Afib/ Tachybrady syndrome with pacemaker -off anticoag due to prior GI bleeding. Was deemed not a candidate for watchman after EP evaluation - no symptoms, cotinue current meds   2. HTN - at goal, continue current meds   3. CAD   -recent NSTEMI as listed above, unable to cross LCX or D1 with wire. Managed medically. Plan for 1 year DAPT, can hold as needed for procedures - no symptoms, continue current meds   4.Hyperlipidemia - has been at goal, continue current meds    Arnoldo Lenis, M.D..

## 2022-06-03 ENCOUNTER — Ambulatory Visit (INDEPENDENT_AMBULATORY_CARE_PROVIDER_SITE_OTHER): Payer: Medicare Other

## 2022-06-03 DIAGNOSIS — R55 Syncope and collapse: Secondary | ICD-10-CM | POA: Diagnosis not present

## 2022-06-05 LAB — CUP PACEART REMOTE DEVICE CHECK
Battery Remaining Longevity: 120 mo
Battery Remaining Percentage: 100 %
Brady Statistic RA Percent Paced: 42 %
Brady Statistic RV Percent Paced: 45 %
Date Time Interrogation Session: 20240117044100
Implantable Lead Connection Status: 753985
Implantable Lead Connection Status: 753985
Implantable Lead Implant Date: 20190514
Implantable Lead Implant Date: 20190514
Implantable Lead Location: 753859
Implantable Lead Location: 753860
Implantable Lead Model: 7741
Implantable Lead Model: 7742
Implantable Lead Serial Number: 1015225
Implantable Lead Serial Number: 1020907
Implantable Pulse Generator Implant Date: 20190514
Lead Channel Impedance Value: 1121 Ohm
Lead Channel Impedance Value: 681 Ohm
Lead Channel Pacing Threshold Amplitude: 0.7 V
Lead Channel Pacing Threshold Amplitude: 1.5 V
Lead Channel Pacing Threshold Pulse Width: 0.4 ms
Lead Channel Pacing Threshold Pulse Width: 0.4 ms
Lead Channel Setting Pacing Amplitude: 2 V
Lead Channel Setting Pacing Amplitude: 2.5 V
Lead Channel Setting Pacing Pulse Width: 0.4 ms
Lead Channel Setting Sensing Sensitivity: 1.5 mV
Pulse Gen Serial Number: 832937
Zone Setting Status: 755011

## 2022-07-07 ENCOUNTER — Encounter (HOSPITAL_COMMUNITY): Payer: Self-pay

## 2022-07-07 ENCOUNTER — Emergency Department (HOSPITAL_COMMUNITY): Payer: Medicare Other

## 2022-07-07 ENCOUNTER — Other Ambulatory Visit: Payer: Self-pay

## 2022-07-07 ENCOUNTER — Inpatient Hospital Stay (HOSPITAL_COMMUNITY)
Admission: EM | Admit: 2022-07-07 | Discharge: 2022-07-23 | DRG: 280 | Disposition: A | Discharge status: Skilled Nursing Facility | Payer: Medicare Other | Attending: Internal Medicine | Admitting: Internal Medicine

## 2022-07-07 DIAGNOSIS — E871 Hypo-osmolality and hyponatremia: Secondary | ICD-10-CM | POA: Diagnosis present

## 2022-07-07 DIAGNOSIS — Z87891 Personal history of nicotine dependence: Secondary | ICD-10-CM

## 2022-07-07 DIAGNOSIS — Y712 Prosthetic and other implants, materials and accessory cardiovascular devices associated with adverse incidents: Secondary | ICD-10-CM | POA: Diagnosis present

## 2022-07-07 DIAGNOSIS — R079 Chest pain, unspecified: Secondary | ICD-10-CM | POA: Diagnosis present

## 2022-07-07 DIAGNOSIS — N186 End stage renal disease: Secondary | ICD-10-CM | POA: Diagnosis not present

## 2022-07-07 DIAGNOSIS — T50905A Adverse effect of unspecified drugs, medicaments and biological substances, initial encounter: Secondary | ICD-10-CM | POA: Diagnosis not present

## 2022-07-07 DIAGNOSIS — F32A Depression, unspecified: Secondary | ICD-10-CM | POA: Diagnosis present

## 2022-07-07 DIAGNOSIS — I700 Atherosclerosis of aorta: Secondary | ICD-10-CM | POA: Diagnosis present

## 2022-07-07 DIAGNOSIS — E1165 Type 2 diabetes mellitus with hyperglycemia: Secondary | ICD-10-CM | POA: Diagnosis present

## 2022-07-07 DIAGNOSIS — G928 Other toxic encephalopathy: Secondary | ICD-10-CM | POA: Diagnosis not present

## 2022-07-07 DIAGNOSIS — I214 Non-ST elevation (NSTEMI) myocardial infarction: Secondary | ICD-10-CM | POA: Diagnosis not present

## 2022-07-07 DIAGNOSIS — I5032 Chronic diastolic (congestive) heart failure: Secondary | ICD-10-CM | POA: Diagnosis present

## 2022-07-07 DIAGNOSIS — M898X9 Other specified disorders of bone, unspecified site: Secondary | ICD-10-CM | POA: Diagnosis present

## 2022-07-07 DIAGNOSIS — I2510 Atherosclerotic heart disease of native coronary artery without angina pectoris: Secondary | ICD-10-CM | POA: Diagnosis present

## 2022-07-07 DIAGNOSIS — Z833 Family history of diabetes mellitus: Secondary | ICD-10-CM

## 2022-07-07 DIAGNOSIS — I7143 Infrarenal abdominal aortic aneurysm, without rupture: Secondary | ICD-10-CM | POA: Diagnosis present

## 2022-07-07 DIAGNOSIS — J449 Chronic obstructive pulmonary disease, unspecified: Secondary | ICD-10-CM | POA: Diagnosis present

## 2022-07-07 DIAGNOSIS — Z66 Do not resuscitate: Secondary | ICD-10-CM | POA: Diagnosis present

## 2022-07-07 DIAGNOSIS — E876 Hypokalemia: Secondary | ICD-10-CM | POA: Diagnosis present

## 2022-07-07 DIAGNOSIS — E1122 Type 2 diabetes mellitus with diabetic chronic kidney disease: Secondary | ICD-10-CM | POA: Diagnosis present

## 2022-07-07 DIAGNOSIS — Z7902 Long term (current) use of antithrombotics/antiplatelets: Secondary | ICD-10-CM

## 2022-07-07 DIAGNOSIS — I252 Old myocardial infarction: Secondary | ICD-10-CM

## 2022-07-07 DIAGNOSIS — I4892 Unspecified atrial flutter: Secondary | ICD-10-CM | POA: Diagnosis present

## 2022-07-07 DIAGNOSIS — R001 Bradycardia, unspecified: Secondary | ICD-10-CM | POA: Diagnosis present

## 2022-07-07 DIAGNOSIS — Z9049 Acquired absence of other specified parts of digestive tract: Secondary | ICD-10-CM

## 2022-07-07 DIAGNOSIS — I25110 Atherosclerotic heart disease of native coronary artery with unstable angina pectoris: Secondary | ICD-10-CM | POA: Diagnosis present

## 2022-07-07 DIAGNOSIS — N2889 Other specified disorders of kidney and ureter: Secondary | ICD-10-CM | POA: Diagnosis present

## 2022-07-07 DIAGNOSIS — K551 Chronic vascular disorders of intestine: Secondary | ICD-10-CM | POA: Diagnosis not present

## 2022-07-07 DIAGNOSIS — E1151 Type 2 diabetes mellitus with diabetic peripheral angiopathy without gangrene: Secondary | ICD-10-CM | POA: Diagnosis present

## 2022-07-07 DIAGNOSIS — Z6833 Body mass index (BMI) 33.0-33.9, adult: Secondary | ICD-10-CM

## 2022-07-07 DIAGNOSIS — I132 Hypertensive heart and chronic kidney disease with heart failure and with stage 5 chronic kidney disease, or end stage renal disease: Secondary | ICD-10-CM | POA: Diagnosis present

## 2022-07-07 DIAGNOSIS — I5042 Chronic combined systolic (congestive) and diastolic (congestive) heart failure: Secondary | ICD-10-CM | POA: Diagnosis not present

## 2022-07-07 DIAGNOSIS — Z79899 Other long term (current) drug therapy: Secondary | ICD-10-CM

## 2022-07-07 DIAGNOSIS — Z992 Dependence on renal dialysis: Secondary | ICD-10-CM

## 2022-07-07 DIAGNOSIS — Z950 Presence of cardiac pacemaker: Secondary | ICD-10-CM

## 2022-07-07 DIAGNOSIS — I200 Unstable angina: Secondary | ICD-10-CM

## 2022-07-07 DIAGNOSIS — E669 Obesity, unspecified: Secondary | ICD-10-CM | POA: Diagnosis present

## 2022-07-07 DIAGNOSIS — J4489 Other specified chronic obstructive pulmonary disease: Secondary | ICD-10-CM | POA: Diagnosis present

## 2022-07-07 DIAGNOSIS — E782 Mixed hyperlipidemia: Secondary | ICD-10-CM | POA: Diagnosis not present

## 2022-07-07 DIAGNOSIS — T82855A Stenosis of coronary artery stent, initial encounter: Secondary | ICD-10-CM | POA: Diagnosis present

## 2022-07-07 DIAGNOSIS — E1121 Type 2 diabetes mellitus with diabetic nephropathy: Secondary | ICD-10-CM | POA: Diagnosis present

## 2022-07-07 DIAGNOSIS — Z95820 Peripheral vascular angioplasty status with implants and grafts: Secondary | ICD-10-CM

## 2022-07-07 DIAGNOSIS — Z885 Allergy status to narcotic agent status: Secondary | ICD-10-CM

## 2022-07-07 DIAGNOSIS — Z7982 Long term (current) use of aspirin: Secondary | ICD-10-CM

## 2022-07-07 DIAGNOSIS — R0603 Acute respiratory distress: Secondary | ICD-10-CM | POA: Diagnosis not present

## 2022-07-07 DIAGNOSIS — D631 Anemia in chronic kidney disease: Secondary | ICD-10-CM | POA: Diagnosis present

## 2022-07-07 DIAGNOSIS — Z8719 Personal history of other diseases of the digestive system: Secondary | ICD-10-CM

## 2022-07-07 DIAGNOSIS — I482 Chronic atrial fibrillation, unspecified: Secondary | ICD-10-CM | POA: Diagnosis present

## 2022-07-07 DIAGNOSIS — I2582 Chronic total occlusion of coronary artery: Secondary | ICD-10-CM | POA: Diagnosis present

## 2022-07-07 LAB — CBC WITH DIFFERENTIAL/PLATELET
Abs Immature Granulocytes: 0.17 10*3/uL — ABNORMAL HIGH (ref 0.00–0.07)
Basophils Absolute: 0 10*3/uL (ref 0.0–0.1)
Basophils Relative: 0 %
Eosinophils Absolute: 0 10*3/uL (ref 0.0–0.5)
Eosinophils Relative: 0 %
HCT: 34.4 % — ABNORMAL LOW (ref 39.0–52.0)
Hemoglobin: 11.7 g/dL — ABNORMAL LOW (ref 13.0–17.0)
Immature Granulocytes: 1 %
Lymphocytes Relative: 9 %
Lymphs Abs: 1.2 10*3/uL (ref 0.7–4.0)
MCH: 35.1 pg — ABNORMAL HIGH (ref 26.0–34.0)
MCHC: 34 g/dL (ref 30.0–36.0)
MCV: 103.3 fL — ABNORMAL HIGH (ref 80.0–100.0)
Monocytes Absolute: 0.6 10*3/uL (ref 0.1–1.0)
Monocytes Relative: 5 %
Neutro Abs: 11.7 10*3/uL — ABNORMAL HIGH (ref 1.7–7.7)
Neutrophils Relative %: 85 %
Platelets: 175 10*3/uL (ref 150–400)
RBC: 3.33 MIL/uL — ABNORMAL LOW (ref 4.22–5.81)
RDW: 13.7 % (ref 11.5–15.5)
WBC: 13.8 10*3/uL — ABNORMAL HIGH (ref 4.0–10.5)
nRBC: 0 % (ref 0.0–0.2)

## 2022-07-07 LAB — COMPREHENSIVE METABOLIC PANEL
ALT: 16 U/L (ref 0–44)
AST: 22 U/L (ref 15–41)
Albumin: 2.9 g/dL — ABNORMAL LOW (ref 3.5–5.0)
Alkaline Phosphatase: 99 U/L (ref 38–126)
Anion gap: 12 (ref 5–15)
BUN: 62 mg/dL — ABNORMAL HIGH (ref 8–23)
CO2: 22 mmol/L (ref 22–32)
Calcium: 8.9 mg/dL (ref 8.9–10.3)
Chloride: 88 mmol/L — ABNORMAL LOW (ref 98–111)
Creatinine, Ser: 5.26 mg/dL — ABNORMAL HIGH (ref 0.61–1.24)
GFR, Estimated: 11 mL/min — ABNORMAL LOW (ref >60)
Glucose, Bld: 307 mg/dL — ABNORMAL HIGH (ref 70–99)
Potassium: 3.4 mmol/L — ABNORMAL LOW (ref 3.5–5.1)
Sodium: 122 mmol/L — ABNORMAL LOW (ref 135–145)
Total Bilirubin: 0.2 mg/dL — ABNORMAL LOW (ref 0.3–1.2)
Total Protein: 6.7 g/dL (ref 6.5–8.1)

## 2022-07-07 LAB — TROPONIN I (HIGH SENSITIVITY)
Troponin I (High Sensitivity): 41 ng/L — ABNORMAL HIGH (ref <18)
Troponin I (High Sensitivity): 41 ng/L — ABNORMAL HIGH (ref <18)

## 2022-07-07 MED ORDER — NITROGLYCERIN 0.4 MG SL SUBL
0.4000 mg | SUBLINGUAL_TABLET | Freq: Once | SUBLINGUAL | Status: AC
  Administered 2022-07-07: 0.4 mg via SUBLINGUAL

## 2022-07-07 MED ORDER — SERTRALINE HCL 50 MG PO TABS
25.0000 mg | ORAL_TABLET | Freq: Every day | ORAL | Status: DC
  Administered 2022-07-08 – 2022-07-23 (×15): 25 mg via ORAL
  Filled 2022-07-07 (×15): qty 1

## 2022-07-07 MED ORDER — RISPERIDONE 0.5 MG PO TABS
0.5000 mg | ORAL_TABLET | Freq: Every day | ORAL | Status: DC
  Administered 2022-07-08 – 2022-07-16 (×10): 0.5 mg via ORAL
  Filled 2022-07-07 (×10): qty 1

## 2022-07-07 MED ORDER — HEPARIN (PORCINE) 25000 UT/250ML-% IV SOLN
1600.0000 [IU]/h | INTRAVENOUS | Status: DC
  Administered 2022-07-07 – 2022-07-08 (×2): 1600 [IU]/h via INTRAVENOUS
  Filled 2022-07-07 (×2): qty 250

## 2022-07-07 MED ORDER — CARVEDILOL 25 MG PO TABS
25.0000 mg | ORAL_TABLET | Freq: Two times a day (BID) | ORAL | Status: DC
  Administered 2022-07-08 – 2022-07-13 (×11): 25 mg via ORAL
  Filled 2022-07-07 (×12): qty 1

## 2022-07-07 MED ORDER — INSULIN ASPART 100 UNIT/ML IJ SOLN
0.0000 [IU] | Freq: Every day | INTRAMUSCULAR | Status: DC
  Administered 2022-07-08: 4 [IU] via SUBCUTANEOUS
  Administered 2022-07-10 – 2022-07-21 (×2): 2 [IU] via SUBCUTANEOUS

## 2022-07-07 MED ORDER — BUPROPION HCL ER (XL) 150 MG PO TB24: 150.0000 mg | ORAL_TABLET | Freq: Every day | ORAL | Status: DC

## 2022-07-07 MED ORDER — RENA-VITE PO TABS
1.0000 | ORAL_TABLET | Freq: Every day | ORAL | Status: DC
  Administered 2022-07-08 – 2022-07-23 (×15): 1 via ORAL
  Filled 2022-07-07 (×15): qty 1

## 2022-07-07 MED ORDER — HEPARIN BOLUS VIA INFUSION
4000.0000 [IU] | Freq: Once | INTRAVENOUS | Status: AC
  Administered 2022-07-07: 4000 [IU] via INTRAVENOUS

## 2022-07-07 MED ORDER — ONDANSETRON HCL 4 MG/2ML IJ SOLN
4.0000 mg | Freq: Four times a day (QID) | INTRAMUSCULAR | Status: DC | PRN
  Administered 2022-07-13 – 2022-07-18 (×2): 4 mg via INTRAVENOUS
  Filled 2022-07-07 (×2): qty 2

## 2022-07-07 MED ORDER — AMLODIPINE BESYLATE 5 MG PO TABS
5.0000 mg | ORAL_TABLET | Freq: Every day | ORAL | Status: DC
  Administered 2022-07-08 – 2022-07-13 (×6): 5 mg via ORAL
  Filled 2022-07-07 (×6): qty 1

## 2022-07-07 MED ORDER — ROPINIROLE HCL 1 MG PO TABS
1.0000 mg | ORAL_TABLET | Freq: Every day | ORAL | Status: DC
  Administered 2022-07-08 – 2022-07-16 (×9): 1 mg via ORAL
  Filled 2022-07-07 (×10): qty 1

## 2022-07-07 MED ORDER — FENTANYL CITRATE PF 50 MCG/ML IJ SOSY
50.0000 ug | PREFILLED_SYRINGE | Freq: Once | INTRAMUSCULAR | Status: AC
  Administered 2022-07-07: 50 ug via INTRAVENOUS
  Filled 2022-07-07: qty 1

## 2022-07-07 MED ORDER — ALBUTEROL SULFATE (2.5 MG/3ML) 0.083% IN NEBU
2.5000 mg | INHALATION_SOLUTION | Freq: Four times a day (QID) | RESPIRATORY_TRACT | Status: DC | PRN
  Administered 2022-07-08 – 2022-07-14 (×4): 2.5 mg via RESPIRATORY_TRACT
  Filled 2022-07-07 (×4): qty 3

## 2022-07-07 MED ORDER — CLOPIDOGREL BISULFATE 75 MG PO TABS
75.0000 mg | ORAL_TABLET | Freq: Every day | ORAL | Status: DC
  Administered 2022-07-08 – 2022-07-23 (×15): 75 mg via ORAL
  Filled 2022-07-07 (×15): qty 1

## 2022-07-07 MED ORDER — ASPIRIN 81 MG PO TBEC
81.0000 mg | DELAYED_RELEASE_TABLET | Freq: Every day | ORAL | Status: DC
  Administered 2022-07-08 – 2022-07-23 (×16): 81 mg via ORAL
  Filled 2022-07-07 (×16): qty 1

## 2022-07-07 MED ORDER — ATORVASTATIN CALCIUM 40 MG PO TABS
40.0000 mg | ORAL_TABLET | Freq: Every day | ORAL | Status: DC
  Administered 2022-07-08 – 2022-07-23 (×15): 40 mg via ORAL
  Filled 2022-07-07 (×15): qty 1

## 2022-07-07 MED ORDER — BUDESONIDE 0.25 MG/2ML IN SUSP
2.0000 mL | Freq: Two times a day (BID) | RESPIRATORY_TRACT | Status: DC
  Administered 2022-07-07 – 2022-07-23 (×28): 0.25 mg via RESPIRATORY_TRACT
  Filled 2022-07-07 (×33): qty 2

## 2022-07-07 MED ORDER — PANTOPRAZOLE SODIUM 40 MG PO TBEC
40.0000 mg | DELAYED_RELEASE_TABLET | Freq: Every day | ORAL | Status: DC
  Administered 2022-07-08 – 2022-07-23 (×15): 40 mg via ORAL
  Filled 2022-07-07 (×15): qty 1

## 2022-07-07 MED ORDER — CALCIUM CITRATE 950 (200 CA) MG PO TABS
200.0000 mg | ORAL_TABLET | Freq: Every day | ORAL | Status: DC
  Administered 2022-07-08 – 2022-07-22 (×14): 200 mg via ORAL
  Filled 2022-07-07 (×16): qty 1

## 2022-07-07 MED ORDER — ACETAMINOPHEN 325 MG PO TABS
650.0000 mg | ORAL_TABLET | ORAL | Status: DC | PRN
  Administered 2022-07-11 – 2022-07-22 (×8): 650 mg via ORAL
  Filled 2022-07-07 (×9): qty 2

## 2022-07-07 MED ORDER — INSULIN ASPART 100 UNIT/ML IJ SOLN
0.0000 [IU] | Freq: Three times a day (TID) | INTRAMUSCULAR | Status: DC
  Administered 2022-07-08 (×2): 5 [IU] via SUBCUTANEOUS
  Administered 2022-07-09: 3 [IU] via SUBCUTANEOUS
  Administered 2022-07-09: 5 [IU] via SUBCUTANEOUS
  Administered 2022-07-09: 3 [IU] via SUBCUTANEOUS
  Administered 2022-07-10 (×2): 2 [IU] via SUBCUTANEOUS
  Administered 2022-07-10 – 2022-07-11 (×3): 3 [IU] via SUBCUTANEOUS
  Administered 2022-07-11: 2 [IU] via SUBCUTANEOUS
  Administered 2022-07-12 (×3): 3 [IU] via SUBCUTANEOUS
  Administered 2022-07-13: 2 [IU] via SUBCUTANEOUS
  Administered 2022-07-13: 3 [IU] via SUBCUTANEOUS
  Administered 2022-07-13: 2 [IU] via SUBCUTANEOUS
  Administered 2022-07-14: 3 [IU] via SUBCUTANEOUS
  Administered 2022-07-14 – 2022-07-15 (×4): 2 [IU] via SUBCUTANEOUS
  Administered 2022-07-16: 3 [IU] via SUBCUTANEOUS
  Administered 2022-07-16 – 2022-07-17 (×3): 2 [IU] via SUBCUTANEOUS
  Administered 2022-07-17: 3 [IU] via SUBCUTANEOUS
  Administered 2022-07-18 – 2022-07-19 (×2): 2 [IU] via SUBCUTANEOUS

## 2022-07-07 NOTE — Progress Notes (Signed)
ANTICOAGULATION CONSULT NOTE - Initial Consult  Pharmacy Consult for Heparin Indication: chest pain/ACS  Allergies  Allergen Reactions   Hydrocodone Itching   Hydrocodone-Acetaminophen Itching   Other     Opioids cause hallucinations and delusions has to be given risperidone to settle down Other reaction(s): hallucinations/delirium  Opiates (Uncoded) Reaction:  Serotonin Syndrome     Oxycodone-Acetaminophen Other (See Comments)    Patient Measurements: Height: 6' 4"$  (193 cm) Weight: 122 kg (269 lb) IBW/kg (Calculated) : 86.8 Heparin Dosing Weight: 112 kg  Vital Signs: Temp: 97.8 F (36.6 C) (02/18 1813) Temp Source: Oral (02/18 1813) BP: 131/60 (02/18 1813) Pulse Rate: 70 (02/18 1813)  Labs: Recent Labs    07/07/22 1704  HGB 11.7*  HCT 34.4*  PLT 175  CREATININE 5.26*  TROPONINIHS 41*    Estimated Creatinine Clearance: 18.9 mL/min (A) (by C-G formula based on SCr of 5.26 mg/dL (H)).   Medical History: Past Medical History:  Diagnosis Date   AAA (abdominal aortic aneurysm) ([place]) 06/2016   Mild increase in size of 3.4 cm infrarenal abdominal aortic aneurysm   Anemia    Aortic atherosclerosis (H[facility])    Arthritis    Asthma    Atrial flutter (HCC)    AVF (arteriovenous fistula) (HCC)    Left forearm   CAD (coronary artery disease)    a. s/p prior stenting of RCA in 1999 b. cath in 2003 showing moderate CAD c. NST in 2016 showing small area of ischemic and low-risk   CHF (congestive heart failure) ([place])    CKD (chronic kidney disease), stage V ([place])    kidney transplant evaluation   COPD (chronic obstructive pulmonary disease) ([place])    with ongoing tobacco use and patient failed sham takes   Diverticulosis 2018   Mild sigmoid colon diverticulosis.   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    Reflux esophagitis    hx   Two-vessel coronary artery disease    moderate. by cath in 2003. Status post stenting of the mdi RCA November 1999 normal left ventricular ejection fraction   Wears glasses    Wears hearing aid    B/L    Medications:  Awaiting home med rec  Assessment: 72 y.o. M presents with CP. To begin heparin for ACS. No AC PTA - had been on Eliquis in the past for PAF but stopped 10/2018 due to GIB/anemia. CBC ok on admission.  Goal of Therapy:  Heparin level 0.3-0.7 units/ml Monitor platelets by anticoagulation protocol: Yes   Plan:  Heparin IV bolus 4000 units Heparin gtt at 1600 units/hr Will f/u heparin level in 8 hours Daily heparin level and CBC  Sherlon Handing, PharmD, BCPS Please see amion for complete clinical pharmacist phone list 07/07/2022,7:17 PM

## 2022-07-07 NOTE — ED Triage Notes (Signed)
Complaining of chest pain for the last 2 weeks. Woke up at 3 this am took 2 nitro, and had another episode about 15 min ago. Hurts in the center of chest and does not radiate.

## 2022-07-07 NOTE — Assessment & Plan Note (Signed)
-  Continue Bupropion and Zoloft

## 2022-07-07 NOTE — Assessment & Plan Note (Signed)
Continue statin. 

## 2022-07-07 NOTE — Assessment & Plan Note (Signed)
-  Na 122 -defer to nephro

## 2022-07-07 NOTE — Assessment & Plan Note (Signed)
-  EF 55-60% 09/2021 -The LV has regional wall motion abnormalities  -Continue asa, statin, beta blocker, and plavix

## 2022-07-07 NOTE — Assessment & Plan Note (Signed)
-  Transfer to [facility] for peritoneal dialysis -Defer electrolyte management to neprho -Consult neprho - Dr. Jonnie Finner aware of patient -Continue to monitor

## 2022-07-07 NOTE — ED Notes (Signed)
Patient is having intermittent chest pain , reports pain is sharp with increase shortness of breath. EDP notified

## 2022-07-07 NOTE — Assessment & Plan Note (Signed)
-   Continue asa, statin, beta blocker, plavix -Chest pain/Unstable angina today -7 nitro total today. Per Cardiology - start nitro drip if chest pain recurs -EKG without acute st changes -Monitor on tele -Continue heparin per cards recommendations to ED doc -Continue to monitor

## 2022-07-07 NOTE — ED Notes (Signed)
Patient continues to have chest pain after taking nitroglycerin. Reports pain 3/10 EDP notified.

## 2022-07-07 NOTE — Assessment & Plan Note (Signed)
-  K+ 3.4 -Defer to nephro

## 2022-07-07 NOTE — ED Provider Notes (Signed)
[facility] Provider Note   CSN: [id number] Arrival date & time: 07/07/22  1621     History {Add pertinent medical, surgical, social history, OB history to HPI:1} Chief Complaint  Patient presents with   Chest Pain    ONTERRIO RULEY is a 72 y.o. male.  Patient has been having chest discomfort off and on for 2 weeks.  He has had continued chest pain since last night.  Patient has a history of coronary artery disease and is a peritoneal dialysis patient  The history is provided by the patient and medical records.  Chest Pain Pain location:  L chest Pain quality: aching   Pain radiates to:  Does not radiate Pain severity:  Moderate Onset quality:  Sudden Timing:  Constant Progression:  Worsening Chronicity:  New Context: not breathing   Relieved by:  Nothing Worsened by:  Nothing Ineffective treatments:  Nitroglycerin Associated symptoms: no abdominal pain, no back pain, no cough, no fatigue and no headache        Home Medications Prior to Admission medications   Medication Sig Start Date End Date Taking? Authorizing Provider  acetaminophen (TYLENOL) 325 MG tablet Take 650 mg by mouth every 6 (six) hours as needed for mild pain or moderate pain.    [provider]  albuterol (VENTOLIN HFA) 108 (90 Base) MCG/ACT inhaler Inhale 2 puffs into the lungs every 6 (six) hours as needed for wheezing or shortness of breath.     [provider]  amLODipine (NORVASC) 5 MG tablet Take 5 mg by mouth daily.    [provider]  aspirin EC 81 MG EC tablet Take 1 tablet (81 mg total) by mouth daily. 07/25/19   Isaac Bliss, Rayford Halsted, MD  atorvastatin (LIPITOR) 40 MG tablet Take 1 tablet by mouth once daily 03/11/22   Arnoldo Lenis, MD  beclomethasone (QVAR) 80 MCG/ACT inhaler Inhale 2 puffs into the lungs daily as needed (wheezing and shortness of breath).    [provider]  buPROPion (WELLBUTRIN XL)  150 MG 24 hr tablet Take 150 mg by mouth daily. Patient not taking: Reported on 04/10/2022 01/01/22   [provider]  CALCIUM CITRATE PO Take 600 mg by mouth in the morning and at bedtime.    [provider]  carvedilol (COREG) 25 MG tablet Take 1 tablet (25 mg total) by mouth 2 (two) times daily with a meal. 09/13/21   Branch, Alphonse Guild, MD  Cholecalciferol (VITAMIN D3) 2000 units TABS Take 2,000 Units by mouth daily.     [provider]  clobetasol (TEMOVATE) 0.05 % external solution Apply 1 application topically 2 (two) times daily as needed (itching).    [provider]  denosumab (PROLIA) 60 MG/ML SOSY injection Inject 60 mg into the skin every 6 (six) months.    [provider]  gentamicin cream (GARAMYCIN) 0.1 % Apply 1 Application topically daily. 04/15/22   Idamae Schuller, MD  multivitamin (RENA-VIT) TABS tablet Take 1 tablet by mouth daily.    [provider]  nitroGLYCERIN (NITROSTAT) 0.4 MG SL tablet Place 1 tablet (0.4 mg total) under the tongue every 5 (five) minutes as needed for chest pain. 10/22/21   Arnoldo Lenis, MD  pantoprazole (PROTONIX) 40 MG tablet Take 1 tablet (40 mg total) by mouth daily. 10/11/21 10/11/22  Darliss Cheney, MD  PLAVIX 75 MG tablet Take 75 mg by mouth daily.    [provider]  polyethylene  glycol (MIRALAX / GLYCOLAX) 17 g packet Take 17 g by mouth daily as needed for mild constipation or moderate constipation.    [provider]  potassium chloride SA (KLOR-CON M) 20 MEQ tablet Take 20 mEq by mouth every other day. 01/23/22   [provider]  risperiDONE (RISPERDAL) 0.5 MG tablet Take 0.5 mg by mouth at bedtime.    [provider]  rOPINIRole (REQUIP) 1 MG tablet Take 1 mg by mouth daily. 03/02/21   [provider]  sertraline (ZOLOFT) 25 MG tablet Take 25 mg by mouth in the morning. 09/22/20   [provider]  traMADol (ULTRAM) 50 MG tablet Take 50 mg by  mouth every 4 (four) hours as needed for moderate pain. Patient not taking: Reported on 04/10/2022 09/13/21   [provider]      Allergies    Hydrocodone, Hydrocodone-acetaminophen, Other, and Oxycodone-acetaminophen    Review of Systems   Review of Systems  Constitutional:  Negative for appetite change and fatigue.  HENT:  Negative for congestion, ear discharge and sinus pressure.   Eyes:  Negative for discharge.  Respiratory:  Negative for cough.   Cardiovascular:  Positive for chest pain.  Gastrointestinal:  Negative for abdominal pain and diarrhea.  Genitourinary:  Negative for frequency and hematuria.  Musculoskeletal:  Negative for back pain.  Skin:  Negative for rash.  Neurological:  Negative for seizures and headaches.  Psychiatric/Behavioral:  Negative for hallucinations.     Physical Exam Updated Vital Signs BP 131/60 (BP Location: Right Arm)   Pulse 70   Temp 97.8 F (36.6 C) (Oral)   Resp 20   Ht 6' 4"$  (1.93 m)   Wt 122 kg   SpO2 94%   BMI 32.74 kg/m  Physical Exam Vitals and nursing note reviewed.  Constitutional:      Appearance: He is well-developed.  HENT:     Head: Normocephalic.     Nose: Nose normal.  Eyes:     General: No scleral icterus.    Conjunctiva/sclera: Conjunctivae normal.  Neck:     Thyroid: No thyromegaly.  Cardiovascular:     Rate and Rhythm: Normal rate and regular rhythm.     Heart sounds: No murmur heard.    No friction rub. No gallop.  Pulmonary:     Breath sounds: No stridor. No wheezing or rales.  Chest:     Chest wall: No tenderness.  Abdominal:     General: There is no distension.     Tenderness: There is no abdominal tenderness. There is no rebound.  Musculoskeletal:        General: Normal range of motion.     Cervical back: Neck supple.  Lymphadenopathy:     Cervical: No cervical adenopathy.  Skin:    Findings: No erythema or rash.  Neurological:     Mental Status: He is alert and oriented to person,  place, and time.     Motor: No abnormal muscle tone.     Coordination: Coordination normal.  Psychiatric:        Behavior: Behavior normal.     ED Results / Procedures / Treatments   Labs (all labs ordered are listed, but only abnormal results are displayed) Labs Reviewed  CBC WITH DIFFERENTIAL/PLATELET - Abnormal; Notable for the following components:      Result Value   WBC 13.8 (*)    RBC 3.33 (*)    Hemoglobin 11.7 (*)    HCT 34.4 (*)  MCV 103.3 (*)    MCH 35.1 (*)    Neutro Abs 11.7 (*)    Abs Immature Granulocytes 0.17 (*)    All other components within normal limits  COMPREHENSIVE METABOLIC PANEL - Abnormal; Notable for the following components:   Sodium 122 (*)    Potassium 3.4 (*)    Chloride 88 (*)    Glucose, Bld 307 (*)    BUN 62 (*)    Creatinine, Ser 5.26 (*)    Albumin 2.9 (*)    Total Bilirubin 0.2 (*)    GFR, Estimated 11 (*)    All other components within normal limits  TROPONIN I (HIGH SENSITIVITY) - Abnormal; Notable for the following components:   Troponin I (High Sensitivity) 41 (*)    All other components within normal limits  TROPONIN I (HIGH SENSITIVITY) - Abnormal; Notable for the following components:   Troponin I (High Sensitivity) 41 (*)    All other components within normal limits  HEPARIN LEVEL (UNFRACTIONATED)  CBC    EKG EKG Interpretation  Date/Time:  Sunday July 07 2022 16:34:59 EST Ventricular Rate:  70 PR Interval:    QRS Duration: 166 QT Interval:  462 QTC Calculation: 498 R Axis:   -63 Text Interpretation: Ventricular-paced rhythm Abnormal ECG When compared with ECG of 12-Apr-2022 05:05, Vent. rate has increased BY  10 BPM Confirmed by Milton Ferguson [phone]) on 07/07/2022 5:08:09 PM  Radiology DG Chest Port 1 View  Result Date: 07/07/2022 CLINICAL DATA:  72 year old male with history of chest pain and weakness. EXAM: PORTABLE CHEST 1 VIEW COMPARISON:  Chest x-ray 04/09/2022. FINDINGS: Lung volumes are normal. No  consolidative airspace disease. No pleural effusions. No pneumothorax. Cephalization of the pulmonary vasculature, without frank pulmonary edema. Mild cardiomegaly. Upper mediastinal contours are within normal limits. Atherosclerotic calcifications are noted in the thoracic aorta. Left-sided pacemaker device in place with lead tips projecting over the expected location of the right atrium and right ventricle. IMPRESSION: 1. Cardiomegaly with pulmonary venous congestion, but no frank pulmonary edema. 2. Aortic atherosclerosis. Electronically Signed   By: Vinnie Langton M.D.   On: 07/07/2022 17:40    Procedures Procedures  {Document cardiac monitor, telemetry assessment procedure when appropriate:1}  Medications Ordered in ED Medications  heparin bolus via infusion 4,000 Units (has no administration in time range)  heparin ADULT infusion 100 units/mL (25000 units/280m) (has no administration in time range)  nitroGLYCERIN (NITROSTAT) SL tablet 0.4 mg (0.4 mg Sublingual Given 07/07/22 1826)    ED Course/ Medical Decision Making/ A&P  CRITICAL CARE Performed by: JMilton FergusonTotal critical care time: 45 minutes Critical care time was exclusive of separately billable procedures and treating other patients. Critical care was necessary to treat or prevent imminent or life-threatening deterioration. Critical care was time spent personally by me on the following activities: development of treatment plan with patient and/or surrogate as well as nursing, discussions with consultants, evaluation of patient's response to treatment, examination of patient, obtaining history from patient or surrogate, ordering and performing treatments and interventions, ordering and review of laboratory studies, ordering and review of radiographic studies, pulse oximetry and re-evaluation of patient's condition.  {I spoke with cardiologist Dr. GPatsey Bertholdand [facility] service with cardiology consult.  He also wanted the patient started on heparin Click here for ABCD2, HEART and other calculatorsREFRESH Note before signing :1}  Medical Decision Making Amount and/or Complexity of Data Reviewed Labs: ordered. Radiology: ordered.  Risk Prescription drug management. Decision regarding hospitalization.   Patient with acute coronary syndrome.  He is started on heparin and will be admitted to medicine at [facility] with cardiology following  {Document critical care time when appropriate:1} {Document review of labs and clinical decision tools ie heart score, Chads2Vasc2 etc:1}  {Document your independent review of radiology images, and any outside records:1} {Document your discussion with family members, caretakers, and with consultants:1} {Document social determinants of health affecting pt's care:1} {Document your decision making why or why not admission, treatments were needed:1} Final Clinical Impression(s) / ED Diagnoses Final diagnoses:  NSTEMI (non-ST elevated myocardial infarction) ([place])    Rx / DC Orders ED Discharge Orders     None

## 2022-07-07 NOTE — Assessment & Plan Note (Signed)
-  Unstable angina -See plan for CAD -Trop stable 41, 41 -Inpatient consult to cards

## 2022-07-07 NOTE — ED Notes (Signed)
Pt had a BM. Pt has been cleaned and whole bed has been changed. Pt has a brief and malewick in place at this time. Pt resting.

## 2022-07-07 NOTE — ED Notes (Signed)
Patient O2 sat dropped to 88% RA , placed on 2 liters/[place]  O2 sat 96% EDP Notified

## 2022-07-07 NOTE — ED Notes (Signed)
Pt has been cleaned up and a whole bed changed due to BM. Pt has a clean gown, brief and malewick at this time. Pt resting.

## 2022-07-07 NOTE — Assessment & Plan Note (Signed)
-  Continue albuterol inhaler and Qvar  -Clinically not in exacerbation at this time

## 2022-07-07 NOTE — ED Notes (Signed)
Pt was cleaned up due to another BM before transport arrived. Pt has a clean brief on and resting at this time.

## 2022-07-07 NOTE — Assessment & Plan Note (Signed)
-  Currently hyperglycemic at 307 -Sliding scale coverage -NPO after midnight in case of any procedures in the AM -Monitor CBGs

## 2022-07-08 ENCOUNTER — Other Ambulatory Visit (HOSPITAL_COMMUNITY): Payer: Self-pay

## 2022-07-08 ENCOUNTER — Encounter (HOSPITAL_COMMUNITY)
Admission: EM | Disposition: A | Discharge status: Skilled Nursing Facility | Payer: Self-pay | Source: Home / Self Care | Attending: Internal Medicine

## 2022-07-08 ENCOUNTER — Other Ambulatory Visit: Payer: Self-pay

## 2022-07-08 DIAGNOSIS — R0789 Other chest pain: Secondary | ICD-10-CM

## 2022-07-08 DIAGNOSIS — I25112 Atherosclerotic heart disease of native coronary artery with refractory angina pectoris: Secondary | ICD-10-CM

## 2022-07-08 DIAGNOSIS — Z66 Do not resuscitate: Secondary | ICD-10-CM | POA: Diagnosis present

## 2022-07-08 DIAGNOSIS — E876 Hypokalemia: Secondary | ICD-10-CM | POA: Diagnosis present

## 2022-07-08 DIAGNOSIS — T50905A Adverse effect of unspecified drugs, medicaments and biological substances, initial encounter: Secondary | ICD-10-CM | POA: Diagnosis not present

## 2022-07-08 DIAGNOSIS — I25110 Atherosclerotic heart disease of native coronary artery with unstable angina pectoris: Secondary | ICD-10-CM | POA: Diagnosis present

## 2022-07-08 DIAGNOSIS — I700 Atherosclerosis of aorta: Secondary | ICD-10-CM | POA: Diagnosis present

## 2022-07-08 DIAGNOSIS — G928 Other toxic encephalopathy: Secondary | ICD-10-CM | POA: Diagnosis not present

## 2022-07-08 DIAGNOSIS — Z515 Encounter for palliative care: Secondary | ICD-10-CM | POA: Diagnosis not present

## 2022-07-08 DIAGNOSIS — I5032 Chronic diastolic (congestive) heart failure: Secondary | ICD-10-CM | POA: Diagnosis not present

## 2022-07-08 DIAGNOSIS — E871 Hypo-osmolality and hyponatremia: Secondary | ICD-10-CM | POA: Diagnosis present

## 2022-07-08 DIAGNOSIS — I4892 Unspecified atrial flutter: Secondary | ICD-10-CM | POA: Diagnosis not present

## 2022-07-08 DIAGNOSIS — Z7189 Other specified counseling: Secondary | ICD-10-CM | POA: Diagnosis not present

## 2022-07-08 DIAGNOSIS — E669 Obesity, unspecified: Secondary | ICD-10-CM | POA: Diagnosis present

## 2022-07-08 DIAGNOSIS — J438 Other emphysema: Secondary | ICD-10-CM

## 2022-07-08 DIAGNOSIS — E782 Mixed hyperlipidemia: Secondary | ICD-10-CM | POA: Diagnosis present

## 2022-07-08 DIAGNOSIS — F32A Depression, unspecified: Secondary | ICD-10-CM | POA: Diagnosis present

## 2022-07-08 DIAGNOSIS — I132 Hypertensive heart and chronic kidney disease with heart failure and with stage 5 chronic kidney disease, or end stage renal disease: Secondary | ICD-10-CM | POA: Diagnosis present

## 2022-07-08 DIAGNOSIS — I4891 Unspecified atrial fibrillation: Secondary | ICD-10-CM | POA: Diagnosis not present

## 2022-07-08 DIAGNOSIS — R0603 Acute respiratory distress: Secondary | ICD-10-CM | POA: Diagnosis not present

## 2022-07-08 DIAGNOSIS — Z992 Dependence on renal dialysis: Secondary | ICD-10-CM

## 2022-07-08 DIAGNOSIS — D631 Anemia in chronic kidney disease: Secondary | ICD-10-CM | POA: Diagnosis present

## 2022-07-08 DIAGNOSIS — I259 Chronic ischemic heart disease, unspecified: Secondary | ICD-10-CM | POA: Diagnosis not present

## 2022-07-08 DIAGNOSIS — E1121 Type 2 diabetes mellitus with diabetic nephropathy: Secondary | ICD-10-CM

## 2022-07-08 DIAGNOSIS — I214 Non-ST elevation (NSTEMI) myocardial infarction: Secondary | ICD-10-CM | POA: Diagnosis present

## 2022-07-08 DIAGNOSIS — R079 Chest pain, unspecified: Secondary | ICD-10-CM | POA: Diagnosis present

## 2022-07-08 DIAGNOSIS — E1122 Type 2 diabetes mellitus with diabetic chronic kidney disease: Secondary | ICD-10-CM | POA: Diagnosis present

## 2022-07-08 DIAGNOSIS — I200 Unstable angina: Secondary | ICD-10-CM

## 2022-07-08 DIAGNOSIS — R109 Unspecified abdominal pain: Secondary | ICD-10-CM | POA: Diagnosis not present

## 2022-07-08 DIAGNOSIS — N186 End stage renal disease: Secondary | ICD-10-CM

## 2022-07-08 DIAGNOSIS — E1165 Type 2 diabetes mellitus with hyperglycemia: Secondary | ICD-10-CM | POA: Diagnosis present

## 2022-07-08 DIAGNOSIS — I482 Chronic atrial fibrillation, unspecified: Secondary | ICD-10-CM | POA: Diagnosis present

## 2022-07-08 DIAGNOSIS — E1151 Type 2 diabetes mellitus with diabetic peripheral angiopathy without gangrene: Secondary | ICD-10-CM | POA: Diagnosis present

## 2022-07-08 DIAGNOSIS — I10 Essential (primary) hypertension: Secondary | ICD-10-CM | POA: Diagnosis not present

## 2022-07-08 DIAGNOSIS — Y712 Prosthetic and other implants, materials and accessory cardiovascular devices associated with adverse incidents: Secondary | ICD-10-CM | POA: Diagnosis present

## 2022-07-08 DIAGNOSIS — I5042 Chronic combined systolic (congestive) and diastolic (congestive) heart failure: Secondary | ICD-10-CM | POA: Diagnosis present

## 2022-07-08 DIAGNOSIS — T82855A Stenosis of coronary artery stent, initial encounter: Secondary | ICD-10-CM | POA: Diagnosis present

## 2022-07-08 DIAGNOSIS — K551 Chronic vascular disorders of intestine: Secondary | ICD-10-CM | POA: Diagnosis present

## 2022-07-08 HISTORY — PX: LEFT HEART CATH AND CORONARY ANGIOGRAPHY: CATH118249

## 2022-07-08 HISTORY — PX: INTRAVASCULAR PRESSURE WIRE/FFR STUDY: CATH118243

## 2022-07-08 HISTORY — PX: CORONARY PRESSURE/FFR STUDY: CATH118243

## 2022-07-08 LAB — COMPREHENSIVE METABOLIC PANEL
ALT: 16 U/L (ref 0–44)
AST: 25 U/L (ref 15–41)
Albumin: 2.8 g/dL — ABNORMAL LOW (ref 3.5–5.0)
Alkaline Phosphatase: 111 U/L (ref 38–126)
Anion gap: 14 (ref 5–15)
BUN: 67 mg/dL — ABNORMAL HIGH (ref 8–23)
CO2: 23 mmol/L (ref 22–32)
Calcium: 9.2 mg/dL (ref 8.9–10.3)
Chloride: 89 mmol/L — ABNORMAL LOW (ref 98–111)
Creatinine, Ser: 5.4 mg/dL — ABNORMAL HIGH (ref 0.61–1.24)
GFR, Estimated: 11 mL/min — ABNORMAL LOW (ref >60)
Glucose, Bld: 279 mg/dL — ABNORMAL HIGH (ref 70–99)
Potassium: 4.1 mmol/L (ref 3.5–5.1)
Sodium: 126 mmol/L — ABNORMAL LOW (ref 135–145)
Total Bilirubin: 0.6 mg/dL (ref 0.3–1.2)
Total Protein: 6.6 g/dL (ref 6.5–8.1)

## 2022-07-08 LAB — CBC
HCT: 33 % — ABNORMAL LOW (ref 39.0–52.0)
Hemoglobin: 11.6 g/dL — ABNORMAL LOW (ref 13.0–17.0)
MCH: 35.7 pg — ABNORMAL HIGH (ref 26.0–34.0)
MCHC: 35.2 g/dL (ref 30.0–36.0)
MCV: 101.5 fL — ABNORMAL HIGH (ref 80.0–100.0)
Platelets: 176 10*3/uL (ref 150–400)
RBC: 3.25 MIL/uL — ABNORMAL LOW (ref 4.22–5.81)
RDW: 13.7 % (ref 11.5–15.5)
WBC: 15.1 10*3/uL — ABNORMAL HIGH (ref 4.0–10.5)
nRBC: 0 % (ref 0.0–0.2)

## 2022-07-08 LAB — GLUCOSE, CAPILLARY
Glucose-Capillary: 161 mg/dL — ABNORMAL HIGH (ref 70–99)
Glucose-Capillary: 219 mg/dL — ABNORMAL HIGH (ref 70–99)
Glucose-Capillary: 231 mg/dL — ABNORMAL HIGH (ref 70–99)
Glucose-Capillary: 274 mg/dL — ABNORMAL HIGH (ref 70–99)
Glucose-Capillary: 305 mg/dL — ABNORMAL HIGH (ref 70–99)

## 2022-07-08 LAB — TROPONIN I (HIGH SENSITIVITY): Troponin I (High Sensitivity): 53 ng/L — ABNORMAL HIGH (ref <18)

## 2022-07-08 LAB — HEPARIN LEVEL (UNFRACTIONATED)
Heparin Unfractionated: 0.39 IU/mL (ref 0.30–0.70)
Heparin Unfractionated: 0.67 IU/mL (ref 0.30–0.70)

## 2022-07-08 LAB — POCT ACTIVATED CLOTTING TIME: Activated Clotting Time: 293 seconds

## 2022-07-08 LAB — MAGNESIUM: Magnesium: 2.3 mg/dL (ref 1.7–2.4)

## 2022-07-08 SURGERY — LEFT HEART CATH AND CORONARY ANGIOGRAPHY
Anesthesia: LOCAL

## 2022-07-08 MED ORDER — CALCITRIOL 0.25 MCG PO CAPS
0.2500 ug | ORAL_CAPSULE | ORAL | Status: DC
  Administered 2022-07-08: 0.25 ug via ORAL
  Filled 2022-07-08: qty 1

## 2022-07-08 MED ORDER — FENTANYL CITRATE (PF) 100 MCG/2ML IJ SOLN
INTRAMUSCULAR | Status: AC
  Filled 2022-07-08: qty 2

## 2022-07-08 MED ORDER — CHLORHEXIDINE GLUCONATE CLOTH 2 % EX PADS
6.0000 | MEDICATED_PAD | Freq: Every day | CUTANEOUS | Status: DC
  Administered 2022-07-08 – 2022-07-23 (×15): 6 via TOPICAL

## 2022-07-08 MED ORDER — HEPARIN SODIUM (PORCINE) 1000 UNIT/ML IJ SOLN
INTRAMUSCULAR | Status: AC
  Filled 2022-07-08: qty 10

## 2022-07-08 MED ORDER — NITROGLYCERIN 0.4 MG SL SUBL
SUBLINGUAL_TABLET | SUBLINGUAL | Status: AC
  Filled 2022-07-08: qty 1

## 2022-07-08 MED ORDER — HYDRALAZINE HCL 20 MG/ML IJ SOLN: 10.0000 mg | INTRAMUSCULAR | Status: AC | PRN

## 2022-07-08 MED ORDER — SODIUM CHLORIDE 0.9% FLUSH: 3.0000 mL | INTRAVENOUS | Status: DC | PRN

## 2022-07-08 MED ORDER — SODIUM CHLORIDE 0.9 % IV SOLN: 250.0000 mL | INTRAVENOUS | Status: DC | PRN

## 2022-07-08 MED ORDER — LIDOCAINE HCL (PF) 1 % IJ SOLN
INTRAMUSCULAR | Status: DC | PRN
  Administered 2022-07-08: 5 mL

## 2022-07-08 MED ORDER — SODIUM CHLORIDE 0.9% FLUSH
3.0000 mL | Freq: Two times a day (BID) | INTRAVENOUS | Status: DC
  Administered 2022-07-08 – 2022-07-23 (×28): 3 mL via INTRAVENOUS

## 2022-07-08 MED ORDER — MELATONIN 3 MG PO TABS
3.0000 mg | ORAL_TABLET | Freq: Every evening | ORAL | Status: AC | PRN
  Administered 2022-07-09: 3 mg via ORAL
  Filled 2022-07-08: qty 1

## 2022-07-08 MED ORDER — VERAPAMIL HCL 2.5 MG/ML IV SOLN
INTRAVENOUS | Status: AC
  Filled 2022-07-08: qty 2

## 2022-07-08 MED ORDER — MIDAZOLAM HCL 2 MG/2ML IJ SOLN
INTRAMUSCULAR | Status: AC
  Filled 2022-07-08: qty 2

## 2022-07-08 MED ORDER — MIDAZOLAM HCL 2 MG/2ML IJ SOLN
INTRAMUSCULAR | Status: DC | PRN
  Administered 2022-07-08: 1 mg via INTRAVENOUS

## 2022-07-08 MED ORDER — NITROGLYCERIN 0.4 MG SL SUBL
0.4000 mg | SUBLINGUAL_TABLET | SUBLINGUAL | Status: AC | PRN
  Administered 2022-07-08 (×3): 0.4 mg via SUBLINGUAL

## 2022-07-08 MED ORDER — ACETAMINOPHEN 325 MG PO TABS
650.0000 mg | ORAL_TABLET | ORAL | Status: DC | PRN
  Administered 2022-07-19 – 2022-07-23 (×5): 650 mg via ORAL
  Filled 2022-07-08 (×6): qty 2

## 2022-07-08 MED ORDER — VERAPAMIL HCL 2.5 MG/ML IV SOLN
INTRAVENOUS | Status: DC | PRN
  Administered 2022-07-08: 5 mL via INTRA_ARTERIAL

## 2022-07-08 MED ORDER — SODIUM CHLORIDE 0.9% FLUSH: 3.0000 mL | Freq: Two times a day (BID) | INTRAVENOUS | Status: DC

## 2022-07-08 MED ORDER — NITROGLYCERIN 0.4 MG/HR TD PT24
0.4000 mg | MEDICATED_PATCH | Freq: Every day | TRANSDERMAL | Status: AC
  Administered 2022-07-08: 0.4 mg via TRANSDERMAL
  Filled 2022-07-08: qty 1

## 2022-07-08 MED ORDER — SODIUM CHLORIDE 0.9 % IV SOLN: INTRAVENOUS | Status: AC

## 2022-07-08 MED ORDER — HEPARIN SODIUM (PORCINE) 1000 UNIT/ML IJ SOLN
INTRAMUSCULAR | Status: DC | PRN
  Administered 2022-07-08: 8000 [IU] via INTRA_ARTERIAL
  Administered 2022-07-08: 5000 [IU] via INTRA_ARTERIAL

## 2022-07-08 MED ORDER — LABETALOL HCL 5 MG/ML IV SOLN: 10.0000 mg | INTRAVENOUS | Status: AC | PRN

## 2022-07-08 MED ORDER — MORPHINE SULFATE (PF) 2 MG/ML IV SOLN
2.0000 mg | INTRAVENOUS | Status: DC | PRN
  Administered 2022-07-09: 2 mg via INTRAVENOUS
  Filled 2022-07-08 (×2): qty 1

## 2022-07-08 MED ORDER — SODIUM CHLORIDE 0.9 % IV SOLN: INTRAVENOUS | Status: DC

## 2022-07-08 MED ORDER — LIDOCAINE HCL (PF) 1 % IJ SOLN
INTRAMUSCULAR | Status: AC
  Filled 2022-07-08: qty 30

## 2022-07-08 MED ORDER — ONDANSETRON HCL 4 MG/2ML IJ SOLN: 4.0000 mg | Freq: Four times a day (QID) | INTRAMUSCULAR | Status: DC | PRN

## 2022-07-08 MED ORDER — GENTAMICIN SULFATE 0.1 % EX CREA
1.0000 | TOPICAL_CREAM | Freq: Every day | CUTANEOUS | Status: DC
  Administered 2022-07-09 – 2022-07-23 (×12): 1 via TOPICAL
  Filled 2022-07-08: qty 15

## 2022-07-08 MED ORDER — FENTANYL CITRATE (PF) 100 MCG/2ML IJ SOLN
INTRAMUSCULAR | Status: DC | PRN
  Administered 2022-07-08: 25 ug via INTRAVENOUS

## 2022-07-08 SURGICAL SUPPLY — 14 items
CATH INFINITI 5FR ANG PIGTAIL (CATHETERS) IMPLANT
CATH OPTITORQUE TIG 4.0 6F (CATHETERS) IMPLANT
CATH VISTA GUIDE 6FR JR4 (CATHETERS) IMPLANT
DEVICE RAD COMP TR BAND LRG (VASCULAR PRODUCTS) IMPLANT
GLIDESHEATH SLEND SS 6F .021 (SHEATH) IMPLANT
GUIDEWIRE INQWIRE 1.5J.035X260 (WIRE) IMPLANT
GUIDEWIRE PRESSURE X 175 (WIRE) IMPLANT
INQWIRE 1.5J .035X260CM (WIRE) ×1
KIT ESSENTIALS PG (KITS) IMPLANT
KIT HEART LEFT (KITS) ×2 IMPLANT
MAT PREVALON FULL STRYKER (MISCELLANEOUS) IMPLANT
PACK CARDIAC CATHETERIZATION (CUSTOM PROCEDURE TRAY) ×2 IMPLANT
TRANSDUCER W/STOPCOCK (MISCELLANEOUS) ×2 IMPLANT
TUBING CIL FLEX 10 FLL-RA (TUBING) ×2 IMPLANT

## 2022-07-08 NOTE — TOC Benefit Eligibility Note (Signed)
Patient Teacher, English as a foreign language completed.    The patient is currently admitted and upon discharge could be taking ranolazine (Ranexa) 500 mg 12 hr tablet.  The current 30 day co-pay is $169.42 due to a $545.00 deductible.   The patient is insured through [place], [place] Patient Advocate Specialist [place] Patient Advocate Team Direct Number: [phone]  Fax: [phone]

## 2022-07-08 NOTE — Significant Event (Signed)
Rapid Response Event Note   Reason for Call :  CP 10/10  Initial Focused Assessment:  Pt lying in bed on L side. Pt is alert and oriented, anxious,  c/o 10/10 mid sternal CP and SOB. Breathing is tachypneic and labored. Lungs clear t/o. Skin cool to touch, dry.  T-97.6, HR-110, BP-134/78, RR-26, SpO2-91% on 2L [place], 94% on 6L [place].  Interventions:  FiO2 increased from 2L to 6L [place] EKG-v-paced  NTG SL 0.57mg x 3(pain 10>8>7>5) Trop(already ordered) Ntg TD patch. Plan of Care:  Cardiology MD coming to see pt. Place NTG TD patch. Continue to monitor pt. Call RRT if further assistance needed.    Event Summary:   MD Notified: Dr. GPatsey Bertholdnotified by bedside RN and came to bedside Call TBuxtonTime: End Time:0120  HDillard [place] RN

## 2022-07-08 NOTE — H&P (View-Only) (Signed)
Patient seen and examined. Reviewed notes from Dr Patsey Berthold from this am.  72 yo WM with known CAD. Chronic AFib/flutter, pacemaker, ESRD on peritoneal dialysis. Last cath in May 2023 showed occluded LCx and diagonal vessels with otherwise moderate diffuse and calcific disease. Attempted to open the LCx but unable to cross with a wire. He was admitted in November with acute chest/epigastric pain. CT did not reveal cause. Managed medically at that time. Reports that last night he had severe left chest pain. Lasted about an hour. Had several sl Ntg without immediate relief but pain subsided after an hour. States he thought he was having a heart attack. Now pain free. He has been having some intermittent pain over the past several months but usually resolves with Ntg.   Patient is morbidly obese. Pulses are good. Lungs clear No gallop or murmur  He is not a candidate for chronic anticoagulation due to prior GI bleeds from AVMs. He is on chronic DAPT. Not felt to be a candidate for Watchman device.  Ecg shows Afib with wide complex   Troponin 41 flat.   Discussed options with the patient. This includes escalation of medical therapy versus relook cardiac cath. Patient would prefer repeat cath "to find out the cause" of his acute pain. The procedure and risks were reviewed including but not limited to death, myocardial infarction, stroke, arrythmias, bleeding, transfusion, emergency surgery, dye allergy, or renal dysfunction. The patient voices understanding and is agreeable to proceed. Will post on today's schedule.   Dyshon Philbin [place] MD, [facility] 07/08/2022 9:06 AM

## 2022-07-08 NOTE — Progress Notes (Signed)
[facility]  PROGRESS NOTE  [name] [id number] DOB: 19-Mar-1951 DOA: 07/07/2022 PCP: Tobe Sos, MD   Brief HPI:   72 y/o male with medical history of CAD, AAA, aortic atherosclerosis, atrial flutter, coronary disease, CHF, CKD, COPD, ESRD on peritoneal dialysis, diabetes mellitus type 2, hyperlipidemia, hypertension, GERD presented to ED with chief complaint of chest pain.  Patient reports that he has had chest pain intermittently for the past 2 weeks.  Patient has history of CAD, recent cath in 2021, he had significant disease so there was time to place a stent but was not successful so medical management was recommended with aspirin, statin, beta-blocker, Plavix. Patient was transferred to [facility], cardiology was consulted    Subjective   Patient seen and examined, plan for cardiac cath today.   Assessment/Plan:    Unstable angina -Patient started on heparin gtt., aspirin, Coreg, Imdur -Cardiology was consulted -Patient transferred to [facility] for cardiac catheterization  ESRD on peritoneal dialysis -Nephrology consulted  Chronic diastolic heart failure -EF 55 to 60% as of May 2023 -LV has regional wall motion abnormalities -Continue aspirin, statin, beta-blocker, Plavix  Hyponatremia -Patient on peritoneal dialysis -Nephrology following, sodium improved to 126 this morning  Diabetes mellitus with nephropathy -Continue sliding scale insulin  Coronary artery disease -Continue aspirin, statin, beta-blocker, Plavix -Continue monitoring on telemetry  COPD -Continue albuterol inhaler, Qvar  Hyperlipidemia -Continue statin  Medications     amLODipine  5 mg Oral Daily   aspirin EC  81 mg Oral Daily   atorvastatin  40 mg Oral Daily   budesonide  2 mL Inhalation BID   calcitRIOL  0.25 mcg Oral Q M,W,F   calcium citrate  200 mg of elemental calcium Oral Daily   carvedilol  25 mg Oral BID WC   Chlorhexidine Gluconate Cloth  6 each  Topical Daily   clopidogrel  75 mg Oral Daily   gentamicin cream  1 Application Topical Daily   insulin aspart  0-15 Units Subcutaneous TID WC   insulin aspart  0-5 Units Subcutaneous QHS   multivitamin  1 tablet Oral Daily   nitroGLYCERIN  0.4 mg Transdermal Daily   pantoprazole  40 mg Oral Daily   risperiDONE  0.5 mg Oral QHS   rOPINIRole  1 mg Oral Daily   sertraline  25 mg Oral Daily   sodium chloride flush  3 mL Intravenous Q12H     Data Reviewed:   CBG:  Recent Labs  Lab 07/08/22 0011 07/08/22 0149 07/08/22 0613 07/08/22 1219 07/08/22 1640  GLUCAP 274* 305* 219* 161* 231*    SpO2: (!) 88 % O2 Flow Rate (L/min): 2 L/min    Vitals:   07/08/22 1500 07/08/22 1600 07/08/22 1603 07/08/22 1642  BP: 132/75 125/71 125/71   Pulse:   60   Resp: 13 17 20 20  $ Temp:   97.8 F (36.6 C) (!) 97.4 F (36.3 C)  TempSrc:   Axillary Oral  SpO2: 91% 92% 91% (!) 88%  Weight:      Height:          Data Reviewed:  Basic Metabolic Panel: Recent Labs  Lab 07/07/22 1704 07/08/22 0040  NA 122* 126*  K 3.4* 4.1  CL 88* 89*  CO2 22 23  GLUCOSE 307* 279*  BUN 62* 67*  CREATININE 5.26* 5.40*  CALCIUM 8.9 9.2  MG  --  2.3    CBC: Recent Labs  Lab 07/07/22 1704 07/08/22 0040  WBC 13.8*  15.1*  NEUTROABS 11.7*  --   HGB 11.7* 11.6*  HCT 34.4* 33.0*  MCV 103.3* 101.5*  PLT 175 176    LFT Recent Labs  Lab 07/07/22 1704 07/08/22 0040  AST 22 25  ALT 16 16  ALKPHOS 99 111  BILITOT 0.2* 0.6  PROT 6.7 6.6  ALBUMIN 2.9* 2.8*     Antibiotics: Anti-infectives (From admission, onward)    None        DVT prophylaxis: Heparin  Code Status: DNR  Family Communication:    CONSULTS cardiology   Objective    Physical Examination:  General-appears in no acute distress Heart-S1-S2, regular, no murmur auscultated Lungs-clear to auscultation bilaterally, no wheezing or crackles auscultated Abdomen-soft, nontender, no organomegaly Extremities-no  edema in the lower extremities Neuro-alert, oriented x3, no focal deficit noted  Status is: Inpatient:             [place]   [facility] If 7PM-7AM, please contact night-coverage at [email or web address], Office  [phone]   07/08/2022, 5:03 PM  LOS: 0 days

## 2022-07-08 NOTE — Progress Notes (Signed)
ANTICOAGULATION CONSULT NOTE - Follow Up Consult  Pharmacy Consult for heparin Indication:  USAP  Labs: Recent Labs    07/07/22 1704 07/07/22 1849 07/08/22 0040 07/08/22 0432  HGB 11.7*  --  11.6*  --   HCT 34.4*  --  33.0*  --   PLT 175  --  176  --   HEPARINUNFRC  --   --   --  0.39  CREATININE 5.26*  --  5.40*  --   TROPONINIHS 41* 41* 53*  --     Assessment/Plan:  72yo male therapeutic on heparin with initial dosing for CP. Will continue infusion at current rate of 1600 units/hr and confirm stable with additional level.   Wynona Neat, PharmD, BCPS  07/08/2022,5:34 AM

## 2022-07-08 NOTE — Progress Notes (Signed)
Patient seen and examined. Reviewed notes from Dr Patsey Berthold from this am.  72 yo WM with known CAD. Chronic AFib/flutter, pacemaker, ESRD on peritoneal dialysis. Last cath in May 2023 showed occluded LCx and diagonal vessels with otherwise moderate diffuse and calcific disease. Attempted to open the LCx but unable to cross with a wire. He was admitted in November with acute chest/epigastric pain. CT did not reveal cause. Managed medically at that time. Reports that last night he had severe left chest pain. Lasted about an hour. Had several sl Ntg without immediate relief but pain subsided after an hour. States he thought he was having a heart attack. Now pain free. He has been having some intermittent pain over the past several months but usually resolves with Ntg.   Patient is morbidly obese. Pulses are good. Lungs clear No gallop or murmur  He is not a candidate for chronic anticoagulation due to prior GI bleeds from AVMs. He is on chronic DAPT. Not felt to be a candidate for Watchman device.  Ecg shows Afib with wide complex   Troponin 41 flat.   Discussed options with the patient. This includes escalation of medical therapy versus relook cardiac cath. Patient would prefer repeat cath "to find out the cause" of his acute pain. The procedure and risks were reviewed including but not limited to death, myocardial infarction, stroke, arrythmias, bleeding, transfusion, emergency surgery, dye allergy, or renal dysfunction. The patient voices understanding and is agreeable to proceed. Will post on today's schedule.   Kamilo Och Martinique MD, [facility] 07/08/2022 9:06 AM

## 2022-07-08 NOTE — Consult Note (Signed)
Renal Service Consult Note [facility]  [name] 07/08/2022 Albert Blazing, MD Requesting Physician: Dr. Darrick Paul  Reason for Consult: ESRD pt w/ chest pain HPI: The patient is a 72 y.o. year-old w/ PMH as below who presented to ED at [facility] c/o CP for 2 wks. Also SOB and nausea. Hx of [name] in 2021. Stent couldn't be placed and medical Rx was the recommendation. In ED BP's stable, RR 25, wBC 15k, Hb 11.5, glucose 307, BUN 62 creat 5.2, trops 41, 41, 36, 38. ECG showed V pacing. Pt was admitted as [name] and cardiology consulted and IV heparin was started. Pt was transferred to [facility] due to need for peritoneal dialysis and heart cath. We are asked to see for dialysis.   Pt seen in room. He is w/o c/o's at this time. From the wife on the phone she gave me his PD information, see below.   Pt had LHC this am. Not sure results yet.   Pt was here in nov 2023 for abd pain and fevers.  Patient lives in [place] and follows with the [place] group and does dialysis through [facility].  PD fluid culture and cell counts were negative for infection. CT was neg, thought to possibly have chronic mesenteric ischemia. Pain improved w/ supportive care.    ROS - denies CP, no joint pain, no HA, no blurry vision, no rash, no diarrhea, no nausea/ vomiting, no dysuria, no difficulty voiding   Past Medical History  Past Medical History:  Diagnosis Date   AAA (abdominal aortic aneurysm) ([place]) 06/2016   Mild increase in size of 3.4 cm infrarenal abdominal aortic aneurysm   Anemia    Aortic atherosclerosis (HCC)    Arthritis    Asthma    Atrial flutter (HCC)    AVF (arteriovenous fistula) (HCC)    Left forearm   CAD (coronary artery disease)    a. s/p prior stenting of RCA in 1999 b. cath in 2003 showing moderate CAD c. NST in 2016 showing small area of ischemic and low-risk   CHF (congestive heart failure) ([place])    CKD (chronic kidney disease), stage V ([place])    kidney transplant evaluation    COPD (chronic obstructive pulmonary disease) ([place])    with ongoing tobacco use and patient failed sham takes   Diverticulosis 2018   Mild sigmoid colon diverticulosis.   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    Reflux esophagitis    hx   Two-vessel coronary artery disease    moderate. by cath in 2003. Status post stenting of the mdi RCA November 1999 normal left ventricular ejection fraction   Wears glasses    Wears hearing aid    B/L   Past Surgical History  Past Surgical History:  Procedure Laterality Date   A/V FISTULAGRAM Left 04/30/2019   Procedure: A/V FISTULAGRAM;  Surgeon: Oneida Alar,  Jessy Oto, MD;  Location: [place] [facility];  Service: Cardiovascular;  Laterality: Left;   A/V FISTULAGRAM Left 09/22/2019   Procedure: A/V FISTULAGRAM;  Surgeon: Marty Heck, MD;  Location: [place] [facility];  Service: Cardiovascular;  Laterality: Left;   ABDOMINAL SURGERY     APPENDECTOMY     AV FISTULA PLACEMENT Right 03/11/2018   Procedure: CREATION Brachiocephalic Fistula RIGHT ARM;  Surgeon: Rosetta Posner, MD;  Location: [place];  Service: Vascular;  Laterality: Right;   AV FISTULA PLACEMENT Left 05/06/2019   Procedure: LEFT BRACHIOCEPHALIC ARTERIOVENOUS (AV) FISTULA CREATION;  Surgeon: Marty Heck, MD;  Location: [place];  Service: Vascular;  Laterality: Left;   BACK SURGERY     x3; cages in patient's back since 2000   BIOPSY  11/12/2018   Procedure: BIOPSY;   Surgeon: Laurence Spates, MD;  [facility]: [facility];  Service: Endoscopy;;   CARDIAC CATHETERIZATION     stent   CHOLECYSTECTOMY     CLOSED REDUCTION SHOULDER DISLOCATION     COLONOSCOPY W/ BIOPSIES AND POLYPECTOMY     COLONOSCOPY WITH PROPOFOL N/A 11/12/2018   Procedure: COLONOSCOPY WITH PROPOFOL;  Surgeon: Laurence Spates, MD;  [facility];  Service: Endoscopy;  Laterality: N/A;   COLONOSCOPY WITH PROPOFOL N/A 10/02/2021   Procedure: COLONOSCOPY WITH PROPOFOL;  Surgeon: Clarene Essex, MD;  [facility];  Service: Gastroenterology;  Laterality: N/A;   CORONARY ANGIOPLASTY     CYSTOSCOPY WITH INSERTION OF UROLIFT N/A 11/06/2020   Procedure: CYSTOSCOPY WITH INSERTION OF UROLIFT;  Surgeon: Cleon Gustin, MD;  Location: [facility];  Service: Urology;  Laterality: N/A;   diabetic ulcers     DIAGNOSTIC LAPAROSCOPY     DIALYSIS/PERMA CATHETER REMOVAL N/A 09/22/2019   Procedure: DIALYSIS/PERMA CATHETER REMOVAL;  Surgeon: Marty Heck, MD;  Location: [place] [facility];  Service: Cardiovascular;  Laterality: N/A;   ESOPHAGOGASTRODUODENOSCOPY (EGD) WITH PROPOFOL N/A 11/12/2018   Procedure: ESOPHAGOGASTRODUODENOSCOPY (EGD) WITH PROPOFOL;  Surgeon: Laurence Spates, MD;  [facility];  Service: Endoscopy;  Laterality: N/A;   GROIN EXPLORATION     HEMOSTASIS CLIP PLACEMENT  11/12/2018   Procedure: HEMOSTASIS CLIP PLACEMENT;  Surgeon: Laurence Spates, MD;  [facility];  Service: Endoscopy;;   HIP ARTHROPLASTY Left    gamma nail   INSERT / REPLACE / [place] (AV) ARTEGRAFT ARM Left 03/18/2018   Procedure: INSERTION OF ARTERIOVENOUS (AV) FISTULA;  Surgeon: Rosetta Posner, MD;  Location: [place];  Service: Vascular;  Laterality: Left;   IR FLUORO GUIDE CV LINE RIGHT  04/11/2019   IR US GUIDE VASC ACCESS RIGHT  04/11/2019   LEFT HEART CATH AND CORONARY ANGIOGRAPHY N/A 07/23/2019   Procedure: LEFT HEART CATH AND CORONARY  ANGIOGRAPHY;  Surgeon: Martinique, Peter M, MD;  Location: [place] [facility];  Service: Cardiovascular;  Laterality: N/A;   LEFT HEART CATH AND CORONARY ANGIOGRAPHY N/A 10/10/2021   Procedure: LEFT HEART CATH AND CORONARY ANGIOGRAPHY;  Surgeon: [name], MD;  Location: [place] [facility];  Service: Cardiovascular;  Laterality: N/A;   LIGATION ARTERIOVENOUS GORTEX GRAFT Right 03/18/2018   Procedure: LIGATION ARTERIOVENOUS FISTULA;  Surgeon: Rosetta Posner, MD;  Location: [facility];  Service: Vascular;  Laterality: Right;   OSTECTOMY Left 04/23/2017   Procedure: OSTECTOMY LEFT GREAT TOE;  Surgeon: [name], DPM;  Location[facility];  Service: Podiatry;  Laterality: Left;  left great toe   POLYPECTOMY  11/12/2018  Procedure: POLYPECTOMY;  Surgeon: Laurence Spates, MD;  [facility];  Service: Endoscopy;;   POLYPECTOMY  10/02/2021   Procedure: POLYPECTOMY;  Surgeon: Clarene Essex, MD;  Location: [facility];  Service: Gastroenterology;;   SPINAL CORD STIMULATOR INSERTION N/A 12/20/2020   Procedure: Permanent Spinal Cord Stimulator  Lead Implant with Fluoroscopy and Battery Implant;  Surgeon: Reece Agar, MD;  Location: [place];  Service: Neurosurgery;  Laterality: N/A;   TUMOR REMOVAL     small intestine x3   VISCERAL ANGIOGRAPHY N/A 08/02/2019   Procedure: VISCERAL ANGIOGRAPHY;  Surgeon: Waynetta Sandy, MD;  Location: [place] [facility];  Service: Cardiovascular;  Laterality: N/A;   Family History  Family History  Problem Relation Age of Onset   Diabetes Mother    Alcohol abuse Father    Social History  reports that he quit smoking about 2 years ago. His smoking use included cigarettes. He started smoking about 54 years ago. He has never used smokeless tobacco. He reports that he does not currently use alcohol. He reports that he does not use drugs. Allergies  Allergies  Allergen Reactions   Hydrocodone Itching   Hydrocodone-Acetaminophen Itching   Other      Opioids cause hallucinations and delusions has to be given risperidone to settle down Other reaction(s): hallucinations/delirium  Opiates (Uncoded) Reaction:  Serotonin Syndrome     Oxycodone-Acetaminophen Other (See Comments)   Home medications Prior to Admission medications   Medication Sig Start Date End Date Taking? Authorizing Provider  acetaminophen (TYLENOL) 325 MG tablet Take 650 mg by mouth every 6 (six) hours as needed for mild pain or moderate pain.   Yes [provider]  albuterol (VENTOLIN HFA) 108 (90 Base) MCG/ACT inhaler Inhale 2 puffs into the lungs every 6 (six) hours as needed for wheezing or shortness of breath.    Yes [provider]  amLODipine (NORVASC) 5 MG tablet Take 5 mg by mouth daily.   Yes [provider]  aspirin EC 81 MG EC tablet Take 1 tablet (81 mg total) by mouth daily. 07/25/19  Yes Isaac Bliss, Rayford Halsted, MD  atorvastatin (LIPITOR) 40 MG tablet Take 1 tablet by mouth once daily Patient taking differently: Take 40 mg by mouth every evening. 03/11/22  Yes BranchAlphonse Guild, MD  beclomethasone (QVAR) 80 MCG/ACT inhaler Inhale 2 puffs into the lungs daily as needed (wheezing and shortness of breath).   Yes [provider]  bumetanide (BUMEX) 2 MG tablet Take 2 mg by mouth 2 (two) times daily.   Yes [provider]  calcitRIOL (ROCALTROL) 0.25 MCG capsule Take 0.25 mcg by mouth every Monday, Wednesday, and Friday.   Yes [provider]  CALCIUM CITRATE PO Take 600 mg by mouth in the morning and at bedtime.   Yes [provider]  carvedilol (COREG) 25 MG tablet Take 1 tablet (25 mg total) by mouth 2 (two) times daily with a meal. 09/13/21  Yes Branch, Alphonse Guild, MD  Cholecalciferol (VITAMIN D3) 2000 units TABS Take 2,000 Units by mouth daily.    Yes [provider]  clobetasol (TEMOVATE) 0.05 % external solution Apply 1 application topically 2 (two) times daily as needed (itching).   Yes  [provider]  denosumab (PROLIA) 60 MG/ML SOSY injection Inject 60 mg into the skin every 6 (six) months.   Yes [provider]  gentamicin cream (GARAMYCIN) 0.1 % Apply 1 Application topically daily. 04/15/22  Yes Idamae Schuller, MD  linaclotide Rolan Lipa)  290 MCG CAPS capsule Take 145-290 mcg by mouth daily as needed (Constipation).   Yes [provider]  magnesium oxide (MAG-OX) 400 (240 Mg) MG tablet Take 400 mg by mouth daily.   Yes [provider]  Melatonin 10 MG TBDP Take 10 mg by mouth daily.   Yes [provider]  methylPREDNISolone (MEDROL DOSEPAK) 4 MG TBPK tablet Take 4 mg by mouth as directed. Per package directions 07/01/22  Yes [provider]  multivitamin (RENA-VIT) TABS tablet Take 1 tablet by mouth daily.   Yes [provider]  nitroGLYCERIN (NITROSTAT) 0.4 MG SL tablet Place 1 tablet (0.4 mg total) under the tongue every 5 (five) minutes as needed for chest pain. 10/22/21  Yes Branch, Alphonse Guild, MD  pantoprazole (PROTONIX) 40 MG tablet Take 1 tablet (40 mg total) by mouth daily. 10/11/21 10/11/22 Yes Pahwani, Einar Grad, MD  PLAVIX 75 MG tablet Take 75 mg by mouth daily.   Yes [provider]  polyethylene glycol (MIRALAX / GLYCOLAX) 17 g packet Take 17 g by mouth daily as needed for mild constipation or moderate constipation.   Yes [provider]  potassium chloride SA (KLOR-CON M) 20 MEQ tablet Take 20 mEq by mouth every other day. 01/23/22  Yes [provider]  risperiDONE (RISPERDAL) 0.5 MG tablet Take 0.5 mg by mouth at bedtime.   Yes [provider]  rOPINIRole (REQUIP) 1 MG tablet Take 1 mg by mouth at bedtime. 03/02/21  Yes [provider]  sertraline (ZOLOFT) 25 MG tablet Take 25 mg by mouth in the morning. 09/22/20  Yes [provider]     Vitals:   07/08/22 0500 07/08/22 0800 07/08/22 0836 07/08/22 1052  BP: (!) 149/74 (!) 150/82    Pulse:  67 67   Resp: 17 18     Temp:  97.6 F (36.4 C)    TempSrc:  Oral    SpO2: 99% 99% 97% 95%  Weight:      Height:       Exam Gen alert, no distress, obese No rash, cyanosis or gangrene Sclera anicteric, throat clear  No jvd or bruits Chest clear bilat to bases, no rales/ wheezing RRR no MRG Abd soft ntnd no mass or ascites +bs GU normal male MS no joint effusions or deformity Ext no LE or UE edema, no wounds or ulcers Neuro is alert, Ox 3 , nf    PD cath in abd, LUA AVF in place    Home meds include - bumex 33m bid, rocaltrol 0.25 mcg po mwf, norvasc 5, aspirin, lipitor, coreg 25 bid, proliz, protonix, plavix, klor con 20 qod, risperdal, requip, zoloft, prns/ vits/ supps    Na 126  K 4.1 Co2 23  BUN 67  creat 5.40  Ca 9.2  alb 2.8  LFTs wnl     OP PD: f[facility]b [facility], [place] [facility] [place] Per the wife --> 4 exchanges overnight, 3000 fill, 1.5 hr dwell, use greens/ yellows mostly   Dry wt is 269 lbs/ 122kg   CXR - CM, poss vasc congestion, no edema   Assessment/ Plan: Chest pain - hx of CAD. LHC done this am. Per pmd/ cardiology.  [place].  HTN/ volume - no vol excess on exam, lungs clear, CXR vasc congestion. Use all 2.5%.  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  MBD ckd - CCa in range, will  add on phos. Cont home po vdra.  Hyponatremia - not sure cause DM 2 HTN - takes coreg/ norvasc at home COPD       Kelly Splinter  MD [facility] 07/08/2022, 11:47 AM  Recent Labs  Lab 07/07/22 1704 07/08/22 0040  HGB 11.7* 11.6*  ALBUMIN 2.9* 2.8*  CALCIUM 8.9 9.2  CREATININE 5.26* 5.40*  K 3.4* 4.1   Inpatient medications:  [MAR Hold] amLODipine  5 mg Oral Daily   [MAR Hold] aspirin EC  81 mg Oral Daily   [MAR Hold] atorvastatin  40 mg Oral Daily   [MAR Hold] budesonide  2 mL Inhalation BID   [MAR Hold] calcium citrate  200 mg of elemental calcium Oral Daily   [MAR Hold] carvedilol  25 mg Oral BID WC   [MAR Hold] Chlorhexidine  Gluconate Cloth  6 each Topical Daily   [MAR Hold] clopidogrel  75 mg Oral Daily   [MAR Hold] insulin aspart  0-15 Units Subcutaneous TID WC   [MAR Hold] insulin aspart  0-5 Units Subcutaneous QHS   [MAR Hold] multivitamin  1 tablet Oral Daily   nitroGLYCERIN  0.4 mg Transdermal Daily   nitroGLYCERIN       [MAR Hold] pantoprazole  40 mg Oral Daily   [MAR Hold] risperiDONE  0.5 mg Oral QHS   [MAR Hold] rOPINIRole  1 mg Oral Daily   [MAR Hold] sertraline  25 mg Oral Daily   sodium chloride flush  3 mL Intravenous Q12H    sodium chloride     sodium chloride 10 mL/hr at 07/08/22 1056   heparin Stopped (07/08/22 1045)   sodium chloride, [MAR Hold] acetaminophen, [MAR Hold] albuterol, fentaNYL, heparin sodium (porcine), lidocaine (PF), midazolam, [MAR Hold]  morphine injection, nitroGLYCERIN, [MAR Hold] ondansetron (ZOFRAN) IV, Radial Cocktail/Verapamil only, sodium chloride flush

## 2022-07-08 NOTE — Consult Note (Signed)
Cardiology Consultation   Patient ID: [name] MRN: [id number]; DOB: 09-Apr-1951  Admit date: 07/07/2022 Date of Consult: 07/08/2022  PCP:  Tobe Sos, [place] Prov[facility] Cardiologist:  Carlyle Dolly, MD        Patient Profile:   [name] is a 72 y.o. male with a hx of ESRD on PD, HTN, HLD, type 2 DM, carotid artery disease, atrial fibrillation/flutter, CAD, HFpEF and OSA who is being seen 07/08/2022 for the evaluation of unstable angina at the request of Internal Medicine.  History of Present Illness:   [name] is a 72 year old with medical history stated above. From a coronary standpoint he had a prior RCA stent in 1999, he was re-cath'd for NSTEMI in 2021 with decision to pursue medical management due to complex anatomy. In 09/2021 he was admitted with NSTEMI again. His Lcx was noted to be occluded as well as inferior branch of the first diagonal branch. There was an attempt for recanalization but ultimately medical management was pursued.  Patient states that since May last year, his pain frequency was once every few months and whenever it happened it would resolve after a sublingual NTG. Since the past 2 weeks he has been experiencing increasing frequency and duration of the pain. The episodes are described as retrosternal pressure, non pleuritic, radiaiting to the epigastrium. They tend to last minutes and some have lasted up to one hour. Usually with sublingual NTG they resolve. Two nights ago the pain woke him up from sleep, it was 10/10 and did not resolve with NTG. Because of this reason he decided to go to the ED.  On arrival, BP was 154/66 mmHg and RR 25. Remaining vitals wnl. Labs were relevant for elevated white count at 15, Hb 11.6, Na 122, glucose 307, BUN 62, Cr 5.2  Troponin 41 x 2 and 36, 38 -> 2 months prior V paced on ECG   Past Medical History:  Diagnosis Date   AAA (abdominal aortic aneurysm) ([place]) 06/2016   Mild  increase in size of 3.4 cm infrarenal abdominal aortic aneurysm   Anemia    Aortic atherosclerosis (HCC)    Arthritis    Asthma    Atrial flutter (HCC)    AVF (arteriovenous fistula) (HCC)    Left forearm   CAD (coronary artery disease)    a. s/p prior stenting of RCA in 1999 b. cath in 2003 showing moderate CAD c. NST in 2016 showing small area of ischemic and low-risk   CHF (congestive heart failure) ([place])    CKD (chronic kidney disease), stage V ([place])    kidney transplant evaluation   COPD (chronic obstructive pulmonary disease) ([place])    with ongoing tobacco use and patient failed sham takes   Diverticulosis 2018   Mild sigmoid colon diverticulosis.   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    Reflux esophagitis    hx   Two-vessel coronary artery disease    moderate. by cath in 2003. Status post stenting of the mdi RCA November 1999 normal left ventricular ejection fraction   Wears glasses    Wears hearing aid    B/L    Past Surgical History:  Procedure Laterality Date   A/V FISTULAGRAM Left 04/30/2019   Procedure: A/V FISTULAGRAM;  Surgeon: Elam Dutch, MD;  Location: [facility];  Service: Cardiovascular;  Laterality: Left;   A/V FISTULAGRAM Left 09/22/2019   Procedure: A/V FISTULAGRAM;  Surgeon: Marty Heck, MD;  Location: [facility];  Service: Cardiovascular;  Laterality: Left;   ABDOMINAL SURGERY     APPENDECTOMY     AV FISTULA  PLACEMENT Right 03/11/2018   Procedure: CREATION Brachiocephalic Fistula RIGHT ARM;  Surgeon: Rosetta Posner, MD;  Location: [place] [facility];  Service: Vascular;  Laterality: Right;   AV FISTULA PLACEMENT Left 05/06/2019   Procedure: LEFT BRACHIOCEPHALIC ARTERIOVENOUS (AV) FISTULA CREATION;  Surgeon: Marty Heck, MD;  Location: [place] [facility];  Service: Vascular;  Laterality: Left;   BACK SURGERY     x3; cages in patient's back since 2000   BIOPSY  11/12/2018   Procedure: BIOPSY;  Surgeon: Laurence Spates, MD;  [facility];  Service: Endoscopy;;   CARDIAC CATHETERIZATION     stent   CHOLECYSTECTOMY     CLOSED REDUCTION SHOULDER DISLOCATION     COLONOSCOPY W/ BIOPSIES AND POLYPECTOMY     COLONOSCOPY WITH PROPOFOL N/A 11/12/2018   Procedure: COLONOSCOPY WITH PROPOFOL;  Surgeon: Laurence Spates, MD;  [facility];  Service: Endoscopy;  Laterality: N/A;   COLONOSCOPY WITH PROPOFOL N/A 10/02/2021   Procedure: COLONOSCOPY WITH PROPOFOL;  Surgeon: Clarene Essex, MD;  [facility];  Service: Gastroenterology;  Laterality: N/A;   CORONARY ANGIOPLASTY     CYSTOSCOPY WITH INSERTION OF UROLIFT N/A 11/06/2020   Procedure: CYSTOSCOPY WITH INSERTION OF UROLIFT;  Surgeon: Cleon Gustin, MD;  [facility];  Service: Urology;  Laterality: N/A;   diabetic ulcers     DIAGNOSTIC LAPAROSCOPY     DIALYSIS/PERMA CATHETER REMOVAL N/A 09/22/2019   Procedure: DIALYSIS/PERMA CATHETER REMOVAL;  Surgeon: Marty Heck, MD;  Location: [facility];  Service: Cardiovascular;  Laterality: N/A;   ESOPHAGOGASTRODUODENOSCOPY (EGD) WITH PROPOFOL N/A 11/12/2018   Procedure: ESOPHAGOGASTRODUODENOSCOPY (EGD) WITH PROPOFOL;  Surgeon: Laurence Spates, MD;  [facility];  Service: Endoscopy;  Laterality: N/A;   GROIN EXPLORATION     HEMOSTASIS CLIP PLACEMENT  11/12/2018   Procedure: HEMOSTASIS CLIP PLACEMENT;  Surgeon: Laurence Spates, MD;  [facility];  Service: Endoscopy;;    HIP ARTHROPLASTY Left    gamma nail   INSERT / REPLACE / [place] (AV) ARTEGRAFT ARM Left 03/18/2018   Procedure: INSERTION OF ARTERIOVENOUS (AV) FISTULA;  Surgeon: Rosetta Posner, MD;  Location: [place];  Service: Vascular;  Laterality: Left;   IR FLUORO GUIDE CV LINE RIGHT  04/11/2019   IR US GUIDE VASC ACCESS RIGHT  04/11/2019   LEFT HEART CATH AND CORONARY ANGIOGRAPHY N/A 07/23/2019   Procedure: LEFT HEART CATH AND CORONARY ANGIOGRAPHY;  Surgeon: Martinique, Peter M, MD;  Location: [facility];  Service: Cardiovascular;  Laterality: N/A;   LEFT HEART CATH AND CORONARY ANGIOGRAPHY N/A 10/10/2021   Procedure: LEFT HEART CATH AND CORONARY ANGIOGRAPHY;  Surgeon: [name],  MD;  Location: [facility];  Service: Cardiovascular;  Laterality: N/A;   LIGATION ARTERIOVENOUS GORTEX GRAFT Right 03/18/2018   Procedure: LIGATION ARTERIOVENOUS FISTULA;  Surgeon: Rosetta Posner, MD;  Location: [facility];  Service: Vascular;  Laterality: Right;   OSTECTOMY Left 04/23/2017   Procedure: OSTECTOMY LEFT GREAT TOE;  Surgeon: [name], DPM;  [facility];  Service: Podiatry;  Laterality: Left;  left great toe   POLYPECTOMY  11/12/2018   Procedure: POLYPECTOMY;  Surgeon: Laurence Spates, MD;  [facility];  Service: Endoscopy;;   POLYPECTOMY  10/02/2021   Procedure: POLYPECTOMY;  Surgeon: Clarene Essex, MD;  Location: [facility];  Service: Gastroenterology;;   SPINAL CORD STIMULATOR INSERTION N/A 12/20/2020   Procedure: Permanent Spinal Cord Stimulator  Lead Implant with Fluoroscopy and Battery Implant;  Surgeon: Reece Agar, MD;  Location: [facility];  Service: Neurosurgery;  Laterality: N/A;   TUMOR REMOVAL     small intestine x3   VISCERAL ANGIOGRAPHY N/A 08/02/2019   Procedure: VISCERAL ANGIOGRAPHY;  Surgeon: Waynetta Sandy, MD;  Location: [facility];  Service: Cardiovascular;  Laterality: N/A;     Home Medications:   Prior to Admission medications   Medication Sig Start Date End Date Taking? Authorizing Provider  acetaminophen (TYLENOL) 325 MG tablet Take 650 mg by mouth every 6 (six) hours as needed for mild pain or moderate pain.    [provider]  albuterol (VENTOLIN HFA) 108 (90 Base) MCG/ACT inhaler Inhale 2 puffs into the lungs every 6 (six) hours as needed for wheezing or shortness of breath.     [provider]  amLODipine (NORVASC) 5 MG tablet Take 5 mg by mouth daily.    [provider]  aspirin EC 81 MG EC tablet Take 1 tablet (81 mg total) by mouth daily. 07/25/19   Isaac Bliss, Rayford Halsted, MD  atorvastatin (LIPITOR) 40 MG tablet Take 1 tablet by mouth once daily 03/11/22   Arnoldo Lenis, MD  beclomethasone (QVAR) 80 MCG/ACT inhaler Inhale 2 puffs into the lungs daily as needed (wheezing and shortness of breath).    [provider]  buPROPion (WELLBUTRIN XL) 150 MG 24 hr tablet Take 150 mg by mouth daily. Patient not taking: Reported on 04/10/2022 01/01/22   [provider]  CALCIUM CITRATE PO Take 600 mg by mouth in the morning and at bedtime.    [provider]  carvedilol (COREG) 25 MG tablet Take 1 tablet (25 mg total) by mouth 2 (two) times daily with a meal. 09/13/21   Branch, Alphonse Guild, MD  Cholecalciferol (VITAMIN D3) 2000 units TABS Take 2,000 Units by mouth daily.     [provider]  clobetasol (TEMOVATE) 0.05 % external solution Apply 1 application topically 2 (two) times daily as needed (itching).    [provider]  denosumab (PROLIA) 60 MG/ML SOSY injection Inject 60 mg into the skin every 6 (six) months.    [provider]  gentamicin cream (GARAMYCIN) 0.1 % Apply 1 Application topically daily. 04/15/22   Idamae Schuller, MD  multivitamin (RENA-VIT) TABS tablet Take 1 tablet by mouth daily.    [provider]  nitroGLYCERIN (NITROSTAT) 0.4 MG SL tablet Place 1 tablet (0.4 mg total) under the  tongue every 5 (five) minutes as needed for chest pain. 10/22/21   Arnoldo Lenis, MD  pantoprazole (PROTONIX) 40 MG tablet Take 1 tablet (40 mg total) by mouth daily. 10/11/21 10/11/22  Darliss Cheney, MD  PLAVIX 75 MG tablet Take 75 mg by mouth daily.    [provider]  polyethylene glycol (MIRALAX / GLYCOLAX) 17 g packet Take 17 g by mouth daily as needed for mild constipation or moderate constipation.    [provider]  potassium chloride SA (KLOR-CON M) 20 MEQ tablet Take 20 mEq by mouth every other day. 01/23/22   [provider]  risperiDONE (RISPERDAL) 0.5 MG tablet Take 0.5 mg by mouth at bedtime.    [provider]  rOPINIRole (REQUIP) 1 MG tablet Take 1 mg by mouth daily. 03/02/21   [provider]  sertraline (ZOLOFT) 25 MG tablet Take 25 mg by mouth in the morning. 09/22/20   [provider]  traMADol (ULTRAM) 50 MG tablet Take 50 mg by mouth every 4 (four) hours as needed for moderate pain. Patient not taking: Reported on 04/10/2022 09/13/21   [provider]    Inpatient Medications: Scheduled Meds:  amLODipine  5 mg Oral Daily   aspirin EC  81 mg Oral Daily   atorvastatin  40 mg Oral Daily   budesonide  2 mL Inhalation BID   calcium citrate  200 mg of elemental calcium Oral Daily   carvedilol  25 mg Oral BID WC   clopidogrel  75 mg Oral Daily   insulin aspart  0-15 Units Subcutaneous TID WC   insulin aspart  0-5 Units Subcutaneous QHS   multivitamin  1 tablet Oral Daily   nitroGLYCERIN  0.4 mg Transdermal Daily   nitroGLYCERIN       pantoprazole  40 mg Oral Daily   risperiDONE  0.5 mg Oral QHS   rOPINIRole  1 mg Oral Daily   sertraline  25 mg Oral Daily   Continuous Infusions:  heparin 1,600 Units/hr (07/07/22 2200)   PRN Meds: acetaminophen, albuterol, morphine injection, nitroGLYCERIN, ondansetron (ZOFRAN) IV  Allergies:    Allergies  Allergen Reactions   Hydrocodone Itching    Hydrocodone-Acetaminophen Itching   Other     Opioids cause hallucinations and delusions has to be given risperidone to settle down Other reaction(s): hallucinations/delirium  Opiates (Uncoded) Reaction:  Serotonin Syndrome     Oxycodone-Acetaminophen Other (See Comments)    Social History:   Social History   Socioeconomic History   Marital status: Married    Spouse name: Not on file   Number of children: 2   Years of education: Not on file   Highest education level: Not on file  Occupational History   Occupation: retired Engineer, structural  Tobacco Use   Smoking status: Former    Years: 40.00    Types: Cigarettes    Start date: 05/20/1968    Quit date: 11/30/2019    Years since quitting: 2.6   Smokeless tobacco: Never  Vaping Use   Vaping Use: Never used  Substance and Sexual Activity   Alcohol use: Not Currently    Alcohol/week: 0.0 standard drinks of alcohol    Comment: rare   Drug use: No   Sexual activity: Yes  Other Topics Concern   Not on file  Social History Narrative   Patient continues to smoke.    Social Determinants of Health   Financial Resource Strain: Not on file  Food Insecurity: Not on file  Transportation Needs: Not on file  Physical Activity: Not on file  Stress: Not on file  Social Connections: Not on file  Intimate Partner Violence: Not on file    Family History:   Family History  Problem Relation Age of Onset   Diabetes Mother    Alcohol abuse Father      ROS:  Please see the history of present illness.  All other ROS reviewed and negative.     Physical Exam/Data:   Vitals:   07/08/22 0041 07/08/22 0100 07/08/22 0130 07/08/22 0200  BP: (!) 146/95 (!) 148/85 (!) 148/64 (!) 147/70  Pulse:      Resp: (!) 22 18 20 20  $ Temp:      TempSrc:      SpO2: 92% 94% 98% 97%  Weight:      Height:       No intake or output data in the 24 hours ending 07/08/22 0213    07/07/2022   11:35 PM 07/07/2022    4:31 PM 04/14/2022    7:45 AM   Last 3 Weights  Weight (lbs) 284 lb 2.8 oz 269 lb 263 lb 7.2 oz  Weight (kg) 128.9 kg 122.018 kg 119.5 kg     Body mass index is 34.59 kg/m.  General:  Well nourished, well developed, in no acute distress HEENT: normal Neck: no JVD Vascular: No carotid bruits; Distal pulses 2+ bilaterally Cardiac:  normal S1, S2; RRR; no murmur  Lungs:   Diffuse expiratory wheezing on exam Abd: soft, nontender, no hepatomegaly  Ext: no edema Musculoskeletal:  No deformities, BUE and BLE strength normal and equal Skin: warm and dry  Neuro:  CNs 2-12 intact, no focal abnormalities noted Psych:  Normal affect   EKG:  The EKG was personally reviewed and demonstrates:  V paced rhythm  Relevant CV Studies:  Laboratory Data:  High Sensitivity Troponin:   Recent Labs  Lab 07/07/22 1704 07/07/22 1849 07/08/22 0040  TROPONINIHS 41* 41* 53*     Chemistry Recent Labs  Lab 07/07/22 1704 07/08/22 0040  NA 122* 126*  K 3.4* 4.1  CL 88* 89*  CO2 22 23  GLUCOSE 307* 279*  BUN 62* 67*  CREATININE 5.26* 5.40*  CALCIUM 8.9 9.2  MG  --  2.3  GFRNONAA 11* 11*  ANIONGAP 12 14    Recent Labs  Lab 07/07/22 1704 07/08/22 0040  PROT 6.7 6.6  ALBUMIN 2.9* 2.8*  AST 22 25  ALT 16 16  ALKPHOS 99 111  BILITOT 0.2* 0.6   Lipids No results for input(s): "CHOL", "TRIG", "HDL", "LABVLDL", "LDLCALC", "CHOLHDL" in the last 168 hours.  Hematology Recent Labs  Lab 07/07/22 1704 07/08/22 0040  WBC 13.8* 15.1*  RBC 3.33* 3.25*  HGB 11.7* 11.6*  HCT 34.4* 33.0*  MCV 103.3* 101.5*  MCH 35.1* 35.7*  MCHC 34.0 35.2  RDW 13.7 13.7  PLT 175 176   Thyroid No results for input(s): "TSH", "FREET4" in the last 168 hours.  BNPNo results for input(s): "BNP", "PROBNP" in the last 168 hours.  DDimer No results for input(s): "DDIMER" in the last 168 hours.   Radiology/Studies:  DG Chest Port 1 View  Result Date: 07/07/2022 CLINICAL DATA:  72 year old male with history of chest pain and weakness. EXAM:  PORTABLE CHEST 1 VIEW COMPARISON:  Chest x-ray 04/09/2022. FINDINGS: Lung volumes are normal. No consolidative airspace disease. No pleural effusions. No pneumothorax. Cephalization of the pulmonary vasculature, without frank pulmonary edema. Mild cardiomegaly. Upper mediastinal contours are within normal limits. Atherosclerotic calcifications are noted in the thoracic aorta. Left-sided pacemaker device in place with lead tips projecting over the expected location of the right atrium and right ventricle. IMPRESSION: 1. Cardiomegaly with pulmonary venous congestion,  but no frank pulmonary edema. 2. Aortic atherosclerosis. Electronically Signed   By: Vinnie Langton M.D.   On: 07/07/2022 17:40     Assessment and Plan:   Mr Palomares is a 72 year old with PMHx of CAD s/p remote PCI and a recent LHC from 2023 with occluded Lcx and a sub branch of the first diagonal branch failed to be recanalized. He did well with medical therapy for some time and now comes with symptoms suggestive of unstable angina. His ECG is V paced and his troponis are flat which speaks against acute myaocardial injury. I do believe that from a troponin standpoint this is a chronic elevation from his ESRD. His symptoms however are concerning and he had extensive disease in the past. His medical therapy is limited. He is off Imdur because of HA but after talking to him he is willing to try it again. Ranexa was attempted in the past but stopped due to insurance issues. He is only on BB and low dose CCB. As far as his coronary anatomy goes, he has several lesions that could undergo further interrogation with iFR (mLAD and RCA). Because of this reason I would favor to optimize his medical therapy and re look at his coronary anatomy. 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 Additionally he does not have weight loss. The intermittent nature goes against  acute mesenteric ischemia. He denies abdominal pain on exam.  Unstable angina - on DAPT at home Coronary artery disease Afib/flutter - chadsvasc2 not on anticoagulation due to issues with anemia in the past. Last EGD/colon on 10/2018. Non bleeding AVMs and small polyps found in colonoscopy. Polyp noted on EGD as well. No bleeding since then and Hb has remained stable (even with chronic DAPT at home)  Symptomatic bradycardia s/p PPM HFpEF ESRD on PD DM  Plan: - ASA 81 mg daily and heparin gtt - Continue Coreg 25 mg BID. Increase amlodipine to 10 mg daily - Add Imdur 30 mg daily - Can inquire with case manager again about ranexa coverage - Keep patient NPO for possible LHC tomorrow - Device interrogation in am as episodes of tachyarrhythmia could also explain his symptoms - Rest per primary team   Risk Assessment/Risk Scores:     TIMI Risk Score for Unstable Angina or Non-ST Elevation MI:   The patient's TIMI risk score is  , which indicates a  % risk of all cause mortality, new or recurrent myocardial infarction or need for urgent revascularization in the next 14 days.    CHA2DS2-VASc Score = 2   This indicates a 2.2% annual risk of stroke. The patient's score is based upon: CHF History: 0 HTN History: 0 Diabetes History: 0 Stroke History: 0 Vascular Disease History: 1 Age Score: 1 Gender Score: 0         For questions or updates, please contact [place] Please consult [email or web address] for contact info under    Signed, Ewing Schlein, MD  07/08/2022 2:13 AM

## 2022-07-08 NOTE — TOC Progression Note (Addendum)
Transition of Care [facility]) - Progression Note    Patient Details  Name: [name] MRN: [id number] Date of Birth: 05/18/1951  Transition of Care [facility] [place]) CM/SW Contact  Zenon Mayo, RN Phone Number: 07/08/2022, 12:16 PM  Clinical Narrative:     from home , chest pain, HF, CAD, has pace maker, conts on hep drip, may have cardiac cath today.  TOC following.         Expected Discharge Plan and Services                                               Social Determinants of Health (SDOH) Interventions SDOH Screenings   Depression (PHQ2-9): Medium Risk (04/03/2020)  Tobacco Use: Medium Risk (07/07/2022)    Readmission Risk Interventions     No data to display

## 2022-07-08 NOTE — Significant Event (Signed)
New admit arrived from [facility] via [place]. Pt reports pain in chest 1/10 on arrival. Heparin infusing at 1600u/hr.   Placed on telemetry- ventricular paced with HR fluctuating from 70s-120s. Vitals obtained and pt assessed.  Shortly after RN left room, pt called reporting CP increased to 10/10 sharp in center of chest.  Night hospitalist and cardiology paged. Charge RN notified and rapid RN called. EKG obtained.   Nitro sublingual given x 3 per protocol: CP 10/10; 1 nitro given and CP decreased to 8/10 then went back up to 9/10.  2nd nitro given, pain decreased to 7 then back up to 9/10.  3rd nitro given, pain decreased to 5-7/10.     Contacted lab and IV team for labs and 2nd IV placement. Pain slowly decreased to 3/10. Cardiologist at bedside. Nitropatch placed per order with plan for PRN morphine if pain increases per MD.

## 2022-07-08 NOTE — Progress Notes (Signed)
MEWS Progress Note  Patient Details Name: [name] MRN: [id number] DOB: Dec 03, 1950 Today's Date: 07/08/2022   MEWS Flowsheet Documentation:  Assess: MEWS Score Temp: 98 F (36.7 C) BP: (!) 149/74 MAP (mmHg): 96 Pulse Rate: 70 ECG Heart Rate: 66 Resp: 17 Level of Consciousness: Alert SpO2: 99 % O2 Device: Nasal Cannula Patient Activity (if Appropriate): In bed O2 Flow Rate (L/min): 4 L/min Assess: MEWS Score MEWS Temp: 0 MEWS Systolic: 0 MEWS Pulse: 0 MEWS RR: 0 MEWS LOC: 0 MEWS Score: 0 MEWS Score Color: Green Assess: SIRS CRITERIA SIRS Temperature : 0 SIRS Respirations : 0 SIRS Pulse: 0 SIRS WBC: 1 SIRS Score Sum : 1 SIRS Temperature : 0 SIRS Pulse: 0 SIRS Respirations : 0 SIRS WBC: 1 SIRS Score Sum : 1 Assess: if the MEWS score is Yellow or Red Were vital signs taken at a resting state?: Yes Focused Assessment: Change from prior assessment (see assessment flowsheet) (increased cp) Does the patient meet 2 or more of the SIRS criteria?: Yes Does the patient have a confirmed or suspected source of infection?: No MEWS guidelines implemented : Yes, yellow Treat MEWS Interventions: Considered administering scheduled or prn medications/treatments as ordered Take Vital Signs Increase Vital Sign Frequency : Yellow: Q2hr x1, continue Q4hrs until patient remains green for 12hrs Escalate MEWS: Escalate: Yellow: Discuss with charge nurse and consider notifying provider and/or RRT Notify: Charge Nurse/RN Name of Charge Nurse/RN Notified: Social research officer, government Provider Notification Provider Name/Title: Dr. Marlowe Sax Date Provider Notified: 07/08/22 Time Provider Notified: [id number] (paged via amion at [place] then via phone at [phone]) Method of Notification: Page Notification Reason: Change in status (chest pain 10/10, HR 120s. new admit from [place]. getting EKG.) Provider response: At bedside, See new orders Date of Provider Response: 07/08/22 Time of Provider Response:  [id number] Notify: Rapid Response Name of Rapid Response RN Notified: Mindy, RN Date Rapid Response Notified: 07/08/22 Time Rapid Response Notified: [id number]      Frederico Hamman 07/08/2022, 7:46 AM

## 2022-07-08 NOTE — Interval H&P Note (Signed)
History and Physical Interval Note:  07/08/2022 9:22 AM  [name]  has presented today for surgery, with the diagnosis of unstable angina.  The various methods of treatment have been discussed with the patient and family. After consideration of risks, benefits and other options for treatment, the patient has consented to  Procedure(s): LEFT HEART CATH AND CORONARY ANGIOGRAPHY (N/A) as a surgical intervention.  The patient's history has been reviewed, patient examined, no change in status, stable for surgery.  I have reviewed the patient's chart and labs.  Questions were answered to the patient's satisfaction.     [name]

## 2022-07-08 NOTE — H&P (Signed)
History and Physical    Patient: [name] [id number] DOB: 05/17/1951 DOA: 07/07/2022 DOS: the patient was seen and examined on 07/08/2022 PCP: Tobe Sos, MD  Patient coming from: Home  Chief Complaint:  Chief Complaint  Patient presents with   Chest Pain   HPI: [name] is a 72 y.o. male with medical history significant of coronary artery disease, AAA, aortic atherosclerosis, atrial flutter, coronary artery disease, CHF, CKD, COPD, ESRD on PD, diabetes mellitus type 2, hyperlipidemia, hypertension, GERD, and more presents to ED with a chief complaint of chest pain.  Patient reports that he has had chest pain intermittently for 2 weeks.  Got significantly worse last night.  Patient reports that the pain is relieved temporarily by nitro.  His pain started this time at 3 AM, he took 2 nitro then and the pain was temporarily relieved.  He took 2 more nitro at breakfast for the same pain.  He took 2 more nitro at lunch for the same pain.  For total of 6 nitro prior to arrival.  When he arrived here he took another nitro.  Patient reports that on arrival his pain was 7 out of 10.  After nitro it was 3 out of 10.  Pain happens at rest.  He has associated dyspnea, nausea.  He had 1 episode of emesis 2 days ago.  He does not have diaphoresis but does admit to some lightheadedness and palpitations.  Feels the same as when he had a heart attack in the past.  He reports that the pain is no worse with deep breath he reports no cough, but he coughs in the room-likely his normal cough.  He denies fever.  He did have some abdominal pain earlier today.  It was periumbilical.  He describes it as feeling like she was lightly punched in the stomach.  Eating no worse or better.  Having BM seem to relieve the pain.  He had some loose stools today, but not diarrhea.  Patient does have cardiac history with his most recent cardiac cath being in 2021.  He had significant disease there was an attempt  to place a stent that was not successful, so medical management was optimized with aspirin, statin, beta-blocker, Plavix.  He has not been on Imdur or Ranexa.  Patient has no further complaints at this time.  Patient does not smoke, does not drink.  He is vaccinated for COVID.  Patient is DNR, reporting that if his heart were to stop beating he would want to have a natural death without CPR or life support. Review of Systems: As mentioned in the history of present illness. All other systems reviewed and are negative. Past Medical History:  Diagnosis Date   AAA (abdominal aortic aneurysm) ([place]) 06/2016   Mild increase in size of 3.4 cm infrarenal abdominal aortic aneurysm   Anemia    Aortic atherosclerosis (HCC)    Arthritis    Asthma    Atrial flutter (HCC)    AVF (arteriovenous fistula) (HCC)    Left forearm   CAD (coronary artery disease)    a. s/p prior stenting of RCA in 1999 b. cath in 2003 showing moderate CAD c. NST in 2016 showing small area of ischemic and low-risk   CHF (congestive heart failure) ([place])    CKD (chronic kidney disease), stage V ([place])    kidney transplant evaluation   COPD (chronic obstructive pulmonary disease) ([place])    with ongoing tobacco use and patient failed  sham takes   Diverticulosis 2018   Mild sigmoid colon diverticulosis.   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    Reflux esophagitis    hx   Two-vessel coronary artery disease    moderate.  by cath in 2003. Status post stenting of the mdi RCA November 1999 normal left ventricular ejection fraction   Wears glasses    Wears hearing aid    B/L   Past Surgical History:  Procedure Laterality Date   A/V FISTULAGRAM Left 04/30/2019   Procedure: A/V FISTULAGRAM;  Surgeon: Elam Dutch, MD;  Location: [facility] [facility];  Service: Cardiovascular;  Laterality: Left;   A/V FISTULAGRAM Left 09/22/2019   Procedure: A/V FISTULAGRAM;  Surgeon: Marty Heck, MD;  Location: [place] [facility];  Service: Cardiovascular;  Laterality: Left;   ABDOMINAL SURGERY     APPENDECTOMY     AV FISTULA PLACEMENT Right 03/11/2018   Procedure: CREATION Brachiocephalic Fistula RIGHT ARM;  Surgeon: Rosetta Posner, MD;  Location: [facility];  Service: Vascular;  Laterality: Right;   AV FISTULA PLACEMENT Left 05/06/2019   Procedure: LEFT BRACHIOCEPHALIC ARTERIOVENOUS (AV) FISTULA CREATION;  Surgeon: Marty Heck, MD;  Location: [facility];  Service: Vascular;  Laterality: Left;   BACK SURGERY     x3; cages in patient's back since 2000   BIOPSY  11/12/2018   Procedure: BIOPSY;  Surgeon: Laurence Spates, MD;  Location: [facility];  Service: Endoscopy;;   CARDIAC CATHETERIZATION     stent   CHOLECYSTECTOMY     CLOSED REDUCTION SHOULDER DISLOCATION     COLONOSCOPY W/ BIOPSIES AND POLYPECTOMY     COLONOSCOPY WITH PROPOFOL N/A 11/12/2018   Procedure: COLONOSCOPY WITH PROPOFOL;  Surgeon: Laurence Spates, MD;  [facility]: [facility];  Service: Endoscopy;  Laterality: N/A;   COLONOSCOPY WITH PROPOFOL N/A 10/02/2021   Procedure: COLONOSCOPY WITH PROPOFOL;  Surgeon: Clarene Essex, MD;  Location: [facility];  Service: Gastroenterology;  Laterality: N/A;   CORONARY ANGIOPLASTY     CYSTOSCOPY WITH INSERTION OF UROLIFT N/A 11/06/2020   Procedure: CYSTOSCOPY WITH INSERTION OF UROLIFT;  Surgeon: Cleon Gustin, MD;  Location: [facility];  Service: Urology;  Laterality: N/A;   diabetic ulcers     DIAGNOSTIC  LAPAROSCOPY     DIALYSIS/PERMA CATHETER REMOVAL N/A 09/22/2019   Procedure: DIALYSIS/PERMA CATHETER REMOVAL;  Surgeon: Marty Heck, MD;  Location: [place] [facility] [facility];  Service: Cardiovascular;  Laterality: N/A;   ESOPHAGOGASTRODUODENOSCOPY (EGD) WITH PROPOFOL N/A 11/12/2018   Procedure: ESOPHAGOGASTRODUODENOSCOPY (EGD) WITH PROPOFOL;  Surgeon: Laurence Spates, MD;  [facility];  Service: Endoscopy;  Laterality: N/A;   GROIN EXPLORATION     HEMOSTASIS CLIP PLACEMENT  11/12/2018   Procedure: HEMOSTASIS CLIP PLACEMENT;  Surgeon: Laurence Spates, MD;  [facility];  Service: Endoscopy;;   HIP ARTHROPLASTY Left    gamma nail   INSERT /  REPLACE / REMOVE PACEMAKER     INSERTION OF ARTERIOVENOUS (AV) ARTEGRAFT ARM Left 03/18/2018   Procedure: INSERTION OF ARTERIOVENOUS (AV) FISTULA;  Surgeon: Rosetta Posner, MD;  Location: [facility];  Service: Vascular;  Laterality: Left;   IR FLUORO GUIDE CV LINE RIGHT  04/11/2019   IR US GUIDE VASC ACCESS RIGHT  04/11/2019   LEFT HEART CATH AND CORONARY ANGIOGRAPHY N/A 07/23/2019   Procedure: LEFT HEART CATH AND CORONARY ANGIOGRAPHY;  Surgeon: Martinique, Peter M, MD;  Location: [place] [facility];  Service: Cardiovascular;  Laterality: N/A;   LEFT HEART CATH AND CORONARY ANGIOGRAPHY N/A 10/10/2021   Procedure: LEFT HEART CATH AND CORONARY ANGIOGRAPHY;  Surgeon: [name], MD;  Location: [place] [facility];  Service: Cardiovascular;  Laterality: N/A;   LIGATION ARTERIOVENOUS GORTEX GRAFT Right 03/18/2018   Procedure: LIGATION ARTERIOVENOUS FISTULA;  Surgeon: Rosetta Posner, MD;  Location: [facility];  Service: Vascular;  Laterality: Right;   OSTECTOMY Left 04/23/2017   Procedure: OSTECTOMY LEFT GREAT TOE;  Surgeon: [name], DPM;  Location: [facility];  Service: Podiatry;  Laterality: Left;  left great toe   POLYPECTOMY  11/12/2018   Procedure: POLYPECTOMY;  Surgeon: Laurence Spates, MD;  [facility];  Service: Endoscopy;;    POLYPECTOMY  10/02/2021   Procedure: POLYPECTOMY;  Surgeon: Clarene Essex, MD;  Location: [facility];  Service: Gastroenterology;;   SPINAL CORD STIMULATOR INSERTION N/A 12/20/2020   Procedure: Permanent Spinal Cord Stimulator  Lead Implant with Fluoroscopy and Battery Implant;  Surgeon: Reece Agar, MD;  Location: [place];  Service: Neurosurgery;  Laterality: N/A;   TUMOR REMOVAL     small intestine x3   VISCERAL ANGIOGRAPHY N/A 08/02/2019   Procedure: VISCERAL ANGIOGRAPHY;  Surgeon: Waynetta Sandy, MD;  Location: [place] [facility];  Service: Cardiovascular;  Laterality: N/A;   Social History:  reports that he quit smoking about 2 years ago. His smoking use included cigarettes. He started smoking about 54 years ago. He has never used smokeless tobacco. He reports that he does not currently use alcohol. He reports that he does not use drugs.  Allergies  Allergen Reactions   Hydrocodone Itching   Hydrocodone-Acetaminophen Itching   Other     Opioids cause hallucinations and delusions has to be given risperidone to settle down Other reaction(s): hallucinations/delirium  Opiates (Uncoded) Reaction:  Serotonin Syndrome     Oxycodone-Acetaminophen Other (See Comments)    Family History  Problem Relation Age of Onset   Diabetes Mother    Alcohol abuse Father     Prior to Admission medications   Medication Sig Start Date End Date Taking? Authorizing Provider  acetaminophen (TYLENOL) 325 MG tablet Take 650 mg by mouth every 6 (six) hours as needed for mild pain or moderate pain.    [provider]  albuterol (VENTOLIN HFA) 108 (90 Base) MCG/ACT inhaler Inhale 2 puffs into the lungs every 6 (six) hours as needed for wheezing or shortness of breath.     [provider]  amLODipine (NORVASC) 5 MG tablet Take 5 mg by mouth daily.    [provider]  aspirin EC 81 MG EC tablet Take 1 tablet (81 mg total) by mouth daily. 07/25/19   Isaac Bliss, Rayford Halsted, MD  atorvastatin (LIPITOR) 40 MG tablet Take 1 tablet by mouth once daily 03/11/22   Arnoldo Lenis, MD  beclomethasone (QVAR) 80 MCG/ACT inhaler Inhale 2 puffs into the lungs daily as needed (wheezing and  shortness of breath).    [provider]  buPROPion (WELLBUTRIN XL) 150 MG 24 hr tablet Take 150 mg by mouth daily. Patient not taking: Reported on 04/10/2022 01/01/22   [provider]  CALCIUM CITRATE PO Take 600 mg by mouth in the morning and at bedtime.    [provider]  carvedilol (COREG) 25 MG tablet Take 1 tablet (25 mg total) by mouth 2 (two) times daily with a meal. 09/13/21   Branch, Alphonse Guild, MD  Cholecalciferol (VITAMIN D3) 2000 units TABS Take 2,000 Units by mouth daily.     [provider]  clobetasol (TEMOVATE) 0.05 % external solution Apply 1 application topically 2 (two) times daily as needed (itching).    [provider]  denosumab (PROLIA) 60 MG/ML SOSY injection Inject 60 mg into the skin every 6 (six) months.    [provider]  gentamicin cream (GARAMYCIN) 0.1 % Apply 1 Application topically daily. 04/15/22   Idamae Schuller, MD  multivitamin (RENA-VIT) TABS tablet Take 1 tablet by mouth daily.    [provider]  nitroGLYCERIN (NITROSTAT) 0.4 MG SL tablet Place 1 tablet (0.4 mg total) under the tongue every 5 (five) minutes as needed for chest pain. 10/22/21   Arnoldo Lenis, MD  pantoprazole (PROTONIX) 40 MG tablet Take 1 tablet (40 mg total) by mouth daily. 10/11/21 10/11/22  Darliss Cheney, MD  PLAVIX 75 MG tablet Take 75 mg by mouth daily.    [provider]  polyethylene glycol (MIRALAX / GLYCOLAX) 17 g packet Take 17 g by mouth daily as needed for mild constipation or moderate constipation.    [provider]  potassium chloride SA (KLOR-CON M) 20 MEQ tablet Take 20 mEq by mouth every other day. 01/23/22   [provider]  risperiDONE (RISPERDAL) 0.5 MG tablet Take 0.5 mg by  mouth at bedtime.    [provider]  rOPINIRole (REQUIP) 1 MG tablet Take 1 mg by mouth daily. 03/02/21   [provider]  sertraline (ZOLOFT) 25 MG tablet Take 25 mg by mouth in the morning. 09/22/20   [provider]  traMADol (ULTRAM) 50 MG tablet Take 50 mg by mouth every 4 (four) hours as needed for moderate pain. Patient not taking: Reported on 04/10/2022 09/13/21   [provider]    Physical Exam: Vitals:   07/08/22 0041 07/08/22 0100 07/08/22 0130 07/08/22 0200  BP: (!) 146/95 (!) 148/85 (!) 148/64 (!) 147/70  Pulse:      Resp: (!) 22 18 20 20  $ Temp:    (!) 97.5 F (36.4 C)  TempSrc:    Oral  SpO2: 92% 94% 98% 97%  Weight:      Height:       1.  General: Patient lying supine in bed,  no acute distress   2. Psychiatric: Alert and oriented x 3, mood and behavior normal for situation, pleasant and cooperative with exam   3. Neurologic: Speech and language are normal, face is symmetric, moves all 4 extremities voluntarily, at baseline without acute deficits on limited exam   4. HEENMT:  Head is atraumatic, normocephalic, pupils reactive to light, neck is supple, trachea is midline, mucous membranes are moist   5. Respiratory : Lungs are clear to auscultation bilaterally without wheezing, rhonchi, rales, no cyanosis, no increase in work of breathing or accessory muscle use   6. Cardiovascular : Heart rate normal, rhythm is regular, no murmurs, rubs or gallops, peripheral edema present, peripheral  pulses palpated   7. Gastrointestinal:  Abdomen is soft, nondistended, nontender to palpation bowel sounds active, no masses or organomegaly palpated   8. Skin:  Skin is warm, dry and intact without rashes, acute lesions, or ulcers on limited exam   9.Musculoskeletal:  No acute deformities or trauma, no asymmetry in tone, peripheral edema present, peripheral pulses palpated, no tenderness to palpation in the extremities  Data Reviewed: In  the ED Temp 97.7-97.8, heart rate 70, respiratory rate 18-20, blood pressure 131/60-145/85, satting 94-97% on 2 L nasal cannula Oxygen applied for comfort, no hypoxic events documented Leukocytosis at 13.8, hemoglobin 11.7, platelets 175 Chemistry reveals a hypokalemia at 3.4, hyponatremia 122, elevated BUN at 62, creatinine 5.26 in this peritoneal dialysis patient Glucose 307 [address]pe 41, 41 Chest x-ray shows cardiomegaly with venous congestion EKG shows a heart rate of 70, ventricular paced rhythm Cardiology was consulted Recommended starting heparin patient will be transferred to [facility] due to peritoneal dialysis Assessment and Plan: * Chest pain -Unstable angina -See plan for CAD -Trop stable 41, 41 -Inpatient consult to cards  Depression -Continue Bupropion and Zoloft  ESRD (end stage renal disease) ([place]) -Transfer to [facility] for peritoneal dialysis -Defer electrolyte management to neprho -Consult neprho - Dr. Jonnie Finner aware of patient -Continue to monitor  Chronic diastolic CHF (congestive heart failure) (HCC) -EF 55-60% 09/2021 -The LV has regional wall motion abnormalities  -Continue asa, statin, beta blocker, and plavix  Hyponatremia -Na 122 -defer to nephro  Diabetes mellitus with nephropathy (HCC) -Currently hyperglycemic at 307 -Sliding scale coverage -NPO after midnight in case of any procedures in the AM -Monitor CBGs  Hypokalemia -K+ 3.4 -Defer to nephro  CAD (coronary artery disease) - Continue asa, statin, beta blocker, plavix -Chest pain/Unstable angina today -7 nitro total today. Per Cardiology - start nitro drip if chest pain recurs -EKG without acute st changes -Monitor on tele -Continue heparin per cards recommendations to ED doc -Continue to monitor  COPD (chronic obstructive pulmonary disease) (HCC) -Continue albuterol inhaler and Qvar  -Clinically not in exacerbation at this time  Mixed hyperlipidemia -Continue statin      Advance  Care Planning:   Code Status: DNR  Consults: Cardiology  Family Communication: Wife at bedside  Severity of Illness: The appropriate patient status for this patient is OBSERVATION. Observation status is judged to be reasonable and necessary in order to provide the required intensity of service to ensure the patient's safety. The patient's presenting symptoms, physical exam findings, and initial radiographic and laboratory data in the context of their medical condition is felt to place them at decreased risk for further clinical deterioration. Furthermore, it is anticipated that the patient will be medically stable for discharge from the hospital within 2 midnights of admission.   Author: Rolla Plate, DO 07/08/2022 3:14 AM  For on call review [email or web address].

## 2022-07-09 ENCOUNTER — Encounter (HOSPITAL_COMMUNITY): Payer: Self-pay | Admitting: Internal Medicine

## 2022-07-09 DIAGNOSIS — I10 Essential (primary) hypertension: Secondary | ICD-10-CM | POA: Diagnosis not present

## 2022-07-09 DIAGNOSIS — I214 Non-ST elevation (NSTEMI) myocardial infarction: Secondary | ICD-10-CM

## 2022-07-09 DIAGNOSIS — N186 End stage renal disease: Secondary | ICD-10-CM | POA: Diagnosis not present

## 2022-07-09 LAB — CBC
HCT: 33.2 % — ABNORMAL LOW (ref 39.0–52.0)
Hemoglobin: 11 g/dL — ABNORMAL LOW (ref 13.0–17.0)
MCH: 34.7 pg — ABNORMAL HIGH (ref 26.0–34.0)
MCHC: 33.1 g/dL (ref 30.0–36.0)
MCV: 104.7 fL — ABNORMAL HIGH (ref 80.0–100.0)
Platelets: 174 10*3/uL (ref 150–400)
RBC: 3.17 MIL/uL — ABNORMAL LOW (ref 4.22–5.81)
RDW: 14 % (ref 11.5–15.5)
WBC: 12.9 10*3/uL — ABNORMAL HIGH (ref 4.0–10.5)
nRBC: 0 % (ref 0.0–0.2)

## 2022-07-09 LAB — GLUCOSE, CAPILLARY
Glucose-Capillary: 177 mg/dL — ABNORMAL HIGH (ref 70–99)
Glucose-Capillary: 247 mg/dL — ABNORMAL HIGH (ref 70–99)
Glucose-Capillary: 181 mg/dL — ABNORMAL HIGH (ref 70–99)
Glucose-Capillary: 152 mg/dL — ABNORMAL HIGH (ref 70–99)

## 2022-07-09 LAB — TROPONIN I (HIGH SENSITIVITY)
Troponin I (High Sensitivity): 2319 ng/L (ref <18)
Troponin I (High Sensitivity): 1796 ng/L (ref <18)

## 2022-07-09 MED ORDER — ENOXAPARIN SODIUM 30 MG/0.3ML IJ SOSY
30.0000 mg | PREFILLED_SYRINGE | INTRAMUSCULAR | Status: DC
  Administered 2022-07-09 – 2022-07-17 (×9): 30 mg via SUBCUTANEOUS
  Filled 2022-07-09 (×9): qty 0.3

## 2022-07-09 MED ORDER — DELFLEX-LC/2.5% DEXTROSE 394 MOSM/L IP SOLN: INTRAPERITONEAL | Status: DC

## 2022-07-09 MED ORDER — NITROGLYCERIN 0.4 MG SL SUBL
0.4000 mg | SUBLINGUAL_TABLET | SUBLINGUAL | Status: DC | PRN
  Administered 2022-07-09 – 2022-07-22 (×14): 0.4 mg via SUBLINGUAL
  Filled 2022-07-09 (×8): qty 1

## 2022-07-09 MED ORDER — ENOXAPARIN SODIUM 30 MG/0.3ML IJ SOSY: 30.0000 mg | PREFILLED_SYRINGE | INTRAMUSCULAR | Status: DC

## 2022-07-09 MED ORDER — NITROGLYCERIN IN D5W 200-5 MCG/ML-% IV SOLN: 0.0000 ug/min | INTRAVENOUS | Status: DC

## 2022-07-09 MED ORDER — ISOSORBIDE MONONITRATE ER 60 MG PO TB24
60.0000 mg | ORAL_TABLET | Freq: Every day | ORAL | Status: DC
  Administered 2022-07-09 – 2022-07-22 (×12): 60 mg via ORAL
  Filled 2022-07-09 (×12): qty 1

## 2022-07-09 MED ORDER — NITROGLYCERIN IN D5W 200-5 MCG/ML-% IV SOLN
INTRAVENOUS | Status: AC
  Administered 2022-07-09: 10 ug/min via INTRAVENOUS
  Filled 2022-07-09: qty 250

## 2022-07-09 NOTE — Progress Notes (Signed)
PROGRESS NOTE [name]  [id number] DOB: 1950-08-13 DOA: 07/07/2022 PCP: Tobe Sos, MD   Brief Narrative/Hospital Course: 72 y/o male with medical history of CAD, AAA, aortic atherosclerosis, atrial flutter, coronary disease, CHF, CKD, COPD, ESRD on peritoneal dialysis, diabetes mellitus type 2, hyperlipidemia, hypertension, GERD presented to ED with chief complaint of chest pain.  Patient reports that he has had chest pain intermittently for the past 2 weeks.  Patient has history of CAD, recent cath in 2021, he had significant disease so there was time to place a stent but was not successful so medical management was recommended with aspirin, statin, beta-blocker, Plavix. Patient was transferred to [facility], cardiology was consulted> underwent cardiac cath> showed  Largely unchanged coronary artery disease with chronic total occlusion of inferior branch of bifurcating diagonal and chronic total occlusion of the proximal left circumflex.Moderate in-stent restenosis of proximal right coronary artery stent with an associated RFR of 0.94. Moderate distal right coronary artery lesion with an RFR of 0.91> cardiology pursuing medical therapy    Subjective: Seen examined Wife at bedside Overnight afebrile, Was not followed nasal cannula oxygen saturation 89% this morning at 97% Labs reviewed troponin 2319 from 53, leukocytosis downtrending hemoglobin 9.0.  Cardiology was aware about elevated troponin PTH was 95 at [facility]- and advised to hold calcitriol.    Assessment and Plan: Principal Problem:   Chest pain Active Problems:   Mixed hyperlipidemia   COPD (chronic obstructive pulmonary disease) (HCC)   CAD (coronary artery disease)   Hypokalemia   Diabetes mellitus with nephropathy (HCC)   Hyponatremia   Chronic diastolic CHF (congestive heart failure) (HCC)   ESRD (end stage renal disease) (HCC)   Depression   Unstable angina (HCC)  NSTEMI Chest pain: S/p  cardiac cath> patient has occluded subbranch of the first diagonal, the LCx is occluded, there is moderate disease in the proximal and distal RCA but FFR was ok> there is obstructive disease in the diagonal but this is fairly small in caliber and severely calcified, no good targets for PCI, cardiology aware with ongoing chest pain and overnight with troponin bump to 2300 added Imdur, pursuing medical therapy with DAPT.  Patient wife understands. Wife mentioned if patient unable to get further treatment>she may pursue hospice- await further input from the cardiology.  Continue glipizide antihypertensives  A-fib/a flutter: Not on anticoagulation due to history of anemia in the past last EGD colonoscopy in June of/2022 nonbleeding AVMs and small polyps found on colonoscopy hemoglobin stable.  Plan per cardiology  HFREF EDP 18 mmHg volume status maintained with peritoneal dialysis continue same per nephrology  Symptomatic bradycardia status post pacemaker  ESRD on PD continue plan per nephrology Anemia of ESRD hemoglobin 11 to 10 g range no ESA needed, monitor H&H Metabolic bone disease of CKD: Calcium remains PhosLo added continue home VDRA  Hyponatremia on peritoneal dialysis monitor Diabetes mellitus with nephropathy continue sliding scale insulin Recent Labs  Lab 07/08/22 1219 07/08/22 1640 07/08/22 2135 07/09/22 0901 07/09/22 1104  GLUCAP 161* 231* 177* 247* 181*    COPD continue home inhaler, Qvar HLD continue statin  Class I Obesity:Patient's Body mass index is 33.84 kg/m. : Will benefit with PCP follow-up, weight loss  healthy lifestyle and outpatient sleep evaluation.  Goals of care currently DNR request palliative care evaluation await further input plan from cardiology  DVT prophylaxis: enoxaparin (LOVENOX) injection 30 mg Start: 07/09/22 1000 SCDs Start: 07/07/22 2346 Code Status:   Code Status: DNR Family Communication:  plan of care discussed with patient at bedside.  Wife  updated at the bedside Patient status is: Inpatient because of nstemi Level of care: Telemetry Cardiac   Dispo: The patient is from: HOME            Anticipated disposition:tbd  Objective: Vitals last 24 hrs: Vitals:   07/09/22 1030 07/09/22 1100 07/09/22 1130 07/09/22 1200  BP: (!) 112/53 (!) 111/59 (!) 116/59 104/60  Pulse:  62    Resp: (!) 26 19 18 $ (!) 21  Temp:  98.8 F (37.1 C)    TempSrc:  Oral    SpO2: 93% 96% 96% 98%  Weight:      Height:       Weight change: 4.082 kg  Physical Examination: General exam: alert awake, older than stated age HEENT:Oral mucosa moist, Ear/Nose WNL grossly Respiratory system: bilaterally clear BS, no use of accessory muscle Cardiovascular system: S1 & S2 +, No JVD. Gastrointestinal system: Abdomen soft,NT,ND, BS+ Nervous System:Alert, awake, moving extremities. Extremities: LE edema neg,distal peripheral pulses palpable.  Skin: No rashes,no icterus. MSK: Normal muscle bulk,tone, power  Medications reviewed:  Scheduled Meds:  amLODipine  5 mg Oral Daily   aspirin EC  81 mg Oral Daily   atorvastatin  40 mg Oral Daily   budesonide  2 mL Inhalation BID   calcium citrate  200 mg of elemental calcium Oral Daily   carvedilol  25 mg Oral BID WC   Chlorhexidine Gluconate Cloth  6 each Topical Daily   clopidogrel  75 mg Oral Daily   enoxaparin (LOVENOX) injection  30 mg Subcutaneous Q24H   gentamicin cream  1 Application Topical Daily   insulin aspart  0-15 Units Subcutaneous TID WC   insulin aspart  0-5 Units Subcutaneous QHS   isosorbide mononitrate  60 mg Oral Daily   multivitamin  1 tablet Oral Daily   pantoprazole  40 mg Oral Daily   risperiDONE  0.5 mg Oral QHS   rOPINIRole  1 mg Oral Daily   sertraline  25 mg Oral Daily   sodium chloride flush  3 mL Intravenous Q12H   Continuous Infusions:  sodium chloride      Diet Order             Diet renal with fluid restriction Fluid restriction: 1200 mL Fluid; Room service  appropriate? Yes; Fluid consistency: Thin  Diet effective now                  Intake/Output Summary (Last 24 hours) at 07/09/2022 1248 Last data filed at 07/09/2022 0536 Gross per 24 hour  Intake 300 ml  Output 800 ml  Net -500 ml   Net IO Since Admission: -500 mL [07/09/22 1248]  Wt Readings from Last 3 Encounters:  07/09/22 126.1 kg  04/14/22 119.5 kg  10/26/21 117.5 kg     Unresulted Labs (From admission, onward)     Start     Ordered   07/10/22 0500  Lipid panel  Tomorrow morning,   R       Question:  Specimen collection method  Answer:  Lab=Lab collect   07/09/22 0833   07/09/22 0500  CBC  Daily,   R     See Hyperspace for full Linked Orders Report.   07/08/22 0234           Data Reviewed: I have personally reviewed following labs and imaging studies CBC: Recent Labs  Lab 07/07/22 1704 07/08/22 0040 07/09/22 0055  WBC 13.8*  15.1* 12.9*  NEUTROABS 11.7*  --   --   HGB 11.7* 11.6* 11.0*  HCT 34.4* 33.0* 33.2*  MCV 103.3* 101.5* 104.7*  PLT 175 176 [id number]   Basic Metabolic Panel: Recent Labs  Lab 07/07/22 1704 07/08/22 0040  NA 122* 126*  K 3.4* 4.1  CL 88* 89*  CO2 22 23  GLUCOSE 307* 279*  BUN 62* 67*  CREATININE 5.26* 5.40*  CALCIUM 8.9 9.2  MG  --  2.3   GFR: Estimated Creatinine Clearance: 18.7 mL/min (A) (by C-G formula based on SCr of 5.4 mg/dL (H)). Liver Function Tests: Recent Labs  Lab 07/07/22 1704 07/08/22 0040  AST 22 25  ALT 16 16  ALKPHOS 99 111  BILITOT 0.2* 0.6  PROT 6.7 6.6  ALBUMIN 2.9* 2.8*   Recent Labs  Lab 07/08/22 1219 07/08/22 1640 07/08/22 2135 07/09/22 0901 07/09/22 1104  GLUCAP 161* 231* 177* 247* 181*    No results found for this or any previous visit (from the past 240 hour(s)).  Antimicrobials: Anti-infectives (From admission, onward)    None      Culture/Microbiology   Radiology Studies: CARDIAC CATHETERIZATION  Result Date: 07/08/2022   Mid LM to Dist LM lesion is 25% stenosed.   Mid  LAD lesion is 40% stenosed.   Ost Cx to Prox Cx lesion is 100% stenosed.   Prox RCA lesion is 50% stenosed.   Dist RCA lesion is 60% stenosed.   1st Diag-2 lesion is 100% stenosed.   1st Diag-1 lesion is 80% stenosed. 1.  Largely unchanged coronary artery disease with chronic total occlusion of inferior branch of bifurcating diagonal and chronic total occlusion of the proximal left circumflex.  2.  Moderate in-stent restenosis of proximal right coronary artery stent with an associated RFR of 0.94. 3.  Moderate distal right coronary artery lesion with an RFR of 0.91. 4.  LVEDP of 18 mmHg. Recommendation: The images and RFR data were reviewed with Dr. Martinique the team C attending; medical therapy will be pursued.   DG Chest Port 1 View  Result Date: 07/07/2022 CLINICAL DATA:  72 year old male with history of chest pain and weakness. EXAM: PORTABLE CHEST 1 VIEW COMPARISON:  Chest x-ray 04/09/2022. FINDINGS: Lung volumes are normal. No consolidative airspace disease. No pleural effusions. No pneumothorax. Cephalization of the pulmonary vasculature, without frank pulmonary edema. Mild cardiomegaly. Upper mediastinal contours are within normal limits. Atherosclerotic calcifications are noted in the thoracic aorta. Left-sided pacemaker device in place with lead tips projecting over the expected location of the right atrium and right ventricle. IMPRESSION: 1. Cardiomegaly with pulmonary venous congestion, but no frank pulmonary edema. 2. Aortic atherosclerosis. Electronically Signed   By: Vinnie Langton M.D.   On: 07/07/2022 17:40     LOS: 1 day   Antonieta Pert, MD [facility]  07/09/2022, 12:48 PM

## 2022-07-09 NOTE — Progress Notes (Signed)
Rathore, MD notified that patient complains of SOB, stomach, chest, and head pain. RT notified and breathing treatment given. Pt removed both PIVs. RN attempted to place PIV and it was unsuccessful. IV team consulted. EKG completed. Please see orders.

## 2022-07-09 NOTE — Hospital Course (Addendum)
72 y/o male with medical history of CAD, AAA, aortic atherosclerosis, atrial flutter, coronary disease, CHF, CKD, COPD, ESRD on peritoneal dialysis, diabetes mellitus type 2, hyperlipidemia, hypertension, GERD presented to ED with chief complaint of chest pain.  Patient reports that he has had chest pain intermittently for the past 2 weeks.  Patient has history of CAD, recent cath in 2021, he had significant disease so there was time to place a stent but was not successful so medical management was recommended with aspirin, statin, beta-blocker, Plavix. Patient was transferred to [facility], cardiology was consulted> underwent cardiac cath> showed  Largely unchanged coronary artery disease with chronic total occlusion of inferior branch of bifurcating diagonal and chronic total occlusion of the proximal left circumflex.Moderate in-stent restenosis of proximal right coronary artery stent with an associated RFR of 0.94. Moderate distal right coronary artery lesion with an RFR of 0.91> cardiology pursuing medical therapy

## 2022-07-09 NOTE — Progress Notes (Signed)
Rounding Note    Patient Name: [name] Date of Encounter: 07/09/2022  [place] Cardiologist: Carlyle Dolly, MD   Subjective   Patient had recurrent epigastric and chest pain this am similar to presenting symptoms. Some SOB. Chest pain resolved with sl Ntg. Troponin increased to 2300. Now pain free.   Inpatient Medications    Scheduled Meds:  amLODipine  5 mg Oral Daily   aspirin EC  81 mg Oral Daily   atorvastatin  40 mg Oral Daily   budesonide  2 mL Inhalation BID   calcitRIOL  0.25 mcg Oral Q M,W,F   calcium citrate  200 mg of elemental calcium Oral Daily   carvedilol  25 mg Oral BID WC   Chlorhexidine Gluconate Cloth  6 each Topical Daily   clopidogrel  75 mg Oral Daily   enoxaparin (LOVENOX) injection  30 mg Subcutaneous Q24H   gentamicin cream  1 Application Topical Daily   insulin aspart  0-15 Units Subcutaneous TID WC   insulin aspart  0-5 Units Subcutaneous QHS   isosorbide mononitrate  60 mg Oral Daily   multivitamin  1 tablet Oral Daily   pantoprazole  40 mg Oral Daily   risperiDONE  0.5 mg Oral QHS   rOPINIRole  1 mg Oral Daily   sertraline  25 mg Oral Daily   sodium chloride flush  3 mL Intravenous Q12H   Continuous Infusions:  sodium chloride     PRN Meds: sodium chloride, acetaminophen, acetaminophen, albuterol, melatonin, morphine injection, nitroGLYCERIN, ondansetron (ZOFRAN) IV, ondansetron (ZOFRAN) IV, sodium chloride flush   Vital Signs    Vitals:   07/09/22 0630 07/09/22 0634 07/09/22 0730 07/09/22 0847  BP: (!) 151/110 (!) 141/103 (!) 118/57   Pulse:   66   Resp: 20 (!) 24 (!) 21   Temp:   98.8 F (37.1 C)   TempSrc:   Oral   SpO2: (!) 88% 90% 97% 100%  Weight:      Height:        Intake/Output Summary (Last 24 hours) at 07/09/2022 0854 Last data filed at 07/09/2022 0536 Gross per 24 hour  Intake 300 ml  Output 800 ml  Net -500 ml      07/09/2022    4:53 AM 07/08/2022    4:00 AM 07/07/2022   11:35 PM   Last 3 Weights  Weight (lbs) 278 lb 284 lb 2.8 oz 284 lb 2.8 oz  Weight (kg) 126.1 kg 128.9 kg 128.9 kg      Telemetry    Afib with paced rhythm - Personally Reviewed  ECG    Afib with V pacing.  - Personally Reviewed  Physical Exam   GEN: morbidly obese Neck: mild JVD Cardiac: IRRR, no murmurs, rubs, or gallops. Radial site looks OK Respiratory: Clear to auscultation bilaterally. GI: Soft, nontender, non-distended  MS: No edema; No deformity. Neuro:  Nonfocal  Psych: Normal affect   Labs    High Sensitivity Troponin:   Recent Labs  Lab 07/07/22 1704 07/07/22 1849 07/08/22 0040 07/09/22 0055  TROPONINIHS 41* 41* 53* 2,319*     Chemistry Recent Labs  Lab 07/07/22 1704 07/08/22 0040  NA 122* 126*  K 3.4* 4.1  CL 88* 89*  CO2 22 23  GLUCOSE 307* 279*  BUN 62* 67*  CREATININE 5.26* 5.40*  CALCIUM 8.9 9.2  MG  --  2.3  PROT 6.7 6.6  ALBUMIN 2.9* 2.8*  AST 22 25  ALT 16 16  ALKPHOS 99 111  BILITOT 0.2* 0.6  GFRNONAA 11* 11*  ANIONGAP 12 14    Lipids No results for input(s): "CHOL", "TRIG", "HDL", "LABVLDL", "LDLCALC", "CHOLHDL" in the last 168 hours.  Hematology Recent Labs  Lab 07/07/22 1704 07/08/22 0040 07/09/22 0055  WBC 13.8* 15.1* 12.9*  RBC 3.33* 3.25* 3.17*  HGB 11.7* 11.6* 11.0*  HCT 34.4* 33.0* 33.2*  MCV 103.3* 101.5* 104.7*  MCH 35.1* 35.7* 34.7*  MCHC 34.0 35.2 33.1  RDW 13.7 13.7 14.0  PLT 175 176 174   Thyroid No results for input(s): "TSH", "FREET4" in the last 168 hours.  BNPNo results for input(s): "BNP", "PROBNP" in the last 168 hours.  DDimer No results for input(s): "DDIMER" in the last 168 hours.   Radiology    CARDIAC CATHETERIZATION  Result Date: 07/08/2022   Mid LM to Dist LM lesion is 25% stenosed.   Mid LAD lesion is 40% stenosed.   Ost Cx to Prox Cx lesion is 100% stenosed.   Prox RCA lesion is 50% stenosed.   Dist RCA lesion is 60% stenosed.   1st Diag-2 lesion is 100% stenosed.   1st Diag-1 lesion is 80%  stenosed. 1.  Largely unchanged coronary artery disease with chronic total occlusion of inferior branch of bifurcating diagonal and chronic total occlusion of the proximal left circumflex.  2.  Moderate in-stent restenosis of proximal right coronary artery stent with an associated RFR of 0.94. 3.  Moderate distal right coronary artery lesion with an RFR of 0.91. 4.  LVEDP of 18 mmHg. Recommendation: The images and RFR data were reviewed with Dr. Martinique the team C attending; medical therapy will be pursued.   DG Chest Port 1 View  Result Date: 07/07/2022 CLINICAL DATA:  72 year old male with history of chest pain and weakness. EXAM: PORTABLE CHEST 1 VIEW COMPARISON:  Chest x-ray 04/09/2022. FINDINGS: Lung volumes are normal. No consolidative airspace disease. No pleural effusions. No pneumothorax. Cephalization of the pulmonary vasculature, without frank pulmonary edema. Mild cardiomegaly. Upper mediastinal contours are within normal limits. Atherosclerotic calcifications are noted in the thoracic aorta. Left-sided pacemaker device in place with lead tips projecting over the expected location of the right atrium and right ventricle. IMPRESSION: 1. Cardiomegaly with pulmonary venous congestion, but no frank pulmonary edema. 2. Aortic atherosclerosis. Electronically Signed   By: Vinnie Langton M.D.   On: 07/07/2022 17:40    Cardiac Studies   Echo 10/11/21: IMPRESSIONS     1. Left ventricular ejection fraction, by estimation, is 55 to 60%. Left  ventricular ejection fraction by 2D MOD biplane is 58.8 %. The left  ventricle has normal function. The left ventricle demonstrates regional  wall motion abnormalities (see scoring  diagram/findings for description). There is mild left ventricular  hypertrophy. Left ventricular diastolic function could not be evaluated.  There is moderate hypokinesis of the left ventricular, basal inferolateral  wall.   2. Right ventricular systolic function is normal. The  right ventricular  size is normal. Tricuspid regurgitation signal is inadequate for assessing  PA pressure.   3. Calcified interatrial septum which is aneruysmal.   4. The mitral valve is abnormal. Trivial mitral valve regurgitation.   5. The aortic valve was not well visualized. Aortic valve regurgitation  is not visualized. No aortic stenosis is present.   6. The inferior vena cava is normal in size with greater than 50%  respiratory variability, suggesting right atrial pressure of 3 mmHg.   Comparison(s): Changes from prior study are noted.  10/19/2020: LVEF 60-65%.   Cardiac cath 07/08/22:  INTRAVASCULAR PRESSURE WIRE/FFR STUDY  LEFT HEART CATH AND CORONARY ANGIOGRAPHY   Conclusion      Mid LM to Dist LM lesion is 25% stenosed.   Mid LAD lesion is 40% stenosed.   Ost Cx to Prox Cx lesion is 100% stenosed.   Prox RCA lesion is 50% stenosed.   Dist RCA lesion is 60% stenosed.   1st Diag-2 lesion is 100% stenosed.   1st Diag-1 lesion is 80% stenosed.   1.  Largely unchanged coronary artery disease with chronic total occlusion of inferior branch of bifurcating diagonal and chronic total occlusion of the proximal left circumflex.   2.  Moderate in-stent restenosis of proximal right coronary artery stent with an associated RFR of 0.94. 3.  Moderate distal right coronary artery lesion with an RFR of 0.91. 4.  LVEDP of 18 mmHg.   Recommendation: The images and RFR data were reviewed with Dr. Martinique the team C attending; medical therapy will be pursued.  Coronary Diagrams  Diagnostic Dominance: Right  Interven    Patient Profile     72 y.o. male with a hx of ESRD on PD, HTN, HLD, type 2 DM, carotid artery disease, atrial fibrillation/flutter, CAD, HFpEF and OSA who is being seen 07/08/2022 for the evaluation of NSTEMI at the request of Internal Medicine.   Assessment & Plan    NSTEMI - on DAPT. Also on medical therapy with Coreg and amlodipine. Initially troponin peaked at 51.  Ecg not interpretable due to paced rhythm. Had recurrent chest pain this am with rise in troponin to 2300.  Cardiac cath reviewed from yesterday with Dr Ali Lowe and Dr Irish Lack. Patient has occluded sub branch of the first diagonal. The LCx is occluded. There is moderate disease in the proximal and distal RCA but FFR was OK. There is obstructive disease in the diagonal but this is fairly small in caliber and severely calcified. In short there are no good targets for PCI. We could potentially treat the diagonal but long term patency of this would be poor especially with heavy calcification and ESRD. Will push medical therapy. Add Imdur 60 mg daily. Not a candidate for Ranexa due to ESRD. EDP at cath was 18 mm Hg so volume status is Ok. Would continue to observe on telemetry today. Afib/flutter - chadsvasc2 not on anticoagulation due to issues with anemia in the past. Last EGD/colon on 10/2018. Non bleeding AVMs and small polyps found in colonoscopy. Polyp noted on EGD as well. No bleeding since then and Hb has remained stable (even with chronic DAPT at home)  Symptomatic bradycardia s/p PPM HFpEF EDP 18 mm Hg. Volume status maintained with PD ESRD on PD DM     For questions or updates, please contact [place] Please consult [email or web address] for contact info under        Signed, Clifton Kovacic Martinique, MD  07/09/2022, 8:54 AM

## 2022-07-09 NOTE — Progress Notes (Signed)
Kalman Shan, MD with Cardiology paged regarding SOB and chest pain. Awaiting response.

## 2022-07-09 NOTE — Progress Notes (Signed)
   Notified by RN that patient's troponin came back elevated to 2319. Per RN, patient had an episode of shortness of breath and left sided chest pain this AM at about 0500. Patient was given a breathing treatment with improvement in SOB. Was also given SL nitro with improvement in chest pain   When I evaluated patient, he reported that the episode of chest pain he had this AM was very similar to what he had when he presented to the hospital. After receiving SL nitro and breathing treatment, symptoms have resolved.  Dr. Martinique to see patient in rounds this AM.   Margie Billet, PA-C 07/09/2022 7:29 AM

## 2022-07-09 NOTE — Progress Notes (Signed)
[facility] Progress Note  Subjective: seen in room, no c/o's   Vitals:   07/09/22 1030 07/09/22 1100 07/09/22 1130 07/09/22 1200  BP: (!) 112/53 (!) 111/59 (!) 116/59 104/60  Pulse:  62    Resp: (!) 26 19 18 $ (!) 21  Temp:  98.8 F (37.1 C)    TempSrc:  Oral    SpO2: 93% 96% 96% 98%  Weight:      Height:        Exam: Gen alert, no distress, obese No jvd or bruits Chest clear bilat to bases RRR no MRG Abd soft ntnd no mass or ascites +bs Ext no LE edema Neuro is alert, Ox 3 , nf    PD cath in abd, LUA AVF in place      Home meds include - bumex 40m bid, rocaltrol 0.25 mcg po mwf, norvasc 5, aspirin, lipitor, coreg 25 bid, proliz, protonix, plavix, klor con 20 qod, risperdal, requip, zoloft, prns/ vits/ supps     OP PD: f[facility] [facility] [facility] [place] wife --> 4 exchanges overnight, 3000 fill, 1.5 hr dwell, use greens/ yellows mostly   Dry wt is 269 lbs/ 122kg   CXR - CM, poss vasc congestion, no edema    Assessment/ Plan: Chest pain - hx of CAD. LHC done yesterday, pt has lots of diffuse disease w/o good targets for stenting. Medical Rx per cardiology recs.  [place].  HTN/ volume - no vol excess on exam, lungs clear, CXR mild vasc congestion[facility] notes. Wt's are up still about 3kg. Will use all 2.5% fluids again tonite.  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  MBD ckd - CCa in range. Cont home po vdra.  Hyponatremia - prob due to vol excess, see above.  DM 2 HTN - takes coreg/ norvasc at home COPD      RKelly SplinterMD CKA 07/09/2022, 12:41 PM  Recent Labs  Lab 07/07/22 1704 07/08/22 0040 07/09/22 0055  HGB 11.7* 11.6* 11.0*  ALBUMIN 2.9* 2.8*  --   CALCIUM 8.9 9.2  --   CREATININE 5.26* 5.40*  --   K 3.4* 4.1  --    No results for input(s): "IRON", "TIBC", "FERRITIN" in the last 168 hours. Inpatient medications:   amLODipine  5 mg Oral Daily   aspirin EC  81 mg Oral Daily   atorvastatin  40 mg Oral Daily   budesonide  2 mL Inhalation BID   calcium citrate  200 mg of elemental calcium Oral Daily   carvedilol  25 mg Oral BID WC   Chlorhexidine Gluconate Cloth  6 each Topical Daily   clopidogrel  75 mg Oral Daily   enoxaparin (LOVENOX) injection  30 mg Subcutaneous Q24H   gentamicin cream  1 Application Topical Daily   insulin aspart  0-15 Units Subcutaneous TID WC   insulin aspart  0-5 Units Subcutaneous QHS   isosorbide mononitrate  60 mg Oral Daily   multivitamin  1 tablet Oral Daily   pantoprazole  40 mg Oral Daily   risperiDONE  0.5 mg Oral QHS   rOPINIRole  1 mg Oral Daily   sertraline  25 mg Oral Daily   sodium chloride flush  3 mL Intravenous Q12H    sodium chloride     sodium chloride, acetaminophen, acetaminophen, albuterol, melatonin, morphine injection, nitroGLYCERIN,  ondansetron (ZOFRAN) IV, ondansetron (ZOFRAN) IV, sodium chloride flush

## 2022-07-09 NOTE — Progress Notes (Signed)
Please see orders from [facility], MD for SL nitro.

## 2022-07-10 DIAGNOSIS — Z66 Do not resuscitate: Secondary | ICD-10-CM

## 2022-07-10 DIAGNOSIS — Z515 Encounter for palliative care: Secondary | ICD-10-CM | POA: Diagnosis not present

## 2022-07-10 DIAGNOSIS — I259 Chronic ischemic heart disease, unspecified: Secondary | ICD-10-CM

## 2022-07-10 DIAGNOSIS — N186 End stage renal disease: Secondary | ICD-10-CM | POA: Diagnosis not present

## 2022-07-10 DIAGNOSIS — Z7189 Other specified counseling: Secondary | ICD-10-CM

## 2022-07-10 DIAGNOSIS — J438 Other emphysema: Secondary | ICD-10-CM | POA: Diagnosis not present

## 2022-07-10 DIAGNOSIS — I214 Non-ST elevation (NSTEMI) myocardial infarction: Secondary | ICD-10-CM | POA: Diagnosis not present

## 2022-07-10 LAB — CBC
HCT: 31.2 % — ABNORMAL LOW (ref 39.0–52.0)
Hemoglobin: 10.8 g/dL — ABNORMAL LOW (ref 13.0–17.0)
MCH: 36 pg — ABNORMAL HIGH (ref 26.0–34.0)
MCHC: 34.6 g/dL (ref 30.0–36.0)
MCV: 104 fL — ABNORMAL HIGH (ref 80.0–100.0)
Platelets: 157 10*3/uL (ref 150–400)
RBC: 3 MIL/uL — ABNORMAL LOW (ref 4.22–5.81)
RDW: 14.1 % (ref 11.5–15.5)
WBC: 11 10*3/uL — ABNORMAL HIGH (ref 4.0–10.5)
nRBC: 0 % (ref 0.0–0.2)

## 2022-07-10 LAB — GLUCOSE, CAPILLARY
Glucose-Capillary: 128 mg/dL — ABNORMAL HIGH (ref 70–99)
Glucose-Capillary: 196 mg/dL — ABNORMAL HIGH (ref 70–99)
Glucose-Capillary: 150 mg/dL — ABNORMAL HIGH (ref 70–99)
Glucose-Capillary: 239 mg/dL — ABNORMAL HIGH (ref 70–99)

## 2022-07-10 LAB — LIPID PANEL
Cholesterol: 95 mg/dL (ref 0–200)
HDL: 38 mg/dL — ABNORMAL LOW (ref >40)
LDL Cholesterol: 32 mg/dL (ref 0–99)
Total CHOL/HDL Ratio: 2.5 RATIO
Triglycerides: 124 mg/dL (ref <150)
VLDL: 25 mg/dL (ref 0–40)

## 2022-07-10 MED ORDER — ALUM & MAG HYDROXIDE-SIMETH 200-200-20 MG/5ML PO SUSP
30.0000 mL | ORAL | Status: DC | PRN
  Administered 2022-07-10 – 2022-07-19 (×6): 30 mL via ORAL
  Filled 2022-07-10 (×7): qty 30

## 2022-07-10 MED ORDER — MAGNESIUM HYDROXIDE 400 MG/5ML PO SUSP
30.0000 mL | Freq: Every day | ORAL | Status: DC | PRN
  Administered 2022-07-14 – 2022-07-18 (×3): 30 mL via ORAL
  Filled 2022-07-10 (×5): qty 30

## 2022-07-10 MED ORDER — DELFLEX-LC/2.5% DEXTROSE 394 MOSM/L IP SOLN: INTRAPERITONEAL | Status: DC

## 2022-07-10 MED ORDER — MELATONIN 3 MG PO TABS
3.0000 mg | ORAL_TABLET | Freq: Every evening | ORAL | Status: DC | PRN
  Administered 2022-07-10: 3 mg via ORAL
  Filled 2022-07-10: qty 1

## 2022-07-10 MED ORDER — POLYETHYLENE GLYCOL 3350 17 G PO PACK
17.0000 g | PACK | Freq: Two times a day (BID) | ORAL | Status: AC
  Administered 2022-07-10 – 2022-07-12 (×3): 17 g via ORAL
  Filled 2022-07-10 (×3): qty 1

## 2022-07-10 NOTE — Progress Notes (Signed)
[facility]) Hospital liaison note  Notified by [facility] manager of patient/family request for [facility] Palliative services at home after discharge.  Unfortunately, patient will be discharging outside our service area. [facility] will not be able to follow.   Please call with any hospice or outpatient palliative care related questions.   Thank you for the opportunity to participate in this patient's care.  [place]  [facility] liaison  [phone]

## 2022-07-10 NOTE — Progress Notes (Signed)
[facility] PROGRESS NOTE    Progress Note  [name]  [id number] DOB: 1951-03-11 DOA: 07/07/2022 PCP: Tobe Sos, MD     Brief Narrative:   [name] is an 72 y.o. male past medical history of CAD, AAA, aortic atherosclerosis, atrial flutter, coronary artery disease end-stage renal disease on peritoneal dialysis, COPD, diabetes mellitus type 2 comes into the ED with chief complaint of chest pain that started 2 weeks prior to admission had a cath in 2021 showed significant disease they were unsuccessful in placing a stent so medical management was recommended on aspirin and statins beta-blocker and Plavix.  Cardiology consulted underwent cardiac cath that showed large unchanged coronary artery disease with chronic total occlusion of the inferior branch diagonal.  Moderate in-stent restenosis of proximal right coronary artery cardiology recommended medical therapy.    Assessment/Plan:   NSTEMI: Status postcardiac cath no good target lesion for PCI cardiology recommended medical management they recommended to add Imdur and continue DAPT therapy. Discussed with the wife and she relates that if patient is unable to get further treatment she may pursue hospice. Not a candidate for Ranexa due to end-stage renal disease. Neurology recommends to continue aspirin, statins, Coreg, Plavix and Imdur.  A-fib/atrial flutter: Not anticoagulation due to history of anemia and nonbleeding AVMs in June 2022 seen on colonoscopy. Globin has remained stable while on DAPT therapy.  Heart failure with reduced EF: EDP 18 mmHg continue further management per peritoneal dialysis. Further management per nephrology.  Symptomatic bradycardia: Status post pacemaker.  End-stage renal disease on peritoneal dialysis: Further management per nephrology. Weight is still up about 3 kg further fluid removal per nephrology.  Anemia of chronic renal disease: Hemoglobin has remained  relatively stable continue to monitor intermittently.  Metabolic bone disease: Continue PhosLo BDR added.  Hyponatremia: Further management per peritoneal dialysis.  Diabetes mellitus type 2 with nephropathy: Controlled on sliding scale insulin continue current management.  COPD: Continue home inhalers.  Hyperlipidemia:  continue statins.  Class I morbid obesity: Follow-up with PCP as an outpatient will need further evaluation for sleep apnea as an outpatient.  Goals of care: Made DNR palliative care evaluation has been requested  DVT prophylaxis: scd Family Communication:Wife Status is: Inpatient Remains inpatient appropriate because: NSTEMI    Code Status:     Code Status Orders  (From admission, onward)           Start     Ordered   07/08/22 0321  Do not attempt resuscitation (DNR)  Continuous       Question Answer Comment  If patient has no pulse and is not breathing Do Not Attempt Resuscitation   If patient has a pulse and/or is breathing: Medical Treatment Goals LIMITED ADDITIONAL INTERVENTIONS: Use medication/IV fluids and cardiac monitoring as indicated; Do not use intubation or mechanical ventilation (DNI), also provide comfort medications.  Transfer to Progressive/Stepdown as indicated, avoid Intensive Care.   Consent: Discussion documented in EHR or advanced directives reviewed      07/08/22 0320           Code Status History     Date Active Date Inactive Code Status Order ID Comments User Context   07/07/2022 1946 07/08/2022 0320 Full Code [id number]  Rolla Plate, DO ED   04/09/2022 2023 04/14/2022 1852 Full Code [id number]  Idamae Schuller, MD ED   10/10/2021 0740 10/11/2021 1957 Full Code [id number]  Bernadette Hoit, DO ED   10/08/2020 2241 10/13/2020 1726 Full Code [id number]  Truett Mainland, DO Inpatient   08/02/2019 1436 08/02/2019 2130 Full Code [id number]  Waynetta Sandy, MD Inpatient   07/22/2019 2142 07/25/2019 1615 Full Code  [id number]  Phillips Grout, MD ED   04/30/2019 1000 04/30/2019 1348 Full Code [id number]  Elam Dutch, MD Inpatient   04/10/2019 2050 04/13/2019 1425 DNR [id number]  Dewayne Hatch, MD ED   03/18/2018 2324 03/19/2018 1358 Full Code [id number]  Rosetta Posner, MD Inpatient   05/09/2017 1706 05/10/2017 2039 Full Code [id number]  Truett Mainland, DO Inpatient   04/04/2016 1949 04/08/2016 1806 Full Code [id number]  Orvan Falconer, MD Inpatient   09/27/2014 0146 09/28/2014 1733 DNR [id number]  Oswald Hillock, MD Inpatient         IV Access:   Peripheral IV   Procedures and diagnostic studies:   CARDIAC CATHETERIZATION  Result Date: 07/08/2022   Mid LM to Dist LM lesion is 25% stenosed.   Mid LAD lesion is 40% stenosed.   Ost Cx to Prox Cx lesion is 100% stenosed.   Prox RCA lesion is 50% stenosed.   Dist RCA lesion is 60% stenosed.   1st Diag-2 lesion is 100% stenosed.   1st Diag-1 lesion is 80% stenosed. 1.  Largely unchanged coronary artery disease with chronic total occlusion of inferior branch of bifurcating diagonal and chronic total occlusion of the proximal left circumflex.  2.  Moderate in-stent restenosis of proximal right coronary artery stent with an associated RFR of 0.94. 3.  Moderate distal right coronary artery lesion with an RFR of 0.91. 4.  LVEDP of 18 mmHg. Recommendation: The images and RFR data were reviewed with Dr. Martinique the team C attending; medical therapy will be pursued.     Medical Consultants:   None.   Subjective:    [name] denies any chest pain.  Objective:    Vitals:   07/10/22 0630 07/10/22 0741 07/10/22 0826 07/10/22 0829  BP: 116/61 (!) 119/56 97/77   Pulse:  60    Resp: 20 18 (!) 21   Temp:  98.4 F (36.9 C) (S) 98.3 F (36.8 C)   TempSrc:  Oral Oral   SpO2:  100% (!) 86% 92%  Weight:   123.4 kg   Height:       SpO2: 92 % (recheck d/t last reading) O2 Flow Rate (L/min): 4 L/min FiO2 (%): (!) 4 %   Intake/Output Summary  (Last 24 hours) at 07/10/2022 0921 Last data filed at 07/10/2022 0826 Gross per 24 hour  Intake 670.66 ml  Output 1246 ml  Net -575.34 ml   Filed Weights   07/09/22 1729 07/10/22 0516 07/10/22 0826  Weight: 125.2 kg 127.1 kg 123.4 kg    Exam: General exam: In no acute distress. Respiratory system: Good air movement and clear to auscultation. Cardiovascular system: S1 & S2 heard, RRR. No JVD. Gastrointestinal system: Abdomen is nondistended, soft and nontender.  Extremities: No pedal edema. Skin: No rashes, lesions or ulcers Psychiatry: Judgement and insight appear normal. Mood & affect appropriate.    Data Reviewed:    Labs: Basic Metabolic Panel: Recent Labs  Lab 07/07/22 1704 07/08/22 0040  NA 122* 126*  K 3.4* 4.1  CL 88* 89*  CO2 22 23  GLUCOSE 307* 279*  BUN 62* 67*  CREATININE 5.26* 5.40*  CALCIUM 8.9 9.2  MG  --  2.3   GFR Estimated Creatinine Clearance: 18.5 mL/min (A) (by C-G formula based on SCr of 5.4 mg/dL (H)). Liver  Function Tests: Recent Labs  Lab 07/07/22 1704 07/08/22 0040  AST 22 25  ALT 16 16  ALKPHOS 99 111  BILITOT 0.2* 0.6  PROT 6.7 6.6  ALBUMIN 2.9* 2.8*   No results for input(s): "LIPASE", "AMYLASE" in the last 168 hours. No results for input(s): "AMMONIA" in the last 168 hours. Coagulation profile No results for input(s): "INR", "PROTIME" in the last 168 hours. COVID-19 Labs  No results for input(s): "DDIMER", "FERRITIN", "LDH", "CRP" in the last 72 hours.  Lab Results  Component Value Date   SARSCOV2NAA NEGATIVE 04/09/2022   [place] NEGATIVE 10/08/2020   [place] NEGATIVE 09/20/2019   [place] NEGATIVE 07/30/2019    CBC: Recent Labs  Lab 07/07/22 1704 07/08/22 0040 07/09/22 0055 07/10/22 0100  WBC 13.8* 15.1* 12.9* 11.0*  NEUTROABS 11.7*  --   --   --   HGB 11.7* 11.6* 11.0* 10.8*  HCT 34.4* 33.0* 33.2* 31.2*  MCV 103.3* 101.5* 104.7* 104.0*  PLT 175 176 174 157   Cardiac Enzymes: No results for  input(s): "CKTOTAL", "CKMB", "CKMBINDEX", "TROPONINI" in the last 168 hours. BNP (last 3 results) No results for input(s): "PROBNP" in the last 8760 hours. CBG: Recent Labs  Lab 07/08/22 2135 07/09/22 0901 07/09/22 1104 07/09/22 1614 07/10/22 0629  GLUCAP 177* 247* 181* 152* 128*   D-Dimer: No results for input(s): "DDIMER" in the last 72 hours. Hgb A1c: No results for input(s): "HGBA1C" in the last 72 hours. Lipid Profile: Recent Labs    07/10/22 0100  CHOL 95  HDL 38*  LDLCALC 32  TRIG 124  CHOLHDL 2.5   Thyroid function studies: No results for input(s): "TSH", "T4TOTAL", "T3FREE", "THYROIDAB" in the last 72 hours.  Invalid input(s): "FREET3" Anemia work up: No results for input(s): "VITAMINB12", "FOLATE", "FERRITIN", "TIBC", "IRON", "RETICCTPCT" in the last 72 hours. Sepsis Labs: Recent Labs  Lab 07/07/22 1704 07/08/22 0040 07/09/22 0055 07/10/22 0100  WBC 13.8* 15.1* 12.9* 11.0*   Microbiology No results found for this or any previous visit (from the past 240 hour(s)).   Medications:    amLODipine  5 mg Oral Daily   aspirin EC  81 mg Oral Daily   atorvastatin  40 mg Oral Daily   budesonide  2 mL Inhalation BID   calcium citrate  200 mg of elemental calcium Oral Daily   carvedilol  25 mg Oral BID WC   Chlorhexidine Gluconate Cloth  6 each Topical Daily   clopidogrel  75 mg Oral Daily   enoxaparin (LOVENOX) injection  30 mg Subcutaneous Q24H   gentamicin cream  1 Application Topical Daily   insulin aspart  0-15 Units Subcutaneous TID WC   insulin aspart  0-5 Units Subcutaneous QHS   isosorbide mononitrate  60 mg Oral Daily   multivitamin  1 tablet Oral Daily   pantoprazole  40 mg Oral Daily   risperiDONE  0.5 mg Oral QHS   rOPINIRole  1 mg Oral Daily   sertraline  25 mg Oral Daily   sodium chloride flush  3 mL Intravenous Q12H   Continuous Infusions:  sodium chloride     dialysis solution 2.5% low-MG/low-CA     dialysis solution 2.5%  low-MG/low-CA     nitroGLYCERIN 10 mcg/min (07/10/22 0625)      LOS: 2 days   Charlynne Cousins  [facility]  07/10/2022, 9:21 AM

## 2022-07-10 NOTE — Consult Note (Signed)
Consultation Note Date: 07/10/2022   Patient Name: [name]  DOB: May 06, 1951  MRN: [id number]  Age / Sex: 72 y.o., male  PCP: Tobe Sos, MD Referring Physician: Aileen Fass, Tammi Klippel, MD  Reason for Consultation: Establishing goals of care  HPI/Patient Profile: 72 y.o. male  with past medical history of CAD, AAA, aortic atherosclerosis, atrial flutter, coronary artery disease, end-stage renal disease on peritoneal dialysis, COPD, and T2DM admitted on 07/07/2022 with chest pain.  Patient's status postcardiac cath with no good target lesion for PCI.  Medical management recommended by cardiology.  PMT consulted to discuss goals of care.  Clinical Assessment and Goals of Care: I have reviewed medical records including EPIC notes, labs and imaging, received report from RN, assessed the patient and then spoke with patient and his wife [name] to discuss diagnosis prognosis, [place], EOL wishes, disposition and options.  I introduced Palliative Medicine as specialized medical care for people living with serious illness. It focuses on providing relief from the symptoms and stress of a serious illness. The goal is to improve quality of life for both the patient and the family.  Patient and wife share that patient is ambulatory at home with walker.  Posterior he has had a good appetite.  Wife shares that chest pain has been an ongoing issue for about the past 2 weeks.   We discussed patient's current illness and what it means in the larger context of patient's on-going co-morbidities.  Both patient and wife understand that management with medications are currently the only option.  I attempted to elicit values and goals of care important to the patient.    Wife expresses some interest in obtaining second opinion.  We discussed ultimately she feels she needs more information prior to making decisions about how to move forward.  I discussed I would ask  cardiology to give her a call.  Discussed with wife the importance of continued conversation with family and the medical providers regarding overall plan of care and treatment options, ensuring decisions are within the context of the patients values and GOCs.    I did express that outpatient palliative care could assist with ongoing goals of care discussions outpatient after they receive follow-up with cardiology and wife was agreeable to this.  Questions and concerns were addressed. The family was encouraged to call with questions or concerns.  Primary Decision Maker PATIENT    SUMMARY OF RECOMMENDATIONS   -Wife feels she needs more information from cardiology, I have requested cardiology to give her a call -She may seek second opinion once he is discharged -Outpatient palliative to follow-up once discharged, to see given referral  Code Status/Advance Care Planning: DNR     Primary Diagnoses: Present on Admission:  Chest pain  (Resolved) Atrial flutter (HCC)  CAD (coronary artery disease)  Chronic diastolic CHF (congestive heart failure) (HCC)  COPD (chronic obstructive pulmonary disease) ([place])  Depression  Diabetes mellitus with nephropathy ([place])  ESRD (end stage renal disease) ([place])  Hypokalemia  Hyponatremia  Mixed hyperlipidemia   I have reviewed the medical record, interviewed the patient and family, and examined the patient. The following aspects are pertinent.  Past Medical History:  Diagnosis Date   AAA (abdominal aortic aneurysm) ([place]) 06/2016   Mild increase in size of 3.4 cm infrarenal abdominal aortic aneurysm   Anemia    Aortic atherosclerosis (HCC)    Arthritis    Asthma    Atrial flutter (HCC)    AVF (arteriovenous fistula) ([place])  Left forearm   CAD (coronary artery disease)    a. s/p prior stenting of RCA in 1999 b. cath in 2003 showing moderate CAD c. NST in 2016 showing small area of ischemic and low-risk   CHF (congestive heart failure) ([place])     CKD (chronic kidney disease), stage V ([place])    kidney transplant evaluation   COPD (chronic obstructive pulmonary disease) ([place])    with ongoing tobacco use and patient failed sham takes   Diverticulosis 2018   Mild sigmoid colon diverticulosis.   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    Reflux esophagitis    hx   Two-vessel coronary artery disease    moderate. by cath in 2003. Status post stenting of the mdi RCA November 1999 normal left ventricular ejection fraction   Wears glasses    Wears hearing aid    B/L   Social History   Socioeconomic History   Marital status: Married    Spouse name: Not on file   Number of children: 2   Years of education: Not on file   Highest education level: Not on file  Occupational History   Occupation: retired Engineer, structural  Tobacco Use   Smoking status: Former    Years: 40.00    Types: Cigarettes    Start date: 05/20/1968    Quit date: 11/30/2019    Years since quitting: 2.6   Smokeless tobacco: Never  Vaping Use   Vaping Use: Never used  Substance and Sexual Activity   Alcohol use: Not Currently    Alcohol/week: 0.0 standard drinks of alcohol    Comment: rare   Drug use: No   Sexual activity: Yes  Other Topics Concern   Not on file  Social History Narrative   Patient continues to smoke.    Social Determinants of Health   Financial Resource Strain: Not on file  Food Insecurity: Not on file  Transportation Needs: Not on  file  Physical Activity: Not on file  Stress: Not on file  Social Connections: Not on file   Family History  Problem Relation Age of Onset   Diabetes Mother    Alcohol abuse Father    Scheduled Meds:  amLODipine  5 mg Oral Daily   aspirin EC  81 mg Oral Daily   atorvastatin  40 mg Oral Daily   budesonide  2 mL Inhalation BID   calcium citrate  200 mg of elemental calcium Oral Daily   carvedilol  25 mg Oral BID WC   Chlorhexidine Gluconate Cloth  6 each Topical Daily   clopidogrel  75 mg Oral Daily   enoxaparin (LOVENOX) injection  30 mg Subcutaneous Q24H   gentamicin cream  1 Application Topical Daily   insulin aspart  0-15 Units Subcutaneous TID WC   insulin aspart  0-5 Units Subcutaneous QHS   isosorbide mononitrate  60 mg Oral  Daily   multivitamin  1 tablet Oral Daily   pantoprazole  40 mg Oral Daily   risperiDONE  0.5 mg Oral QHS   rOPINIRole  1 mg Oral Daily   sertraline  25 mg Oral Daily   sodium chloride flush  3 mL Intravenous Q12H   Continuous Infusions:  sodium chloride     dialysis solution 2.5% low-MG/low-CA     nitroGLYCERIN Stopped (07/10/22 0810)   PRN Meds:.sodium chloride, acetaminophen, acetaminophen, albuterol, morphine injection, nitroGLYCERIN, ondansetron (ZOFRAN) IV, ondansetron (ZOFRAN) IV, sodium chloride flush Allergies  Allergen Reactions   Hydrocodone Itching   Hydrocodone-Acetaminophen Itching   Other     Opioids cause hallucinations and delusions has to be given risperidone to settle down Other reaction(s): hallucinations/delirium  Opiates (Uncoded) Reaction:  Serotonin Syndrome     Oxycodone-Acetaminophen Other (See Comments)   Review of Systems  Constitutional:  Positive for activity change.  Respiratory:  Negative for shortness of breath.   Cardiovascular:        Chest pain better today    Physical Exam Constitutional:      General: He is not in acute distress.    Appearance: He is ill-appearing.  Pulmonary:     Effort:  Pulmonary effort is normal.  Skin:    General: Skin is warm and dry.  Neurological:     Mental Status: He is alert and oriented to person, place, and time.     Vital Signs: BP (!) 102/54 (BP Location: Left Leg)   Pulse 64   Temp 98.4 F (36.9 C) (Oral)   Resp 18   Ht 6' 4"$  (1.93 m) Comment: in  Wt 123.1 kg   SpO2 94%   BMI 33.03 kg/m  Pain Scale: 0-10 POSS *See Group Information*: 1-Acceptable,Awake and alert Pain Score: 0-No pain   SpO2: SpO2: 94 % O2 Device:SpO2: 94 % O2 Flow Rate: .O2 Flow Rate (L/min): 2 L/min  IO: Intake/output summary:  Intake/Output Summary (Last 24 hours) at 07/10/2022 1444 Last data filed at 07/10/2022 0900 Gross per 24 hour  Intake 790.66 ml  Output 1246 ml  Net -455.34 ml    LBM: Last BM Date : 07/07/22 Baseline Weight: Weight: 122 kg Most recent weight: Weight: 123.1 kg     Palliative Assessment/Data: PPS 40%     *Please note that this is a verbal dictation therefore any spelling or grammatical errors are due to the "[employer]" system interpretation.   Juel Burrow, DNP, AGNP-C Palliative Medicine Team [phone] Pager: [phone]

## 2022-07-10 NOTE — Progress Notes (Signed)
[facility] Progress Note  Subjective: seen in room, no c/o's   Vitals:   07/10/22 0900 07/10/22 0922 07/10/22 1000 07/10/22 1130  BP: (!) 94/59  (!) 105/57 (!) 102/54  Pulse:    64  Resp: (!) 21  (!) 21 18  Temp:    98.4 F (36.9 C)  TempSrc:    Oral  SpO2: 92% 99% 92% 94%  Weight:   123.1 kg   Height:        Exam: Gen alert, no distress, obese No jvd or bruits Chest clear bilat to bases RRR no MRG Abd soft ntnd no mass or ascites +bs Ext no LE edema Neuro is alert, Ox 3 , nf    PD cath in abd, LUA AVF in place      Home meds include - bumex 32m bid, rocaltrol 0.25 mcg po mwf, norvasc 5, aspirin, lipitor, coreg 25 bid, proliz, protonix, plavix, klor con 20 qod, risperdal, requip, zoloft, prns/ vits/ supps     OP PD: f[facility], [place] [facility] [place] Per the wife --> 4 exchanges overnight, 3000 fill, 1.5 hr dwell, use greens/ yellows mostly   Dry wt is 269 lbs/ 122kg   CXR - CM, poss vasc congestion, no edema    Assessment/ Plan: Chest pain - hx of CAD. LHC done yesterday, pt has lots of diffuse disease w/o good targets for stenting. Medical Rx per cardiology recs.  [place].  HTN/ volume - no vol excess on exam, lungs clear, CXR mild vasc congestion[facility] per cardiology notes. Wt's are up still about 3kg. Will use all 2.5% fluids again tonite.  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  MBD ckd - CCa in range. Cont home po vdra.  Hyponatremia - prob due to vol excess, see above.  DM 2 HTN - takes coreg/ norvasc at home COPD      RKelly SplinterMD CFort Rucker2/21/2024, 1:23 PM  Recent Labs  Lab 07/07/22 1704 07/08/22 0040 07/09/22 0055 07/10/22 0100  HGB 11.7* 11.6* 11.0* 10.8*  ALBUMIN 2.9* 2.8*  --   --   CALCIUM 8.9 9.2  --   --   CREATININE 5.26* 5.40*  --   --   K 3.4* 4.1  --   --     No results for input(s): "IRON", "TIBC", "FERRITIN" in the  last 168 hours. Inpatient medications:  amLODipine  5 mg Oral Daily   aspirin EC  81 mg Oral Daily   atorvastatin  40 mg Oral Daily   budesonide  2 mL Inhalation BID   calcium citrate  200 mg of elemental calcium Oral Daily   carvedilol  25 mg Oral BID WC   Chlorhexidine Gluconate Cloth  6 each Topical Daily   clopidogrel  75 mg Oral Daily   enoxaparin (LOVENOX) injection  30 mg Subcutaneous Q24H   gentamicin cream  1 Application Topical Daily   insulin aspart  0-15 Units Subcutaneous TID WC   insulin aspart  0-5 Units Subcutaneous QHS   isosorbide mononitrate  60 mg Oral Daily   multivitamin  1 tablet Oral Daily   pantoprazole  40 mg Oral Daily   risperiDONE  0.5 mg Oral QHS   rOPINIRole  1 mg Oral Daily   sertraline  25 mg Oral Daily   sodium chloride flush  3 mL Intravenous Q12H  sodium chloride     dialysis solution 2.5% low-MG/low-CA     dialysis solution 2.5% low-MG/low-CA     nitroGLYCERIN Stopped (07/10/22 0810)   sodium chloride, acetaminophen, acetaminophen, albuterol, morphine injection, nitroGLYCERIN, ondansetron (ZOFRAN) IV, ondansetron (ZOFRAN) IV, sodium chloride flush

## 2022-07-10 NOTE — Evaluation (Addendum)
Physical Therapy Evaluation Patient Details Name: [name] MRN: [id number] DOB: [birth year] Today's Date: 07/10/2022  History of Present Illness  Pt is 72 y.o. male admitted 04/09/22  to the ER at [facility] with abdominal pain concerning for peritonitis, transferred to [facility]. Pt with a history of ESRD on PD, CAD, HF with preserved EF, COPD, PD, and HTN  Clinical Impression  Pt had fair tolerance to treatment today. Pt was able to perform 4 sit to stands however required multiple attempts and Mod A. When standing pt was able to perform a few side steps but was very limited by decreased activity tolerance. Given that pt is far from baseline, pt would benefit further rehab at the post acute level. Pt would benefit from further functional mobility training during acute stay.        Recommendations for follow up therapy are one component of a multi-disciplinary discharge planning process, led by the attending physician.  Recommendations may be updated based on patient status, additional functional criteria and insurance authorization.  Follow Up Recommendations Skilled nursing-short term rehab (<3 hours/day) Can patient physically be transported by private vehicle: No    Assistance Recommended at Discharge Frequent or constant Supervision/Assistance  Patient can return home with the following  A lot of help with walking and/or transfers;A lot of help with bathing/dressing/bathroom;Two people to help with bathing/dressing/bathroom;Assistance with cooking/housework;Assist for transportation;Help with stairs or ramp for entrance    Equipment Recommendations Other (comment) (Per accepting facility)  Recommendations for Other Services       Functional Status Assessment Patient has had a recent decline in their functional status and demonstrates the ability to make significant improvements in function in a reasonable and predictable amount of time.     Precautions / Restrictions         Mobility  Bed Mobility Overal bed mobility: Independent                  Transfers Overall transfer level: Needs assistance Equipment used: Rolling walker (2 wheels) Transfers: Sit to/from Stand Sit to Stand: Mod assist           General transfer comment: Pt was able to perform STS x4 however required multiple attempts and Mod A to stand.    Ambulation/Gait                  Stairs            Wheelchair Mobility    Modified Rankin (Stroke Patients Only)       Balance Overall balance assessment: No apparent balance deficits (not formally assessed)                                           Pertinent Vitals/Pain Pain Assessment Pain Assessment: No/denies pain    Home Living Family/patient expects to be discharged to:: Private residence Living Arrangements: Spouse/significant other Available Help at Discharge: Family;Available 24 hours/day Type of Home: House Home Access: Level entry       Home Layout: One level Home Equipment: Rolling Walker (2 wheels);Grab bars - tub/shower;Grab bars - toilet      Prior Function Prior Level of Function : Independent/Modified Independent             Mobility Comments: RW ADLs Comments: Independent     Hand Dominance   Dominant Hand: Right    Extremity/Trunk Assessment  Upper Extremity Assessment Upper Extremity Assessment: Overall WFL for tasks assessed    Lower Extremity Assessment Lower Extremity Assessment: Generalized weakness       Communication   Communication: No difficulties  Cognition Arousal/Alertness: Awake/alert Behavior During Therapy: Anxious, Flat affect (Anxious when standing but overall very flat affect) Overall Cognitive Status: Within Functional Limits for tasks assessed                                          General Comments General comments (skin integrity, edema, etc.): BP: 102/53, HR 64 at rest. VSS during sit to  stands    Exercises     Assessment/Plan    PT Assessment Patient needs continued PT services  PT Problem List Decreased strength;Decreased activity tolerance;Decreased mobility       PT Treatment Interventions Functional mobility training;Patient/family education    PT Goals (Current goals can be found in the Care Plan section)  Acute Rehab PT Goals Patient Stated Goal: To get back home PT Goal Formulation: With patient Time For Goal Achievement: 07/25/22 Potential to Achieve Goals: Good    Frequency Min 3X/week     Co-evaluation               AM-PAC PT "6 Clicks" Mobility  Outcome Measure Help needed turning from your back to your side while in a flat bed without using bedrails?: None Help needed moving from lying on your back to sitting on the side of a flat bed without using bedrails?: None Help needed moving to and from a bed to a chair (including a wheelchair)?: A Lot Help needed standing up from a chair using your arms (e.g., wheelchair or bedside chair)?: A Lot Help needed to walk in hospital room?: A Lot Help needed climbing 3-5 steps with a railing? : Total 6 Click Score: 15    End of Session Equipment Utilized During Treatment: Gait belt Activity Tolerance: Patient limited by fatigue Patient left: in bed;with call bell/phone within reach;with bed alarm set Nurse Communication: Mobility status PT Visit Diagnosis: Other abnormalities of gait and mobility (R26.89);Muscle weakness (generalized) (M62.81)    Time: [id number] PT Time Calculation (min) (ACUTE ONLY): 36 min   Charges:   PT Evaluation $PT Eval Moderate Complexity: 1 Mod PT Treatments $Therapeutic Activity: 23-37 mins        [name], PT, DPT Acute Rehab Services [id number]   [name] 07/10/2022, 4:23 PM

## 2022-07-10 NOTE — Progress Notes (Signed)
Rounding Note    Patient Name: [name] Date of Encounter: 07/10/2022  [place] Cardiologist: Carlyle Dolly, MD   Subjective   Feels better today. No chest pain overnight. Off IV Ntg.    Inpatient Medications    Scheduled Meds:  amLODipine  5 mg Oral Daily   aspirin EC  81 mg Oral Daily   atorvastatin  40 mg Oral Daily   budesonide  2 mL Inhalation BID   calcium citrate  200 mg of elemental calcium Oral Daily   carvedilol  25 mg Oral BID WC   Chlorhexidine Gluconate Cloth  6 each Topical Daily   clopidogrel  75 mg Oral Daily   enoxaparin (LOVENOX) injection  30 mg Subcutaneous Q24H   gentamicin cream  1 Application Topical Daily   insulin aspart  0-15 Units Subcutaneous TID WC   insulin aspart  0-5 Units Subcutaneous QHS   isosorbide mononitrate  60 mg Oral Daily   multivitamin  1 tablet Oral Daily   pantoprazole  40 mg Oral Daily   risperiDONE  0.5 mg Oral QHS   rOPINIRole  1 mg Oral Daily   sertraline  25 mg Oral Daily   sodium chloride flush  3 mL Intravenous Q12H   Continuous Infusions:  sodium chloride     dialysis solution 2.5% low-MG/low-CA     dialysis solution 2.5% low-MG/low-CA     nitroGLYCERIN 10 mcg/min (07/10/22 0625)   PRN Meds: sodium chloride, acetaminophen, acetaminophen, albuterol, morphine injection, nitroGLYCERIN, ondansetron (ZOFRAN) IV, ondansetron (ZOFRAN) IV, sodium chloride flush   Vital Signs    Vitals:   07/10/22 0741 07/10/22 0826 07/10/22 0829 07/10/22 0922  BP: (!) 119/56 97/77    Pulse: 60     Resp: 18 (!) 21    Temp: 98.4 F (36.9 C) (S) 98.3 F (36.8 C)    TempSrc: Oral Oral    SpO2: 100% (!) 86% 92% 99%  Weight:  123.4 kg    Height:        Intake/Output Summary (Last 24 hours) at 07/10/2022 0940 Last data filed at 07/10/2022 0826 Gross per 24 hour  Intake 670.66 ml  Output 1246 ml  Net -575.34 ml       07/10/2022    8:26 AM 07/10/2022    5:16 AM 07/09/2022    5:29 PM  Last 3 Weights   Weight (lbs) 272 lb 0.8 oz 280 lb 3.3 oz 276 lb 0.3 oz  Weight (kg) 123.4 kg 127.1 kg 125.2 kg      Telemetry    Afib with intermittent paced rhythm - Personally Reviewed  ECG    Afib with V pacing.  - Personally Reviewed  Physical Exam   GEN: morbidly obese Neck: mild JVD Cardiac: IRRR, no murmurs, rubs, or gallops. Radial site looks OK Respiratory: Clear to auscultation bilaterally. GI: Soft, nontender, non-distended  MS: No edema; No deformity. Neuro:  Nonfocal  Psych: Normal affect   Labs    High Sensitivity Troponin:   Recent Labs  Lab 07/07/22 1704 07/07/22 1849 07/08/22 0040 07/09/22 0055 07/09/22 1432  TROPONINIHS 41* 41* 53* 2,319* 1,796*      Chemistry Recent Labs  Lab 07/07/22 1704 07/08/22 0040  NA 122* 126*  K 3.4* 4.1  CL 88* 89*  CO2 22 23  GLUCOSE 307* 279*  BUN 62* 67*  CREATININE 5.26* 5.40*  CALCIUM 8.9 9.2  MG  --  2.3  PROT 6.7 6.6  ALBUMIN 2.9* 2.8*  AST  22 25  ALT 16 16  ALKPHOS 99 111  BILITOT 0.2* 0.6  GFRNONAA 11* 11*  ANIONGAP 12 14     Lipids  Recent Labs  Lab 07/10/22 0100  CHOL 95  TRIG 124  HDL 38*  LDLCALC 32  CHOLHDL 2.5    Hematology Recent Labs  Lab 07/08/22 0040 07/09/22 0055 07/10/22 0100  WBC 15.1* 12.9* 11.0*  RBC 3.25* 3.17* 3.00*  HGB 11.6* 11.0* 10.8*  HCT 33.0* 33.2* 31.2*  MCV 101.5* 104.7* 104.0*  MCH 35.7* 34.7* 36.0*  MCHC 35.2 33.1 34.6  RDW 13.7 14.0 14.1  PLT 176 174 157    Thyroid No results for input(s): "TSH", "FREET4" in the last 168 hours.  BNPNo results for input(s): "BNP", "PROBNP" in the last 168 hours.  DDimer No results for input(s): "DDIMER" in the last 168 hours.   Radiology    CARDIAC CATHETERIZATION  Result Date: 07/08/2022   Mid LM to Dist LM lesion is 25% stenosed.   Mid LAD lesion is 40% stenosed.   Ost Cx to Prox Cx lesion is 100% stenosed.   Prox RCA lesion is 50% stenosed.   Dist RCA lesion is 60% stenosed.   1st Diag-2 lesion is 100% stenosed.   1st  Diag-1 lesion is 80% stenosed. 1.  Largely unchanged coronary artery disease with chronic total occlusion of inferior branch of bifurcating diagonal and chronic total occlusion of the proximal left circumflex.  2.  Moderate in-stent restenosis of proximal right coronary artery stent with an associated RFR of 0.94. 3.  Moderate distal right coronary artery lesion with an RFR of 0.91. 4.  LVEDP of 18 mmHg. Recommendation: The images and RFR data were reviewed with Dr. Martinique the team C attending; medical therapy will be pursued.    Cardiac Studies   Echo 10/11/21: IMPRESSIONS     1. Left ventricular ejection fraction, by estimation, is 55 to 60%. Left  ventricular ejection fraction by 2D MOD biplane is 58.8 %. The left  ventricle has normal function. The left ventricle demonstrates regional  wall motion abnormalities (see scoring  diagram/findings for description). There is mild left ventricular  hypertrophy. Left ventricular diastolic function could not be evaluated.  There is moderate hypokinesis of the left ventricular, basal inferolateral  wall.   2. Right ventricular systolic function is normal. The right ventricular  size is normal. Tricuspid regurgitation signal is inadequate for assessing  PA pressure.   3. Calcified interatrial septum which is aneruysmal.   4. The mitral valve is abnormal. Trivial mitral valve regurgitation.   5. The aortic valve was not well visualized. Aortic valve regurgitation  is not visualized. No aortic stenosis is present.   6. The inferior vena cava is normal in size with greater than 50%  respiratory variability, suggesting right atrial pressure of 3 mmHg.   Comparison(s): Changes from prior study are noted. 10/19/2020: LVEF 60-65%.   Cardiac cath 07/08/22:  INTRAVASCULAR PRESSURE WIRE/FFR STUDY  LEFT HEART CATH AND CORONARY ANGIOGRAPHY   Conclusion      Mid LM to Dist LM lesion is 25% stenosed.   Mid LAD lesion is 40% stenosed.   Ost Cx to Prox Cx  lesion is 100% stenosed.   Prox RCA lesion is 50% stenosed.   Dist RCA lesion is 60% stenosed.   1st Diag-2 lesion is 100% stenosed.   1st Diag-1 lesion is 80% stenosed.   1.  Largely unchanged coronary artery disease with chronic total occlusion of inferior branch  of bifurcating diagonal and chronic total occlusion of the proximal left circumflex.   2.  Moderate in-stent restenosis of proximal right coronary artery stent with an associated RFR of 0.94. 3.  Moderate distal right coronary artery lesion with an RFR of 0.91. 4.  LVEDP of 18 mmHg.   Recommendation: The images and RFR data were reviewed with Dr. Martinique the team C attending; medical therapy will be pursued.  Coronary Diagrams  Diagnostic Dominance: Right  Interven    Patient Profile     72 y.o. male with a hx of ESRD on PD, HTN, HLD, type 2 DM, carotid artery disease, atrial fibrillation/flutter, CAD, HFpEF and OSA who is being seen 07/08/2022 for the evaluation of NSTEMI at the request of Internal Medicine.   Assessment & Plan    NSTEMI - on DAPT. Also on medical therapy with Coreg and amlodipine. Initially troponin peaked at 51. Ecg not interpretable due to paced rhythm. Had recurrent chest pain yesterday am with rise in troponin to 2300. Subsequent troponin trending down. Was on IV Ntg but this has been discontinued.  Cardiac cath reviewed  with Dr Ali Lowe and Dr Irish Lack. Patient has occluded sub branch of the first diagonal. The LCx is occluded. There is moderate disease in the proximal and distal RCA but FFR was OK. There is obstructive disease in the diagonal but this is fairly small in caliber and severely calcified. In short there are no good targets for PCI. We could potentially treat the diagonal but long term patency of this would be poor especially with heavy calcification and ESRD. Will push medical therapy. Added Imdur 60 mg daily. Not a candidate for Ranexa due to ESRD. EDP at cath was 18 mm Hg so volume status  is Ok. Plan to ambulate with PT. If no further pain could be DC on medical therapy. Arrange follow up in our [place] office with Dr [name]. Afib/flutter - chadsvasc2 not on anticoagulation due to issues with anemia in the past. Last EGD/colon on 10/2018. Non bleeding AVMs and small polyps found in colonoscopy. Polyp noted on EGD as well. No bleeding since then and Hb has remained stable (even with chronic DAPT at home)  Symptomatic bradycardia s/p PPM HFpEF EDP 18 mm Hg. Volume status maintained with PD ESRD on PD DM     For questions or updates, please contact [place] Please consult [email or web address] for contact info under        Signed, Orion Mole Martinique, MD  07/10/2022, 9:40 AM

## 2022-07-10 NOTE — Progress Notes (Signed)
CARDIAC REHAB PHASE I     Pt resting in bed, feeling tired today. Pt has stood at bedside and reports feeling weak with standing. PT evaluation in place for today. Post MI education including MI booklet, site care, restrictions, risk factors, heart healthy diet, exercise guidelines and CRP2 reviewed. All questions and concerns addressed. Will refer to [facility] for [place]. Will continue to follow.   1320-1400  Vanessa Barbara, RN BSN 07/10/2022 1:55 PM

## 2022-07-10 NOTE — TOC Initial Note (Addendum)
Transition of Care [facility]) - Initial/Assessment Note    Patient Details  Name: [name] MRN: [id number] Date of Birth: 02-May-1951  Transition of Care [facility]) CM/SW Contact:    Zenon Mayo, RN Phone Number: 07/10/2022, 2:55 PM  Clinical Narrative:                 He is from home with wife, indep., he states his wife will transport him home at dc.  He has no needs at this time.  Wife states they do not want to be set up with outpatient palliative services.   Expected Discharge Plan: Home/Self Care Barriers to Discharge: Continued Medical Work up   Patient Goals and CMS Choice Patient states their goals for this hospitalization and ongoing recovery are:: return home with wife   Choice offered to / list presented to : NA      Expected Discharge Plan and Services In-house Referral: NA Discharge Planning Services: CM Consult Post Acute Care Choice: NA Living arrangements for the past 2 months: Single Family Home                 DME Arranged: N/A DME Agency: NA       HH Arranged: NA          Prior Living Arrangements/Services Living arrangements for the past 2 months: Single Family Home Lives with:: Spouse Patient language and need for interpreter reviewed:: Yes Do you feel safe going back to the place where you live?: Yes      Need for Family Participation in Patient Care: Yes (Comment) Care giver support system in place?: Yes (comment)   Criminal Activity/Legal Involvement Pertinent to Current Situation/Hospitalization: No - Comment as needed  Activities of Daily Living Home Assistive Devices/Equipment: Eyeglasses, Wheelchair ADL Screening (condition at time of admission) Patient's cognitive ability adequate to safely complete daily activities?: Yes Is the patient deaf or have difficulty hearing?: No Does the patient have difficulty seeing, even when wearing glasses/contacts?: No Does the patient have difficulty concentrating, remembering, or making  decisions?: No Patient able to express need for assistance with ADLs?: Yes  Permission Sought/Granted                  Emotional Assessment Appearance:: Appears stated age Attitude/Demeanor/Rapport: Engaged Affect (typically observed): Appropriate Orientation: : Oriented to Self, Oriented to Place, Oriented to  Time, Oriented to Situation Alcohol / Substance Use: Not Applicable Psych Involvement: No (comment)  Admission diagnosis:  NSTEMI (non-ST elevated myocardial infarction) [facility]) [I21.4] Chest pain [R07.9] Patient Active Problem List   Diagnosis Date Noted   Unstable angina ([place]) 07/08/2022   Abdominal pain 04/09/2022   NSTEMI (non-ST elevated myocardial infarction) ([place]) 10/10/2021   Restless leg syndrome 10/10/2021   Depression 10/10/2021   Obesity (BMI 30-39.9) 10/10/2021   Erectile dysfunction due to arterial insufficiency 06/06/2021   Colitis 10/08/2020   OAB (overactive bladder) 06/16/2020   Recurrent UTI 06/16/2020   Incomplete emptying of bladder 03/17/2020   Benign prostatic hyperplasia with urinary obstruction 03/17/2020   Difficulty urinating 02/16/2020   History of non-ST elevation myocardial infarction (NSTEMI) 07/22/2019   Renal mass 07/14/2019   Renal anasarca    ESRD (end stage renal disease) ([place]) 04/10/2019   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   Diffuse wheezing 12/02/2017   OSA and COPD overlap syndrome ([place]) 12/02/2017   Sleeps in sitting position due to orthopnea 12/02/2017   Pacemaker 12/02/2017   Bradycardia with 31-40 beats per minute 12/02/2017  Coronary artery disease due to calcified coronary lesion 12/02/2017   Insomnia due to medical condition 12/02/2017   Snoring 12/02/2017   Chronic diastolic CHF (congestive heart failure) ([place]) 05/10/2017   Atypical chest pain    Chest pain 05/09/2017   Acute renal failure superimposed on stage 4 chronic kidney disease ([place]) 05/09/2017   Hypokalemia 04/05/2016   Diabetes mellitus  with nephropathy ([place]) 04/05/2016   Hyponatremia 04/05/2016   Hematuria 04/05/2016   Diarrhea 04/05/2016   Bilateral diabetic foot ulcer associated with secondary diabetes mellitus ([place]) 04/05/2016   Streptococcal bacteremia 04/05/2016   Cellulitis of leg, left 04/05/2016   Sepsis ([place]) 04/04/2016   CAD (coronary artery disease) 11/10/2015   Bilateral lower extremity edema 07/25/2015   Foot infection 09/27/2014   Diabetic foot infection ([place]) 09/27/2014   Diabetes mellitus with renal manifestations, uncontrolled ([place])    Benign essential HTN    ED (erectile dysfunction) 06/09/2012   Hypertension 03/11/2011   OSTEOMYELITIS, ACUTE, ANKLE/FOOT 07/24/2010   EDEMA 02/15/2010   MORBID OBESITY 07/05/2009   TOBACCO ABUSE 07/05/2009   OBSTRUCTIVE SLEEP APNEA 07/05/2009   COPD (chronic obstructive pulmonary disease) ([place]) 07/05/2009   AODM 03/30/2008   Mixed hyperlipidemia 03/30/2008   Generalized anxiety disorder 03/30/2008   BUERGER'S DISEASE 03/30/2008   PCP:  Tobe Sos, MD Pharmacy:   [facility] [address] [place] [address] [place] [address] Phone: [phone] Fax: [phone]  [facility] [address] [place] [address] Phone: [phone] Fax: [phone]  [place] [place] [address] [place] [address] Phone: [phone] Fax: [phone]     Social Determinants of Health (SDOH) Social History: SDOH Screenings   Depression [phone]): Medium Risk (04/03/2020)  Tobacco Use: Medium Risk (07/09/2022)   SDOH Interventions:     Readmission Risk Interventions    07/10/2022    2:52 PM  Readmission Risk Prevention Plan  Transportation Screening Complete  PCP or Specialist Appt within 3-5 Days Complete  HRI or Home Care Consult Complete  Palliative Care Screening Not Applicable  Medication Review (RN  Care Manager) Complete

## 2022-07-10 NOTE — Progress Notes (Signed)
Pt was connected to PD tx--tolerated well VSS No complaints

## 2022-07-11 DIAGNOSIS — I214 Non-ST elevation (NSTEMI) myocardial infarction: Secondary | ICD-10-CM | POA: Diagnosis not present

## 2022-07-11 LAB — RENAL FUNCTION PANEL
Albumin: 2.5 g/dL — ABNORMAL LOW (ref 3.5–5.0)
Anion gap: 15 (ref 5–15)
BUN: 68 mg/dL — ABNORMAL HIGH (ref 8–23)
CO2: 24 mmol/L (ref 22–32)
Calcium: 8.9 mg/dL (ref 8.9–10.3)
Chloride: 94 mmol/L — ABNORMAL LOW (ref 98–111)
Creatinine, Ser: 6.09 mg/dL — ABNORMAL HIGH (ref 0.61–1.24)
GFR, Estimated: 9 mL/min — ABNORMAL LOW (ref >60)
Glucose, Bld: 193 mg/dL — ABNORMAL HIGH (ref 70–99)
Phosphorus: 4.8 mg/dL — ABNORMAL HIGH (ref 2.5–4.6)
Potassium: 3 mmol/L — ABNORMAL LOW (ref 3.5–5.1)
Sodium: 133 mmol/L — ABNORMAL LOW (ref 135–145)

## 2022-07-11 LAB — GLUCOSE, CAPILLARY
Glucose-Capillary: 187 mg/dL — ABNORMAL HIGH (ref 70–99)
Glucose-Capillary: 147 mg/dL — ABNORMAL HIGH (ref 70–99)
Glucose-Capillary: 158 mg/dL — ABNORMAL HIGH (ref 70–99)
Glucose-Capillary: 162 mg/dL — ABNORMAL HIGH (ref 70–99)

## 2022-07-11 LAB — CBC
HCT: 31.9 % — ABNORMAL LOW (ref 39.0–52.0)
Hemoglobin: 11.2 g/dL — ABNORMAL LOW (ref 13.0–17.0)
MCH: 36 pg — ABNORMAL HIGH (ref 26.0–34.0)
MCHC: 35.1 g/dL (ref 30.0–36.0)
MCV: 102.6 fL — ABNORMAL HIGH (ref 80.0–100.0)
Platelets: 153 10*3/uL (ref 150–400)
RBC: 3.11 MIL/uL — ABNORMAL LOW (ref 4.22–5.81)
RDW: 14.1 % (ref 11.5–15.5)
WBC: 12.2 10*3/uL — ABNORMAL HIGH (ref 4.0–10.5)
nRBC: 0 % (ref 0.0–0.2)

## 2022-07-11 MED ORDER — HYDROMORPHONE HCL 1 MG/ML IJ SOLN
0.5000 mg | INTRAMUSCULAR | Status: DC | PRN
  Administered 2022-07-11 – 2022-07-12 (×2): 0.5 mg via INTRAVENOUS
  Filled 2022-07-11 (×2): qty 0.5

## 2022-07-11 MED ORDER — MELATONIN 5 MG PO TABS
5.0000 mg | ORAL_TABLET | Freq: Once | ORAL | Status: AC
  Administered 2022-07-11: 5 mg via ORAL
  Filled 2022-07-11: qty 1

## 2022-07-11 MED ORDER — DELFLEX-LC/1.5% DEXTROSE 344 MOSM/L IP SOLN: INTRAPERITONEAL | Status: DC

## 2022-07-11 NOTE — NC FL2 (Signed)
[place] LEVEL OF CARE FORM     IDENTIFICATION  Patient Name: [name] Birthdate: Apr 27, 1951 Sex: male Admission Date (Current Location): 07/07/2022  [facility] and [place] Number:  Herbalist and Address:  [place], [place] [address] [place],  [address]      Provider Number: [id number]  Attending Physician Name and Address:  Charlynne Cousins, MD  Relative Name and Phone Number:       Current Level of Care: Hospital Recommended Level of Care: [place] Prior Approval Number:    Date Approved/Denied:   PASRR Number: Pending  Discharge Plan: SNF    Current Diagnoses: Patient Active Problem List   Diagnosis Date Noted   Unstable angina ([place]) 07/08/2022   Abdominal pain 04/09/2022   NSTEMI (non-ST elevated myocardial infarction) ([place]) 10/10/2021   Restless leg syndrome 10/10/2021   Depression 10/10/2021   Obesity (BMI 30-39.9) 10/10/2021   Erectile dysfunction due to arterial insufficiency 06/06/2021   Colitis 10/08/2020   OAB (overactive bladder) 06/16/2020   Recurrent UTI 06/16/2020   Incomplete emptying of bladder 03/17/2020   Benign prostatic hyperplasia with urinary obstruction 03/17/2020   Difficulty urinating 02/16/2020   History of non-ST elevation myocardial infarction (NSTEMI) 07/22/2019   Renal mass 07/14/2019   Renal anasarca    ESRD (end stage renal disease) ([place]) 04/10/2019   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[place]) 03/18/2018   Diffuse wheezing 12/02/2017   OSA and COPD overlap syndrome ([place]) 12/02/2017   Sleeps in sitting position due to orthopnea 12/02/2017   Pacemaker 12/02/2017   Bradycardia with 31-40 beats per minute 12/02/2017   Coronary artery disease due to calcified coronary lesion 12/02/2017   Insomnia due to medical condition 12/02/2017   Snoring 12/02/2017   Chronic diastolic CHF (congestive heart failure) ([place]) 05/10/2017   Atypical chest pain    Chest pain  05/09/2017   Acute renal failure superimposed on stage 4 chronic kidney disease ([place]) 05/09/2017   Hypokalemia 04/05/2016   Diabetes mellitus with nephropathy ([place]) 04/05/2016   Hyponatremia 04/05/2016   Hematuria 04/05/2016   Diarrhea 04/05/2016   Bilateral diabetic foot ulcer associated with secondary diabetes mellitus ([place]) 04/05/2016   Streptococcal bacteremia 04/05/2016   Cellulitis of leg, left 04/05/2016   Sepsis ([place]) 04/04/2016   CAD (coronary artery disease) 11/10/2015   Bilateral lower extremity edema 07/25/2015   Foot infection 09/27/2014   Diabetic foot infection ([place]) 09/27/2014   Diabetes mellitus with renal manifestations, uncontrolled ([place])    Benign essential HTN    ED (erectile dysfunction) 06/09/2012   Hypertension 03/11/2011   OSTEOMYELITIS, ACUTE, ANKLE/FOOT 07/24/2010   EDEMA 02/15/2010   MORBID OBESITY 07/05/2009   TOBACCO ABUSE 07/05/2009   OBSTRUCTIVE SLEEP APNEA 07/05/2009   COPD (chronic obstructive pulmonary disease) ([place]) 07/05/2009   AODM 03/30/2008   Mixed hyperlipidemia 03/30/2008   Generalized anxiety disorder 03/30/2008   BUERGER'S DISEASE 03/30/2008    Orientation RESPIRATION BLADDER Height & Weight     Self, Time, Situation, Place  Normal Incontinent, External catheter Weight: 268 lb 11.9 oz (121.9 kg) Height:  6' 4"$  (193 cm) (in)  BEHAVIORAL SYMPTOMS/MOOD NEUROLOGICAL BOWEL NUTRITION STATUS      Continent Diet (See dc summary)  AMBULATORY STATUS COMMUNICATION OF NEEDS Skin   Extensive Assist Verbally Normal                       Personal Care Assistance Level of Assistance  Feeding,  Bathing, Dressing Bathing Assistance: Maximum assistance Feeding assistance: Limited assistance Dressing Assistance: Maximum assistance     Functional Limitations Info  Sight, Hearing, Speech Sight Info: Impaired Hearing Info: Impaired Speech Info: Adequate    SPECIAL CARE FACTORS FREQUENCY  PT (By licensed PT), OT (By licensed OT)     PT  Frequency: 5xweek OT Frequency: 5xweek            Contractures Contractures Info: Not present    Additional Factors Info  Code Status, Allergies Code Status Info: DNR Allergies Info: Hydrocodone  Hydrocodone-acetaminophen  Other  Oxycodone-acetaminophen           Current Medications (07/11/2022):  This is the current hospital active medication list Current Facility-Administered Medications  Medication Dose Route Frequency Provider Last Rate Last Admin   0.9 %  sodium chloride infusion  250 mL Intravenous PRN [name], MD       acetaminophen (TYLENOL) tablet 650 mg  650 mg Oral Q4H PRN Zierle-Ghosh, Asia B, DO       acetaminophen (TYLENOL) tablet 650 mg  650 mg Oral Q4H PRN [name], MD       albuterol (PROVENTIL) (2.5 MG/3ML) 0.083% nebulizer solution 2.5 mg  2.5 mg Inhalation Q6H PRN Zierle-Ghosh, Asia B, DO   2.5 mg at 07/09/22 0453   alum & mag hydroxide-simeth (MAALOX/MYLANTA) 200-200-20 MG/5ML suspension 30 mL  30 mL Oral Q2H PRN Charlynne Cousins, MD   30 mL at 07/10/22 1845   amLODipine (NORVASC) tablet 5 mg  5 mg Oral Daily Zierle-Ghosh, Asia B, DO   5 mg at 07/11/22 [id number]   aspirin EC tablet 81 mg  81 mg Oral Daily Zierle-Ghosh, Asia B, DO   81 mg at 07/11/22 0925   atorvastatin (LIPITOR) tablet 40 mg  40 mg Oral Daily Zierle-Ghosh, Asia B, DO   40 mg at 07/11/22 0925   budesonide (PULMICORT) nebulizer solution 0.25 mg  2 mL Inhalation BID Zierle-Ghosh, Asia B, DO   0.25 mg at 07/11/22 0903   calcium citrate (CALCITRATE - dosed in mg elemental calcium) tablet 200 mg of elemental calcium  200 mg of elemental calcium Oral Daily Zierle-Ghosh, Asia B, DO   200 mg of elemental calcium at 07/11/22 0926   carvedilol (COREG) tablet 25 mg  25 mg Oral BID WC Zierle-Ghosh, Asia B, DO   25 mg at 07/11/22 [id number]   Chlorhexidine Gluconate Cloth 2 % PADS 6 each  6 each Topical Daily Oswald Hillock, MD   6 each at 07/11/22 [id number]   clopidogrel (PLAVIX) tablet 75 mg  75 mg Oral  Daily Zierle-Ghosh, Asia B, DO   75 mg at 07/11/22 [id number]   dialysis solution 1.5% low-MG/low-CA dianeal solution   Intraperitoneal Q24H Pham, Minh Q, RPH-CPP       enoxaparin (LOVENOX) injection 30 mg  30 mg Subcutaneous Q24H Kc, Ramesh, MD   30 mg at 07/11/22 [id number]   gentamicin cream (GARAMYCIN) 0.1 % 1 Application  1 Application Topical Daily Charlynne Cousins, MD   1 Application at [id number]   insulin aspart (novoLOG) injection 0-15 Units  0-15 Units Subcutaneous TID WC Zierle-Ghosh, Asia B, DO   2 Units at 07/11/22 1118   insulin aspart (novoLOG) injection 0-5 Units  0-5 Units Subcutaneous QHS Zierle-Ghosh, Asia B, DO   2 Units at 07/10/22 2156   isosorbide mononitrate (IMDUR) 24 hr tablet 60 mg  60 mg Oral Daily Martinique, Peter M, MD   60 mg  at 07/11/22 0925   magnesium hydroxide (MILK OF MAGNESIA) suspension 30 mL  30 mL Oral Daily PRN Charlynne Cousins, MD       morphine (PF) 2 MG/ML injection 2 mg  2 mg Intravenous Q4H PRN Ewing Schlein, MD   2 mg at 07/09/22 1314   multivitamin (RENA-VIT) tablet 1 tablet  1 tablet Oral Daily Zierle-Ghosh, Asia B, DO   1 tablet at 07/11/22 0925   nitroGLYCERIN (NITROSTAT) SL tablet 0.4 mg  0.4 mg Sublingual Q5 min PRN Marykay Lex, MD   0.4 mg at 07/09/22 1303   nitroGLYCERIN 50 mg in dextrose 5 % 250 mL (0.2 mg/mL) infusion  0-200 mcg/min Intravenous Titrated Martinique, Peter M, MD   Stopped at 07/10/22 0810   ondansetron (ZOFRAN) injection 4 mg  4 mg Intravenous Q6H PRN Zierle-Ghosh, Asia B, DO       ondansetron (ZOFRAN) injection 4 mg  4 mg Intravenous Q6H PRN [name], MD       pantoprazole (PROTONIX) EC tablet 40 mg  40 mg Oral Daily Zierle-Ghosh, Asia B, DO   40 mg at 07/11/22 0926   polyethylene glycol (MIRALAX / GLYCOLAX) packet 17 g  17 g Oral BID Charlynne Cousins, MD   17 g at 07/11/22 0917   risperiDONE (RISPERDAL) tablet 0.5 mg  0.5 mg Oral QHS Zierle-Ghosh, Asia B, DO   0.5 mg at 07/10/22 2032   rOPINIRole (REQUIP)  tablet 1 mg  1 mg Oral Daily Zierle-Ghosh, Asia B, DO   1 mg at 07/11/22 0926   sertraline (ZOLOFT) tablet 25 mg  25 mg Oral Daily Zierle-Ghosh, Asia B, DO   25 mg at 07/11/22 [id number]   sodium chloride flush (NS) 0.9 % injection 3 mL  3 mL Intravenous Q12H [name], MD   3 mL at 07/11/22 0929   sodium chloride flush (NS) 0.9 % injection 3 mL  3 mL Intravenous PRN [name], MD         Discharge Medications: Please see discharge summary for a list of discharge medications.  Relevant Imaging Results:  Relevant Lab Results:   Additional Information [id number]  Beckey Rutter, MSW, LCSWA, LCASA Transitions of Care  Clinical Social Worker I

## 2022-07-11 NOTE — Progress Notes (Addendum)
[name] Progress Note  Subjective: seen in room, no c/o's   Vitals:   07/11/22 0700 07/11/22 0745 07/11/22 0904 07/11/22 1054  BP: 117/73 112/76  101/67  Pulse: 70 70  65  Resp: 20 18  19  $ Temp: 98 F (36.7 C) 98 F (36.7 C)  97.6 F (36.4 C)  TempSrc:  Oral  Oral  SpO2: 98% 92% 93% 94%  Weight: 121.9 kg     Height:        Exam: Gen alert, no distress, obese No jvd or bruits Chest clear bilat to bases RRR no MRG Abd soft ntnd no mass or ascites +bs Ext no LE edema Neuro is alert, Ox 3 , nf    PD cath in abd, LUA AVF in place      Home meds include - bumex 34m bid, rocaltrol 0.25 mcg po mwf, norvasc 5, aspirin, lipitor, coreg 25 bid, proliz, protonix, plavix, klor con 20 qod, risperdal, requip, zoloft, prns/ vits/ supps     OP PD: f[facility]b [facility], [place] PD through [facility] [place] Per the wife --> 4 exchanges overnight, 3000 fill, 1.5 hr dwell, use greens/ yellows mostly   Dry wt is 269 lbs/ 122kg   CXR - CM, poss vasc congestion, no edema    Assessment/ Plan: Chest pain - hx of CAD. LHC done yesterday, pt has lots of diffuse disease w/o good targets for stenting. Medical Rx per cardiology recs.  [place] PD[place] PD tonight HTN/ volume - no vol excess on exam, lungs clear, CXR mild vasc congestion. LVEDP w/ cath was 18, euvolemic per cardiology notes. Got down to dry wt today at 121.9kg.  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  MBD ckd - CCa in range. Cont home po vdra.  Hyponatremia - prob due to vol excess, will repeat labs.  DM 2 HTN - takes coreg/ norvasc at home  RKelly SplinterMD C[place]2/22/2024, 11:50 AM  Recent Labs  Lab 07/07/22 1704 07/08/22 0040 07/09/22 0055 07/10/22 0100 07/11/22 0115  HGB 11.7* 11.6*   < > 10.8* 11.2*  ALBUMIN 2.9* 2.8*  --   --   --   CALCIUM 8.9 9.2  --   --   --   CREATININE 5.26* 5.40*  --   --   --   K 3.4* 4.1  --   --   --    < > = values in this  interval not displayed.    No results for input(s): "IRON", "TIBC", "FERRITIN" in the last 168 hours. Inpatient medications:  amLODipine  5 mg Oral Daily   aspirin EC  81 mg Oral Daily   atorvastatin  40 mg Oral Daily   budesonide  2 mL Inhalation BID   calcium citrate  200 mg of elemental calcium Oral Daily   carvedilol  25 mg Oral BID WC   Chlorhexidine Gluconate Cloth  6 each Topical Daily   clopidogrel  75 mg Oral Daily   enoxaparin (LOVENOX) injection  30 mg Subcutaneous Q24H   gentamicin cream  1 Application Topical Daily   insulin aspart  0-15 Units Subcutaneous TID WC   insulin aspart  0-5 Units Subcutaneous QHS   isosorbide mononitrate  60 mg Oral Daily   multivitamin  1 tablet Oral Daily   pantoprazole  40 mg Oral Daily   polyethylene glycol  17 g Oral BID  risperiDONE  0.5 mg Oral QHS   rOPINIRole  1 mg Oral Daily   sertraline  25 mg Oral Daily   sodium chloride flush  3 mL Intravenous Q12H    sodium chloride     dialysis solution 2.5% low-MG/low-CA     nitroGLYCERIN Stopped (07/10/22 0810)   sodium chloride, acetaminophen, acetaminophen, albuterol, alum & mag hydroxide-simeth, magnesium hydroxide, morphine injection, nitroGLYCERIN, ondansetron (ZOFRAN) IV, ondansetron (ZOFRAN) IV, sodium chloride flush

## 2022-07-11 NOTE — Care Management Important Message (Signed)
Important Message  Patient Details  Name: [name] MRN: [id number] Date of Birth: 11/06/1950   Medicare Important Message Given:  Yes     Shelda Altes 07/11/2022, 9:19 AM

## 2022-07-11 NOTE — Progress Notes (Signed)
CARDIAC REHAB PHASE I     Pt post MI education provided yesterday. Referral sent to [facility] for CRP2 per protocol. However, pt had palliative consult with plans for palliative care upon discharge. Mobility is currently limited. Cardiac rehab will sign off for now.  [id number]   Vanessa Barbara, RN BSN 07/11/2022 10:59 AM

## 2022-07-11 NOTE — Progress Notes (Signed)
Rounding Note    Patient Name: [name] Date of Encounter: 07/11/2022  [place] [facility] Cardiologist: Carlyle Dolly, MD   Subjective   Patient not very conversive today. Denies any more chest pain.   Inpatient Medications    Scheduled Meds:  amLODipine  5 mg Oral Daily   aspirin EC  81 mg Oral Daily   atorvastatin  40 mg Oral Daily   budesonide  2 mL Inhalation BID   calcium citrate  200 mg of elemental calcium Oral Daily   carvedilol  25 mg Oral BID WC   Chlorhexidine Gluconate Cloth  6 each Topical Daily   clopidogrel  75 mg Oral Daily   enoxaparin (LOVENOX) injection  30 mg Subcutaneous Q24H   gentamicin cream  1 Application Topical Daily   insulin aspart  0-15 Units Subcutaneous TID WC   insulin aspart  0-5 Units Subcutaneous QHS   isosorbide mononitrate  60 mg Oral Daily   multivitamin  1 tablet Oral Daily   pantoprazole  40 mg Oral Daily   polyethylene glycol  17 g Oral BID   risperiDONE  0.5 mg Oral QHS   rOPINIRole  1 mg Oral Daily   sertraline  25 mg Oral Daily   sodium chloride flush  3 mL Intravenous Q12H   Continuous Infusions:  sodium chloride     dialysis solution 2.5% low-MG/low-CA     nitroGLYCERIN Stopped (07/10/22 0810)   PRN Meds: sodium chloride, acetaminophen, acetaminophen, albuterol, alum & mag hydroxide-simeth, magnesium hydroxide, morphine injection, nitroGLYCERIN, ondansetron (ZOFRAN) IV, ondansetron (ZOFRAN) IV, sodium chloride flush   Vital Signs    Vitals:   07/11/22 0612 07/11/22 0700 07/11/22 0745 07/11/22 0904  BP: 117/73 117/73 112/76   Pulse: 70 70 70   Resp: 20 20 18   $ Temp:  98 F (36.7 C) 98 F (36.7 C)   TempSrc:   Oral   SpO2: 97% 98% 92% 93%  Weight:  121.9 kg    Height:        Intake/Output Summary (Last 24 hours) at 07/11/2022 0910 Last data filed at 07/11/2022 0700 Gross per 24 hour  Intake 183 ml  Output 2261 ml  Net -2078 ml       07/11/2022    7:00 AM 07/11/2022   12:29 AM 07/10/2022    10:00 AM  Last 3 Weights  Weight (lbs) 268 lb 11.9 oz 276 lb 10.8 oz 271 lb 6.2 oz  Weight (kg) 121.9 kg 125.5 kg 123.1 kg      Telemetry    Afib with intermittent paced rhythm - Personally Reviewed  ECG    Afib with V pacing.  - Personally Reviewed  Physical Exam   GEN: morbidly obese Neck: mild JVD Cardiac: IRRR, no murmurs, rubs, or gallops. Radial site looks OK Respiratory: Clear to auscultation bilaterally. GI: Soft, nontender, non-distended  MS: No edema; No deformity. Neuro:  Nonfocal  Psych: Normal affect   Labs    High Sensitivity Troponin:   Recent Labs  Lab 07/07/22 1704 07/07/22 1849 07/08/22 0040 07/09/22 0055 07/09/22 1432  TROPONINIHS 41* 41* 53* 2,319* 1,796*      Chemistry Recent Labs  Lab 07/07/22 1704 07/08/22 0040  NA 122* 126*  K 3.4* 4.1  CL 88* 89*  CO2 22 23  GLUCOSE 307* 279*  BUN 62* 67*  CREATININE 5.26* 5.40*  CALCIUM 8.9 9.2  MG  --  2.3  PROT 6.7 6.6  ALBUMIN 2.9* 2.8*  AST 22  25  ALT 16 16  ALKPHOS 99 111  BILITOT 0.2* 0.6  GFRNONAA 11* 11*  ANIONGAP 12 14     Lipids  Recent Labs  Lab 07/10/22 0100  CHOL 95  TRIG 124  HDL 38*  LDLCALC 32  CHOLHDL 2.5     Hematology Recent Labs  Lab 07/09/22 0055 07/10/22 0100 07/11/22 0115  WBC 12.9* 11.0* 12.2*  RBC 3.17* 3.00* 3.11*  HGB 11.0* 10.8* 11.2*  HCT 33.2* 31.2* 31.9*  MCV 104.7* 104.0* 102.6*  MCH 34.7* 36.0* 36.0*  MCHC 33.1 34.6 35.1  RDW 14.0 14.1 14.1  PLT 174 157 153    Thyroid No results for input(s): "TSH", "FREET4" in the last 168 hours.  BNPNo results for input(s): "BNP", "PROBNP" in the last 168 hours.  DDimer No results for input(s): "DDIMER" in the last 168 hours.   Radiology    No results found.  Cardiac Studies   Echo 10/11/21: IMPRESSIONS     1. Left ventricular ejection fraction, by estimation, is 55 to 60%. Left  ventricular ejection fraction by 2D MOD biplane is 58.8 %. The left  ventricle has normal function. The left  ventricle demonstrates regional  wall motion abnormalities (see scoring  diagram/findings for description). There is mild left ventricular  hypertrophy. Left ventricular diastolic function could not be evaluated.  There is moderate hypokinesis of the left ventricular, basal inferolateral  wall.   2. Right ventricular systolic function is normal. The right ventricular  size is normal. Tricuspid regurgitation signal is inadequate for assessing  PA pressure.   3. Calcified interatrial septum which is aneruysmal.   4. The mitral valve is abnormal. Trivial mitral valve regurgitation.   5. The aortic valve was not well visualized. Aortic valve regurgitation  is not visualized. No aortic stenosis is present.   6. The inferior vena cava is normal in size with greater than 50%  respiratory variability, suggesting right atrial pressure of 3 mmHg.   Comparison(s): Changes from prior study are noted. 10/19/2020: LVEF 60-65%.   Cardiac cath 07/08/22:  INTRAVASCULAR PRESSURE WIRE/FFR STUDY  LEFT HEART CATH AND CORONARY ANGIOGRAPHY   Conclusion      Mid LM to Dist LM lesion is 25% stenosed.   Mid LAD lesion is 40% stenosed.   Ost Cx to Prox Cx lesion is 100% stenosed.   Prox RCA lesion is 50% stenosed.   Dist RCA lesion is 60% stenosed.   1st Diag-2 lesion is 100% stenosed.   1st Diag-1 lesion is 80% stenosed.   1.  Largely unchanged coronary artery disease with chronic total occlusion of inferior branch of bifurcating diagonal and chronic total occlusion of the proximal left circumflex.   2.  Moderate in-stent restenosis of proximal right coronary artery stent with an associated RFR of 0.94. 3.  Moderate distal right coronary artery lesion with an RFR of 0.91. 4.  LVEDP of 18 mmHg.   Recommendation: The images and RFR data were reviewed with Dr. Martinique the team C attending; medical therapy will be pursued.  Coronary Diagrams  Diagnostic Dominance: Right  Interven    Patient Profile      72 y.o. male with a hx of ESRD on PD, HTN, HLD, type 2 DM, carotid artery disease, atrial fibrillation/flutter, CAD, HFpEF and OSA who is being seen 07/08/2022 for the evaluation of NSTEMI at the request of Internal Medicine.   Assessment & Plan    NSTEMI - on DAPT. Also on medical therapy with Coreg and amlodipine.  Initially troponin peaked at 51. Ecg not interpretable due to paced rhythm. Had recurrent chest pain 2 days ago with rise in troponin to 2300. Subsequent troponin trending down. Was on IV Ntg but this has been discontinued.  Cardiac cath reviewed  with Dr Ali Lowe and Dr Irish Lack. Patient has occluded sub branch of the first diagonal. The LCx is occluded. There is moderate disease in the proximal and distal RCA but FFR was OK. There is obstructive disease in the diagonal but this is fairly small in caliber and severely calcified. In short there are no good targets for PCI. We could potentially treat the diagonal but long term patency of this would be poor especially with heavy calcification and ESRD. Will push medical therapy. Added Imdur 60 mg daily. Not a candidate for Ranexa due to ESRD. EDP at cath was 18 mm Hg so volume status is Ok. Overall prognosis is not poor but main issue will be anginal control. I spoke extensively with wife yesterday. She asked about atherectomy and IC lithotripsy as options but this really is not applicable since no real PCI targets. Family may want to seek a second opinion which is fine.  Arrange follow up in our [place] office with Dr [name]. Afib/flutter - chadsvasc2 not on anticoagulation due to issues with anemia in the past. Last EGD/colon on 10/2018. Non bleeding AVMs and small polyps found in colonoscopy. Polyp noted on EGD as well. No bleeding since then and Hb has remained stable (even with chronic DAPT at home)  Symptomatic bradycardia s/p PPM HFpEF EDP 18 mm Hg. Volume status maintained with PD ESRD on PD DM     [place] will sign off.    Medication Recommendations:  as per [facility] Other recommendations (labs, testing, etc):  none Follow up as an outpatient:  follow up with Dr Carlyle Dolly in [place] office.    For questions or updates, please contact [place] Please consult [email or web address] for contact info under        Signed, Kedar Sedano Martinique, MD  07/11/2022, 9:10 AM

## 2022-07-11 NOTE — Progress Notes (Signed)
[facility] PROGRESS NOTE    Progress Note  [name]  [id number] DOB: 22-Jan-1951 DOA: 07/07/2022 PCP: Tobe Sos, MD     Brief Narrative:   [name] is an 72 y.o. male past medical history of CAD, AAA, aortic atherosclerosis, atrial flutter, coronary artery disease end-stage renal disease on peritoneal dialysis, COPD, diabetes mellitus type 2 comes into the ED with chief complaint of chest pain that started 2 weeks prior to admission had a cath in 2021 showed significant disease they were unsuccessful in placing a stent so medical management was recommended on aspirin and statins beta-blocker and Plavix.  Cardiology consulted underwent cardiac cath that showed large unchanged coronary artery disease with chronic total occlusion of the inferior branch diagonal.  Moderate in-stent restenosis of proximal right coronary artery cardiology recommended medical therapy.    Assessment/Plan:   NSTEMI: Status postcardiac cath no good target lesion for PCI cardiology recommended medical management they recommended to add Imdur and continue DAPT therapy. Palliative care discussed with family they would like a second opinion as an outpatient. Cardiology recommends to continue aspirin, statins, Coreg, Plavix and Imdur.  A-fib/atrial flutter: Not anticoagulation due to history of anemia and nonbleeding AVMs in June 2022 seen on colonoscopy. Hemoglobin has remained stable while on DAPT therapy.  Heart failure with reduced EF: EDP 18 mmHg continue further management per peritoneal dialysis. Further management per nephrology.  Symptomatic bradycardia: Status post pacemaker.  End-stage renal disease on peritoneal dialysis: Further management per nephrology.  Continue peritoneal dialysis at night. Weight is still up about 3 kg further fluid removal per nephrology.  Anemia of chronic renal disease: Hemoglobin has remained relatively stable continue to monitor  intermittently.  Metabolic bone disease: Continue PhosLo BDR added.  Hyponatremia: Further management per peritoneal dialysis.  Diabetes mellitus type 2 with nephropathy: Controlled on sliding scale insulin continue current management.  COPD: Continue home inhalers.  Hyperlipidemia:  continue statins.  Class I morbid obesity: Follow-up with PCP as an outpatient will need further evaluation for sleep apnea as an outpatient.  Goals of care: Made DNR palliative care evaluation has been requested  DVT prophylaxis: scd Family Communication:Wife Status is: Inpatient Remains inpatient appropriate because: NSTEMI    Code Status:     Code Status Orders  (From admission, onward)           Start     Ordered   07/08/22 0321  Do not attempt resuscitation (DNR)  Continuous       Question Answer Comment  If patient has no pulse and is not breathing Do Not Attempt Resuscitation   If patient has a pulse and/or is breathing: Medical Treatment Goals LIMITED ADDITIONAL INTERVENTIONS: Use medication/IV fluids and cardiac monitoring as indicated; Do not use intubation or mechanical ventilation (DNI), also provide comfort medications.  Transfer to Progressive/Stepdown as indicated, avoid Intensive Care.   Consent: Discussion documented in EHR or advanced directives reviewed      07/08/22 0320           Code Status History     Date Active Date Inactive Code Status Order ID Comments User Context   07/07/2022 1946 07/08/2022 0320 Full Code [id number]  Rolla Plate, DO ED   04/09/2022 2023 04/14/2022 1852 Full Code [id number]  Idamae Schuller, MD ED   10/10/2021 0740 10/11/2021 1957 Full Code [id number]  Bernadette Hoit, DO ED   10/08/2020 2241 10/13/2020 1726 Full Code [id number]  Truett Mainland, DO Inpatient   08/02/2019 1436  08/02/2019 2130 Full Code [id number]  Waynetta Sandy, MD Inpatient   07/22/2019 2142 07/25/2019 1615 Full Code [id number]  Phillips Grout, MD ED    04/30/2019 1000 04/30/2019 1348 Full Code [id number]  Elam Dutch, MD Inpatient   04/10/2019 2050 04/13/2019 1425 DNR [id number]  Dewayne Hatch, MD ED   03/18/2018 2324 03/19/2018 1358 Full Code [id number]  Rosetta Posner, MD Inpatient   05/09/2017 1706 05/10/2017 2039 Full Code [id number]  Truett Mainland, DO Inpatient   04/04/2016 1949 04/08/2016 1806 Full Code [id number]  Orvan Falconer, MD Inpatient   09/27/2014 0146 09/28/2014 1733 DNR [id number]  Oswald Hillock, MD Inpatient         IV Access:   Peripheral IV   Procedures and diagnostic studies:   No results found.   Medical Consultants:   None.   Subjective:    [name] feels good sleepy this morning denies any chest pain.  Objective:    Vitals:   07/11/22 0435 07/11/22 0612 07/11/22 0700 07/11/22 0745  BP: 101/67 117/73 117/73 112/76  Pulse: (!) 59 70 70 70  Resp: (!) 21 20 20 18  $ Temp: 98 F (36.7 C)  98 F (36.7 C) 98 F (36.7 C)  TempSrc: Oral   Oral  SpO2: 99% 97% 98% 92%  Weight:   121.9 kg   Height:       SpO2: 92 % O2 Flow Rate (L/min): 2 L/min FiO2 (%): 21 %   Intake/Output Summary (Last 24 hours) at 07/11/2022 0813 Last data filed at 07/11/2022 0700 Gross per 24 hour  Intake 303 ml  Output 3157 ml  Net -2854 ml    Filed Weights   07/10/22 1000 07/11/22 0029 07/11/22 0700  Weight: 123.1 kg 125.5 kg 121.9 kg    Exam: General exam: In no acute distress. Respiratory system: Good air movement and clear to auscultation. Cardiovascular system: S1 & S2 heard, RRR. No JVD. Gastrointestinal system: Abdomen is nondistended, soft and nontender.  Skin: No rashes, lesions or ulcers Psychiatry: Judgement and insight appear normal. Mood & affect appropriate.  Data Reviewed:    Labs: Basic Metabolic Panel: Recent Labs  Lab 07/07/22 1704 07/08/22 0040  NA 122* 126*  K 3.4* 4.1  CL 88* 89*  CO2 22 23  GLUCOSE 307* 279*  BUN 62* 67*  CREATININE 5.26* 5.40*  CALCIUM 8.9 9.2   MG  --  2.3    GFR Estimated Creatinine Clearance: 18.4 mL/min (A) (by C-G formula based on SCr of 5.4 mg/dL (H)). Liver Function Tests: Recent Labs  Lab 07/07/22 1704 07/08/22 0040  AST 22 25  ALT 16 16  ALKPHOS 99 111  BILITOT 0.2* 0.6  PROT 6.7 6.6  ALBUMIN 2.9* 2.8*    No results for input(s): "LIPASE", "AMYLASE" in the last 168 hours. No results for input(s): "AMMONIA" in the last 168 hours. Coagulation profile No results for input(s): "INR", "PROTIME" in the last 168 hours. COVID-19 Labs  No results for input(s): "DDIMER", "FERRITIN", "LDH", "CRP" in the last 72 hours.  Lab Results  Component Value Date   SARSCOV2NAA NEGATIVE 04/09/2022   SARSCOV2NAA NEGATIVE 10/08/2020   SARSCOV2NAA NEGATIVE 09/20/2019   Espy NEGATIVE 07/30/2019    CBC: Recent Labs  Lab 07/07/22 1704 07/08/22 0040 07/09/22 0055 07/10/22 0100 07/11/22 0115  WBC 13.8* 15.1* 12.9* 11.0* 12.2*  NEUTROABS 11.7*  --   --   --   --   HGB 11.7* 11.6* 11.0* 10.8* 11.2*  HCT 34.4* 33.0* 33.2* 31.2* 31.9*  MCV 103.3* 101.5* 104.7* 104.0* 102.6*  PLT 175 176 174 157 153    Cardiac Enzymes: No results for input(s): "CKTOTAL", "CKMB", "CKMBINDEX", "TROPONINI" in the last 168 hours. BNP (last 3 results) No results for input(s): "PROBNP" in the last 8760 hours. CBG: Recent Labs  Lab 07/10/22 0629 07/10/22 1129 07/10/22 1549 07/10/22 2123 07/11/22 0613  GLUCAP 128* 196* 150* 239* 187*    D-Dimer: No results for input(s): "DDIMER" in the last 72 hours. Hgb A1c: No results for input(s): "HGBA1C" in the last 72 hours. Lipid Profile: Recent Labs    07/10/22 0100  CHOL 95  HDL 38*  LDLCALC 32  TRIG 124  CHOLHDL 2.5    Thyroid function studies: No results for input(s): "TSH", "T4TOTAL", "T3FREE", "THYROIDAB" in the last 72 hours.  Invalid input(s): "FREET3" Anemia work up: No results for input(s): "VITAMINB12", "FOLATE", "FERRITIN", "TIBC", "IRON", "RETICCTPCT" in the last  72 hours. Sepsis Labs: Recent Labs  Lab 07/08/22 0040 07/09/22 0055 07/10/22 0100 07/11/22 0115  WBC 15.1* 12.9* 11.0* 12.2*    Microbiology No results found for this or any previous visit (from the past 240 hour(s)).   Medications:    amLODipine  5 mg Oral Daily   aspirin EC  81 mg Oral Daily   atorvastatin  40 mg Oral Daily   budesonide  2 mL Inhalation BID   calcium citrate  200 mg of elemental calcium Oral Daily   carvedilol  25 mg Oral BID WC   Chlorhexidine Gluconate Cloth  6 each Topical Daily   clopidogrel  75 mg Oral Daily   enoxaparin (LOVENOX) injection  30 mg Subcutaneous Q24H   gentamicin cream  1 Application Topical Daily   insulin aspart  0-15 Units Subcutaneous TID WC   insulin aspart  0-5 Units Subcutaneous QHS   isosorbide mononitrate  60 mg Oral Daily   multivitamin  1 tablet Oral Daily   pantoprazole  40 mg Oral Daily   polyethylene glycol  17 g Oral BID   risperiDONE  0.5 mg Oral QHS   rOPINIRole  1 mg Oral Daily   sertraline  25 mg Oral Daily   sodium chloride flush  3 mL Intravenous Q12H   Continuous Infusions:  sodium chloride     dialysis solution 2.5% low-MG/low-CA     nitroGLYCERIN Stopped (07/10/22 0810)      LOS: 3 days   Charlynne Cousins  [facility]  07/11/2022, 8:13 AM

## 2022-07-11 NOTE — TOC Initial Note (Signed)
Transition of Care [facility]) - Initial/Assessment Note    Patient Details  Name: [name] MRN: [id number] Date of Birth: 01-Aug-1950  Transition of Care [facility]) CM/SW Contact:    Bjorn Pippin, LCSW Phone Number: 07/11/2022, 3:19 PM  Clinical Narrative:                 CSW met with pt at bedside. CSW discussed the recommendation for SNF. Pt is refusing SNF and stated he wants to go home. CSW inquired about home health services. Pt reported that he has home health services set up with [facility]. Pt also explained that he is on diaylsys and does it at home, that being another reason he is refusing SNF.  Pt wife called while CSW was in room. Pt wife mentioned the thought of pt going to [facility] in [place]. Pt then seemed to be unsure of what he wanted to do.   CSW will complete fl2 and fax referral to [facility] SNF. TOC will continue to follow this admission.   Expected Discharge Plan: Skilled Nursing Facility Barriers to Discharge: Continued Medical Work up   Patient Goals and CMS Choice Patient states their goals for this hospitalization and ongoing recovery are:: As of right now, pt does states that he wants to go home. He mentioned having home health PT   Choice offered to / list presented to : NA      Expected Discharge Plan and Services In-house Referral: NA Discharge Planning Services: CM Consult Post Acute Care Choice: NA Living arrangements for the past 2 months: Single Family Home                 DME Arranged: N/A DME Agency: NA       HH Arranged: NA          Prior Living Arrangements/Services Living arrangements for the past 2 months: Single Family Home Lives with:: Spouse Patient language and need for interpreter reviewed:: Yes Do you feel safe going back to the place where you live?: Yes      Need for Family Participation in Patient Care: Yes (Comment) Care giver support system in place?: Yes (comment)   Criminal Activity/Legal Involvement  Pertinent to Current Situation/Hospitalization: No - Comment as needed  Activities of Daily Living Home Assistive Devices/Equipment: Eyeglasses, Wheelchair ADL Screening (condition at time of admission) Patient's cognitive ability adequate to safely complete daily activities?: Yes Is the patient deaf or have difficulty hearing?: No Does the patient have difficulty seeing, even when wearing glasses/contacts?: No Does the patient have difficulty concentrating, remembering, or making decisions?: No Patient able to express need for assistance with ADLs?: Yes  Permission Sought/Granted                  Emotional Assessment Appearance:: Appears stated age Attitude/Demeanor/Rapport: Engaged Affect (typically observed): Calm Orientation: : Oriented to Self, Oriented to Place, Oriented to  Time, Oriented to Situation Alcohol / Substance Use: Not Applicable Psych Involvement: No (comment)  Admission diagnosis:  NSTEMI (non-ST elevated myocardial infarction) [facility]) [I21.4] Chest pain [R07.9] Patient Active Problem List   Diagnosis Date Noted   Unstable angina ([place]) 07/08/2022   Abdominal pain 04/09/2022   NSTEMI (non-ST elevated myocardial infarction) ([place]) 10/10/2021   Restless leg syndrome 10/10/2021   Depression 10/10/2021   Obesity (BMI 30-39.9) 10/10/2021   Erectile dysfunction due to arterial insufficiency 06/06/2021   Colitis 10/08/2020   OAB (overactive bladder) 06/16/2020   Recurrent UTI 06/16/2020   Incomplete emptying  of bladder 03/17/2020   Benign prostatic hyperplasia with urinary obstruction 03/17/2020   Difficulty urinating 02/16/2020   History of non-ST elevation myocardial infarction (NSTEMI) 07/22/2019   Renal mass 07/14/2019   Renal anasarca    ESRD (end stage renal disease) ([place]) 04/10/2019   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   Diffuse wheezing 12/02/2017   OSA and COPD overlap syndrome ([place]) 12/02/2017   Sleeps in sitting position due to  orthopnea 12/02/2017   Pacemaker 12/02/2017   Bradycardia with 31-40 beats per minute 12/02/2017   Coronary artery disease due to calcified coronary lesion 12/02/2017   Insomnia due to medical condition 12/02/2017   Snoring 12/02/2017   Chronic diastolic CHF (congestive heart failure) ([place]) 05/10/2017   Atypical chest pain    Chest pain 05/09/2017   Acute renal failure superimposed on stage 4 chronic kidney disease ([place]) 05/09/2017   Hypokalemia 04/05/2016   Diabetes mellitus with nephropathy ([place]) 04/05/2016   Hyponatremia 04/05/2016   Hematuria 04/05/2016   Diarrhea 04/05/2016   Bilateral diabetic foot ulcer associated with secondary diabetes mellitus ([place]) 04/05/2016   Streptococcal bacteremia 04/05/2016   Cellulitis of leg, left 04/05/2016   Sepsis ([place]) 04/04/2016   CAD (coronary artery disease) 11/10/2015   Bilateral lower extremity edema 07/25/2015   Foot infection 09/27/2014   Diabetic foot infection ([place]) 09/27/2014   Diabetes mellitus with renal manifestations, uncontrolled ([place])    Benign essential HTN    ED (erectile dysfunction) 06/09/2012   Hypertension 03/11/2011   OSTEOMYELITIS, ACUTE, ANKLE/FOOT 07/24/2010   EDEMA 02/15/2010   MORBID OBESITY 07/05/2009   TOBACCO ABUSE 07/05/2009   OBSTRUCTIVE SLEEP APNEA 07/05/2009   COPD (chronic obstructive pulmonary disease) ([place]) 07/05/2009   AODM 03/30/2008   Mixed hyperlipidemia 03/30/2008   Generalized anxiety disorder 03/30/2008   BUERGER'S DISEASE 03/30/2008   PCP:  Tobe Sos, MD Pharmacy:   [facility] [address], [place] - [address] [address] Phone: [phone] Fax: [phone]  [facility] [address] [place] [address] Phone: [phone] Fax: [phone]  [place], [place] [address] [place] [address] Phone: [phone] Fax:  [phone]     Social Determinants of Health (SDOH) Social History: SDOH Screenings   Depression [phone]): Medium Risk (04/03/2020)  Tobacco Use: Medium Risk (07/09/2022)   SDOH Interventions:     Readmission Risk Interventions    07/10/2022    2:52 PM  Readmission Risk Prevention Plan  Transportation Screening Complete  PCP or Specialist Appt within 3-5 Days Complete  HRI or Home Care Consult Complete  Palliative Care Screening Not Applicable  Medication Review (RN Care Manager) Complete   Beckey Rutter, MSW, LCSWA, LCASA Transitions of Care  Clinical Social Worker I

## 2022-07-11 NOTE — Progress Notes (Signed)
Remote pacemaker transmission.   

## 2022-07-12 DIAGNOSIS — R0789 Other chest pain: Secondary | ICD-10-CM | POA: Diagnosis not present

## 2022-07-12 DIAGNOSIS — N186 End stage renal disease: Secondary | ICD-10-CM | POA: Diagnosis not present

## 2022-07-12 DIAGNOSIS — I4892 Unspecified atrial flutter: Secondary | ICD-10-CM | POA: Diagnosis not present

## 2022-07-12 LAB — RENAL FUNCTION PANEL
Albumin: 2.5 g/dL — ABNORMAL LOW (ref 3.5–5.0)
Anion gap: 13 (ref 5–15)
BUN: 63 mg/dL — ABNORMAL HIGH (ref 8–23)
CO2: 24 mmol/L (ref 22–32)
Calcium: 8.8 mg/dL — ABNORMAL LOW (ref 8.9–10.3)
Chloride: 96 mmol/L — ABNORMAL LOW (ref 98–111)
Creatinine, Ser: 6.18 mg/dL — ABNORMAL HIGH (ref 0.61–1.24)
GFR, Estimated: 9 mL/min — ABNORMAL LOW (ref >60)
Glucose, Bld: 167 mg/dL — ABNORMAL HIGH (ref 70–99)
Phosphorus: 4.7 mg/dL — ABNORMAL HIGH (ref 2.5–4.6)
Potassium: 2.9 mmol/L — ABNORMAL LOW (ref 3.5–5.1)
Sodium: 133 mmol/L — ABNORMAL LOW (ref 135–145)

## 2022-07-12 LAB — CBC
HCT: 30 % — ABNORMAL LOW (ref 39.0–52.0)
Hemoglobin: 10.5 g/dL — ABNORMAL LOW (ref 13.0–17.0)
MCH: 36.3 pg — ABNORMAL HIGH (ref 26.0–34.0)
MCHC: 35 g/dL (ref 30.0–36.0)
MCV: 103.8 fL — ABNORMAL HIGH (ref 80.0–100.0)
Platelets: 126 10*3/uL — ABNORMAL LOW (ref 150–400)
RBC: 2.89 MIL/uL — ABNORMAL LOW (ref 4.22–5.81)
RDW: 14.1 % (ref 11.5–15.5)
WBC: 10.9 10*3/uL — ABNORMAL HIGH (ref 4.0–10.5)
nRBC: 0 % (ref 0.0–0.2)

## 2022-07-12 LAB — GLUCOSE, CAPILLARY
Glucose-Capillary: 152 mg/dL — ABNORMAL HIGH (ref 70–99)
Glucose-Capillary: 197 mg/dL — ABNORMAL HIGH (ref 70–99)
Glucose-Capillary: 193 mg/dL — ABNORMAL HIGH (ref 70–99)
Glucose-Capillary: 139 mg/dL — ABNORMAL HIGH (ref 70–99)

## 2022-07-12 MED ORDER — OXYCODONE-ACETAMINOPHEN 5-325 MG PO TABS
1.0000 | ORAL_TABLET | ORAL | Status: AC | PRN
  Administered 2022-07-12 – 2022-07-13 (×4): 2 via ORAL
  Filled 2022-07-12 (×4): qty 2

## 2022-07-12 MED ORDER — ISOSORBIDE MONONITRATE ER 60 MG PO TB24: 60.0000 mg | ORAL_TABLET | Freq: Every day | ORAL | Status: DC

## 2022-07-12 NOTE — Discharge Summary (Signed)
Physician Discharge Summary  [name] [id number] DOB: 01-09-1951 DOA: 07/07/2022  PCP: Tobe Sos, MD  Admit date: 07/07/2022 Discharge date: 07/12/2022  Admitted From: Home Disposition:  SNF  Recommendations for Outpatient Follow-up:  Follow up with cardiology in 1-2 weeks Please obtain BMP/CBC in one week   Home Health:No Equipment/Devices:None  Discharge Condition:Stable CODE STATUS:Full Diet recommendation: Heart Healthy   Brief/Interim Summary: 72 y.o. male past medical history of CAD, AAA, aortic atherosclerosis, atrial flutter, coronary artery disease end-stage renal disease on peritoneal dialysis, COPD, diabetes mellitus type 2 comes into the ED with chief complaint of chest pain that started 2 weeks prior to admission had a cath in 2021 showed significant disease they were unsuccessful in placing a stent so medical management was recommended on aspirin and statins beta-blocker and Plavix.  Cardiology consulted underwent cardiac cath that showed large unchanged coronary artery disease with chronic total occlusion of the inferior branch diagonal.  Moderate in-stent restenosis of proximal right coronary artery cardiology recommended medical therapy.   Discharge Diagnoses:  Principal Problem:   Chest pain Active Problems:   Mixed hyperlipidemia   COPD (chronic obstructive pulmonary disease) (HCC)   CAD (coronary artery disease)   Hypokalemia   Diabetes mellitus with nephropathy (HCC)   Hyponatremia   Chronic diastolic CHF (congestive heart failure) (HCC)   ESRD (end stage renal disease) ([place])   Depression   Unstable angina ([place])  NSTEMI: Cardiology was consulted he status post cardiac cath that showed no good target lesion for PCI cardiology recommended medical therapy. He was started on Imdur and continue on DAPT therapy. Palliative care was consulted and discussed with him and family the risk and benefits they said they would like a second opinion. He  was continue on aspirin statins Coreg Plavix and Imdur.  Chronic atrial fibrillation/atrial flutter: Not on anticoagulation due to history of GI bleed AVMs in June 2022. Hemoglobin has remained stable and DAPT therapy.  Chronic systolic heart failure: No changes made to his medication for the management cardiology as an outpatient.  Sinus bradycardia: Status post pacemaker rate control.  Renal disease on peritoneal dialysis: Renal was consulted will continue dialysis per renal daily.  Anemia of chronic disease: Hemoglobin relatively stable.  Metabolic bone disease: Continue PhosLo.  Hyponatremia: Chronic remained stable.  Diabetes mellitus type 2 with nephropathy: Changes made to his medication he was treated in house with sliding scale insulin.  COPD: Continue home inhalers no changes made to his medication.  Hyperlipidemia: Continue statins.  Class I morbid obesity: Has been counseled further discussion per PCP.  Goals of care: Made a DNR by palliative care.  Discharge Instructions  Discharge Instructions     Amb Referral to Cardiac Rehabilitation   Complete by: As directed    Diagnosis: NSTEMI   After initial evaluation and assessments completed: Virtual Based Care may be provided alone or in conjunction with Phase 2 Cardiac Rehab based on patient barriers.: Yes   Intensive Cardiac Rehabilitation (ICR) [place] location only OR Traditional Cardiac Rehabilitation (TCR) *If criteria for ICR are not met will enroll in TCR [facility] only): Yes   Diet - low sodium heart healthy   Complete by: As directed    Increase activity slowly   Complete by: As directed    No wound care   Complete by: As directed       Allergies as of 07/12/2022       Reactions   Hydrocodone Itching   Hydrocodone-acetaminophen Itching   Other  Opioids cause hallucinations and delusions has to be given risperidone to settle down Other reaction(s): hallucinations/delirium Opiates  (Uncoded) Reaction:  Serotonin Syndrome   Oxycodone-acetaminophen Other (See Comments)        Medication List     STOP taking these medications    amLODipine 5 MG tablet Commonly known as: NORVASC   methylPREDNISolone 4 MG Tbpk tablet Commonly known as: MEDROL DOSEPAK       TAKE these medications    acetaminophen 325 MG tablet Commonly known as: TYLENOL Take 650 mg by mouth every 6 (six) hours as needed for mild pain or moderate pain.   albuterol 108 (90 Base) MCG/ACT inhaler Commonly known as: VENTOLIN HFA Inhale 2 puffs into the lungs every 6 (six) hours as needed for wheezing or shortness of breath.   aspirin EC 81 MG tablet Take 1 tablet (81 mg total) by mouth daily.   atorvastatin 40 MG tablet Commonly known as: LIPITOR Take 1 tablet by mouth once daily What changed: when to take this   beclomethasone 80 MCG/ACT inhaler Commonly known as: QVAR Inhale 2 puffs into the lungs daily as needed (wheezing and shortness of breath).   bumetanide 2 MG tablet Commonly known as: BUMEX Take 2 mg by mouth 2 (two) times daily.   calcitRIOL 0.25 MCG capsule Commonly known as: ROCALTROL Take 0.25 mcg by mouth every Monday, Wednesday, and Friday.   CALCIUM CITRATE PO Take 600 mg by mouth in the morning and at bedtime.   carvedilol 25 MG tablet Commonly known as: COREG Take 1 tablet (25 mg total) by mouth 2 (two) times daily with a meal.   clobetasol 0.05 % external solution Commonly known as: TEMOVATE Apply 1 application topically 2 (two) times daily as needed (itching).   denosumab 60 MG/ML Sosy injection Commonly known as: PROLIA Inject 60 mg into the skin every 6 (six) months.   gentamicin cream 0.1 % Commonly known as: GARAMYCIN Apply 1 Application topically daily.   isosorbide mononitrate 60 MG 24 hr tablet Commonly known as: IMDUR Take 1 tablet (60 mg total) by mouth daily.   linaclotide 290 MCG Caps capsule Commonly known as: LINZESS Take 145-290  mcg by mouth daily as needed (Constipation).   magnesium oxide 400 (240 Mg) MG tablet Commonly known as: MAG-OX Take 400 mg by mouth daily.   Melatonin 10 MG Tbdp Take 10 mg by mouth daily.   multivitamin Tabs tablet Take 1 tablet by mouth daily.   nitroGLYCERIN 0.4 MG SL tablet Commonly known as: NITROSTAT Place 1 tablet (0.4 mg total) under the tongue every 5 (five) minutes as needed for chest pain.   pantoprazole 40 MG tablet Commonly known as: Protonix Take 1 tablet (40 mg total) by mouth daily.   Plavix 75 MG tablet Generic drug: clopidogrel Take 75 mg by mouth daily.   polyethylene glycol 17 g packet Commonly known as: MIRALAX / GLYCOLAX Take 17 g by mouth daily as needed for mild constipation or moderate constipation.   potassium chloride SA 20 MEQ tablet Commonly known as: KLOR-CON M Take 20 mEq by mouth every other day.   risperiDONE 0.5 MG tablet Commonly known as: RISPERDAL Take 0.5 mg by mouth at bedtime.   rOPINIRole 1 MG tablet Commonly known as: REQUIP Take 1 mg by mouth at bedtime.   sertraline 25 MG tablet Commonly known as: ZOLOFT Take 25 mg by mouth in the morning.   Vitamin D3 50 MCG (2000 UT) Tabs Generic drug: Cholecalciferol Take 2,000  Units by mouth daily.        Follow-up Information     Pradhan, Tilden Fossa, MD Follow up.   Specialty: Internal Medicine Why: GO: FEB. 28 AT 12:45PM Contact information: [place] [address] [phone]                Allergies  Allergen Reactions   Hydrocodone Itching   Hydrocodone-Acetaminophen Itching   Other     Opioids cause hallucinations and delusions has to be given risperidone to settle down Other reaction(s): hallucinations/delirium  Opiates (Uncoded) Reaction:  Serotonin Syndrome     Oxycodone-Acetaminophen Other (See Comments)    Consultations: Nephrology Cardiology   Procedures/Studies: CARDIAC CATHETERIZATION  Result Date: 07/08/2022   Mid LM to  Dist LM lesion is 25% stenosed.   Mid LAD lesion is 40% stenosed.   Ost Cx to Prox Cx lesion is 100% stenosed.   Prox RCA lesion is 50% stenosed.   Dist RCA lesion is 60% stenosed.   1st Diag-2 lesion is 100% stenosed.   1st Diag-1 lesion is 80% stenosed. 1.  Largely unchanged coronary artery disease with chronic total occlusion of inferior branch of bifurcating diagonal and chronic total occlusion of the proximal left circumflex.  2.  Moderate in-stent restenosis of proximal right coronary artery stent with an associated RFR of 0.94. 3.  Moderate distal right coronary artery lesion with an RFR of 0.91. 4.  LVEDP of 18 mmHg. Recommendation: The images and RFR data were reviewed with Dr. Martinique the team C attending; medical therapy will be pursued.   DG Chest Port 1 View  Result Date: 07/07/2022 CLINICAL DATA:  72 year old male with history of chest pain and weakness. EXAM: PORTABLE CHEST 1 VIEW COMPARISON:  Chest x-ray 04/09/2022. FINDINGS: Lung volumes are normal. No consolidative airspace disease. No pleural effusions. No pneumothorax. Cephalization of the pulmonary vasculature, without frank pulmonary edema. Mild cardiomegaly. Upper mediastinal contours are within normal limits. Atherosclerotic calcifications are noted in the thoracic aorta. Left-sided pacemaker device in place with lead tips projecting over the expected location of the right atrium and right ventricle. IMPRESSION: 1. Cardiomegaly with pulmonary venous congestion, but no frank pulmonary edema. 2. Aortic atherosclerosis. Electronically Signed   By: Vinnie Langton M.D.   On: 07/07/2022 17:40   (Echo, Carotid, EGD, Colonoscopy, ERCP)    Subjective: No complaints  Discharge Exam: Vitals:   07/12/22 0710 07/12/22 0852  BP: (!) 101/90   Pulse: (!) 59   Resp: 12   Temp: 98.2 F (36.8 C)   SpO2: 92% 95%   Vitals:   07/11/22 2308 07/12/22 0420 07/12/22 0710 07/12/22 0852  BP: (!) 125/94 125/64 (!) 101/90   Pulse: 61 (!) 59 (!) 59    Resp: '20 17 12   '$ Temp: 98.9 F (37.2 C) 98.2 F (36.8 C) 98.2 F (36.8 C)   TempSrc: Axillary Oral Oral   SpO2: 94% 93% 92% 95%  Weight:      Height:        General: Pt is alert, awake, not in acute distress Cardiovascular: RRR, S1/S2 +, no rubs, no gallops Respiratory: CTA bilaterally, no wheezing, no rhonchi Abdominal: Soft, NT, ND, bowel sounds + Extremities: no edema, no cyanosis    The results of significant diagnostics from this hospitalization (including imaging, microbiology, ancillary and laboratory) are listed below for reference.     Microbiology: No results found for this or any previous visit (from the past 240 hour(s)).   Labs: BNP (last 3  results) Recent Labs    04/10/22 0451  BNP [id number]*   Basic Metabolic Panel: Recent Labs  Lab 07/07/22 1704 07/08/22 0040 07/11/22 0115 07/12/22 0122  NA 122* 126* 133* 133*  K 3.4* 4.1 3.0* 2.9*  CL 88* 89* 94* 96*  CO2 '22 23 24 24  '$ GLUCOSE 307* 279* 193* 167*  BUN 62* 67* 68* 63*  CREATININE 5.26* 5.40* 6.09* 6.18*  CALCIUM 8.9 9.2 8.9 8.8*  MG  --  2.3  --   --   PHOS  --   --  4.8* 4.7*   Liver Function Tests: Recent Labs  Lab 07/07/22 1704 07/08/22 0040 07/11/22 0115 07/12/22 0122  AST 22 25  --   --   ALT 16 16  --   --   ALKPHOS 99 111  --   --   BILITOT 0.2* 0.6  --   --   PROT 6.7 6.6  --   --   ALBUMIN 2.9* 2.8* 2.5* 2.5*   No results for input(s): "LIPASE", "AMYLASE" in the last 168 hours. No results for input(s): "AMMONIA" in the last 168 hours. CBC: Recent Labs  Lab 07/07/22 1704 07/08/22 0040 07/09/22 0055 07/10/22 0100 07/11/22 0115 07/12/22 0122  WBC 13.8* 15.1* 12.9* 11.0* 12.2* 10.9*  NEUTROABS 11.7*  --   --   --   --   --   HGB 11.7* 11.6* 11.0* 10.8* 11.2* 10.5*  HCT 34.4* 33.0* 33.2* 31.2* 31.9* 30.0*  MCV 103.3* 101.5* 104.7* 104.0* 102.6* 103.8*  PLT 175 176 174 157 153 126*   Cardiac Enzymes: No results for input(s): "CKTOTAL", "CKMB", "CKMBINDEX", "TROPONINI"  in the last 168 hours. BNP: Invalid input(s): "POCBNP" CBG: Recent Labs  Lab 07/11/22 0613 07/11/22 1054 07/11/22 1554 07/11/22 2246 07/12/22 0602  GLUCAP 187* 147* 158* 162* 152*   D-Dimer No results for input(s): "DDIMER" in the last 72 hours. Hgb A1c No results for input(s): "HGBA1C" in the last 72 hours. Lipid Profile Recent Labs    07/10/22 0100  CHOL 95  HDL 38*  LDLCALC 32  TRIG 124  CHOLHDL 2.5   Thyroid function studies No results for input(s): "TSH", "T4TOTAL", "T3FREE", "THYROIDAB" in the last 72 hours.  Invalid input(s): "FREET3" Anemia work up No results for input(s): "VITAMINB12", "FOLATE", "FERRITIN", "TIBC", "IRON", "RETICCTPCT" in the last 72 hours. Urinalysis    Component Value Date/Time   COLORURINE YELLOW 07/22/2019 1625   APPEARANCEUR Clear 12/14/2021 0820   LABSPEC 1.010 07/22/2019 1625   PHURINE 6.0 07/22/2019 1625   GLUCOSEU Trace (A) 12/14/2021 0820   HGBUR NEGATIVE 07/22/2019 1625   BILIRUBINUR Negative 12/14/2021 0820   KETONESUR NEGATIVE 07/22/2019 1625   PROTEINUR 2+ (A) 12/14/2021 0820   PROTEINUR >=300 (A) 07/22/2019 1625   UROBILINOGEN 0.2 09/27/2014 0027   NITRITE Negative 12/14/2021 0820   NITRITE NEGATIVE 07/22/2019 1625   LEUKOCYTESUR Negative 12/14/2021 0820   LEUKOCYTESUR NEGATIVE 07/22/2019 1625   Sepsis Labs Recent Labs  Lab 07/09/22 0055 07/10/22 0100 07/11/22 0115 07/12/22 0122  WBC 12.9* 11.0* 12.2* 10.9*   Microbiology No results found for this or any previous visit (from the past 240 hour(s)).   SIGNED:   Charlynne Cousins, MD  [facility] 07/12/2022, 9:07 AM Pager   If 7PM-7AM, please contact night-coverage [email or web address] Password [email or web address]

## 2022-07-12 NOTE — Evaluation (Signed)
Occupational Therapy Evaluation Patient Details Name: [name] MRN: [id number] DOB: Feb 21, 1951 Today's Date: 07/12/2022   History of Present Illness Pt is 72 y.o. male admitted 04/09/22  to the ER at [facility] with abdominal pain concerning for peritonitis, transferred to [facility]. Pt with a history of ESRD on PD, CAD, HF with preserved EF, COPD, PD, and HTN   Clinical Impression   Pt currently at a min assist level for simulated LB selfcare and transfers to the bedside recliner from the EOB.  Decreased endurance with pt only tolerating standing for approximately one min during peri cleaning at bedside.  BP slightly elevated at 137/101 but HR in the 60s and oxygen at 94% on room air.  Feel he will benefit from acute care OT at this time to help increase endurance and ADL independence.  Recommend HHOT eval for safety as pt will need to return back there secondary to peritoneal dialysis.  Unsure how much assistance spouse can provide as per pt, she had abdominal surgery today as well.  Will continue to follow.        Recommendations for follow up therapy are one component of a multi-disciplinary discharge planning process, led by the attending physician.  Recommendations may be updated based on patient status, additional functional criteria and insurance authorization.   Follow Up Recommendations  Home health OT     Assistance Recommended at Discharge Frequent or constant Supervision/Assistance  Patient can return home with the following A little help with walking and/or transfers;A little help with bathing/dressing/bathroom;Assistance with cooking/housework;Direct supervision/assist for medications management    Functional Status Assessment  Patient has had a recent decline in their functional status and demonstrates the ability to make significant improvements in function in a reasonable and predictable amount of time.  Equipment Recommendations  None recommended by OT        Precautions / Restrictions Precautions Precautions: Fall Precaution Comments: peritoneal dialysis daily Restrictions Weight Bearing Restrictions: No      Mobility Bed Mobility Overal bed mobility: Independent, Needs Assistance Bed Mobility: Supine to Sit     Supine to sit: Supervision     General bed mobility comments: pt OOB in recliner at start and end of session    Transfers Overall transfer level: Needs assistance Equipment used: Rolling walker (2 wheels) Transfers: Sit to/from Stand, Bed to chair/wheelchair/BSC Sit to Stand: Min assist     Step pivot transfers: Min assist     General transfer comment: Min instructional cueing for hand placement as pt wants to pull up on the RW with both hands.      Balance Overall balance assessment: Needs assistance Sitting-balance support: Feet supported, No upper extremity supported Sitting balance-Leahy Scale: Good       Standing balance-Leahy Scale: Fair Standing balance comment: Pt needs UE support on the RW for transfers                           ADL either performed or assessed with clinical judgement   ADL Overall ADL's : Needs assistance/impaired Eating/Feeding: Independent;Sitting   Grooming: Wash/dry hands;Wash/dry face;Sitting;Set up   Upper Body Bathing: Set up;Sitting   Lower Body Bathing: Minimal assistance;Sit to/from stand   Upper Body Dressing : Set up;Sitting   Lower Body Dressing: Minimal assistance;Sit to/from stand Lower Body Dressing Details (indicate cue type and reason): simulated Toilet Transfer: Minimal assistance;Stand-pivot;Ambulation;Rolling walker (2 wheels) Toilet Transfer Details (indicate cue type and reason): simulated, pt declined need  to toilet Toileting- Clothing Manipulation and Hygiene: Minimal assistance;Sit to/from stand Toileting - Clothing Manipulation Details (indicate cue type and reason): simulated     Functional mobility during ADLs: Minimal  assistance;Rolling walker (2 wheels) (step pivot to the bedside recliner) General ADL Comments: Pt with decreased overall endurance.  Only able to stand for less than 1 min during washing of front and back peri area before needing to sit down secondary to fatigue.  BP 137/101 with HR in the 60s and oxygen sats at 94% on room air.Marland Kitchen  He reports having some kind of seat in the walk-in shower but was unable to state whether it was built in or one you put in and out.  He may have just been referring to grab bars but not sure.  Spouse in the hospital with abdominal surgery today per pt report.  Pt cannot do SNF because of periotoneal dialysis so will need to discharge home.  Unsure how much assist spouse can provide post surgery.     Vision Baseline Vision/History: 1 Wears glasses Ability to See in Adequate Light: 0 Adequate Patient Visual Report: No change from baseline Vision Assessment?: No apparent visual deficits     Perception  Not tested   Praxis  Not tested    Pertinent Vitals/Pain Pain Assessment Pain Assessment: Faces Faces Pain Scale: Hurts a little bit Pain Location: lower back pain Pain Descriptors / Indicators: Discomfort Pain Intervention(s): Limited activity within patient's tolerance, Repositioned     Hand Dominance Right   Extremity/Trunk Assessment Upper Extremity Assessment Upper Extremity Assessment: Generalized weakness       Cervical / Trunk Assessment Cervical / Trunk Assessment: Kyphotic   Communication Communication Communication: No difficulties   Cognition Arousal/Alertness: Awake/alert Behavior During Therapy: WFL for tasks assessed/performed Overall Cognitive Status: No family/caregiver present to determine baseline cognitive functioning                                 General Comments: Pt with some word finding difficulty when attempting to discuss shower seat.                Home Living Family/patient expects to be discharged  to:: Private residence Living Arrangements: Spouse/significant other Available Help at Discharge: Family;Available 24 hours/day Type of Home: House Home Access: Level entry     Home Layout: One level     Bathroom Shower/Tub: Occupational psychologist: Handicapped height Bathroom Accessibility: Yes   Home Equipment: Rolling Walker (2 wheels);Grab bars - tub/shower;Grab bars - toilet;Shower seat          Prior Functioning/Environment Prior Level of Function : Independent/Modified Independent             Mobility Comments: RW ADLs Comments: Independent        OT Problem List: Decreased strength;Impaired balance (sitting and/or standing);Decreased activity tolerance;Decreased knowledge of use of DME or AE;Pain      OT Treatment/Interventions: Self-care/ADL training;Balance training;Therapeutic activities;DME and/or AE instruction;Energy conservation;Patient/family education    OT Goals(Current goals can be found in the care plan section) Acute Rehab OT Goals Patient Stated Goal: Pt did not state this session. OT Goal Formulation: With patient Time For Goal Achievement: 07/26/22 Potential to Achieve Goals: Good  OT Frequency: Min 2X/week       AM-PAC OT "6 Clicks" Daily Activity     Outcome Measure Help from another person eating meals?: None Help from another person taking  care of personal grooming?: A Little Help from another person toileting, which includes using toliet, bedpan, or urinal?: A Little Help from another person bathing (including washing, rinsing, drying)?: A Little Help from another person to put on and taking off regular upper body clothing?: A Little Help from another person to put on and taking off regular lower body clothing?: A Little 6 Click Score: 19   End of Session Equipment Utilized During Treatment: Rolling walker (2 wheels) Nurse Communication: Mobility status  Activity Tolerance: Patient limited by fatigue Patient left: in  chair;with call bell/phone within reach;with chair alarm set  OT Visit Diagnosis: Unsteadiness on feet (R26.81);Muscle weakness (generalized) (M62.81);Pain Pain - part of body:  (back)                Time: 1327-1401 OT Time Calculation (min): 34 min Charges:  OT General Charges $OT Visit: 1 Visit OT Evaluation $OT Eval Moderate Complexity: 1 Mod OT Treatments $Self Care/Home Management : 8-22 mins Clyda Greener, OTR/L [place]  Office [phone] 07/12/2022

## 2022-07-12 NOTE — Progress Notes (Signed)
Physical Therapy Treatment Patient Details Name: [name] MRN: [id number] DOB: 1950/12/16 Today's Date: 07/12/2022   History of Present Illness Pt is 72 y.o. male admitted 04/09/22  to the ER at [facility] with abdominal pain concerning for peritonitis, transferred to [facility]. Pt with a history of ESRD on PD, CAD, HF with preserved EF, COPD, PD, and HTN.    PT Comments    Patient making slow progress with mobility and limited by pain and fatigue. Pt completed sit<>stand with Mod assist from low recliner height and increased SOB with power up and rise, RR up to 48 bpm max. Pt completed 2 stands with chair height elevated to improve pt's ability to transfer. Attempted small forward steps but pt unable to take more than 1 shuffled step forward with RW due to fatigue. Educated on pursed lip breathing for recovery. RR in 10's-20's at rest. HR stable throughout. Discussed concerns regarding returning home with assist from spouse only given patients limited CLOF. If pt returns home he will likely need a hospital bed, possible Stedy for safe transfers, a BSC, and to maximize [facility] services.   Recommendations for follow up therapy are one component of a multi-disciplinary discharge planning process, led by the attending physician.  Recommendations may be updated based on patient status, additional functional criteria and insurance authorization.  Follow Up Recommendations  Skilled nursing-short term rehab (<3 hours/day) (pt unable to go SNF due to PD, will need to maximize [facility] services if returns home with assist from spouse) Can patient physically be transported by private vehicle: No   Assistance Recommended at Discharge Frequent or constant Supervision/Assistance  Patient can return home with the following Two people to help with bathing/dressing/bathroom;Assistance with cooking/housework;Assist for transportation;Help with stairs or ramp for entrance;Two people to help with walking and/or  transfers;Direct supervision/assist for medications management   Equipment Recommendations  Hospital bed;BSC/3in1 (Lift/Stedy for home transfers?)    Recommendations for Other Services       Precautions / Restrictions Precautions Precautions: Fall Precaution Comments: peritoneal dialysis daily Restrictions Weight Bearing Restrictions: No     Mobility  Bed Mobility               General bed mobility comments: pt OOB in recliner at start and end of session    Transfers Overall transfer level: Needs assistance Equipment used: Rolling walker (2 wheels) Transfers: Sit to/from Stand Sit to Stand: Mod assist           General transfer comment: pt completed 2x sit<>stand from recliner, RR up to 48 bpm and pt moaning/groaning on each power up and once standing. Cues required for pursed lip breathing to recover. pt stood for ~30 seconds each time. Attempted forward steps with RW from recliner, pt had difficulty lifting feet from floor and scooted slightly forwrad before returning to sit. Chair height elevated with blankets at EOS to improve ease of sit<>stand for return to bed with RN staff.    Ambulation/Gait               General Gait Details: limited by abdominal and back pain and weakness   Stairs             Wheelchair Mobility    Modified Rankin (Stroke Patients Only)       Balance Overall balance assessment: Needs assistance Sitting-balance support: Feet supported, Bilateral upper extremity supported Sitting balance-Leahy Scale: Fair     Standing balance support: During functional activity, Reliant on assistive device for balance, Bilateral  upper extremity supported Standing balance-Leahy Scale: Poor                              Cognition Arousal/Alertness: Awake/alert Behavior During Therapy: Flat affect Overall Cognitive Status: Within Functional Limits for tasks assessed                                  General Comments: pt slow with responses at time, falt affect.        Exercises      General Comments        Pertinent Vitals/Pain      Home Living Family/patient expects to be discharged to:: Private residence Living Arrangements: Spouse/significant other Available Help at Discharge: Family;Available 24 hours/day Type of Home: House Home Access: Level entry       Home Layout: One level Home Equipment: Rolling Walker (2 wheels);Grab bars - tub/shower;Grab bars - toilet;Shower seat      Prior Function            PT Goals (current goals can now be found in the care plan section) Acute Rehab PT Goals Patient Stated Goal: To get back home PT Goal Formulation: With patient Time For Goal Achievement: 07/25/22 Potential to Achieve Goals: Good Progress towards PT goals: Progressing toward goals (slow, limited by fatigue and pain)    Frequency    Min 3X/week      PT Plan Current plan remains appropriate;Discharge plan needs to be updated    Co-evaluation              AM-PAC PT "6 Clicks" Mobility   Outcome Measure  Help needed turning from your back to your side while in a flat bed without using bedrails?: A Little Help needed moving from lying on your back to sitting on the side of a flat bed without using bedrails?: A Little Help needed moving to and from a bed to a chair (including a wheelchair)?: A Lot Help needed standing up from a chair using your arms (e.g., wheelchair or bedside chair)?: A Lot Help needed to walk in hospital room?: A Lot Help needed climbing 3-5 steps with a railing? : Total 6 Click Score: 13    End of Session Equipment Utilized During Treatment: Gait belt Activity Tolerance: Patient limited by fatigue Patient left: in chair;with call bell/phone within reach;with chair alarm set Nurse Communication: Mobility status PT Visit Diagnosis: Other abnormalities of gait and mobility (R26.89);Muscle weakness (generalized) (M62.81)      Time: [id number] PT Time Calculation (min) (ACUTE ONLY): 25 min  Charges:  $Therapeutic Activity: 8-22 mins                     Verner Mould, DPT Acute Rehabilitation Services Office [phone]  07/12/22 3:37 PM

## 2022-07-12 NOTE — Progress Notes (Signed)
[name] Progress Note  Subjective: seen in room, no c/o's   Vitals:   07/12/22 0800 07/12/22 0852 07/12/22 1152 07/12/22 1200  BP: 115/60  (!) 101/53   Pulse: 60  62   Resp: 20  17   Temp:   97.8 F (36.6 C)   TempSrc:   Oral   SpO2: 92% 95% 94%   Weight:    121.1 kg  Height:        Exam: Gen alert, no distress, obese No jvd or bruits Chest clear bilat to bases RRR no MRG Abd soft ntnd no mass or ascites +bs Ext no LE edema Neuro is alert, Ox 3 , nf    PD cath in abd, LUA AVF in place      Home meds include - bumex '2mg'$  bid, rocaltrol 0.25 mcg po mwf, norvasc 5, aspirin, lipitor, coreg 25 bid, proliz, protonix, plavix, klor con 20 qod, risperdal, requip, zoloft, prns/ vits/ supps     OP PD: f/b [facility], gets PD through [facility] in [place]  Per the wife --> 4 exchanges overnight, 3000 fill, 1.5 hr dwell, use greens/ yellows mostly   Dry wt is 269 lbs/ 122kg   CXR - CM, poss vasc congestion, no edema    Assessment/ Plan: Chest pain - hx of CAD. LHC done yesterday, pt has lots of diffuse disease w/o good targets for stenting. Medical Rx per cardiology recs.  ESRD - on PD, lives in [place], [place]. PD nightly while here.  HTN/ volume - initial CXR mild vasc congestion. LVEDP w/ cath was 18, euvolemic per cardiology notes. Got down to dry wt here. Euvolemic today.  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  MBD ckd - CCa in range. Cont home po vdra.  Hyponatremia - resolved w/ PD/ vol removal, down 5 kg here and close to dry wt now DM 2 HTN - takes coreg/ norvasc at home Dispo - pt cannot be transferred to a SNF because of his CCPD status, he can not get PD done at a SNF. Only HD can be done w/ SNF pts.  Have d/w pmd.   Kelly Splinter MD [facility] 07/12/2022, 12:54 PM  Recent Labs  Lab 07/11/22 0115 07/12/22 0122  HGB 11.2* 10.5*  ALBUMIN 2.5* 2.5*  CALCIUM 8.9 8.8*  PHOS 4.8* 4.7*  CREATININE 6.09* 6.18*  K 3.0* 2.9*    No results for  input(s): "IRON", "TIBC", "FERRITIN" in the last 168 hours. Inpatient medications:  amLODipine  5 mg Oral Daily   aspirin EC  81 mg Oral Daily   atorvastatin  40 mg Oral Daily   budesonide  2 mL Inhalation BID   calcium citrate  200 mg of elemental calcium Oral Daily   carvedilol  25 mg Oral BID WC   Chlorhexidine Gluconate Cloth  6 each Topical Daily   clopidogrel  75 mg Oral Daily   enoxaparin (LOVENOX) injection  30 mg Subcutaneous Q24H   gentamicin cream  1 Application Topical Daily   insulin aspart  0-15 Units Subcutaneous TID WC   insulin aspart  0-5 Units Subcutaneous QHS   isosorbide mononitrate  60 mg Oral Daily   multivitamin  1 tablet Oral Daily   pantoprazole  40 mg Oral Daily   polyethylene glycol  17 g Oral BID   risperiDONE  0.5 mg Oral QHS   rOPINIRole  1 mg Oral Daily   sertraline  25 mg Oral Daily   sodium chloride  flush  3 mL Intravenous Q12H    sodium chloride     dialysis solution 1.5% low-MG/low-CA     nitroGLYCERIN Stopped (07/10/22 0810)   sodium chloride, acetaminophen, acetaminophen, albuterol, alum & mag hydroxide-simeth, HYDROmorphone (DILAUDID) injection, magnesium hydroxide, nitroGLYCERIN, ondansetron (ZOFRAN) IV, ondansetron (ZOFRAN) IV, sodium chloride flush

## 2022-07-12 NOTE — Plan of Care (Signed)
  Problem: Education: Goal: Ability to describe self-care measures that may prevent or decrease complications (Diabetes Survival Skills Education) will improve Outcome: Not Progressing Goal: Individualized Educational Video(s) Outcome: Not Progressing   Problem: Coping: Goal: Ability to adjust to condition or change in health will improve Outcome: Not Progressing   Problem: Fluid Volume: Goal: Ability to maintain a balanced intake and output will improve Outcome: Not Progressing   Problem: Health Behavior/Discharge Planning: Goal: Ability to identify and utilize available resources and services will improve Outcome: Not Progressing Goal: Ability to manage health-related needs will improve Outcome: Not Progressing   Problem: Metabolic: Goal: Ability to maintain appropriate glucose levels will improve Outcome: Not Progressing   Problem: Nutritional: Goal: Maintenance of adequate nutrition will improve Outcome: Not Progressing Goal: Progress toward achieving an optimal weight will improve Outcome: Not Progressing   Problem: Skin Integrity: Goal: Risk for impaired skin integrity will decrease Outcome: Not Progressing   Problem: Tissue Perfusion: Goal: Adequacy of tissue perfusion will improve Outcome: Not Progressing   Problem: Education: Goal: Understanding of cardiac disease, CV risk reduction, and recovery process will improve Outcome: Not Progressing Goal: Individualized Educational Video(s) Outcome: Not Progressing   Problem: Activity: Goal: Ability to tolerate increased activity will improve Outcome: Not Progressing   Problem: Cardiac: Goal: Ability to achieve and maintain adequate cardiovascular perfusion will improve Outcome: Not Progressing   Problem: Health Behavior/Discharge Planning: Goal: Ability to safely manage health-related needs after discharge will improve Outcome: Not Progressing   Problem: Education: Goal: Knowledge of General Education  information will improve Description: Including pain rating scale, medication(s)/side effects and non-pharmacologic comfort measures Outcome: Not Progressing   Problem: Health Behavior/Discharge Planning: Goal: Ability to manage health-related needs will improve Outcome: Not Progressing   Problem: Clinical Measurements: Goal: Ability to maintain clinical measurements within normal limits will improve Outcome: Not Progressing Goal: Will remain free from infection Outcome: Not Progressing Goal: Diagnostic test results will improve Outcome: Not Progressing Goal: Respiratory complications will improve Outcome: Not Progressing Goal: Cardiovascular complication will be avoided Outcome: Not Progressing   Problem: Activity: Goal: Risk for activity intolerance will decrease Outcome: Not Progressing   Problem: Nutrition: Goal: Adequate nutrition will be maintained Outcome: Not Progressing   Problem: Coping: Goal: Level of anxiety will decrease Outcome: Not Progressing   Problem: Elimination: Goal: Will not experience complications related to bowel motility Outcome: Not Progressing Goal: Will not experience complications related to urinary retention Outcome: Not Progressing   Problem: Pain Managment: Goal: General experience of comfort will improve Outcome: Not Progressing   Problem: Safety: Goal: Ability to remain free from injury will improve Outcome: Not Progressing   Problem: Skin Integrity: Goal: Risk for impaired skin integrity will decrease Outcome: Not Progressing   Problem: Education: Goal: Understanding of CV disease, CV risk reduction, and recovery process will improve Outcome: Not Progressing Goal: Individualized Educational Video(s) Outcome: Not Progressing   Problem: Activity: Goal: Ability to return to baseline activity level will improve Outcome: Not Progressing   Problem: Cardiovascular: Goal: Ability to achieve and maintain adequate cardiovascular  perfusion will improve Outcome: Not Progressing Goal: Vascular access site(s) Level 0-1 will be maintained Outcome: Not Progressing   Problem: Health Behavior/Discharge Planning: Goal: Ability to safely manage health-related needs after discharge will improve Outcome: Not Progressing

## 2022-07-13 DIAGNOSIS — R0789 Other chest pain: Secondary | ICD-10-CM | POA: Diagnosis not present

## 2022-07-13 LAB — CBC
HCT: 31.9 % — ABNORMAL LOW (ref 39.0–52.0)
Hemoglobin: 10.8 g/dL — ABNORMAL LOW (ref 13.0–17.0)
MCH: 35.4 pg — ABNORMAL HIGH (ref 26.0–34.0)
MCHC: 33.9 g/dL (ref 30.0–36.0)
MCV: 104.6 fL — ABNORMAL HIGH (ref 80.0–100.0)
Platelets: 159 10*3/uL (ref 150–400)
RBC: 3.05 MIL/uL — ABNORMAL LOW (ref 4.22–5.81)
RDW: 14.3 % (ref 11.5–15.5)
WBC: 11.4 10*3/uL — ABNORMAL HIGH (ref 4.0–10.5)
nRBC: 0 % (ref 0.0–0.2)

## 2022-07-13 LAB — RENAL FUNCTION PANEL
Albumin: 2.4 g/dL — ABNORMAL LOW (ref 3.5–5.0)
Anion gap: 16 — ABNORMAL HIGH (ref 5–15)
BUN: 64 mg/dL — ABNORMAL HIGH (ref 8–23)
CO2: 24 mmol/L (ref 22–32)
Calcium: 8.6 mg/dL — ABNORMAL LOW (ref 8.9–10.3)
Chloride: 93 mmol/L — ABNORMAL LOW (ref 98–111)
Creatinine, Ser: 6.09 mg/dL — ABNORMAL HIGH (ref 0.61–1.24)
GFR, Estimated: 9 mL/min — ABNORMAL LOW (ref >60)
Glucose, Bld: 167 mg/dL — ABNORMAL HIGH (ref 70–99)
Phosphorus: 5.6 mg/dL — ABNORMAL HIGH (ref 2.5–4.6)
Potassium: 3 mmol/L — ABNORMAL LOW (ref 3.5–5.1)
Sodium: 133 mmol/L — ABNORMAL LOW (ref 135–145)

## 2022-07-13 LAB — GLUCOSE, CAPILLARY
Glucose-Capillary: 133 mg/dL — ABNORMAL HIGH (ref 70–99)
Glucose-Capillary: 185 mg/dL — ABNORMAL HIGH (ref 70–99)
Glucose-Capillary: 146 mg/dL — ABNORMAL HIGH (ref 70–99)
Glucose-Capillary: 155 mg/dL — ABNORMAL HIGH (ref 70–99)

## 2022-07-13 MED ORDER — CARVEDILOL 12.5 MG PO TABS
12.5000 mg | ORAL_TABLET | Freq: Two times a day (BID) | ORAL | Status: DC
  Administered 2022-07-13 – 2022-07-23 (×15): 12.5 mg via ORAL
  Filled 2022-07-13 (×19): qty 1

## 2022-07-13 MED ORDER — DELFLEX-LC/1.5% DEXTROSE 344 MOSM/L IP SOLN: INTRAPERITONEAL | Status: DC

## 2022-07-13 MED ORDER — ORAL CARE MOUTH RINSE: 15.0000 mL | OROMUCOSAL | Status: DC | PRN

## 2022-07-13 MED ORDER — POLYETHYLENE GLYCOL 3350 17 G PO PACK
17.0000 g | PACK | Freq: Two times a day (BID) | ORAL | Status: AC
  Administered 2022-07-13 – 2022-07-15 (×3): 17 g via ORAL
  Filled 2022-07-13 (×4): qty 1

## 2022-07-13 NOTE — Progress Notes (Signed)
[facility] PROGRESS NOTE    Progress Note  TEAL OUELLETTE  [id number] DOB: 1950/12/23 DOA: 07/07/2022 PCP: Tobe Sos, MD     Brief Narrative:   [name] is an 72 y.o. male past medical history of CAD, AAA, aortic atherosclerosis, atrial flutter, coronary artery disease end-stage renal disease on peritoneal dialysis, COPD, diabetes mellitus type 2 comes into the ED with chief complaint of chest pain that started 2 weeks prior to admission had a cath in 2021 showed significant disease they were unsuccessful in placing a stent so medical management was recommended on aspirin and statins beta-blocker and Plavix.  Cardiology consulted underwent cardiac cath that showed large unchanged coronary artery disease with chronic total occlusion of the inferior branch diagonal.  Moderate in-stent restenosis of proximal right coronary artery cardiology recommended medical therapy.   Assessment/Plan:   NSTEMI: Status postcardiac cath no good target lesion for PCI cardiology recommended medical management they recommended to add Imdur and continue DAPT therapy. Palliative care discussed with family they would like a second opinion as an outpatient. Cardiology recommends to continue aspirin, statins, Coreg, Plavix and Imdur. Patient needs to go to a skilled nursing facility as he is on peritoneal dialysis and they are having a hard time finding a place that can handle peritoneal dialysis while getting rehab. Wife cannot take him home as she has been recently admitted.  A-fib/atrial flutter: Not anticoagulation due to history of anemia and nonbleeding AVMs in June 2022 seen on colonoscopy. Hemoglobin has remained stable while on DAPT therapy.  Heart failure with reduced EF: EDP 18 mmHg continue further management per peritoneal dialysis. Further management per nephrology.  Symptomatic bradycardia: Status post pacemaker.  End-stage renal disease on peritoneal  dialysis: Further management per nephrology.  Continue peritoneal dialysis at night. Weight is still up about 3 kg further fluid removal per nephrology.  Anemia of chronic renal disease: Hemoglobin has remained relatively stable continue to monitor intermittently.  Metabolic bone disease: Continue PhosLo BDR added.  Hyponatremia: Further management per peritoneal dialysis.  Diabetes mellitus type 2 with nephropathy: Controlled on sliding scale insulin continue current management.  COPD: Continue home inhalers.  Hyperlipidemia:  continue statins.  Class I morbid obesity: Follow-up with PCP as an outpatient will need further evaluation for sleep apnea as an outpatient.  Goals of care: Made DNR palliative care evaluation has been requested  DVT prophylaxis: scd Family Communication:Wife Status is: Inpatient Remains inpatient appropriate because: NSTEMI    Code Status:     Code Status Orders  (From admission, onward)           Start     Ordered   07/08/22 0321  Do not attempt resuscitation (DNR)  Continuous       Question Answer Comment  If patient has no pulse and is not breathing Do Not Attempt Resuscitation   If patient has a pulse and/or is breathing: Medical Treatment Goals LIMITED ADDITIONAL INTERVENTIONS: Use medication/IV fluids and cardiac monitoring as indicated; Do not use intubation or mechanical ventilation (DNI), also provide comfort medications.  Transfer to Progressive/Stepdown as indicated, avoid Intensive Care.   Consent: Discussion documented in EHR or advanced directives reviewed      07/08/22 0320           Code Status History     Date Active Date Inactive Code Status Order ID Comments User Context   07/07/2022 1946 07/08/2022 0320 Full Code [id number]  Rolla Plate, DO ED   04/09/2022 2023  04/14/2022 1852 Full Code [id number]  Idamae Schuller, MD ED   10/10/2021 0740 10/11/2021 1957 Full Code [id number]  Bernadette Hoit, DO ED    10/08/2020 2241 10/13/2020 1726 Full Code [id number]  Truett Mainland, DO Inpatient   08/02/2019 1436 08/02/2019 2130 Full Code [id number]  Waynetta Sandy, MD Inpatient   07/22/2019 2142 07/25/2019 1615 Full Code [id number]  Phillips Grout, MD ED   04/30/2019 1000 04/30/2019 1348 Full Code [id number]  Elam Dutch, MD Inpatient   04/10/2019 2050 04/13/2019 1425 DNR [id number]  Dewayne Hatch, MD ED   03/18/2018 2324 03/19/2018 1358 Full Code [id number]  Rosetta Posner, MD Inpatient   05/09/2017 1706 05/10/2017 2039 Full Code [id number]  Truett Mainland, DO Inpatient   04/04/2016 1949 04/08/2016 1806 Full Code [id number]  Orvan Falconer, MD Inpatient   09/27/2014 0146 09/28/2014 1733 DNR [id number]  Oswald Hillock, MD Inpatient         IV Access:   Peripheral IV   Procedures and diagnostic studies:   No results found.   Medical Consultants:   None.   Subjective:    [name] no complaints  Objective:    Vitals:   07/12/22 2015 07/13/22 0000 07/13/22 0400 07/13/22 0639  BP:  108/72 117/85 117/85  Pulse:  60 70 70  Resp:  14 16   Temp:  97.8 F (36.6 C) 97.9 F (36.6 C) 98.3 F (36.8 C)  TempSrc:  Oral Oral Oral  SpO2: 95% 94% 94%   Weight:   120.3 kg   Height:       SpO2: 94 % O2 Flow Rate (L/min): 2 L/min FiO2 (%): 21 %   Intake/Output Summary (Last 24 hours) at 07/13/2022 0754 Last data filed at 07/13/2022 0500 Gross per 24 hour  Intake 590 ml  Output 200 ml  Net 390 ml    Filed Weights   07/11/22 1804 07/12/22 1200 07/13/22 0400  Weight: 118.9 kg 121.1 kg 120.3 kg    Exam: General exam: In no acute distress. Respiratory system: Good air movement and clear to auscultation. Cardiovascular system: S1 & S2 heard, RRR. No JVD. Gastrointestinal system: Abdomen is nondistended, soft and nontender.  Extremities: No pedal edema. Skin: No rashes, lesions or ulcers Psychiatry: Judgement and insight appear normal. Mood & affect  appropriate.  Data Reviewed:    Labs: Basic Metabolic Panel: Recent Labs  Lab 07/07/22 1704 07/08/22 0040 07/11/22 0115 07/12/22 0122 07/13/22 0108  NA 122* 126* 133* 133* 133*  K 3.4* 4.1 3.0* 2.9* 3.0*  CL 88* 89* 94* 96* 93*  CO2 '22 23 24 24 24  '$ GLUCOSE 307* 279* 193* 167* 167*  BUN 62* 67* 68* 63* 64*  CREATININE 5.26* 5.40* 6.09* 6.18* 6.09*  CALCIUM 8.9 9.2 8.9 8.8* 8.6*  MG  --  2.3  --   --   --   PHOS  --   --  4.8* 4.7* 5.6*    GFR Estimated Creatinine Clearance: 16.2 mL/min (A) (by C-G formula based on SCr of 6.09 mg/dL (H)). Liver Function Tests: Recent Labs  Lab 07/07/22 1704 07/08/22 0040 07/11/22 0115 07/12/22 0122 07/13/22 0108  AST 22 25  --   --   --   ALT 16 16  --   --   --   ALKPHOS 99 111  --   --   --   BILITOT 0.2* 0.6  --   --   --   PROT 6.7 6.6  --   --   --  ALBUMIN 2.9* 2.8* 2.5* 2.5* 2.4*    No results for input(s): "LIPASE", "AMYLASE" in the last 168 hours. No results for input(s): "AMMONIA" in the last 168 hours. Coagulation profile No results for input(s): "INR", "PROTIME" in the last 168 hours. COVID-19 Labs  No results for input(s): "DDIMER", "FERRITIN", "LDH", "CRP" in the last 72 hours.  Lab Results  Component Value Date   SARSCOV2NAA NEGATIVE 04/09/2022   [place] NEGATIVE 10/08/2020   [place] NEGATIVE 09/20/2019   [place] NEGATIVE 07/30/2019    CBC: Recent Labs  Lab 07/07/22 1704 07/08/22 0040 07/09/22 0055 07/10/22 0100 07/11/22 0115 07/12/22 0122 07/13/22 0108  WBC 13.8*   < > 12.9* 11.0* 12.2* 10.9* 11.4*  NEUTROABS 11.7*  --   --   --   --   --   --   HGB 11.7*   < > 11.0* 10.8* 11.2* 10.5* 10.8*  HCT 34.4*   < > 33.2* 31.2* 31.9* 30.0* 31.9*  MCV 103.3*   < > 104.7* 104.0* 102.6* 103.8* 104.6*  PLT 175   < > 174 157 153 126* 159   < > = values in this interval not displayed.    Cardiac Enzymes: No results for input(s): "CKTOTAL", "CKMB", "CKMBINDEX", "TROPONINI" in the last 168  hours. BNP (last 3 results) No results for input(s): "PROBNP" in the last 8760 hours. CBG: Recent Labs  Lab 07/12/22 0602 07/12/22 1151 07/12/22 1720 07/12/22 2131 07/13/22 0634  GLUCAP 152* 197* 193* 139* 133*    D-Dimer: No results for input(s): "DDIMER" in the last 72 hours. Hgb A1c: No results for input(s): "HGBA1C" in the last 72 hours. Lipid Profile: No results for input(s): "CHOL", "HDL", "LDLCALC", "TRIG", "CHOLHDL", "LDLDIRECT" in the last 72 hours.  Thyroid function studies: No results for input(s): "TSH", "T4TOTAL", "T3FREE", "THYROIDAB" in the last 72 hours.  Invalid input(s): "FREET3" Anemia work up: No results for input(s): "VITAMINB12", "FOLATE", "FERRITIN", "TIBC", "IRON", "RETICCTPCT" in the last 72 hours. Sepsis Labs: Recent Labs  Lab 07/10/22 0100 07/11/22 0115 07/12/22 0122 07/13/22 0108  WBC 11.0* 12.2* 10.9* 11.4*    Microbiology No results found for this or any previous visit (from the past 240 hour(s)).   Medications:    amLODipine  5 mg Oral Daily   aspirin EC  81 mg Oral Daily   atorvastatin  40 mg Oral Daily   budesonide  2 mL Inhalation BID   calcium citrate  200 mg of elemental calcium Oral Daily   carvedilol  25 mg Oral BID WC   Chlorhexidine Gluconate Cloth  6 each Topical Daily   clopidogrel  75 mg Oral Daily   enoxaparin (LOVENOX) injection  30 mg Subcutaneous Q24H   gentamicin cream  1 Application Topical Daily   insulin aspart  0-15 Units Subcutaneous TID WC   insulin aspart  0-5 Units Subcutaneous QHS   isosorbide mononitrate  60 mg Oral Daily   multivitamin  1 tablet Oral Daily   pantoprazole  40 mg Oral Daily   risperiDONE  0.5 mg Oral QHS   rOPINIRole  1 mg Oral Daily   sertraline  25 mg Oral Daily   sodium chloride flush  3 mL Intravenous Q12H   Continuous Infusions:  sodium chloride     dialysis solution 1.5% low-MG/low-CA     nitroGLYCERIN Stopped (07/10/22 0810)      LOS: 5 days   Charlynne Cousins  [facility]  07/13/2022, 7:54 AM

## 2022-07-13 NOTE — Progress Notes (Addendum)
Physical Therapy Treatment Patient Details Name: [name] MRN: [id number] DOB: 1950/10/10 Today's Date: 07/13/2022   History of Present Illness Pt is 72 y.o. male admitted 04/09/22  to the ER at [facility] with abdominal pain concerning for peritonitis, transferred to [facility]. Pt with a history of ESRD on PD, CAD, HF with preserved EF, COPD, PD, and HTN    PT Comments    Pt received in bed, agreeable to participation in therapy. He required min assist supine to sit, mod assist sit to stand, and min assist SPT with RW. Significantly increased time required to complete all mobility skills.  Pt moaning in pain and having increased RR with all movement. SpO2 and HR stable. Pt in recliner with feet elevated at end of session. Per pt and MD progress note, pt's wife is now hospitalized in [place], [place]. Therefore, home with assist is not an option.   Recommendations for follow up therapy are one component of a multi-disciplinary discharge planning process, led by the attending physician.  Recommendations may be updated based on patient status, additional functional criteria and insurance authorization.  Follow Up Recommendations  Skilled nursing-short term rehab (<3 hours/day) Can patient physically be transported by private vehicle: No   Assistance Recommended at Discharge Frequent or constant Supervision/Assistance  Patient can return home with the following Two people to help with bathing/dressing/bathroom;Assistance with cooking/housework;Assist for transportation;Help with stairs or ramp for entrance;Two people to help with walking and/or transfers;Direct supervision/assist for medications management   Equipment Recommendations  Hospital bed;BSC/3in1;Wheelchair (measurements PT)    Recommendations for Other Services       Precautions / Restrictions Precautions Precautions: Fall;Other (comment) Precaution Comments: peritoneal dialysis daily     Mobility  Bed Mobility Overal bed  mobility: Needs Assistance Bed Mobility: Supine to Sit     Supine to sit: Min assist     General bed mobility comments: +rail, increased time    Transfers Overall transfer level: Needs assistance Equipment used: Rolling walker (2 wheels) Transfers: Sit to/from Stand Sit to Stand: Mod assist   Step pivot transfers: Min assist       General transfer comment: increased time, assist to power up, step pivot with RW bed to recliner    Ambulation/Gait               General Gait Details: unable due to pain and fatigue   Stairs             Wheelchair Mobility    Modified Rankin (Stroke Patients Only)       Balance Overall balance assessment: Needs assistance Sitting-balance support: Feet supported, No upper extremity supported Sitting balance-Leahy Scale: Good     Standing balance support: During functional activity, Reliant on assistive device for balance, Bilateral upper extremity supported Standing balance-Leahy Scale: Poor                              Cognition Arousal/Alertness: Awake/alert Behavior During Therapy: Flat affect Overall Cognitive Status: No family/caregiver present to determine baseline cognitive functioning                                 General Comments: following simple commands with increased time. Minimally interactive with therapist        Exercises      General Comments General comments (skin integrity, edema, etc.): SpOw and HR stable. Increased RR  with mobility. Cues fo relaxation and slow breathing.      Pertinent Vitals/Pain Pain Assessment Pain Assessment: Faces Faces Pain Scale: Hurts whole lot Pain Location: abdomen with mobility Pain Descriptors / Indicators: Grimacing, Discomfort, Moaning Pain Intervention(s): Limited activity within patient's tolerance, Monitored during session, Repositioned    Home Living                          Prior Function            PT  Goals (current goals can now be found in the care plan section) Acute Rehab PT Goals Patient Stated Goal: home Progress towards PT goals: Progressing toward goals    Frequency    Min 3X/week      PT Plan Current plan remains appropriate    Co-evaluation              AM-PAC PT "6 Clicks" Mobility   Outcome Measure  Help needed turning from your back to your side while in a flat bed without using bedrails?: A Little Help needed moving from lying on your back to sitting on the side of a flat bed without using bedrails?: A Little Help needed moving to and from a bed to a chair (including a wheelchair)?: A Little Help needed standing up from a chair using your arms (e.g., wheelchair or bedside chair)?: A Lot Help needed to walk in hospital room?: A Lot Help needed climbing 3-5 steps with a railing? : Total 6 Click Score: 14    End of Session Equipment Utilized During Treatment: Gait belt Activity Tolerance: Patient limited by pain;Patient limited by fatigue Patient left: in chair;with call bell/phone within reach Nurse Communication: Mobility status PT Visit Diagnosis: Other abnormalities of gait and mobility (R26.89);Muscle weakness (generalized) (M62.81)     Time: [id number] PT Time Calculation (min) (ACUTE ONLY): 29 min  Charges:  $Therapeutic Activity: 23-37 mins                     Gloriann Loan., PT  Office # [phone]    Lorriane Shire 07/13/2022, 10:25 AM

## 2022-07-13 NOTE — Progress Notes (Addendum)
[name] Progress Note  Subjective: seen in room, no c/o's   Vitals:   07/13/22 0400 07/13/22 0639 07/13/22 0838 07/13/22 1157  BP: 117/85 117/85 120/67 (!) 100/51  Pulse: 70 70 60 60  Resp: 16  (!) 22 20  Temp: 97.9 F (36.6 C) 98.3 F (36.8 C) 97.7 F (36.5 C) 97.8 F (36.6 C)  TempSrc: Oral Oral Oral Oral  SpO2: 94%  97% 98%  Weight: 120.3 kg     Height:        Exam: Gen alert, no distress, obese No jvd or bruits Chest clear bilat to bases RRR no MRG Abd soft ntnd no mass or ascites +bs Ext no LE edema Neuro is alert, Ox 3 , nf    PD cath in abd, LUA AVF in place      Home meds include - bumex '2mg'$  bid, rocaltrol 0.25 mcg po mwf, norvasc 5, aspirin, lipitor, coreg 25 bid, proliz, protonix, plavix, klor con 20 qod, risperdal, requip, zoloft, prns/ vits/ supps     OP PD: f/b [facility], gets PD through [facility] in [place]  Per the wife --> 4 exchanges overnight, 3000 fill, 1.5 hr dwell, use greens/ yellows mostly   Dry wt is 269 lbs/ 122kg   CXR - CM, poss vasc congestion, no edema    Assessment/ Plan: Chest pain - hx of CAD. LHC done, pt has lots of diffuse disease w/ no good targets for stenting. Medical Rx per cardiology recs.  ESRD - on PD, lives in [place], [place]. PD nightly while here.  HTN/ volume - initial CXR mild vasc congestion. LVEDP w/ cath was 18, euvolemic per cards note. Is slightly under dry wt now, euvolemic on exam Anemia esrd - Hb 11-12 here, no esa needs.  MBD ckd - CCa in range. Cont home po vdra. HTN - BP's are low and wt's are down, will dc norvasc and lower coreg dosing. May need some IVF"s if not eating/ drinking well.  Dispo - pt cannot be transferred to a SNF because of his CCPD status, we can not get PD done at a SNF. If he goes home he can do his PD. If he has to go to SNF, fortunately he has LUA AVF that we can use, and we would just need to get him placed in an outpatient unit in [place], which our SW can work on  starting monday.   Kelly Splinter MD [facility] 07/13/2022, 1:33 PM  Recent Labs  Lab 07/12/22 0122 07/13/22 0108  HGB 10.5* 10.8*  ALBUMIN 2.5* 2.4*  CALCIUM 8.8* 8.6*  PHOS 4.7* 5.6*  CREATININE 6.18* 6.09*  K 2.9* 3.0*    No results for input(s): "IRON", "TIBC", "FERRITIN" in the last 168 hours. Inpatient medications:  amLODipine  5 mg Oral Daily   aspirin EC  81 mg Oral Daily   atorvastatin  40 mg Oral Daily   budesonide  2 mL Inhalation BID   calcium citrate  200 mg of elemental calcium Oral Daily   carvedilol  25 mg Oral BID WC   Chlorhexidine Gluconate Cloth  6 each Topical Daily   clopidogrel  75 mg Oral Daily   enoxaparin (LOVENOX) injection  30 mg Subcutaneous Q24H   gentamicin cream  1 Application Topical Daily   insulin aspart  0-15 Units Subcutaneous TID WC   insulin aspart  0-5 Units Subcutaneous QHS   isosorbide mononitrate  60 mg Oral Daily   multivitamin  1 tablet Oral Daily  pantoprazole  40 mg Oral Daily   risperiDONE  0.5 mg Oral QHS   rOPINIRole  1 mg Oral Daily   sertraline  25 mg Oral Daily   sodium chloride flush  3 mL Intravenous Q12H    sodium chloride     dialysis solution 1.5% low-MG/low-CA     nitroGLYCERIN Stopped (07/10/22 0810)   sodium chloride, acetaminophen, acetaminophen, albuterol, alum & mag hydroxide-simeth, magnesium hydroxide, nitroGLYCERIN, ondansetron (ZOFRAN) IV, ondansetron (ZOFRAN) IV, oxyCODONE-acetaminophen, sodium chloride flush

## 2022-07-13 NOTE — Plan of Care (Signed)
  Problem: Education: Goal: Ability to describe self-care measures that may prevent or decrease complications (Diabetes Survival Skills Education) will improve Outcome: Progressing   Problem: Coping: Goal: Ability to adjust to condition or change in health will improve Outcome: Progressing   Problem: Fluid Volume: Goal: Ability to maintain a balanced intake and output will improve Outcome: Progressing   Problem: Health Behavior/Discharge Planning: Goal: Ability to identify and utilize available resources and services will improve Outcome: Progressing Goal: Ability to manage health-related needs will improve Outcome: Progressing   Problem: Nutritional: Goal: Progress toward achieving an optimal weight will improve Outcome: Progressing   Problem: Skin Integrity: Goal: Risk for impaired skin integrity will decrease Outcome: Progressing   Problem: Tissue Perfusion: Goal: Adequacy of tissue perfusion will improve Outcome: Progressing   Problem: Activity: Goal: Ability to tolerate increased activity will improve Outcome: Progressing   Problem: Health Behavior/Discharge Planning: Goal: Ability to safely manage health-related needs after discharge will improve Outcome: Progressing   Problem: Education: Goal: Knowledge of General Education information will improve Description: Including pain rating scale, medication(s)/side effects and non-pharmacologic comfort measures Outcome: Progressing   Problem: Clinical Measurements: Goal: Will remain free from infection Outcome: Progressing Goal: Diagnostic test results will improve Outcome: Progressing Goal: Respiratory complications will improve Outcome: Progressing Goal: Cardiovascular complication will be avoided Outcome: Progressing   Problem: Activity: Goal: Risk for activity intolerance will decrease Outcome: Progressing   Problem: Elimination: Goal: Will not experience complications related to bowel motility Outcome:  Progressing Goal: Will not experience complications related to urinary retention Outcome: Progressing   Problem: Pain Managment: Goal: General experience of comfort will improve Outcome: Progressing   Problem: Safety: Goal: Ability to remain free from injury will improve Outcome: Progressing

## 2022-07-13 NOTE — Progress Notes (Signed)
PD post treatment note  PD treatment completed. Patient tolerated treatment well. PD effluent is clear. No specimen collected.  PD exit site clean, dry and intact. Patient is awake, oriented and in no acute distress.  Report given to bedside nurse.   Post treatment VS: see table below    07/13/22 0639  Vitals  Temp 98.3 F (36.8 C)  Temp Source Oral  BP 117/85  MAP (mmHg) 97  Pulse Rate 70  MEWS COLOR  MEWS Score Color Green  MEWS Score  MEWS Temp 0  MEWS Systolic 0  MEWS Pulse 0  MEWS RR 0  MEWS LOC 0  MEWS Score 0    Total UF removed:  401 ml  Post treatment weight:  121kg

## 2022-07-14 DIAGNOSIS — R0789 Other chest pain: Secondary | ICD-10-CM | POA: Diagnosis not present

## 2022-07-14 LAB — CBC
HCT: 33 % — ABNORMAL LOW (ref 39.0–52.0)
Hemoglobin: 10.9 g/dL — ABNORMAL LOW (ref 13.0–17.0)
MCH: 35 pg — ABNORMAL HIGH (ref 26.0–34.0)
MCHC: 33 g/dL (ref 30.0–36.0)
MCV: 106.1 fL — ABNORMAL HIGH (ref 80.0–100.0)
Platelets: 158 10*3/uL (ref 150–400)
RBC: 3.11 MIL/uL — ABNORMAL LOW (ref 4.22–5.81)
RDW: 14.4 % (ref 11.5–15.5)
WBC: 11.8 10*3/uL — ABNORMAL HIGH (ref 4.0–10.5)
nRBC: 0.2 % (ref 0.0–0.2)

## 2022-07-14 LAB — GLUCOSE, CAPILLARY
Glucose-Capillary: 112 mg/dL — ABNORMAL HIGH (ref 70–99)
Glucose-Capillary: 176 mg/dL — ABNORMAL HIGH (ref 70–99)
Glucose-Capillary: 126 mg/dL — ABNORMAL HIGH (ref 70–99)
Glucose-Capillary: 123 mg/dL — ABNORMAL HIGH (ref 70–99)

## 2022-07-14 LAB — BASIC METABOLIC PANEL
Anion gap: 14 (ref 5–15)
BUN: 61 mg/dL — ABNORMAL HIGH (ref 8–23)
CO2: 24 mmol/L (ref 22–32)
Calcium: 8.7 mg/dL — ABNORMAL LOW (ref 8.9–10.3)
Chloride: 95 mmol/L — ABNORMAL LOW (ref 98–111)
Creatinine, Ser: 6.66 mg/dL — ABNORMAL HIGH (ref 0.61–1.24)
GFR, Estimated: 8 mL/min — ABNORMAL LOW (ref >60)
Glucose, Bld: 169 mg/dL — ABNORMAL HIGH (ref 70–99)
Potassium: 3.2 mmol/L — ABNORMAL LOW (ref 3.5–5.1)
Sodium: 133 mmol/L — ABNORMAL LOW (ref 135–145)

## 2022-07-14 MED ORDER — DELFLEX-LC/1.5% DEXTROSE 344 MOSM/L IP SOLN
INTRAPERITONEAL | Status: DC
  Administered 2022-07-15: 6000 mL via INTRAPERITONEAL

## 2022-07-14 NOTE — Progress Notes (Signed)
[facility] PROGRESS NOTE    Progress Note  [name]  [id number] DOB: 10-May-1951 DOA: 07/07/2022 PCP: Tobe Sos, MD     Brief Narrative:   [name] is an 72 y.o. male past medical history of CAD, AAA, aortic atherosclerosis, atrial flutter, coronary artery disease end-stage renal disease on peritoneal dialysis, COPD, diabetes mellitus type 2 comes into the ED with chief complaint of chest pain that started 2 weeks prior to admission had a cath in 2021 showed significant disease they were unsuccessful in placing a stent so medical management was recommended on aspirin and statins beta-blocker and Plavix.  Cardiology consulted underwent cardiac cath that showed large unchanged coronary artery disease with chronic total occlusion of the inferior branch diagonal.  Moderate in-stent restenosis of proximal right coronary artery cardiology recommended medical therapy.   Assessment/Plan:   NSTEMI: Status postcardiac cath no good target lesion for PCI cardiology recommended medical management they recommended to add Imdur and continue DAPT therapy. Palliative care discussed with family they would like a second opinion as an outpatient. Cardiology recommends to continue aspirin, statins, Coreg, Plavix and Imdur. Patient needs to go to a skilled nursing facility as he is on peritoneal dialysis and they are having a hard time finding a place that can handle peritoneal dialysis while getting rehab. Wife cannot take him home as she has been recently admitted. PT to cont. to work with the patient daily.  A-fib/atrial flutter: Not anticoagulation due to history of anemia and nonbleeding AVMs in June 2022 seen on colonoscopy. Hemoglobin has remained stable while on DAPT therapy.  Heart failure with reduced EF: EDP 18 mmHg continue further management per peritoneal dialysis. Further management per nephrology.  Symptomatic bradycardia: Status post  pacemaker.  End-stage renal disease on peritoneal dialysis: Further management per nephrology.  Continue peritoneal dialysis at night. Weight is still up about 3 kg further fluid removal per nephrology.  Anemia of chronic renal disease: Hemoglobin has remained relatively stable continue to monitor intermittently.  Metabolic bone disease: Continue PhosLo BDR added.  Hyponatremia: Further management per peritoneal dialysis.  Diabetes mellitus type 2 with nephropathy: Controlled on sliding scale insulin continue current management.  COPD: Continue home inhalers.  Hyperlipidemia:  continue statins.  Class I morbid obesity: Follow-up with PCP as an outpatient will need further evaluation for sleep apnea as an outpatient.  Goals of care: Made DNR palliative care evaluation has been requested  DVT prophylaxis: scd Family Communication:Wife Status is: Inpatient Remains inpatient appropriate because: NSTEMI    Code Status:     Code Status Orders  (From admission, onward)           Start     Ordered   07/08/22 0321  Do not attempt resuscitation (DNR)  Continuous       Question Answer Comment  If patient has no pulse and is not breathing Do Not Attempt Resuscitation   If patient has a pulse and/or is breathing: Medical Treatment Goals LIMITED ADDITIONAL INTERVENTIONS: Use medication/IV fluids and cardiac monitoring as indicated; Do not use intubation or mechanical ventilation (DNI), also provide comfort medications.  Transfer to Progressive/Stepdown as indicated, avoid Intensive Care.   Consent: Discussion documented in EHR or advanced directives reviewed      07/08/22 0320           Code Status History     Date Active Date Inactive Code Status Order ID Comments User Context   07/07/2022 1946 07/08/2022 0320 Full Code [id number]  Rolla Plate, DO ED   04/09/2022 2023 04/14/2022 1852 Full Code [id number]  Idamae Schuller, MD ED   10/10/2021 0740 10/11/2021 1957  Full Code [id number]  Bernadette Hoit, DO ED   10/08/2020 2241 10/13/2020 1726 Full Code [id number]  Truett Mainland, DO Inpatient   08/02/2019 1436 08/02/2019 2130 Full Code [id number]  Waynetta Sandy, MD Inpatient   07/22/2019 2142 07/25/2019 1615 Full Code [id number]  Phillips Grout, MD ED   04/30/2019 1000 04/30/2019 1348 Full Code [id number]  Elam Dutch, MD Inpatient   04/10/2019 2050 04/13/2019 1425 DNR [id number]  Dewayne Hatch, MD ED   03/18/2018 2324 03/19/2018 1358 Full Code [id number]  Rosetta Posner, MD Inpatient   05/09/2017 1706 05/10/2017 2039 Full Code [id number]  Truett Mainland, DO Inpatient   04/04/2016 1949 04/08/2016 1806 Full Code [id number]  Orvan Falconer, MD Inpatient   09/27/2014 0146 09/28/2014 1733 DNR [id number]  Oswald Hillock, MD Inpatient         IV Access:   Peripheral IV   Procedures and diagnostic studies:   No results found.   Medical Consultants:   None.   Subjective:    [name] no complaints  Objective:    Vitals:   07/14/22 0455 07/14/22 0644 07/14/22 0650 07/14/22 0800  BP:  126/66  136/69  Pulse:  67  64  Resp:    20  Temp:  98.1 F (36.7 C)  (!) 97.1 F (36.2 C)  TempSrc:  Oral  Oral  SpO2:    90%  Weight: 122.4 kg  122.8 kg   Height:       SpO2: 90 % O2 Flow Rate (L/min): 6 L/min FiO2 (%): 21 %   Intake/Output Summary (Last 24 hours) at 07/14/2022 0937 Last data filed at 07/14/2022 0803 Gross per 24 hour  Intake 320 ml  Output 543 ml  Net -223 ml    Filed Weights   07/13/22 0400 07/14/22 0455 07/14/22 0650  Weight: 120.3 kg 122.4 kg 122.8 kg    Exam: General exam: In no acute distress. Respiratory system: Good air movement and clear to auscultation. Cardiovascular system: S1 & S2 heard, RRR. No JVD. Gastrointestinal system: Abdomen is nondistended, soft and nontender.  Extremities: No pedal edema. Skin: No rashes, lesions or ulcers Psychiatry: Judgement and insight appear normal. Mood &  affect appropriate.  Data Reviewed:    Labs: Basic Metabolic Panel: Recent Labs  Lab 07/08/22 0040 07/11/22 0115 07/12/22 0122 07/13/22 0108 07/14/22 0052  NA 126* 133* 133* 133* 133*  K 4.1 3.0* 2.9* 3.0* 3.2*  CL 89* 94* 96* 93* 95*  CO2 '23 24 24 24 24  '$ GLUCOSE 279* 193* 167* 167* 169*  BUN 67* 68* 63* 64* 61*  CREATININE 5.40* 6.09* 6.18* 6.09* 6.66*  CALCIUM 9.2 8.9 8.8* 8.6* 8.7*  MG 2.3  --   --   --   --   PHOS  --  4.8* 4.7* 5.6*  --     GFR Estimated Creatinine Clearance: 15 mL/min (A) (by C-G formula based on SCr of 6.66 mg/dL (H)). Liver Function Tests: Recent Labs  Lab 07/07/22 1704 07/08/22 0040 07/11/22 0115 07/12/22 0122 07/13/22 0108  AST 22 25  --   --   --   ALT 16 16  --   --   --   ALKPHOS 99 111  --   --   --   BILITOT 0.2* 0.6  --   --   --  PROT 6.7 6.6  --   --   --   ALBUMIN 2.9* 2.8* 2.5* 2.5* 2.4*    No results for input(s): "LIPASE", "AMYLASE" in the last 168 hours. No results for input(s): "AMMONIA" in the last 168 hours. Coagulation profile No results for input(s): "INR", "PROTIME" in the last 168 hours. COVID-19 Labs  No results for input(s): "DDIMER", "FERRITIN", "LDH", "CRP" in the last 72 hours.  Lab Results  Component Value Date   SARSCOV2NAA NEGATIVE 04/09/2022   [place] NEGATIVE 10/08/2020   [place] NEGATIVE 09/20/2019   [place] NEGATIVE 07/30/2019    CBC: Recent Labs  Lab 07/07/22 1704 07/08/22 0040 07/10/22 0100 07/11/22 0115 07/12/22 0122 07/13/22 0108 07/14/22 0052  WBC 13.8*   < > 11.0* 12.2* 10.9* 11.4* 11.8*  NEUTROABS 11.7*  --   --   --   --   --   --   HGB 11.7*   < > 10.8* 11.2* 10.5* 10.8* 10.9*  HCT 34.4*   < > 31.2* 31.9* 30.0* 31.9* 33.0*  MCV 103.3*   < > 104.0* 102.6* 103.8* 104.6* 106.1*  PLT 175   < > 157 153 126* 159 158   < > = values in this interval not displayed.    Cardiac Enzymes: No results for input(s): "CKTOTAL", "CKMB", "CKMBINDEX", "TROPONINI" in the last  168 hours. BNP (last 3 results) No results for input(s): "PROBNP" in the last 8760 hours. CBG: Recent Labs  Lab 07/13/22 0634 07/13/22 1155 07/13/22 1640 07/13/22 2119 07/14/22 0613  GLUCAP 133* 185* 146* 155* 112*    D-Dimer: No results for input(s): "DDIMER" in the last 72 hours. Hgb A1c: No results for input(s): "HGBA1C" in the last 72 hours. Lipid Profile: No results for input(s): "CHOL", "HDL", "LDLCALC", "TRIG", "CHOLHDL", "LDLDIRECT" in the last 72 hours.  Thyroid function studies: No results for input(s): "TSH", "T4TOTAL", "T3FREE", "THYROIDAB" in the last 72 hours.  Invalid input(s): "FREET3" Anemia work up: No results for input(s): "VITAMINB12", "FOLATE", "FERRITIN", "TIBC", "IRON", "RETICCTPCT" in the last 72 hours. Sepsis Labs: Recent Labs  Lab 07/11/22 0115 07/12/22 0122 07/13/22 0108 07/14/22 0052  WBC 12.2* 10.9* 11.4* 11.8*    Microbiology No results found for this or any previous visit (from the past 240 hour(s)).   Medications:    aspirin EC  81 mg Oral Daily   atorvastatin  40 mg Oral Daily   budesonide  2 mL Inhalation BID   calcium citrate  200 mg of elemental calcium Oral Daily   carvedilol  12.5 mg Oral BID WC   Chlorhexidine Gluconate Cloth  6 each Topical Daily   clopidogrel  75 mg Oral Daily   enoxaparin (LOVENOX) injection  30 mg Subcutaneous Q24H   gentamicin cream  1 Application Topical Daily   insulin aspart  0-15 Units Subcutaneous TID WC   insulin aspart  0-5 Units Subcutaneous QHS   isosorbide mononitrate  60 mg Oral Daily   multivitamin  1 tablet Oral Daily   pantoprazole  40 mg Oral Daily   polyethylene glycol  17 g Oral BID   risperiDONE  0.5 mg Oral QHS   rOPINIRole  1 mg Oral Daily   sertraline  25 mg Oral Daily   sodium chloride flush  3 mL Intravenous Q12H   Continuous Infusions:  sodium chloride     dialysis solution 1.5% low-MG/low-CA     nitroGLYCERIN Stopped (07/10/22 0810)      LOS: 6 days   Charlynne Cousins  [facility]  07/14/2022, 9:37 AM

## 2022-07-14 NOTE — Plan of Care (Signed)
  Problem: Education: Goal: Ability to describe self-care measures that may prevent or decrease complications (Diabetes Survival Skills Education) will improve Outcome: Progressing   Problem: Coping: Goal: Ability to adjust to condition or change in health will improve Outcome: Progressing   Problem: Fluid Volume: Goal: Ability to maintain a balanced intake and output will improve Outcome: Progressing   Problem: Health Behavior/Discharge Planning: Goal: Ability to manage health-related needs will improve Outcome: Progressing   Problem: Metabolic: Goal: Ability to maintain appropriate glucose levels will improve Outcome: Progressing   Problem: Nutritional: Goal: Maintenance of adequate nutrition will improve Outcome: Progressing Goal: Progress toward achieving an optimal weight will improve Outcome: Progressing   Problem: Cardiac: Goal: Ability to achieve and maintain adequate cardiovascular perfusion will improve Outcome: Progressing   Problem: Education: Goal: Knowledge of General Education information will improve Description: Including pain rating scale, medication(s)/side effects and non-pharmacologic comfort measures Outcome: Progressing   Problem: Activity: Goal: Ability to tolerate increased activity will improve Outcome: Not Progressing   Problem: Coping: Goal: Level of anxiety will decrease Outcome: Not Progressing   Problem: Pain Managment: Goal: General experience of comfort will improve Outcome: Not Progressing

## 2022-07-14 NOTE — Progress Notes (Signed)
[facility] Progress Note  Subjective: seen in room, no c/o's   Vitals:   07/14/22 0455 07/14/22 0644 07/14/22 0650 07/14/22 0800  BP:  126/66  136/69  Pulse:  67  64  Resp:    20  Temp:  98.1 F (36.7 C)  (!) 97.1 F (36.2 C)  TempSrc:  Oral  Oral  SpO2:    90%  Weight: 122.4 kg  122.8 kg   Height:        Exam: Gen alert, no distress, obese No jvd or bruits Chest clear bilat to bases RRR no MRG Abd soft ntnd no mass or ascites +bs Ext no LE edema Neuro is alert, Ox 3 , nf    PD cath in abd, LUA AVF in place      Home meds include - bumex '2mg'$  bid, rocaltrol 0.25 mcg po mwf, norvasc 5, aspirin, lipitor, coreg 25 bid, proliz, protonix, plavix, klor con 20 qod, risperdal, requip, zoloft, prns/ vits/ supps     OP PD: f/b [facility], gets PD through [facility] in [place]  Per the wife --> 4 exchanges overnight, 3000 fill, 1.5 hr dwell, use greens/ yellows mostly   Dry wt is 269 lbs/ 122kg   CXR - CM, poss vasc congestion, no edema    Assessment/ Plan: Chest pain - hx of CAD. LHC done, pt has lots of diffuse disease w/ no good targets for stenting. Medical Rx per cardiology recs.  ESRD - on PD, lives in [place], [place]. PD nightly while here.  HTN/ volume - initial CXR mild vasc congestion. LVEDP w/ cath was 18, euvolemic per cards note. Is right at dry wt today. Euvolemic on exam. Using all 1.5% fluids.  Anemia esrd - Hb 10-12 here, no esa needs.  MBD ckd - CCa in range. Cont home po vdra. HTN - BP's are low and wt's are down, will dc norvasc and lower coreg dosing. May need some IVF"s if not eating/ drinking well.  Dispo - pt cannot be transferred to a SNF because of his CCPD status, we can not get PD done at a SNF. If he goes home he can do his PD. If he has to go to SNF, he has LUA AVF that we can use, and we would just need to get him placed in an outpatient unit in [place], which our SW can work on starting Monday.   Kelly Splinter MD [facility] 07/14/2022,  11:38 AM  Recent Labs  Lab 07/12/22 0122 07/13/22 0108 07/14/22 0052  HGB 10.5* 10.8* 10.9*  ALBUMIN 2.5* 2.4*  --   CALCIUM 8.8* 8.6* 8.7*  PHOS 4.7* 5.6*  --   CREATININE 6.18* 6.09* 6.66*  K 2.9* 3.0* 3.2*    No results for input(s): "IRON", "TIBC", "FERRITIN" in the last 168 hours. Inpatient medications:  aspirin EC  81 mg Oral Daily   atorvastatin  40 mg Oral Daily   budesonide  2 mL Inhalation BID   calcium citrate  200 mg of elemental calcium Oral Daily   carvedilol  12.5 mg Oral BID WC   Chlorhexidine Gluconate Cloth  6 each Topical Daily   clopidogrel  75 mg Oral Daily   enoxaparin (LOVENOX) injection  30 mg Subcutaneous Q24H   gentamicin cream  1 Application Topical Daily   insulin aspart  0-15 Units Subcutaneous TID WC   insulin aspart  0-5 Units Subcutaneous QHS   isosorbide mononitrate  60 mg Oral Daily   multivitamin  1 tablet  Oral Daily   pantoprazole  40 mg Oral Daily   polyethylene glycol  17 g Oral BID   risperiDONE  0.5 mg Oral QHS   rOPINIRole  1 mg Oral Daily   sertraline  25 mg Oral Daily   sodium chloride flush  3 mL Intravenous Q12H    sodium chloride     dialysis solution 1.5% low-MG/low-CA     nitroGLYCERIN Stopped (07/10/22 0810)   sodium chloride, acetaminophen, acetaminophen, albuterol, alum & mag hydroxide-simeth, magnesium hydroxide, nitroGLYCERIN, ondansetron (ZOFRAN) IV, ondansetron (ZOFRAN) IV, mouth rinse, sodium chloride flush

## 2022-07-14 NOTE — Progress Notes (Signed)
Mobility Specialist Progress Note:   07/14/22 1200  Mobility  Activity Transferred from chair to bed  Level of Assistance Moderate assist, patient does 50-74%  Assistive Device Front wheel walker  Distance Ambulated (ft) 3 ft  Activity Response Tolerated fair  Mobility Referral Yes  $Mobility charge 1 Mobility   Pt requesting multiple times to get back to bed. Required heavy modA to stand from low recliner, minA to pivot to bed. Pt was tachypneic throughout session, suspect anxiety driven rather than distress. Left in bed with all needs met, bed alarm on. Encouraged frequent OOB mobility.  Nelta Numbers Mobility Specialist Please contact via SecureChat or  Rehab office at [phone]

## 2022-07-14 NOTE — Progress Notes (Signed)
PD post treatment note  PD treatment completed. Patient tolerated treatment well. PD effluent is clear. No specimen collected.  PD exit site clean, dry and intact. Patient is awake, oriented and in no acute distress.  Report given to bedside nurse.   Post treatment VS: see table below   07/14/22 0644  Vitals  Temp 98.1 F (36.7 C)  Temp Source Oral  BP 126/66  MAP (mmHg) 84  BP Location Right Arm  BP Method Automatic  Patient Position (if appropriate) Lying  Pulse Rate 67  Pulse Rate Source Monitor  MEWS COLOR  MEWS Score Color Green  MEWS Score  MEWS Temp 0  MEWS Systolic 0  MEWS Pulse 0  MEWS RR 0  MEWS LOC 0  MEWS Score 0     Total UF removed:  243  Post treatment weight: 122.8kg  Riccardo Dubin RN [facility]

## 2022-07-15 DIAGNOSIS — R0789 Other chest pain: Secondary | ICD-10-CM | POA: Diagnosis not present

## 2022-07-15 LAB — CBC
HCT: 32.8 % — ABNORMAL LOW (ref 39.0–52.0)
Hemoglobin: 10.8 g/dL — ABNORMAL LOW (ref 13.0–17.0)
MCH: 35 pg — ABNORMAL HIGH (ref 26.0–34.0)
MCHC: 32.9 g/dL (ref 30.0–36.0)
MCV: 106.1 fL — ABNORMAL HIGH (ref 80.0–100.0)
Platelets: 148 10*3/uL — ABNORMAL LOW (ref 150–400)
RBC: 3.09 MIL/uL — ABNORMAL LOW (ref 4.22–5.81)
RDW: 14.4 % (ref 11.5–15.5)
WBC: 9.3 10*3/uL (ref 4.0–10.5)
nRBC: 0 % (ref 0.0–0.2)

## 2022-07-15 LAB — GLUCOSE, CAPILLARY
Glucose-Capillary: 131 mg/dL — ABNORMAL HIGH (ref 70–99)
Glucose-Capillary: 139 mg/dL — ABNORMAL HIGH (ref 70–99)
Glucose-Capillary: 150 mg/dL — ABNORMAL HIGH (ref 70–99)
Glucose-Capillary: 130 mg/dL — ABNORMAL HIGH (ref 70–99)

## 2022-07-15 MED ORDER — SORBITOL 70 % SOLN
960.0000 mL | TOPICAL_OIL | Freq: Once | ORAL | Status: AC
  Administered 2022-07-15: 960 mL via RECTAL
  Filled 2022-07-15: qty 240

## 2022-07-15 MED ORDER — SORBITOL 70 % SOLN: 60.0000 mL | Freq: Once | Status: DC

## 2022-07-15 MED ORDER — POLYETHYLENE GLYCOL 3350 17 G PO PACK: 17.0000 g | PACK | Freq: Two times a day (BID) | ORAL | Status: DC

## 2022-07-15 MED ORDER — POLYETHYLENE GLYCOL 3350 17 G PO PACK
17.0000 g | PACK | Freq: Two times a day (BID) | ORAL | Status: AC
  Administered 2022-07-15 – 2022-07-17 (×2): 17 g via ORAL
  Filled 2022-07-15 (×4): qty 1

## 2022-07-15 MED ORDER — SORBITOL 70 % SOLN: 960.0000 mL | TOPICAL_OIL | Freq: Once | ORAL | Status: DC

## 2022-07-15 NOTE — Progress Notes (Signed)
[facility] PROGRESS NOTE    Progress Note  [name]  [id number] DOB: June 04, 1950 DOA: 07/07/2022 PCP: Tobe Sos, MD     Brief Narrative:   [name] is an 72 y.o. male past medical history of CAD, AAA, aortic atherosclerosis, atrial flutter, coronary artery disease end-stage renal disease on peritoneal dialysis, COPD, diabetes mellitus type 2 comes into the ED with chief complaint of chest pain that started 2 weeks prior to admission had a cath in 2021 showed significant disease they were unsuccessful in placing a stent so medical management was recommended on aspirin and statins beta-blocker and Plavix.  Status postcardiac cath no good target lesion for PCI cardiology recommended medical management they recommended to add Imdur and continue DAPT therapy. Palliative care discussed with family they would like a second opinion as an outpatient.   Assessment/Plan:   NSTEMI: Continue aspirin, statins, Coreg, Plavix and Imdur. Patient needs to go to a skilled nursing facility as he is on peritoneal dialysis and they are having a hard time finding a place that can handle peritoneal dialysis while getting rehab. Wife cannot take him home as she has been recently admitted and had a surgical intervention. PT to cont. to work with the patient daily. Start [facility] program.  A-fib/atrial flutter: Not anticoagulation due to history of anemia and nonbleeding AVMs in June 2022 seen on colonoscopy. Hemoglobin has remained stable while on DAPT therapy.  Heart failure with reduced EF: EDP 18 mmHg continue further management per peritoneal dialysis. Further management per nephrology.  Symptomatic bradycardia: Status post pacemaker.  End-stage renal disease on peritoneal dialysis: Further management per nephrology.  Continue peritoneal dialysis at night. Weight is still up about 3 kg further fluid removal per nephrology.  Anemia of chronic renal disease: Hemoglobin  has remained relatively stable continue to monitor intermittently.  Metabolic bone disease: Continue PhosLo BID added.  Hyponatremia: Further management per peritoneal dialysis.  Diabetes mellitus type 2 with nephropathy: Controlled on sliding scale insulin continue current management.  COPD: Continue home inhalers.  Hyperlipidemia:  continue statins.  Class I morbid obesity: Follow-up with PCP as an outpatient will need further evaluation for sleep apnea as an outpatient.  Goals of care: Made DNR palliative care evaluation has been requested  DVT prophylaxis: scd Family Communication:Wife Status is: Inpatient Remains inpatient appropriate because: NSTEMI    Code Status:     Code Status Orders  (From admission, onward)           Start     Ordered   07/08/22 0321  Do not attempt resuscitation (DNR)  Continuous       Question Answer Comment  If patient has no pulse and is not breathing Do Not Attempt Resuscitation   If patient has a pulse and/or is breathing: Medical Treatment Goals LIMITED ADDITIONAL INTERVENTIONS: Use medication/IV fluids and cardiac monitoring as indicated; Do not use intubation or mechanical ventilation (DNI), also provide comfort medications.  Transfer to Progressive/Stepdown as indicated, avoid Intensive Care.   Consent: Discussion documented in EHR or advanced directives reviewed      07/08/22 0320           Code Status History     Date Active Date Inactive Code Status Order ID Comments User Context   07/07/2022 1946 07/08/2022 0320 Full Code [id number]  Rolla Plate, DO ED   04/09/2022 2023 04/14/2022 1852 Full Code [id number]  Idamae Schuller, MD ED   10/10/2021 0740 10/11/2021 1957 Full Code [id number]  Bernadette Hoit, DO ED   10/08/2020 2241 10/13/2020 1726 Full Code [id number]  Truett Mainland, DO Inpatient   08/02/2019 1436 08/02/2019 2130 Full Code [id number]  Waynetta Sandy, MD Inpatient   07/22/2019 2142 07/25/2019 1615  Full Code [id number]  Phillips Grout, MD ED   04/30/2019 1000 04/30/2019 1348 Full Code [id number]  Elam Dutch, MD Inpatient   04/10/2019 2050 04/13/2019 1425 DNR [id number]  Dewayne Hatch, MD ED   03/18/2018 2324 03/19/2018 1358 Full Code [id number]  Rosetta Posner, MD Inpatient   05/09/2017 1706 05/10/2017 2039 Full Code [id number]  Truett Mainland, DO Inpatient   04/04/2016 1949 04/08/2016 1806 Full Code [id number]  Orvan Falconer, MD Inpatient   09/27/2014 0146 09/28/2014 1733 DNR [id number]  Oswald Hillock, MD Inpatient         IV Access:   Peripheral IV   Procedures and diagnostic studies:   No results found.   Medical Consultants:   None.   Subjective:    [name] no complaints  Objective:    Vitals:   07/15/22 0000 07/15/22 0400 07/15/22 0722 07/15/22 0822  BP: 131/73 115/83 (!) 114/58   Pulse: 60 70 69 60  Resp: '18 20 15   '$ Temp: 97.6 F (36.4 C) 97.6 F (36.4 C) 98.2 F (36.8 C)   TempSrc: Oral Oral Oral   SpO2: 93% 93% 90% 92%  Weight:  122.5 kg    Height:       SpO2: 92 % O2 Flow Rate (L/min): 6 L/min FiO2 (%): 21 %   Intake/Output Summary (Last 24 hours) at 07/15/2022 0920 Last data filed at 07/15/2022 0802 Gross per 24 hour  Intake 120 ml  Output 757 ml  Net -637 ml    Filed Weights   07/14/22 0455 07/14/22 0650 07/15/22 0400  Weight: 122.4 kg 122.8 kg 122.5 kg    Exam: General exam: In no acute distress. Respiratory system: Good air movement and clear to auscultation. Cardiovascular system: S1 & S2 heard, RRR. No JVD. Gastrointestinal system: Abdomen is nondistended, soft and nontender.  Extremities: No pedal edema. Skin: No rashes, lesions or ulcers Psychiatry: Judgement and insight appear normal. Mood & affect appropriate.  Data Reviewed:    Labs: Basic Metabolic Panel: Recent Labs  Lab 07/11/22 0115 07/12/22 0122 07/13/22 0108 07/14/22 0052  NA 133* 133* 133* 133*  K 3.0* 2.9* 3.0* 3.2*  CL 94* 96* 93*  95*  CO2 '24 24 24 24  '$ GLUCOSE 193* 167* 167* 169*  BUN 68* 63* 64* 61*  CREATININE 6.09* 6.18* 6.09* 6.66*  CALCIUM 8.9 8.8* 8.6* 8.7*  PHOS 4.8* 4.7* 5.6*  --     GFR Estimated Creatinine Clearance: 15 mL/min (A) (by C-G formula based on SCr of 6.66 mg/dL (H)). Liver Function Tests: Recent Labs  Lab 07/11/22 0115 07/12/22 0122 07/13/22 0108  ALBUMIN 2.5* 2.5* 2.4*    No results for input(s): "LIPASE", "AMYLASE" in the last 168 hours. No results for input(s): "AMMONIA" in the last 168 hours. Coagulation profile No results for input(s): "INR", "PROTIME" in the last 168 hours. COVID-19 Labs  No results for input(s): "DDIMER", "FERRITIN", "LDH", "CRP" in the last 72 hours.  Lab Results  Component Value Date   SARSCOV2NAA NEGATIVE 04/09/2022   SARSCOV2NAA NEGATIVE 10/08/2020   [place] NEGATIVE 09/20/2019   [place] NEGATIVE 07/30/2019    CBC: Recent Labs  Lab 07/11/22 0115 07/12/22 0122 07/13/22 0108 07/14/22 0052 07/15/22 0047  WBC 12.2* 10.9* 11.4* 11.8*  9.3  HGB 11.2* 10.5* 10.8* 10.9* 10.8*  HCT 31.9* 30.0* 31.9* 33.0* 32.8*  MCV 102.6* 103.8* 104.6* 106.1* 106.1*  PLT 153 126* 159 158 148*    Cardiac Enzymes: No results for input(s): "CKTOTAL", "CKMB", "CKMBINDEX", "TROPONINI" in the last 168 hours. BNP (last 3 results) No results for input(s): "PROBNP" in the last 8760 hours. CBG: Recent Labs  Lab 07/14/22 0613 07/14/22 1122 07/14/22 1613 07/14/22 2116 07/15/22 0615  GLUCAP 112* 176* 126* 123* 131*    D-Dimer: No results for input(s): "DDIMER" in the last 72 hours. Hgb A1c: No results for input(s): "HGBA1C" in the last 72 hours. Lipid Profile: No results for input(s): "CHOL", "HDL", "LDLCALC", "TRIG", "CHOLHDL", "LDLDIRECT" in the last 72 hours.  Thyroid function studies: No results for input(s): "TSH", "T4TOTAL", "T3FREE", "THYROIDAB" in the last 72 hours.  Invalid input(s): "FREET3" Anemia work up: No results for input(s):  "VITAMINB12", "FOLATE", "FERRITIN", "TIBC", "IRON", "RETICCTPCT" in the last 72 hours. Sepsis Labs: Recent Labs  Lab 07/12/22 0122 07/13/22 0108 07/14/22 0052 07/15/22 0047  WBC 10.9* 11.4* 11.8* 9.3    Microbiology No results found for this or any previous visit (from the past 240 hour(s)).   Medications:    aspirin EC  81 mg Oral Daily   atorvastatin  40 mg Oral Daily   budesonide  2 mL Inhalation BID   calcium citrate  200 mg of elemental calcium Oral Daily   carvedilol  12.5 mg Oral BID WC   Chlorhexidine Gluconate Cloth  6 each Topical Daily   clopidogrel  75 mg Oral Daily   enoxaparin (LOVENOX) injection  30 mg Subcutaneous Q24H   gentamicin cream  1 Application Topical Daily   insulin aspart  0-15 Units Subcutaneous TID WC   insulin aspart  0-5 Units Subcutaneous QHS   isosorbide mononitrate  60 mg Oral Daily   multivitamin  1 tablet Oral Daily   pantoprazole  40 mg Oral Daily   polyethylene glycol  17 g Oral BID   risperiDONE  0.5 mg Oral QHS   rOPINIRole  1 mg Oral Daily   sertraline  25 mg Oral Daily   sodium chloride flush  3 mL Intravenous Q12H   Continuous Infusions:  sodium chloride     dialysis solution 1.5% low-MG/low-CA     nitroGLYCERIN Stopped (07/10/22 0810)      LOS: 7 days   Charlynne Cousins  [facility]  07/15/2022, 9:20 AM

## 2022-07-15 NOTE — Progress Notes (Signed)
[facility] Progress Note    Assessment/ Plan:   Chest pain - hx of CAD. LHC done, pt has lots of diffuse disease w/ no good targets for stenting. Medical Rx per cardiology recs.  ESRD - on PD, lives in [place], [place]. PD nightly while here. , will c/w 1.5% bags tonight HTN/ volume - initial CXR mild vasc congestion. LVEDP w/ cath was 18, euvolemic per cards note. Is right at dry wt today. Euvolemic on exam. Using all 1.5% fluids.  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 Cont home po vdra. HTN - BP's are low and wt's are down, will dc norvasc and lower coreg dosing. May need some IVF"s if not eating/ drinking well.  Dispo - pt cannot be transferred to a SNF because of his CCPD status, we can not get PD done at a SNF. If he goes home he can do his PD. If he has to go to SNF, he has LUA AVF that we can use, and we would just need to get him placed in an outpatient unit in [place], which our SW can work on starting Monday.   OP PD: f/b [facility], gets PD through [facility] in [place]  Per the wife --> 4 exchanges overnight, 3000 fill, 1.5 hr dwell, use greens/ yellows mostly   Dry wt is 269 lbs/ 122kg   CXR - CM, poss vasc congestion, no edema   Subjective:   Patient seen and examined bedside. Still with ongoing chest pain but not worsening. Per RN, does get tachypnea when moving to bed to chair but O2 sats remain intact.  He reports that he tolerated PD overnight, net UF 457cc   Objective:   BP (!) 114/58 (BP Location: Right Wrist)   Pulse 60   Temp 98.2 F (36.8 C) (Oral)   Resp 15   Ht '6\' 4"'$  (1.93 m) Comment: in  Wt 122.5 kg   SpO2 92%   BMI 32.87 kg/m   Intake/Output Summary (Last 24 hours) at 07/15/2022 0856 Last data filed at 07/15/2022 0802 Gross per 24 hour  Intake 120 ml  Output 757 ml  Net -637 ml   Weight change: 0.1 kg  Physical Exam: Gen: NAD CVS: RRR Resp: CTA b/l Abd: soft, nt/nd Ext: no edema Neuro: awake,  alert Dialysis access: LUE AVF +b/t, PD cath c/d/i  Imaging: No results found.  Labs: BMET Recent Labs  Lab 07/11/22 0115 07/12/22 0122 07/13/22 0108 07/14/22 0052  NA 133* 133* 133* 133*  K 3.0* 2.9* 3.0* 3.2*  CL 94* 96* 93* 95*  CO2 '24 24 24 24  '$ GLUCOSE 193* 167* 167* 169*  BUN 68* 63* 64* 61*  CREATININE 6.09* 6.18* 6.09* 6.66*  CALCIUM 8.9 8.8* 8.6* 8.7*  PHOS 4.8* 4.7* 5.6*  --    CBC Recent Labs  Lab 07/12/22 0122 07/13/22 0108 07/14/22 0052 07/15/22 0047  WBC 10.9* 11.4* 11.8* 9.3  HGB 10.5* 10.8* 10.9* 10.8*  HCT 30.0* 31.9* 33.0* 32.8*  MCV 103.8* 104.6* 106.1* 106.1*  PLT 126* 159 158 148*    Medications:     aspirin EC  81 mg Oral Daily   atorvastatin  40 mg Oral Daily   budesonide  2 mL Inhalation BID   calcium citrate  200 mg of elemental calcium Oral Daily   carvedilol  12.5 mg Oral BID WC   Chlorhexidine Gluconate Cloth  6 each Topical Daily   clopidogrel  75 mg Oral Daily   enoxaparin (LOVENOX) injection  30 mg Subcutaneous Q24H   gentamicin cream  1 Application Topical Daily   insulin aspart  0-15 Units Subcutaneous TID WC   insulin aspart  0-5 Units Subcutaneous QHS   isosorbide mononitrate  60 mg Oral Daily   multivitamin  1 tablet Oral Daily   pantoprazole  40 mg Oral Daily   polyethylene glycol  17 g Oral BID   risperiDONE  0.5 mg Oral QHS   rOPINIRole  1 mg Oral Daily   sertraline  25 mg Oral Daily   sodium chloride flush  3 mL Intravenous Q12H      Gean Quint, MD [facility] 07/15/2022, 8:56 AM

## 2022-07-15 NOTE — Progress Notes (Signed)
Contacted by RN CM regarding pt's HD needs at d/c. Discussed with nephrologist. Pt's dialysis needs depend on pt's d/c plan. If pt returns home, then pt will likely remain PD. If pt is going to a snf at d/c, then pt will need to be changed to HD. This info provided to [facility] staff. Will assist as needed.   Melven Sartorius Renal Navigator [phone]

## 2022-07-15 NOTE — Progress Notes (Signed)
PD post treatment note  PD treatment completed. Patient tolerated treatment well. PD effluent is clear. No specimen collected.  PD exit site clean, dry and intact. Patient is awake, oriented and in no acute distress.  Report given to bedside nurse.   Post treatment VS:   Total UF removed:  457 mls  Post treatment weight:  169 lbs    07/15/22 [id number]  Peritoneal Catheter Right lower abdomen Continuous cycling  No placement date or time found.   Catheter Location: Right lower abdomen  Catheter Conversion: Extension added  Dialysis Type: Continuous cycling  Site Assessment Clean, Dry, Intact  Drainage Description None  Catheter status Deaccessed  Dressing Gauze/Drain sponge  Dressing Status Clean, Dry, Intact  Dressing Intervention Removed  Completion  Treatment Status Complete  Initial Drain Volume 80  Average Dwell Time-Hour(s) 1  Average Dwell Time-Min(s) 30  Average Drain Time 23  Total Therapy Volume 11984  Total Therapy Time-Hour(s) 8  Total Therapy Time-Min(s) 41  Weight after Drain 269 lb (122 kg)  Effluent Appearance Clear  Cell Count on Daytime Exchange N/A  Fluid Balance - CCPD  Total UF (+ value on cycler, pt loss) 457 mL  Procedure Comments  Tolerated treatment well? Yes  Peritoneal Dialysis Comments tx achieved as expected, tolerated well, no acute distress.  Education / Care Plan  Dialysis Education Provided Yes  Hand-off documentation  Hand-off Given Given to shift RN/LPN  Report given to (Full Name) Berle Mull, RN  Hand-off Received Received from shift RN/LPN  Report received from (Full Name) Namir Neto, Rn

## 2022-07-15 NOTE — Progress Notes (Signed)
Occupational Therapy Treatment Patient Details Name: [name] MRN: [id number] DOB: 01-Nov-1950 Today's Date: 07/15/2022   History of present illness Pt is 72 y.o. male admitted 04/09/22  to the ER at [facility] with abdominal pain concerning for peritonitis, transferred to [facility]. Pt with a history of ESRD on PD, CAD, HF with preserved EF, COPD, PD, and HTN   OT comments  Patient able to progress to mobility in the room this date in his room.  Patient able to walk length of the room with chair follow times two with up to [place].  Unfortunately his spouse was just admitted to an acute care facility in [place], and will not be able to care for the patient at home.  In addition, he is unable to discharge to SNF facility due to PD.  When asked, the patient is not able to transition to his son's home.  Patient's name forwarded to [facility] program to try and increase his intensity.  Obviously SNF level rehab would be perfect, but this does not appear to be a realistic disposition.  Hopefully between the mobility team, rehab and nursing, he can progress to return home with [facility] and occasional assist from family.     Recommendations for follow up therapy are one component of a multi-disciplinary discharge planning process, led by the attending physician.  Recommendations may be updated based on patient status, additional functional criteria and insurance authorization.    Follow Up Recommendations  Home health OT     Assistance Recommended at Discharge Frequent or constant Supervision/Assistance  Patient can return home with the following  A little help with walking and/or transfers;A little help with bathing/dressing/bathroom;Assistance with cooking/housework;Direct supervision/assist for medications management   Equipment Recommendations  None recommended by OT    Recommendations for Other Services      Precautions / Restrictions Precautions Precautions: Fall;Other (comment) Precaution Comments:  peritoneal dialysis daily Restrictions Weight Bearing Restrictions: No       Mobility Bed Mobility Overal bed mobility: Needs Assistance Bed Mobility: Supine to Sit     Supine to sit: Min assist     General bed mobility comments: +rail, increased time    Transfers Overall transfer level: Needs assistance Equipment used: Rolling walker (2 wheels) Transfers: Sit to/from Stand, Bed to chair/wheelchair/BSC Sit to Stand: Min assist     Step pivot transfers: Min assist           Balance Overall balance assessment: Needs assistance Sitting-balance support: Feet supported, No upper extremity supported Sitting balance-Leahy Scale: Good     Standing balance support: Reliant on assistive device for balance Standing balance-Leahy Scale: Poor                             A  Extremity/Trunk Assessment Upper Extremity Assessment Upper Extremity Assessment: Generalized weakness   Lower Extremity Assessment Lower Extremity Assessment: Defer to PT evaluation   Cervical / Trunk Assessment Cervical / Trunk Assessment: Kyphotic    Vision Baseline Vision/History: 1 Wears glasses Patient Visual Report: No change from baseline     Perception Perception Perception: Not tested   Praxis Praxis Praxis: Not tested    Cognition Arousal/Alertness: Awake/alert Behavior During Therapy: Flat affect Overall Cognitive Status: Within Functional Limits for tasks assessed  Pertinent Vitals/ Pain       Pain Assessment Pain Assessment: Faces Faces Pain Scale: Hurts a little bit Pain Location: abdomen with mobility, chronic Pain Descriptors / Indicators: Tender Pain Intervention(s): Monitored during session                                                          Frequency  Min 2X/week        Progress Toward Goals  OT Goals(current goals can now be  found in the care plan section)  Progress towards OT goals: Progressing toward goals  Acute Rehab OT Goals Patient Stated Goal: Return home OT Goal Formulation: With patient Time For Goal Achievement: 07/26/22 Potential to Achieve Goals: [place] Discharge plan remains appropriate    Co-evaluation                 AM-PAC OT "6 Clicks" Daily Activity     Outcome Measure   Help from another person eating meals?: None Help from another person taking care of personal grooming?: A Little Help from another person toileting, which includes using toliet, bedpan, or urinal?: A Little Help from another person bathing (including washing, rinsing, drying)?: A Lot Help from another person to put on and taking off regular upper body clothing?: A Little Help from another person to put on and taking off regular lower body clothing?: A Lot 6 Click Score: 17    End of Session Equipment Utilized During Treatment: Rolling walker (2 wheels);Gait belt  OT Visit Diagnosis: Unsteadiness on feet (R26.81);Muscle weakness (generalized) (M62.81)   Activity Tolerance Patient tolerated treatment well   Patient Left in chair;with call bell/phone within reach   Nurse Communication Mobility status        Time: [id number] OT Time Calculation (min): 25 min  Charges: OT General Charges $OT Visit: 1 Visit OT Treatments $Self Care/Home Management : 8-22 mins  07/15/2022  RP, OTR/L  Acute Rehabilitation Services  Office:  [phone]   Metta Clines 07/15/2022, 1:58 PM

## 2022-07-15 NOTE — Progress Notes (Signed)
Palliative-   Chart reviewed. Goals clarified at initial consult. Patient remains in patient due to disposition issues.  Patient referred for outpatient Palliative- appreciate TOC assistance. There are no acute Palliative needs at this time. Inpatient acute Palliative will sign off.   Mariana Kaufman, AGNP-C Palliative Medicine  No charge

## 2022-07-15 NOTE — Plan of Care (Signed)
  Problem: Coping: Goal: Ability to adjust to condition or change in health will improve Outcome: Progressing   Problem: Fluid Volume: Goal: Ability to maintain a balanced intake and output will improve Outcome: Progressing   Problem: Health Behavior/Discharge Planning: Goal: Ability to identify and utilize available resources and services will improve Outcome: Progressing Goal: Ability to manage health-related needs will improve Outcome: Progressing   Problem: Metabolic: Goal: Ability to maintain appropriate glucose levels will improve Outcome: Progressing   Problem: Nutritional: Goal: Progress toward achieving an optimal weight will improve Outcome: Progressing   Problem: Skin Integrity: Goal: Risk for impaired skin integrity will decrease Outcome: Progressing   Problem: Tissue Perfusion: Goal: Adequacy of tissue perfusion will improve Outcome: Progressing   Problem: Education: Goal: Understanding of cardiac disease, CV risk reduction, and recovery process will improve Outcome: Progressing   Problem: Activity: Goal: Ability to tolerate increased activity will improve Outcome: Progressing   Problem: Cardiac: Goal: Ability to achieve and maintain adequate cardiovascular perfusion will improve Outcome: Progressing   Problem: Health Behavior/Discharge Planning: Goal: Ability to safely manage health-related needs after discharge will improve Outcome: Progressing   Problem: Education: Goal: Knowledge of General Education information will improve Description: Including pain rating scale, medication(s)/side effects and non-pharmacologic comfort measures Outcome: Progressing   Problem: Health Behavior/Discharge Planning: Goal: Ability to manage health-related needs will improve Outcome: Progressing   Problem: Clinical Measurements: Goal: Ability to maintain clinical measurements within normal limits will improve Outcome: Progressing Goal: Will remain free from  infection Outcome: Progressing Goal: Diagnostic test results will improve Outcome: Progressing Goal: Respiratory complications will improve Outcome: Progressing Goal: Cardiovascular complication will be avoided Outcome: Progressing   Problem: Coping: Goal: Level of anxiety will decrease Outcome: Progressing   Problem: Pain Managment: Goal: General experience of comfort will improve Outcome: Progressing

## 2022-07-15 NOTE — TOC Progression Note (Addendum)
Transition of Care [facility]) - Progression Note    Patient Details  Name: BUELL ZERBE MRN: [id number] Date of Birth: 1950-08-08  Transition of Care [facility]) CM/SW Contact  Zenon Mayo, RN Phone Number: 07/15/2022, 2:06 PM  Clinical Narrative:    [facility] will [facility] started in [facility] per occupational therapy, patient  his wife was admitted to an acute care hospital in [place] last week. Son is unable to care for him at the son's home.  Hopefully [facility] will help him to increase his independence.  TOC following.  NCM received a call from [place] with [facility], she states she spoke with patient's wife and the wife spoke with a OP HD facility and they are switching him to HD.  This NCM informed Skylar that did not know anything about this and will contact the Renal Navigator to see if she knows anything about this.     Expected Discharge Plan: [place] Barriers to Discharge: Continued Medical Work up  Expected Discharge Plan and Services In-house Referral: NA Discharge Planning Services: CM Consult Post Acute Care Choice: NA Living arrangements for the past 2 months: Single Family Home Expected Discharge Date: 07/12/22               DME Arranged: N/A DME Agency: NA       HH Arranged: NA           Social Determinants of Health (SDOH) Interventions [place]: Low Risk  (07/13/2022)  Depression (PHQ2-9): Medium Risk (04/03/2020)  Tobacco Use: Medium Risk (07/09/2022)    Readmission Risk Interventions    07/10/2022    2:52 PM  Readmission Risk Prevention Plan  Transportation Screening Complete  PCP or Specialist Appt within 3-5 Days Complete  HRI or Home Care Consult Complete  Palliative Care Screening Not Applicable  Medication Review (RN Care Manager) Complete

## 2022-07-15 NOTE — Progress Notes (Signed)
Mobility Specialist - Progress Note   07/15/22 1500  Mobility  Activity Ambulated with assistance in room;Transferred from chair to bed  Level of Assistance Moderate assist, patient does 50-74%  Assistive Device Front wheel walker  Distance Ambulated (ft) 40 ft  Activity Response Tolerated well  Mobility Referral Yes  $Mobility charge 1 Mobility    Pt received in recliner and agreeable. ModA to stand from recliner. Progressed to MinG throughout ambulation in room. Requested to get back to bed EOS. Left in bed w/ call bell in reach and all needs met.   [place] Specialist Please contact via SecureChat or Rehab office at [phone]

## 2022-07-16 DIAGNOSIS — R0789 Other chest pain: Secondary | ICD-10-CM | POA: Diagnosis not present

## 2022-07-16 LAB — GLUCOSE, CAPILLARY
Glucose-Capillary: 125 mg/dL — ABNORMAL HIGH (ref 70–99)
Glucose-Capillary: 143 mg/dL — ABNORMAL HIGH (ref 70–99)
Glucose-Capillary: 168 mg/dL — ABNORMAL HIGH (ref 70–99)
Glucose-Capillary: 143 mg/dL — ABNORMAL HIGH (ref 70–99)

## 2022-07-16 NOTE — TOC Progression Note (Signed)
Transition of Care [facility]) - Progression Note    Patient Details  Name: [name] MRN: [id number] Date of Birth: 20-Aug-1950  Transition of Care [facility]) CM/SW [place], LCSW Phone Number: 07/16/2022, 4:11 PM  Clinical Narrative:    CSW spoke with pt wife via phone regarding dc to SNF. Pt wife expressed she would like for pt to go to [facility] in [place] and that pt is currently being set up with HD at [facility] in [place]. Wife reported that pt has previously gone to [facility] before.   CSW reached out to [facility] to learn of additional documents needed to consider pt, VM was left.   CSW completed a 30 day note which is required for additional documentation for PASRR. Note is currently awaiting MD signature.   TOC will continue to follow this admission.    Expected Discharge Plan: [place] Barriers to Discharge: Continued Medical Work up  Expected Discharge Plan and Services In-house Referral: NA Discharge Planning Services: CM Consult Post Acute Care Choice: NA Living arrangements for the past 2 months: Single Family Home Expected Discharge Date: 07/12/22               DME Arranged: N/A DME Agency: NA       HH Arranged: NA           Social Determinants of Health (SDOH) Interventions [place]: Low Risk  (07/13/2022)  Depression (PHQ2-9): Medium Risk (04/03/2020)  Tobacco Use: Medium Risk (07/09/2022)    Readmission Risk Interventions    07/10/2022    2:52 PM  Readmission Risk Prevention Plan  Transportation Screening Complete  PCP or Specialist Appt within 3-5 Days Complete  HRI or [place] Complete  Palliative Care Screening Not Applicable  Medication Review (RN Care Manager) Complete   Beckey Rutter, MSW, LCSWA, LCASA Transitions of Care  Clinical Social Worker I

## 2022-07-16 NOTE — Progress Notes (Signed)
Physical Therapy Treatment Patient Details Name: [name] MRN: [id number] DOB: [birth year] Today's Date: 07/16/2022   History of Present Illness Pt is 72 y.o. male admitted 04/09/22  to the ER at [facility] with abdominal pain concerning for peritonitis, transferred to [facility]. Pt with a history of ESRD on PD, CAD, HF with preserved EF, COPD, PD, and HTN.    PT Comments    Pt received in supine, lethargic and minimally conversant, awoken with increased time and with good participation and fair tolerance for transfer training. Pt with bowel incontinence but had been unaware/had not notified staff, and needed dense multimodal cues and greatly increased time to initiate/perform all functional mobility tasks this date. Pt needing up to modA for bed mobility and transfers from elevated surface height, and standing tolerance was limited due to symptomatic orthostatic hypotension. Pt unable to report symptoms while standing but sat impulsively then reported fatigue/BLE weakness. SpO2/HR WFL on RA. Pt bed hydraulics noted to be broken with end of bed dropping down as pt sitting EOB, bed tagged and removed from the room. Unit secretary was notified to replace bed while pt sitting up in chair, chair alarm on for pt safety. Pt continues to benefit from PT services to progress toward functional mobility goals.  Orthostatic BPs  Supine 130/66 (86) HR 60 bpm  Sitting 104/88 (95) HR 63 (after stand pivot transfer from bed to chair, pt sitting in chair with feet on floor)  Standing Pt unable to stand long enough for BP reading to be taken  Reclined in chair 127/64 (84) HR 60 bpm   Disposition updated below per discussion with pt/supervising PT Zain B as per pt, his spouse is in hospital for abdominal surgery this week and won't be able to assist him with lifting up and ADLs as she typically would.    Recommendations for follow up therapy are one component of a multi-disciplinary discharge planning process, led  by the attending physician.  Recommendations may be updated based on patient status, additional functional criteria and insurance authorization.  Follow Up Recommendations  Skilled nursing-short term rehab (<3 hours/day) Can patient physically be transported by private vehicle: No   Assistance Recommended at Discharge Frequent or constant Supervision/Assistance  Patient can return home with the following Two people to help with bathing/dressing/bathroom;Assistance with cooking/housework;Assist for transportation;Help with stairs or ramp for entrance;Two people to help with walking and/or transfers;Direct supervision/assist for medications management;Direct supervision/assist for financial management   Equipment Recommendations  Hospital bed;BSC/3in1;Wheelchair (measurements PT) (may need mechanical lift pending progress)    Recommendations for Other Services       Precautions / Restrictions Precautions Precautions: Fall;Other (comment) Precaution Comments: peritoneal dialysis daily; orthostatic hypotension 2/27 Restrictions Weight Bearing Restrictions: No     Mobility  Bed Mobility Overal bed mobility: Needs Assistance Bed Mobility: Rolling, Sidelying to Sit Rolling: Min guard Sidelying to sit: Mod assist, HOB elevated       General bed mobility comments: +rail, increased time, dense cues for technique/safety, pt needing modA to fully raise trunk after unable to raise up with significant effort using rail. Min/modA for anterior scooting EOB to foot flat.    Transfers Overall transfer level: Needs assistance Equipment used: Rolling walker (2 wheels) Transfers: Sit to/from Stand, Bed to chair/wheelchair/BSC Sit to Stand: Min assist, From elevated surface, Mod assist   Step pivot transfers: Min assist       General transfer comment: increased time, assist to power up after bed height significantly elevated, dense  cues for safe UE placement and x2 attempts with minA to stand  upright fully. Step pivot with RW bed to recliner, ~75f as pt had to turn around due to multiple lines on his R side. Pt sat abruptly once close to chair, likely due to orthostatic hypotensive symptoms (pt unable to report symptoms until after sitting down) and needed min/modA for safe lowering assist due to pt fatigue.    Ambulation/Gait             Pre-gait activities: limited to pivotal stepping at bedside to chair due to likely orthostatic symptoms        Balance Overall balance assessment: Needs assistance Sitting-balance support: Feet supported, No upper extremity supported Sitting balance-Leahy Scale: Good     Standing balance support: Reliant on assistive device for balance Standing balance-Leahy Scale: Poor Standing balance comment: Pt needs BUE support on the RW for transfers                            Cognition Arousal/Alertness: Lethargic Behavior During Therapy: Flat affect Overall Cognitive Status: No family/caregiver present to determine baseline cognitive functioning                                 General Comments: Following simple commands ~75% of the time, needs increased time to respond and dense sequencing cues for body mechanics to perform all tasks. Minimally interactive with therapist, he does state when asked [place]. I [facility] him [facility]. Pt with bowel incontinence today and was unaware, needed also some instructions/reminders on cutting his food up safely when tray arrived.        Exercises Other Exercises Other Exercises: seated BLE AROM: ankle pumps x10 reps; standing hip flexion x10 reps at RW    General Comments General comments (skin integrity, edema, etc.): BP 130/66 (86), HR 60 supine 11:58 AM; BP 118/68 (83) sitting EOB HR 63 bpm SpO2 93% on RA; BP  104/88 (95) after pivotal transfer to chair, pt unable to stand long enough for BP reading in stance due to weakness/fatigue; BP 127/64 (84) reclined in chair HR 60 end of session; RN notified of pt bowel incontinence.      Pertinent Vitals/Pain Pain Assessment Pain Assessment: Faces Faces Pain Scale: Hurts a little bit Pain Location: B feet pain with standing Pain Descriptors / Indicators: Discomfort Pain Intervention(s): Monitored during session, Limited activity within patient's tolerance, Repositioned     PT Goals (current goals can now be found in the care plan section) Acute Rehab PT Goals Patient Stated Goal: to get stronger at rehab then home, since my wife can't lift me (spouse abd surgery) PT Goal Formulation: With patient Time For Goal Achievement: 07/25/22 Progress towards PT goals: Progressing toward goals    Frequency    Min 3X/week      PT Plan Discharge plan needs to be updated       AM-PAC PT "6 Clicks" Mobility   Outcome Measure  Help needed turning from your back to your side while in a flat bed without using bedrails?: A Little Help needed moving from lying on your back to sitting on the side of a flat bed without using bedrails?:  A Lot Help needed moving to and from a bed to a chair (including a wheelchair)?: A Lot Help needed standing up from a chair using your arms (e.g., wheelchair or bedside chair)?: A Lot Help needed to walk in hospital room?: Total Help needed climbing 3-5 steps with a railing? : Total 6 Click Score: 11    End of Session Equipment Utilized During Treatment: Gait belt Activity Tolerance: Patient limited by lethargy;Patient limited by fatigue Patient left: in chair;with call bell/phone within reach;with chair alarm set;Other (comment) (pt heels floated, pt set up to eat lunch) Nurse Communication: Mobility status;Precautions;Other (comment) (RN notified pt episode of bowel incontinence and pt was unaware and that his bed  hydraulics were broken, PTA tagged his bed and notified unit secretary to order him a new one, PTA placed his tagged bed in hallway to make rm for new bed) PT Visit Diagnosis: Other abnormalities of gait and mobility (R26.89);Muscle weakness (generalized) (M62.81)     Time: [id number] PT Time Calculation (min) (ACUTE ONLY): 25 min  Charges:  $Therapeutic Activity: 23-37 mins                     Keairra Bardon P., PTA Acute Rehabilitation Services Secure Chat Preferred 9a-5:30pm Office: [place] 07/16/2022, 1:01 PM

## 2022-07-16 NOTE — Progress Notes (Signed)
[facility] Progress Note    Assessment/ Plan:   Chest pain - hx of CAD. LHC done, pt has lots of diffuse disease w/ no good targets for stenting. Medical Rx per cardiology recs.  ESRD - on PD, lives in [place], [place]. PD nightly while here. , will c/w 1.5% bags tonight HTN/ volume - initial CXR mild vasc congestion. LVEDP w/ cath was 18, euvolemic per cards note. Is right at dry wt today. Euvolemic on exam. Using all 1.5% fluids.  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 Cont home po vdra. HTN - BP's are low and wt's are down, norvasc d/c'ed and coreg dose lowered. May need some IVF"s if not eating/ drinking well. But not the case currently. Bps are acceptable Dispo - pt cannot be transferred to a SNF because of his CCPD status, we can not get PD done at a SNF. If he goes home or CIR he can do his PD. If he has to go to SNF, he has LUA AVF that we can use, and we would just need to get him placed in an outpatient unit in [place]. Discussed with primary service 2/27. Plan is to keep working with PT/star program before consideration of snf therefore will c/w PD in the meantime.  OP PD: f/b [facility], gets PD through [facility] in [place]  Per the wife --> 4 exchanges overnight, 3000 fill, 1.5 hr dwell, use greens/ yellows mostly   Dry wt is 269 lbs/ 122kg   CXR - CM, poss vasc congestion, no edema   Subjective:   Patient seen and examined bedside. He reports that he tolerated PD overnight. Net uf 617cc. He reports that his chest pain is okay today. Denies swelling, SOB, abd pain.   Objective:   BP (!) 117/49 (BP Location: Right Arm)   Pulse 60   Temp (!) 97.1 F (36.2 C) (Oral)   Resp 16   Ht '6\' 4"'$  (1.93 m) Comment: in  Wt 123.9 kg   SpO2 96%   BMI 33.25 kg/m   Intake/Output Summary (Last 24 hours) at 07/16/2022 [id number] Last data filed at 07/16/2022 0804 Gross per 24 hour  Intake 1453 ml  Output 617 ml  Net 836 ml   Weight change:  1.4 kg  Physical Exam: Gen: NAD CVS: RRR Resp: CTA b/l Abd: soft, nt/nd Ext: no edema Neuro: awake, alert Dialysis access: LUE AVF +b/t, PD cath c/d/i  Imaging: No results found.  Labs: BMET Recent Labs  Lab 07/11/22 0115 07/12/22 0122 07/13/22 0108 07/14/22 0052  NA 133* 133* 133* 133*  K 3.0* 2.9* 3.0* 3.2*  CL 94* 96* 93* 95*  CO2 '24 24 24 24  '$ GLUCOSE 193* 167* 167* 169*  BUN 68* 63* 64* 61*  CREATININE 6.09* 6.18* 6.09* 6.66*  CALCIUM 8.9 8.8* 8.6* 8.7*  PHOS 4.8* 4.7* 5.6*  --    CBC Recent Labs  Lab 07/12/22 0122 07/13/22 0108 07/14/22 0052 07/15/22 0047  WBC 10.9* 11.4* 11.8* 9.3  HGB 10.5* 10.8* 10.9* 10.8*  HCT 30.0* 31.9* 33.0* 32.8*  MCV 103.8* 104.6* 106.1* 106.1*  PLT 126* 159 158 148*    Medications:     aspirin EC  81 mg Oral Daily   atorvastatin  40 mg Oral Daily   budesonide  2 mL Inhalation BID   calcium citrate  200 mg of elemental calcium Oral Daily   carvedilol  12.5 mg Oral BID WC  Chlorhexidine Gluconate Cloth  6 each Topical Daily   clopidogrel  75 mg Oral Daily   enoxaparin (LOVENOX) injection  30 mg Subcutaneous Q24H   gentamicin cream  1 Application Topical Daily   insulin aspart  0-15 Units Subcutaneous TID WC   insulin aspart  0-5 Units Subcutaneous QHS   isosorbide mononitrate  60 mg Oral Daily   multivitamin  1 tablet Oral Daily   pantoprazole  40 mg Oral Daily   polyethylene glycol  17 g Oral BID   risperiDONE  0.5 mg Oral QHS   rOPINIRole  1 mg Oral Daily   sertraline  25 mg Oral Daily   sodium chloride flush  3 mL Intravenous Q12H   sorbitol  60 mL Oral Once      Gean Quint, MD [facility] 07/16/2022, 8:14 AM

## 2022-07-16 NOTE — Progress Notes (Signed)
[facility] PROGRESS NOTE    Progress Note  [name]  [id number] DOB: 1950-11-29 DOA: 07/07/2022 PCP: Tobe Sos, MD     Brief Narrative:   [name] is an 72 y.o. male past medical history of CAD, AAA, aortic atherosclerosis, atrial flutter, coronary artery disease end-stage renal disease on peritoneal dialysis, COPD, diabetes mellitus type 2 comes into the ED with chief complaint of chest pain that started 2 weeks prior to admission had a cath in 2021 showed significant disease they were unsuccessful in placing a stent so medical management was recommended on aspirin and statins beta-blocker and Plavix.  Status postcardiac cath no good target lesion for PCI cardiology recommended medical management they recommended to add Imdur and continue DAPT therapy. Palliative care discussed with family they would like a second opinion as an outpatient.   Assessment/Plan:   NSTEMI: Continue aspirin, statins, Coreg, Plavix and Imdur. Patient needs to go to a skilled nursing facility as he is on peritoneal dialysis and they are having a hard time finding a place that can handle peritoneal dialysis while getting rehab. Wife cannot take him home as she has been recently admitted and had a surgical intervention. PT to cont. to work with the patient daily. Start [facility] program. Overnight medically stable to be discharged to skilled.  A-fib/atrial flutter: Not anticoagulation due to history of anemia and nonbleeding AVMs in June 2022 seen on colonoscopy. Hemoglobin has remained stable while on DAPT therapy.  Heart failure with reduced EF: EDP 18 mmHg continue further management per peritoneal dialysis. Further management per nephrology.  Symptomatic bradycardia: Status post pacemaker.  End-stage renal disease on peritoneal dialysis: Further management per nephrology.  Continue peritoneal dialysis at night. Weight is still up about 3 kg further fluid removal per  nephrology.  Anemia of chronic renal disease: Hemoglobin has remained relatively stable continue to monitor intermittently.  Metabolic bone disease: Continue PhosLo BID added.  Hyponatremia: Further management per peritoneal dialysis.  Diabetes mellitus type 2 with nephropathy: Controlled on sliding scale insulin continue current management.  COPD: Continue home inhalers.  Hyperlipidemia:  continue statins.  Class I morbid obesity: Follow-up with PCP as an outpatient will need further evaluation for sleep apnea as an outpatient.  Goals of care: Made DNR palliative care evaluation has been requested  DVT prophylaxis: scd Family Communication:Wife Status is: Inpatient Remains inpatient appropriate because: NSTEMI    Code Status:     Code Status Orders  (From admission, onward)           Start     Ordered   07/08/22 0321  Do not attempt resuscitation (DNR)  Continuous       Question Answer Comment  If patient has no pulse and is not breathing Do Not Attempt Resuscitation   If patient has a pulse and/or is breathing: Medical Treatment Goals LIMITED ADDITIONAL INTERVENTIONS: Use medication/IV fluids and cardiac monitoring as indicated; Do not use intubation or mechanical ventilation (DNI), also provide comfort medications.  Transfer to Progressive/Stepdown as indicated, avoid Intensive Care.   Consent: Discussion documented in EHR or advanced directives reviewed      07/08/22 0320           Code Status History     Date Active Date Inactive Code Status Order ID Comments User Context   07/07/2022 1946 07/08/2022 0320 Full Code [id number]  Rolla Plate, DO ED   04/09/2022 2023 04/14/2022 1852 Full Code [id number]  Idamae Schuller, MD ED  10/10/2021 0740 10/11/2021 1957 Full Code [id number]  Bernadette Hoit, DO ED   10/08/2020 2241 10/13/2020 1726 Full Code [id number]  Truett Mainland, DO Inpatient   08/02/2019 1436 08/02/2019 2130 Full Code [id number]  Waynetta Sandy, MD Inpatient   07/22/2019 2142 07/25/2019 1615 Full Code [id number]  Phillips Grout, MD ED   04/30/2019 1000 04/30/2019 1348 Full Code [id number]  Elam Dutch, MD Inpatient   04/10/2019 2050 04/13/2019 1425 DNR [id number]  Dewayne Hatch, MD ED   03/18/2018 2324 03/19/2018 1358 Full Code [id number]  Rosetta Posner, MD Inpatient   05/09/2017 1706 05/10/2017 2039 Full Code [id number]  Truett Mainland, DO Inpatient   04/04/2016 1949 04/08/2016 1806 Full Code [id number]  Orvan Falconer, MD Inpatient   09/27/2014 0146 09/28/2014 1733 DNR [id number]  Oswald Hillock, MD Inpatient         IV Access:   Peripheral IV   Procedures and diagnostic studies:   No results found.   Medical Consultants:   None.   Subjective:    [name] no complaints  Objective:    Vitals:   07/15/22 2032 07/16/22 0059 07/16/22 0100 07/16/22 0403  BP: 109/66 (!) 112/52  (!) 117/49  Pulse: 60 (!) 55  60  Resp: '20 16  16  '$ Temp: 97.9 F (36.6 C) 97.6 F (36.4 C)  (!) 97.1 F (36.2 C)  TempSrc: Oral Oral  Oral  SpO2: 96%     Weight:   123.9 kg   Height:       SpO2: 96 % O2 Flow Rate (L/min): 2 L/min FiO2 (%): 21 %   Intake/Output Summary (Last 24 hours) at 07/16/2022 0744 Last data filed at 07/16/2022 0721 Gross per 24 hour  Intake 1453 ml  Output 617 ml  Net 836 ml    Filed Weights   07/14/22 0650 07/15/22 0400 07/16/22 0100  Weight: 122.8 kg 122.5 kg 123.9 kg    Exam: General exam: In no acute distress. Respiratory system: Good air movement and clear to auscultation. Cardiovascular system: S1 & S2 heard, RRR. No JVD. Gastrointestinal system: Abdomen is nondistended, soft and nontender.  Extremities: No pedal edema. Skin: No rashes, lesions or ulcers Psychiatry: Judgement and insight appear normal. Mood & affect appropriate.  Data Reviewed:    Labs: Basic Metabolic Panel: Recent Labs  Lab 07/11/22 0115 07/12/22 0122 07/13/22 0108 07/14/22 0052  NA 133*  133* 133* 133*  K 3.0* 2.9* 3.0* 3.2*  CL 94* 96* 93* 95*  CO2 '24 24 24 24  '$ GLUCOSE 193* 167* 167* 169*  BUN 68* 63* 64* 61*  CREATININE 6.09* 6.18* 6.09* 6.66*  CALCIUM 8.9 8.8* 8.6* 8.7*  PHOS 4.8* 4.7* 5.6*  --     GFR Estimated Creatinine Clearance: 15 mL/min (A) (by C-G formula based on SCr of 6.66 mg/dL (H)). Liver Function Tests: Recent Labs  Lab 07/11/22 0115 07/12/22 0122 07/13/22 0108  ALBUMIN 2.5* 2.5* 2.4*    No results for input(s): "LIPASE", "AMYLASE" in the last 168 hours. No results for input(s): "AMMONIA" in the last 168 hours. Coagulation profile No results for input(s): "INR", "PROTIME" in the last 168 hours. COVID-19 Labs  No results for input(s): "DDIMER", "FERRITIN", "LDH", "CRP" in the last 72 hours.  Lab Results  Component Value Date   SARSCOV2NAA NEGATIVE 04/09/2022   SARSCOV2NAA NEGATIVE 10/08/2020   SARSCOV2NAA NEGATIVE 09/20/2019   [place] NEGATIVE 07/30/2019    CBC: Recent Labs  Lab 07/11/22 0115 07/12/22 0122 07/13/22  [id number] 07/14/22 0052 07/15/22 0047  WBC 12.2* 10.9* 11.4* 11.8* 9.3  HGB 11.2* 10.5* 10.8* 10.9* 10.8*  HCT 31.9* 30.0* 31.9* 33.0* 32.8*  MCV 102.6* 103.8* 104.6* 106.1* 106.1*  PLT 153 126* 159 158 148*    Cardiac Enzymes: No results for input(s): "CKTOTAL", "CKMB", "CKMBINDEX", "TROPONINI" in the last 168 hours. BNP (last 3 results) No results for input(s): "PROBNP" in the last 8760 hours. CBG: Recent Labs  Lab 07/15/22 0615 07/15/22 1106 07/15/22 1629 07/15/22 2101 07/16/22 0612  GLUCAP 131* 139* 150* 130* 125*    D-Dimer: No results for input(s): "DDIMER" in the last 72 hours. Hgb A1c: No results for input(s): "HGBA1C" in the last 72 hours. Lipid Profile: No results for input(s): "CHOL", "HDL", "LDLCALC", "TRIG", "CHOLHDL", "LDLDIRECT" in the last 72 hours.  Thyroid function studies: No results for input(s): "TSH", "T4TOTAL", "T3FREE", "THYROIDAB" in the last 72 hours.  Invalid input(s):  "FREET3" Anemia work up: No results for input(s): "VITAMINB12", "FOLATE", "FERRITIN", "TIBC", "IRON", "RETICCTPCT" in the last 72 hours. Sepsis Labs: Recent Labs  Lab 07/12/22 0122 07/13/22 0108 07/14/22 0052 07/15/22 0047  WBC 10.9* 11.4* 11.8* 9.3    Microbiology No results found for this or any previous visit (from the past 240 hour(s)).   Medications:    aspirin EC  81 mg Oral Daily   atorvastatin  40 mg Oral Daily   budesonide  2 mL Inhalation BID   calcium citrate  200 mg of elemental calcium Oral Daily   carvedilol  12.5 mg Oral BID WC   Chlorhexidine Gluconate Cloth  6 each Topical Daily   clopidogrel  75 mg Oral Daily   enoxaparin (LOVENOX) injection  30 mg Subcutaneous Q24H   gentamicin cream  1 Application Topical Daily   insulin aspart  0-15 Units Subcutaneous TID WC   insulin aspart  0-5 Units Subcutaneous QHS   isosorbide mononitrate  60 mg Oral Daily   multivitamin  1 tablet Oral Daily   pantoprazole  40 mg Oral Daily   polyethylene glycol  17 g Oral BID   risperiDONE  0.5 mg Oral QHS   rOPINIRole  1 mg Oral Daily   sertraline  25 mg Oral Daily   sodium chloride flush  3 mL Intravenous Q12H   sorbitol  60 mL Oral Once   Continuous Infusions:  sodium chloride     dialysis solution 1.5% low-MG/low-CA     nitroGLYCERIN Stopped (07/10/22 0810)      LOS: 8 days   Charlynne Cousins  [facility]  07/16/2022, 7:44 AM

## 2022-07-16 NOTE — Social Work (Signed)
RE: [name] Date of Birth: [birth year] Date: 07/16/2022   To Whom It May Concern:  Please be advised that the above-named patient will require a short-term nursing home stay - anticipated 30 days or less for rehabilitation and strengthening.  The plan is for return home.

## 2022-07-17 DIAGNOSIS — R0789 Other chest pain: Secondary | ICD-10-CM | POA: Diagnosis not present

## 2022-07-17 LAB — BASIC METABOLIC PANEL
Anion gap: 15 (ref 5–15)
BUN: 69 mg/dL — ABNORMAL HIGH (ref 8–23)
CO2: 26 mmol/L (ref 22–32)
Calcium: 8.2 mg/dL — ABNORMAL LOW (ref 8.9–10.3)
Chloride: 88 mmol/L — ABNORMAL LOW (ref 98–111)
Creatinine, Ser: 7.4 mg/dL — ABNORMAL HIGH (ref 0.61–1.24)
GFR, Estimated: 7 mL/min — ABNORMAL LOW (ref >60)
Glucose, Bld: 162 mg/dL — ABNORMAL HIGH (ref 70–99)
Potassium: 3.1 mmol/L — ABNORMAL LOW (ref 3.5–5.1)
Sodium: 129 mmol/L — ABNORMAL LOW (ref 135–145)

## 2022-07-17 LAB — HEPATITIS B SURFACE ANTIGEN: Hepatitis B Surface Ag: NONREACTIVE

## 2022-07-17 LAB — GLUCOSE, CAPILLARY
Glucose-Capillary: 122 mg/dL — ABNORMAL HIGH (ref 70–99)
Glucose-Capillary: 165 mg/dL — ABNORMAL HIGH (ref 70–99)
Glucose-Capillary: 86 mg/dL (ref 70–99)
Glucose-Capillary: 98 mg/dL (ref 70–99)

## 2022-07-17 MED ORDER — CHLORHEXIDINE GLUCONATE CLOTH 2 % EX PADS
6.0000 | MEDICATED_PAD | Freq: Every day | CUTANEOUS | Status: DC
  Administered 2022-07-18 – 2022-07-23 (×5): 6 via TOPICAL

## 2022-07-17 NOTE — Progress Notes (Signed)
[facility] PROGRESS NOTE    Progress Note  [name]  [id number] DOB: 07-22-1950 DOA: 07/07/2022 PCP: Tobe Sos, MD     Brief Narrative:   [name] is an 72 y.o. male past medical history of CAD, AAA, aortic atherosclerosis, atrial flutter, coronary artery disease end-stage renal disease on peritoneal dialysis, COPD, diabetes mellitus type 2 comes into the ED with chief complaint of chest pain that started 2 weeks prior to admission had a cath in 2021 showed significant disease they were unsuccessful in placing a stent so medical management was recommended on aspirin and statins beta-blocker and Plavix.  Status postcardiac cath no good target lesion for PCI cardiology recommended medical management they recommended to add Imdur and continue DAPT therapy. Palliative care discussed with family they would like a second opinion as an outpatient.   Assessment/Plan:   NSTEMI: Continue aspirin, statins, Coreg, Plavix and Imdur. Patient needs to go to a skilled nursing facility as he is on peritoneal dialysis and they are having a hard time finding a place that can handle peritoneal dialysis while getting rehab. Wife cannot take him home as she has been recently admitted and had a surgical intervention. PT to cont. to work with the patient daily. Start [facility] program. Overnight medically stable to be discharged to skilled.  A-fib/atrial flutter: Not anticoagulation due to history of anemia and nonbleeding AVMs in June 2022 seen on colonoscopy. Hemoglobin has remained stable while on DAPT therapy.  Heart failure with reduced EF: EDP 18 mmHg continue further management per peritoneal dialysis. Further management per nephrology.  Symptomatic bradycardia: Status post pacemaker.  End-stage renal disease on peritoneal dialysis: Further management per nephrology.  Continue peritoneal dialysis at night. Weight is still up about 3 kg further fluid removal per  nephrology.  Anemia of chronic renal disease: Hemoglobin has remained relatively stable continue to monitor intermittently.  Metabolic bone disease: Continue PhosLo BID added.  Hyponatremia: Further management per peritoneal dialysis.  Diabetes mellitus type 2 with nephropathy: Controlled on sliding scale insulin continue current management.  COPD: Continue home inhalers.  Hyperlipidemia:  continue statins.  Class I morbid obesity: Follow-up with PCP as an outpatient will need further evaluation for sleep apnea as an outpatient.  Metabolic encephalopathy: Likely due to medication Seroquel and Requip will hold these.  Goals of care: Made DNR palliative care evaluation has been requested  DVT prophylaxis: scd Family Communication:Wife Status is: Inpatient Remains inpatient appropriate because: NSTEMI    Code Status:     Code Status Orders  (From admission, onward)           Start     Ordered   07/08/22 0321  Do not attempt resuscitation (DNR)  Continuous       Question Answer Comment  If patient has no pulse and is not breathing Do Not Attempt Resuscitation   If patient has a pulse and/or is breathing: Medical Treatment Goals LIMITED ADDITIONAL INTERVENTIONS: Use medication/IV fluids and cardiac monitoring as indicated; Do not use intubation or mechanical ventilation (DNI), also provide comfort medications.  Transfer to Progressive/Stepdown as indicated, avoid Intensive Care.   Consent: Discussion documented in EHR or advanced directives reviewed      07/08/22 0320           Code Status History     Date Active Date Inactive Code Status Order ID Comments User Context   07/07/2022 1946 07/08/2022 0320 Full Code [id number]  Albert Plate, DO ED   04/09/2022  2023 04/14/2022 1852 Full Code [id number]  Albert Schuller, MD ED   10/10/2021 0740 10/11/2021 1957 Full Code [id number]  Albert Hoit, DO ED   10/08/2020 2241 10/13/2020 1726 Full Code [id number]   Albert Mainland, DO Inpatient   08/02/2019 1436 08/02/2019 2130 Full Code [id number]  Albert Sandy, MD Inpatient   07/22/2019 2142 07/25/2019 1615 Full Code [id number]  Albert Grout, MD ED   04/30/2019 1000 04/30/2019 1348 Full Code [id number]  Albert Dutch, MD Inpatient   04/10/2019 2050 04/13/2019 1425 DNR [id number]  Albert Hatch, MD ED   03/18/2018 2324 03/19/2018 1358 Full Code [id number]  Albert Posner, MD Inpatient   05/09/2017 1706 05/10/2017 2039 Full Code [id number]  Albert Mainland, DO Inpatient   04/04/2016 1949 04/08/2016 1806 Full Code [id number]  Albert Falconer, MD Inpatient   09/27/2014 0146 09/28/2014 1733 DNR [id number]  Albert Hillock, MD Inpatient         IV Access:   Peripheral IV   Procedures and diagnostic studies:   No results found.   Medical Consultants:   None.   Subjective:    [name] no complaints, was sleepy this morning he relates he wants to be left alone to sleep.  Objective:    Vitals:   07/16/22 1957 07/16/22 2100 07/17/22 0026 07/17/22 0418  BP:  133/73 123/61 (!) 144/72  Pulse: 60 (!) 59 (!) 59 60  Resp: 19 (!) '21 19 16  '$ Temp:  (!) 97.5 F (36.4 C) 97.6 F (36.4 C) (S) (!) (P) 97.5 F (36.4 C)  TempSrc:  Oral Oral Oral  SpO2: 93% 95% 90% 90%  Weight:   125 kg   Height:       SpO2: 90 % O2 Flow Rate (L/min): 2 L/min FiO2 (%): 21 %   Intake/Output Summary (Last 24 hours) at 07/17/2022 0809 Last data filed at 07/17/2022 0447 Gross per 24 hour  Intake 594 ml  Output 913 ml  Net -319 ml    Filed Weights   07/16/22 0100 07/16/22 1756 07/17/22 0026  Weight: 123.9 kg 122 kg 125 kg    Exam: General exam: In no acute distress. Respiratory system: Good air movement and clear to auscultation. Cardiovascular system: S1 & S2 heard, RRR. No JVD. Gastrointestinal system: Abdomen is nondistended, soft and nontender.  Extremities: No pedal edema. Skin: No rashes, lesions or ulcers Psychiatry: Judgment and  insight appear normal, just slow to respond.  Data Reviewed:    Labs: Basic Metabolic Panel: Recent Labs  Lab 07/11/22 0115 07/12/22 0122 07/13/22 0108 07/14/22 0052  NA 133* 133* 133* 133*  K 3.0* 2.9* 3.0* 3.2*  CL 94* 96* 93* 95*  CO2 '24 24 24 24  '$ GLUCOSE 193* 167* 167* 169*  BUN 68* 63* 64* 61*  CREATININE 6.09* 6.18* 6.09* 6.66*  CALCIUM 8.9 8.8* 8.6* 8.7*  PHOS 4.8* 4.7* 5.6*  --     GFR Estimated Creatinine Clearance: 15.1 mL/min (A) (by C-G formula based on SCr of 6.66 mg/dL (H)). Liver Function Tests: Recent Labs  Lab 07/11/22 0115 07/12/22 0122 07/13/22 0108  ALBUMIN 2.5* 2.5* 2.4*    No results for input(s): "LIPASE", "AMYLASE" in the last 168 hours. No results for input(s): "AMMONIA" in the last 168 hours. Coagulation profile No results for input(s): "INR", "PROTIME" in the last 168 hours. COVID-19 Labs  No results for input(s): "DDIMER", "FERRITIN", "LDH", "CRP" in the last 72 hours.  Lab Results  Component Value Date  [place] NEGATIVE 04/09/2022   [place] NEGATIVE 10/08/2020   SARSCOV2NAA NEGATIVE 09/20/2019   [place] NEGATIVE 07/30/2019    CBC: Recent Labs  Lab 07/11/22 0115 07/12/22 0122 07/13/22 0108 07/14/22 0052 07/15/22 0047  WBC 12.2* 10.9* 11.4* 11.8* 9.3  HGB 11.2* 10.5* 10.8* 10.9* 10.8*  HCT 31.9* 30.0* 31.9* 33.0* 32.8*  MCV 102.6* 103.8* 104.6* 106.1* 106.1*  PLT 153 126* 159 158 148*    Cardiac Enzymes: No results for input(s): "CKTOTAL", "CKMB", "CKMBINDEX", "TROPONINI" in the last 168 hours. BNP (last 3 results) No results for input(s): "PROBNP" in the last 8760 hours. CBG: Recent Labs  Lab 07/16/22 0612 07/16/22 1106 07/16/22 1614 07/16/22 2103 07/17/22 0600  GLUCAP 125* 143* 168* 143* 122*    D-Dimer: No results for input(s): "DDIMER" in the last 72 hours. Hgb A1c: No results for input(s): "HGBA1C" in the last 72 hours. Lipid Profile: No results for input(s): "CHOL", "HDL", "LDLCALC",  "TRIG", "CHOLHDL", "LDLDIRECT" in the last 72 hours.  Thyroid function studies: No results for input(s): "TSH", "T4TOTAL", "T3FREE", "THYROIDAB" in the last 72 hours.  Invalid input(s): "FREET3" Anemia work up: No results for input(s): "VITAMINB12", "FOLATE", "FERRITIN", "TIBC", "IRON", "RETICCTPCT" in the last 72 hours. Sepsis Labs: Recent Labs  Lab 07/12/22 0122 07/13/22 0108 07/14/22 0052 07/15/22 0047  WBC 10.9* 11.4* 11.8* 9.3    Microbiology No results found for this or any previous visit (from the past 240 hour(s)).   Medications:    aspirin EC  81 mg Oral Daily   atorvastatin  40 mg Oral Daily   budesonide  2 mL Inhalation BID   calcium citrate  200 mg of elemental calcium Oral Daily   carvedilol  12.5 mg Oral BID WC   Chlorhexidine Gluconate Cloth  6 each Topical Daily   clopidogrel  75 mg Oral Daily   enoxaparin (LOVENOX) injection  30 mg Subcutaneous Q24H   gentamicin cream  1 Application Topical Daily   insulin aspart  0-15 Units Subcutaneous TID WC   insulin aspart  0-5 Units Subcutaneous QHS   isosorbide mononitrate  60 mg Oral Daily   multivitamin  1 tablet Oral Daily   pantoprazole  40 mg Oral Daily   polyethylene glycol  17 g Oral BID   risperiDONE  0.5 mg Oral QHS   rOPINIRole  1 mg Oral Daily   sertraline  25 mg Oral Daily   sodium chloride flush  3 mL Intravenous Q12H   sorbitol  60 mL Oral Once   Continuous Infusions:  sodium chloride     dialysis solution 1.5% low-MG/low-CA     nitroGLYCERIN Stopped (07/10/22 0810)      LOS: 9 days   Charlynne Cousins  [facility]  07/17/2022, 8:09 AM

## 2022-07-17 NOTE — Progress Notes (Deleted)
[facility] PROGRESS NOTE    Progress Note  [name]  [id number] DOB: 01-Mar-1951 DOA: 07/07/2022 PCP: Tobe Sos, MD     Brief Narrative:   [name] is an 72 y.o. male past medical history of CAD, AAA, aortic atherosclerosis, atrial flutter, coronary artery disease end-stage renal disease on peritoneal dialysis, COPD, diabetes mellitus type 2 comes into the ED with chief complaint of chest pain that started 2 weeks prior to admission had a cath in 2021 showed significant disease they were unsuccessful in placing a stent so medical management was recommended on aspirin and statins beta-blocker and Plavix.  Status postcardiac cath no good target lesion for PCI cardiology recommended medical management they recommended to add Imdur and continue DAPT therapy. Palliative care discussed with family they would like a second opinion as an outpatient.   Assessment/Plan:   NSTEMI: Continue aspirin, statins, Coreg, Plavix and Imdur. Patient needs to go to a skilled nursing facility as he is on peritoneal dialysis and they are having a hard time finding a place that can handle peritoneal dialysis while getting rehab. Wife cannot take him home as she has been recently admitted and had a surgical intervention. PT to cont. to work with the patient daily. Start [facility] program. No events overnight the patient is medically stable to be discharged to skilled.  A-fib/atrial flutter: Not anticoagulation due to history of anemia and nonbleeding AVMs in June 2022 seen on colonoscopy. Hemoglobin has remained stable while on DAPT therapy.  Heart failure with reduced EF: EDP 18 mmHg continue further management per peritoneal dialysis. Further management per nephrology.  Symptomatic bradycardia: Status post pacemaker.  End-stage renal disease on peritoneal dialysis: Further management per nephrology.  Continue peritoneal dialysis at night. Weight is still up about 3 kg  further fluid removal per nephrology.  Anemia of chronic renal disease: Hemoglobin has remained relatively stable continue to monitor intermittently.  Metabolic bone disease: Continue PhosLo BID added.  Hyponatremia: Further management per peritoneal dialysis.  Diabetes mellitus type 2 with nephropathy: Controlled on sliding scale insulin continue current management.  COPD: Continue home inhalers.  Hyperlipidemia:  continue statins.  Class I morbid obesity: Follow-up with PCP as an outpatient will need further evaluation for sleep apnea as an outpatient.  Goals of care: Made DNR palliative care evaluation has been requested  DVT prophylaxis: scd Family Communication:Wife Status is: Inpatient Remains inpatient appropriate because: NSTEMI    Code Status:     Code Status Orders  (From admission, onward)           Start     Ordered   07/08/22 0321  Do not attempt resuscitation (DNR)  Continuous       Question Answer Comment  If patient has no pulse and is not breathing Do Not Attempt Resuscitation   If patient has a pulse and/or is breathing: Medical Treatment Goals LIMITED ADDITIONAL INTERVENTIONS: Use medication/IV fluids and cardiac monitoring as indicated; Do not use intubation or mechanical ventilation (DNI), also provide comfort medications.  Transfer to Progressive/Stepdown as indicated, avoid Intensive Care.   Consent: Discussion documented in EHR or advanced directives reviewed      07/08/22 0320           Code Status History     Date Active Date Inactive Code Status Order ID Comments User Context   07/07/2022 1946 07/08/2022 0320 Full Code [id number]  Rolla Plate, DO ED   04/09/2022 2023 04/14/2022 1852 Full Code [id number]  Humphrey Rolls,  Rosine Beat, MD ED   10/10/2021 0740 10/11/2021 1957 Full Code [id number]  Bernadette Hoit, DO ED   10/08/2020 2241 10/13/2020 1726 Full Code [id number]  Truett Mainland, DO Inpatient   08/02/2019 1436 08/02/2019 2130 Full  Code [id number]  Waynetta Sandy, MD Inpatient   07/22/2019 2142 07/25/2019 1615 Full Code [id number]  Phillips Grout, MD ED   04/30/2019 1000 04/30/2019 1348 Full Code [id number]  Elam Dutch, MD Inpatient   04/10/2019 2050 04/13/2019 1425 DNR [id number]  Dewayne Hatch, MD ED   03/18/2018 2324 03/19/2018 1358 Full Code [id number]  Rosetta Posner, MD Inpatient   05/09/2017 1706 05/10/2017 2039 Full Code [id number]  Truett Mainland, DO Inpatient   04/04/2016 1949 04/08/2016 1806 Full Code [id number]  Orvan Falconer, MD Inpatient   09/27/2014 0146 09/28/2014 1733 DNR [id number]  Oswald Hillock, MD Inpatient         IV Access:   Peripheral IV   Procedures and diagnostic studies:   No results found.   Medical Consultants:   None.   Subjective:    [name] no complaints  Objective:    Vitals:   07/16/22 1957 07/16/22 2100 07/17/22 0026 07/17/22 0418  BP:  133/73 123/61 (!) 144/72  Pulse: 60 (!) 59 (!) 59 60  Resp: 19 (!) '21 19 16  '$ Temp:  (!) 97.5 F (36.4 C) 97.6 F (36.4 C) (S) (!) (P) 97.5 F (36.4 C)  TempSrc:  Oral Oral Oral  SpO2: 93% 95% 90% 90%  Weight:   125 kg   Height:       SpO2: 90 % O2 Flow Rate (L/min): 2 L/min FiO2 (%): 21 %   Intake/Output Summary (Last 24 hours) at 07/17/2022 0809 Last data filed at 07/17/2022 0447 Gross per 24 hour  Intake 594 ml  Output 913 ml  Net -319 ml    Filed Weights   07/16/22 0100 07/16/22 1756 07/17/22 0026  Weight: 123.9 kg 122 kg 125 kg    Exam: General exam: In no acute distress. Respiratory system: Good air movement and clear to auscultation. Cardiovascular system: S1 & S2 heard, RRR. No JVD. Gastrointestinal system: Abdomen is nondistended, soft and nontender.  Extremities: No pedal edema. Skin: No rashes, lesions or ulcers Psychiatry: Judgement and insight appear normal. Mood & affect appropriate.  Data Reviewed:    Labs: Basic Metabolic Panel: Recent Labs  Lab 07/11/22 0115  07/12/22 0122 07/13/22 0108 07/14/22 0052  NA 133* 133* 133* 133*  K 3.0* 2.9* 3.0* 3.2*  CL 94* 96* 93* 95*  CO2 '24 24 24 24  '$ GLUCOSE 193* 167* 167* 169*  BUN 68* 63* 64* 61*  CREATININE 6.09* 6.18* 6.09* 6.66*  CALCIUM 8.9 8.8* 8.6* 8.7*  PHOS 4.8* 4.7* 5.6*  --     GFR Estimated Creatinine Clearance: 15.1 mL/min (A) (by C-G formula based on SCr of 6.66 mg/dL (H)). Liver Function Tests: Recent Labs  Lab 07/11/22 0115 07/12/22 0122 07/13/22 0108  ALBUMIN 2.5* 2.5* 2.4*    No results for input(s): "LIPASE", "AMYLASE" in the last 168 hours. No results for input(s): "AMMONIA" in the last 168 hours. Coagulation profile No results for input(s): "INR", "PROTIME" in the last 168 hours. COVID-19 Labs  No results for input(s): "DDIMER", "FERRITIN", "LDH", "CRP" in the last 72 hours.  Lab Results  Component Value Date   SARSCOV2NAA NEGATIVE 04/09/2022   SARSCOV2NAA NEGATIVE 10/08/2020   SARSCOV2NAA NEGATIVE 09/20/2019   [place] NEGATIVE 07/30/2019    CBC:  Recent Labs  Lab 07/11/22 0115 07/12/22 0122 07/13/22 0108 07/14/22 0052 07/15/22 0047  WBC 12.2* 10.9* 11.4* 11.8* 9.3  HGB 11.2* 10.5* 10.8* 10.9* 10.8*  HCT 31.9* 30.0* 31.9* 33.0* 32.8*  MCV 102.6* 103.8* 104.6* 106.1* 106.1*  PLT 153 126* 159 158 148*    Cardiac Enzymes: No results for input(s): "CKTOTAL", "CKMB", "CKMBINDEX", "TROPONINI" in the last 168 hours. BNP (last 3 results) No results for input(s): "PROBNP" in the last 8760 hours. CBG: Recent Labs  Lab 07/16/22 0612 07/16/22 1106 07/16/22 1614 07/16/22 2103 07/17/22 0600  GLUCAP 125* 143* 168* 143* 122*    D-Dimer: No results for input(s): "DDIMER" in the last 72 hours. Hgb A1c: No results for input(s): "HGBA1C" in the last 72 hours. Lipid Profile: No results for input(s): "CHOL", "HDL", "LDLCALC", "TRIG", "CHOLHDL", "LDLDIRECT" in the last 72 hours.  Thyroid function studies: No results for input(s): "TSH", "T4TOTAL", "T3FREE",  "THYROIDAB" in the last 72 hours.  Invalid input(s): "FREET3" Anemia work up: No results for input(s): "VITAMINB12", "FOLATE", "FERRITIN", "TIBC", "IRON", "RETICCTPCT" in the last 72 hours. Sepsis Labs: Recent Labs  Lab 07/12/22 0122 07/13/22 0108 07/14/22 0052 07/15/22 0047  WBC 10.9* 11.4* 11.8* 9.3    Microbiology No results found for this or any previous visit (from the past 240 hour(s)).   Medications:    aspirin EC  81 mg Oral Daily   atorvastatin  40 mg Oral Daily   budesonide  2 mL Inhalation BID   calcium citrate  200 mg of elemental calcium Oral Daily   carvedilol  12.5 mg Oral BID WC   Chlorhexidine Gluconate Cloth  6 each Topical Daily   clopidogrel  75 mg Oral Daily   enoxaparin (LOVENOX) injection  30 mg Subcutaneous Q24H   gentamicin cream  1 Application Topical Daily   insulin aspart  0-15 Units Subcutaneous TID WC   insulin aspart  0-5 Units Subcutaneous QHS   isosorbide mononitrate  60 mg Oral Daily   multivitamin  1 tablet Oral Daily   pantoprazole  40 mg Oral Daily   polyethylene glycol  17 g Oral BID   risperiDONE  0.5 mg Oral QHS   rOPINIRole  1 mg Oral Daily   sertraline  25 mg Oral Daily   sodium chloride flush  3 mL Intravenous Q12H   sorbitol  60 mL Oral Once   Continuous Infusions:  sodium chloride     dialysis solution 1.5% low-MG/low-CA     nitroGLYCERIN Stopped (07/10/22 0810)      LOS: 9 days   Charlynne Cousins  [facility]  07/17/2022, 8:09 AM

## 2022-07-17 NOTE — Progress Notes (Signed)
Mobility Specialist - Progress Note   07/17/22 1152  Mobility  Activity Ambulated with assistance in room;Transferred from bed to chair  Level of Assistance Moderate assist, patient does 50-74%  Assistive Device Front wheel walker  Distance Ambulated (ft) 15 ft  Activity Response Tolerated well  Mobility Referral Yes  $Mobility charge 1 Mobility    Pt agreeable to mobility after sitting EOB ~5 minutes. ModA to stand from EOB. Left in recliner w/ chair alarm on and call bell in his lap.   [place] Specialist Please contact via SecureChat or Rehab office at [phone]

## 2022-07-17 NOTE — Progress Notes (Addendum)
Wife called patient on phone in room, patient answered and placed wife on speaker phone. Wife expresses concern about patient's mental status. States a family member came to visit yesterday and he was very lethargic. Wife stated patient is usually more alert. 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 Wife informed that MD will be informed and notified of any pertinent information during assessment. Orientation questions answered appropriately. Patient took medications whole without any difficulty.   Hospitalist made aware  Nitro glycerin x2 given for patient c/o of sharp shooting chest pain. Nephrology at bedside.

## 2022-07-17 NOTE — Plan of Care (Signed)
  Problem: Education: Goal: Ability to describe self-care measures that may prevent or decrease complications (Diabetes Survival Skills Education) will improve Outcome: Progressing   Problem: Health Behavior/Discharge Planning: Goal: Ability to identify and utilize available resources and services will improve Outcome: Progressing Goal: Ability to manage health-related needs will improve Outcome: Progressing   Problem: Metabolic: Goal: Ability to maintain appropriate glucose levels will improve Outcome: Progressing   Problem: Nutritional: Goal: Maintenance of adequate nutrition will improve Outcome: Progressing Goal: Progress toward achieving an optimal weight will improve Outcome: Progressing   Problem: Skin Integrity: Goal: Risk for impaired skin integrity will decrease Outcome: Progressing   Problem: Tissue Perfusion: Goal: Adequacy of tissue perfusion will improve Outcome: Progressing   Problem: Education: Goal: Understanding of cardiac disease, CV risk reduction, and recovery process will improve Outcome: Progressing   Problem: Activity: Goal: Ability to tolerate increased activity will improve Outcome: Progressing   Problem: Cardiac: Goal: Ability to achieve and maintain adequate cardiovascular perfusion will improve Outcome: Progressing   Problem: Health Behavior/Discharge Planning: Goal: Ability to safely manage health-related needs after discharge will improve Outcome: Progressing   Problem: Education: Goal: Knowledge of General Education information will improve Description: Including pain rating scale, medication(s)/side effects and non-pharmacologic comfort measures Outcome: Progressing   Problem: Health Behavior/Discharge Planning: Goal: Ability to manage health-related needs will improve Outcome: Progressing   Problem: Clinical Measurements: Goal: Ability to maintain clinical measurements within normal limits will improve Outcome:  Progressing Goal: Will remain free from infection Outcome: Progressing Goal: Diagnostic test results will improve Outcome: Progressing Goal: Respiratory complications will improve Outcome: Progressing Goal: Cardiovascular complication will be avoided Outcome: Progressing   Problem: Activity: Goal: Risk for activity intolerance will decrease Outcome: Progressing   Problem: Nutrition: Goal: Adequate nutrition will be maintained Outcome: Progressing   Problem: Coping: Goal: Level of anxiety will decrease Outcome: Progressing   Problem: Elimination: Goal: Will not experience complications related to bowel motility Outcome: Progressing Goal: Will not experience complications related to urinary retention Outcome: Progressing   Problem: Pain Managment: Goal: General experience of comfort will improve Outcome: Progressing   Problem: Safety: Goal: Ability to remain free from injury will improve Outcome: Progressing   Problem: Skin Integrity: Goal: Risk for impaired skin integrity will decrease Outcome: Progressing

## 2022-07-17 NOTE — Progress Notes (Signed)
   07/17/22 1000  Vitals  BP 135/68  MAP (mmHg) 87  Pulse Rate 60  ECG Heart Rate 60  Resp (!) 28  Level of Consciousness  Level of Consciousness Alert  MEWS COLOR  MEWS Score Color Yellow  Oxygen Therapy  SpO2 91 %  O2 Device Nasal Cannula  O2 Flow Rate (L/min) 2 L/min  MEWS Score  MEWS Temp 0  MEWS Systolic 0  MEWS Pulse 0  MEWS RR 2  MEWS LOC 0  MEWS Score 2   Nitroglycerin given- BP rechecked , respirations 28. Patient placed on 2L nasal cannula.

## 2022-07-17 NOTE — Progress Notes (Signed)
[facility] Progress Note    Assessment/ Plan:   Chest pain - hx of CAD. LHC done, pt has lots of diffuse disease w/ no good targets for stenting. Medical Rx per cardiology recs.  ESRD - on PD, lives in [place], [place]. Last PD 2/17-2/28 overnight. Plan is to transition to HD via his LUE AVF given dispo, HD tomorrow 2/29 HTN/ volume - initial CXR mild vasc congestion. LVEDP w/ cath was 18, euvolemic per cards note. Is right at dry wt today. Euvolemic on exam. Using all 1.5% fluids.  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 Cont home po vdra. HTN - BP's are low and wt's are down, norvasc d/c'ed and coreg dose lowered. May need some IVF"s if not eating/ drinking well. But not the case currently. Bps are acceptable AMS - per primary Dispo - pt cannot be transferred to a SNF because of his CCPD status, we can not get PD done at a SNF. If he goes home or CIR he can do his PD. If he has to go to SNF, he has LUA AVF that we can use, and we would just need to get him placed in an outpatient unit in [place]. Discussed with primary service 2/27. Plan is to keep working with PT/[facility] program before consideration of snf therefore will c/w PD in the meantime. Discussed with SW later on 2/27, plan is for SNF, transitioning to HD 2/29  OP PD: f/b [facility], gets PD through [facility] in [place]  Per the wife --> 4 exchanges overnight, 3000 fill, 1.5 hr dwell, use greens/ yellows mostly   Dry wt is 269 lbs/ 122kg   CXR - CM, poss vasc congestion, no edema    Discussed with patient's wife  Subjective:   Patient seen and examined bedside. Tolerated PD overnight. Wife on patient's cell phone speakphone. She is concerned about his mentation which has worsened. Per rn, pmd already notified. Does have a history of serotonin syndrome. She is looking into [facility] for [facility], discussed plan about transitioning to HD and PD flushes and she is agreeable    Objective:   BP 135/68   Pulse 60   Temp 98.2 F (36.8 C) (Oral)   Resp (!) 28   Ht '6\' 4"'$  (1.93 m) Comment: in  Wt 125 kg   SpO2 91%   BMI 33.54 kg/m   Intake/Output Summary (Last 24 hours) at 07/17/2022 1032 Last data filed at 07/17/2022 [id number] Gross per 24 hour  Intake 714 ml  Output 913 ml  Net -199 ml   Weight change: -1.9 kg  Physical Exam: Gen: NAD CVS: RRR Resp: CTA b/l Abd: soft, nt/nd Ext: no edema Neuro: awake, alert Dialysis access: LUE AVF +b/t, PD cath c/d/i  Imaging: No results found.  Labs: BMET Recent Labs  Lab 07/11/22 0115 07/12/22 0122 07/13/22 0108 07/14/22 0052  NA 133* 133* 133* 133*  K 3.0* 2.9* 3.0* 3.2*  CL 94* 96* 93* 95*  CO2 '24 24 24 24  '$ GLUCOSE 193* 167* 167* 169*  BUN 68* 63* 64* 61*  CREATININE 6.09* 6.18* 6.09* 6.66*  CALCIUM 8.9 8.8* 8.6* 8.7*  PHOS 4.8* 4.7* 5.6*  --    CBC Recent Labs  Lab 07/12/22 0122 07/13/22 0108 07/14/22 0052 07/15/22 0047  WBC 10.9* 11.4* 11.8* 9.3  HGB 10.5* 10.8* 10.9* 10.8*  HCT 30.0* 31.9* 33.0* 32.8*  MCV 103.8* 104.6* 106.1* 106.1*  PLT 126* 159  158 148*    Medications:     aspirin EC  81 mg Oral Daily   atorvastatin  40 mg Oral Daily   budesonide  2 mL Inhalation BID   calcium citrate  200 mg of elemental calcium Oral Daily   carvedilol  12.5 mg Oral BID WC   Chlorhexidine Gluconate Cloth  6 each Topical Daily   clopidogrel  75 mg Oral Daily   enoxaparin (LOVENOX) injection  30 mg Subcutaneous Q24H   gentamicin cream  1 Application Topical Daily   insulin aspart  0-15 Units Subcutaneous TID WC   insulin aspart  0-5 Units Subcutaneous QHS   isosorbide mononitrate  60 mg Oral Daily   multivitamin  1 tablet Oral Daily   pantoprazole  40 mg Oral Daily   polyethylene glycol  17 g Oral BID   sertraline  25 mg Oral Daily   sodium chloride flush  3 mL Intravenous Q12H   sorbitol  60 mL Oral Once      Gean Quint, MD [facility] 07/17/2022, 10:32 AM

## 2022-07-17 NOTE — Progress Notes (Signed)
Pt is for snf placement at d/c. [place] and spoke to [place]. Pt has been accepted on a MWF schedule. On Mondays and Wednesdays, pt will need to arrive at 3:00 for 3:15 chair time. On Fridays, pt will need to arrive at 2:30 so pt's PD cath can be flushed prior to HD treatment. Update provided to attending, nephrologist, and RN CM. Will fax Hep B labs and d/c summary to clinic once available. Clinic can treat pt on Friday if pt ready for d/c by then. Pt for HD tomorrow to make sure treatment goes well and pt can tolerate since pt was PD on admission. Will assist as needed.   Melven Sartorius Renal Navigator [phone]

## 2022-07-17 NOTE — Progress Notes (Signed)
Mobility Specialist - Progress Note   07/17/22 1100  Orthostatic Lying   BP- Lying 132/73 (90)  Pulse- Lying 59  Orthostatic Sitting  BP- Sitting 124/57 (75)  Pulse- Sitting 60  Orthostatic Standing at 0 minutes  BP- Standing at 0 minutes 123/79 (88)  Pulse- Standing at 0 minutes 88  Orthostatic Standing at 3 minutes  BP- Standing at 3 minutes  (N/A)  Pulse- Standing at 3 minutes  (N/A)  Mobility  Activity Stood at bedside  Level of Assistance Moderate assist, patient does 50-74%  Assistive Device Front wheel walker  Activity Response Tolerated fair  Mobility Referral Yes  $Mobility charge 1 Mobility    Pt received in bed difficult to arouse but agreeable to having orthostatic VS taken. ModA to stand from bed, pt attempted to sit down x2 before standing BP measurement was complete requiring ModA to help remain standing. Will follow up for ambulation as able.   [place] Specialist Please contact via SecureChat or Rehab office at [phone]

## 2022-07-18 ENCOUNTER — Telehealth (HOSPITAL_COMMUNITY): Payer: Self-pay

## 2022-07-18 DIAGNOSIS — R1013 Epigastric pain: Secondary | ICD-10-CM

## 2022-07-18 DIAGNOSIS — R079 Chest pain, unspecified: Secondary | ICD-10-CM | POA: Diagnosis not present

## 2022-07-18 LAB — TROPONIN I (HIGH SENSITIVITY): Troponin I (High Sensitivity): 53 ng/L — ABNORMAL HIGH (ref <18)

## 2022-07-18 LAB — CBC
HCT: 29.3 % — ABNORMAL LOW (ref 39.0–52.0)
Hemoglobin: 10.1 g/dL — ABNORMAL LOW (ref 13.0–17.0)
MCH: 35.4 pg — ABNORMAL HIGH (ref 26.0–34.0)
MCHC: 34.5 g/dL (ref 30.0–36.0)
MCV: 102.8 fL — ABNORMAL HIGH (ref 80.0–100.0)
Platelets: 152 10*3/uL (ref 150–400)
RBC: 2.85 MIL/uL — ABNORMAL LOW (ref 4.22–5.81)
RDW: 13.9 % (ref 11.5–15.5)
WBC: 7.1 10*3/uL (ref 4.0–10.5)
nRBC: 0 % (ref 0.0–0.2)

## 2022-07-18 LAB — GLUCOSE, CAPILLARY
Glucose-Capillary: 100 mg/dL — ABNORMAL HIGH (ref 70–99)
Glucose-Capillary: 93 mg/dL (ref 70–99)
Glucose-Capillary: 107 mg/dL — ABNORMAL HIGH (ref 70–99)
Glucose-Capillary: 150 mg/dL — ABNORMAL HIGH (ref 70–99)
Glucose-Capillary: 120 mg/dL — ABNORMAL HIGH (ref 70–99)
Glucose-Capillary: 126 mg/dL — ABNORMAL HIGH (ref 70–99)

## 2022-07-18 LAB — RENAL FUNCTION PANEL
Albumin: 2.2 g/dL — ABNORMAL LOW (ref 3.5–5.0)
Anion gap: 17 — ABNORMAL HIGH (ref 5–15)
BUN: 74 mg/dL — ABNORMAL HIGH (ref 8–23)
CO2: 23 mmol/L (ref 22–32)
Calcium: 8.2 mg/dL — ABNORMAL LOW (ref 8.9–10.3)
Chloride: 90 mmol/L — ABNORMAL LOW (ref 98–111)
Creatinine, Ser: 7.57 mg/dL — ABNORMAL HIGH (ref 0.61–1.24)
GFR, Estimated: 7 mL/min — ABNORMAL LOW (ref >60)
Glucose, Bld: 96 mg/dL (ref 70–99)
Phosphorus: 6.4 mg/dL — ABNORMAL HIGH (ref 2.5–4.6)
Potassium: 2.9 mmol/L — ABNORMAL LOW (ref 3.5–5.1)
Sodium: 130 mmol/L — ABNORMAL LOW (ref 135–145)

## 2022-07-18 LAB — HEPATITIS B SURFACE ANTIBODY, QUANTITATIVE: Hep B S AB Quant (Post): <3.1 m[IU]/mL — ABNORMAL LOW (ref >9.9)

## 2022-07-18 LAB — MAGNESIUM: Magnesium: 2.4 mg/dL (ref 1.7–2.4)

## 2022-07-18 LAB — POTASSIUM: Potassium: 3.5 mmol/L (ref 3.5–5.1)

## 2022-07-18 MED ORDER — ALTEPLASE 2 MG IJ SOLR: 2.0000 mg | Freq: Once | INTRAMUSCULAR | Status: DC | PRN

## 2022-07-18 MED ORDER — HEPARIN SODIUM (PORCINE) 5000 UNIT/ML IJ SOLN
5000.0000 [IU] | Freq: Three times a day (TID) | INTRAMUSCULAR | Status: DC
  Administered 2022-07-18 – 2022-07-23 (×14): 5000 [IU] via SUBCUTANEOUS
  Filled 2022-07-18 (×14): qty 1

## 2022-07-18 MED ORDER — ANTICOAGULANT SODIUM CITRATE 4% (200MG/5ML) IV SOLN: 5.0000 mL | Status: DC | PRN

## 2022-07-18 MED ORDER — LIDOCAINE HCL (PF) 1 % IJ SOLN: 5.0000 mL | INTRAMUSCULAR | Status: DC | PRN

## 2022-07-18 MED ORDER — PENTAFLUOROPROP-TETRAFLUOROETH EX AERO: 1.0000 | INHALATION_SPRAY | CUTANEOUS | Status: DC | PRN

## 2022-07-18 MED ORDER — MORPHINE SULFATE (PF) 2 MG/ML IV SOLN
1.0000 mg | INTRAVENOUS | Status: DC | PRN
  Administered 2022-07-18 – 2022-07-19 (×2): 2 mg via INTRAVENOUS
  Filled 2022-07-18 (×2): qty 1

## 2022-07-18 MED ORDER — POTASSIUM CHLORIDE CRYS ER 20 MEQ PO TBCR
40.0000 meq | EXTENDED_RELEASE_TABLET | Freq: Once | ORAL | Status: AC
  Administered 2022-07-18: 40 meq via ORAL
  Filled 2022-07-18: qty 2

## 2022-07-18 MED ORDER — POTASSIUM CHLORIDE CRYS ER 20 MEQ PO TBCR: 40.0000 meq | EXTENDED_RELEASE_TABLET | Freq: Once | ORAL | Status: DC

## 2022-07-18 MED ORDER — HEPARIN SODIUM (PORCINE) 1000 UNIT/ML DIALYSIS: 1000.0000 [IU] | INTRAMUSCULAR | Status: DC | PRN

## 2022-07-18 MED ORDER — LIDOCAINE-PRILOCAINE 2.5-2.5 % EX CREA: 1.0000 | TOPICAL_CREAM | CUTANEOUS | Status: DC | PRN

## 2022-07-18 NOTE — Progress Notes (Signed)
I was notified by nursing staff of pt with recurrent and sustained CP 10/10 after 3 doses of SL NTG. Upon arrival, pt c/o CP 10/10 with no radiation and some SOB. Pt not in distress. BBS CTA. EKG v-paced. Trop earlier 96. Dr. Sidney Ace notified by nursing staff and came to bedside.   2025- HR 70 paced, 105/60 (73), RR 19 with sats 98 % on 3L [place].

## 2022-07-18 NOTE — Progress Notes (Signed)
   07/18/22 1534  Vitals  BP (!) 77/61  MAP (mmHg) 68  Level of Consciousness  Level of Consciousness Alert  MEWS COLOR  MEWS Score Color Yellow  MEWS Score  MEWS Temp 0  MEWS Systolic 2  MEWS Pulse 0  MEWS RR 0  MEWS LOC 0  MEWS Score 2  Provider Notification  Provider Name/Title Irene Pap  Date Provider Notified 07/18/22  Time Provider Notified 1534  Method of Notification Page  Notification Reason Other (Comment) (patient c/o of chest pain. Soft BP)  Provider response See new orders (12 Lead EKG, labs to be ordered, provider to come evaluate)  Date of Provider Response 07/18/22  Time of Provider Response 1535  Note  Observations Primary nurse requesting assistance to help patient from recliner to bed to perform 12 Lead EKG. Patient c/o of 7/10 non radiating chest pain. Patient informed cannot give nitro due to BP

## 2022-07-18 NOTE — Progress Notes (Signed)
Patient c/o of chest pain still. BP 77/61. MD made aware.

## 2022-07-18 NOTE — TOC Progression Note (Signed)
Transition of Care [facility]) - Progression Note    Patient Details  Name: [name] MRN: [id number] Date of Birth: 1950-08-05  [facility]) CM/SW [place], LCSW Phone Number: 07/18/2022, 4:01 PM  Clinical Narrative:    CSW called [facility] to learn of what additional documentation is needed for pt to be admitted. Admission nurse stated that she did not know what documents were needed and that the admissions administrator has been out sick the past couple of days.  CSW will continue to reach out to [employer] to learn of any updates. TOC will continue to follow this admission.    Expected Discharge Plan: [place] Barriers to Discharge: Continued Medical Work up  Expected Discharge Plan and Services In-house Referral: NA Discharge Planning Services: CM Consult Post Acute Care Choice: NA Living arrangements for the past 2 months: Single Family Home Expected Discharge Date: 07/12/22               DME Arranged: N/A DME Agency: NA       HH Arranged: NA           Social Determinants of Health (SDOH) Interventions [place]: Low Risk  (07/13/2022)  Depression (PHQ2-9): Medium Risk (04/03/2020)  Tobacco Use: Medium Risk (07/09/2022)    Readmission Risk Interventions    07/10/2022    2:52 PM  Readmission Risk Prevention Plan  Transportation Screening Complete  PCP or Specialist Appt within 3-5 Days Complete  HRI or [place] Complete  Palliative Care Screening Not Applicable  Medication Review (RN Care Manager) Complete   Beckey Rutter, MSW, LCSWA, LCASA Transitions of Care  Clinical Social Worker I

## 2022-07-18 NOTE — Progress Notes (Signed)
[place] and spoke to [place]. Advised clinic that pt will not start tomorrow and will provide update regarding d/c date once confirmed. Clinic requested pt's Hep B labs which were faxed to clinic today. Will fax d/c summary at d/c for continuation of care.   Melven Sartorius Renal Navigator [phone]

## 2022-07-18 NOTE — Progress Notes (Signed)
OT Cancellation Note  Patient Details Name: [name] MRN: [id number] DOB: [birth year]   Cancelled Treatment:    Reason Eval/Treat Not Completed: Medical issues which prohibited therapy (pt with chest pain and RN reporting SBP in 70s, will hold OT at this time and follow up as appropriate.)  Ulla Gallo 07/18/2022, 3:35 PM

## 2022-07-18 NOTE — Progress Notes (Signed)
Patient called out due to chest pain 6/10. Vitals obtained. 1 SL nitro given. Primary RN made aware.

## 2022-07-18 NOTE — Progress Notes (Signed)
Received a call from bedside RN regarding the patient having chest pain. Presented at bedside.  The patient reports he has chest pain all the time.  States the pain is the same today yesterday and the day before.  States his chest always hurts.  Reproducible on palpation.  Troponin ordered and pending.  2D echo ordered.  On physical exam, no signs of distress.  Resting in bed.  Heart is regular rate and rhythm.  Chest is clear to auscultation.  No pedal edema.  We will continue to closely monitor on telemetry.  Time:  15 minutes

## 2022-07-18 NOTE — Telephone Encounter (Signed)
Per phase I cardiac rehab, fax referral to [facility]

## 2022-07-18 NOTE — Progress Notes (Signed)
Received patient in bed to unit.  Alert and oriented.  Informed consent signed and in chart.   TX duration: 3.0hrs  Patient tolerated well.  Alert, without acute distress.  Hand-off given to patient's nurse.   Access used: L AVF Access issues: None  Total UF removed: 1522m Medication(s) given: None  Post HD weight: 118.4kg   07/18/22 1128  Vitals  Temp 97.9 F (36.6 C)  Temp Source Oral  BP (!) 120/96  MAP (mmHg) 105  BP Location Right Arm  BP Method Automatic  Patient Position (if appropriate) Lying  Pulse Rate 71  Pulse Rate Source Monitor  ECG Heart Rate 70  Resp 19  Oxygen Therapy  SpO2 100 %  O2 Device Room Air  Patient Activity (if Appropriate) In bed  Pulse Oximetry Type Continuous  During Treatment Monitoring  Intra-Hemodialysis Comments Tolerated well  Post Treatment  Dialyzer Clearance Lightly streaked  Duration of HD Treatment -hour(s) 3 hour(s)  Liters Processed 54  Fluid Removed (mL) 1500 mL  Tolerated HD Treatment Yes  AVG/AVF Arterial Site Held (minutes) 7 minutes  AVG/AVF Venous Site Held (minutes) 7 minutes  Fistula / Graft Left Forearm Arteriovenous fistula  Placement Date/Time: (c) 05/06/19 1147   Placed prior to admission: No  Orientation: Left  Access Location: Forearm  Access Type: Arteriovenous fistula  Site Condition No complications  Fistula / Graft Assessment Present;Thrill;Bruit  Status Deaccessed     [facility]

## 2022-07-18 NOTE — Progress Notes (Signed)
Physical Therapy Treatment Patient Details Name: [name] MRN: [id number] DOB: [birth year] Today's Date: 07/18/2022   History of Present Illness Pt is 72 y.o. male admitted 04/09/22  to the ER at [facility] with abdominal pain concerning for peritonitis, transferred to [facility]. Pt with a history of ESRD on PD, CAD, HF with preserved EF, COPD, PD, and HTN.    PT Comments    Pt had fair tolerance to treatment today. Pt was able to transfer from bed to chair with assistance from mobility specialist however continues to be limited by decreased activity tolerance. No change in DC/DME recs. Pt would benefit from continued OOB mobility.  Recommendations for follow up therapy are one component of a multi-disciplinary discharge planning process, led by the attending physician.  Recommendations may be updated based on patient status, additional functional criteria and insurance authorization.  Follow Up Recommendations  Skilled nursing-short term rehab (<3 hours/day) Can patient physically be transported by private vehicle: No   Assistance Recommended at Discharge Frequent or constant Supervision/Assistance  Patient can return home with the following Two people to help with bathing/dressing/bathroom;Assistance with cooking/housework;Assist for transportation;Help with stairs or ramp for entrance;Two people to help with walking and/or transfers;Direct supervision/assist for medications management;Direct supervision/assist for financial management   Equipment Recommendations  Hospital bed;BSC/3in1;Wheelchair (measurements PT)    Recommendations for Other Services       Precautions / Restrictions Precautions Precautions: Fall;Other (comment) Precaution Comments: Now on hemodialysis Restrictions Weight Bearing Restrictions: No     Mobility  Bed Mobility Overal bed mobility: Needs Assistance Bed Mobility: Supine to Sit     Supine to sit: Supervision (after max encouragement)     General  bed mobility comments: Intially self limiting but was able to sit up with supervision after max encouragement and elevated HOB    Transfers Overall transfer level: Needs assistance Equipment used: Rolling walker (2 wheels) Transfers: Sit to/from Stand, Bed to chair/wheelchair/BSC Sit to Stand: +2 physical assistance, Min assist, From elevated surface   Step pivot transfers: Min assist       General transfer comment: Increased time, pt tends to pant once seated EOB and while standing despite VSS and O2 remaining above 95% on RA.    Ambulation/Gait Ambulation/Gait assistance: Min assist, +2 physical assistance Gait Distance (Feet): 5 Feet Assistive device: Rolling walker (2 wheels) Gait Pattern/deviations: Shuffle, Trunk flexed, Drifts right/left Gait velocity: decreased     General Gait Details: Pt appears to be very anxious when ambulating despite being steady with no LOB noted.   Stairs             Wheelchair Mobility    Modified Rankin (Stroke Patients Only)       Balance Overall balance assessment: Mild deficits observed, not formally tested                                          Cognition Arousal/Alertness: Lethargic Behavior During Therapy: Flat affect, Agitated, Anxious Overall Cognitive Status: No family/caregiver present to determine baseline cognitive functioning                                          Exercises      General Comments General comments (skin integrity, edema, etc.): VSS on RA  Pertinent Vitals/Pain Pain Assessment Pain Assessment: Faces Faces Pain Scale: Hurts even more Pain Location: neck Pain Descriptors / Indicators: Discomfort Pain Intervention(s): RN gave pain meds during session    Home Living                          Prior Function            PT Goals (current goals can now be found in the care plan section) Progress towards PT goals: Progressing toward  goals    Frequency    Min 3X/week      PT Plan Current plan remains appropriate    Co-evaluation              AM-PAC PT "6 Clicks" Mobility   Outcome Measure  Help needed turning from your back to your side while in a flat bed without using bedrails?: A Little Help needed moving from lying on your back to sitting on the side of a flat bed without using bedrails?: A Lot Help needed moving to and from a bed to a chair (including a wheelchair)?: A Lot Help needed standing up from a chair using your arms (e.g., wheelchair or bedside chair)?: A Lot Help needed to walk in hospital room?: Total Help needed climbing 3-5 steps with a railing? : Total 6 Click Score: 11    End of Session Equipment Utilized During Treatment: Gait belt Activity Tolerance: Patient limited by lethargy;Patient limited by fatigue Patient left: in chair;with call bell/phone within reach;with chair alarm set Nurse Communication: Mobility status PT Visit Diagnosis: Other abnormalities of gait and mobility (R26.89);Muscle weakness (generalized) (M62.81)     Time: [id number] PT Time Calculation (min) (ACUTE ONLY): 12 min  Charges:  $Therapeutic Activity: 8-22 mins                     Shelby Mattocks, PT, DPT Acute Rehab Services [id number]    Viann Shove 07/18/2022, 4:18 PM

## 2022-07-18 NOTE — Progress Notes (Signed)
[facility] PROGRESS NOTE    Progress Note  [name]  [id number] DOB: [birth year] DOA: 07/07/2022 PCP: Tobe Sos, MD     Brief Narrative:   [name] is an 72 y.o. male past medical history of CAD, AAA, aortic atherosclerosis, atrial flutter, coronary artery disease end-stage renal disease on peritoneal dialysis, COPD, diabetes mellitus type 2 comes into the ED with chief complaint of chest pain that started 2 weeks prior to admission had a cath in 2021 showed significant disease they were unsuccessful in placing a stent so medical management was recommended on aspirin and statins beta-blocker and Plavix.  Status postcardiac cath no good target lesion for PCI cardiology recommended medical management they recommended to add Imdur and continue DAPT therapy. Palliative care discussed with family they would like a second opinion as an outpatient.  07/18/2022: The patient was seen and examined at his bedside while in the hemodialysis center.  He had no new complaints at that time however later in the afternoon he complained of chest pain which was reproducible on palpation.  Improved after sublingual nitroglycerin.   Assessment/Plan:   NSTEMI: Continue aspirin, statins, Coreg, Plavix and Imdur. Patient needs to go to a skilled nursing facility as he is on peritoneal dialysis and they are having a hard time finding a place that can handle peritoneal dialysis while getting rehab. Wife cannot take him home as she has been recently admitted and had a surgical intervention. PT to cont. to work with the patient daily. Start [facility] program. Plan to discharge to [facility] in [place], possibly a bed will be available on Monday.  Chest pain on 07/18/2022 Twelve-lead EKG and troponin ordered. Received 1 dose of sublingual nitroglycerin. Closely monitor on telemetry  A-fib/atrial flutter: Not anticoagulation due to history of anemia and nonbleeding AVMs in June  2022 seen on colonoscopy. Hemoglobin has remained stable while on DAPT therapy.  Heart failure with reduced EF: EDP 18 mmHg continue further management per peritoneal dialysis. Further management per nephrology.  Resolved, symptomatic bradycardia: Status post pacemaker.  End-stage renal disease on peritoneal dialysis: Tolerated hemodialysis well.  Hemodialyze on 07/18/2022.  Anemia of chronic renal disease: Hemoglobin has remained relatively stable continue to monitor intermittently.  Metabolic bone disease: Continue PhosLo BID added.  Hyponatremia, improving: Further management per peritoneal dialysis. Encourage oral protein calorie intake.  Diabetes mellitus type 2 with nephropathy: Controlled on sliding scale insulin continue current management.  COPD: Continue home inhalers.  Hyperlipidemia:  continue statins.  Class I morbid obesity: Follow-up with PCP as an outpatient will need further evaluation for sleep apnea as an outpatient.  Metabolic encephalopathy: Likely due to medication Seroquel and Requip will hold these.  Goals of care: Made DNR palliative care evaluation has been requested  DVT prophylaxis: scd Family Communication:Wife Status is: Inpatient Remains inpatient appropriate because: NSTEMI    Code Status:     Code Status Orders  (From admission, onward)           Start     Ordered   07/08/22 0321  Do not attempt resuscitation (DNR)  Continuous       Question Answer Comment  If patient has no pulse and is not breathing Do Not Attempt Resuscitation   If patient has a pulse and/or is breathing: Medical Treatment Goals LIMITED ADDITIONAL INTERVENTIONS: Use medication/IV fluids and cardiac monitoring as indicated; Do not use intubation or mechanical ventilation (DNI), also provide comfort medications.  Transfer to Progressive/Stepdown as indicated, avoid  Intensive Care.   Consent: Discussion documented in EHR or advanced directives reviewed       07/08/22 0320           Code Status History     Date Active Date Inactive Code Status Order ID Comments User Context   07/07/2022 1946 07/08/2022 0320 Full Code [id number]  Rolla Plate, DO ED   04/09/2022 2023 04/14/2022 1852 Full Code [id number]  Idamae Schuller, MD ED   10/10/2021 0740 10/11/2021 1957 Full Code [id number]  Bernadette Hoit, DO ED   10/08/2020 2241 10/13/2020 1726 Full Code [id number]  Truett Mainland, DO Inpatient   08/02/2019 1436 08/02/2019 2130 Full Code [id number]  Waynetta Sandy, MD Inpatient   07/22/2019 2142 07/25/2019 1615 Full Code [id number]  Phillips Grout, MD ED   04/30/2019 1000 04/30/2019 1348 Full Code [id number]  Elam Dutch, MD Inpatient   04/10/2019 2050 04/13/2019 1425 DNR [id number]  Dewayne Hatch, MD ED   03/18/2018 2324 03/19/2018 1358 Full Code [id number]  Rosetta Posner, MD Inpatient   05/09/2017 1706 05/10/2017 2039 Full Code [id number]  Truett Mainland, DO Inpatient   04/04/2016 1949 04/08/2016 1806 Full Code [id number]  Orvan Falconer, MD Inpatient   09/27/2014 0146 09/28/2014 1733 DNR [id number]  Oswald Hillock, MD Inpatient         IV Access:   Peripheral IV   Procedures and diagnostic studies:   No results found.   Medical Consultants:   None.   Objective:    Vitals:   07/18/22 1200 07/18/22 1520 07/18/22 1523 07/18/22 1534  BP: 119/72 112/60 (!) 102/55 (!) 77/61  Pulse: 70 67 70   Resp: 15     Temp: 97.9 F (36.6 C)     TempSrc: Oral     SpO2: 99%  99%   Weight:      Height:       SpO2: 99 % O2 Flow Rate (L/min): 3 L/min FiO2 (%): 28 %   Intake/Output Summary (Last 24 hours) at 07/18/2022 1546 Last data filed at 07/18/2022 1128 Gross per 24 hour  Intake 0 ml  Output 2400 ml  Net -2400 ml   Filed Weights   07/18/22 0147 07/18/22 0755 07/18/22 1138  Weight: 121.1 kg 120 kg 118.4 kg    Exam: General exam: Well-developed well-nourished s.  He is alert and interactive. Respiratory system: Clear  to auscultation no wheezes or rales. Cardiovascular system: Regular rate and rhythm no rubs or gallops. Gastrointestinal system: Abdomen is nondistended, soft and nontender.  Extremities: No pedal edema. Skin: No rashes, lesions or ulcers Psychiatry: Mood is appropriate for condition   Data Reviewed:    Labs: Basic Metabolic Panel: Recent Labs  Lab 07/12/22 0122 07/13/22 0108 07/14/22 0052 07/17/22 1033 07/18/22 0817  NA 133* 133* 133* 129* 130*  K 2.9* 3.0* 3.2* 3.1* 2.9*  CL 96* 93* 95* 88* 90*  CO2 '24 24 24 26 23  '$ GLUCOSE 167* 167* 169* 162* 96  BUN 63* 64* 61* 69* 74*  CREATININE 6.18* 6.09* 6.66* 7.40* 7.57*  CALCIUM 8.8* 8.6* 8.7* 8.2* 8.2*  PHOS 4.7* 5.6*  --   --  6.4*   GFR Estimated Creatinine Clearance: 12.9 mL/min (A) (by C-G formula based on SCr of 7.57 mg/dL (H)). Liver Function Tests: Recent Labs  Lab 07/12/22 0122 07/13/22 0108 07/18/22 0817  ALBUMIN 2.5* 2.4* 2.2*   No results for input(s): "LIPASE", "AMYLASE" in the last 168 hours. No results for input(s): "AMMONIA" in  the last 168 hours. Coagulation profile No results for input(s): "INR", "PROTIME" in the last 168 hours. COVID-19 Labs  No results for input(s): "DDIMER", "FERRITIN", "LDH", "CRP" in the last 72 hours.  Lab Results  Component Value Date   SARSCOV2NAA NEGATIVE 04/09/2022   [place] NEGATIVE 10/08/2020   [place] NEGATIVE 09/20/2019   [place] NEGATIVE 07/30/2019    CBC: Recent Labs  Lab 07/12/22 0122 07/13/22 0108 07/14/22 0052 07/15/22 0047 07/18/22 0817  WBC 10.9* 11.4* 11.8* 9.3 7.1  HGB 10.5* 10.8* 10.9* 10.8* 10.1*  HCT 30.0* 31.9* 33.0* 32.8* 29.3*  MCV 103.8* 104.6* 106.1* 106.1* 102.8*  PLT 126* 159 158 148* 152   Cardiac Enzymes: No results for input(s): "CKTOTAL", "CKMB", "CKMBINDEX", "TROPONINI" in the last 168 hours. BNP (last 3 results) No results for input(s): "PROBNP" in the last 8760 hours. CBG: Recent Labs  Lab 07/17/22 1613  07/17/22 2102 07/18/22 0223 07/18/22 0605 07/18/22 1221  GLUCAP 86 98 100* 93 107*   D-Dimer: No results for input(s): "DDIMER" in the last 72 hours. Hgb A1c: No results for input(s): "HGBA1C" in the last 72 hours. Lipid Profile: No results for input(s): "CHOL", "HDL", "LDLCALC", "TRIG", "CHOLHDL", "LDLDIRECT" in the last 72 hours.  Thyroid function studies: No results for input(s): "TSH", "T4TOTAL", "T3FREE", "THYROIDAB" in the last 72 hours.  Invalid input(s): "FREET3" Anemia work up: No results for input(s): "VITAMINB12", "FOLATE", "FERRITIN", "TIBC", "IRON", "RETICCTPCT" in the last 72 hours. Sepsis Labs: Recent Labs  Lab 07/13/22 0108 07/14/22 0052 07/15/22 0047 07/18/22 0817  WBC 11.4* 11.8* 9.3 7.1   Microbiology No results found for this or any previous visit (from the past 240 hour(s)).   Medications:    aspirin EC  81 mg Oral Daily   atorvastatin  40 mg Oral Daily   budesonide  2 mL Inhalation BID   calcium citrate  200 mg of elemental calcium Oral Daily   carvedilol  12.5 mg Oral BID WC   Chlorhexidine Gluconate Cloth  6 each Topical Daily   Chlorhexidine Gluconate Cloth  6 each Topical Q0600   clopidogrel  75 mg Oral Daily   enoxaparin (LOVENOX) injection  30 mg Subcutaneous Q24H   gentamicin cream  1 Application Topical Daily   insulin aspart  0-15 Units Subcutaneous TID WC   insulin aspart  0-5 Units Subcutaneous QHS   isosorbide mononitrate  60 mg Oral Daily   multivitamin  1 tablet Oral Daily   pantoprazole  40 mg Oral Daily   sertraline  25 mg Oral Daily   sodium chloride flush  3 mL Intravenous Q12H   sorbitol  60 mL Oral Once   Continuous Infusions:  sodium chloride     dialysis solution 1.5% low-MG/low-CA     nitroGLYCERIN Stopped (07/10/22 0810)      LOS: 10 days   Kayleen Memos  [facility]  07/18/2022, 3:46 PM

## 2022-07-18 NOTE — Progress Notes (Signed)
Patient has arrived back to unit. Message sent to MD about when patient will be able to eat. Patient made NPO yesterday. NPO order still active.

## 2022-07-18 NOTE — Progress Notes (Signed)
Patient assisted from recliner to bed with another staff present. 12 lead EKG performed- Ventricular paced 70 BPM- Dr. [place] Crane informed. Patient rated pain as 7/10, non-radiating. Pain is reproducible when pressed on chest. Patient rated pain as 5/10- Dr. [place] Crane informed. She stated will be at the bedside to assess patient. Patient informed provider will be coming to assess and phlebotomy will be at bedside to draw labs.

## 2022-07-18 NOTE — Progress Notes (Signed)
OT Cancellation Note  Patient Details Name: ERYK CHESLOCK MRN: [id number] DOB: [birth year]   Cancelled Treatment:    Reason Eval/Treat Not Completed: Patient at procedure or test/ unavailable (pt off unit at HD)  Renaye Rakers, OTD, OTR/L SecureChat Preferred Acute Rehab [phone] - [place] 07/18/2022, 8:20 AM

## 2022-07-18 NOTE — Progress Notes (Signed)
[facility] Progress Note    Assessment/ Plan:   Chest pain - hx of CAD. LHC done, pt has lots of diffuse disease w/ no good targets for stenting. Medical Rx per cardiology recs.  ESRD - on PD, lives in [place], [place]. Last PD 2/17-2/28 overnight. Plan is to transition to HD via his LUE AVF given dispo. HD#1 2/29. HD tomorrow given that he has been accepted to [facility] HTN/ volume - initial CXR mild vasc congestion. LVEDP w/ cath was 18, euvolemic per cards note. Is right at dry wt today. Euvolemic on exam. Using all 1.5% fluids.  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 Cont home po vdra. HTN - BP's are low and wt's are down, norvasc d/c'ed and coreg dose lowered. May need some IVF"s if not eating/ drinking well. But not the case currently. Bps are acceptable AMS - per primary Dispo - pt cannot be transferred to a SNF because of his CCPD status, we can not get PD done at a SNF. If he goes home or CIR he can do his PD. If he has to go to SNF, he has LUA AVF that we can use, and we would just need to get him placed in an outpatient unit in [place]. Discussed with primary service 2/27. Plan is to keep working with PT/[facility] program before consideration of snf therefore will c/w PD in the meantime. Discussed with SW later on 2/27, plan is for SNF, transitioning to HD 2/29. Accepted to [facility], can start 3/1  OP PD: f/b [facility], gets PD through [facility] in [place]  Per the wife --> 4 exchanges overnight, 3000 fill, 1.5 hr dwell, use greens/ yellows mostly   Dry wt is 269 lbs/ 122kg   CXR - CM, poss vasc congestion, no edema    Discussed with patient's wife  Subjective:   Patient seen and examined on HD. No issues with cannulation, no complaints. Tolerating treatment thus far. Discussed with primary service at bedside in regards to final dispo plan.   Objective:   BP 102/64   Pulse 70   Temp 98 F (36.7 C) (Oral)    Resp 20   Ht '6\' 4"'$  (1.93 m) Comment: in  Wt 120 kg   SpO2 99%   BMI 32.20 kg/m   Intake/Output Summary (Last 24 hours) at 07/18/2022 [id number] Last data filed at 07/18/2022 0431 Gross per 24 hour  Intake 0 ml  Output 900 ml  Net -900 ml   Weight change: -0.9 kg  Physical Exam: Gen: NAD CVS: RRR Resp: CTA b/l Abd: soft, nt/nd Ext: no edema Neuro: awake, alert Dialysis access: LUE AVF (in use), PD cath c/d/i  Imaging: No results found.  Labs: BMET Recent Labs  Lab 07/12/22 0122 07/13/22 0108 07/14/22 0052 07/17/22 1033 07/18/22 0817  NA 133* 133* 133* 129* 130*  K 2.9* 3.0* 3.2* 3.1* 2.9*  CL 96* 93* 95* 88* 90*  CO2 '24 24 24 26 23  '$ GLUCOSE 167* 167* 169* 162* 96  BUN 63* 64* 61* 69* 74*  CREATININE 6.18* 6.09* 6.66* 7.40* 7.57*  CALCIUM 8.8* 8.6* 8.7* 8.2* 8.2*  PHOS 4.7* 5.6*  --   --  6.4*   CBC Recent Labs  Lab 07/13/22 0108 07/14/22 0052 07/15/22 0047 07/18/22 0817  WBC 11.4* 11.8* 9.3 7.1  HGB 10.8* 10.9* 10.8* 10.1*  HCT 31.9* 33.0* 32.8* 29.3*  MCV 104.6* 106.1* 106.1* 102.8*  PLT 159 158 148* 152    Medications:     aspirin EC  81 mg Oral Daily   atorvastatin  40 mg Oral Daily   budesonide  2 mL Inhalation BID   calcium citrate  200 mg of elemental calcium Oral Daily   carvedilol  12.5 mg Oral BID WC   Chlorhexidine Gluconate Cloth  6 each Topical Daily   Chlorhexidine Gluconate Cloth  6 each Topical Q0600   clopidogrel  75 mg Oral Daily   enoxaparin (LOVENOX) injection  30 mg Subcutaneous Q24H   gentamicin cream  1 Application Topical Daily   insulin aspart  0-15 Units Subcutaneous TID WC   insulin aspart  0-5 Units Subcutaneous QHS   isosorbide mononitrate  60 mg Oral Daily   multivitamin  1 tablet Oral Daily   pantoprazole  40 mg Oral Daily   sertraline  25 mg Oral Daily   sodium chloride flush  3 mL Intravenous Q12H   sorbitol  60 mL Oral Once      Gean Quint, MD [facility] 07/18/2022, 9:33 AM

## 2022-07-19 ENCOUNTER — Inpatient Hospital Stay (HOSPITAL_COMMUNITY): Payer: Medicare Other

## 2022-07-19 DIAGNOSIS — R079 Chest pain, unspecified: Secondary | ICD-10-CM | POA: Diagnosis not present

## 2022-07-19 DIAGNOSIS — N186 End stage renal disease: Secondary | ICD-10-CM

## 2022-07-19 DIAGNOSIS — I214 Non-ST elevation (NSTEMI) myocardial infarction: Secondary | ICD-10-CM

## 2022-07-19 DIAGNOSIS — R109 Unspecified abdominal pain: Secondary | ICD-10-CM

## 2022-07-19 DIAGNOSIS — Z992 Dependence on renal dialysis: Secondary | ICD-10-CM

## 2022-07-19 LAB — GLUCOSE, CAPILLARY
Glucose-Capillary: 86 mg/dL (ref 70–99)
Glucose-Capillary: 145 mg/dL — ABNORMAL HIGH (ref 70–99)
Glucose-Capillary: 125 mg/dL — ABNORMAL HIGH (ref 70–99)

## 2022-07-19 LAB — CBC
HCT: 30.9 % — ABNORMAL LOW (ref 39.0–52.0)
Hemoglobin: 10.5 g/dL — ABNORMAL LOW (ref 13.0–17.0)
MCH: 35.6 pg — ABNORMAL HIGH (ref 26.0–34.0)
MCHC: 34 g/dL (ref 30.0–36.0)
MCV: 104.7 fL — ABNORMAL HIGH (ref 80.0–100.0)
Platelets: 151 10*3/uL (ref 150–400)
RBC: 2.95 MIL/uL — ABNORMAL LOW (ref 4.22–5.81)
RDW: 14.2 % (ref 11.5–15.5)
WBC: 7.3 10*3/uL (ref 4.0–10.5)
nRBC: 0 % (ref 0.0–0.2)

## 2022-07-19 LAB — RENAL FUNCTION PANEL
Albumin: 2.3 g/dL — ABNORMAL LOW (ref 3.5–5.0)
Anion gap: 10 (ref 5–15)
BUN: 45 mg/dL — ABNORMAL HIGH (ref 8–23)
CO2: 27 mmol/L (ref 22–32)
Calcium: 7.8 mg/dL — ABNORMAL LOW (ref 8.9–10.3)
Chloride: 94 mmol/L — ABNORMAL LOW (ref 98–111)
Creatinine, Ser: 5.73 mg/dL — ABNORMAL HIGH (ref 0.61–1.24)
GFR, Estimated: 10 mL/min — ABNORMAL LOW (ref >60)
Glucose, Bld: 124 mg/dL — ABNORMAL HIGH (ref 70–99)
Phosphorus: 3.9 mg/dL (ref 2.5–4.6)
Potassium: 3.3 mmol/L — ABNORMAL LOW (ref 3.5–5.1)
Sodium: 131 mmol/L — ABNORMAL LOW (ref 135–145)

## 2022-07-19 MED ORDER — HEPARIN BOLUS VIA INFUSION
2000.0000 [IU] | Freq: Once | INTRAVENOUS | Status: AC
  Administered 2022-07-19: 2000 [IU] via INTRAVENOUS
  Filled 2022-07-19: qty 2000

## 2022-07-19 MED ORDER — ALBUMIN HUMAN 25 % IV SOLN: 12.5000 g | Freq: Once | INTRAVENOUS | Status: AC

## 2022-07-19 MED ORDER — IOHEXOL 350 MG/ML SOLN
125.0000 mL | Freq: Once | INTRAVENOUS | Status: AC | PRN
  Administered 2022-07-19: 125 mL via INTRAVENOUS

## 2022-07-19 MED ORDER — ALBUMIN HUMAN 25 % IV SOLN
INTRAVENOUS | Status: AC
  Administered 2022-07-19: 25 g
  Filled 2022-07-19: qty 100

## 2022-07-19 MED ORDER — OXYCODONE HCL 5 MG PO TABS
5.0000 mg | ORAL_TABLET | Freq: Four times a day (QID) | ORAL | Status: AC | PRN
  Administered 2022-07-21 (×2): 5 mg via ORAL
  Filled 2022-07-19 (×2): qty 1

## 2022-07-19 MED ORDER — HYDROMORPHONE HCL 1 MG/ML IJ SOLN
0.5000 mg | INTRAMUSCULAR | Status: AC | PRN
  Administered 2022-07-21 – 2022-07-22 (×2): 0.5 mg via INTRAVENOUS
  Filled 2022-07-19 (×2): qty 0.5

## 2022-07-19 NOTE — Consult Note (Signed)
Hospital Consult    Reason for Consult:  Concern for chronic mesenteric ischemia Referring Physician:  Hospitalist, Dr. Nevada Crane MRN #:  [id number]  History of Present Illness: [name] with multiple comorbidities including end-stage renal disease on peritoneal dialysis, diabetes, COPD, hyperlipidemia, obesity admitted with NSTEMI that vascular surgery has been called to evaluate for chronic mesenteric ischemia.  Concern for high-grade stenosis at the celiac and SMA on duplex in November 2023.  Patient tells me his abdominal pain has been ongoing for weeks.  States it is not necessarily after eating.  There are no exacerbating factors.  He looks forward to eating.  No weight loss.  States last night his stomach hurt all over throughout the night persistent.  He had a mesenteric angiogram on 08/02/2019 with similar concerns and there was no significant SMA stenosis and no intervention was performed.  Past Medical History:  Diagnosis Date   AAA (abdominal aortic aneurysm) ([place]) 06/2016   Mild increase in size of 3.4 cm infrarenal abdominal aortic aneurysm   Anemia    Aortic atherosclerosis (HCC)    Arthritis    Asthma    Atrial flutter (HCC)    AVF (arteriovenous fistula) (HCC)    Left forearm   CAD (coronary artery disease)    a. s/p prior stenting of RCA in 1999 b. cath in 2003 showing moderate CAD c. NST in 2016 showing small area of ischemic and low-risk   CHF (congestive heart failure) ([place])    CKD (chronic kidney disease), stage V ([place])    kidney transplant evaluation   COPD (chronic obstructive pulmonary disease) ([place])    with ongoing tobacco use and patient failed sham takes   Diverticulosis 2018   Mild sigmoid colon diverticulosis.   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    Reflux esophagitis    hx   Two-vessel coronary artery disease    moderate. by cath in 2003. Status post stenting of the mdi RCA November 1999 normal left ventricular ejection fraction   Wears glasses    Wears hearing aid    B/L    Past Surgical History:  Procedure Laterality Date   A/V FISTULAGRAM Left 04/30/2019   Procedure: A/V FISTULAGRAM;  Surgeon: Elam Dutch, MD;  Location: [facility];  Service: Cardiovascular;  Laterality: Left;   A/V FISTULAGRAM Left 09/22/2019   Procedure: A/V FISTULAGRAM;  Surgeon: Marty Heck, MD;  Location: [facility];  Service: Cardiovascular;  Laterality: Left;   ABDOMINAL SURGERY     APPENDECTOMY     AV FISTULA PLACEMENT Right 03/11/2018   Procedure: CREATION Brachiocephalic Fistula RIGHT ARM;  Surgeon: Rosetta Posner, MD;  Location: [facility];  Service: Vascular;  Laterality: Right;   AV FISTULA PLACEMENT Left 05/06/2019   Procedure: LEFT BRACHIOCEPHALIC ARTERIOVENOUS (AV) FISTULA CREATION;  Surgeon: Marty Heck, MD;  Location: [facility];  Service: Vascular;  Laterality: Left;   BACK SURGERY     x3; cages in patient's back since 2000   BIOPSY  11/12/2018   Procedure: BIOPSY;  Surgeon: Laurence Spates, MD;  [facility];  Service: Endoscopy;;   CARDIAC CATHETERIZATION     stent   CHOLECYSTECTOMY     CLOSED REDUCTION SHOULDER DISLOCATION     COLONOSCOPY W/ BIOPSIES AND POLYPECTOMY     COLONOSCOPY WITH PROPOFOL N/A 11/12/2018   Procedure: COLONOSCOPY WITH PROPOFOL;  Surgeon: Laurence Spates, MD;  [facility];  Service: Endoscopy;  Laterality: N/A;   COLONOSCOPY WITH PROPOFOL N/A  10/02/2021   Procedure: COLONOSCOPY WITH PROPOFOL;  Surgeon: Clarene Essex, MD;  [facility];  Service: Gastroenterology;  Laterality: N/A;   CORONARY ANGIOPLASTY     CYSTOSCOPY WITH INSERTION OF UROLIFT N/A 11/06/2020   Procedure: CYSTOSCOPY WITH INSERTION OF UROLIFT;  Surgeon: Cleon Gustin, MD;  [facility];  Service: Urology;  Laterality: N/A;   diabetic ulcers     DIAGNOSTIC LAPAROSCOPY     DIALYSIS/PERMA CATHETER REMOVAL N/A 09/22/2019   Procedure: DIALYSIS/PERMA CATHETER REMOVAL;  Surgeon: Marty Heck, MD;  Location: [facility];  Service: Cardiovascular;  Laterality: N/A;   ESOPHAGOGASTRODUODENOSCOPY (EGD) WITH PROPOFOL N/A 11/12/2018   Procedure: ESOPHAGOGASTRODUODENOSCOPY (EGD) WITH PROPOFOL;  Surgeon: Laurence Spates, MD;  [facility];  Service: Endoscopy;  Laterality: N/A;   GROIN EXPLORATION     HEMOSTASIS CLIP PLACEMENT  11/12/2018   Procedure: HEMOSTASIS CLIP PLACEMENT;  Surgeon: Laurence Spates, MD;  [facility];  Service: Endoscopy;;   HIP ARTHROPLASTY Left    gamma nail   INSERT / REPLACE / [place] (AV) ARTEGRAFT ARM Left 03/18/2018   Procedure: INSERTION OF ARTERIOVENOUS (AV) FISTULA;  Surgeon: Rosetta Posner, MD;  Location: [facility];  Service: Vascular;  Laterality: Left;   INTRAVASCULAR PRESSURE WIRE/FFR STUDY N/A 07/08/2022   Procedure: INTRAVASCULAR PRESSURE WIRE/FFR STUDY;  Surgeon: [name], MD;  Location: [facility];  Service: Cardiovascular;  Laterality: N/A;   IR FLUORO GUIDE CV LINE RIGHT  04/11/2019   IR US GUIDE VASC ACCESS RIGHT  04/11/2019   LEFT HEART CATH AND CORONARY ANGIOGRAPHY N/A 07/23/2019   Procedure: LEFT HEART CATH AND CORONARY ANGIOGRAPHY;  Surgeon: Martinique, Peter M, MD;  Location: [facility];  Service: Cardiovascular;  Laterality: N/A;   LEFT HEART CATH AND CORONARY ANGIOGRAPHY N/A 10/10/2021   Procedure: LEFT HEART CATH AND CORONARY  ANGIOGRAPHY;  Surgeon: [name], MD;  Location: [facility];  Service: Cardiovascular;  Laterality: N/A;   LEFT HEART CATH AND CORONARY ANGIOGRAPHY N/A 07/08/2022   Procedure: LEFT HEART CATH AND CORONARY ANGIOGRAPHY;  Surgeon: [name], MD;  Location: [facility];  Service: Cardiovascular;  Laterality: N/A;   LIGATION ARTERIOVENOUS GORTEX GRAFT Right 03/18/2018   Procedure: LIGATION ARTERIOVENOUS FISTULA;  Surgeon: Rosetta Posner, MD;  Location: [facility];  Service: Vascular;  Laterality: Right;   OSTECTOMY Left 04/23/2017   Procedure: OSTECTOMY LEFT GREAT TOE;  Surgeon: [name], DPM;  [facility];  Service: Podiatry;  Laterality: Left;  left great toe   POLYPECTOMY  11/12/2018   Procedure: POLYPECTOMY;  Surgeon: Laurence Spates, MD;  [facility];  Service: Endoscopy;;   POLYPECTOMY  10/02/2021   Procedure: POLYPECTOMY;  Surgeon: Clarene Essex, MD;  [facility];  Service: Gastroenterology;;  SPINAL CORD STIMULATOR INSERTION N/A 12/20/2020   Procedure: Permanent Spinal Cord Stimulator  Lead Implant with Fluoroscopy and Battery Implant;  Surgeon: Reece Agar, MD;  Location: [facility];  Service: Neurosurgery;  Laterality: N/A;   TUMOR REMOVAL     small intestine x3   VISCERAL ANGIOGRAPHY N/A 08/02/2019   Procedure: VISCERAL ANGIOGRAPHY;  Surgeon: Waynetta Sandy, MD;  Location: [facility];  Service: Cardiovascular;  Laterality: N/A;    Allergies  Allergen Reactions   Hydrocodone Itching   Hydrocodone-Acetaminophen Itching   Other     Opioids cause hallucinations and delusions has to be given risperidone to settle down Other reaction(s): hallucinations/delirium  Opiates (Uncoded) Reaction:  Serotonin Syndrome     Oxycodone-Acetaminophen Other (See Comments)    Prior to Admission medications   Medication Sig Start Date End Date Taking? Authorizing Provider  acetaminophen (TYLENOL) 325 MG tablet Take 650 mg by  mouth every 6 (six) hours as needed for mild pain or moderate pain.   Yes [provider]  albuterol (VENTOLIN HFA) 108 (90 Base) MCG/ACT inhaler Inhale 2 puffs into the lungs every 6 (six) hours as needed for wheezing or shortness of breath.    Yes [provider]  amLODipine (NORVASC) 5 MG tablet Take 5 mg by mouth daily.   Yes [provider]  aspirin EC 81 MG EC tablet Take 1 tablet (81 mg total) by mouth daily. 07/25/19  Yes Isaac Bliss, Rayford Halsted, MD  atorvastatin (LIPITOR) 40 MG tablet Take 1 tablet by mouth once daily Patient taking differently: Take 40 mg by mouth every evening. 03/11/22  Yes BranchAlphonse Guild, MD  beclomethasone (QVAR) 80 MCG/ACT inhaler Inhale 2 puffs into the lungs daily as needed (wheezing and shortness of breath).   Yes [provider]  bumetanide (BUMEX) 2 MG tablet Take 2 mg by mouth 2 (two) times daily.   Yes [provider]  calcitRIOL (ROCALTROL) 0.25 MCG capsule Take 0.25 mcg by mouth every Monday, Wednesday, and Friday.   Yes [provider]  CALCIUM CITRATE PO Take 600 mg by mouth in the morning and at bedtime.   Yes [provider]  carvedilol (COREG) 25 MG tablet Take 1 tablet (25 mg total) by mouth 2 (two) times daily with a meal. 09/13/21  Yes Branch, Alphonse Guild, MD  Cholecalciferol (VITAMIN D3) 2000 units TABS Take 2,000 Units by mouth daily.    Yes [provider]  clobetasol (TEMOVATE) 0.05 % external solution Apply 1 application topically 2 (two) times daily as needed (itching).   Yes [provider]  denosumab (PROLIA) 60 MG/ML SOSY injection Inject 60 mg into the skin every 6 (six) months.   Yes [provider]  gentamicin cream (GARAMYCIN) 0.1 % Apply 1 Application topically daily. 04/15/22  Yes Idamae Schuller, MD  linaclotide [facility]) 290 MCG CAPS capsule Take 145-290 mcg by mouth daily as needed (Constipation).   Yes [provider]  magnesium oxide  (MAG-OX) 400 (240 Mg) MG tablet Take 400 mg by mouth daily.   Yes [provider]  Melatonin 10 MG TBDP Take 10 mg by mouth daily.   Yes [provider]  methylPREDNISolone (MEDROL DOSEPAK) 4 MG TBPK tablet Take 4 mg by mouth as directed. Per package directions 07/01/22  Yes [provider]  multivitamin (RENA-VIT) TABS tablet Take 1 tablet by mouth daily.   Yes [provider]  nitroGLYCERIN (NITROSTAT) 0.4 MG SL tablet Place 1 tablet (0.4 mg  total) under the tongue every 5 (five) minutes as needed for chest pain. 10/22/21  Yes Branch, Alphonse Guild, MD  pantoprazole (PROTONIX) 40 MG tablet Take 1 tablet (40 mg total) by mouth daily. 10/11/21 10/11/22 Yes Pahwani, Einar Grad, MD  PLAVIX 75 MG tablet Take 75 mg by mouth daily.   Yes [provider]  polyethylene glycol (MIRALAX / GLYCOLAX) 17 g packet Take 17 g by mouth daily as needed for mild constipation or moderate constipation.   Yes [provider]  potassium chloride SA (KLOR-CON M) 20 MEQ tablet Take 20 mEq by mouth every other day. 01/23/22  Yes [provider]  risperiDONE (RISPERDAL) 0.5 MG tablet Take 0.5 mg by mouth at bedtime.   Yes [provider]  rOPINIRole (REQUIP) 1 MG tablet Take 1 mg by mouth at bedtime. 03/02/21  Yes [provider]  sertraline (ZOLOFT) 25 MG tablet Take 25 mg by mouth in the morning. 09/22/20  Yes [provider]  isosorbide mononitrate (IMDUR) 60 MG 24 hr tablet Take 1 tablet (60 mg total) by mouth daily. 07/12/22   Charlynne Cousins, MD    Social History   Socioeconomic History   Marital status: Married    Spouse name: Not on file   Number of children: 2   Years of education: Not on file   Highest education level: Not on file  Occupational History   Occupation: retired Engineer, structural  Tobacco Use   Smoking status: Former    Years: 40.00    Types: Cigarettes    Start date: 05/20/1968    Quit date: 11/30/2019    Years since  quitting: 2.6   Smokeless tobacco: Never  Vaping Use   Vaping Use: Never used  Substance and Sexual Activity   Alcohol use: Not Currently    Alcohol/week: 0.0 standard drinks of alcohol    Comment: rare   Drug use: No   Sexual activity: Yes  Other Topics Concern   Not on file  Social History Narrative   Patient continues to smoke.    Social Determinants of Health   Financial Resource Strain: Not on file  Food Insecurity: Not on file  Transportation Needs: Not on file  Physical Activity: Not on file  Stress: Not on file  Social Connections: Not on file  Intimate Partner Violence: Not on file     Family History  Problem Relation Age of Onset   Diabetes Mother    Alcohol abuse Father     ROS: '[x]'$  Positive   '[ ]'$  Negative   '[ ]'$  All sytems reviewed and are negative  Cardiovascular: '[]'$  chest pain/pressure '[]'$  palpitations '[]'$  SOB lying flat '[]'$  DOE '[]'$  pain in legs while walking '[]'$  pain in legs at rest '[]'$  pain in legs at night '[]'$  non-healing ulcers '[]'$  hx of DVT '[]'$  swelling in legs  Pulmonary: '[]'$  productive cough '[]'$  asthma/wheezing '[]'$  home O2  Neurologic: '[]'$  weakness in '[]'$  arms '[]'$  legs '[]'$  numbness in '[]'$  arms '[]'$  legs '[]'$  hx of CVA '[]'$  mini stroke '[]'$ difficulty speaking or slurred speech '[]'$  temporary loss of vision in one eye '[]'$  dizziness  Hematologic: '[]'$  hx of cancer '[]'$  bleeding problems '[]'$  problems with blood clotting easily  Endocrine:   '[]'$  diabetes '[]'$  thyroid disease  GI '[]'$  vomiting blood '[]'$  blood in stool  GU: '[]'$  CKD/renal failure '[]'$  HD--'[]'$  M/W/F or '[]'$  T/T/S '[]'$  burning with urination '[]'$  blood in urine  Psychiatric: '[]'$  anxiety '[]'$  depression  Musculoskeletal: '[]'$  arthritis '[]'$   joint pain  Integumentary: '[]'$  rashes '[]'$  ulcers  Constitutional: '[]'$  fever '[]'$  chills   Physical Examination  Vitals:   07/19/22 1308 07/19/22 1627  BP: 112/65 106/62  Pulse: 70 70  Resp: 19 17  Temp:  98.1 F (36.7 C)  SpO2: 96% 98%   Body mass index is 31.77  kg/m.  General:  NAD Gait: Not observed HENT: WNL, normocephalic Pulmonary: normal non-labored breathing Cardiac: regular, without  Murmurs, rubs or gallops Abdomen: soft, NT/ND, no masses Vascular Exam/Pulses: Palpable femoral pulses in both groins Musculoskeletal: no muscle wasting or atrophy  Neurologic: A&O X 3; Appropriate Affect ; SENSATION: normal; MOTOR FUNCTION:  moving all extremities equally. Speech is fluent/normal   CBC    Component Value Date/Time   WBC 7.3 07/19/2022 0904   RBC 2.95 (L) 07/19/2022 0904   HGB 10.5 (L) 07/19/2022 0904   HCT 30.9 (L) 07/19/2022 0904   PLT 151 07/19/2022 0904   MCV 104.7 (H) 07/19/2022 0904   MCH 35.6 (H) 07/19/2022 0904   MCHC 34.0 07/19/2022 0904   RDW 14.2 07/19/2022 0904   LYMPHSABS 1.2 07/07/2022 1704   MONOABS 0.6 07/07/2022 1704   EOSABS 0.0 07/07/2022 1704   BASOSABS 0.0 07/07/2022 1704    BMET    Component Value Date/Time   NA 131 (L) 07/19/2022 0904   NA 142 08/06/2017 1413   K 3.3 (L) 07/19/2022 0904   CL 94 (L) 07/19/2022 0904   CO2 27 07/19/2022 0904   GLUCOSE 124 (H) 07/19/2022 0904   BUN 45 (H) 07/19/2022 0904   BUN 55 (H) 08/06/2017 1413   CREATININE 5.73 (H) 07/19/2022 0904   CREATININE 2.01 (H) 07/28/2015 1606   CALCIUM 7.8 (L) 07/19/2022 0904   CALCIUM 8.3 (L) 04/09/2019 0951   GFRNONAA 10 (L) 07/19/2022 0904   GFRAA 14 (L) 11/23/2019 1223    COAGS: Lab Results  Component Value Date   INR 1.0 04/09/2022   INR 1.2 04/11/2019   INR 1.11 03/19/2018     Non-Invasive Vascular Imaging:      ASSESSMENT/PLAN: [name]  with multiple comorbidities including end-stage renal disease on peritoneal dialysis, diabetes, COPD, hyperlipidemia, obesity admitted with NSTEMI that vascular surgery has been called to evaluate for chronic mesenteric ischemia.  I discussed with Dr. Nevada Crane that he had a mesenteric angiogram on 08/02/2019 when there were similar concerns about a mesenteric duplex with  high-grade stenosis and there was no flow-limiting stenosis identified and no intervention performed on the SMA.  I have recommended a CTA abdomen pelvis to further evaluate.  When I discuss with the patient his symptoms it does not sound like mesenteric ischemia as he denies any food fear, denies weight loss, looks forward to eating, this is not postprandial.  We will follow.  Marty Heck, MD [facility] Office: [place]

## 2022-07-19 NOTE — Progress Notes (Signed)
[facility] Progress Note    Assessment/ Plan:   Chest pain - hx of CAD. LHC done, pt has lots of diffuse disease w/ no good targets for stenting. Medical Rx per cardiology recs. Avoid morphine. Can use fentanyl or dilaudid if needed ESRD - on PD, lives in [place], [place]. Last PD 2/17-2/28 overnight. Plan is to transition to HD via his LUE AVF given dispo. HD#1 2/29. HD on MWF given that he has been accepted to [facility] MWF. Outpatient PD flush also arranged HTN/ volume - initial CXR mild vasc congestion. LVEDP w/ cath was 18, euvolemic per cards note. Is right at dry wt today. Euvolemic on exam. 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 Cont home po vdra. HTN - BP's were low and wt's are down, norvasc d/c'ed and coreg dose lowered. May need some IVF"s if not eating/ drinking well. But not the case currently. Bps are now acceptable AMS - per primary Dispo - Discussed with SW later on 2/27, plan is for SNF, transitioning to HD 2/29. Accepted to [facility] MWF, can start 3/1 but still here. Cannot do PD at SNF. Anticipate D/C on 3/4?  OP PD: f/b [facility], gets PD through [facility] in [place]  Per the wife --> 4 exchanges overnight, 3000 fill, 1.5 hr dwell, use greens/ yellows mostly   Dry wt is 269 lbs/ 122kg   CXR - CM, poss vasc congestion, no edema   Subjective:   Patient seen and examined on HD. No issues with cannulation, no complaints. Had an SBP in the 90's now 130's after UF briefly paused.  Had ongoing chest pain, was ordered morphine. Will stop his morphine given that he is ESRD   Objective:   BP (!) 130/53   Pulse 70   Temp 98.5 F (36.9 C) (Oral)   Resp 17   Ht '6\' 4"'$  (1.93 m) Comment: in  Wt 118.4 kg   SpO2 97%   BMI 31.77 kg/m   Intake/Output Summary (Last 24 hours) at 07/19/2022 [id number] Last data filed at 07/19/2022 0800 Gross per 24 hour  Intake 240 ml  Output 1750 ml  Net -1510 ml   Weight  change: -1.1 kg  Physical Exam: Gen: NAD CVS: RRR Resp: CTA b/l Abd: soft, nt/nd Ext: no edema Neuro: awake, alert Dialysis access: LUE AVF (in use), PD cath c/d/i  Imaging: No results found.  Labs: BMET Recent Labs  Lab 07/13/22 0108 07/14/22 0052 07/17/22 1033 07/18/22 0817 07/18/22 1626  NA 133* 133* 129* 130*  --   K 3.0* 3.2* 3.1* 2.9* 3.5  CL 93* 95* 88* 90*  --   CO2 '24 24 26 23  '$ --   GLUCOSE 167* 169* 162* 96  --   BUN 64* 61* 69* 74*  --   CREATININE 6.09* 6.66* 7.40* 7.57*  --   CALCIUM 8.6* 8.7* 8.2* 8.2*  --   PHOS 5.6*  --   --  6.4*  --    CBC Recent Labs  Lab 07/14/22 0052 07/15/22 0047 07/18/22 0817 07/19/22 0904  WBC 11.8* 9.3 7.1 7.3  HGB 10.9* 10.8* 10.1* 10.5*  HCT 33.0* 32.8* 29.3* 30.9*  MCV 106.1* 106.1* 102.8* 104.7*  PLT 158 148* 152 151    Medications:     aspirin EC  81 mg Oral Daily   atorvastatin  40 mg Oral Daily   budesonide  2 mL Inhalation BID  calcium citrate  200 mg of elemental calcium Oral Daily   carvedilol  12.5 mg Oral BID WC   Chlorhexidine Gluconate Cloth  6 each Topical Daily   Chlorhexidine Gluconate Cloth  6 each Topical Q0600   clopidogrel  75 mg Oral Daily   gentamicin cream  1 Application Topical Daily   heparin injection (subcutaneous)  5,000 Units Subcutaneous Q8H   insulin aspart  0-15 Units Subcutaneous TID WC   insulin aspart  0-5 Units Subcutaneous QHS   isosorbide mononitrate  60 mg Oral Daily   multivitamin  1 tablet Oral Daily   pantoprazole  40 mg Oral Daily   sertraline  25 mg Oral Daily   sodium chloride flush  3 mL Intravenous Q12H   sorbitol  60 mL Oral Once      Gean Quint, MD [facility] 07/19/2022, 9:37 AM

## 2022-07-19 NOTE — Progress Notes (Addendum)
The patient was seen and examined on 07/18/2022 around 8:15 PM.  Subjective: The patient was reportedly having 10/10 chest pain around 8 PM with mild dyspnea.  His EKG showed ventricular paced rhythm and earlier troponin was 53.  No fever or chills.  No nausea or vomiting however when asked about his chest pain he pointed to the epigastric area.  Objective: Physical lamination: Generally: Anxious African-American male in mild respiratory distress Vital signs revealed a BP of 111/70 with a heart rate of 78 respiratory to 29 pulse oximetry was  98% on his 3 L of O2 by nasal cannula. Head - atraumatic, normocephalic.  Pupils - equal, round and reactive to light and accommodation. Extraocular movements are intact. No scleral icterus.  Oropharynx - moist mucous membranes and tongue. No pharyngeal erythema or exudate.  Neck - supple. No JVD. Carotid pulses 2+ bilaterally. No carotid bruits. No palpable thyromegaly or lymphadenopathy. Cardiovascular - regular rate and rhythm. Normal S1 and S2. No murmurs, gallops or rubs.  He had left parasternal tenderness with reproducible pain. Lungs - clear to auscultation bilaterally.  Abdomen - soft with mild epigastric tenderness without event there is guarding or rigidity.   Positive bowel sounds. No palpable organomegaly or masses.  Extremities - no pitting edema, clubbing or cyanosis.  Neuro - grossly non-focal. Skin - no rashes. GU and rectal exam - deferred.  Labs and notes were reviewed.  Assessment/plan: - Chest pain likely atypical musculoskeletal versus GI related with associated epigastric pain. - The patient was ordered as needed IV morphine sulfate and GI cocktail. - We will continue to closely monitor him.  Time spent in the care of the patient: 20 minutes

## 2022-07-19 NOTE — Progress Notes (Signed)
OT Cancellation Note  Patient Details Name: [name] MRN: [id number] DOB: April 11, 1951   Cancelled Treatment:    Reason Eval/Treat Not Completed: Patient at procedure or test/ unavailable. Pt in hemo.  [place] Office [phone]    [name] 07/19/2022, 11:39 AM

## 2022-07-19 NOTE — Progress Notes (Signed)
[place] and spoke to [facility]. Clinic is able to start pt on Monday if pt is stable for d/c to snf over the weekend. Pt will have a MWF schedule. On Mondays and Wednesdays, pt will need to arrive at 3:00 for 3:15 chair time. On Fridays, pt will need to arrive at 2:30 for weekly PD flush prior to HD treatment. This information was provided to [facility] staff earlier this week and will add to AVS as well.   Melven Sartorius Renal Navigator [phone]

## 2022-07-19 NOTE — Progress Notes (Addendum)
Brought in patient into HD unit ,in HD recliner,awake,alert and oriented x 4.SBP was low,Renal MD at bedside when notified.  Access used: Left upper arm fistula that worked well.  Duration of treatment: 3 hours.  Fluid removed 400 cc out of 1,500 cc uf goal prescribed.  Medicine given: Albumin 25 g .                           Heparin 2,000 units.  Hemodialysis issue/comment.Unable to withstand/tolerate HD tx due to low bp .  Hand off to the patient's nurse.

## 2022-07-19 NOTE — Progress Notes (Deleted)
[facility] PROGRESS NOTE    Progress Note  ADDEN HOLLENBACH  [id number] DOB: [birth year] DOA: 07/07/2022 PCP: Tobe Sos, MD     Brief Narrative:   [name] is an 72 y.o. male past medical history of CAD, AAA, aortic atherosclerosis, atrial flutter, coronary artery disease end-stage renal disease on peritoneal dialysis, COPD, diabetes mellitus type 2 comes into the ED with chief complaint of chest pain that started 2 weeks prior to admission had a cath in 2021 showed significant disease they were unsuccessful in placing a stent so medical management was recommended on aspirin and statins beta-blocker and Plavix.  Status postcardiac cath no good target lesion for PCI cardiology recommended medical management they recommended to add Imdur and continue DAPT therapy. Palliative care discussed with family they would like a second opinion as an outpatient.  07/18/2022: The patient was seen and examined at his bedside while in the hemodialysis center.  He had no new complaints at that time however later in the afternoon he complained of chest pain which was reproducible on palpation.  Improved after sublingual nitroglycerin.   Assessment/Plan:   NSTEMI: Continue aspirin, statins, Coreg, Plavix and Imdur. Patient needs to go to a skilled nursing facility as he is on peritoneal dialysis and they are having a hard time finding a place that can handle peritoneal dialysis while getting rehab. Wife cannot take him home as she has been recently admitted and had a surgical intervention. PT to cont. to work with the patient daily. Start [facility] program. Plan to discharge to [facility] in [place], possibly a bed will be available on Monday.  Chest pain on 07/18/2022 Twelve-lead EKG and troponin ordered. Received 1 dose of sublingual nitroglycerin. Closely monitor on telemetry  A-fib/atrial flutter: Not anticoagulation due to history of anemia and nonbleeding AVMs in June  2022 seen on colonoscopy. Hemoglobin has remained stable while on DAPT therapy.  Heart failure with reduced EF: EDP 18 mmHg continue further management per peritoneal dialysis. Further management per nephrology.  Resolved, symptomatic bradycardia: Status post pacemaker.  End-stage renal disease on peritoneal dialysis: Tolerated hemodialysis well.  Hemodialyze on 07/18/2022.  Anemia of chronic renal disease: Hemoglobin has remained relatively stable continue to monitor intermittently.  Metabolic bone disease: Continue PhosLo BID added.  Hyponatremia, improving: Further management per peritoneal dialysis. Encourage oral protein calorie intake.  Diabetes mellitus type 2 with nephropathy: Controlled on sliding scale insulin continue current management.  COPD: Continue home inhalers.  Hyperlipidemia:  continue statins.  Class I morbid obesity: Follow-up with PCP as an outpatient will need further evaluation for sleep apnea as an outpatient.  Metabolic encephalopathy: Likely due to medication Seroquel and Requip will hold these.  Goals of care: Made DNR palliative care evaluation has been requested  DVT prophylaxis: scd Family Communication:Wife Status is: Inpatient Remains inpatient appropriate because: NSTEMI    Code Status:     Code Status Orders  (From admission, onward)           Start     Ordered   07/08/22 0321  Do not attempt resuscitation (DNR)  Continuous       Question Answer Comment  If patient has no pulse and is not breathing Do Not Attempt Resuscitation   If patient has a pulse and/or is breathing: Medical Treatment Goals LIMITED ADDITIONAL INTERVENTIONS: Use medication/IV fluids and cardiac monitoring as indicated; Do not use intubation or mechanical ventilation (DNI), also provide comfort medications.  Transfer to Progressive/Stepdown as indicated, avoid  Intensive Care.   Consent: Discussion documented in EHR or advanced directives reviewed       07/08/22 0320           Code Status History     Date Active Date Inactive Code Status Order ID Comments User Context   07/07/2022 1946 07/08/2022 0320 Full Code [id number]  Rolla Plate, DO ED   04/09/2022 2023 04/14/2022 1852 Full Code [id number]  Idamae Schuller, MD ED   10/10/2021 0740 10/11/2021 1957 Full Code [id number]  Bernadette Hoit, DO ED   10/08/2020 2241 10/13/2020 1726 Full Code [id number]  Truett Mainland, DO Inpatient   08/02/2019 1436 08/02/2019 2130 Full Code [id number]  Waynetta Sandy, MD Inpatient   07/22/2019 2142 07/25/2019 1615 Full Code [id number]  Phillips Grout, MD ED   04/30/2019 1000 04/30/2019 1348 Full Code [id number]  Elam Dutch, MD Inpatient   04/10/2019 2050 04/13/2019 1425 DNR [id number]  Dewayne Hatch, MD ED   03/18/2018 2324 03/19/2018 1358 Full Code [id number]  Rosetta Posner, MD Inpatient   05/09/2017 1706 05/10/2017 2039 Full Code [id number]  Truett Mainland, DO Inpatient   04/04/2016 1949 04/08/2016 1806 Full Code [id number]  Orvan Falconer, MD Inpatient   09/27/2014 0146 09/28/2014 1733 DNR [id number]  Oswald Hillock, MD Inpatient         IV Access:   Peripheral IV   Procedures and diagnostic studies:   No results found.   Medical Consultants:   None.   Objective:    Vitals:   07/19/22 1201 07/19/22 1220 07/19/22 1308 07/19/22 1627  BP: (!) 95/59 128/62 112/65 106/62  Pulse: 69 70 70 70  Resp: (!) '22 12 19 17  '$ Temp:  98 F (36.7 C)  98.1 F (36.7 C)  TempSrc:    Oral  SpO2: 96% 95% 96% 98%  Weight:      Height:       SpO2: 98 % O2 Flow Rate (L/min): 2 L/min FiO2 (%): 28 %   Intake/Output Summary (Last 24 hours) at 07/19/2022 1655 Last data filed at 07/19/2022 1452 Gross per 24 hour  Intake 420 ml  Output 650 ml  Net -230 ml   Filed Weights   07/18/22 0147 07/18/22 0755 07/18/22 1138  Weight: 121.1 kg 120 kg 118.4 kg    Exam: General exam: Well-developed well-nourished s.  He is alert and  interactive. Respiratory system: Clear to auscultation no wheezes or rales. Cardiovascular system: Regular rate and rhythm no rubs or gallops. Gastrointestinal system: Abdomen is nondistended, soft and nontender.  Extremities: No pedal edema. Skin: No rashes, lesions or ulcers Psychiatry: Mood is appropriate for condition   Data Reviewed:    Labs: Basic Metabolic Panel: Recent Labs  Lab 07/13/22 0108 07/14/22 0052 07/17/22 1033 07/18/22 0817 07/18/22 1626 07/19/22 0904  NA 133* 133* 129* 130*  --  131*  K 3.0* 3.2* 3.1* 2.9* 3.5 3.3*  CL 93* 95* 88* 90*  --  94*  CO2 '24 24 26 23  '$ --  27  GLUCOSE 167* 169* 162* 96  --  124*  BUN 64* 61* 69* 74*  --  45*  CREATININE 6.09* 6.66* 7.40* 7.57*  --  5.73*  CALCIUM 8.6* 8.7* 8.2* 8.2*  --  7.8*  MG  --   --   --   --  2.4  --   PHOS 5.6*  --   --  6.4*  --  3.9   GFR Estimated Creatinine Clearance: 17.1 mL/min (  A) (by C-G formula based on SCr of 5.73 mg/dL (H)). Liver Function Tests: Recent Labs  Lab 07/13/22 0108 07/18/22 0817 07/19/22 0904  ALBUMIN 2.4* 2.2* 2.3*   No results for input(s): "LIPASE", "AMYLASE" in the last 168 hours. No results for input(s): "AMMONIA" in the last 168 hours. Coagulation profile No results for input(s): "INR", "PROTIME" in the last 168 hours. COVID-19 Labs  No results for input(s): "DDIMER", "FERRITIN", "LDH", "CRP" in the last 72 hours.  Lab Results  Component Value Date   SARSCOV2NAA NEGATIVE 04/09/2022   [place] NEGATIVE 10/08/2020   [place] NEGATIVE 09/20/2019   [place] NEGATIVE 07/30/2019    CBC: Recent Labs  Lab 07/13/22 0108 07/14/22 0052 07/15/22 0047 07/18/22 0817 07/19/22 0904  WBC 11.4* 11.8* 9.3 7.1 7.3  HGB 10.8* 10.9* 10.8* 10.1* 10.5*  HCT 31.9* 33.0* 32.8* 29.3* 30.9*  MCV 104.6* 106.1* 106.1* 102.8* 104.7*  PLT 159 158 148* 152 151   Cardiac Enzymes: No results for input(s): "CKTOTAL", "CKMB", "CKMBINDEX", "TROPONINI" in the last 168  hours. BNP (last 3 results) No results for input(s): "PROBNP" in the last 8760 hours. CBG: Recent Labs  Lab 07/18/22 1625 07/18/22 2023 07/18/22 2119 07/19/22 0601 07/19/22 1625  GLUCAP 150* 120* 126* 86 145*   D-Dimer: No results for input(s): "DDIMER" in the last 72 hours. Hgb A1c: No results for input(s): "HGBA1C" in the last 72 hours. Lipid Profile: No results for input(s): "CHOL", "HDL", "LDLCALC", "TRIG", "CHOLHDL", "LDLDIRECT" in the last 72 hours.  Thyroid function studies: No results for input(s): "TSH", "T4TOTAL", "T3FREE", "THYROIDAB" in the last 72 hours.  Invalid input(s): "FREET3" Anemia work up: No results for input(s): "VITAMINB12", "FOLATE", "FERRITIN", "TIBC", "IRON", "RETICCTPCT" in the last 72 hours. Sepsis Labs: Recent Labs  Lab 07/14/22 0052 07/15/22 0047 07/18/22 0817 07/19/22 0904  WBC 11.8* 9.3 7.1 7.3   Microbiology No results found for this or any previous visit (from the past 240 hour(s)).   Medications:    aspirin EC  81 mg Oral Daily   atorvastatin  40 mg Oral Daily   budesonide  2 mL Inhalation BID   calcium citrate  200 mg of elemental calcium Oral Daily   carvedilol  12.5 mg Oral BID WC   Chlorhexidine Gluconate Cloth  6 each Topical Daily   Chlorhexidine Gluconate Cloth  6 each Topical Q0600   clopidogrel  75 mg Oral Daily   gentamicin cream  1 Application Topical Daily   heparin  2,000 Units Intravenous Once   heparin injection (subcutaneous)  5,000 Units Subcutaneous Q8H   insulin aspart  0-15 Units Subcutaneous TID WC   insulin aspart  0-5 Units Subcutaneous QHS   isosorbide mononitrate  60 mg Oral Daily   multivitamin  1 tablet Oral Daily   pantoprazole  40 mg Oral Daily   sertraline  25 mg Oral Daily   sodium chloride flush  3 mL Intravenous Q12H   Continuous Infusions:  sodium chloride     nitroGLYCERIN Stopped (07/10/22 0810)      LOS: 11 days   Kayleen Memos  [facility]  07/19/2022, 4:55 PM

## 2022-07-19 NOTE — Progress Notes (Signed)
[facility] PROGRESS NOTE    Progress Note  [name]  [id number] DOB: [birth year] DOA: 07/07/2022 PCP: Tobe Sos, MD     Brief Narrative:   [name] is an 72 y.o. 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  On aspirin, Plavix, beta-blocker, and statins prior to admission.  Workup revealed NSTEMI.  Was seen by cardiology, post cardiac cath with PCI.  Per cardiology: "Patient has occluded sub branch of the first diagonal. The LCx is occluded. There is moderate disease in the proximal and distal RCA but FFR was OK. There is obstructive disease in the diagonal but this is fairly small in caliber and severely calcified. In short there are no good targets for PCI. We could potentially treat the diagonal but long term patency of this would be poor especially with heavy calcification and ESRD. Will push medical therapy."   Seen by palliative care team, outpatient palliative to follow-up once discharged.  07/19/2022: The patient was seen and examined at his bedside at hemodialysis center.  Complains of epigastric pain 6-7 out of 10.  Per his wife via telephone, history of superior mesenteric artery stenosis and concerned this could be the cause of his chronic intermittent abdominal pain.  CTA abdomen and pelvis ordered, if positive for high-grade stenosis, consult vascular surgery, discussed with Dr. Carlis Abbott.   Assessment/Plan:   NSTEMI: Continue aspirin, statins, Coreg, Plavix  and Imdur. Plan to discharge to SNF, [facility] in [place]. Plan to continue with hemodialysis Monday Wednesday Friday at [facility] in [place]. Possible discharge to home on Monday.  Epigastric pain in the setting of celiac artery/superior mesenteric artery stenosis greater than 70% from mesenteric ultrasound. CTA abdomen pelvis ordered and pending, follow results. If significant artery stenosis consult vascular surgery.  A-fib/atrial flutter, not on oral anticoagulation: Not anticoagulation due to history of anemia and nonbleeding AVMs in June 2022 seen on colonoscopy. Hemoglobin has remained stable while on DAPT therapy. Continue to monitor on telemetry  HFpEF Continue home regimen.  Resolved, symptomatic bradycardia: Status post pacemaker.  End-stage renal disease on peritoneal dialysis: Tolerated hemodialysis well.  Hemodialyze on 07/18/2022. Hemodialysis again on 07/19/2022.  Anemia of chronic renal disease: Hemoglobin has remained relatively stable continue to monitor intermittently. No overt bleeding reported  Metabolic bone disease: Continue PhosLo BID added.  Hyponatremia, improving: Increase oral protein calorie intake.  Diabetes mellitus type 2 with nephropathy: Controlled on sliding scale insulin Continue current management.  COPD: Continue home inhalers.  Hyperlipidemia:  continue statins.  Class I morbid obesity: Follow-up with PCP as an outpatient will need further evaluation for sleep apnea as an outpatient.  Resolved metabolic encephalopathy:  Goals of care: Made DNR  Seen by palliative care team  DVT prophylaxis: Subcu heparin 3 times daily Family Communication: Updated his wife via phone. Status is: Inpatient Remains inpatient appropriate because: NSTEMI, epigastric pain.    Code Status:     Code Status Orders  (From admission, onward)           Start  Ordered   07/08/22 0321  Do not attempt resuscitation  (DNR)  Continuous       Question Answer Comment  If patient has no pulse and is not breathing Do Not Attempt Resuscitation   If patient has a pulse and/or is breathing: Medical Treatment Goals LIMITED ADDITIONAL INTERVENTIONS: Use medication/IV fluids and cardiac monitoring as indicated; Do not use intubation or mechanical ventilation (DNI), also provide comfort medications.  Transfer to Progressive/Stepdown as indicated, avoid Intensive Care.   Consent: Discussion documented in EHR or advanced directives reviewed      07/08/22 0320           Code Status History     Date Active Date Inactive Code Status Order ID Comments User Context   07/07/2022 1946 07/08/2022 0320 Full Code [id number]  Rolla Plate, DO ED   04/09/2022 2023 04/14/2022 1852 Full Code [id number]  Idamae Schuller, MD ED   10/10/2021 0740 10/11/2021 1957 Full Code [id number]  Bernadette Hoit, DO ED   10/08/2020 2241 10/13/2020 1726 Full Code [id number]  Truett Mainland, DO Inpatient   08/02/2019 1436 08/02/2019 2130 Full Code [id number]  Waynetta Sandy, MD Inpatient   07/22/2019 2142 07/25/2019 1615 Full Code [id number]  Phillips Grout, MD ED   04/30/2019 1000 04/30/2019 1348 Full Code [id number]  Elam Dutch, MD Inpatient   04/10/2019 2050 04/13/2019 1425 DNR [id number]  Dewayne Hatch, MD ED   03/18/2018 2324 03/19/2018 1358 Full Code [id number]  Rosetta Posner, MD Inpatient   05/09/2017 1706 05/10/2017 2039 Full Code [id number]  Truett Mainland, DO Inpatient   04/04/2016 1949 04/08/2016 1806 Full Code [id number]  Orvan Falconer, MD Inpatient   09/27/2014 0146 09/28/2014 1733 DNR [id number]  Oswald Hillock, MD Inpatient         IV Access:   Peripheral IV   Procedures and diagnostic studies:   No results found.   Medical Consultants:   None.   Objective:    Vitals:   07/19/22 1201 07/19/22 1220 07/19/22 1308 07/19/22 1627  BP: (!) 95/59 128/62 112/65 106/62  Pulse: 69 70 70 70  Resp: (!) '22 12  19 17  '$ Temp:  98 F (36.7 C)  98.1 F (36.7 C)  TempSrc:    Oral  SpO2: 96% 95% 96% 98%  Weight:      Height:       SpO2: 98 % O2 Flow Rate (L/min): 2 L/min FiO2 (%): 28 %   Intake/Output Summary (Last 24 hours) at 07/19/2022 1718 Last data filed at 07/19/2022 1452 Gross per 24 hour  Intake 420 ml  Output 650 ml  Net -230 ml   Filed Weights   07/18/22 0147 07/18/22 0755 07/18/22 1138  Weight: 121.1 kg 120 kg 118.4 kg    Exam: General exam: Well-developed well-nourished no acute distress.  He is alert and interactive. Respiratory system: Clear to auscultation no wheezes or rales.  Cardiovascular system: Regular rate and rhythm no rubs or gallops.. Gastrointestinal system: Abdomen is nondistended, soft.  Bowel sounds present.  Pain to epigastric region.   Extremities: No pedal edema. Skin: No rashes, lesions or ulcers Psychiatry: Mood is appropriate for condition 7.   Data Reviewed:    Labs: Basic Metabolic Panel: Recent Labs  Lab 07/13/22 0108 07/14/22 0052 07/17/22 1033 07/18/22 0817 07/18/22 1626 07/19/22 0904  NA 133* 133* 129* 130*  --  131*  K 3.0* 3.2* 3.1* 2.9* 3.5 3.3*  CL 93* 95* 88* 90*  --  94*  CO2 '24 24 26 23  '$ --  27  GLUCOSE 167* 169* 162* 96  --  124*  BUN 64* 61* 69* 74*  --  45*  CREATININE 6.09* 6.66* 7.40* 7.57*  --  5.73*  CALCIUM 8.6* 8.7* 8.2* 8.2*  --  7.8*  MG  --   --   --   --  2.4  --   PHOS 5.6*  --   --  6.4*  --  3.9   GFR Estimated Creatinine Clearance: 17.1 mL/min (A) (by C-G formula based on SCr of 5.73 mg/dL (H)). Liver Function Tests: Recent Labs  Lab 07/13/22 0108 07/18/22 0817 07/19/22 0904  ALBUMIN 2.4* 2.2* 2.3*   No results for input(s): "LIPASE", "AMYLASE" in the last 168 hours. No results for input(s): "AMMONIA" in the last 168 hours. Coagulation profile No results for input(s): "INR", "PROTIME" in the last 168 hours. COVID-19 Labs  No results for input(s): "DDIMER", "FERRITIN", "LDH", "CRP" in the last 72  hours.  Lab Results  Component Value Date   SARSCOV2NAA NEGATIVE 04/09/2022   [place] NEGATIVE 10/08/2020   [place] NEGATIVE 09/20/2019   [place] NEGATIVE 07/30/2019    CBC: Recent Labs  Lab 07/13/22 0108 07/14/22 0052 07/15/22 0047 07/18/22 0817 07/19/22 0904  WBC 11.4* 11.8* 9.3 7.1 7.3  HGB 10.8* 10.9* 10.8* 10.1* 10.5*  HCT 31.9* 33.0* 32.8* 29.3* 30.9*  MCV 104.6* 106.1* 106.1* 102.8* 104.7*  PLT 159 158 148* 152 151   Cardiac Enzymes: No results for input(s): "CKTOTAL", "CKMB", "CKMBINDEX", "TROPONINI" in the last 168 hours. BNP (last 3 results) No results for input(s): "PROBNP" in the last 8760 hours. CBG: Recent Labs  Lab 07/18/22 1625 07/18/22 2023 07/18/22 2119 07/19/22 0601 07/19/22 1625  GLUCAP 150* 120* 126* 86 145*   D-Dimer: No results for input(s): "DDIMER" in the last 72 hours. Hgb A1c: No results for input(s): "HGBA1C" in the last 72 hours. Lipid Profile: No results for input(s): "CHOL", "HDL", "LDLCALC", "TRIG", "CHOLHDL", "LDLDIRECT" in the last 72 hours.  Thyroid function studies: No results for input(s): "TSH", "T4TOTAL", "T3FREE", "THYROIDAB" in the last 72 hours.  Invalid input(s): "FREET3" Anemia work up: No results for input(s): "VITAMINB12", "FOLATE", "FERRITIN", "TIBC", "IRON", "RETICCTPCT" in the last 72 hours. Sepsis Labs: Recent Labs  Lab 07/14/22 0052 07/15/22 0047 07/18/22 0817 07/19/22 0904  WBC 11.8* 9.3 7.1 7.3   Microbiology No results found for this or any previous visit (from the past 240 hour(s)).   Medications:    aspirin EC  81 mg Oral Daily   atorvastatin  40 mg Oral Daily   budesonide  2 mL Inhalation BID   calcium citrate  200 mg of elemental calcium Oral Daily   carvedilol  12.5 mg Oral BID WC   Chlorhexidine Gluconate Cloth  6 each Topical Daily   Chlorhexidine Gluconate Cloth  6 each Topical Q0600   clopidogrel  75 mg Oral Daily   gentamicin cream  1 Application Topical Daily    heparin  2,000 Units Intravenous Once   heparin injection (subcutaneous)  5,000 Units Subcutaneous Q8H   insulin aspart  0-15 Units Subcutaneous TID WC   insulin aspart  0-5 Units Subcutaneous QHS   isosorbide mononitrate  60 mg Oral Daily   multivitamin  1 tablet Oral Daily   pantoprazole  40 mg Oral Daily   sertraline  25 mg Oral Daily   sodium chloride flush  3 mL Intravenous Q12H   Continuous Infusions:  sodium chloride  nitroGLYCERIN Stopped (07/10/22 0810)      LOS: 11 days   Kayleen Memos  [facility]  07/19/2022, 5:18 PM

## 2022-07-20 ENCOUNTER — Inpatient Hospital Stay (HOSPITAL_COMMUNITY): Payer: Medicare Other

## 2022-07-20 DIAGNOSIS — R079 Chest pain, unspecified: Secondary | ICD-10-CM | POA: Diagnosis not present

## 2022-07-20 LAB — CBC
HCT: 30.8 % — ABNORMAL LOW (ref 39.0–52.0)
Hemoglobin: 10 g/dL — ABNORMAL LOW (ref 13.0–17.0)
MCH: 34.5 pg — ABNORMAL HIGH (ref 26.0–34.0)
MCHC: 32.5 g/dL (ref 30.0–36.0)
MCV: 106.2 fL — ABNORMAL HIGH (ref 80.0–100.0)
Platelets: 156 10*3/uL (ref 150–400)
RBC: 2.9 MIL/uL — ABNORMAL LOW (ref 4.22–5.81)
RDW: 14.2 % (ref 11.5–15.5)
WBC: 7.2 10*3/uL (ref 4.0–10.5)
nRBC: 0 % (ref 0.0–0.2)

## 2022-07-20 LAB — ECHOCARDIOGRAM COMPLETE
Area-P 1/2: 3.74 cm2
Height: 76 in
S' Lateral: 3.3 cm
Weight: 4119.96 oz

## 2022-07-20 LAB — GLUCOSE, CAPILLARY
Glucose-Capillary: 108 mg/dL — ABNORMAL HIGH (ref 70–99)
Glucose-Capillary: 108 mg/dL — ABNORMAL HIGH (ref 70–99)
Glucose-Capillary: 117 mg/dL — ABNORMAL HIGH (ref 70–99)
Glucose-Capillary: 108 mg/dL — ABNORMAL HIGH (ref 70–99)

## 2022-07-20 LAB — RENAL FUNCTION PANEL
Albumin: 2.4 g/dL — ABNORMAL LOW (ref 3.5–5.0)
Anion gap: 8 (ref 5–15)
BUN: 31 mg/dL — ABNORMAL HIGH (ref 8–23)
CO2: 31 mmol/L (ref 22–32)
Calcium: 7.8 mg/dL — ABNORMAL LOW (ref 8.9–10.3)
Chloride: 93 mmol/L — ABNORMAL LOW (ref 98–111)
Creatinine, Ser: 4.42 mg/dL — ABNORMAL HIGH (ref 0.61–1.24)
GFR, Estimated: 14 mL/min — ABNORMAL LOW (ref >60)
Glucose, Bld: 106 mg/dL — ABNORMAL HIGH (ref 70–99)
Phosphorus: 3.1 mg/dL (ref 2.5–4.6)
Potassium: 3.8 mmol/L (ref 3.5–5.1)
Sodium: 132 mmol/L — ABNORMAL LOW (ref 135–145)

## 2022-07-20 NOTE — Consult Note (Signed)
Referring Provider: Dr. Manuella Ghazi Primary Care Physician:  Tobe Sos, MD Primary Gastroenterologist:  Dr. Watt Climes  Reason for Consultation:  Abdominal pain  HPI: [name] is a 72 y.o. male with history of abdominal pain who has been on peritoneal dialysis for about 2 years seen for a consult due to ongoing abdominal pain. Reports diffuse abdominal pain that has been constant for the past 3 weeks. Pain is NOT associated with eating or BMs. Pain is 7/10 in intensity at its worst and is currently 3/10 in intensity. Eating well without N/V. Good appetite. Feels like this pain is similar to pain he has had at times for years. History of constipation but denies any recent straining or change in his BMs. Has a BM every 3 days. Denies rectal bleeding. Colonoscopy in May 2023 showed hemorrhoids and 5 tubular adenomas were removed. EGD in 2020 was unrevealing. Patient sleeping comfortably on his right side when I came to evaluate him.  Past Medical History:  Diagnosis Date   AAA (abdominal aortic aneurysm) ([place]) 06/2016   Mild increase in size of 3.4 cm infrarenal abdominal aortic aneurysm   Anemia    Aortic atherosclerosis (HCC)    Arthritis    Asthma    Atrial flutter (HCC)    AVF (arteriovenous fistula) (HCC)    Left forearm   CAD (coronary artery disease)    a. s/p prior stenting of RCA in 1999 b. cath in 2003 showing moderate CAD c. NST in 2016 showing small area of ischemic and low-risk   CHF (congestive heart failure) ([place])    CKD (chronic kidney disease), stage V ([place])    kidney transplant evaluation   COPD (chronic obstructive pulmonary disease) ([place])    with ongoing tobacco use and patient failed sham takes   Diverticulosis 2018   Mild sigmoid colon diverticulosis.   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    Reflux esophagitis    hx   Two-vessel coronary artery disease    moderate. by cath in 2003. Status post stenting of the mdi RCA November 1999 normal left ventricular ejection fraction   Wears glasses    Wears hearing aid    B/L    Past Surgical History:  Procedure Laterality Date   A/V FISTULAGRAM Left 04/30/2019   Procedure: A/V FISTULAGRAM;  Surgeon: Elam Dutch, MD;  Location: [place] [facility];  Service: Cardiovascular;  Laterality: Left;   A/V FISTULAGRAM Left 09/22/2019   Procedure: A/V FISTULAGRAM;  Surgeon: Marty Heck, MD;  Location: [place] [facility];  Service: Cardiovascular;  Laterality: Left;   ABDOMINAL SURGERY     APPENDECTOMY     AV FISTULA PLACEMENT Right 03/11/2018   Procedure: CREATION Brachiocephalic Fistula RIGHT ARM;  Surgeon: Rosetta Posner, MD;  Location: [facility];  Service: Vascular;  Laterality: Right;   AV FISTULA PLACEMENT Left 05/06/2019  Procedure: LEFT BRACHIOCEPHALIC ARTERIOVENOUS (AV) FISTULA CREATION;  Surgeon: Marty Heck, MD;  Location: [place];  Service: Vascular;  Laterality: Left;   BACK SURGERY     x3; cages in patient's back since 2000   BIOPSY  11/12/2018   Procedure: BIOPSY;  Surgeon: Laurence Spates, MD;  [facility]: [facility];  Service: Endoscopy;;   CARDIAC CATHETERIZATION     stent   CHOLECYSTECTOMY     CLOSED REDUCTION SHOULDER DISLOCATION     COLONOSCOPY W/ BIOPSIES AND POLYPECTOMY     COLONOSCOPY WITH PROPOFOL N/A 11/12/2018   Procedure: COLONOSCOPY WITH PROPOFOL;  Surgeon: Laurence Spates, MD;  [facility];  Service: Endoscopy;  Laterality: N/A;   COLONOSCOPY  WITH PROPOFOL N/A 10/02/2021   Procedure: COLONOSCOPY WITH PROPOFOL;  Surgeon: Clarene Essex, MD;  [facility];  Service: Gastroenterology;  Laterality: N/A;   CORONARY ANGIOPLASTY     CYSTOSCOPY WITH INSERTION OF UROLIFT N/A 11/06/2020   Procedure: CYSTOSCOPY WITH INSERTION OF UROLIFT;  Surgeon: Cleon Gustin, MD;  [facility];  Service: Urology;  Laterality: N/A;   diabetic ulcers     DIAGNOSTIC LAPAROSCOPY     DIALYSIS/PERMA CATHETER REMOVAL N/A 09/22/2019   Procedure: DIALYSIS/PERMA CATHETER REMOVAL;  Surgeon: Marty Heck, MD;  Location: [place] [facility];  Service: Cardiovascular;  Laterality: N/A;   ESOPHAGOGASTRODUODENOSCOPY (EGD) WITH PROPOFOL N/A 11/12/2018   Procedure: ESOPHAGOGASTRODUODENOSCOPY (EGD) WITH PROPOFOL;  Surgeon: Laurence Spates, MD;  [facility];  Service: Endoscopy;  Laterality: N/A;   GROIN EXPLORATION     HEMOSTASIS CLIP PLACEMENT  11/12/2018   Procedure: HEMOSTASIS CLIP PLACEMENT;  Surgeon: Laurence Spates, MD;  [facility];  Service: Endoscopy;;   HIP ARTHROPLASTY Left    gamma nail   INSERT / REPLACE / [place] (AV) ARTEGRAFT ARM Left 03/18/2018   Procedure: INSERTION OF ARTERIOVENOUS (AV) FISTULA;  Surgeon: Rosetta Posner, MD;  Location: [place];  Service: Vascular;  Laterality: Left;   INTRAVASCULAR PRESSURE WIRE/FFR STUDY N/A 07/08/2022   Procedure: INTRAVASCULAR PRESSURE WIRE/FFR STUDY;  Surgeon: [name], MD;  Location: [place] [facility];  Service: Cardiovascular;  Laterality: N/A;   IR FLUORO GUIDE CV LINE RIGHT  04/11/2019   IR US GUIDE VASC ACCESS RIGHT  04/11/2019   LEFT HEART CATH AND CORONARY ANGIOGRAPHY N/A 07/23/2019   Procedure: LEFT HEART CATH AND CORONARY ANGIOGRAPHY;  Surgeon: Martinique, Peter M, MD;  Location: [place] [facility];  Service: Cardiovascular;  Laterality: N/A;   LEFT HEART CATH AND CORONARY ANGIOGRAPHY N/A 10/10/2021   Procedure: LEFT HEART  CATH AND CORONARY ANGIOGRAPHY;  Surgeon: [name], MD;  Location: [place] [facility];  Service: Cardiovascular;  Laterality: N/A;   LEFT HEART CATH AND CORONARY ANGIOGRAPHY N/A 07/08/2022   Procedure: LEFT HEART CATH AND CORONARY ANGIOGRAPHY;  Surgeon: [name], MD;  Location: [place] [facility];  Service: Cardiovascular;  Laterality: N/A;   LIGATION ARTERIOVENOUS GORTEX GRAFT Right 03/18/2018   Procedure: LIGATION ARTERIOVENOUS FISTULA;  Surgeon: Rosetta Posner, MD;  Location: [facility];  Service: Vascular;  Laterality: Right;   OSTECTOMY Left 04/23/2017   Procedure: OSTECTOMY LEFT GREAT TOE;  Surgeon: [name], DPM;  [facility];  Service: Podiatry;  Laterality: Left;  left great toe   POLYPECTOMY  11/12/2018   Procedure: POLYPECTOMY;  Surgeon: Laurence Spates, MD;  [facility];  Service: Endoscopy;;   POLYPECTOMY  10/02/2021   Procedure: POLYPECTOMY;  Surgeon: Clarene Essex, MD;  Location: [facility];  Service: Gastroenterology;;   SPINAL CORD STIMULATOR INSERTION N/A 12/20/2020   Procedure: Permanent Spinal Cord Stimulator  Lead Implant with Fluoroscopy and Battery Implant;  Surgeon: Reece Agar, MD;  Location: [place];  Service: Neurosurgery;  Laterality: N/A;   TUMOR REMOVAL     small intestine x3   VISCERAL ANGIOGRAPHY N/A 08/02/2019   Procedure: VISCERAL ANGIOGRAPHY;  Surgeon: Waynetta Sandy, MD;  Location: [place] [facility];  Service: Cardiovascular;  Laterality: N/A;    Prior to Admission medications   Medication Sig Start Date End Date Taking? Authorizing Provider  acetaminophen (TYLENOL) 325 MG tablet Take 650 mg by mouth every 6 (six) hours as needed for mild pain or moderate pain.   Yes [provider]  albuterol (VENTOLIN HFA) 108 (90 Base) MCG/ACT inhaler Inhale 2 puffs into the lungs every 6 (six) hours as needed for wheezing or shortness of breath.    Yes [provider]  amLODipine (NORVASC) 5 MG tablet Take 5  mg by mouth daily.   Yes [provider]  aspirin EC 81 MG EC tablet Take 1 tablet (81 mg total) by mouth daily. 07/25/19  Yes Isaac Bliss, Rayford Halsted, MD  atorvastatin (LIPITOR) 40 MG tablet Take 1 tablet by mouth once daily Patient taking differently: Take 40 mg by mouth every evening. 03/11/22  Yes BranchAlphonse Guild, MD  beclomethasone (QVAR) 80 MCG/ACT inhaler Inhale 2 puffs into the lungs daily as needed (wheezing and shortness of breath).   Yes [provider]  bumetanide (BUMEX) 2 MG tablet Take 2 mg by mouth 2 (two) times daily.   Yes [provider]  calcitRIOL (ROCALTROL) 0.25 MCG capsule Take 0.25 mcg by mouth every Monday, Wednesday, and Friday.   Yes [provider]  CALCIUM CITRATE PO Take 600 mg by mouth in the morning and at bedtime.   Yes [provider]  carvedilol (COREG) 25 MG tablet Take 1 tablet (25 mg total) by mouth 2 (two) times daily with a meal. 09/13/21  Yes Branch, Alphonse Guild, MD  Cholecalciferol (VITAMIN D3) 2000 units TABS Take 2,000 Units by mouth daily.    Yes [provider]  clobetasol (TEMOVATE) 0.05 % external solution Apply 1 application topically 2 (two) times daily as needed (itching).   Yes [provider]  denosumab (PROLIA) 60 MG/ML SOSY injection Inject 60 mg into the skin every 6 (six) months.   Yes [provider]  gentamicin cream (GARAMYCIN) 0.1 % Apply 1 Application topically daily. 04/15/22  Yes Idamae Schuller, MD  linaclotide [facility]) 290 MCG CAPS capsule Take 145-290 mcg by mouth daily as needed (Constipation).   Yes [provider]  magnesium oxide (MAG-OX) 400 (240 Mg) MG tablet Take 400 mg by mouth daily.   Yes [provider]  Melatonin 10 MG TBDP Take 10 mg by mouth daily.   Yes [provider]  methylPREDNISolone (MEDROL DOSEPAK) 4 MG TBPK tablet Take 4 mg by mouth as directed. Per package directions 07/01/22  Yes [provider]   multivitamin (RENA-VIT) TABS tablet Take 1 tablet by mouth daily.   Yes [provider]  nitroGLYCERIN (NITROSTAT) 0.4 MG SL tablet Place 1 tablet (0.4 mg total) under the tongue every 5 (five) minutes as needed for chest pain. 10/22/21  Yes Branch, Alphonse Guild, MD  pantoprazole (PROTONIX) 40 MG tablet Take 1 tablet (40 mg total) by mouth daily. 10/11/21 10/11/22 Yes Pahwani, Einar Grad,  MD  PLAVIX 75 MG tablet Take 75 mg by mouth daily.   Yes [provider]  polyethylene glycol (MIRALAX / GLYCOLAX) 17 g packet Take 17 g by mouth daily as needed for mild constipation or moderate constipation.   Yes [provider]  potassium chloride SA (KLOR-CON M) 20 MEQ tablet Take 20 mEq by mouth every other day. 01/23/22  Yes [provider]  risperiDONE (RISPERDAL) 0.5 MG tablet Take 0.5 mg by mouth at bedtime.   Yes [provider]  rOPINIRole (REQUIP) 1 MG tablet Take 1 mg by mouth at bedtime. 03/02/21  Yes [provider]  sertraline (ZOLOFT) 25 MG tablet Take 25 mg by mouth in the morning. 09/22/20  Yes [provider]  isosorbide mononitrate (IMDUR) 60 MG 24 hr tablet Take 1 tablet (60 mg total) by mouth daily. 07/12/22   Charlynne Cousins, MD    Scheduled Meds:  aspirin EC  81 mg Oral Daily   atorvastatin  40 mg Oral Daily   budesonide  2 mL Inhalation BID   calcium citrate  200 mg of elemental calcium Oral Daily   carvedilol  12.5 mg Oral BID WC   Chlorhexidine Gluconate Cloth  6 each Topical Daily   Chlorhexidine Gluconate Cloth  6 each Topical Q0600   clopidogrel  75 mg Oral Daily   gentamicin cream  1 Application Topical Daily   heparin injection (subcutaneous)  5,000 Units Subcutaneous Q8H   insulin aspart  0-15 Units Subcutaneous TID WC   insulin aspart  0-5 Units Subcutaneous QHS   isosorbide mononitrate  60 mg Oral Daily   multivitamin  1 tablet Oral Daily   pantoprazole  40 mg Oral Daily   sertraline  25 mg Oral Daily   sodium  chloride flush  3 mL Intravenous Q12H   Continuous Infusions:  sodium chloride     nitroGLYCERIN Stopped (07/10/22 0810)   PRN Meds:.sodium chloride, acetaminophen, acetaminophen, albuterol, alum & mag hydroxide-simeth, HYDROmorphone (DILAUDID) injection, magnesium hydroxide, nitroGLYCERIN, ondansetron (ZOFRAN) IV, ondansetron (ZOFRAN) IV, mouth rinse, oxyCODONE, sodium chloride flush  Allergies as of 07/07/2022 - Review Complete 07/07/2022  Allergen Reaction Noted   Hydrocodone Itching 03/05/2021   Hydrocodone-acetaminophen Itching 04/10/2022   Other  10/31/2020   Oxycodone-acetaminophen Other (See Comments) 03/05/2021    Family History  Problem Relation Age of Onset   Diabetes Mother    Alcohol abuse Father     Social History   Socioeconomic History   Marital status: Married    Spouse name: Not on file   Number of children: 2   Years of education: Not on file   Highest education level: Not on file  Occupational History   Occupation: retired Engineer, structural  Tobacco Use   Smoking status: Former    Years: 40.00    Types: Cigarettes    Start date: 05/20/1968    Quit date: 11/30/2019    Years since quitting: 2.6   Smokeless tobacco: Never  Vaping Use   Vaping Use: Never used  Substance and Sexual Activity   Alcohol use: Not Currently    Alcohol/week: 0.0 standard drinks of alcohol    Comment: rare   Drug use: No   Sexual activity: Yes  Other Topics Concern   Not on file  Social History Narrative   Patient continues to smoke.    Social Determinants of Health   Financial Resource Strain: Not on file  Food Insecurity: Not on file  Transportation Needs: Not on file  Physical Activity: Not on file  Stress: Not on file  Social Connections: Not on file  Intimate Partner Violence: Not on file    Review of Systems: All negative except as stated above in HPI.  Physical Exam: Vital signs: Vitals:   07/20/22 1131 07/20/22 1613  BP: (!) 104/40 (!) 102/55  Pulse: 70  70  Resp: 18 18  Temp: 98.1 F (36.7 C) 98 F (36.7 C)  SpO2: 96% 96%   Last BM Date : 07/19/22 General:  Lethargic, elderly, Well-developed, well-nourished, pleasant and cooperative in NAD Head: normocephalic, atraumatic Eyes: anicteric sclera ENT: oropharynx clear Neck: supple, nontender Lungs:  Clear throughout to auscultation.   No wheezes, crackles, or rhonchi. No acute distress. Heart:  Regular rate and rhythm; no murmurs, clicks, rubs,  or gallops. Abdomen: diffuse tenderness without guarding, soft, nondistended, +BS, obese, peritoneal catheter noted Rectal:  Deferred Ext: no edema  GI:  Lab Results: Recent Labs    07/18/22 0817 07/19/22 0904 07/20/22 0127  WBC 7.1 7.3 7.2  HGB 10.1* 10.5* 10.0*  HCT 29.3* 30.9* 30.8*  PLT 152 151 156   BMET Recent Labs    07/18/22 0817 07/18/22 1626 07/19/22 0904 07/20/22 0127  NA 130*  --  131* 132*  K 2.9* 3.5 3.3* 3.8  CL 90*  --  94* 93*  CO2 23  --  27 31  GLUCOSE 96  --  124* 106*  BUN 74*  --  45* 31*  CREATININE 7.57*  --  5.73* 4.42*  CALCIUM 8.2*  --  7.8* 7.8*   LFT Recent Labs    07/20/22 0127  ALBUMIN 2.4*   PT/INR No results for input(s): "LABPROT", "INR" in the last 72 hours.   Studies/Results:  CT Angio Abd/Pel w/ and/or w/o  Result Date: 07/20/2022 CLINICAL DATA:  Epigastric discomfort. Assess for mesenteric ischemia. EXAM: CTA ABDOMEN AND PELVIS WITHOUT AND WITH CONTRAST TECHNIQUE: Multidetector CT imaging of the abdomen and pelvis was performed using the standard protocol during bolus administration of intravenous contrast. Multiplanar reconstructed images and MIPs were obtained and reviewed to evaluate the vascular anatomy. RADIATION DOSE REDUCTION: This exam was performed according to the departmental dose-optimization program which includes automated exposure control, adjustment of the mA and/or kV according to patient size and/or use of iterative reconstruction technique. CONTRAST:  158m  OMNIPAQUE IOHEXOL 350 MG/ML SOLN COMPARISON:  Prior CT scan of the abdomen and pelvis 04/09/2022 FINDINGS: VASCULAR Aorta: Mild fusiform aneurysmal dilation of the infrarenal abdominal aorta with a maximal transverse diameter of 3.7 cm (axial and reformatted images are concordant). Scattered atherosclerotic plaque. No evidence of dissection. Celiac: Calcified atherosclerotic plaque results in mild stenosis of the origin of the celiac artery. SMA: Calcified atherosclerotic plaque results in mild stenosis of the origin of the SMA. The vessel remains patent. Renals: Heavily calcified atherosclerotic plaque results in significant moderate to advanced stenosis of both renal arteries. IMA: Patent without evidence of aneurysm, dissection, vasculitis or significant stenosis. Inflow: Tortuous heavily calcified iliac vessels. 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 Probable mild to moderate regions of stenosis in the external iliac arteries bilaterally but no evidence of occlusion. Proximal Outflow: Heavily calcified atherosclerotic plaque in both common femoral arteries likely resulting in significant stenosis. Veins: No focal venous abnormality. Review of the MIP images confirms the above findings. NON-VASCULAR Lower chest: Partially imaged cardiac rhythm maintenance device in the right heart. The heart is normal in size. No pericardial effusion. Unremarkable distal  thoracic esophagus. No acute abnormality in the lung bases. Hepatobiliary: No focal liver abnormality is seen. Status post cholecystectomy. No biliary dilatation. Pancreas: Unremarkable. No pancreatic ductal dilatation or surrounding inflammatory changes. Spleen: Normal in size without focal abnormality. Adrenals/Urinary Tract: Normal adrenal glands. Chronic atrophy of the kidneys bilaterally. Intermediate attenuation lesion exophytic from the posterior upper pole of the right kidney appears to be slightly enlarging comparing across  prior studies dating back to March of 2021. Today, the lesion measures 2.3 x 1.6 cm compared to approximately 1.4 by 1.3 cm previously. No hydronephrosis or nephrolithiasis. No other renal lesion identified. The ureters and bladder are grossly unremarkable. Stomach/Bowel: No focal bowel wall thickening or evidence of obstruction. Lymphatic: No suspicious lymphadenopathy. Reproductive: Brachytherapy seeds are present in the prostate gland. Other: Tenckhoff peritoneal dialysis catheter present coiled in the right lower quadrant. Trace ascites as expected. No abdominal wall hernia. Musculoskeletal: Prior L2 compression fracture status post cement augmentation. Evidence of prior surgery at L4-L5 and L5-S1 with discectomy and interbody prostheses. No acute fracture or malalignment. IMPRESSION: VASCULAR 1. 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 Recommend follow-up every 2 years. This recommendation follows ACR consensus guidelines: White Paper of the ACR Incidental Findings Committee II on Vascular Findings. J Am Coll Radiol 2013; 10:789-794. Aortic aneurysm NOS (ICD10-I71.9). 2. 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 3. Additional scattered atherosclerotic vascular calcifications. Aortic Atherosclerosis (ICD10-I70.0). 4. 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 NON-VASCULAR 1. Slight interval enlargement of intermediate density lesion exophytic from the upper pole of the right kidney now measuring 2.3 x 1.6 cm compared to 1.4 x 1.3 cm in March of 2021. 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 Consider referral to urology for further evaluation. 2. No acute abnormality within the abdomen or pelvis. 3. Tenckhoff peritoneal dialysis catheter in place. 4. Additional ancillary findings as above without interval change. Signed, Criselda Peaches, MD, [place] [facility] Radiology Electronically Signed   By: Jacqulynn Cadet M.D.   On: 07/20/2022 07:07    Impression/Plan: Acute on chronic abdominal pain that is not postprandial in the setting of ESRD on peritoneal dialysis. His history is not consistent with an upper GI tract source and I think he is having functional abdominal pain that may also be related to his peritoneal dialysis. Moving his bowels. Enlarging right kidney lesion seen on CT angio and may be contributing to his abdominal pain. Defer workup of that to primary team. I do not think he needs a repeat EGD to evaluate this abdominal pain at this time. I am not sure what is causing his abdominal pain but doubt an obstructive GI source and doubt an ulcer. Continue supportive care. Will sign off. Call us back if questions.    LOS: 12 days   Lear Ng  07/20/2022, 5:44 PM  Questions please call [phone]

## 2022-07-20 NOTE — Progress Notes (Signed)
I reviewed his CTA abdomen pelvis.  The celiac and SMA are calcified but I do not see any significant flow-limiting stenosis to explain his abdominal pain or to warrant intervention.  Would evaluate other etiology for his abdominal pain.  Marty Heck, MD [facility] [place] Office: [place]

## 2022-07-20 NOTE — Progress Notes (Signed)
PROGRESS NOTE    [name]  [id number] DOB: 1951-04-25 DOA: 07/07/2022 PCP: Tobe Sos, MD   Brief Narrative:    [name] is an 72 y.o. 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  On aspirin, Plavix, beta-blocker, and statins prior to admission.  Workup revealed NSTEMI.  Was seen by cardiology, post cardiac cath with PCI.  Per cardiology: "Patient has occluded sub branch of the first diagonal. The LCx is occluded. There is moderate disease in the proximal and distal RCA but FFR was OK. There is obstructive disease in the diagonal but this is fairly small in caliber and severely calcified. In short there are no good targets for PCI. We could potentially treat the diagonal but long term patency of this would be poor especially with heavy calcification and ESRD. Will push medical therapy.  He now continues to have some intermittent abdominal pain that does not appear to be related to mesenteric ischemia.  May require GI evaluation for consideration of endoscopy.  Assessment & Plan:   Principal Problem:   Chest pain Active Problems:   Mixed hyperlipidemia   COPD (chronic obstructive pulmonary disease) (HCC)   CAD (coronary artery disease)   Hypokalemia   Diabetes mellitus with nephropathy (HCC)   Hyponatremia   Chronic diastolic CHF (congestive heart failure) (HCC)   ESRD (end stage renal disease) (HCC)   Depression   Unstable angina (HCC)  Assessment and  Plan:   NSTEMI: Continue aspirin, statins, Coreg, Plavix and Imdur. Plan to discharge to SNF, [facility] in [place]. Plan to continue with hemodialysis Monday Wednesday Friday at [facility] in [place]. Possible discharge to RE 3/4   Epigastric pain in the setting of celiac artery/superior mesenteric artery stenosis greater than 70% from mesenteric ultrasound. CTA abdomen pelvis with no significant findings No need for further evaluation and management per vascular surgery Patient continues to have some ongoing pain concerns intermittently Consultation to GI appreciated   A-fib/atrial flutter, not on oral anticoagulation: Not anticoagulation due to history of anemia and nonbleeding AVMs in June 2022 seen on colonoscopy. Hemoglobin has remained stable while on DAPT therapy. Continue to monitor on telemetry   HFpEF Continue home regimen.   Resolved, symptomatic bradycardia: Status post pacemaker.   End-stage renal disease on peritoneal dialysis: Tolerated hemodialysis well.  Hemodialyze on 07/18/2022. Hemodialysis again on 07/19/2022.   Anemia of chronic renal disease: Hemoglobin has remained relatively stable continue to monitor intermittently. No overt bleeding reported   Metabolic bone disease: Continue PhosLo BID added.   Hyponatremia, improving: Increase oral protein calorie intake.   Diabetes mellitus type 2 with nephropathy: Controlled on sliding scale insulin Continue current management.   COPD: Continue home inhalers.   Hyperlipidemia:  continue statins.   Class I morbid obesity: Follow-up with PCP as an outpatient will need further evaluation for sleep apnea as an outpatient.   Resolved metabolic encephalopathy:   Goals of care: Made DNR  Seen by palliative care team    DVT prophylaxis:Heparin Code Status: DNR Family Communication: Wife  on phone 3/2 Disposition Plan:  Status is: Inpatient Remains inpatient appropriate because: Need  for IV medications.   Consultants:  Vascular surgery Nephrology GI  Procedures:  None  Antimicrobials:  Anti-infectives (From admission, onward)    None      Subjective: Patient seen and evaluated today with no new acute complaints or concerns. No acute concerns or events noted overnight.  He continues to have some sharp abdominal pain that is intermittent in nature.  Objective: Vitals:   07/19/22 2030 07/19/22 2036 07/20/22 0051 07/20/22 0510  BP: (!) 98/54  (!) 132/95 126/70  Pulse: 70  70 70  Resp: 18  (!) 21 14  Temp: 98 F (36.7 C)  98 F (36.7 C) 98.1 F (36.7 C)  TempSrc: Oral  Oral Oral  SpO2: 100% 95% 93% 96%  Weight:    116.8 kg  Height:        Intake/Output Summary (Last 24 hours) at 07/20/2022 0657 Last data filed at 07/19/2022 1801 Gross per 24 hour  Intake 480 ml  Output 400 ml  Net 80 ml   Filed Weights   07/18/22 0755 07/18/22 1138 07/20/22 0510  Weight: 120 kg 118.4 kg 116.8 kg    Examination:  General exam: Appears calm and comfortable  Respiratory system: Clear to auscultation. Respiratory effort normal. Cardiovascular system: S1 & S2 heard, RRR.  Gastrointestinal system: Abdomen is soft Central nervous system: Alert and awake Extremities: No edema Skin: No significant lesions noted Psychiatry: Flat affect.    Data Reviewed: I have personally reviewed following labs and imaging studies  CBC: Recent Labs  Lab 07/14/22 0052 07/15/22 0047 07/18/22 0817 07/19/22 0904 07/20/22 0127  WBC 11.8* 9.3 7.1 7.3 7.2  HGB 10.9* 10.8* 10.1* 10.5* 10.0*  HCT 33.0* 32.8* 29.3* 30.9* 30.8*  MCV 106.1* 106.1* 102.8* 104.7* 106.2*  PLT 158 148* 152 151 [id number]   Basic Metabolic Panel: Recent Labs  Lab 07/14/22 0052 07/17/22 1033 07/18/22 0817 07/18/22 1626 07/19/22 0904 07/20/22 0127  NA 133* 129* 130*  --  131* 132*  K 3.2* 3.1* 2.9* 3.5 3.3* 3.8  CL 95* 88* 90*  --  94* 93*  CO2 '24 26 23  '$ --  27 31  GLUCOSE 169* 162* 96  --  124*  106*  BUN 61* 69* 74*  --  45* 31*  CREATININE 6.66* 7.40* 7.57*  --  5.73* 4.42*  CALCIUM 8.7* 8.2* 8.2*  --  7.8* 7.8*  MG  --   --   --  2.4  --   --   PHOS  --   --  6.4*  --  3.9 3.1   GFR: Estimated Creatinine Clearance: 22 mL/min (A) (by C-G formula based on SCr of 4.42 mg/dL (H)). Liver Function Tests: Recent Labs  Lab 07/18/22 0817 07/19/22 0904 07/20/22 0127  ALBUMIN 2.2* 2.3* 2.4*   No results for input(s): "LIPASE", "AMYLASE" in the last 168 hours. No results for input(s): "AMMONIA" in the last 168 hours. Coagulation Profile: No results for input(s): "INR", "PROTIME" in the last 168 hours. Cardiac Enzymes: No results for input(s): "CKTOTAL", "CKMB", "CKMBINDEX", "TROPONINI" in the last 168 hours. BNP (last 3 results) No results for input(s): "PROBNP" in the last 8760 hours. HbA1C: No results for input(s): "HGBA1C" in the last 72 hours. CBG: Recent Labs  Lab 07/18/22 2119 07/19/22 0601 07/19/22 1625 07/19/22 2117 07/20/22 0622  GLUCAP 126* 86 145* 125* 108*   Lipid Profile: No results for input(s): "CHOL", "HDL", "LDLCALC", "  TRIG", "CHOLHDL", "LDLDIRECT" in the last 72 hours. Thyroid Function Tests: No results for input(s): "TSH", "T4TOTAL", "FREET4", "T3FREE", "THYROIDAB" in the last 72 hours. Anemia Panel: No results for input(s): "VITAMINB12", "FOLATE", "FERRITIN", "TIBC", "IRON", "RETICCTPCT" in the last 72 hours. Sepsis Labs: No results for input(s): "PROCALCITON", "LATICACIDVEN" in the last 168 hours.  No results found for this or any previous visit (from the past 240 hour(s)).       Radiology Studies: No results found.      Scheduled Meds:  aspirin EC  81 mg Oral Daily   atorvastatin  40 mg Oral Daily   budesonide  2 mL Inhalation BID   calcium citrate  200 mg of elemental calcium Oral Daily   carvedilol  12.5 mg Oral BID WC   Chlorhexidine Gluconate Cloth  6 each Topical Daily   Chlorhexidine Gluconate Cloth  6 each Topical Q0600    clopidogrel  75 mg Oral Daily   gentamicin cream  1 Application Topical Daily   heparin injection (subcutaneous)  5,000 Units Subcutaneous Q8H   insulin aspart  0-15 Units Subcutaneous TID WC   insulin aspart  0-5 Units Subcutaneous QHS   isosorbide mononitrate  60 mg Oral Daily   multivitamin  1 tablet Oral Daily   pantoprazole  40 mg Oral Daily   sertraline  25 mg Oral Daily   sodium chloride flush  3 mL Intravenous Q12H   Continuous Infusions:  sodium chloride     nitroGLYCERIN Stopped (07/10/22 0810)     LOS: 12 days    Time spent: 35 minutes    Raylyn Speckman Darleen Crocker, DO [facility]  If 7PM-7AM, please contact night-coverage [email or web address] 07/20/2022, 6:57 AM

## 2022-07-20 NOTE — Progress Notes (Signed)
[facility] Progress Note    Assessment/ Plan:   Chest pain - hx of CAD. LHC done, pt has lots of diffuse disease w/ no good targets for stenting. Medical Rx per cardiology recs. Avoid morphine. Can use fentanyl or dilaudid if needed ESRD - on PD, lives in [place], [place]. Last PD 2/17-2/28 overnight. Plan is to transition to HD via his LUE AVF given dispo. HD#1 2/29. HD on MWF given that he has been accepted to [facility] MWF. Outpatient PD flush also arranged HTN/ volume - initial CXR mild vasc congestion. LVEDP w/ cath was 18, euvolemic per cards note.  Volume status acceptable on exam. 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 Cont home po vdra. HTN - BP's were low and wt's are down, norvasc d/c'ed and coreg dose lowered. Bps are now acceptable AMS - per primary Dispo - Discussed with SW later on 2/27, plan is for SNF, transitioning to HD 2/29. Accepted to [facility] MWF, can start 3/1 but still here. Cannot do PD at SNF. Anticipate D/C on 3/4?  OP PD: f/b [facility], gets PD through [facility] in [place]  Per the wife --> 4 exchanges overnight, 3000 fill, 1.5 hr dwell, use greens/ yellows mostly   Dry wt is 269 lbs/ 122kg   CXR - CM, poss vasc congestion, no edema   Subjective:   Patient seen and examined bedside.  Patient currently resting comfortably.  He does report that he tolerated dialysis yesterday with a net UF of 1.5 L.  Had some epigastric pain, seen by [facility] given concerns of superior mesenteric artery stenosis.  CTA without any significant flow-limiting stenosis.   Objective:   BP (!) 106/50 (BP Location: Right Arm)   Pulse 77   Temp 98.2 F (36.8 C) (Oral)   Resp 18   Ht '6\' 4"'$  (1.93 m) Comment: in  Wt 116.8 kg   SpO2 95%   BMI 31.34 kg/m   Intake/Output Summary (Last 24 hours) at 07/20/2022 0825 Last data filed at 07/19/2022 1801 Gross per 24 hour  Intake 420 ml  Output 400 ml  Net 20 ml    Weight change: -3.2 kg  Physical Exam: Gen: NAD CVS: RRR Resp: CTA b/l Abd: soft, nt/nd Ext: no edema Neuro: awake, alert Dialysis access: LUE AVF (in use), PD cath c/d/i  Imaging: CT Angio Abd/Pel w/ and/or w/o  Result Date: 07/20/2022 CLINICAL DATA:  Epigastric discomfort. Assess for mesenteric ischemia. EXAM: CTA ABDOMEN AND PELVIS WITHOUT AND WITH CONTRAST TECHNIQUE: Multidetector CT imaging of the abdomen and pelvis was performed using the standard protocol during bolus administration of intravenous contrast. Multiplanar reconstructed images and MIPs were obtained and reviewed to evaluate the vascular anatomy. RADIATION DOSE REDUCTION: This exam was performed according to the departmental dose-optimization program which includes automated exposure control, adjustment of the mA and/or kV according to patient size and/or use of iterative reconstruction technique. CONTRAST:  167m OMNIPAQUE IOHEXOL 350 MG/ML SOLN COMPARISON:  Prior CT scan of the abdomen and pelvis 04/09/2022 FINDINGS: VASCULAR Aorta: Mild fusiform aneurysmal dilation of the infrarenal abdominal aorta with a maximal transverse diameter of 3.7 cm (axial and reformatted images are concordant). Scattered atherosclerotic plaque. No evidence of dissection. Celiac: Calcified atherosclerotic plaque results in mild stenosis of the origin of the celiac artery. SMA: Calcified atherosclerotic plaque results in mild stenosis of the origin of the SMA. The vessel remains patent. Renals: Heavily calcified  atherosclerotic plaque results in significant moderate to advanced stenosis of both renal arteries. IMA: Patent without evidence of aneurysm, dissection, vasculitis or significant stenosis. Inflow: Tortuous heavily calcified iliac vessels. 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 Probable mild to moderate regions of stenosis in the external iliac arteries bilaterally but no evidence of occlusion. Proximal Outflow:  Heavily calcified atherosclerotic plaque in both common femoral arteries likely resulting in significant stenosis. Veins: No focal venous abnormality. Review of the MIP images confirms the above findings. NON-VASCULAR Lower chest: Partially imaged cardiac rhythm maintenance device in the right heart. The heart is normal in size. No pericardial effusion. Unremarkable distal thoracic esophagus. No acute abnormality in the lung bases. Hepatobiliary: No focal liver abnormality is seen. Status post cholecystectomy. No biliary dilatation. Pancreas: Unremarkable. No pancreatic ductal dilatation or surrounding inflammatory changes. Spleen: Normal in size without focal abnormality. Adrenals/Urinary Tract: Normal adrenal glands. Chronic atrophy of the kidneys bilaterally. Intermediate attenuation lesion exophytic from the posterior upper pole of the right kidney appears to be slightly enlarging comparing across prior studies dating back to March of 2021. Today, the lesion measures 2.3 x 1.6 cm compared to approximately 1.4 by 1.3 cm previously. No hydronephrosis or nephrolithiasis. No other renal lesion identified. The ureters and bladder are grossly unremarkable. Stomach/Bowel: No focal bowel wall thickening or evidence of obstruction. Lymphatic: No suspicious lymphadenopathy. Reproductive: Brachytherapy seeds are present in the prostate gland. Other: Tenckhoff peritoneal dialysis catheter present coiled in the right lower quadrant. Trace ascites as expected. No abdominal wall hernia. Musculoskeletal: Prior L2 compression fracture status post cement augmentation. Evidence of prior surgery at L4-L5 and L5-S1 with discectomy and interbody prostheses. No acute fracture or malalignment. IMPRESSION: VASCULAR 1. 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 Recommend follow-up every 2 years. This recommendation follows ACR consensus guidelines: White Paper of the ACR Incidental Findings  Committee II on Vascular Findings. J Am Coll Radiol 2013; 10:789-794. Aortic aneurysm NOS (ICD10-I71.9). 2. 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 3. Additional scattered atherosclerotic vascular calcifications. Aortic Atherosclerosis (ICD10-I70.0). 4. 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 NON-VASCULAR 1. Slight interval enlargement of intermediate density lesion exophytic from the upper pole of the right kidney now measuring 2.3 x 1.6 cm compared to 1.4 x 1.3 cm in March of 2021. 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 Consider referral to urology for further evaluation. 2. No acute abnormality within the abdomen or pelvis. 3. Tenckhoff peritoneal dialysis catheter in place. 4. Additional ancillary findings as above without interval change. Signed, Criselda Peaches, MD, [facility] Radiology Electronically Signed   By: Jacqulynn Cadet M.D.   On: 07/20/2022 07:07    Labs: BMET Recent Labs  Lab 07/14/22 0052 07/17/22 1033 07/18/22 0817 07/18/22 1626 07/19/22 0904 07/20/22 0127  NA 133* 129* 130*  --  131* 132*  K 3.2* 3.1* 2.9* 3.5 3.3* 3.8  CL 95* 88* 90*  --  94* 93*  CO2 '24 26 23  '$ --  27 31  GLUCOSE 169* 162* 96  --  124* 106*  BUN 61* 69* 74*  --  45* 31*  CREATININE 6.66* 7.40* 7.57*  --  5.73* 4.42*  CALCIUM 8.7* 8.2* 8.2*  --  7.8* 7.8*  PHOS  --   --  6.4*  --  3.9 3.1  CBC Recent Labs  Lab 07/15/22 0047 07/18/22 0817 07/19/22 0904 07/20/22 0127  WBC 9.3 7.1 7.3 7.2  HGB 10.8* 10.1* 10.5* 10.0*  HCT 32.8* 29.3* 30.9* 30.8*  MCV 106.1* 102.8* 104.7* 106.2*  PLT 148* 152 151 156    Medications:     aspirin EC  81 mg Oral Daily   atorvastatin  40 mg Oral Daily   budesonide  2 mL Inhalation BID   calcium citrate  200 mg of elemental calcium Oral Daily   carvedilol  12.5 mg Oral BID WC   Chlorhexidine  Gluconate Cloth  6 each Topical Daily   Chlorhexidine Gluconate Cloth  6 each Topical Q0600   clopidogrel  75 mg Oral Daily   gentamicin cream  1 Application Topical Daily   heparin injection (subcutaneous)  5,000 Units Subcutaneous Q8H   insulin aspart  0-15 Units Subcutaneous TID WC   insulin aspart  0-5 Units Subcutaneous QHS   isosorbide mononitrate  60 mg Oral Daily   multivitamin  1 tablet Oral Daily   pantoprazole  40 mg Oral Daily   sertraline  25 mg Oral Daily   sodium chloride flush  3 mL Intravenous Q12H      Gean Quint, MD [facility] 07/20/2022, 8:25 AM

## 2022-07-21 DIAGNOSIS — R079 Chest pain, unspecified: Secondary | ICD-10-CM | POA: Diagnosis not present

## 2022-07-21 LAB — GLUCOSE, CAPILLARY
Glucose-Capillary: 76 mg/dL (ref 70–99)
Glucose-Capillary: 106 mg/dL — ABNORMAL HIGH (ref 70–99)
Glucose-Capillary: 110 mg/dL — ABNORMAL HIGH (ref 70–99)
Glucose-Capillary: 127 mg/dL — ABNORMAL HIGH (ref 70–99)

## 2022-07-21 NOTE — Progress Notes (Signed)
PROGRESS NOTE    HATCHER FRITH  [id number] DOB: 1950-07-21 DOA: 07/07/2022 PCP: Tobe Sos, MD   Brief Narrative:    [name] is an 72 y.o. 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  On aspirin, Plavix, beta-blocker, and statins prior to admission.  Workup revealed NSTEMI.  Was seen by cardiology, post cardiac cath with PCI.  Per cardiology: "Patient has occluded sub branch of the first diagonal. The LCx is occluded. There is moderate disease in the proximal and distal RCA but FFR was OK. There is obstructive disease in the diagonal but this is fairly small in caliber and severely calcified. In short there are no good targets for PCI. We could potentially treat the diagonal but long term patency of this would be poor especially with heavy calcification and ESRD. Will push medical therapy.  He now continues to have some intermittent abdominal pain that does not appear to be related to mesenteric ischemia.  GI evaluated abdominal pain and this appears to be more functional abdominal pain that may be related to findings of kidney enlargement on CT.  Assessment & Plan:   Principal Problem:   Chest pain Active Problems:   Mixed hyperlipidemia   COPD (chronic obstructive pulmonary disease) (HCC)   CAD (coronary artery disease)   Hypokalemia   Diabetes mellitus with nephropathy (HCC)   Hyponatremia   Chronic diastolic CHF (congestive heart failure) (HCC)   ESRD (end stage renal  disease) (HCC)   Depression   Unstable angina (HCC)  Assessment and Plan:   NSTEMI: Continue aspirin, statins, Coreg, Plavix and Imdur. Plan to discharge to SNF, [facility] in [place]. Plan to continue with hemodialysis Monday Wednesday Friday at [facility] in [place]. Possible discharge to RE 3/4, TOC to reach out to facility on this date   Epigastric pain in the setting of celiac artery/superior mesenteric artery stenosis greater than 70% from mesenteric ultrasound. CTA abdomen pelvis with no significant findings No need for further evaluation and management per vascular surgery Patient continues to have some ongoing pain concerns intermittently Consultation to GI appreciated, with unknown source of pain  Enlarging right kidney lesion seen on CT angiogram Could possibly be related to his abdominal pain, but unlikely Plan to follow-up with urology outpatient due to concern for slow-growing neoplasm such as papillary renal cell carcinoma   A-fib/atrial flutter, not on oral anticoagulation: Not anticoagulation due to history of anemia and nonbleeding AVMs in June 2022 seen on colonoscopy. Hemoglobin has remained stable while on DAPT therapy. Continue to monitor on telemetry   HFpEF Continue home regimen.   Resolved, symptomatic bradycardia: Status post pacemaker.   End-stage renal disease on peritoneal dialysis: Tolerated hemodialysis well.  Hemodialyze on 07/18/2022. Hemodialysis again on 07/19/2022.   Anemia of chronic renal disease: Hemoglobin has remained relatively stable continue to monitor intermittently. No overt bleeding reported   Metabolic bone disease: Continue PhosLo BID added.   Hyponatremia, improving: Increase oral protein calorie intake.   Diabetes mellitus type 2 with nephropathy: Controlled on sliding scale  insulin Continue current management.   COPD: Continue home inhalers.   Hyperlipidemia:  continue statins.   Class I morbid  obesity: Follow-up with PCP as an outpatient will need further evaluation for sleep apnea as an outpatient.   Resolved metabolic encephalopathy:   Goals of care: Made DNR  Seen by palliative care team    DVT prophylaxis:Heparin Code Status: DNR Family Communication: Wife on phone 3/2 Disposition Plan:  Status is: Inpatient Remains inpatient appropriate because: Need for IV medications.   Consultants:  Vascular surgery Nephrology GI  Procedures:  None  Antimicrobials:  Anti-infectives (From admission, onward)    None      Subjective: Patient seen and evaluated today with no new acute complaints or concerns. No acute concerns or events noted overnight.  He states that his abdominal pain has improved.  Objective: Vitals:   07/21/22 0036 07/21/22 0442 07/21/22 0738 07/21/22 0930  BP: 114/63 137/75 92/69   Pulse: 68 70 70   Resp: '18 17 17   '$ Temp: 98 F (36.7 C) 98.1 F (36.7 C) 97.7 F (36.5 C)   TempSrc: Oral Oral Oral   SpO2: 93% 97% 96% 97%  Weight:      Height:        Intake/Output Summary (Last 24 hours) at 07/21/2022 0936 Last data filed at 07/21/2022 0615 Gross per 24 hour  Intake 360 ml  Output 100 ml  Net 260 ml   Filed Weights   07/18/22 0755 07/18/22 1138 07/20/22 0510  Weight: 120 kg 118.4 kg 116.8 kg    Examination:  General exam: Appears calm and comfortable  Respiratory system: Clear to auscultation. Respiratory effort normal. Cardiovascular system: S1 & S2 heard, RRR.  Gastrointestinal system: Abdomen is soft Central nervous system: Alert and awake Extremities: No edema Skin: No significant lesions noted Psychiatry: Flat affect.    Data Reviewed: I have personally reviewed following labs and imaging studies  CBC: Recent Labs  Lab 07/15/22 0047 07/18/22 0817 07/19/22 0904 07/20/22 0127  WBC 9.3 7.1 7.3 7.2  HGB 10.8* 10.1* 10.5* 10.0*  HCT 32.8* 29.3* 30.9* 30.8*  MCV 106.1* 102.8* 104.7* 106.2*  PLT 148* 152 151 [id number]    Basic Metabolic Panel: Recent Labs  Lab 07/17/22 1033 07/18/22 0817 07/18/22 1626 07/19/22 0904 07/20/22 0127  NA 129* 130*  --  131* 132*  K 3.1* 2.9* 3.5 3.3* 3.8  CL 88* 90*  --  94* 93*  CO2 26 23  --  27 31  GLUCOSE 162* 96  --  124* 106*  BUN 69* 74*  --  45* 31*  CREATININE 7.40* 7.57*  --  5.73* 4.42*  CALCIUM 8.2* 8.2*  --  7.8* 7.8*  MG  --   --  2.4  --   --   PHOS  --  6.4*  --  3.9 3.1   GFR: Estimated Creatinine Clearance: 22 mL/min (A) (by C-G formula based on SCr of 4.42 mg/dL (H)). Liver Function Tests: Recent Labs  Lab 07/18/22 0817 07/19/22 0904 07/20/22 0127  ALBUMIN 2.2* 2.3* 2.4*   No results for input(s): "LIPASE", "AMYLASE" in the last 168 hours. No results for input(s): "AMMONIA" in the last 168 hours. Coagulation Profile: No results for input(s): "INR", "PROTIME" in the last 168 hours. Cardiac Enzymes: No results for input(s): "CKTOTAL", "CKMB", "CKMBINDEX", "TROPONINI" in the last 168 hours. BNP (last 3 results) No results for input(s): "PROBNP" in the last 8760 hours. HbA1C: No results for input(s): "HGBA1C" in the last  72 hours. CBG: Recent Labs  Lab 07/20/22 0622 07/20/22 1130 07/20/22 1611 07/20/22 2151 07/21/22 0608  GLUCAP 108* 108* 117* 108* 76   Lipid Profile: No results for input(s): "CHOL", "HDL", "LDLCALC", "TRIG", "CHOLHDL", "LDLDIRECT" in the last 72 hours. Thyroid Function Tests: No results for input(s): "TSH", "T4TOTAL", "FREET4", "T3FREE", "THYROIDAB" in the last 72 hours. Anemia Panel: No results for input(s): "VITAMINB12", "FOLATE", "FERRITIN", "TIBC", "IRON", "RETICCTPCT" in the last 72 hours. Sepsis Labs: No results for input(s): "PROCALCITON", "LATICACIDVEN" in the last 168 hours.  No results found for this or any previous visit (from the past 240 hour(s)).       Radiology Studies: ECHOCARDIOGRAM COMPLETE  Result Date: 07/20/2022    ECHOCARDIOGRAM REPORT   Patient Name:   ANASTASIA DEHOOG Date of  Exam: 07/20/2022 Medical Rec #:  [id number]       Height:       76.0 in Accession #:    [id number]      Weight:       257.5 lb Date of Birth:  04/08/1951       BSA:          2.466 m Patient Age:    67 years        BP:           106/50 mmHg Patient Gender: M               HR:           70 bpm. Exam Location:  Inpatient Procedure: 2D Echo, Cardiac Doppler and Color Doppler Indications:    Chest Pain R07.9  History:        Patient has prior history of Echocardiogram examinations, most                 recent 10/11/2021. CAD, COPD and BUERGER'S DISEASE; Risk                 Factors:Diabetes, TOBACCO ABUSE and Dyslipidemia.  Sonographer:    Luane School RDCS Referring Phys: [id number] [facility]  Sonographer Comments: No subcostal window. IMPRESSIONS  1. Left ventricular ejection fraction, by estimation, is 60 to 65%. The left ventricle has normal function. The left ventricle has no regional wall motion abnormalities. There is moderate left ventricular hypertrophy. Left ventricular diastolic parameters are indeterminate.  2. Right ventricular systolic function is mildly reduced. The right ventricular size is normal.  3. The mitral valve is degenerative. Trivial mitral valve regurgitation. No evidence of mitral stenosis. Moderate mitral annular calcification.  4. The aortic valve was not well visualized. Aortic valve regurgitation is not visualized. Aortic valve sclerosis/calcification is present, without any evidence of aortic stenosis. FINDINGS  Left Ventricle: Left ventricular ejection fraction, by estimation, is 60 to 65%. The left ventricle has normal function. The left ventricle has no regional wall motion abnormalities. The left ventricular internal cavity size was normal in size. There is  moderate left ventricular hypertrophy. Left ventricular diastolic parameters are indeterminate. Right Ventricle: The right ventricular size is normal. No increase in right ventricular wall thickness. Right ventricular systolic function  is mildly reduced. Left Atrium: Left atrial size was normal in size. Right Atrium: Right atrial size was normal in size. Pericardium: Trivial pericardial effusion is present. Mitral Valve: The mitral valve is degenerative in appearance. Moderate mitral annular calcification. Trivial mitral valve regurgitation. No evidence of mitral valve stenosis. Tricuspid Valve: The tricuspid valve is normal in structure. Tricuspid valve regurgitation is trivial. Aortic Valve: The aortic  valve was not well visualized. Aortic valve regurgitation is not visualized. Aortic valve sclerosis/calcification is present, without any evidence of aortic stenosis. Pulmonic Valve: The pulmonic valve was not well visualized. Pulmonic valve regurgitation is not visualized. Aorta: The aortic root and ascending aorta are structurally normal, with no evidence of dilitation. IAS/Shunts: The interatrial septum was not well visualized.  LEFT VENTRICLE PLAX 2D LVIDd:         5.40 cm   Diastology LVIDs:         3.30 cm   LV e' medial:    3.15 cm/s LV PW:         1.40 cm   LV E/e' medial:  40.3 LV IVS:        1.50 cm   LV e' lateral:   4.57 cm/s LVOT diam:     2.10 cm   LV E/e' lateral: 27.8 LV SV:         63 LV SV Index:   26 LVOT Area:     3.46 cm  RIGHT VENTRICLE RV S prime:     8.70 cm/s TAPSE (M-mode): 2.0 cm LEFT ATRIUM             Index        RIGHT ATRIUM           Index LA diam:        5.30 cm 2.15 cm/m   RA Area:     15.40 cm LA Vol (A2C):   72.2 ml 29.27 ml/m  RA Volume:   37.10 ml  15.04 ml/m LA Vol (A4C):   63.3 ml 25.67 ml/m LA Biplane Vol: 67.5 ml 27.37 ml/m  AORTIC VALVE LVOT Vmax:   88.40 cm/s LVOT Vmean:  57.500 cm/s LVOT VTI:    0.183 m  AORTA Ao Root diam: 2.90 cm Ao Asc diam:  3.50 cm MITRAL VALVE                TRICUSPID VALVE MV Area (PHT): 3.74 cm     TR Peak grad:   29.8 mmHg MV Decel Time: 203 msec     TR Vmax:        273.00 cm/s MV E velocity: 127.00 cm/s                             SHUNTS                              Systemic VTI:  0.18 m                             Systemic Diam: 2.10 cm Oswaldo Milian MD Electronically signed by Oswaldo Milian MD Signature Date/Time: 07/20/2022/1:21:17 PM    Final    CT Angio Abd/Pel w/ and/or w/o  Result Date: 07/20/2022 CLINICAL DATA:  Epigastric discomfort. Assess for mesenteric ischemia. EXAM: CTA ABDOMEN AND PELVIS WITHOUT AND WITH CONTRAST TECHNIQUE: Multidetector CT imaging of the abdomen and pelvis was performed using the standard protocol during bolus administration of intravenous contrast. Multiplanar reconstructed images and MIPs were obtained and reviewed to evaluate the vascular anatomy. RADIATION DOSE REDUCTION: This exam was performed according to the departmental dose-optimization program which includes automated exposure control, adjustment of the mA and/or kV according to patient size and/or use of iterative reconstruction technique. CONTRAST:  158m OMNIPAQUE IOHEXOL 350 MG/ML  SOLN COMPARISON:  Prior CT scan of the abdomen and pelvis 04/09/2022 FINDINGS: VASCULAR Aorta: Mild fusiform aneurysmal dilation of the infrarenal abdominal aorta with a maximal transverse diameter of 3.7 cm (axial and reformatted images are concordant). Scattered atherosclerotic plaque. No evidence of dissection. Celiac: Calcified atherosclerotic plaque results in mild stenosis of the origin of the celiac artery. SMA: Calcified atherosclerotic plaque results in mild stenosis of the origin of the SMA. The vessel remains patent. Renals: Heavily calcified atherosclerotic plaque results in significant moderate to advanced stenosis of both renal arteries. IMA: Patent without evidence of aneurysm, dissection, vasculitis or significant stenosis. Inflow: Tortuous heavily calcified iliac vessels. 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 Probable mild to moderate regions of stenosis in the external iliac arteries bilaterally but no evidence of occlusion. Proximal  Outflow: Heavily calcified atherosclerotic plaque in both common femoral arteries likely resulting in significant stenosis. Veins: No focal venous abnormality. Review of the MIP images confirms the above findings. NON-VASCULAR Lower chest: Partially imaged cardiac rhythm maintenance device in the right heart. The heart is normal in size. No pericardial effusion. Unremarkable distal thoracic esophagus. No acute abnormality in the lung bases. Hepatobiliary: No focal liver abnormality is seen. Status post cholecystectomy. No biliary dilatation. Pancreas: Unremarkable. No pancreatic ductal dilatation or surrounding inflammatory changes. Spleen: Normal in size without focal abnormality. Adrenals/Urinary Tract: Normal adrenal glands. Chronic atrophy of the kidneys bilaterally. Intermediate attenuation lesion exophytic from the posterior upper pole of the right kidney appears to be slightly enlarging comparing across prior studies dating back to March of 2021. Today, the lesion measures 2.3 x 1.6 cm compared to approximately 1.4 by 1.3 cm previously. No hydronephrosis or nephrolithiasis. No other renal lesion identified. The ureters and bladder are grossly unremarkable. Stomach/Bowel: No focal bowel wall thickening or evidence of obstruction. Lymphatic: No suspicious lymphadenopathy. Reproductive: Brachytherapy seeds are present in the prostate gland. Other: Tenckhoff peritoneal dialysis catheter present coiled in the right lower quadrant. Trace ascites as expected. No abdominal wall hernia. Musculoskeletal: Prior L2 compression fracture status post cement augmentation. Evidence of prior surgery at L4-L5 and L5-S1 with discectomy and interbody prostheses. No acute fracture or malalignment. IMPRESSION: VASCULAR 1. 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 Recommend follow-up every 2 years. This recommendation follows ACR consensus guidelines: White Paper of the ACR Incidental  Findings Committee II on Vascular Findings. J Am Coll Radiol 2013; 10:789-794. Aortic aneurysm NOS (ICD10-I71.9). 2. 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 3. Additional scattered atherosclerotic vascular calcifications. Aortic Atherosclerosis (ICD10-I70.0). 4. 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 NON-VASCULAR 1. Slight interval enlargement of intermediate density lesion exophytic from the upper pole of the right kidney now measuring 2.3 x 1.6 cm compared to 1.4 x 1.3 cm in March of 2021. 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 Consider referral to urology for further evaluation. 2. No acute abnormality within the abdomen or pelvis. 3. Tenckhoff peritoneal dialysis catheter in place. 4. Additional ancillary findings as above without interval change. Signed, Criselda Peaches, MD, [facility] Electronically Signed   By: Jacqulynn Cadet M.D.   On: 07/20/2022 07:07        Scheduled Meds:  aspirin EC  81 mg Oral Daily   atorvastatin  40 mg Oral Daily   budesonide  2 mL Inhalation BID  calcium citrate  200 mg of elemental calcium Oral Daily   carvedilol  12.5 mg Oral BID WC   Chlorhexidine Gluconate Cloth  6 each Topical Daily   Chlorhexidine Gluconate Cloth  6 each Topical Q0600   clopidogrel  75 mg Oral Daily   gentamicin cream  1 Application Topical Daily   heparin injection (subcutaneous)  5,000 Units Subcutaneous Q8H   insulin aspart  0-15 Units Subcutaneous TID WC   insulin aspart  0-5 Units Subcutaneous QHS   isosorbide mononitrate  60 mg Oral Daily   multivitamin  1 tablet Oral Daily   pantoprazole  40 mg Oral Daily   sertraline  25 mg Oral Daily   sodium chloride flush  3 mL Intravenous Q12H   Continuous Infusions:  sodium chloride     nitroGLYCERIN Stopped (07/10/22 0810)     LOS: 13 days    Time spent: 35  minutes    Ed Mandich Darleen Crocker, DO [facility]  If 7PM-7AM, please contact night-coverage [email or web address] 07/21/2022, 9:36 AM

## 2022-07-21 NOTE — Plan of Care (Signed)
  Problem: Education: Goal: Ability to describe self-care measures that may prevent or decrease complications (Diabetes Survival Skills Education) will improve Outcome: Progressing Goal: Individualized Educational Video(s) Outcome: Progressing   Problem: Coping: Goal: Ability to adjust to condition or change in health will improve Outcome: Progressing   Problem: Fluid Volume: Goal: Ability to maintain a balanced intake and output will improve Outcome: Progressing   Problem: Health Behavior/Discharge Planning: Goal: Ability to identify and utilize available resources and services will improve Outcome: Progressing Goal: Ability to manage health-related needs will improve Outcome: Progressing   Problem: Metabolic: Goal: Ability to maintain appropriate glucose levels will improve Outcome: Progressing   Problem: Nutritional: Goal: Maintenance of adequate nutrition will improve Outcome: Progressing Goal: Progress toward achieving an optimal weight will improve Outcome: Progressing   Problem: Skin Integrity: Goal: Risk for impaired skin integrity will decrease Outcome: Progressing   Problem: Tissue Perfusion: Goal: Adequacy of tissue perfusion will improve Outcome: Progressing   Problem: Education: Goal: Understanding of cardiac disease, CV risk reduction, and recovery process will improve Outcome: Progressing Goal: Individualized Educational Video(s) Outcome: Progressing   Problem: Activity: Goal: Ability to tolerate increased activity will improve Outcome: Progressing   Problem: Cardiac: Goal: Ability to achieve and maintain adequate cardiovascular perfusion will improve Outcome: Progressing   Problem: Health Behavior/Discharge Planning: Goal: Ability to safely manage health-related needs after discharge will improve Outcome: Progressing   Problem: Education: Goal: Knowledge of General Education information will improve Description: Including pain rating scale,  medication(s)/side effects and non-pharmacologic comfort measures Outcome: Progressing   Problem: Health Behavior/Discharge Planning: Goal: Ability to manage health-related needs will improve Outcome: Progressing   Problem: Clinical Measurements: Goal: Ability to maintain clinical measurements within normal limits will improve Outcome: Progressing Goal: Will remain free from infection Outcome: Progressing Goal: Diagnostic test results will improve Outcome: Progressing Goal: Respiratory complications will improve Outcome: Progressing Goal: Cardiovascular complication will be avoided Outcome: Progressing   Problem: Activity: Goal: Risk for activity intolerance will decrease Outcome: Progressing   Problem: Nutrition: Goal: Adequate nutrition will be maintained Outcome: Progressing   Problem: Coping: Goal: Level of anxiety will decrease Outcome: Progressing   Problem: Elimination: Goal: Will not experience complications related to bowel motility Outcome: Progressing Goal: Will not experience complications related to urinary retention Outcome: Progressing   Problem: Pain Managment: Goal: General experience of comfort will improve Outcome: Progressing   Problem: Safety: Goal: Ability to remain free from injury will improve Outcome: Progressing   Problem: Skin Integrity: Goal: Risk for impaired skin integrity will decrease Outcome: Progressing   Problem: Education: Goal: Understanding of CV disease, CV risk reduction, and recovery process will improve Outcome: Progressing Goal: Individualized Educational Video(s) Outcome: Progressing   Problem: Activity: Goal: Ability to return to baseline activity level will improve Outcome: Progressing   Problem: Cardiovascular: Goal: Ability to achieve and maintain adequate cardiovascular perfusion will improve Outcome: Progressing Goal: Vascular access site(s) Level 0-1 will be maintained Outcome: Progressing   Problem:  Health Behavior/Discharge Planning: Goal: Ability to safely manage health-related needs after discharge will improve Outcome: Progressing

## 2022-07-21 NOTE — Plan of Care (Signed)
  Problem: Education: Goal: Ability to describe self-care measures that may prevent or decrease complications (Diabetes Survival Skills Education) will improve Outcome: Progressing   Problem: Coping: Goal: Ability to adjust to condition or change in health will improve Outcome: Progressing   Problem: Fluid Volume: Goal: Ability to maintain a balanced intake and output will improve Outcome: Progressing   Problem: Health Behavior/Discharge Planning: Goal: Ability to identify and utilize available resources and services will improve Outcome: Progressing Goal: Ability to manage health-related needs will improve Outcome: Progressing   Problem: Metabolic: Goal: Ability to maintain appropriate glucose levels will improve Outcome: Progressing   Problem: Nutritional: Goal: Progress toward achieving an optimal weight will improve Outcome: Progressing   Problem: Skin Integrity: Goal: Risk for impaired skin integrity will decrease Outcome: Progressing   Problem: Tissue Perfusion: Goal: Adequacy of tissue perfusion will improve Outcome: Progressing   Problem: Education: Goal: Understanding of cardiac disease, CV risk reduction, and recovery process will improve Outcome: Progressing   Problem: Activity: Goal: Ability to tolerate increased activity will improve Outcome: Progressing   Problem: Cardiac: Goal: Ability to achieve and maintain adequate cardiovascular perfusion will improve Outcome: Progressing   Problem: Health Behavior/Discharge Planning: Goal: Ability to manage health-related needs will improve Outcome: Progressing   Problem: Clinical Measurements: Goal: Ability to maintain clinical measurements within normal limits will improve Outcome: Progressing Goal: Will remain free from infection Outcome: Progressing Goal: Diagnostic test results will improve Outcome: Progressing Goal: Respiratory complications will improve Outcome: Progressing Goal: Cardiovascular  complication will be avoided Outcome: Progressing   Problem: Activity: Goal: Risk for activity intolerance will decrease Outcome: Progressing   Problem: Nutrition: Goal: Adequate nutrition will be maintained Outcome: Progressing   Problem: Coping: Goal: Level of anxiety will decrease Outcome: Progressing   Problem: Elimination: Goal: Will not experience complications related to bowel motility Outcome: Progressing   Problem: Safety: Goal: Ability to remain free from injury will improve Outcome: Progressing   Problem: Skin Integrity: Goal: Risk for impaired skin integrity will decrease Outcome: Progressing   Problem: Education: Goal: Understanding of CV disease, CV risk reduction, and recovery process will improve Outcome: Progressing

## 2022-07-21 NOTE — Progress Notes (Signed)
[facility] Progress Note    Assessment/ Plan:   Chest pain - hx of CAD. LHC done, pt has lots of diffuse disease w/ no good targets for stenting. Medical Rx per cardiology recs. Avoid morphine. Can use fentanyl or dilaudid if needed ESRD - on PD, lives in [place], [place]. Last PD 2/17-2/28 overnight. Plan is to transition to HD via his LUE AVF given dispo. HD#1 2/29. HD on MWF given that he has been accepted to [facility] MWF. Outpatient PD flush also arranged. If here longer than anticipated, will need PD cath flushed this week HTN/ volume - initial CXR mild vasc congestion. LVEDP w/ cath was 18, euvolemic per cards note.  Volume status acceptable on exam. 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 Would avoid ESAs given concern for RCC on CTA abd/pelv MBD ckd - CCa in range. Cont home po vdra. Po4 wnl HTN - BP's were low and wt's are down, norvasc d/c'ed and coreg dose lowered. Bps are now acceptable AMS - per primary Rt renal mass - possible RCC on CT abd 3/1. Will need expedited urology referral as an outpatient Dispo - Discussed with SW later on 2/27, plan is for SNF, transitioning to HD 2/29. Accepted to [facility] MWF, can start 3/1 but still here. Cannot do PD at SNF. Anticipate D/C on 3/4?  OP PD: f/b [facility], gets PD through [facility] in [place]  Per the wife --> 4 exchanges overnight, 3000 fill, 1.5 hr dwell, use greens/ yellows mostly   Dry wt is 269 lbs/ 122kg   CXR - CM, poss vasc congestion, no edema   Subjective:   Patient seen and examined bedside.  Patient currently resting comfortably.  He reports that his abd pain is 'fair'. No other complaints.   Objective:   BP 92/69   Pulse 70   Temp 97.7 F (36.5 C) (Oral)   Resp 17   Ht '6\' 4"'$  (1.93 m) Comment: in  Wt 116.8 kg   SpO2 96%   BMI 31.34 kg/m   Intake/Output Summary (Last 24 hours) at 07/21/2022 [id number] Last data filed at 07/21/2022 0615 Gross per 24 hour  Intake  360 ml  Output 100 ml  Net 260 ml   Weight change:   Physical Exam: Gen: NAD, laying flat in bed CVS: RRR Resp: CTA b/l Abd: soft, nt/nd Ext: no edema Neuro: awake, alert Dialysis access: LUE AVF, PD cath c/d/i  Imaging: ECHOCARDIOGRAM COMPLETE  Result Date: 07/20/2022    ECHOCARDIOGRAM REPORT   Patient Name:   [name] Date of Exam: 07/20/2022 Medical Rec #:  [id number]       Height:       76.0 in Accession #:    [id number]      Weight:       257.5 lb Date of Birth:  March 08, 1951       BSA:          2.466 m Patient Age:    72 years        BP:           106/50 mmHg Patient Gender: M               HR:           70 bpm. Exam Location:  Inpatient Procedure: 2D Echo, Cardiac Doppler and Color Doppler Indications:    Chest Pain R07.9  History:        Patient  has prior history of Echocardiogram examinations, most                 recent 10/11/2021. CAD, COPD and BUERGER'S DISEASE; Risk                 Factors:Diabetes, TOBACCO ABUSE and Dyslipidemia.  Sonographer:    Luane School RDCS Referring Phys: [id number] [facility]  Sonographer Comments: No subcostal window. IMPRESSIONS  1. Left ventricular ejection fraction, by estimation, is 60 to 65%. The left ventricle has normal function. The left ventricle has no regional wall motion abnormalities. There is moderate left ventricular hypertrophy. Left ventricular diastolic parameters are indeterminate.  2. Right ventricular systolic function is mildly reduced. The right ventricular size is normal.  3. The mitral valve is degenerative. Trivial mitral valve regurgitation. No evidence of mitral stenosis. Moderate mitral annular calcification.  4. The aortic valve was not well visualized. Aortic valve regurgitation is not visualized. Aortic valve sclerosis/calcification is present, without any evidence of aortic stenosis. FINDINGS  Left Ventricle: Left ventricular ejection fraction, by estimation, is 60 to 65%. The left ventricle has normal function. The left  ventricle has no regional wall motion abnormalities. The left ventricular internal cavity size was normal in size. There is  moderate left ventricular hypertrophy. Left ventricular diastolic parameters are indeterminate. Right Ventricle: The right ventricular size is normal. No increase in right ventricular wall thickness. Right ventricular systolic function is mildly reduced. Left Atrium: Left atrial size was normal in size. Right Atrium: Right atrial size was normal in size. Pericardium: Trivial pericardial effusion is present. Mitral Valve: The mitral valve is degenerative in appearance. Moderate mitral annular calcification. Trivial mitral valve regurgitation. No evidence of mitral valve stenosis. Tricuspid Valve: The tricuspid valve is normal in structure. Tricuspid valve regurgitation is trivial. Aortic Valve: The aortic valve was not well visualized. Aortic valve regurgitation is not visualized. Aortic valve sclerosis/calcification is present, without any evidence of aortic stenosis. Pulmonic Valve: The pulmonic valve was not well visualized. Pulmonic valve regurgitation is not visualized. Aorta: The aortic root and ascending aorta are structurally normal, with no evidence of dilitation. IAS/Shunts: The interatrial septum was not well visualized.  LEFT VENTRICLE PLAX 2D LVIDd:         5.40 cm   Diastology LVIDs:         3.30 cm   LV e' medial:    3.15 cm/s LV PW:         1.40 cm   LV E/e' medial:  40.3 LV IVS:        1.50 cm   LV e' lateral:   4.57 cm/s LVOT diam:     2.10 cm   LV E/e' lateral: 27.8 LV SV:         63 LV SV Index:   26 LVOT Area:     3.46 cm  RIGHT VENTRICLE RV S prime:     8.70 cm/s TAPSE (M-mode): 2.0 cm LEFT ATRIUM             Index        RIGHT ATRIUM           Index LA diam:        5.30 cm 2.15 cm/m   RA Area:     15.40 cm LA Vol (A2C):   72.2 ml 29.27 ml/m  RA Volume:   37.10 ml  15.04 ml/m LA Vol (A4C):   63.3 ml 25.67 ml/m LA Biplane Vol: 67.5 ml 27.37 ml/m  AORTIC VALVE LVOT  Vmax:   88.40 cm/s LVOT Vmean:  57.500 cm/s LVOT VTI:    0.183 m  AORTA Ao Root diam: 2.90 cm Ao Asc diam:  3.50 cm MITRAL VALVE                TRICUSPID VALVE MV Area (PHT): 3.74 cm     TR Peak grad:   29.8 mmHg MV Decel Time: 203 msec     TR Vmax:        273.00 cm/s MV E velocity: 127.00 cm/s                             SHUNTS                             Systemic VTI:  0.18 m                             Systemic Diam: 2.10 cm Oswaldo Milian MD Electronically signed by Oswaldo Milian MD Signature Date/Time: 07/20/2022/1:21:17 PM    Final    CT Angio Abd/Pel w/ and/or w/o  Result Date: 07/20/2022 CLINICAL DATA:  Epigastric discomfort. Assess for mesenteric ischemia. EXAM: CTA ABDOMEN AND PELVIS WITHOUT AND WITH CONTRAST TECHNIQUE: Multidetector CT imaging of the abdomen and pelvis was performed using the standard protocol during bolus administration of intravenous contrast. Multiplanar reconstructed images and MIPs were obtained and reviewed to evaluate the vascular anatomy. RADIATION DOSE REDUCTION: This exam was performed according to the departmental dose-optimization program which includes automated exposure control, adjustment of the mA and/or kV according to patient size and/or use of iterative reconstruction technique. CONTRAST:  149m OMNIPAQUE IOHEXOL 350 MG/ML SOLN COMPARISON:  Prior CT scan of the abdomen and pelvis 04/09/2022 FINDINGS: VASCULAR Aorta: Mild fusiform aneurysmal dilation of the infrarenal abdominal aorta with a maximal transverse diameter of 3.7 cm (axial and reformatted images are concordant). Scattered atherosclerotic plaque. No evidence of dissection. Celiac: Calcified atherosclerotic plaque results in mild stenosis of the origin of the celiac artery. SMA: Calcified atherosclerotic plaque results in mild stenosis of the origin of the SMA. The vessel remains patent. Renals: Heavily calcified atherosclerotic plaque results in significant moderate to advanced stenosis of both  renal arteries. IMA: Patent without evidence of aneurysm, dissection, vasculitis or significant stenosis. Inflow: Tortuous heavily calcified iliac vessels. 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 Probable mild to moderate regions of stenosis in the external iliac arteries bilaterally but no evidence of occlusion. Proximal Outflow: Heavily calcified atherosclerotic plaque in both common femoral arteries likely resulting in significant stenosis. Veins: No focal venous abnormality. Review of the MIP images confirms the above findings. NON-VASCULAR Lower chest: Partially imaged cardiac rhythm maintenance device in the right heart. The heart is normal in size. No pericardial effusion. Unremarkable distal thoracic esophagus. No acute abnormality in the lung bases. Hepatobiliary: No focal liver abnormality is seen. Status post cholecystectomy. No biliary dilatation. Pancreas: Unremarkable. No pancreatic ductal dilatation or surrounding inflammatory changes. Spleen: Normal in size without focal abnormality. Adrenals/Urinary Tract: Normal adrenal glands. Chronic atrophy of the kidneys bilaterally. Intermediate attenuation lesion exophytic from the posterior upper pole of the right kidney appears to be slightly enlarging comparing across prior studies dating back to March of 2021. Today, the lesion measures 2.3 x 1.6 cm compared to approximately 1.4 by 1.3  cm previously. No hydronephrosis or nephrolithiasis. No other renal lesion identified. The ureters and bladder are grossly unremarkable. Stomach/Bowel: No focal bowel wall thickening or evidence of obstruction. Lymphatic: No suspicious lymphadenopathy. Reproductive: Brachytherapy seeds are present in the prostate gland. Other: Tenckhoff peritoneal dialysis catheter present coiled in the right lower quadrant. Trace ascites as expected. No abdominal wall hernia. Musculoskeletal: Prior L2 compression fracture status post cement augmentation.  Evidence of prior surgery at L4-L5 and L5-S1 with discectomy and interbody prostheses. No acute fracture or malalignment. IMPRESSION: VASCULAR 1. 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 Recommend follow-up every 2 years. This recommendation follows ACR consensus guidelines: White Paper of the ACR Incidental Findings Committee II on Vascular Findings. J Am Coll Radiol 2013; 10:789-794. Aortic aneurysm NOS (ICD10-I71.9). 2. 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 3. Additional scattered atherosclerotic vascular calcifications. Aortic Atherosclerosis (ICD10-I70.0). 4. 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 NON-VASCULAR 1. Slight interval enlargement of intermediate density lesion exophytic from the upper pole of the right kidney now measuring 2.3 x 1.6 cm compared to 1.4 x 1.3 cm in March of 2021. 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 Consider referral to urology for further evaluation. 2. No acute abnormality within the abdomen or pelvis. 3. Tenckhoff peritoneal dialysis catheter in place. 4. Additional ancillary findings as above without interval change. Signed, Criselda Peaches, MD, [name]iology Electronically Signed   By: Jacqulynn Cadet M.D.   On: 07/20/2022 07:07    Labs: BMET Recent Labs  Lab 07/17/22 1033 07/18/22 0817 07/18/22 1626 07/19/22 0904 07/20/22 0127  NA 129* 130*  --  131* 132*  K 3.1* 2.9* 3.5 3.3* 3.8  CL 88* 90*  --  94* 93*  CO2 26 23  --  27 31  GLUCOSE 162* 96  --  124* 106*  BUN 69* 74*  --  45* 31*  CREATININE 7.40* 7.57*  --  5.73* 4.42*  CALCIUM 8.2* 8.2*  --  7.8* 7.8*  PHOS  --  6.4*  --  3.9 3.1   CBC Recent Labs  Lab 07/15/22 0047 07/18/22 0817 07/19/22 0904 07/20/22 0127  WBC 9.3 7.1 7.3 7.2  HGB 10.8* 10.1* 10.5* 10.0*  HCT 32.8* 29.3*  30.9* 30.8*  MCV 106.1* 102.8* 104.7* 106.2*  PLT 148* 152 151 156    Medications:     aspirin EC  81 mg Oral Daily   atorvastatin  40 mg Oral Daily   budesonide  2 mL Inhalation BID   calcium citrate  200 mg of elemental calcium Oral Daily   carvedilol  12.5 mg Oral BID WC   Chlorhexidine Gluconate Cloth  6 each Topical Daily   Chlorhexidine Gluconate Cloth  6 each Topical Q0600   clopidogrel  75 mg Oral Daily   gentamicin cream  1 Application Topical Daily   heparin injection (subcutaneous)  5,000 Units Subcutaneous Q8H   insulin aspart  0-15 Units Subcutaneous TID WC   insulin aspart  0-5 Units Subcutaneous QHS   isosorbide mononitrate  60 mg Oral Daily   multivitamin  1 tablet Oral Daily   pantoprazole  40 mg Oral Daily   sertraline  25 mg Oral Daily   sodium chloride flush  3 mL Intravenous Q12H      Gean Quint, MD [facility] 07/21/2022, 8:32 AM

## 2022-07-22 DIAGNOSIS — R079 Chest pain, unspecified: Secondary | ICD-10-CM | POA: Diagnosis not present

## 2022-07-22 LAB — GLUCOSE, CAPILLARY
Glucose-Capillary: 76 mg/dL (ref 70–99)
Glucose-Capillary: 102 mg/dL — ABNORMAL HIGH (ref 70–99)
Glucose-Capillary: 102 mg/dL — ABNORMAL HIGH (ref 70–99)

## 2022-07-22 MED ORDER — HYDROMORPHONE HCL 1 MG/ML IJ SOLN
0.5000 mg | Freq: Four times a day (QID) | INTRAMUSCULAR | Status: AC | PRN
  Administered 2022-07-22 (×2): 0.5 mg via INTRAVENOUS
  Filled 2022-07-22 (×2): qty 0.5

## 2022-07-22 MED ORDER — HYDROMORPHONE HCL 2 MG PO TABS: 1.0000 mg | ORAL_TABLET | Freq: Four times a day (QID) | ORAL | 0 refills | Status: DC | PRN

## 2022-07-22 MED ORDER — RISPERIDONE 0.5 MG PO TABS: 0.5000 mg | ORAL_TABLET | Freq: Every day | ORAL | 0 refills | Status: DC

## 2022-07-22 MED ORDER — HYDROMORPHONE HCL 1 MG/ML IJ SOLN: 1.0000 mg | Freq: Once | INTRAMUSCULAR | Status: DC

## 2022-07-22 MED ORDER — ROPINIROLE HCL 1 MG PO TABS: 1.0000 mg | ORAL_TABLET | Freq: Every evening | ORAL | 0 refills | Status: DC

## 2022-07-22 MED ORDER — HYDROMORPHONE HCL 1 MG/ML IJ SOLN
INTRAMUSCULAR | Status: AC
  Administered 2022-07-22: 1 mg
  Filled 2022-07-22: qty 1

## 2022-07-22 MED ORDER — CARVEDILOL 12.5 MG PO TABS: 12.5000 mg | ORAL_TABLET | Freq: Two times a day (BID) | ORAL | 0 refills | Status: DC

## 2022-07-22 MED ORDER — HYDROMORPHONE HCL 1 MG/ML IJ SOLN
1.0000 mg | Freq: Once | INTRAMUSCULAR | Status: AC
  Administered 2022-07-22: 1 mg via INTRAVENOUS
  Filled 2022-07-22: qty 1

## 2022-07-22 NOTE — Progress Notes (Signed)
[facility] Progress Note    Assessment/ Plan:   Chest pain - hx of CAD. LHC done, pt has lots of diffuse disease w/ no good targets for stenting. Medical Rx per cardiology recs. Avoid morphine. Can use fentanyl or dilaudid prn ESRD - on PD, lives in [place], [place]. Last PD 2/17-2/28 overnight. Plan is to transition to HD via his LUE AVF given dispo. HD#1 2/29. HD on MWF given that he has been accepted to [facility] MWF. Outpatient PD flush also arranged. If here longer than anticipated, will need PD cath flushed this week HTN/ volume - initial CXR mild vasc congestion. LVEDP w/ cath was 18, euvolemic per cards note.  Volume status acceptable on exam. 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 Would avoid ESAs given concern for RCC on CTA abd/pelv MBD ckd - CCa in range. Cont home po vdra. Po4 wnl HTN - BP's were low and wt's are down, norvasc d/c'ed and coreg dose lowered. Bps are now acceptable AMS - per primary Rt renal mass - possible RCC on CT abd 3/1. Will need expedited urology referral as an outpatient Dispo - Discussed with SW later on 2/27, plan is for SNF, transitioned to HD 2/29. Accepted to [facility] HD MWF since he cannot do PD at SNF. Anticipate D/C 3/4 today after HD  OP PD: f/b [facility], gets PD through [facility] in [place]  Per the wife --> 4 exchanges overnight, 3000 fill, 1.5 hr dwell, use greens/ yellows mostly   Dry wt is 269 lbs/ 122kg   CXR - CM, poss vasc congestion, no edema   Kelly Splinter, MD 07/22/2022, 9:31 AM    Subjective:   Patient seen and examined bedside, pt is on dialysis, moaning due to "pain all over".     Objective:   BP (!) 98/58 (BP Location: Right Arm)   Pulse 70   Temp 97.9 F (36.6 C) (Oral)   Resp 17   Ht '6\' 4"'$  (1.93 m) Comment: in  Wt 120.9 kg   SpO2 94%   BMI 32.44 kg/m   Intake/Output Summary (Last 24 hours) at 07/22/2022 0930 Last data filed at 07/21/2022 2000 Gross per 24 hour   Intake 780 ml  Output 150 ml  Net 630 ml    Weight change:   Physical Exam: Gen: NAD, laying flat in bed CVS: RRR Resp: CTA b/l Abd: soft, nt/nd Ext: no edema Neuro: awake, alert Dialysis access: LUE AVF, PD cath c/d/i   Medications:     aspirin EC  81 mg Oral Daily   atorvastatin  40 mg Oral Daily   budesonide  2 mL Inhalation BID   calcium citrate  200 mg of elemental calcium Oral Daily   carvedilol  12.5 mg Oral BID WC   Chlorhexidine Gluconate Cloth  6 each Topical Daily   Chlorhexidine Gluconate Cloth  6 each Topical Q0600   clopidogrel  75 mg Oral Daily   gentamicin cream  1 Application Topical Daily   heparin injection (subcutaneous)  5,000 Units Subcutaneous Q8H    HYDROmorphone (DILAUDID) injection  1 mg Intravenous Once in dialysis   insulin aspart  0-15 Units Subcutaneous TID WC   insulin aspart  0-5 Units Subcutaneous QHS   isosorbide mononitrate  60 mg Oral Daily   multivitamin  1 tablet Oral Daily   pantoprazole  40 mg Oral Daily   sertraline  25 mg Oral Daily   sodium chloride  flush  3 mL Intravenous Q12H

## 2022-07-22 NOTE — TOC Progression Note (Signed)
Transition of Care [facility]) - Progression Note    Patient Details  Name: [name] MRN: [id number] Date of Birth: [birth year]  Transition of Care [facility]) CM/SW [place], LCSW Phone Number: 07/22/2022, 3:50 PM  Clinical Narrative:    RN informed CSW that MD wants to postpone pt dc to snf till tomorrow due to chest pains pt is having. CSW canceled transportation and updated wife.   TOC will continue to follow this admission.    Expected Discharge Plan: [place] Barriers to Discharge: No Barriers Identified  Expected Discharge Plan and Services In-house Referral: NA Discharge Planning Services: CM Consult Post Acute Care Choice: NA Living arrangements for the past 2 months: Single Family Home Expected Discharge Date: 07/22/22               DME Arranged: N/A DME Agency: NA       HH Arranged: NA           Social Determinants of Health (SDOH) Interventions [place]: Low Risk  (07/13/2022)  Depression (PHQ2-9): Medium Risk (04/03/2020)  Tobacco Use: Medium Risk (07/09/2022)    Readmission Risk Interventions    07/10/2022    2:52 PM  Readmission Risk Prevention Plan  Transportation Screening Complete  PCP or Specialist Appt within 3-5 Days Complete  HRI or [place] Complete  Palliative Care Screening Not Applicable  Medication Review (RN Care Manager) Complete  Beckey Rutter, MSW, LCSWA, LCASA Transitions of Care  Clinical Social Worker I

## 2022-07-22 NOTE — Progress Notes (Addendum)
Received patient in bed ,alert and oriented x 4. Moaning and groaning.Vitals were stable.Initiallly complaint of chest pain ,then patient said '' knock me out"He was very anxious.Renal provider made aware.  Accesses used Left upper arm fistula.Dressing has moderately marked dry blood from the previous HD treatment.  Duration of treatment : 3.25  Medicine given: Hydromorphone '1mg'$ .  Fluid removed : 'Make it even ,he is in his dry weight'' as MD said  Hemodialysis issue/comment:Patient needed something to calm him down,he was very anxious,moaning and groaning. He was not able to tolerated his HD today ,blood pressure on the low side .Zero UF goal for today's tx. Hand off to the patient's nurse.

## 2022-07-22 NOTE — Progress Notes (Signed)
14:06 - Report given to [place] from [facility].  15:53 Joi Turnage CSW notified about postponing D/C per MD d/t unrelieved pain. Called SNF at this time to notify without answer.  17:50 Second call to SNF to notify about cancelled D/C; no answer.

## 2022-07-22 NOTE — TOC Transition Note (Signed)
Transition of Care [facility]) - CM/SW Discharge Note   Patient Details  Name: [name] MRN: [id number] Date of Birth: 08/23/1950  Transition of Care [facility]) CM/SW Contact:  Bjorn Pippin, LCSW Phone Number: 07/22/2022, 1:55 PM   Clinical Narrative:    [facility] Anticipated DC date: 07/22/2022 Family notified: Vaughan Basta (wife) Transport by: Corey Harold   Per MD patient ready for DC to [facility]. RN, patient, patient's family, and facility notified of DC. Discharge Summary and FL2 sent to facility. RN to call report prior to discharge [phone]. DC packet on chart. Ambulance transport requested for patient.   CSW will sign off for now as social work intervention is no longer needed. Please consult [place] again if new needs arise.    Final next level of care: [place] Barriers to Discharge: No Barriers Identified   Patient Goals and CMS Choice   Choice offered to / list presented to : NA  Discharge Placement                Patient chooses bed at:  ([place], [place]) Patient to be transferred to facility by: [place] Name of family member notified: Vaughan Basta (wife) Patient and family notified of of transfer: 07/22/22  Discharge Plan and Services Additional resources added to the After Visit Summary for   In-house Referral: NA Discharge Planning Services: CM Consult Post Acute Care Choice: NA          DME Arranged: N/A DME Agency: NA       HH Arranged: NA          Social Determinants of Health (SDOH) Interventions [place]: Low Risk  (07/13/2022)  Depression (PHQ2-9): Medium Risk (04/03/2020)  Tobacco Use: Medium Risk (07/09/2022)     Readmission Risk Interventions    07/10/2022    2:52 PM  Readmission Risk Prevention Plan  Transportation Screening Complete  PCP or Specialist Appt within 3-5 Days Complete  HRI or [place] Complete  Palliative Care Screening Not Applicable  Medication Review (RN  Care Manager) Complete   Beckey Rutter, MSW, LCSWA, LCASA Transitions of Care  Clinical Social Worker I

## 2022-07-22 NOTE — Progress Notes (Signed)
D/C order noted. [place] and spoke to Ar[place]. Clinic aware pt will d/c today and start on Wednesday. D/C summary and last renal note faxed to clinic for continuation of care. Appt info placed on pt's AVS.   Melven Sartorius Renal Navigator [phone]

## 2022-07-22 NOTE — Discharge Summary (Signed)
Physician Discharge Summary  [name] [id number] DOB: [birth year] DOA: 07/07/2022  PCP: Tobe Sos, MD  Admit date: 07/07/2022  Discharge date: 07/23/2022  Admitted From:Home  Disposition:  SNF  Recommendations for Outpatient Follow-up:  Follow up with PCP in 1-2 weeks Follow-up with urology outpatient as recommended for right-sided renal mass concerning for renal cell carcinoma Continue hemodialysis at SNF MWF with next session anticipated 3/6 Continue on medications as noted below  Home Health: None  Equipment/Devices: None  Discharge Condition:Stable  CODE STATUS: DNR  Diet recommendation: Renal/carb plan  Brief/Interim Summary: [name] is an 72 y.o. 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  On aspirin, Plavix, beta-blocker, and statins prior to admission.  Workup revealed NSTEMI.  Was seen by cardiology, post cardiac cath with PCI.  Per cardiology: "Patient has occluded sub branch of the first diagonal. The LCx is occluded. There is moderate disease in the proximal and distal RCA but FFR was OK. There is obstructive disease in the diagonal but this is fairly small in caliber and severely calcified. In short there are no good targets for PCI. We could potentially treat the diagonal but long term patency of this would be poor especially with heavy calcification and ESRD. Will push medical therapy.   He developed some intermittent abdominal pain that appeared to  be related to mesenteric ischemia, however this was ruled out.  He was also seen by GI with no need for endoscopy noted.  It appears that his enlarging right kidney mass may have something to do with this and he has been recommended to follow-up with urology outpatient to further evaluate.  Discharge Diagnoses:  Principal Problem:   Chest pain Active Problems:   Mixed hyperlipidemia   COPD (chronic obstructive pulmonary disease) (HCC)   CAD (coronary artery disease)   Hypokalemia   Diabetes mellitus with nephropathy (HCC)   Hyponatremia   Chronic diastolic CHF (congestive heart failure) (HCC)   ESRD (end stage renal disease) ([place])   Depression   Unstable angina ([place])  Principal discharge diagnosis: NSTEMI with plans for medical therapy.  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  Discharge Instructions  Discharge Instructions     Amb Referral to Cardiac Rehabilitation   Complete by: As directed    Diagnosis: NSTEMI   After initial evaluation and assessments completed: Virtual Based Care may be provided alone or in conjunction with Phase 2 Cardiac Rehab based on patient barriers.: Yes   Intensive Cardiac Rehabilitation (ICR) [place] location only OR Traditional Cardiac Rehabilitation (TCR) *If criteria for ICR are not met will enroll in [facility] only): Yes   Ambulatory referral to Urology   Complete by: As directed    Diet - low sodium heart healthy   Complete by: As directed    Diet - low sodium heart healthy   Complete by: As directed    Diet - low sodium heart healthy   Complete by: As directed    Discharge wound care:   Complete by: As directed  Clean skin near exit site with chloraprep swab sticks.  Starting at catheter, use circular pattern around exit site, moving towards outer edges of area covered by dressing.  Apply gentamicin cream to site once daily.  Cover with dry dressing.   Increase activity slowly   Complete by: As directed     Increase activity slowly   Complete by: As directed    Increase activity slowly   Complete by: As directed    No wound care   Complete by: As directed    No wound care   Complete by: As directed       Allergies as of 07/23/2022       Reactions   Hydrocodone Itching   Hydrocodone-acetaminophen Itching   Other    Opioids cause hallucinations and delusions has to be given risperidone to settle down Other reaction(s): hallucinations/delirium Opiates (Uncoded) Reaction:  Serotonin Syndrome   Oxycodone-acetaminophen Other (See Comments)        Medication List     STOP taking these medications    amLODipine 5 MG tablet Commonly known as: NORVASC   methylPREDNISolone 4 MG Tbpk tablet Commonly known as: MEDROL DOSEPAK       TAKE these medications    acetaminophen 325 MG tablet Commonly known as: TYLENOL Take 650 mg by mouth every 6 (six) hours as needed for mild pain or moderate pain.   albuterol 108 (90 Base) MCG/ACT inhaler Commonly known as: VENTOLIN HFA Inhale 2 puffs into the lungs every 6 (six) hours as needed for wheezing or shortness of breath.   aspirin EC 81 MG tablet Take 1 tablet (81 mg total) by mouth daily.   atorvastatin 40 MG tablet Commonly known as: LIPITOR Take 1 tablet by mouth once daily What changed: when to take this   beclomethasone 80 MCG/ACT inhaler Commonly known as: QVAR Inhale 2 puffs into the lungs daily as needed (wheezing and shortness of breath).   bumetanide 2 MG tablet Commonly known as: BUMEX Take 2 mg by mouth 2 (two) times daily.   calcitRIOL 0.25 MCG capsule Commonly known as: ROCALTROL Take 0.25 mcg by mouth every Monday, Wednesday, and Friday.   CALCIUM CITRATE PO Take 600 mg by mouth in the morning and at bedtime.   carvedilol 12.5 MG tablet Commonly known as: COREG Take 1 tablet (12.5 mg total) by mouth 2 (two) times daily with a meal. What changed:  medication strength how much to take   clobetasol 0.05 %  external solution Commonly known as: TEMOVATE Apply 1 application topically 2 (two) times daily as needed (itching).   denosumab 60 MG/ML Sosy injection Commonly known as: PROLIA Inject 60 mg into the skin every 6 (six) months.   gentamicin cream 0.1 % Commonly known as: GARAMYCIN Apply 1 Application topically daily.   HYDROmorphone 2 MG tablet Commonly known as: Dilaudid Take 0.5 tablets (1 mg total) by mouth every 6 (six) hours as needed for severe pain.   isosorbide mononitrate 30 MG 24 hr tablet Commonly known as: IMDUR Take 3 tablets (90 mg total) by mouth daily. Start taking on: July 24, 2022   linaclotide 290 MCG Caps capsule Commonly known as: LINZESS Take 145-290 mcg by mouth daily as needed (Constipation).   magnesium oxide 400 (240 Mg) MG tablet Commonly known as: MAG-OX Take 400 mg by mouth daily.   Melatonin 10 MG Tbdp Take 10 mg by mouth daily.   multivitamin Tabs tablet Take 1 tablet by mouth daily.   nitroGLYCERIN 0.4  MG SL tablet Commonly known as: NITROSTAT Place 1 tablet (0.4 mg total) under the tongue every 5 (five) minutes as needed for chest pain.   pantoprazole 40 MG tablet Commonly known as: Protonix Take 1 tablet (40 mg total) by mouth daily.   Plavix 75 MG tablet Generic drug: clopidogrel Take 75 mg by mouth daily.   polyethylene glycol 17 g packet Commonly known as: MIRALAX / GLYCOLAX Take 17 g by mouth daily as needed for mild constipation or moderate constipation.   potassium chloride SA 20 MEQ tablet Commonly known as: KLOR-CON M Take 20 mEq by mouth every other day.   risperiDONE 0.5 MG tablet Commonly known as: RISPERDAL Take 1 tablet (0.5 mg total) by mouth at bedtime.   rOPINIRole 1 MG tablet Commonly known as: REQUIP Take 1 tablet (1 mg total) by mouth at bedtime.   sertraline 25 MG tablet Commonly known as: ZOLOFT Take 25 mg by mouth in the morning.   Vitamin D3 50 MCG (2000 UT) Tabs Generic drug:  Cholecalciferol Take 2,000 Units by mouth daily.               Discharge Care Instructions  (From admission, onward)           Start     Ordered   07/22/22 0000  Discharge wound care:       Comments: Clean skin near exit site with chloraprep swab sticks.  Starting at catheter, use circular pattern around exit site, moving towards outer edges of area covered by dressing.  Apply gentamicin cream to site once daily.  Cover with dry dressing.   07/22/22 [id number]            Follow-up Information     Pradhan, Tilden Fossa, MD Follow up.   Specialty: Internal Medicine Why: GO: FEB. 28 AT 12:45PM Contact information: [place] [address] [address]. Go on 07/24/2022.   Why: Schedule is Monday/Wednesday/Friday with 3:15 chair time.  On Mondays and Wednesdays, please arrive at 3:00 pm. On Fridays, please arrive at 2:30 for PD flush prior to HD treatment. Contact information: [phone]               Allergies  Allergen Reactions   Hydrocodone Itching   Hydrocodone-Acetaminophen Itching   Other     Opioids cause hallucinations and delusions has to be given risperidone to settle down Other reaction(s): hallucinations/delirium  Opiates (Uncoded) Reaction:  Serotonin Syndrome     Oxycodone-Acetaminophen Other (See Comments)    Consultations: Cardiology Vascular surgery GI Nephrology Palliative care   Procedures/Studies: ECHOCARDIOGRAM COMPLETE  Result Date: 07/20/2022    ECHOCARDIOGRAM REPORT   Patient Name:   [name] Date of Exam: 07/20/2022 Medical Rec #:  [id number]       Height:       76.0 in Accession #:    [id number]      Weight:       257.5 lb Date of Birth:  22-Nov-1950       BSA:          2.466 m Patient Age:    50 years        BP:           106/50 mmHg Patient Gender: M               HR:           70 bpm. Exam Location:  Inpatient  Procedure: 2D Echo, Cardiac Doppler and Color Doppler Indications:     Chest Pain R07.9  History:        Patient has prior history of Echocardiogram examinations, most                 recent 10/11/2021. CAD, COPD and BUERGER'S DISEASE; Risk                 Factors:Diabetes, TOBACCO ABUSE and Dyslipidemia.  Sonographer:    Luane School RDCS Referring Phys: [id number] [facility]  Sonographer Comments: No subcostal window. IMPRESSIONS  1. Left ventricular ejection fraction, by estimation, is 60 to 65%. The left ventricle has normal function. The left ventricle has no regional wall motion abnormalities. There is moderate left ventricular hypertrophy. Left ventricular diastolic parameters are indeterminate.  2. Right ventricular systolic function is mildly reduced. The right ventricular size is normal.  3. The mitral valve is degenerative. Trivial mitral valve regurgitation. No evidence of mitral stenosis. Moderate mitral annular calcification.  4. The aortic valve was not well visualized. Aortic valve regurgitation is not visualized. Aortic valve sclerosis/calcification is present, without any evidence of aortic stenosis. FINDINGS  Left Ventricle: Left ventricular ejection fraction, by estimation, is 60 to 65%. The left ventricle has normal function. The left ventricle has no regional wall motion abnormalities. The left ventricular internal cavity size was normal in size. There is  moderate left ventricular hypertrophy. Left ventricular diastolic parameters are indeterminate. Right Ventricle: The right ventricular size is normal. No increase in right ventricular wall thickness. Right ventricular systolic function is mildly reduced. Left Atrium: Left atrial size was normal in size. Right Atrium: Right atrial size was normal in size. Pericardium: Trivial pericardial effusion is present. Mitral Valve: The mitral valve is degenerative in appearance. Moderate mitral annular calcification. Trivial mitral valve regurgitation. No evidence of mitral valve stenosis. Tricuspid Valve: The tricuspid  valve is normal in structure. Tricuspid valve regurgitation is trivial. Aortic Valve: The aortic valve was not well visualized. Aortic valve regurgitation is not visualized. Aortic valve sclerosis/calcification is present, without any evidence of aortic stenosis. Pulmonic Valve: The pulmonic valve was not well visualized. Pulmonic valve regurgitation is not visualized. Aorta: The aortic root and ascending aorta are structurally normal, with no evidence of dilitation. IAS/Shunts: The interatrial septum was not well visualized.  LEFT VENTRICLE PLAX 2D LVIDd:         5.40 cm   Diastology LVIDs:         3.30 cm   LV e' medial:    3.15 cm/s LV PW:         1.40 cm   LV E/e' medial:  40.3 LV IVS:        1.50 cm   LV e' lateral:   4.57 cm/s LVOT diam:     2.10 cm   LV E/e' lateral: 27.8 LV SV:         63 LV SV Index:   26 LVOT Area:     3.46 cm  RIGHT VENTRICLE RV S prime:     8.70 cm/s TAPSE (M-mode): 2.0 cm LEFT ATRIUM             Index        RIGHT ATRIUM           Index LA diam:        5.30 cm 2.15 cm/m   RA Area:     15.40 cm LA Vol (A2C):   72.2 ml 29.27 ml/m  RA Volume:   37.10 ml  15.04 ml/m LA Vol (A4C):   63.3 ml 25.67 ml/m LA Biplane Vol: 67.5 ml 27.37 ml/m  AORTIC VALVE LVOT Vmax:   88.40 cm/s LVOT Vmean:  57.500 cm/s LVOT VTI:    0.183 m  AORTA Ao Root diam: 2.90 cm Ao Asc diam:  3.50 cm MITRAL VALVE                TRICUSPID VALVE MV Area (PHT): 3.74 cm     TR Peak grad:   29.8 mmHg MV Decel Time: 203 msec     TR Vmax:        273.00 cm/s MV E velocity: 127.00 cm/s                             SHUNTS                             Systemic VTI:  0.18 m                             Systemic Diam: 2.10 cm Albert Milian MD Electronically signed by Albert Milian MD Signature Date/Time: 07/20/2022/1:21:17 PM    Final    CT Angio Abd/Pel w/ and/or w/o  Result Date: 07/20/2022 CLINICAL DATA:  Epigastric discomfort. Assess for mesenteric ischemia. EXAM: CTA ABDOMEN AND PELVIS WITHOUT AND WITH CONTRAST  TECHNIQUE: Multidetector CT imaging of the abdomen and pelvis was performed using the standard protocol during bolus administration of intravenous contrast. Multiplanar reconstructed images and MIPs were obtained and reviewed to evaluate the vascular anatomy. RADIATION DOSE REDUCTION: This exam was performed according to the departmental dose-optimization program which includes automated exposure control, adjustment of the mA and/or kV according to patient size and/or use of iterative reconstruction technique. CONTRAST:  143m OMNIPAQUE IOHEXOL 350 MG/ML SOLN COMPARISON:  Prior CT scan of the abdomen and pelvis 04/09/2022 FINDINGS: VASCULAR Aorta: Mild fusiform aneurysmal dilation of the infrarenal abdominal aorta with a maximal transverse diameter of 3.7 cm (axial and reformatted images are concordant). Scattered atherosclerotic plaque. No evidence of dissection. Celiac: Calcified atherosclerotic plaque results in mild stenosis of the origin of the celiac artery. SMA: Calcified atherosclerotic plaque results in mild stenosis of the origin of the SMA. The vessel remains patent. Renals: Heavily calcified atherosclerotic plaque results in significant moderate to advanced stenosis of both renal arteries. IMA: Patent without evidence of aneurysm, dissection, vasculitis or significant stenosis. Inflow: Tortuous heavily calcified iliac vessels. 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 Probable mild to moderate regions of stenosis in the external iliac arteries bilaterally but no evidence of occlusion. Proximal Outflow: Heavily calcified atherosclerotic plaque in both common femoral arteries likely resulting in significant stenosis. Veins: No focal venous abnormality. Review of the MIP images confirms the above findings. NON-VASCULAR Lower chest: Partially imaged cardiac rhythm maintenance device in the right heart. The heart is normal in size. No pericardial effusion. Unremarkable distal  thoracic esophagus. No acute abnormality in the lung bases. Hepatobiliary: No focal liver abnormality is seen. Status post cholecystectomy. No biliary dilatation. Pancreas: Unremarkable. No pancreatic ductal dilatation or surrounding inflammatory changes. Spleen: Normal in size without focal abnormality. Adrenals/Urinary Tract: Normal adrenal glands. Chronic atrophy of the kidneys bilaterally. Intermediate attenuation lesion exophytic from the posterior upper pole of the right kidney appears to be  slightly enlarging comparing across prior studies dating back to March of 2021. Today, the lesion measures 2.3 x 1.6 cm compared to approximately 1.4 by 1.3 cm previously. No hydronephrosis or nephrolithiasis. No other renal lesion identified. The ureters and bladder are grossly unremarkable. Stomach/Bowel: No focal bowel wall thickening or evidence of obstruction. Lymphatic: No suspicious lymphadenopathy. Reproductive: Brachytherapy seeds are present in the prostate gland. Other: Tenckhoff peritoneal dialysis catheter present coiled in the right lower quadrant. Trace ascites as expected. No abdominal wall hernia. Musculoskeletal: Prior L2 compression fracture status post cement augmentation. Evidence of prior surgery at L4-L5 and L5-S1 with discectomy and interbody prostheses. No acute fracture or malalignment. IMPRESSION: VASCULAR 1. 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 Recommend follow-up every 2 years. This recommendation follows ACR consensus guidelines: White Paper of the ACR Incidental Findings Committee II on Vascular Findings. J Am Coll Radiol 2013; 10:789-794. Aortic aneurysm NOS (ICD10-I71.9). 2. 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 3. Additional scattered atherosclerotic vascular calcifications. Aortic Atherosclerosis (ICD10-I70.0). 4. 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 NON-VASCULAR 1. Slight  interval enlargement of intermediate density lesion exophytic from the upper pole of the right kidney now measuring 2.3 x 1.6 cm compared to 1.4 x 1.3 cm in March of 2021. 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 Consider referral to urology for further evaluation. 2. No acute abnormality within the abdomen or pelvis. 3. Tenckhoff peritoneal dialysis catheter in place. 4. Additional ancillary findings as above without interval change. Signed, Criselda Peaches, MD, [facility] Electronically Signed   By: Jacqulynn Cadet M.D.   On: 07/20/2022 07:07   CARDIAC CATHETERIZATION  Result Date: 07/08/2022   Mid LM to Dist LM lesion is 25% stenosed.   Mid LAD lesion is 40% stenosed.   Ost Cx to Prox Cx lesion is 100% stenosed.   Prox RCA lesion is 50% stenosed.   Dist RCA lesion is 60% stenosed.   1st Diag-2 lesion is 100% stenosed.   1st Diag-1 lesion is 80% stenosed. 1.  Largely unchanged coronary artery disease with chronic total occlusion of inferior branch of bifurcating diagonal and chronic total occlusion of the proximal left circumflex.  2.  Moderate in-stent restenosis of proximal right coronary artery stent with an associated RFR of 0.94. 3.  Moderate distal right coronary artery lesion with an RFR of 0.91. 4.  LVEDP of 18 mmHg. Recommendation: The images and RFR data were reviewed with Dr. Martinique the team C attending; medical therapy will be pursued.   DG Chest Port 1 View  Result Date: 07/07/2022 CLINICAL DATA:  72 year old male with history of chest pain and weakness. EXAM: PORTABLE CHEST 1 VIEW COMPARISON:  Chest x-ray 04/09/2022. FINDINGS: Lung volumes are normal. No consolidative airspace disease. No pleural effusions. No pneumothorax. Cephalization of the pulmonary vasculature, without frank pulmonary edema. Mild cardiomegaly. Upper mediastinal contours are within normal limits.  Atherosclerotic calcifications are noted in the thoracic aorta. Left-sided pacemaker device in place with lead tips projecting over the expected location of the right atrium and right ventricle. IMPRESSION: 1. Cardiomegaly with pulmonary venous congestion, but no frank pulmonary edema. 2. Aortic atherosclerosis. Electronically Signed   By: Vinnie Langton M.D.   On: 07/07/2022 17:40     Discharge Exam: Vitals:   07/23/22 0818 07/23/22 1127  BP:  (!) 121/59  Pulse:  70  Resp:  (!) 22  Temp:  97.6 F (36.4 C)  SpO2: 97% 98%   Vitals:   07/23/22 0419 07/23/22 0741 07/23/22 0818 07/23/22 1127  BP: 137/60 122/83  (!) 121/59  Pulse: 65 63  70  Resp: 19 (!) 24  (!) 22  Temp: 97.6 F (36.4 C) 97.7 F (36.5 C)  97.6 F (36.4 C)  TempSrc: Oral Oral  Oral  SpO2: 100% 97% 97% 98%  Weight:      Height:        General: Pt is alert, awake, not in acute distress Cardiovascular: RRR, S1/S2 +, no rubs, no gallops Respiratory: CTA bilaterally, no wheezing, no rhonchi Abdominal: Soft, NT, ND, bowel sounds + Extremities: no edema, no cyanosis    The results of significant diagnostics from this hospitalization (including imaging, microbiology, ancillary and laboratory) are listed below for reference.     Microbiology: No results found for this or any previous visit (from the past 240 hour(s)).   Labs: BNP (last 3 results) Recent Labs    04/10/22 0451  BNP [id number]*   Basic Metabolic Panel: Recent Labs  Lab 07/17/22 1033 07/18/22 0817 07/18/22 1626 07/19/22 0904 07/20/22 0127 07/23/22 0057  NA 129* 130*  --  131* 132* 131*  K 3.1* 2.9* 3.5 3.3* 3.8 3.8  CL 88* 90*  --  94* 93* 94*  CO2 26 23  --  '27 31 29  '$ GLUCOSE 162* 96  --  124* 106* 91  BUN 69* 74*  --  45* 31* 26*  CREATININE 7.40* 7.57*  --  5.73* 4.42* 4.81*  CALCIUM 8.2* 8.2*  --  7.8* 7.8* 8.0*  MG  --   --  2.4  --   --  2.7*  PHOS  --  6.4*  --  3.9 3.1  --    Liver Function Tests: Recent Labs  Lab  07/18/22 0817 07/19/22 0904 07/20/22 0127  ALBUMIN 2.2* 2.3* 2.4*   No results for input(s): "LIPASE", "AMYLASE" in the last 168 hours. No results for input(s): "AMMONIA" in the last 168 hours. CBC: Recent Labs  Lab 07/18/22 0817 07/19/22 0904 07/20/22 0127 07/23/22 0057  WBC 7.1 7.3 7.2 7.1  HGB 10.1* 10.5* 10.0* 9.5*  HCT 29.3* 30.9* 30.8* 27.7*  MCV 102.8* 104.7* 106.2* 104.1*  PLT 152 151 156 156   Cardiac Enzymes: No results for input(s): "CKTOTAL", "CKMB", "CKMBINDEX", "TROPONINI" in the last 168 hours. BNP: Invalid input(s): "POCBNP" CBG: Recent Labs  Lab 07/22/22 1607 07/22/22 2117 07/23/22 0610 07/23/22 0634 07/23/22 1029  GLUCAP 102* 102* 68* 76 108*   D-Dimer No results for input(s): "DDIMER" in the last 72 hours. Hgb A1c No results for input(s): "HGBA1C" in the last 72 hours. Lipid Profile No results for input(s): "CHOL", "HDL", "LDLCALC", "TRIG", "CHOLHDL", "LDLDIRECT" in the last 72 hours. Thyroid function studies No results for input(s): "TSH", "T4TOTAL", "T3FREE", "THYROIDAB" in the last 72 hours.  Invalid input(s): "FREET3" Anemia work up No results for input(s): "VITAMINB12", "FOLATE", "FERRITIN", "TIBC", "IRON", "RETICCTPCT" in the last 72 hours. Urinalysis    Component Value Date/Time   COLORURINE YELLOW 07/22/2019 1625   APPEARANCEUR Clear 12/14/2021 0820   LABSPEC 1.010 07/22/2019 1625   PHURINE 6.0 07/22/2019 1625   GLUCOSEU Trace (A) 12/14/2021 0820   HGBUR NEGATIVE 07/22/2019 1625   BILIRUBINUR Negative 12/14/2021 0820   KETONESUR NEGATIVE 07/22/2019 1625   PROTEINUR 2+ (A) 12/14/2021 0820   PROTEINUR >=300 (A) 07/22/2019 1625   UROBILINOGEN 0.2  09/27/2014 0027   NITRITE Negative 12/14/2021 0820   NITRITE NEGATIVE 07/22/2019 1625   LEUKOCYTESUR Negative 12/14/2021 0820   LEUKOCYTESUR NEGATIVE 07/22/2019 1625   Sepsis Labs Recent Labs  Lab 07/18/22 0817 07/19/22 0904 07/20/22 0127 07/23/22 0057  WBC 7.1 7.3 7.2 7.1    Microbiology No results found for this or any previous visit (from the past 240 hour(s)).   Time coordinating discharge: 35 minutes  SIGNED:   Rodena Goldmann, DO [facility] 07/23/2022, 12:09 PM  If 7PM-7AM, please contact night-coverage [email or web address]

## 2022-07-23 DIAGNOSIS — R079 Chest pain, unspecified: Secondary | ICD-10-CM | POA: Diagnosis not present

## 2022-07-23 LAB — CBC
HCT: 27.7 % — ABNORMAL LOW (ref 39.0–52.0)
Hemoglobin: 9.5 g/dL — ABNORMAL LOW (ref 13.0–17.0)
MCH: 35.7 pg — ABNORMAL HIGH (ref 26.0–34.0)
MCHC: 34.3 g/dL (ref 30.0–36.0)
MCV: 104.1 fL — ABNORMAL HIGH (ref 80.0–100.0)
Platelets: 156 10*3/uL (ref 150–400)
RBC: 2.66 MIL/uL — ABNORMAL LOW (ref 4.22–5.81)
RDW: 14.1 % (ref 11.5–15.5)
WBC: 7.1 10*3/uL (ref 4.0–10.5)
nRBC: 0 % (ref 0.0–0.2)

## 2022-07-23 LAB — BASIC METABOLIC PANEL
Anion gap: 8 (ref 5–15)
BUN: 26 mg/dL — ABNORMAL HIGH (ref 8–23)
CO2: 29 mmol/L (ref 22–32)
Calcium: 8 mg/dL — ABNORMAL LOW (ref 8.9–10.3)
Chloride: 94 mmol/L — ABNORMAL LOW (ref 98–111)
Creatinine, Ser: 4.81 mg/dL — ABNORMAL HIGH (ref 0.61–1.24)
GFR, Estimated: 12 mL/min — ABNORMAL LOW (ref >60)
Glucose, Bld: 91 mg/dL (ref 70–99)
Potassium: 3.8 mmol/L (ref 3.5–5.1)
Sodium: 131 mmol/L — ABNORMAL LOW (ref 135–145)

## 2022-07-23 LAB — GLUCOSE, CAPILLARY
Glucose-Capillary: 68 mg/dL — ABNORMAL LOW (ref 70–99)
Glucose-Capillary: 76 mg/dL (ref 70–99)
Glucose-Capillary: 108 mg/dL — ABNORMAL HIGH (ref 70–99)

## 2022-07-23 LAB — MAGNESIUM: Magnesium: 2.7 mg/dL — ABNORMAL HIGH (ref 1.7–2.4)

## 2022-07-23 MED ORDER — ISOSORBIDE MONONITRATE ER 30 MG PO TB24: 90.0000 mg | ORAL_TABLET | Freq: Every day | ORAL | 0 refills | Status: DC

## 2022-07-23 MED ORDER — ISOSORBIDE MONONITRATE ER 60 MG PO TB24
90.0000 mg | ORAL_TABLET | Freq: Every day | ORAL | Status: DC
  Administered 2022-07-23: 90 mg via ORAL
  Filled 2022-07-23: qty 1

## 2022-07-23 MED ORDER — MORPHINE SULFATE (PF) 2 MG/ML IV SOLN
2.0000 mg | INTRAVENOUS | Status: DC | PRN
  Administered 2022-07-23 (×3): 2 mg via INTRAVENOUS
  Filled 2022-07-23 (×3): qty 1

## 2022-07-23 NOTE — Progress Notes (Signed)
Mobility Specialist - Progress Note   07/23/22 1400  Mobility  Activity Transferred from bed to chair  Level of Assistance Standby assist, set-up cues, supervision of patient - no hands on  Assistive Device Front wheel walker  Distance Ambulated (ft) 4 ft  Activity Response Tolerated well  Mobility Referral Yes  $Mobility charge 1 Mobility    Pt received in bed requesting to sit in recliner. No physical assistance needed, no complaints throughout. Left in recliner w/ chair alarm on and call bell at his side.   [place] Specialist Please contact via SecureChat or Rehab office at [phone]

## 2022-07-23 NOTE — Progress Notes (Addendum)
D/C order noted. [place] and spoke to [place]. Clinic advised pt's d/c was cancelled yesterday but pt to d/c to snf today. Faxed updated d/c summary and today's progress notes to clinic for continuation of care. Clinic advised pt should start tomorrow.   Melven Sartorius Renal Navigator [phone]

## 2022-07-23 NOTE — TOC Progression Note (Signed)
Transition of Care [facility]) - Progression Note    Patient Details  Name: [name] MRN: [id number] Date of Birth: July 25, 1950  Transition of Care [facility]) CM/SW [place], LCSW Phone Number: 07/23/2022, 11:56 AM  Clinical Narrative:    CSW spoke with pt and wife regarding SNF. Pt wife reported they wanted to do everything they can to help pt and was not interested in hospice care at this time. Wife did state that they would like to have palliative follow the pt at SNF.   CSW contacted [facility] and spoke with Silva Bandy (RN admissions coordinator) to see if SNF had palliative services that could follow pt at [facility]. SNF reported they do not have palliative and hospice services for pt that are doing rehab. Silva Bandy reported they do have an area that is comfort care that is similar to LTC and that is not covered by pt insurance.   CSW communicated this information with pt wife. Wife stated that they will still like to pursue rehab.   TOC will continue to follow this admission.    Expected Discharge Plan: [place] Barriers to Discharge: No Barriers Identified  Expected Discharge Plan and Services In-house Referral: NA Discharge Planning Services: CM Consult Post Acute Care Choice: NA Living arrangements for the past 2 months: Single Family Home Expected Discharge Date: 07/22/22               DME Arranged: N/A DME Agency: NA       HH Arranged: NA           Social Determinants of Health (SDOH) Interventions [place]: Low Risk  (07/13/2022)  Depression (PHQ2-9): Medium Risk (04/03/2020)  Tobacco Use: Medium Risk (07/09/2022)    Readmission Risk Interventions    07/10/2022    2:52 PM  Readmission Risk Prevention Plan  Transportation Screening Complete  PCP or Specialist Appt within 3-5 Days Complete  HRI or [place] Complete  Palliative Care Screening Not Applicable  Medication Review (RN Care Manager) Complete   Beckey Rutter, MSW, LCSWA, LCASA Transitions of Care  Clinical Social Worker I

## 2022-07-23 NOTE — Progress Notes (Signed)
Inpatient Diabetes Program Recommendations  AACE/ADA: New Consensus Statement on Inpatient Glycemic Control (2015)  Target Ranges:  Prepandial:   less than 140 mg/dL      Peak postprandial:   less than 180 mg/dL (1-2 hours)      Critically ill patients:  140 - 180 mg/dL   Lab Results  Component Value Date   GLUCAP 108 (H) 07/23/2022   HGBA1C 5.4 04/10/2022    Review of Glycemic Control  Latest Reference Range & Units 07/22/22 16:07 07/22/22 21:17 07/23/22 06:10 07/23/22 06:34 07/23/22 10:29  Glucose-Capillary 70 - 99 mg/dL 102 (H) 102 (H) 68 (L) 76 108 (H)   Diabetes history:  DM 2 Outpatient Diabetes medications:  None noted Current orders for Inpatient glycemic control:  Novolog 0-15 units tid with meals  Inpatient Diabetes Program Recommendations:    Please consider reducing Novolog correction to very sensitive (0-6 units) tid with meals.   Thanks,  Adah Perl, RN, BC-ADM Inpatient Diabetes Coordinator Pager [phone]  (8a-5p)

## 2022-07-23 NOTE — Progress Notes (Signed)
  [facility] Progress Note    Assessment/ Plan:   Chest pain - hx of CAD. LHC done, pt has lots of diffuse disease w/ no good targets for stenting. Medical Rx per cardiology recs. Avoid morphine.  ESRD - on PD, lives in [place], [place]. Last PD 2/17-2/28 overnight. Plan is to transition to HD via his LUE AVF given dispo. HD#1 2/29. HD on MWF given that he has been accepted to [facility] MWF. Outpatient PD flush also arranged. If here longer than anticipated, will need PD cath flushed this week HTN/ volume - initial CXR mild vasc congestion. LVEDP w/ cath was 18, euvolemic per cards note.  Volume status acceptable on exam. 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 Would avoid ESAs given concern for RCC on CTA abd/pelv MBD ckd - CCa in range. Cont home po vdra. Po4 wnl HTN - BP's were low and wt's are down, norvasc d/c'ed and coreg dose lowered. Bps are now acceptable AMS - per primary Rt renal mass - possible RCC on CT abd 3/1. Will need expedited urology referral as an outpatient Dispo - Discussed with SW later on 2/27, plan is for SNF, transitioned to HD 2/29. Accepted to [facility] HD MWF since he cannot do PD at SNF. Anticipate D/C 3/4 today after HD  OP PD: f/b [facility], gets PD through [facility] in [place]  Per the wife --> 4 exchanges overnight, 3000 fill, 1.5 hr dwell, use greens/ yellows mostly   Dry wt is 269 lbs/ 122kg   CXR - CM, poss vasc congestion, no edema   Kelly Splinter, MD 07/23/2022, 2:48 PM    Subjective:   Patient seen and examined bedside, pt is on dialysis, moaning due to "pain all over".     Objective:   BP (!) 121/59 (BP Location: Right Arm)   Pulse 70   Temp 97.6 F (36.4 C) (Oral)   Resp (!) 22   Ht '6\' 4"'$  (1.93 m) Comment: in  Wt 120.9 kg   SpO2 98%   BMI 32.44 kg/m   Intake/Output Summary (Last 24 hours) at 07/23/2022 1448 Last data filed at 07/23/2022 0422 Gross per 24 hour  Intake 240 ml  Output 300  ml  Net -60 ml    Weight change: 1.2 kg  Physical Exam: Gen: NAD, laying flat in bed CVS: RRR Resp: CTA b/l Abd: soft, nt/nd Ext: no edema Neuro: awake, alert Dialysis access: LUE AVF, PD cath c/d/i   Medications:     aspirin EC  81 mg Oral Daily   atorvastatin  40 mg Oral Daily   budesonide  2 mL Inhalation BID   calcium citrate  200 mg of elemental calcium Oral Daily   carvedilol  12.5 mg Oral BID WC   Chlorhexidine Gluconate Cloth  6 each Topical Daily   Chlorhexidine Gluconate Cloth  6 each Topical Q0600   clopidogrel  75 mg Oral Daily   gentamicin cream  1 Application Topical Daily   heparin injection (subcutaneous)  5,000 Units Subcutaneous Q8H   insulin aspart  0-15 Units Subcutaneous TID WC   insulin aspart  0-5 Units Subcutaneous QHS   isosorbide mononitrate  90 mg Oral Daily   multivitamin  1 tablet Oral Daily   pantoprazole  40 mg Oral Daily   sertraline  25 mg Oral Daily   sodium chloride flush  3 mL Intravenous Q12H

## 2022-07-23 NOTE — Progress Notes (Signed)
PROGRESS NOTE    [name]  [id number] DOB: 27-May-1950 DOA: 07/07/2022 PCP: Tobe Sos, MD   Brief Narrative:    [name] is an 72 y.o. 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  On aspirin, Plavix, beta-blocker, and statins prior to admission.  Workup revealed NSTEMI.  Was seen by cardiology, post cardiac cath with PCI.  Per cardiology: "Patient has occluded sub branch of the first diagonal. The LCx is occluded. There is moderate disease in the proximal and distal RCA but FFR was OK. There is obstructive disease in the diagonal but this is fairly small in caliber and severely calcified. In short there are no good targets for PCI. We could potentially treat the diagonal but long term patency of this would be poor especially with heavy calcification and ESRD. Will push medical therapy.  He now continues to have some intermittent abdominal pain that does not appear to be related to mesenteric ischemia.  GI evaluated abdominal pain and this appears to be more functional abdominal pain that may be related to findings of kidney enlargement on CT. He was being planned for discharge 3/4 to SNF, however continued to have significant ongoing chest pain.  Imdur dose increased to 90 mg on 3/5-palliative consulted as he appears to be more appropriate for hospice at this point as opposed to SNF/rehab.  Assessment & Plan:   Principal Problem:   Chest pain Active Problems:   Mixed  hyperlipidemia   COPD (chronic obstructive pulmonary disease) (HCC)   CAD (coronary artery disease)   Hypokalemia   Diabetes mellitus with nephropathy (HCC)   Hyponatremia   Chronic diastolic CHF (congestive heart failure) (HCC)   ESRD (end stage renal disease) (HCC)   Depression   Unstable angina (HCC)  Assessment and Plan:   NSTEMI with chronic anginal pain: Continue aspirin, statins, Coreg, Plavix and Imdur. Plan to discharge to SNF, [facility] in [place]. Plan to continue with hemodialysis Monday Wednesday Friday at [facility] in [place]. Plan was for discharge to SNF/rehab 3/4, hold for now due to ongoing pain and have palliative care further evaluate Increased Imdur dose to 90 mg 3/5 Patient not a candidate for Ran[place]a due to renal function   Epigastric pain in the setting of celiac artery/superior mesenteric artery stenosis greater than 70% from mesenteric ultrasound. CTA abdomen pelvis with no significant findings No need for further evaluation and management per vascular surgery Patient continues to have some ongoing pain concerns intermittently Consultation to GI appreciated, with no need for EGD  Enlarging right kidney lesion seen on CT angiogram Could possibly be related to his abdominal pain, but unlikely Will need outpatient follow-up with urology outpatient due to concern for slow-growing neoplasm such as papillary renal cell carcinoma   A-fib/atrial flutter, not on oral anticoagulation: Not anticoagulation due to history of anemia and nonbleeding AVMs in June 2022 seen on colonoscopy. Hemoglobin has remained stable while on DAPT therapy. Continue to monitor on telemetry   HFpEF Continue home regimen.   Resolved, symptomatic bradycardia: Status  post pacemaker.   End-stage renal disease on peritoneal dialysis: Tolerated hemodialysis well.  Hemodialyze on 07/18/2022. Continues on hemodialysis per nephrology recommendations with next session  planned 3/6   Anemia of chronic renal disease: Hemoglobin has remained relatively stable continue to monitor intermittently. No overt bleeding reported   Metabolic bone disease: Continue PhosLo BID added.   Hyponatremia-stable Increase oral protein calorie intake.   Diabetes mellitus type 2 with nephropathy: Controlled on sliding scale insulin Continue current management.   COPD: Continue home inhalers.   Hyperlipidemia:  continue statins.   Class I morbid obesity: Follow-up with PCP as an outpatient will need further evaluation for sleep apnea as an outpatient.   Resolved metabolic encephalopathy   Goals of care: Made DNR  Seen by palliative care team, reconsulted again due to ongoing pain issues with potential need for hospice    DVT prophylaxis:Heparin Code Status: DNR Family Communication: Wife on phone 3/5 Disposition Plan:  Status is: Inpatient Remains inpatient appropriate because: Need for IV medications.   Consultants:  Vascular surgery Nephrology GI  Procedures:  None  Antimicrobials:  Anti-infectives (From admission, onward)    None      Subjective: Patient seen and evaluated today with ongoing pain complaints that he states is "all over" but really points to his chest and upper abdomen.  Pain has been going on all night and he specifically requests IV Dilaudid for pain control if possible.  Discharge to SNF held yesterday on account of ongoing pain.  Objective: Vitals:   07/22/22 2346 07/23/22 0419 07/23/22 0741 07/23/22 0818  BP: 137/71 137/60 122/83   Pulse: 71 65 63   Resp: (!) 22 19 (!) 24   Temp: 97.6 F (36.4 C) 97.6 F (36.4 C) 97.7 F (36.5 C)   TempSrc: Oral Oral Oral   SpO2: 96% 100% 97% 97%  Weight:      Height:        Intake/Output Summary (Last 24 hours) at 07/23/2022 0918 Last data filed at 07/23/2022 0422 Gross per 24 hour  Intake 240 ml  Output 300 ml  Net -60 ml   Filed Weights   07/22/22 0500 07/22/22 0845  07/22/22 1200  Weight: 120.9 kg 120.9 kg 120.9 kg    Examination:  General exam: Appears anxious and uncomfortable Respiratory system: Clear to auscultation. Respiratory effort normal.  Nasal cannula Cardiovascular system: S1 & S2 heard, RRR.  Gastrointestinal system: Abdomen is soft Central nervous system: Alert and awake Extremities: No edema Skin: No significant lesions noted Psychiatry: Flat affect.    Data Reviewed: I have personally reviewed following labs and imaging studies  CBC: Recent Labs  Lab 07/18/22 0817 07/19/22 0904 07/20/22 0127 07/23/22 0057  WBC 7.1 7.3 7.2 7.1  HGB 10.1* 10.5* 10.0* 9.5*  HCT 29.3* 30.9* 30.8* 27.7*  MCV 102.8* 104.7* 106.2* 104.1*  PLT 152 151 156 [id number]   Basic Metabolic Panel: Recent Labs  Lab 07/17/22 1033 07/18/22 0817 07/18/22 1626 07/19/22 0904 07/20/22 0127 07/23/22 0057  NA 129* 130*  --  131* 132* 131*  K 3.1* 2.9* 3.5 3.3* 3.8 3.8  CL 88* 90*  --  94* 93* 94*  CO2 26 23  --  '27 31 29  '$ GLUCOSE 162* 96  --  124* 106* 91  BUN 69* 74*  --  45* 31* 26*  CREATININE 7.40* 7.57*  --  5.73* 4.42* 4.81*  CALCIUM 8.2* 8.2*  --  7.8* 7.8* 8.0*  MG  --   --  2.4  --   --  2.7*  PHOS  --  6.4*  --  3.9 3.1  --    GFR: Estimated Creatinine Clearance: 20.6 mL/min (A) (by C-G formula based on SCr of 4.81 mg/dL (H)). Liver Function Tests: Recent Labs  Lab 07/18/22 0817 07/19/22 0904 07/20/22 0127  ALBUMIN 2.2* 2.3* 2.4*   No results for input(s): "LIPASE", "AMYLASE" in the last 168 hours. No results for input(s): "AMMONIA" in the last 168 hours. Coagulation Profile: No results for input(s): "INR", "PROTIME" in the last 168 hours. Cardiac Enzymes: No results for input(s): "CKTOTAL", "CKMB", "CKMBINDEX", "TROPONINI" in the last 168 hours. BNP (last 3 results) No results for input(s): "PROBNP" in the last 8760 hours. HbA1C: No results for input(s): "HGBA1C" in the last 72 hours. CBG: Recent Labs  Lab 07/22/22 0611  07/22/22 1607 07/22/22 2117 07/23/22 0610 07/23/22 0634  GLUCAP 76 102* 102* 68* 76   Lipid Profile: No results for input(s): "CHOL", "HDL", "LDLCALC", "TRIG", "CHOLHDL", "LDLDIRECT" in the last 72 hours. Thyroid Function Tests: No results for input(s): "TSH", "T4TOTAL", "FREET4", "T3FREE", "THYROIDAB" in the last 72 hours. Anemia Panel: No results for input(s): "VITAMINB12", "FOLATE", "FERRITIN", "TIBC", "IRON", "RETICCTPCT" in the last 72 hours. Sepsis Labs: No results for input(s): "PROCALCITON", "LATICACIDVEN" in the last 168 hours.  No results found for this or any previous visit (from the past 240 hour(s)).       Radiology Studies: No results found.      Scheduled Meds:  aspirin EC  81 mg Oral Daily   atorvastatin  40 mg Oral Daily   budesonide  2 mL Inhalation BID   calcium citrate  200 mg of elemental calcium Oral Daily   carvedilol  12.5 mg Oral BID WC   Chlorhexidine Gluconate Cloth  6 each Topical Daily   Chlorhexidine Gluconate Cloth  6 each Topical Q0600   clopidogrel  75 mg Oral Daily   gentamicin cream  1 Application Topical Daily   heparin injection (subcutaneous)  5,000 Units Subcutaneous Q8H   insulin aspart  0-15 Units Subcutaneous TID WC   insulin aspart  0-5 Units Subcutaneous QHS   isosorbide mononitrate  90 mg Oral Daily   multivitamin  1 tablet Oral Daily   pantoprazole  40 mg Oral Daily   sertraline  25 mg Oral Daily   sodium chloride flush  3 mL Intravenous Q12H   Continuous Infusions:  sodium chloride       LOS: 15 days    Time spent: 35 minutes    Verdell Dykman Darleen Crocker, DO [facility]  If 7PM-7AM, please contact night-coverage [email or web address] 07/23/2022, 9:18 AM

## 2022-07-23 NOTE — Progress Notes (Signed)
Patient required additional stay overnight due to ongoing pain concerns. Wife and patient would like to discharge to SNF today and would like to try rehab. He was able to participate adequately with PT. Outpatient palliative to follow.

## 2022-07-23 NOTE — TOC Transition Note (Signed)
Transition of Care [facility]) - CM/SW Discharge Note   Patient Details  Name: [name] MRN: [id number] Date of Birth: 12/24/1950  Transition of Care [facility]) CM/SW Contact:  Bjorn Pippin, LCSW Phone Number: 07/23/2022, 3:35 PM   Clinical Narrative:    [facility] Anticipated DC date: 07/23/2022 Family notified: Vaughan Basta (wife) Transport by: Corey Harold   Per MD patient ready for DC to [facility]. RN, patient, patient's family, and facility notified of DC. Discharge Summary and FL2 sent to facility. RN to call report prior to discharge [phone]. DC packet on chart. Ambulance transport requested for patient.   CSW will sign off for now as social work intervention is no longer needed. Please consult [place] again if new needs arise.    Final next level of care: [place] Barriers to Discharge: No Barriers Identified   Patient Goals and CMS Choice   Choice offered to / list presented to : NA  Discharge Placement                Patient chooses bed at:  ([place], [place]) Patient to be transferred to facility by: [place] Name of family member notified: Vaughan Basta (wife) Patient and family notified of of transfer: 07/22/22  Discharge Plan and Services Additional resources added to the After Visit Summary for   In-house Referral: NA Discharge Planning Services: CM Consult Post Acute Care Choice: NA          DME Arranged: N/A DME Agency: NA       HH Arranged: NA          Social Determinants of Health (SDOH) Interventions [place]: Low Risk  (07/13/2022)  Depression (PHQ2-9): Medium Risk (04/03/2020)  Tobacco Use: Medium Risk (07/09/2022)     Readmission Risk Interventions    07/10/2022    2:52 PM  Readmission Risk Prevention Plan  Transportation Screening Complete  PCP or Specialist Appt within 3-5 Days Complete  HRI or [place] Complete  Palliative Care Screening Not Applicable  Medication Review (RN  Care Manager) Complete    Beckey Rutter, MSW, LCSWA, LCASA Transitions of Care  Clinical Social Worker I

## 2022-07-23 NOTE — Progress Notes (Addendum)
On call paged re: pt requesting additional pain medications. Repeated asking for IV pain medication. Grunting/moaning/yelling, moderate hand tremors, constantly runny nose, very irritable, rolling around in bed.   0515: Pt keeps asking for IV Dilaudid

## 2022-07-23 NOTE — Progress Notes (Signed)
Physical Therapy Treatment Patient Details Name: [name] MRN: [id number] DOB: Sep 21, 1950 Today's Date: 07/23/2022   History of Present Illness Pt is 72 y.o. male admitted 04/09/22  to the ER at [facility] with abdominal pain concerning for peritonitis, transferred to [facility]. Pt with a history of ESRD on PD, CAD, HF with preserved EF, COPD, PD, and HTN.    PT Comments    Pt tolerated treatment well today. Pt able to progress gait today by completing short bouts  of ambulation within room however pt continues to be limits by decreased activity tolerance. Pt also able to complete seated ther ex as well. No change in DC/DME recs. Pt would benefit from continued functional mobility training during acute stay.  Recommendations for follow up therapy are one component of a multi-disciplinary discharge planning process, led by the attending physician.  Recommendations may be updated based on patient status, additional functional criteria and insurance authorization.  Follow Up Recommendations  Skilled nursing-short term rehab (<3 hours/day) Can patient physically be transported by private vehicle: No   Assistance Recommended at Discharge Frequent or constant Supervision/Assistance  Patient can return home with the following Two people to help with bathing/dressing/bathroom;Assistance with cooking/housework;Assist for transportation;Help with stairs or ramp for entrance;Two people to help with walking and/or transfers;Direct supervision/assist for medications management;Direct supervision/assist for financial management   Equipment Recommendations  Hospital bed;BSC/3in1;Wheelchair (measurements PT)    Recommendations for Other Services       Precautions / Restrictions Precautions Precautions: Fall;Other (comment) Precaution Comments: Now on hemodialysis Restrictions Weight Bearing Restrictions: No     Mobility  Bed Mobility               General bed mobility comments: Pt received  up in chair    Transfers Overall transfer level: Needs assistance Equipment used: Rolling walker (2 wheels) Transfers: Sit to/from Stand Sit to Stand: Min guard           General transfer comment: Multiple STS performed. Pt continues to pant during mobility despite VSS and O2 remaining above 96% on 2L.    Ambulation/Gait Ambulation/Gait assistance: Min guard Gait Distance (Feet): 15 Feet (49f x3) Assistive device: Rolling walker (2 wheels) Gait Pattern/deviations: Step-through pattern, Decreased stride length, Trunk flexed Gait velocity: decreased     General Gait Details: Pt appears to be very anxious when ambulating despite being steady with no LOB noted.   Stairs             Wheelchair Mobility    Modified Rankin (Stroke Patients Only)       Balance Overall balance assessment: Mild deficits observed, not formally tested                                          Cognition Arousal/Alertness: Awake/alert Behavior During Therapy: Anxious, Flat affect Overall Cognitive Status: No family/caregiver present to determine baseline cognitive functioning                                          Exercises General Exercises - Lower Extremity Long Arc Quad: Both, 10 reps, Seated Hip Flexion/Marching: Both, 10 reps, Seated    General Comments General comments (skin integrity, edema, etc.): VSS on 2L. O2 remained above 96% during mobility.      Pertinent Vitals/Pain  Pain Assessment Pain Assessment: No/denies pain    Home Living                          Prior Function            PT Goals (current goals can now be found in the care plan section) Progress towards PT goals: Progressing toward goals    Frequency    Min 3X/week      PT Plan Current plan remains appropriate    Co-evaluation              AM-PAC PT "6 Clicks" Mobility   Outcome Measure  Help needed turning from your back to your side  while in a flat bed without using bedrails?: A Little Help needed moving from lying on your back to sitting on the side of a flat bed without using bedrails?: A Lot Help needed moving to and from a bed to a chair (including a wheelchair)?: A Lot Help needed standing up from a chair using your arms (e.g., wheelchair or bedside chair)?: A Lot Help needed to walk in hospital room?: A Lot Help needed climbing 3-5 steps with a railing? : Total 6 Click Score: 12    End of Session Equipment Utilized During Treatment: Gait belt Activity Tolerance: Patient tolerated treatment well Patient left: in chair;with call bell/phone within reach;with chair alarm set Nurse Communication: Mobility status PT Visit Diagnosis: Other abnormalities of gait and mobility (R26.89);Muscle weakness (generalized) (M62.81)     Time: [id number] PT Time Calculation (min) (ACUTE ONLY): 19 min  Charges:  $Therapeutic Activity: 8-22 mins                     Shelby Mattocks, PT, DPT Acute Rehab Services [id number]    Viann Shove 07/23/2022, 12:10 PM

## 2022-07-30 ENCOUNTER — Ambulatory Visit: Payer: Medicare Other | Admitting: Nurse Practitioner

## 2022-08-08 ENCOUNTER — Telehealth: Payer: Self-pay | Admitting: Cardiology

## 2022-08-08 NOTE — Telephone Encounter (Signed)
  Patient Consent for Virtual Visit        [name] has provided verbal consent on 08/08/2022 for a virtual visit (video or telephone).   CONSENT FOR VIRTUAL VISIT FOR:  [name]  By participating in this virtual visit I agree to the following:  I hereby voluntarily request, consent and authorize [facility] and its employed or contracted physicians, physician assistants, nurse practitioners or other licensed health care professionals (the Practitioner), to provide me with telemedicine health care services (the "Services") as deemed necessary by the treating Practitioner. I acknowledge and consent to receive the Services by the Practitioner via telemedicine. I understand that the telemedicine visit will involve communicating with the Practitioner through live audiovisual communication technology and the disclosure of certain medical information by electronic transmission. I acknowledge that I have been given the opportunity to request an in-person assessment or other available alternative prior to the telemedicine visit and am voluntarily participating in the telemedicine visit.  I understand that I have the right to withhold or withdraw my consent to the use of telemedicine in the course of my care at any time, without affecting my right to future care or treatment, and that the Practitioner or I may terminate the telemedicine visit at any time. I understand that I have the right to inspect all information obtained and/or recorded in the course of the telemedicine visit and may receive copies of available information for a reasonable fee.  I understand that some of the potential risks of receiving the Services via telemedicine include:  Delay or interruption in medical evaluation due to technological equipment failure or disruption; Information transmitted may not be sufficient (e.g. poor resolution of images) to allow for appropriate medical decision making by the  Practitioner; and/or  In rare instances, security protocols could fail, causing a breach of personal health information.  Furthermore, I acknowledge that it is my responsibility to provide information about my medical history, conditions and care that is complete and accurate to the best of my ability. I acknowledge that Practitioner's advice, recommendations, and/or decision may be based on factors not within their control, such as incomplete or inaccurate data provided by me or distortions of diagnostic images or specimens that may result from electronic transmissions. I understand that the practice of medicine is not an exact science and that Practitioner makes no warranties or guarantees regarding treatment outcomes. I acknowledge that a copy of this consent can be made available to me via my patient portal ([facility]), or I can request a printed copy by calling the office of [facility].    I understand that my insurance will be billed for this visit.   I have read or had this consent read to me. I understand the contents of this consent, which adequately explains the benefits and risks of the Services being provided via telemedicine.  I have been provided ample opportunity to ask questions regarding this consent and the Services and have had my questions answered to my satisfaction. I give my informed consent for the services to be provided through the use of telemedicine in my medical care

## 2022-08-08 NOTE — Telephone Encounter (Signed)
Wife states patient is being considered for palliative care and wants to have virtual visit on 3/26 instead of in-office visit at patient is in a lot of pain.

## 2022-08-08 NOTE — Telephone Encounter (Signed)
Patient's wife informed that visit has been changed to virtual and verbalized understanding of plan.

## 2022-08-12 ENCOUNTER — Telehealth: Payer: Self-pay

## 2022-08-12 NOTE — Telephone Encounter (Signed)
Patients pcp requested that you review most recent scans. He was concerned regarding renal mass. He wasn't sure since patient will be under palliative care what the next steps would be. I spoke with patients wife, she advised patient is currently waiting approval to begin palliative care through [facility].

## 2022-08-13 ENCOUNTER — Ambulatory Visit: Payer: Medicare Other | Attending: Nurse Practitioner | Admitting: Nurse Practitioner

## 2022-08-13 ENCOUNTER — Encounter: Payer: Self-pay | Admitting: Nurse Practitioner

## 2022-08-13 VITALS — BP 139/73 | HR 60 | Wt 254.0 lb

## 2022-08-13 DIAGNOSIS — I25118 Atherosclerotic heart disease of native coronary artery with other forms of angina pectoris: Secondary | ICD-10-CM

## 2022-08-13 DIAGNOSIS — Z950 Presence of cardiac pacemaker: Secondary | ICD-10-CM

## 2022-08-13 DIAGNOSIS — I739 Peripheral vascular disease, unspecified: Secondary | ICD-10-CM

## 2022-08-13 DIAGNOSIS — I480 Paroxysmal atrial fibrillation: Secondary | ICD-10-CM

## 2022-08-13 DIAGNOSIS — N186 End stage renal disease: Secondary | ICD-10-CM

## 2022-08-13 DIAGNOSIS — I214 Non-ST elevation (NSTEMI) myocardial infarction: Secondary | ICD-10-CM | POA: Diagnosis not present

## 2022-08-13 DIAGNOSIS — R0609 Other forms of dyspnea: Secondary | ICD-10-CM

## 2022-08-13 DIAGNOSIS — I723 Aneurysm of iliac artery: Secondary | ICD-10-CM

## 2022-08-13 DIAGNOSIS — I4892 Unspecified atrial flutter: Secondary | ICD-10-CM | POA: Diagnosis not present

## 2022-08-13 DIAGNOSIS — J449 Chronic obstructive pulmonary disease, unspecified: Secondary | ICD-10-CM

## 2022-08-13 DIAGNOSIS — Z992 Dependence on renal dialysis: Secondary | ICD-10-CM

## 2022-08-13 DIAGNOSIS — I714 Abdominal aortic aneurysm, without rupture, unspecified: Secondary | ICD-10-CM

## 2022-08-13 DIAGNOSIS — I495 Sick sinus syndrome: Secondary | ICD-10-CM

## 2022-08-13 DIAGNOSIS — C649 Malignant neoplasm of unspecified kidney, except renal pelvis: Secondary | ICD-10-CM

## 2022-08-13 NOTE — Patient Instructions (Signed)
Medication Instructions:  Your physician recommends that you continue on your current medications as directed. Please refer to the Current Medication list given to you today.   Labwork: none  Testing/Procedures: none  Follow-Up: Your physician recommends that you schedule a follow-up appointment in: 6-8 weeks    Any Other Special Instructions Will Be Listed Below (If Applicable).     If you need a refill on your cardiac medications before your next appointment, please call your pharmacy.   

## 2022-08-13 NOTE — Progress Notes (Addendum)
Virtual Visit via Telephone Note   Because of [name]'s co-morbid illnesses, he is at least at moderate risk for complications without adequate follow up.  This format is felt to be most appropriate for this patient at this time.  The patient did not have access to video technology/had technical difficulties with video requiring transitioning to audio format only (telephone).  All issues noted in this document were discussed and addressed.  No physical exam could be performed with this format.  Please refer to the patient's chart for his consent to telehealth for [facility].    Date:  08/13/2022  ID:  FAIZAN EDHOLM, DOB [birth year], MRN [id number] The patient was identified using 2 identifiers.  Patient Location: Home Provider Location: Office/Clinic   PCP:  Tobe Sos, [place] Prov[facility] Cardiologist:  Carlyle Dolly, MD     Evaluation Performed:  Follow-Up Visit  Chief Complaint:  Hospital follow-up  History of Present Illness:    [name] is a 72 y.o. 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  Previous CV history includes prior stent to RCA 1999.  Cardiac cath in 2003 revealed LAD 50-60%, LM 20-30%, and mid 60-70%, left circumflex with mid AV portion 60-70%, RCA with patent stent with proximal 40-50% disease.  Was medically managed. NST in 2016 was overall low risk, small area of ischemia in mid inferior to apical.  Repeat NST in 2019 was negative for ischemia.  Admitted in 2021 with NSTEMI.  Cath report noted below.  Was difficult to pursue with PCI, recommended for medical management.  GDMT limited due to Imdur causing headaches, lowered to 30 mg, insurance would not cover Ranexa.  NST in 2022  showed large inferior infarct, mild to moderate apical ischemia.  TTE showed normal EF, grade 1 DD.  Admitted in May 2023 with NSTEMI.  Cath report noted below, recommended for medical therapy.  EF normal.  Followed by vascular for carotid artery stenosis/SMA stenosis.  Last seen by Dr. Carlyle Dolly on May 06, 2022.  Was found to be off anticoagulation for proximal A-fib/A-flutter due to prior GI bleeding.  Was deemed not a candidate for watchman after EP evaluation.  Was plan for 1 year DAPT for recent NSTEMI at the time.  Overall was doing well.  Admitted for NSTEMI 06/2022 - 07/22/2022. Underwent LHC, revealed largely unchanged CAD with CTO of inferior branch of bifurcating diagonal and CTO of proximal Lcx, with moderate in-stent restenosis of proximal RCA stent with associated RFR of 0.94, moderate distal RCA lesion with RFR of 0.91, LVEDP of 18 mmHg. Medical therapy recommended. TTE revealed 60-65% EF. During hospitalization developed intermittent abdominal pain. Mesenteric ischemia r/o.  GI did not recommend upper endoscopy to be done.  It appeared that there was an enlarging right kidney mass, recommended to follow-up with urology outpatient for further evaluation.  Today he presents for telephone visit. Hx provided by patient and spouse.  Patient states chest pain has been getting better, intermittent in nature, lasting a few seconds in duration.  States he has not had to take nitroglycerin.  Admits to musculoskeletal issues along his neck.  Wife endorses that he was recently diagnosed with slow growing renal cancer, palliative care has been consulted and they are waiting on initial evaluation.  Patient does admit to dyspnea on exertion that has been stable for around 1 year, wife states he has recently recovered from pneumonia and COVID and did go around 2 weeks without PD, has returned to doing this now.  Blood pressure stable at home.  Closely is being followed by nephrologist, has upcoming  labs soon.  Also following pain management clinic for chronic pain issues.  Has returned from SNF to home, waiting on [facility] PT to work with him. Denies any other questions or concerns today.  Past Medical History:  Diagnosis Date   AAA (abdominal aortic aneurysm) ([place]) 06/2016   Mild increase in size of 3.4 cm infrarenal abdominal aortic aneurysm   Anemia    Aortic atherosclerosis (HCC)    Arthritis    Asthma    Atrial flutter (HCC)    AVF (arteriovenous fistula) (HCC)    Left forearm   CAD (coronary artery disease)    a. s/p prior stenting of RCA in 1999 b. cath in 2003 showing moderate CAD c. NST in 2016 showing small area of ischemic and low-risk   CHF (congestive heart failure) ([place])    CKD (chronic kidney disease), stage V ([place])    kidney transplant evaluation   COPD (chronic obstructive pulmonary disease) ([place])    with ongoing tobacco use and patient failed sham takes   Diverticulosis 2018   Mild sigmoid colon diverticulosis.   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    Reflux esophagitis    hx   Two-vessel coronary artery disease    moderate. by cath in 2003. Status post stenting of the mdi RCA November 1999 normal left ventricular ejection fraction   Wears glasses    Wears hearing aid    B/L   Past Surgical History:  Procedure Laterality Date   A/V FISTULAGRAM Left 04/30/2019   Procedure: A/V FISTULAGRAM;  Surgeon: Elam Dutch, MD;  Location: [place] [facility];  Service: Cardiovascular;  Laterality: Left;   A/V FISTULAGRAM Left 09/22/2019   Procedure: A/V FISTULAGRAM;  Surgeon: Marty Heck, MD;  Location: [place] [facility];  Service: Cardiovascular;  Laterality: Left;   ABDOMINAL SURGERY     APPENDECTOMY     AV FISTULA PLACEMENT Right 03/11/2018   Procedure: CREATION Brachiocephalic Fistula RIGHT ARM;  Surgeon: Rosetta Posner, MD;  Location: [facility];  Service: Vascular;  Laterality: Right;   AV FISTULA PLACEMENT Left 05/06/2019   Procedure: LEFT BRACHIOCEPHALIC ARTERIOVENOUS (AV) FISTULA CREATION;  Surgeon: Marty Heck, MD;  Location: [facility];  Service: Vascular;  Laterality: Left;   BACK SURGERY     x3; cages in patient's back since 2000   BIOPSY  11/12/2018   Procedure: BIOPSY;  Surgeon: Laurence Spates, MD;  Location: [facility];  Service: Endoscopy;;   CARDIAC CATHETERIZATION     stent   CHOLECYSTECTOMY     CLOSED  REDUCTION SHOULDER DISLOCATION     COLONOSCOPY W/ BIOPSIES AND POLYPECTOMY     COLONOSCOPY WITH PROPOFOL N/A 11/12/2018   Procedure: COLONOSCOPY WITH PROPOFOL;  Surgeon: Laurence Spates, MD;  [facility]: [facility];  Service: Endoscopy;  Laterality: N/A;   COLONOSCOPY WITH PROPOFOL N/A 10/02/2021   Procedure: COLONOSCOPY WITH PROPOFOL;  Surgeon: Clarene Essex, MD;  [facility];  Service: Gastroenterology;  Laterality: N/A;   CORONARY ANGIOPLASTY     CYSTOSCOPY WITH INSERTION OF UROLIFT N/A 11/06/2020   Procedure: CYSTOSCOPY WITH INSERTION OF UROLIFT;  Surgeon: Cleon Gustin, MD;  [facility]: [facility];  Service: Urology;  Laterality: N/A;   diabetic ulcers     DIAGNOSTIC LAPAROSCOPY     DIALYSIS/PERMA CATHETER REMOVAL N/A 09/22/2019   Procedure: DIALYSIS/PERMA CATHETER REMOVAL;  Surgeon: Marty Heck, MD;  Location: [place] [facility];  Service: Cardiovascular;  Laterality: N/A;   ESOPHAGOGASTRODUODENOSCOPY (EGD) WITH PROPOFOL N/A 11/12/2018   Procedure: ESOPHAGOGASTRODUODENOSCOPY (EGD) WITH  PROPOFOL;  Surgeon: Laurence Spates, MD;  [facility];  Service: Endoscopy;  Laterality: N/A;   GROIN EXPLORATION     HEMOSTASIS CLIP PLACEMENT  11/12/2018   Procedure: HEMOSTASIS CLIP PLACEMENT;  Surgeon: Laurence Spates, MD;  [facility];  Service: Endoscopy;;   HIP ARTHROPLASTY Left    gamma nail   INSERT / REPLACE / [place] (AV) ARTEGRAFT ARM Left 03/18/2018   Procedure: INSERTION OF ARTERIOVENOUS (AV) FISTULA;  Surgeon: Rosetta Posner, MD;  Location: [place];  Service: Vascular;  Laterality: Left;   INTRAVASCULAR PRESSURE WIRE/FFR STUDY N/A 07/08/2022   Procedure: INTRAVASCULAR PRESSURE WIRE/FFR STUDY;  Surgeon: [name], MD;  Location: [place] [facility];  Service: Cardiovascular;  Laterality: N/A;   IR FLUORO GUIDE CV LINE RIGHT  04/11/2019   IR US GUIDE VASC ACCESS RIGHT  04/11/2019   LEFT HEART CATH AND CORONARY ANGIOGRAPHY N/A 07/23/2019   Procedure: LEFT HEART CATH AND CORONARY ANGIOGRAPHY;  Surgeon: Martinique, Peter M, MD;  Location: [place] [facility];  Service: Cardiovascular;  Laterality: N/A;   LEFT HEART CATH AND CORONARY ANGIOGRAPHY N/A 10/10/2021   Procedure: LEFT HEART CATH AND CORONARY ANGIOGRAPHY;  Surgeon: [name], MD;  Location: [place] [facility];  Service: Cardiovascular;  Laterality: N/A;   LEFT HEART CATH AND CORONARY ANGIOGRAPHY N/A 07/08/2022   Procedure: LEFT HEART CATH AND CORONARY ANGIOGRAPHY;  Surgeon: [name], MD;  Location: [place] [facility];  Service: Cardiovascular;  Laterality: N/A;   LIGATION ARTERIOVENOUS GORTEX GRAFT Right 03/18/2018   Procedure: LIGATION ARTERIOVENOUS FISTULA;  Surgeon: Rosetta Posner, MD;  [facility];  Service: Vascular;  Laterality: Right;   OSTECTOMY Left 04/23/2017   Procedure: OSTECTOMY LEFT GREAT TOE;  Surgeon: [name], DPM;  [facility];  Service: Podiatry;  Laterality: Left;  left great toe   POLYPECTOMY  11/12/2018   Procedure:  POLYPECTOMY;  Surgeon: Laurence Spates, MD;  [facility];  Service: Endoscopy;;   POLYPECTOMY  10/02/2021   Procedure: POLYPECTOMY;  Surgeon: Clarene Essex, MD;  Location: [facility];  Service: Gastroenterology;;   SPINAL CORD STIMULATOR INSERTION N/A 12/20/2020   Procedure: Permanent Spinal Cord Stimulator  Lead Implant with Fluoroscopy and Battery Implant;  Surgeon: Reece Agar, MD;  Location: [place];  Service: Neurosurgery;  Laterality: N/A;   TUMOR REMOVAL     small intestine x3   VISCERAL ANGIOGRAPHY N/A 08/02/2019   Procedure: VISCERAL ANGIOGRAPHY;  Surgeon: Donzetta Matters,  Georgia Dom, MD;  Location: [place] [facility];  Service: Cardiovascular;  Laterality: N/A;     Current Meds  Medication Sig   acetaminophen (TYLENOL) 325 MG tablet Take 650 mg by mouth every 6 (six) hours as needed for mild pain or moderate pain.   albuterol (VENTOLIN HFA) 108 (90 Base) MCG/ACT inhaler Inhale 2 puffs into the lungs every 6 (six) hours as needed for wheezing or shortness of breath.    aspirin EC 81 MG EC tablet Take 1 tablet (81 mg total) by mouth daily.   atorvastatin (LIPITOR) 40 MG tablet Take 1 tablet by mouth once daily (Patient taking differently: Take 40 mg by mouth every evening.)   beclomethasone (QVAR) 80 MCG/ACT inhaler Inhale 2 puffs into the lungs daily as needed (wheezing and shortness of breath).   bumetanide (BUMEX) 2 MG tablet Take 2 mg by mouth 2 (two) times daily.   CALCIUM CITRATE PO Take 600 mg by mouth in the morning and at bedtime.   carvedilol (COREG) 12.5 MG tablet Take 1 tablet (12.5 mg total) by mouth 2 (two) times daily with a meal.   Cholecalciferol (VITAMIN D3) 2000 units TABS Take 2,000 Units by mouth daily.    clobetasol (TEMOVATE) 0.05 % external solution Apply 1 application topically 2 (two) times daily as needed (itching).   denosumab (PROLIA) 60 MG/ML SOSY injection Inject 60 mg into the skin every 6 (six) months.   gentamicin cream (GARAMYCIN) 0.1 % Apply  1 Application topically daily.   HYDROmorphone (DILAUDID) 2 MG tablet Take 0.5 tablets (1 mg total) by mouth every 6 (six) hours as needed for severe pain.   isosorbide mononitrate (IMDUR) 60 MG 24 hr tablet Take 60 mg by mouth daily.   linaclotide (LINZESS) 290 MCG CAPS capsule Take 145-290 mcg by mouth daily as needed (Constipation).   Melatonin 10 MG TBDP Take 10 mg by mouth daily.   multivitamin (RENA-VIT) TABS tablet Take 1 tablet by mouth daily.   nitroGLYCERIN (NITROSTAT) 0.4 MG SL tablet Place 1 tablet (0.4 mg total) under the tongue every 5 (five) minutes as needed for chest pain.   pantoprazole (PROTONIX) 40 MG tablet Take 1 tablet (40 mg total) by mouth daily.   PLAVIX 75 MG tablet Take 75 mg by mouth daily.   polyethylene glycol (MIRALAX / GLYCOLAX) 17 g packet Take 17 g by mouth daily as needed for mild constipation or moderate constipation.   potassium chloride SA (KLOR-CON M) 20 MEQ tablet Take 20 mEq by mouth every other day.   risperiDONE (RISPERDAL) 0.5 MG tablet Take 1 tablet (0.5 mg total) by mouth at bedtime.   rOPINIRole (REQUIP) 1 MG tablet Take 1 tablet (1 mg total) by mouth at bedtime.   sertraline (ZOLOFT) 25 MG tablet Take 25 mg by mouth in the morning.     Allergies:   Hydrocodone, Hydrocodone-acetaminophen, Other, and Oxycodone-acetaminophen   Social History   Tobacco Use   Smoking status: Former    Years: 55    Types: Cigarettes    Start date: 05/20/1968    Quit date: 11/30/2019    Years since quitting: 2.7    Passive exposure: Never   Smokeless tobacco: Never  Vaping Use   Vaping Use: Never used  Substance Use Topics   Alcohol use: Not Currently    Alcohol/week: 0.0 standard drinks of alcohol    Comment: rare   Drug use: No     Family Hx: The patient's family history includes Alcohol abuse  in his father; Diabetes in his mother.  ROS:   Please see the history of present illness.    All other systems reviewed and are negative.   Prior CV  studies:   The following studies were reviewed today:  Echo 07/2022: 1. Left ventricular ejection fraction, by estimation, is 60 to 65%. The  left ventricle has normal function. The left ventricle has no regional  wall motion abnormalities. There is moderate left ventricular hypertrophy.  Left ventricular diastolic  parameters are indeterminate.   2. Right ventricular systolic function is mildly reduced. The right  ventricular size is normal.   3. The mitral valve is degenerative. Trivial mitral valve regurgitation.  No evidence of mitral stenosis. Moderate mitral annular calcification.   4. The aortic valve was not well visualized. Aortic valve regurgitation  is not visualized. Aortic valve sclerosis/calcification is present,  without any evidence of aortic stenosis.  LHC 07/08/2022:   Mid LM to Dist LM lesion is 25% stenosed.   Mid LAD lesion is 40% stenosed.   Ost Cx to Prox Cx lesion is 100% stenosed.   Prox RCA lesion is 50% stenosed.   Dist RCA lesion is 60% stenosed.   1st Diag-2 lesion is 100% stenosed.   1st Diag-1 lesion is 80% stenosed.   1.  Largely unchanged coronary artery disease with chronic total occlusion of inferior branch of bifurcating diagonal and chronic total occlusion of the proximal left circumflex.   2.  Moderate in-stent restenosis of proximal right coronary artery stent with an associated RFR of 0.94. 3.  Moderate distal right coronary artery lesion with an RFR of 0.91. 4.  LVEDP of 18 mmHg.   Recommendation: The images and RFR data were reviewed with Dr. Martinique the team C attending; medical therapy will be pursued.  Labs/Other Tests and Data Reviewed:    EKG:  An ECG dated 07/18/2022 was personally reviewed today and demonstrated:  V-paced rhythm, 70 bpm.   Recent Labs: 04/10/2022: B Natriuretic Peptide 766.3; TSH 1.148 07/08/2022: ALT 16 07/23/2022: BUN 26; Creatinine, Ser 4.81; Hemoglobin 9.5; Magnesium 2.7; Platelets 156; Potassium 3.8; Sodium 131    Recent Lipid Panel Lab Results  Component Value Date/Time   CHOL 95 07/10/2022 01:00 AM   TRIG 124 07/10/2022 01:00 AM   HDL 38 (L) 07/10/2022 01:00 AM   CHOLHDL 2.5 07/10/2022 01:00 AM   LDLCALC 32 07/10/2022 01:00 AM    Wt Readings from Last 3 Encounters:  08/13/22 254 lb (115.2 kg)  07/22/22 266 lb 8.6 oz (120.9 kg)  04/14/22 263 lb 7.2 oz (119.5 kg)     Risk Assessment/Calculations:    CHA2DS2-VASc Score = 2  This indicates a 2.2% annual risk of stroke. The patient's score is based upon: CHF History: 0 HTN History: 0 Diabetes History: 0 Stroke History: 0 Vascular Disease History: 1 Age Score: 1 Gender Score: 0  Objective:    Vital Signs:  BP 139/73   Pulse 60   Wt 254 lb (115.2 kg)   BMI 30.92 kg/m    VITAL SIGNS:  reviewed  Due to nature of today's telehealth visit, unable to perform physical exam.   ASSESSMENT & PLAN:    CAD, s/p NSTEMI Recent NSTEMI - see cath report above. Admits chest pain has been getting better since medication adjusted in hospital, intermittent in nature, lasting a few seconds in duration, denies having to take nitroglycerin. In the past, headaches caused with increased dosage of Imdur. Currently tolerating 60 mg well per his  report. Continue Aspirin, plavix, Coreg, Imdur, atorvastatin, and NG PRN. If no improvement/resolution in symptoms by next f/u, consider/discuss Amlodipine/increasing Imdur to 90 mg if able to tolerate / BP tolerates.  Heart healthy diet encouraged. ED precautions discussed. Continue to follow-up with palliative care.   PAF/A-flutter; Tachybrady syndrome, s/p PPM Denies any tachycardia or palpitations. Last EKG showed device functioning normal. Remote device check 05/2022 showed normal function. Continue Coreg. Not on AC d/t hx of prior GI bleed, previously was not deemed to be good candidate for watchman. Continue current medication regimen. Continue to follow-up with EP.  COPD, DOE Stable, chronic DOE r/t COPD.  Continue current medication regimen. Recent Echo showed normal EF. Continue to follow with PCP.  AAA, iliac artery aneurysm Stable, mild aneurysm dilation of abdominal aorta around 3.7 cm with stable mild aneurysm dilation of right common iliac artery noted on CT angio 07/2022. Recommended to follow-up every 2 years. Pt denies any symptoms. Recommend discussing repeating imaging in 2026. Heart healthy diet encouraged.   ESRD on PD, renal cancer Avoid nephrotoxic agents. Continue PD as scheduled and follow-up with nephrology/urology as scheduled. Recently diagnosed with renal cancer per wife's report - sees urology. Continue to follow-up with palliative care.   PAD Denies any symptoms. Continue to follow-up with Vascular surgery and PCP as scheduled. Continue current medication regimen. Heart healthy diet encouraged.     Time:   Today, I have spent 25 minutes with the patient with telehealth technology discussing the above problems.     Medication Adjustments/Labs and Tests Ordered: Current medicines are reviewed at length with the patient today.  Concerns regarding medicines are outlined above.   Tests Ordered: No orders of the defined types were placed in this encounter.   Medication Changes: No orders of the defined types were placed in this encounter.   Follow Up:  Virtual Visit  in 6-8 weeks  Signed, Finis Bud, NP  08/18/2022 11:36 AM    [place]

## 2022-08-13 NOTE — Progress Notes (Signed)
  Patient Consent for Virtual Visit       [name] has provided verbal consent on 08/13/2022 for a virtual visit (video or telephone).   CONSENT FOR VIRTUAL VISIT FOR:  [name]  By participating in this virtual visit I agree to the following:  I hereby voluntarily request, consent and authorize [facility] and its employed or contracted physicians, physician assistants, nurse practitioners or other licensed health care professionals (the Practitioner), to provide me with telemedicine health care services (the "Services") as deemed necessary by the treating Practitioner. I acknowledge and consent to receive the Services by the Practitioner via telemedicine. I understand that the telemedicine visit will involve communicating with the Practitioner through live audiovisual communication technology and the disclosure of certain medical information by electronic transmission. I acknowledge that I have been given the opportunity to request an in-person assessment or other available alternative prior to the telemedicine visit and am voluntarily participating in the telemedicine visit.  I understand that I have the right to withhold or withdraw my consent to the use of telemedicine in the course of my care at any time, without affecting my right to future care or treatment, and that the Practitioner or I may terminate the telemedicine visit at any time. I understand that I have the right to inspect all information obtained and/or recorded in the course of the telemedicine visit and may receive copies of available information for a reasonable fee.  I understand that some of the potential risks of receiving the Services via telemedicine include:  Delay or interruption in medical evaluation due to technological equipment failure or disruption; Information transmitted may not be sufficient (e.g. poor resolution of images) to allow for appropriate medical decision making by the Practitioner;  and/or  In rare instances, security protocols could fail, causing a breach of personal health information.  Furthermore, I acknowledge that it is my responsibility to provide information about my medical history, conditions and care that is complete and accurate to the best of my ability. I acknowledge that Practitioner's advice, recommendations, and/or decision may be based on factors not within their control, such as incomplete or inaccurate data provided by me or distortions of diagnostic images or specimens that may result from electronic transmissions. I understand that the practice of medicine is not an exact science and that Practitioner makes no warranties or guarantees regarding treatment outcomes. I acknowledge that a copy of this consent can be made available to me via my patient portal ([facility]), or I can request a printed copy by calling the office of [facility].    I understand that my insurance will be billed for this visit.   I have read or had this consent read to me. I understand the contents of this consent, which adequately explains the benefits and risks of the Services being provided via telemedicine.  I have been provided ample opportunity to ask questions regarding this consent and the Services and have had my questions answered to my satisfaction. I give my informed consent for the services to be provided through the use of telemedicine in my medical care

## 2022-08-17 ENCOUNTER — Encounter: Payer: Self-pay | Admitting: Cardiology

## 2022-08-19 ENCOUNTER — Other Ambulatory Visit: Payer: Self-pay | Admitting: *Deleted

## 2022-08-19 MED ORDER — CARVEDILOL 12.5 MG PO TABS: 12.5000 mg | ORAL_TABLET | Freq: Two times a day (BID) | ORAL | 1 refills | Status: DC

## 2022-08-19 MED ORDER — ISOSORBIDE MONONITRATE ER 60 MG PO TB24: 60.0000 mg | ORAL_TABLET | Freq: Every day | ORAL | 1 refills | Status: DC

## 2022-08-21 ENCOUNTER — Telehealth: Payer: Self-pay | Admitting: Cardiology

## 2022-08-21 NOTE — Telephone Encounter (Signed)
Pt c/o of Chest Pain: STAT if CP now or developed within 24 hours  1. Are you having CP right now? "Not really"   2. Are you experiencing any other symptoms (ex. SOB, nausea, vomiting, sweating)? Very dizzy right now and some SOB coming and going  3. How long have you been experiencing CP? Started this morning around 8 or 9  4. Is your CP continuous or coming and going? Coming and going  5. Have you taken Nitroglycerin? No    ?

## 2022-08-21 NOTE — Telephone Encounter (Signed)
Wife called stating that this morning the patient woke c/o of chest pains, dizziness and SOB. Took BP this morning and it was 87/40 P 70. Chest pains have been intermittent with a rate of 7/10. Pains went away after laying down but started back once he got up. Feels as if he is unable to catch his breath. States that he took all of his cardiac medications today but did not take nitro since his BP was low. Took BP again around 2 pm and it was 107/66. Wife states that she tried to call the ambulance but patient refused. Informed the wife that I would send message to Dr. [name] but if symptoms get any worse, he will need to call 911 or go to the ER. Wife states that she can not forcce the patient to go to the ER but she will try if she has to. Please advise.

## 2022-08-21 NOTE — Telephone Encounter (Signed)
Wife made aware, states that patient is on the way to the ER via EMS.

## 2022-08-21 NOTE — Telephone Encounter (Signed)
Needs ER evaluation if chest pains and low blood pressure. Office evaluation would be insufficent given symptoms.   Zandra Abts MD

## 2022-08-21 NOTE — Telephone Encounter (Signed)
Wife called back stating that EMS has been called and wanted to know what Dr. [name] wants patient to do since she has not received a call back. Informed patient that Dr. [name] is in the clinic seeing patients and that her message will be addressed as soon as he is able to see it. EMS is there to further evaluate patient.

## 2022-08-21 NOTE — Telephone Encounter (Signed)
Patient's wife is requesting a call back from the nurse to discuss further.

## 2022-08-27 ENCOUNTER — Encounter: Payer: Self-pay | Admitting: *Deleted

## 2022-08-27 MED ORDER — ISOSORBIDE MONONITRATE ER 60 MG PO TB24: 90.0000 mg | ORAL_TABLET | Freq: Every day | ORAL | 2 refills | Status: DC

## 2022-08-27 NOTE — Telephone Encounter (Addendum)
Reports chest pain continues. Reports active chest pain rated 7/10. Reports SOB and dizziness. Currently on 2 L O2 at home. Per wife [name], patient did go to [facility] last Wednesday for chest pain evaluation. No medication changes made. Will request records. Medication reviewed. Per wife, seen by [facility] Nurse last night for first visit and was told that he shouldn't be taking plavix, but to continue all other medications. Per wife, PCP made hospice referral. Has not checked HR or BP today. BP 103/64 & HR 72. Advised that message would be sent to provider and if he developed worsening symptoms, to go back to the ED for an evaluation.

## 2022-08-27 NOTE — Telephone Encounter (Signed)
Per Dr. Sheran Fava no headaches on imdur would try increasing to 90mg  daily   no one from our team stopped plavix, he was on it at his last visit with [place] last month. unless somethng happened [place] [facility] where they wanted him to stop it. the hospice team would need to clarify what they are talking about

## 2022-08-27 NOTE — Addendum Note (Signed)
Addended by: Eustace Moore on: 08/27/2022 01:10 PM   Modules accepted: Orders

## 2022-08-27 NOTE — Telephone Encounter (Signed)
Patient's wife is calling with update that the medication Imdur medication does not seem to be helping and was told to call if this was not resolved. Please advise.

## 2022-08-27 NOTE — Telephone Encounter (Signed)
Patient's wife Bonita Quin informed and verbalized understanding of plan.

## 2022-09-11 ENCOUNTER — Other Ambulatory Visit: Payer: Self-pay | Admitting: Cardiology

## 2022-09-18 DEATH — deceased

## 2022-10-08 ENCOUNTER — Telehealth: Payer: Medicare Other | Admitting: Nurse Practitioner

## 2022-10-25 ENCOUNTER — Encounter: Payer: Medicare Other | Admitting: Internal Medicine

## 2022-11-13 ENCOUNTER — Ambulatory Visit: Payer: Medicare Other | Admitting: Cardiology

## 2022-11-22 ENCOUNTER — Encounter: Payer: Medicare Other | Admitting: Cardiovascular Disease
